# Patient Record
Sex: Male | Born: 1945
Health system: Southern US, Community
[De-identification: ages and names within clinical notes are randomized; demographics above are authoritative.]

## PROBLEM LIST (undated history)

## (undated) DIAGNOSIS — I10 Essential (primary) hypertension: Secondary | ICD-10-CM

## (undated) DIAGNOSIS — I82409 Acute embolism and thrombosis of unspecified deep veins of unspecified lower extremity: Secondary | ICD-10-CM

## (undated) DIAGNOSIS — O2230 Deep phlebothrombosis in pregnancy, unspecified trimester: Secondary | ICD-10-CM

## (undated) DIAGNOSIS — Z7901 Long term (current) use of anticoagulants: Secondary | ICD-10-CM

## (undated) DIAGNOSIS — I38 Endocarditis, valve unspecified: Secondary | ICD-10-CM

## (undated) DIAGNOSIS — I714 Abdominal aortic aneurysm, without rupture: Secondary | ICD-10-CM

## (undated) DIAGNOSIS — R0602 Shortness of breath: Secondary | ICD-10-CM

## (undated) DIAGNOSIS — D6861 Antiphospholipid syndrome: Secondary | ICD-10-CM

## (undated) DIAGNOSIS — G4733 Obstructive sleep apnea (adult) (pediatric): Secondary | ICD-10-CM

## (undated) DIAGNOSIS — I2510 Atherosclerotic heart disease of native coronary artery without angina pectoris: Secondary | ICD-10-CM

## (undated) DIAGNOSIS — I5022 Chronic systolic (congestive) heart failure: Secondary | ICD-10-CM

## (undated) DIAGNOSIS — I5020 Unspecified systolic (congestive) heart failure: Secondary | ICD-10-CM

## (undated) DIAGNOSIS — I429 Cardiomyopathy, unspecified: Secondary | ICD-10-CM

## (undated) DIAGNOSIS — K219 Gastro-esophageal reflux disease without esophagitis: Secondary | ICD-10-CM

## (undated) DIAGNOSIS — I499 Cardiac arrhythmia, unspecified: Secondary | ICD-10-CM

## (undated) DIAGNOSIS — N400 Enlarged prostate without lower urinary tract symptoms: Secondary | ICD-10-CM

## (undated) DIAGNOSIS — M1990 Unspecified osteoarthritis, unspecified site: Secondary | ICD-10-CM

## (undated) DIAGNOSIS — M069 Rheumatoid arthritis, unspecified: Secondary | ICD-10-CM

## (undated) DIAGNOSIS — C9110 Chronic lymphocytic leukemia of B-cell type not having achieved remission: Secondary | ICD-10-CM

## (undated) DIAGNOSIS — Z8719 Personal history of other diseases of the digestive system: Secondary | ICD-10-CM

## (undated) DIAGNOSIS — Z8619 Personal history of other infectious and parasitic diseases: Secondary | ICD-10-CM

## (undated) DIAGNOSIS — J189 Pneumonia, unspecified organism: Secondary | ICD-10-CM

## (undated) DIAGNOSIS — I700 Atherosclerosis of aorta: Secondary | ICD-10-CM

## (undated) DIAGNOSIS — I509 Heart failure, unspecified: Secondary | ICD-10-CM

## (undated) DIAGNOSIS — I739 Peripheral vascular disease, unspecified: Secondary | ICD-10-CM

## (undated) DIAGNOSIS — R7303 Prediabetes: Secondary | ICD-10-CM

## (undated) DIAGNOSIS — K317 Polyp of stomach and duodenum: Secondary | ICD-10-CM

## (undated) DIAGNOSIS — K2270 Barrett's esophagus without dysplasia: Secondary | ICD-10-CM

## (undated) DIAGNOSIS — E785 Hyperlipidemia, unspecified: Secondary | ICD-10-CM

## (undated) DIAGNOSIS — E538 Deficiency of other specified B group vitamins: Secondary | ICD-10-CM

## (undated) DIAGNOSIS — I214 Non-ST elevation (NSTEMI) myocardial infarction: Secondary | ICD-10-CM

## (undated) DIAGNOSIS — I219 Acute myocardial infarction, unspecified: Secondary | ICD-10-CM

## (undated) DIAGNOSIS — I4891 Unspecified atrial fibrillation: Secondary | ICD-10-CM

## (undated) HISTORY — DX: Chronic lymphocytic leukemia of B-cell type not having achieved remission: C91.10

## (undated) HISTORY — DX: Unspecified osteoarthritis, unspecified site: M19.90

## (undated) HISTORY — DX: Polyp of stomach and duodenum: K31.7

## (undated) HISTORY — DX: Deficiency of other specified B group vitamins: E53.8

## (undated) HISTORY — DX: Rheumatoid arthritis, unspecified: M06.9

## (undated) HISTORY — DX: Acute embolism and thrombosis of unspecified deep veins of unspecified lower extremity: I82.409

## (undated) HISTORY — PX: CORONARY ARTERY BYPASS GRAFT: SHX141

## (undated) HISTORY — DX: Shortness of breath: R06.02

## (undated) HISTORY — DX: Gastro-esophageal reflux disease without esophagitis: K21.9

## (undated) HISTORY — DX: Non-ST elevation (NSTEMI) myocardial infarction: I21.4

## (undated) HISTORY — PX: CHOLECYSTECTOMY: SHX55

## (undated) HISTORY — PX: INGUINAL HERNIA REPAIR: SUR1180

## (undated) HISTORY — DX: Essential (primary) hypertension: I10

## (undated) HISTORY — DX: Heart failure, unspecified: I50.9

## (undated) HISTORY — DX: Atherosclerotic heart disease of native coronary artery without angina pectoris: I25.10

## (undated) HISTORY — PX: HERNIA REPAIR: SHX51

## (undated) HISTORY — DX: Obstructive sleep apnea (adult) (pediatric): G47.33

## (undated) HISTORY — DX: Acute myocardial infarction, unspecified: I21.9

## (undated) HISTORY — DX: Peripheral vascular disease, unspecified: I73.9

## (undated) HISTORY — PX: ESOPHAGOGASTRODUODENOSCOPY: SHX1529

## (undated) HISTORY — DX: Barrett's esophagus without dysplasia: K22.70

## (undated) HISTORY — PX: ABDOMINAL AORTA STENT: SHX1108

## (undated) HISTORY — DX: Deep phlebothrombosis in pregnancy, unspecified trimester: O22.30

## (undated) HISTORY — DX: Abdominal aortic aneurysm, without rupture: I71.4

## (undated) HISTORY — DX: Chronic systolic (congestive) heart failure: I50.22

## (undated) HISTORY — DX: Hyperlipidemia, unspecified: E78.5

---

## 1995-11-14 HISTORY — PX: LEFT HEART CATH AND CORONARY ANGIOGRAPHY: CATH118249

## 2002-01-07 HISTORY — PX: CORONARY ARTERY BYPASS GRAFT: SHX141

## 2002-01-21 DIAGNOSIS — Z951 Presence of aortocoronary bypass graft: Secondary | ICD-10-CM

## 2002-01-21 HISTORY — PX: CORONARY ARTERY BYPASS GRAFT: SHX141

## 2002-01-21 HISTORY — DX: Presence of aortocoronary bypass graft: Z95.1

## 2002-11-23 ENCOUNTER — Other Ambulatory Visit: Payer: Self-pay

## 2004-01-03 ENCOUNTER — Ambulatory Visit: Payer: Self-pay | Admitting: Internal Medicine

## 2004-02-09 ENCOUNTER — Ambulatory Visit: Payer: Self-pay | Admitting: Internal Medicine

## 2004-06-28 ENCOUNTER — Ambulatory Visit: Payer: Self-pay | Admitting: Unknown Physician Specialty

## 2005-02-14 ENCOUNTER — Ambulatory Visit: Payer: Self-pay | Admitting: Internal Medicine

## 2006-02-25 ENCOUNTER — Ambulatory Visit: Payer: Self-pay | Admitting: Internal Medicine

## 2009-11-24 ENCOUNTER — Ambulatory Visit: Payer: Self-pay | Admitting: Unknown Physician Specialty

## 2009-11-24 HISTORY — PX: COLONOSCOPY: SHX174

## 2009-11-28 LAB — PATHOLOGY REPORT

## 2010-03-21 ENCOUNTER — Inpatient Hospital Stay: Payer: Self-pay | Admitting: Internal Medicine

## 2010-06-06 ENCOUNTER — Ambulatory Visit: Payer: Self-pay | Admitting: Internal Medicine

## 2010-06-27 ENCOUNTER — Ambulatory Visit: Payer: Self-pay | Admitting: Internal Medicine

## 2011-01-08 DIAGNOSIS — Z8619 Personal history of other infectious and parasitic diseases: Secondary | ICD-10-CM

## 2011-01-08 HISTORY — DX: Personal history of other infectious and parasitic diseases: Z86.19

## 2011-07-09 ENCOUNTER — Inpatient Hospital Stay: Payer: Self-pay | Admitting: Vascular Surgery

## 2011-07-09 LAB — URINALYSIS, COMPLETE
Glucose,UR: NEGATIVE mg/dL (ref 0–75)
Nitrite: NEGATIVE
Ph: 7 (ref 4.5–8.0)
Protein: NEGATIVE
RBC,UR: 2 /HPF (ref 0–5)
Specific Gravity: 1.023 (ref 1.003–1.030)
WBC UR: 1 /HPF (ref 0–5)

## 2011-07-09 LAB — CBC
HGB: 16.8 g/dL (ref 13.0–18.0)
MCH: 30.3 pg (ref 26.0–34.0)
MCV: 91 fL (ref 80–100)
Platelet: 169 10*3/uL (ref 150–440)
RBC: 5.52 10*6/uL (ref 4.40–5.90)
RDW: 14.2 % (ref 11.5–14.5)

## 2011-07-09 LAB — COMPREHENSIVE METABOLIC PANEL
Albumin: 3.8 g/dL (ref 3.4–5.0)
Alkaline Phosphatase: 69 U/L (ref 50–136)
Anion Gap: 8 (ref 7–16)
BUN: 17 mg/dL (ref 7–18)
Calcium, Total: 8.8 mg/dL (ref 8.5–10.1)
Co2: 29 mmol/L (ref 21–32)
EGFR (African American): >60
Glucose: 108 mg/dL — ABNORMAL HIGH (ref 65–99)
Osmolality: 280 (ref 275–301)
Potassium: 3.9 mmol/L (ref 3.5–5.1)
SGOT(AST): 32 U/L (ref 15–37)
SGPT (ALT): 31 U/L

## 2011-07-10 LAB — CBC WITH DIFFERENTIAL/PLATELET
Basophil #: 0.1 10*3/uL (ref 0.0–0.1)
Eosinophil #: 0.1 10*3/uL (ref 0.0–0.7)
Eosinophil %: 1.7 %
HCT: 45.2 % (ref 40.0–52.0)
Lymphocyte #: 2.5 10*3/uL (ref 1.0–3.6)
Lymphocyte %: 43.3 %
MCH: 30.3 pg (ref 26.0–34.0)
MCHC: 33.2 g/dL (ref 32.0–36.0)
Monocyte #: 0.5 x10 3/mm (ref 0.2–1.0)
Platelet: 148 10*3/uL — ABNORMAL LOW (ref 150–440)
RDW: 14.7 % — ABNORMAL HIGH (ref 11.5–14.5)

## 2011-07-10 LAB — COMPREHENSIVE METABOLIC PANEL
Albumin: 3.4 g/dL (ref 3.4–5.0)
Alkaline Phosphatase: 71 U/L (ref 50–136)
Anion Gap: 5 — ABNORMAL LOW (ref 7–16)
BUN: 11 mg/dL (ref 7–18)
Calcium, Total: 8.2 mg/dL — ABNORMAL LOW (ref 8.5–10.1)
Co2: 31 mmol/L (ref 21–32)
Creatinine: 1.12 mg/dL (ref 0.60–1.30)
Glucose: 91 mg/dL (ref 65–99)
Potassium: 3.9 mmol/L (ref 3.5–5.1)
SGOT(AST): 39 U/L — ABNORMAL HIGH (ref 15–37)
SGPT (ALT): 32 U/L
Sodium: 138 mmol/L (ref 136–145)
Total Protein: 6.8 g/dL (ref 6.4–8.2)

## 2011-07-10 LAB — MAGNESIUM: Magnesium: 1.9 mg/dL

## 2011-07-11 LAB — CBC WITH DIFFERENTIAL/PLATELET
Basophil %: 0.7 %
Eosinophil #: 0 10*3/uL (ref 0.0–0.7)
Eosinophil %: 0.1 %
HGB: 14.9 g/dL (ref 13.0–18.0)
Lymphocyte %: 19.5 %
MCHC: 33.9 g/dL (ref 32.0–36.0)
MCV: 91 fL (ref 80–100)
Monocyte %: 8.2 %
Neutrophil #: 6.4 10*3/uL (ref 1.4–6.5)
Neutrophil %: 71.5 %
RBC: 4.82 10*6/uL (ref 4.40–5.90)
RDW: 14.2 % (ref 11.5–14.5)
WBC: 9 10*3/uL (ref 3.8–10.6)

## 2011-07-11 LAB — BASIC METABOLIC PANEL
BUN: 11 mg/dL (ref 7–18)
Chloride: 103 mmol/L (ref 98–107)
Creatinine: 1.15 mg/dL (ref 0.60–1.30)
EGFR (Non-African Amer.): >60
Osmolality: 275 (ref 275–301)
Sodium: 138 mmol/L (ref 136–145)

## 2011-07-19 ENCOUNTER — Emergency Department: Payer: Self-pay | Admitting: Unknown Physician Specialty

## 2012-03-23 ENCOUNTER — Ambulatory Visit: Payer: Self-pay | Admitting: Unknown Physician Specialty

## 2012-03-26 LAB — PATHOLOGY REPORT

## 2013-01-07 HISTORY — PX: CORONARY ANGIOPLASTY WITH STENT PLACEMENT: SHX49

## 2013-05-02 ENCOUNTER — Inpatient Hospital Stay: Payer: Self-pay | Admitting: Internal Medicine

## 2013-05-02 DIAGNOSIS — I214 Non-ST elevation (NSTEMI) myocardial infarction: Secondary | ICD-10-CM

## 2013-05-02 HISTORY — DX: Non-ST elevation (NSTEMI) myocardial infarction: I21.4

## 2013-05-02 LAB — CBC
HCT: 49.1 % (ref 40.0–52.0)
HGB: 16.5 g/dL (ref 13.0–18.0)
MCH: 30 pg (ref 26.0–34.0)
MCHC: 33.5 g/dL (ref 32.0–36.0)
MCV: 90 fL (ref 80–100)
Platelet: 206 10*3/uL (ref 150–440)
RBC: 5.48 10*6/uL (ref 4.40–5.90)
RDW: 15.1 % — ABNORMAL HIGH (ref 11.5–14.5)
WBC: 6.7 10*3/uL (ref 3.8–10.6)

## 2013-05-02 LAB — BASIC METABOLIC PANEL
Anion Gap: 7 (ref 7–16)
BUN: 15 mg/dL (ref 7–18)
Calcium, Total: 8.6 mg/dL (ref 8.5–10.1)
Chloride: 104 mmol/L (ref 98–107)
Co2: 26 mmol/L (ref 21–32)
Creatinine: 0.66 mg/dL (ref 0.60–1.30)
EGFR (African American): >60
EGFR (Non-African Amer.): >60
GLUCOSE: 123 mg/dL — AB (ref 65–99)
Osmolality: 276 (ref 275–301)
Potassium: 3.5 mmol/L (ref 3.5–5.1)
SODIUM: 137 mmol/L (ref 136–145)

## 2013-05-02 LAB — CK-MB
CK-MB: 6.6 ng/mL — ABNORMAL HIGH (ref 0.5–3.6)
CK-MB: 10.1 ng/mL — AB (ref 0.5–3.6)
CK-MB: 9.9 ng/mL — ABNORMAL HIGH (ref 0.5–3.6)

## 2013-05-02 LAB — APTT
ACTIVATED PTT: 29.3 s (ref 23.6–35.9)
Activated PTT: 58.2 secs — ABNORMAL HIGH (ref 23.6–35.9)
Activated PTT: 56.4 secs — ABNORMAL HIGH (ref 23.6–35.9)

## 2013-05-02 LAB — PRO B NATRIURETIC PEPTIDE: B-TYPE NATIURETIC PEPTID: 137 pg/mL — AB (ref 0–125)

## 2013-05-02 LAB — TROPONIN I
TROPONIN-I: 2.2 ng/mL — AB
TROPONIN-I: 2.1 ng/mL — AB
Troponin-I: 0.92 ng/mL — ABNORMAL HIGH

## 2013-05-02 LAB — PROTIME-INR
INR: 1
Prothrombin Time: 13 secs (ref 11.5–14.7)

## 2013-05-03 DIAGNOSIS — F17200 Nicotine dependence, unspecified, uncomplicated: Secondary | ICD-10-CM | POA: Insufficient documentation

## 2013-05-03 DIAGNOSIS — I739 Peripheral vascular disease, unspecified: Secondary | ICD-10-CM | POA: Insufficient documentation

## 2013-05-03 DIAGNOSIS — Z720 Tobacco use: Secondary | ICD-10-CM | POA: Insufficient documentation

## 2013-05-03 DIAGNOSIS — E782 Mixed hyperlipidemia: Secondary | ICD-10-CM | POA: Insufficient documentation

## 2013-05-03 DIAGNOSIS — G4733 Obstructive sleep apnea (adult) (pediatric): Secondary | ICD-10-CM | POA: Insufficient documentation

## 2013-05-03 DIAGNOSIS — M069 Rheumatoid arthritis, unspecified: Secondary | ICD-10-CM | POA: Insufficient documentation

## 2013-05-03 DIAGNOSIS — R0602 Shortness of breath: Secondary | ICD-10-CM

## 2013-05-03 DIAGNOSIS — R079 Chest pain, unspecified: Secondary | ICD-10-CM

## 2013-05-03 DIAGNOSIS — M1990 Unspecified osteoarthritis, unspecified site: Secondary | ICD-10-CM | POA: Insufficient documentation

## 2013-05-03 HISTORY — PX: CORONARY ANGIOPLASTY WITH STENT PLACEMENT: SHX49

## 2013-05-03 LAB — BASIC METABOLIC PANEL
ANION GAP: 7 (ref 7–16)
BUN: 14 mg/dL (ref 7–18)
Calcium, Total: 8.8 mg/dL (ref 8.5–10.1)
Chloride: 101 mmol/L (ref 98–107)
Co2: 28 mmol/L (ref 21–32)
Creatinine: 0.87 mg/dL (ref 0.60–1.30)
Glucose: 128 mg/dL — ABNORMAL HIGH (ref 65–99)
Osmolality: 274 (ref 275–301)
Potassium: 3.3 mmol/L — ABNORMAL LOW (ref 3.5–5.1)
Sodium: 136 mmol/L (ref 136–145)

## 2013-05-03 LAB — CBC WITH DIFFERENTIAL/PLATELET
BASOS PCT: 0.9 %
Basophil #: 0.1 10*3/uL (ref 0.0–0.1)
Eosinophil #: 0 10*3/uL (ref 0.0–0.7)
Eosinophil %: 0.3 %
HCT: 44.4 % (ref 40.0–52.0)
HGB: 15 g/dL (ref 13.0–18.0)
Lymphocyte #: 2.2 10*3/uL (ref 1.0–3.6)
Lymphocyte %: 23.3 %
MCH: 30.3 pg (ref 26.0–34.0)
MCHC: 33.9 g/dL (ref 32.0–36.0)
MCV: 89 fL (ref 80–100)
MONO ABS: 0.6 x10 3/mm (ref 0.2–1.0)
MONOS PCT: 6.6 %
NEUTROS ABS: 6.4 10*3/uL (ref 1.4–6.5)
NEUTROS PCT: 68.9 %
Platelet: 193 10*3/uL (ref 150–440)
RBC: 4.97 10*6/uL (ref 4.40–5.90)
RDW: 15 % — AB (ref 11.5–14.5)
WBC: 9.3 10*3/uL (ref 3.8–10.6)

## 2013-05-03 LAB — APTT: ACTIVATED PTT: 65 s — AB (ref 23.6–35.9)

## 2013-05-03 LAB — LIPID PANEL
Cholesterol: 196 mg/dL (ref 0–200)
HDL: 42 mg/dL (ref 40–60)
Ldl Cholesterol, Calc: 121 mg/dL — ABNORMAL HIGH (ref 0–100)
TRIGLYCERIDES: 164 mg/dL (ref 0–200)
VLDL Cholesterol, Calc: 33 mg/dL (ref 5–40)

## 2013-05-03 LAB — MAGNESIUM: MAGNESIUM: 1.9 mg/dL

## 2013-05-04 DIAGNOSIS — K219 Gastro-esophageal reflux disease without esophagitis: Secondary | ICD-10-CM | POA: Insufficient documentation

## 2013-05-14 DIAGNOSIS — I214 Non-ST elevation (NSTEMI) myocardial infarction: Secondary | ICD-10-CM | POA: Insufficient documentation

## 2014-02-13 ENCOUNTER — Emergency Department: Payer: Self-pay | Admitting: Emergency Medicine

## 2014-02-13 LAB — PROTIME-INR
INR: 1
Prothrombin Time: 13.2 secs

## 2014-02-13 LAB — BASIC METABOLIC PANEL
Anion Gap: 6 — ABNORMAL LOW (ref 7–16)
BUN: 13 mg/dL (ref 7–18)
CALCIUM: 8.4 mg/dL — AB (ref 8.5–10.1)
CO2: 26 mmol/L (ref 21–32)
Chloride: 106 mmol/L (ref 98–107)
Creatinine: 0.83 mg/dL (ref 0.60–1.30)
EGFR (African American): >60
Glucose: 98 mg/dL (ref 65–99)
Osmolality: 276 (ref 275–301)
Potassium: 4.1 mmol/L (ref 3.5–5.1)
SODIUM: 138 mmol/L (ref 136–145)

## 2014-02-13 LAB — CBC
HCT: 42.5 % (ref 40.0–52.0)
HGB: 13.7 g/dL (ref 13.0–18.0)
MCH: 27.5 pg (ref 26.0–34.0)
MCHC: 32.3 g/dL (ref 32.0–36.0)
MCV: 85 fL (ref 80–100)
Platelet: 228 10*3/uL (ref 150–440)
RBC: 4.98 10*6/uL (ref 4.40–5.90)
RDW: 17.2 % — ABNORMAL HIGH (ref 11.5–14.5)
WBC: 9.4 10*3/uL (ref 3.8–10.6)

## 2014-02-13 LAB — APTT: Activated PTT: 28.8 secs (ref 23.6–35.9)

## 2014-02-16 ENCOUNTER — Ambulatory Visit: Payer: Self-pay | Admitting: Vascular Surgery

## 2014-04-05 DIAGNOSIS — I714 Abdominal aortic aneurysm, without rupture, unspecified: Secondary | ICD-10-CM | POA: Insufficient documentation

## 2014-04-05 DIAGNOSIS — I2510 Atherosclerotic heart disease of native coronary artery without angina pectoris: Secondary | ICD-10-CM | POA: Insufficient documentation

## 2014-04-05 DIAGNOSIS — R0681 Apnea, not elsewhere classified: Secondary | ICD-10-CM | POA: Insufficient documentation

## 2014-04-05 HISTORY — DX: Abdominal aortic aneurysm, without rupture, unspecified: I71.40

## 2014-04-05 HISTORY — DX: Atherosclerotic heart disease of native coronary artery without angina pectoris: I25.10

## 2014-04-05 HISTORY — DX: Abdominal aortic aneurysm, without rupture: I71.4

## 2014-04-19 DIAGNOSIS — I5022 Chronic systolic (congestive) heart failure: Secondary | ICD-10-CM

## 2014-04-19 HISTORY — DX: Chronic systolic (congestive) heart failure: I50.22

## 2014-04-30 NOTE — Consult Note (Signed)
   Present Illness 69 yo male with history of cad s/p cabg in 1/04 with a lima to lad, svg to d1, om1 and rca. Had a nstemi in 2012 with stent placement in rca. Had occlusion of svg to rca and om2. Has hisotyr of hypertension , nyperlipidemia and aaa. Was doing well with medical managment until the past 24 hours when he developed burning pain in his left lateral chest. EKG on presentation was unremarkable for injury current . He has ruled in for a nstemi with serum troponin of 2.2. He has been compliant with his meds including losartan-hctz, benazepril, amlodipine, clopidogrel, metoprolol and asa   Physical Exam:  GEN no acute distress   HEENT PERRL, hearing intact to voice   NECK supple   RESP normal resp effort  clear BS   CARD Regular rate and rhythm  Normal, S1, S2   ABD denies tenderness  no Adominal Mass   LYMPH negative neck, negative axillae   EXTR negative cyanosis/clubbing, negative edema   SKIN normal to palpation   NEURO cranial nerves intact, motor/sensory function intact   PSYCH A+O to time, place, person   Review of Systems:  Subjective/Chief Complaint left sided chest pain similar to his angina   General: No Complaints   Skin: No Complaints   ENT: No Complaints   Eyes: No Complaints   Neck: No Complaints   Respiratory: No Complaints   Cardiovascular: Chest pain or discomfort   Gastrointestinal: No Complaints   Genitourinary: No Complaints   Vascular: No Complaints   Musculoskeletal: No Complaints   Neurologic: No Complaints   Hematologic: No Complaints   Endocrine: No Complaints   Psychiatric: No Complaints   Review of Systems: All other systems were reviewed and found to be negative   Medications/Allergies Reviewed Medications/Allergies reviewed   Family & Social History:  Family and Social History:  Family History Non-Contributory   Social History negative tobacco, negative Illicit drugs   EKG:  EKG NSR   Abnormal NSSTTW  changes    Penicillin: Rash   Impression 69 yo male with histoyr of cad s/p cabg x 4 with lima to lad, svg to 66m, d1, rca . SVG to rca in 2012 with occluded svg to om and rca. Now with chest pain and has ruled in for nstemi. Risk and benefits of left cardiac cth explained to patient and he agrees to proceed. Conitnue with currrent meds but will add topical nitrates.l   Plan 1. Conitnue current meds including asa and plavix 2. Add topical nitrates 3. Risk and benefits of cardiac cath explained to patient and family 4. Proceed iwth left cardaic cath in am 5. Further recs after cath   Electronic Signatures: Teodoro Spray (MD)  (Signed 26-Apr-15 10:45)  Authored: General Aspect/Present Illness, History and Physical Exam, Review of System, Family & Social History, EKG , Allergies, Impression/Plan   Last Updated: 26-Apr-15 10:45 by Teodoro Spray (MD)

## 2014-04-30 NOTE — H&P (Signed)
PATIENT NAME:  Martin Adkins, [name] MR#:  [id number] DATE OF BIRTH:  28-Oct-1945  DATE OF ADMISSION:  05/02/2013  PRIMARY CARE PHYSICIAN: Dr. Lisette Grinder, M.D.   REFERRING PHYSICIAN: Dr. Dahlia Client.   CHIEF COMPLAINT: Chest pain.   HISTORY OF PRESENT ILLNESS: The patient is a 69 year old male with a past medical history of coronary artery disease status post CABG several years ago, status post stent placement two years ago and stress test done approximately 2 years ago, which was normal, as reported by the patient,  hypertension, rheumatoid arthritis on methotrexate, history of AAA status post repair,  is presenting to the ER with a chief complaint of chest pain. The patient is reporting that his pain started at 5:00 a.m. today. It was a burning type of pain with chest tightness and shortness of breath associated with nausea, but denies any sweating, vomiting. Denies any lightheadedness. It is constant but the patient did not try taking any medications, neither aspirin or nitroglycerin. The patient waited until today morning, 5:00 a.m. as the pain was not getting better, he came to the ER. In the ER, the patient's troponin is elevated at 0.92. EKG revealed T wave inversions in leads V2 and V3. The patient was given aspirin, nitroglycerin and started on heparin drip. Hospitalist team is called to admit the patient. The patient is resting comfortably during my examination and started feeling better. He said he recently had a stress test done by Dr. Nehemiah Massed approximately one and a half to two years ago, which was normal. He denies any other complaints.  No family members at bedside.   PAST MEDICAL HISTORY: Hypertension, hyperlipidemia, rheumatoid arthritis on methotrexate, history of coronary artery disease status post myocardial infarction, history of AAA status post repair.History of DVT in the past, not on any anticoagulation right now. Tobacco abuse.   PAST SURGICAL HISTORY: Status post bypass grafting, AAA  repair.  ALLERGIES: PENICILLIN.   PSYCHOSOCIAL HISTORY: Lives at home, lives alone. Smokes 3 to 4 cigarettes per day. Occasional intake of alcohol. Denies any illicit drug usage.   FAMILY HISTORY: Father deceased with heart attack and he had hypertension as well.   HOME MEDICATIONS: Tamsulosin 0.1 mg once daily, potassium chloride once daily, metoprolol tartrate 20 mg p.o. once a day, hydrochlorothiazide 0.5 mg once a day, Enbrel 50 mg subcutaneously, Plavix 75 mg once a day, aspirin 81 mg once a day.  REVIEW OF SYSTEMS:   CONSTITUTIONAL: Denies fever, fatigue, weakness.  EYES: Denies blurry vision, double vision.  ENT: Denies epistaxis, discharge.  RESPIRATION: Denies cough, COPD. CARDIOVASCULAR: Complaining of chest pain. Denies palpitations, syncope.  GASTROINTESTINAL:  Denies nausea, vomiting, diarrhea, abdominal pain.  GENITOURINARY: No dysuria or hematuria.  ENDOCRINE: Denies polyuria, nocturia, thyroid problems.  HEMATOLOGIC AND LYMPHATIC: No anemia, easy bruising, bleeding.  INTEGUMENTARY: No acne, rash, lesions.  MUSCULOSKELETAL: No joint pain in the neck, has rheumatoid arthritis, osteoarthritis.  NEUROLOGICAL: Denies vertigo, ataxia.  PSYCHIATRIC: No ADD, OCD, insomnia.   PHYSICAL EXAMINATION: VITAL SIGNS: Temperature 98.6, pulse 86 , respirations 18, blood pressure 117/81, pulse oximetry 96% on 2 liters.  GENERAL APPEARANCE: Not under acute distress. Moderately built and nourished.  HEENT: Normocephalic, atraumatic. Pupils are equally reacting to light and accommodation. No scleral icterus. No conjunctival injection. No sinus tenderness. No postnasal drip.  NECK: Supple. No JVD, no thyromegaly. Range of motion is intact.  LUNGS: Clear to auscultation bilaterally. No accessory muscle use and no anterior chest wall tenderness on palpation.  CARDIAC: S1, S2 normal. Regular  rate and rhythm. No murmurs. No gallops. No clicks.  GASTROINTESTINAL: Soft. Bowel sounds are positive  in all four quadrants. Nontender, nondistended. No hepatosplenomegaly. No masses are noted.  NEUROLOGICAL: Awake, oriented x 3. Cranial nerves II through XII are grossly intact. Motor and sensory are intact. Reflexes are 2+.  EXTREMITIES: No edema. No cyanosis. No clubbing.   SKIN: Warm to touch. Normal turgor. No rashes. No lesions.  MUSCULOSKELETAL: No joint effusion, tenderness, erythema.  PSYCHIATRIC: Normal mood and affect.   LABORATORIES AND IMAGING STUDIES: CPK-MB 6.6. Troponin 0.92, CBC normal, PT-INR normal. Chem-8: Glucose is 123 BNP 137. The rest of the Chem-8 is normal.   A 12-lead EKG has revealed sinus rhythm at 85 beats per minute, left atrial enlargement,  T-wave inversions from leads V2 and V3. Chest x-ray, portable chest x-ray has revealed no acute cardiopulmonary process.   ASSESSMENT AND PLAN: A 69 year old male presenting to the ER with a chief complaint of chest pain started yesterday morning with a past medical history of coronary artery disease status post coronary artery bypass grafting, will be admitted with the following assessment and plan.  1. Chest pain most probably non-STEMI. We will admit him to telemetry. Cycle cardiac biomarkers, we will implement ACS protocol. Cardiology consult is placed to Dr. Nehemiah Massed.  2. Heparin. The patient is currently on heparin drip.  3. History of hypertension. Resume home medications and up titrate medications as needed basis.  4. History of rheumatoid arthritis. The patient reports that he is on methotrexate. We will resume that.  5. History of abdominal aortic aneurysm status post repair. The patient denies any back pain, abdominal pain at this time, stable.  6. We will provide him gastrointestinal prophylaxis. Deep vein thrombosis prophylaxis is not needed as the patient is on heparin drip.   The diagnosis and plan of care was discussed in detail with the patient. He verbalized understanding of the plan. He is full code. Daughter  is the medical power of attorney.   TOTAL TIME SPENT ON ADMISSION: Is 50 minutes.   Transfer the patient to Dr. Gilford Rile.    ____________________________ Nicholes Mango, MD ag:sg D: 05/02/2013 07:37:49 ET T: 05/02/2013 09:55:12 ET JOB#: [id number]  cc: Nicholes Mango, MD, <Dictator> Nicholes Mango MD ELECTRONICALLY SIGNED 05/12/2013 7:51

## 2014-05-01 NOTE — Op Note (Signed)
PATIENT NAME:  Martin Adkins, Martin Adkins MR#:  [id number] DATE OF BIRTH:  [birth year]  DATE OF PROCEDURE:  07/10/2011  PREOPERATIVE DIAGNOSES:  1. Symptomatic abdominal aortic aneurysm with abdominal and flank pain. 2. Coronary artery disease.  3. Hypertension.   POSTOPERATIVE DIAGNOSES:  1. Symptomatic abdominal aortic aneurysm with abdominal and flank pain. 2. Coronary artery disease.  3. Hypertension.   PROCEDURES PERFORMED: 1. Bilateral femoral artery cutdowns for placement of endoprosthesis, right by Dr. Delana Meyer and left by Dr. Lucky Cowboy. 2. Catheter placement into aorta from bilateral femoral approaches, right by Dr. Delana Meyer and left by Dr. Lucky Cowboy. 3. Placement of a Gore Excluder endoprosthesis main body right, 28 mm diameter proximal, 14 cm in length x 14 mm in diameter ipsilateral right, with an 18 mm diameter x 12 cm in length contralateral limb, left.  SURGEON: Leotis Pain, MD  CO-SURGEON: Hortencia Pilar, MD  ANESTHESIA: General.   ESTIMATED BLOOD LOSS: Approximately 50 mL.  FLUOROSCOPY TIME: Approximately five minutes.  CONTRAST USED: 46 mL.   INDICATION FOR PROCEDURE: This is a gentleman who had has an abdominal aortic aneurysm which has grown approximately 5 mm over the last year or two and is now just under 5 cm in maximal diameter. He presented to the Emergency Room with right flank pain and had no other clear signs of his pain. His stone study was normal. His urinalysis was normal. He had no white count or fever, no signs of appendicitis, and he had already had a cholecystectomy. For these reasons, it was felt that his aneurysm could be causing the pain and repair was indicated. The risks and benefits were discussed and informed consent was obtained.   DESCRIPTION OF PROCEDURE: The patient was brought to the vascular interventional radiology suite. Anesthesia provided a general anesthetic. His abdomen and groins were sterilely prepped and draped and a sterile surgical field was created.  Dr. Delana Meyer performed a cutdown overlying the right femoral artery and I performed similarly on the left. The femoral arteries were dissected out and encircled with vessel loops proximally and distally, and the patient was given 6000 units of intravenous heparin for systemic anticoagulation. 8 French sheaths were placed bilaterally. Dr. Delana Meyer placed a pigtail catheter in the aorta from the right femoral approach for the initial aortogram. This showed single renal vessels bilaterally with the configuration we expected from the CT angiogram. He placed the main body device up the right side through an 18 French sheath, and I put a Kumpe catheter at the level of the renal arteries. A magnified image was performed and the Kumpe catheter was then pulled back and Dr. Delana Meyer deployed the device, at the base of the right renal artery, which was slightly lower. I then cannulated the contralateral gate without difficulty with the Kumpe catheter and stiff angled Glidewire and confirmed successful cannulation by twirling a pigtail catheter in the main body. Retrograde arteriogram was performed with a stiff wire and the pigtail catheter and the contralateral limb was then selected and deployed from the left side just above the hypogastric artery, after I placed an 18 French sheath. The Coda balloon was used to iron out all junction points and seal zones and completion angiogram was then performed. This displayed an excellent location of the endoprosthesis just below the renal arteries and just above the hypogastric arteries with no endoleaks present and good flow through the stent graft. At this point, we elected to terminate the procedure, both sheaths were removed, control was obtained in the groins,  the femoral arteries were closed with interrupted 6-0 Prolene sutures, and the groins were closed in layered fashion with two layers of 2-0 Vicryl, two layers of 3-0 Vicryl, and a 4-0 Monocryl. Dermabond was placed as a  dressing. The patient tolerated the procedure well and was taken to the recovery room in stable condition.  ____________________________ Algernon Huxley, MD jsd:slb D: 07/10/2011 10:37:23 ET    T: 07/10/2011 11:34:36 ET        JOB#: [id number] cc: Algernon Huxley, MD, <Dictator> Algernon Huxley MD ELECTRONICALLY SIGNED 07/11/2011 15:54

## 2014-05-01 NOTE — Discharge Summary (Signed)
PATIENT NAME:  Martin Adkins, Martin Adkins MR#:  [id number] DATE OF BIRTH:  Mar 07, 1945  DATE OF ADMISSION:  07/09/2011 DATE OF DISCHARGE:  07/11/2011  ADMISSION DIAGNOSIS: Right flank and back pain with abdominal aortic aneurysm representing a possible symptomatic aneurysm.   DISCHARGE DIAGNOSIS: Right flank and back pain with abdominal aortic aneurysm representing a possible symptomatic aneurysm.   BRIEF HISTORY: This is a 69 year old male who was admitted with right flank and back pain. He had an abdominal aortic aneurysm which had grown significantly since his last evaluation and now approached 5 cm in maximal diameter.  Due to the unclear nature of the pain, it is felt this could be a symptomatic aneurysm, although musculoskeletal causes of the pain were also considered  We recommended repair. He had a preoperative clearance performed by Dr. Saralyn Pilar in Cardiology and was admitted on the second and prepared for surgery the next day.Marland Kitchen   HOSPITAL COURSE: The patient was admitted to the hospital and prepared for surgery. His preoperative evaluation was completed. On 07/10/2011, he was taken to the Operating Room where an endovascular abdominal aortic aneurysm repair was performed. He did well from this. He did still have some pain on postoperative day one, but his vital signs were stable. His groins were clean, dry and intact, and he had adequate labs and physical exam for discharge. He was discharged home accompanied by his family.   DISCHARGE INSTRUCTIONS:  1. His diet is regular.  2. His activity is as tolerated.  3. He was given a prescription for Percocet as needed for pain.  4. He may resume all of his previous home medications.  5. He will see [place] in the office in 3 to 4 weeks.  6. He is going to have an outpatient appointment with his rheumatologist, Dr. Jefm Bryant, for his arthritis.      7. He will call us for any problems in the interim.  ____________________________ Algernon Huxley,  MD jsd:cbb D: 07/22/2011 12:01:23 ET T: 07/22/2011 12:24:44 ET JOB#: [id number]  cc: Algernon Huxley, MD, <Dictator> Algernon Huxley MD ELECTRONICALLY SIGNED 07/22/2011 16:28

## 2014-05-01 NOTE — Op Note (Signed)
PATIENT NAME:  Martin Adkins, Martin Adkins MR#:  [id number] DATE OF BIRTH:  17-Nov-1945  DATE OF PROCEDURE:  07/10/2011  PREOPERATIVE DIAGNOSIS: Symptomatic abdominal aortic aneurysm.   POSTOPERATIVE DIAGNOSIS: Symptomatic abdominal aortic aneurysm.  PROCEDURE PERFORMED:   1. Endovascular repair of the abdominal aortic aneurysm using the Gore Excluder stent graft.  2. Abdominal aortogram, right groin approach.  3. Abdominal aortogram, left groin approach.  4. Bilateral open femoral artery cutdowns.   COSURGEON:  Katha Cabal, MD   COSURGEON: Leotis Pain, MD   ANESTHESIA: General by endotracheal intubation.   FLUIDS: Per anesthesia record.   ESTIMATED BLOOD LOSS: Minimal.   SPECIMEN: None.   INDICATIONS: Martin Adkins is a 69 year old gentleman who presented to the ER with increasing and very intense back pain and abdominal pain. Extensive work-up demonstrated his abdominal aortic aneurysm had grown to 4.9 cm, last recorded measurement was 4.4. No other etiologies are apparent, and therefore he is presumed to have a symptomatic aneurysm and is undergoing urgent repair. The risks and benefits were reviewed. All questions are answered. The patient agrees to proceed.   DESCRIPTION OF PROCEDURE: The patient is taken to Special Procedures and placed in the supine position. After adequate general anesthesia is induced, appropriate invasive monitors are placed, both groins are prepped and draped in sterile fashion. Vertical incisions are then made on the right and left. With myself working on the right and Dr. Lucky Cowboy working on the left, the dissection is carried down through the soft tissues opening the femoral sheath and exposing the femoral artery. The femoral artery is looped proximally and distally bilaterally. 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   Again working simultaneously, Dr. Lucky Cowboy and myself, a Seldinger needle is inserted into the anterior wall of the femoral artery. A J-wire is  then advanced, followed by placement of an 8-French sheath.  J-wires are then advanced up both the right and left groins, and a KMP catheter is advanced by Dr. Lucky Cowboy up the left, a pigtail catheter up the right. Bolus injection of contrast is then performed through the pigtail catheter with the markers positioned, and measurements are made from the level of the renal arteries to the bifurcation and subsequently down to the right iliac bifurcation. Heparin, 6000 units, is given and allowed to circulate. An Amplatz Superstiff Wire is then advanced up the pigtail catheter from the right side. The 8-French sheath is exchanged for an 18-French DrySeal Sheath, and a 28 x 14 x 14 main body Gore Excluder is opened onto the field and prepped. It is then advanced up the right side. It is positioned at the level of the renals, and bolus injection of contrast through the KMP catheter from the left side is used to demonstrate the abdominal aorta and level of the renals. Verifying graft placement, the graft is then opened without difficulty and deployed in the infrarenal location. The gate is manipulated so that it is in the anterior right lateral projection. The KMP catheter is pulled back into the distal sac prior to opening the main body. Using a Glidewire and KMP catheter, Dr. Lucky Cowboy engage the gate. The catheter and wire are then advanced. The KMP is exchanged for a pigtail, and the is twirled in the main body confirming intraluminal placement. The Amplatz Superstiff Wire is then advanced through the pigtail catheter. An 18-French  sheath is then exchanged for the 8-French  sheath from the left, and an 18 x 11.5 contralateral limb stent is opened and prepped onto  the field. Oblique view of the pelvis had been obtained to mark the left iliac bifurcation for final length measurements. The contralateral limb is then deployed by Dr. Lucky Cowboy without difficulty. A Coda balloon is then used up both right and left sides to seal the proximal  as well as the contralateral limbs and the distal endpoints.   The pigtail catheter is then advanced up the right, positioned just above the stent graft, and bolus injection of contrast with delayed runtime is then obtained. No endoleaks are noted. There is excellent seal at both proximal and distal endpoints and no type 2 endoleak is identified either.   Both sheaths and wires were removed. The femoral arteries are clamped and then working simultaneously, Dr. Lucky Cowboy and myself, the arteriotomies are repaired with interrupted 6-0 Prolene. The wounds are then irrigated.  Surgicel is placed across the suture line, and the groins are closed in multiple layers using 2-0 and 3-0 Vicryl sutures; 4-0 Monocryl is used to close the skin and Dermabond.    The patient tolerated the procedure well. There were no immediate complications. Sponge and needle counts are correct, and he is taken to the recovery room in stable condition.  ____________________________ Katha Cabal, MD ggs:cbb D: 07/10/2011 11:12:45 ET T: 07/10/2011 11:38:34 ET JOB#: [id number]  cc: Katha Cabal, MD, <Dictator> John B. Sarina Ser, MD Corey Skains, MD Katha Cabal MD ELECTRONICALLY SIGNED 07/16/2011 12:23

## 2014-05-01 NOTE — Consult Note (Signed)
PATIENT NAME:  Martin Adkins, Martin Adkins MR#:  [id number] DATE OF BIRTH:  29-Nov-1945  DATE OF CONSULTATION:  07/09/2011  REFERRING PHYSICIAN:  Dr. Delana Meyer  CONSULTING PHYSICIAN:  Isaias Cowman, MD  CHIEF COMPLAINT: Abdominal discomfort.   HISTORY OF PRESENT ILLNESS: Patient is a 69 year old gentleman referred for preoperative cardiovascular evaluation prior to abdominal aortic aneurysm repair. Patient has prior history of coronary artery disease status post bypass graft surgery, hypertension and rheumatoid arthritis. Patient presented with abdominal discomfort with known abdominal aortic aneurysm and is scheduled for aneurysm repair on 07/10/2011. Patient currently denies chest pain or shortness of breath.   PAST MEDICAL HISTORY:  1. Status post coronary artery bypass graft surgery at [facility].  2. Hypertension.  3. Known abdominal aortic aneurysm.  4. Rheumatoid arthritis.   MEDICATIONS: 1. Aspirin 325 mg daily.  2. Plavix 75 mg daily.  3. Benazepril 10 mg daily.  4. Metoprolol tartrate 25 mg daily.  5. Amlodipine 5 mg daily.  6. Hydrochlorothiazide/losartan 25/100, 1 daily.  7. Enbrel 50 mg/mL subcutaneous solution weekly.   SOCIAL HISTORY: Patient is separated. He continues to smoke, smokes around five cigarettes a day.   FAMILY HISTORY: Positive for coronary artery disease.    REVIEW OF SYSTEMS: CONSTITUTIONAL: No fever or chills. EYES: No blurry vision. EARS: No hearing loss. RESPIRATORY: No shortness of breath. CARDIOVASCULAR: No chest pain. GASTROINTESTINAL: No nausea, vomiting or constipation. Patient had some mild diarrhea. GENITOURINARY: No dysuria, hematuria. MUSCULOSKELETAL: No arthralgias, myalgias. NEUROLOGICAL: No focal muscle weakness or numbness. PSYCHOLOGICAL: No depression or anxiety.   PHYSICAL EXAMINATION:  VITAL SIGNS: Blood pressure 159/92, pulse 56, respirations 20, temperature 99.7, pulse oximetry 96%.   HEENT: Pupils equal, reactive to  light and accommodation.   NECK: Supple without thyromegaly.   LUNGS: Clear.   CARDIOVASCULAR: Normal jugular venous pressure. Normal point of maximal impulse. Regular rate, rhythm. Normal S1, S2. No appreciable gallop, murmur, rub.   ABDOMEN: Soft and nontender. Pulses were intact bilaterally.   MUSCULOSKELETAL: Normal muscle tone.   NEUROLOGIC: Patient is alert and oriented x3. Motor and sensory both grossly intact.   IMPRESSION: 69 year old gentleman with known abdominal aortic aneurysm presents with abdominal discomfort, awaiting abdominal aortic aneurysm repair. Patient has known coronary artery disease status post prior bypass graft surgery, currently without chest pain.   RECOMMENDATIONS:  1. Agree with current therapy.  2. Would resume metoprolol tartrate pre-, peri- and postoperatively.  3. Proceed with abdominal aortic aneurysm surgery repair.  4. Would defer further cardiac diagnostics at this time.   ____________________________ Isaias Cowman, MD ap:cms D: 07/09/2011 13:36:39 ET T: 07/09/2011 14:15:56 ET  JOB#: [id number] cc: Isaias Cowman, MD, <Dictator> Isaias Cowman MD ELECTRONICALLY SIGNED 07/18/2011 8:36

## 2014-05-01 NOTE — Consult Note (Signed)
Brief Consult Note: Diagnosis: AAA, pre-op. No CP or SOB.   Patient was seen by consultant.   Consult note dictated.   Comments: REC  Proceed with surgery, no further cardiac diagnostics at this time.  Electronic Signatures: Isaias Cowman (MD)  (Signed 02-Jul-13 13:32)  Authored: Brief Consult Note   Last Updated: 02-Jul-13 13:32 by Isaias Cowman (MD)

## 2014-05-01 NOTE — H&P (Signed)
Subjective/Chief Complaint Abdominal pain/AAA    History of Present Illness 69 year old male that presented to the ER earlier this day with right flank pain and abdominal pain.  The pain had been ongoing for several days increasing in intensity.  It is radiating to both the abdomen and the back.  Work up in the ER is nonilluminating except for AAA.  It is associated with nausea, no significant vomitting.  No fever or chills.  No changes in bowel habbits.  No history of renal disease.  The patient was followed in the past by Dr Hulda Humphrey for the AAA but has not had an evaluation in about one year.    Past History 1. Coronary Artery Disease       Hx of PTA and stent       Hx of CABG 2.  Hypertension 3.  Hyperlipidemia 4.  Psoriatic arthritis   Past Med/Surgical Hx:  Cardiac stents:   Arthritis:   HTN:   Gall Bladder: removed  Hernia Repair:   DVT:   Cardiac bypass 2003:   ALLERGIES:  Penicillin: Rash  HOME MEDICATIONS: Medication Instructions Status  benazepril 20 mg oral tablet 1/2 pill  orally  once a day Active  Plavix 75 mg oral tablet 1  orally  once a day Active  aspirin    364m orally once a day Active  clopidogrel 75 mg oral tablet 1 tab(s) orally once a day Active  metoprolol tartrate 25 mg oral tablet 1 tab(s) orally once a day Active  amlodipine 5 mg oral tablet 1 tab(s) orally once a day Active  hydrochlorothiazide-losartan 25 mg-100 mg oral tablet 1 tab(s) orally once a day Active  Enbrel SureClick 50 mg/mL subcutaneous solution 50 milligram(s) subcutaneous once a week Active   Family and Social History:   Family History Non-Contributory    Social History negative tobacco, negative ETOH, negative Illicit drugs    Place of Living Home   Review of Systems:   Subjective/Chief Complaint flank pain    Fever/Chills No    Cough No    Sputum No    Abdominal Pain Yes    Diarrhea No    Constipation No    Nausea/Vomiting Yes    SOB/DOE No    Chest Pain  No    Telemetry Reviewed NSR    Dysuria No   Physical Exam:   GEN well developed, well nourished    HEENT pink conjunctivae, PERRL, hearing intact to voice    NECK supple  trachea midline    RESP normal resp effort  no use of accessory muscles    CARD regular rate  No LE edema  no JVD    ABD denies tenderness  soft  normal BS    EXTR negative cyanosis/clubbing, negative edema    SKIN normal to palpation, positive rashes, skin turgor poor    NEURO cranial nerves intact, follows commands, motor/sensory function intact    PSYCH alert, A+O to time, place, person, good insight   Lab Results: Hepatic:  02-Jul-13 01:47    Bilirubin, Total 0.7   Alkaline Phosphatase 69   SGPT (ALT) 31 (12-78 NOTE: NEW REFERENCE RANGE 11/30/2010)   SGOT (AST) 32   Total Protein, Serum 7.8   Albumin, Serum 3.8  Routine BB:  02-Jul-13 11:43    ABO Group + Rh Type A Positive   Antibody Screen NEGATIVE (Result(s) reported on 09 Jul 2011 at 02:09PM.)  Cardiology:  02-Jul-13 01:56    Ventricular  Rate 66   Atrial Rate 66   P-R Interval 150   QRS Duration 84   QT 440   QTc 461   P Axis 60   R Axis 61   T Axis 78   ECG interpretation Sinus rhythm with occasional Premature ventricular complexes and Premature atrial complexes Nonspecific ST abnormality Abnormal ECG When compared with ECG of 23-Mar-2010 10:15, Premature ventricular complexes are now Present Premature atrial complexes are now Present T wave inversion now evident in Anterior leads ----------unconfirmed---------- Confirmed by OVERREAD, NOT ([id number]), editor PEARSON, BARBARA ([id number]) on 07/09/2011 9:03:45 AM  Routine Chem:  02-Jul-13 01:47    Glucose, Serum  108   BUN 17   Creatinine (comp) 1.03   Sodium, Serum 139   Potassium, Serum 3.9   Chloride, Serum 102   CO2, Serum 29   Calcium (Total), Serum 8.8   Osmolality (calc) 280   eGFR (African American) >60   eGFR (Non-African American) >60 (eGFR values <74m/min/1.73 m2 may be  an indication of chronic kidney disease (CKD). Calculated eGFR is useful in patients with stable renal function. The eGFR calculation will not be reliable in acutely ill patients when serum creatinine is changing rapidly. It is not useful in  patients on dialysis. The eGFR calculation may not be applicable to patients at the low and high extremes of body sizes, pregnant women, and vegetarians.)   Anion Gap 8   Lipase 199 (Result(s) reported on 09 Jul 2011 at 02:17AM.)  Routine UA:  02-Jul-13 01:47    Color (UA) Yellow   Clarity (UA) Clear   Glucose (UA) Negative   Bilirubin (UA) Negative   Ketones (UA) Negative   Specific Gravity (UA) 1.023   Blood (UA) Negative   pH (UA) 7.0   Protein (UA) Negative   Nitrite (UA) Negative   Leukocyte Esterase (UA) Negative (Result(s) reported on 09 Jul 2011 at 02:30AM.)   RBC (UA) 2 /HPF   WBC (UA) 1 /HPF   Bacteria (UA) NONE SEEN   Epithelial Cells (UA) NONE SEEN  Result(s) reported on 09 Jul 2011 at 02:30AM.  Routine Hem:  02-Jul-13 01:47    WBC (CBC) 6.3   RBC (CBC) 5.52   Hemoglobin (CBC) 16.8   Hematocrit (CBC) 50.3   Platelet Count (CBC) 169 (Result(s) reported on 09 Jul 2011 at 02:13AM.)   MCV 91   MCH 30.3   MCHC 33.3   RDW 14.2     Assessment/Admission Diagnosis 1.  Abdominal pain/AAA          given the lack of other etiologies it is reasonable that his AAA is symptomatic.         I will order a contrasted CT but based on the noncontrasted CT scan he is a good candidate for endograft. 2.  Coronary artery disease         He is followed by Dr KNehemiah Massed         I will ask him to see the patient preoperatively. 3.  Hypertension         continue home meds changes per cardiology 4.  Psoriatic arthritis         hold embrel 5.  Gastritis/peptic ulcer disease         begin PPI    Plan level 3 admit   Electronic Signatures: SHortencia Pilar(MD)  (Signed 02-Jul-13 19:31)  Authored: CHIEF COMPLAINT and HISTORY, PAST  MEDICAL/SURGIAL HISTORY, ALLERGIES, HOME MEDICATIONS, FAMILY AND SOCIAL HISTORY, REVIEW OF SYSTEMS,  PHYSICAL EXAM, LABS, ASSESSMENT AND PLAN   Last Updated: 02-Jul-13 19:31 by Hortencia Pilar (MD)

## 2014-05-02 DIAGNOSIS — I10 Essential (primary) hypertension: Secondary | ICD-10-CM | POA: Insufficient documentation

## 2014-05-02 HISTORY — DX: Essential (primary) hypertension: I10

## 2014-05-08 NOTE — Op Note (Signed)
PATIENT NAME:  Martin Adkins, Martin Adkins MR#:  [id number] DATE OF BIRTH:  [birth year]  DATE OF PROCEDURE:  02/16/2014  PREOPERATIVE DIAGNOSES: 1.  Extensive symptomatic left lower extremity deep vein thrombosis with pain and swelling in the left lower extremity.  2.  History of coronary disease.  3.  Status post previous stent graft abdominal aortic aneurysm repair.  4.  Hypertension.   POSTOPERATIVE DIAGNOSES: 1.  Extensive symptomatic left lower extremity deep vein thrombosis with pain and swelling in the left lower extremity.  2.  History of coronary disease.  3.  Status post previous stent graft abdominal aortic aneurysm repair.  4.  Hypertension.   PROCEDURES: 1.  Ultrasound guidance for vascular access, right femoral vein.  2.  Placement of a Cook Celect inferior vena cava filter from right femoral venous approach.  3.  Ultrasound guidance for vascular access to the left lesser saphenous vein.  4.  Catheter placement to inferior vena cava from left lesser saphenous vein.  5.  Left lower extremity venogram and inferior venacavagram.  6.  Catheter-directed thrombolysis with 8 mg of tPA delivered with a power-pulse spray fashion from the Kershaw catheter from the left popliteal vein, deep femoral vein, common femoral vein, and left iliac vein.  7.  Mechanical rheolytic thrombectomy with AngioJet Zelante catheter to left popliteal vein, deep femoral vein, common femoral vein, and left iliac vein.  8.  Percutaneous transluminal angioplasty of left popliteal vein with 8 mm diameter angioplasty balloon.  9.  Percutaneous transluminal angioplasty of left common femoral vein and iliac vein with 8, 10, and 12 mm diameter angioplasty balloon.   SURGEON:  Algernon Huxley, MD  ANESTHESIA:  Local with moderate conscious sedation.   BLOOD LOSS:  Minimal.   FLUOROSCOPY TIME:  Approximately 6 minutes.  CONTRAST USED:  62 mL.  INDICATION FOR PROCEDURE:  This is a 69 year old gentleman who was  seen in my office yesterday with extensive left lower extremity DVT. He came to the ER Sunday night due to severe symptoms and was found to have whole left leg DVT. Given the very symptomatic nature of the DVT and the extensive nature of the DVT, intervention was offered to the patient. Anticoagulation alone was also discussed with the patient. He desired intervention for the hopes of reducing postphlebitic symptoms, which the data would support. Risks and benefits including life-threatening hemorrhage were discussed, and he was agreeable to proceed.   DESCRIPTION OF PROCEDURE:  The patient is brought to the vascular suite. Initially, the right groin was sterilely prepped and draped, and a sterile surgical field was created. The right femoral vein was visualized with ultrasound and found to be widely patent. It was then accessed under direct ultrasound guidance without difficulty with a Seldinger needle. A J-wire was then placed. After skin nick and dilatation, the delivery sheath was placed into the inferior vena cava, and an inferior venacavogram was performed. This showed a patent IVC with the level of the renal veins about the top of L2. The filter was then deployed at the bottom of L2. I then removed the delivery sheath, held pressure on the groin, and placed a sterile pressure dressing. The patient was placed in the prone position, and I turned my attention to the left lesser saphenous vein. This was accessed just below the popliteal fossa. Under direct ultrasound guidance, the micropuncture needle and micropuncture wire and sheath were then placed. We upsized to an 8-French sheath. The patient had imaging performed through the sheath.  He had a thrombus all the way in the popliteal vein. There was near-occlusive stenosis in the popliteal vein, and it was unclear to me if this was a sequela of a previous DVT or part of the acute issue. There was thrombosis throughout the left leg, up to the common femoral  vein and tracking into the iliac veins. It was unclear if this was a May-Thurner syndrome or just extensive thrombosis propagating more proximally in the left lower extremity. I was able to cross all this without difficulty and get a Magic torque wire into the inferior vena cava with catheter support. I then placed the large 8-French AngioJet Zelante catheter and started with power-pulse spray, placing 8 mg of tPA. This started in the left popliteal vein, coursed throughout the venous system in the left lower extremity up through the deep femoral vein, the common femoral vein, and into the external and common iliac veins. This was allowed to dwell for approximately 10 minutes, and mechanical rheolytic thrombectomy was then started through the same veins, going from the popliteal vein all the way up through the common femoral vein and iliac veins. The stricture in the popliteal vein that was really separate and distinct from the more proximal thrombosis was treated with an 8 mm diameter angioplasty balloon with significant improvement in this location. The thrombus resolution was about 40 or 50% on our initial pass. There was still a large blob of thrombus in the common femoral vein at the femoral confluence, and I ran the Zelante catheter again. The common femoral vein up through the iliac veins were treated. Following this, there was still some narrowing in the common femoral vein, as well as in the external iliac vein. This did not appear to be a true May-Thurner syndrome, but due to thrombus and inflammation. There were strictures in the iliac veins causing narrowing. I ballooned these areas with an 8, 10, and then a 12 mm diameter angioplasty balloon from the femoral head in the common femoral vein up to the left common iliac vein through the external iliac vein. This resulted in marked improvement in flow. A third pass with the Zelante catheter was used to help clear thrombus at the femoral confluence, and the  resultant clearance of clot was probably in the 75 to 80% range. At this point, the flow was much more brisk, and I elected to terminate the procedure. We had ballooned the more proximal veins as well as the popliteal vein and performed thrombolysis and thrombectomy throughout from the popliteal vein up to the iliac veins with good result. The sheath was removed, and 4-0 Monocryl pursestring suture was placed. Pressure was held. Sterile dressings were placed. The patient tolerated the procedure well and was taken to the recovery room in stable condition.    ____________________________ Algernon Huxley, MD jsd:nb D: 02/16/2014 15:19:23 ET T: 02/17/2014 00:54:13 ET JOB#: [id number]  cc: Algernon Huxley, MD, <Dictator> Algernon Huxley MD ELECTRONICALLY SIGNED 02/17/2014 16:42

## 2014-06-24 ENCOUNTER — Ambulatory Visit: Payer: Self-pay

## 2014-07-12 ENCOUNTER — Ambulatory Visit: Payer: Self-pay | Admitting: Urology

## 2014-07-12 ENCOUNTER — Ambulatory Visit: Payer: Self-pay

## 2014-07-28 ENCOUNTER — Encounter: Payer: Self-pay | Admitting: Urology

## 2014-07-28 ENCOUNTER — Encounter: Payer: Self-pay | Admitting: *Deleted

## 2014-07-28 ENCOUNTER — Ambulatory Visit (INDEPENDENT_AMBULATORY_CARE_PROVIDER_SITE_OTHER): Payer: Medicare PPO | Admitting: Urology

## 2014-07-28 VITALS — BP 175/105 | HR 72 | Ht 72.0 in | Wt 213.8 lb

## 2014-07-28 DIAGNOSIS — N3941 Urge incontinence: Secondary | ICD-10-CM

## 2014-07-28 DIAGNOSIS — N486 Induration penis plastica: Secondary | ICD-10-CM

## 2014-07-28 DIAGNOSIS — I10 Essential (primary) hypertension: Secondary | ICD-10-CM

## 2014-07-28 DIAGNOSIS — N528 Other male erectile dysfunction: Secondary | ICD-10-CM | POA: Diagnosis not present

## 2014-07-28 DIAGNOSIS — K2270 Barrett's esophagus without dysplasia: Secondary | ICD-10-CM | POA: Insufficient documentation

## 2014-07-28 DIAGNOSIS — E119 Type 2 diabetes mellitus without complications: Secondary | ICD-10-CM | POA: Insufficient documentation

## 2014-07-28 DIAGNOSIS — D131 Benign neoplasm of stomach: Secondary | ICD-10-CM | POA: Insufficient documentation

## 2014-07-28 DIAGNOSIS — O2230 Deep phlebothrombosis in pregnancy, unspecified trimester: Secondary | ICD-10-CM | POA: Insufficient documentation

## 2014-07-28 HISTORY — DX: Deep phlebothrombosis in pregnancy, unspecified trimester: O22.30

## 2014-07-28 LAB — MICROSCOPIC EXAMINATION: BACTERIA UA: NONE SEEN

## 2014-07-28 LAB — BLADDER SCAN AMB NON-IMAGING

## 2014-07-28 LAB — URINALYSIS, COMPLETE
BILIRUBIN UA: NEGATIVE
Glucose, UA: NEGATIVE
Ketones, UA: NEGATIVE
LEUKOCYTES UA: NEGATIVE
Nitrite, UA: NEGATIVE
PH UA: 5.5 (ref 5.0–7.5)
UUROB: 0.2 mg/dL (ref 0.2–1.0)

## 2014-07-28 MED ORDER — OXYBUTYNIN CHLORIDE ER 10 MG PO TB24: 10.0000 mg | ORAL_TABLET | Freq: Every day | ORAL | Status: DC

## 2014-07-28 NOTE — Progress Notes (Signed)
07/28/2014 12:14 PM   Martin Adkins January 11, 1945 [id number]  Referring provider: No referring provider defined for this encounter.  Chief Complaint  Patient presents with  . Urinary Incontinence    New Patient    HPI: 69 yo M who presents today for evaluation of urge and urge incontinence.  He has noticed worsening symptoms including not being to get to the bathroom on time and occasional episodes of incontinence.  Incontinence occurs ~ couple times a week.  Dr. Gilford Rile did try some medications including Flomax and finasteride but this did not help therefore this medication was stopped.    He denies a weakstream or sensation of incomplete bladder emptying.  He does have nocturia 1-3x depending on what he drinks before bed.  Denies enuresis.    He drinks one cup per day of coffee and occasional EtOH but mostly water.  No sodas.    No history of UTI, flank pain or kidney stones.    He does have a remote history of Peyronies diease ~10 years ago, 10 degree upward curvature and penile shortening.  He does have some baseline ED with weak erections.   PDE5i are currently contraindicated.    Recently dx with DVT in left leg now on anticoagulation. He reports that over the past few days, he is felt fairly unwell.  PMH: Past Medical History  Diagnosis Date  . Hyperlipemia   . RA (rheumatoid arthritis)   . OSA (obstructive sleep apnea)   . PVD (peripheral vascular disease)   . GERD (gastroesophageal reflux disease)   . Osteoarthritis   . NSTEMI (non-ST elevated myocardial infarction)   . Hyperplastic polyps of stomach   . Barrett's esophagus   . Diabetes   . DVT (deep venous thrombosis)   . Abdominal aortic aneurysm without rupture   . CAD (coronary artery disease)   . SOB (shortness of breath)   . CHF (congestive heart failure)   . Hypertension     Surgical History: Past Surgical History  Procedure Laterality Date  . Coronary angioplasty with stent placement  2015     Home Medications:    Medication List       This list is accurate as of: 07/28/14 11:59 PM.  Always use your most recent med list.               amLODipine 5 MG tablet  Commonly known as:  NORVASC     aspirin EC 81 MG tablet  Take by mouth.     fluticasone 50 MCG/ACT nasal spray  Commonly known as:  FLONASE     hydroxychloroquine 200 MG tablet  Commonly known as:  PLAQUENIL     isosorbide mononitrate 30 MG 24 hr tablet  Commonly known as:  IMDUR     metoprolol succinate 100 MG 24 hr tablet  Commonly known as:  TOPROL-XL     oxybutynin 10 MG 24 hr tablet  Commonly known as:  DITROPAN-XL  Take 1 tablet (10 mg total) by mouth daily.     pantoprazole 40 MG tablet  Commonly known as:  PROTONIX     potassium chloride SA 20 MEQ tablet  Commonly known as:  K-DUR,KLOR-CON     warfarin 2.5 MG tablet  Commonly known as:  COUMADIN        Allergies:  Allergies  Allergen Reactions  . Ace Inhibitors Other (See Comments)    Other reaction(s): Cough  . Niacin Other (See Comments)  . Pravastatin Other (See Comments)  Other reaction(s): Muscle Pain  . Rosuvastatin Other (See Comments)    Other reaction(s): Other (See Comments) GI bleed  . Simvastatin Other (See Comments)    Other reaction(s): Muscle Pain  . Amoxicillin Rash  . Penicillins Rash    Family History: Family History  Problem Relation Age of Onset  . Bladder Cancer Neg Hx   . Prostate cancer Neg Hx   . Kidney cancer Neg Hx     Social History:  reports that he has been smoking.  He does not have any smokeless tobacco history on file. He reports that he drinks alcohol. He reports that he does not use illicit drugs.  ROS: UROLOGY Frequent Urination?: No Hard to postpone urination?: Yes Burning/pain with urination?: No Get up at night to urinate?: Yes Leakage of urine?: No Urine stream starts and stops?: No Trouble starting stream?: No Do you have to strain to urinate?: No Blood in urine?:  No Urinary tract infection?: No Sexually transmitted disease?: No Injury to kidneys or bladder?: No Painful intercourse?: No Weak stream?: No Erection problems?: Yes Penile pain?: No  Gastrointestinal Nausea?: No Vomiting?: No Indigestion/heartburn?: Yes Diarrhea?: No Constipation?: No  Constitutional Fever: No Night sweats?: No Weight loss?: No Fatigue?: No  Skin Skin rash/lesions?: No Itching?: No  Eyes Blurred vision?: No Double vision?: No  Ears/Nose/Throat Sore throat?: No Sinus problems?: Yes  Hematologic/Lymphatic Swollen glands?: No Easy bruising?: Yes  Cardiovascular Leg swelling?: No Chest pain?: No  Respiratory Cough?: No Shortness of breath?: No  Endocrine Excessive thirst?: No  Musculoskeletal Back pain?: Yes Joint pain?: Yes  Neurological Headaches?: No Dizziness?: No  Psychologic Depression?: No Anxiety?: No  Physical Exam: BP 175/105 mmHg  Pulse 72  Ht 6' (1.829 m)  Wt 213 lb 12.8 oz (96.979 kg)  BMI 28.99 kg/m2  Constitutional:  Alert and oriented, No acute distress. HEENT: [place] AT, moist mucus membranes.  Trachea midline, no masses. Cardiovascular: No clubbing, cyanosis, or edema. Respiratory: Normal respiratory effort, no increased work of breathing. GI: Abdomen is soft, nontender, nondistended, no abdominal masses GU: Scrotum unremarkable, bilateral testicles descended without masses. Circumcised phallus with penile plaque noted on dorsal base of the penile shaft. Rectal exam today with normal external sphincter tone, enlarged prostate, only able to palpate apex due to habitus, nodules. Skin: No rashes, bruises or suspicious lesions. Lymph: No cervical or inguinal adenopathy. Neurologic: Grossly intact, no focal deficits, moving all 4 extremities. Psychiatric: Normal mood and affect.  Laboratory Data: Lab Results  Component Value Date   WBC 10.0 07/30/2014   HGB 15.4 07/30/2014   HCT 47.4 07/30/2014   MCV 79.6*  07/30/2014   PLT 189 07/30/2014    Lab Results  Component Value Date   CREATININE 1.06 07/30/2014  Cr 0.9 on 06/02/14  PSA 0.42 11/2013  Urinalysis Results for orders placed or performed in visit on 07/28/14  Microscopic Examination  Result Value Ref Range   WBC, UA 0-5 0 -  5 /hpf   RBC, UA 0-2 0 -  2 /hpf   Epithelial Cells (non renal) 0-10 0 - 10 /hpf   Mucus, UA Present (A) Not Estab.   Bacteria, UA None seen None seen/Few  Urinalysis, Complete  Result Value Ref Range   Specific Gravity, UA >1.030 (H) 1.005 - 1.030   pH, UA 5.5 5.0 - 7.5   Color, UA Yellow Yellow   Appearance Ur Clear Clear   Leukocytes, UA Negative Negative   Protein, UA 1+ (A) Negative/Trace  Glucose, UA Negative Negative   Ketones, UA Negative Negative   RBC, UA Trace (A) Negative   Bilirubin, UA Negative Negative   Urobilinogen, Ur 0.2 0.2 - 1.0 mg/dL   Nitrite, UA Negative Negative   Microscopic Examination See below:   BLADDER SCAN AMB NON-IMAGING  Result Value Ref Range   Scan Result 76ml    visit  Assessment & Plan:  69 year old male who presents with primary complaints of urgency/frequency, and urge incontinence. No benefit from finasteride and Flomax in the past.  No evidence of overflow incontinence.  1. Urge incontinence of urine Today, we discussed how of anticholinergic medication for his overactive symptoms. He is concerned about the cost therefore will prescribe generic oxybutynin. We discussed common side effects of anticholinergic medications including dry eyes, dry mouth, and constipation. He will call and let us know if he is experiencing any significant side effects or whether or not he is having any benefit, we will adjust prescription based on his feedback. Plan to have follow-up in 6 weeks to reassess symptoms, postvoid residual. - Urinalysis, Complete - BLADDER SCAN AMB NON-IMAGING   2. Other male erectile dysfunction History of refractory erectile dysfunction, not  currently a candidate for PDE 5 inhibitors. May discuss intracavernosal injections in the future.  3. Peyronie disease Stable curvature, minimal bother.  4. Benign essential HTN BP elevated today, has not been feeling well over the past few days.  He reports being compliant with medicatoin.  Called PCP to make aware.  He has been worked in to be seen this afternoon in their office.   Return in about 6 weeks (around 09/08/2014), or IPSS, PVR.  Hollice Espy, MD  [facility] [address] [place], [place] [address] [phone]

## 2014-07-30 ENCOUNTER — Emergency Department: Payer: Medicare PPO

## 2014-07-30 ENCOUNTER — Emergency Department
Admission: EM | Admit: 2014-07-30 | Discharge: 2014-07-30 | Payer: Medicare PPO | Attending: Emergency Medicine | Admitting: Emergency Medicine

## 2014-07-30 ENCOUNTER — Other Ambulatory Visit: Payer: Self-pay

## 2014-07-30 ENCOUNTER — Encounter: Payer: Self-pay | Admitting: Urology

## 2014-07-30 ENCOUNTER — Encounter: Payer: Self-pay | Admitting: Emergency Medicine

## 2014-07-30 DIAGNOSIS — I10 Essential (primary) hypertension: Secondary | ICD-10-CM | POA: Insufficient documentation

## 2014-07-30 DIAGNOSIS — Z720 Tobacco use: Secondary | ICD-10-CM | POA: Insufficient documentation

## 2014-07-30 DIAGNOSIS — I2119 ST elevation (STEMI) myocardial infarction involving other coronary artery of inferior wall: Secondary | ICD-10-CM

## 2014-07-30 DIAGNOSIS — I2111 ST elevation (STEMI) myocardial infarction involving right coronary artery: Secondary | ICD-10-CM

## 2014-07-30 DIAGNOSIS — Z880 Allergy status to penicillin: Secondary | ICD-10-CM | POA: Insufficient documentation

## 2014-07-30 DIAGNOSIS — R079 Chest pain, unspecified: Secondary | ICD-10-CM | POA: Diagnosis present

## 2014-07-30 DIAGNOSIS — Z7982 Long term (current) use of aspirin: Secondary | ICD-10-CM | POA: Diagnosis not present

## 2014-07-30 DIAGNOSIS — I214 Non-ST elevation (NSTEMI) myocardial infarction: Secondary | ICD-10-CM | POA: Insufficient documentation

## 2014-07-30 DIAGNOSIS — E119 Type 2 diabetes mellitus without complications: Secondary | ICD-10-CM | POA: Insufficient documentation

## 2014-07-30 DIAGNOSIS — Z79899 Other long term (current) drug therapy: Secondary | ICD-10-CM | POA: Insufficient documentation

## 2014-07-30 DIAGNOSIS — Z7901 Long term (current) use of anticoagulants: Secondary | ICD-10-CM | POA: Insufficient documentation

## 2014-07-30 DIAGNOSIS — I219 Acute myocardial infarction, unspecified: Secondary | ICD-10-CM

## 2014-07-30 DIAGNOSIS — I2510 Atherosclerotic heart disease of native coronary artery without angina pectoris: Secondary | ICD-10-CM | POA: Diagnosis not present

## 2014-07-30 HISTORY — PX: LEFT HEART CATH AND CORS/GRAFTS ANGIOGRAPHY: CATH118250

## 2014-07-30 HISTORY — DX: Acute myocardial infarction, unspecified: I21.9

## 2014-07-30 HISTORY — DX: ST elevation (STEMI) myocardial infarction involving other coronary artery of inferior wall: I21.19

## 2014-07-30 LAB — COMPREHENSIVE METABOLIC PANEL
ALBUMIN: 4.3 g/dL (ref 3.5–5.0)
ALT: 26 U/L (ref 17–63)
AST: 33 U/L (ref 15–41)
Alkaline Phosphatase: 85 U/L (ref 38–126)
Anion gap: 11 (ref 5–15)
BILIRUBIN TOTAL: 1.1 mg/dL (ref 0.3–1.2)
BUN: 16 mg/dL (ref 6–20)
CHLORIDE: 100 mmol/L — AB (ref 101–111)
CO2: 26 mmol/L (ref 22–32)
Calcium: 9 mg/dL (ref 8.9–10.3)
Creatinine, Ser: 1.06 mg/dL (ref 0.61–1.24)
GFR calc Af Amer: >60 mL/min (ref >60)
Glucose, Bld: 125 mg/dL — ABNORMAL HIGH (ref 65–99)
POTASSIUM: 3.5 mmol/L (ref 3.5–5.1)
SODIUM: 137 mmol/L (ref 135–145)
Total Protein: 8.1 g/dL (ref 6.5–8.1)

## 2014-07-30 LAB — CBC
HEMATOCRIT: 47.4 % (ref 40.0–52.0)
Hemoglobin: 15.4 g/dL (ref 13.0–18.0)
MCH: 25.8 pg — ABNORMAL LOW (ref 26.0–34.0)
MCHC: 32.4 g/dL (ref 32.0–36.0)
MCV: 79.6 fL — ABNORMAL LOW (ref 80.0–100.0)
PLATELETS: 189 10*3/uL (ref 150–440)
RBC: 5.96 MIL/uL — AB (ref 4.40–5.90)
RDW: 16.2 % — ABNORMAL HIGH (ref 11.5–14.5)
WBC: 10 10*3/uL (ref 3.8–10.6)

## 2014-07-30 LAB — TROPONIN I: Troponin I: <0.03 ng/mL (ref <0.031)

## 2014-07-30 LAB — PROTIME-INR
INR: 0.93
Prothrombin Time: 12.7 seconds (ref 11.4–15.0)

## 2014-07-30 LAB — APTT: APTT: 29 s (ref 24–36)

## 2014-07-30 LAB — BRAIN NATRIURETIC PEPTIDE: B Natriuretic Peptide: 234 pg/mL — ABNORMAL HIGH (ref 0.0–100.0)

## 2014-07-30 MED ORDER — CLOPIDOGREL BISULFATE 75 MG PO TABS
600.0000 mg | ORAL_TABLET | Freq: Once | ORAL | Status: AC
  Administered 2014-07-30: 600 mg via ORAL

## 2014-07-30 MED ORDER — ASPIRIN 81 MG PO CHEW
324.0000 mg | CHEWABLE_TABLET | Freq: Once | ORAL | Status: AC
  Administered 2014-07-30: 324 mg via ORAL

## 2014-07-30 MED ORDER — SODIUM CHLORIDE 0.9 % IV BOLUS (SEPSIS)
1000.0000 mL | Freq: Once | INTRAVENOUS | Status: AC
  Administered 2014-07-30: 1000 mL via INTRAVENOUS

## 2014-07-30 MED ORDER — HEPARIN SODIUM (PORCINE) 5000 UNIT/ML IJ SOLN
4000.0000 [IU] | Freq: Once | INTRAMUSCULAR | Status: AC
  Administered 2014-07-30: 4000 [IU] via INTRAVENOUS

## 2014-07-30 MED ORDER — NITROGLYCERIN 0.4 MG SL SUBL
0.4000 mg | SUBLINGUAL_TABLET | SUBLINGUAL | Status: DC | PRN
  Administered 2014-07-30: 0.4 mg via SUBLINGUAL

## 2014-07-30 NOTE — ED Notes (Signed)
324 ASA given by LF

## 2014-07-30 NOTE — ED Notes (Signed)
Pt presents to ER alert and in NAD. Pt states CP that started this morning in his chest. Pt states SOB, but is speaking incomplete sentences. Resp even and unlabored.

## 2014-07-30 NOTE — ED Provider Notes (Signed)
[facility] Emergency Department Provider Note  Time seen: 5:27 AM  I have reviewed the triage vital signs and the nursing notes.   HISTORY  Chief Complaint Chest Pain    HPI Martin Adkins is a 69 y.o. male with a past medical history of hyperlipidemia, hypertension, diabetes, abdominal AAA, CHF, MI 3, CABG, cardiac stent one year ago at [facility], presents the emergency department with 8/10 burning chest pain. Patient states the pain woke him up from his sleep. It feels identical to his last heart attack last year. States some nausea, denies shortness of breath or diaphoresis. Denies leg pain or swelling. Patient does have an IVC filter, currently on warfarin. Did not take aspirin yet today. Denies any modifying factors for the chest pain.     Past Medical History  Diagnosis Date  . Hyperlipemia   . RA (rheumatoid arthritis)   . OSA (obstructive sleep apnea)   . PVD (peripheral vascular disease)   . GERD (gastroesophageal reflux disease)   . Osteoarthritis   . NSTEMI (non-ST elevated myocardial infarction)   . Hyperplastic polyps of stomach   . Barrett's esophagus   . Diabetes   . DVT (deep venous thrombosis)   . Abdominal aortic aneurysm without rupture   . CAD (coronary artery disease)   . SOB (shortness of breath)   . CHF (congestive heart failure)   . Hypertension     Patient Active Problem List   Diagnosis Date Noted  . Barrett esophagus 07/28/2014  . Diabetes mellitus, type 2 07/28/2014  . 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/28/2014  . Hyperplastic adenomatous polyp of stomach 07/28/2014  . Benign essential HTN 05/02/2014  . Chronic systolic heart failure 08/20/4816  . AAA (abdominal aortic aneurysm) without rupture 04/05/2014  . Arteriosclerosis of coronary artery 04/05/2014  . Breathlessness on exertion 04/05/2014  . Acute non-ST segment elevation myocardial infarction 05/14/2013  . Acid reflux 05/04/2013  . Combined fat and  carbohydrate induced hyperlipemia 05/03/2013  . Obstructive apnea 05/03/2013  . Arthritis, degenerative 05/03/2013  . Peripheral blood vessel disorder 05/03/2013  . Arthritis or polyarthritis, rheumatoid 05/03/2013  . Compulsive tobacco user syndrome 05/03/2013    Past Surgical History  Procedure Laterality Date  . Coronary angioplasty with stent placement  2015    Current Outpatient Rx  Name  Route  Sig  Dispense  Refill  . amLODipine (NORVASC) 5 MG tablet               . aspirin EC 81 MG tablet   Oral   Take by mouth.         . fluticasone (FLONASE) 50 MCG/ACT nasal spray               . hydroxychloroquine (PLAQUENIL) 200 MG tablet               . isosorbide mononitrate (IMDUR) 30 MG 24 hr tablet               . metoprolol succinate (TOPROL-XL) 100 MG 24 hr tablet               . oxybutynin (DITROPAN-XL) 10 MG 24 hr tablet   Oral   Take 1 tablet (10 mg total) by mouth daily.   30 tablet   11   . pantoprazole (PROTONIX) 40 MG tablet               . potassium chloride SA (K-DUR,KLOR-CON) 20 MEQ tablet               .  warfarin (COUMADIN) 2.5 MG tablet                 Allergies Ace inhibitors; Niacin; Pravastatin; Rosuvastatin; Simvastatin; Amoxicillin; and Penicillins  Family History  Problem Relation Age of Onset  . Bladder Cancer Neg Hx   . Prostate cancer Neg Hx   . Kidney cancer Neg Hx     Social History History  Substance Use Topics  . Smoking status: Current Some Day Smoker  . Smokeless tobacco: Not on file  . Alcohol Use: 0.0 oz/week    0 Standard drinks or equivalent per week    Review of Systems Constitutional: Negative for fever. Cardiovascular: Positive for chest pain. Respiratory: Negative for shortness of breath. Gastrointestinal: Negative for abdominal pain. Positive for nausea. Neurological: Negative for headache 10-point ROS otherwise  negative.  ____________________________________________   PHYSICAL EXAM:  VITAL SIGNS: ED Triage Vitals  Enc Vitals Group     BP 07/30/14 0508 173/94 mmHg     Pulse Rate 07/30/14 0508 69     Resp 07/30/14 0508 20     Temp 07/30/14 0508 98.4 F (36.9 C)     Temp Source 07/30/14 0508 Oral     SpO2 07/30/14 0508 99 %     Weight 07/30/14 0508 210 lb (95.255 kg)     Height 07/30/14 0508 6' (1.829 m)     Head Cir --      Peak Flow --      Pain Score 07/30/14 0509 8     Pain Loc --      Pain Edu? --      Excl. in [place]? --     Constitutional: Alert and oriented. Mild distress due to chest pain. ENT   Mouth/Throat: Mucous membranes are moist. Cardiovascular: Normal rate, regular rhythm. No murmur Respiratory: Normal respiratory effort without tachypnea nor retractions. Breath sounds are clear Gastrointestinal: Soft and nontender. No distention.   Musculoskeletal: Nontender with normal range of motion in all extremities. No lower extremity tenderness or edema. Neurologic:  Normal speech and language. No gross focal neurologic deficits Skin:  Skin is warm, dry and intact.  Psychiatric: Mood and affect are normal. Speech and behavior are normal.  ____________________________________________    EKG  EKG reviewed and interpreted by myself shows sinus rhythm at 76 bpm, narrow QRS, normal axis, normal intervals. The patient does have ST elevations in lead 2, 3, aVF. Patient appears to have reciprocal depressions in V2, V3. Consistent with possible inferior STEMI.  ____________________________________________    RADIOLOGY  Chest x-ray pending. No acute abnormalities on my interpretation of the x-ray.  ____________________________________________ CRITICAL CARE Performed by: Harvest Dark   Total critical care time: 30 minutes  Critical care time was exclusive of separately billable procedures and treating other patients.  Critical care was necessary to treat or  prevent imminent or life-threatening deterioration.  Critical care was time spent personally by me on the following activities: development of treatment plan with patient and/or surrogate as well as nursing, discussions with consultants, evaluation of patient's response to treatment, examination of patient, obtaining history from patient or surrogate, ordering and performing treatments and interventions, ordering and review of laboratory studies, ordering and review of radiographic studies, pulse oximetry and re-evaluation of patient's condition.   INITIAL IMPRESSION / ASSESSMENT AND PLAN / ED COURSE  Pertinent labs & imaging results that were available during my care of the patient were reviewed by me and considered in my medical decision making (see chart for details).  Patient with acute onset of 8/10 burning sensation to the center of his chest that woke him from his sleep. Patient also states considerable nausea. States this feels exactly like his last heart attack one year ago. Patient vitals are stable at this time. We have dosed for aspirin, one sublingual nitroglycerin. Patient states the pain is decreased from 8/10 to a 5/10. After sublingual nitroglycerin, the patient became somewhat hypotensive of 95 systolic, and became diaphoretic. Bolusing with 1 L normal saline. Placed in Trendelenburg, and the patient appears much better and says he is feeling better. I discussed the patient immediately with the [place] [facility] fellow, who is accepting the patient directly to the Cath Lab, they have activated their STEMI pager. The [facility] fellow would like the patient bolused with heparin as well as Plavix, he is aware that the patient is currently on warfarin. We will transport ASAP to [facility] for emergent consideration of cardiac intervention.  EMS here to transport the patient. Patient appears well currently. States the pain has somewhat improved.  Labs currently  pending  ____________________________________________   FINAL CLINICAL IMPRESSION(S) / ED DIAGNOSES  Inferior STEMI   Harvest Dark, MD 07/30/14 [phone]

## 2014-07-30 NOTE — ED Notes (Signed)
Pt's daughter informed by April, RN

## 2014-07-30 NOTE — ED Notes (Signed)
Attempted to call report to [facility], asked to call back in 10 minutes because receiving nurse unavailable.

## 2014-07-30 NOTE — ED Notes (Signed)
STEMI.  Pt reports hx MI and stent last year.  Pt reports some nausea.  Rates 8/10, described as burning.

## 2014-07-30 NOTE — ED Notes (Signed)
1 nitro sublingual given by LF

## 2014-07-30 NOTE — ED Notes (Signed)
Lab called regarding blood glucose of 20.  Lab states they will recheck.  Staff at [facility] informed by this nurse.

## 2014-07-30 NOTE — ED Notes (Signed)
Heparin given by AB 4000 units

## 2014-07-30 NOTE — ED Notes (Signed)
Lab called stating last glucose was false reading.  Actual level 125.  [facility] staff informed.

## 2014-08-01 DIAGNOSIS — D6861 Antiphospholipid syndrome: Secondary | ICD-10-CM | POA: Insufficient documentation

## 2014-08-01 DIAGNOSIS — Z86718 Personal history of other venous thrombosis and embolism: Secondary | ICD-10-CM | POA: Insufficient documentation

## 2014-08-05 DIAGNOSIS — I4891 Unspecified atrial fibrillation: Secondary | ICD-10-CM | POA: Insufficient documentation

## 2014-08-15 DIAGNOSIS — I214 Non-ST elevation (NSTEMI) myocardial infarction: Secondary | ICD-10-CM | POA: Insufficient documentation

## 2014-08-15 HISTORY — DX: Non-ST elevation (NSTEMI) myocardial infarction: I21.4

## 2014-09-06 ENCOUNTER — Ambulatory Visit: Payer: Medicare PPO | Admitting: Urology

## 2014-09-06 ENCOUNTER — Ambulatory Visit: Payer: Medicare PPO

## 2014-09-06 ENCOUNTER — Encounter: Payer: Self-pay | Admitting: *Deleted

## 2014-09-06 ENCOUNTER — Ambulatory Visit
Admission: RE | Admit: 2014-09-06 | Discharge: 2014-09-06 | Disposition: A | Discharge status: Home / Self Care | Payer: Medicare PPO | Source: Ambulatory Visit | Attending: Vascular Surgery | Admitting: Vascular Surgery

## 2014-09-06 ENCOUNTER — Encounter
Admission: RE | Disposition: A | Discharge status: Home / Self Care | Payer: Self-pay | Source: Ambulatory Visit | Attending: Vascular Surgery

## 2014-09-06 DIAGNOSIS — I252 Old myocardial infarction: Secondary | ICD-10-CM | POA: Diagnosis not present

## 2014-09-06 DIAGNOSIS — I2510 Atherosclerotic heart disease of native coronary artery without angina pectoris: Secondary | ICD-10-CM | POA: Insufficient documentation

## 2014-09-06 DIAGNOSIS — F17200 Nicotine dependence, unspecified, uncomplicated: Secondary | ICD-10-CM | POA: Insufficient documentation

## 2014-09-06 DIAGNOSIS — Z86711 Personal history of pulmonary embolism: Secondary | ICD-10-CM | POA: Diagnosis present

## 2014-09-06 DIAGNOSIS — Z86718 Personal history of other venous thrombosis and embolism: Secondary | ICD-10-CM | POA: Diagnosis present

## 2014-09-06 DIAGNOSIS — Z7901 Long term (current) use of anticoagulants: Secondary | ICD-10-CM | POA: Insufficient documentation

## 2014-09-06 DIAGNOSIS — N400 Enlarged prostate without lower urinary tract symptoms: Secondary | ICD-10-CM | POA: Diagnosis not present

## 2014-09-06 DIAGNOSIS — Z79899 Other long term (current) drug therapy: Secondary | ICD-10-CM | POA: Insufficient documentation

## 2014-09-06 DIAGNOSIS — I10 Essential (primary) hypertension: Secondary | ICD-10-CM | POA: Diagnosis not present

## 2014-09-06 DIAGNOSIS — I714 Abdominal aortic aneurysm, without rupture: Secondary | ICD-10-CM | POA: Diagnosis present

## 2014-09-06 DIAGNOSIS — K219 Gastro-esophageal reflux disease without esophagitis: Secondary | ICD-10-CM | POA: Diagnosis not present

## 2014-09-06 DIAGNOSIS — I872 Venous insufficiency (chronic) (peripheral): Secondary | ICD-10-CM | POA: Insufficient documentation

## 2014-09-06 DIAGNOSIS — I8310 Varicose veins of unspecified lower extremity with inflammation: Secondary | ICD-10-CM | POA: Insufficient documentation

## 2014-09-06 HISTORY — PX: PERIPHERAL VASCULAR CATHETERIZATION: SHX172C

## 2014-09-06 HISTORY — DX: Benign prostatic hyperplasia without lower urinary tract symptoms: N40.0

## 2014-09-06 HISTORY — DX: Unspecified osteoarthritis, unspecified site: M19.90

## 2014-09-06 LAB — PROTIME-INR
INR: 1.32
Prothrombin Time: 16.6 seconds — ABNORMAL HIGH (ref 11.4–15.0)

## 2014-09-06 SURGERY — IVC FILTER REMOVAL
Anesthesia: Moderate Sedation

## 2014-09-06 MED ORDER — MIDAZOLAM HCL 2 MG/2ML IJ SOLN
INTRAMUSCULAR | Status: DC | PRN
  Administered 2014-09-06 (×2): 1 mg via INTRAVENOUS
  Administered 2014-09-06: 2 mg via INTRAVENOUS

## 2014-09-06 MED ORDER — LIDOCAINE-EPINEPHRINE (PF) 1 %-1:200000 IJ SOLN
INTRAMUSCULAR | Status: DC | PRN
  Administered 2014-09-06: 10 mL via INTRADERMAL

## 2014-09-06 MED ORDER — CEFAZOLIN SODIUM 1-5 GM-% IV SOLN
INTRAVENOUS | Status: AC
  Filled 2014-09-06: qty 50

## 2014-09-06 MED ORDER — SODIUM CHLORIDE 0.9 % IV SOLN
INTRAVENOUS | Status: DC
  Administered 2014-09-06 (×2): via INTRAVENOUS

## 2014-09-06 MED ORDER — FENTANYL CITRATE (PF) 100 MCG/2ML IJ SOLN
INTRAMUSCULAR | Status: AC
  Filled 2014-09-06: qty 2

## 2014-09-06 MED ORDER — CLINDAMYCIN PHOSPHATE 300 MG/50ML IV SOLN: 300.0000 mg | Freq: Once | INTRAVENOUS | Status: DC

## 2014-09-06 MED ORDER — MIDAZOLAM HCL 5 MG/5ML IJ SOLN
INTRAMUSCULAR | Status: AC
  Filled 2014-09-06: qty 5

## 2014-09-06 MED ORDER — FENTANYL CITRATE (PF) 100 MCG/2ML IJ SOLN
INTRAMUSCULAR | Status: DC | PRN
  Administered 2014-09-06 (×2): 50 ug via INTRAVENOUS

## 2014-09-06 MED ORDER — CLINDAMYCIN PHOSPHATE 300 MG/50ML IV SOLN
INTRAVENOUS | Status: AC
  Administered 2014-09-06: 11:00:00
  Filled 2014-09-06: qty 50

## 2014-09-06 SURGICAL SUPPLY — 4 items
PACK ANGIOGRAPHY (CUSTOM PROCEDURE TRAY) ×2 IMPLANT
SET VENACAVA FILTER RETRIEVAL (MISCELLANEOUS) ×2 IMPLANT
TOWEL OR 17X26 4PK STRL BLUE (TOWEL DISPOSABLE) ×2 IMPLANT
WIRE J 3MM .035X145CM (WIRE) ×2 IMPLANT

## 2014-09-06 NOTE — Discharge Instructions (Signed)
Inferior Vena Cava Filter Insertion, Care After °Refer to this sheet in the next few weeks. These instructions provide you with information on caring for yourself after your procedure. Your health care provider may also give you more specific instructions. Your treatment has been planned according to current medical practices, but problems sometimes occur. Call your health care provider if you have any problems or questions after your procedure. °WHAT TO EXPECT AFTER THE PROCEDURE °After your procedure, it is typical to have the following: °· Mild pain in the area where the filter was inserted. °· Mild bruising in the area where the filter was inserted. °HOME CARE INSTRUCTIONS °· You will be given medicine to control pain. Only take over-the-counter or prescription medicines for pain, fever, or discomfort as directed by your health care provider. °· A bandage (dressing) has been placed over the insertion site. Follow your health care provider's instructions on how to care for it. °· Keep the insertion site clean and dry. °· Do not soak in a bath tub or pool until the filter insertion site has healed. °· Do not drive if you are taking narcotic pain medicines. Follow your health care provider's instructions about driving.  °· Do not return to work or school until your health care provider says it is okay.   °· Keep all follow-up appointments.   °SEEK IMMEDIATE MEDICAL CARE IF: °· You develop swelling and discoloration or pain in the legs. °· Your legs become pale and cold or blue. °· You develop shortness of breath, feel faint, or pass out. °· You develop chest pain, a cough, or difficulty breathing. °· You cough up blood. °· You develop a rash or feel you are having problems that may be a side effect of medicines. °· You develop weakness, difficulty moving your arms or legs, or balance problems. °· You develop problems with speech or vision. °Document Released: 10/14/2012 Document Reviewed: 10/14/2012 °[employer]®  Patient Information ©2015 [employer]. This information is not intended to replace advice given to you by your health care provider. Make sure you discuss any questions you have with your health care provider. ° °

## 2014-09-06 NOTE — Op Note (Signed)
  OPERATIVE NOTE   PRE-OPERATIVE DIAGNOSIS: History of DVT with pulmonary embolism; abdominal aortic aneurysm; hypercoagulable state  POST-OPERATIVE DIAGNOSIS: Same  PROCEDURE: 1. Retrieval of IVC Filter 2. Inferior Vena Cavagram  SURGEON: Katha Cabal, M.D.  ANESTHESIA:  Conscious Sedation  ESTIMATED BLOOD LOSS: Minimal cc  FINDING(S):inferior vena cava is widely patent filter is in place in good position. Filter is removed without incident  SPECIMEN(S):  IVC filter  INDICATIONS:   Martin Adkins is a 69 y.o. male who presents with history of DVT and PE. He has been maintained on Coumadin and done well. He is therefore requesting his filter be removed. Risks and benefits were reviewed all questions were answered patient has agreed to proceed with IVC filter retrieval.  DESCRIPTION: After obtaining full informed written consent, the patient was brought back to the Special Procedure Suite and placed in the supine position.  The patient received IV antibiotics prior to induction.  After obtaining adequate sedation, the patient was prepped and draped in the standard fashion and appropriate time out is called.     Ultrasound was placed in a sterile sleeve.The right neck was then imaged with ultrasound.   Jugular vein was identified it is echolucent and homogeneous indicating patency. 1% lidocaine is then infiltrated under ultrasound visualization and subsequently a Seldinger needle is inserted under real-time ultrasound guidance.  J-wire is then advanced into the inferior vena cava under fluoroscopic guidance. With the tip of the sheath at the confluence of the iliac veins inferior vena caval imaging is performed.  After review of the image the sheath is repositioned to above the filter and the snares introduced. Snares opened and the hook is secured without difficulty. The filter is then collapsed within the sheath and removed without difficulty.  Sheath is removed by pressures  held the patient tolerated the procedure well and there were no immediate complications.  Interpretation: inferior vena cava is widely patent filter is in place in good position. Filter is removed without incident.     COMPLICATIONS: None  CONDITION: [name], M.D. [facility] Office: [phone]   09/06/2014, 11:44 AM

## 2014-09-06 NOTE — H&P (Signed)
[facility] History & Physical Update  The patient was interviewed and re-examined.  The patient's previous History and Physical has been reviewed and is unchanged.  There is no change in the plan of care. We plan to proceed with the scheduled procedure.  Martin Adkins, Martin Lory, MD  09/06/2014, 11:41 AM

## 2014-09-07 ENCOUNTER — Encounter: Payer: Self-pay | Admitting: Vascular Surgery

## 2014-09-26 ENCOUNTER — Encounter: Payer: Medicare PPO | Attending: Internal Medicine | Admitting: *Deleted

## 2014-09-26 VITALS — Ht 72.0 in | Wt 211.6 lb

## 2014-09-26 DIAGNOSIS — I2119 ST elevation (STEMI) myocardial infarction involving other coronary artery of inferior wall: Secondary | ICD-10-CM

## 2014-09-26 NOTE — Progress Notes (Signed)
Cardiac Individual Treatment Plan  Patient Details  Name: NAYSHAWN MESTA MRN: [id number] Date of Birth: November 05, 1945 Referring Provider:  Corey Skains, MD  Initial Encounter Date: Date: 09/26/14  Visit Diagnosis: ST elevation myocardial infarction (STEMI) involving other coronary artery of inferior wall  Patient's Home Medications on Admission:  Current outpatient prescriptions:  .  amLODipine (NORVASC) 5 MG tablet, 10 mg daily. , Disp: , Rfl:  .  clopidogrel (PLAVIX) 75 MG tablet, Take by mouth., Disp: , Rfl:  .  fluticasone (FLONASE) 50 MCG/ACT nasal spray, , Disp: , Rfl:  .  hydroxychloroquine (PLAQUENIL) 200 MG tablet, , Disp: , Rfl:  .  isosorbide mononitrate (IMDUR) 30 MG 24 hr tablet, , Disp: , Rfl:  .  metoprolol succinate (TOPROL-XL) 100 MG 24 hr tablet, , Disp: , Rfl:  .  oxybutynin (DITROPAN-XL) 10 MG 24 hr tablet, Take 1 tablet (10 mg total) by mouth daily., Disp: 30 tablet, Rfl: 11 .  pantoprazole (PROTONIX) 40 MG tablet, , Disp: , Rfl:  .  Polyvinyl Alcohol (LUBRICANT DROPS OP), Place 1 drop into both eyes daily., Disp: , Rfl:  .  potassium chloride SA (K-DUR,KLOR-CON) 20 MEQ tablet, , Disp: , Rfl:  .  valsartan (DIOVAN) 160 MG tablet, Take 160 mg by mouth daily., Disp: , Rfl:  .  warfarin (COUMADIN) 2.5 MG tablet, 2.5 mg. 5 mg one day alternate with 7 mg next day, Disp: , Rfl:  .  aspirin 81 MG tablet, Take 1 tablet by mouth daily., Disp: , Rfl:  .  aspirin EC 81 MG tablet, Take by mouth., Disp: , Rfl:  .  atorvastatin (LIPITOR) 80 MG tablet, TAKE 1 TABLET (80 MG TOTAL) BY MOUTH ONCE DAILY., Disp: , Rfl: 11 .  benazepril (LOTENSIN) 20 MG tablet, Take 1 tablet by mouth daily., Disp: , Rfl:  .  chlorthalidone (HYGROTON) 25 MG tablet, Take 1 tablet by mouth daily., Disp: , Rfl:  .  Naphazoline-Polyethyl Glycol 0.012-0.2 % SOLN, Place 1 drop into both eyes daily., Disp: , Rfl:   Past Medical History: Past Medical History  Diagnosis Date  . Hyperlipemia   . RA  (rheumatoid arthritis)   . OSA (obstructive sleep apnea)   . PVD (peripheral vascular disease)   . GERD (gastroesophageal reflux disease)   . Osteoarthritis   . NSTEMI (non-ST elevated myocardial infarction)   . Hyperplastic polyps of stomach   . Barrett's esophagus   . Diabetes   . DVT (deep venous thrombosis)   . Abdominal aortic aneurysm without rupture   . CAD (coronary artery disease)   . SOB (shortness of breath)   . CHF (congestive heart failure)   . Hypertension   . Arthritis   . BPH (benign prostatic hyperplasia)     Tobacco Use: History  Smoking status  . Current Some Day Smoker -- 3.00 packs/day for 20 years  Smokeless tobacco  . Not on file    Labs: Recent Review Flowsheet Data    Labs for ITP Cardiac and Pulmonary Rehab Latest Ref Rng 05/03/2013   Cholestrol 0-200 mg/dL 196   LDLCALC 0-100 mg/dL 121(H)   HDL 40-60 mg/dL 42   Trlycerides 0-200 mg/dL 164       Exercise Target Goals: Date: 09/26/14  Exercise Program Goal: Individual exercise prescription set with THRR, safety & activity barriers. Participant demonstrates ability to understand and report RPE using BORG scale, to self-measure pulse accurately, and to acknowledge the importance of the exercise prescription.  Exercise Prescription Goal:  Starting with aerobic activity 30 plus minutes a day, 3 days per week for initial exercise prescription. Provide home exercise prescription and guidelines that participant acknowledges understanding prior to discharge.  Activity Barriers & Risk Stratification:     Activity Barriers & Risk Stratification - 09/26/14 1158    Activity Barriers & Risk Stratification   Activity Barriers Arthritis   Risk Stratification High      6 Minute Walk:     6 Minute Walk      09/26/14 1259       6 Minute Walk   Phase Initial     Distance 1330 feet     Walk Time 6 minutes     Resting HR 74 bpm     Resting BP 112/78 mmHg     Max Ex. HR 89 bpm     Max Ex. BP  130/70 mmHg     RPE 11     Symptoms No        Initial Exercise Prescription:     Initial Exercise Prescription - 09/26/14 1300    Date of Initial Exercise Prescription   Date 09/26/14   Treadmill   MPH 2.5   Grade 0   Minutes 15   Bike   Level 1   Minutes 15   Recumbant Bike   Level 2   Watts 30   Minutes 15   NuStep   Level 3   Watts 40   Minutes 15   Arm Ergometer   Level 1   Watts 10   Minutes 15   Arm/Foot Ergometer   Level 1   Watts 15   Minutes 15   Cybex   Level 2   RPM 20   Minutes 15   Recumbant Elliptical   Level 2   RPM 40   Watts 20   Minutes 15   Elliptical   Level 1   Speed 2.5   Minutes 5   REL-XR   Level 3   Watts 40   Minutes 15   Prescription Details   Frequency (times per week) 3   Duration Progress to 30 minutes of continuous aerobic without signs/symptoms of physical distress   Intensity   THRR REST +  30   Ratings of Perceived Exertion 11-13   Progression Continue progressive overload as per policy without signs/symptoms or physical distress.   Resistance Training   Training Prescription Yes   Weight 2   Reps 10-12      Exercise Prescription Changes:   Discharge Exercise Prescription (Final Exercise Prescription Changes):   Nutrition:  Target Goals: Understanding of nutrition guidelines, daily intake of sodium 1500mg , cholesterol 200mg , calories 30% from fat and 7% or less from saturated fats, daily to have 5 or more servings of fruits and vegetables.  Biometrics:     Pre Biometrics - 09/26/14 1307    Pre Biometrics   Height 6' (1.829 m)   Weight 211 lb 9.6 oz (95.981 kg)   Waist Circumference 42.75 inches   Hip Circumference 41 inches   Waist to Hip Ratio 1.04 %   BMI (Calculated) 28.8       Nutrition Therapy Plan and Nutrition Goals:   Nutrition Discharge: Rate Your Plate Scores:   Nutrition Goals Re-Evaluation:   Psychosocial: Target Goals: Acknowledge presence or absence of depression,  maximize coping skills, provide positive support system. Participant is able to verbalize types and ability to use techniques and skills needed for reducing stress and depression.  Initial  Review & Psychosocial Screening:     Initial Psych Review & Screening - 09/26/14 1201    Family Dynamics   Good Support System? Yes   Comments Lives alone  separated for over 4 years      Quality of Life Scores:     Quality of Life - 09/26/14 1300    Quality of Life Scores   Health/Function Pre 22.4 %   Socioeconomic Pre 28.93 %   Psych/Spiritual Pre 25.71 %   Family Pre 24 %   GLOBAL Pre 24.66 %      PHQ-9:     Recent Review Flowsheet Data    Depression screen [facility] 2/9 09/26/2014   Decreased Interest 1   Down, Depressed, Hopeless 0   PHQ - 2 Score 1   Altered sleeping 1   Tired, decreased energy 1   Change in appetite 0   Feeling bad or failure about yourself  0   Trouble concentrating 0   Moving slowly or fidgety/restless 0   Suicidal thoughts 0   PHQ-9 Score 3   Difficult doing work/chores Somewhat difficult      Psychosocial Evaluation and Intervention:   Psychosocial Re-Evaluation:   Vocational Rehabilitation: Provide vocational rehab assistance to qualifying candidates.   Vocational Rehab Evaluation & Intervention:     Vocational Rehab - 09/26/14 1158    Initial Vocational Rehab Evaluation & Intervention   Assessment shows need for Vocational Rehabilitation No      Education: Education Goals: Education classes will be provided on a weekly basis, covering required topics. Participant will state understanding/return demonstration of topics presented.  Learning Barriers/Preferences:     Learning Barriers/Preferences - 09/26/14 1158    Learning Barriers/Preferences   Learning Barriers None   Learning Preferences None      Education Topics: General Nutrition Guidelines/Fats and Fiber: -Group instruction provided by verbal, written material, models and  posters to present the general guidelines for heart healthy nutrition. Gives an explanation and review of dietary fats and fiber.   Controlling Sodium/Reading Food Labels: -Group verbal and written material supporting the discussion of sodium use in heart healthy nutrition. Review and explanation with models, verbal and written materials for utilization of the food label.   Exercise Physiology & Risk Factors: - Group verbal and written instruction with models to review the exercise physiology of the cardiovascular system and associated critical values. Details cardiovascular disease risk factors and the goals associated with each risk factor.   Aerobic Exercise & Resistance Training: - Gives group verbal and written discussion on the health impact of inactivity. On the components of aerobic and resistive training programs and the benefits of this training and how to safely progress through these programs.   Flexibility, Balance, General Exercise Guidelines: - Provides group verbal and written instruction on the benefits of flexibility and balance training programs. Provides general exercise guidelines with specific guidelines to those with heart or lung disease. Demonstration and skill practice provided.   Stress Management: - Provides group verbal and written instruction about the health risks of elevated stress, cause of high stress, and healthy ways to reduce stress.   Depression: - Provides group verbal and written instruction on the correlation between heart/lung disease and depressed mood, treatment options, and the stigmas associated with seeking treatment.   Anatomy & Physiology of the Heart: - Group verbal and written instruction and models provide basic cardiac anatomy and physiology, with the coronary electrical and arterial systems. Review of: AMI, Angina, Valve disease, Heart Failure,  Cardiac Arrhythmia, Pacemakers, and the ICD.   Cardiac Procedures: - Group verbal and  written instruction and models to describe the testing methods done to diagnose heart disease. Reviews the outcomes of the test results. Describes the treatment choices: Medical Management, Angioplasty, or Coronary Bypass Surgery.   Cardiac Medications: - Group verbal and written instruction to review commonly prescribed medications for heart disease. Reviews the medication, class of the drug, and side effects. Includes the steps to properly store meds and maintain the prescription regimen.   Go Sex-Intimacy & Heart Disease, Get SMART - Goal Setting: - Group verbal and written instruction through game format to discuss heart disease and the return to sexual intimacy. Provides group verbal and written material to discuss and apply goal setting through the application of the S.M.A.R.T. Method.   Other Matters of the Heart: - Provides group verbal, written materials and models to describe Heart Failure, Angina, Valve Disease, and Diabetes in the realm of heart disease. Includes description of the disease process and treatment options available to the cardiac patient.   Exercise & Equipment Safety: - Individual verbal instruction and demonstration of equipment use and safety with use of the equipment.          Cardiac Rehab from 09/26/2014 in [facility] Cardiac Rehab   Date  09/26/14   Educator  SB   Instruction Review Code  2- meets goals/outcomes      Infection Prevention: - Provides verbal and written material to individual with discussion of infection control including proper hand washing and proper equipment cleaning during exercise session.      Cardiac Rehab from 09/26/2014 in [facility] Cardiac Rehab   Date  09/26/14   Educator  SB   Instruction Review Code  2- meets goals/outcomes      Falls Prevention: - Provides verbal and written material to individual with discussion of falls prevention and safety.      Cardiac Rehab from 09/26/2014 in [facility] Cardiac Rehab   Date  09/26/14   Educator   SB   Instruction Review Code  2- meets goals/outcomes      Diabetes: - Individual verbal and written instruction to review signs/symptoms of diabetes, desired ranges of glucose level fasting, after meals and with exercise. Advice that pre and post exercise glucose checks will be done for 3 sessions at entry of program.    Knowledge Questionnaire Score:     Knowledge Questionnaire Score - 09/26/14 1300    Knowledge Questionnaire Score   Pre Score 19/28      Personal Goals and Risk Factors at Admission:     Personal Goals and Risk Factors at Admission - 09/26/14 1202    Personal Goals and Risk Factors on Admission   Increase Aerobic Exercise and Physical Activity Yes;Sedentary   Intervention While in program, learn and follow the exercise prescription taught. Start at a low level workload and increase workload after able to maintain previous level for 30 minutes. Increase time before increasing intensity.   Intervention Provide exercise education and an individualized exercise prescription that will provide continued progressive overload as per policy without signs/symptoms of physical distress.   Quit Smoking Yes   Number of packs per day 3 to 4 cigarettes a day   Intervention Utilize your health care professional team to help with smoking cessation while in the program. Your doctor can prescribe medications to aid in cessation. The program can provide information and counseling as needed.   Take Less Medication Yes   Intervention Learn  your risk factors and begin the lifestyle modifications for risk factor control during your time in the program.   Diabetes No   Hypertension Yes   Goal Participant will see blood pressure controlled within the values of 140/8mm/Hg or within value directed by their physician.   Intervention Provide nutrition & aerobic exercise along with prescribed medications to achieve BP 140/90 or less.   Lipids Yes   Goal Cholesterol controlled with  medications as prescribed, with individualized exercise RX and with personalized nutrition plan. Value goals: LDL < 70mg , HDL > 40mg . Participant states understanding of desired cholesterol values and following prescriptions.   Intervention Provide nutrition & aerobic exercise along with prescribed medications to achieve LDL 70mg , HDL >40mg .      Personal Goals and Risk Factors Review:    Personal Goals Discharge (Final Personal Goals and Risk Factors Review):     Comments: Initial ITP To start sessions next week.

## 2014-09-26 NOTE — Patient Instructions (Signed)
Patient Instructions  Patient Details  Name: Martin Adkins MRN: [id number] Date of Birth: Mar 11, 1945 Referring Provider:  Corey Skains, MD  Below are the personal goals you chose as well as exercise and nutrition goals. Our goal is to help you keep on track towards obtaining and maintaining your goals. We will be discussing your progress on these goals with you throughout the program.  Initial Exercise Prescription:     Initial Exercise Prescription - 09/26/14 1300    Date of Initial Exercise Prescription   Date 09/26/14   Treadmill   MPH 2.5   Grade 0   Minutes 15   Bike   Level 1   Minutes 15   Recumbant Bike   Level 2   Watts 30   Minutes 15   NuStep   Level 3   Watts 40   Minutes 15   Arm Ergometer   Level 1   Watts 10   Minutes 15   Arm/Foot Ergometer   Level 1   Watts 15   Minutes 15   Cybex   Level 2   RPM 20   Minutes 15   Recumbant Elliptical   Level 2   RPM 40   Watts 20   Minutes 15   Elliptical   Level 1   Speed 2.5   Minutes 5   REL-XR   Level 3   Watts 40   Minutes 15   Prescription Details   Frequency (times per week) 3   Duration Progress to 30 minutes of continuous aerobic without signs/symptoms of physical distress   Intensity   THRR REST +  30   Ratings of Perceived Exertion 11-13   Progression Continue progressive overload as per policy without signs/symptoms or physical distress.   Resistance Training   Training Prescription Yes   Weight 2   Reps 10-12      Exercise Goals: Frequency: Be able to perform aerobic exercise three times per week working toward 3-5 days per week.  Intensity: Work with a perceived exertion of 11 (fairly light) - 15 (hard) as tolerated. Follow your new exercise prescription and watch for changes in prescription as you progress with the program. Changes will be reviewed with you when they are made.  Duration: You should be able to do 30 minutes of continuous aerobic exercise in addition to  a 5 minute warm-up and a 5 minute cool-down routine.  Nutrition Goals: Your personal nutrition goals will be established when you do your nutrition analysis with the dietician.  The following are nutrition guidelines to follow: Cholesterol < 200mg /day Sodium < 1500mg /day Fiber: Men over 50 yrs - 30 grams per day  Personal Goals:     Personal Goals and Risk Factors at Admission - 09/26/14 1202    Personal Goals and Risk Factors on Admission   Increase Aerobic Exercise and Physical Activity Yes;Sedentary   Intervention While in program, learn and follow the exercise prescription taught. Start at a low level workload and increase workload after able to maintain previous level for 30 minutes. Increase time before increasing intensity.   Intervention Provide exercise education and an individualized exercise prescription that will provide continued progressive overload as per policy without signs/symptoms of physical distress.   Quit Smoking Yes   Number of packs per day 3 to 4 cigarettes a day   Intervention Utilize your health care professional team to help with smoking cessation while in the program. Your doctor can prescribe medications to aid in  cessation. The program can provide information and counseling as needed.   Take Less Medication Yes   Intervention Learn your risk factors and begin the lifestyle modifications for risk factor control during your time in the program.   Diabetes No   Hypertension Yes   Goal Participant will see blood pressure controlled within the values of 140/57mm/Hg or within value directed by their physician.   Intervention Provide nutrition & aerobic exercise along with prescribed medications to achieve BP 140/90 or less.   Lipids Yes   Goal Cholesterol controlled with medications as prescribed, with individualized exercise RX and with personalized nutrition plan. Value goals: LDL < 70mg , HDL > 40mg . Participant states understanding of desired cholesterol  values and following prescriptions.   Intervention Provide nutrition & aerobic exercise along with prescribed medications to achieve LDL 70mg , HDL >40mg .      Tobacco Use Initial Evaluation: History  Smoking status  . Current Some Day Smoker -- 3.00 packs/day for 20 years  Smokeless tobacco  . Not on file    Copy of goals given to participant.

## 2014-10-03 ENCOUNTER — Ambulatory Visit: Payer: Medicare PPO | Admitting: Urology

## 2014-10-03 ENCOUNTER — Encounter: Payer: Medicare PPO | Admitting: *Deleted

## 2014-10-03 DIAGNOSIS — I2119 ST elevation (STEMI) myocardial infarction involving other coronary artery of inferior wall: Secondary | ICD-10-CM

## 2014-10-03 NOTE — Progress Notes (Signed)
Daily Session Note  Patient Details  Name: Martin Adkins MRN: [id number] Date of Birth: 02/28/1945 Referring Provider:  Kowalski, Bruce J, MD  Encounter Date: 10/03/2014  Check In:     Session Check In - 10/03/14 0835    Check-In   Staff Present Kelly Hayes BS, ACSM CEP Exercise Physiologist;Renee MacMillan MS, ACSM CEP Exercise Physiologist;Susanne Bice RN, BSN, CCRP   Medication changes reported     No   Fall or balance concerns reported    No   Warm-up and Cool-down Performed on first and last piece of equipment   VAD Patient? No   Pain Assessment   Currently in Pain? No/denies   Multiple Pain Sites No         Goals Met:  Independence with exercise equipment Exercise tolerated well No report of cardiac concerns or symptoms Strength training completed today  Goals Unmet:  Not Applicable  Goals Comments: Today was the patient's first day in class. The patient's initial exercise prescription was reviewed. The patient was able to tolerate his exercise workloads which are based off of his 6 min walk evaluation.    Dr. Mark Miller is Medical Director for [facility] and [facility]. 

## 2014-10-05 ENCOUNTER — Ambulatory Visit (INDEPENDENT_AMBULATORY_CARE_PROVIDER_SITE_OTHER): Payer: Medicare PPO | Admitting: Urology

## 2014-10-05 ENCOUNTER — Encounter: Payer: Self-pay | Admitting: Urology

## 2014-10-05 VITALS — BP 107/72 | HR 75 | Ht 72.0 in | Wt 213.8 lb

## 2014-10-05 DIAGNOSIS — I2119 ST elevation (STEMI) myocardial infarction involving other coronary artery of inferior wall: Secondary | ICD-10-CM

## 2014-10-05 DIAGNOSIS — R32 Unspecified urinary incontinence: Secondary | ICD-10-CM | POA: Diagnosis not present

## 2014-10-05 LAB — BLADDER SCAN AMB NON-IMAGING: Scan Result: 45

## 2014-10-05 MED ORDER — MIRABEGRON ER 25 MG PO TB24
25.0000 mg | ORAL_TABLET | Freq: Every day | ORAL | Status: DC
Start: 2014-10-05 — End: 2017-03-21

## 2014-10-05 NOTE — Progress Notes (Signed)
10/05/2014 6:56 PM   Martin Adkins Dec 20, 1945 [id number]  Referring provider: Madelyn Brunner, MD [place], [place] [address]  Chief Complaint  Patient presents with  . Urinary Incontinence    6 week follow up    HPI: Patient is a 69 year old African American male who was initiated on oxybutynin 6 weeks ago for his urge incontinence.  He presents today to discuss his response to the medication.    He states he noted no difference while taking the oxybutynin.  His IPSS score today was 9/5.  His PVR was 45 mL.  He is not experiencing any dysuria, gross hematuria or suprapubic pain.  I reviewed his liquid intake and diet and he is does not appear to be consuming foods or liquids that would irritate his bladder.    He states he feels fine for the exception of the urge incontinence.     PMH: Past Medical History  Diagnosis Date  . Hyperlipemia   . RA (rheumatoid arthritis)   . OSA (obstructive sleep apnea)   . PVD (peripheral vascular disease)   . GERD (gastroesophageal reflux disease)   . Osteoarthritis   . NSTEMI (non-ST elevated myocardial infarction)   . Hyperplastic polyps of stomach   . Barrett's esophagus   . Diabetes   . DVT (deep venous thrombosis)   . Abdominal aortic aneurysm without rupture   . CAD (coronary artery disease)   . SOB (shortness of breath)   . CHF (congestive heart failure)   . Hypertension   . Arthritis   . BPH (benign prostatic hyperplasia)     Surgical History: Past Surgical History  Procedure Laterality Date  . Coronary angioplasty with stent placement  2015  . Coronary artery bypass graft    . Abdominal aorta stent    . Peripheral vascular catheterization N/A 09/06/2014    Procedure: IVC Filter Removal;  Surgeon: Katha Cabal, MD;  Location: [facility];  Service: Cardiovascular;  Laterality: N/A;    Home Medications:    Medication List       This list is accurate as of: 10/05/14 11:59 PM.   Always use your most recent med list.               amLODipine 5 MG tablet  Commonly known as:  NORVASC  10 mg daily.     aspirin EC 81 MG tablet  Take by mouth.     aspirin 81 MG tablet  Take 1 tablet by mouth daily.     atorvastatin 80 MG tablet  Commonly known as:  LIPITOR  TAKE 1 TABLET (80 MG TOTAL) BY MOUTH ONCE DAILY.     benazepril 20 MG tablet  Commonly known as:  LOTENSIN  Take 1 tablet by mouth daily.     chlorthalidone 25 MG tablet  Commonly known as:  HYGROTON  Take 1 tablet by mouth daily.     clopidogrel 75 MG tablet  Commonly known as:  PLAVIX  Take by mouth.     enoxaparin 100 MG/ML injection  Commonly known as:  LOVENOX  INJECT 1 ML (100 MG TOTAL) SUBCUTANEOUSLY EVERY 12 (TWELVE) HOURS.     fluticasone 50 MCG/ACT nasal spray  Commonly known as:  FLONASE     Heat Therapy Patches Misc     hydroxychloroquine 200 MG tablet  Commonly known as:  PLAQUENIL     isosorbide mononitrate 30 MG 24 hr tablet  Commonly known as:  IMDUR     LUBRICANT DROPS OP  Place 1 drop into both eyes daily.     LUBRICANT EYE DROPS 0.4-0.3 % Soln  Generic drug:  Polyethyl Glycol-Propyl Glycol     metoprolol succinate 100 MG 24 hr tablet  Commonly known as:  TOPROL-XL     mirabegron ER 25 MG Tb24 tablet  Commonly known as:  MYRBETRIQ  Take 1 tablet (25 mg total) by mouth daily.     Naphazoline-Polyethyl Glycol 0.012-0.2 % Soln  Place 1 drop into both eyes daily.     oxybutynin 10 MG 24 hr tablet  Commonly known as:  DITROPAN-XL  Take 1 tablet (10 mg total) by mouth daily.     pantoprazole 40 MG tablet  Commonly known as:  PROTONIX     potassium chloride SA 20 MEQ tablet  Commonly known as:  K-DUR,KLOR-CON     valsartan 160 MG tablet  Commonly known as:  DIOVAN  Take 160 mg by mouth daily.     warfarin 2.5 MG tablet  Commonly known as:  COUMADIN  2.5 mg. 5 mg one day alternate with 7 mg next day     warfarin 5 MG tablet  Commonly known as:  COUMADIN   TAKE 1 TABLET (5 MG TOTAL) BY MOUTH ONCE DAILY.        Allergies:  Allergies  Allergen Reactions  . Ace Inhibitors Other (See Comments)    Other reaction(s): Cough  . Niacin Other (See Comments)  . Pravastatin Other (See Comments)    Other reaction(s): Muscle Pain  . Rosuvastatin Other (See Comments)    Other reaction(s): Other (See Comments) GI bleed  . Simvastatin Other (See Comments)    Other reaction(s): Muscle Pain  . Amoxicillin Rash  . Penicillins Rash    Family History: Family History  Problem Relation Age of Onset  . Bladder Cancer Neg Hx   . Prostate cancer Neg Hx   . Kidney cancer Neg Hx     Social History:  reports that he has been smoking.  He does not have any smokeless tobacco history on file. He reports that he drinks alcohol. He reports that he does not use illicit drugs.  ROS: UROLOGY Frequent Urination?: No Hard to postpone urination?: Yes Burning/pain with urination?: No Get up at night to urinate?: Yes Leakage of urine?: No Urine stream starts and stops?: No Trouble starting stream?: No Do you have to strain to urinate?: No Blood in urine?: No Urinary tract infection?: No Sexually transmitted disease?: No Injury to kidneys or bladder?: No Painful intercourse?: No Weak stream?: No Erection problems?: No Penile pain?: No  Gastrointestinal Nausea?: No Vomiting?: No Indigestion/heartburn?: No Diarrhea?: No Constipation?: No  Constitutional Fever: No Night sweats?: No Weight loss?: No Fatigue?: No  Skin Skin rash/lesions?: No Itching?: No  Eyes Blurred vision?: No Double vision?: No  Ears/Nose/Throat Sore throat?: No Sinus problems?: Yes  Hematologic/Lymphatic Swollen glands?: No Easy bruising?: Yes  Cardiovascular Leg swelling?: No Chest pain?: No  Respiratory Cough?: No Shortness of breath?: No  Endocrine Excessive thirst?: No  Musculoskeletal Back pain?: Yes Joint pain?: Yes  Neurological Headaches?:  No Dizziness?: No  Psychologic Depression?: No Anxiety?: No  Physical Exam: BP 107/72 mmHg  Pulse 75  Ht 6' (1.829 m)  Wt 213 lb 12.8 oz (96.979 kg)  BMI 28.99 kg/m2    Laboratory Data: Lab Results  Component Value Date   WBC 10.0 07/30/2014   HGB 15.4 07/30/2014   HCT 47.4 07/30/2014  MCV 79.6* 07/30/2014   PLT 189 07/30/2014    Lab Results  Component Value Date   CREATININE 1.06 07/30/2014   PSA History:  0.43 ng/mL on 12/01/2013    Pertinent Imaging: Results for Martin, Adkins (MRN [id number]) as of 10/05/2014 11:39  Ref. Range 10/05/2014 11:23  Scan Result Unknown 45    Assessment & Plan:    1. Urge incontinence of urine:  Patient did not find any relief from the urge incontinence with the oxybutynin.  We discussed trying a different medication and  non medicinal therapies.  He would like to try a different medication at this time before pursuing non medicinal therapy.  I have given him samples of Myrbetriq 50 mg (#21) and Myrbetriq 25 mg (#14) to take daily (50 mg) daily and to RTC to clinic in 3 weeks for a PVR and IPSS score.  I have advised the patient of the side effects of Myrbetriq, such as: elevation in BP, urinary retention and/or HA.  He is concerned about the expense of the medication, but his insurance may cover the medication with a prior authorization.  -BLADDER SCAN AMB NON-IMAGING  2. Other male erectile dysfunction History of refractory erectile dysfunction, not currently a candidate for PDE 5 inhibitors. May discuss intracavernosal injections in the future.  3. Peyronie disease Stable curvature, minimal bother.  Return in about 3 weeks (around 10/26/2014) for PVR and office visit.  Zara Council, [facility] [address] [place], [place] [address] [phone]

## 2014-10-05 NOTE — Progress Notes (Signed)
Daily Session Note  Patient Details  Name: SCOTTI KOSTA MRN: [id number] Date of Birth: 1945/04/05 Referring Provider:  Corey Skains, MD  Encounter Date: 10/05/2014  Check In:     Session Check In - 10/05/14 0907    Check-In   Staff Present Heath Lark RN, BSN, CCRP;Renee Dillard Essex MS, ACSM CEP Exercise Physiologist;Other   ER physicians immediately available to respond to emergencies See telemetry face sheet for immediately available ER MD   Medication changes reported     No   Fall or balance concerns reported    No   Warm-up and Cool-down Performed on first and last piece of equipment   VAD Patient? No   Pain Assessment   Currently in Pain? No/denies         Goals Met:  Independence with exercise equipment Exercise tolerated well No report of cardiac concerns or symptoms Strength training completed today  Goals Unmet:  Not Applicable  Goals Comments: Progressing well with his exercise prescription.    Dr. Emily Filbert is Medical Director for [facility] and [facility].

## 2014-10-07 DIAGNOSIS — N3941 Urge incontinence: Secondary | ICD-10-CM

## 2014-10-07 DIAGNOSIS — I2119 ST elevation (STEMI) myocardial infarction involving other coronary artery of inferior wall: Secondary | ICD-10-CM | POA: Diagnosis not present

## 2014-10-07 NOTE — Progress Notes (Signed)
Daily Session Note  Patient Details  Name: BASIL BUFFIN MRN: [id number] Date of Birth: [birth year] Referring Provider:  Corey Skains, MD  Encounter Date: 10/07/2014  Check In:     Session Check In - 10/07/14 0901    Check-In   Staff Present Candiss Norse MS, ACSM CEP Exercise Physiologist;Carroll Enterkin RN, BSN;Other   ER physicians immediately available to respond to emergencies See telemetry face sheet for immediately available ER MD   Medication changes reported     Yes   Comments see med list   Fall or balance concerns reported    No   Warm-up and Cool-down Performed on first and last piece of equipment   VAD Patient? No   Pain Assessment   Currently in Pain? No/denies         Goals Met:  Independence with exercise equipment Exercise tolerated well No report of cardiac concerns or symptoms Strength training completed today  Goals Unmet:  Not Applicable  Goals Comments:    Dr. Emily Filbert is Medical Director for [facility] and [facility].

## 2014-10-09 DIAGNOSIS — R32 Unspecified urinary incontinence: Secondary | ICD-10-CM | POA: Insufficient documentation

## 2014-10-10 ENCOUNTER — Encounter: Payer: Medicare PPO | Attending: Internal Medicine | Admitting: *Deleted

## 2014-10-10 DIAGNOSIS — I2119 ST elevation (STEMI) myocardial infarction involving other coronary artery of inferior wall: Secondary | ICD-10-CM

## 2014-10-10 NOTE — Progress Notes (Signed)
Daily Session Note  Patient Details  Name: JAXSON ANGLIN MRN: [id number] Date of Birth: [birth year] Referring Provider:  Corey Skains, MD  Encounter Date: 10/10/2014  Check In:     Session Check In - 10/10/14 0835    Check-In   Staff Present Candiss Norse MS, ACSM CEP Exercise Physiologist;Axil Copeman Alfonso Patten, ACSM CEP Exercise Physiologist;Carroll Enterkin RN, BSN   ER physicians immediately available to respond to emergencies See telemetry face sheet for immediately available ER MD   Medication changes reported     No   Fall or balance concerns reported    No   Warm-up and Cool-down Performed on first and last piece of equipment   VAD Patient? No   Pain Assessment   Currently in Pain? No/denies   Multiple Pain Sites No         Goals Met:  Independence with exercise equipment Exercise tolerated well No report of cardiac concerns or symptoms Strength training completed today  Goals Unmet:  Not Applicable  Goals Comments:    Dr. Emily Filbert is Medical Director for [facility].

## 2014-10-12 DIAGNOSIS — I2119 ST elevation (STEMI) myocardial infarction involving other coronary artery of inferior wall: Secondary | ICD-10-CM | POA: Diagnosis not present

## 2014-10-12 NOTE — Progress Notes (Signed)
Daily Session Note  Patient Details  Name: Martin Adkins MRN: [id number] Date of Birth: Feb 19, 1945 Referring Provider:  Corey Skains, MD  Encounter Date: 10/12/2014  Check In:     Session Check In - 10/12/14 0825    Check-In   Staff Present Nyoka Cowden RN;Renee Dillard Essex MS, ACSM CEP Exercise Physiologist;Laloni Rowton BS, ACSM EP-C, Exercise Physiologist   ER physicians immediately available to respond to emergencies See telemetry face sheet for immediately available ER MD   Medication changes reported     No   Fall or balance concerns reported    No   Warm-up and Cool-down Performed on first and last piece of equipment   VAD Patient? No   Pain Assessment   Currently in Pain? No/denies         Goals Met:  Proper associated with RPD/PD & O2 Sat Exercise tolerated well No report of cardiac concerns or symptoms Strength training completed today  Goals Unmet:  Not Applicable  Goals Comments:    Dr. Emily Filbert is Medical Director for [place] and [facility].

## 2014-10-14 ENCOUNTER — Encounter: Payer: Medicare PPO | Admitting: *Deleted

## 2014-10-14 DIAGNOSIS — I2119 ST elevation (STEMI) myocardial infarction involving other coronary artery of inferior wall: Secondary | ICD-10-CM

## 2014-10-14 NOTE — Progress Notes (Signed)
Daily Session Note  Patient Details  Name: Martin Adkins MRN: [id number] Date of Birth: 1945/08/19 Referring Provider:  Corey Skains, MD  Encounter Date: 10/14/2014  Check In:     Session Check In - 10/14/14 0926    Check-In   Staff Present Diane Joya Gaskins RN, BSN;Lavaya Defreitas Dillard Essex MS, ACSM CEP Exercise Physiologist;Other   ER physicians immediately available to respond to emergencies See telemetry face sheet for immediately available ER MD   Medication changes reported     No   Fall or balance concerns reported    No   VAD Patient? No   Pain Assessment   Currently in Pain? No/denies   Multiple Pain Sites No         Goals Met:  Independence with exercise equipment Exercise tolerated well No report of cardiac concerns or symptoms Strength training completed today  Goals Unmet:  Not Applicable  Goals Comments: Made copies of each rehab session's data for [name] to take to MD   Dr. Emily Filbert is Medical Director for [facility] and [facility].

## 2014-10-17 ENCOUNTER — Encounter: Payer: Medicare PPO | Admitting: *Deleted

## 2014-10-17 DIAGNOSIS — I2119 ST elevation (STEMI) myocardial infarction involving other coronary artery of inferior wall: Secondary | ICD-10-CM

## 2014-10-17 NOTE — Progress Notes (Signed)
Daily Session Note  Patient Details  Name: Martin Adkins MRN: [id number] Date of Birth: 06/30/1945 Referring Provider:  Corey Skains, MD  Encounter Date: 10/17/2014  Check In:     Session Check In - 10/17/14 0853    Check-In   Staff Present Candiss Norse MS, ACSM CEP Exercise Physiologist;Carroll Enterkin RN, BSN;Kelly Alfonso Patten, ACSM CEP Exercise Physiologist   ER physicians immediately available to respond to emergencies See telemetry face sheet for immediately available ER MD   Medication changes reported     No   Fall or balance concerns reported    No   Warm-up and Cool-down Performed on first and last piece of equipment   VAD Patient? No   Pain Assessment   Currently in Pain? No/denies   Multiple Pain Sites No           Exercise Prescription Changes - 10/17/14 0800    Exercise Review   Progression Yes   Response to Exercise   Blood Pressure (Admit) 128/70 mmHg   Blood Pressure (Exercise) 156/82 mmHg   Blood Pressure (Exit) 126/72 mmHg   Heart Rate (Admit) 74 bpm   Heart Rate (Exercise) 91 bpm   Heart Rate (Exit) 72 bpm   Rating of Perceived Exertion (Exercise) 11   Symptoms None   Comments Reviewed individualized exercise prescription and made increases per departmental policy. Exercise increases were discussed with the patient and they were able to perform the new work loads without issue (no signs or symptoms). Martin Adkins is doing well in class and is enjoying the program.    Duration Progress to 50 minutes of aerobic without signs/symptoms of physical distress   Intensity Rest + 30   Progression Continue progressive overload as per policy without signs/symptoms or physical distress.   Resistance Training   Training Prescription Yes   Weight 3   Reps 10-15   Interval Training   Interval Training No   Treadmill   MPH 2.7   Grade 0   Minutes 20   Recumbant Elliptical   Level 3  BioStep   Watts 30   Minutes 20   REL-XR   Level 6   Watts 65   Minutes 15      Goals Met:  Independence with exercise equipment Exercise tolerated well Personal goals reviewed No report of cardiac concerns or symptoms Strength training completed today  Goals Unmet:  Not Applicable  Goals Comments: Reviewed individualized exercise prescription and made increases per departmental policy. Exercise increases were discussed with the patient and they were able to perform the new work loads without issue (no signs or symptoms).     Dr. Emily Filbert is Medical Director for [facility].

## 2014-10-19 DIAGNOSIS — I2119 ST elevation (STEMI) myocardial infarction involving other coronary artery of inferior wall: Secondary | ICD-10-CM | POA: Diagnosis not present

## 2014-10-19 NOTE — Progress Notes (Signed)
Cardiac Individual Treatment Plan  Patient Details  Name: Martin Adkins MRN: [id number] Date of Birth: 11-26-1945 Referring Provider:  Corey Skains, MD  Initial Encounter Date:    Visit Diagnosis: ST elevation myocardial infarction (STEMI) involving other coronary artery of inferior wall ([place])  Patient's Home Medications on Admission:  Current outpatient prescriptions:  .  amLODipine (NORVASC) 5 MG tablet, 10 mg daily. , Disp: , Rfl:  .  aspirin 81 MG tablet, Take 1 tablet by mouth daily., Disp: , Rfl:  .  aspirin EC 81 MG tablet, Take by mouth., Disp: , Rfl:  .  atorvastatin (LIPITOR) 80 MG tablet, TAKE 1 TABLET (80 MG TOTAL) BY MOUTH ONCE DAILY., Disp: , Rfl: 11 .  benazepril (LOTENSIN) 20 MG tablet, Take 1 tablet by mouth daily., Disp: , Rfl:  .  chlorthalidone (HYGROTON) 25 MG tablet, Take 1 tablet by mouth daily., Disp: , Rfl:  .  clopidogrel (PLAVIX) 75 MG tablet, Take by mouth., Disp: , Rfl:  .  enoxaparin (LOVENOX) 100 MG/ML injection, INJECT 1 ML (100 MG TOTAL) SUBCUTANEOUSLY EVERY 12 (TWELVE) HOURS., Disp: , Rfl: 1 .  fluticasone (FLONASE) 50 MCG/ACT nasal spray, , Disp: , Rfl:  .  Heat Wraps (HEAT THERAPY PATCHES) MISC, , Disp: , Rfl:  .  hydroxychloroquine (PLAQUENIL) 200 MG tablet, , Disp: , Rfl:  .  isosorbide mononitrate (IMDUR) 30 MG 24 hr tablet, , Disp: , Rfl:  .  LUBRICANT EYE DROPS 0.4-0.3 % SOLN, , Disp: , Rfl:  .  metoprolol succinate (TOPROL-XL) 100 MG 24 hr tablet, , Disp: , Rfl:  .  mirabegron ER (MYRBETRIQ) 25 MG TB24 tablet, Take 1 tablet (25 mg total) by mouth daily., Disp: 30 tablet, Rfl: 0 .  Naphazoline-Polyethyl Glycol 0.012-0.2 % SOLN, Place 1 drop into both eyes daily., Disp: , Rfl:  .  oxybutynin (DITROPAN-XL) 10 MG 24 hr tablet, Take 1 tablet (10 mg total) by mouth daily. (Patient not taking: Reported on 10/07/2014), Disp: 30 tablet, Rfl: 11 .  pantoprazole (PROTONIX) 40 MG tablet, , Disp: , Rfl:  .  Polyvinyl Alcohol (LUBRICANT DROPS OP),  Place 1 drop into both eyes daily., Disp: , Rfl:  .  potassium chloride SA (K-DUR,KLOR-CON) 20 MEQ tablet, , Disp: , Rfl:  .  valsartan (DIOVAN) 160 MG tablet, Take 160 mg by mouth daily., Disp: , Rfl:  .  warfarin (COUMADIN) 2.5 MG tablet, 2.5 mg. 5 mg one day alternate with 7 mg next day, Disp: , Rfl:  .  warfarin (COUMADIN) 5 MG tablet, TAKE 1 TABLET (5 MG TOTAL) BY MOUTH ONCE DAILY., Disp: , Rfl: 5  Past Medical History: Past Medical History  Diagnosis Date  . Hyperlipemia   . RA (rheumatoid arthritis)   . OSA (obstructive sleep apnea)   . PVD (peripheral vascular disease)   . GERD (gastroesophageal reflux disease)   . Osteoarthritis   . NSTEMI (non-ST elevated myocardial infarction)   . Hyperplastic polyps of stomach   . Barrett's esophagus   . Diabetes   . DVT (deep venous thrombosis)   . Abdominal aortic aneurysm without rupture   . CAD (coronary artery disease)   . SOB (shortness of breath)   . CHF (congestive heart failure)   . Hypertension   . Arthritis   . BPH (benign prostatic hyperplasia)     Tobacco Use: History  Smoking status  . Current Some Day Smoker -- 3.00 packs/day for 20 years  Smokeless tobacco  . Not on file  Labs: Recent Review Flowsheet Data    Labs for ITP Cardiac and Pulmonary Rehab Latest Ref Rng 05/03/2013   Cholestrol 0-200 mg/dL 196   LDLCALC 0-100 mg/dL 121(H)   HDL 40-60 mg/dL 42   Trlycerides 0-200 mg/dL 164       Exercise Target Goals:    Exercise Program Goal: Individual exercise prescription set with THRR, safety & activity barriers. Participant demonstrates ability to understand and report RPE using BORG scale, to self-measure pulse accurately, and to acknowledge the importance of the exercise prescription.  Exercise Prescription Goal: Starting with aerobic activity 30 plus minutes a day, 3 days per week for initial exercise prescription. Provide home exercise prescription and guidelines that participant acknowledges  understanding prior to discharge.  Activity Barriers & Risk Stratification:     Activity Barriers & Risk Stratification - 09/26/14 1158    Activity Barriers & Risk Stratification   Activity Barriers Arthritis   Risk Stratification High      6 Minute Walk:     6 Minute Walk      09/26/14 1259       6 Minute Walk   Phase Initial     Distance 1330 feet     Walk Time 6 minutes     Resting HR 74 bpm     Resting BP 112/78 mmHg     Max Ex. HR 89 bpm     Max Ex. BP 130/70 mmHg     RPE 11     Symptoms No        Initial Exercise Prescription:     Initial Exercise Prescription - 09/26/14 1300    Date of Initial Exercise Prescription   Date 09/26/14   Treadmill   MPH 2.5   Grade 0   Minutes 15   Bike   Level 1   Minutes 15   Recumbant Bike   Level 2   Watts 30   Minutes 15   NuStep   Level 3   Watts 40   Minutes 15   Arm Ergometer   Level 1   Watts 10   Minutes 15   Arm/Foot Ergometer   Level 1   Watts 15   Minutes 15   Cybex   Level 2   RPM 20   Minutes 15   Recumbant Elliptical   Level 2   RPM 40   Watts 20   Minutes 15   Elliptical   Level 1   Speed 2.5   Minutes 5   REL-XR   Level 3   Watts 40   Minutes 15   Prescription Details   Frequency (times per week) 3   Duration Progress to 30 minutes of continuous aerobic without signs/symptoms of physical distress   Intensity   THRR REST +  30   Ratings of Perceived Exertion 11-13   Progression Continue progressive overload as per policy without signs/symptoms or physical distress.   Resistance Training   Training Prescription Yes   Weight 2   Reps 10-12      Exercise Prescription Changes:     Exercise Prescription Changes      10/11/14 0700 10/17/14 0800         Exercise Review   Progression Yes Yes      Response to Exercise   Blood Pressure (Admit) 128/70 mmHg 128/70 mmHg      Blood Pressure (Exercise) 156/82 mmHg 156/82 mmHg      Blood Pressure (Exit) 126/72 mmHg 126/72 mmHg  Heart Rate (Admit) 74 bpm 74 bpm      Heart Rate (Exercise) 91 bpm 91 bpm      Heart Rate (Exit) 72 bpm 72 bpm      Rating of Perceived Exertion (Exercise) 11 11      Symptoms None None      Comments Reviewed individualized exercise prescription and made increases per departmental policy. Exercise increases were discussed with the patient and they were able to perform the new work loads without issue (no signs or symptoms).  Reviewed individualized exercise prescription and made increases per departmental policy. Exercise increases were discussed with the patient and they were able to perform the new work loads without issue (no signs or symptoms). [name] is doing well in class and is enjoying the program.       Duration Progress to 50 minutes of aerobic without signs/symptoms of physical distress Progress to 50 minutes of aerobic without signs/symptoms of physical distress      Intensity Rest + 30 Rest + 30      Progression Continue progressive overload as per policy without signs/symptoms or physical distress. Continue progressive overload as per policy without signs/symptoms or physical distress.      Resistance Training   Training Prescription Yes Yes      Weight 3 3      Reps 10-15 10-15      Interval Training   Interval Training No No      Treadmill   MPH 2.7 2.7      Grade 0 0      Minutes 20 20      Recumbant Elliptical   Level 3  BioStep 3  BioStep      Watts 30 30      Minutes 20 20      REL-XR   Level 3 6      Watts 60 65      Minutes 15 15         Discharge Exercise Prescription (Final Exercise Prescription Changes):     Exercise Prescription Changes - 10/17/14 0800    Exercise Review   Progression Yes   Response to Exercise   Blood Pressure (Admit) 128/70 mmHg   Blood Pressure (Exercise) 156/82 mmHg   Blood Pressure (Exit) 126/72 mmHg   Heart Rate (Admit) 74 bpm   Heart Rate (Exercise) 91 bpm   Heart Rate (Exit) 72 bpm   Rating of Perceived Exertion  (Exercise) 11   Symptoms None   Comments Reviewed individualized exercise prescription and made increases per departmental policy. Exercise increases were discussed with the patient and they were able to perform the new work loads without issue (no signs or symptoms). [name] is doing well in class and is enjoying the program.    Duration Progress to 50 minutes of aerobic without signs/symptoms of physical distress   Intensity Rest + 30   Progression Continue progressive overload as per policy without signs/symptoms or physical distress.   Resistance Training   Training Prescription Yes   Weight 3   Reps 10-15   Interval Training   Interval Training No   Treadmill   MPH 2.7   Grade 0   Minutes 20   Recumbant Elliptical   Level 3  BioStep   Watts 30   Minutes 20   REL-XR   Level 6   Watts 65   Minutes 15      Nutrition:  Target Goals: Understanding of nutrition guidelines, daily intake of sodium <1519m,  cholesterol <246m, calories 30% from fat and 7% or less from saturated fats, daily to have 5 or more servings of fruits and vegetables.  Biometrics:     Pre Biometrics - 09/26/14 1307    Pre Biometrics   Height 6' (1.829 m)   Weight 211 lb 9.6 oz (95.981 kg)   Waist Circumference 42.75 inches   Hip Circumference 41 inches   Waist to Hip Ratio 1.04 %   BMI (Calculated) 28.8       Nutrition Therapy Plan and Nutrition Goals:   Nutrition Discharge: Rate Your Plate Scores:   Nutrition Goals Re-Evaluation:     Nutrition Goals Re-Evaluation      10/12/14 1852           Personal Goal #1 Re-Evaluation   Personal Goal #1 I asked [email or web address] and he said he prefers not to meet individuallly with the Cardiac Rehab Registered Dietician since he feels he eats healthy now.        Goal Progress Seen Yes          Psychosocial: Target Goals: Acknowledge presence or absence of depression, maximize coping skills, provide positive support system. Participant is able to  verbalize types and ability to use techniques and skills needed for reducing stress and depression.  Initial Review & Psychosocial Screening:     Initial Psych Review & Screening - 09/26/14 1201    Family Dynamics   Good Support System? Yes   Comments Lives alone  separated for over 4 years      Quality of Life Scores:     Quality of Life - 09/26/14 1300    Quality of Life Scores   Health/Function Pre 22.4 %   Socioeconomic Pre 28.93 %   Psych/Spiritual Pre 25.71 %   Family Pre 24 %   GLOBAL Pre 24.66 %      PHQ-9:     Recent Review Flowsheet Data    [facility]   Decreased Interest 1   Down, Depressed, Hopeless 0   PHQ - 2 Score 1   Altered sleeping 1   Tired, decreased energy 1   Change in appetite 0   Feeling bad or failure about yourself  0   Trouble concentrating 0   Moving slowly or fidgety/restless 0   Suicidal thoughts 0   PHQ-9 Score 3   Difficult doing work/chores Somewhat difficult      Psychosocial Evaluation and Intervention:     Psychosocial Evaluation - 10/03/14 1012    Psychosocial Evaluation & Interventions   Interventions Stress management education;Relaxation education;Encouraged to exercise with the program and follow exercise prescription   Comments Counselor met today with Mr. PGoverfor initial psychosocial evaluation.  He is a 69year old who had a heart attack in July and is in this program for recovery.  Mr. PTopperhas a strong support system with an adult daughter and a brother who live close by.  He reports he sleeps well and has a good appetite.  He denies a history of depression or anxiety or any current symptoms.  He has minimal stress in his life and states he is typically in a positive mood.  Mr. PNesheiwatgoals are to increase his stamina and strength and "just to feel better" in this program.  He plans work out through a [employer] following this program.     Continued Psychosocial Services  Needed Yes  Mr. PTuchwill benefit from the psychoeducational components of this  program and consistent exercise.        Psychosocial Re-Evaluation:     Psychosocial Re-Evaluation      10/19/14 1200           Psychosocial Re-Evaluation   Interventions Stress management education;Encouraged to attend Cardiac Rehabilitation for the exercise       Comments Mr. Sheeler has rheumatoid arthritis which is a stressor and makes things harder for him incl mowing his yard. Was able to mow 1/2 of it without stopping which is better.           Vocational Rehabilitation: Provide vocational rehab assistance to qualifying candidates.   Vocational Rehab Evaluation & Intervention:     Vocational Rehab - 09/26/14 1158    Initial Vocational Rehab Evaluation & Intervention   Assessment shows need for Vocational Rehabilitation No      Education: Education Goals: Education classes will be provided on a weekly basis, covering required topics. Participant will state understanding/return demonstration of topics presented.  Learning Barriers/Preferences:     Learning Barriers/Preferences - 09/26/14 1158    Learning Barriers/Preferences   Learning Barriers None   Learning Preferences None      Education Topics: General Nutrition Guidelines/Fats and Fiber: -Group instruction provided by verbal, written material, models and posters to present the general guidelines for heart healthy nutrition. Gives an explanation and review of dietary fats and fiber.          Cardiac Rehab from 10/17/2014 in [facility] Cardiac Rehab   Date  10/17/14   Educator  PI   Instruction Review Code  2- meets goals/outcomes      Controlling Sodium/Reading Food Labels: -Group verbal and written material supporting the discussion of sodium use in heart healthy nutrition. Review and explanation with models, verbal and written materials for utilization of the food label.   Exercise Physiology & Risk Factors: - Group  verbal and written instruction with models to review the exercise physiology of the cardiovascular system and associated critical values. Details cardiovascular disease risk factors and the goals associated with each risk factor.   Aerobic Exercise & Resistance Training: - Gives group verbal and written discussion on the health impact of inactivity. On the components of aerobic and resistive training programs and the benefits of this training and how to safely progress through these programs.   Flexibility, Balance, General Exercise Guidelines: - Provides group verbal and written instruction on the benefits of flexibility and balance training programs. Provides general exercise guidelines with specific guidelines to those with heart or lung disease. Demonstration and skill practice provided.   Stress Management: - Provides group verbal and written instruction about the health risks of elevated stress, cause of high stress, and healthy ways to reduce stress.   Depression: - Provides group verbal and written instruction on the correlation between heart/lung disease and depressed mood, treatment options, and the stigmas associated with seeking treatment.   Anatomy & Physiology of the Heart: - Group verbal and written instruction and models provide basic cardiac anatomy and physiology, with the coronary electrical and arterial systems. Review of: AMI, Angina, Valve disease, Heart Failure, Cardiac Arrhythmia, Pacemakers, and the ICD.      Cardiac Rehab from 10/17/2014 in [facility] Cardiac Rehab   Date  10/03/14   Educator  SB   Instruction Review Code  2- meets goals/outcomes      Cardiac Procedures: - Group verbal and written instruction and models to describe the testing methods done to diagnose heart disease. Reviews the outcomes of  the test results. Describes the treatment choices: Medical Management, Angioplasty, or Coronary Bypass Surgery.   Cardiac Medications: - Group verbal and  written instruction to review commonly prescribed medications for heart disease. Reviews the medication, class of the drug, and side effects. Includes the steps to properly store meds and maintain the prescription regimen.   Go Sex-Intimacy & Heart Disease, Get SMART - Goal Setting: - Group verbal and written instruction through game format to discuss heart disease and the return to sexual intimacy. Provides group verbal and written material to discuss and apply goal setting through the application of the S.M.A.R.T. Method.   Other Matters of the Heart: - Provides group verbal, written materials and models to describe Heart Failure, Angina, Valve Disease, and Diabetes in the realm of heart disease. Includes description of the disease process and treatment options available to the cardiac patient.      Cardiac Rehab from 10/17/2014 in [facility] Cardiac Rehab   Date  10/12/14   Educator  [place]   Instruction Review Code  2- meets goals/outcomes      Exercise & Equipment Safety: - Individual verbal instruction and demonstration of equipment use and safety with use of the equipment.      Cardiac Rehab from 10/17/2014 in [facility] Cardiac Rehab   Date  09/26/14   Educator  SB   Instruction Review Code  2- meets goals/outcomes      Infection Prevention: - Provides verbal and written material to individual with discussion of infection control including proper hand washing and proper equipment cleaning during exercise session.      Cardiac Rehab from 10/17/2014 in [facility] Cardiac Rehab   Date  09/26/14   Educator  SB   Instruction Review Code  2- meets goals/outcomes      Falls Prevention: - Provides verbal and written material to individual with discussion of falls prevention and safety.      Cardiac Rehab from 10/17/2014 in [facility] Cardiac Rehab   Date  09/26/14   Educator  SB   Instruction Review Code  2- meets goals/outcomes      Diabetes: - Individual verbal and written instruction to review  signs/symptoms of diabetes, desired ranges of glucose level fasting, after meals and with exercise. Advice that pre and post exercise glucose checks will be done for 3 sessions at entry of program.    Knowledge Questionnaire Score:     Knowledge Questionnaire Score - 09/26/14 1300    Knowledge Questionnaire Score   Pre Score 19/28      Personal Goals and Risk Factors at Admission:     Personal Goals and Risk Factors at Admission - 09/26/14 1202    Personal Goals and Risk Factors on Admission   Increase Aerobic Exercise and Physical Activity Yes;Sedentary   Intervention While in program, learn and follow the exercise prescription taught. Start at a low level workload and increase workload after able to maintain previous level for 30 minutes. Increase time before increasing intensity.   Intervention Provide exercise education and an individualized exercise prescription that will provide continued progressive overload as per policy without signs/symptoms of physical distress.   Quit Smoking Yes   Number of packs per day 3 to 4 cigarettes a day   Intervention Utilize your health care professional team to help with smoking cessation while in the program. Your doctor can prescribe medications to aid in cessation. The program can provide information and counseling as needed.   Take Less Medication Yes   Intervention Learn your  risk factors and begin the lifestyle modifications for risk factor control during your time in the program.   Diabetes No   Hypertension Yes   Goal Participant will see blood pressure controlled within the values of 140/36m/Hg or within value directed by their physician.   Intervention Provide nutrition & aerobic exercise along with prescribed medications to achieve BP 140/90 or less.   Lipids Yes   Goal Cholesterol controlled with medications as prescribed, with individualized exercise RX and with personalized nutrition plan. Value goals: LDL < 742m HDL > 4051m Participant states understanding of desired cholesterol values and following prescriptions.   Intervention Provide nutrition & aerobic exercise along with prescribed medications to achieve LDL <53m52mDL >40mg56m   Personal Goals and Risk Factors Review:      Goals and Risk Factor Review      10/19/14 1201           Increase Aerobic Exercise and Physical Activity   Goals Progress/Improvement seen  Yes       Comments Mr. ParkeAgcaoilirheumatoid arthritis which is a stressor and makes things harder for him incl mowing his yard. Was able to mow 1/2 of it without stopping which is better       Hypertension   Goal --  Mr. ParkeNeidigd pressure is well controlled.           Personal Goals Discharge (Final Personal Goals and Risk Factors Review):      Goals and Risk Factor Review - 10/19/14 1201    Increase Aerobic Exercise and Physical Activity   Goals Progress/Improvement seen  Yes   Comments Mr. ParkeDermodyrheumatoid arthritis which is a stressor and makes things harder for him incl mowing his yard. Was able to mow 1/2 of it without stopping which is better   Hypertension   Goal --  Mr. ParkeCrochetd pressure is well controlled.        Comments: Mr. ParkeGautierrheumatoid arthritis which is a stressor and makes things harder for him incl mowing his yard. Was able to mow 1/2 of it without stopping which is better

## 2014-10-19 NOTE — Addendum Note (Signed)
Addended by: Gerlene Burdock on: 10/19/2014 12:03 PM   Modules accepted: Orders

## 2014-10-19 NOTE — Progress Notes (Signed)
Daily Session Note  Patient Details  Name: AKONI PARTON MRN: [id number] Date of Birth: 03-Jan-1946 Referring Provider:  Corey Skains, MD  Encounter Date: 10/19/2014  Check In:     Session Check In - 10/19/14 0856    Check-In   Staff Present Nyoka Cowden RN;Renee [place] MS, ACSM CEP Exercise Physiologist;Colinda Barth BS, ACSM EP-C, Exercise Physiologist   ER physicians immediately available to respond to emergencies See telemetry face sheet for immediately available ER MD   Medication changes reported     No   Fall or balance concerns reported    No   Warm-up and Cool-down Performed on first and last piece of equipment   VAD Patient? No   Pain Assessment   Currently in Pain? No/denies         Goals Met:  Proper associated with RPD/PD & O2 Sat Exercise tolerated well No report of cardiac concerns or symptoms Strength training completed today  Goals Unmet:  Not Applicable  Goals Comments:    Dr. Emily Filbert is Medical Director for [place] [facility].

## 2014-10-21 DIAGNOSIS — N3941 Urge incontinence: Secondary | ICD-10-CM

## 2014-10-21 DIAGNOSIS — I2119 ST elevation (STEMI) myocardial infarction involving other coronary artery of inferior wall: Secondary | ICD-10-CM

## 2014-10-21 NOTE — Progress Notes (Signed)
Daily Session Note  Patient Details  Name: Martin Adkins MRN: [id number] Date of Birth: 1945/04/24 Referring Provider:  Madelyn Brunner, MD  Encounter Date: 10/21/2014  Check In:     Session Check In - 10/21/14 0931    Check-In   Staff Present Candiss Norse MS, ACSM CEP Exercise Physiologist;Other;Carroll Enterkin RN, BSN   ER physicians immediately available to respond to emergencies See telemetry face sheet for immediately available ER MD   Medication changes reported     No   Fall or balance concerns reported    No   Warm-up and Cool-down Performed on first and last piece of equipment   VAD Patient? No   Pain Assessment   Currently in Pain? No/denies         Goals Met:  Independence with exercise equipment Exercise tolerated well Personal goals reviewed No report of cardiac concerns or symptoms Strength training completed today  Goals Unmet:  Not Applicable  Goals Comments: Progressing well with his exercise prescription.    Dr. Emily Filbert is Medical Director for [place] and [facility].

## 2014-10-24 ENCOUNTER — Encounter: Payer: Medicare PPO | Admitting: *Deleted

## 2014-10-24 DIAGNOSIS — I2119 ST elevation (STEMI) myocardial infarction involving other coronary artery of inferior wall: Secondary | ICD-10-CM

## 2014-10-24 NOTE — Progress Notes (Signed)
Daily Session Note  Patient Details  Name: Martin Adkins MRN: [id number] Date of Birth: 09-06-1945 Referring Provider:  Corey Skains, MD  Encounter Date: 10/24/2014  Check In:     Session Check In - 10/24/14 0828    Check-In   Staff Present Candiss Norse MS, ACSM CEP Exercise Physiologist;Susanne Bice RN, BSN, CCRP;Emy Angevine Alfonso Patten, ACSM CEP Exercise Physiologist   ER physicians immediately available to respond to emergencies See telemetry face sheet for immediately available ER MD   Medication changes reported     No   Fall or balance concerns reported    No   Warm-up and Cool-down Performed on first and last piece of equipment   VAD Patient? No   Pain Assessment   Currently in Pain? No/denies   Multiple Pain Sites No         Goals Met:  Independence with exercise equipment Exercise tolerated well No report of cardiac concerns or symptoms Strength training completed today  Goals Unmet:  Not Applicable  Goals Comments:    Dr. Emily Filbert is Medical Director for [place] [facility].

## 2014-10-26 DIAGNOSIS — I2119 ST elevation (STEMI) myocardial infarction involving other coronary artery of inferior wall: Secondary | ICD-10-CM | POA: Diagnosis not present

## 2014-10-26 NOTE — Progress Notes (Signed)
Daily Session Note  Patient Details  Name: Martin Adkins MRN: [id number] Date of Birth: May 10, 1945 Referring Provider:  Corey Skains, MD  Encounter Date: 10/26/2014  Check In:     Session Check In - 10/26/14 0858    Check-In   Staff Present Lestine Box BS, ACSM EP-C, Exercise Physiologist;Renee [place] MS, ACSM CEP Exercise Physiologist;Mary Kellie Shropshire RN   ER physicians immediately available to respond to emergencies See telemetry face sheet for immediately available ER MD   Medication changes reported     No   Fall or balance concerns reported    No   Warm-up and Cool-down Performed on first and last piece of equipment   VAD Patient? No   Pain Assessment   Currently in Pain? No/denies         Goals Met:  Proper associated with RPD/PD & O2 Sat Exercise tolerated well No report of cardiac concerns or symptoms Strength training completed today  Goals Unmet:  Not Applicable  Goals Comments:    Dr. Emily Filbert is Medical Director for [place] [facility] and [facility].

## 2014-10-28 ENCOUNTER — Encounter: Payer: Medicare PPO | Admitting: *Deleted

## 2014-10-28 DIAGNOSIS — I2119 ST elevation (STEMI) myocardial infarction involving other coronary artery of inferior wall: Secondary | ICD-10-CM

## 2014-10-28 NOTE — Progress Notes (Signed)
Daily Session Note  Patient Details  Name: Martin Adkins MRN: [id number] Date of Birth: 1945/04/15 Referring Provider:  Corey Skains, MD  Encounter Date: 10/28/2014  Check In:      Goals Met:  Independence with exercise equipment Exercise tolerated well No report of cardiac concerns or symptoms Strength training completed today  Goals Unmet:  Not Applicable  Goals Comments: [name] is doing well with program. Exercise progression continues.   Dr. Emily Filbert is Medical Director for [place] and [facility].

## 2014-10-31 ENCOUNTER — Encounter: Payer: Medicare PPO | Admitting: *Deleted

## 2014-10-31 ENCOUNTER — Encounter: Payer: Self-pay | Admitting: Urology

## 2014-10-31 ENCOUNTER — Ambulatory Visit (INDEPENDENT_AMBULATORY_CARE_PROVIDER_SITE_OTHER): Payer: Medicare PPO | Admitting: Urology

## 2014-10-31 VITALS — BP 98/64 | HR 76 | Ht 72.0 in | Wt 212.8 lb

## 2014-10-31 DIAGNOSIS — E119 Type 2 diabetes mellitus without complications: Secondary | ICD-10-CM | POA: Insufficient documentation

## 2014-10-31 DIAGNOSIS — K317 Polyp of stomach and duodenum: Secondary | ICD-10-CM | POA: Insufficient documentation

## 2014-10-31 DIAGNOSIS — I2119 ST elevation (STEMI) myocardial infarction involving other coronary artery of inferior wall: Secondary | ICD-10-CM

## 2014-10-31 DIAGNOSIS — N3941 Urge incontinence: Secondary | ICD-10-CM

## 2014-10-31 NOTE — Progress Notes (Signed)
11:51 AM   Martin Adkins [birth year] [id number]  Referring provider: Madelyn Brunner, MD [facility] [place], [place] [address]  Chief Complaint  Patient presents with  . Urinary Incontinence    urge incontinence    HPI: Patient is a 69 year old African American male who was initiated on Myrbetriq 50 mg daily 6 weeks ago for his urge incontinence.  He presents today to discuss his response to the medication.    He states he noted no difference while taking the Myrbetriq 50 mg.  His IPSS score today was 8/5.  His PVR was 29 mL.  He is not experiencing any dysuria, gross hematuria or suprapubic pain.  I reviewed his liquid intake and diet and he is does not appear to be consuming foods or liquids that would irritate his bladder.    He has not found any relief with tamsulosin or finasteride that was prescribed by his PCP.  He did not find any relief with the oxybutynin or Myrbetriq.  He is having two to three urge incontinence episodes during the day and two to three at night.  He is not having a large incontinence volume, as it only leaves a small wet spot in his underwear.    He states he feels fine for the exception of the urge incontinence.        IPSS      10/05/14 1100 10/31/14 1100     International Prostate Symptom Score   How often have you had the sensation of not emptying your bladder? Not at All Not at All    How often have you had to urinate less than every two hours? Less than 1 in 5 times Less than 1 in 5 times    How often have you found you stopped and started again several times when you urinated? Not at All Not at All    How often have you found it difficult to postpone urination? Almost always More than half the time    How often have you had a weak urinary stream? Not at All Not at All    How often have you had to strain to start urination? Not at All Not at All    How many times did you typically get up at night to urinate?  3 Times 3 Times    Total IPSS Score 9 8    Quality of Life due to urinary symptoms   If you were to spend the rest of your life with your urinary condition just the way it is now how would you feel about that? Unhappy Unhappy       Score:  1-7 Mild 8-19 Moderate 20-35 Severe  PMH: Past Medical History  Diagnosis Date  . Hyperlipemia   . RA (rheumatoid arthritis) ([place])   . OSA (obstructive sleep apnea)   . PVD (peripheral vascular disease) ([place])   . GERD (gastroesophageal reflux disease)   . Osteoarthritis   . NSTEMI (non-ST elevated myocardial infarction) ([place])   . Hyperplastic polyps of stomach   . Barrett's esophagus   . Diabetes ([place])   . DVT (deep venous thrombosis) ([place])   . Abdominal aortic aneurysm without rupture ([place])   . CAD (coronary artery disease)   . SOB (shortness of breath)   . CHF (congestive heart failure) ([place])   . Hypertension   . Arthritis   . BPH (benign prostatic hyperplasia)   . AAA (abdominal aortic aneurysm)  without rupture ([place]) 04/05/2014  . Benign essential HTN 05/02/2014  . Arteriosclerosis of coronary artery 04/05/2014    Overview:  Sp cabg with lima to lad svg to d1 om1 rca 2004 with occluded svg to rca and d1 and pci stetn of om1 graft 2015   . Chronic systolic heart failure ([place]) 04/19/2014    Overview:  With segmental LV systolic dysfunction ejection fraction of 35%   . Antepartum deep phlebothrombosis ([place]) 07/28/2014  . Acute non-ST elevation myocardial infarction (NSTEMI) ([place]) 08/15/2014  . Myocardial infarction [facility]) 07/30/2014    Surgical History: Past Surgical History  Procedure Laterality Date  . Coronary angioplasty with stent placement  2015  . Coronary artery bypass graft    . Abdominal aorta stent    . Peripheral vascular catheterization N/A 09/06/2014    Procedure: IVC Filter Removal;  Surgeon: Katha Cabal, MD;  Location: [facility];  Service: Cardiovascular;  Laterality: N/A;    Home Medications:      Medication List       This list is accurate as of: 10/31/14 11:51 AM.  Always use your most recent med list.               amLODipine 5 MG tablet  Commonly known as:  NORVASC  10 mg daily.     aspirin EC 81 MG tablet  Take by mouth.     aspirin 81 MG tablet  Take 1 tablet by mouth daily.     atorvastatin 80 MG tablet  Commonly known as:  LIPITOR  TAKE 1 TABLET (80 MG TOTAL) BY MOUTH ONCE DAILY.     benazepril 20 MG tablet  Commonly known as:  LOTENSIN  Take 1 tablet by mouth daily.     chlorthalidone 25 MG tablet  Commonly known as:  HYGROTON  Take 1 tablet by mouth daily.     fluticasone 50 MCG/ACT nasal spray  Commonly known as:  FLONASE     HCA TRIPLE ANTIBIOTIC OINTMENT EX     Heat Therapy Patches Misc     hydroxychloroquine 200 MG tablet  Commonly known as:  PLAQUENIL     isosorbide mononitrate 30 MG 24 hr tablet  Commonly known as:  IMDUR     LUBRICANT DROPS OP  Place 1 drop into both eyes daily.     LUBRICANT EYE DROPS 0.4-0.3 % Soln  Generic drug:  Polyethyl Glycol-Propyl Glycol     metoprolol succinate 100 MG 24 hr tablet  Commonly known as:  TOPROL-XL     mirabegron ER 25 MG Tb24 tablet  Commonly known as:  MYRBETRIQ  Take 1 tablet (25 mg total) by mouth daily.     Naphazoline-Polyethyl Glycol 0.012-0.2 % Soln  Place 1 drop into both eyes daily.     pantoprazole 40 MG tablet  Commonly known as:  PROTONIX     potassium chloride SA 20 MEQ tablet  Commonly known as:  K-DUR,KLOR-CON     valsartan 160 MG tablet  Commonly known as:  DIOVAN  Take 160 mg by mouth daily.     warfarin 2.5 MG tablet  Commonly known as:  COUMADIN  2.5 mg. 5 mg one day alternate with 7 mg next day     warfarin 5 MG tablet  Commonly known as:  COUMADIN  TAKE 1 TABLET (5 MG TOTAL) BY MOUTH ONCE DAILY.        Allergies:  Allergies  Allergen Reactions  . Ace Inhibitors Other (See Comments)  Other reaction(s): Cough  . Niacin Other (See Comments)  .  Pravastatin Other (See Comments)    Other reaction(s): Muscle Pain  . Rosuvastatin Other (See Comments)    Other reaction(s): Other (See Comments) GI bleed  . Simvastatin Other (See Comments)    Other reaction(s): Muscle Pain  . Amoxicillin Rash  . Penicillins Rash    Family History: Family History  Problem Relation Age of Onset  . Bladder Cancer Neg Hx   . Prostate cancer Neg Hx   . Kidney cancer Neg Hx     Social History:  reports that he has been smoking Cigarettes.  He has a 5 pack-year smoking history. He does not have any smokeless tobacco history on file. He reports that he drinks alcohol. He reports that he does not use illicit drugs.  ROS: UROLOGY Frequent Urination?: No Hard to postpone urination?: Yes Burning/pain with urination?: No Get up at night to urinate?: No Leakage of urine?: No Urine stream starts and stops?: No Trouble starting stream?: No Do you have to strain to urinate?: No Blood in urine?: No Urinary tract infection?: No Sexually transmitted disease?: No Injury to kidneys or bladder?: No Painful intercourse?: No Weak stream?: No Erection problems?: No Penile pain?: No  Gastrointestinal Nausea?: No Vomiting?: No Indigestion/heartburn?: No Diarrhea?: No Constipation?: No  Constitutional Fever: No Night sweats?: No Weight loss?: No Fatigue?: No  Skin Skin rash/lesions?: No Itching?: No  Eyes Blurred vision?: No Double vision?: No  Ears/Nose/Throat Sore throat?: No Sinus problems?: No  Hematologic/Lymphatic Swollen glands?: No Easy bruising?: No  Cardiovascular Leg swelling?: No Chest pain?: No  Respiratory Cough?: No  Endocrine Excessive thirst?: No  Musculoskeletal Back pain?: No Joint pain?: No  Neurological Headaches?: No Dizziness?: No  Psychologic Depression?: No Anxiety?: No  Physical Exam: Blood pressure 98/64, pulse 76, height 6' (1.829 m), weight 212 lb 12.8 oz (96.525 kg).   Laboratory  Data: Lab Results  Component Value Date   WBC 10.0 07/30/2014   HGB 15.4 07/30/2014   HCT 47.4 07/30/2014   MCV 79.6* 07/30/2014   PLT 189 07/30/2014    Lab Results  Component Value Date   CREATININE 1.06 07/30/2014   PSA History:  0.43 ng/mL on 12/01/2013    Pertinent Imaging: Results for JACEON, HEIBERGER (MRN [id number]) as of 10/05/2014 11:39  Ref. Range 10/05/2014 11:23  Scan Result Unknown 45    Assessment & Plan:    1. Urge incontinence of urine:  Patient did not find any relief from the urge incontinence with tamsulosin/finasteride, oxybutynin or Myrbetriq.   I will schedule him for a cystoscopic examination to rule out any outlet obstruction.  If there are no significant findings on cystoscopy, he may benefit from UDS.  -BLADDER SCAN AMB NON-IMAGING  2. Other male erectile dysfunction History of refractory erectile dysfunction, not currently a candidate for PDE 5 inhibitors. May discuss intracavernosal injections in the future.  3. Peyronie disease Stable curvature, minimal bother.  Return for cystoscopy.  Zara Council, [facility] [address], [place], [place] [address] [phone]

## 2014-10-31 NOTE — Progress Notes (Signed)
Bladder Scan Patient void: 29 ml Performed By: Larna Daughters

## 2014-10-31 NOTE — Progress Notes (Signed)
Daily Session Note  Patient Details  Name: Martin Adkins MRN: [id number] Date of Birth: 1945/10/19 Referring Provider:  Corey Skains, MD  Encounter Date: 10/31/2014  Check In:     Session Check In - 10/31/14 0827    Check-In   Staff Present Candiss Norse MS, ACSM CEP Exercise Physiologist;Susanne Bice RN, BSN, CCRP;Pleasant Bensinger Alfonso Patten, ACSM CEP Exercise Physiologist   ER physicians immediately available to respond to emergencies See telemetry face sheet for immediately available ER MD   Medication changes reported     No   Fall or balance concerns reported    No   Warm-up and Cool-down Performed on first and last piece of equipment   VAD Patient? No   Pain Assessment   Currently in Pain? No/denies   Multiple Pain Sites No         Goals Met:  Independence with exercise equipment Exercise tolerated well No report of cardiac concerns or symptoms Strength training completed today  Goals Unmet:  Not Applicable  Goals Comments: Patient completed exercise prescription and all exercise goals during rehab session. The exercise was tolerated well and the patient is progressing in the program.     Dr. Emily Filbert is Medical Director for [facility] and [facility].

## 2014-11-02 ENCOUNTER — Encounter: Payer: Self-pay | Admitting: *Deleted

## 2014-11-02 DIAGNOSIS — I2119 ST elevation (STEMI) myocardial infarction involving other coronary artery of inferior wall: Secondary | ICD-10-CM | POA: Diagnosis not present

## 2014-11-02 NOTE — Progress Notes (Signed)
Daily Session Note  Patient Details  Name: Martin Adkins MRN: [id number] Date of Birth: 25-Mar-1945 Referring Provider:  Corey Skains, MD  Encounter Date: 11/02/2014  Check In:     Session Check In - 11/02/14 0830    Check-In   Staff Present Candiss Norse MS, ACSM CEP Exercise Physiologist;Susanne Bice RN, BSN, CCRP;Steven Way BS, ACSM EP-C, Exercise Physiologist   ER physicians immediately available to respond to emergencies See telemetry face sheet for immediately available ER MD   Medication changes reported     No   Fall or balance concerns reported    No   Warm-up and Cool-down Performed on first and last piece of equipment   VAD Patient? No   Pain Assessment   Currently in Pain? No/denies   Multiple Pain Sites No           Exercise Prescription Changes - 11/02/14 0800    Exercise Review   Progression Yes   Response to Exercise   Symptoms None   Comments Talked with [name] about his current workloads and made increases in level on the bike and added an incline on the treadmill. [name] is encouaged by the progress he is making and appreciates the exercise challenges.    Duration Progress to 50 minutes of aerobic without signs/symptoms of physical distress   Intensity Rest + 30   Progression Continue progressive overload as per policy without signs/symptoms or physical distress.   Resistance Training   Training Prescription Yes   Weight 3   Reps 10-15   Interval Training   Interval Training Yes   Equipment Recumbant Bike   Comments HIIT was explained to the patient who wanted to begin with it on the [place].  Stated full understanding and demonstrated well.   Treadmill   MPH 3   Grade 1   Minutes 20   Recumbant Bike   Level 12   RPM 45   Watts 80   Minutes 20   Recumbant Elliptical   Level 3  BioStep   Watts 30   Minutes 20   REL-XR   Level 6   Watts 65   Minutes 15      Goals Met:  Independence with exercise equipment Exercise tolerated  well Personal goals reviewed No report of cardiac concerns or symptoms Strength training completed today  Goals Unmet:  Not Applicable  Goals Comments: Patient completed exercise prescription and all exercise goals during rehab session. The exercise was tolerated well and the patient is progressing in the program.    Dr. Emily Filbert is Medical Director for [place] and [facility].

## 2014-11-03 NOTE — Progress Notes (Signed)
Cardiac Individual Treatment Plan  Patient Details  Name: Martin Adkins MRN: [id number] Date of Birth: 07/16/1945 Referring Provider:  Corey Skains, MD  Initial Encounter Date:    Visit Diagnosis: ST elevation myocardial infarction (STEMI) involving other coronary artery of inferior wall ([place])  Patient's Home Medications on Admission:  Current outpatient prescriptions:  .  amLODipine (NORVASC) 5 MG tablet, 10 mg daily. , Disp: , Rfl:  .  aspirin 81 MG tablet, Take 1 tablet by mouth daily., Disp: , Rfl:  .  aspirin EC 81 MG tablet, Take by mouth., Disp: , Rfl:  .  atorvastatin (LIPITOR) 80 MG tablet, TAKE 1 TABLET (80 MG TOTAL) BY MOUTH ONCE DAILY., Disp: , Rfl: 11 .  benazepril (LOTENSIN) 20 MG tablet, Take 1 tablet by mouth daily., Disp: , Rfl:  .  chlorthalidone (HYGROTON) 25 MG tablet, Take 1 tablet by mouth daily., Disp: , Rfl:  .  fluticasone (FLONASE) 50 MCG/ACT nasal spray, , Disp: , Rfl:  .  Heat Wraps (HEAT THERAPY PATCHES) MISC, , Disp: , Rfl:  .  hydroxychloroquine (PLAQUENIL) 200 MG tablet, , Disp: , Rfl:  .  isosorbide mononitrate (IMDUR) 30 MG 24 hr tablet, , Disp: , Rfl:  .  LUBRICANT EYE DROPS 0.4-0.3 % SOLN, , Disp: , Rfl:  .  metoprolol succinate (TOPROL-XL) 100 MG 24 hr tablet, , Disp: , Rfl:  .  mirabegron ER (MYRBETRIQ) 25 MG TB24 tablet, Take 1 tablet (25 mg total) by mouth daily., Disp: 30 tablet, Rfl: 0 .  Naphazoline-Polyethyl Glycol 0.012-0.2 % SOLN, Place 1 drop into both eyes daily., Disp: , Rfl:  .  Neomycin-Bacitracin-Polymyxin (HCA TRIPLE ANTIBIOTIC OINTMENT EX), , Disp: , Rfl:  .  pantoprazole (PROTONIX) 40 MG tablet, , Disp: , Rfl:  .  Polyvinyl Alcohol (LUBRICANT DROPS OP), Place 1 drop into both eyes daily., Disp: , Rfl:  .  potassium chloride SA (K-DUR,KLOR-CON) 20 MEQ tablet, , Disp: , Rfl:  .  valsartan (DIOVAN) 160 MG tablet, Take 160 mg by mouth daily., Disp: , Rfl:  .  warfarin (COUMADIN) 2.5 MG tablet, 2.5 mg. 5 mg one day alternate  with 7 mg next day, Disp: , Rfl:  .  warfarin (COUMADIN) 5 MG tablet, TAKE 1 TABLET (5 MG TOTAL) BY MOUTH ONCE DAILY., Disp: , Rfl: 5  Past Medical History: Past Medical History  Diagnosis Date  . Hyperlipemia   . RA (rheumatoid arthritis) ([place])   . OSA (obstructive sleep apnea)   . PVD (peripheral vascular disease) ([place])   . GERD (gastroesophageal reflux disease)   . Osteoarthritis   . NSTEMI (non-ST elevated myocardial infarction) ([place])   . Hyperplastic polyps of stomach   . Barrett's esophagus   . Diabetes ([place])   . DVT (deep venous thrombosis) ([place])   . Abdominal aortic aneurysm without rupture ([place])   . CAD (coronary artery disease)   . SOB (shortness of breath)   . CHF (congestive heart failure) ([place])   . Hypertension   . Arthritis   . BPH (benign prostatic hyperplasia)   . AAA (abdominal aortic aneurysm) without rupture ([place]) 04/05/2014  . Benign essential HTN 05/02/2014  . Arteriosclerosis of coronary artery 04/05/2014    Overview:  Sp cabg with lima to lad svg to d1 om1 rca 2004 with occluded svg to rca and d1 and pci stetn of om1 graft 2015   . Chronic systolic heart failure ([place]) 04/19/2014    Overview:  With segmental LV systolic dysfunction ejection  fraction of 35%   . Antepartum deep phlebothrombosis ([place]) 07/28/2014  . Acute non-ST elevation myocardial infarction (NSTEMI) ([place]) 08/15/2014  . Myocardial infarction ([place]) 07/30/2014    Tobacco Use: History  Smoking status  . Current Some Day Smoker -- 0.25 packs/day for 20 years  . Types: Cigarettes  Smokeless tobacco  . Not on file    Labs: Recent Review Flowsheet Data    Labs for ITP Cardiac and Pulmonary Rehab Latest Ref Rng 05/03/2013   Cholestrol 0-200 mg/dL 196   LDLCALC 0-100 mg/dL 121(H)   HDL 40-60 mg/dL 42   Trlycerides 0-200 mg/dL 164       Exercise Target Goals:    Exercise Program Goal: Individual exercise prescription set with THRR, safety & activity barriers. Participant demonstrates  ability to understand and report RPE using BORG scale, to self-measure pulse accurately, and to acknowledge the importance of the exercise prescription.  Exercise Prescription Goal: Starting with aerobic activity 30 plus minutes a day, 3 days per week for initial exercise prescription. Provide home exercise prescription and guidelines that participant acknowledges understanding prior to discharge.  Activity Barriers & Risk Stratification:     Activity Barriers & Risk Stratification - 09/26/14 1158    Activity Barriers & Risk Stratification   Activity Barriers Arthritis   Risk Stratification High      6 Minute Walk:     6 Minute Walk      09/26/14 1259       6 Minute Walk   Phase Initial     Distance 1330 feet     Walk Time 6 minutes     Resting HR 74 bpm     Resting BP 112/78 mmHg     Max Ex. HR 89 bpm     Max Ex. BP 130/70 mmHg     RPE 11     Symptoms No        Initial Exercise Prescription:     Initial Exercise Prescription - 09/26/14 1300    Date of Initial Exercise Prescription   Date 09/26/14   Treadmill   MPH 2.5   Grade 0   Minutes 15   Bike   Level 1   Minutes 15   Recumbant Bike   Level 2   Watts 30   Minutes 15   NuStep   Level 3   Watts 40   Minutes 15   Arm Ergometer   Level 1   Watts 10   Minutes 15   Arm/Foot Ergometer   Level 1   Watts 15   Minutes 15   Cybex   Level 2   RPM 20   Minutes 15   Recumbant Elliptical   Level 2   RPM 40   Watts 20   Minutes 15   Elliptical   Level 1   Speed 2.5   Minutes 5   REL-XR   Level 3   Watts 40   Minutes 15   Prescription Details   Frequency (times per week) 3   Duration Progress to 30 minutes of continuous aerobic without signs/symptoms of physical distress   Intensity   THRR REST +  30   Ratings of Perceived Exertion 11-13   Progression Continue progressive overload as per policy without signs/symptoms or physical distress.   Resistance Training   Training Prescription Yes    Weight 2   Reps 10-12      Exercise Prescription Changes:     Exercise Prescription Changes  10/11/14 0700 10/17/14 0800 10/26/14 0800 11/02/14 0800     Exercise Review   Progression Yes Yes Yes Yes    Response to Exercise   Blood Pressure (Admit) 128/70 mmHg 128/70 mmHg      Blood Pressure (Exercise) 156/82 mmHg 156/82 mmHg      Blood Pressure (Exit) 126/72 mmHg 126/72 mmHg      Heart Rate (Admit) 74 bpm 74 bpm      Heart Rate (Exercise) 91 bpm 91 bpm      Heart Rate (Exit) 72 bpm 72 bpm      Rating of Perceived Exertion (Exercise) 11 11      Symptoms None None  None    Comments Reviewed individualized exercise prescription and made increases per departmental policy. Exercise increases were discussed with the patient and they were able to perform the new work loads without issue (no signs or symptoms).  Reviewed individualized exercise prescription and made increases per departmental policy. Exercise increases were discussed with the patient and they were able to perform the new work loads without issue (no signs or symptoms). [name] is doing well in class and is enjoying the program.   Talked with [name] about his current workloads and made increases in level on the bike and added an incline on the treadmill. [name] is encouaged by the progress he is making and appreciates the exercise challenges.     Duration Progress to 50 minutes of aerobic without signs/symptoms of physical distress Progress to 50 minutes of aerobic without signs/symptoms of physical distress  Progress to 50 minutes of aerobic without signs/symptoms of physical distress    Intensity Rest + 30 Rest + 30  Rest + 30    Progression Continue progressive overload as per policy without signs/symptoms or physical distress. Continue progressive overload as per policy without signs/symptoms or physical distress.  Continue progressive overload as per policy without signs/symptoms or physical distress.    Resistance Training    Training Prescription Yes Yes  Yes    Weight '3 3  3    ' Reps 10-15 10-15  10-15    Interval Training   Interval Training No No Yes Yes    Equipment   Recumbant Bike Recumbant Bike    Comments   HIIT was explained to the patient who wanted to begin with it on the [place].  Stated full understanding and demonstrated well. HIIT was explained to the patient who wanted to begin with it on the [place].  Stated full understanding and demonstrated well.    Treadmill   MPH 2.7 2.7  3    Grade 0 0  1    Minutes '20 20  20    ' Recumbant Bike   Level    12    RPM    45    Watts    80    Minutes    20    Recumbant Elliptical   Level 3  BioStep 3  BioStep  3  BioStep    Watts '30 30  30    ' Minutes '20 20  20    ' REL-XR   Level '3 6  6    ' Watts 60 65  65    Minutes '15 15  15       ' Discharge Exercise Prescription (Final Exercise Prescription Changes):     Exercise Prescription Changes - 11/02/14 0800    Exercise Review   Progression Yes   Response to Exercise   Symptoms None   Comments  Talked with [name] about his current workloads and made increases in level on the bike and added an incline on the treadmill. [name] is encouaged by the progress he is making and appreciates the exercise challenges.    Duration Progress to 50 minutes of aerobic without signs/symptoms of physical distress   Intensity Rest + 30   Progression Continue progressive overload as per policy without signs/symptoms or physical distress.   Resistance Training   Training Prescription Yes   Weight 3   Reps 10-15   Interval Training   Interval Training Yes   Equipment Recumbant Bike   Comments HIIT was explained to the patient who wanted to begin with it on the [place].  Stated full understanding and demonstrated well.   Treadmill   MPH 3   Grade 1   Minutes 20   Recumbant Bike   Level 12   RPM 45   Watts 80   Minutes 20   Recumbant Elliptical   Level 3  BioStep   Watts 30   Minutes 20   REL-XR   Level 6   Watts 65   Minutes  15      Nutrition:  Target Goals: Understanding of nutrition guidelines, daily intake of sodium <1553m, cholesterol <2021m calories 30% from fat and 7% or less from saturated fats, daily to have 5 or more servings of fruits and vegetables.  Biometrics:     Pre Biometrics - 09/26/14 1307    Pre Biometrics   Height 6' (1.829 m)   Weight 211 lb 9.6 oz (95.981 kg)   Waist Circumference 42.75 inches   Hip Circumference 41 inches   Waist to Hip Ratio 1.04 %   BMI (Calculated) 28.8       Nutrition Therapy Plan and Nutrition Goals:   Nutrition Discharge: Rate Your Plate Scores:   Nutrition Goals Re-Evaluation:     Nutrition Goals Re-Evaluation      10/12/14 1852           Personal Goal #1 Re-Evaluation   Personal Goal #1 I asked [email or web address] and he said he prefers not to meet individuallly with the Cardiac Rehab Registered Dietician since he feels he eats healthy now.        Goal Progress Seen Yes          Psychosocial: Target Goals: Acknowledge presence or absence of depression, maximize coping skills, provide positive support system. Participant is able to verbalize types and ability to use techniques and skills needed for reducing stress and depression.  Initial Review & Psychosocial Screening:     Initial Psych Review & Screening - 09/26/14 1201    Family Dynamics   Good Support System? Yes   Comments Lives alone  separated for over 4 years      Quality of Life Scores:     Quality of Life - 09/26/14 1300    Quality of Life Scores   Health/Function Pre 22.4 %   Socioeconomic Pre 28.93 %   Psych/Spiritual Pre 25.71 %   Family Pre 24 %   GLOBAL Pre 24.66 %      PHQ-9:     Recent Review Flowsheet Data    [facility]   Decreased Interest 1   Down, Depressed, Hopeless 0   PHQ - 2 Score 1   Altered sleeping 1   Tired, decreased energy 1   Change in appetite 0   Feeling bad or failure about yourself  0   Trouble  concentrating 0   Moving slowly or fidgety/restless 0   Suicidal thoughts 0   PHQ-9 Score 3   Difficult doing work/chores Somewhat difficult      Psychosocial Evaluation and Intervention:     Psychosocial Evaluation - 10/03/14 1012    Psychosocial Evaluation & Interventions   Interventions Stress management education;Relaxation education;Encouraged to exercise with the program and follow exercise prescription   Comments Counselor met today with Mr. Carlile for initial psychosocial evaluation.  He is a 69 year old who had a heart attack in July and is in this program for recovery.  Mr. Eden has a strong support system with an adult daughter and a brother who live close by.  He reports he sleeps well and has a good appetite.  He denies a history of depression or anxiety or any current symptoms.  He has minimal stress in his life and states he is typically in a positive mood.  Mr. Fanfan goals are to increase his stamina and strength and "just to feel better" in this program.  He plans work out through a [employer] program following this program.     Continued Psychosocial Services Needed Yes  Mr. Mabee will benefit from the psychoeducational components of this program and consistent exercise.        Psychosocial Re-Evaluation:     Psychosocial Re-Evaluation      10/19/14 1200           Psychosocial Re-Evaluation   Interventions Stress management education;Encouraged to attend Cardiac Rehabilitation for the exercise       Comments Mr. Galicia has rheumatoid arthritis which is a stressor and makes things harder for him incl mowing his yard. Was able to mow 1/2 of it without stopping which is better.           Vocational Rehabilitation: Provide vocational rehab assistance to qualifying candidates.   Vocational Rehab Evaluation & Intervention:     Vocational Rehab - 09/26/14 1158    Initial Vocational Rehab Evaluation & Intervention   Assessment shows need for Vocational  Rehabilitation No      Education: Education Goals: Education classes will be provided on a weekly basis, covering required topics. Participant will state understanding/return demonstration of topics presented.  Learning Barriers/Preferences:     Learning Barriers/Preferences - 09/26/14 1158    Learning Barriers/Preferences   Learning Barriers None   Learning Preferences None      Education Topics: General Nutrition Guidelines/Fats and Fiber: -Group instruction provided by verbal, written material, models and posters to present the general guidelines for heart healthy nutrition. Gives an explanation and review of dietary fats and fiber.          Cardiac Rehab from 11/02/2014 in [facility] Rehab   Date  10/17/14   Educator  PI   Instruction Review Code  2- meets goals/outcomes      Controlling Sodium/Reading Food Labels: -Group verbal and written material supporting the discussion of sodium use in heart healthy nutrition. Review and explanation with models, verbal and written materials for utilization of the food label.      Cardiac Rehab from 11/02/2014 in [facility] Cardiac Rehab   Date  10/31/14   Educator  CR   Instruction Review Code  2- meets goals/outcomes      Exercise Physiology & Risk Factors: - Group verbal and written instruction with models to review the exercise physiology of the cardiovascular system and associated critical values. Details cardiovascular disease risk factors and the goals associated with  each risk factor.   Aerobic Exercise & Resistance Training: - Gives group verbal and written discussion on the health impact of inactivity. On the components of aerobic and resistive training programs and the benefits of this training and how to safely progress through these programs.   Flexibility, Balance, General Exercise Guidelines: - Provides group verbal and written instruction on the benefits of flexibility and balance training programs. Provides general  exercise guidelines with specific guidelines to those with heart or lung disease. Demonstration and skill practice provided.   Stress Management: - Provides group verbal and written instruction about the health risks of elevated stress, cause of high stress, and healthy ways to reduce stress.   Depression: - Provides group verbal and written instruction on the correlation between heart/lung disease and depressed mood, treatment options, and the stigmas associated with seeking treatment.      Cardiac Rehab from 11/02/2014 in [facility] Cardiac Rehab   Date  11/02/14   Educator  [facility]   Instruction Review Code  2- meets goals/outcomes      Anatomy & Physiology of the Heart: - Group verbal and written instruction and models provide basic cardiac anatomy and physiology, with the coronary electrical and arterial systems. Review of: AMI, Angina, Valve disease, Heart Failure, Cardiac Arrhythmia, Pacemakers, and the ICD.      Cardiac Rehab from 11/02/2014 in [facility] Cardiac Rehab   Date  10/03/14   Educator  SB   Instruction Review Code  2- meets goals/outcomes      Cardiac Procedures: - Group verbal and written instruction and models to describe the testing methods done to diagnose heart disease. Reviews the outcomes of the test results. Describes the treatment choices: Medical Management, Angioplasty, or Coronary Bypass Surgery.      Cardiac Rehab from 11/02/2014 in [facility] Cardiac Rehab   Date  10/24/14   Educator  SB   Instruction Review Code  2- meets goals/outcomes      Cardiac Medications: - Group verbal and written instruction to review commonly prescribed medications for heart disease. Reviews the medication, class of the drug, and side effects. Includes the steps to properly store meds and maintain the prescription regimen.   Go Sex-Intimacy & Heart Disease, Get SMART - Goal Setting: - Group verbal and written instruction through game format to discuss heart disease and the return to sexual  intimacy. Provides group verbal and written material to discuss and apply goal setting through the application of the S.M.A.R.T. Method.      Cardiac Rehab from 11/02/2014 in [facility] Cardiac Rehab   Date  10/24/14   Educator  SB   Instruction Review Code  2- meets goals/outcomes      Other Matters of the Heart: - Provides group verbal, written materials and models to describe Heart Failure, Angina, Valve Disease, and Diabetes in the realm of heart disease. Includes description of the disease process and treatment options available to the cardiac patient.      Cardiac Rehab from 11/02/2014 in [facility] Cardiac Rehab   Date  10/12/14   Educator  [place]   Instruction Review Code  2- meets goals/outcomes      Exercise & Equipment Safety: - Individual verbal instruction and demonstration of equipment use and safety with use of the equipment.      Cardiac Rehab from 11/02/2014 in [facility] Cardiac Rehab   Date  09/26/14   Educator  SB   Instruction Review Code  2- meets goals/outcomes      Infection Prevention: -  Provides verbal and written material to individual with discussion of infection control including proper hand washing and proper equipment cleaning during exercise session.      Cardiac Rehab from 11/02/2014 in [facility] Cardiac Rehab   Date  09/26/14   Educator  SB   Instruction Review Code  2- meets goals/outcomes      Falls Prevention: - Provides verbal and written material to individual with discussion of falls prevention and safety.      Cardiac Rehab from 11/02/2014 in [facility] Cardiac Rehab   Date  09/26/14   Educator  SB   Instruction Review Code  2- meets goals/outcomes      Diabetes: - Individual verbal and written instruction to review signs/symptoms of diabetes, desired ranges of glucose level fasting, after meals and with exercise. Advice that pre and post exercise glucose checks will be done for 3 sessions at entry of program.    Knowledge Questionnaire Score:      Knowledge Questionnaire Score - 09/26/14 1300    Knowledge Questionnaire Score   Pre Score 19/28      Personal Goals and Risk Factors at Admission:     Personal Goals and Risk Factors at Admission - 09/26/14 1202    Personal Goals and Risk Factors on Admission   Increase Aerobic Exercise and Physical Activity Yes;Sedentary   Intervention While in program, learn and follow the exercise prescription taught. Start at a low level workload and increase workload after able to maintain previous level for 30 minutes. Increase time before increasing intensity.   Intervention Provide exercise education and an individualized exercise prescription that will provide continued progressive overload as per policy without signs/symptoms of physical distress.   Quit Smoking Yes   Number of packs per day 3 to 4 cigarettes a day   Intervention Utilize your health care professional team to help with smoking cessation while in the program. Your doctor can prescribe medications to aid in cessation. The program can provide information and counseling as needed.   Take Less Medication Yes   Intervention Learn your risk factors and begin the lifestyle modifications for risk factor control during your time in the program.   Diabetes No   Hypertension Yes   Goal Participant will see blood pressure controlled within the values of 140/48m/Hg or within value directed by their physician.   Intervention Provide nutrition & aerobic exercise along with prescribed medications to achieve BP 140/90 or less.   Lipids Yes   Goal Cholesterol controlled with medications as prescribed, with individualized exercise RX and with personalized nutrition plan. Value goals: LDL < 782m HDL > 4024mParticipant states understanding of desired cholesterol values and following prescriptions.   Intervention Provide nutrition & aerobic exercise along with prescribed medications to achieve LDL <77m72mDL >40mg79m   Personal Goals and Risk  Factors Review:      Goals and Risk Factor Review      10/19/14 1201 10/21/14 0937 11/02/14 0929 11/02/14 1200     Increase Aerobic Exercise and Physical Activity   Goals Progress/Improvement seen  Yes Yes Yes     Comments Mr. ParkeMatsumotorheumatoid arthritis which is a stressor and makes things harder for him incl mowing his yard. Was able to mow 1/2 of it without stopping which is better [name] iWille Glasermproving on all of the exercise equipment and is up to 2.7mph 4mthe treadmill. He is very compliant with exercise increases and he is encouraged by all the progress he has  made so far on the equipment.  Making significant gains on equipment, however, still experiencing some leg pain due to medications.     Quit Smoking   Goals Progress/Improvement seen    Yes;No    Comments    [name] continues to smoke 1-3 cigarettes a day. He is not ready to quit yet. We did review options in the community when he is ready to quit. Introduced Lobbyist to him.    Hypertension   Goal --  Mr. Mcnellis blood pressure is well controlled.   Participant will see blood pressure controlled within the values of 140/53m/Hg or within value directed by their physician. Participant will see blood pressure controlled within the values of 140/989mHg or within value directed by their physician.    Progress seen toward goals    Yes    Comments    [name] is aware of his BP readings and is maintianing good numbers. He is watching sodium intake and eating as healthy as he can.    Abnormal Lipids   Goal   Cholesterol controlled with medications as prescribed, with individualized exercise RX and with personalized nutrition plan. Value goals: LDL < 7052mHDL > 37m75marticipant states understanding of desired cholesterol values and following prescriptions.  medication side effects, still monitoring blood panel for changies Cholesterol controlled with medications as prescribed, with individualized exercise RX and with personalized  nutrition plan. Value goals: LDL < 70mg60mL > 37mg.43mticipant states understanding of desired cholesterol values and following prescriptions.    Progress seen towards goals    Unknown    Comments    [name] haWille Glaserot had a labs recently. He does not like taking the medication for cholesterol control as it causes muscle aches off and on. His doctor is aware of the symptoms and has made several changes in the medication . [name] continues to have the muscle soreness and is living with it. The soreness is not consistent.       Personal Goals Discharge (Final Personal Goals and Risk Factors Review):      Goals and Risk Factor Review - 11/02/14 1200    Quit Smoking   Goals Progress/Improvement seen Yes;No   Comments [name] continues to smoke 1-3 cigarettes a day. He is not ready to quit yet. We did review options in the community when he is ready to quit. Introduced [employer]uit[employer].   Hypertension   Goal Participant will see blood pressure controlled within the values of 140/90mm/H37m within value directed by their physician.   Progress seen toward goals Yes   Comments [name] is aware of his BP readings and is maintianing good numbers. He is watching sodium intake and eating as healthy as he can.   Abnormal Lipids   Goal Cholesterol controlled with medications as prescribed, with individualized exercise RX and with personalized nutrition plan. Value goals: LDL < 70mg, H16m 37mg. Pa86mipant states understanding of desired cholesterol values and following prescriptions.   Progress seen towards goals Unknown   Comments [name] has nWille Glaserhad a labs recently. He does not like taking the medication for cholesterol control as it causes muscle aches off and on. His doctor is aware of the symptoms and has made several changes in the medication . [name] continues to have the muscle soreness and is living with it. The soreness is not consistent.       Comments: "[name]" Bryann Seaton Hofmannkly attendence reports to  send to his insurance company.

## 2014-11-03 NOTE — Progress Notes (Signed)
Cardiac Individual Treatment Plan  Patient Details  Name: Martin Adkins MRN: [id number] Date of Birth: 12-09-1945 Referring Provider:  Corey Skains, MD  Initial Encounter Date:   09/26/2014 Visit Diagnosis: ST elevation myocardial infarction (STEMI) involving other coronary artery of inferior wall ([place])  Patient's Home Medications on Admission:  Current outpatient prescriptions:  .  amLODipine (NORVASC) 5 MG tablet, 10 mg daily. , Disp: , Rfl:  .  aspirin 81 MG tablet, Take 1 tablet by mouth daily., Disp: , Rfl:  .  aspirin EC 81 MG tablet, Take by mouth., Disp: , Rfl:  .  atorvastatin (LIPITOR) 80 MG tablet, TAKE 1 TABLET (80 MG TOTAL) BY MOUTH ONCE DAILY., Disp: , Rfl: 11 .  benazepril (LOTENSIN) 20 MG tablet, Take 1 tablet by mouth daily., Disp: , Rfl:  .  chlorthalidone (HYGROTON) 25 MG tablet, Take 1 tablet by mouth daily., Disp: , Rfl:  .  fluticasone (FLONASE) 50 MCG/ACT nasal spray, , Disp: , Rfl:  .  Heat Wraps (HEAT THERAPY PATCHES) MISC, , Disp: , Rfl:  .  hydroxychloroquine (PLAQUENIL) 200 MG tablet, , Disp: , Rfl:  .  isosorbide mononitrate (IMDUR) 30 MG 24 hr tablet, , Disp: , Rfl:  .  LUBRICANT EYE DROPS 0.4-0.3 % SOLN, , Disp: , Rfl:  .  metoprolol succinate (TOPROL-XL) 100 MG 24 hr tablet, , Disp: , Rfl:  .  mirabegron ER (MYRBETRIQ) 25 MG TB24 tablet, Take 1 tablet (25 mg total) by mouth daily., Disp: 30 tablet, Rfl: 0 .  Naphazoline-Polyethyl Glycol 0.012-0.2 % SOLN, Place 1 drop into both eyes daily., Disp: , Rfl:  .  Neomycin-Bacitracin-Polymyxin (HCA TRIPLE ANTIBIOTIC OINTMENT EX), , Disp: , Rfl:  .  pantoprazole (PROTONIX) 40 MG tablet, , Disp: , Rfl:  .  Polyvinyl Alcohol (LUBRICANT DROPS OP), Place 1 drop into both eyes daily., Disp: , Rfl:  .  potassium chloride SA (K-DUR,KLOR-CON) 20 MEQ tablet, , Disp: , Rfl:  .  valsartan (DIOVAN) 160 MG tablet, Take 160 mg by mouth daily., Disp: , Rfl:  .  warfarin (COUMADIN) 2.5 MG tablet, 2.5 mg. 5 mg one day  alternate with 7 mg next day, Disp: , Rfl:  .  warfarin (COUMADIN) 5 MG tablet, TAKE 1 TABLET (5 MG TOTAL) BY MOUTH ONCE DAILY., Disp: , Rfl: 5  Past Medical History: Past Medical History  Diagnosis Date  . Hyperlipemia   . RA (rheumatoid arthritis) ([place])   . OSA (obstructive sleep apnea)   . PVD (peripheral vascular disease) ([place])   . GERD (gastroesophageal reflux disease)   . Osteoarthritis   . NSTEMI (non-ST elevated myocardial infarction) ([place])   . Hyperplastic polyps of stomach   . Barrett's esophagus   . Diabetes ([place])   . DVT (deep venous thrombosis) ([place])   . Abdominal aortic aneurysm without rupture ([place])   . CAD (coronary artery disease)   . SOB (shortness of breath)   . CHF (congestive heart failure) ([place])   . Hypertension   . Arthritis   . BPH (benign prostatic hyperplasia)   . AAA (abdominal aortic aneurysm) without rupture ([employer]) 04/05/2014  . Benign essential HTN 05/02/2014  . Arteriosclerosis of coronary artery 04/05/2014    Overview:  Sp cabg with lima to lad svg to d1 om1 rca 2004 with occluded svg to rca and d1 and pci stetn of om1 graft 2015   . Chronic systolic heart failure ([place]) 04/19/2014    Overview:  With segmental LV systolic dysfunction ejection  fraction of 35%   . Antepartum deep phlebothrombosis ([place]) 07/28/2014  . Acute non-ST elevation myocardial infarction (NSTEMI) ([place]) 08/15/2014  . Myocardial infarction ([place]) 07/30/2014    Tobacco Use: History  Smoking status  . Current Some Day Smoker -- 0.25 packs/day for 20 years  . Types: Cigarettes  Smokeless tobacco  . Not on file    Labs: Recent Review Flowsheet Data    Labs for ITP Cardiac and Pulmonary Rehab Latest Ref Rng 05/03/2013   Cholestrol 0-200 mg/dL 196   LDLCALC 0-100 mg/dL 121(H)   HDL 40-60 mg/dL 42   Trlycerides 0-200 mg/dL 164       Exercise Target Goals:    Exercise Program Goal: Individual exercise prescription set with THRR, safety & activity barriers. Participant  demonstrates ability to understand and report RPE using BORG scale, to self-measure pulse accurately, and to acknowledge the importance of the exercise prescription.  Exercise Prescription Goal: Starting with aerobic activity 30 plus minutes a day, 3 days per week for initial exercise prescription. Provide home exercise prescription and guidelines that participant acknowledges understanding prior to discharge.  Activity Barriers & Risk Stratification:     Activity Barriers & Risk Stratification - 09/26/14 1158    Activity Barriers & Risk Stratification   Activity Barriers Arthritis   Risk Stratification High      6 Minute Walk:     6 Minute Walk      09/26/14 1259       6 Minute Walk   Phase Initial     Distance 1330 feet     Walk Time 6 minutes     Resting HR 74 bpm     Resting BP 112/78 mmHg     Max Ex. HR 89 bpm     Max Ex. BP 130/70 mmHg     RPE 11     Symptoms No        Initial Exercise Prescription:     Initial Exercise Prescription - 09/26/14 1300    Date of Initial Exercise Prescription   Date 09/26/14   Treadmill   MPH 2.5   Grade 0   Minutes 15   Bike   Level 1   Minutes 15   Recumbant Bike   Level 2   Watts 30   Minutes 15   NuStep   Level 3   Watts 40   Minutes 15   Arm Ergometer   Level 1   Watts 10   Minutes 15   Arm/Foot Ergometer   Level 1   Watts 15   Minutes 15   Cybex   Level 2   RPM 20   Minutes 15   Recumbant Elliptical   Level 2   RPM 40   Watts 20   Minutes 15   Elliptical   Level 1   Speed 2.5   Minutes 5   REL-XR   Level 3   Watts 40   Minutes 15   Prescription Details   Frequency (times per week) 3   Duration Progress to 30 minutes of continuous aerobic without signs/symptoms of physical distress   Intensity   THRR REST +  30   Ratings of Perceived Exertion 11-13   Progression Continue progressive overload as per policy without signs/symptoms or physical distress.   Resistance Training   Training  Prescription Yes   Weight 2   Reps 10-12      Exercise Prescription Changes:     Exercise Prescription Changes  10/11/14 0700 10/17/14 0800 10/26/14 0800 11/02/14 0800     Exercise Review   Progression Yes Yes Yes Yes    Response to Exercise   Blood Pressure (Admit) 128/70 mmHg 128/70 mmHg      Blood Pressure (Exercise) 156/82 mmHg 156/82 mmHg      Blood Pressure (Exit) 126/72 mmHg 126/72 mmHg      Heart Rate (Admit) 74 bpm 74 bpm      Heart Rate (Exercise) 91 bpm 91 bpm      Heart Rate (Exit) 72 bpm 72 bpm      Rating of Perceived Exertion (Exercise) 11 11      Symptoms None None  None    Comments Reviewed individualized exercise prescription and made increases per departmental policy. Exercise increases were discussed with the patient and they were able to perform the new work loads without issue (no signs or symptoms).  Reviewed individualized exercise prescription and made increases per departmental policy. Exercise increases were discussed with the patient and they were able to perform the new work loads without issue (no signs or symptoms). [name] is doing well in class and is enjoying the program.   Talked with [name] about his current workloads and made increases in level on the bike and added an incline on the treadmill. [name] is encouaged by the progress he is making and appreciates the exercise challenges.     Duration Progress to 50 minutes of aerobic without signs/symptoms of physical distress Progress to 50 minutes of aerobic without signs/symptoms of physical distress  Progress to 50 minutes of aerobic without signs/symptoms of physical distress    Intensity Rest + 30 Rest + 30  Rest + 30    Progression Continue progressive overload as per policy without signs/symptoms or physical distress. Continue progressive overload as per policy without signs/symptoms or physical distress.  Continue progressive overload as per policy without signs/symptoms or physical distress.     Resistance Training   Training Prescription Yes Yes  Yes    Weight _0 Reps 10-15 10-15  10-15    Interval Training   Interval Training No No Yes Yes    Equipment   Recumbant Bike Recumbant Bike    Comments   HIIT was explained to the patient who wanted to begin with it on the [place].  Stated full understanding and demonstrated well. HIIT was explained to the patient who wanted to begin with it on the [place].  Stated full understanding and demonstrated well.    Treadmill   MPH 2.7 2.7  3    Grade 0 0  1    Minutes _1 Recumbant Bike   Level    12    RPM    45    Watts    80    Minutes    20    Recumbant Elliptical   Level 3  BioStep 3  BioStep  3  BioStep    Watts _2 Minutes _3 REL-XR   Level _4 Watts 60 65  65    Minutes _5 Discharge Exercise Prescription (Final Exercise Prescription Changes):     Exercise Prescription Changes - 11/02/14 0800    Exercise Review   Progression Yes   Response to Exercise   Symptoms None   Comments  Talked with [name] about his current workloads and made increases in level on the bike and added an incline on the treadmill. [name] is encouaged by the progress he is making and appreciates the exercise challenges.    Duration Progress to 50 minutes of aerobic without signs/symptoms of physical distress   Intensity Rest + 30   Progression Continue progressive overload as per policy without signs/symptoms or physical distress.   Resistance Training   Training Prescription Yes   Weight 3   Reps 10-15   Interval Training   Interval Training Yes   Equipment Recumbant Bike   Comments HIIT was explained to the patient who wanted to begin with it on the [place].  Stated full understanding and demonstrated well.   Treadmill   MPH 3   Grade 1   Minutes 20   Recumbant Bike   Level 12   RPM 45   Watts 80   Minutes 20   Recumbant Elliptical   Level 3  BioStep   Watts 30   Minutes 20   REL-XR   Level 6    Watts 65   Minutes 15      Nutrition:  Target Goals: Understanding of nutrition guidelines, daily intake of sodium <1561m, cholesterol <2043m calories 30% from fat and 7% or less from saturated fats, daily to have 5 or more servings of fruits and vegetables.  Biometrics:     Pre Biometrics - 09/26/14 1307    Pre Biometrics   Height 6' (1.829 m)   Weight 211 lb 9.6 oz (95.981 kg)   Waist Circumference 42.75 inches   Hip Circumference 41 inches   Waist to Hip Ratio 1.04 %   BMI (Calculated) 28.8       Nutrition Therapy Plan and Nutrition Goals:   Nutrition Discharge: Rate Your Plate Scores:   Nutrition Goals Re-Evaluation:     Nutrition Goals Re-Evaluation      10/12/14 1852           Personal Goal #1 Re-Evaluation   Personal Goal #1 I asked [email or web address] and he said he prefers not to meet individuallly with the Cardiac Rehab Registered Dietician since he feels he eats healthy now.        Goal Progress Seen Yes          Psychosocial: Target Goals: Acknowledge presence or absence of depression, maximize coping skills, provide positive support system. Participant is able to verbalize types and ability to use techniques and skills needed for reducing stress and depression.  Initial Review & Psychosocial Screening:     Initial Psych Review & Screening - 09/26/14 1201    Family Dynamics   Good Support System? Yes   Comments Lives alone  separated for over 4 years      Quality of Life Scores:     Quality of Life - 09/26/14 1300    Quality of Life Scores   Health/Function Pre 22.4 %   Socioeconomic Pre 28.93 %   Psych/Spiritual Pre 25.71 %   Family Pre 24 %   GLOBAL Pre 24.66 %      PHQ-9:     Recent Review Flowsheet Data    [facility] 09/26/2014   Decreased Interest 1   Down, Depressed, Hopeless 0   PHQ - 2 Score 1   Altered sleeping 1   Tired, decreased energy 1   Change in appetite 0   Feeling bad or failure about  yourself  0   Trouble  concentrating 0   Moving slowly or fidgety/restless 0   Suicidal thoughts 0   PHQ-9 Score 3   Difficult doing work/chores Somewhat difficult      Psychosocial Evaluation and Intervention:     Psychosocial Evaluation - 10/03/14 1012    Psychosocial Evaluation & Interventions   Interventions Stress management education;Relaxation education;Encouraged to exercise with the program and follow exercise prescription   Comments Counselor met today with Mr. Youtz for initial psychosocial evaluation.  He is a 69 year old who had a heart attack in July and is in this program for recovery.  Mr. Dolley has a strong support system with an adult daughter and a brother who live close by.  He reports he sleeps well and has a good appetite.  He denies a history of depression or anxiety or any current symptoms.  He has minimal stress in his life and states he is typically in a positive mood.  Mr. Macek goals are to increase his stamina and strength and "just to feel better" in this program.  He plans work out through a [employer] program following this program.     Continued Psychosocial Services Needed Yes  Mr. Donaway will benefit from the psychoeducational components of this program and consistent exercise.        Psychosocial Re-Evaluation:     Psychosocial Re-Evaluation      10/19/14 1200           Psychosocial Re-Evaluation   Interventions Stress management education;Encouraged to attend Cardiac Rehabilitation for the exercise       Comments Mr. Winch has rheumatoid arthritis which is a stressor and makes things harder for him incl mowing his yard. Was able to mow 1/2 of it without stopping which is better.           Vocational Rehabilitation: Provide vocational rehab assistance to qualifying candidates.   Vocational Rehab Evaluation & Intervention:     Vocational Rehab - 09/26/14 1158    Initial Vocational Rehab Evaluation & Intervention   Assessment  shows need for Vocational Rehabilitation No      Education: Education Goals: Education classes will be provided on a weekly basis, covering required topics. Participant will state understanding/return demonstration of topics presented.  Learning Barriers/Preferences:     Learning Barriers/Preferences - 09/26/14 1158    Learning Barriers/Preferences   Learning Barriers None   Learning Preferences None      Education Topics: General Nutrition Guidelines/Fats and Fiber: -Group instruction provided by verbal, written material, models and posters to present the general guidelines for heart healthy nutrition. Gives an explanation and review of dietary fats and fiber.          Cardiac Rehab from 11/02/2014 in [facility] Cardiac Rehab   Date  10/17/14   Educator  PI   Instruction Review Code  2- meets goals/outcomes      Controlling Sodium/Reading Food Labels: -Group verbal and written material supporting the discussion of sodium use in heart healthy nutrition. Review and explanation with models, verbal and written materials for utilization of the food label.      Cardiac Rehab from 11/02/2014 in [facility] Cardiac Rehab   Date  10/31/14   Educator  CR   Instruction Review Code  2- meets goals/outcomes      Exercise Physiology & Risk Factors: - Group verbal and written instruction with models to review the exercise physiology of the cardiovascular system and associated critical values. Details cardiovascular disease risk factors and the goals associated with  each risk factor.   Aerobic Exercise & Resistance Training: - Gives group verbal and written discussion on the health impact of inactivity. On the components of aerobic and resistive training programs and the benefits of this training and how to safely progress through these programs.   Flexibility, Balance, General Exercise Guidelines: - Provides group verbal and written instruction on the benefits of flexibility and balance training  programs. Provides general exercise guidelines with specific guidelines to those with heart or lung disease. Demonstration and skill practice provided.   Stress Management: - Provides group verbal and written instruction about the health risks of elevated stress, cause of high stress, and healthy ways to reduce stress.   Depression: - Provides group verbal and written instruction on the correlation between heart/lung disease and depressed mood, treatment options, and the stigmas associated with seeking treatment.      Cardiac Rehab from 11/02/2014 in [facility] Cardiac Rehab   Date  11/02/14   Educator  [facility]   Instruction Review Code  2- meets goals/outcomes      Anatomy & Physiology of the Heart: - Group verbal and written instruction and models provide basic cardiac anatomy and physiology, with the coronary electrical and arterial systems. Review of: AMI, Angina, Valve disease, Heart Failure, Cardiac Arrhythmia, Pacemakers, and the ICD.      Cardiac Rehab from 11/02/2014 in [facility] Cardiac Rehab   Date  10/03/14   Educator  SB   Instruction Review Code  2- meets goals/outcomes      Cardiac Procedures: - Group verbal and written instruction and models to describe the testing methods done to diagnose heart disease. Reviews the outcomes of the test results. Describes the treatment choices: Medical Management, Angioplasty, or Coronary Bypass Surgery.      Cardiac Rehab from 11/02/2014 in [facility] Cardiac Rehab   Date  10/24/14   Educator  SB   Instruction Review Code  2- meets goals/outcomes      Cardiac Medications: - Group verbal and written instruction to review commonly prescribed medications for heart disease. Reviews the medication, class of the drug, and side effects. Includes the steps to properly store meds and maintain the prescription regimen.   Go Sex-Intimacy & Heart Disease, Get SMART - Goal Setting: - Group verbal and written instruction through game format to discuss heart  disease and the return to sexual intimacy. Provides group verbal and written material to discuss and apply goal setting through the application of the S.M.A.R.T. Method.      Cardiac Rehab from 11/02/2014 in [facility] Cardiac Rehab   Date  10/24/14   Educator  SB   Instruction Review Code  2- meets goals/outcomes      Other Matters of the Heart: - Provides group verbal, written materials and models to describe Heart Failure, Angina, Valve Disease, and Diabetes in the realm of heart disease. Includes description of the disease process and treatment options available to the cardiac patient.      Cardiac Rehab from 11/02/2014 in [facility] Cardiac Rehab   Date  10/12/14   Educator  [place]   Instruction Review Code  2- meets goals/outcomes      Exercise & Equipment Safety: - Individual verbal instruction and demonstration of equipment use and safety with use of the equipment.      Cardiac Rehab from 11/02/2014 in [facility] Cardiac Rehab   Date  09/26/14   Educator  SB   Instruction Review Code  2- meets goals/outcomes      Infection Prevention: -  Provides verbal and written material to individual with discussion of infection control including proper hand washing and proper equipment cleaning during exercise session.      Cardiac Rehab from 11/02/2014 in [facility] Cardiac Rehab   Date  09/26/14   Educator  SB   Instruction Review Code  2- meets goals/outcomes      Falls Prevention: - Provides verbal and written material to individual with discussion of falls prevention and safety.      Cardiac Rehab from 11/02/2014 in [facility] Cardiac Rehab   Date  09/26/14   Educator  SB   Instruction Review Code  2- meets goals/outcomes      Diabetes: - Individual verbal and written instruction to review signs/symptoms of diabetes, desired ranges of glucose level fasting, after meals and with exercise. Advice that pre and post exercise glucose checks will be done for 3 sessions at entry of program.    Knowledge  Questionnaire Score:     Knowledge Questionnaire Score - 09/26/14 1300    Knowledge Questionnaire Score   Pre Score 19/28      Personal Goals and Risk Factors at Admission:     Personal Goals and Risk Factors at Admission - 09/26/14 1202    Personal Goals and Risk Factors on Admission   Increase Aerobic Exercise and Physical Activity Yes;Sedentary   Intervention While in program, learn and follow the exercise prescription taught. Start at a low level workload and increase workload after able to maintain previous level for 30 minutes. Increase time before increasing intensity.   Intervention Provide exercise education and an individualized exercise prescription that will provide continued progressive overload as per policy without signs/symptoms of physical distress.   Quit Smoking Yes   Number of packs per day 3 to 4 cigarettes a day   Intervention Utilize your health care professional team to help with smoking cessation while in the program. Your doctor can prescribe medications to aid in cessation. The program can provide information and counseling as needed.   Take Less Medication Yes   Intervention Learn your risk factors and begin the lifestyle modifications for risk factor control during your time in the program.   Diabetes No   Hypertension Yes   Goal Participant will see blood pressure controlled within the values of 140/63m/Hg or within value directed by their physician.   Intervention Provide nutrition & aerobic exercise along with prescribed medications to achieve BP 140/90 or less.   Lipids Yes   Goal Cholesterol controlled with medications as prescribed, with individualized exercise RX and with personalized nutrition plan. Value goals: LDL < 736m HDL > 4083mParticipant states understanding of desired cholesterol values and following prescriptions.   Intervention Provide nutrition & aerobic exercise along with prescribed medications to achieve LDL <51m24mDL >40mg54m    Personal Goals and Risk Factors Review:      Goals and Risk Factor Review      10/19/14 1201 10/21/14 0937 11/02/14 0929 11/02/14 1200     Increase Aerobic Exercise and Physical Activity   Goals Progress/Improvement seen  Yes Yes Yes     Comments Mr. ParkeSessumsrheumatoid arthritis which is a stressor and makes things harder for him incl mowing his yard. Was able to mow 1/2 of it without stopping which is better [name] iWille Glasermproving on all of the exercise equipment and is up to 2.7mph 65mthe treadmill. He is very compliant with exercise increases and he is encouraged by all the progress he has  made so far on the equipment.  Making significant gains on equipment, however, still experiencing some leg pain due to medications.     Quit Smoking   Goals Progress/Improvement seen    Yes;No    Comments    [name] continues to smoke 1-3 cigarettes a day. He is not ready to quit yet. We did review options in the community when he is ready to quit. Introduced Lobbyist to him.    Hypertension   Goal --  Mr. Tabbert blood pressure is well controlled.   Participant will see blood pressure controlled within the values of 140/19m/Hg or within value directed by their physician. Participant will see blood pressure controlled within the values of 140/972mHg or within value directed by their physician.    Progress seen toward goals    Yes    Comments    [name] is aware of his BP readings and is maintianing good numbers. He is watching sodium intake and eating as healthy as he can.    Abnormal Lipids   Goal   Cholesterol controlled with medications as prescribed, with individualized exercise RX and with personalized nutrition plan. Value goals: LDL < 7050mHDL > 55m57marticipant states understanding of desired cholesterol values and following prescriptions.  medication side effects, still monitoring blood panel for changies Cholesterol controlled with medications as prescribed, with individualized exercise RX  and with personalized nutrition plan. Value goals: LDL < 70mg80mL > 55mg.29mticipant states understanding of desired cholesterol values and following prescriptions.    Progress seen towards goals    Unknown    Comments    [name] haWille Glaserot had a labs recently. He does not like taking the medication for cholesterol control as it causes muscle aches off and on. His doctor is aware of the symptoms and has made several changes in the medication . [name] continues to have the muscle soreness and is living with it. The soreness is not consistent.       Personal Goals Discharge (Final Personal Goals and Risk Factors Review):      Goals and Risk Factor Review - 11/02/14 1200    Quit Smoking   Goals Progress/Improvement seen Yes;No   Comments [name] continues to smoke 1-3 cigarettes a day. He is not ready to quit yet. We did review options in the community when he is ready to quit. Introduced Quit[employer]obbyistm.   Hypertension   Goal Participant will see blood pressure controlled within the values of 140/90mm/H58m within value directed by their physician.   Progress seen toward goals Yes   Comments [name] is aware of his BP readings and is maintianing good numbers. He is watching sodium intake and eating as healthy as he can.   Abnormal Lipids   Goal Cholesterol controlled with medications as prescribed, with individualized exercise RX and with personalized nutrition plan. Value goals: LDL < 70mg, H18m 55mg. Pa102mipant states understanding of desired cholesterol values and following prescriptions.   Progress seen towards goals Unknown   Comments [name] has nWille Glaserhad a labs recently. He does not like taking the medication for cholesterol control as it causes muscle aches off and on. His doctor is aware of the symptoms and has made several changes in the medication . [name] continues to have the muscle soreness and is living with it. The soreness is not consistent.       Comments: No problems with Cardiac Rehab.   9

## 2014-11-04 ENCOUNTER — Encounter: Payer: Medicare PPO | Admitting: *Deleted

## 2014-11-04 DIAGNOSIS — I2119 ST elevation (STEMI) myocardial infarction involving other coronary artery of inferior wall: Secondary | ICD-10-CM | POA: Diagnosis not present

## 2014-11-04 NOTE — Progress Notes (Signed)
Daily Session Note  Patient Details  Name: Martin Adkins MRN: [id number] Date of Birth: Dec 03, 1945 Referring Provider:  Corey Skains, MD  Encounter Date: 11/04/2014  Check In:     Session Check In - 11/04/14 0912    Check-In   Staff Present Heath Lark RN, BSN, CCRP;Renee Dillard Essex MS, ACSM CEP Exercise Physiologist;Bridie Colquhoun RN, BSN   ER physicians immediately available to respond to emergencies See telemetry face sheet for immediately available ER MD   Medication changes reported     No   Fall or balance concerns reported    No   Warm-up and Cool-down Performed on first and last piece of equipment   VAD Patient? No   Pain Assessment   Currently in Pain? No/denies         Goals Met:  Proper associated with RPD/PD & O2 Sat Exercise tolerated well No report of cardiac concerns or symptoms  Goals Unmet:  Not Applicable  Goals Comments:    Dr. Emily Filbert is Medical Director for [facility].

## 2014-11-07 ENCOUNTER — Encounter: Payer: Medicare PPO | Admitting: *Deleted

## 2014-11-07 DIAGNOSIS — I2119 ST elevation (STEMI) myocardial infarction involving other coronary artery of inferior wall: Secondary | ICD-10-CM | POA: Diagnosis not present

## 2014-11-07 NOTE — Progress Notes (Signed)
Daily Session Note  Patient Details  Name: Martin Adkins MRN: [id number] Date of Birth: [birth year] Referring Provider:  Corey Skains, MD  Encounter Date: 11/07/2014  Check In:     Session Check In - 11/07/14 0827    Check-In   Staff Present Candiss Norse MS, ACSM CEP Exercise Physiologist;Susanne Bice RN, BSN, CCRP;Resean Brander Alfonso Patten, ACSM CEP Exercise Physiologist   ER physicians immediately available to respond to emergencies See telemetry face sheet for immediately available ER MD   Medication changes reported     No   Fall or balance concerns reported    No   Warm-up and Cool-down Performed on first and last piece of equipment   VAD Patient? No   Pain Assessment   Currently in Pain? No/denies   Multiple Pain Sites No         Goals Met:  Independence with exercise equipment Exercise tolerated well No report of cardiac concerns or symptoms Strength training completed today  Goals Unmet:  Not Applicable  Goals Comments: Patient completed exercise prescription and all exercise goals during rehab session. The exercise was tolerated well and the patient is progressing in the program.     Dr. Emily Filbert is Medical Director for [facility].

## 2014-11-08 ENCOUNTER — Encounter: Payer: Self-pay | Admitting: *Deleted

## 2014-11-09 ENCOUNTER — Encounter: Payer: Medicare PPO | Attending: Internal Medicine

## 2014-11-09 DIAGNOSIS — I2119 ST elevation (STEMI) myocardial infarction involving other coronary artery of inferior wall: Secondary | ICD-10-CM | POA: Diagnosis present

## 2014-11-09 NOTE — Progress Notes (Signed)
Daily Session Note  Patient Details  Name: NIKOLUS MARCZAK MRN: [id number] Date of Birth: 1945-06-16 Referring Provider:  Corey Skains, MD  Encounter Date: 11/09/2014  Check In:     Session Check In - 11/09/14 0821    Check-In   Staff Present Lestine Box BS, ACSM EP-C, Exercise Physiologist;Renee Dillard Essex MS, ACSM CEP Exercise Physiologist;Diane Mariana Arn, BSN   ER physicians immediately available to respond to emergencies See telemetry face sheet for immediately available ER MD   Medication changes reported     No   Fall or balance concerns reported    No   Warm-up and Cool-down Performed on first and last piece of equipment   VAD Patient? No   Pain Assessment   Currently in Pain? No/denies         Goals Met:  Proper associated with RPD/PD & O2 Sat Exercise tolerated well No report of cardiac concerns or symptoms Strength training completed today  Goals Unmet:  Not Applicable  Goals Comments:    Dr. Emily Filbert is Medical Director for [facility].

## 2014-11-11 ENCOUNTER — Encounter: Payer: Medicare PPO | Admitting: *Deleted

## 2014-11-11 DIAGNOSIS — I2119 ST elevation (STEMI) myocardial infarction involving other coronary artery of inferior wall: Secondary | ICD-10-CM

## 2014-11-11 NOTE — Progress Notes (Signed)
Daily Session Note  Patient Details  Name: DERYCK HIPPLER MRN: [id number] Date of Birth: 06-04-1945 Referring Provider:  Madelyn Brunner, MD  Encounter Date: 11/11/2014  Check In:     Session Check In - 11/11/14 0921    Check-In   Staff Present Candiss Norse MS, ACSM CEP Exercise Physiologist;Quantavius Humm RN, BSN;Susanne Bice RN, BSN, [place]   ER physicians immediately available to respond to emergencies See telemetry face sheet for immediately available ER MD   Medication changes reported     No   Fall or balance concerns reported    No   Warm-up and Cool-down Performed on first and last piece of equipment   VAD Patient? No   Pain Assessment   Currently in Pain? No/denies         Goals Met:  Proper associated with RPD/PD & O2 Sat Exercise tolerated well No report of cardiac concerns or symptoms  Goals Unmet:  Not Applicable  Goals Comments:    Dr. Emily Filbert is Medical Director for [facility].

## 2014-11-14 ENCOUNTER — Encounter: Payer: Self-pay | Admitting: *Deleted

## 2014-11-14 ENCOUNTER — Encounter: Payer: Medicare PPO | Admitting: *Deleted

## 2014-11-14 DIAGNOSIS — I2119 ST elevation (STEMI) myocardial infarction involving other coronary artery of inferior wall: Secondary | ICD-10-CM

## 2014-11-14 NOTE — Progress Notes (Signed)
Cardiac Individual Treatment Plan  Patient Details  Name: Martin Adkins MRN: [id number] Date of Birth: [birth year] Referring Provider:  Corey Skains, MD  Initial Encounter Date:    Visit Diagnosis: ST elevation myocardial infarction (STEMI) involving other coronary artery of inferior wall ([place])  Patient's Home Medications on Admission:  Current outpatient prescriptions:  .  amLODipine (NORVASC) 5 MG tablet, 10 mg daily. , Disp: , Rfl:  .  aspirin 81 MG tablet, Take 1 tablet by mouth daily., Disp: , Rfl:  .  aspirin EC 81 MG tablet, Take by mouth., Disp: , Rfl:  .  atorvastatin (LIPITOR) 80 MG tablet, TAKE 1 TABLET (80 MG TOTAL) BY MOUTH ONCE DAILY., Disp: , Rfl: 11 .  benazepril (LOTENSIN) 20 MG tablet, Take 1 tablet by mouth daily., Disp: , Rfl:  .  chlorthalidone (HYGROTON) 25 MG tablet, Take 1 tablet by mouth daily., Disp: , Rfl:  .  fluticasone (FLONASE) 50 MCG/ACT nasal spray, , Disp: , Rfl:  .  Heat Wraps (HEAT THERAPY PATCHES) MISC, , Disp: , Rfl:  .  hydroxychloroquine (PLAQUENIL) 200 MG tablet, , Disp: , Rfl:  .  isosorbide mononitrate (IMDUR) 30 MG 24 hr tablet, , Disp: , Rfl:  .  LUBRICANT EYE DROPS 0.4-0.3 % SOLN, , Disp: , Rfl:  .  metoprolol succinate (TOPROL-XL) 100 MG 24 hr tablet, , Disp: , Rfl:  .  mirabegron ER (MYRBETRIQ) 25 MG TB24 tablet, Take 1 tablet (25 mg total) by mouth daily., Disp: 30 tablet, Rfl: 0 .  Naphazoline-Polyethyl Glycol 0.012-0.2 % SOLN, Place 1 drop into both eyes daily., Disp: , Rfl:  .  Neomycin-Bacitracin-Polymyxin (HCA TRIPLE ANTIBIOTIC OINTMENT EX), , Disp: , Rfl:  .  pantoprazole (PROTONIX) 40 MG tablet, , Disp: , Rfl:  .  Polyvinyl Alcohol (LUBRICANT DROPS OP), Place 1 drop into both eyes daily., Disp: , Rfl:  .  potassium chloride SA (K-DUR,KLOR-CON) 20 MEQ tablet, , Disp: , Rfl:  .  valsartan (DIOVAN) 160 MG tablet, Take 160 mg by mouth daily., Disp: , Rfl:  .  warfarin (COUMADIN) 2.5 MG tablet, 2.5 mg. 5 mg one day alternate  with 7 mg next day, Disp: , Rfl:  .  warfarin (COUMADIN) 5 MG tablet, TAKE 1 TABLET (5 MG TOTAL) BY MOUTH ONCE DAILY., Disp: , Rfl: 5  Past Medical History: Past Medical History  Diagnosis Date  . Hyperlipemia   . RA (rheumatoid arthritis) ([place])   . OSA (obstructive sleep apnea)   . PVD (peripheral vascular disease) ([place])   . GERD (gastroesophageal reflux disease)   . Osteoarthritis   . NSTEMI (non-ST elevated myocardial infarction) ([place])   . Hyperplastic polyps of stomach   . Barrett's esophagus   . Diabetes ([place])   . DVT (deep venous thrombosis) ([place])   . Abdominal aortic aneurysm without rupture ([place])   . CAD (coronary artery disease)   . SOB (shortness of breath)   . CHF (congestive heart failure) ([place])   . Hypertension   . Arthritis   . BPH (benign prostatic hyperplasia)   . AAA (abdominal aortic aneurysm) without rupture ([place]) 04/05/2014  . Benign essential HTN 05/02/2014  . Arteriosclerosis of coronary artery 04/05/2014    Overview:  Sp cabg with lima to lad svg to d1 om1 rca 2004 with occluded svg to rca and d1 and pci stetn of om1 graft 2015   . Chronic systolic heart failure ([place]) 04/19/2014    Overview:  With segmental LV systolic dysfunction ejection  fraction of 35%   . Antepartum deep phlebothrombosis ([place]) 07/28/2014  . Acute non-ST elevation myocardial infarction (NSTEMI) ([place]) 08/15/2014  . Myocardial infarction ([place]) 07/30/2014    Tobacco Use: History  Smoking status  . Current Some Day Smoker -- 0.25 packs/day for 20 years  . Types: Cigarettes  Smokeless tobacco  . Not on file    Labs: Recent Review Flowsheet Data    Labs for ITP Cardiac and Pulmonary Rehab Latest Ref Rng 05/03/2013   Cholestrol 0-200 mg/dL 196   LDLCALC 0-100 mg/dL 121(H)   HDL 40-60 mg/dL 42   Trlycerides 0-200 mg/dL 164       Exercise Target Goals:    Exercise Program Goal: Individual exercise prescription set with THRR, safety & activity barriers. Participant demonstrates  ability to understand and report RPE using BORG scale, to self-measure pulse accurately, and to acknowledge the importance of the exercise prescription.  Exercise Prescription Goal: Starting with aerobic activity 30 plus minutes a day, 3 days per week for initial exercise prescription. Provide home exercise prescription and guidelines that participant acknowledges understanding prior to discharge.  Activity Barriers & Risk Stratification:     Activity Barriers & Risk Stratification - 09/26/14 1158    Activity Barriers & Risk Stratification   Activity Barriers Arthritis   Risk Stratification High      6 Minute Walk:     6 Minute Walk      09/26/14 1259       6 Minute Walk   Phase Initial     Distance 1330 feet     Walk Time 6 minutes     Resting HR 74 bpm     Resting BP 112/78 mmHg     Max Ex. HR 89 bpm     Max Ex. BP 130/70 mmHg     RPE 11     Symptoms No        Initial Exercise Prescription:     Initial Exercise Prescription - 09/26/14 1300    Date of Initial Exercise Prescription   Date 09/26/14   Treadmill   MPH 2.5   Grade 0   Minutes 15   Bike   Level 1   Minutes 15   Recumbant Bike   Level 2   Watts 30   Minutes 15   NuStep   Level 3   Watts 40   Minutes 15   Arm Ergometer   Level 1   Watts 10   Minutes 15   Arm/Foot Ergometer   Level 1   Watts 15   Minutes 15   Cybex   Level 2   RPM 20   Minutes 15   Recumbant Elliptical   Level 2   RPM 40   Watts 20   Minutes 15   Elliptical   Level 1   Speed 2.5   Minutes 5   REL-XR   Level 3   Watts 40   Minutes 15   Prescription Details   Frequency (times per week) 3   Duration Progress to 30 minutes of continuous aerobic without signs/symptoms of physical distress   Intensity   THRR REST +  30   Ratings of Perceived Exertion 11-13   Progression Continue progressive overload as per policy without signs/symptoms or physical distress.   Resistance Training   Training Prescription Yes    Weight 2   Reps 10-12      Exercise Prescription Changes:     Exercise Prescription Changes  10/11/14 0700 10/17/14 0800 10/26/14 0800 11/02/14 0800 11/08/14 0600   Exercise Review   Progression Yes Yes Yes Yes Yes   Response to Exercise   Blood Pressure (Admit) 128/70 mmHg 128/70 mmHg   130/78 mmHg   Blood Pressure (Exercise) 156/82 mmHg 156/82 mmHg   132/80 mmHg   Blood Pressure (Exit) 126/72 mmHg 126/72 mmHg   120/74 mmHg   Heart Rate (Admit) 74 bpm 74 bpm   81 bpm   Heart Rate (Exercise) 91 bpm 91 bpm   102 bpm   Heart Rate (Exit) 72 bpm 72 bpm   68 bpm   Rating of Perceived Exertion (Exercise) '11 11   13   ' Symptoms None None  None None   Comments Reviewed individualized exercise prescription and made increases per departmental policy. Exercise increases were discussed with the patient and they were able to perform the new work loads without issue (no signs or symptoms).  Reviewed individualized exercise prescription and made increases per departmental policy. Exercise increases were discussed with the patient and they were able to perform the new work loads without issue (no signs or symptoms). [name] is doing well in class and is enjoying the program.   Talked with [name] about his current workloads and made increases in level on the bike and added an incline on the treadmill. [name] is encouaged by the progress he is making and appreciates the exercise challenges.  Discussed adding one day a week of exercise at home in addition to the three days a week at [facility]. Explained that 150 minutes a week of exercise is the goal for combating and managing chronic health conditions. This volume of exercise is also proven to give other health benefits.    Frequency     Add 1 additional day to program exercise sessions.   Duration Progress to 50 minutes of aerobic without signs/symptoms of physical distress Progress to 50 minutes of aerobic without signs/symptoms of physical distress  Progress to 50  minutes of aerobic without signs/symptoms of physical distress Progress to 50 minutes of aerobic without signs/symptoms of physical distress   Intensity Rest + 30 Rest + 30  Rest + 30 Rest + 30   Progression Continue progressive overload as per policy without signs/symptoms or physical distress. Continue progressive overload as per policy without signs/symptoms or physical distress.  Continue progressive overload as per policy without signs/symptoms or physical distress. Continue progressive overload as per policy without signs/symptoms or physical distress.   Resistance Training   Training Prescription Yes Yes  Yes Yes   Weight '3 3  3 3   ' Reps 10-15 10-15  10-15 10-15   Interval Training   Interval Training No No Yes Yes Yes   Equipment   Recumbant Bike Recumbant Bike Recumbant Bike   Comments   HIIT was explained to the patient who wanted to begin with it on the [place].  Stated full understanding and demonstrated well. HIIT was explained to the patient who wanted to begin with it on the [place].  Stated full understanding and demonstrated well. HIIT was explained to the patient who wanted to begin with it on the [place].  Stated full understanding and demonstrated well.   Treadmill   MPH 2.7 2.'7  3 3   ' Grade 0 0  1 1   Minutes '20 20  20 20   ' Recumbant Bike   Level    12 12   RPM    45 45   Watts  80 80   Minutes    20 20   NuStep   Level     6  T5   Watts     40   Minutes     20   Recumbant Elliptical   Level 3  BioStep 3  BioStep  3  BioStep 3  BioStep   Watts '30 30  30 30   ' Minutes '20 20  20 20   ' REL-XR   Level '3 6  6 6   ' Watts 60 65  65 65   Minutes '15 15  15 15      ' Discharge Exercise Prescription (Final Exercise Prescription Changes):     Exercise Prescription Changes - 11/08/14 0600    Exercise Review   Progression Yes   Response to Exercise   Blood Pressure (Admit) 130/78 mmHg   Blood Pressure (Exercise) 132/80 mmHg   Blood Pressure (Exit) 120/74 mmHg   Heart Rate  (Admit) 81 bpm   Heart Rate (Exercise) 102 bpm   Heart Rate (Exit) 68 bpm   Rating of Perceived Exertion (Exercise) 13   Symptoms None   Comments Discussed adding one day a week of exercise at home in addition to the three days a week at [facility]. Explained that 150 minutes a week of exercise is the goal for combating and managing chronic health conditions. This volume of exercise is also proven to give other health benefits.    Frequency Add 1 additional day to program exercise sessions.   Duration Progress to 50 minutes of aerobic without signs/symptoms of physical distress   Intensity Rest + 30   Progression Continue progressive overload as per policy without signs/symptoms or physical distress.   Resistance Training   Training Prescription Yes   Weight 3   Reps 10-15   Interval Training   Interval Training Yes   Equipment Recumbant Bike   Comments HIIT was explained to the patient who wanted to begin with it on the [place].  Stated full understanding and demonstrated well.   Treadmill   MPH 3   Grade 1   Minutes 20   Recumbant Bike   Level 12   RPM 45   Watts 80   Minutes 20   NuStep   Level 6  T5   Watts 40   Minutes 20   Recumbant Elliptical   Level 3  BioStep   Watts 30   Minutes 20   REL-XR   Level 6   Watts 65   Minutes 15      Nutrition:  Target Goals: Understanding of nutrition guidelines, daily intake of sodium <1568m, cholesterol <2083m calories 30% from fat and 7% or less from saturated fats, daily to have 5 or more servings of fruits and vegetables.  Biometrics:     Pre Biometrics - 09/26/14 1307    Pre Biometrics   Height 6' (1.829 m)   Weight 211 lb 9.6 oz (95.981 kg)   Waist Circumference 42.75 inches   Hip Circumference 41 inches   Waist to Hip Ratio 1.04 %   BMI (Calculated) 28.8       Nutrition Therapy Plan and Nutrition Goals:   Nutrition Discharge: Rate Your Plate Scores:   Nutrition Goals Re-Evaluation:     Nutrition Goals  Re-Evaluation      10/12/14 1852 11/11/14 1123         Personal Goal #1 Re-Evaluation   Personal Goal #1 I asked [email or web address] and he said he prefers not  to meet individuallly with the Cardiac Rehab Registered Dietician since he feels he eats healthy now.        Goal Progress Seen Yes Yes      Comments  He feels the Cardiac Rehab registered dietician answered all his questions in her lecture/education session so he wanted [place] to cancel his individual appt with her. I sent Karolee Stamps, RD an email to cancel appt.         Psychosocial: Target Goals: Acknowledge presence or absence of depression, maximize coping skills, provide positive support system. Participant is able to verbalize types and ability to use techniques and skills needed for reducing stress and depression.  Initial Review & Psychosocial Screening:     Initial Psych Review & Screening - 09/26/14 1201    Family Dynamics   Good Support System? Yes   Comments Lives alone  separated for over 4 years      Quality of Life Scores:     Quality of Life - 09/26/14 1300    Quality of Life Scores   Health/Function Pre 22.4 %   Socioeconomic Pre 28.93 %   Psych/Spiritual Pre 25.71 %   Family Pre 24 %   GLOBAL Pre 24.66 %      PHQ-9:     Recent Review Flowsheet Data    Depression screen [facility] 2/9 09/26/2014   Decreased Interest 1   Down, Depressed, Hopeless 0   PHQ - 2 Score 1   Altered sleeping 1   Tired, decreased energy 1   Change in appetite 0   Feeling bad or failure about yourself  0   Trouble concentrating 0   Moving slowly or fidgety/restless 0   Suicidal thoughts 0   PHQ-9 Score 3   Difficult doing work/chores Somewhat difficult      Psychosocial Evaluation and Intervention:     Psychosocial Evaluation - 10/03/14 1012    Psychosocial Evaluation & Interventions   Interventions Stress management education;Relaxation education;Encouraged to exercise with the program and follow exercise  prescription   Comments Counselor met today with Mr. Mesta for initial psychosocial evaluation.  He is a 69 year old who had a heart attack in July and is in this program for recovery.  Mr. Schoeneck has a strong support system with an adult daughter and a brother who live close by.  He reports he sleeps well and has a good appetite.  He denies a history of depression or anxiety or any current symptoms.  He has minimal stress in his life and states he is typically in a positive mood.  Mr. Blanchard goals are to increase his stamina and strength and "just to feel better" in this program.  He plans work out through a [employer] program following this program.     Continued Psychosocial Services Needed Yes  Mr. Gullett will benefit from the psychoeducational components of this program and consistent exercise.        Psychosocial Re-Evaluation:     Psychosocial Re-Evaluation      10/19/14 1200           Psychosocial Re-Evaluation   Interventions Stress management education;Encouraged to attend Cardiac Rehabilitation for the exercise       Comments Mr. Minus has rheumatoid arthritis which is a stressor and makes things harder for him incl mowing his yard. Was able to mow 1/2 of it without stopping which is better.           Vocational Rehabilitation: Provide vocational rehab assistance to  qualifying candidates.   Vocational Rehab Evaluation & Intervention:     Vocational Rehab - 09/26/14 1158    Initial Vocational Rehab Evaluation & Intervention   Assessment shows need for Vocational Rehabilitation No      Education: Education Goals: Education classes will be provided on a weekly basis, covering required topics. Participant will state understanding/return demonstration of topics presented.  Learning Barriers/Preferences:     Learning Barriers/Preferences - 09/26/14 1158    Learning Barriers/Preferences   Learning Barriers None   Learning Preferences None      Education  Topics: General Nutrition Guidelines/Fats and Fiber: -Group instruction provided by verbal, written material, models and posters to present the general guidelines for heart healthy nutrition. Gives an explanation and review of dietary fats and fiber.          Cardiac Rehab from 11/14/2014 in [facility] Cardiac Rehab   Date  10/17/14   Educator  PI   Instruction Review Code  2- meets goals/outcomes      Controlling Sodium/Reading Food Labels: -Group verbal and written material supporting the discussion of sodium use in heart healthy nutrition. Review and explanation with models, verbal and written materials for utilization of the food label.      Cardiac Rehab from 11/14/2014 in [facility] Cardiac Rehab   Date  10/31/14   Educator  CR   Instruction Review Code  2- meets goals/outcomes      Exercise Physiology & Risk Factors: - Group verbal and written instruction with models to review the exercise physiology of the cardiovascular system and associated critical values. Details cardiovascular disease risk factors and the goals associated with each risk factor.      Cardiac Rehab from 11/14/2014 in [facility] Cardiac Rehab   Date  11/09/14   Educator  RM   Instruction Review Code  2- meets goals/outcomes      Aerobic Exercise & Resistance Training: - Gives group verbal and written discussion on the health impact of inactivity. On the components of aerobic and resistive training programs and the benefits of this training and how to safely progress through these programs.      Cardiac Rehab from 11/14/2014 in [facility] Cardiac Rehab   Date  11/14/14   Educator  RM   Instruction Review Code  2- meets goals/outcomes      Flexibility, Balance, General Exercise Guidelines: - Provides group verbal and written instruction on the benefits of flexibility and balance training programs. Provides general exercise guidelines with specific guidelines to those with heart or lung disease. Demonstration and skill practice  provided.   Stress Management: - Provides group verbal and written instruction about the health risks of elevated stress, cause of high stress, and healthy ways to reduce stress.   Depression: - Provides group verbal and written instruction on the correlation between heart/lung disease and depressed mood, treatment options, and the stigmas associated with seeking treatment.      Cardiac Rehab from 11/14/2014 in [facility] Card[facility] Re[facility]   Date  11/02/14   Educator  [facility]   Instruction Review Code  2- meets goals/outcomes      Anatomy & Physiology of the Heart: - Group verbal and written instruction and models provide basic cardiac anatomy and physiology, with the coronary electrical and arterial systems. Review of: AMI, Angina, Valve disease, Heart Failure, Cardiac Arrhythmia, Pacemakers, and the ICD.      Cardiac Rehab from 11/14/2014 in [facility] Cardiac Rehab   Date  10/03/14   Educator  SB   Instruction  Review Code  2- meets goals/outcomes      Cardiac Procedures: - Group verbal and written instruction and models to describe the testing methods done to diagnose heart disease. Reviews the outcomes of the test results. Describes the treatment choices: Medical Management, Angioplasty, or Coronary Bypass Surgery.      Cardiac Rehab from 11/14/2014 in [facility] Cardiac Rehab   Date  10/24/14   Educator  SB   Instruction Review Code  2- meets goals/outcomes      Cardiac Medications: - Group verbal and written instruction to review commonly prescribed medications for heart disease. Reviews the medication, class of the drug, and side effects. Includes the steps to properly store meds and maintain the prescription regimen.      Cardiac Rehab from 11/14/2014 in [facility] Cardiac Rehab   Date  11/07/14   Educator  SB   Instruction Review Code  2- meets goals/outcomes      Go Sex-Intimacy & Heart Disease, Get SMART - Goal Setting: - Group verbal and written instruction through game format to discuss heart  disease and the return to sexual intimacy. Provides group verbal and written material to discuss and apply goal setting through the application of the S.M.A.R.T. Method.      Cardiac Rehab from 11/14/2014 in [facility] Cardiac Rehab   Date  10/24/14   Educator  SB   Instruction Review Code  2- meets goals/outcomes      Other Matters of the Heart: - Provides group verbal, written materials and models to describe Heart Failure, Angina, Valve Disease, and Diabetes in the realm of heart disease. Includes description of the disease process and treatment options available to the cardiac patient.      Cardiac Rehab from 11/14/2014 in [facility] Cardiac Rehab   Date  10/12/14   Educator  [place]   Instruction Review Code  2- meets goals/outcomes      Exercise & Equipment Safety: - Individual verbal instruction and demonstration of equipment use and safety with use of the equipment.      Cardiac Rehab from 11/14/2014 in [facility] Cardiac Rehab   Date  09/26/14   Educator  SB   Instruction Review Code  2- meets goals/outcomes      Infection Prevention: - Provides verbal and written material to individual with discussion of infection control including proper hand washing and proper equipment cleaning during exercise session.      Cardiac Rehab from 11/14/2014 in [facility] Cardiac Rehab   Date  09/26/14   Educator  SB   Instruction Review Code  2- meets goals/outcomes      Falls Prevention: - Provides verbal and written material to individual with discussion of falls prevention and safety.      Cardiac Rehab from 11/14/2014 in [facility] Cardiac Rehab   Date  09/26/14   Educator  SB   Instruction Review Code  2- meets goals/outcomes      Diabetes: - Individual verbal and written instruction to review signs/symptoms of diabetes, desired ranges of glucose level fasting, after meals and with exercise. Advice that pre and post exercise glucose checks will be done for 3 sessions at entry of program.    Knowledge  Questionnaire Score:     Knowledge Questionnaire Score - 09/26/14 1300    Knowledge Questionnaire Score   Pre Score 19/28      Personal Goals and Risk Factors at Admission:     Personal Goals and Risk Factors at Admission - 09/26/14 1202    Personal Goals  and Risk Factors on Admission   Increase Aerobic Exercise and Physical Activity Yes;Sedentary   Intervention While in program, learn and follow the exercise prescription taught. Start at a low level workload and increase workload after able to maintain previous level for 30 minutes. Increase time before increasing intensity.   Intervention Provide exercise education and an individualized exercise prescription that will provide continued progressive overload as per policy without signs/symptoms of physical distress.   Quit Smoking Yes   Number of packs per day 3 to 4 cigarettes a day   Intervention Utilize your health care professional team to help with smoking cessation while in the program. Your doctor can prescribe medications to aid in cessation. The program can provide information and counseling as needed.   Take Less Medication Yes   Intervention Learn your risk factors and begin the lifestyle modifications for risk factor control during your time in the program.   Diabetes No   Hypertension Yes   Goal Participant will see blood pressure controlled within the values of 140/55m/Hg or within value directed by their physician.   Intervention Provide nutrition & aerobic exercise along with prescribed medications to achieve BP 140/90 or less.   Lipids Yes   Goal Cholesterol controlled with medications as prescribed, with individualized exercise RX and with personalized nutrition plan. Value goals: LDL < 777m HDL > 4068mParticipant states understanding of desired cholesterol values and following prescriptions.   Intervention Provide nutrition & aerobic exercise along with prescribed medications to achieve LDL <95m75mDL >40mg48m    Personal Goals and Risk Factors Review:      Goals and Risk Factor Review      10/19/14 1201 10/21/14 0937 11/02/14 0929 11/02/14 1200     Increase Aerobic Exercise and Physical Activity   Goals Progress/Improvement seen  Yes Yes Yes     Comments Mr. ParkeAldousrheumatoid arthritis which is a stressor and makes things harder for him incl mowing his yard. Was able to mow 1/2 of it without stopping which is better [name] iWille Glasermproving on all of the exercise equipment and is up to 2.7mph 69mthe treadmill. He is very compliant with exercise increases and he is encouraged by all the progress he has made so far on the equipment.  Making significant gains on equipment, however, still experiencing some leg pain due to medications.     Quit Smoking   Goals Progress/Improvement seen    Yes;No    Comments    [name] continues to smoke 1-3 cigarettes a day. He is not ready to quit yet. We did review options in the community when he is ready to quit. Introduced QuitSmLobbyistm.    Hypertension   Goal --  Mr. [place] pressure is well controlled.   Participant will see blood pressure controlled within the values of 140/90mm/H63m within value directed by their physician. Participant will see blood pressure controlled within the values of 140/90mm/Hg72mwithin value directed by their physician.    Progress seen toward goals    Yes    Comments    [name] is aware of his BP readings and is maintianing good numbers. He is watching sodium intake and eating as healthy as he can.    Abnormal Lipids   Goal   Cholesterol controlled with medications as prescribed, with individualized exercise RX and with personalized nutrition plan. Value goals: LDL < 95mg, HD50m40mg. Par23mpant states understanding of desired cholesterol values and following  prescriptions.  medication side effects, still monitoring blood panel for changies Cholesterol controlled with medications as prescribed, with individualized exercise RX  and with personalized nutrition plan. Value goals: LDL < 6m, HDL > 469m Participant states understanding of desired cholesterol values and following prescriptions.    Progress seen towards goals    Unknown    Comments    JoWille Glaseras not had a labs recently. He does not like taking the medication for cholesterol control as it causes muscle aches off and on. His doctor is aware of the symptoms and has made several changes in the medication . [name] continues to have the muscle soreness and is living with it. The soreness is not consistent.       Personal Goals Discharge (Final Personal Goals and Risk Factors Review):      Goals and Risk Factor Review - 11/02/14 1200    Quit Smoking   Goals Progress/Improvement seen Yes;No   Comments [name] continues to smoke 1-3 cigarettes a day. He is not ready to quit yet. We did review options in the community when he is ready to quit. Introduced QuLobbyisto him.   Hypertension   Goal Participant will see blood pressure controlled within the values of 140/9046mg or within value directed by their physician.   Progress seen toward goals Yes   Comments [name] is aware of his BP readings and is maintianing good numbers. He is watching sodium intake and eating as healthy as he can.   Abnormal Lipids   Goal Cholesterol controlled with medications as prescribed, with individualized exercise RX and with personalized nutrition plan. Value goals: LDL < 65m64mDL > 40mg63mrticipant states understanding of desired cholesterol values and following prescriptions.   Progress seen towards goals Unknown   Comments [name] hWille Glasernot had a labs recently. He does not like taking the medication for cholesterol control as it causes muscle aches off and on. His doctor is aware of the symptoms and has made several changes in the medication . [name] continues to have the muscle soreness and is living with it. The soreness is not consistent.       Comments: 30 day review. Continue with  ITP.

## 2014-11-14 NOTE — Progress Notes (Signed)
Daily Session Note  Patient Details  Name: Martin Adkins MRN: [id number] Date of Birth: 14-Feb-1945 Referring Provider:  Corey Skains, MD  Encounter Date: 11/14/2014  Check In:     Session Check In - 11/14/14 0843    Check-In   Staff Present Candiss Norse MS, ACSM CEP Exercise Physiologist;Susanne Bice RN, BSN, CCRP;Tanush Drees Alfonso Patten, ACSM CEP Exercise Physiologist   ER physicians immediately available to respond to emergencies See telemetry face sheet for immediately available ER MD   Medication changes reported     No   Fall or balance concerns reported    No   Warm-up and Cool-down Performed on first and last piece of equipment   VAD Patient? No   Pain Assessment   Currently in Pain? No/denies   Multiple Pain Sites No         Goals Met:  Independence with exercise equipment Exercise tolerated well No report of cardiac concerns or symptoms Strength training completed today  Goals Unmet:  Not Applicable  Goals Comments: Patient completed exercise prescription and all exercise goals during rehab session. The exercise was tolerated well and the patient is progressing in the program.     Dr. Emily Filbert is Medical Director for [facility] and [facility].

## 2014-11-16 ENCOUNTER — Other Ambulatory Visit: Payer: Self-pay | Admitting: *Deleted

## 2014-11-16 DIAGNOSIS — I2119 ST elevation (STEMI) myocardial infarction involving other coronary artery of inferior wall: Secondary | ICD-10-CM | POA: Diagnosis not present

## 2014-11-16 NOTE — Progress Notes (Signed)
Daily Session Note  Patient Details  Name: Martin Adkins MRN: [id number] Date of Birth: 09/10/1945 Referring Provider:  Kowalski, Bruce J, MD  Encounter Date: 11/16/2014  Check In:     Session Check In - 11/16/14 0839    Check-In   Staff Present Susanne Bice RN, BSN, CCRP;Steven Way BS, ACSM EP-C, Exercise Physiologist;Renee MacMillan MS, ACSM CEP Exercise Physiologist   ER physicians immediately available to respond to emergencies See telemetry face sheet for immediately available ER MD   Medication changes reported     No   Fall or balance concerns reported    No   Warm-up and Cool-down Performed on first and last piece of equipment   VAD Patient? No   Pain Assessment   Currently in Pain? No/denies           Exercise Prescription Changes - 11/16/14 0800    Exercise Review   Progression Yes   Response to Exercise   Symptoms None   Comments Discussed adding one day a week of exercise at home in addition to the three days a week at [facility]. Explained that 150 minutes a week of exercise is the goal for combating and managing chronic health conditions. This volume of exercise is also proven to give other health benefits.    Frequency Add 1 additional day to program exercise sessions.   Duration Progress to 50 minutes of aerobic without signs/symptoms of physical distress   Intensity Rest + 30   Progression Continue progressive overload as per policy without signs/symptoms or physical distress.   Resistance Training   Training Prescription Yes   Weight 3   Reps 10-15   Interval Training   Interval Training Yes   Equipment Recumbant Bike;REL-XR   Comments HIIT was explained to the patient who wanted to begin with it on the RB.  Stated full understanding and demonstrated well.   Treadmill   MPH 3   Grade 1   Minutes 20   Recumbant Bike   Level 12   RPM 45   Watts 80   Minutes 20   NuStep   Level 6  T5   Watts 40   Minutes 20   Recumbant Elliptical   Level  3  BioStep   Watts 30   Minutes 20   REL-XR   Level 6   Watts 65   Minutes 15      Goals Met:  Proper associated with RPD/PD & O2 Sat Exercise tolerated well No report of cardiac concerns or symptoms Strength training completed today  Goals Unmet:  Not Applicable  Goals Comments:   Dr. Mark Miller is Medical Director for [facility] and [facility]. 

## 2014-11-16 NOTE — Progress Notes (Signed)
Cardiac Individual Treatment Plan  Patient Details  Name: Martin Adkins MRN: [id number] Date of Birth: 09-15-1945 Referring Provider:  Corey Skains, MD  Initial Encounter Date: 09/26/2014    Visit Diagnosis: ST elevation myocardial infarction (STEMI) involving other coronary artery of inferior wall ([place])  Patient's Home Medications on Admission:  Current outpatient prescriptions:  .  amLODipine (NORVASC) 5 MG tablet, 10 mg daily. , Disp: , Rfl:  .  aspirin 81 MG tablet, Take 1 tablet by mouth daily., Disp: , Rfl:  .  aspirin EC 81 MG tablet, Take by mouth., Disp: , Rfl:  .  atorvastatin (LIPITOR) 80 MG tablet, TAKE 1 TABLET (80 MG TOTAL) BY MOUTH ONCE DAILY., Disp: , Rfl: 11 .  benazepril (LOTENSIN) 20 MG tablet, Take 1 tablet by mouth daily., Disp: , Rfl:  .  chlorthalidone (HYGROTON) 25 MG tablet, Take 1 tablet by mouth daily., Disp: , Rfl:  .  fluticasone (FLONASE) 50 MCG/ACT nasal spray, , Disp: , Rfl:  .  Heat Wraps (HEAT THERAPY PATCHES) MISC, , Disp: , Rfl:  .  hydroxychloroquine (PLAQUENIL) 200 MG tablet, , Disp: , Rfl:  .  isosorbide mononitrate (IMDUR) 30 MG 24 hr tablet, , Disp: , Rfl:  .  LUBRICANT EYE DROPS 0.4-0.3 % SOLN, , Disp: , Rfl:  .  metoprolol succinate (TOPROL-XL) 100 MG 24 hr tablet, , Disp: , Rfl:  .  mirabegron ER (MYRBETRIQ) 25 MG TB24 tablet, Take 1 tablet (25 mg total) by mouth daily., Disp: 30 tablet, Rfl: 0 .  Naphazoline-Polyethyl Glycol 0.012-0.2 % SOLN, Place 1 drop into both eyes daily., Disp: , Rfl:  .  Neomycin-Bacitracin-Polymyxin (HCA TRIPLE ANTIBIOTIC OINTMENT EX), , Disp: , Rfl:  .  pantoprazole (PROTONIX) 40 MG tablet, , Disp: , Rfl:  .  Polyvinyl Alcohol (LUBRICANT DROPS OP), Place 1 drop into both eyes daily., Disp: , Rfl:  .  potassium chloride SA (K-DUR,KLOR-CON) 20 MEQ tablet, , Disp: , Rfl:  .  valsartan (DIOVAN) 160 MG tablet, Take 160 mg by mouth daily., Disp: , Rfl:  .  warfarin (COUMADIN) 2.5 MG tablet, 2.5 mg. 5 mg one day  alternate with 7 mg next day, Disp: , Rfl:  .  warfarin (COUMADIN) 5 MG tablet, TAKE 1 TABLET (5 MG TOTAL) BY MOUTH ONCE DAILY., Disp: , Rfl: 5  Past Medical History: Past Medical History  Diagnosis Date  . Hyperlipemia   . RA (rheumatoid arthritis) ([place])   . OSA (obstructive sleep apnea)   . PVD (peripheral vascular disease) ([place])   . GERD (gastroesophageal reflux disease)   . Osteoarthritis   . NSTEMI (non-ST elevated myocardial infarction) ([place])   . Hyperplastic polyps of stomach   . Barrett's esophagus   . Diabetes ([place])   . DVT (deep venous thrombosis) ([place])   . Abdominal aortic aneurysm without rupture ([place])   . CAD (coronary artery disease)   . SOB (shortness of breath)   . CHF (congestive heart failure) ([place])   . Hypertension   . Arthritis   . BPH (benign prostatic hyperplasia)   . AAA (abdominal aortic aneurysm) without rupture ([place]) 04/05/2014  . Benign essential HTN 05/02/2014  . Arteriosclerosis of coronary artery 04/05/2014    Overview:  Sp cabg with lima to lad svg to d1 om1 rca 2004 with occluded svg to rca and d1 and pci stetn of om1 graft 2015   . Chronic systolic heart failure ([place]) 04/19/2014    Overview:  With segmental LV systolic dysfunction  ejection fraction of 35%   . Antepartum deep phlebothrombosis ([place]) 07/28/2014  . Acute non-ST elevation myocardial infarction (NSTEMI) ([place]) 08/15/2014  . Myocardial infarction ([place]) 07/30/2014    Tobacco Use: History  Smoking status  . Current Some Day Smoker -- 0.25 packs/day for 20 years  . Types: Cigarettes  Smokeless tobacco  . Not on file    Labs: Recent Review Flowsheet Data    Labs for ITP Cardiac and Pulmonary Rehab Latest Ref Rng 05/03/2013   Cholestrol 0-200 mg/dL 196   LDLCALC 0-100 mg/dL 121(H)   HDL 40-60 mg/dL 42   Trlycerides 0-200 mg/dL 164       Exercise Target Goals:    Exercise Program Goal: Individual exercise prescription set with THRR, safety & activity barriers. Participant  demonstrates ability to understand and report RPE using BORG scale, to self-measure pulse accurately, and to acknowledge the importance of the exercise prescription.  Exercise Prescription Goal: Starting with aerobic activity 30 plus minutes a day, 3 days per week for initial exercise prescription. Provide home exercise prescription and guidelines that participant acknowledges understanding prior to discharge.  Activity Barriers & Risk Stratification:     Activity Barriers & Risk Stratification - 09/26/14 1158    Activity Barriers & Risk Stratification   Activity Barriers Arthritis   Risk Stratification High      6 Minute Walk:     6 Minute Walk      09/26/14 1259       6 Minute Walk   Phase Initial     Distance 1330 feet     Walk Time 6 minutes     Resting HR 74 bpm     Resting BP 112/78 mmHg     Max Ex. HR 89 bpm     Max Ex. BP 130/70 mmHg     RPE 11     Symptoms No        Initial Exercise Prescription:     Initial Exercise Prescription - 09/26/14 1300    Date of Initial Exercise Prescription   Date 09/26/14   Treadmill   MPH 2.5   Grade 0   Minutes 15   Bike   Level 1   Minutes 15   Recumbant Bike   Level 2   Watts 30   Minutes 15   NuStep   Level 3   Watts 40   Minutes 15   Arm Ergometer   Level 1   Watts 10   Minutes 15   Arm/Foot Ergometer   Level 1   Watts 15   Minutes 15   Cybex   Level 2   RPM 20   Minutes 15   Recumbant Elliptical   Level 2   RPM 40   Watts 20   Minutes 15   Elliptical   Level 1   Speed 2.5   Minutes 5   REL-XR   Level 3   Watts 40   Minutes 15   Prescription Details   Frequency (times per week) 3   Duration Progress to 30 minutes of continuous aerobic without signs/symptoms of physical distress   Intensity   THRR REST +  30   Ratings of Perceived Exertion 11-13   Progression Continue progressive overload as per policy without signs/symptoms or physical distress.   Resistance Training   Training  Prescription Yes   Weight 2   Reps 10-12      Exercise Prescription Changes:     Exercise Prescription Changes  10/11/14 0700 10/17/14 0800 10/26/14 0800 11/02/14 0800 11/08/14 0600   Exercise Review   Progression Yes Yes Yes Yes Yes   Response to Exercise   Blood Pressure (Admit) 128/70 mmHg 128/70 mmHg   130/78 mmHg   Blood Pressure (Exercise) 156/82 mmHg 156/82 mmHg   132/80 mmHg   Blood Pressure (Exit) 126/72 mmHg 126/72 mmHg   120/74 mmHg   Heart Rate (Admit) 74 bpm 74 bpm   81 bpm   Heart Rate (Exercise) 91 bpm 91 bpm   102 bpm   Heart Rate (Exit) 72 bpm 72 bpm   68 bpm   Rating of Perceived Exertion (Exercise) '11 11   13   ' Symptoms None None  None None   Comments Reviewed individualized exercise prescription and made increases per departmental policy. Exercise increases were discussed with the patient and they were able to perform the new work loads without issue (no signs or symptoms).  Reviewed individualized exercise prescription and made increases per departmental policy. Exercise increases were discussed with the patient and they were able to perform the new work loads without issue (no signs or symptoms). [name] is doing well in class and is enjoying the program.   Talked with [name] about his current workloads and made increases in level on the bike and added an incline on the treadmill. [name] is encouaged by the progress he is making and appreciates the exercise challenges.  Discussed adding one day a week of exercise at home in addition to the three days a week at [facility]. Explained that 150 minutes a week of exercise is the goal for combating and managing chronic health conditions. This volume of exercise is also proven to give other health benefits.    Frequency     Add 1 additional day to program exercise sessions.   Duration Progress to 50 minutes of aerobic without signs/symptoms of physical distress Progress to 50 minutes of aerobic without signs/symptoms of physical  distress  Progress to 50 minutes of aerobic without signs/symptoms of physical distress Progress to 50 minutes of aerobic without signs/symptoms of physical distress   Intensity Rest + 30 Rest + 30  Rest + 30 Rest + 30   Progression Continue progressive overload as per policy without signs/symptoms or physical distress. Continue progressive overload as per policy without signs/symptoms or physical distress.  Continue progressive overload as per policy without signs/symptoms or physical distress. Continue progressive overload as per policy without signs/symptoms or physical distress.   Resistance Training   Training Prescription Yes Yes  Yes Yes   Weight '3 3  3 3   ' Reps 10-15 10-15  10-15 10-15   Interval Training   Interval Training No No Yes Yes Yes   Equipment   Recumbant Bike Recumbant Bike Recumbant Bike   Comments   HIIT was explained to the patient who wanted to begin with it on the [place].  Stated full understanding and demonstrated well. HIIT was explained to the patient who wanted to begin with it on the [place].  Stated full understanding and demonstrated well. HIIT was explained to the patient who wanted to begin with it on the [place].  Stated full understanding and demonstrated well.   Treadmill   MPH 2.7 2.'7  3 3   ' Grade 0 0  1 1   Minutes '20 20  20 20   ' Recumbant Bike   Level    12 12   RPM    45 45   Watts  80 80   Minutes    20 20   NuStep   Level     6  T5   Watts     40   Minutes     20   Recumbant Elliptical   Level 3  BioStep 3  BioStep  3  BioStep 3  BioStep   Watts '30 30  30 30   ' Minutes '20 20  20 20   ' REL-XR   Level '3 6  6 6   ' Watts 60 65  65 65   Minutes '15 15  15 15     ' 11/16/14 0800           Exercise Review   Progression Yes       Response to Exercise   Symptoms None       Comments Discussed adding one day a week of exercise at home in addition to the three days a week at [facility]. Explained that 150 minutes a week of exercise is the goal for combating  and managing chronic health conditions. This volume of exercise is also proven to give other health benefits.        Frequency Add 1 additional day to program exercise sessions.       Duration Progress to 50 minutes of aerobic without signs/symptoms of physical distress       Intensity Rest + 30       Progression Continue progressive overload as per policy without signs/symptoms or physical distress.       Resistance Training   Training Prescription Yes       Weight 3       Reps 10-15       Interval Training   Interval Training Yes       Equipment Recumbant [id number]       Comments HIIT was explained to the patient who wanted to begin with it on the [place].  Stated full understanding and demonstrated well.       Treadmill   MPH 3       Grade 1       Minutes 20       Recumbant Bike   Level 12       RPM 45       Watts 80       Minutes 20       NuStep   Level 6  T5       Watts 40       Minutes 20       Recumbant Elliptical   Level 3  BioStep       Watts 30       Minutes 20       REL-XR   Level 6       Watts 18       Minutes 15          Discharge Exercise Prescription (Final Exercise Prescription Changes):     Exercise Prescription Changes - 11/16/14 0800    Exercise Review   Progression Yes   Response to Exercise   Symptoms None   Comments Discussed adding one day a week of exercise at home in addition to the three days a week at [facility] [place]. Explained that 150 minutes a week of exercise is the goal for combating and managing chronic health conditions. This volume of exercise is also proven to give other health benefits.    Frequency Add 1 additional day to program exercise sessions.   Duration Progress to  50 minutes of aerobic without signs/symptoms of physical distress   Intensity Rest + 30   Progression Continue progressive overload as per policy without signs/symptoms or physical distress.   Resistance Training   Training Prescription Yes   Weight 3   Reps 10-15    Interval Training   Interval Training Yes   Equipment Recumbant [id number]   Comments HIIT was explained to the patient who wanted to begin with it on the [place].  Stated full understanding and demonstrated well.   Treadmill   MPH 3   Grade 1   Minutes 20   Recumbant Bike   Level 12   RPM 45   Watts 80   Minutes 20   NuStep   Level 6  T5   Watts 40   Minutes 20   Recumbant Elliptical   Level 3  BioStep   Watts 30   Minutes 20   REL-XR   Level 6   Watts 65   Minutes 15      Nutrition:  Target Goals: Understanding of nutrition guidelines, daily intake of sodium <1561m, cholesterol <2070m calories 30% from fat and 7% or less from saturated fats, daily to have 5 or more servings of fruits and vegetables.  Biometrics:     Pre Biometrics - 09/26/14 1307    Pre Biometrics   Height 6' (1.829 m)   Weight 211 lb 9.6 oz (95.981 kg)   Waist Circumference 42.75 inches   Hip Circumference 41 inches   Waist to Hip Ratio 1.04 %   BMI (Calculated) 28.8       Nutrition Therapy Plan and Nutrition Goals:   Nutrition Discharge: Rate Your Plate Scores:     Rate Your Plate - 1199/37/1679678  Rate Your Plate Scores   Post Score 73   Post Score % 81 %      Nutrition Goals Re-Evaluation:     Nutrition Goals Re-Evaluation      10/12/14 1852 11/11/14 1123         Personal Goal #1 Re-Evaluation   Personal Goal #1 I asked [email or web address] and he said he prefers not to meet individuallly with the Cardiac Rehab Registered Dietician since he feels he eats healthy now.        Goal Progress Seen Yes Yes      Comments  He feels the Cardiac Rehab registered dietician answered all his questions in her lecture/education session so he wanted usKoreao cancel his individual appt with her. I sent CoKarolee StampsRD an email to cancel appt.         Psychosocial: Target Goals: Acknowledge presence or absence of depression, maximize coping skills, provide positive support system.  Participant is able to verbalize types and ability to use techniques and skills needed for reducing stress and depression.  Initial Review & Psychosocial Screening:     Initial Psych Review & Screening - 09/26/14 1201    Family Dynamics   Good Support System? Yes   Comments Lives alone  separated for over 4 years      Quality of Life Scores:     Quality of Life - 11/16/14 1757    Quality of Life Scores   Health/Function Post 24 %   Health/Function % Change 1 %   Socioeconomic Post 26.57 %   Socioeconomic % Change -8 %   Psych/Spiritual Post 24.86 %   Psych/Spiritual % Change -3 %   Family Post 27.3 %   Family % Change  14 %   GLOBAL Post 25.27 %   GLOBAL % Change 2 %      PHQ-9:     Recent Review Flowsheet Data    Depression screen [facility] 2/9 11/16/2014 09/26/2014   Decreased Interest 2 1   Down, Depressed, Hopeless 0 0   PHQ - 2 Score 2 1   Altered sleeping 0 1   Tired, decreased energy 2 1   Change in appetite 0 0   Feeling bad or failure about yourself  0 0   Trouble concentrating - 0   Moving slowly or fidgety/restless 0 0   Suicidal thoughts 0 0   PHQ-9 Score 4 3   Difficult doing work/chores Somewhat difficult Somewhat difficult      Psychosocial Evaluation and Intervention:     Psychosocial Evaluation - 10/03/14 1012    Psychosocial Evaluation & Interventions   Interventions Stress management education;Relaxation education;Encouraged to exercise with the program and follow exercise prescription   Comments Counselor met today with Mr. Kunath for initial psychosocial evaluation.  He is a 69 year old who had a heart attack in July and is in this program for recovery.  Mr. Minehart has a strong support system with an adult daughter and a brother who live close by.  He reports he sleeps well and has a good appetite.  He denies a history of depression or anxiety or any current symptoms.  He has minimal stress in his life and states he is typically in a positive mood.   Mr. Ditullio goals are to increase his stamina and strength and "just to feel better" in this program.  He plans work out through a [employer] program following this program.     Continued Psychosocial Services Needed Yes  Mr. Faivre will benefit from the psychoeducational components of this program and consistent exercise.        Psychosocial Re-Evaluation:     Psychosocial Re-Evaluation      10/19/14 1200           Psychosocial Re-Evaluation   Interventions Stress management education;Encouraged to attend Cardiac Rehabilitation for the exercise       Comments Mr. Schiller has rheumatoid arthritis which is a stressor and makes things harder for him incl mowing his yard. Was able to mow 1/2 of it without stopping which is better.           Vocational Rehabilitation: Provide vocational rehab assistance to qualifying candidates.   Vocational Rehab Evaluation & Intervention:     Vocational Rehab - 09/26/14 1158    Initial Vocational Rehab Evaluation & Intervention   Assessment shows need for Vocational Rehabilitation No      Education: Education Goals: Education classes will be provided on a weekly basis, covering required topics. Participant will state understanding/return demonstration of topics presented.  Learning Barriers/Preferences:     Learning Barriers/Preferences - 09/26/14 1158    Learning Barriers/Preferences   Learning Barriers None   Learning Preferences None      Education Topics: General Nutrition Guidelines/Fats and Fiber: -Group instruction provided by verbal, written material, models and posters to present the general guidelines for heart healthy nutrition. Gives an explanation and review of dietary fats and fiber.          Cardiac Rehab from 11/14/2014 in [facility]   Date  10/17/14   Educator  PI   Instruction Review Code  2- meets goals/outcomes      Controlling Sodium/Reading Food Labels: -Group verbal and written  material  supporting the discussion of sodium use in heart healthy nutrition. Review and explanation with models, verbal and written materials for utilization of the food label.      Cardiac Rehab from 11/14/2014 in [facility] Cardiac Rehab   Date  10/31/14   Educator  CR   Instruction Review Code  2- meets goals/outcomes      Exercise Physiology & Risk Factors: - Group verbal and written instruction with models to review the exercise physiology of the cardiovascular system and associated critical values. Details cardiovascular disease risk factors and the goals associated with each risk factor.      Cardiac Rehab from 11/14/2014 in [facility] Cardiac Rehab   Date  11/09/14   Educator  RM   Instruction Review Code  2- meets goals/outcomes      Aerobic Exercise & Resistance Training: - Gives group verbal and written discussion on the health impact of inactivity. On the components of aerobic and resistive training programs and the benefits of this training and how to safely progress through these programs.      Cardiac Rehab from 11/14/2014 in [facility] Cardiac Rehab   Date  11/14/14   Educator  RM   Instruction Review Code  2- meets goals/outcomes      Flexibility, Balance, General Exercise Guidelines: - Provides group verbal and written instruction on the benefits of flexibility and balance training programs. Provides general exercise guidelines with specific guidelines to those with heart or lung disease. Demonstration and skill practice provided.   Stress Management: - Provides group verbal and written instruction about the health risks of elevated stress, cause of high stress, and healthy ways to reduce stress.   Depression: - Provides group verbal and written instruction on the correlation between heart/lung disease and depressed mood, treatment options, and the stigmas associated with seeking treatment.      Cardiac Rehab from 11/14/2014 in [facility] Cardiac Rehab   Date  11/02/14   Educator  [facility]    Instruction Review Code  2- meets goals/outcomes      Anatomy & Physiology of the Heart: - Group verbal and written instruction and models provide basic cardiac anatomy and physiology, with the coronary electrical and arterial systems. Review of: AMI, Angina, Valve disease, Heart Failure, Cardiac Arrhythmia, Pacemakers, and the ICD.      Cardiac Rehab from 11/14/2014 in [facility] Cardiac Rehab   Date  10/03/14   Educator  SB   Instruction Review Code  2- meets goals/outcomes      Cardiac Procedures: - Group verbal and written instruction and models to describe the testing methods done to diagnose heart disease. Reviews the outcomes of the test results. Describes the treatment choices: Medical Management, Angioplasty, or Coronary Bypass Surgery.      Cardiac Rehab from 11/14/2014 in [facility] Cardiac Rehab   Date  10/24/14   Educator  SB   Instruction Review Code  2- meets goals/outcomes      Cardiac Medications: - Group verbal and written instruction to review commonly prescribed medications for heart disease. Reviews the medication, class of the drug, and side effects. Includes the steps to properly store meds and maintain the prescription regimen.      Cardiac Rehab from 11/14/2014 in [facility] Cardiac Rehab   Date  11/07/14   Educator  SB   Instruction Review Code  2- meets goals/outcomes      Go Sex-Intimacy & Heart Disease, Get SMART - Goal Setting: - Group verbal and written instruction through game format to  discuss heart disease and the return to sexual intimacy. Provides group verbal and written material to discuss and apply goal setting through the application of the S.M.A.R.T. Method.      Cardiac Rehab from 11/14/2014 in [facility] Cardiac Rehab   Date  10/24/14   Educator  SB   Instruction Review Code  2- meets goals/outcomes      Other Matters of the Heart: - Provides group verbal, written materials and models to describe Heart Failure, Angina, Valve Disease, and Diabetes in the realm  of heart disease. Includes description of the disease process and treatment options available to the cardiac patient.      Cardiac Rehab from 11/14/2014 in [facility] Cardiac Rehab   Date  10/12/14   Educator  Scissors   Instruction Review Code  2- meets goals/outcomes      Exercise & Equipment Safety: - Individual verbal instruction and demonstration of equipment use and safety with use of the equipment.      Cardiac Rehab from 11/14/2014 in [facility] Cardiac Rehab   Date  09/26/14   Educator  SB   Instruction Review Code  2- meets goals/outcomes      Infection Prevention: - Provides verbal and written material to individual with discussion of infection control including proper hand washing and proper equipment cleaning during exercise session.      Cardiac Rehab from 11/14/2014 in [facility] Cardiac Rehab   Date  09/26/14   Educator  SB   Instruction Review Code  2- meets goals/outcomes      Falls Prevention: - Provides verbal and written material to individual with discussion of falls prevention and safety.      Cardiac Rehab from 11/14/2014 in [facility] Cardiac Rehab   Date  09/26/14   Educator  SB   Instruction Review Code  2- meets goals/outcomes      Diabetes: - Individual verbal and written instruction to review signs/symptoms of diabetes, desired ranges of glucose level fasting, after meals and with exercise. Advice that pre and post exercise glucose checks will be done for 3 sessions at entry of program.    Knowledge Questionnaire Score:     Knowledge Questionnaire Score - 11/16/14 1758    Knowledge Questionnaire Score   Post Score 25      Personal Goals and Risk Factors at Admission:     Personal Goals and Risk Factors at Admission - 09/26/14 1202    Personal Goals and Risk Factors on Admission   Increase Aerobic Exercise and Physical Activity Yes;Sedentary   Intervention While in program, learn and follow the exercise prescription taught. Start at a low level workload and increase  workload after able to maintain previous level for 30 minutes. Increase time before increasing intensity.   Intervention Provide exercise education and an individualized exercise prescription that will provide continued progressive overload as per policy without signs/symptoms of physical distress.   Quit Smoking Yes   Number of packs per day 3 to 4 cigarettes a day   Intervention Utilize your health care professional team to help with smoking cessation while in the program. Your doctor can prescribe medications to aid in cessation. The program can provide information and counseling as needed.   Take Less Medication Yes   Intervention Learn your risk factors and begin the lifestyle modifications for risk factor control during your time in the program.   Diabetes No   Hypertension Yes   Goal Participant will see blood pressure controlled within the values of 140/70m/Hg or within  value directed by their physician.   Intervention Provide nutrition & aerobic exercise along with prescribed medications to achieve BP 140/90 or less.   Lipids Yes   Goal Cholesterol controlled with medications as prescribed, with individualized exercise RX and with personalized nutrition plan. Value goals: LDL < 19m, HDL > 439m Participant states understanding of desired cholesterol values and following prescriptions.   Intervention Provide nutrition & aerobic exercise along with prescribed medications to achieve LDL <7017mHDL >37m64m    Personal Goals and Risk Factors Review:      Goals and Risk Factor Review      10/19/14 1201 10/21/14 0937 11/02/14 0929 11/02/14 1200     Increase Aerobic Exercise and Physical Activity   Goals Progress/Improvement seen  Yes Yes Yes     Comments Mr. ParkTooker rheumatoid arthritis which is a stressor and makes things harder for him incl mowing his yard. Was able to mow 1/2 of it without stopping which is better [name] Wille Glaserimproving on all of the exercise equipment and is up to  2.7mph74m the treadmill. He is very compliant with exercise increases and he is encouraged by all the progress he has made so far on the equipment.  Making significant gains on equipment, however, still experiencing some leg pain due to medications.     Quit Smoking   Goals Progress/Improvement seen    Yes;No    Comments    [name] continues to smoke 1-3 cigarettes a day. He is not ready to quit yet. We did review options in the community when he is ready to quit. Introduced QuitSLobbyistim.    Hypertension   Goal --  Mr. ParkeWinchelld pressure is well controlled.   Participant will see blood pressure controlled within the values of 140/90mm/61mr within value directed by their physician. Participant will see blood pressure controlled within the values of 140/90mm/H45m within value directed by their physician.    Progress seen toward goals    Yes    Comments    [name] is aware of his BP readings and is maintianing good numbers. He is watching sodium intake and eating as healthy as he can.    Abnormal Lipids   Goal   Cholesterol controlled with medications as prescribed, with individualized exercise RX and with personalized nutrition plan. Value goals: LDL < 70mg, H27m 37mg. Pa91mipant states understanding of desired cholesterol values and following prescriptions.  medication side effects, still monitoring blood panel for changies Cholesterol controlled with medications as prescribed, with individualized exercise RX and with personalized nutrition plan. Value goals: LDL < 70mg, HDL23m0mg. Part77mant states understanding of desired cholesterol values and following prescriptions.    Progress seen towards goals    Unknown    Comments    [name] has notWille Glaserd a labs recently. He does not like taking the medication for cholesterol control as it causes muscle aches off and on. His doctor is aware of the symptoms and has made several changes in the medication . [name] continues to have the muscle soreness and is  living with it. The soreness is not consistent.       Personal Goals Discharge (Final Personal Goals and Risk Factors Review):      Goals and Risk Factor Review - 11/02/14 1200    Quit Smoking   Goals Progress/Improvement seen Yes;No   Comments [name] continues to smoke 1-3 cigarettes a day. He is not ready to quit yet. We did  review options in the community when he is ready to quit. Introduced Lobbyist to him.   Hypertension   Goal Participant will see blood pressure controlled within the values of 140/109m/Hg or within value directed by their physician.   Progress seen toward goals Yes   Comments [name] is aware of his BP readings and is maintianing good numbers. He is watching sodium intake and eating as healthy as he can.   Abnormal Lipids   Goal Cholesterol controlled with medications as prescribed, with individualized exercise RX and with personalized nutrition plan. Value goals: LDL < 75m HDL > 4030mParticipant states understanding of desired cholesterol values and following prescriptions.   Progress seen towards goals Unknown   Comments JoeWille Glasers not had a labs recently. He does not like taking the medication for cholesterol control as it causes muscle aches off and on. His doctor is aware of the symptoms and has made several changes in the medication . [name] continues to have the muscle soreness and is living with it. The soreness is not consistent.       Comments:

## 2014-11-18 ENCOUNTER — Encounter: Payer: Medicare PPO | Admitting: *Deleted

## 2014-11-18 VITALS — Ht 72.0 in | Wt 211.0 lb

## 2014-11-18 DIAGNOSIS — I2119 ST elevation (STEMI) myocardial infarction involving other coronary artery of inferior wall: Secondary | ICD-10-CM | POA: Diagnosis not present

## 2014-11-18 NOTE — Progress Notes (Signed)
Daily Session Note  Patient Details  Name: Martin Adkins MRN: [id number] Date of Birth: 25-Sep-1945 Referring Provider:  Madelyn Brunner, MD  Encounter Date: 11/18/2014  Check In:     Session Check In - 11/18/14 0819    Check-In   Staff Present Candiss Norse MS, ACSM CEP Exercise Physiologist;Carroll Enterkin RN, BSN;Susanne Bice RN, BSN, [place]   ER physicians immediately available to respond to emergencies See telemetry face sheet for immediately available ER MD   Medication changes reported     No   Fall or balance concerns reported    No   Warm-up and Cool-down Performed on first and last piece of equipment   VAD Patient? No   Pain Assessment   Currently in Pain? No/denies   Multiple Pain Sites No           Exercise Prescription Changes - 11/18/14 0800    Exercise Review   Progression Yes   Response to Exercise   Symptoms None   Comments Patient is approaching graduation of the program and home exercise plans were discussed. Details of the patient's exercise prescription and what they need to do in order to continue the prescription and progress with exercise were outlined and the patient verbalized understanding. The patient plans to complete all exercise at the [facility] fitness center. An orientation appointment has been made for Nov. 21st.    Duration Progress to 50 minutes of aerobic without signs/symptoms of physical distress   Intensity Rest + 30   Progression Continue progressive overload as per policy without signs/symptoms or physical distress.   Resistance Training   Training Prescription Yes   Weight 3   Reps 10-15   Interval Training   Interval Training Yes   Equipment Recumbant YHCW;CBJ-SE   Comments HIIT was explained to the patient who wanted to begin with it on the [place] [employer].  Stated full understanding and demonstrated well.   Treadmill   MPH 3   Grade 1   Minutes 20   Recumbant Bike   Level 12   RPM 45   Watts 80   Minutes 20   NuStep   Level 6   T5   Watts 40   Minutes 20   Recumbant Elliptical   Level 3  BioStep   Watts 30   Minutes 20   REL-XR   Level 6   Watts 65   Minutes 15   Home Exercise Plan   Plans to continue exercise at [place] with exercise equipment Exercise tolerated well Personal goals reviewed No report of cardiac concerns or symptoms Strength training completed today  Goals Unmet:  Not Applicable  Goals Comments: Post 6MW completed   Dr. Emily Filbert is Medical Director for [place] and [facility].

## 2014-11-21 ENCOUNTER — Encounter: Payer: Medicare PPO | Admitting: *Deleted

## 2014-11-21 DIAGNOSIS — I2119 ST elevation (STEMI) myocardial infarction involving other coronary artery of inferior wall: Secondary | ICD-10-CM | POA: Diagnosis not present

## 2014-11-21 NOTE — Progress Notes (Signed)
Daily Session Note  Patient Details  Name: Martin Adkins MRN: [id number] Date of Birth: September 11, 1945 Referring Provider:  Corey Skains, MD  Encounter Date: 11/21/2014  Check In:     Session Check In - 11/21/14 0843    Check-In   Staff Present Candiss Norse MS, ACSM CEP Exercise Physiologist;Susanne Bice RN, BSN, CCRP;Laron Angelini Alfonso Patten, ACSM CEP Exercise Physiologist   ER physicians immediately available to respond to emergencies See telemetry face sheet for immediately available ER MD   Medication changes reported     No   Fall or balance concerns reported    No   Warm-up and Cool-down Performed on first and last piece of equipment   VAD Patient? No   Pain Assessment   Currently in Pain? No/denies   Multiple Pain Sites No         Goals Met:  Independence with exercise equipment Exercise tolerated well No report of cardiac concerns or symptoms Strength training completed today  Goals Unmet:  Not Applicable  Goals Comments: Patient completed exercise prescription and all exercise goals during rehab session. The exercise was tolerated well and the patient is progressing in the program.     Dr. Emily Filbert is Medical Director for [place] and [facility].

## 2014-11-23 ENCOUNTER — Encounter: Payer: Medicare PPO | Admitting: *Deleted

## 2014-11-23 DIAGNOSIS — I2119 ST elevation (STEMI) myocardial infarction involving other coronary artery of inferior wall: Secondary | ICD-10-CM | POA: Diagnosis not present

## 2014-11-23 NOTE — Patient Instructions (Signed)
Discharge Instructions  Patient Details  Name: Martin Adkins MRN: [id number] Date of Birth: 1945-01-27 Referring Provider:  Madelyn Brunner, MD   Number of Visits: Almost done with 36 visits (35 completed 11/23/2014)  Reason for Discharge:  Patient reached a stable level of exercise. Patient independent in their exercise.  Smoking History:  History  Smoking status  . Current Some Day Smoker -- 0.25 packs/day for 20 years  . Types: Cigarettes  Smokeless tobacco  . 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 coronary artery of inferior wall [facility])  Initial Exercise Prescription:     Initial Exercise Prescription - 09/26/14 1300    Date of Initial Exercise Prescription   Date 09/26/14   Treadmill   MPH 2.5   Grade 0   Minutes 15   Bike   Level 1   Minutes 15   Recumbant Bike   Level 2   Watts 30   Minutes 15   NuStep   Level 3   Watts 40   Minutes 15   Arm Ergometer   Level 1   Watts 10   Minutes 15   Arm/Foot Ergometer   Level 1   Watts 15   Minutes 15   Cybex   Level 2   RPM 20   Minutes 15   Recumbant Elliptical   Level 2   RPM 40   Watts 20   Minutes 15   Elliptical   Level 1   Speed 2.5   Minutes 5   REL-XR   Level 3   Watts 40   Minutes 15   Prescription Details   Frequency (times per week) 3   Duration Progress to 30 minutes of continuous aerobic without signs/symptoms of physical distress   Intensity   THRR REST +  30   Ratings of Perceived Exertion 11-13   Progression Continue progressive overload as per policy without signs/symptoms or physical distress.   Resistance Training   Training Prescription Yes   Weight 2   Reps 10-12      Discharge Exercise Prescription (Final Exercise Prescription Changes):     Exercise Prescription Changes - 11/18/14 0800    Exercise Review   Progression Yes   Response to Exercise   Symptoms None   Comments Patient is approaching graduation  of the program and home exercise plans were discussed. Details of the patient's exercise prescription and what they need to do in order to continue the prescription and progress with exercise were outlined and the patient verbalized understanding. The patient plans to complete all exercise at the [facility] fitness center. An orientation appointment has been made for Nov. 21st.    Duration Progress to 50 minutes of aerobic without signs/symptoms of physical distress   Intensity Rest + 30   Progression Continue progressive overload as per policy without signs/symptoms or physical distress.   Resistance Training   Training Prescription Yes   Weight 3   Reps 10-15   Interval Training   Interval Training Yes   Equipment Recumbant [id number]   Comments HIIT was explained to the patient who wanted to begin with it on the [place].  Stated full understanding and demonstrated well.   Treadmill   MPH 3   Grade 1   Minutes 20   Recumbant Bike   Level 12   RPM 45   Watts 80   Minutes 20   NuStep   Level 6  T5  Watts 40   Minutes 20   Recumbant Elliptical   Level 3  BioStep   Watts 30   Minutes 20   REL-XR   Level 6   Watts 65   Minutes 15   Home Exercise Plan   Plans to continue exercise at [place]:     6 Minute Walk      09/26/14 1259 11/18/14 0820     6 Minute Walk   Phase Initial Discharge    Distance 1330 feet 1790 feet    Distance % Change  35 %    Walk Time 6 minutes 6 minutes    Resting HR 74 bpm 84 bpm    Resting BP 112/78 mmHg 118/80 mmHg    Max Ex. HR 89 bpm 102 bpm    Max Ex. BP 130/70 mmHg 124/80 mmHg    RPE 11 13    Symptoms No No       Quality of Life:     Quality of Life - 11/16/14 1757    Quality of Life Scores   Health/Function Post 24 %   Health/Function % Change 1 %   Socioeconomic Post 26.57 %   Socioeconomic % Change -8 %   Psych/Spiritual Post 24.86 %   Psych/Spiritual % Change -3 %   Family Post 27.3 %    Family % Change 14 %   GLOBAL Post 25.27 %   GLOBAL % Change 2 %      Personal Goals: Goals established at orientation with interventions provided to work toward goal.     Personal Goals and Risk Factors at Admission - 09/26/14 1202    Personal Goals and Risk Factors on Admission   Increase Aerobic Exercise and Physical Activity Yes;Sedentary   Intervention While in program, learn and follow the exercise prescription taught. Start at a low level workload and increase workload after able to maintain previous level for 30 minutes. Increase time before increasing intensity.   Intervention Provide exercise education and an individualized exercise prescription that will provide continued progressive overload as per policy without signs/symptoms of physical distress.   Quit Smoking Yes   Number of packs per day 3 to 4 cigarettes a day   Intervention Utilize your health care professional team to help with smoking cessation while in the program. Your doctor can prescribe medications to aid in cessation. The program can provide information and counseling as needed.   Take Less Medication Yes   Intervention Learn your risk factors and begin the lifestyle modifications for risk factor control during your time in the program.   Diabetes No   Hypertension Yes   Goal Participant will see blood pressure controlled within the values of 140/60mm/Hg or within value directed by their physician.   Intervention Provide nutrition & aerobic exercise along with prescribed medications to achieve BP 140/90 or less.   Lipids Yes   Goal Cholesterol controlled with medications as prescribed, with individualized exercise RX and with personalized nutrition plan. Value goals: LDL < 70mg , HDL > 40mg . Participant states understanding of desired cholesterol values and following prescriptions.   Intervention Provide nutrition & aerobic exercise along with prescribed medications to achieve LDL 70mg , HDL >40mg .        Personal Goals Discharge:     Goals and Risk Factor Review - 11/23/14 1034    Increase Aerobic Exercise and Physical Activity   Goals Progress/Improvement seen  Yes   Comments "Martin" Adkins will  exercise in [place] has already made him a follow up orientation appt for there.    Quit Smoking   Goals Progress/Improvement seen Yes   Comments HOpes to cut back on those 2 cigarettes a day. Martin reports he smokes one in the am after breakfast then one in the afternoon if he is stressed. I encouraged him to cut back on the afternoon one by thinking he wanted to see his granddaughter that he helps with alot graduate from college.    Hypertension   Progress seen toward goals Yes      Nutrition & Weight - Outcomes:     Pre Biometrics - 09/26/14 1307    Pre Biometrics   Height 6' (1.829 m)   Weight 211 lb 9.6 oz (95.981 kg)   Waist Circumference 42.75 inches   Hip Circumference 41 inches   Waist to Hip Ratio 1.04 %   BMI (Calculated) 28.8         Post Biometrics - 11/18/14 0820     Post  Biometrics   Height 6' (1.829 m)   Weight 211 lb (95.709 kg)   Waist Circumference 41.5 inches   Hip Circumference 40 inches   Waist to Hip Ratio 1.04 %   BMI (Calculated) 28.7      Nutrition:   Nutrition Discharge:     Rate Your Plate - [id number]    Rate Your Plate Scores   Post Score 73   Post Score % 81 %      Education Questionnaire Score:     Knowledge Questionnaire Score - 11/16/14 1758    Knowledge Questionnaire Score   Post Score 25      Goals reviewed with patient; copy given to patient.

## 2014-11-23 NOTE — Progress Notes (Signed)
Cardiac Individual Treatment Plan  Patient Details  Name: Martin Adkins MRN: [id number] Date of Birth: Mar 28, 1945 Referring Provider:  Madelyn Brunner, MD  Initial Encounter Date:  09/26/2014  Visit Diagnosis: ST elevation myocardial infarction (STEMI) involving other coronary artery of inferior wall ([place])  Patient's Home Medications on Admission:  Current outpatient prescriptions:  .  amLODipine (NORVASC) 5 MG tablet, 10 mg daily. , Disp: , Rfl:  .  aspirin 81 MG tablet, Take 1 tablet by mouth daily., Disp: , Rfl:  .  aspirin EC 81 MG tablet, Take by mouth., Disp: , Rfl:  .  atorvastatin (LIPITOR) 80 MG tablet, TAKE 1 TABLET (80 MG TOTAL) BY MOUTH ONCE DAILY., Disp: , Rfl: 11 .  benazepril (LOTENSIN) 20 MG tablet, Take 1 tablet by mouth daily., Disp: , Rfl:  .  chlorthalidone (HYGROTON) 25 MG tablet, Take 1 tablet by mouth daily., Disp: , Rfl:  .  fluticasone (FLONASE) 50 MCG/ACT nasal spray, , Disp: , Rfl:  .  Heat Wraps (HEAT THERAPY PATCHES) MISC, , Disp: , Rfl:  .  hydroxychloroquine (PLAQUENIL) 200 MG tablet, , Disp: , Rfl:  .  isosorbide mononitrate (IMDUR) 30 MG 24 hr tablet, , Disp: , Rfl:  .  LUBRICANT EYE DROPS 0.4-0.3 % SOLN, , Disp: , Rfl:  .  metoprolol succinate (TOPROL-XL) 100 MG 24 hr tablet, , Disp: , Rfl:  .  mirabegron ER (MYRBETRIQ) 25 MG TB24 tablet, Take 1 tablet (25 mg total) by mouth daily., Disp: 30 tablet, Rfl: 0 .  Naphazoline-Polyethyl Glycol 0.012-0.2 % SOLN, Place 1 drop into both eyes daily., Disp: , Rfl:  .  Neomycin-Bacitracin-Polymyxin (HCA TRIPLE ANTIBIOTIC OINTMENT EX), , Disp: , Rfl:  .  pantoprazole (PROTONIX) 40 MG tablet, , Disp: , Rfl:  .  Polyvinyl Alcohol (LUBRICANT DROPS OP), Place 1 drop into both eyes daily., Disp: , Rfl:  .  potassium chloride SA (K-DUR,KLOR-CON) 20 MEQ tablet, , Disp: , Rfl:  .  valsartan (DIOVAN) 160 MG tablet, Take 160 mg by mouth daily., Disp: , Rfl:  .  warfarin (COUMADIN) 2.5 MG tablet, 2.5 mg. 5 mg one day  alternate with 7 mg next day, Disp: , Rfl:  .  warfarin (COUMADIN) 5 MG tablet, TAKE 1 TABLET (5 MG TOTAL) BY MOUTH ONCE DAILY., Disp: , Rfl: 5  Past Medical History: Past Medical History  Diagnosis Date  . Hyperlipemia   . RA (rheumatoid arthritis) ([place])   . OSA (obstructive sleep apnea)   . PVD (peripheral vascular disease) ([place])   . GERD (gastroesophageal reflux disease)   . Osteoarthritis   . NSTEMI (non-ST elevated myocardial infarction) ([place])   . Hyperplastic polyps of stomach   . Barrett's esophagus   . Diabetes ([place])   . DVT (deep venous thrombosis) ([place])   . Abdominal aortic aneurysm without rupture ([place])   . CAD (coronary artery disease)   . SOB (shortness of breath)   . CHF (congestive heart failure) ([place])   . Hypertension   . Arthritis   . BPH (benign prostatic hyperplasia)   . AAA (abdominal aortic aneurysm) without rupture ([place]) 04/05/2014  . Benign essential HTN 05/02/2014  . Arteriosclerosis of coronary artery 04/05/2014    Overview:  Sp cabg with lima to lad svg to d1 om1 rca 2004 with occluded svg to rca and d1 and pci stetn of om1 graft 2015   . Chronic systolic heart failure ([place]) 04/19/2014    Overview:  With segmental LV systolic dysfunction  ejection fraction of 35%   . Antepartum deep phlebothrombosis ([place]) 07/28/2014  . Acute non-ST elevation myocardial infarction (NSTEMI) ([place]) 08/15/2014  . Myocardial infarction ([place]) 07/30/2014    Tobacco Use: History  Smoking status  . Current Some Day Smoker -- 0.25 packs/day for 20 years  . Types: Cigarettes  Smokeless tobacco  . Not on file    Labs: Recent Review Flowsheet Data    Labs for ITP Cardiac and Pulmonary Rehab Latest Ref Rng 05/03/2013   Cholestrol 0-200 mg/dL 196   LDLCALC 0-100 mg/dL 121(H)   HDL 40-60 mg/dL 42   Trlycerides 0-200 mg/dL 164       Exercise Target Goals:    Exercise Program Goal: Individual exercise prescription set with THRR, safety & activity barriers. Participant  demonstrates ability to understand and report RPE using BORG scale, to self-measure pulse accurately, and to acknowledge the importance of the exercise prescription.  Exercise Prescription Goal: Starting with aerobic activity 30 plus minutes a day, 3 days per week for initial exercise prescription. Provide home exercise prescription and guidelines that participant acknowledges understanding prior to discharge.  Activity Barriers & Risk Stratification:     Activity Barriers & Risk Stratification - 09/26/14 1158    Activity Barriers & Risk Stratification   Activity Barriers Arthritis   Risk Stratification High      6 Minute Walk:     6 Minute Walk      09/26/14 1259 11/18/14 0820     6 Minute Walk   Phase Initial Discharge    Distance 1330 feet 1790 feet    Distance % Change  35 %    Walk Time 6 minutes 6 minutes    Resting HR 74 bpm 84 bpm    Resting BP 112/78 mmHg 118/80 mmHg    Max Ex. HR 89 bpm 102 bpm    Max Ex. BP 130/70 mmHg 124/80 mmHg    RPE 11 13    Symptoms No No       Initial Exercise Prescription:     Initial Exercise Prescription - 09/26/14 1300    Date of Initial Exercise Prescription   Date 09/26/14   Treadmill   MPH 2.5   Grade 0   Minutes 15   Bike   Level 1   Minutes 15   Recumbant Bike   Level 2   Watts 30   Minutes 15   NuStep   Level 3   Watts 40   Minutes 15   Arm Ergometer   Level 1   Watts 10   Minutes 15   Arm/Foot Ergometer   Level 1   Watts 15   Minutes 15   Cybex   Level 2   RPM 20   Minutes 15   Recumbant Elliptical   Level 2   RPM 40   Watts 20   Minutes 15   Elliptical   Level 1   Speed 2.5   Minutes 5   REL-XR   Level 3   Watts 40   Minutes 15   Prescription Details   Frequency (times per week) 3   Duration Progress to 30 minutes of continuous aerobic without signs/symptoms of physical distress   Intensity   THRR REST +  30   Ratings of Perceived Exertion 11-13   Progression Continue progressive  overload as per policy without signs/symptoms or physical distress.   Resistance Training   Training Prescription Yes   Weight 2   Reps 10-12  Exercise Prescription Changes:     Exercise Prescription Changes      10/11/14 0700 10/17/14 0800 10/26/14 0800 11/02/14 0800 11/08/14 0600   Exercise Review   Progression _0    Response to Exercise   Blood Pressure (Admit) 128/70 mmHg 128/70 mmHg   130/78 mmHg   Blood Pressure (Exercise) 156/82 mmHg 156/82 mmHg   132/80 mmHg   Blood Pressure (Exit) 126/72 mmHg 126/72 mmHg   120/74 mmHg   Heart Rate (Admit) 74 bpm 74 bpm   81 bpm   Heart Rate (Exercise) 91 bpm 91 bpm   102 bpm   Heart Rate (Exit) 72 bpm 72 bpm   68 bpm   Rating of Perceived Exertion (Exercise) _1 Symptoms None None  None None   Comments Reviewed individualized exercise prescription and made increases per departmental policy. Exercise increases were discussed with the patient and they were able to perform the new work loads without issue (no signs or symptoms).  Reviewed individualized exercise prescription and made increases per departmental policy. Exercise increases were discussed with the patient and they were able to perform the new work loads without issue (no signs or symptoms). [name] is doing well in class and is enjoying the program.   Talked with [name] about his current workloads and made increases in level on the bike and added an incline on the treadmill. [name] is encouaged by the progress he is making and appreciates the exercise challenges.  Discussed adding one day a week of exercise at home in addition to the three days a week at [facility]. Explained that 150 minutes a week of exercise is the goal for combating and managing chronic health conditions. This volume of exercise is also proven to give other health benefits.    Frequency     Add 1 additional day to program exercise sessions.   Duration Progress to 50 minutes of aerobic without  signs/symptoms of physical distress Progress to 50 minutes of aerobic without signs/symptoms of physical distress  Progress to 50 minutes of aerobic without signs/symptoms of physical distress Progress to 50 minutes of aerobic without signs/symptoms of physical distress   Intensity Rest + 30 Rest + 30  Rest + 30 Rest + 30   Progression Continue progressive overload as per policy without signs/symptoms or physical distress. Continue progressive overload as per policy without signs/symptoms or physical distress.  Continue progressive overload as per policy without signs/symptoms or physical distress. Continue progressive overload as per policy without signs/symptoms or physical distress.   Resistance Training   Training Prescription Yes Yes  Yes Yes   Weight _2 Reps 10-15 10-15  10-15 10-15   Interval Training   Interval Training No No Yes Yes Yes   Equipment   Recumbant Bike Recumbant Bike Recumbant Bike   Comments   HIIT was explained to the patient who wanted to begin with it on the [place].  Stated full understanding and demonstrated well. HIIT was explained to the patient who wanted to begin with it on the [place].  Stated full understanding and demonstrated well. HIIT was explained to the patient who wanted to begin with it on the [place].  Stated full understanding and demonstrated well.   Treadmill   MPH 2.7 2._3 Grade 0 0  1 1   Minutes _4 Recumbant Bike   Level  12 12   RPM    45 45   Watts    80 80   Minutes    20 20   NuStep   Level     6  T5   Watts     40   Minutes     20   Recumbant Elliptical   Level 3  BioStep 3  BioStep  3  BioStep 3  BioStep   Watts _0 Minutes _1 REL-XR   Level _2 Watts 60 65  65 65   Minutes _3 11/16/14 0800 11/18/14 0800         Exercise Review   Progression Yes Yes      Response to Exercise   Symptoms None None      Comments Discussed adding one day a week of exercise at home  in addition to the three days a week at [facility]. Explained that 150 minutes a week of exercise is the goal for combating and managing chronic health conditions. This volume of exercise is also proven to give other health benefits.  Patient is approaching graduation of the program and home exercise plans were discussed. Details of the patient's exercise prescription and what they need to do in order to continue the prescription and progress with exercise were outlined and the patient verbalized understanding. The patient plans to complete all exercise at the [facility] fitness center. An orientation appointment has been made for Nov. 21st.       Frequency Add 1 additional day to program exercise sessions.       Duration Progress to 50 minutes of aerobic without signs/symptoms of physical distress Progress to 50 minutes of aerobic without signs/symptoms of physical distress      Intensity Rest + 30 Rest + 30      Progression Continue progressive overload as per policy without signs/symptoms or physical distress. Continue progressive overload as per policy without signs/symptoms or physical distress.      Resistance Training   Training Prescription Yes Yes      Weight 3 3      Reps 10-15 10-15      Interval Training   Interval Training Yes Yes      Equipment Recumbant Bike;REL-XR Recumbant Bike;REL-XR      Comments HIIT was explained to the patient who wanted to begin with it on the [place].  Stated full understanding and demonstrated well. HIIT was explained to the patient who wanted to begin with it on the [place].  Stated full understanding and demonstrated well.      Treadmill   MPH 3 3      Grade 1 1      Minutes 20 20      Recumbant Bike   Level 12 12      RPM 45 45      Watts 80 80      Minutes 20 20      NuStep   Level 6  T5 6  T5      Watts 40 40      Minutes 20 20      Recumbant Elliptical   Level 3  BioStep 3  BioStep      Watts 30 30      Minutes 20 20      REL-XR   Level 6 6  JGGEZ 66 29      Minutes 15 15      Home Exercise Plan   Plans to continue exercise at  [facility]         Discharge Exercise Prescription (Final Exercise Prescription Changes):     Exercise Prescription Changes - 11/18/14 0800    Exercise Review   Progression Yes   Response to Exercise   Symptoms None   Comments Patient is approaching graduation of the program and home exercise plans were discussed. Details of the patient's exercise prescription and what they need to do in order to continue the prescription and progress with exercise were outlined and the patient verbalized understanding. The patient plans to complete all exercise at the [facility] fitness center. An orientation appointment has been made for Nov. 21st.    Duration Progress to 50 minutes of aerobic without signs/symptoms of physical distress   Intensity Rest + 30   Progression Continue progressive overload as per policy without signs/symptoms or physical distress.   Resistance Training   Training Prescription Yes   Weight 3   Reps 10-15   Interval Training   Interval Training Yes   Equipment Recumbant UTML;YYT-KP   Comments HIIT was explained to the patient who wanted to begin with it on the [place].  Stated full understanding and demonstrated well.   Treadmill   MPH 3   Grade 1   Minutes 20   Recumbant Bike   Level 12   RPM 45   Watts 80   Minutes 20   NuStep   Level 6  T5   Watts 40   Minutes 20   Recumbant Elliptical   Level 3  BioStep   Watts 30   Minutes 20   REL-XR   Level 6   Watts 65   Minutes 15   Home Exercise Plan   Plans to continue exercise at [facility]      Nutrition:  Target Goals: Understanding of nutrition guidelines, daily intake of sodium <1532m, cholesterol <2062m calories 30% from fat and 7% or less from saturated fats, daily to have 5 or more servings of fruits and vegetables.  Biometrics:     Pre Biometrics - 09/26/14 1307    Pre Biometrics   Height 6'  (1.829 m)   Weight 211 lb 9.6 oz (95.981 kg)   Waist Circumference 42.75 inches   Hip Circumference 41 inches   Waist to Hip Ratio 1.04 %   BMI (Calculated) 28.8         Post Biometrics - 11/18/14 0820     Post  Biometrics   Height 6' (1.829 m)   Weight 211 lb (95.709 kg)   Waist Circumference 41.5 inches   Hip Circumference 40 inches   Waist to Hip Ratio 1.04 %   BMI (Calculated) 28.7      Nutrition Therapy Plan and Nutrition Goals:   Nutrition Discharge: Rate Your Plate Scores:     Rate Your Plate - 1154/65/6871275  Rate Your Plate Scores   Post Score 73   Post Score % 81 %      Nutrition Goals Re-Evaluation:     Nutrition Goals Re-Evaluation      10/12/14 1852 11/11/14 1123 11/23/14 1028       Personal Goal #1 Re-Evaluation   Personal Goal #1 I asked [email or web address] and he said he prefers not to meet individuallly with the Cardiac Rehab Registered Dietician since he feels he eats  healthy now.   [name] eats healthy he says     Goal Progress Seen Yes Yes Yes     Comments  He feels the Cardiac Rehab registered dietician answered all his questions in her lecture/education session so he wanted [place] to cancel his individual appt with her. I sent Karolee Stamps, RD an email to cancel appt.         Psychosocial: Target Goals: Acknowledge presence or absence of depression, maximize coping skills, provide positive support system. Participant is able to verbalize types and ability to use techniques and skills needed for reducing stress and depression.  Initial Review & Psychosocial Screening:     Initial Psych Review & Screening - 09/26/14 1201    Family Dynamics   Good Support System? Yes   Comments Lives alone  separated for over 4 years      Quality of Life Scores:     Quality of Life - 11/16/14 1757    Quality of Life Scores   Health/Function Post 24 %   Health/Function % Change 1 %   Socioeconomic Post 26.57 %   Socioeconomic % Change -8 %    Psych/Spiritual Post 24.86 %   Psych/Spiritual % Change -3 %   Family Post 27.3 %   Family % Change 14 %   GLOBAL Post 25.27 %   GLOBAL % Change 2 %      PHQ-9:     Recent Review Flowsheet Data    Depression screen [facility] 2/9 11/16/2014 09/26/2014   Decreased Interest 2 1   Down, Depressed, Hopeless 0 0   PHQ - 2 Score 2 1   Altered sleeping 0 1   Tired, decreased energy 2 1   Change in appetite 0 0   Feeling bad or failure about yourself  0 0   Trouble concentrating - 0   Moving slowly or fidgety/restless 0 0   Suicidal thoughts 0 0   PHQ-9 Score 4 3   Difficult doing work/chores Somewhat difficult Somewhat difficult      Psychosocial Evaluation and Intervention:     Psychosocial Evaluation - 10/03/14 1012    Psychosocial Evaluation & Interventions   Interventions Stress management education;Relaxation education;Encouraged to exercise with the program and follow exercise prescription   Comments Counselor met today with Mr. Santillanes for initial psychosocial evaluation.  He is a 69 year old who had a heart attack in July and is in this program for recovery.  Mr. Risdon has a strong support system with an adult daughter and a brother who live close by.  He reports he sleeps well and has a good appetite.  He denies a history of depression or anxiety or any current symptoms.  He has minimal stress in his life and states he is typically in a positive mood.  Mr. Jeanbaptiste goals are to increase his stamina and strength and "just to feel better" in this program.  He plans work out through a [employer] program following this program.     Continued Psychosocial Services Needed Yes  Mr. Matty will benefit from the psychoeducational components of this program and consistent exercise.        Psychosocial Re-Evaluation:     Psychosocial Re-Evaluation      10/19/14 1200 11/23/14 1037         Psychosocial Re-Evaluation   Interventions Stress management education;Encouraged to attend  Cardiac Rehabilitation for the exercise Encouraged to attend Cardiac Rehabilitation for the exercise      Comments Mr.  Fant has rheumatoid arthritis which is a stressor and makes things harder for him incl mowing his yard. Was able to mow 1/2 of it without stopping which is better.  I encouraged "[name]" to think of his granddaughter when he is thinking about smoking his afternoon cigarette when he is stressed or exercise.          Vocational Rehabilitation: Provide vocational rehab assistance to qualifying candidates.   Vocational Rehab Evaluation & Intervention:     Vocational Rehab - 09/26/14 1158    Initial Vocational Rehab Evaluation & Intervention   Assessment shows need for Vocational Rehabilitation No      Education: Education Goals: Education classes will be provided on a weekly basis, covering required topics. Participant will state understanding/return demonstration of topics presented.  Learning Barriers/Preferences:     Learning Barriers/Preferences - 09/26/14 1158    Learning Barriers/Preferences   Learning Barriers None   Learning Preferences None      Education Topics: General Nutrition Guidelines/Fats and Fiber: -Group instruction provided by verbal, written material, models and posters to present the general guidelines for heart healthy nutrition. Gives an explanation and review of dietary fats and fiber.          Cardiac Rehab from 11/21/2014 in [facility] Cardiac Rehab   Date  10/17/14   Educator  PI   Instruction Review Code  2- meets goals/outcomes      Controlling Sodium/Reading Food Labels: -Group verbal and written material supporting the discussion of sodium use in heart healthy nutrition. Review and explanation with models, verbal and written materials for utilization of the food label.      Cardiac Rehab from 11/21/2014 in [facility] Cardiac Rehab   Date  10/31/14   Educator  CR   Instruction Review Code  2- meets goals/outcomes      Exercise  Physiology & Risk Factors: - Group verbal and written instruction with models to review the exercise physiology of the cardiovascular system and associated critical values. Details cardiovascular disease risk factors and the goals associated with each risk factor.      Cardiac Rehab from 11/21/2014 in [facility] Cardiac Rehab   Date  11/09/14   Educator  RM   Instruction Review Code  2- meets goals/outcomes      Aerobic Exercise & Resistance Training: - Gives group verbal and written discussion on the health impact of inactivity. On the components of aerobic and resistive training programs and the benefits of this training and how to safely progress through these programs.      Cardiac Rehab from 11/21/2014 in [facility] Cardiac Rehab   Date  11/14/14   Educator  RM   Instruction Review Code  2- meets goals/outcomes      Flexibility, Balance, General Exercise Guidelines: - Provides group verbal and written instruction on the benefits of flexibility and balance training programs. Provides general exercise guidelines with specific guidelines to those with heart or lung disease. Demonstration and skill practice provided.      Cardiac Rehab from 11/21/2014 in [facility] Cardiac Rehab   Date  11/21/14   Educator  RM   Instruction Review Code  2- meets goals/outcomes      Stress Management: - Provides group verbal and written instruction about the health risks of elevated stress, cause of high stress, and healthy ways to reduce stress.   Depression: - Provides group verbal and written instruction on the correlation between heart/lung disease and depressed mood, treatment options, and the stigmas associated with seeking treatment.  Cardiac Rehab from 11/21/2014 in [facility] Cardiac Rehab   Date  11/02/14   Educator  [facility]   Instruction Review Code  2- meets goals/outcomes      Anatomy & Physiology of the Heart: - Group verbal and written instruction and models provide basic cardiac anatomy and  physiology, with the coronary electrical and arterial systems. Review of: AMI, Angina, Valve disease, Heart Failure, Cardiac Arrhythmia, Pacemakers, and the ICD.      Cardiac Rehab from 11/21/2014 in [facility] Cardiac Rehab   Date  10/03/14   Educator  SB   Instruction Review Code  2- meets goals/outcomes      Cardiac Procedures: - Group verbal and written instruction and models to describe the testing methods done to diagnose heart disease. Reviews the outcomes of the test results. Describes the treatment choices: Medical Management, Angioplasty, or Coronary Bypass Surgery.      Cardiac Rehab from 11/21/2014 in [facility] Cardiac Rehab   Date  10/24/14   Educator  SB   Instruction Review Code  2- meets goals/outcomes      Cardiac Medications: - Group verbal and written instruction to review commonly prescribed medications for heart disease. Reviews the medication, class of the drug, and side effects. Includes the steps to properly store meds and maintain the prescription regimen.      Cardiac Rehab from 11/21/2014 in [facility] Cardiac Rehab   Date  11/07/14   Educator  SB   Instruction Review Code  2- meets goals/outcomes      Go Sex-Intimacy & Heart Disease, Get SMART - Goal Setting: - Group verbal and written instruction through game format to discuss heart disease and the return to sexual intimacy. Provides group verbal and written material to discuss and apply goal setting through the application of the S.M.A.R.T. Method.      Cardiac Rehab from 11/21/2014 in [facility] Cardiac Rehab   Date  10/24/14   Educator  SB   Instruction Review Code  2- meets goals/outcomes      Other Matters of the Heart: - Provides group verbal, written materials and models to describe Heart Failure, Angina, Valve Disease, and Diabetes in the realm of heart disease. Includes description of the disease process and treatment options available to the cardiac patient.      Cardiac Rehab from 11/21/2014 in [facility] Cardiac Rehab    Date  10/12/14   Educator  [facility]   Instruction Review Code  2- meets goals/outcomes      Exercise & Equipment Safety: - Individual verbal instruction and demonstration of equipment use and safety with use of the equipment.      Cardiac Rehab from 11/21/2014 in [facility] Cardiac Rehab   Date  09/26/14   Educator  SB   Instruction Review Code  2- meets goals/outcomes      Infection Prevention: - Provides verbal and written material to individual with discussion of infection control including proper hand washing and proper equipment cleaning during exercise session.      Cardiac Rehab from 11/21/2014 in [facility] Cardiac Rehab   Date  09/26/14   Educator  SB   Instruction Review Code  2- meets goals/outcomes      Falls Prevention: - Provides verbal and written material to individual with discussion of falls prevention and safety.      Cardiac Rehab from 11/21/2014 in [facility] Cardiac Rehab   Date  09/26/14   Educator  SB   Instruction Review Code  2- meets goals/outcomes  Diabetes: - Individual verbal and written instruction to review signs/symptoms of diabetes, desired ranges of glucose level fasting, after meals and with exercise. Advice that pre and post exercise glucose checks will be done for 3 sessions at entry of program.    Knowledge Questionnaire Score:     Knowledge Questionnaire Score - 11/16/14 1758    Knowledge Questionnaire Score   Post Score 25      Personal Goals and Risk Factors at Admission:     Personal Goals and Risk Factors at Admission - 09/26/14 1202    Personal Goals and Risk Factors on Admission   Increase Aerobic Exercise and Physical Activity Yes;Sedentary   Intervention While in program, learn and follow the exercise prescription taught. Start at a low level workload and increase workload after able to maintain previous level for 30 minutes. Increase time before increasing intensity.   Intervention Provide exercise education and an individualized  exercise prescription that will provide continued progressive overload as per policy without signs/symptoms of physical distress.   Quit Smoking Yes   Number of packs per day 3 to 4 cigarettes a day   Intervention Utilize your health care professional team to help with smoking cessation while in the program. Your doctor can prescribe medications to aid in cessation. The program can provide information and counseling as needed.   Take Less Medication Yes   Intervention Learn your risk factors and begin the lifestyle modifications for risk factor control during your time in the program.   Diabetes No   Hypertension Yes   Goal Participant will see blood pressure controlled within the values of 140/61m/Hg or within value directed by their physician.   Intervention Provide nutrition & aerobic exercise along with prescribed medications to achieve BP 140/90 or less.   Lipids Yes   Goal Cholesterol controlled with medications as prescribed, with individualized exercise RX and with personalized nutrition plan. Value goals: LDL < 751m HDL > 4012mParticipant states understanding of desired cholesterol values and following prescriptions.   Intervention Provide nutrition & aerobic exercise along with prescribed medications to achieve LDL <51m51mDL >40mg52m   Personal Goals and Risk Factors Review:      Goals and Risk Factor Review      10/19/14 1201 10/21/14 0937 16106/16 0929 11/02/14 1200 11/23/14 1029   Increase Aerobic Exercise and Physical Activity   Goals Progress/Improvement seen  Yes Yes Yes  Yes   Comments Mr. ParkeBlaylockrheumatoid arthritis which is a stressor and makes things harder for him incl mowing his yard. Was able to mow 1/2 of it without stopping which is better [name] iWille Glasermproving on all of the exercise equipment and is up to 2.7mph 23mthe treadmill. He is very compliant with exercise increases and he is encouraged by all the progress he has made so far on the equipment.  Making  significant gains on equipment, however, still experiencing some leg pain due to medications.     Quit Smoking   Goals Progress/Improvement seen    Yes;No    Comments    [name] continues to smoke 1-3 cigarettes a day. He is not ready to quit yet. We did review options in the community when he is ready to quit. Introduced QuitSmLobbyistm.    Hypertension   Goal --  Mr. ParkerWilhoite pressure is well controlled.   Participant will see blood pressure controlled within the values of 140/90mm/H95m within value directed by their physician. Participant will  see blood pressure controlled within the values of 140/49m/Hg or within value directed by their physician.    Progress seen toward goals    Yes    Comments    [name] is aware of his BP readings and is maintianing good numbers. He is watching sodium intake and eating as healthy as he can.    Abnormal Lipids   Goal   Cholesterol controlled with medications as prescribed, with individualized exercise RX and with personalized nutrition plan. Value goals: LDL < 722m HDL > 4034mParticipant states understanding of desired cholesterol values and following prescriptions.  medication side effects, still monitoring blood panel for changies Cholesterol controlled with medications as prescribed, with individualized exercise RX and with personalized nutrition plan. Value goals: LDL < 45m44mDL > 40mg73mrticipant states understanding of desired cholesterol values and following prescriptions.    Progress seen towards goals    Unknown    Comments    [name] hWille Glasernot had a labs recently. He does not like taking the medication for cholesterol control as it causes muscle aches off and on. His doctor is aware of the symptoms and has made several changes in the medication . [name] continues to have the muscle soreness and is living with it. The soreness is not consistent.      11/23/14 1034           Increase Aerobic Exercise and Physical Activity   Goals  Progress/Improvement seen  Yes       Comments "[name]" Eryc will exercise in [employer] and ReneeJoseph Artalready made him a follow up orientation appt for there.        Quit Smoking   Goals Progress/Improvement seen Yes       Comments HOpes to cut back on those 2 cigarettes a day. [name] reports he smokes one in the am after breakfast then one in the afternoon if he is stressed. I encouraged him to cut back on the afternoon one by thinking he wanted to see his granddaughter that he helps with alot graduate from college.        Hypertension   Progress seen toward goals Yes          Personal Goals Discharge (Final Personal Goals and Risk Factors Review):      Goals and Risk Factor Review - 11/23/14 1034    Increase Aerobic Exercise and Physical Activity   Goals Progress/Improvement seen  Yes   Comments "[name]" [place] will exercise in [employer] and ReneeJoseph Artalready made him a follow up orientation appt for there.    Quit Smoking   Goals Progress/Improvement seen Yes   Comments HOpes to cut back on those 2 cigarettes a day. [name] reports he smokes one in the am after breakfast then one in the afternoon if he is stressed. I encouraged him to cut back on the afternoon one by thinking he wanted to see his granddaughter that he helps with alot graduate from college.    Hypertension   Progress seen toward goals Yes       Comments: Almost ready to finish Cardiac Rehab 36 sessions.

## 2014-11-23 NOTE — Progress Notes (Signed)
Daily Session Note  Patient Details  Name: DAVAUGHN HILLYARD MRN: [id number] Date of Birth: 1945/09/07 Referring Provider:  Madelyn Brunner, MD  Encounter Date: 11/23/2014  Check In:     Session Check In - 11/23/14 1027    Check-In   Staff Present Gerlene Burdock RN, BSN;Steven Way BS, ACSM EP-C, Exercise Physiologist;Renee Dillard [place] MS, ACSM CEP Exercise Physiologist   ER physicians immediately available to respond to emergencies See telemetry face sheet for immediately available ER MD   Medication changes reported     No   Fall or balance concerns reported    No   Warm-up and Cool-down Performed on first and last piece of equipment   VAD Patient? No   Pain Assessment   Currently in Pain? No/denies         Goals Met:  Proper associated with RPD/PD & O2 Sat Exercise tolerated well  Goals Unmet:  Not Applicable  Goals Comments: Almost ready to graduate from Cardiac Rehab.    Dr. Emily Filbert is Medical Director for [facility].

## 2014-11-25 ENCOUNTER — Other Ambulatory Visit: Payer: Self-pay

## 2014-11-25 ENCOUNTER — Ambulatory Visit (INDEPENDENT_AMBULATORY_CARE_PROVIDER_SITE_OTHER): Payer: Medicare PPO | Admitting: Urology

## 2014-11-25 ENCOUNTER — Encounter: Payer: Medicare PPO | Admitting: *Deleted

## 2014-11-25 ENCOUNTER — Encounter: Payer: Self-pay | Admitting: Urology

## 2014-11-25 VITALS — BP 120/75 | HR 75 | Ht 72.0 in | Wt 212.4 lb

## 2014-11-25 DIAGNOSIS — N529 Male erectile dysfunction, unspecified: Secondary | ICD-10-CM

## 2014-11-25 DIAGNOSIS — N486 Induration penis plastica: Secondary | ICD-10-CM

## 2014-11-25 DIAGNOSIS — I2119 ST elevation (STEMI) myocardial infarction involving other coronary artery of inferior wall: Secondary | ICD-10-CM | POA: Diagnosis not present

## 2014-11-25 DIAGNOSIS — N3941 Urge incontinence: Secondary | ICD-10-CM | POA: Diagnosis not present

## 2014-11-25 DIAGNOSIS — N400 Enlarged prostate without lower urinary tract symptoms: Secondary | ICD-10-CM

## 2014-11-25 LAB — URINALYSIS, COMPLETE
Bilirubin, UA: NEGATIVE
Glucose, UA: NEGATIVE
Ketones, UA: NEGATIVE
Leukocytes, UA: NEGATIVE
Nitrite, UA: NEGATIVE
Specific Gravity, UA: >1.03 — ABNORMAL HIGH (ref 1.005–1.030)
Urobilinogen, Ur: 0.2 mg/dL (ref 0.2–1.0)
pH, UA: 5 (ref 5.0–7.5)

## 2014-11-25 LAB — BLADDER SCAN AMB NON-IMAGING: SCAN RESULT: 48

## 2014-11-25 LAB — MICROSCOPIC EXAMINATION
Bacteria, UA: NONE SEEN
Epithelial Cells (non renal): NONE SEEN /hpf (ref 0–10)

## 2014-11-25 MED ORDER — CIPROFLOXACIN HCL 500 MG PO TABS
500.0000 mg | ORAL_TABLET | Freq: Once | ORAL | Status: AC
  Administered 2014-11-25: 500 mg via ORAL

## 2014-11-25 MED ORDER — LIDOCAINE HCL 2 % EX GEL
1.0000 "application " | Freq: Once | CUTANEOUS | Status: AC
  Administered 2014-11-25: 1 via URETHRAL

## 2014-11-25 NOTE — Addendum Note (Signed)
Addended by: Wilson Singer on: 11/25/2014 10:36 AM   Modules accepted: Orders

## 2014-11-25 NOTE — Progress Notes (Signed)
Cardiac Individual Treatment Plan  Patient Details  Name: Martin Adkins MRN: [id number] Date of Birth: 10-13-1945 Referring Provider:  Corey Skains, MD  Initial Encounter Date:  09/26/2014  Visit Diagnosis: ST elevation myocardial infarction (STEMI) involving other coronary artery of inferior wall (Accident)  Patient's Home Medications on Admission:  Current outpatient prescriptions:  .  amLODipine (NORVASC) 10 MG tablet, , Disp: , Rfl:  .  aspirin 81 MG tablet, Take 1 tablet by mouth daily., Disp: , Rfl:  .  aspirin EC 81 MG tablet, Take by mouth., Disp: , Rfl:  .  atorvastatin (LIPITOR) 80 MG tablet, TAKE 1 TABLET (80 MG TOTAL) BY MOUTH ONCE DAILY., Disp: , Rfl: 11 .  benazepril (LOTENSIN) 20 MG tablet, Take 1 tablet by mouth daily., Disp: , Rfl:  .  chlorthalidone (HYGROTON) 25 MG tablet, Take 1 tablet by mouth daily., Disp: , Rfl:  .  clopidogrel (PLAVIX) 75 MG tablet, TAKE 1 TABLET (75 MG TOTAL) BY MOUTH ONCE DAILY., Disp: , Rfl: 11 .  fluticasone (FLONASE) 50 MCG/ACT nasal spray, , Disp: , Rfl:  .  Heat Wraps (HEAT THERAPY PATCHES) MISC, , Disp: , Rfl:  .  hydroxychloroquine (PLAQUENIL) 200 MG tablet, , Disp: , Rfl:  .  isosorbide mononitrate (IMDUR) 30 MG 24 hr tablet, , Disp: , Rfl:  .  LUBRICANT EYE DROPS 0.4-0.3 % SOLN, , Disp: , Rfl:  .  metoprolol succinate (TOPROL-XL) 100 MG 24 hr tablet, , Disp: , Rfl:  .  mirabegron ER (MYRBETRIQ) 25 MG TB24 tablet, Take 1 tablet (25 mg total) by mouth daily., Disp: 30 tablet, Rfl: 0 .  Naphazoline-Polyethyl Glycol 0.012-0.2 % SOLN, Place 1 drop into both eyes daily., Disp: , Rfl:  .  NASAL DECONGESTANT SPRAY 0.05 % nasal spray, , Disp: , Rfl:  .  Neomycin-Bacitracin-Polymyxin (HCA TRIPLE ANTIBIOTIC OINTMENT EX), , Disp: , Rfl:  .  pantoprazole (PROTONIX) 40 MG tablet, , Disp: , Rfl:  .  Polyvinyl Alcohol (LUBRICANT DROPS OP), Place 1 drop into both eyes daily., Disp: , Rfl:  .  potassium chloride SA (K-DUR,KLOR-CON) 20 MEQ tablet,  , Disp: , Rfl:  .  valsartan (DIOVAN) 160 MG tablet, Take 160 mg by mouth daily., Disp: , Rfl:  .  warfarin (COUMADIN) 2.5 MG tablet, 2.5 mg. 5 mg one day alternate with 7 mg next day, Disp: , Rfl:  .  warfarin (COUMADIN) 5 MG tablet, TAKE 1 TABLET (5 MG TOTAL) BY MOUTH ONCE DAILY., Disp: , Rfl: 5  Current facility-administered medications:  .  ciprofloxacin (CIPRO) tablet 500 mg, 500 mg, Oral, Once, Nickie Retort, MD .  lidocaine (XYLOCAINE) 2 % jelly 1 application, 1 application, Urethral, Once, Nickie Retort, MD  Past Medical History: Past Medical History  Diagnosis Date  . Hyperlipemia   . RA (rheumatoid arthritis) ([place])   . OSA (obstructive sleep apnea)   . PVD (peripheral vascular disease) ([place])   . GERD (gastroesophageal reflux disease)   . Osteoarthritis   . NSTEMI (non-ST elevated myocardial infarction) ([place])   . Hyperplastic polyps of stomach   . Barrett's esophagus   . Diabetes ([place])   . DVT (deep venous thrombosis) ([place])   . Abdominal aortic aneurysm without rupture ([place])   . CAD (coronary artery disease)   . SOB (shortness of breath)   . CHF (congestive heart failure) ([place])   . Hypertension   . Arthritis   . BPH (benign prostatic hyperplasia)   . AAA (abdominal aortic  aneurysm) without rupture ([place]) 04/05/2014  . Benign essential HTN 05/02/2014  . Arteriosclerosis of coronary artery 04/05/2014    Overview:  Sp cabg with lima to lad svg to d1 om1 rca 2004 with occluded svg to rca and d1 and pci stetn of om1 graft 2015   . Chronic systolic heart failure ([place]) 04/19/2014    Overview:  With segmental LV systolic dysfunction ejection fraction of 35%   . Antepartum deep phlebothrombosis ([place]) 07/28/2014  . Acute non-ST elevation myocardial infarction (NSTEMI) ([place]) 08/15/2014  . Myocardial infarction ([place]) 07/30/2014    Tobacco Use: History  Smoking status  . Current Some Day Smoker -- 0.25 packs/day for 20 years  . Types: Cigarettes  Smokeless tobacco  . Not  on file    Labs: Recent Review Flowsheet Data    Labs for ITP Cardiac and Pulmonary Rehab Latest Ref Rng 05/03/2013   Cholestrol 0-200 mg/dL 196   LDLCALC 0-100 mg/dL 121(H)   HDL 40-60 mg/dL 42   Trlycerides 0-200 mg/dL 164       Exercise Target Goals:    Exercise Program Goal: Individual exercise prescription set with THRR, safety & activity barriers. Participant demonstrates ability to understand and report RPE using BORG scale, to self-measure pulse accurately, and to acknowledge the importance of the exercise prescription.  Exercise Prescription Goal: Starting with aerobic activity 30 plus minutes a day, 3 days per week for initial exercise prescription. Provide home exercise prescription and guidelines that participant acknowledges understanding prior to discharge.  Activity Barriers & Risk Stratification:     Activity Barriers & Risk Stratification - 09/26/14 1158    Activity Barriers & Risk Stratification   Activity Barriers Arthritis   Risk Stratification High      6 Minute Walk:     6 Minute Walk      09/26/14 1259 11/18/14 0820     6 Minute Walk   Phase Initial Discharge    Distance 1330 feet 1790 feet    Distance % Change  35 %    Walk Time 6 minutes 6 minutes    Resting HR 74 bpm 84 bpm    Resting BP 112/78 mmHg 118/80 mmHg    Max Ex. HR 89 bpm 102 bpm    Max Ex. BP 130/70 mmHg 124/80 mmHg    RPE 11 13    Symptoms No No       Initial Exercise Prescription:     Initial Exercise Prescription - 09/26/14 1300    Date of Initial Exercise Prescription   Date 09/26/14   Treadmill   MPH 2.5   Grade 0   Minutes 15   Bike   Level 1   Minutes 15   Recumbant Bike   Level 2   Watts 30   Minutes 15   NuStep   Level 3   Watts 40   Minutes 15   Arm Ergometer   Level 1   Watts 10   Minutes 15   Arm/Foot Ergometer   Level 1   Watts 15   Minutes 15   Cybex   Level 2   RPM 20   Minutes 15   Recumbant Elliptical   Level 2   RPM 40    Watts 20   Minutes 15   Elliptical   Level 1   Speed 2.5   Minutes 5   REL-XR   Level 3   Watts 40   Minutes 15   Prescription Details   Frequency (  times per week) 3   Duration Progress to 30 minutes of continuous aerobic without signs/symptoms of physical distress   Intensity   THRR REST +  30   Ratings of Perceived Exertion 11-13   Progression Continue progressive overload as per policy without signs/symptoms or physical distress.   Resistance Training   Training Prescription Yes   Weight 2   Reps 10-12      Exercise Prescription Changes:     Exercise Prescription Changes      10/11/14 0700 10/17/14 0800 10/26/14 0800 11/02/14 0800 11/08/14 0600   Exercise Review   Progression Yes Yes Yes Yes Yes   Response to Exercise   Blood Pressure (Admit) 128/70 mmHg 128/70 mmHg   130/78 mmHg   Blood Pressure (Exercise) 156/82 mmHg 156/82 mmHg   132/80 mmHg   Blood Pressure (Exit) 126/72 mmHg 126/72 mmHg   120/74 mmHg   Heart Rate (Admit) 74 bpm 74 bpm   81 bpm   Heart Rate (Exercise) 91 bpm 91 bpm   102 bpm   Heart Rate (Exit) 72 bpm 72 bpm   68 bpm   Rating of Perceived Exertion (Exercise) '11 11   13   ' Symptoms None None  None None   Comments Reviewed individualized exercise prescription and made increases per departmental policy. Exercise increases were discussed with the patient and they were able to perform the new work loads without issue (no signs or symptoms).  Reviewed individualized exercise prescription and made increases per departmental policy. Exercise increases were discussed with the patient and they were able to perform the new work loads without issue (no signs or symptoms). [name] is doing well in class and is enjoying the program.   Talked with [name] about his current workloads and made increases in level on the bike and added an incline on the treadmill. [name] is encouaged by the progress he is making and appreciates the exercise challenges.  Discussed adding one day a  week of exercise at home in addition to the three days a week at [facility]. Explained that 150 minutes a week of exercise is the goal for combating and managing chronic health conditions. This volume of exercise is also proven to give other health benefits.    Frequency     Add 1 additional day to program exercise sessions.   Duration Progress to 50 minutes of aerobic without signs/symptoms of physical distress Progress to 50 minutes of aerobic without signs/symptoms of physical distress  Progress to 50 minutes of aerobic without signs/symptoms of physical distress Progress to 50 minutes of aerobic without signs/symptoms of physical distress   Intensity Rest + 30 Rest + 30  Rest + 30 Rest + 30   Progression Continue progressive overload as per policy without signs/symptoms or physical distress. Continue progressive overload as per policy without signs/symptoms or physical distress.  Continue progressive overload as per policy without signs/symptoms or physical distress. Continue progressive overload as per policy without signs/symptoms or physical distress.   Resistance Training   Training Prescription Yes Yes  Yes Yes   Weight '3 3  3 3   ' Reps 10-15 10-15  10-15 10-15   Interval Training   Interval Training No No Yes Yes Yes   Equipment   Recumbant Bike Recumbant Bike Recumbant Bike   Comments   HIIT was explained to the patient who wanted to begin with it on the [place].  Stated full understanding and demonstrated well. HIIT was explained to the patient who wanted to  begin with it on the [place].  Stated full understanding and demonstrated well. HIIT was explained to the patient who wanted to begin with it on the [place].  Stated full understanding and demonstrated well.   Treadmill   MPH 2.7 2.'7  3 3   ' Grade 0 0  1 1   Minutes '20 20  20 20   ' Recumbant Bike   Level    12 12   RPM    45 45   Watts    80 80   Minutes    20 20   NuStep   Level     6  T5   Watts     40   Minutes     20   Recumbant  Elliptical   Level 3  BioStep 3  BioStep  3  BioStep 3  BioStep   Watts '30 30  30 30   ' Minutes '20 20  20 20   ' REL-XR   Level '3 6  6 6   ' Watts 60 65  65 65   Minutes '15 15  15 15     ' 11/16/14 0800 11/18/14 0800 11/25/14 0900       Exercise Review   Progression Yes Yes Yes     Response to Exercise   Symptoms None None None     Comments Discussed adding one day a week of exercise at home in addition to the three days a week at [facility]. Explained that 150 minutes a week of exercise is the goal for combating and managing chronic health conditions. This volume of exercise is also proven to give other health benefits.  Patient is approaching graduation of the program and home exercise plans were discussed. Details of the patient's exercise prescription and what they need to do in order to continue the prescription and progress with exercise were outlined and the patient verbalized understanding. The patient plans to complete all exercise at the [facility] fitness center. An orientation appointment has been made for Nov. 21st.  Patient is all set for intake into the fitness center. Last day of exercise was today. Reviewed all exercise goals and disucussed continuation of exercise as part of new lifestyle from now on.     Frequency Add 1 additional day to program exercise sessions.       Duration Progress to 50 minutes of aerobic without signs/symptoms of physical distress Progress to 50 minutes of aerobic without signs/symptoms of physical distress Progress to 50 minutes of aerobic without signs/symptoms of physical distress     Intensity Rest + 30 Rest + 30 Rest + 30     Progression Continue progressive overload as per policy without signs/symptoms or physical distress. Continue progressive overload as per policy without signs/symptoms or physical distress. Continue progressive overload as per policy without signs/symptoms or physical distress.     Resistance Training   Training Prescription Yes Yes  Yes     Weight '3 3 3     ' Reps 10-15 10-15 10-15     Interval Training   Interval Training Yes Yes Yes     Equipment Recumbant Bike;REL-XR Recumbant Bike;REL-XR Recumbant Bike;REL-XR     Comments HIIT was explained to the patient who wanted to begin with it on the [place].  Stated full understanding and demonstrated well. HIIT was explained to the patient who wanted to begin with it on the [place].  Stated full understanding and demonstrated well. HIIT was explained to the patient who wanted to begin with  it on the [place].  Stated full understanding and demonstrated well.     Treadmill   MPH '3 3 3     ' Grade '1 1 1     ' Minutes '20 20 20     ' Recumbant Bike   Level '12 12 12     ' RPM 45 45 45     Watts 80 80 80     Minutes '20 20 20     ' NuStep   Level 6  T5 6  T5 6  T5     Watts 40 40 40     Minutes '20 20 20     ' Recumbant Elliptical   Level 3  BioStep 3  BioStep 3  BioStep     Watts '30 30 30     ' Minutes '20 20 20     ' REL-XR   Level '6 6 12     ' Watts 65 65 100     Minutes '15 15 25     ' Home Exercise Plan   Plans to continue exercise at  [place]        Discharge Exercise Prescription (Final Exercise Prescription Changes):     Exercise Prescription Changes - 11/25/14 0900    Exercise Review   Progression Yes   Response to Exercise   Symptoms None   Comments Patient is all set for intake into the fitness center. Last day of exercise was today. Reviewed all exercise goals and disucussed continuation of exercise as part of new lifestyle from now on.   Duration Progress to 50 minutes of aerobic without signs/symptoms of physical distress   Intensity Rest + 30   Progression Continue progressive overload as per policy without signs/symptoms or physical distress.   Resistance Training   Training Prescription Yes   Weight 3   Reps 10-15   Interval Training   Interval Training Yes   Equipment Recumbant [id number]-AV   Comments HIIT was explained to the patient who  wanted to begin with it on the [place].  Stated full understanding and demonstrated well.   Treadmill   MPH 3   Grade 1   Minutes 20   Recumbant Bike   Level 12   RPM 45   Watts 80   Minutes 20   NuStep   Level 6  T5   Watts 40   Minutes 20   Recumbant Elliptical   Level 3  BioStep   Watts 30   Minutes 20   REL-XR   Level 12   Watts 100   Minutes 25   Home Exercise Plan   Plans to continue exercise at [facility]      Nutrition:  Target Goals: Understanding of nutrition guidelines, daily intake of sodium <158m, cholesterol <2070m calories 30% from fat and 7% or less from saturated fats, daily to have 5 or more servings of fruits and vegetables.  Biometrics:     Pre Biometrics - 09/26/14 1307    Pre Biometrics   Height 6' (1.829 m)   Weight 211 lb 9.6 oz (95.981 kg)   Waist Circumference 42.75 inches   Hip Circumference 41 inches   Waist to Hip Ratio 1.04 %   BMI (Calculated) 28.8         Post Biometrics - 11/18/14 0820     Post  Biometrics   Height 6' (1.829 m)   Weight 211 lb (95.709 kg)   Waist Circumference 41.5 inches   Hip Circumference 40  inches   Waist to Hip Ratio 1.04 %   BMI (Calculated) 28.7      Nutrition Therapy Plan and Nutrition Goals:   Nutrition Discharge: Rate Your Plate Scores:     Rate Your Plate - 04/54/09 8119    Rate Your Plate Scores   Post Score 73   Post Score % 81 %      Nutrition Goals Re-Evaluation:     Nutrition Goals Re-Evaluation      10/12/14 1852 11/11/14 1123 11/23/14 1028 11/25/14 1004     Personal Goal #1 Re-Evaluation   Personal Goal #1 I asked Mr.Lybrand again and he said he prefers not to meet individuallly with the Cardiac Rehab Registered Dietician since he feels he eats healthy now.   [name] eats healthy he says     Goal Progress Seen Yes Yes Yes Yes    Comments  He feels the Cardiac Rehab registered dietician answered all his questions in her lecture/education session so he wanted [place] to  cancel his individual appt with her. I sent Karolee Stamps, RD an email to cancel appt.         Psychosocial: Target Goals: Acknowledge presence or absence of depression, maximize coping skills, provide positive support system. Participant is able to verbalize types and ability to use techniques and skills needed for reducing stress and depression.  Initial Review & Psychosocial Screening:     Initial Psych Review & Screening - 09/26/14 1201    Family Dynamics   Good Support System? Yes   Comments Lives alone  separated for over 4 years      Quality of Life Scores:     Quality of Life - 11/16/14 1757    Quality of Life Scores   Health/Function Post 24 %   Health/Function % Change 1 %   Socioeconomic Post 26.57 %   Socioeconomic % Change -8 %   Psych/Spiritual Post 24.86 %   Psych/Spiritual % Change -3 %   Family Post 27.3 %   Family % Change 14 %   GLOBAL Post 25.27 %   GLOBAL % Change 2 %      PHQ-9:     Recent Review Flowsheet Data    Depression screen [facility] 2/9 11/16/2014 09/26/2014   Decreased Interest 2 1   Down, Depressed, Hopeless 0 0   PHQ - 2 Score 2 1   Altered sleeping 0 1   Tired, decreased energy 2 1   Change in appetite 0 0   Feeling bad or failure about yourself  0 0   Trouble concentrating - 0   Moving slowly or fidgety/restless 0 0   Suicidal thoughts 0 0   PHQ-9 Score 4 3   Difficult doing work/chores Somewhat difficult Somewhat difficult      Psychosocial Evaluation and Intervention:     Psychosocial Evaluation - 10/03/14 1012    Psychosocial Evaluation & Interventions   Interventions Stress management education;Relaxation education;Encouraged to exercise with the program and follow exercise prescription   Comments Counselor met today with Mr. Storey for initial psychosocial evaluation.  He is a 69 year old who had a heart attack in July and is in this program for recovery.  Mr. Phifer has a strong support system with an adult daughter and  a brother who live close by.  He reports he sleeps well and has a good appetite.  He denies a history of depression or anxiety or any current symptoms.  He has minimal stress in his life  and states he is typically in a positive mood.  Mr. Subramaniam goals are to increase his stamina and strength and "just to feel better" in this program.  He plans work out through a [employer] program following this program.     Continued Psychosocial Services Needed Yes  Mr. Tegtmeyer will benefit from the psychoeducational components of this program and consistent exercise.        Psychosocial Re-Evaluation:     Psychosocial Re-Evaluation      10/19/14 1200 11/23/14 1037         Psychosocial Re-Evaluation   Interventions Stress management education;Encouraged to attend Cardiac Rehabilitation for the exercise Encouraged to attend Cardiac Rehabilitation for the exercise      Comments Mr. Cambre has rheumatoid arthritis which is a stressor and makes things harder for him incl mowing his yard. Was able to mow 1/2 of it without stopping which is better.  I encouraged "[name]" to think of his granddaughter when he is thinking about smoking his afternoon cigarette when he is stressed or exercise.          Vocational Rehabilitation: Provide vocational rehab assistance to qualifying candidates.   Vocational Rehab Evaluation & Intervention:     Vocational Rehab - 09/26/14 1158    Initial Vocational Rehab Evaluation & Intervention   Assessment shows need for Vocational Rehabilitation No      Education: Education Goals: Education classes will be provided on a weekly basis, covering required topics. Participant will state understanding/return demonstration of topics presented.  Learning Barriers/Preferences:     Learning Barriers/Preferences - 09/26/14 1158    Learning Barriers/Preferences   Learning Barriers None   Learning Preferences None      Education Topics: General Nutrition Guidelines/Fats  and Fiber: -Group instruction provided by verbal, written material, models and posters to present the general guidelines for heart healthy nutrition. Gives an explanation and review of dietary fats and fiber.          Cardiac Rehab from 11/21/2014 in [facility] Cardiac Rehab   Date  10/17/14   Educator  PI   Instruction Review Code  2- meets goals/outcomes      Controlling Sodium/Reading Food Labels: -Group verbal and written material supporting the discussion of sodium use in heart healthy nutrition. Review and explanation with models, verbal and written materials for utilization of the food label.      Cardiac Rehab from 11/21/2014 in [facility] Cardiac Rehab   Date  10/31/14   Educator  CR   Instruction Review Code  2- meets goals/outcomes      Exercise Physiology & Risk Factors: - Group verbal and written instruction with models to review the exercise physiology of the cardiovascular system and associated critical values. Details cardiovascular disease risk factors and the goals associated with each risk factor.      Cardiac Rehab from 11/21/2014 in [facility] Cardiac Rehab   Date  11/09/14   Educator  RM   Instruction Review Code  2- meets goals/outcomes      Aerobic Exercise & Resistance Training: - Gives group verbal and written discussion on the health impact of inactivity. On the components of aerobic and resistive training programs and the benefits of this training and how to safely progress through these programs.      Cardiac Rehab from 11/21/2014 in [facility] Cardiac Rehab   Date  11/14/14   Educator  RM   Instruction Review Code  2- meets goals/outcomes      Flexibility, Balance, General Exercise  Guidelines: - Provides group verbal and written instruction on the benefits of flexibility and balance training programs. Provides general exercise guidelines with specific guidelines to those with heart or lung disease. Demonstration and skill practice provided.      Cardiac Rehab from  11/21/2014 in [facility] Cardiac Rehab   Date  11/21/14   Educator  RM   Instruction Review Code  2- meets goals/outcomes      Stress Management: - Provides group verbal and written instruction about the health risks of elevated stress, cause of high stress, and healthy ways to reduce stress.   Depression: - Provides group verbal and written instruction on the correlation between heart/lung disease and depressed mood, treatment options, and the stigmas associated with seeking treatment.      Cardiac Rehab from 11/21/2014 in [facility] Cardiac Rehab   Date  11/02/14   Educator  [facility]   Instruction Review Code  2- meets goals/outcomes      Anatomy & Physiology of the Heart: - Group verbal and written instruction and models provide basic cardiac anatomy and physiology, with the coronary electrical and arterial systems. Review of: AMI, Angina, Valve disease, Heart Failure, Cardiac Arrhythmia, Pacemakers, and the ICD.      Cardiac Rehab from 11/21/2014 in [facility] Cardiac Rehab   Date  10/03/14   Educator  SB   Instruction Review Code  2- meets goals/outcomes      Cardiac Procedures: - Group verbal and written instruction and models to describe the testing methods done to diagnose heart disease. Reviews the outcomes of the test results. Describes the treatment choices: Medical Management, Angioplasty, or Coronary Bypass Surgery.      Cardiac Rehab from 11/21/2014 in [facility] Cardiac Rehab   Date  10/24/14   Educator  SB   Instruction Review Code  2- meets goals/outcomes      Cardiac Medications: - Group verbal and written instruction to review commonly prescribed medications for heart disease. Reviews the medication, class of the drug, and side effects. Includes the steps to properly store meds and maintain the prescription regimen.      Cardiac Rehab from 11/21/2014 in [facility] Cardiac Rehab   Date  11/07/14   Educator  SB   Instruction Review Code  2- meets goals/outcomes      Go Sex-Intimacy &  Heart Disease, Get SMART - Goal Setting: - Group verbal and written instruction through game format to discuss heart disease and the return to sexual intimacy. Provides group verbal and written material to discuss and apply goal setting through the application of the S.M.A.R.T. Method.      Cardiac Rehab from 11/21/2014 in [facility] Cardiac Rehab   Date  10/24/14   Educator  SB   Instruction Review Code  2- meets goals/outcomes      Other Matters of the Heart: - Provides group verbal, written materials and models to describe Heart Failure, Angina, Valve Disease, and Diabetes in the realm of heart disease. Includes description of the disease process and treatment options available to the cardiac patient.      Cardiac Rehab from 11/21/2014 in [facility] Cardiac Rehab   Date  10/12/14   Educator  [place]   Instruction Review Code  2- meets goals/outcomes      Exercise & Equipment Safety: - Individual verbal instruction and demonstration of equipment use and safety with use of the equipment.      Cardiac Rehab from 11/21/2014 in [facility] Cardiac Rehab   Date  09/26/14   Educator  [place]   Instruction Review Code  2- meets goals/outcomes      Infection Prevention: - Provides verbal and written material to individual with discussion of infection control including proper hand washing and proper equipment cleaning during exercise session.      Cardiac Rehab from 11/21/2014 in [facility] Cardiac Rehab   Date  09/26/14   Educator  [place]   Instruction Review Code  2- meets goals/outcomes      Falls Prevention: - Provides verbal and written material to individual with discussion of falls prevention and safety.      Cardiac Rehab from 11/21/2014 in [facility] Cardiac Rehab   Date  09/26/14   Educator  [place]   Instruction Review Code  2- meets goals/outcomes      Diabetes: - Individual verbal and written instruction to review signs/symptoms of diabetes, desired ranges of glucose level fasting, after meals and with exercise.  Advice that pre and post exercise glucose checks will be done for 3 sessions at entry of program.    Knowledge Questionnaire Score:     Knowledge Questionnaire Score - 11/16/14 1758    Knowledge Questionnaire Score   Post Score 25      Personal Goals and Risk Factors at Admission:     Personal Goals and Risk Factors at Admission - 09/26/14 1202    Personal Goals and Risk Factors on Admission   Increase Aerobic Exercise and Physical Activity Yes;Sedentary   Intervention While in program, learn and follow the exercise prescription taught. Start at a low level workload and increase workload after able to maintain previous level for 30 minutes. Increase time before increasing intensity.   Intervention Provide exercise education and an individualized exercise prescription that will provide continued progressive overload as per policy without signs/symptoms of physical distress.   Quit Smoking Yes   Number of packs per day 3 to 4 cigarettes a day   Intervention Utilize your health care professional team to help with smoking cessation while in the program. Your doctor can prescribe medications to aid in cessation. The program can provide information and counseling as needed.   Take Less Medication Yes   Intervention Learn your risk factors and begin the lifestyle modifications for risk factor control during your time in the program.   Diabetes No   Hypertension Yes   Goal Participant will see blood pressure controlled within the values of 140/30m/Hg or within value directed by their physician.   Intervention Provide nutrition & aerobic exercise along with prescribed medications to achieve BP 140/90 or less.   Lipids Yes   Goal Cholesterol controlled with medications as prescribed, with individualized exercise RX and with personalized nutrition plan. Value goals: LDL < 77m HDL > 4063mParticipant states understanding of desired cholesterol values and following prescriptions.   Intervention  Provide nutrition & aerobic exercise along with prescribed medications to achieve LDL <12m87mDL >40mg18m   Personal Goals and Risk Factors Review:      Goals and Risk Factor Review      10/19/14 1201 10/21/14 0937 [id number] 11/02/14 1200 11/23/14 1029   Increase Aerobic Exercise and Physical Activity   Goals Progress/Improvement seen  Yes Yes Yes  Yes   Comments Mr. ParkeSchnabelrheumatoid arthritis which is a stressor and makes things harder for him incl mowing his yard. Was able to mow 1/2 of it without stopping which is better [name] iWille Glasermproving on all of the exercise equipment and is up to 2.7mph 12mthe  treadmill. He is very compliant with exercise increases and he is encouraged by all the progress he has made so far on the equipment.  Making significant gains on equipment, however, still experiencing some leg pain due to medications.     Quit Smoking   Goals Progress/Improvement seen    Yes;No    Comments    [name] continues to smoke 1-3 cigarettes a day. He is not ready to quit yet. We did review options in the community when he is ready to quit. Introduced Lobbyist to him.    Hypertension   Goal --  Mr. Deiss blood pressure is well controlled.   Participant will see blood pressure controlled within the values of 140/90m/Hg or within value directed by their physician. Participant will see blood pressure controlled within the values of 140/967mHg or within value directed by their physician.    Progress seen toward goals    Yes    Comments    [name] is aware of his BP readings and is maintianing good numbers. He is watching sodium intake and eating as healthy as he can.    Abnormal Lipids   Goal   Cholesterol controlled with medications as prescribed, with individualized exercise RX and with personalized nutrition plan. Value goals: LDL < 7036mHDL > 40m21marticipant states understanding of desired cholesterol values and following prescriptions.  medication side effects, still  monitoring blood panel for changies Cholesterol controlled with medications as prescribed, with individualized exercise RX and with personalized nutrition plan. Value goals: LDL < 70mg17mL > 40mg.23mticipant states understanding of desired cholesterol values and following prescriptions.    Progress seen towards goals    Unknown    Comments    [name] haWille Glaserot had a labs recently. He does not like taking the medication for cholesterol control as it causes muscle aches off and on. His doctor is aware of the symptoms and has made several changes in the medication . [name] continues to have the muscle soreness and is living with it. The soreness is not consistent.      11/23/14 1034 11/25/14 1004         Increase Aerobic Exercise and Physical Activity   Goals Progress/Improvement seen  Yes Yes      Comments [name]        Quit Smoking   Goals Progress/Improvement seen Yes No      Comments HOpes to cut back on those 2 cigarettes a day. [name] reports he smokes one in the am after breakfast then one in the afternoon if he is stressed. I encouraged him to cut back on the afternoon one by thinking he wanted to see his granddaughter that he helps with alot graduate from college.        Hypertension   Progress seen toward goals Yes Yes         Personal Goals Discharge (Final Personal Goals and Risk Factors Review):      Goals and Risk Factor Review - 11/25/14 1004    Increase Aerobic Exercise and Physical Activity   Goals Progress/Improvement seen  Yes   Quit Smoking   Goals Progress/Improvement seen No   Hypertension   Progress seen toward goals Yes       Comments: Has completetd 36/36 sessions of Cardiac Rehab!

## 2014-11-25 NOTE — Progress Notes (Signed)
Discharge Summary  Patient Details  Name: Martin Adkins MRN: [id number] Date of Birth: 21-Aug-1945 Referring Provider:  Corey Skains, MD   Number of Visits: 36/36  Reason for Discharge:  Patient reached a stable level of exercise. Patient independent in their exercise.  Smoking History:  History  Smoking status  . Current Some Day Smoker -- 0.25 packs/day for 20 years  . Types: Cigarettes  Smokeless tobacco  . 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 coronary artery of inferior wall (HCC)  ADL UCSD:   Initial Exercise Prescription:     Initial Exercise Prescription - 09/26/14 1300    Date of Initial Exercise Prescription   Date 09/26/14   Treadmill   MPH 2.5   Grade 0   Minutes 15   Bike   Level 1   Minutes 15   Recumbant Bike   Level 2   Watts 30   Minutes 15   NuStep   Level 3   Watts 40   Minutes 15   Arm Ergometer   Level 1   Watts 10   Minutes 15   Arm/Foot Ergometer   Level 1   Watts 15   Minutes 15   Cybex   Level 2   RPM 20   Minutes 15   Recumbant Elliptical   Level 2   RPM 40   Watts 20   Minutes 15   Elliptical   Level 1   Speed 2.5   Minutes 5   REL-XR   Level 3   Watts 40   Minutes 15   Prescription Details   Frequency (times per week) 3   Duration Progress to 30 minutes of continuous aerobic without signs/symptoms of physical distress   Intensity   THRR REST +  30   Ratings of Perceived Exertion 11-13   Progression Continue progressive overload as per policy without signs/symptoms or physical distress.   Resistance Training   Training Prescription Yes   Weight 2   Reps 10-12      Discharge Exercise Prescription (Final Exercise Prescription Changes):     Exercise Prescription Changes - 11/25/14 0900    Exercise Review   Progression Yes   Response to Exercise   Symptoms None   Comments Patient is all set for intake into the fitness center. Last day of  exercise was today. Reviewed all exercise goals and disucussed continuation of exercise as part of new lifestyle from now on.   Duration Progress to 50 minutes of aerobic without signs/symptoms of physical distress   Intensity Rest + 30   Progression Continue progressive overload as per policy without signs/symptoms or physical distress.   Resistance Training   Training Prescription Yes   Weight 3   Reps 10-15   Interval Training   Interval Training Yes   Equipment Recumbant [id number]   Comments HIIT was explained to the patient who wanted to begin with it on the [place].  Stated full understanding and demonstrated well.   Treadmill   MPH 3   Grade 1   Minutes 20   Recumbant Bike   Level 12   RPM 45   Watts 80   Minutes 20   NuStep   Level 6  T5   Watts 40   Minutes 20   Recumbant Elliptical   Level 3  BioStep   Watts 30   Minutes 20   REL-XR   Level 12  Watts 100   Minutes 25   Home Exercise Plan   Plans to continue exercise at [place]:     6 Minute Walk      09/26/14 1259 11/18/14 0820     6 Minute Walk   Phase Initial Discharge    Distance 1330 feet 1790 feet    Distance % Change  35 %    Walk Time 6 minutes 6 minutes    Resting HR 74 bpm 84 bpm    Resting BP 112/78 mmHg 118/80 mmHg    Max Ex. HR 89 bpm 102 bpm    Max Ex. BP 130/70 mmHg 124/80 mmHg    RPE 11 13    Symptoms No No       Psychological, QOL, Others - Outcomes: PHQ 2/9: Depression screen [facility] 2/9 11/16/2014 09/26/2014  Decreased Interest 2 1  Down, Depressed, Hopeless 0 0  PHQ - 2 Score 2 1  Altered sleeping 0 1  Tired, decreased energy 2 1  Change in appetite 0 0  Feeling bad or failure about yourself  0 0  Trouble concentrating - 0  Moving slowly or fidgety/restless 0 0  Suicidal thoughts 0 0  PHQ-9 Score 4 3  Difficult doing work/chores Somewhat difficult Somewhat difficult    Quality of Life:     Quality of Life - 11/16/14 1757     Quality of Life Scores   Health/Function Post 24 %   Health/Function % Change 1 %   Socioeconomic Post 26.57 %   Socioeconomic % Change -8 %   Psych/Spiritual Post 24.86 %   Psych/Spiritual % Change -3 %   Family Post 27.3 %   Family % Change 14 %   GLOBAL Post 25.27 %   GLOBAL % Change 2 %      Personal Goals: Goals established at orientation with interventions provided to work toward goal.     Personal Goals and Risk Factors at Admission - 09/26/14 1202    Personal Goals and Risk Factors on Admission   Increase Aerobic Exercise and Physical Activity Yes;Sedentary   Intervention While in program, learn and follow the exercise prescription taught. Start at a low level workload and increase workload after able to maintain previous level for 30 minutes. Increase time before increasing intensity.   Intervention Provide exercise education and an individualized exercise prescription that will provide continued progressive overload as per policy without signs/symptoms of physical distress.   Quit Smoking Yes   Number of packs per day 3 to 4 cigarettes a day   Intervention Utilize your health care professional team to help with smoking cessation while in the program. Your doctor can prescribe medications to aid in cessation. The program can provide information and counseling as needed.   Take Less Medication Yes   Intervention Learn your risk factors and begin the lifestyle modifications for risk factor control during your time in the program.   Diabetes No   Hypertension Yes   Goal Participant will see blood pressure controlled within the values of 140/58mm/Hg or within value directed by their physician.   Intervention Provide nutrition & aerobic exercise along with prescribed medications to achieve BP 140/90 or less.   Lipids Yes   Goal Cholesterol controlled with medications as prescribed, with individualized exercise RX and with personalized nutrition plan. Value goals: LDL < 70mg , HDL  > 40mg . Participant states understanding of desired cholesterol values and following prescriptions.   Intervention  Provide nutrition & aerobic exercise along with prescribed medications to achieve LDL 70mg , HDL >40mg .       Personal Goals Discharge:     Goals and Risk Factor Review      10/19/14 1201 10/21/14 [id number] 11/02/14 0929 11/02/14 1200 11/23/14 1029   Increase Aerobic Exercise and Physical Activity   Goals Progress/Improvement seen  Yes Yes Yes  Yes   Comments Mr. Ugalde has rheumatoid arthritis which is a stressor and makes things harder for him incl mowing his yard. Was able to mow 1/2 of it without stopping which is better [name] is improving on all of the exercise equipment and is up to 2.14mph on the treadmill. He is very compliant with exercise increases and he is encouraged by all the progress he has made so far on the equipment.  Making significant gains on equipment, however, still experiencing some leg pain due to medications.     Quit Smoking   Goals Progress/Improvement seen    Yes;No    Comments    [name] continues to smoke 1-3 cigarettes a day. He is not ready to quit yet. We did review options in the community when he is ready to quit. Introduced Lobbyist to him.    Hypertension   Goal --  Mr. Guevarra blood pressure is well controlled.   Participant will see blood pressure controlled within the values of 140/77mm/Hg or within value directed by their physician. Participant will see blood pressure controlled within the values of 140/16mm/Hg or within value directed by their physician.    Progress seen toward goals    Yes    Comments    [name] is aware of his BP readings and is maintianing good numbers. He is watching sodium intake and eating as healthy as he can.    Abnormal Lipids   Goal   Cholesterol controlled with medications as prescribed, with individualized exercise RX and with personalized nutrition plan. Value goals: LDL < 70mg , HDL > 40mg . Participant states  understanding of desired cholesterol values and following prescriptions.  medication side effects, still monitoring blood panel for changies Cholesterol controlled with medications as prescribed, with individualized exercise RX and with personalized nutrition plan. Value goals: LDL < 70mg , HDL > 40mg . Participant states understanding of desired cholesterol values and following prescriptions.    Progress seen towards goals    Unknown    Comments    [name] has not had a labs recently. He does not like taking the medication for cholesterol control as it causes muscle aches off and on. His doctor is aware of the symptoms and has made several changes in the medication . [name] continues to have the muscle soreness and is living with it. The soreness is not consistent.      11/23/14 1034 11/25/14 1004         Increase Aerobic Exercise and Physical Activity   Goals Progress/Improvement seen  Yes Yes      Comments "[name]" Alfio will exercise in [place] has already made him a follow up orientation appt for there.        Quit Smoking   Goals Progress/Improvement seen Yes No      Comments HOpes to cut back on those 2 cigarettes a day. [name] reports he smokes one in the am after breakfast then one in the afternoon if he is stressed. I encouraged him to cut back on the afternoon one by thinking he wanted to see his granddaughter that he helps with  alot graduate from college.        Hypertension   Progress seen toward goals Yes Yes         Nutrition & Weight - Outcomes:     Pre Biometrics - 09/26/14 1307    Pre Biometrics   Height 6' (1.829 m)   Weight 211 lb 9.6 oz (95.981 kg)   Waist Circumference 42.75 inches   Hip Circumference 41 inches   Waist to Hip Ratio 1.04 %   BMI (Calculated) 28.8         Post Biometrics - 11/18/14 0820     Post  Biometrics   Height 6' (1.829 m)   Weight 211 lb (95.709 kg)   Waist Circumference 41.5 inches   Hip Circumference 40 inches   Waist to Hip  Ratio 1.04 %   BMI (Calculated) 28.7      Nutrition:   Nutrition Discharge:     Rate Your Plate - [id number]    Rate Your Plate Scores   Post Score 73   Post Score % 81 %      Education Questionnaire Score:     Knowledge Questionnaire Score - 11/16/14 1758    Knowledge Questionnaire Score   Post Score 25      Goals reviewed with patient; copy given to patient.

## 2014-11-25 NOTE — Progress Notes (Signed)
11/25/2014 10:32 AM   Martin Adkins 07-19-1945 [id number]  Referring provider: Madelyn Brunner, MD [place] [facility] [place], [place] [address]  Chief Complaint  Patient presents with  . Urinary Incontinence    rule out BOO    HPI: Patient is a 69 year old African American male who was initiated on Myrbetriq 50 mg daily 6 weeks ago for his urge incontinence. He presents today to discuss his response to the medication.   He states he noted no difference while taking the Myrbetriq 50 mg. His IPSS score today was 8/5. His PVR was 29 mL. He is not experiencing any dysuria, gross hematuria or suprapubic pain. I reviewed his liquid intake and diet and he is does not appear to be consuming foods or liquids that would irritate his bladder.   He has not found any relief with tamsulosin or finasteride that was prescribed by his PCP. He did not find any relief with the oxybutynin or Myrbetriq. He is having two to three urge incontinence episodes during the day and two to three at night. He is not having a large incontinence volume, as it only leaves a small wet spot in his underwear.   He states he feels fine for the exception of the urge incontinence.   Interval History: The patient presents for cystoscopy.   PMH: Past Medical History  Diagnosis Date  . Hyperlipemia   . RA (rheumatoid arthritis) ([place])   . OSA (obstructive sleep apnea)   . PVD (peripheral vascular disease) ([place])   . GERD (gastroesophageal reflux disease)   . Osteoarthritis   . NSTEMI (non-ST elevated myocardial infarction) ([place])   . Hyperplastic polyps of stomach   . Barrett's esophagus   . Diabetes ([place])   . DVT (deep venous thrombosis) ([place])   . Abdominal aortic aneurysm without rupture ([place])   . CAD (coronary artery disease)   . SOB (shortness of breath)   . CHF (congestive heart failure) ([place])   . Hypertension   . Arthritis   . BPH (benign prostatic hyperplasia)     . AAA (abdominal aortic aneurysm) without rupture ([place]) 04/05/2014  . Benign essential HTN 05/02/2014  . Arteriosclerosis of coronary artery 04/05/2014    Overview:  Sp cabg with lima to lad svg to d1 om1 rca 2004 with occluded svg to rca and d1 and pci stetn of om1 graft 2015   . Chronic systolic heart failure ([place]) 04/19/2014    Overview:  With segmental LV systolic dysfunction ejection fraction of 35%   . Antepartum deep phlebothrombosis ([place]) 07/28/2014  . Acute non-ST elevation myocardial infarction (NSTEMI) ([place]) 08/15/2014  . Myocardial infarction [facility]) 07/30/2014    Surgical History: Past Surgical History  Procedure Laterality Date  . Coronary angioplasty with stent placement  2015  . Coronary artery bypass graft    . Abdominal aorta stent    . Peripheral vascular catheterization N/A 09/06/2014    Procedure: IVC Filter Removal;  Surgeon: Katha Cabal, MD;  Location: [facility];  Service: Cardiovascular;  Laterality: N/A;    Home Medications:    Medication List       This list is accurate as of: 11/25/14 10:32 AM.  Always use your most recent med list.               amLODipine 10 MG tablet  Commonly known as:  NORVASC     aspirin EC 81 MG tablet  Take by mouth.  aspirin 81 MG tablet  Take 1 tablet by mouth daily.     atorvastatin 80 MG tablet  Commonly known as:  LIPITOR  TAKE 1 TABLET (80 MG TOTAL) BY MOUTH ONCE DAILY.     benazepril 20 MG tablet  Commonly known as:  LOTENSIN  Take 1 tablet by mouth daily.     chlorthalidone 25 MG tablet  Commonly known as:  HYGROTON  Take 1 tablet by mouth daily.     clopidogrel 75 MG tablet  Commonly known as:  PLAVIX  TAKE 1 TABLET (75 MG TOTAL) BY MOUTH ONCE DAILY.     fluticasone 50 MCG/ACT nasal spray  Commonly known as:  FLONASE     HCA TRIPLE ANTIBIOTIC OINTMENT EX     Heat Therapy Patches Misc     hydroxychloroquine 200 MG tablet  Commonly known as:  PLAQUENIL     isosorbide mononitrate 30  MG 24 hr tablet  Commonly known as:  IMDUR     LUBRICANT DROPS OP  Place 1 drop into both eyes daily.     LUBRICANT EYE DROPS 0.4-0.3 % Soln  Generic drug:  Polyethyl Glycol-Propyl Glycol     metoprolol succinate 100 MG 24 hr tablet  Commonly known as:  TOPROL-XL     mirabegron ER 25 MG Tb24 tablet  Commonly known as:  MYRBETRIQ  Take 1 tablet (25 mg total) by mouth daily.     Naphazoline-Polyethyl Glycol 0.012-0.2 % Soln  Place 1 drop into both eyes daily.     NASAL DECONGESTANT SPRAY 0.05 % nasal spray  Generic drug:  oxymetazoline     pantoprazole 40 MG tablet  Commonly known as:  PROTONIX     potassium chloride SA 20 MEQ tablet  Commonly known as:  K-DUR,KLOR-CON     valsartan 160 MG tablet  Commonly known as:  DIOVAN  Take 160 mg by mouth daily.     warfarin 2.5 MG tablet  Commonly known as:  COUMADIN  2.5 mg. 5 mg one day alternate with 7 mg next day     warfarin 5 MG tablet  Commonly known as:  COUMADIN  TAKE 1 TABLET (5 MG TOTAL) BY MOUTH ONCE DAILY.        Allergies:  Allergies  Allergen Reactions  . Ace Inhibitors Other (See Comments)    Other reaction(s): Cough  . Niacin Other (See Comments)  . Pravastatin Other (See Comments)    Other reaction(s): Muscle Pain  . Rosuvastatin Other (See Comments)    Other reaction(s): Other (See Comments) GI bleed  . Simvastatin Other (See Comments)    Other reaction(s): Muscle Pain  . Amoxicillin Rash  . Penicillins Rash    Family History: Family History  Problem Relation Age of Onset  . Bladder Cancer Neg Hx   . Prostate cancer Neg Hx   . Kidney cancer Neg Hx     Social History:  reports that he has been smoking Cigarettes.  He has a 5 pack-year smoking history. He does not have any smokeless tobacco history on file. He reports that he drinks alcohol. He reports that he does not use illicit drugs.  ROS: UROLOGY Frequent Urination?: No Hard to postpone urination?: Yes Burning/pain with urination?:  No Get up at night to urinate?: No Leakage of urine?: Yes Urine stream starts and stops?: No Trouble starting stream?: No Do you have to strain to urinate?: No Blood in urine?: No Urinary tract infection?: No Sexually transmitted disease?: No Injury to kidneys  or bladder?: No Painful intercourse?: No Weak stream?: No Erection problems?: No Penile pain?: No  Gastrointestinal Nausea?: No Vomiting?: No Indigestion/heartburn?: No Diarrhea?: No Constipation?: No  Constitutional Fever: No Night sweats?: No Weight loss?: No Fatigue?: No  Skin Skin rash/lesions?: No Itching?: No  Eyes Blurred vision?: No Double vision?: No  Ears/Nose/Throat Sore throat?: No Sinus problems?: No  Hematologic/Lymphatic Swollen glands?: No Easy bruising?: No  Cardiovascular Leg swelling?: No Chest pain?: No  Respiratory Cough?: No Shortness of breath?: No  Endocrine Excessive thirst?: No  Musculoskeletal Back pain?: No Joint pain?: No  Neurological Headaches?: No Dizziness?: No  Psychologic Depression?: No Anxiety?: No  Physical Exam: BP 120/75 mmHg  Pulse 75  Ht 6' (1.829 m)  Wt 212 lb 6.4 oz (96.344 kg)  BMI 28.80 kg/m2  Constitutional:  Alert and oriented, No acute distress. HEENT: [place] AT, moist mucus membranes.  Trachea midline, no masses. Cardiovascular: No clubbing, cyanosis, or edema. Respiratory: Normal respiratory effort, no increased work of breathing. GI: Abdomen is soft, nontender, nondistended, no abdominal masses GU: No CVA tenderness.  Skin: No rashes, bruises or suspicious lesions. Lymph: No cervical or inguinal adenopathy. Neurologic: Grossly intact, no focal deficits, moving all 4 extremities. Psychiatric: Normal mood and affect.  Laboratory Data: Lab Results  Component Value Date   WBC 10.0 07/30/2014   HGB 15.4 07/30/2014   HCT 47.4 07/30/2014   MCV 79.6* 07/30/2014   PLT 189 07/30/2014    Lab Results  Component Value Date    CREATININE 1.06 07/30/2014    No results found for: PSA  No results found for: TESTOSTERONE  No results found for: HGBA1C  Urinalysis    Component Value Date/Time   COLORURINE Yellow 07/09/2011 0147   APPEARANCEUR Clear 07/09/2011 0147   LABSPEC 1.023 07/09/2011 0147   PHURINE 7.0 07/09/2011 0147   GLUCOSEU Negative 07/28/2014 0917   GLUCOSEU Negative 07/09/2011 0147   HGBUR Negative 07/09/2011 0147   BILIRUBINUR Negative 07/28/2014 0917   BILIRUBINUR Negative 07/09/2011 0147   KETONESUR Negative 07/09/2011 0147   PROTEINUR Negative 07/09/2011 0147   NITRITE Negative 07/28/2014 0917   NITRITE Negative 07/09/2011 0147   LEUKOCYTESUR Negative 07/28/2014 0917   LEUKOCYTESUR Negative 07/09/2011 0147     Cystoscopy Procedure Note  Patient identification was confirmed, informed consent was obtained, and patient was prepped using Betadine solution.  Lidocaine jelly was administered per urethral meatus.    Preoperative abx where received prior to procedure.     Pre-Procedure: - Inspection reveals a normal caliber ureteral meatus.  Procedure: The flexible cystoscope was introduced without difficulty - No urethral strictures/lesions are present. - Enlarged prostate with trilobar hypertrophy and visually obstructed - Normal bladder neck - Bilateral ureteral orifices identified - Bladder mucosa  reveals no ulcers, tumors, or lesions - No bladder stones - No trabeculation  Retroflexion shows intravesical lobe   Post-Procedure: - Patient tolerated the procedure well    Assessment & Plan:    1. Urge incontinence of urine: Patient did not find any relief from the urge incontinence with tamsulosin/finasteride, oxybutynin or Myrbetriq. He does have a visually obstructive prostate on cystoscopy, but he does not have any obstructive voiding symptoms and his PVR is 48 cc. We will set him up for UDS to further evaluate his bladder. He will follow up after his UDS.  2. Other  male erectile dysfunction History of refractory erectile dysfunction, not currently a candidate for PDE 5 inhibitors. May discuss intracavernosal injections in the future.  3.  Peyronie disease Stable curvature, minimal bother.  Return in about 4 weeks (around 12/23/2014) for after urodynamic studies in [place].  Nickie Retort, MD  [facility] [address], [place], [place] [address] [phone]

## 2014-11-25 NOTE — Progress Notes (Signed)
Bladder Scan Patient  void: 48 ml Performed By: Larna Daughters

## 2014-11-25 NOTE — Addendum Note (Signed)
Addended by: Gerlene Burdock on: 11/25/2014 10:08 AM   Modules accepted: Orders

## 2014-11-25 NOTE — Progress Notes (Signed)
Daily Session Note  Patient Details  Name: Martin Adkins MRN: [id number] Date of Birth: [birth year] Referring Provider:  Corey Skains, MD  Encounter Date: 11/25/2014  Check In:     Session Check In - 11/25/14 0943    Check-In   Staff Present Candiss Norse MS, ACSM CEP Exercise Physiologist;Carroll Enterkin RN, BSN;Susanne Bice RN, BSN, [place]   ER physicians immediately available to respond to emergencies See telemetry face sheet for immediately available ER MD   Medication changes reported     No   Fall or balance concerns reported    No   Warm-up and Cool-down Performed on first and last piece of equipment   VAD Patient? No   Pain Assessment   Currently in Pain? No/denies   Multiple Pain Sites No           Exercise Prescription Changes - 11/25/14 0900    Exercise Review   Progression Yes   Response to Exercise   Symptoms None   Comments Patient is all set for intake into the fitness center. Last day of exercise was today. Reviewed all exercise goals and disucussed continuation of exercise as part of new lifestyle from now on.   Duration Progress to 50 minutes of aerobic without signs/symptoms of physical distress   Intensity Rest + 30   Progression Continue progressive overload as per policy without signs/symptoms or physical distress.   Resistance Training   Training Prescription Yes   Weight 3   Reps 10-15   Interval Training   Interval Training Yes   Equipment Recumbant [id number]   Comments HIIT was explained to the patient who wanted to begin with it on the [place].  Stated full understanding and demonstrated well.   Treadmill   MPH 3   Grade 1   Minutes 20   Recumbant Bike   Level 12   RPM 45   Watts 80   Minutes 20   NuStep   Level 6  T5   Watts 40   Minutes 20   Recumbant Elliptical   Level 3  BioStep   Watts 30   Minutes 20   REL-XR   Level 12   Watts 100   Minutes 25   Home Exercise Plan   Plans to continue exercise at [place] with exercise equipment Exercise tolerated well Personal goals reviewed No report of cardiac concerns or symptoms Strength training completed today  Goals Unmet:  Not Applicable  Goals Comments: Last day of exercise   Dr. Emily Filbert is Medical Director for [facility].

## 2014-12-24 DIAGNOSIS — I10 Essential (primary) hypertension: Secondary | ICD-10-CM | POA: Insufficient documentation

## 2014-12-24 DIAGNOSIS — F17210 Nicotine dependence, cigarettes, uncomplicated: Secondary | ICD-10-CM | POA: Insufficient documentation

## 2014-12-24 DIAGNOSIS — Z7901 Long term (current) use of anticoagulants: Secondary | ICD-10-CM | POA: Diagnosis not present

## 2014-12-24 DIAGNOSIS — Z23 Encounter for immunization: Secondary | ICD-10-CM | POA: Diagnosis not present

## 2014-12-24 DIAGNOSIS — S20212A Contusion of left front wall of thorax, initial encounter: Secondary | ICD-10-CM | POA: Insufficient documentation

## 2014-12-24 DIAGNOSIS — W01198A Fall on same level from slipping, tripping and stumbling with subsequent striking against other object, initial encounter: Secondary | ICD-10-CM | POA: Diagnosis not present

## 2014-12-24 DIAGNOSIS — Y998 Other external cause status: Secondary | ICD-10-CM | POA: Diagnosis not present

## 2014-12-24 DIAGNOSIS — S01412A Laceration without foreign body of left cheek and temporomandibular area, initial encounter: Secondary | ICD-10-CM | POA: Insufficient documentation

## 2014-12-24 DIAGNOSIS — Y9389 Activity, other specified: Secondary | ICD-10-CM | POA: Insufficient documentation

## 2014-12-24 DIAGNOSIS — E119 Type 2 diabetes mellitus without complications: Secondary | ICD-10-CM | POA: Diagnosis not present

## 2014-12-24 DIAGNOSIS — Y92000 Kitchen of unspecified non-institutional (private) residence as  the place of occurrence of the external cause: Secondary | ICD-10-CM | POA: Insufficient documentation

## 2014-12-24 DIAGNOSIS — Z7982 Long term (current) use of aspirin: Secondary | ICD-10-CM | POA: Diagnosis not present

## 2014-12-24 DIAGNOSIS — Z79899 Other long term (current) drug therapy: Secondary | ICD-10-CM | POA: Insufficient documentation

## 2014-12-24 DIAGNOSIS — Z880 Allergy status to penicillin: Secondary | ICD-10-CM | POA: Insufficient documentation

## 2014-12-24 NOTE — ED Notes (Signed)
Pt states slipped on rug this pm striking face to kitchen counter top. Pt with 2 inch linear laceration to base of left eye, bleeding controlled with dressing. Pt is on plavix. Pt denies visual change to eye, perrl 78mm and brisk. Pt denies loc, denies neck pain, cms intact in all extremities. Pt also complains of left low back pain after falling.

## 2014-12-25 ENCOUNTER — Emergency Department: Payer: Medicare PPO

## 2014-12-25 ENCOUNTER — Emergency Department
Admission: EM | Admit: 2014-12-25 | Discharge: 2014-12-25 | Disposition: A | Discharge status: Home / Self Care | Payer: Medicare PPO | Attending: Emergency Medicine | Admitting: Emergency Medicine

## 2014-12-25 ENCOUNTER — Encounter: Payer: Self-pay | Admitting: Emergency Medicine

## 2014-12-25 DIAGNOSIS — S0181XA Laceration without foreign body of other part of head, initial encounter: Secondary | ICD-10-CM

## 2014-12-25 DIAGNOSIS — S0083XA Contusion of other part of head, initial encounter: Secondary | ICD-10-CM

## 2014-12-25 DIAGNOSIS — S20212A Contusion of left front wall of thorax, initial encounter: Secondary | ICD-10-CM

## 2014-12-25 MED ORDER — ACETAMINOPHEN 500 MG PO TABS
1000.0000 mg | ORAL_TABLET | Freq: Once | ORAL | Status: AC
  Administered 2014-12-25: 1000 mg via ORAL
  Filled 2014-12-25: qty 2

## 2014-12-25 MED ORDER — TETANUS-DIPHTH-ACELL PERTUSSIS 5-2.5-18.5 LF-MCG/0.5 IM SUSP
0.5000 mL | Freq: Once | INTRAMUSCULAR | Status: AC
  Administered 2014-12-25: 0.5 mL via INTRAMUSCULAR
  Filled 2014-12-25: qty 0.5

## 2014-12-25 MED ORDER — TRAMADOL HCL 50 MG PO TABS: 100.0000 mg | ORAL_TABLET | ORAL | Status: DC

## 2014-12-25 NOTE — ED Notes (Signed)
Laceration cleaned with sterile saline.  

## 2014-12-25 NOTE — ED Provider Notes (Signed)
[facility] Emergency Department Provider Note  ____________________________________________  Time seen: Approximately 4:34 AM  I have reviewed the triage vital signs and the nursing notes.   HISTORY  Chief Complaint Facial Laceration    HPI Martin Adkins is a 69 y.o. male who takes warfarin and Plavix among other medications and who presents with a laceration to the left cheek after mechanical fall which caused him to strike his face on the kitchen countertop.  He denies passing out and has no headache and no neck pain.  He is not sure exactly what happened but thinks he tripped over his slippers.  He is also complaining of point tenderness in his left lateral ribs that was not present before he fell.  He denies chest pain, shortness of breath, abdominal pain, nausea/vomiting, dysuria.The laceration is not actively bleeding at this time and his pain is well controlled as long as he is not moving or pressing on the left side of his ribs.  Severity of the wound is mild.   Past Medical History  Diagnosis Date  . Hyperlipemia   . RA (rheumatoid arthritis) ([place])   . OSA (obstructive sleep apnea)   . PVD (peripheral vascular disease) ([place])   . GERD (gastroesophageal reflux disease)   . Osteoarthritis   . NSTEMI (non-ST elevated myocardial infarction) ([place])   . Hyperplastic polyps of stomach   . Barrett's esophagus   . Diabetes ([place])   . DVT (deep venous thrombosis) ([place])   . Abdominal aortic aneurysm without rupture ([place])   . CAD (coronary artery disease)   . SOB (shortness of breath)   . CHF (congestive heart failure) ([place])   . Hypertension   . Arthritis   . BPH (benign prostatic hyperplasia)   . AAA (abdominal aortic aneurysm) without rupture ([place]) 04/05/2014  . Benign essential HTN 05/02/2014  . Arteriosclerosis of coronary artery 04/05/2014    Overview:  Sp cabg with lima to lad svg to d1 om1 rca 2004 with occluded svg to rca and d1 and pci stetn of  om1 graft 2015   . Chronic systolic heart failure ([place]) 04/19/2014    Overview:  With segmental LV systolic dysfunction ejection fraction of 35%   . Antepartum deep phlebothrombosis ([place]) 07/28/2014  . Acute non-ST elevation myocardial infarction (NSTEMI) ([place]) 08/15/2014  . Myocardial infarction [facility]) 07/30/2014    Patient Active Problem List   Diagnosis Date Noted  . Type 2 diabetes mellitus ([place]) 10/31/2014  . Benign gastric polyp 10/31/2014  . Absence of bladder continence 10/09/2014  . Acute non-ST elevation myocardial infarction (NSTEMI) ([place]) 08/15/2014  . Atrial fibrillation ([place]) 08/05/2014  . Antiphospholipid syndrome ([place]) 08/01/2014  . H/O deep venous thrombosis 08/01/2014  . Myocardial infarction ([place]) 07/30/2014  . Barrett esophagus 07/28/2014  . Diabetes mellitus, type 2 ([place]) 07/28/2014  . Antepartum deep phlebothrombosis ([place]) 07/28/2014  . Hyperplastic adenomatous polyp of stomach 07/28/2014  . Benign essential HTN 05/02/2014  . Chronic systolic heart failure ([place]) 04/19/2014  . AAA (abdominal aortic aneurysm) without rupture ([place]) 04/05/2014  . Arteriosclerosis of coronary artery 04/05/2014  . Breathlessness on exertion 04/05/2014  . Abdominal aortic aneurysm (AAA) without rupture ([place]) 04/05/2014  . Acute non-ST segment elevation myocardial infarction ([place]) 05/14/2013  . Acid reflux 05/04/2013  . Combined fat and carbohydrate induced hyperlipemia 05/03/2013  . Obstructive apnea 05/03/2013  . Arthritis, degenerative 05/03/2013  . Peripheral blood vessel disorder ([place]) 05/03/2013  . Arthritis or polyarthritis, rheumatoid ([place]) 05/03/2013  . Compulsive  tobacco user syndrome 05/03/2013  . Rheumatoid arthritis ([place]) 05/03/2013  . Peripheral vascular disease ([place]) 05/03/2013  . Current tobacco use 05/03/2013    Past Surgical History  Procedure Laterality Date  . Coronary angioplasty with stent placement  2015  . Coronary artery bypass graft    . Abdominal aorta  stent    . Peripheral vascular catheterization N/A 09/06/2014    Procedure: IVC Filter Removal;  Surgeon: Katha Cabal, MD;  Location: [facility];  Service: Cardiovascular;  Laterality: N/A;    Current Outpatient Rx  Name  Route  Sig  Dispense  Refill  . amLODipine (NORVASC) 10 MG tablet               . atorvastatin (LIPITOR) 80 MG tablet      TAKE 1 TABLET (80 MG TOTAL) BY MOUTH ONCE DAILY.      11   . clopidogrel (PLAVIX) 75 MG tablet      TAKE 1 TABLET (75 MG TOTAL) BY MOUTH ONCE DAILY.      11   . fluticasone (FLONASE) 50 MCG/ACT nasal spray               . hydroxychloroquine (PLAQUENIL) 200 MG tablet               . isosorbide mononitrate (IMDUR) 30 MG 24 hr tablet               . metoprolol succinate (TOPROL-XL) 100 MG 24 hr tablet               . pantoprazole (PROTONIX) 40 MG tablet               . potassium chloride SA (K-DUR,KLOR-CON) 20 MEQ tablet               . valsartan (DIOVAN) 160 MG tablet   Oral   Take 160 mg by mouth daily.         Marland Kitchen warfarin (COUMADIN) 2.5 MG tablet      2.5 mg at bedtime.          Marland Kitchen warfarin (COUMADIN) 5 MG tablet      TAKE 1 TABLET (5 MG TOTAL) BY MOUTH ONCE DAILY.      5   . aspirin 81 MG tablet   Oral   Take 1 tablet by mouth daily.         Marland Kitchen aspirin EC 81 MG tablet   Oral   Take by mouth.         . benazepril (LOTENSIN) 20 MG tablet   Oral   Take 1 tablet by mouth daily.         . chlorthalidone (HYGROTON) 25 MG tablet   Oral   Take 1 tablet by mouth daily.         Marland Kitchen Heat Wraps (HEAT THERAPY PATCHES) MISC               . LUBRICANT EYE DROPS 0.4-0.3 % SOLN                 Dispense as written.   . mirabegron ER (MYRBETRIQ) 25 MG TB24 tablet   Oral   Take 1 tablet (25 mg total) by mouth daily.   30 tablet   0   . Naphazoline-Polyethyl Glycol 0.012-0.2 % SOLN   Both Eyes   Place 1 drop into both eyes daily.         Marland Kitchen NASAL DECONGESTANT  SPRAY  0.05 % nasal spray                 Dispense as written.   Marland Kitchen Neomycin-Bacitracin-Polymyxin (HCA TRIPLE ANTIBIOTIC OINTMENT EX)               . Polyvinyl Alcohol (LUBRICANT DROPS OP)   Both Eyes   Place 1 drop into both eyes daily.           Allergies Ace inhibitors; Niacin; Pravastatin; Rosuvastatin; Simvastatin; Amoxicillin; and Penicillins  Family History  Problem Relation Age of Onset  . Bladder Cancer Neg Hx   . Prostate cancer Neg Hx   . Kidney cancer Neg Hx     Social History Social History  Substance Use Topics  . Smoking status: Current Some Day Smoker -- 0.25 packs/day for 20 years    Types: Cigarettes  . Smokeless tobacco: Never Used  . Alcohol Use: 0.0 oz/week    0 Standard drinks or equivalent per week     Comment: social on weekends    Review of Systems Constitutional: No fever/chills Eyes: No visual changes. ENT: No sore throat. Cardiovascular: Denies chest pain but has left lateral point tenderness in the ribs Respiratory: Denies shortness of breath. Gastrointestinal: No abdominal pain.  No nausea, no vomiting.  No diarrhea.  No constipation. Genitourinary: Negative for dysuria. Musculoskeletal: Negative for back pain. Skin: Negative for rash.  Laceration to the left cheek Neurological: Negative for headaches, focal weakness or numbness.  10-point ROS otherwise negative.  ____________________________________________   PHYSICAL EXAM:  VITAL SIGNS: ED Triage Vitals  Enc Vitals Group     BP 12/24/14 2359 116/81 mmHg     Pulse Rate 12/24/14 2359 78     Resp 12/24/14 2359 16     Temp 12/24/14 2359 98.2 F (36.8 C)     Temp Source 12/24/14 2359 Oral     SpO2 12/24/14 2359 96 %     Weight 12/24/14 2359 211 lb (95.709 kg)     Height 12/24/14 2359 6' (1.829 m)     Head Cir --      Peak Flow --      Pain Score 12/25/14 0000 8     Pain Loc --      Pain Edu? --      Excl. in [place]? --     Constitutional: Alert and oriented. Well  appearing and in no acute distress. Eyes: Conjunctivae are normal. PERRL. EOMI. periorbital bruising of the left eye near the cheek laceration. Head: Bruising below the left eye but does not have bilateral raccoon eyes.  Negative Battle sign.  Contusions seen on the head.  2 cm horizontal clean laceration below left eye Nose: No congestion/rhinnorhea. Mouth/Throat: Mucous membranes are moist.  Oropharynx non-erythematous. Neck: No stridor.  No cervical spine tenderness to palpation. Cardiovascular: Normal rate, regular rhythm. Grossly normal heart sounds.  Good peripheral circulation.  Highly reproducible point tenderness to the left lateral ribs near the bottom of the rib cage.  No obvious bruising or contusion. Respiratory: Normal respiratory effort.  No retractions. Lungs CTAB. Gastrointestinal: Soft and nontender. No distention. No abdominal bruits. No CVA tenderness. Musculoskeletal: No lower extremity tenderness nor edema.  No joint effusions. Neurologic:  Normal speech and language. No gross focal neurologic deficits are appreciated.  Skin:  Skin is warm, dry and intact. No rash noted. Psychiatric: Mood and affect are normal. Speech and behavior are normal.  ____________________________________________   LABS (all labs ordered are listed, but only abnormal  results are displayed)  Labs Reviewed - No data to display ____________________________________________  EKG  ED ECG REPORT I, Alexea Blase, the attending physician, personally viewed and interpreted this ECG.   Date: 12/25/2014  EKG Time: 00:15  Rate: 77  Rhythm: normal sinus rhythm, frequent PACs  Axis: Normal  Intervals:Normal  ST&T Change: Non-specific ST segment / T-wave changes, but no evidence of acute ischemia.   ____________________________________________  RADIOLOGY   Dg Ribs Unilateral W/chest Left  12/25/2014  CLINICAL DATA:  Pain in the left lower posterior lateral ribs after a fall. EXAM: LEFT RIBS  AND CHEST - 3+ VIEW COMPARISON:  Chest 07/30/2014 FINDINGS: Postoperative changes in the mediastinum. Slightly shallow inspiration. Heart size and pulmonary vascularity are normal. Mild central interstitial pattern may represent chronic bronchitis. No focal airspace disease or consolidation in the lungs. No blunting of costophrenic angles. No pneumothorax. Tortuous aorta. The left ribs appear intact. No acute displaced fractures or focal bone lesions identified. IMPRESSION: Bronchitic change lungs. No evidence of active pulmonary disease. Negative left ribs. Electronically Signed   By: Lucienne Capers M.D.   On: 12/25/2014 05:38   Ct Head Wo Contrast  12/25/2014  CLINICAL DATA:  Slip and fall injury with facial contusion and laceration. Takes warfarin. EXAM: CT HEAD WITHOUT CONTRAST TECHNIQUE: Contiguous axial images were obtained from the base of the skull through the vertex without intravenous contrast. COMPARISON:  None. FINDINGS: Diffuse cerebral atrophy. R no ventricular dilatation. Patchy low-attenuation changes in the deep white matter consistent with small vessel ischemia. No mass effect or midline shift. No abnormal extra-axial fluid collections. Gray-white matter junctions are distinct. Basal cisterns are not effaced. No evidence of acute intracranial hemorrhage. No depressed skull fractures. Opacification of multiple bilateral ethmoid air cells. Mild mucosal thickening in the remaining paranasal sinuses. Mastoid air cells are not opacified. IMPRESSION: No acute intracranial abnormalities. Chronic atrophy and small vessel ischemic changes. Electronically Signed   By: Lucienne Capers M.D.   On: 12/25/2014 04:58    ____________________________________________   PROCEDURES  Procedure(s) performed: laceration repair, see procedure note(s).   LACERATION REPAIR Performed by: Hinda Kehr Authorized by: Hinda Kehr Consent: Verbal consent obtained. Risks and benefits: risks, benefits and  alternatives were discussed Consent given by: patient Patient identity confirmed: provided demographic data Prepped and Draped in normal sterile fashion Wound explored  Laceration Location: left cheek  Laceration Length: 2cm  No Foreign Bodies seen or palpated  Irrigation method: syringe Amount of cleaning: standard  Skin closure: skin adhesive and steri-strips  Patient tolerance: Patient tolerated the procedure well with no immediate complications.   Critical Care performed: No ____________________________________________   INITIAL IMPRESSION / ASSESSMENT AND PLAN / ED COURSE  Pertinent labs & imaging results that were available during my care of the patient were reviewed by me and considered in my medical decision making (see chart for details).  Given how linear and clean the laceration on his cheek appears, I discussed the various options with him in terms of Dermabond plus or minus Steri-Strips versus sutures.  The patient prefers to try the Dermabond and Steri-Strips.  After Dermabond in the wound I applied Steri-Strips across the length of the wound and told him that he should keep it clean and dry and that they will fall off in a week or more.  I think this is a good solution for this type of wound .  CT scan and x-rays are unremarkable.  When I went back to check on the patient is  dressed and stated he was ready to go.  I gave my usual and customary return precautions.     ____________________________________________  FINAL CLINICAL IMPRESSION(S) / ED DIAGNOSES  Final diagnoses:  Facial contusion, initial encounter  Facial laceration, initial encounter  Rib contusion, left, initial encounter      NEW MEDICATIONS STARTED DURING THIS VISIT:  Discharge Medication List as of 12/25/2014  6:42 AM       Hinda Kehr, MD 12/25/14 [id number]

## 2014-12-25 NOTE — ED Notes (Signed)
Pt updated on wait time, pt ambulatory around waiting room without difficulty.

## 2014-12-25 NOTE — ED Notes (Signed)
Pt reports falling and hitting face on countertop.  2" lac noted to pt's left cheek.  PT reports mid left back pain after fall.  Pt NAD at this time, respirations equal and unlabored, skin warm and dry.

## 2014-12-25 NOTE — Discharge Instructions (Signed)
You have been seen in the Emergency Department (ED) today for a fall.  Your work up does not show any concerning injuries.  Please take over-the-counter ibuprofen and/or Tylenol as needed for your pain (unless you have an allergy or your doctor as told you not to take them), or take any prescribed medication as instructed.  Keep your cheek wound clean and dry and the Steri-Strips will fall off on their own in a week or so.  Please follow up with your doctor regarding today's Emergency Department (ED) visit and your recent fall.    Return to the ED if you have any headache, confusion, slurred speech, weakness/numbness of any arm or leg, or any increased pain.   Facial Laceration A facial laceration is a cut on the face. These injuries can be painful and cause bleeding. Some cuts may need to be closed with stitches (sutures), skin adhesive strips, or wound glue. Cuts usually heal quickly but can leave a scar. It can take 1-2 years for the scar to go away completely. HOME CARE   Only take medicines as told by your doctor.  Follow your doctor's instructions for wound care. For Stitches:  Keep the cut clean and dry.  If you have a bandage (dressing), change it at least once a day. Change the bandage if it gets wet or dirty, or as told by your doctor.  Wash the cut with soap and water 2 times a day. Rinse the cut with water. Pat it dry with a clean towel.  Put a thin layer of medicated cream on the cut as told by your doctor.  You may shower after the first 24 hours. Do not soak the cut in water until the stitches are removed.  Have your stitches removed as told by your doctor.  Do not wear any makeup until a few days after your stitches are removed. For Skin Adhesive Strips:  Keep the cut clean and dry.  Do not get the strips wet. You may take a bath, but be careful to keep the cut dry.  If the cut gets wet, pat it dry with a clean towel.  The strips will fall off on their own. Do not  remove the strips that are still stuck to the cut. For Wound Glue:  You may shower or take baths. Do not soak or scrub the cut. Do not swim. Avoid heavy sweating until the glue falls off on its own. After a shower or bath, pat the cut dry with a clean towel.  Do not put medicine or makeup on your cut until the glue falls off.  If you have a bandage, do not put tape over the glue.  Avoid lots of sunlight or tanning lamps until the glue falls off.  The glue will fall off on its own in 5-10 days. Do not pick at the glue. After Healing:  Put sunscreen on the cut for the first year to reduce your scar. GET HELP IF:  You have a fever. GET HELP RIGHT AWAY IF:   Your cut area gets red, painful, or puffy (swollen).  You see a yellowish-white fluid (pus) coming from the cut.   This information is not intended to replace advice given to you by your health care provider. Make sure you discuss any questions you have with your health care provider.   Document Released: 06/12/2007 Document Revised: 01/14/2014 Document Reviewed: 08/06/2012 Elsevier Interactive Patient Education 2016 [place].  Facial or Scalp Contusion  A facial  or scalp contusion is a deep bruise on the face or head. Contusions happen when an injury causes bleeding under the skin. Signs of bruising include pain, puffiness (swelling), and discolored skin. The contusion may turn blue, purple, or yellow. HOME CARE  Only take medicines as told by your doctor.  Put ice on the injured area.  Put ice in a plastic bag.  Place a towel between your skin and the bag.  Leave the ice on for 20 minutes, 2-3 times a day. GET HELP IF:  You have bite problems.  You have pain when chewing.  You are worried about your face not healing normally. GET HELP RIGHT AWAY IF:   You have severe pain or a headache and medicine does not help.  You are very tired or confused, or your personality changes.  You throw up (vomit).  You  have a nosebleed that will not stop.  You see two of everything (double vision) or have blurry vision.  You have fluid coming from your nose or ear.  You have problems walking or using your arms or legs. MAKE SURE YOU:   Understand these instructions.  Will watch your condition.  Will get help right away if you are not doing well or get worse.   This information is not intended to replace advice given to you by your health care provider. Make sure you discuss any questions you have with your health care provider.   Document Released: 12/13/2010 Document Revised: 01/14/2014 Document Reviewed: 08/06/2012 Elsevier Interactive Patient Education 2016 [place].   Chest Contusion    A contusion is a deep bruise. Bruises happen when an injury causes bleeding under the skin. Signs of bruising include pain, puffiness (swelling), and discolored skin. The bruise may turn blue, purple, or yellow.  HOME CARE  Put ice on the injured area.  Put ice in a plastic bag.  Place a towel between the skin and the bag.  Leave the ice on for 15-20 minutes at a time, 03-04 times a day for the first 48 hours. Only take medicine as told by your doctor.  Rest.  Take deep breaths (deep-breathing exercises) as told by your doctor.  Stop smoking if you smoke.  Do not lift objects over 5 pounds (2.3 kilograms) for 3 days or longer if told by your doctor. GET HELP RIGHT AWAY IF:  You have more bruising or puffiness.  You have pain that gets worse.  You have trouble breathing.  You are dizzy, weak, or pass out (faint).  You have blood in your pee (urine) or poop (stool).  You cough up or throw up (vomit) blood.  Your puffiness or pain is not helped with medicines. MAKE SURE YOU:  Understand these instructions.  Will watch your condition.  Will get help right away if you are not doing well or get worse. This information is not intended to replace advice given to you by your health care provider. Make  sure you discuss any questions you have with your health care provider.  Document Released: 06/12/2007 Document Revised: 09/18/2011 Document Reviewed: 06/17/2011  Elsevier Interactive Patient Education [employer].

## 2014-12-25 NOTE — ED Notes (Signed)
Pt updated on wait, pt verbalizes understanding.  °

## 2015-01-11 ENCOUNTER — Ambulatory Visit: Payer: Medicare PPO

## 2015-01-16 ENCOUNTER — Ambulatory Visit: Payer: Medicare PPO

## 2015-01-23 ENCOUNTER — Encounter: Payer: Self-pay | Admitting: Urology

## 2015-01-23 ENCOUNTER — Ambulatory Visit (INDEPENDENT_AMBULATORY_CARE_PROVIDER_SITE_OTHER): Payer: Medicare PPO | Admitting: Urology

## 2015-01-23 VITALS — BP 110/71 | HR 74 | Ht 72.0 in | Wt 210.7 lb

## 2015-01-23 DIAGNOSIS — N3941 Urge incontinence: Secondary | ICD-10-CM | POA: Insufficient documentation

## 2015-01-23 DIAGNOSIS — N400 Enlarged prostate without lower urinary tract symptoms: Secondary | ICD-10-CM

## 2015-01-23 DIAGNOSIS — I82419 Acute embolism and thrombosis of unspecified femoral vein: Secondary | ICD-10-CM | POA: Insufficient documentation

## 2015-01-23 NOTE — Progress Notes (Signed)
2:02 PM   Martin Adkins Dec 29, 1945 [id number]  Referring provider: Madelyn Brunner, MD [place] [facility] [place], [place] [address]  Chief Complaint  Patient presents with  . Follow-up    Ultrasound results    HPI:  1 - Lower Urinary Tract Symptoms / Prostatic Hypertrophy / Urge Incontinence - pt with progressive bother from mix of irriative and obstructive sympotms including hesitatncy, nocturia, and small volume urge incontinence. Failed outlet and OAB directed meds x several. UDS 01/2015 with mixed pattern with significant obstruction with Pdet 70 at Qm10, and some instability, but only at volumes >271mL. PVR <55mL x several. Office cysto 2016 with trilobar intraluminar hypertrophy (Budzyn) and vol 44mL (by prior CT calculation).  PMH sig for Iiac stent, DVT (on coumadin per Dr. Burnice Adkins, has come off for procedures).   Today "Martin Adkins" is seen in f/u above after urodynamics.    PMH: Past Medical History  Diagnosis Date  . Hyperlipemia   . RA (rheumatoid arthritis) ([place])   . OSA (obstructive sleep apnea)   . PVD (peripheral vascular disease) ([place])   . GERD (gastroesophageal reflux disease)   . Osteoarthritis   . NSTEMI (non-ST elevated myocardial infarction) ([place])   . Hyperplastic polyps of stomach   . Barrett's esophagus   . Diabetes ([place])   . DVT (deep venous thrombosis) ([place])   . Abdominal aortic aneurysm without rupture ([place])   . CAD (coronary artery disease)   . SOB (shortness of breath)   . CHF (congestive heart failure) ([place])   . Hypertension   . Arthritis   . BPH (benign prostatic hyperplasia)   . AAA (abdominal aortic aneurysm) without rupture ([place]) 04/05/2014  . Benign essential HTN 05/02/2014  . Arteriosclerosis of coronary artery 04/05/2014    Overview:  Sp cabg with lima to lad svg to d1 om1 rca 2004 with occluded svg to rca and d1 and pci stetn of om1 graft 2015   . Chronic systolic heart failure ([place]) 04/19/2014   Overview:  With segmental LV systolic dysfunction ejection fraction of 35%   . Antepartum deep phlebothrombosis ([place]) 07/28/2014  . Acute non-ST elevation myocardial infarction (NSTEMI) ([place]) 08/15/2014  . Myocardial infarction ([place]) 07/30/2014  . AAA (abdominal aortic aneurysm) without rupture ([place]) 04/05/2014    Surgical History: Past Surgical History  Procedure Laterality Date  . Coronary angioplasty with stent placement  2015  . Coronary artery bypass graft    . Abdominal aorta stent    . Peripheral vascular catheterization N/A 09/06/2014    Procedure: IVC Filter Removal;  Surgeon: Martin Cabal, MD;  Location: [facility];  Service: Cardiovascular;  Laterality: N/A;    Home Medications:    Medication List       This list is accurate as of: 01/23/15  2:02 PM.  Always use your most recent med list.               amLODipine 10 MG tablet  Commonly known as:  NORVASC     atorvastatin 80 MG tablet  Commonly known as:  LIPITOR  TAKE 1 TABLET (80 MG TOTAL) BY MOUTH ONCE DAILY.     chlorthalidone 25 MG tablet  Commonly known as:  HYGROTON  Take 1 tablet by mouth daily.     clopidogrel 75 MG tablet  Commonly known as:  PLAVIX  Reported on 01/23/2015     fluticasone 50 MCG/ACT nasal spray  Commonly known as:  FLONASE  HCA TRIPLE ANTIBIOTIC OINTMENT EX     Heat Therapy Patches Misc  Reported on 01/23/2015     isosorbide mononitrate 30 MG 24 hr tablet  Commonly known as:  IMDUR     LUBRICANT DROPS OP  Place 1 drop into both eyes daily.     LUBRICANT EYE DROPS 0.4-0.3 % Soln  Generic drug:  Polyethyl Glycol-Propyl Glycol     metoprolol succinate 100 MG 24 hr tablet  Commonly known as:  TOPROL-XL     mirabegron ER 25 MG Tb24 tablet  Commonly known as:  MYRBETRIQ  Take 1 tablet (25 mg total) by mouth daily.     Naphazoline-Polyethyl Glycol 0.012-0.2 % Soln  Place 1 drop into both eyes daily.     NASAL DECONGESTANT SPRAY 0.05 % nasal spray  Generic  drug:  oxymetazoline     pantoprazole 40 MG tablet  Commonly known as:  PROTONIX     potassium chloride SA 20 MEQ tablet  Commonly known as:  K-DUR,KLOR-CON     valsartan 160 MG tablet  Commonly known as:  DIOVAN  Take 160 mg by mouth daily.     warfarin 2.5 MG tablet  Commonly known as:  COUMADIN  2.5 mg at bedtime.     warfarin 5 MG tablet  Commonly known as:  COUMADIN  TAKE 1 TABLET (5 MG TOTAL) BY MOUTH ONCE DAILY.        Allergies:  Allergies  Allergen Reactions  . Ace Inhibitors Other (See Comments)    Other reaction(s): Cough  . Niacin Other (See Comments)  . Pravastatin Other (See Comments)    Other reaction(s): Muscle Pain  . Rosuvastatin Other (See Comments)    Other reaction(s): Other (See Comments) GI bleed  . Simvastatin Other (See Comments)    Other reaction(s): Muscle Pain  . Amoxicillin Rash  . Penicillins Rash    Family History: Family History  Problem Relation Age of Onset  . Bladder Cancer Neg Hx   . Prostate cancer Neg Hx   . Kidney cancer Neg Hx     Social History:  reports that he has been smoking Cigarettes.  He has a 5 pack-year smoking history. He has never used smokeless tobacco. He reports that he drinks alcohol. He reports that he does not use illicit drugs.  ROS: UROLOGY Frequent Urination?: No Hard to postpone urination?: Yes Burning/pain with urination?: No Get up at night to urinate?: No Leakage of urine?: Yes Urine stream starts and stops?: No Trouble starting stream?: No Do you have to strain to urinate?: No Blood in urine?: No Urinary tract infection?: No Sexually transmitted disease?: No Injury to kidneys or bladder?: No Painful intercourse?: No Weak stream?: No Erection problems?: No Penile pain?: No  Gastrointestinal Nausea?: No Vomiting?: No Indigestion/heartburn?: No Diarrhea?: No Constipation?: No  Constitutional Fever: No Night sweats?: No Weight loss?: No Fatigue?: No  Skin Skin  rash/lesions?: No Itching?: No  Eyes Blurred vision?: No Double vision?: No  Ears/Nose/Throat Sore throat?: No Sinus problems?: No  Hematologic/Lymphatic Swollen glands?: No Easy bruising?: No  Cardiovascular Leg swelling?: No Chest pain?: No  Respiratory Cough?: No Shortness of breath?: No  Endocrine Excessive thirst?: No  Musculoskeletal Back pain?: No Joint pain?: No  Neurological Headaches?: No Dizziness?: No  Psychologic Depression?: No Anxiety?: No  Physical Exam: BP 110/71 mmHg  Pulse 74  Ht 6' (1.829 m)  Wt 210 lb 11.2 oz (95.573 kg)  BMI 28.57 kg/m2  Constitutional:  Alert and oriented, No  acute distress. HEENT: [place] AT, moist mucus membranes.  Trachea midline, no masses. Cardiovascular: No clubbing, cyanosis, or edema. Respiratory: Normal respiratory effort, no increased work of breathing. GI: Abdomen is soft, nontender, nondistended, no abdominal masses GU: No CVA tenderness.  Skin: No rashes, bruises or suspicious lesions. Lymph: No cervical or inguinal adenopathy. Neurologic: Grossly intact, no focal deficits, moving all 4 extremities. Psychiatric: Normal mood and affect.  Laboratory Data: Lab Results  Component Value Date   WBC 10.0 07/30/2014   HGB 15.4 07/30/2014   HCT 47.4 07/30/2014   MCV 79.6* 07/30/2014   PLT 189 07/30/2014    Lab Results  Component Value Date   CREATININE 1.06 07/30/2014    No results found for: PSA  No results found for: TESTOSTERONE  No results found for: HGBA1C  Urinalysis    Component Value Date/Time   COLORURINE Yellow 07/09/2011 0147   APPEARANCEUR Clear 07/09/2011 0147   LABSPEC 1.023 07/09/2011 0147   PHURINE 7.0 07/09/2011 0147   GLUCOSEU Negative 11/25/2014 0959   GLUCOSEU Negative 07/09/2011 0147   HGBUR Negative 07/09/2011 0147   BILIRUBINUR Negative 11/25/2014 0959   BILIRUBINUR Negative 07/09/2011 0147   KETONESUR Negative 07/09/2011 0147   PROTEINUR Negative 07/09/2011 0147    NITRITE Negative 11/25/2014 0959   NITRITE Negative 07/09/2011 0147   LEUKOCYTESUR Negative 11/25/2014 0959   LEUKOCYTESUR Negative 07/09/2011 0147      Assessment & Plan:    1 - Lower Urinary Tract Symptoms / Prostatic Hypertrophy / Urge Incontinence - discussed contiued medical therapy (myrbetriq + outlet meds) v. Proceed with outlet procedure (TURP v. TUIP) with goal of maximizing obsructive component, which will hopefully also benefit urgency as well. He very much wants to proceed.  Will need about 2 week window off coumadin, which he has done before. (stop 5 days prior, then off abotu 10 days after)  We discussed options for medical refractory prostatic outlet obstruction including TURP, TUNA, TUMT, Green-Light, Ho-LEP, and simple prostatectomy with their respective risks, benefits, and long-term outcomes data. Pt has opted for TURP. We discussed the typical peri-operative course with overnight admission and discharge with foley in place with subsequent office voiding trial few days later. We discussed risks including bleeding, infection, incontinence, need for repeat procedures / tissue regrowth over time as well as rare risks including DVT, PE, MI, CVA and mortality.   Will plan as overnight observation, DC with foley and office trial of void 3-5 days later, or per surgeon MD.   No Follow-up on file.  Alexis Frock, [facility] [address] [place], [place] [address] [phone]

## 2015-01-25 ENCOUNTER — Telehealth: Payer: Self-pay | Admitting: Radiology

## 2015-01-25 NOTE — Telephone Encounter (Signed)
LMOM at both home & cell #.  Need to notify pt of surgery information.

## 2015-01-25 NOTE — Telephone Encounter (Signed)
Pt notified of surgery scheduled for 02/20/15, pre-admit testing appt on 02/09/15 @9 :00, call Friday prior to surgery for arrival time to SDS, npo after mn day of surgery, stop Plavix and coumadin prior to surgery with last dose of Plavix to be taken on 2/5 & last dose of Coumadin to be taken on 02/14/15. Pt voices understanding.

## 2015-02-09 ENCOUNTER — Encounter
Admission: RE | Admit: 2015-02-09 | Discharge: 2015-02-09 | Disposition: A | Discharge status: Home / Self Care | Payer: Medicare PPO | Source: Ambulatory Visit | Attending: Urology | Admitting: Urology

## 2015-02-09 DIAGNOSIS — Z01812 Encounter for preprocedural laboratory examination: Secondary | ICD-10-CM | POA: Diagnosis not present

## 2015-02-09 HISTORY — DX: Personal history of other infectious and parasitic diseases: Z86.19

## 2015-02-09 HISTORY — DX: Antiphospholipid syndrome: D68.61

## 2015-02-09 LAB — PROTIME-INR
INR: 1.91
PROTHROMBIN TIME: 21.8 s — AB (ref 11.4–15.0)

## 2015-02-09 LAB — BASIC METABOLIC PANEL
Anion gap: 7 (ref 5–15)
BUN: 18 mg/dL (ref 6–20)
CHLORIDE: 107 mmol/L (ref 101–111)
CO2: 25 mmol/L (ref 22–32)
CREATININE: 0.76 mg/dL (ref 0.61–1.24)
Calcium: 8.9 mg/dL (ref 8.9–10.3)
GFR calc non Af Amer: >60 mL/min (ref >60)
Glucose, Bld: 99 mg/dL (ref 65–99)
POTASSIUM: 4.1 mmol/L (ref 3.5–5.1)
SODIUM: 139 mmol/L (ref 135–145)

## 2015-02-09 LAB — DIFFERENTIAL
BASOS ABS: 0.1 10*3/uL (ref 0–0.1)
Basophils Relative: 1 %
EOS PCT: 1 %
Eosinophils Absolute: 0.1 10*3/uL (ref 0–0.7)
LYMPHS PCT: 47 %
Lymphs Abs: 3 10*3/uL (ref 1.0–3.6)
Monocytes Absolute: 0.4 10*3/uL (ref 0.2–1.0)
Monocytes Relative: 6 %
NEUTROS ABS: 2.9 10*3/uL (ref 1.4–6.5)
NEUTROS PCT: 45 %

## 2015-02-09 LAB — CBC
HCT: 43.8 % (ref 40.0–52.0)
Hemoglobin: 14.3 g/dL (ref 13.0–18.0)
MCH: 26.7 pg (ref 26.0–34.0)
MCHC: 32.6 g/dL (ref 32.0–36.0)
MCV: 81.9 fL (ref 80.0–100.0)
PLATELETS: 145 10*3/uL — AB (ref 150–440)
RBC: 5.35 MIL/uL (ref 4.40–5.90)
RDW: 16.5 % — ABNORMAL HIGH (ref 11.5–14.5)
WBC: 6.3 10*3/uL (ref 3.8–10.6)

## 2015-02-09 NOTE — Pre-Procedure Instructions (Signed)
Cardiac clearance received from Dr. Nehemiah Massed (low risk per clearance).

## 2015-02-09 NOTE — Patient Instructions (Signed)
  Your procedure is scheduled on: February 20, 2015 (Monday) Report to Day Surgery.[facility]) Second Floor To find out your arrival time please call [phone] between 1PM - 3PM on February 17, 2015 (Friday).  Remember: Instructions that are not followed completely may result in serious medical risk, up to and including death, or upon the discretion of your surgeon and anesthesiologist your surgery may need to be rescheduled.    __x__ 1. Do not eat food or drink liquids after midnight. No gum chewing or hard candies.     __x 2. No Alcohol for 24 hours before or after surgery.   ____ 3. Bring all medications with you on the day of surgery if instructed.    __x__ 4. Notify your doctor if there is any change in your medical condition     (cold, fever, infections).     Do not wear jewelry, make-up, hairpins, clips or nail polish.  Do not wear lotions, powders, or perfumes. You may wear deodorant.  Do not shave 48 hours prior to surgery. Men may shave face and neck.  Do not bring valuables to the hospital.    [facility] is not responsible for any belongings or valuables.               Contacts, dentures or bridgework may not be worn into surgery.  Leave your suitcase in the car. After surgery it may be brought to your room.  For patients admitted to the hospital, discharge time is determined by your                treatment team.   Patients discharged the day of surgery will not be allowed to drive home.   Please read over the following fact sheets that you were given:   MRSA Information and Surgical Site Infection Prevention   ____ Take these medicines the morning of surgery with A SIP OF WATER:    1. Amlodipine  2. Atorvastatin  3. Isosorbide  4.Metoprolol  5. Valsartan  6.Pantoprazole (Pantoprazole at bedtime on February 12)  ____ Fleet Enema (as directed)   __x__ Use CHG Soap as directed  ____ Use inhalers on the day of surgery  ____ Stop metformin 2 days prior to  surgery    ____ Take 1/2 of usual insulin dose the night before surgery and none on the morning of surgery.   __x__ Stop Coumadin/Plavix/aspirin on (Stop Plavix on February 6, and Stop Coumadin on February 8)per written instructions from Dr .Erlene Quan office.  __x__ Stop Anti-inflammatories on (NO NSAIDS)   ____ Stop supplements until after surgery.    ____ Bring C-Pap to the hospital.

## 2015-02-20 ENCOUNTER — Inpatient Hospital Stay
Admission: AD | Admit: 2015-02-20 | Discharge: 2015-02-21 | DRG: 713 | Disposition: A | Discharge status: Home / Self Care | Payer: Medicare PPO | Source: Ambulatory Visit | Attending: Urology | Admitting: Urology

## 2015-02-20 ENCOUNTER — Ambulatory Visit: Payer: Medicare PPO | Admitting: Registered Nurse

## 2015-02-20 ENCOUNTER — Encounter: Payer: Self-pay | Admitting: *Deleted

## 2015-02-20 ENCOUNTER — Encounter
Admission: AD | Disposition: A | Discharge status: Home / Self Care | Payer: Self-pay | Source: Ambulatory Visit | Attending: Urology

## 2015-02-20 DIAGNOSIS — K219 Gastro-esophageal reflux disease without esophagitis: Secondary | ICD-10-CM | POA: Diagnosis present

## 2015-02-20 DIAGNOSIS — N400 Enlarged prostate without lower urinary tract symptoms: Secondary | ICD-10-CM | POA: Diagnosis present

## 2015-02-20 DIAGNOSIS — Z79899 Other long term (current) drug therapy: Secondary | ICD-10-CM | POA: Diagnosis not present

## 2015-02-20 DIAGNOSIS — Z888 Allergy status to other drugs, medicaments and biological substances status: Secondary | ICD-10-CM | POA: Diagnosis not present

## 2015-02-20 DIAGNOSIS — Z7901 Long term (current) use of anticoagulants: Secondary | ICD-10-CM

## 2015-02-20 DIAGNOSIS — I5022 Chronic systolic (congestive) heart failure: Secondary | ICD-10-CM | POA: Diagnosis present

## 2015-02-20 DIAGNOSIS — M069 Rheumatoid arthritis, unspecified: Secondary | ICD-10-CM | POA: Diagnosis present

## 2015-02-20 DIAGNOSIS — I739 Peripheral vascular disease, unspecified: Secondary | ICD-10-CM | POA: Diagnosis present

## 2015-02-20 DIAGNOSIS — F17210 Nicotine dependence, cigarettes, uncomplicated: Secondary | ICD-10-CM | POA: Diagnosis present

## 2015-02-20 DIAGNOSIS — K2270 Barrett's esophagus without dysplasia: Secondary | ICD-10-CM | POA: Diagnosis present

## 2015-02-20 DIAGNOSIS — I252 Old myocardial infarction: Secondary | ICD-10-CM | POA: Diagnosis not present

## 2015-02-20 DIAGNOSIS — N3941 Urge incontinence: Secondary | ICD-10-CM | POA: Diagnosis present

## 2015-02-20 DIAGNOSIS — I2510 Atherosclerotic heart disease of native coronary artery without angina pectoris: Secondary | ICD-10-CM | POA: Diagnosis present

## 2015-02-20 DIAGNOSIS — N138 Other obstructive and reflux uropathy: Secondary | ICD-10-CM | POA: Diagnosis present

## 2015-02-20 DIAGNOSIS — Z951 Presence of aortocoronary bypass graft: Secondary | ICD-10-CM

## 2015-02-20 DIAGNOSIS — Z880 Allergy status to penicillin: Secondary | ICD-10-CM | POA: Diagnosis not present

## 2015-02-20 DIAGNOSIS — E785 Hyperlipidemia, unspecified: Secondary | ICD-10-CM | POA: Diagnosis present

## 2015-02-20 DIAGNOSIS — N401 Enlarged prostate with lower urinary tract symptoms: Principal | ICD-10-CM | POA: Diagnosis present

## 2015-02-20 DIAGNOSIS — G4733 Obstructive sleep apnea (adult) (pediatric): Secondary | ICD-10-CM | POA: Diagnosis present

## 2015-02-20 DIAGNOSIS — Z955 Presence of coronary angioplasty implant and graft: Secondary | ICD-10-CM | POA: Diagnosis not present

## 2015-02-20 DIAGNOSIS — I110 Hypertensive heart disease with heart failure: Secondary | ICD-10-CM | POA: Diagnosis present

## 2015-02-20 DIAGNOSIS — E119 Type 2 diabetes mellitus without complications: Secondary | ICD-10-CM | POA: Diagnosis present

## 2015-02-20 HISTORY — PX: TRANSURETHRAL RESECTION OF PROSTATE: SHX73

## 2015-02-20 LAB — PROTIME-INR
INR: 1.1
Prothrombin Time: 14.4 seconds (ref 11.4–15.0)

## 2015-02-20 LAB — GLUCOSE, CAPILLARY
GLUCOSE-CAPILLARY: 93 mg/dL (ref 65–99)
Glucose-Capillary: 101 mg/dL — ABNORMAL HIGH (ref 65–99)

## 2015-02-20 SURGERY — TURP (TRANSURETHRAL RESECTION OF PROSTATE)
Anesthesia: General

## 2015-02-20 MED ORDER — IRBESARTAN 75 MG PO TABS: 150.0000 mg | ORAL_TABLET | Freq: Every day | ORAL | Status: DC

## 2015-02-20 MED ORDER — AMLODIPINE BESYLATE 10 MG PO TABS: 10.0000 mg | ORAL_TABLET | Freq: Every day | ORAL | Status: DC

## 2015-02-20 MED ORDER — CHLORTHALIDONE 25 MG PO TABS
25.0000 mg | ORAL_TABLET | Freq: Every day | ORAL | Status: DC
  Filled 2015-02-20: qty 1

## 2015-02-20 MED ORDER — DIPHENHYDRAMINE HCL 12.5 MG/5ML PO ELIX: 12.5000 mg | ORAL_SOLUTION | Freq: Four times a day (QID) | ORAL | Status: DC | PRN

## 2015-02-20 MED ORDER — WARFARIN SODIUM 1 MG PO TABS: 5.0000 mg | ORAL_TABLET | Freq: Every day | ORAL | Status: DC

## 2015-02-20 MED ORDER — ISOSORBIDE MONONITRATE ER 30 MG PO TB24: 30.0000 mg | ORAL_TABLET | Freq: Every day | ORAL | Status: DC

## 2015-02-20 MED ORDER — HYDROMORPHONE HCL 1 MG/ML IJ SOLN
INTRAMUSCULAR | Status: AC
  Filled 2015-02-20: qty 1

## 2015-02-20 MED ORDER — POLYVINYL ALCOHOL 1.4 % OP SOLN
1.0000 [drp] | Freq: Every day | OPHTHALMIC | Status: DC
  Filled 2015-02-20: qty 15

## 2015-02-20 MED ORDER — MIDAZOLAM HCL 2 MG/2ML IJ SOLN
INTRAMUSCULAR | Status: DC | PRN
  Administered 2015-02-20: 2 mg via INTRAVENOUS

## 2015-02-20 MED ORDER — DEXAMETHASONE SODIUM PHOSPHATE 10 MG/ML IJ SOLN
INTRAMUSCULAR | Status: DC | PRN
  Administered 2015-02-20: 10 mg via INTRAVENOUS

## 2015-02-20 MED ORDER — PHENYLEPHRINE HCL 10 MG/ML IJ SOLN
INTRAMUSCULAR | Status: DC | PRN
  Administered 2015-02-20 (×2): 200 ug via INTRAVENOUS

## 2015-02-20 MED ORDER — ACETAMINOPHEN 10 MG/ML IV SOLN
INTRAVENOUS | Status: DC | PRN
  Administered 2015-02-20: 1000 mg via INTRAVENOUS

## 2015-02-20 MED ORDER — BELLADONNA ALKALOIDS-OPIUM 16.2-60 MG RE SUPP: 1.0000 | Freq: Four times a day (QID) | RECTAL | Status: DC | PRN

## 2015-02-20 MED ORDER — POTASSIUM CHLORIDE CRYS ER 20 MEQ PO TBCR
20.0000 meq | EXTENDED_RELEASE_TABLET | Freq: Every day | ORAL | Status: DC
  Administered 2015-02-20 – 2015-02-21 (×2): 20 meq via ORAL
  Filled 2015-02-20 (×2): qty 1

## 2015-02-20 MED ORDER — WARFARIN - PHYSICIAN DOSING INPATIENT: Freq: Every day | Status: DC

## 2015-02-20 MED ORDER — OXYBUTYNIN CHLORIDE 5 MG PO TABS
5.0000 mg | ORAL_TABLET | Freq: Three times a day (TID) | ORAL | Status: DC | PRN
  Administered 2015-02-20: 5 mg via ORAL
  Filled 2015-02-20: qty 1

## 2015-02-20 MED ORDER — FENTANYL CITRATE (PF) 100 MCG/2ML IJ SOLN
INTRAMUSCULAR | Status: DC | PRN
  Administered 2015-02-20: 50 ug via INTRAVENOUS
  Administered 2015-02-20: 100 ug via INTRAVENOUS

## 2015-02-20 MED ORDER — ACETAMINOPHEN 325 MG PO TABS: 650.0000 mg | ORAL_TABLET | ORAL | Status: DC | PRN

## 2015-02-20 MED ORDER — METOPROLOL SUCCINATE ER 50 MG PO TB24: 100.0000 mg | ORAL_TABLET | Freq: Every day | ORAL | Status: DC

## 2015-02-20 MED ORDER — ACETAMINOPHEN 10 MG/ML IV SOLN
INTRAVENOUS | Status: AC
  Filled 2015-02-20: qty 100

## 2015-02-20 MED ORDER — GENTAMICIN IN SALINE 1.6-0.9 MG/ML-% IV SOLN
80.0000 mg | Freq: Once | INTRAVENOUS | Status: AC
  Administered 2015-02-20: 80 mg via INTRAVENOUS
  Filled 2015-02-20: qty 50

## 2015-02-20 MED ORDER — ONDANSETRON HCL 4 MG/2ML IJ SOLN
INTRAMUSCULAR | Status: DC | PRN
  Administered 2015-02-20: 4 mg via INTRAVENOUS

## 2015-02-20 MED ORDER — PANTOPRAZOLE SODIUM 40 MG PO TBEC
40.0000 mg | DELAYED_RELEASE_TABLET | Freq: Every day | ORAL | Status: DC
  Filled 2015-02-20: qty 1

## 2015-02-20 MED ORDER — DOCUSATE SODIUM 100 MG PO CAPS
100.0000 mg | ORAL_CAPSULE | Freq: Two times a day (BID) | ORAL | Status: DC
  Administered 2015-02-20 – 2015-02-21 (×3): 100 mg via ORAL
  Filled 2015-02-20 (×3): qty 1

## 2015-02-20 MED ORDER — NAPHAZOLINE-POLYETHYL GLYCOL 0.012-0.2 % OP SOLN: 1.0000 [drp] | Freq: Every day | OPHTHALMIC | Status: DC

## 2015-02-20 MED ORDER — DIPHENHYDRAMINE HCL 50 MG/ML IJ SOLN: 12.5000 mg | Freq: Four times a day (QID) | INTRAMUSCULAR | Status: DC | PRN

## 2015-02-20 MED ORDER — HYDROMORPHONE HCL 1 MG/ML IJ SOLN
INTRAMUSCULAR | Status: AC
  Administered 2015-02-20: 0.25 mg via INTRAVENOUS
  Filled 2015-02-20: qty 1

## 2015-02-20 MED ORDER — HYDROMORPHONE HCL 1 MG/ML IJ SOLN
0.2500 mg | INTRAMUSCULAR | Status: DC | PRN
  Administered 2015-02-20 (×6): 0.25 mg via INTRAVENOUS

## 2015-02-20 MED ORDER — WARFARIN SODIUM 1 MG PO TABS
7.5000 mg | ORAL_TABLET | Freq: Every day | ORAL | Status: AC
  Administered 2015-02-20: 7.5 mg via ORAL
  Filled 2015-02-20: qty 8

## 2015-02-20 MED ORDER — OXYCODONE-ACETAMINOPHEN 5-325 MG PO TABS
1.0000 | ORAL_TABLET | ORAL | Status: DC | PRN
  Administered 2015-02-20: 1 via ORAL
  Filled 2015-02-20: qty 1

## 2015-02-20 MED ORDER — SODIUM CHLORIDE 0.9 % IV SOLN
INTRAVENOUS | Status: DC
  Administered 2015-02-20 (×2): via INTRAVENOUS

## 2015-02-20 MED ORDER — CLOPIDOGREL BISULFATE 75 MG PO TABS
75.0000 mg | ORAL_TABLET | Freq: Every day | ORAL | Status: DC
  Administered 2015-02-21: 75 mg via ORAL
  Filled 2015-02-20: qty 1

## 2015-02-20 MED ORDER — ATORVASTATIN CALCIUM 20 MG PO TABS
80.0000 mg | ORAL_TABLET | Freq: Every day | ORAL | Status: DC
  Administered 2015-02-20: 80 mg via ORAL
  Filled 2015-02-20: qty 4

## 2015-02-20 MED ORDER — LIDOCAINE HCL 2 % EX GEL
CUTANEOUS | Status: DC | PRN
Start: 2015-02-20 — End: 2015-02-20
  Administered 2015-02-20: 1 via TOPICAL

## 2015-02-20 MED ORDER — SODIUM CHLORIDE 0.9 % IV SOLN
INTRAVENOUS | Status: DC
  Administered 2015-02-20: 50 mL/h via INTRAVENOUS

## 2015-02-20 MED ORDER — MORPHINE SULFATE (PF) 2 MG/ML IV SOLN: 2.0000 mg | INTRAVENOUS | Status: DC | PRN

## 2015-02-20 MED ORDER — SUGAMMADEX SODIUM 200 MG/2ML IV SOLN
INTRAVENOUS | Status: DC | PRN
  Administered 2015-02-20: 200 mg via INTRAVENOUS

## 2015-02-20 MED ORDER — PROPOFOL 10 MG/ML IV BOLUS
INTRAVENOUS | Status: DC | PRN
  Administered 2015-02-20: 200 mg via INTRAVENOUS

## 2015-02-20 MED ORDER — ONDANSETRON HCL 4 MG/2ML IJ SOLN: 4.0000 mg | INTRAMUSCULAR | Status: DC | PRN

## 2015-02-20 MED ORDER — METOCLOPRAMIDE HCL 5 MG/ML IJ SOLN: 10.0000 mg | Freq: Once | INTRAMUSCULAR | Status: DC | PRN

## 2015-02-20 MED ORDER — HEPARIN SODIUM (PORCINE) 5000 UNIT/ML IJ SOLN
5000.0000 [IU] | Freq: Three times a day (TID) | INTRAMUSCULAR | Status: DC
  Administered 2015-02-20 – 2015-02-21 (×3): 5000 [IU] via SUBCUTANEOUS
  Filled 2015-02-20 (×3): qty 1

## 2015-02-20 MED ORDER — WARFARIN SODIUM 1 MG PO TABS: 2.5000 mg | ORAL_TABLET | Freq: Every day | ORAL | Status: DC

## 2015-02-20 MED ORDER — ROCURONIUM BROMIDE 100 MG/10ML IV SOLN
INTRAVENOUS | Status: DC | PRN
  Administered 2015-02-20: 50 mg via INTRAVENOUS
  Administered 2015-02-20: 30 mg via INTRAVENOUS

## 2015-02-20 MED ORDER — EPHEDRINE SULFATE 50 MG/ML IJ SOLN
INTRAMUSCULAR | Status: DC | PRN
  Administered 2015-02-20: 10 mg via INTRAVENOUS

## 2015-02-20 MED ORDER — LIDOCAINE HCL (CARDIAC) 20 MG/ML IV SOLN
INTRAVENOUS | Status: DC | PRN
  Administered 2015-02-20: 100 mg via INTRAVENOUS

## 2015-02-20 MED ORDER — GLYCOPYRROLATE 0.2 MG/ML IJ SOLN
INTRAMUSCULAR | Status: DC | PRN
  Administered 2015-02-20: 0.2 mg via INTRAVENOUS

## 2015-02-20 MED ORDER — FLUTICASONE PROPIONATE 50 MCG/ACT NA SUSP
2.0000 | Freq: Every day | NASAL | Status: DC
  Filled 2015-02-20: qty 16

## 2015-02-20 SURGICAL SUPPLY — 26 items
ADAPTER IRRIG TUBE 2 SPIKE SOL (ADAPTER) ×2 IMPLANT
BAG DRAIN CYSTO-URO LG1000N (MISCELLANEOUS) ×2 IMPLANT
BAG URO DRAIN 2000ML W/SPOUT (MISCELLANEOUS) ×2 IMPLANT
BAG URO DRAIN 4000ML (MISCELLANEOUS) ×4 IMPLANT
CATH FOL 2WAY LX 24X30 (CATHETERS) ×2 IMPLANT
CATH FOL LEG HOLDER (MISCELLANEOUS) ×2 IMPLANT
CATH FOLEY 3WAY 30CC 22FR (CATHETERS) ×2 IMPLANT
DRAPE UTILITY 15X26 TOWEL STRL (DRAPES) ×2 IMPLANT
ELECT LOOP 22F BIPOLAR SML (ELECTROSURGICAL) ×2
ELECT REM PT RETURN 9FT ADLT (ELECTROSURGICAL) ×2
ELECTRODE LOOP 22F BIPOLAR SML (ELECTROSURGICAL) ×1 IMPLANT
ELECTRODE REM PT RTRN 9FT ADLT (ELECTROSURGICAL) ×1 IMPLANT
GLOVE BIO SURGEON STRL SZ 6.5 (GLOVE) ×2 IMPLANT
GLOVE BIO SURGEON STRL SZ7 (GLOVE) ×4 IMPLANT
GOWN STRL REUS W/ TWL LRG LVL3 (GOWN DISPOSABLE) ×2 IMPLANT
GOWN STRL REUS W/TWL LRG LVL3 (GOWN DISPOSABLE) ×2
IV SOD CHL 0.9% 1000ML (IV SOLUTION) ×12 IMPLANT
KIT RM TURNOVER CYSTO AR (KITS) ×2 IMPLANT
LOOP CUT BIPOLAR 24F LRG (ELECTROSURGICAL) ×2 IMPLANT
PACK CYSTO AR (MISCELLANEOUS) ×2 IMPLANT
PREP PVP WINGED SPONGE (MISCELLANEOUS) ×2 IMPLANT
SET CYSTO W/LG BORE CLAMP LF (SET/KITS/TRAYS/PACK) ×2 IMPLANT
SET IRRIGATING DISP (SET/KITS/TRAYS/PACK) ×2 IMPLANT
SYR TOOMEY 50ML (SYRINGE) ×2 IMPLANT
SYRINGE IRR TOOMEY STRL 70CC (SYRINGE) ×2 IMPLANT
WATER STERILE IRR 1000ML POUR (IV SOLUTION) ×2 IMPLANT

## 2015-02-20 NOTE — Op Note (Signed)
Date of procedure: 02/20/2015  Preoperative diagnosis:  1. BPH   Postoperative diagnosis:  1. BPH   Procedure: 1. TURP  Surgeon: Hollice Espy, MD  Anesthesia: General  Complications: None  Intraoperative findings: Small 30 cc prostate with slightly elevated bladder neck.  EBL: Minimal  Specimens: Prostate chips  Drains: 25 French three-way Foley catheter  Indication: Martin Adkins is a 70 y.o. patient with mixed irritative and obstructive voiding symptoms with a 30 cc prostate..  After reviewing the management options for treatment, he elected to proceed with the above surgical procedure(s). We have discussed the potential benefits and risks of the procedure, side effects of the proposed treatment, the likelihood of the patient achieving the goals of the procedure, and any potential problems that might occur during the procedure or recuperation. Informed consent has been obtained.  Description of procedure:  The patient was taken to the operating room and general anesthesia was induced.  The patient was placed in the dorsal lithotomy position, prepped and draped in the usual sterile fashion, and preoperative antibiotics were administered. A preoperative time-out was performed.   A 26 French resectoscope was advanced per urethra into the bladder using an obturator which advanced easily. The resecting loop was then brought in and prior to proceeding with a resection, the bladder was carefully inspected. There were no mucosal lesions, bladder tumors, or lesions noted. The bladder itself was smooth-walled without evidence of trabeculation. The trigone was relatively far from the bladder neck with each UO clearly visible effluxing clear yellow urine. The bladder neck itself was somewhat elevated with a small intravesical lobe notable. The prostatic length was relatively short, approximately 3-4 cm. The lateral lobes were not particularly obstructing in appearance. At this point in  time, the resection loop was used to take down the elevated bladder neck with care taken to avoid any injury to the trigone. The resection was carried down until striated fibers of the bladder neck were visible. The remainder of the transitional zone and lateral lobes were then taken down using the loop methodically. Care was taken to resect beyond the veru.  There was minimal bleeding noted. The prostate chips were then irrigated from the bladder. Once her no residual chips, careful hemostasis was achieved. The prostatic fossa was noted to be widely patent at the end of the case. The scope was then removed and a 22 [place] 3 way Foley catheter was placed. The balloon was filled with 30 cc of sterile water.  A very slow drip CBI was started and the urine remained crystal clear. The patient's catheter was secured to the left thigh using a catheter securement device. He was then cleaned and dried, he was repositioned the supine position, reversed from anesthesia, taken the PACU in stable condition.   Plan: Patient will be admitted overnight for observation/CBI. He'll be discharged in the morning after voiding trial.  Hollice Espy, M.D.

## 2015-02-20 NOTE — Anesthesia Preprocedure Evaluation (Addendum)
Anesthesia Evaluation  Patient identified by MRN, date of birth, ID band Patient awake    Reviewed: Allergy & Precautions, NPO status , Patient's Chart, lab work & pertinent test results  Airway Mallampati: II  TM Distance: >3 FB Neck ROM: Limited    Dental  (+) Poor Dentition, Missing Missing most in front, none loose.:   Pulmonary shortness of breath and with exertion, sleep apnea and Continuous Positive Airway Pressure Ventilation , Current Smoker,    breath sounds clear to auscultation       Cardiovascular Exercise Tolerance: Good hypertension, Pt. on medications + CAD, + Past MI, + CABG, + Peripheral Vascular Disease and +CHF  + dysrhythmias Atrial Fibrillation  Rhythm:Regular Rate:Normal  CAD, CABG, STENT, EF 35%, sometimes in afib., sometimes NSR,  Plavix and coumadin held for 7 and 8 days. One episode of CHF, can climb stairs and work in the garden.   Neuro/Psych    GI/Hepatic GERD  Medicated and Controlled,  Endo/Other  diabetes, Type 2BG 101.  Renal/GU      Musculoskeletal  (+) Arthritis , Rheumatoid disorders,  On plaquenil.   Abdominal (+)  Abdomen: soft.    Peds  Hematology   Anesthesia Other Findings   Reproductive/Obstetrics                            Anesthesia Physical Anesthesia Plan  ASA: III  Anesthesia Plan: General   Post-op Pain Management:    Induction: Intravenous  Airway Management Planned: Oral ETT  Additional Equipment:   Intra-op Plan:   Post-operative Plan: Extubation in OR  Informed Consent: I have reviewed the patients History and Physical, chart, labs and discussed the procedure including the risks, benefits and alternatives for the proposed anesthesia with the patient or authorized representative who has indicated his/her understanding and acceptance.     Plan Discussed with: CRNA  Anesthesia Plan Comments:         Anesthesia Quick  Evaluation

## 2015-02-20 NOTE — Interval H&P Note (Signed)
History and Physical Interval Note:  02/20/2015 10:50 AM  Martin Adkins  has presented today for surgery, with the diagnosis of BPH  The various methods of treatment have been discussed with the patient and family. After consideration of risks, benefits and other options for treatment, the patient has consented to  Procedure(s): TRANSURETHRAL RESECTION OF THE PROSTATE (TURP) (N/A) as a surgical intervention .  The patient's history has been reviewed, patient examined, no change in status, stable for surgery.  I have reviewed the patient's chart and labs.  Questions were answered to the patient's satisfaction.    RRR CTAB  May consider laser incision of the prostate if deemed appropriate intraoperatively.  Discussed with patient and added to consent form.     Hollice Espy

## 2015-02-20 NOTE — H&P (View-Only) (Signed)
2:02 PM   Martin Adkins 15-Aug-1945 [id number]  Referring provider: Madelyn Brunner, MD [facility] [place], [place] [address]  Chief Complaint  Patient presents with  . Follow-up    Ultrasound results    HPI:  1 - Lower Urinary Tract Symptoms / Prostatic Hypertrophy / Urge Incontinence - pt with progressive bother from mix of irriative and obstructive sympotms including hesitatncy, nocturia, and small volume urge incontinence. Failed outlet and OAB directed meds x several. UDS 01/2015 with mixed pattern with significant obstruction with Pdet 70 at Qm10, and some instability, but only at volumes >255mL. PVR <44mL x several. Office cysto 2016 with trilobar intraluminar hypertrophy ([place]) and vol 46mL (by prior CT calculation).  PMH sig for Iiac stent, DVT (on coumadin per Dr. Burnice Logan, has come off for procedures).   Today "Martin Adkins" is seen in f/u above after urodynamics.    PMH: Past Medical History  Diagnosis Date  . Hyperlipemia   . RA (rheumatoid arthritis) ([place])   . OSA (obstructive sleep apnea)   . PVD (peripheral vascular disease) ([place])   . GERD (gastroesophageal reflux disease)   . Osteoarthritis   . NSTEMI (non-ST elevated myocardial infarction) ([place])   . Hyperplastic polyps of stomach   . Barrett's esophagus   . Diabetes ([place])   . DVT (deep venous thrombosis) ([place])   . Abdominal aortic aneurysm without rupture ([place])   . CAD (coronary artery disease)   . SOB (shortness of breath)   . CHF (congestive heart failure) ([place])   . Hypertension   . Arthritis   . BPH (benign prostatic hyperplasia)   . AAA (abdominal aortic aneurysm) without rupture ([place]) 04/05/2014  . Benign essential HTN 05/02/2014  . Arteriosclerosis of coronary artery 04/05/2014    Overview:  Sp cabg with lima to lad svg to d1 om1 rca 2004 with occluded svg to rca and d1 and pci stetn of om1 graft 2015   . Chronic systolic heart failure ([place]) 04/19/2014   Overview:  With segmental LV systolic dysfunction ejection fraction of 35%   . Antepartum deep phlebothrombosis ([place]) 07/28/2014  . Acute non-ST elevation myocardial infarction (NSTEMI) ([place]) 08/15/2014  . Myocardial infarction ([place]) 07/30/2014  . AAA (abdominal aortic aneurysm) without rupture ([place]) 04/05/2014    Surgical History: Past Surgical History  Procedure Laterality Date  . Coronary angioplasty with stent placement  2015  . Coronary artery bypass graft    . Abdominal aorta stent    . Peripheral vascular catheterization N/A 09/06/2014    Procedure: IVC Filter Removal;  Surgeon: Katha Cabal, MD;  Location: [facility];  Service: Cardiovascular;  Laterality: N/A;    Home Medications:    Medication List       This list is accurate as of: 01/23/15  2:02 PM.  Always use your most recent med list.               amLODipine 10 MG tablet  Commonly known as:  NORVASC     atorvastatin 80 MG tablet  Commonly known as:  LIPITOR  TAKE 1 TABLET (80 MG TOTAL) BY MOUTH ONCE DAILY.     chlorthalidone 25 MG tablet  Commonly known as:  HYGROTON  Take 1 tablet by mouth daily.     clopidogrel 75 MG tablet  Commonly known as:  PLAVIX  Reported on 01/23/2015     fluticasone 50 MCG/ACT nasal spray  Commonly known as:  FLONASE  HCA TRIPLE ANTIBIOTIC OINTMENT EX     Heat Therapy Patches Misc  Reported on 01/23/2015     isosorbide mononitrate 30 MG 24 hr tablet  Commonly known as:  IMDUR     LUBRICANT DROPS OP  Place 1 drop into both eyes daily.     LUBRICANT EYE DROPS 0.4-0.3 % Soln  Generic drug:  Polyethyl Glycol-Propyl Glycol     metoprolol succinate 100 MG 24 hr tablet  Commonly known as:  TOPROL-XL     mirabegron ER 25 MG Tb24 tablet  Commonly known as:  MYRBETRIQ  Take 1 tablet (25 mg total) by mouth daily.     Naphazoline-Polyethyl Glycol 0.012-0.2 % Soln  Place 1 drop into both eyes daily.     NASAL DECONGESTANT SPRAY 0.05 % nasal spray  Generic  drug:  oxymetazoline     pantoprazole 40 MG tablet  Commonly known as:  PROTONIX     potassium chloride SA 20 MEQ tablet  Commonly known as:  K-DUR,KLOR-CON     valsartan 160 MG tablet  Commonly known as:  DIOVAN  Take 160 mg by mouth daily.     warfarin 2.5 MG tablet  Commonly known as:  COUMADIN  2.5 mg at bedtime.     warfarin 5 MG tablet  Commonly known as:  COUMADIN  TAKE 1 TABLET (5 MG TOTAL) BY MOUTH ONCE DAILY.        Allergies:  Allergies  Allergen Reactions  . Ace Inhibitors Other (See Comments)    Other reaction(s): Cough  . Niacin Other (See Comments)  . Pravastatin Other (See Comments)    Other reaction(s): Muscle Pain  . Rosuvastatin Other (See Comments)    Other reaction(s): Other (See Comments) GI bleed  . Simvastatin Other (See Comments)    Other reaction(s): Muscle Pain  . Amoxicillin Rash  . Penicillins Rash    Family History: Family History  Problem Relation Age of Onset  . Bladder Cancer Neg Hx   . Prostate cancer Neg Hx   . Kidney cancer Neg Hx     Social History:  reports that he has been smoking Cigarettes.  He has a 5 pack-year smoking history. He has never used smokeless tobacco. He reports that he drinks alcohol. He reports that he does not use illicit drugs.  ROS: UROLOGY Frequent Urination?: No Hard to postpone urination?: Yes Burning/pain with urination?: No Get up at night to urinate?: No Leakage of urine?: Yes Urine stream starts and stops?: No Trouble starting stream?: No Do you have to strain to urinate?: No Blood in urine?: No Urinary tract infection?: No Sexually transmitted disease?: No Injury to kidneys or bladder?: No Painful intercourse?: No Weak stream?: No Erection problems?: No Penile pain?: No  Gastrointestinal Nausea?: No Vomiting?: No Indigestion/heartburn?: No Diarrhea?: No Constipation?: No  Constitutional Fever: No Night sweats?: No Weight loss?: No Fatigue?: No  Skin Skin  rash/lesions?: No Itching?: No  Eyes Blurred vision?: No Double vision?: No  Ears/Nose/Throat Sore throat?: No Sinus problems?: No  Hematologic/Lymphatic Swollen glands?: No Easy bruising?: No  Cardiovascular Leg swelling?: No Chest pain?: No  Respiratory Cough?: No Shortness of breath?: No  Endocrine Excessive thirst?: No  Musculoskeletal Back pain?: No Joint pain?: No  Neurological Headaches?: No Dizziness?: No  Psychologic Depression?: No Anxiety?: No  Physical Exam: BP 110/71 mmHg  Pulse 74  Ht 6' (1.829 m)  Wt 210 lb 11.2 oz (95.573 kg)  BMI 28.57 kg/m2  Constitutional:  Alert and oriented, No  acute distress. HEENT:  AFB AT, moist mucus membranes.  Trachea midline, no masses. Cardiovascular: No clubbing, cyanosis, or edema. Respiratory: Normal respiratory effort, no increased work of breathing. GI: Abdomen is soft, nontender, nondistended, no abdominal masses GU: No CVA tenderness.  Skin: No rashes, bruises or suspicious lesions. Lymph: No cervical or inguinal adenopathy. Neurologic: Grossly intact, no focal deficits, moving all 4 extremities. Psychiatric: Normal mood and affect.  Laboratory Data: Lab Results  Component Value Date   WBC 10.0 07/30/2014   HGB 15.4 07/30/2014   HCT 47.4 07/30/2014   MCV 79.6* 07/30/2014   PLT 189 07/30/2014    Lab Results  Component Value Date   CREATININE 1.06 07/30/2014    No results found for: PSA  No results found for: TESTOSTERONE  No results found for: HGBA1C  Urinalysis    Component Value Date/Time   COLORURINE Yellow 07/09/2011 0147   APPEARANCEUR Clear 07/09/2011 0147   LABSPEC 1.023 07/09/2011 0147   PHURINE 7.0 07/09/2011 0147   GLUCOSEU Negative 11/25/2014 0959   GLUCOSEU Negative 07/09/2011 0147   HGBUR Negative 07/09/2011 0147   BILIRUBINUR Negative 11/25/2014 0959   BILIRUBINUR Negative 07/09/2011 0147   KETONESUR Negative 07/09/2011 0147   PROTEINUR Negative 07/09/2011 0147    NITRITE Negative 11/25/2014 0959   NITRITE Negative 07/09/2011 0147   LEUKOCYTESUR Negative 11/25/2014 0959   LEUKOCYTESUR Negative 07/09/2011 0147      Assessment & Plan:    1 - Lower Urinary Tract Symptoms / Prostatic Hypertrophy / Urge Incontinence - discussed contiued medical therapy (myrbetriq + outlet meds) v. Proceed with outlet procedure (TURP v. TUIP) with goal of maximizing obsructive component, which will hopefully also benefit urgency as well. He very much wants to proceed.  Will need about 2 week window off coumadin, which he has done before. (stop 5 days prior, then off abotu 10 days after)  We discussed options for medical refractory prostatic outlet obstruction including TURP, TUNA, TUMT, Green-Light, Ho-LEP, and simple prostatectomy with their respective risks, benefits, and long-term outcomes data. Pt has opted for TURP. We discussed the typical peri-operative course with overnight admission and discharge with foley in place with subsequent office voiding trial few days later. We discussed risks including bleeding, infection, incontinence, need for repeat procedures / tissue regrowth over time as well as rare risks including DVT, PE, MI, CVA and mortality.   Will plan as overnight observation, DC with foley and office trial of void 3-5 days later, or per surgeon MD.   No Follow-up on file.  Alexis Frock, [facility] [address], [place],  [address] [phone]

## 2015-02-20 NOTE — Anesthesia Postprocedure Evaluation (Signed)
Anesthesia Post Note  Patient: Martin Adkins  Procedure(s) Performed: Procedure(s) (LRB): TRANSURETHRAL RESECTION OF THE PROSTATE (TURP) (N/A)  Patient location during evaluation: PACU Anesthesia Type: General Level of consciousness: awake Pain management: pain level controlled Vital Signs Assessment: post-procedure vital signs reviewed and stable Respiratory status: spontaneous breathing Cardiovascular status: blood pressure returned to baseline Postop Assessment: no headache Anesthetic complications: no    Last Vitals:  Filed Vitals:   02/20/15 1251 02/20/15 1306  BP: 110/86 104/83  Pulse: 78 73  Temp:    Resp: 15 18    Last Pain:  Filed Vitals:   02/20/15 1309  PainSc: 5                  Svara Twyman M

## 2015-02-20 NOTE — Anesthesia Procedure Notes (Signed)
Procedure Name: Intubation Date/Time: 02/20/2015 11:49 AM Performed by: Doreen Salvage Pre-anesthesia Checklist: Patient identified, Patient being monitored, Timeout performed, Emergency Drugs available and Suction available Patient Re-evaluated:Patient Re-evaluated prior to inductionOxygen Delivery Method: Circle system utilized Preoxygenation: Pre-oxygenation with 100% oxygen Intubation Type: IV induction Ventilation: Mask ventilation without difficulty Laryngoscope Size: Mac and 3 Grade View: Grade I Tube type: Oral Tube size: 7.5 mm Number of attempts: 1 Airway Equipment and Method: Stylet Placement Confirmation: ETT inserted through vocal cords under direct vision,  positive ETCO2 and breath sounds checked- equal and bilateral Secured at: 21 cm Tube secured with: Tape Dental Injury: Teeth and Oropharynx as per pre-operative assessment

## 2015-02-20 NOTE — Transfer of Care (Signed)
Immediate Anesthesia Transfer of Care Note  Patient: Martin Adkins  Procedure(s) Performed: Procedure(s): TRANSURETHRAL RESECTION OF THE PROSTATE (TURP) (N/A)  Patient Location: PACU  Anesthesia Type:General  Level of Consciousness: sedated  Airway & Oxygen Therapy: Patient Spontanous Breathing and Patient connected to face mask oxygen  Post-op Assessment: Report given to RN and Post -op Vital signs reviewed and stable  Post vital signs: Reviewed and stable  Last Vitals:  Filed Vitals:   02/20/15 1025 02/20/15 1236  BP: 133/84 100/75  Pulse:  65  Temp: 36.7 C 36.7 C  Resp: 16 12    Complications: No apparent anesthesia complications

## 2015-02-21 LAB — PROTIME-INR
INR: 1.22
Prothrombin Time: 15.6 seconds — ABNORMAL HIGH (ref 11.4–15.0)

## 2015-02-21 LAB — BASIC METABOLIC PANEL
ANION GAP: 4 — AB (ref 5–15)
BUN: 11 mg/dL (ref 6–20)
CALCIUM: 6.9 mg/dL — AB (ref 8.9–10.3)
CHLORIDE: 106 mmol/L (ref 101–111)
CO2: 22 mmol/L (ref 22–32)
CREATININE: 0.64 mg/dL (ref 0.61–1.24)
Glucose, Bld: 150 mg/dL — ABNORMAL HIGH (ref 65–99)
Potassium: 3.8 mmol/L (ref 3.5–5.1)
Sodium: 132 mmol/L — ABNORMAL LOW (ref 135–145)

## 2015-02-21 LAB — HEMOGLOBIN AND HEMATOCRIT, BLOOD
HEMATOCRIT: 37.4 % — AB (ref 40.0–52.0)
HEMOGLOBIN: 12.3 g/dL — AB (ref 13.0–18.0)

## 2015-02-21 LAB — SURGICAL PATHOLOGY

## 2015-02-21 MED ORDER — DOCUSATE SODIUM 100 MG PO CAPS: 100.0000 mg | ORAL_CAPSULE | Freq: Two times a day (BID) | ORAL | Status: DC

## 2015-02-21 MED ORDER — OXYCODONE-ACETAMINOPHEN 5-325 MG PO TABS: 1.0000 | ORAL_TABLET | ORAL | Status: DC | PRN

## 2015-02-21 NOTE — Progress Notes (Signed)
Pt to be discharged to home. Teaching performed on increasing activity slowly and S/S of complications that should be f/u with primary doctor. Villa Herb RN-BC

## 2015-02-21 NOTE — Discharge Summary (Signed)
Date of admission: 02/20/2015  Date of discharge: 02/21/2015  Admission diagnosis: BPH  Discharge diagnosis: BPH  Secondary diagnoses:  Patient Active Problem List   Diagnosis Date Noted  . Deep venous thrombosis of profunda femoris vein (HCC) 01/23/2015  . BPH (benign prostatic hyperplasia) 01/23/2015  . Urge urinary incontinence 01/23/2015  . Type 2 diabetes mellitus (HCC) 10/31/2014  . Benign gastric polyp 10/31/2014  . Absence of bladder continence 10/09/2014  . Acute non-ST elevation myocardial infarction (NSTEMI) (HCC) 08/15/2014  . Atrial fibrillation (HCC) 08/05/2014  . Antiphospholipid syndrome (HCC) 08/01/2014  . H/O deep venous thrombosis 08/01/2014  . Myocardial infarction (HCC) 07/30/2014  . Barrett esophagus 07/28/2014  . Diabetes mellitus, type 2 (HCC) 07/28/2014  . Antepartum deep phlebothrombosis (HCC) 07/28/2014  . Hyperplastic adenomatous polyp of stomach 07/28/2014  . Benign essential HTN 05/02/2014  . Chronic systolic heart failure (HCC) 04/19/2014  . AAA (abdominal aortic aneurysm) without rupture (HCC) 04/05/2014  . Arteriosclerosis of coronary artery 04/05/2014  . Breathlessness on exertion 04/05/2014  . Abdominal aortic aneurysm (AAA) without rupture (HCC) 04/05/2014  . Acute non-ST segment elevation myocardial infarction (HCC) 05/14/2013  . Acid reflux 05/04/2013  . Combined fat and carbohydrate induced hyperlipemia 05/03/2013  . Obstructive apnea 05/03/2013  . Arthritis, degenerative 05/03/2013  . Peripheral blood vessel disorder (HCC) 05/03/2013  . Arthritis or polyarthritis, rheumatoid (HCC) 05/03/2013  . Compulsive tobacco user syndrome 05/03/2013  . Rheumatoid arthritis (HCC) 05/03/2013  . Peripheral vascular disease (HCC) 05/03/2013  . Current tobacco use 05/03/2013    History and Physical: For full details, please see admission history and physical. Briefly, Martin Adkins is a 70 y.o. year old patient with BPH with both obstructive and  irritative voiding symptoms who presents for TURP.  Hospital Course: Patient tolerated the procedure well.  He was then transferred to the floor after an uneventful PACU stay.  His hospital course was uncomplicated.  He remained on CBI overnight. This was stopped early in the morning on postop day 1, his urine remained relatively clear. His Foley catheter was removed and he was able to void several times without difficulty.  On POD#1 he had met discharge criteria: was eating a regular diet, was up and ambulating independently,  pain was well controlled, was voiding without a catheter, and was ready to for discharge.  Discharge Exam: Physical Exam  Constitutional: He is oriented to person, place, and time. He appears well-developed and well-nourished.  HENT:  Head: Normocephalic and atraumatic.  Neck: Normal range of motion.  Cardiovascular: Normal rate.   Pulmonary/Chest: Effort normal.  Abdominal: Soft. Bowel sounds are normal.  Genitourinary: Penis normal.  Musculoskeletal: Normal range of motion.  Neurological: He is alert and oriented to person, place, and time.  Skin: Skin is warm and dry.    Laboratory values:   Recent Labs  02/21/15 0628  HGB 12.3*  HCT 37.4*    Recent Labs  02/21/15 0628  NA 132*  K 3.8  CL 106  CO2 22  GLUCOSE 150*  BUN 11  CREATININE 0.64  CALCIUM 6.9*    Recent Labs  02/20/15 1032 02/21/15 0628  INR 1.10 1.22   No results for input(s): LABURIN in the last 72 hours. Results for orders placed or performed in visit on 11/25/14  Microscopic Examination     Status: Abnormal   Collection Time: 11/25/14  9:59 AM  Result Value Ref Range Status   WBC, UA 0-5 0 -  5 /hpf Final   RBC,  UA 0-2 0 -  2 /hpf Final   Epithelial Cells (non renal) None seen 0 - 10 /hpf Final   Mucus, UA Present (A) Not Estab. Final   Bacteria, UA None seen None seen/Few Final    Disposition: Home  Discharge instruction: The patient was instructed to be ambulatory  but told to refrain from heavy lifting, strenuous activity, or driving while taking narcotics.  Drink plenty of water to keep her urine clear.  You may restart your Coumadin starting the day of discharge on your normal schedule. I have recommended checking an INR either Friday or Monday to ensure that he's reached therapeutic levels.  He may resume your Plavix as soon as your urine is clear or in 7 days, which ever is first.    Discharge medications:    Medication List    TAKE these medications        amLODipine 10 MG tablet  Commonly known as:  NORVASC  Take 10 mg by mouth daily.     atorvastatin 80 MG tablet  Commonly known as:  LIPITOR  TAKE 1 TABLET (80 MG TOTAL) BY MOUTH ONCE DAILY.     chlorthalidone 25 MG tablet  Commonly known as:  HYGROTON  Take 25 mg by mouth daily.     clopidogrel 75 MG tablet  Commonly known as:  PLAVIX  Reported on 01/23/2015     docusate sodium 100 MG capsule  Commonly known as:  COLACE  Take 1 capsule (100 mg total) by mouth 2 (two) times daily.     fluticasone 50 MCG/ACT nasal spray  Commonly known as:  FLONASE  Place 2 sprays into both nostrils daily.     HCA TRIPLE ANTIBIOTIC OINTMENT EX  Reported on 02/20/2015     isosorbide mononitrate 30 MG 24 hr tablet  Commonly known as:  IMDUR  Take 30 mg by mouth daily.     LUBRICANT DROPS OP  Place 1 drop into both eyes daily.     LUBRICANT EYE DROPS 0.4-0.3 % Soln  Generic drug:  Polyethyl Glycol-Propyl Glycol  Apply 1 drop to eye daily.     metoprolol succinate 100 MG 24 hr tablet  Commonly known as:  TOPROL-XL  Take 100 mg by mouth daily.     mirabegron ER 25 MG Tb24 tablet  Commonly known as:  MYRBETRIQ  Take 1 tablet (25 mg total) by mouth daily.     Naphazoline-Polyethyl Glycol 0.012-0.2 % Soln  Place 1 drop into both eyes daily. Reported on 02/20/2015     NASAL DECONGESTANT SPRAY 0.05 % nasal spray  Generic drug:  oxymetazoline     oxyCODONE-acetaminophen 5-325 MG tablet   Commonly known as:  PERCOCET  Take 1-2 tablets by mouth every 4 (four) hours as needed for moderate pain or severe pain.     pantoprazole 40 MG tablet  Commonly known as:  PROTONIX  Take 40 mg by mouth daily.     potassium chloride SA 20 MEQ tablet  Commonly known as:  K-DUR,KLOR-CON  Take 20 mEq by mouth daily.     valsartan 160 MG tablet  Commonly known as:  DIOVAN  Take 160 mg by mouth daily.     warfarin 2.5 MG tablet  Commonly known as:  COUMADIN  Take 2.5 mg by mouth once a week. Patient takes once a week with '5mg'$  tablet for a total of 7.'5mg'$ .     warfarin 5 MG tablet  Commonly known as:  COUMADIN  TAKE 1  TABLET (5 MG TOTAL) BY MOUTH ONCE DAILY.        Followup:  Follow-up Information    Follow up with Hollice Espy, MD In 6 weeks.   Specialty:  Urology   Why:  PVR   Contact information:   [address] [place] [address] [phone]

## 2015-02-21 NOTE — Discharge Instructions (Signed)
Transurethral Resection of the Prostate, Care After Refer to this sheet in the next few weeks. These instructions provide you with information on caring for yourself after your procedure. Your caregiver also may give you specific instructions. Your treatment has been planned according to current medical practices, but complications sometimes occur. Call your caregiver if you have any problems or questions after your procedure. HOME CARE INSTRUCTIONS  Recovery can take 4-6 weeks. Avoid alcohol, caffeinated drinks, and spicy foods for 2 weeks after your procedure. Drink enough fluids to keep your urine clear or pale yellow. Urinate as soon as you feel the urge to do so. Do not try to hold your urine for long periods of time. During recovery you may experience pain caused by bladder spasms, which result in a very intense urge to urinate. Take all medicines as directed by your caregiver, including medicines for pain. Try to limit the amount of pain medicines you take because it can cause constipation. If you do become constipated, do not strain to move your bowels. Straining can increase bleeding. Constipation can be minimized by increasing the amount fluids and fiber in your diet. Your caregiver also may prescribe a stool softener. Do not lift heavy objects (more than 5 lb [2.25 kg]) or perform exercises that cause you to strain for at least 1 month after your procedure. When sitting, you may want to sit in a soft chair or use a cushion. For the first 10 days after your procedure, avoid the following activities:  Running.  Strenuous work.  Long walks.  Riding in a car for extended periods.  Sex. SEEK MEDICAL CARE IF:  You have difficulty urinating.  You have blood in your urine that does not go away after you rest or increase your fluid intake.  You have swelling in your penis or scrotum. SEEK IMMEDIATE MEDICAL CARE IF:   You are suddenly unable to urinate.  You notice blood clots in your  urine.  You have chills.  You have a fever.  You have pain in your back or lower abdomen.  You have pain or swelling in your legs. MAKE SURE YOU:   Understand these instructions.  Will watch your condition.  Will get help right away if you are not doing well or get worse.   You may restart your Coumadin starting the day of discharge on your normal schedule. I have recommended checking an INR either Friday or Monday to ensure that he's reached therapeutic levels.  He may resume your Plavix as soon as your urine is clear or in 7 days, which ever is first.

## 2015-02-26 ENCOUNTER — Emergency Department
Admission: EM | Admit: 2015-02-26 | Discharge: 2015-02-26 | Disposition: A | Discharge status: Home / Self Care | Payer: Medicare PPO | Attending: Emergency Medicine | Admitting: Emergency Medicine

## 2015-02-26 ENCOUNTER — Emergency Department: Payer: Medicare PPO

## 2015-02-26 DIAGNOSIS — R778 Other specified abnormalities of plasma proteins: Secondary | ICD-10-CM

## 2015-02-26 DIAGNOSIS — R509 Fever, unspecified: Secondary | ICD-10-CM

## 2015-02-26 DIAGNOSIS — Z7902 Long term (current) use of antithrombotics/antiplatelets: Secondary | ICD-10-CM | POA: Insufficient documentation

## 2015-02-26 DIAGNOSIS — E119 Type 2 diabetes mellitus without complications: Secondary | ICD-10-CM | POA: Insufficient documentation

## 2015-02-26 DIAGNOSIS — Z79899 Other long term (current) drug therapy: Secondary | ICD-10-CM | POA: Diagnosis not present

## 2015-02-26 DIAGNOSIS — F17210 Nicotine dependence, cigarettes, uncomplicated: Secondary | ICD-10-CM | POA: Insufficient documentation

## 2015-02-26 DIAGNOSIS — R079 Chest pain, unspecified: Secondary | ICD-10-CM | POA: Insufficient documentation

## 2015-02-26 DIAGNOSIS — R05 Cough: Secondary | ICD-10-CM

## 2015-02-26 DIAGNOSIS — R7989 Other specified abnormal findings of blood chemistry: Secondary | ICD-10-CM | POA: Insufficient documentation

## 2015-02-26 DIAGNOSIS — Z880 Allergy status to penicillin: Secondary | ICD-10-CM | POA: Insufficient documentation

## 2015-02-26 DIAGNOSIS — R059 Cough, unspecified: Secondary | ICD-10-CM

## 2015-02-26 DIAGNOSIS — J069 Acute upper respiratory infection, unspecified: Secondary | ICD-10-CM | POA: Insufficient documentation

## 2015-02-26 DIAGNOSIS — R0602 Shortness of breath: Secondary | ICD-10-CM | POA: Diagnosis present

## 2015-02-26 DIAGNOSIS — Z7901 Long term (current) use of anticoagulants: Secondary | ICD-10-CM | POA: Insufficient documentation

## 2015-02-26 DIAGNOSIS — I10 Essential (primary) hypertension: Secondary | ICD-10-CM | POA: Insufficient documentation

## 2015-02-26 DIAGNOSIS — Z7951 Long term (current) use of inhaled steroids: Secondary | ICD-10-CM | POA: Diagnosis not present

## 2015-02-26 LAB — RAPID INFLUENZA A&B ANTIGENS (ARMC ONLY): INFLUENZA A (ARMC): NOT DETECTED

## 2015-02-26 LAB — TROPONIN I
TROPONIN I: 0.06 ng/mL — AB (ref <0.031)
Troponin I: 0.04 ng/mL — ABNORMAL HIGH (ref <0.031)

## 2015-02-26 LAB — BASIC METABOLIC PANEL
Anion gap: 10 (ref 5–15)
BUN: 17 mg/dL (ref 6–20)
CHLORIDE: 103 mmol/L (ref 101–111)
CO2: 23 mmol/L (ref 22–32)
CREATININE: 0.97 mg/dL (ref 0.61–1.24)
Calcium: 8.4 mg/dL — ABNORMAL LOW (ref 8.9–10.3)
GFR calc Af Amer: >60 mL/min (ref >60)
GFR calc non Af Amer: >60 mL/min (ref >60)
Glucose, Bld: 106 mg/dL — ABNORMAL HIGH (ref 65–99)
POTASSIUM: 3.7 mmol/L (ref 3.5–5.1)
SODIUM: 136 mmol/L (ref 135–145)

## 2015-02-26 LAB — CBC
HEMATOCRIT: 40.2 % (ref 40.0–52.0)
Hemoglobin: 13.6 g/dL (ref 13.0–18.0)
MCH: 27.2 pg (ref 26.0–34.0)
MCHC: 33.7 g/dL (ref 32.0–36.0)
MCV: 80.9 fL (ref 80.0–100.0)
PLATELETS: 162 10*3/uL (ref 150–440)
RBC: 4.97 MIL/uL (ref 4.40–5.90)
RDW: 17.3 % — ABNORMAL HIGH (ref 11.5–14.5)
WBC: 8.4 10*3/uL (ref 3.8–10.6)

## 2015-02-26 LAB — PROTIME-INR
INR: 1.22
Prothrombin Time: 15.6 seconds — ABNORMAL HIGH (ref 11.4–15.0)

## 2015-02-26 LAB — RAPID INFLUENZA A&B ANTIGENS: Influenza B (ARMC): NOT DETECTED

## 2015-02-26 MED ORDER — ACETAMINOPHEN 325 MG PO TABS
ORAL_TABLET | ORAL | Status: AC
  Filled 2015-02-26: qty 1

## 2015-02-26 MED ORDER — ACETAMINOPHEN 325 MG PO TABS
650.0000 mg | ORAL_TABLET | Freq: Once | ORAL | Status: AC
  Administered 2015-02-26: 650 mg via ORAL
  Filled 2015-02-26: qty 2

## 2015-02-26 MED ORDER — IPRATROPIUM-ALBUTEROL 0.5-2.5 (3) MG/3ML IN SOLN
3.0000 mL | Freq: Once | RESPIRATORY_TRACT | Status: AC
  Administered 2015-02-26: 3 mL via RESPIRATORY_TRACT
  Filled 2015-02-26: qty 3

## 2015-02-26 MED ORDER — BENZONATATE 100 MG PO CAPS: 100.0000 mg | ORAL_CAPSULE | Freq: Three times a day (TID) | ORAL | Status: AC | PRN

## 2015-02-26 MED ORDER — ALBUTEROL SULFATE HFA 108 (90 BASE) MCG/ACT IN AERS: 2.0000 | INHALATION_SPRAY | Freq: Four times a day (QID) | RESPIRATORY_TRACT | Status: DC | PRN

## 2015-02-26 NOTE — Discharge Instructions (Signed)
As we discussed your marker for heart damage was elevated today, although there can be a number of reasons for this, we cannot tell you for sure it was not a heart attack. Please follow up with your cardiologist tomorrow. Please seek medical attention for any high fevers, chest pain, shortness of breath, change in behavior, persistent vomiting, bloody stool or any other new or concerning symptoms.   Upper Respiratory Infection, Adult Most upper respiratory infections (URIs) are caused by a virus. A URI affects the nose, throat, and upper air passages. The most common type of URI is often called "the common cold." HOME CARE   Take medicines only as told by your doctor.  Gargle warm saltwater or take cough drops to comfort your throat as told by your doctor.  Use a warm mist humidifier or inhale steam from a shower to increase air moisture. This may make it easier to breathe.  Drink enough fluid to keep your pee (urine) clear or pale yellow.  Eat soups and other clear broths.  Have a healthy diet.  Rest as needed.  Go back to work when your fever is gone or your doctor says it is okay.  You may need to stay home longer to avoid giving your URI to others.  You can also wear a face mask and wash your hands often to prevent spread of the virus.  Use your inhaler more if you have asthma.  Do not use any tobacco products, including cigarettes, chewing tobacco, or electronic cigarettes. If you need help quitting, ask your doctor. GET HELP IF:  You are getting worse, not better.  Your symptoms are not helped by medicine.  You have chills.  You are getting more short of breath.  You have brown or red mucus.  You have yellow or brown discharge from your nose.  You have pain in your face, especially when you bend forward.  You have a fever.  You have puffy (swollen) neck glands.  You have pain while swallowing.  You have white areas in the back of your throat. GET HELP RIGHT  AWAY IF:   You have very bad or constant:  Headache.  Ear pain.  Pain in your forehead, behind your eyes, and over your cheekbones (sinus pain).  Chest pain.  You have long-lasting (chronic) lung disease and any of the following:  Wheezing.  Long-lasting cough.  Coughing up blood.  A change in your usual mucus.  You have a stiff neck.  You have changes in your:  Vision.  Hearing.  Thinking.  Mood. MAKE SURE YOU:   Understand these instructions.  Will watch your condition.  Will get help right away if you are not doing well or get worse.   This information is not intended to replace advice given to you by your health care provider. Make sure you discuss any questions you have with your health care provider.   Document Released: 06/12/2007 Document Revised: 05/10/2014 Document Reviewed: 03/31/2013 Elsevier Interactive Patient Education [employer].

## 2015-02-26 NOTE — ED Provider Notes (Signed)
[facility] Emergency Department Provider Note    ____________________________________________  Time seen: ~1540  I have reviewed the triage vital signs and the nursing notes.   HISTORY  Chief Complaint Shortness of Breath   History limited by: Not Limited   HPI Martin Adkins is a 70 y.o. male who presents to the emergency department today because of concerns for cough and shortness of breath. The patient states that the symptoms started4 days ago. They have been constant. They've progressively gotten worse. They are accompanied with some associated pain in the central chest and abdomen which the patient thinks is secondary to persistent cough. The patient went to primary care 2 days ago and was given a prescription for Levaquin which the patient states has not helped. He has also had fevers for which Tylenol will give temporary relief. He does not feel like this feels like his previous heart attacks.     Past Medical History  Diagnosis Date  . Hyperlipemia   . RA (rheumatoid arthritis) ([place])   . OSA (obstructive sleep apnea)   . PVD (peripheral vascular disease) ([place])   . GERD (gastroesophageal reflux disease)   . Osteoarthritis   . NSTEMI (non-ST elevated myocardial infarction) ([place])   . Hyperplastic polyps of stomach   . Barrett's esophagus   . Diabetes ([place])   . DVT (deep venous thrombosis) ([place])   . Abdominal aortic aneurysm without rupture ([place])   . CAD (coronary artery disease)   . SOB (shortness of breath)   . CHF (congestive heart failure) ([place])   . Hypertension   . Arthritis   . BPH (benign prostatic hyperplasia)   . AAA (abdominal aortic aneurysm) without rupture ([place]) 04/05/2014  . Benign essential HTN 05/02/2014  . Arteriosclerosis of coronary artery 04/05/2014    Overview:  Sp cabg with lima to lad svg to d1 om1 rca 2004 with occluded svg to rca and d1 and pci stetn of om1 graft 2015   . Chronic systolic heart failure ([place])  04/19/2014    Overview:  With segmental LV systolic dysfunction ejection fraction of 35%   . Antepartum deep phlebothrombosis ([place]) 07/28/2014  . Acute non-ST elevation myocardial infarction (NSTEMI) ([place]) 08/15/2014  . Myocardial infarction ([place]) 07/30/2014  . AAA (abdominal aortic aneurysm) without rupture ([place]) 04/05/2014  . APS (antiphospholipid syndrome) ([place])   . History of shingles 2013    Patient Active Problem List   Diagnosis Date Noted  . Deep venous thrombosis of profunda femoris vein ([place]) 01/23/2015  . BPH (benign prostatic hyperplasia) 01/23/2015  . Urge urinary incontinence 01/23/2015  . Type 2 diabetes mellitus ([place]) 10/31/2014  . Benign gastric polyp 10/31/2014  . Absence of bladder continence 10/09/2014  . Acute non-ST elevation myocardial infarction (NSTEMI) ([place]) 08/15/2014  . Atrial fibrillation ([place]) 08/05/2014  . Antiphospholipid syndrome ([place]) 08/01/2014  . H/O deep venous thrombosis 08/01/2014  . Myocardial infarction ([place]) 07/30/2014  . Barrett esophagus 07/28/2014  . Diabetes mellitus, type 2 ([place]) 07/28/2014  . Antepartum deep phlebothrombosis ([place]) 07/28/2014  . Hyperplastic adenomatous polyp of stomach 07/28/2014  . Benign essential HTN 05/02/2014  . Chronic systolic heart failure ([place]) 04/19/2014  . AAA (abdominal aortic aneurysm) without rupture ([place]) 04/05/2014  . Arteriosclerosis of coronary artery 04/05/2014  . Breathlessness on exertion 04/05/2014  . Abdominal aortic aneurysm (AAA) without rupture ([place]) 04/05/2014  . Acute non-ST segment elevation myocardial infarction ([place]) 05/14/2013  . Acid reflux 05/04/2013  . Combined fat and carbohydrate induced hyperlipemia 05/03/2013  .  Obstructive apnea 05/03/2013  . Arthritis, degenerative 05/03/2013  . Peripheral blood vessel disorder ([place]) 05/03/2013  . Arthritis or polyarthritis, rheumatoid ([place]) 05/03/2013  . Compulsive tobacco user syndrome 05/03/2013  . Rheumatoid arthritis ([place]) 05/03/2013  .  Peripheral vascular disease ([place]) 05/03/2013  . Current tobacco use 05/03/2013    Past Surgical History  Procedure Laterality Date  . Coronary angioplasty with stent placement  2015  . Coronary artery bypass graft    . Abdominal aorta stent    . Peripheral vascular catheterization N/A 09/06/2014    Procedure: IVC Filter Removal;  Surgeon: Katha Cabal, MD;  Location: [facility];  Service: Cardiovascular;  Laterality: N/A;  . Cholecystectomy    . Transurethral resection of prostate N/A 02/20/2015    Procedure: TRANSURETHRAL RESECTION OF THE PROSTATE (TURP);  Surgeon: Hollice Espy, MD;  Location: [facility] ORS;  Service: Urology;  Laterality: N/A;    Current Outpatient Rx  Name  Route  Sig  Dispense  Refill  . amLODipine (NORVASC) 10 MG tablet   Oral   Take 10 mg by mouth daily.          Marland Kitchen atorvastatin (LIPITOR) 80 MG tablet      TAKE 1 TABLET (80 MG TOTAL) BY MOUTH ONCE DAILY.      11   . chlorthalidone (HYGROTON) 25 MG tablet   Oral   Take 25 mg by mouth daily.          . clopidogrel (PLAVIX) 75 MG tablet      Reported on 01/23/2015      11   . docusate sodium (COLACE) 100 MG capsule   Oral   Take 1 capsule (100 mg total) by mouth 2 (two) times daily.   60 capsule   0   . fluticasone (FLONASE) 50 MCG/ACT nasal spray   Each Nare   Place 2 sprays into both nostrils daily.          . isosorbide mononitrate (IMDUR) 30 MG 24 hr tablet   Oral   Take 30 mg by mouth daily.          . LUBRICANT EYE DROPS 0.4-0.3 % SOLN   Ophthalmic   Apply 1 drop to eye daily.            Dispense as written.   . metoprolol succinate (TOPROL-XL) 100 MG 24 hr tablet   Oral   Take 100 mg by mouth daily.          . mirabegron ER (MYRBETRIQ) 25 MG TB24 tablet   Oral   Take 1 tablet (25 mg total) by mouth daily. Patient not taking: Reported on 01/23/2015   30 tablet   0   . Naphazoline-Polyethyl Glycol 0.012-0.2 % SOLN   Both Eyes   Place 1 drop into both  eyes daily. Reported on 02/20/2015         . NASAL DECONGESTANT SPRAY 0.05 % nasal spray                 Dispense as written.   Marland Kitchen Neomycin-Bacitracin-Polymyxin (HCA TRIPLE ANTIBIOTIC OINTMENT EX)      Reported on 02/20/2015         . oxyCODONE-acetaminophen (PERCOCET) 5-325 MG tablet   Oral   Take 1-2 tablets by mouth every 4 (four) hours as needed for moderate pain or severe pain.   20 tablet   0   . pantoprazole (PROTONIX) 40 MG tablet   Oral   Take 40 mg  by mouth daily.          . Polyvinyl Alcohol (LUBRICANT DROPS OP)   Both Eyes   Place 1 drop into both eyes daily.         . potassium chloride SA (K-DUR,KLOR-CON) 20 MEQ tablet   Oral   Take 20 mEq by mouth daily.          . valsartan (DIOVAN) 160 MG tablet   Oral   Take 160 mg by mouth daily.         Marland Kitchen warfarin (COUMADIN) 2.5 MG tablet   Oral   Take 2.5 mg by mouth once a week. Patient takes once a week with 5mg  tablet for a total of 7.5mg .         . warfarin (COUMADIN) 5 MG tablet      TAKE 1 TABLET (5 MG TOTAL) BY MOUTH ONCE DAILY.      5     Allergies Ace inhibitors; Pravastatin; Rosuvastatin; Simvastatin; Amoxicillin; Niacin; and Penicillins  Family History  Problem Relation Age of Onset  . Bladder Cancer Neg Hx   . Prostate cancer Neg Hx   . Kidney cancer Neg Hx     Social History Social History  Substance Use Topics  . Smoking status: Current Some Day Smoker -- 0.25 packs/day for 20 years    Types: Cigarettes  . Smokeless tobacco: Never Used  . Alcohol Use: 0.0 oz/week    0 Standard drinks or equivalent per week     Comment: social on weekends    Review of Systems  Constitutional: Positive for fever. Cardiovascular: Positive for central chest pain Respiratory: Positive for cough. Positive for shortness of breath. Gastrointestinal: Negative for abdominal pain, vomiting and diarrhea. Neurological: Negative for headaches, focal weakness or numbness.   10-point ROS  otherwise negative.  ____________________________________________   PHYSICAL EXAM:  VITAL SIGNS: ED Triage Vitals  Enc Vitals Group     BP 02/26/15 1353 115/73 mmHg     Pulse Rate 02/26/15 1353 50     Resp 02/26/15 1353 20     Temp 02/26/15 1353 102 F (38.9 C)     Temp Source 02/26/15 1353 Oral     SpO2 02/26/15 1353 93 %     Weight 02/26/15 1353 210 lb (95.255 kg)     Height 02/26/15 1353 6' (1.829 m)     Head Cir --      Peak Flow --      Pain Score 02/26/15 1355 8   Constitutional: Alert and oriented. Well appearing and in no distress. Eyes: Conjunctivae are normal. PERRL. Normal extraocular movements. ENT   Head: Normocephalic and atraumatic.   Nose: No congestion/rhinnorhea.   Mouth/Throat: Mucous membranes are moist.   Neck: No stridor. Hematological/Lymphatic/Immunilogical: No cervical lymphadenopathy. Cardiovascular: Normal rate, regular rhythm.  No murmurs, rubs, or gallops. Respiratory: Normal respiratory effort without tachypnea nor retractions. Breath sounds are clear and equal bilaterally. No wheezes/rales/rhonchi. Occasional cough. Gastrointestinal: Soft and nontender. No distention. There is no CVA tenderness. Genitourinary: Deferred Musculoskeletal: Normal range of motion in all extremities. No joint effusions.  No lower extremity tenderness nor edema. Neurologic:  Normal speech and language. No gross focal neurologic deficits are appreciated.  Skin:  Skin is warm, dry and intact. No rash noted. Psychiatric: Mood and affect are normal. Speech and behavior are normal. Patient exhibits appropriate insight and judgment.  ____________________________________________    LABS (pertinent positives/negatives)  Labs Reviewed  BASIC METABOLIC PANEL - Abnormal; Notable for the  following:    Glucose, Bld 106 (*)    Calcium 8.4 (*)    All other components within normal limits  CBC - Abnormal; Notable for the following:    RDW 17.3 (*)    All other  components within normal limits  TROPONIN I - Abnormal; Notable for the following:    Troponin I 0.04 (*)    All other components within normal limits  PROTIME-INR - Abnormal; Notable for the following:    Prothrombin Time 15.6 (*)    All other components within normal limits  TROPONIN I - Abnormal; Notable for the following:    Troponin I 0.06 (*)    All other components within normal limits  RAPID INFLUENZA A&B ANTIGENS ([facility] ONLY)     ____________________________________________   EKG  I, Nance Pear, attending physician, personally viewed and interpreted this EKG  EKG Time: 1405 Rate: 105 Rhythm: sinus tachycardia with PAC Axis: normal Intervals: qtc 520 QRS: narrow ST changes: no st elevation Impression: abnormal ekg ____________________________________________    RADIOLOGY  CXR  IMPRESSION: 1. Cardiomegaly. No edema. 2. Mild bibasilar atelectasis.  ____________________________________________   PROCEDURES  Procedure(s) performed: None  Critical Care performed: No  ____________________________________________   INITIAL IMPRESSION / ASSESSMENT AND PLAN / ED COURSE  Pertinent labs & imaging results that were available during my care of the patient were reviewed by me and considered in my medical decision making (see chart for details).  Patient presented to the emergency department today with continued shortness of breath and cough. Patient was placed on antibiotics a couple of days ago for URI. Patient's symptoms are consistent with upper respiratory infection, likely viral nature. Influenza was negative. Of note however the patient's initial troponin was mildly elevated at 0.04 because of this repeat troponin was sent which was further elevated at 0.06. I had a discussion with patient and family seem that I cannot completely told that she was not having a heart attack. Her the patient did not feel like this pain was similar to his previous heart  attacks. I think there are other explanations including persistent cough, trauma to the cardiac muscle and strain on the heart. I did stress with the patient and family importance of following up with cardiology tomorrow. Will plan on giving patient cough medicine and an inhaler for suspected URI.  ____________________________________________   FINAL CLINICAL IMPRESSION(S) / ED DIAGNOSES  Final diagnoses:  Elevated troponin  Cough  Fever, unspecified fever cause  URI (upper respiratory infection)     Nance Pear, MD 02/26/15 [id number]

## 2015-02-26 NOTE — ED Notes (Signed)
Sent here from [facility] clinic Pt went to see PCP Friday diagnosed with URI on Levaquin. Pt reports sob and chest tightness. Dry cough and fever.

## 2015-03-03 DIAGNOSIS — R778 Other specified abnormalities of plasma proteins: Secondary | ICD-10-CM | POA: Insufficient documentation

## 2015-07-31 ENCOUNTER — Other Ambulatory Visit: Payer: Self-pay | Admitting: Rheumatology

## 2015-07-31 DIAGNOSIS — M542 Cervicalgia: Secondary | ICD-10-CM

## 2015-08-10 ENCOUNTER — Ambulatory Visit
Admission: RE | Admit: 2015-08-10 | Discharge: 2015-08-10 | Disposition: A | Discharge status: Home / Self Care | Payer: Medicare PPO | Source: Ambulatory Visit | Attending: Rheumatology | Admitting: Rheumatology

## 2015-08-10 DIAGNOSIS — M47812 Spondylosis without myelopathy or radiculopathy, cervical region: Secondary | ICD-10-CM | POA: Insufficient documentation

## 2015-08-10 DIAGNOSIS — M542 Cervicalgia: Secondary | ICD-10-CM | POA: Diagnosis present

## 2015-08-10 DIAGNOSIS — M2578 Osteophyte, vertebrae: Secondary | ICD-10-CM | POA: Insufficient documentation

## 2015-08-10 DIAGNOSIS — G629 Polyneuropathy, unspecified: Secondary | ICD-10-CM | POA: Insufficient documentation

## 2015-08-10 DIAGNOSIS — M50222 Other cervical disc displacement at C5-C6 level: Secondary | ICD-10-CM | POA: Diagnosis not present

## 2015-08-10 DIAGNOSIS — M50223 Other cervical disc displacement at C6-C7 level: Secondary | ICD-10-CM | POA: Insufficient documentation

## 2015-08-10 DIAGNOSIS — M50221 Other cervical disc displacement at C4-C5 level: Secondary | ICD-10-CM | POA: Insufficient documentation

## 2016-08-13 ENCOUNTER — Emergency Department: Payer: Medicare PPO

## 2016-08-13 ENCOUNTER — Emergency Department
Admission: EM | Admit: 2016-08-13 | Discharge: 2016-08-13 | Disposition: A | Discharge status: Home / Self Care | Payer: Medicare PPO | Attending: Emergency Medicine | Admitting: Emergency Medicine

## 2016-08-13 ENCOUNTER — Encounter: Payer: Self-pay | Admitting: Emergency Medicine

## 2016-08-13 DIAGNOSIS — R079 Chest pain, unspecified: Secondary | ICD-10-CM | POA: Insufficient documentation

## 2016-08-13 DIAGNOSIS — I5022 Chronic systolic (congestive) heart failure: Secondary | ICD-10-CM | POA: Diagnosis not present

## 2016-08-13 DIAGNOSIS — E119 Type 2 diabetes mellitus without complications: Secondary | ICD-10-CM | POA: Diagnosis not present

## 2016-08-13 DIAGNOSIS — Z7901 Long term (current) use of anticoagulants: Secondary | ICD-10-CM | POA: Diagnosis not present

## 2016-08-13 DIAGNOSIS — I2510 Atherosclerotic heart disease of native coronary artery without angina pectoris: Secondary | ICD-10-CM | POA: Diagnosis not present

## 2016-08-13 DIAGNOSIS — F17210 Nicotine dependence, cigarettes, uncomplicated: Secondary | ICD-10-CM | POA: Diagnosis not present

## 2016-08-13 DIAGNOSIS — R0602 Shortness of breath: Secondary | ICD-10-CM | POA: Diagnosis not present

## 2016-08-13 DIAGNOSIS — I10 Essential (primary) hypertension: Secondary | ICD-10-CM | POA: Insufficient documentation

## 2016-08-13 DIAGNOSIS — Z7982 Long term (current) use of aspirin: Secondary | ICD-10-CM | POA: Diagnosis not present

## 2016-08-13 DIAGNOSIS — Z7902 Long term (current) use of antithrombotics/antiplatelets: Secondary | ICD-10-CM | POA: Insufficient documentation

## 2016-08-13 DIAGNOSIS — I110 Hypertensive heart disease with heart failure: Secondary | ICD-10-CM | POA: Diagnosis not present

## 2016-08-13 DIAGNOSIS — Z79899 Other long term (current) drug therapy: Secondary | ICD-10-CM | POA: Insufficient documentation

## 2016-08-13 DIAGNOSIS — Z8673 Personal history of transient ischemic attack (TIA), and cerebral infarction without residual deficits: Secondary | ICD-10-CM | POA: Insufficient documentation

## 2016-08-13 LAB — BASIC METABOLIC PANEL
Anion gap: 12 (ref 5–15)
BUN: 34 mg/dL — ABNORMAL HIGH (ref 6–20)
CALCIUM: 9 mg/dL (ref 8.9–10.3)
CO2: 22 mmol/L (ref 22–32)
CREATININE: 1.99 mg/dL — AB (ref 0.61–1.24)
Chloride: 101 mmol/L (ref 101–111)
GFR calc Af Amer: 38 mL/min — ABNORMAL LOW (ref >60)
GFR, EST NON AFRICAN AMERICAN: 33 mL/min — AB (ref >60)
GLUCOSE: 127 mg/dL — AB (ref 65–99)
POTASSIUM: 3.8 mmol/L (ref 3.5–5.1)
Sodium: 135 mmol/L (ref 135–145)

## 2016-08-13 LAB — CBC
HEMATOCRIT: 46.3 % (ref 40.0–52.0)
Hemoglobin: 15.2 g/dL (ref 13.0–18.0)
MCH: 26 pg (ref 26.0–34.0)
MCHC: 32.8 g/dL (ref 32.0–36.0)
MCV: 79.3 fL — ABNORMAL LOW (ref 80.0–100.0)
PLATELETS: 145 10*3/uL — AB (ref 150–440)
RBC: 5.84 MIL/uL (ref 4.40–5.90)
RDW: 17.6 % — AB (ref 11.5–14.5)
WBC: 13.4 10*3/uL — AB (ref 3.8–10.6)

## 2016-08-13 LAB — TROPONIN I

## 2016-08-13 MED ORDER — DEXAMETHASONE 1 MG/ML PO CONC
10.0000 mg | Freq: Once | ORAL | Status: AC
  Administered 2016-08-13: 10 mg via ORAL
  Filled 2016-08-13: qty 10

## 2016-08-13 MED ORDER — IPRATROPIUM-ALBUTEROL 0.5-2.5 (3) MG/3ML IN SOLN
3.0000 mL | Freq: Once | RESPIRATORY_TRACT | Status: AC
  Administered 2016-08-13: 3 mL via RESPIRATORY_TRACT
  Filled 2016-08-13: qty 3

## 2016-08-13 MED ORDER — SODIUM CHLORIDE 0.9 % IV BOLUS (SEPSIS)
1000.0000 mL | Freq: Once | INTRAVENOUS | Status: AC
  Administered 2016-08-13: 1000 mL via INTRAVENOUS

## 2016-08-13 NOTE — ED Notes (Signed)
NAD noted at time of D/C. Pt denies questions or concerns. Pt ambulatory to the lobby at this time. Pt refused wheelchair to the lobby.  

## 2016-08-13 NOTE — ED Notes (Signed)
This RN to bedside to introduce self to patient. Pt assisted to the bathroom at this time by this RN, pt back to bed without incident. Pt is alert and oriented, respirations even and unlabored, skin warm, dry, and intact. 2nd troponin recollected by this RN. Pt denies further needs. Will continue to monitor for further patient needs.

## 2016-08-13 NOTE — ED Provider Notes (Addendum)
[facility] Emergency Department Provider Note   ____________________________________________   First MD Initiated Contact with Patient 08/13/16 [phone]     (approximate)  I have reviewed the triage vital signs and the nursing notes.   HISTORY  Chief Complaint Chest Pain    HPI Martin Adkins is a 71 y.o. male who comes into the hospital today with some shortness of breath and chest tightness. He is also had some tingling in his back under her shoulder blade and some burning in his legs. The patient states that the symptoms are not constant daily pain from time to time. He states they've been going on for a couple of weeks. The patient reports that he saw Dr. Pryor Ochoa when he was wheezing and they did a scope and didn't see a cause of the wheezing. He was referred to get an endoscopy. He reports that he couldn't rest tonight due to the shortness of breath with laying down so he decided to come in and get checked out. He reports that he was sweaty as well. The patient denies any pain right now but states that if he had to rate it would be a 2 out of 10 in intensity. He reports that he does have an inhaler and he did use it and it only helped his wheezing a little bit. The patient states that he is also breathing well right now. The patient came into the hospital today for evaluation.   Past Medical History:  Diagnosis Date  . AAA (abdominal aortic aneurysm) without rupture ([place]) 04/05/2014  . AAA (abdominal aortic aneurysm) without rupture ([place]) 04/05/2014  . Abdominal aortic aneurysm without rupture ([place])   . Acute non-ST elevation myocardial infarction (NSTEMI) ([place]) 08/15/2014  . Antepartum deep phlebothrombosis ([place]) 07/28/2014  . APS (antiphospholipid syndrome) ([place])   . Arteriosclerosis of coronary artery 04/05/2014   Overview:  Sp cabg with lima to lad svg to d1 om1 rca 2004 with occluded svg to rca and d1 and pci stetn of om1 graft 2015   . Arthritis   .  Barrett's esophagus   . Benign essential HTN 05/02/2014  . BPH (benign prostatic hyperplasia)   . CAD (coronary artery disease)   . CHF (congestive heart failure) ([place])   . Chronic systolic heart failure ([place]) 04/19/2014   Overview:  With segmental LV systolic dysfunction ejection fraction of 35%   . Diabetes ([place])   . DVT (deep venous thrombosis) ([place])   . GERD (gastroesophageal reflux disease)   . History of shingles 2013  . Hyperlipemia   . Hyperplastic polyps of stomach   . Hypertension   . Myocardial infarction ([place]) 07/30/2014  . NSTEMI (non-ST elevated myocardial infarction) ([place])   . OSA (obstructive sleep apnea)   . Osteoarthritis   . PVD (peripheral vascular disease) ([place])   . RA (rheumatoid arthritis) ([place])   . SOB (shortness of breath)     Patient Active Problem List   Diagnosis Date Noted  . Deep venous thrombosis of profunda femoris vein ([place]) 01/23/2015  . BPH (benign prostatic hyperplasia) 01/23/2015  . Urge urinary incontinence 01/23/2015  . Type 2 diabetes mellitus ([place]) 10/31/2014  . Benign gastric polyp 10/31/2014  . Absence of bladder continence 10/09/2014  . Acute non-ST elevation myocardial infarction (NSTEMI) ([place]) 08/15/2014  . Atrial fibrillation ([place]) 08/05/2014  . Antiphospholipid syndrome ([place]) 08/01/2014  . H/O deep venous thrombosis 08/01/2014  . Myocardial infarction ([place]) 07/30/2014  . Barrett esophagus 07/28/2014  . Diabetes mellitus,  type 2 ([place]) 07/28/2014  . Antepartum deep phlebothrombosis ([place]) 07/28/2014  . Hyperplastic adenomatous polyp of stomach 07/28/2014  . Benign essential HTN 05/02/2014  . Chronic systolic heart failure ([place]) 04/19/2014  . AAA (abdominal aortic aneurysm) without rupture ([place]) 04/05/2014  . Arteriosclerosis of coronary artery 04/05/2014  . Breathlessness on exertion 04/05/2014  . Abdominal aortic aneurysm (AAA) without rupture ([place]) 04/05/2014  . Acute non-ST segment elevation myocardial infarction ([place])  05/14/2013  . Acid reflux 05/04/2013  . Combined fat and carbohydrate induced hyperlipemia 05/03/2013  . Obstructive apnea 05/03/2013  . Arthritis, degenerative 05/03/2013  . Peripheral blood vessel disorder ([place]) 05/03/2013  . Arthritis or polyarthritis, rheumatoid ([place]) 05/03/2013  . Compulsive tobacco user syndrome 05/03/2013  . Rheumatoid arthritis ([place]) 05/03/2013  . Peripheral vascular disease ([place]) 05/03/2013  . Current tobacco use 05/03/2013    Past Surgical History:  Procedure Laterality Date  . ABDOMINAL AORTA STENT    . CHOLECYSTECTOMY    . CORONARY ANGIOPLASTY WITH STENT PLACEMENT  2015  . CORONARY ARTERY BYPASS GRAFT    . PERIPHERAL VASCULAR CATHETERIZATION N/A 09/06/2014   Procedure: IVC Filter Removal;  Surgeon: Katha Cabal, MD;  Location: [facility];  Service: Cardiovascular;  Laterality: N/A;  . TRANSURETHRAL RESECTION OF PROSTATE N/A 02/20/2015   Procedure: TRANSURETHRAL RESECTION OF THE PROSTATE (TURP);  Surgeon: Hollice Espy, MD;  Location: [facility] ORS;  Service: Urology;  Laterality: N/A;    Prior to Admission medications   Medication Sig Start Date End Date Taking? Authorizing Provider  albuterol (PROVENTIL HFA;VENTOLIN HFA) 108 (90 Base) MCG/ACT inhaler Inhale 2 puffs into the lungs every 6 (six) hours as needed for wheezing or shortness of breath. 02/26/15  Yes Nance Pear, MD  amLODipine (NORVASC) 10 MG tablet Take 10 mg by mouth daily.  11/19/14  Yes [provider]  aspirin EC 81 MG tablet Take 81 mg by mouth daily.   Yes [provider]  atorvastatin (LIPITOR) 80 MG tablet TAKE 1 TABLET (80 MG TOTAL) BY MOUTH ONCE DAILY. 08/26/14  Yes [provider]  chlorthalidone (HYGROTON) 25 MG tablet Take 25 mg by mouth daily.  07/28/14  Yes [provider]  fluticasone (FLONASE) 50 MCG/ACT nasal spray Place 2 sprays into both nostrils daily.  05/24/14  Yes [provider]  gabapentin (NEURONTIN) 300 MG  capsule Take 300 mg by mouth 2 (two) times daily.   Yes [provider]  hydroxychloroquine (PLAQUENIL) 200 MG tablet Take 1 tablet by mouth daily. 06/27/16  Yes [provider]  isosorbide mononitrate (IMDUR) 30 MG 24 hr tablet Take 30 mg by mouth daily.  07/25/14  Yes [provider]  LUBRICANT EYE DROPS 0.4-0.3 % SOLN Apply 1 drop to eye daily.  08/24/14  Yes [provider]  metoprolol succinate (TOPROL-XL) 100 MG 24 hr tablet Take 100 mg by mouth daily.  05/17/14  Yes [provider]  nitroGLYCERIN (NITROSTAT) 0.4 MG SL tablet Place 1 tablet (0.4 mg total) under the tongue every 5 (five) minutes as needed for Chest pain. May take up to 3 doses. 04/10/16 04/10/17 Yes [provider]  pantoprazole (PROTONIX) 40 MG tablet Take 40 mg by mouth daily.  07/07/14  Yes [provider]  potassium chloride SA (K-DUR,KLOR-CON) 20 MEQ tablet Take 20 mEq by mouth daily.  07/25/14  Yes [provider]  valsartan (DIOVAN) 160 MG tablet Take 160 mg by mouth 2 (two) times daily.    Yes [provider]  warfarin (COUMADIN) 1 MG tablet Take by mouth daily. Patient takes once a week with 5mg  tablet for a total of 7.5mg . 07/27/14  Yes [provider]  warfarin (COUMADIN) 5 MG tablet TAKE 1 TABLET (5 MG TOTAL) BY MOUTH ONCE DAILY. 09/14/14  Yes [provider]  clopidogrel (PLAVIX) 75 MG tablet Reported on 01/23/2015 11/09/14   [provider]  docusate sodium (COLACE) 100 MG capsule Take 1 capsule (100 mg total) by mouth 2 (two) times daily. Patient not taking: Reported on 08/13/2016 02/21/15   Hollice Espy, MD  mirabegron ER (MYRBETRIQ) 25 MG TB24 tablet Take 1 tablet (25 mg total) by mouth daily. Patient not taking: Reported on 01/23/2015 10/05/14   Zara Council A, PA-C  Neomycin-Bacitracin-Polymyxin (HCA TRIPLE ANTIBIOTIC OINTMENT EX) Reported on 02/20/2015 10/08/14   [provider]  oxyCODONE-acetaminophen  (PERCOCET) 5-325 MG tablet Take 1-2 tablets by mouth every 4 (four) hours as needed for moderate pain or severe pain. 02/21/15   Hollice Espy, MD    Allergies Ace inhibitors; Pravastatin; Rosuvastatin; Simvastatin; Amoxicillin; Niacin; and Penicillins  Family History  Problem Relation Age of Onset  . Bladder Cancer Neg Hx   . Prostate cancer Neg Hx   . Kidney cancer Neg Hx     Social History Social History  Substance Use Topics  . Smoking status: Current Some Day Smoker    Packs/day: 0.25    Years: 20.00    Types: Cigarettes  . Smokeless tobacco: Never Used  . Alcohol use 0.0 oz/week     Comment: social on weekends    Review of Systems  Constitutional: No fever/chills Eyes: No visual changes. ENT: No sore throat. Cardiovascular:  chest pain. Respiratory:  shortness of breath. Gastrointestinal: No abdominal pain.  No nausea, no vomiting.  No diarrhea.  No constipation. Genitourinary: Negative for dysuria. Musculoskeletal: Leg burning. Skin: Negative for rash. Neurological: Negative for headaches, focal weakness or numbness.   ____________________________________________   PHYSICAL EXAM:  VITAL SIGNS: ED Triage Vitals [08/13/16 0330]  Enc Vitals Group     BP 117/85     Pulse Rate 71     Resp (!) 22     Temp      Temp src      SpO2 96 %     Weight 210 lb (95.3 kg)     Height      Head Circumference      Peak Flow      Pain Score      Pain Loc      Pain Edu?      Excl. in [place]?     Constitutional: Alert and oriented. Well appearing and in Mild distress. Eyes: Conjunctivae are normal. PERRL. EOMI. Head: Atraumatic. Nose: No congestion/rhinnorhea. Mouth/Throat: Mucous membranes are moist.  Oropharynx non-erythematous. Cardiovascular: Normal rate, regular rhythm. Grossly normal heart sounds.  Good peripheral circulation. Respiratory: Normal respiratory effort.  No retractions. Lungs CTAB. Gastrointestinal: Soft and nontender. No distention. Positive  bowel sounds Musculoskeletal: No lower extremity tenderness nor edema.  Neurologic:  Normal speech and language.  Skin:  Skin is warm, dry and intact.  Psychiatric: Mood and affect are normal.   ____________________________________________   LABS (all labs ordered are listed, but only abnormal results are displayed)  Labs Reviewed  BASIC METABOLIC PANEL - Abnormal; Notable for the following:       Result Value   Glucose, Bld 127 (*)    BUN 34 (*)    Creatinine, Ser 1.99 (*)  GFR calc non Af Amer 33 (*)    GFR calc Af Amer 38 (*)    All other components within normal limits  CBC - Abnormal; Notable for the following:    WBC 13.4 (*)    MCV 79.3 (*)    RDW 17.6 (*)    Platelets 145 (*)    All other components within normal limits  TROPONIN I  TROPONIN I   ____________________________________________  EKG  ED ECG REPORT I, Loney Hering, the attending physician, personally viewed and interpreted this ECG.   Date: 08/13/2016  EKG Time: 0317  Rate: 83  Rhythm: normal sinus rhythm  Axis: normal  Intervals:none  ST&T Change: none  ED ECG REPORT#2 I, Loney Hering, the attending physician, personally viewed and interpreted this ECG.   Date: 08/13/2016  EKG Time: 317  Rate: 83  Rhythm: normal sinus rhythm, occasional PVC noted, unifocal  Axis: normal  Intervals:none  ST&T Change: no STEMI   ____________________________________________  RADIOLOGY  Dg Chest Port 1 View  Result Date: 08/13/2016 CLINICAL DATA:  Acute onset of generalized chest pain. Initial encounter. EXAM: PORTABLE CHEST 1 VIEW COMPARISON:  Chest radiograph performed 02/26/2015 FINDINGS: The lungs are well-aerated. The patient is status post median sternotomy, with evidence of prior CABG. There is no evidence of focal opacification, pleural effusion or pneumothorax. The cardiomediastinal silhouette is within normal limits. No acute osseous abnormalities are seen. IMPRESSION: No acute  cardiopulmonary process seen. Electronically Signed   By: Garald Balding M.D.   On: 08/13/2016 03:55    ____________________________________________   PROCEDURES  Procedure(s) performed: None  Procedures  Critical Care performed: No  ____________________________________________   INITIAL IMPRESSION / ASSESSMENT AND PLAN / ED COURSE  Pertinent labs & imaging results that were available during my care of the patient were reviewed by me and considered in my medical decision making (see chart for details).  This is a 71 year old male who comes into the hospital today with some chest tightness leg burning back tingling and some shortness of breath. The patient reports that his symptoms have improved. The patient has no wheezing on exam has no significant pain at this time and his legs are not burning. He has been having these symptoms for multiple weeks. I did do some blood work as well as a chest x-ray. I will reassess the patient once I received the results of his blood work and a repeat troponin. I did give the patient some normal saline as his creatinine is 1.99 which is up from approximately one year ago when it was 0.7.     The patient was resting comfortably when I checked on him. He reports that he still occasionally has some feelings like he can't catch his breath but no distress and no hypoxia. I will give the patient a DuoNeb treatment. He states that he recently completed a medrol dose pack taper for arthritis. I will give him some dexamethasone in the event that his shortness of breath is due to COPD. I will repeat the patient's troponin and his care was signed out to Dr. Corky Downs who will reassess the patient.  The patient's repeat blood work returned and is unremarkable. He will be discharged to home.  ____________________________________________   FINAL CLINICAL IMPRESSION(S) / ED DIAGNOSES  Final diagnoses:  Chest pain, unspecified type  Shortness of breath      NEW  MEDICATIONS STARTED DURING THIS VISIT:  New Prescriptions   No medications on file  Note:  This document was prepared using Dragon voice recognition software and may include unintentional dictation errors.    Loney Hering, MD 08/13/16 0758    Loney Hering, MD 08/13/16 [phone]

## 2016-08-13 NOTE — ED Triage Notes (Signed)
Pt to triage in wheelchair. Pt c/o left sided chest pain, accompanied with SOB and intermittent "stabbing" to the left side. Pt has HX of MI and stent placement. Pt regularly uses tobacco products and drinks alcohol daily. Pt denies N/V.

## 2016-09-11 ENCOUNTER — Other Ambulatory Visit (INDEPENDENT_AMBULATORY_CARE_PROVIDER_SITE_OTHER): Payer: Self-pay | Admitting: Vascular Surgery

## 2016-09-11 DIAGNOSIS — Z95828 Presence of other vascular implants and grafts: Secondary | ICD-10-CM

## 2016-09-12 ENCOUNTER — Ambulatory Visit (INDEPENDENT_AMBULATORY_CARE_PROVIDER_SITE_OTHER): Payer: Medicare PPO

## 2016-09-12 ENCOUNTER — Ambulatory Visit (INDEPENDENT_AMBULATORY_CARE_PROVIDER_SITE_OTHER): Payer: Self-pay | Admitting: Vascular Surgery

## 2016-09-12 ENCOUNTER — Encounter (INDEPENDENT_AMBULATORY_CARE_PROVIDER_SITE_OTHER): Payer: Medicare PPO

## 2016-09-12 DIAGNOSIS — Z95828 Presence of other vascular implants and grafts: Secondary | ICD-10-CM

## 2016-09-20 ENCOUNTER — Encounter (INDEPENDENT_AMBULATORY_CARE_PROVIDER_SITE_OTHER): Payer: Self-pay | Admitting: Vascular Surgery

## 2016-10-16 ENCOUNTER — Encounter (INDEPENDENT_AMBULATORY_CARE_PROVIDER_SITE_OTHER): Payer: Self-pay | Admitting: Vascular Surgery

## 2016-10-25 ENCOUNTER — Encounter: Payer: Self-pay | Admitting: *Deleted

## 2016-10-28 ENCOUNTER — Ambulatory Visit: Payer: Medicare PPO | Admitting: Anesthesiology

## 2016-10-28 ENCOUNTER — Ambulatory Visit
Admission: RE | Admit: 2016-10-28 | Discharge: 2016-10-28 | Disposition: A | Discharge status: Home / Self Care | Payer: Medicare PPO | Source: Ambulatory Visit | Attending: Unknown Physician Specialty | Admitting: Unknown Physician Specialty

## 2016-10-28 ENCOUNTER — Encounter
Admission: RE | Disposition: A | Discharge status: Home / Self Care | Payer: Self-pay | Source: Ambulatory Visit | Attending: Unknown Physician Specialty

## 2016-10-28 DIAGNOSIS — Z7901 Long term (current) use of anticoagulants: Secondary | ICD-10-CM | POA: Insufficient documentation

## 2016-10-28 DIAGNOSIS — Z880 Allergy status to penicillin: Secondary | ICD-10-CM | POA: Diagnosis not present

## 2016-10-28 DIAGNOSIS — I110 Hypertensive heart disease with heart failure: Secondary | ICD-10-CM | POA: Insufficient documentation

## 2016-10-28 DIAGNOSIS — M069 Rheumatoid arthritis, unspecified: Secondary | ICD-10-CM | POA: Insufficient documentation

## 2016-10-28 DIAGNOSIS — Z7902 Long term (current) use of antithrombotics/antiplatelets: Secondary | ICD-10-CM | POA: Insufficient documentation

## 2016-10-28 DIAGNOSIS — E785 Hyperlipidemia, unspecified: Secondary | ICD-10-CM | POA: Insufficient documentation

## 2016-10-28 DIAGNOSIS — I252 Old myocardial infarction: Secondary | ICD-10-CM | POA: Insufficient documentation

## 2016-10-28 DIAGNOSIS — G4733 Obstructive sleep apnea (adult) (pediatric): Secondary | ICD-10-CM | POA: Diagnosis not present

## 2016-10-28 DIAGNOSIS — Z79899 Other long term (current) drug therapy: Secondary | ICD-10-CM | POA: Diagnosis not present

## 2016-10-28 DIAGNOSIS — N400 Enlarged prostate without lower urinary tract symptoms: Secondary | ICD-10-CM | POA: Insufficient documentation

## 2016-10-28 DIAGNOSIS — F17210 Nicotine dependence, cigarettes, uncomplicated: Secondary | ICD-10-CM | POA: Insufficient documentation

## 2016-10-28 DIAGNOSIS — M1990 Unspecified osteoarthritis, unspecified site: Secondary | ICD-10-CM | POA: Insufficient documentation

## 2016-10-28 DIAGNOSIS — E1151 Type 2 diabetes mellitus with diabetic peripheral angiopathy without gangrene: Secondary | ICD-10-CM | POA: Diagnosis not present

## 2016-10-28 DIAGNOSIS — K219 Gastro-esophageal reflux disease without esophagitis: Secondary | ICD-10-CM | POA: Insufficient documentation

## 2016-10-28 DIAGNOSIS — I2510 Atherosclerotic heart disease of native coronary artery without angina pectoris: Secondary | ICD-10-CM | POA: Diagnosis not present

## 2016-10-28 DIAGNOSIS — I4891 Unspecified atrial fibrillation: Secondary | ICD-10-CM | POA: Insufficient documentation

## 2016-10-28 DIAGNOSIS — Z7982 Long term (current) use of aspirin: Secondary | ICD-10-CM | POA: Insufficient documentation

## 2016-10-28 DIAGNOSIS — K2270 Barrett's esophagus without dysplasia: Secondary | ICD-10-CM | POA: Diagnosis not present

## 2016-10-28 DIAGNOSIS — I5022 Chronic systolic (congestive) heart failure: Secondary | ICD-10-CM | POA: Insufficient documentation

## 2016-10-28 DIAGNOSIS — K2970 Gastritis, unspecified, without bleeding: Secondary | ICD-10-CM | POA: Insufficient documentation

## 2016-10-28 DIAGNOSIS — Z951 Presence of aortocoronary bypass graft: Secondary | ICD-10-CM | POA: Diagnosis not present

## 2016-10-28 HISTORY — DX: Cardiac arrhythmia, unspecified: I49.9

## 2016-10-28 HISTORY — PX: ESOPHAGOGASTRODUODENOSCOPY (EGD) WITH PROPOFOL: SHX5813

## 2016-10-28 HISTORY — DX: Personal history of other diseases of the digestive system: Z87.19

## 2016-10-28 SURGERY — ESOPHAGOGASTRODUODENOSCOPY (EGD) WITH PROPOFOL
Anesthesia: General

## 2016-10-28 MED ORDER — EPHEDRINE SULFATE 50 MG/ML IJ SOLN
INTRAMUSCULAR | Status: DC | PRN
  Administered 2016-10-28 (×2): 10 mg via INTRAVENOUS

## 2016-10-28 MED ORDER — LIDOCAINE HCL (PF) 2 % IJ SOLN
INTRAMUSCULAR | Status: AC
  Filled 2016-10-28: qty 10

## 2016-10-28 MED ORDER — SODIUM CHLORIDE 0.9 % IV SOLN
INTRAVENOUS | Status: DC
  Administered 2016-10-28 (×2): via INTRAVENOUS

## 2016-10-28 MED ORDER — FENTANYL CITRATE (PF) 100 MCG/2ML IJ SOLN
INTRAMUSCULAR | Status: AC
  Filled 2016-10-28: qty 2

## 2016-10-28 MED ORDER — SODIUM CHLORIDE 0.9 % IV SOLN: INTRAVENOUS | Status: DC

## 2016-10-28 MED ORDER — PROPOFOL 10 MG/ML IV BOLUS
INTRAVENOUS | Status: DC | PRN
  Administered 2016-10-28: 80 mg via INTRAVENOUS

## 2016-10-28 MED ORDER — PROPOFOL 500 MG/50ML IV EMUL
INTRAVENOUS | Status: DC | PRN
Start: 2016-10-28 — End: 2016-10-28
  Administered 2016-10-28: 100 ug/kg/min via INTRAVENOUS

## 2016-10-28 MED ORDER — PROPOFOL 10 MG/ML IV BOLUS
INTRAVENOUS | Status: AC
  Filled 2016-10-28: qty 20

## 2016-10-28 NOTE — H&P (Signed)
Primary Care Physician:  [name], MD Primary Gastroenterologist:  Dr. Vira Agar  Pre-Procedure History & Physical: HPI:  Martin Adkins is a 71 y.o. male is here for an endoscopy.   Past Medical History:  Diagnosis Date  . AAA (abdominal aortic aneurysm) without rupture ([place]) 04/05/2014  . AAA (abdominal aortic aneurysm) without rupture ([place]) 04/05/2014  . Abdominal aortic aneurysm without rupture ([place])   . Acute non-ST elevation myocardial infarction (NSTEMI) ([place]) 08/15/2014  . 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/28/2014  . APS (antiphospholipid syndrome) ([place])   . Arteriosclerosis of coronary artery 04/05/2014   Overview:  Sp cabg with lima to lad svg to d1 om1 rca 2004 with occluded svg to rca and d1 and pci stetn of om1 graft 2015   . Arthritis   . Barrett's esophagus   . Benign essential HTN 05/02/2014  . BPH (benign prostatic hyperplasia)   . CAD (coronary artery disease)   . CHF (congestive heart failure) ([place])   . Chronic systolic heart failure ([place]) 04/19/2014   Overview:  With segmental LV systolic dysfunction ejection fraction of 35%   . Diabetes ([place])   . DVT (deep venous thrombosis) ([place])   . Dysrhythmia    ATRIAL FIB.  Marland Kitchen GERD (gastroesophageal reflux disease)   . History of hiatal hernia   . History of shingles 2013  . Hyperlipemia   . Hyperplastic polyps of stomach   . Hypertension   . Myocardial infarction ([place]) 07/30/2014  . NSTEMI (non-ST elevated myocardial infarction) ([place])   . OSA (obstructive sleep apnea)   . Osteoarthritis   . PVD (peripheral vascular disease) ([place])   . RA (rheumatoid arthritis) ([place])   . SOB (shortness of breath)     Past Surgical History:  Procedure Laterality Date  . ABDOMINAL AORTA STENT    . CHOLECYSTECTOMY    . COLONOSCOPY  11/24/2009  . CORONARY ANGIOPLASTY WITH STENT PLACEMENT  2015  . CORONARY ARTERY BYPASS GRAFT    . ESOPHAGOGASTRODUODENOSCOPY  05/26/02  03/23/12  . PERIPHERAL VASCULAR CATHETERIZATION N/A  09/06/2014   Procedure: IVC Filter Removal;  Surgeon: Katha Cabal, MD;  Location: [facility];  Service: Cardiovascular;  Laterality: N/A;  . TRANSURETHRAL RESECTION OF PROSTATE N/A 02/20/2015   Procedure: TRANSURETHRAL RESECTION OF THE PROSTATE (TURP);  Surgeon: Hollice Espy, MD;  Location: [facility] ORS;  Service: Urology;  Laterality: N/A;    Prior to Admission medications   Medication Sig Start Date End Date Taking? Authorizing Provider  amLODipine (NORVASC) 10 MG tablet Take 10 mg by mouth daily.  11/19/14  Yes [provider]  aspirin EC 81 MG tablet Take 81 mg by mouth daily.   Yes [provider]  atorvastatin (LIPITOR) 80 MG tablet TAKE 1 TABLET (80 MG TOTAL) BY MOUTH ONCE DAILY. 08/26/14  Yes [provider]  chlorthalidone (HYGROTON) 25 MG tablet Take 25 mg by mouth daily.  07/28/14  Yes [provider]  fluticasone (FLONASE) 50 MCG/ACT nasal spray Place 2 sprays into both nostrils daily.  05/24/14  Yes [provider]  hydroxychloroquine (PLAQUENIL) 200 MG tablet Take 1 tablet by mouth daily. 06/27/16  Yes [provider]  isosorbide mononitrate (IMDUR) 30 MG 24 hr tablet Take 30 mg by mouth daily.  07/25/14  Yes [provider]  LUBRICANT EYE DROPS 0.4-0.3 % SOLN Apply 1 drop to eye daily.  08/24/14  Yes [provider]  pantoprazole (PROTONIX) 40 MG tablet Take 40 mg by mouth daily.  07/07/14  Yes [provider]  potassium chloride SA (K-DUR,KLOR-CON) 20 MEQ tablet Take 20 mEq by mouth daily.  07/25/14  Yes [provider]  valsartan (DIOVAN) 160 MG tablet Take 160 mg by mouth 2 (two) times daily.    Yes [provider]  albuterol (PROVENTIL HFA;VENTOLIN HFA) 108 (90 Base) MCG/ACT inhaler Inhale 2 puffs into the lungs every 6 (six) hours as needed for wheezing or shortness of breath. 02/26/15   Nance Pear, MD  clopidogrel (PLAVIX) 75 MG tablet Reported on 01/23/2015 11/09/14    [provider]  docusate sodium (COLACE) 100 MG capsule Take 1 capsule (100 mg total) by mouth 2 (two) times daily. Patient not taking: Reported on 08/13/2016 02/21/15   Hollice Espy, MD  gabapentin (NEURONTIN) 300 MG capsule Take 300 mg by mouth 2 (two) times daily.    [provider]  metoprolol succinate (TOPROL-XL) 100 MG 24 hr tablet Take 100 mg by mouth daily.  05/17/14   [provider]  mirabegron ER (MYRBETRIQ) 25 MG TB24 tablet Take 1 tablet (25 mg total) by mouth daily. Patient not taking: Reported on 01/23/2015 10/05/14   Zara Council A, PA-C  Neomycin-Bacitracin-Polymyxin (HCA TRIPLE ANTIBIOTIC OINTMENT EX) Reported on 02/20/2015 10/08/14   [provider]  nitroGLYCERIN (NITROSTAT) 0.4 MG SL tablet Place 1 tablet (0.4 mg total) under the tongue every 5 (five) minutes as needed for Chest pain. May take up to 3 doses. 04/10/16 04/10/17  [provider]  oxyCODONE-acetaminophen (PERCOCET) 5-325 MG tablet Take 1-2 tablets by mouth every 4 (four) hours as needed for moderate pain or severe pain. 02/21/15   Hollice Espy, MD  warfarin (COUMADIN) 1 MG tablet Take by mouth daily. Patient takes once a week with 5mg  tablet for a total of 7.5mg . 07/27/14   [provider]  warfarin (COUMADIN) 5 MG tablet TAKE 1 TABLET (5 MG TOTAL) BY MOUTH ONCE DAILY. 09/14/14   [provider]    Allergies as of 08/09/2016 - Review Complete 02/26/2015  Allergen Reaction Noted  . Ace inhibitors Other (See Comments) 07/28/2014  . Pravastatin Other (See Comments) 07/28/2014  . Rosuvastatin Other (See Comments) 07/28/2014  . Simvastatin Other (See Comments) 07/28/2014  . Amoxicillin Rash 07/28/2014  . Niacin Rash 07/28/2014  . Penicillins Rash 07/28/2014    Family History  Problem Relation Age of Onset  . Bladder Cancer Neg Hx   . Prostate cancer Neg Hx   . Kidney cancer Neg Hx     Social History   Social History  . Marital status: Legally  Separated    Spouse name: N/A  . Number of children: N/A  . Years of education: N/A   Occupational History  . Not on file.   Social History Main Topics  . Smoking status: Current Some Day Smoker    Packs/day: 0.25    Years: 20.00    Types: Cigarettes  . Smokeless tobacco: Never Used  . Alcohol use 0.0 oz/week     Comment: social on weekends  . Drug use: No  . Sexual activity: Not on file   Other Topics Concern  . Not on file   Social History Narrative  . No narrative on file    Review of Systems: See HPI, otherwise negative ROS  Physical Exam: BP 125/86   Pulse 79   Temp (!) 96.8 F (36 C) (Tympanic)   Ht 6' (1.829 m)   Wt 96.2 kg (212 lb)   SpO2 100%  BMI 28.75 kg/m  General:   Alert,  pleasant and cooperative in NAD Head:  Normocephalic and atraumatic. Neck:  Supple; no masses or thyromegaly. Lungs:  Clear throughout to auscultation.    Heart:  Regular rate and rhythm. Abdomen:  Soft, nontender and nondistended. Normal bowel sounds, without guarding, and without rebound.   Neurologic:  Alert and  oriented x4;  grossly normal neurologically.  Impression/Plan: [name] is here for an endoscopy to be performed for dysphagia and Barretts esophagus.  Risks, benefits, limitations, and alternatives regarding  endoscopy have been reviewed with the patient.  Questions have been answered.  All parties agreeable.   Gaylyn Cheers, MD  10/28/2016, 10:48 AM

## 2016-10-28 NOTE — Anesthesia Preprocedure Evaluation (Addendum)
Anesthesia Evaluation  Patient identified by MRN, date of birth, ID band Patient awake    Reviewed: Allergy & Precautions, NPO status , Patient's Chart, lab work & pertinent test results  History of Anesthesia Complications Negative for: history of anesthetic complications  Airway Mallampati: II  TM Distance: >3 FB Neck ROM: Full    Dental  (+) Poor Dentition, Missing   Pulmonary sleep apnea , neg COPD, Current Smoker,    breath sounds clear to auscultation- rhonchi (-) wheezing      Cardiovascular hypertension, + CAD, + Past MI, + Cardiac Stents, + CABG and +CHF   Rhythm:Regular Rate:Normal - Systolic murmurs and - Diastolic murmurs NM stress test 10/03/16: Treadmill EKG with atrial fibrillation with good heart rate control and no  chest pain Fixed large inferior and lateral myocardial perfusion defect consistent  with previous infarct and/or scar without evidence of myocardial ischemia  Echo 10/02/16: MODERATE LV SYSTOLIC DYSFUNCTION  WITH MILD LVH NORMAL RIGHT VENTRICULAR SYSTOLIC FUNCTION MILD VALVULAR REGURGITATION  NO VALVULAR STENOSIS MILD to MODERATE MR MILD TR, AR, PR EF 35%   Neuro/Psych negative neurological ROS  negative psych ROS   GI/Hepatic Neg liver ROS, hiatal hernia, GERD  ,  Endo/Other  diabetes  Renal/GU negative Renal ROS     Musculoskeletal  (+) Arthritis ,   Abdominal (+) - obese,   Peds  Hematology negative hematology ROS (+)   Anesthesia Other Findings Past Medical History: 04/05/2014: AAA (abdominal aortic aneurysm) without rupture ([place]) 04/05/2014: AAA (abdominal aortic aneurysm) without rupture ([facility]) No date: Abdominal aortic aneurysm without rupture ([place]) 08/15/2014: Acute non-ST elevation myocardial infarction (NSTEMI) ([place]) 07/28/2014: Antepartum deep phlebothrombosis No date: APS (antiphospholipid syndrome) ([place]) 04/05/2014: Arteriosclerosis of coronary artery     Comment:   Overview:  Sp cabg with lima to lad svg to d1 om1 rca               2004 with occluded svg to rca and d1 and pci stetn of om1              graft 2015  No date: Arthritis No date: Barrett's esophagus 05/02/2014: Benign essential HTN No date: BPH (benign prostatic hyperplasia) No date: CAD (coronary artery disease) No date: CHF (congestive heart failure) ([place]) 08/16/1749: Chronic systolic heart failure ([place])     Comment:  Overview:  With segmental LV systolic dysfunction               ejection fraction of 35%  No date: Diabetes ([place]) No date: DVT (deep venous thrombosis) (HCC) No date: Dysrhythmia     Comment:  ATRIAL FIB. No date: GERD (gastroesophageal reflux disease) No date: History of hiatal hernia 2013: History of shingles No date: Hyperlipemia No date: Hyperplastic polyps of stomach No date: Hypertension 07/30/2014: Myocardial infarction ([place]) No date: NSTEMI (non-ST elevated myocardial infarction) ([place]) No date: OSA (obstructive sleep apnea) No date: Osteoarthritis No date: PVD (peripheral vascular disease) (HCC) No date: RA (rheumatoid arthritis) (HCC) No date: SOB (shortness of breath)   Reproductive/Obstetrics                            Anesthesia Physical Anesthesia Plan  ASA: III  Anesthesia Plan: General   Post-op Pain Management:    Induction: Intravenous  PONV Risk Score and Plan: 0 and Propofol infusion  Airway Management Planned: Natural Airway  Additional Equipment:   Intra-op Plan:   Post-operative Plan:   Informed Consent: I  have reviewed the patients History and Physical, chart, labs and discussed the procedure including the risks, benefits and alternatives for the proposed anesthesia with the patient or authorized representative who has indicated his/her understanding and acceptance.   Dental advisory given  Plan Discussed with: CRNA and Anesthesiologist  Anesthesia Plan Comments:         Anesthesia Quick  Evaluation

## 2016-10-28 NOTE — Transfer of Care (Signed)
Immediate Anesthesia Transfer of Care Note  Patient: Martin Adkins  Procedure(s) Performed: ESOPHAGOGASTRODUODENOSCOPY (EGD) WITH PROPOFOL (N/A )  Patient Location: PACU  Anesthesia Type:General  Level of Consciousness: awake  Airway & Oxygen Therapy: Patient Spontanous Breathing  Post-op Assessment: Report given to RN and Post -op Vital signs reviewed and stable  Post vital signs: Reviewed and stable  Last Vitals:  Vitals:   10/28/16 0955 10/28/16 1126  BP: 125/86 93/75  Pulse: 79 82  Resp:  17  Temp: (!) 36 C (!) 36.2 C  SpO2: 100% 100%    Last Pain:  Vitals:   10/28/16 1126  TempSrc: Tympanic         Complications: No apparent anesthesia complications

## 2016-10-28 NOTE — Anesthesia Postprocedure Evaluation (Signed)
Anesthesia Post Note  Patient: Martin Adkins  Procedure(s) Performed: ESOPHAGOGASTRODUODENOSCOPY (EGD) WITH PROPOFOL (N/A )  Patient location during evaluation: Endoscopy Anesthesia Type: General Level of consciousness: awake and alert and oriented Pain management: pain level controlled Vital Signs Assessment: post-procedure vital signs reviewed and stable Respiratory status: spontaneous breathing, nonlabored ventilation and respiratory function stable Cardiovascular status: blood pressure returned to baseline and stable Postop Assessment: no signs of nausea or vomiting Anesthetic complications: no     Last Vitals:  Vitals:   10/28/16 1126 10/28/16 1136  BP: 93/75 110/78  Pulse: 82 64  Resp: 17 19  Temp: (!) 36.2 C   SpO2: 100% 100%    Last Pain:  Vitals:   10/28/16 1126  TempSrc: Tympanic                 Saraiya Kozma

## 2016-10-28 NOTE — Op Note (Signed)
[facility] Gastroenterology Patient Name: Martin Adkins Procedure Date: 10/28/2016 10:56 AM MRN: [id number] Account #: [id number] Date of Birth: Jan 03, 1946 Admit Type: Outpatient Age: 71 Room: [facility] ENDO ROOM 3 Gender: Male Note Status: Finalized Procedure:            Upper GI endoscopy Indications:          Follow-up of Barrett's esophagus Providers:            Manya Silvas, MD Referring MD:         [name], MD (Referring MD) Medicines:            Propofol per Anesthesia Complications:        No immediate complications. Procedure:            Pre-Anesthesia Assessment:                       - After reviewing the risks and benefits, the patient                        was deemed in satisfactory condition to undergo the                        procedure.                       After obtaining informed consent, the endoscope was                        passed under direct vision. Throughout the procedure,                        the patient's blood pressure, pulse, and oxygen                        saturations were monitored continuously. The Endoscope                        was introduced through the mouth, and advanced to the                        second part of duodenum. The patient tolerated the                        procedure well. Findings:      Diffuse and scattered mild mucosal changes characterized by punctated       white spots were found in the middle third of the esophagus and in the       lower third of the esophagus. Biopsies were taken with a cold forceps       for histology.      There were esophageal mucosal changes secondary to established       short-segment Barrett's disease present at the gastroesophageal       junction. The maximum longitudinal extent of these mucosal changes was 2       cm in length. Mucosa was biopsied with a cold forceps for histology. One       specimen bottle was sent to pathology.      Localized mild  inflammation characterized by erythema and granularity       was found in the gastric antrum. Biopsies were taken with a cold forceps  for histology.      The examined duodenum was normal. Impression:           - Punctate white spotted mucosa in the esophagus.                        Biopsied.                       - Esophageal mucosal changes secondary to established                        short-segment Barrett's disease. Biopsied.                       - Gastritis. Biopsied.                       - Normal examined duodenum. Recommendation:       - Await pathology results. Manya Silvas, MD 10/28/2016 11:16:27 AM This report has been signed electronically. Number of Addenda: 0 Note Initiated On: 10/28/2016 10:56 AM      [facility]

## 2016-10-28 NOTE — Anesthesia Procedure Notes (Signed)
Date/Time: 10/28/2016 11:00 AM Performed by: Allean Found Pre-anesthesia Checklist: Patient identified, Emergency Drugs available, Suction available, Patient being monitored and Timeout performed Patient Re-evaluated:Patient Re-evaluated prior to induction Oxygen Delivery Method: Nasal cannula Induction Type: IV induction Placement Confirmation: positive ETCO2 Dental Injury: Teeth and Oropharynx as per pre-operative assessment

## 2016-10-28 NOTE — Anesthesia Post-op Follow-up Note (Signed)
Anesthesia QCDR form completed.        

## 2016-10-29 ENCOUNTER — Encounter: Payer: Self-pay | Admitting: Unknown Physician Specialty

## 2016-10-29 LAB — SURGICAL PATHOLOGY

## 2016-11-11 ENCOUNTER — Other Ambulatory Visit: Payer: Self-pay | Admitting: Internal Medicine

## 2016-11-11 DIAGNOSIS — R0602 Shortness of breath: Secondary | ICD-10-CM

## 2016-11-19 ENCOUNTER — Ambulatory Visit
Admission: RE | Admit: 2016-11-19 | Discharge: 2016-11-19 | Disposition: A | Discharge status: Home / Self Care | Payer: Medicare PPO | Source: Ambulatory Visit | Attending: Internal Medicine | Admitting: Internal Medicine

## 2016-11-19 DIAGNOSIS — I712 Thoracic aortic aneurysm, without rupture: Secondary | ICD-10-CM | POA: Insufficient documentation

## 2016-11-19 DIAGNOSIS — I700 Atherosclerosis of aorta: Secondary | ICD-10-CM | POA: Insufficient documentation

## 2016-11-19 DIAGNOSIS — K760 Fatty (change of) liver, not elsewhere classified: Secondary | ICD-10-CM | POA: Diagnosis not present

## 2016-11-19 DIAGNOSIS — J984 Other disorders of lung: Secondary | ICD-10-CM | POA: Insufficient documentation

## 2016-11-19 DIAGNOSIS — R0602 Shortness of breath: Secondary | ICD-10-CM | POA: Diagnosis present

## 2016-11-19 DIAGNOSIS — D3501 Benign neoplasm of right adrenal gland: Secondary | ICD-10-CM | POA: Insufficient documentation

## 2016-11-19 DIAGNOSIS — Z951 Presence of aortocoronary bypass graft: Secondary | ICD-10-CM | POA: Insufficient documentation

## 2016-11-19 LAB — POCT I-STAT CREATININE: Creatinine, Ser: 1 mg/dL (ref 0.61–1.24)

## 2016-11-19 MED ORDER — IOPAMIDOL (ISOVUE-300) INJECTION 61%
75.0000 mL | Freq: Once | INTRAVENOUS | Status: AC | PRN
  Administered 2016-11-19: 75 mL via INTRAVENOUS

## 2017-02-12 ENCOUNTER — Other Ambulatory Visit (INDEPENDENT_AMBULATORY_CARE_PROVIDER_SITE_OTHER): Payer: Self-pay | Admitting: Vascular Surgery

## 2017-02-12 DIAGNOSIS — Z95828 Presence of other vascular implants and grafts: Secondary | ICD-10-CM

## 2017-02-14 ENCOUNTER — Ambulatory Visit (INDEPENDENT_AMBULATORY_CARE_PROVIDER_SITE_OTHER): Payer: Medicare PPO | Admitting: Vascular Surgery

## 2017-02-14 ENCOUNTER — Other Ambulatory Visit (INDEPENDENT_AMBULATORY_CARE_PROVIDER_SITE_OTHER): Payer: Medicare PPO

## 2017-02-18 ENCOUNTER — Ambulatory Visit (INDEPENDENT_AMBULATORY_CARE_PROVIDER_SITE_OTHER): Payer: Medicare PPO

## 2017-02-18 ENCOUNTER — Ambulatory Visit (INDEPENDENT_AMBULATORY_CARE_PROVIDER_SITE_OTHER): Payer: Medicare PPO | Admitting: Vascular Surgery

## 2017-02-18 ENCOUNTER — Encounter (INDEPENDENT_AMBULATORY_CARE_PROVIDER_SITE_OTHER): Payer: Self-pay | Admitting: Vascular Surgery

## 2017-02-18 VITALS — BP 137/88 | HR 70 | Resp 16 | Wt 212.0 lb

## 2017-02-18 DIAGNOSIS — I714 Abdominal aortic aneurysm, without rupture, unspecified: Secondary | ICD-10-CM

## 2017-02-18 DIAGNOSIS — I10 Essential (primary) hypertension: Secondary | ICD-10-CM

## 2017-02-18 DIAGNOSIS — Z95828 Presence of other vascular implants and grafts: Secondary | ICD-10-CM | POA: Diagnosis not present

## 2017-02-18 DIAGNOSIS — I739 Peripheral vascular disease, unspecified: Secondary | ICD-10-CM | POA: Diagnosis not present

## 2017-02-18 NOTE — Assessment & Plan Note (Signed)
Aortic duplex today does not show an obvious endoleak, but there has been continued mild growth of his abdominal aortic aneurysm.  It is now measuring about 4.6-4.7 cm in maximal diameter.  Although this remains below a 5 cm threshold, this continued growth is something that will have to be monitored.  I will keep a short interval follow-up of 6 months.  Should he have continued growth, we may have to get a CT scan with the next visit.

## 2017-02-18 NOTE — Assessment & Plan Note (Signed)
blood pressure control important in reducing the progression of atherosclerotic disease. On appropriate oral medications.  

## 2017-02-18 NOTE — Assessment & Plan Note (Signed)
Perfusion has not been checked in some time and he complains of leg pain.  Check ABIs at next visit.

## 2017-02-18 NOTE — Progress Notes (Signed)
MRN : [id number]  Martin Adkins is a 72 y.o. (06/14/1945) male who presents with chief complaint of  Chief Complaint  Patient presents with  . Follow-up    66month EVAR  .  History of Present Illness: Patient returns today in follow up of his abdominal aortic aneurysm.  He is 5-6 years status post endovascular repair.  He does not have any aneurysm related symptoms.  He does complain of leg pain particularly with activity.  No fevers or chills. Aortic duplex today does not show an obvious endoleak, but there has been continued mild growth of his abdominal aortic aneurysm.  It is now measuring about 4.6-4.7 cm in maximal diameter.  Current Outpatient Medications  Medication Sig Dispense Refill  . albuterol (PROVENTIL HFA;VENTOLIN HFA) 108 (90 Base) MCG/ACT inhaler Inhale 2 puffs into the lungs every 6 (six) hours as needed for wheezing or shortness of breath. 1 Inhaler 0  . amLODipine (NORVASC) 10 MG tablet Take 10 mg by mouth daily.     Marland Kitchen atorvastatin (LIPITOR) 80 MG tablet TAKE 1 TABLET (80 MG TOTAL) BY MOUTH ONCE DAILY.  11  . fluticasone (FLONASE) 50 MCG/ACT nasal spray Place 2 sprays into both nostrils daily.     . furosemide (LASIX) 20 MG tablet Take 20 mg by mouth daily.  11  . gabapentin (NEURONTIN) 300 MG capsule Take 300 mg by mouth 2 (two) times daily.    . hydroxychloroquine (PLAQUENIL) 200 MG tablet Take 1 tablet by mouth daily.    . isosorbide mononitrate (IMDUR) 30 MG 24 hr tablet Take 30 mg by mouth daily.     Marland Kitchen losartan (COZAAR) 100 MG tablet     . metoprolol succinate (TOPROL-XL) 100 MG 24 hr tablet Take 100 mg by mouth daily.     . nitroGLYCERIN (NITROSTAT) 0.4 MG SL tablet Place 1 tablet (0.4 mg total) under the tongue every 5 (five) minutes as needed for Chest pain. May take up to 3 doses.    . potassium chloride SA (K-DUR,KLOR-CON) 20 MEQ tablet Take 20 mEq by mouth daily.     Marland Kitchen warfarin (COUMADIN) 1 MG tablet Take by mouth daily. Patient takes once a week with  5mg  tablet for a total of 7.5mg .    . warfarin (COUMADIN) 5 MG tablet TAKE 1 TABLET (5 MG TOTAL) BY MOUTH ONCE DAILY.  5  . aspirin EC 81 MG tablet Take 81 mg by mouth daily.    . chlorthalidone (HYGROTON) 25 MG tablet Take 25 mg by mouth daily.     . clopidogrel (PLAVIX) 75 MG tablet Reported on 01/23/2015  11  . docusate sodium (COLACE) 100 MG capsule Take 1 capsule (100 mg total) by mouth 2 (two) times daily. (Patient not taking: Reported on 08/13/2016) 60 capsule 0  . LUBRICANT EYE DROPS 0.4-0.3 % SOLN Apply 1 drop to eye daily.     . mirabegron ER (MYRBETRIQ) 25 MG TB24 tablet Take 1 tablet (25 mg total) by mouth daily. (Patient not taking: Reported on 01/23/2015) 30 tablet 0  . Neomycin-Bacitracin-Polymyxin (HCA TRIPLE ANTIBIOTIC OINTMENT EX) Reported on 02/20/2015    . oxyCODONE-acetaminophen (PERCOCET) 5-325 MG tablet Take 1-2 tablets by mouth every 4 (four) hours as needed for moderate pain or severe pain. (Patient not taking: Reported on 02/18/2017) 20 tablet 0  . pantoprazole (PROTONIX) 40 MG tablet Take 40 mg by mouth daily.     . valsartan (DIOVAN) 160 MG tablet Take 160 mg by mouth 2 (two) times  daily.      No current facility-administered medications for this visit.     Past Medical History:  Diagnosis Date  . AAA (abdominal aortic aneurysm) without rupture ([place]) 04/05/2014  . AAA (abdominal aortic aneurysm) without rupture ([place]) 04/05/2014  . Abdominal aortic aneurysm without rupture ([place])   . Acute non-ST elevation myocardial infarction (NSTEMI) ([place]) 08/15/2014  . 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/28/2014  . APS (antiphospholipid syndrome) ([place])   . Arteriosclerosis of coronary artery 04/05/2014   Overview:  Sp cabg with lima to lad svg to d1 om1 rca 2004 with occluded svg to rca and d1 and pci stetn of om1 graft 2015   . Arthritis   . Barrett's esophagus   . Benign essential HTN 05/02/2014  . BPH (benign prostatic hyperplasia)   . CAD (coronary artery disease)   . CHF  (congestive heart failure) ([place])   . Chronic systolic heart failure ([place]) 04/19/2014   Overview:  With segmental LV systolic dysfunction ejection fraction of 35%   . Diabetes ([place])   . DVT (deep venous thrombosis) ([place])   . Dysrhythmia    ATRIAL FIB.  Marland Kitchen GERD (gastroesophageal reflux disease)   . History of hiatal hernia   . History of shingles 2013  . Hyperlipemia   . Hyperplastic polyps of stomach   . Hypertension   . Myocardial infarction ([place]) 07/30/2014  . NSTEMI (non-ST elevated myocardial infarction) ([place])   . OSA (obstructive sleep apnea)   . Osteoarthritis   . PVD (peripheral vascular disease) ([place])   . RA (rheumatoid arthritis) ([place])   . SOB (shortness of breath)     Past Surgical History:  Procedure Laterality Date  . ABDOMINAL AORTA STENT    . CHOLECYSTECTOMY    . COLONOSCOPY  11/24/2009  . CORONARY ANGIOPLASTY WITH STENT PLACEMENT  2015  . CORONARY ARTERY BYPASS GRAFT    . ESOPHAGOGASTRODUODENOSCOPY  05/26/02  03/23/12  . ESOPHAGOGASTRODUODENOSCOPY (EGD) WITH PROPOFOL N/A 10/28/2016   Procedure: ESOPHAGOGASTRODUODENOSCOPY (EGD) WITH PROPOFOL;  Surgeon: Manya Silvas, MD;  Location[facility] ENDOSCOPY;  Service: Endoscopy;  Laterality: N/A;  . PERIPHERAL VASCULAR CATHETERIZATION N/A 09/06/2014   Procedure: IVC Filter Removal;  Surgeon: Katha Cabal, MD;  Location: [facility] CV LAB;  Service: Cardiovascular;  Laterality: N/A;  . TRANSURETHRAL RESECTION OF PROSTATE N/A 02/20/2015   Procedure: TRANSURETHRAL RESECTION OF THE PROSTATE (TURP);  Surgeon: Hollice Espy, MD;  Location: [facility] ORS;  Service: Urology;  Laterality: N/A;    Social History Social History   Tobacco Use  . Smoking status: Current Some Day Smoker    Packs/day: 0.25    Years: 20.00    Pack years: 5.00    Types: Cigarettes  . Smokeless tobacco: Never Used  Substance Use Topics  . Alcohol use: Yes    Alcohol/week: 0.0 oz    Comment: social on weekends  . Drug use: No      Family  History Family History  Problem Relation Age of Onset  . Bladder Cancer Neg Hx   . Prostate cancer Neg Hx   . Kidney cancer Neg Hx      Allergies  Allergen Reactions  . Ace Inhibitors Other (See Comments)    Other reaction(s): Cough  . Pravastatin Other (See Comments)    Other reaction(s): Muscle Pain  . Rosuvastatin Other (See Comments)    Other reaction(s): Other (See Comments) GI bleed  . Simvastatin Other (See Comments)    Other reaction(s): Muscle Pain  . Amoxicillin Rash  .  Niacin Rash  . Penicillins Rash    Has patient had a PCN reaction causing immediate rash, facial/tongue/throat swelling, SOB or lightheadedness with hypotension: Yes Has patient had a PCN reaction causing severe rash involving mucus membranes or skin necrosis: Unknown Has patient had a PCN reaction that required hospitalization: Unknown Has patient had a PCN reaction occurring within the last 10 years: No If all of the above answers are "NO", then may proceed with Cephalosporin use.     REVIEW OF SYSTEMS (Negative unless checked)  Constitutional: [] Weight loss  [] Fever  [] Chills Cardiac: [] Chest pain   [] Chest pressure   [x] Palpitations   [] Shortness of breath when laying flat   [] Shortness of breath at rest   [x] Shortness of breath with exertion. Vascular:  [x] Pain in legs with walking   [] Pain in legs at rest   [] Pain in legs when laying flat   [] Claudication   [] Pain in feet when walking  [] Pain in feet at rest  [] Pain in feet when laying flat   [] History of DVT   [] Phlebitis   [] Swelling in legs   [] Varicose veins   [] Non-healing ulcers Pulmonary:   [] Uses home oxygen   [] Productive cough   [] Hemoptysis   [] Wheeze  [] COPD   [] Asthma Neurologic:  [] Dizziness  [] Blackouts   [] Seizures   [] History of stroke   [] History of TIA  [] Aphasia   [] Temporary blindness   [] Dysphagia   [] Weakness or numbness in arms   [] Weakness or numbness in legs Musculoskeletal:  [x] Arthritis   [] Joint swelling   [x] Joint  pain   [] Low back pain Hematologic:  [] Easy bruising  [] Easy bleeding   [] Hypercoagulable state   [] Anemic   Gastrointestinal:  [] Blood in stool   [] Vomiting blood  [x] Gastroesophageal reflux/heartburn   [] Abdominal pain Genitourinary:  [x] Chronic kidney disease   [] Difficult urination  [] Frequent urination  [] Burning with urination   [] Hematuria Skin:  [] Rashes   [] Ulcers   [] Wounds Psychological:  [] History of anxiety   []  History of major depression.  Physical Examination  BP 137/88 (BP Location: Right Arm)   Pulse 70   Resp 16   Wt 96.2 kg (212 lb)   BMI 28.75 kg/m  Gen:  WD/WN, NAD Head: [place]/AT, No temporalis wasting. Ear/Nose/Throat: Hearing grossly intact, nares w/o erythema or drainage, trachea midline Eyes: Conjunctiva clear. Sclera non-icteric Neck: Supple.  No JVD.  Pulmonary:  Good air movement, no use of accessory muscles.  Cardiac: irregular Vascular:  Vessel Right Left                              PT 1+ Palpable Not Palpable  DP 1+ Palpable 1+ Palpable   Gastrointestinal: soft, non-tender/non-distended.  Musculoskeletal: M/S 5/5 throughout.  No deformity or atrophy. Mild LE edema. Neurologic: Sensation grossly intact in extremities.  Symmetrical.  Speech is fluent.  Psychiatric: Judgment intact, Mood & affect appropriate for pt's clinical situation. Dermatologic: No rashes or ulcers noted.  No cellulitis or open wounds.       Labs No results found for this or any previous visit (from the past 2160 hour(s)).  Radiology No results found.    Assessment/Plan  Benign essential HTN blood pressure control important in reducing the progression of atherosclerotic disease. On appropriate oral medications.   Peripheral vascular disease ([place]) Perfusion has not been checked in some time and he complains of leg pain.  Check ABIs at next visit.  AAA (abdominal aortic aneurysm)  without rupture Aortic duplex today does not show an obvious endoleak, but  there has been continued mild growth of his abdominal aortic aneurysm.  It is now measuring about 4.6-4.7 cm in maximal diameter.  Although this remains below a 5 cm threshold, this continued growth is something that will have to be monitored.  I will keep a short interval follow-up of 6 months.  Should he have continued growth, we may have to get a CT scan with the next visit.    Leotis Pain, MD  02/18/2017 12:57 PM    This note was created with Dragon medical transcription system.  Any errors from dictation are purely unintentional

## 2017-02-18 NOTE — Patient Instructions (Signed)
Abdominal Aortic Aneurysm Blood pumps away from the heart through tubes (blood vessels) called arteries. Aneurysms are weak or damaged places in the wall of an artery. It bulges out like a balloon. An abdominal aortic aneurysm happens in the main artery of the body (aorta). It can burst or tear, causing bleeding inside the body. This is an emergency. It needs treatment right away. What are the causes? The exact cause is unknown. Things that could cause this problem include:  Fat and other substances building up in the lining of a tube.  Swelling of the walls of a blood vessel.  Certain tissue diseases.  Belly (abdominal) trauma.  An infection in the main artery of the body.  What increases the risk? There are things that make it more likely for you to have an aneurysm. These include:  Being over the age of 70 years old.  Having high blood pressure (hypertension).  Being a male.  Being white.  Being very overweight (obese).  Having a family history of aneurysm.  Using tobacco products.  What are the signs or symptoms? Symptoms depend on the size of the aneurysm and how fast it grows. There may not be symptoms. If symptoms occur, they can include:  Pain (belly, side, lower back, or groin).  Feeling full after eating a small amount of food.  Feeling sick to your stomach (nauseous), throwing up (vomiting), or both.  Feeling a lump in your belly that feels like it is beating (pulsating).  Feeling like you will pass out (faint).  How is this treated?  Medicine to control blood pressure and pain.  Imaging tests to see if the aneurysm gets bigger.  Surgery. How is this prevented? To lessen your chance of getting this condition:  Stop smoking. Stop chewing tobacco.  Limit or avoid alcohol.  Keep your blood pressure, blood sugar, and cholesterol within normal limits.  Eat less salt.  Eat foods low in saturated fats and cholesterol. These are found in animal and  whole dairy products.  Eat more fiber. Fiber is found in whole grains, vegetables, and fruits.  Keep a healthy weight.  Stay active and exercise often.  This information is not intended to replace advice given to you by your health care provider. Make sure you discuss any questions you have with your health care provider. Document Released: 04/20/2012 Document Revised: 06/01/2015 Document Reviewed: 01/24/2012 Elsevier Interactive Patient Education  2017 [employer]  

## 2017-02-27 ENCOUNTER — Other Ambulatory Visit (INDEPENDENT_AMBULATORY_CARE_PROVIDER_SITE_OTHER): Payer: Medicare PPO

## 2017-03-21 ENCOUNTER — Inpatient Hospital Stay: Payer: Medicare PPO | Admitting: *Deleted

## 2017-03-21 ENCOUNTER — Encounter: Payer: Self-pay | Admitting: Oncology

## 2017-03-21 ENCOUNTER — Inpatient Hospital Stay: Payer: Medicare PPO | Attending: Hematology and Oncology | Admitting: Oncology

## 2017-03-21 ENCOUNTER — Other Ambulatory Visit: Payer: Self-pay

## 2017-03-21 VITALS — BP 136/91 | HR 86 | Temp 97.1°F | Resp 18 | Ht 73.23 in | Wt 212.8 lb

## 2017-03-21 DIAGNOSIS — D6861 Antiphospholipid syndrome: Secondary | ICD-10-CM | POA: Diagnosis not present

## 2017-03-21 DIAGNOSIS — I10 Essential (primary) hypertension: Secondary | ICD-10-CM | POA: Diagnosis not present

## 2017-03-21 DIAGNOSIS — Z79899 Other long term (current) drug therapy: Secondary | ICD-10-CM

## 2017-03-21 DIAGNOSIS — M129 Arthropathy, unspecified: Secondary | ICD-10-CM

## 2017-03-21 DIAGNOSIS — Z86718 Personal history of other venous thrombosis and embolism: Secondary | ICD-10-CM | POA: Diagnosis not present

## 2017-03-21 DIAGNOSIS — F17210 Nicotine dependence, cigarettes, uncomplicated: Secondary | ICD-10-CM

## 2017-03-21 DIAGNOSIS — K760 Fatty (change of) liver, not elsewhere classified: Secondary | ICD-10-CM

## 2017-03-21 DIAGNOSIS — I5022 Chronic systolic (congestive) heart failure: Secondary | ICD-10-CM | POA: Diagnosis not present

## 2017-03-21 DIAGNOSIS — I2510 Atherosclerotic heart disease of native coronary artery without angina pectoris: Secondary | ICD-10-CM

## 2017-03-21 DIAGNOSIS — R531 Weakness: Secondary | ICD-10-CM | POA: Diagnosis not present

## 2017-03-21 DIAGNOSIS — E538 Deficiency of other specified B group vitamins: Secondary | ICD-10-CM | POA: Diagnosis not present

## 2017-03-21 DIAGNOSIS — K219 Gastro-esophageal reflux disease without esophagitis: Secondary | ICD-10-CM

## 2017-03-21 DIAGNOSIS — D696 Thrombocytopenia, unspecified: Secondary | ICD-10-CM | POA: Diagnosis not present

## 2017-03-21 DIAGNOSIS — I252 Old myocardial infarction: Secondary | ICD-10-CM

## 2017-03-21 DIAGNOSIS — M069 Rheumatoid arthritis, unspecified: Secondary | ICD-10-CM

## 2017-03-21 DIAGNOSIS — D72820 Lymphocytosis (symptomatic): Secondary | ICD-10-CM

## 2017-03-21 DIAGNOSIS — I4891 Unspecified atrial fibrillation: Secondary | ICD-10-CM | POA: Diagnosis not present

## 2017-03-21 DIAGNOSIS — Z8042 Family history of malignant neoplasm of prostate: Secondary | ICD-10-CM

## 2017-03-21 DIAGNOSIS — I714 Abdominal aortic aneurysm, without rupture: Secondary | ICD-10-CM

## 2017-03-21 DIAGNOSIS — Z803 Family history of malignant neoplasm of breast: Secondary | ICD-10-CM

## 2017-03-21 DIAGNOSIS — N400 Enlarged prostate without lower urinary tract symptoms: Secondary | ICD-10-CM

## 2017-03-21 DIAGNOSIS — Z7982 Long term (current) use of aspirin: Secondary | ICD-10-CM

## 2017-03-21 DIAGNOSIS — G4730 Sleep apnea, unspecified: Secondary | ICD-10-CM | POA: Diagnosis not present

## 2017-03-21 DIAGNOSIS — R5383 Other fatigue: Secondary | ICD-10-CM

## 2017-03-21 DIAGNOSIS — E119 Type 2 diabetes mellitus without complications: Secondary | ICD-10-CM | POA: Diagnosis not present

## 2017-03-21 DIAGNOSIS — D3501 Benign neoplasm of right adrenal gland: Secondary | ICD-10-CM

## 2017-03-21 DIAGNOSIS — Z86010 Personal history of colonic polyps: Secondary | ICD-10-CM

## 2017-03-21 HISTORY — DX: Deficiency of other specified B group vitamins: E53.8

## 2017-03-21 LAB — COMPREHENSIVE METABOLIC PANEL
ALK PHOS: 88 U/L (ref 38–126)
ALT: 33 U/L (ref 17–63)
ANION GAP: 8 (ref 5–15)
AST: 44 U/L — ABNORMAL HIGH (ref 15–41)
Albumin: 4.3 g/dL (ref 3.5–5.0)
BUN: 18 mg/dL (ref 6–20)
CALCIUM: 9 mg/dL (ref 8.9–10.3)
CHLORIDE: 104 mmol/L (ref 101–111)
CO2: 24 mmol/L (ref 22–32)
CREATININE: 1 mg/dL (ref 0.61–1.24)
Glucose, Bld: 112 mg/dL — ABNORMAL HIGH (ref 65–99)
Potassium: 4.5 mmol/L (ref 3.5–5.1)
SODIUM: 136 mmol/L (ref 135–145)
Total Bilirubin: 1.5 mg/dL — ABNORMAL HIGH (ref 0.3–1.2)
Total Protein: 8 g/dL (ref 6.5–8.1)

## 2017-03-21 LAB — CBC WITH DIFFERENTIAL/PLATELET
BASOS ABS: 0.4 10*3/uL — AB (ref 0–0.1)
Basophils Relative: 5 %
EOS PCT: 1 %
Eosinophils Absolute: 0.1 10*3/uL (ref 0–0.7)
HCT: 43.8 % (ref 40.0–52.0)
Hemoglobin: 14.9 g/dL (ref 13.0–18.0)
LYMPHS ABS: 6 10*3/uL — AB (ref 1.0–3.6)
LYMPHS PCT: 60 %
MCH: 27.7 pg (ref 26.0–34.0)
MCHC: 33.9 g/dL (ref 32.0–36.0)
MCV: 81.8 fL (ref 80.0–100.0)
MONO ABS: 0.2 10*3/uL (ref 0.2–1.0)
MONOS PCT: 2 %
Neutro Abs: 3.4 10*3/uL (ref 1.4–6.5)
Neutrophils Relative %: 34 %
PLATELETS: 121 10*3/uL — AB (ref 150–440)
RBC: 5.36 MIL/uL (ref 4.40–5.90)
RDW: 17.7 % — AB (ref 11.5–14.5)
WBC: 10 10*3/uL (ref 3.8–10.6)

## 2017-03-21 LAB — FOLATE: Folate: 8.3 ng/mL (ref >5.9)

## 2017-03-21 LAB — LACTATE DEHYDROGENASE: LDH: 228 U/L — AB (ref 98–192)

## 2017-03-21 LAB — TSH: TSH: 1.545 u[IU]/mL (ref 0.350–4.500)

## 2017-03-21 LAB — VITAMIN B12: Vitamin B-12: 174 pg/mL — ABNORMAL LOW (ref 180–914)

## 2017-03-21 NOTE — Progress Notes (Signed)
Hematology/Oncology Consult note [facility] Telephone:([phone] [phone]   Patient Care Team: [name], MD as PCP - General (Internal Medicine)  REFERRING PROVIDER: [name], MD CHIEF COMPLAINTS/PURPOSE OF CONSULTATION:  Evaluation of lymphocytosis  HISTORY OF PRESENTING ILLNESS:  Martin Adkins is a  72 y.o.  male with PMH listed below who was referred to me for evaluation of lymphocytosis.  Recent lab work on 03/05/2017 showed wbc 11.4, hb 14.6, platelet count 117,000, lymphocytosis 63.5%, neutrophil 22%, Report chronic fatigue, no weight loss, fever or chills.  He reports feeling chest "rattleness". Has for CT chest which showed acute finding, chronic finding includes  postsurgical changes consistent with coronary bypass grafting. One of the bypass grafts arising from the aorta shows apparent thrombosis and an area of distal aneurysmal dilatation which is also Thrombosed. Right adrenal adenoma stable in appearance.Mild scarring in the lung bases.Fatty liver.   Takes Aspirin 59m, coumadine, plavix. Denies any bleeding events. Use to drink hard liquir daily, quitted for 4-5 years. Current drinks wine on weekend.    Review of Systems  Constitutional: Positive for malaise/fatigue. Negative for chills, fever and weight loss.  HENT: Negative for nosebleeds.   Eyes: Negative for pain.  Respiratory: Positive for shortness of breath. Negative for cough.   Cardiovascular: Negative for chest pain.  Gastrointestinal: Negative for heartburn and vomiting.  Genitourinary: Negative for dysuria.  Musculoskeletal: Negative for myalgias.  Skin: Negative for rash.  Neurological: Negative for tingling and tremors.  Endo/Heme/Allergies: Does not bruise/bleed easily.    MEDICAL HISTORY:  Past Medical History:  Diagnosis Date  . AAA (abdominal aortic aneurysm) without rupture ([place] 04/05/2014  . AAA (abdominal aortic aneurysm) without  rupture ([place] 04/05/2014  . Abdominal aortic aneurysm without rupture ([place]   . Acute non-ST elevation myocardial infarction (NSTEMI) ([place] 08/15/2014  . 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/28/2014  . APS (antiphospholipid syndrome) ([place]   . Arteriosclerosis of coronary artery 04/05/2014   Overview:  Sp cabg with lima to lad svg to d1 om1 rca 2004 with occluded svg to rca and d1 and pci stetn of om1 graft 2015   . Arthritis   . Barrett's esophagus   . Benign essential HTN 05/02/2014  . BPH (benign prostatic hyperplasia)   . CAD (coronary artery disease)   . CHF (congestive heart failure) ([place]   . Chronic systolic heart failure ([place] 04/19/2014   Overview:  With segmental LV systolic dysfunction ejection fraction of 35%   . Diabetes ([place]   . DVT (deep venous thrombosis) (HW[place]   . Dysrhythmia    ATRIAL FIB.  .Marland KitchenGERD (gastroesophageal reflux disease)   . History of hiatal hernia   . History of shingles 2013  . Hyperlipemia   . Hyperplastic polyps of stomach   . Hypertension   . Myocardial infarction (HD[place] 07/30/2014  . NSTEMI (non-ST elevated myocardial infarction) (HB[place]   . OSA (obstructive sleep apnea)   . Osteoarthritis   . PVD (peripheral vascular disease) ([place]   . RA (rheumatoid arthritis) (HT[place]   . SOB (shortness of breath)     SURGICAL HISTORY: Past Surgical History:  Procedure Laterality Date  . ABDOMINAL AORTA STENT    . CHOLECYSTECTOMY    . COLONOSCOPY  11/24/2009  . CORONARY ANGIOPLASTY WITH STENT PLACEMENT  2015  . CORONARY ARTERY BYPASS GRAFT    . ESOPHAGOGASTRODUODENOSCOPY  05/26/02  03/23/12  . ESOPHAGOGASTRODUODENOSCOPY (EGD) WITH PROPOFOL N/A 10/28/2016   Procedure: ESOPHAGOGASTRODUODENOSCOPY (EGD) WITH PROPOFOL;  Surgeon:  Manya Silvas, MD;  Location[facility] ENDOSCOPY;  Service: Endoscopy;  Laterality: N/A;  . PERIPHERAL VASCULAR CATHETERIZATION N/A 09/06/2014   Procedure: IVC Filter Removal;  Surgeon: Katha Cabal, MD;  Location: [place] CV LAB;   Service: Cardiovascular;  Laterality: N/A;  . TRANSURETHRAL RESECTION OF PROSTATE N/A 02/20/2015   Procedure: TRANSURETHRAL RESECTION OF THE PROSTATE (TURP);  Surgeon: Hollice Espy, MD;  Location: [facility] ORS;  Service: Urology;  Laterality: N/A;    SOCIAL HISTORY: Social History   Socioeconomic History  . Marital status: Legally Separated    Spouse name: Not on file  . Number of children: Not on file  . Years of education: Not on file  . Highest education level: Not on file  Social Needs  . Financial resource strain: Not on file  . Food insecurity - worry: Not on file  . Food insecurity - inability: Not on file  . Transportation needs - medical: Not on file  . Transportation needs - non-medical: Not on file  Occupational History  . Not on file  Tobacco Use  . Smoking status: Current Some Day Smoker    Packs/day: 0.25    Years: 20.00    Pack years: 5.00    Types: Cigarettes  . Smokeless tobacco: Never Used  . Tobacco comment: per pt nicotine patch number47m -smokes 1-3 cig/per d now  Substance and Sexual Activity  . Alcohol use: Yes    Alcohol/week: 0.0 oz    Comment: social on weekends  . Drug use: No  . Sexual activity: No  Other Topics Concern  . Not on file  Social History Narrative  . Not on file    FAMILY HISTORY: Family History  Problem Relation Age of Onset  . Cancer Mother 732      breast ca  . Diabetes Mother   . Coronary artery disease Father   . Heart attack Father   . Prostate cancer Brother   . Bladder Cancer Neg Hx   . Kidney cancer Neg Hx     ALLERGIES:  is allergic to ace inhibitors; pravastatin; rosuvastatin; simvastatin; amoxicillin; niacin; and penicillins.  MEDICATIONS:  Current Outpatient Medications  Medication Sig Dispense Refill  . amLODipine (NORVASC) 10 MG tablet Take 10 mg by mouth daily.     .Marland Kitchenaspirin EC 81 MG tablet Take 81 mg by mouth daily.    .Marland Kitchenatorvastatin (LIPITOR) 80 MG tablet TAKE 1 TABLET (80 MG TOTAL) BY MOUTH ONCE  DAILY.  11  . chlorthalidone (HYGROTON) 25 MG tablet Take 25 mg by mouth daily.     . clopidogrel (PLAVIX) 75 MG tablet Reported on 01/23/2015  11  . fluticasone (FLONASE) 50 MCG/ACT nasal spray Place 2 sprays into both nostrils daily.     . furosemide (LASIX) 20 MG tablet Take 20 mg by mouth daily.  11  . gabapentin (NEURONTIN) 300 MG capsule Take 300 mg by mouth 2 (two) times daily.    . hydroxychloroquine (PLAQUENIL) 200 MG tablet Take 1 tablet by mouth daily.    . isosorbide mononitrate (IMDUR) 30 MG 24 hr tablet Take 30 mg by mouth daily.     .Marland Kitchenlosartan (COZAAR) 100 MG tablet 100 mg daily.     .Phillis KnackEYE DROPS 0.4-0.3 % SOLN Apply 1 drop to eye daily.     . metoprolol succinate (TOPROL-XL) 100 MG 24 hr tablet Take 100 mg by mouth daily.     . pantoprazole (PROTONIX) 40 MG tablet Take 40 mg  by mouth daily.     . potassium chloride SA (K-DUR,KLOR-CON) 20 MEQ tablet Take 20 mEq by mouth daily.     . valsartan (DIOVAN) 160 MG tablet Take 160 mg by mouth 2 (two) times daily.     Marland Kitchen warfarin (COUMADIN) 1 MG tablet Take by mouth daily. Patient takes once a week with 65m tablet for a total of 6 mg per day    . warfarin (COUMADIN) 5 MG tablet TAKE 1 TABLET (5 MG TOTAL) BY MOUTH ONCE DAILY.  5  . albuterol (PROVENTIL HFA;VENTOLIN HFA) 108 (90 Base) MCG/ACT inhaler Inhale 2 puffs into the lungs every 6 (six) hours as needed for wheezing or shortness of breath. (Patient not taking: Reported on 03/21/2017) 1 Inhaler 0  . docusate sodium (COLACE) 100 MG capsule Take 1 capsule (100 mg total) by mouth 2 (two) times daily. (Patient not taking: Reported on 08/13/2016) 60 capsule 0  . Neomycin-Bacitracin-Polymyxin (HCA TRIPLE ANTIBIOTIC OINTMENT EX) Reported on 02/20/2015    . nitroGLYCERIN (NITROSTAT) 0.4 MG SL tablet Place 1 tablet (0.4 mg total) under the tongue every 5 (five) minutes as needed for Chest pain. May take up to 3 doses.     No current facility-administered medications for this visit.       PHYSICAL EXAMINATION: ECOG PERFORMANCE STATUS: 1 - Symptomatic but completely ambulatory Vitals:   03/21/17 1148  BP: (!) 136/91  Pulse: 86  Resp: 18  Temp: (!) 97.1 F (36.2 C)   Filed Weights   03/21/17 1148  Weight: 212 lb 12.8 oz (96.5 kg)    Physical Exam  Constitutional: He is oriented to person, place, and time and well-developed, well-nourished, and in no distress. No distress.  HENT:  Head: Normocephalic.  Mouth/Throat: No oropharyngeal exudate.  Eyes: EOM are normal. Pupils are equal, round, and reactive to light. No scleral icterus.  Neck: Normal range of motion. Neck supple.  Cardiovascular: Normal rate.  Pulmonary/Chest: Effort normal and breath sounds normal.  Abdominal: Bowel sounds are normal.  Musculoskeletal: He exhibits no edema.  Neurological: He is alert and oriented to person, place, and time. Coordination normal.  Skin: Skin is warm and dry.  Psychiatric: Affect and judgment normal.     LABORATORY DATA:  I have reviewed the data as listed Lab Results  Component Value Date   WBC 10.0 03/21/2017   HGB 14.9 03/21/2017   HCT 43.8 03/21/2017   MCV 81.8 03/21/2017   PLT 121 (L) 03/21/2017   Recent Labs    08/13/16 0325 11/19/16 1126 03/21/17 1243  NA 135  --  136  K 3.8  --  4.5  CL 101  --  104  CO2 22  --  24  GLUCOSE 127*  --  112*  BUN 34*  --  18  CREATININE 1.99* 1.00 1.00  CALCIUM 9.0  --  9.0  GFRNONAA 33*  --  >60  GFRAA 38*  --  >60  PROT  --   --  8.0  ALBUMIN  --   --  4.3  AST  --   --  44*  ALT  --   --  33  ALKPHOS  --   --  88  BILITOT  --   --  1.5*       ASSESSMENT & PLAN:  1. Lymphocytosis   2. Thrombocytopenia ([place]   3. B12 deficiency    For the work up of patient's lymphocytosis, and thrombocytopenia, I recommend checking CBC;CMP, LDH; pathology smear  review, folate, Vitamin B12, hepatitis, HIV, ANA,  and monoclonal gammopathy workup. Will also check ultrasound of the abdomen. Also, discussed with  the patient that if no clear etiology found- bone marrow biopsy would be suggested. Currently await for the above workup.   # Patient follow-up with me in approximately 2 weeks to review the above results. # Advise achol cessation.  # Lab showed B12 deficiency. Will schedule him to start with Daily Vitamin b12 IMx 5 followed by weekly vitamin B12 for 4 weeks.   All questions were answered. The patient knows to call the clinic with any problems questions or concerns.  Return of visit: 2 weeks.  Thank you for this kind referral and the opportunity to participate in the care of this patient. A copy of today's note is routed to referring provider    Earlie Server, MD, PhD Hematology Oncology [facility] at [facility] Pager- [phone] 03/21/2017

## 2017-03-21 NOTE — Progress Notes (Signed)
Here for new pt evaluation.  

## 2017-03-22 LAB — HEPATITIS PANEL, ACUTE
HEP B S AG: NEGATIVE
Hep A IgM: NEGATIVE
Hep B C IgM: NEGATIVE

## 2017-03-24 ENCOUNTER — Ambulatory Visit: Payer: Medicare PPO | Admitting: Hematology and Oncology

## 2017-03-24 LAB — PROTEIN ELECTROPHORESIS, SERUM
A/G Ratio: 1.2 (ref 0.7–1.7)
ALBUMIN ELP: 3.8 g/dL (ref 2.9–4.4)
ALPHA-1-GLOBULIN: 0.3 g/dL (ref 0.0–0.4)
Alpha-2-Globulin: 0.8 g/dL (ref 0.4–1.0)
Beta Globulin: 1 g/dL (ref 0.7–1.3)
GAMMA GLOBULIN: 1.2 g/dL (ref 0.4–1.8)
GLOBULIN, TOTAL: 3.2 g/dL (ref 2.2–3.9)
Total Protein ELP: 7 g/dL (ref 6.0–8.5)

## 2017-03-24 LAB — COMP PANEL: LEUKEMIA/LYMPHOMA: Immunophenotypic Profile: 59

## 2017-03-25 ENCOUNTER — Inpatient Hospital Stay: Payer: Medicare PPO

## 2017-03-25 DIAGNOSIS — E538 Deficiency of other specified B group vitamins: Secondary | ICD-10-CM

## 2017-03-25 DIAGNOSIS — D72820 Lymphocytosis (symptomatic): Secondary | ICD-10-CM | POA: Diagnosis not present

## 2017-03-25 MED ORDER — CYANOCOBALAMIN 1000 MCG/ML IJ SOLN
1000.0000 ug | Freq: Once | INTRAMUSCULAR | Status: AC
  Administered 2017-03-25: 1000 ug via INTRAMUSCULAR
  Filled 2017-03-25: qty 1

## 2017-03-26 ENCOUNTER — Inpatient Hospital Stay: Payer: Medicare PPO

## 2017-03-26 ENCOUNTER — Ambulatory Visit: Payer: Medicare PPO

## 2017-03-26 DIAGNOSIS — D72820 Lymphocytosis (symptomatic): Secondary | ICD-10-CM | POA: Diagnosis not present

## 2017-03-26 DIAGNOSIS — E538 Deficiency of other specified B group vitamins: Secondary | ICD-10-CM

## 2017-03-26 MED ORDER — CYANOCOBALAMIN 1000 MCG/ML IJ SOLN
1000.0000 ug | Freq: Once | INTRAMUSCULAR | Status: AC
  Administered 2017-03-26: 1000 ug via INTRAMUSCULAR
  Filled 2017-03-26: qty 1

## 2017-03-27 ENCOUNTER — Inpatient Hospital Stay: Payer: Medicare PPO

## 2017-03-27 DIAGNOSIS — D72820 Lymphocytosis (symptomatic): Secondary | ICD-10-CM | POA: Diagnosis not present

## 2017-03-27 DIAGNOSIS — E538 Deficiency of other specified B group vitamins: Secondary | ICD-10-CM

## 2017-03-27 MED ORDER — CYANOCOBALAMIN 1000 MCG/ML IJ SOLN
1000.0000 ug | Freq: Once | INTRAMUSCULAR | Status: AC
  Administered 2017-03-27: 1000 ug via INTRAMUSCULAR

## 2017-03-28 ENCOUNTER — Other Ambulatory Visit: Payer: Self-pay | Admitting: Oncology

## 2017-03-28 ENCOUNTER — Inpatient Hospital Stay: Payer: Medicare PPO

## 2017-03-28 ENCOUNTER — Ambulatory Visit
Admission: RE | Admit: 2017-03-28 | Discharge: 2017-03-28 | Disposition: A | Discharge status: Home / Self Care | Payer: Medicare PPO | Source: Ambulatory Visit | Attending: Oncology | Admitting: Oncology

## 2017-03-28 DIAGNOSIS — D72820 Lymphocytosis (symptomatic): Secondary | ICD-10-CM

## 2017-03-28 DIAGNOSIS — D696 Thrombocytopenia, unspecified: Secondary | ICD-10-CM | POA: Diagnosis present

## 2017-03-28 DIAGNOSIS — E538 Deficiency of other specified B group vitamins: Secondary | ICD-10-CM

## 2017-03-28 DIAGNOSIS — R161 Splenomegaly, not elsewhere classified: Secondary | ICD-10-CM | POA: Insufficient documentation

## 2017-03-28 MED ORDER — CYANOCOBALAMIN 1000 MCG/ML IJ SOLN
1000.0000 ug | Freq: Once | INTRAMUSCULAR | Status: AC
  Administered 2017-03-28: 1000 ug via INTRAMUSCULAR

## 2017-03-31 ENCOUNTER — Inpatient Hospital Stay: Payer: Medicare PPO

## 2017-03-31 DIAGNOSIS — D72820 Lymphocytosis (symptomatic): Secondary | ICD-10-CM | POA: Diagnosis not present

## 2017-03-31 DIAGNOSIS — E538 Deficiency of other specified B group vitamins: Secondary | ICD-10-CM

## 2017-03-31 MED ORDER — CYANOCOBALAMIN 1000 MCG/ML IJ SOLN
1000.0000 ug | Freq: Once | INTRAMUSCULAR | Status: AC
  Administered 2017-03-31: 1000 ug via INTRAMUSCULAR
  Filled 2017-03-31: qty 1

## 2017-04-07 ENCOUNTER — Inpatient Hospital Stay: Payer: Medicare PPO | Attending: Oncology

## 2017-04-07 DIAGNOSIS — I5022 Chronic systolic (congestive) heart failure: Secondary | ICD-10-CM | POA: Diagnosis not present

## 2017-04-07 DIAGNOSIS — E538 Deficiency of other specified B group vitamins: Secondary | ICD-10-CM

## 2017-04-07 DIAGNOSIS — K760 Fatty (change of) liver, not elsewhere classified: Secondary | ICD-10-CM | POA: Insufficient documentation

## 2017-04-07 DIAGNOSIS — R5383 Other fatigue: Secondary | ICD-10-CM | POA: Diagnosis not present

## 2017-04-07 DIAGNOSIS — I7419 Embolism and thrombosis of other parts of aorta: Secondary | ICD-10-CM | POA: Diagnosis not present

## 2017-04-07 DIAGNOSIS — R161 Splenomegaly, not elsewhere classified: Secondary | ICD-10-CM | POA: Diagnosis not present

## 2017-04-07 DIAGNOSIS — F17210 Nicotine dependence, cigarettes, uncomplicated: Secondary | ICD-10-CM | POA: Insufficient documentation

## 2017-04-07 DIAGNOSIS — Z7982 Long term (current) use of aspirin: Secondary | ICD-10-CM | POA: Diagnosis not present

## 2017-04-07 DIAGNOSIS — D3501 Benign neoplasm of right adrenal gland: Secondary | ICD-10-CM | POA: Diagnosis not present

## 2017-04-07 DIAGNOSIS — I509 Heart failure, unspecified: Secondary | ICD-10-CM | POA: Insufficient documentation

## 2017-04-07 DIAGNOSIS — M069 Rheumatoid arthritis, unspecified: Secondary | ICD-10-CM | POA: Diagnosis not present

## 2017-04-07 DIAGNOSIS — Z79899 Other long term (current) drug therapy: Secondary | ICD-10-CM | POA: Insufficient documentation

## 2017-04-07 DIAGNOSIS — K2270 Barrett's esophagus without dysplasia: Secondary | ICD-10-CM | POA: Insufficient documentation

## 2017-04-07 DIAGNOSIS — I252 Old myocardial infarction: Secondary | ICD-10-CM | POA: Diagnosis not present

## 2017-04-07 DIAGNOSIS — Z7901 Long term (current) use of anticoagulants: Secondary | ICD-10-CM | POA: Diagnosis not present

## 2017-04-07 DIAGNOSIS — R0602 Shortness of breath: Secondary | ICD-10-CM | POA: Insufficient documentation

## 2017-04-07 DIAGNOSIS — D696 Thrombocytopenia, unspecified: Secondary | ICD-10-CM | POA: Insufficient documentation

## 2017-04-07 DIAGNOSIS — M1990 Unspecified osteoarthritis, unspecified site: Secondary | ICD-10-CM | POA: Diagnosis not present

## 2017-04-07 DIAGNOSIS — Z803 Family history of malignant neoplasm of breast: Secondary | ICD-10-CM | POA: Insufficient documentation

## 2017-04-07 DIAGNOSIS — I739 Peripheral vascular disease, unspecified: Secondary | ICD-10-CM | POA: Diagnosis not present

## 2017-04-07 DIAGNOSIS — Z8042 Family history of malignant neoplasm of prostate: Secondary | ICD-10-CM | POA: Insufficient documentation

## 2017-04-07 DIAGNOSIS — N400 Enlarged prostate without lower urinary tract symptoms: Secondary | ICD-10-CM | POA: Diagnosis not present

## 2017-04-07 DIAGNOSIS — I2510 Atherosclerotic heart disease of native coronary artery without angina pectoris: Secondary | ICD-10-CM | POA: Diagnosis not present

## 2017-04-07 DIAGNOSIS — I110 Hypertensive heart disease with heart failure: Secondary | ICD-10-CM | POA: Insufficient documentation

## 2017-04-07 DIAGNOSIS — D72829 Elevated white blood cell count, unspecified: Secondary | ICD-10-CM | POA: Insufficient documentation

## 2017-04-07 MED ORDER — CYANOCOBALAMIN 1000 MCG/ML IJ SOLN
1000.0000 ug | Freq: Once | INTRAMUSCULAR | Status: AC
  Administered 2017-04-07: 1000 ug via INTRAMUSCULAR
  Filled 2017-04-07: qty 1

## 2017-04-09 ENCOUNTER — Encounter: Payer: Self-pay | Admitting: Oncology

## 2017-04-09 ENCOUNTER — Inpatient Hospital Stay (HOSPITAL_BASED_OUTPATIENT_CLINIC_OR_DEPARTMENT_OTHER): Payer: Medicare PPO | Admitting: Oncology

## 2017-04-09 ENCOUNTER — Inpatient Hospital Stay: Payer: Medicare PPO

## 2017-04-09 VITALS — BP 112/84 | HR 84 | Temp 97.5°F | Resp 18 | Wt 213.2 lb

## 2017-04-09 DIAGNOSIS — R5383 Other fatigue: Secondary | ICD-10-CM

## 2017-04-09 DIAGNOSIS — I252 Old myocardial infarction: Secondary | ICD-10-CM | POA: Diagnosis not present

## 2017-04-09 DIAGNOSIS — K2270 Barrett's esophagus without dysplasia: Secondary | ICD-10-CM

## 2017-04-09 DIAGNOSIS — E538 Deficiency of other specified B group vitamins: Secondary | ICD-10-CM | POA: Diagnosis not present

## 2017-04-09 DIAGNOSIS — Z7982 Long term (current) use of aspirin: Secondary | ICD-10-CM

## 2017-04-09 DIAGNOSIS — R0602 Shortness of breath: Secondary | ICD-10-CM

## 2017-04-09 DIAGNOSIS — Z803 Family history of malignant neoplasm of breast: Secondary | ICD-10-CM

## 2017-04-09 DIAGNOSIS — I739 Peripheral vascular disease, unspecified: Secondary | ICD-10-CM

## 2017-04-09 DIAGNOSIS — I509 Heart failure, unspecified: Secondary | ICD-10-CM

## 2017-04-09 DIAGNOSIS — I5022 Chronic systolic (congestive) heart failure: Secondary | ICD-10-CM

## 2017-04-09 DIAGNOSIS — Z8042 Family history of malignant neoplasm of prostate: Secondary | ICD-10-CM

## 2017-04-09 DIAGNOSIS — F17210 Nicotine dependence, cigarettes, uncomplicated: Secondary | ICD-10-CM

## 2017-04-09 DIAGNOSIS — R161 Splenomegaly, not elsewhere classified: Secondary | ICD-10-CM | POA: Diagnosis not present

## 2017-04-09 DIAGNOSIS — D696 Thrombocytopenia, unspecified: Secondary | ICD-10-CM | POA: Diagnosis not present

## 2017-04-09 DIAGNOSIS — D72829 Elevated white blood cell count, unspecified: Secondary | ICD-10-CM | POA: Diagnosis not present

## 2017-04-09 DIAGNOSIS — K760 Fatty (change of) liver, not elsewhere classified: Secondary | ICD-10-CM

## 2017-04-09 DIAGNOSIS — Z7901 Long term (current) use of anticoagulants: Secondary | ICD-10-CM

## 2017-04-09 DIAGNOSIS — M069 Rheumatoid arthritis, unspecified: Secondary | ICD-10-CM

## 2017-04-09 DIAGNOSIS — D72820 Lymphocytosis (symptomatic): Secondary | ICD-10-CM

## 2017-04-09 DIAGNOSIS — D3501 Benign neoplasm of right adrenal gland: Secondary | ICD-10-CM

## 2017-04-09 DIAGNOSIS — I2510 Atherosclerotic heart disease of native coronary artery without angina pectoris: Secondary | ICD-10-CM

## 2017-04-09 DIAGNOSIS — Z79899 Other long term (current) drug therapy: Secondary | ICD-10-CM | POA: Diagnosis not present

## 2017-04-09 DIAGNOSIS — N400 Enlarged prostate without lower urinary tract symptoms: Secondary | ICD-10-CM

## 2017-04-09 DIAGNOSIS — I7419 Embolism and thrombosis of other parts of aorta: Secondary | ICD-10-CM

## 2017-04-09 DIAGNOSIS — I110 Hypertensive heart disease with heart failure: Secondary | ICD-10-CM

## 2017-04-09 DIAGNOSIS — M1990 Unspecified osteoarthritis, unspecified site: Secondary | ICD-10-CM

## 2017-04-09 DIAGNOSIS — C8510 Unspecified B-cell lymphoma, unspecified site: Secondary | ICD-10-CM

## 2017-04-09 LAB — CBC WITH DIFFERENTIAL/PLATELET
Basophils Absolute: 0.1 10*3/uL (ref 0–0.1)
Basophils Relative: 1 %
Eosinophils Absolute: 0 10*3/uL (ref 0–0.7)
Eosinophils Relative: 1 %
HCT: 43.1 % (ref 40.0–52.0)
Hemoglobin: 14.4 g/dL (ref 13.0–18.0)
Lymphocytes Relative: 65 %
Lymphs Abs: 5.7 10*3/uL — ABNORMAL HIGH (ref 1.0–3.6)
MCH: 27.4 pg (ref 26.0–34.0)
MCHC: 33.3 g/dL (ref 32.0–36.0)
MCV: 82.2 fL (ref 80.0–100.0)
Monocytes Absolute: 0.3 10*3/uL (ref 0.2–1.0)
Monocytes Relative: 4 %
Neutro Abs: 2.7 10*3/uL (ref 1.4–6.5)
Neutrophils Relative %: 31 %
Platelets: 119 10*3/uL — ABNORMAL LOW (ref 150–440)
RBC: 5.25 MIL/uL (ref 4.40–5.90)
RDW: 17.5 % — ABNORMAL HIGH (ref 11.5–14.5)
WBC: 8.8 10*3/uL (ref 3.8–10.6)

## 2017-04-09 LAB — BILIRUBIN, TOTAL: Total Bilirubin: 1.7 mg/dL — ABNORMAL HIGH (ref 0.3–1.2)

## 2017-04-09 LAB — VITAMIN B12: Vitamin B-12: 1825 pg/mL — ABNORMAL HIGH (ref 180–914)

## 2017-04-09 LAB — BILIRUBIN, DIRECT: BILIRUBIN DIRECT: 0.3 mg/dL (ref 0.1–0.5)

## 2017-04-09 NOTE — Progress Notes (Signed)
Pt in for follow up and results.   °

## 2017-04-10 LAB — ANTI-PARIETAL ANTIBODY: Parietal Cell Antibody-IgG: 3.4 Units (ref 0.0–20.0)

## 2017-04-10 LAB — INTRINSIC FACTOR ANTIBODIES: Intrinsic Factor: 1 AU/mL (ref 0.0–1.1)

## 2017-04-10 NOTE — Progress Notes (Signed)
Hematology/Oncology Consult note [facility] [phone] [phone]   Patient Care Team: [name], MD as PCP - General (Internal Medicine)  REFERRING PROVIDER: [name], MD CHIEF COMPLAINTS/PURPOSE OF CONSULTATION:  Evaluation of lymphocytosis  HISTORY OF PRESENTING ILLNESS:  Martin Adkins is a  72 y.o.  male with PMH listed below who was referred to me for evaluation of lymphocytosis.  Recent lab work on 03/05/2017 showed wbc 11.4, hb 14.6, platelet count 117,000, lymphocytosis 63.5%, neutrophil 22%, Report chronic fatigue, no weight loss, fever or chills.  He reports feeling chest "rattleness". Has for CT chest which showed acute finding, chronic finding includes  postsurgical changes consistent with coronary bypass grafting. One of the bypass grafts arising from the aorta shows apparent thrombosis and an area of distal aneurysmal dilatation which is also Thrombosed. Right adrenal adenoma stable in appearance.Mild scarring in the lung bases.Fatty liver.   Takes Aspirin 81mg , coumadine, plavix. Denies any bleeding events. Use to drink hard liquir daily, quitted for 4-5 years. Current drinks wine on weekend.   INTERVAL HISTORY Martin Adkins is a 72 y.o. male who has above history reviewed by me today presents for follow up visit for management of thrombocytopenia.  He has had lab workup at the present to discuss above results.  Denies any acute bleeding.  He feels.   Review of Systems  Constitutional: Positive for malaise/fatigue. Negative for chills, fever and weight loss.  HENT: Negative for nosebleeds.   Eyes: Negative for double vision, photophobia and pain.  Respiratory: Positive for shortness of breath. Negative for cough.   Cardiovascular: Negative for chest pain, orthopnea and claudication.  Gastrointestinal: Negative for abdominal pain, blood in stool, constipation, diarrhea, heartburn and vomiting.    Genitourinary: Negative for dysuria, frequency and urgency.  Musculoskeletal: Negative for myalgias.  Skin: Negative for rash.  Neurological: Negative for tingling, tremors and speech change.  Endo/Heme/Allergies: Does not bruise/bleed easily.  Psychiatric/Behavioral: Negative for depression and suicidal ideas.    MEDICAL HISTORY:  Past Medical History:  Diagnosis Date  . AAA (abdominal aortic aneurysm) without rupture ([place]) 04/05/2014  . AAA (abdominal aortic aneurysm) without rupture ([place]) 04/05/2014  . Abdominal aortic aneurysm without rupture ([place])   . Acute non-ST elevation myocardial infarction (NSTEMI) ([place]) 08/15/2014  . 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/28/2014  . APS (antiphospholipid syndrome) ([place])   . Arteriosclerosis of coronary artery 04/05/2014   Overview:  Sp cabg with lima to lad svg to d1 om1 rca 2004 with occluded svg to rca and d1 and pci stetn of om1 graft 2015   . Arthritis   . B12 deficiency 03/21/2017  . Barrett's esophagus   . Benign essential HTN 05/02/2014  . BPH (benign prostatic hyperplasia)   . CAD (coronary artery disease)   . CHF (congestive heart failure) ([place])   . Chronic systolic heart failure ([place]) 04/19/2014   Overview:  With segmental LV systolic dysfunction ejection fraction of 35%   . Diabetes ([place])   . DVT (deep venous thrombosis) ([place])   . Dysrhythmia    ATRIAL FIB.  Marland Kitchen GERD (gastroesophageal reflux disease)   . History of hiatal hernia   . History of shingles 2013  . Hyperlipemia   . Hyperplastic polyps of stomach   . Hypertension   . Myocardial infarction ([place]) 07/30/2014  . NSTEMI (non-ST elevated myocardial infarction) ([place])   . OSA (obstructive sleep apnea)   . Osteoarthritis   . PVD (peripheral vascular disease) ([place])   .  RA (rheumatoid arthritis) ([place])   . SOB (shortness of breath)     SURGICAL HISTORY: Past Surgical History:  Procedure Laterality Date  . ABDOMINAL AORTA STENT    . CHOLECYSTECTOMY    . COLONOSCOPY   11/24/2009  . CORONARY ANGIOPLASTY WITH STENT PLACEMENT  2015  . CORONARY ARTERY BYPASS GRAFT    . ESOPHAGOGASTRODUODENOSCOPY  05/26/02  03/23/12  . ESOPHAGOGASTRODUODENOSCOPY (EGD) WITH PROPOFOL N/A 10/28/2016   Procedure: ESOPHAGOGASTRODUODENOSCOPY (EGD) WITH PROPOFOL;  Surgeon: Manya Silvas, MD;  Location: [facility] ENDOSCOPY;  Service: Endoscopy;  Laterality: N/A;  . PERIPHERAL VASCULAR CATHETERIZATION N/A 09/06/2014   Procedure: IVC Filter Removal;  Surgeon: Katha Cabal, MD;  Location: [place] CV LAB;  Service: Cardiovascular;  Laterality: N/A;  . TRANSURETHRAL RESECTION OF PROSTATE N/A 02/20/2015   Procedure: TRANSURETHRAL RESECTION OF THE PROSTATE (TURP);  Surgeon: Hollice Espy, MD;  Location: [facility] ORS;  Service: Urology;  Laterality: N/A;    SOCIAL HISTORY: Social History   Socioeconomic History  . Marital status: Legally Separated    Spouse name: Not on file  . Number of children: Not on file  . Years of education: Not on file  . Highest education level: Not on file  Occupational History  . Not on file  Social Needs  . Financial resource strain: Not on file  . Food insecurity:    Worry: Not on file    Inability: Not on file  . Transportation needs:    Medical: Not on file    Non-medical: Not on file  Tobacco Use  . Smoking status: Current Some Day Smoker    Packs/day: 0.25    Years: 20.00    Pack years: 5.00    Types: Cigarettes  . Smokeless tobacco: Never Used  . Tobacco comment: per pt nicotine patch number7mg  -smokes 1-3 cig/per d now  Substance and Sexual Activity  . Alcohol use: Yes    Alcohol/week: 0.0 oz    Comment: social on weekends  . Drug use: No  . Sexual activity: Never  Lifestyle  . Physical activity:    Days per week: Not on file    Minutes per session: Not on file  . Stress: Not on file  Relationships  . Social connections:    Talks on phone: Not on file    Gets together: Not on file    Attends religious service: Not on file     Active member of club or organization: Not on file    Attends meetings of clubs or organizations: Not on file    Relationship status: Not on file  . Intimate partner violence:    Fear of current or ex partner: Not on file    Emotionally abused: Not on file    Physically abused: Not on file    Forced sexual activity: Not on file  Other Topics Concern  . Not on file  Social History Narrative  . Not on file    FAMILY HISTORY: Family History  Problem Relation Age of Onset  . Cancer Mother 12       breast ca  . Diabetes Mother   . Coronary artery disease Father   . Heart attack Father   . Prostate cancer Brother   . Bladder Cancer Neg Hx   . Kidney cancer Neg Hx     ALLERGIES:  is allergic to ace inhibitors; pravastatin; rosuvastatin; simvastatin; amoxicillin; niacin; and penicillins.  MEDICATIONS:  Current Outpatient Medications  Medication Sig Dispense Refill  . albuterol (PROVENTIL  HFA;VENTOLIN HFA) 108 (90 Base) MCG/ACT inhaler Inhale 2 puffs into the lungs every 6 (six) hours as needed for wheezing or shortness of breath. 1 Inhaler 0  . amLODipine (NORVASC) 10 MG tablet Take 10 mg by mouth daily.     Marland Kitchen aspirin EC 81 MG tablet Take 81 mg by mouth daily.    Marland Kitchen atorvastatin (LIPITOR) 80 MG tablet TAKE 1 TABLET (80 MG TOTAL) BY MOUTH ONCE DAILY.  11  . chlorthalidone (HYGROTON) 25 MG tablet Take 25 mg by mouth daily.     . clopidogrel (PLAVIX) 75 MG tablet Reported on 01/23/2015  11  . fluticasone (FLONASE) 50 MCG/ACT nasal spray Place 2 sprays into both nostrils daily.     . furosemide (LASIX) 20 MG tablet Take 20 mg by mouth daily.  11  . gabapentin (NEURONTIN) 300 MG capsule Take 300 mg by mouth 2 (two) times daily.    . hydroxychloroquine (PLAQUENIL) 200 MG tablet Take 1 tablet by mouth daily.    . isosorbide mononitrate (IMDUR) 30 MG 24 hr tablet Take 30 mg by mouth daily.     Marland Kitchen losartan (COZAAR) 100 MG tablet 100 mg daily.     Phillis Knack EYE DROPS 0.4-0.3 % SOLN Apply 1  drop to eye daily.     . metoprolol succinate (TOPROL-XL) 100 MG 24 hr tablet Take 100 mg by mouth daily.     Marland Kitchen Neomycin-Bacitracin-Polymyxin (HCA TRIPLE ANTIBIOTIC OINTMENT EX) Reported on 02/20/2015    . nitroGLYCERIN (NITROSTAT) 0.4 MG SL tablet Place 1 tablet (0.4 mg total) under the tongue every 5 (five) minutes as needed for Chest pain. May take up to 3 doses.    . pantoprazole (PROTONIX) 40 MG tablet Take 40 mg by mouth daily.     . potassium chloride SA (K-DUR,KLOR-CON) 20 MEQ tablet Take 20 mEq by mouth daily.     . valsartan (DIOVAN) 160 MG tablet Take 160 mg by mouth 2 (two) times daily.     Marland Kitchen warfarin (COUMADIN) 1 MG tablet Take by mouth daily. Patient takes once a week with 5mg  tablet for a total of 6 mg per day    . warfarin (COUMADIN) 5 MG tablet TAKE 1 TABLET (5 MG TOTAL) BY MOUTH ONCE DAILY.  5  . docusate sodium (COLACE) 100 MG capsule Take 1 capsule (100 mg total) by mouth 2 (two) times daily. (Patient not taking: Reported on 04/09/2017) 60 capsule 0   No current facility-administered medications for this visit.      PHYSICAL EXAMINATION: ECOG PERFORMANCE STATUS: 1 - Symptomatic but completely ambulatory Vitals:   04/09/17 1102  BP: 112/84  Pulse: 84  Resp: 18  Temp: (!) 97.5 F (36.4 C)   Filed Weights   04/09/17 1102  Weight: 213 lb 4 oz (96.7 kg)    Physical Exam  Constitutional: He is oriented to person, place, and time and well-developed, well-nourished, and in no distress. No distress.  HENT:  Head: Normocephalic.  Mouth/Throat: No oropharyngeal exudate.  Eyes: Pupils are equal, round, and reactive to light. EOM are normal. No scleral icterus.  Neck: Normal range of motion. Neck supple.  Cardiovascular: Normal rate.  Pulmonary/Chest: Effort normal and breath sounds normal. No respiratory distress.  Abdominal: Bowel sounds are normal. He exhibits no distension.  Musculoskeletal: He exhibits no edema.  Lymphadenopathy:    He has no cervical adenopathy.    Neurological: He is alert and oriented to person, place, and time. Coordination normal.  Skin: Skin is warm  and dry.  Psychiatric: Affect and judgment normal.     LABORATORY DATA:  I have reviewed the data as listed Lab Results  Component Value Date   WBC 8.8 04/09/2017   HGB 14.4 04/09/2017   HCT 43.1 04/09/2017   MCV 82.2 04/09/2017   PLT 119 (L) 04/09/2017   Recent Labs    08/13/16 0325 11/19/16 1126 03/21/17 1243 04/09/17 1155  NA 135  --  136  --   K 3.8  --  4.5  --   CL 101  --  104  --   CO2 22  --  24  --   GLUCOSE 127*  --  112*  --   BUN 34*  --  18  --   CREATININE 1.99* 1.00 1.00  --   CALCIUM 9.0  --  9.0  --   GFRNONAA 33*  --  >60  --   GFRAA 38*  --  >60  --   PROT  --   --  8.0  --   ALBUMIN  --   --  4.3  --   AST  --   --  44*  --   ALT  --   --  33  --   ALKPHOS  --   --  88  --   BILITOT  --   --  1.5* 1.7*  BILIDIR  --   --   --  0.3    RADIOGRAPHIC STUDIES: I have personally reviewed the radiological images as listed and agreed with the findings in the report. IMPRESSION: 1.  Splenomegaly.  No focal splenic lesions evident. 2. Increased liver echogenicity, a finding most likely indicative of hepatic steatosis. While no focal liver lesions are evident on this study, it must be cautioned that the sensitivity of ultrasound for detection of focal liver lesions is diminished in this circumstance.  3. Pancreas obscured by gas. Portions of inferior vena cava obscured by gas.  4.  Gallbladder absent.  5. Status post abdominal aortic aneurysm repair. No periaortic fluid.  ASSESSMENT & PLAN:  1. Thrombocytopenia ([place])   2. B12 deficiency   3. Lymphocytosis   4. B-cell lymphoma, unspecified B-cell lymphoma type, unspecified body region ([place])   5. Splenomegaly   6. Hyperbilirubinemia   Lab results were discussed with patient. # He has vitamin B12 deficiency and has been started on vitamin B12 injections. #Peripheral blood flow  cytometry is positive for CD5 positive, CD 23- monoclonal B-cell cannot distinguish if CLL or mantle cell lymphoma.  Splenomegaly which can contribute to his thrombocytopenia.  #Check intrinsic factor antibodies, antiparietal antibody.  Repeat B12 level. Hyperbilirubinemia, mostly indirect hyperbilirubinemia, indicating peripheral destruction of RBCs.  No signs of anemia.  # Patient follow-up with me in approximately 1 week  to review the above results. # Advise achol cessation. .   All questions were answered. The patient knows to call the clinic with any problems questions or concerns.  Return of visit: 1 week.   Thank you for this kind referral and the opportunity to participate in the care of this patient. A copy of today's note is routed to referring provider    Earlie Server, MD, PhD Hematology Oncology [facility] at [facility] Pager- [phone] 04/10/2017

## 2017-04-11 ENCOUNTER — Ambulatory Visit: Payer: Medicare PPO | Admitting: Oncology

## 2017-04-14 ENCOUNTER — Inpatient Hospital Stay: Payer: Medicare PPO

## 2017-04-14 DIAGNOSIS — E538 Deficiency of other specified B group vitamins: Secondary | ICD-10-CM

## 2017-04-14 MED ORDER — CYANOCOBALAMIN 1000 MCG/ML IJ SOLN
1000.0000 ug | Freq: Once | INTRAMUSCULAR | Status: AC
  Administered 2017-04-14: 1000 ug via INTRAMUSCULAR

## 2017-04-16 ENCOUNTER — Encounter: Payer: Self-pay | Admitting: Oncology

## 2017-04-16 ENCOUNTER — Inpatient Hospital Stay (HOSPITAL_BASED_OUTPATIENT_CLINIC_OR_DEPARTMENT_OTHER): Payer: Medicare PPO | Admitting: Oncology

## 2017-04-16 VITALS — BP 110/77 | HR 77 | Temp 98.0°F | Resp 18 | Wt 211.0 lb

## 2017-04-16 DIAGNOSIS — N400 Enlarged prostate without lower urinary tract symptoms: Secondary | ICD-10-CM

## 2017-04-16 DIAGNOSIS — D696 Thrombocytopenia, unspecified: Secondary | ICD-10-CM | POA: Diagnosis not present

## 2017-04-16 DIAGNOSIS — R0602 Shortness of breath: Secondary | ICD-10-CM

## 2017-04-16 DIAGNOSIS — I714 Abdominal aortic aneurysm, without rupture: Secondary | ICD-10-CM

## 2017-04-16 DIAGNOSIS — F17210 Nicotine dependence, cigarettes, uncomplicated: Secondary | ICD-10-CM

## 2017-04-16 DIAGNOSIS — I252 Old myocardial infarction: Secondary | ICD-10-CM

## 2017-04-16 DIAGNOSIS — R5383 Other fatigue: Secondary | ICD-10-CM | POA: Diagnosis not present

## 2017-04-16 DIAGNOSIS — D72829 Elevated white blood cell count, unspecified: Secondary | ICD-10-CM | POA: Diagnosis not present

## 2017-04-16 DIAGNOSIS — D3501 Benign neoplasm of right adrenal gland: Secondary | ICD-10-CM | POA: Diagnosis not present

## 2017-04-16 DIAGNOSIS — Z79899 Other long term (current) drug therapy: Secondary | ICD-10-CM | POA: Diagnosis not present

## 2017-04-16 DIAGNOSIS — C8510 Unspecified B-cell lymphoma, unspecified site: Secondary | ICD-10-CM

## 2017-04-16 DIAGNOSIS — Z8042 Family history of malignant neoplasm of prostate: Secondary | ICD-10-CM

## 2017-04-16 DIAGNOSIS — Z7982 Long term (current) use of aspirin: Secondary | ICD-10-CM | POA: Diagnosis not present

## 2017-04-16 DIAGNOSIS — K2270 Barrett's esophagus without dysplasia: Secondary | ICD-10-CM

## 2017-04-16 DIAGNOSIS — Z803 Family history of malignant neoplasm of breast: Secondary | ICD-10-CM

## 2017-04-16 DIAGNOSIS — R161 Splenomegaly, not elsewhere classified: Secondary | ICD-10-CM | POA: Diagnosis not present

## 2017-04-16 DIAGNOSIS — D72820 Lymphocytosis (symptomatic): Secondary | ICD-10-CM

## 2017-04-16 DIAGNOSIS — Z7901 Long term (current) use of anticoagulants: Secondary | ICD-10-CM

## 2017-04-16 DIAGNOSIS — I739 Peripheral vascular disease, unspecified: Secondary | ICD-10-CM

## 2017-04-16 DIAGNOSIS — E538 Deficiency of other specified B group vitamins: Secondary | ICD-10-CM

## 2017-04-16 DIAGNOSIS — I509 Heart failure, unspecified: Secondary | ICD-10-CM

## 2017-04-16 DIAGNOSIS — M069 Rheumatoid arthritis, unspecified: Secondary | ICD-10-CM

## 2017-04-16 DIAGNOSIS — K760 Fatty (change of) liver, not elsewhere classified: Secondary | ICD-10-CM

## 2017-04-16 DIAGNOSIS — M1990 Unspecified osteoarthritis, unspecified site: Secondary | ICD-10-CM

## 2017-04-16 DIAGNOSIS — I110 Hypertensive heart disease with heart failure: Secondary | ICD-10-CM

## 2017-04-16 LAB — FISH HES LEUKEMIA, 4Q12 REA

## 2017-04-16 MED ORDER — VITAMIN B-12 100 MCG PO TABS: 100.0000 ug | ORAL_TABLET | Freq: Every day | ORAL | 1 refills | Status: DC

## 2017-04-16 NOTE — Progress Notes (Signed)
Pt in for follow up.  Reports still very fatigued and has "no energy".  Pt also states gets short of breath very easily.

## 2017-04-16 NOTE — Progress Notes (Signed)
Hematology/Oncology Consult note [facility] [phone] [phone]   Patient Care Team: [name], MD as PCP - General (Internal Medicine)  REFERRING PROVIDER: [name], MD CHIEF COMPLAINTS/PURPOSE OF CONSULTATION:  Evaluation of lymphocytosis  HISTORY OF PRESENTING ILLNESS:  Martin Adkins is a  72 y.o.  male with PMH listed below who was referred to me for evaluation of lymphocytosis.  Recent lab work on 03/05/2017 showed wbc 11.4, hb 14.6, platelet count 117,000, lymphocytosis 63.5%, neutrophil 22%, Report chronic fatigue, no weight loss, fever or chills.  He reports feeling chest "rattleness". Has for CT chest which showed acute finding, chronic finding includes  postsurgical changes consistent with coronary bypass grafting. One of the bypass grafts arising from the aorta shows apparent thrombosis and an area of distal aneurysmal dilatation which is also Thrombosed. Right adrenal adenoma stable in appearance.Mild scarring in the lung bases.Fatty liver.   Takes Aspirin 81mg , coumadine, plavix. Denies any bleeding events. Use to drink hard liquir daily, quitted for 4-5 years. Current drinks wine on weekend.   INTERVAL HISTORY Martin Adkins is a 72 y.o. male who has above history reviewed by me today presents for follow up visit for management of thrombocytopenia, splenomegaly. .he feel tired and reports "feeling rattled" in his chest. Weight is stable.  He has received parental B12 injections and antiparietal antibody and intrinsic antibody negative.     Review of Systems  Constitutional: Positive for malaise/fatigue. Negative for chills, fever and weight loss.  HENT: Negative for nosebleeds.   Eyes: Negative for double vision, photophobia and pain.  Respiratory: Positive for shortness of breath. Negative for cough.   Cardiovascular: Negative for chest pain, orthopnea and claudication.  Gastrointestinal: Negative  for abdominal pain, blood in stool, constipation, diarrhea, heartburn and vomiting.  Genitourinary: Negative for dysuria, frequency and urgency.  Musculoskeletal: Negative for myalgias.  Skin: Negative for rash.  Neurological: Negative for tingling, tremors and speech change.  Endo/Heme/Allergies: Does not bruise/bleed easily.  Psychiatric/Behavioral: Negative for depression and suicidal ideas.    MEDICAL HISTORY:  Past Medical History:  Diagnosis Date  . AAA (abdominal aortic aneurysm) without rupture ([place]) 04/05/2014  . AAA (abdominal aortic aneurysm) without rupture ([place]) 04/05/2014  . Abdominal aortic aneurysm without rupture ([place])   . Acute non-ST elevation myocardial infarction (NSTEMI) ([place]) 08/15/2014  . 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/28/2014  . APS (antiphospholipid syndrome) ([place])   . Arteriosclerosis of coronary artery 04/05/2014   Overview:  Sp cabg with lima to lad svg to d1 om1 rca 2004 with occluded svg to rca and d1 and pci stetn of om1 graft 2015   . Arthritis   . B12 deficiency 03/21/2017  . Barrett's esophagus   . Benign essential HTN 05/02/2014  . BPH (benign prostatic hyperplasia)   . CAD (coronary artery disease)   . CHF (congestive heart failure) ([place])   . Chronic systolic heart failure ([place]) 04/19/2014   Overview:  With segmental LV systolic dysfunction ejection fraction of 35%   . Diabetes ([place])   . DVT (deep venous thrombosis) ([place])   . Dysrhythmia    ATRIAL FIB.  Marland Kitchen GERD (gastroesophageal reflux disease)   . History of hiatal hernia   . History of shingles 2013  . Hyperlipemia   . Hyperplastic polyps of stomach   . Hypertension   . Myocardial infarction ([place]) 07/30/2014  . NSTEMI (non-ST elevated myocardial infarction) ([place])   . OSA (obstructive sleep apnea)   . Osteoarthritis   .  PVD (peripheral vascular disease) ([place])   . RA (rheumatoid arthritis) ([place])   . SOB (shortness of breath)     SURGICAL HISTORY: Past Surgical History:  Procedure  Laterality Date  . ABDOMINAL AORTA STENT    . CHOLECYSTECTOMY    . COLONOSCOPY  11/24/2009  . CORONARY ANGIOPLASTY WITH STENT PLACEMENT  2015  . CORONARY ARTERY BYPASS GRAFT    . ESOPHAGOGASTRODUODENOSCOPY  05/26/02  03/23/12  . ESOPHAGOGASTRODUODENOSCOPY (EGD) WITH PROPOFOL N/A 10/28/2016   Procedure: ESOPHAGOGASTRODUODENOSCOPY (EGD) WITH PROPOFOL;  Surgeon: Manya Silvas, MD;  Location: [facility] ENDOSCOPY;  Service: Endoscopy;  Laterality: N/A;  . PERIPHERAL VASCULAR CATHETERIZATION N/A 09/06/2014   Procedure: IVC Filter Removal;  Surgeon: Katha Cabal, MD;  Location: [facility] CV LAB;  Service: Cardiovascular;  Laterality: N/A;  . TRANSURETHRAL RESECTION OF PROSTATE N/A 02/20/2015   Procedure: TRANSURETHRAL RESECTION OF THE PROSTATE (TURP);  Surgeon: Hollice Espy, MD;  Location: [facility] ORS;  Service: Urology;  Laterality: N/A;    SOCIAL HISTORY: Social History   Socioeconomic History  . Marital status: Legally Separated    Spouse name: Not on file  . Number of children: Not on file  . Years of education: Not on file  . Highest education level: Not on file  Occupational History  . Not on file  Social Needs  . Financial resource strain: Not on file  . Food insecurity:    Worry: Not on file    Inability: Not on file  . Transportation needs:    Medical: Not on file    Non-medical: Not on file  Tobacco Use  . Smoking status: Current Some Day Smoker    Packs/day: 0.25    Years: 20.00    Pack years: 5.00    Types: Cigarettes  . Smokeless tobacco: Never Used  . Tobacco comment: per pt nicotine patch number7mg  -smokes 1-3 cig/per d now  Substance and Sexual Activity  . Alcohol use: Yes    Alcohol/week: 0.0 oz    Comment: social on weekends  . Drug use: No  . Sexual activity: Never  Lifestyle  . Physical activity:    Days per week: Not on file    Minutes per session: Not on file  . Stress: Not on file  Relationships  . Social connections:    Talks on phone: Not on  file    Gets together: Not on file    Attends religious service: Not on file    Active member of club or organization: Not on file    Attends meetings of clubs or organizations: Not on file    Relationship status: Not on file  . Intimate partner violence:    Fear of current or ex partner: Not on file    Emotionally abused: Not on file    Physically abused: Not on file    Forced sexual activity: Not on file  Other Topics Concern  . Not on file  Social History Narrative  . Not on file    FAMILY HISTORY: Family History  Problem Relation Age of Onset  . Cancer Mother 62       breast ca  . Diabetes Mother   . Coronary artery disease Father   . Heart attack Father   . Prostate cancer Brother   . Bladder Cancer Neg Hx   . Kidney cancer Neg Hx     ALLERGIES:  is allergic to ace inhibitors; pravastatin; rosuvastatin; simvastatin; amoxicillin; niacin; and penicillins.  MEDICATIONS:  Current Outpatient Medications  Medication Sig Dispense Refill  . albuterol (PROVENTIL HFA;VENTOLIN HFA) 108 (90 Base) MCG/ACT inhaler Inhale 2 puffs into the lungs every 6 (six) hours as needed for wheezing or shortness of breath. 1 Inhaler 0  . amLODipine (NORVASC) 10 MG tablet Take 10 mg by mouth daily.     Marland Kitchen aspirin EC 81 MG tablet Take 81 mg by mouth daily.    Marland Kitchen atorvastatin (LIPITOR) 80 MG tablet TAKE 1 TABLET (80 MG TOTAL) BY MOUTH ONCE DAILY.  11  . chlorthalidone (HYGROTON) 25 MG tablet Take 25 mg by mouth daily.     . fluticasone (FLONASE) 50 MCG/ACT nasal spray Place 2 sprays into both nostrils daily.     . furosemide (LASIX) 20 MG tablet Take 20 mg by mouth daily.  11  . gabapentin (NEURONTIN) 300 MG capsule Take 300 mg by mouth 2 (two) times daily.    . hydroxychloroquine (PLAQUENIL) 200 MG tablet Take 1 tablet by mouth daily.    . isosorbide mononitrate (IMDUR) 30 MG 24 hr tablet Take 30 mg by mouth daily.     Marland Kitchen losartan (COZAAR) 100 MG tablet 100 mg daily.     Phillis Knack EYE DROPS  0.4-0.3 % SOLN Apply 1 drop to eye daily.     . metoprolol succinate (TOPROL-XL) 100 MG 24 hr tablet Take 100 mg by mouth daily.     . pantoprazole (PROTONIX) 40 MG tablet Take 40 mg by mouth daily.     . potassium chloride SA (K-DUR,KLOR-CON) 20 MEQ tablet Take 20 mEq by mouth daily.     . valsartan (DIOVAN) 160 MG tablet Take 160 mg by mouth 2 (two) times daily.     Marland Kitchen warfarin (COUMADIN) 1 MG tablet Take by mouth daily. Patient takes once a week with 5mg  tablet for a total of 6 mg per day    . warfarin (COUMADIN) 5 MG tablet TAKE 1 TABLET (5 MG TOTAL) BY MOUTH ONCE DAILY.  5  . docusate sodium (COLACE) 100 MG capsule Take 1 capsule (100 mg total) by mouth 2 (two) times daily. (Patient not taking: Reported on 04/09/2017) 60 capsule 0   No current facility-administered medications for this visit.      PHYSICAL EXAMINATION: ECOG PERFORMANCE STATUS: 1 - Symptomatic but completely ambulatory Vitals:   04/16/17 1104  BP: 110/77  Pulse: 77  Resp: 18  Temp: 98 F (36.7 C)  SpO2: 99%   Filed Weights   04/16/17 1104  Weight: 211 lb (95.7 kg)    Physical Exam  Constitutional: He is oriented to person, place, and time and well-developed, well-nourished, and in no distress. No distress.  HENT:  Head: Normocephalic.  Mouth/Throat: No oropharyngeal exudate.  Eyes: Pupils are equal, round, and reactive to light. EOM are normal. No scleral icterus.  Neck: Normal range of motion. Neck supple.  Cardiovascular: Normal rate and normal heart sounds.  Pulmonary/Chest: Effort normal and breath sounds normal. No respiratory distress.  Abdominal: Bowel sounds are normal. He exhibits no distension and no mass. There is no guarding.  Musculoskeletal: He exhibits no edema.  Lymphadenopathy:    He has no cervical adenopathy.  Neurological: He is alert and oriented to person, place, and time. No cranial nerve deficit. Coordination normal.  Skin: Skin is warm. No erythema.  Psychiatric: Affect and  judgment normal.     LABORATORY DATA:  I have reviewed the data as listed Lab Results  Component Value Date   WBC 8.8 04/09/2017   HGB 14.4  04/09/2017   HCT 43.1 04/09/2017   MCV 82.2 04/09/2017   PLT 119 (L) 04/09/2017   Recent Labs    08/13/16 0325 11/19/16 1126 03/21/17 1243 04/09/17 1155  NA 135  --  136  --   K 3.8  --  4.5  --   CL 101  --  104  --   CO2 22  --  24  --   GLUCOSE 127*  --  112*  --   BUN 34*  --  18  --   CREATININE 1.99* 1.00 1.00  --   CALCIUM 9.0  --  9.0  --   GFRNONAA 33*  --  >60  --   GFRAA 38*  --  >60  --   PROT  --   --  8.0  --   ALBUMIN  --   --  4.3  --   AST  --   --  44*  --   ALT  --   --  33  --   ALKPHOS  --   --  88  --   BILITOT  --   --  1.5* 1.7*  BILIDIR  --   --   --  0.3    RADIOGRAPHIC STUDIES: I have personally reviewed the radiological images as listed and agreed with the findings in the report. IMPRESSION: 1.  Splenomegaly.  No focal splenic lesions evident. 2. Increased liver echogenicity, a finding most likely indicative of hepatic steatosis. While no focal liver lesions are evident on this study, it must be cautioned that the sensitivity of ultrasound for detection of focal liver lesions is diminished in this circumstance.  3. Pancreas obscured by gas. Portions of inferior vena cava obscured by gas.  4.  Gallbladder absent.  5. Status post abdominal aortic aneurysm repair. No periaortic fluid.  ASSESSMENT & PLAN:  1. B12 deficiency   2. Thrombocytopenia (HCC)   3. Lymphocytosis   4. B-cell lymphoma, unspecified B-cell lymphoma type, unspecified body region ([place])   5. Splenomegaly    # B12 deficiency: improved after parental B12 injections. antiparietal antibody and intrinsic antibody negative.  Will start patient on daily vitamin B12 152mcg oral supplements.   #Peripheral blood flow cytometry is positive for CD5 positive, CD 23- monoclonal B-cell cannot distinguish if CLL or mantle cell  lymphoma.  Splenomegaly which can contribute to his thrombocytopenia. Discuss with patient that I recommend obtaining PET scan for evaluation, if any hypermetabolic lymphadenopathy, will proceed with biopsy.   # Hyperbilirubinemia, mostly indirect hyperbilirubinemia, indicating peripheral destruction of RBCs.  No signs of anemia.   # Advise achol cessation. .   All questions were answered. The patient knows to call the clinic with any problems questions or concerns.  Return of visit: 4 weeks.  Thank you for this kind referral and the opportunity to participate in the care of this patient. A copy of today's note is routed to referring provider    Earlie Server, MD, PhD Hematology Oncology [facility] Pager- [phone] 04/16/2017

## 2017-04-21 ENCOUNTER — Inpatient Hospital Stay: Payer: Medicare PPO

## 2017-04-21 ENCOUNTER — Encounter
Admission: RE | Admit: 2017-04-21 | Discharge: 2017-04-21 | Disposition: A | Discharge status: Home / Self Care | Payer: Medicare PPO | Source: Ambulatory Visit | Attending: Oncology | Admitting: Oncology

## 2017-04-21 DIAGNOSIS — C8510 Unspecified B-cell lymphoma, unspecified site: Secondary | ICD-10-CM | POA: Diagnosis present

## 2017-04-21 DIAGNOSIS — E538 Deficiency of other specified B group vitamins: Secondary | ICD-10-CM | POA: Diagnosis not present

## 2017-04-21 DIAGNOSIS — R161 Splenomegaly, not elsewhere classified: Secondary | ICD-10-CM | POA: Diagnosis present

## 2017-04-21 DIAGNOSIS — K760 Fatty (change of) liver, not elsewhere classified: Secondary | ICD-10-CM | POA: Insufficient documentation

## 2017-04-21 DIAGNOSIS — D72820 Lymphocytosis (symptomatic): Secondary | ICD-10-CM

## 2017-04-21 DIAGNOSIS — D696 Thrombocytopenia, unspecified: Secondary | ICD-10-CM

## 2017-04-21 LAB — GLUCOSE, CAPILLARY: Glucose-Capillary: 84 mg/dL (ref 65–99)

## 2017-04-21 MED ORDER — CYANOCOBALAMIN 1000 MCG/ML IJ SOLN
1000.0000 ug | Freq: Once | INTRAMUSCULAR | Status: AC
  Administered 2017-04-21: 1000 ug via INTRAMUSCULAR

## 2017-04-21 MED ORDER — FLUDEOXYGLUCOSE F - 18 (FDG) INJECTION
10.9000 | Freq: Once | INTRAVENOUS | Status: AC
  Administered 2017-04-21: 11.2 via INTRAVENOUS

## 2017-04-28 ENCOUNTER — Other Ambulatory Visit: Payer: Self-pay | Admitting: Oncology

## 2017-04-28 DIAGNOSIS — D72820 Lymphocytosis (symptomatic): Secondary | ICD-10-CM

## 2017-04-28 DIAGNOSIS — D696 Thrombocytopenia, unspecified: Secondary | ICD-10-CM

## 2017-04-28 DIAGNOSIS — C8510 Unspecified B-cell lymphoma, unspecified site: Secondary | ICD-10-CM

## 2017-05-12 ENCOUNTER — Other Ambulatory Visit: Payer: Self-pay | Admitting: Radiology

## 2017-05-13 ENCOUNTER — Inpatient Hospital Stay (HOSPITAL_COMMUNITY): Admission: RE | Admit: 2017-05-13 | Payer: Self-pay | Source: Ambulatory Visit

## 2017-05-13 ENCOUNTER — Ambulatory Visit
Admission: RE | Admit: 2017-05-13 | Discharge: 2017-05-13 | Disposition: A | Discharge status: Home / Self Care | Payer: Medicare PPO | Source: Ambulatory Visit | Attending: Oncology | Admitting: Oncology

## 2017-05-13 ENCOUNTER — Other Ambulatory Visit (HOSPITAL_COMMUNITY)
Admission: RE | Admit: 2017-05-13 | Disposition: A | Discharge status: Home / Self Care | Payer: Medicare PPO | Source: Ambulatory Visit | Attending: Oncology | Admitting: Oncology

## 2017-05-13 DIAGNOSIS — Z888 Allergy status to other drugs, medicaments and biological substances status: Secondary | ICD-10-CM | POA: Insufficient documentation

## 2017-05-13 DIAGNOSIS — K2270 Barrett's esophagus without dysplasia: Secondary | ICD-10-CM | POA: Diagnosis not present

## 2017-05-13 DIAGNOSIS — I712 Thoracic aortic aneurysm, without rupture: Secondary | ICD-10-CM | POA: Insufficient documentation

## 2017-05-13 DIAGNOSIS — D72820 Lymphocytosis (symptomatic): Secondary | ICD-10-CM | POA: Insufficient documentation

## 2017-05-13 DIAGNOSIS — D696 Thrombocytopenia, unspecified: Secondary | ICD-10-CM | POA: Insufficient documentation

## 2017-05-13 DIAGNOSIS — Z880 Allergy status to penicillin: Secondary | ICD-10-CM | POA: Insufficient documentation

## 2017-05-13 DIAGNOSIS — K4090 Unilateral inguinal hernia, without obstruction or gangrene, not specified as recurrent: Secondary | ICD-10-CM | POA: Insufficient documentation

## 2017-05-13 DIAGNOSIS — G4733 Obstructive sleep apnea (adult) (pediatric): Secondary | ICD-10-CM | POA: Diagnosis not present

## 2017-05-13 DIAGNOSIS — C8590 Non-Hodgkin lymphoma, unspecified, unspecified site: Secondary | ICD-10-CM | POA: Diagnosis not present

## 2017-05-13 DIAGNOSIS — E1151 Type 2 diabetes mellitus with diabetic peripheral angiopathy without gangrene: Secondary | ICD-10-CM | POA: Diagnosis not present

## 2017-05-13 DIAGNOSIS — Z9049 Acquired absence of other specified parts of digestive tract: Secondary | ICD-10-CM | POA: Insufficient documentation

## 2017-05-13 DIAGNOSIS — F17210 Nicotine dependence, cigarettes, uncomplicated: Secondary | ICD-10-CM | POA: Insufficient documentation

## 2017-05-13 DIAGNOSIS — I4891 Unspecified atrial fibrillation: Secondary | ICD-10-CM | POA: Insufficient documentation

## 2017-05-13 DIAGNOSIS — M069 Rheumatoid arthritis, unspecified: Secondary | ICD-10-CM | POA: Diagnosis not present

## 2017-05-13 DIAGNOSIS — I110 Hypertensive heart disease with heart failure: Secondary | ICD-10-CM | POA: Diagnosis not present

## 2017-05-13 DIAGNOSIS — D6861 Antiphospholipid syndrome: Secondary | ICD-10-CM | POA: Insufficient documentation

## 2017-05-13 DIAGNOSIS — I5022 Chronic systolic (congestive) heart failure: Secondary | ICD-10-CM | POA: Insufficient documentation

## 2017-05-13 DIAGNOSIS — I700 Atherosclerosis of aorta: Secondary | ICD-10-CM | POA: Diagnosis not present

## 2017-05-13 DIAGNOSIS — N281 Cyst of kidney, acquired: Secondary | ICD-10-CM | POA: Insufficient documentation

## 2017-05-13 DIAGNOSIS — I714 Abdominal aortic aneurysm, without rupture: Secondary | ICD-10-CM | POA: Insufficient documentation

## 2017-05-13 DIAGNOSIS — E785 Hyperlipidemia, unspecified: Secondary | ICD-10-CM | POA: Insufficient documentation

## 2017-05-13 DIAGNOSIS — Z7982 Long term (current) use of aspirin: Secondary | ICD-10-CM | POA: Insufficient documentation

## 2017-05-13 DIAGNOSIS — E538 Deficiency of other specified B group vitamins: Secondary | ICD-10-CM | POA: Insufficient documentation

## 2017-05-13 DIAGNOSIS — K219 Gastro-esophageal reflux disease without esophagitis: Secondary | ICD-10-CM | POA: Insufficient documentation

## 2017-05-13 DIAGNOSIS — Z7901 Long term (current) use of anticoagulants: Secondary | ICD-10-CM | POA: Diagnosis not present

## 2017-05-13 DIAGNOSIS — Z7951 Long term (current) use of inhaled steroids: Secondary | ICD-10-CM | POA: Insufficient documentation

## 2017-05-13 DIAGNOSIS — I25810 Atherosclerosis of coronary artery bypass graft(s) without angina pectoris: Secondary | ICD-10-CM | POA: Diagnosis not present

## 2017-05-13 DIAGNOSIS — I252 Old myocardial infarction: Secondary | ICD-10-CM | POA: Diagnosis not present

## 2017-05-13 DIAGNOSIS — C8510 Unspecified B-cell lymphoma, unspecified site: Secondary | ICD-10-CM

## 2017-05-13 DIAGNOSIS — K5730 Diverticulosis of large intestine without perforation or abscess without bleeding: Secondary | ICD-10-CM | POA: Insufficient documentation

## 2017-05-13 DIAGNOSIS — K449 Diaphragmatic hernia without obstruction or gangrene: Secondary | ICD-10-CM | POA: Insufficient documentation

## 2017-05-13 DIAGNOSIS — Z8619 Personal history of other infectious and parasitic diseases: Secondary | ICD-10-CM | POA: Insufficient documentation

## 2017-05-13 DIAGNOSIS — Z79899 Other long term (current) drug therapy: Secondary | ICD-10-CM | POA: Insufficient documentation

## 2017-05-13 DIAGNOSIS — Z955 Presence of coronary angioplasty implant and graft: Secondary | ICD-10-CM | POA: Insufficient documentation

## 2017-05-13 DIAGNOSIS — K760 Fatty (change of) liver, not elsewhere classified: Secondary | ICD-10-CM | POA: Insufficient documentation

## 2017-05-13 DIAGNOSIS — D72810 Lymphocytopenia: Secondary | ICD-10-CM | POA: Insufficient documentation

## 2017-05-13 LAB — CBC WITH DIFFERENTIAL/PLATELET
Basophils Absolute: 0.1 10*3/uL (ref 0–0.1)
Basophils Relative: 1 %
Eosinophils Absolute: 0.1 10*3/uL (ref 0–0.7)
Eosinophils Relative: 1 %
HEMATOCRIT: 43.2 % (ref 40.0–52.0)
HEMOGLOBIN: 14.3 g/dL (ref 13.0–18.0)
LYMPHS PCT: 72 %
Lymphs Abs: 7.4 10*3/uL — ABNORMAL HIGH (ref 1.0–3.6)
MCH: 27.2 pg (ref 26.0–34.0)
MCHC: 33.1 g/dL (ref 32.0–36.0)
MCV: 82.2 fL (ref 80.0–100.0)
MONO ABS: 0.3 10*3/uL (ref 0.2–1.0)
Monocytes Relative: 3 %
NEUTROS PCT: 23 %
Neutro Abs: 2.3 10*3/uL (ref 1.4–6.5)
Platelets: 98 10*3/uL — ABNORMAL LOW (ref 150–440)
RBC: 5.25 MIL/uL (ref 4.40–5.90)
RDW: 17 % — ABNORMAL HIGH (ref 11.5–14.5)
WBC: 10.2 10*3/uL (ref 3.8–10.6)

## 2017-05-13 LAB — PROTIME-INR
INR: 2.45
Prothrombin Time: 26.4 seconds — ABNORMAL HIGH (ref 11.4–15.2)

## 2017-05-13 MED ORDER — FENTANYL CITRATE (PF) 100 MCG/2ML IJ SOLN
INTRAMUSCULAR | Status: AC | PRN
  Administered 2017-05-13 (×3): 50 ug via INTRAVENOUS

## 2017-05-13 MED ORDER — HEPARIN SOD (PORK) LOCK FLUSH 100 UNIT/ML IV SOLN
INTRAVENOUS | Status: AC
  Filled 2017-05-13: qty 5

## 2017-05-13 MED ORDER — FENTANYL CITRATE (PF) 100 MCG/2ML IJ SOLN
INTRAMUSCULAR | Status: AC
  Filled 2017-05-13: qty 2

## 2017-05-13 MED ORDER — SODIUM CHLORIDE 0.9 % IV SOLN
INTRAVENOUS | Status: DC
  Administered 2017-05-13: 08:00:00 via INTRAVENOUS

## 2017-05-13 MED ORDER — MIDAZOLAM HCL 5 MG/5ML IJ SOLN
INTRAMUSCULAR | Status: AC | PRN
  Administered 2017-05-13 (×2): 1 mg via INTRAVENOUS

## 2017-05-13 MED ORDER — MIDAZOLAM HCL 5 MG/5ML IJ SOLN
INTRAMUSCULAR | Status: AC
  Filled 2017-05-13: qty 5

## 2017-05-13 NOTE — Sedation Documentation (Signed)
Total sedation: Versed 2 mg IV, Fentanyl 150 mcg IV. Pt. Tolerated procedure well. Stable for tx.

## 2017-05-13 NOTE — Discharge Instructions (Signed)
Bone Marrow Aspiration and Bone Marrow Biopsy, Adult, Care After °This sheet gives you information about how to care for yourself after your procedure. Your health care provider may also give you more specific instructions. If you have problems or questions, contact your health care provider. °What can I expect after the procedure? °After the procedure, it is common to have: °· Mild pain and tenderness. °· Swelling. °· Bruising. ° °Follow these instructions at home: °· Take over-the-counter or prescription medicines only as told by your health care provider. °· Do not take baths, swim, or use a hot tub until your health care provider approves. Ask if you can take a shower or have a sponge bath. °· Follow instructions from your health care provider about how to take care of the puncture site. Make sure you: °? Wash your hands with soap and water before you change your bandage (dressing). If soap and water are not available, use hand sanitizer. °? Change your dressing as told by your health care provider. °· Check your puncture site every day for signs of infection. Check for: °? More redness, swelling, or pain. °? More fluid or blood. °? Warmth. °? Pus or a bad smell. °· Return to your normal activities as told by your health care provider. Ask your health care provider what activities are safe for you. °· Do not drive for 24 hours if you were given a medicine to help you relax (sedative). °· Keep all follow-up visits as told by your health care provider. This is important. °Contact a health care provider if: °· You have more redness, swelling, or pain around the puncture site. °· You have more fluid or blood coming from the puncture site. °· Your puncture site feels warm to the touch. °· You have pus or a bad smell coming from the puncture site. °· You have a fever. °· Your pain is not controlled with medicine. °This information is not intended to replace advice given to you by your health care provider. Make sure  you discuss any questions you have with your health care provider. °Document Released: 07/13/2004 Document Revised: 07/14/2015 Document Reviewed: 06/07/2015 °Elsevier Interactive Patient Education © 2018 [employer] ° °

## 2017-05-13 NOTE — Procedures (Signed)
Interventional Radiology Procedure Note  Procedure: CT guided bone marrow aspiration and biopsy  Complications: None  EBL: < 10 mL  Findings: Aspirate and core biopsy performed of bone marrow in right iliac bone.  Plan: Bedrest supine x 1 hrs  Carmeline Kowal T. Katanya Schlie, M.D Pager:  [phone]   

## 2017-05-13 NOTE — H&P (Signed)
Chief Complaint: Patient was seen in consultation today for bone marrow biopsy at the request of Yu,Zhou  Referring Physician(s): Yu,Zhou  Patient Status: [facility] - Out-pt  History of Present Illness: Martin Adkins is a 72 y.o. male with lymphocytosis, thrombocytopenia and splenomegaly with clinical suspicion of lymphoma/lymphoproliferative disorder here for bone marrow biopsy. 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  Past Medical History:  Diagnosis Date  . AAA (abdominal aortic aneurysm) without rupture ([place]) 04/05/2014  . AAA (abdominal aortic aneurysm) without rupture ([place]) 04/05/2014  . Abdominal aortic aneurysm without rupture ([place])   . Acute non-ST elevation myocardial infarction (NSTEMI) ([place]) 08/15/2014  . 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/28/2014  . APS (antiphospholipid syndrome) ([place])   . Arteriosclerosis of coronary artery 04/05/2014   Overview:  Sp cabg with lima to lad svg to d1 om1 rca 2004 with occluded svg to rca and d1 and pci stetn of om1 graft 2015   . Arthritis   . B12 deficiency 03/21/2017  . Barrett's esophagus   . Benign essential HTN 05/02/2014  . BPH (benign prostatic hyperplasia)   . CAD (coronary artery disease)   . CHF (congestive heart failure) ([place])   . Chronic systolic heart failure ([place]) 04/19/2014   Overview:  With segmental LV systolic dysfunction ejection fraction of 35%   . Diabetes ([place])   . DVT (deep venous thrombosis) ([place])   . Dysrhythmia    ATRIAL FIB.  Marland Kitchen GERD (gastroesophageal reflux disease)   . History of hiatal hernia   . History of shingles 2013  . Hyperlipemia   . Hyperplastic polyps of stomach   . Hypertension   . Myocardial infarction ([place]) 07/30/2014  . NSTEMI (non-ST elevated myocardial infarction) ([place])   . OSA (obstructive sleep apnea)   . Osteoarthritis   . PVD (peripheral vascular disease) ([place])   . RA (rheumatoid arthritis) ([place])   . SOB (shortness of breath)     Past  Surgical History:  Procedure Laterality Date  . ABDOMINAL AORTA STENT    . CHOLECYSTECTOMY    . COLONOSCOPY  11/24/2009  . CORONARY ANGIOPLASTY WITH STENT PLACEMENT  2015  . CORONARY ARTERY BYPASS GRAFT    . ESOPHAGOGASTRODUODENOSCOPY  05/26/02  03/23/12  . ESOPHAGOGASTRODUODENOSCOPY (EGD) WITH PROPOFOL N/A 10/28/2016   Procedure: ESOPHAGOGASTRODUODENOSCOPY (EGD) WITH PROPOFOL;  Surgeon: Manya Silvas, MD;  Location: [facility] ENDOSCOPY;  Service: Endoscopy;  Laterality: N/A;  . PERIPHERAL VASCULAR CATHETERIZATION N/A 09/06/2014   Procedure: IVC Filter Removal;  Surgeon: Katha Cabal, MD;  Location: [facility] CV LAB;  Service: Cardiovascular;  Laterality: N/A;  . TRANSURETHRAL RESECTION OF PROSTATE N/A 02/20/2015   Procedure: TRANSURETHRAL RESECTION OF THE PROSTATE (TURP);  Surgeon: Hollice Espy, MD;  Location: [facility] ORS;  Service: Urology;  Laterality: N/A;    Allergies: Ace inhibitors; Pravastatin; Rosuvastatin; Simvastatin; Amoxicillin; Niacin; and Penicillins  Medications: Prior to Admission medications   Medication Sig Start Date End Date Taking? Authorizing Provider  amLODipine (NORVASC) 10 MG tablet Take 10 mg by mouth daily.  11/19/14  Yes [provider]  aspirin EC 81 MG tablet Take 81 mg by mouth daily.   Yes [provider]  atorvastatin (LIPITOR) 80 MG tablet TAKE 1 TABLET (80 MG TOTAL) BY MOUTH ONCE DAILY. 08/26/14  Yes [provider]  fluticasone (FLONASE) 50 MCG/ACT nasal spray Place 2 sprays into both nostrils daily.  05/24/14  Yes [provider]  furosemide (LASIX) 20 MG tablet Take  20 mg by mouth daily. 02/14/17  Yes [provider]  gabapentin (NEURONTIN) 300 MG capsule Take 300 mg by mouth 2 (two) times daily.   Yes [provider]  hydroxychloroquine (PLAQUENIL) 200 MG tablet Take 1 tablet by mouth daily. 06/27/16  Yes [provider]  isosorbide mononitrate (IMDUR) 30 MG 24 hr tablet Take 30 mg by  mouth daily.  07/25/14  Yes [provider]  losartan (COZAAR) 100 MG tablet 100 mg daily.  02/12/17  Yes [provider]  LUBRICANT EYE DROPS 0.4-0.3 % SOLN Apply 1 drop to eye daily.  08/24/14  Yes [provider]  metoprolol succinate (TOPROL-XL) 100 MG 24 hr tablet Take 100 mg by mouth daily.  05/17/14  Yes [provider]  pantoprazole (PROTONIX) 40 MG tablet Take 40 mg by mouth daily.  07/07/14  Yes [provider]  potassium chloride SA (K-DUR,KLOR-CON) 20 MEQ tablet Take 20 mEq by mouth daily.  07/25/14  Yes [provider]  vitamin B-12 (CYANOCOBALAMIN) 100 MCG tablet Take 1 tablet (100 mcg total) by mouth daily. 04/16/17  Yes Earlie Server, MD  warfarin (COUMADIN) 1 MG tablet Take by mouth daily. Patient takes once a week with 25m tablet for a total of 6 mg per day 07/27/14  Yes [provider]  warfarin (COUMADIN) 5 MG tablet TAKE 1 TABLET (5 MG TOTAL) BY MOUTH ONCE DAILY. 09/14/14  Yes [provider]  albuterol (PROVENTIL HFA;VENTOLIN HFA) 108 (90 Base) MCG/ACT inhaler Inhale 2 puffs into the lungs every 6 (six) hours as needed for wheezing or shortness of breath. 02/26/15   GNance Pear MD  chlorthalidone (HYGROTON) 25 MG tablet Take 25 mg by mouth daily.  07/28/14   [provider]  docusate sodium (COLACE) 100 MG capsule Take 1 capsule (100 mg total) by mouth 2 (two) times daily. Patient not taking: Reported on 04/09/2017 02/21/15   BHollice Espy MD  valsartan (DIOVAN) 160 MG tablet Take 160 mg by mouth 2 (two) times daily.     [provider]     Family History  Problem Relation Age of Onset  . Cancer Mother 745      breast ca  . Diabetes Mother   . Coronary artery disease Father   . Heart attack Father   . Prostate cancer Brother   . Bladder Cancer Neg Hx   . Kidney cancer Neg Hx     Social History   Socioeconomic History  . Marital status: Legally Separated    Spouse name: Not on file  .  Number of children: Not on file  . Years of education: Not on file  . Highest education level: Not on file  Occupational History  . Not on file  Social Needs  . Financial resource strain: Not on file  . Food insecurity:    Worry: Not on file    Inability: Not on file  . Transportation needs:    Medical: Not on file    Non-medical: Not on file  Tobacco Use  . Smoking status: Current Some Day Smoker    Packs/day: 0.25    Years: 20.00    Pack years: 5.00    Types: Cigarettes  . Smokeless tobacco: Never Used  . Tobacco comment: per pt nicotine patch number72m-smokes 1-3 cig/per d now  Substance and Sexual Activity  . Alcohol use: Yes    Alcohol/week: 0.0 oz    Comment: social on weekends  . Drug use: No  .  Sexual activity: Never  Lifestyle  . Physical activity:    Days per week: Not on file    Minutes per session: Not on file  . Stress: Not on file  Relationships  . Social connections:    Talks on phone: Not on file    Gets together: Not on file    Attends religious service: Not on file    Active member of club or organization: Not on file    Attends meetings of clubs or organizations: Not on file    Relationship status: Not on file  Other Topics Concern  . Not on file  Social History Narrative  . Not on file    ECOG Status: 1 - Symptomatic but completely ambulatory  Review of Systems: A 12 point ROS discussed and pertinent positives are indicated in the HPI above.  All other systems are negative.  Review of Systems  Constitutional: Positive for fatigue.  HENT: Negative.   Respiratory: Negative.   Cardiovascular: Negative.   Gastrointestinal: Negative.   Genitourinary: Negative.   Musculoskeletal: Negative.   Neurological: Negative.   Hematological: Negative.     Vital Signs: BP (!) 146/84   Pulse 64   Temp 98.2 F (36.8 C) (Oral)   Resp 15   Ht 6' (1.829 m)   Wt 212 lb (96.2 kg)   SpO2 100%   BMI 28.75 kg/m   Physical Exam  Constitutional: He  is oriented to person, place, and time. He appears well-developed and well-nourished. No distress.  HENT:  Head: Normocephalic and atraumatic.  Neck: Neck supple. No JVD present. No tracheal deviation present. No thyromegaly present.  Cardiovascular: Normal rate, regular rhythm and normal heart sounds. Exam reveals no gallop and no friction rub.  No murmur heard. Pulmonary/Chest: Effort normal and breath sounds normal. No stridor. No respiratory distress. He has no wheezes. He has no rales.  Abdominal: Soft. Bowel sounds are normal. He exhibits no distension and no mass. There is no tenderness. There is no rebound and no guarding.  Musculoskeletal: He exhibits no edema.  Neurological: He is alert and oriented to person, place, and time.  Skin: Skin is warm and dry. He is not diaphoretic.  Vitals reviewed.   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 Splenomegaly. EXAM: NUCLEAR MEDICINE PET SKULL BASE TO THIGH TECHNIQUE: 11.2 mCi F-18 FDG was injected intravenously. Full-ring PET imaging was performed from the skull base to thigh after the radiotracer. CT data was obtained and used for attenuation correction and anatomic localization. Fasting blood glucose: 84 mg/dl COMPARISON:  11/19/2016 chest CT. 03/28/2017 abdominal sonogram. 07/09/2011 CT angiogram of the abdomen and pelvis. FINDINGS: Mediastinal blood pool activity: SUV max 2.4 NECK: No hypermetabolic lymph nodes in the neck. Incidental CT findings: none CHEST: No enlarged or hypermetabolic axillary, mediastinal or hilar lymph nodes. No foci of pulmonary hypermetabolism. Incidental CT findings: No acute consolidative airspace disease, lung masses or significant pulmonary nodules. Three-vessel coronary atherosclerosis status post CABG. Atherosclerotic thoracic aorta with stable 4.5 cm  ascending thoracic aortic aneurysm. ABDOMEN/PELVIS: Moderate splenomegaly with craniocaudal splenic length 18.7 cm. Uniform hypermetabolism in the spleen with max SUV 5.0 in the spleen compared to max SUV 3.7 in the liver. No splenic mass. No abnormal hypermetabolic activity within the liver, pancreas or adrenal glands. No hypermetabolic lymph nodes in the abdomen or pelvis. Incidental CT findings: Mild diffuse  hepatic steatosis. Cholecystectomy. Mild sigmoid diverticulosis. Infrarenal 5.0 cm abdominal aortic aneurysm status post stent graft repair. Please note that right common iliac stent limb appears to terminate within the lower abdominal aortic aneurysm sac and not within the ectatic right common iliac artery. Small to moderate fat containing right inguinal hernia. Simple 2.8 cm anterior lower left renal cyst. SKELETON: No focal hypermetabolic activity to suggest skeletal metastasis. Incidental CT findings: Intact sternotomy wires. IMPRESSION: 1. Moderate splenomegaly with uniform splenic hypermetabolism, compatible with the provided history of lymphoproliferative disease. No additional hypermetabolic sites of lymphoproliferative disease. Specifically, no hypermetabolic lymphadenopathy. 2. Aortic Atherosclerosis (ICD10-I70.0). Infrarenal 5.0 cm abdominal aortic aneurysm status post stent graft repair, noting that the right common iliac stent limb appears to terminate within the lower abdominal aortic aneurysm sac. Consultation with the performing interventional specialist advised. 3. Chronic findings include: Diffuse hepatic steatosis. Mild sigmoid diverticulosis. Electronically Signed   By: Ilona Sorrel M.D.   On: 04/21/2017 16:09    Labs:  CBC: Recent Labs    08/13/16 0325 03/21/17 1255 04/09/17 1155 05/13/17 0756  WBC 13.4* 10.0 8.8 10.2  HGB 15.2 14.9 14.4 14.3  HCT 46.3 43.8 43.1 43.2  PLT 145* 121* 119* 98*    COAGS: Recent Labs    05/13/17 0756  INR 2.45    BMP: Recent Labs     08/13/16 0325 11/19/16 1126 03/21/17 1243  NA 135  --  136  K 3.8  --  4.5  CL 101  --  104  CO2 22  --  24  GLUCOSE 127*  --  112*  BUN 34*  --  18  CALCIUM 9.0  --  9.0  CREATININE 1.99* 1.00 1.00  GFRNONAA 33*  --  >60  GFRAA 38*  --  >60    LIVER FUNCTION TESTS: Recent Labs    03/21/17 1243 04/09/17 1155  BILITOT 1.5* 1.7*  AST 44*  --   ALT 33  --   ALKPHOS 88  --   PROT 8.0  --   ALBUMIN 4.3  --     TUMOR MARKERS: No results for input(s): AFPTM, CEA, CA199, CHROMGRNA in the last 8760 hours.  Assessment and Plan:  For CT guided bone marrow biopsy. Risks and benefits discussed with the patient including, but not limited to bleeding, infection, damage to adjacent structures or low yield requiring additional tests. All of the patient's questions were answered, patient is agreeable to proceed. Consent signed and in chart.  Thank you for this interesting consult.  I greatly enjoyed meeting [facility] and look forward to participating in their care.  A copy of this report was sent to the requesting provider on this date.  Electronically Signed: Azzie Roup, MD 05/13/2017, 9:31 AM   I spent a total of 30 Minutes  in face to face in clinical consultation, greater than 50% of which was counseling/coordinating care for bone marrow biopsy.

## 2017-05-13 NOTE — Progress Notes (Signed)
Patient clinically stable post bone marrow biopsy, dressing posterior lumbar c/d/i. Vitals stable. Discharge instructions given pre op with son present with questions answered. Denies complaints at this time. Sinus rhythm per monitor.

## 2017-05-14 ENCOUNTER — Inpatient Hospital Stay: Payer: Medicare PPO

## 2017-05-14 ENCOUNTER — Inpatient Hospital Stay: Payer: Medicare PPO | Admitting: Oncology

## 2017-05-22 ENCOUNTER — Encounter (HOSPITAL_COMMUNITY): Payer: Self-pay | Admitting: Oncology

## 2017-05-23 ENCOUNTER — Inpatient Hospital Stay: Payer: Medicare PPO

## 2017-05-23 ENCOUNTER — Inpatient Hospital Stay: Payer: Medicare PPO | Attending: Oncology | Admitting: Oncology

## 2017-05-23 ENCOUNTER — Encounter: Payer: Self-pay | Admitting: Oncology

## 2017-05-23 VITALS — BP 103/72 | HR 80 | Temp 98.1°F | Resp 18 | Ht 72.0 in | Wt 207.2 lb

## 2017-05-23 DIAGNOSIS — R634 Abnormal weight loss: Secondary | ICD-10-CM

## 2017-05-23 DIAGNOSIS — I2510 Atherosclerotic heart disease of native coronary artery without angina pectoris: Secondary | ICD-10-CM

## 2017-05-23 DIAGNOSIS — I7410 Embolism and thrombosis of unspecified parts of aorta: Secondary | ICD-10-CM | POA: Diagnosis not present

## 2017-05-23 DIAGNOSIS — Z5111 Encounter for antineoplastic chemotherapy: Secondary | ICD-10-CM | POA: Diagnosis not present

## 2017-05-23 DIAGNOSIS — B0229 Other postherpetic nervous system involvement: Secondary | ICD-10-CM | POA: Diagnosis not present

## 2017-05-23 DIAGNOSIS — E538 Deficiency of other specified B group vitamins: Secondary | ICD-10-CM

## 2017-05-23 DIAGNOSIS — M069 Rheumatoid arthritis, unspecified: Secondary | ICD-10-CM

## 2017-05-23 DIAGNOSIS — C8510 Unspecified B-cell lymphoma, unspecified site: Secondary | ICD-10-CM

## 2017-05-23 DIAGNOSIS — Z7189 Other specified counseling: Secondary | ICD-10-CM

## 2017-05-23 DIAGNOSIS — R161 Splenomegaly, not elsewhere classified: Secondary | ICD-10-CM | POA: Diagnosis not present

## 2017-05-23 DIAGNOSIS — C9190 Lymphoid leukemia, unspecified not having achieved remission: Secondary | ICD-10-CM | POA: Diagnosis not present

## 2017-05-23 DIAGNOSIS — I5022 Chronic systolic (congestive) heart failure: Secondary | ICD-10-CM

## 2017-05-23 DIAGNOSIS — K2270 Barrett's esophagus without dysplasia: Secondary | ICD-10-CM | POA: Diagnosis not present

## 2017-05-23 DIAGNOSIS — I4891 Unspecified atrial fibrillation: Secondary | ICD-10-CM

## 2017-05-23 DIAGNOSIS — F17210 Nicotine dependence, cigarettes, uncomplicated: Secondary | ICD-10-CM

## 2017-05-23 DIAGNOSIS — I700 Atherosclerosis of aorta: Secondary | ICD-10-CM | POA: Diagnosis not present

## 2017-05-23 DIAGNOSIS — E1151 Type 2 diabetes mellitus with diabetic peripheral angiopathy without gangrene: Secondary | ICD-10-CM

## 2017-05-23 DIAGNOSIS — D6861 Antiphospholipid syndrome: Secondary | ICD-10-CM

## 2017-05-23 DIAGNOSIS — I252 Old myocardial infarction: Secondary | ICD-10-CM

## 2017-05-23 DIAGNOSIS — J449 Chronic obstructive pulmonary disease, unspecified: Secondary | ICD-10-CM | POA: Diagnosis not present

## 2017-05-23 DIAGNOSIS — R630 Anorexia: Secondary | ICD-10-CM | POA: Diagnosis not present

## 2017-05-23 DIAGNOSIS — N400 Enlarged prostate without lower urinary tract symptoms: Secondary | ICD-10-CM

## 2017-05-23 DIAGNOSIS — T8090XA Unspecified complication following infusion and therapeutic injection, initial encounter: Secondary | ICD-10-CM | POA: Diagnosis not present

## 2017-05-23 DIAGNOSIS — K760 Fatty (change of) liver, not elsewhere classified: Secondary | ICD-10-CM

## 2017-05-23 DIAGNOSIS — Z79899 Other long term (current) drug therapy: Secondary | ICD-10-CM

## 2017-05-23 DIAGNOSIS — E785 Hyperlipidemia, unspecified: Secondary | ICD-10-CM

## 2017-05-23 DIAGNOSIS — R5383 Other fatigue: Secondary | ICD-10-CM

## 2017-05-23 DIAGNOSIS — C9110 Chronic lymphocytic leukemia of B-cell type not having achieved remission: Secondary | ICD-10-CM

## 2017-05-23 DIAGNOSIS — Z7982 Long term (current) use of aspirin: Secondary | ICD-10-CM

## 2017-05-23 DIAGNOSIS — D696 Thrombocytopenia, unspecified: Secondary | ICD-10-CM | POA: Diagnosis not present

## 2017-05-23 DIAGNOSIS — I714 Abdominal aortic aneurysm, without rupture: Secondary | ICD-10-CM

## 2017-05-23 DIAGNOSIS — Z7901 Long term (current) use of anticoagulants: Secondary | ICD-10-CM

## 2017-05-23 DIAGNOSIS — D3500 Benign neoplasm of unspecified adrenal gland: Secondary | ICD-10-CM | POA: Diagnosis not present

## 2017-05-23 DIAGNOSIS — K449 Diaphragmatic hernia without obstruction or gangrene: Secondary | ICD-10-CM

## 2017-05-23 DIAGNOSIS — G4733 Obstructive sleep apnea (adult) (pediatric): Secondary | ICD-10-CM

## 2017-05-23 LAB — CBC WITH DIFFERENTIAL/PLATELET
Basophils Absolute: 0 10*3/uL (ref 0–0.1)
Basophils Relative: 0 %
EOS PCT: 1 %
Eosinophils Absolute: 0 10*3/uL (ref 0–0.7)
HEMATOCRIT: 40.3 % (ref 40.0–52.0)
Hemoglobin: 13.6 g/dL (ref 13.0–18.0)
LYMPHS ABS: 5.4 10*3/uL — AB (ref 1.0–3.6)
LYMPHS PCT: 65 %
MCH: 27.7 pg (ref 26.0–34.0)
MCHC: 33.7 g/dL (ref 32.0–36.0)
MCV: 82.1 fL (ref 80.0–100.0)
MONO ABS: 0.3 10*3/uL (ref 0.2–1.0)
MONOS PCT: 3 %
NEUTROS ABS: 2.6 10*3/uL (ref 1.4–6.5)
Neutrophils Relative %: 31 %
PLATELETS: 130 10*3/uL — AB (ref 150–440)
RBC: 4.91 MIL/uL (ref 4.40–5.90)
RDW: 16.6 % — AB (ref 11.5–14.5)
WBC: 8.4 10*3/uL (ref 3.8–10.6)

## 2017-05-23 LAB — COMPREHENSIVE METABOLIC PANEL
ALBUMIN: 3.9 g/dL (ref 3.5–5.0)
ALT: 27 U/L (ref 17–63)
ANION GAP: 11 (ref 5–15)
AST: 42 U/L — AB (ref 15–41)
Alkaline Phosphatase: 100 U/L (ref 38–126)
BILIRUBIN TOTAL: 1.9 mg/dL — AB (ref 0.3–1.2)
BUN: 19 mg/dL (ref 6–20)
CO2: 24 mmol/L (ref 22–32)
Calcium: 8.9 mg/dL (ref 8.9–10.3)
Chloride: 104 mmol/L (ref 101–111)
Creatinine, Ser: 1.05 mg/dL (ref 0.61–1.24)
GFR calc Af Amer: >60 mL/min (ref >60)
GFR calc non Af Amer: >60 mL/min (ref >60)
Glucose, Bld: 131 mg/dL — ABNORMAL HIGH (ref 65–99)
POTASSIUM: 3.8 mmol/L (ref 3.5–5.1)
SODIUM: 139 mmol/L (ref 135–145)
Total Protein: 7.5 g/dL (ref 6.5–8.1)

## 2017-05-23 LAB — CHROMOSOME ANALYSIS, BONE MARROW

## 2017-05-23 LAB — TISSUE HYBRIDIZATION (BONE MARROW)-NCBH

## 2017-05-23 NOTE — Patient Instructions (Signed)
Obinutuzumab injection What is this medicine? OBINUTUZUMAB (OH bi nue TOOZ ue mab) is a monoclonal antibody. It is used to treat chronic lymphocytic leukemia (CLL) and a type of non-Hodgkin lymphoma (NHL), follicular lymphoma. This medicine may be used for other purposes; ask your health care provider or pharmacist if you have questions. COMMON BRAND NAME(S): GAZYVA What should I tell my health care provider before I take this medicine? They need to know if you have any of these conditions: -infection (especially a virus infection such as hepatitis B virus) -lung or breathing disease -heart disease -take medicines that treat or prevent blood clots -an unusual or allergic reaction to obinutuzumab, other medicines, foods, dyes, or preservatives -pregnant or trying to get pregnant -breast-feeding How should I use this medicine? This medicine is for infusion into a vein. It is given by a health care professional in a hospital or clinic setting. Talk to your pediatrician regarding the use of this medicine in children. Special care may be needed. Overdosage: If you think you have taken too much of this medicine contact a poison control center or emergency room at once. NOTE: This medicine is only for you. Do not share this medicine with others. What if I miss a dose? Keep appointments for follow-up doses as directed. It is important not to miss your dose. Call your doctor or health care professional if you are unable to keep an appointment. What may interact with this medicine? -live virus vaccines This list may not describe all possible interactions. Give your health care provider a list of all the medicines, herbs, non-prescription drugs, or dietary supplements you use. Also tell them if you smoke, drink alcohol, or use illegal drugs. Some items may interact with your medicine. What should I watch for while using this medicine? Report any side effects that you notice during your treatment right  away, such as changes in your breathing, fever, chills, dizziness or lightheadedness. These effects are more common with the first dose. Visit your prescriber or health care professional for checks on your progress. You will need to have regular blood work. Report any other side effects. The side effects of this medicine can continue after you finish your treatment. Continue your course of treatment even though you feel ill unless your doctor tells you to stop. Call your doctor or health care professional for advice if you get a fever, chills or sore throat, or other symptoms of a cold or flu. Do not treat yourself. This drug decreases your body's ability to fight infections. Try to avoid being around people who are sick. This medicine may increase your risk to bruise or bleed. Call your doctor or health care professional if you notice any unusual bleeding. What side effects may I notice from receiving this medicine? Side effects that you should report to your doctor or health care professional as soon as possible: -allergic reactions like skin rash, itching or hives, swelling of the face, lips, or tongue -breathing problems -changes in vision -chest pain or chest tightness -confusion -dizziness -loss of balance or coordination -low blood counts - this medicine may decrease the number of white blood cells, red blood cells and platelets. You may be at increased risk for infections and bleeding. -signs of decreased platelets or bleeding - bruising, pinpoint red spots on the skin, black, tarry stools, blood in the urine -signs of infection - fever or chills, cough, sore throat, pain or trouble passing urine -signs and symptoms of liver injury like dark yellow or   brown urine; general ill feeling or flu-like symptoms; light-colored stools; loss of appetite; nausea; right upper belly pain; unusually weak or tired; yellowing of the eyes or skin -trouble speaking or understanding -trouble  walking -vomiting Side effects that usually do not require medical attention (report to your doctor or health care professional if they continue or are bothersome): -constipation -joint pain -muscle pain This list may not describe all possible side effects. Call your doctor for medical advice about side effects. You may report side effects to FDA at 1-800-FDA-1088. Where should I keep my medicine? This drug is only given in a hospital or clinic and will not be stored at home. NOTE: This sheet is a summary. It may not cover all possible information. If you have questions about this medicine, talk to your doctor, pharmacist, or health care provider.  2018 Elsevier/Gold Standard (2015-01-26 08:54:03)  

## 2017-05-23 NOTE — Progress Notes (Signed)
Pt had cold sx and got Z-pack and felt some better but still with same sx of sob on exertion and no energy but that was some of reasons why he came here. And then had bm bx.

## 2017-05-24 ENCOUNTER — Encounter: Payer: Self-pay | Admitting: Oncology

## 2017-05-24 DIAGNOSIS — C9110 Chronic lymphocytic leukemia of B-cell type not having achieved remission: Secondary | ICD-10-CM | POA: Insufficient documentation

## 2017-05-24 DIAGNOSIS — Z7189 Other specified counseling: Secondary | ICD-10-CM | POA: Insufficient documentation

## 2017-05-24 HISTORY — DX: Chronic lymphocytic leukemia of B-cell type not having achieved remission: C91.10

## 2017-05-24 LAB — BETA 2 MICROGLOBULIN, SERUM: Beta-2 Microglobulin: 2.3 mg/L (ref 0.6–2.4)

## 2017-05-24 MED ORDER — ALLOPURINOL 300 MG PO TABS: 300.0000 mg | ORAL_TABLET | Freq: Every day | ORAL | 0 refills | Status: DC

## 2017-05-24 NOTE — Progress Notes (Signed)
Hematology/Oncology Follow up note [facility] Telephone:(336) [phone] Fax:([phone]) [phone]   Patient Care Team: [name], MD as PCP - General (Internal Medicine)  REFERRING PROVIDER: [name], MD  REASON FOR VISIT Follow up for chemotherapy management of CLL.  HISTORY OF PRESENTING ILLNESS:  Martin Adkins is a  72 y.o.  male with PMH listed below who was referred to me for evaluation of lymphocytosis.  Recent lab work on 03/05/2017 showed wbc 11.4, hb 14.6, platelet count 117,000, lymphocytosis 63.5%, neutrophil 22%, Report chronic fatigue, no weight loss, fever or chills.  He reports feeling chest "rattleness". Has for CT chest which showed acute finding, chronic finding includes  postsurgical changes consistent with coronary bypass grafting. One of the bypass grafts arising from the aorta shows apparent thrombosis and an area of distal aneurysmal dilatation which is also Thrombosed. Right adrenal adenoma stable in appearance.Mild scarring in the lung bases.Fatty liver.   Takes Aspirin 38m, coumadine, plavix. He is on anticoagulation for previous history of clots.  Denies any bleeding events. Use to drink hard liquir daily, quitted for 4-5 years. Current drinks wine on weekend.    Image Studies # 04/04/2017  [place] showed 1.  Splenomegaly.  No focal splenic lesions evident.2. Increased liver echogenicity, a finding most likely indicative of hepatic steatosis. While no focal liver lesions are evident on this study, it must be cautioned that the sensitivity of ultrasound for detection of focal liver lesions is diminished in this circumstance.3. Pancreas obscured by gas. Portions of inferior vena cava obscured by gas.4.  Gallbladder absent.5. Status post abdominal aortic aneurysm repair. No periaortic fluid.  # 04/21/2017 PET scan 1. Moderate splenomegaly with uniform splenic hypermetabolism,compatible with the provided history of  lymphoproliferative disease.No additional hypermetabolic sites of lymphoproliferative disease. Specifically, no hypermetabolic lymphadenopathy Hepatic steatosis, aortic atherosclerosis. Aneurysm.   INTERVAL HISTORY Martin JTRAMAYNE SEBESTAis a 72y.o. male who has above history reviewed by me today presents for follow up visit for management newly diagnosed CLL. During the interval he had bone marrow biopsy done and presents to discuss lab results.  He reports feeling very tried, do not have much appetite, and has weight loss of 4 pounds since 1 months ago.     Review of Systems  Constitutional: Positive for malaise/fatigue and weight loss. Negative for chills and fever.  HENT: Negative for congestion, ear discharge, ear pain, nosebleeds, sinus pain and sore throat.   Eyes: Negative for double vision, photophobia, pain, discharge and redness.  Respiratory: Positive for shortness of breath. Negative for cough, hemoptysis, sputum production and wheezing.   Cardiovascular: Negative for chest pain, palpitations, orthopnea, claudication and leg swelling.  Gastrointestinal: Negative for abdominal pain, blood in stool, constipation, diarrhea, heartburn, melena, nausea and vomiting.  Genitourinary: Negative for dysuria, flank pain, frequency, hematuria and urgency.  Musculoskeletal: Negative for back pain, myalgias and neck pain.  Skin: Negative for itching and rash.  Neurological: Negative for dizziness, tingling, tremors, speech change, focal weakness, weakness and headaches.  Endo/Heme/Allergies: Negative for environmental allergies. Does not bruise/bleed easily.  Psychiatric/Behavioral: Negative for depression, hallucinations and suicidal ideas. The patient is not nervous/anxious.     MEDICAL HISTORY:  Past Medical History:  Diagnosis Date  . AAA (abdominal aortic aneurysm) without rupture ([place] 04/05/2014  . AAA (abdominal aortic aneurysm) without rupture ([place] 04/05/2014  . Abdominal aortic  aneurysm without rupture ([place]   . Acute non-ST elevation myocardial infarction (NSTEMI) ([place] 08/15/2014  . 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/28/2014  .  APS (antiphospholipid syndrome) ([place])   . Arteriosclerosis of coronary artery 04/05/2014   Overview:  Sp cabg with lima to lad svg to d1 om1 rca 2004 with occluded svg to rca and d1 and pci stetn of om1 graft 2015   . Arthritis   . B12 deficiency 03/21/2017  . Barrett's esophagus   . Benign essential HTN 05/02/2014  . BPH (benign prostatic hyperplasia)   . CAD (coronary artery disease)   . CHF (congestive heart failure) ([place])   . Chronic systolic heart failure ([place]) 04/19/2014   Overview:  With segmental LV systolic dysfunction ejection fraction of 35%   . CLL (chronic lymphocytic leukemia) ([place])   . Diabetes ([place])   . DVT (deep venous thrombosis) ([place])   . Dysrhythmia    ATRIAL FIB.  Marland Kitchen GERD (gastroesophageal reflux disease)   . History of hiatal hernia   . History of shingles 2013  . Hyperlipemia   . Hyperplastic polyps of stomach   . Hypertension   . Myocardial infarction ([place]) 07/30/2014  . NSTEMI (non-ST elevated myocardial infarction) ([place])   . OSA (obstructive sleep apnea)   . Osteoarthritis   . PVD (peripheral vascular disease) ([place])   . RA (rheumatoid arthritis) ([place])   . SOB (shortness of breath)     SURGICAL HISTORY: Past Surgical History:  Procedure Laterality Date  . ABDOMINAL AORTA STENT    . CHOLECYSTECTOMY    . COLONOSCOPY  11/24/2009  . CORONARY ANGIOPLASTY WITH STENT PLACEMENT  2015  . CORONARY ARTERY BYPASS GRAFT    . ESOPHAGOGASTRODUODENOSCOPY  05/26/02  03/23/12  . ESOPHAGOGASTRODUODENOSCOPY (EGD) WITH PROPOFOL N/A 10/28/2016   Procedure: ESOPHAGOGASTRODUODENOSCOPY (EGD) WITH PROPOFOL;  Surgeon: Manya Silvas, MD;  Location: [facility] ENDOSCOPY;  Service: Endoscopy;  Laterality: N/A;  . PERIPHERAL VASCULAR CATHETERIZATION N/A 09/06/2014   Procedure: IVC Filter Removal;  Surgeon: Katha Cabal, MD;   Location: [facility] CV LAB;  Service: Cardiovascular;  Laterality: N/A;  . TRANSURETHRAL RESECTION OF PROSTATE N/A 02/20/2015   Procedure: TRANSURETHRAL RESECTION OF THE PROSTATE (TURP);  Surgeon: Hollice Espy, MD;  Location: [facility] ORS;  Service: Urology;  Laterality: N/A;    SOCIAL HISTORY: Social History   Socioeconomic History  . Marital status: Legally Separated    Spouse name: Not on file  . Number of children: Not on file  . Years of education: Not on file  . Highest education level: Not on file  Occupational History  . Not on file  Social Needs  . Financial resource strain: Not on file  . Food insecurity:    Worry: Not on file    Inability: Not on file  . Transportation needs:    Medical: Not on file    Non-medical: Not on file  Tobacco Use  . Smoking status: Current Some Day Smoker    Packs/day: 0.25    Years: 20.00    Pack years: 5.00    Types: Cigarettes  . Smokeless tobacco: Never Used  . Tobacco comment: per pt nicotine patch number53m -smokes 1-3 cig/per d now  Substance and Sexual Activity  . Alcohol use: Yes    Alcohol/week: 0.0 oz    Comment: social on weekends  . Drug use: No  . Sexual activity: Never  Lifestyle  . Physical activity:    Days per week: Not on file    Minutes per session: Not on file  . Stress: Not on file  Relationships  . Social connections:    Talks on phone: Not on  file    Gets together: Not on file    Attends religious service: Not on file    Active member of club or organization: Not on file    Attends meetings of clubs or organizations: Not on file    Relationship status: Not on file  . Intimate partner violence:    Fear of current or ex partner: Not on file    Emotionally abused: Not on file    Physically abused: Not on file    Forced sexual activity: Not on file  Other Topics Concern  . Not on file  Social History Narrative  . Not on file    FAMILY HISTORY: Family History  Problem Relation Age of Onset  .  Cancer Mother 64       breast ca  . Diabetes Mother   . Coronary artery disease Father   . Heart attack Father   . Prostate cancer Brother   . Bladder Cancer Neg Hx   . Kidney cancer Neg Hx     ALLERGIES:  is allergic to ace inhibitors; pravastatin; rosuvastatin; simvastatin; amoxicillin; niacin; and penicillins.  MEDICATIONS:  Current Outpatient Medications  Medication Sig Dispense Refill  . albuterol (PROVENTIL HFA;VENTOLIN HFA) 108 (90 Base) MCG/ACT inhaler Inhale 2 puffs into the lungs every 6 (six) hours as needed for wheezing or shortness of breath. 1 Inhaler 0  . amLODipine (NORVASC) 10 MG tablet Take 10 mg by mouth daily.     Marland Kitchen aspirin EC 81 MG tablet Take 81 mg by mouth daily.    Marland Kitchen atorvastatin (LIPITOR) 80 MG tablet TAKE 1 TABLET (80 MG TOTAL) BY MOUTH ONCE DAILY.  11  . fluticasone (FLONASE) 50 MCG/ACT nasal spray Place 2 sprays into both nostrils daily.     . furosemide (LASIX) 20 MG tablet Take 20 mg by mouth daily.  11  . gabapentin (NEURONTIN) 300 MG capsule Take 300 mg by mouth 2 (two) times daily.    . hydroxychloroquine (PLAQUENIL) 200 MG tablet Take 1 tablet by mouth daily.    . isosorbide mononitrate (IMDUR) 30 MG 24 hr tablet Take 30 mg by mouth daily.     Marland Kitchen losartan (COZAAR) 100 MG tablet 100 mg daily.     . metoprolol succinate (TOPROL-XL) 100 MG 24 hr tablet Take 100 mg by mouth daily.     . pantoprazole (PROTONIX) 40 MG tablet Take 40 mg by mouth daily.     . potassium chloride SA (K-DUR,KLOR-CON) 20 MEQ tablet Take 20 mEq by mouth daily.     . valsartan (DIOVAN) 160 MG tablet Take 160 mg by mouth 2 (two) times daily.     . vitamin B-12 (CYANOCOBALAMIN) 100 MCG tablet Take 1 tablet (100 mcg total) by mouth daily. 90 tablet 1  . warfarin (COUMADIN) 1 MG tablet Take by mouth daily. Patient takes once a week with 5m tablet for a total of 6 mg per day    . warfarin (COUMADIN) 5 MG tablet TAKE 1 TABLET (5 MG TOTAL) BY MOUTH ONCE DAILY.  5  . chlorthalidone  (HYGROTON) 25 MG tablet Take 25 mg by mouth daily.     .Marland Kitchendocusate sodium (COLACE) 100 MG capsule Take 1 capsule (100 mg total) by mouth 2 (two) times daily. (Patient not taking: Reported on 04/09/2017) 60 capsule 0  . LUBRICANT EYE DROPS 0.4-0.3 % SOLN Apply 1 drop to eye daily as needed.      No current facility-administered medications for this visit.  PHYSICAL EXAMINATION: ECOG PERFORMANCE STATUS: 1 - Symptomatic but completely ambulatory Vitals:   05/23/17 1007  BP: 103/72  Pulse: 80  Resp: 18  Temp: 98.1 F (36.7 C)   Filed Weights   05/23/17 1007  Weight: 207 lb 3.2 oz (94 kg)    Physical Exam  Constitutional: He is oriented to person, place, and time and well-developed, well-nourished, and in no distress. No distress.  HENT:  Head: Normocephalic and atraumatic.  Nose: Nose normal.  Mouth/Throat: Oropharynx is clear and moist. No oropharyngeal exudate.  Eyes: Pupils are equal, round, and reactive to light. EOM are normal. Left eye exhibits no discharge. No scleral icterus.  Neck: Normal range of motion. Neck supple. No JVD present.  Cardiovascular: Normal rate, regular rhythm and normal heart sounds.  No murmur heard. Pulmonary/Chest: Effort normal and breath sounds normal. No respiratory distress. He has no wheezes. He has no rales. He exhibits no tenderness.  Abdominal: Soft. Bowel sounds are normal. He exhibits no distension and no mass. There is no tenderness. There is no rebound and no guarding.  Musculoskeletal: Normal range of motion. He exhibits no edema or tenderness.  Lymphadenopathy:    He has no cervical adenopathy.  Neurological: He is alert and oriented to person, place, and time. No cranial nerve deficit. He exhibits normal muscle tone. Coordination normal.  Skin: Skin is warm and dry. No rash noted. He is not diaphoretic. No erythema.  Psychiatric: Affect and judgment normal.     LABORATORY DATA:  I have reviewed the data as listed Lab Results    Component Value Date   WBC 8.4 05/23/2017   HGB 13.6 05/23/2017   HCT 40.3 05/23/2017   MCV 82.1 05/23/2017   PLT 130 (L) 05/23/2017   Recent Labs    08/13/16 0325 11/19/16 1126 03/21/17 1243 04/09/17 1155 05/23/17 0945  NA 135  --  136  --  139  K 3.8  --  4.5  --  3.8  CL 101  --  104  --  104  CO2 22  --  24  --  24  GLUCOSE 127*  --  112*  --  131*  BUN 34*  --  18  --  19  CREATININE 1.99* 1.00 1.00  --  1.05  CALCIUM 9.0  --  9.0  --  8.9  GFRNONAA 33*  --  >60  --  >60  GFRAA 38*  --  >60  --  >60  PROT  --   --  8.0  --  7.5  ALBUMIN  --   --  4.3  --  3.9  AST  --   --  44*  --  42*  ALT  --   --  33  --  27  ALKPHOS  --   --  88  --  100  BILITOT  --   --  1.5* 1.7* 1.9*  BILIDIR  --   --   --  0.3  --    Hepatitis panel negative.  LDH 228, mildly elevated Beta 2 microglobulin normal.   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 The morphologic and immunophenotypic features are most consistent with chronic lymphocytic leukemia/small lymphocytic lymphoma. Bone marrow Cytogenetics : normal.   Peripheral blood FISH panel negative for CCND1- IGH mutation, ATM, 12, 13q, Tp53 mutation.   ASSESSMENT & PLAN:  1. CLL (chronic lymphocytic leukemia) ([place])   2. Goals of care, counseling/discussion  3. Thrombocytopenia ([place])   4. B12 deficiency   5. Splenomegaly   6. Indirect hyperbilirubinemia    # CLL, stage IV disease given spelencemagly, thrombocytopenia, symptomatic with fatigue and weight loss.  CLL IPL score 4, high risk.  Plan starting Obinutuzumab single agent first and add Venetoclax combination.  Ibrutinib is not ideal for him given that he is on anticoagulation with Warfarin and Plavix.   Side effects of obinutuzumab including but not limited to infusion reaction, reactivation of hepatitis, fatal Progressive Multifocal Leukoencephalopathy, tumor lysis syndrome , dsicussed in details with patient and  he agrees to proceed. Will arrange chemotherapy class for him.  Plan start next week.  Allopurinol Rx sent to pharmacy.   # B12 deficiency: improved after parental B12 injections. antiparietal antibody and intrinsic antibody negative.  Continue daily vitamin B12 151mg oral supplements.   # Advise achol cessation. .  # Indirect hyperbilirubinemia: stable.   All questions were answered. The patient knows to call the clinic with any problems questions or concerns.  Return of visit: Day 1 of Obinutuzumab treatment.     ZEarlie Server MD, PhD Hematology Oncology CMonroe Community Hospitalat AChristus St. Michael Health SystemPager- [phone]/18/2019

## 2017-05-24 NOTE — Progress Notes (Signed)
START ON PATHWAY REGIMEN - Lymphoma and CLL     A cycle is every 28 days:     Obinutuzumab      Obinutuzumab      Obinutuzumab      Obinutuzumab      Chlorambucil   **Always confirm dose/schedule in your pharmacy ordering system**    Patient Characteristics: Chronic Lymphocytic Leukemia (CLL), First Line, Treatment Indicated, 17p del (-) or Unknown, Frail Patient Disease Type: Chronic Lymphocytic Leukemia (CLL) Disease Type: Not Applicable Disease Type: Not Applicable Line of therapy: First Line RAI Stage: IV Treatment Indicated<= Treatment Indicated 17p Deletion Status: Negative Fit or Frail Patient<= Frail Patient Intent of Therapy: Non-Curative / Palliative Intent, Discussed with Patient

## 2017-05-26 ENCOUNTER — Inpatient Hospital Stay: Payer: Medicare PPO

## 2017-05-26 ENCOUNTER — Inpatient Hospital Stay (HOSPITAL_BASED_OUTPATIENT_CLINIC_OR_DEPARTMENT_OTHER): Payer: Medicare PPO | Admitting: Nurse Practitioner

## 2017-05-26 DIAGNOSIS — K2270 Barrett's esophagus without dysplasia: Secondary | ICD-10-CM

## 2017-05-26 DIAGNOSIS — C9190 Lymphoid leukemia, unspecified not having achieved remission: Secondary | ICD-10-CM

## 2017-05-26 DIAGNOSIS — E785 Hyperlipidemia, unspecified: Secondary | ICD-10-CM

## 2017-05-26 DIAGNOSIS — D6861 Antiphospholipid syndrome: Secondary | ICD-10-CM

## 2017-05-26 DIAGNOSIS — K760 Fatty (change of) liver, not elsewhere classified: Secondary | ICD-10-CM

## 2017-05-26 DIAGNOSIS — I2510 Atherosclerotic heart disease of native coronary artery without angina pectoris: Secondary | ICD-10-CM

## 2017-05-26 DIAGNOSIS — J449 Chronic obstructive pulmonary disease, unspecified: Secondary | ICD-10-CM

## 2017-05-26 DIAGNOSIS — I714 Abdominal aortic aneurysm, without rupture: Secondary | ICD-10-CM

## 2017-05-26 DIAGNOSIS — Z79899 Other long term (current) drug therapy: Secondary | ICD-10-CM

## 2017-05-26 DIAGNOSIS — Z5111 Encounter for antineoplastic chemotherapy: Secondary | ICD-10-CM | POA: Diagnosis not present

## 2017-05-26 DIAGNOSIS — M069 Rheumatoid arthritis, unspecified: Secondary | ICD-10-CM

## 2017-05-26 DIAGNOSIS — I7410 Embolism and thrombosis of unspecified parts of aorta: Secondary | ICD-10-CM

## 2017-05-26 DIAGNOSIS — R634 Abnormal weight loss: Secondary | ICD-10-CM

## 2017-05-26 DIAGNOSIS — I4891 Unspecified atrial fibrillation: Secondary | ICD-10-CM | POA: Diagnosis not present

## 2017-05-26 DIAGNOSIS — I5022 Chronic systolic (congestive) heart failure: Secondary | ICD-10-CM

## 2017-05-26 DIAGNOSIS — E1151 Type 2 diabetes mellitus with diabetic peripheral angiopathy without gangrene: Secondary | ICD-10-CM

## 2017-05-26 DIAGNOSIS — D3500 Benign neoplasm of unspecified adrenal gland: Secondary | ICD-10-CM

## 2017-05-26 DIAGNOSIS — E538 Deficiency of other specified B group vitamins: Secondary | ICD-10-CM | POA: Diagnosis not present

## 2017-05-26 DIAGNOSIS — F17210 Nicotine dependence, cigarettes, uncomplicated: Secondary | ICD-10-CM

## 2017-05-26 DIAGNOSIS — C9110 Chronic lymphocytic leukemia of B-cell type not having achieved remission: Secondary | ICD-10-CM

## 2017-05-26 DIAGNOSIS — G4733 Obstructive sleep apnea (adult) (pediatric): Secondary | ICD-10-CM

## 2017-05-26 DIAGNOSIS — R5383 Other fatigue: Secondary | ICD-10-CM

## 2017-05-26 DIAGNOSIS — R630 Anorexia: Secondary | ICD-10-CM

## 2017-05-26 DIAGNOSIS — Z7901 Long term (current) use of anticoagulants: Secondary | ICD-10-CM

## 2017-05-26 DIAGNOSIS — R161 Splenomegaly, not elsewhere classified: Secondary | ICD-10-CM | POA: Diagnosis not present

## 2017-05-26 DIAGNOSIS — I700 Atherosclerosis of aorta: Secondary | ICD-10-CM

## 2017-05-26 DIAGNOSIS — N400 Enlarged prostate without lower urinary tract symptoms: Secondary | ICD-10-CM

## 2017-05-26 DIAGNOSIS — I252 Old myocardial infarction: Secondary | ICD-10-CM

## 2017-05-26 DIAGNOSIS — D696 Thrombocytopenia, unspecified: Secondary | ICD-10-CM | POA: Diagnosis not present

## 2017-05-26 DIAGNOSIS — K449 Diaphragmatic hernia without obstruction or gangrene: Secondary | ICD-10-CM

## 2017-05-26 DIAGNOSIS — Z7982 Long term (current) use of aspirin: Secondary | ICD-10-CM

## 2017-05-26 MED ORDER — ALLOPURINOL 300 MG PO TABS: 300.0000 mg | ORAL_TABLET | Freq: Every day | ORAL | 0 refills | Status: DC

## 2017-05-26 MED ORDER — IPRATROPIUM-ALBUTEROL 20-100 MCG/ACT IN AERS: 1.0000 | INHALATION_SPRAY | Freq: Four times a day (QID) | RESPIRATORY_TRACT | 11 refills | Status: DC

## 2017-05-26 NOTE — Progress Notes (Signed)
[place]  [phone] [phone]  Patient Care Team: [name], MD as PCP - General (Internal Medicine) Earlie Server, MD as Medical Oncologist (Medical Oncology)   Name of the patient: Martin Adkins  [id number]  Jan 03, 1946   Date of visit: 05/26/17  Diagnosis- CLL  Chief complaint/Reason for visit- Initial Meeting for [facility], preparing for starting chemotherapy  Primary Oncologist: Dr. Tasia Catchings  Heme/Onc history:  Oncology History   Patient initially presented as 72 year old male who is referred for evaluation of lymphocytosis.  Lab work on 03/05/2017 showed wbc 11.4, hb 14.6, plt 117,000, lymphocytosis 63.5%, neutrophils 22%.  At that time he was reporting chronic fatigue, no weight loss, fever, or chills.  He was feeling chest "rattling".  Head CT for chest which showed acute finding, chronic finding includes postsurgical changes consistent with coronary bypass grafting.  1 of bypass grafts arising from the aorta shows apparent thrombosis with an area of distal aneurysmal dilatation which is also thrombosed.  Right adrenal adenoma stable in appearance. Scarring in the lung bases.  Fatty liver. He was taking aspirin 81 mg, Coumadin, and Plavix.  He is on anticoagulation for previous history of clots.  He denied any bleeding events.  He has a history of alcoholism (drinking hard liquor daily-quit 2014-2015).  Now reports only drinking wine on the weekends.  Image Studies # 04/04/2017  US abdomen showed 1. Splenomegaly. No focal splenic lesions evident.2. Increased liver echogenicity, a finding most likely indicative of hepatic steatosis. While no focal liver lesions are evident on this study, it must be cautioned that the sensitivity of ultrasound for detection of focal liver lesions is diminished in this circumstance.3. Pancreas obscured by gas. Portions of inferior vena cava obscured by gas.4.  Gallbladder absent.5. Status post abdominal aortic aneurysm repair. No periaortic fluid.  # 04/21/2017 PET scan 1. Moderate splenomegaly with uniform splenic hypermetabolism,compatible with the provided history of lymphoproliferative disease.No additional hypermetabolic sites of lymphoproliferative disease. Specifically, no hypermetabolic lymphadenopathy Hepatic steatosis, aortic atherosclerosis. Aneurysm.   # # CLL, stage IV disease given splenomegaly, thrombocytopenia, symptomatic with fatigue and weight loss.  CLL IPL score 4, high risk.      CLL (chronic lymphocytic leukemia) ([place])   05/24/2017 Initial Diagnosis    CLL (chronic lymphocytic leukemia) ([place])      05/27/2017 -  Chemotherapy    The patient had obinutuzumab (GAZYVA) 100 mg in sodium chloride 0.9 % 100 mL (0.9615 mg/mL) chemo infusion, 100 mg, Intravenous, Once, 1 of 6 cycles Administration: 100 mg (05/27/2017), 900 mg (05/28/2017), 1,000 mg (06/03/2017)  for chemotherapy treatment.       Interval history- patient presents to [facility] for initial meeting and discussion of upcoming initiation of Gazyva. We discussed the role of chemo care clinic. He advises that he does have a PCP and insurance. He has had multiple ED visits lately due to recent diagnosis of CLL. He has a significant cardiac history. He has chronic SOB and continues to smoke. He is trying to cut back. He reports a past history of high alcohol use but has cut back to socially drinking. He denies having problems with transportation to appointments and states that he has a good support system in place. He denies problems paying for or getting his medications.  He has not been taking one of his maintenance inhalers as he was unsure difference in long acting and short acting breathing medications. He also has not  picked up the allopurinol as he thought the medication was for gout which he says he does not have.   ECOG FS:1 - Symptomatic but completely  ambulatory  Review of systems- Review of Systems  Constitutional: Negative for chills, fever, malaise/fatigue and weight loss.  HENT: Negative for congestion, ear discharge, ear pain, sinus pain, sore throat and tinnitus.   Eyes: Negative.   Respiratory: Positive for cough, shortness of breath and wheezing. Negative for sputum production.   Cardiovascular: Negative for chest pain, palpitations, orthopnea, claudication and leg swelling.  Gastrointestinal: Negative for abdominal pain, blood in stool, constipation, diarrhea, heartburn, nausea and vomiting.  Genitourinary: Negative.   Musculoskeletal: Negative.   Skin: Negative.   Neurological: Negative for dizziness, tingling, weakness and headaches.  Endo/Heme/Allergies: Negative.   Psychiatric/Behavioral: Negative.     Current treatment- Consideration of cycle 1, day 1 of Obinutuzumab on 05/27/17.   Allergies  Allergen Reactions  . Ace Inhibitors Other (See Comments)    Other reaction(s): Cough  . Pravastatin Other (See Comments)    Other reaction(s): Muscle Pain  . Rosuvastatin Other (See Comments)    Other reaction(s): Other (See Comments) GI bleed  . Simvastatin Other (See Comments)    Other reaction(s): Muscle Pain  . Amoxicillin Rash  . Niacin Rash  . Penicillins Rash    Has patient had a PCN reaction causing immediate rash, facial/tongue/throat swelling, SOB or lightheadedness with hypotension: Yes Has patient had a PCN reaction causing severe rash involving mucus membranes or skin necrosis: Unknown Has patient had a PCN reaction that required hospitalization: Unknown Has patient had a PCN reaction occurring within the last 10 years: No If all of the above answers are "NO", then may proceed with Cephalosporin use.     Past Medical History:  Diagnosis Date  . AAA (abdominal aortic aneurysm) without rupture ([place]) 04/05/2014  . AAA (abdominal aortic aneurysm) without rupture ([place]) 04/05/2014  . Abdominal aortic aneurysm  without rupture ([place])   . Acute non-ST elevation myocardial infarction (NSTEMI) ([place]) 08/15/2014  . 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/28/2014  . APS (antiphospholipid syndrome) ([place])   . Arteriosclerosis of coronary artery 04/05/2014   Overview:  Sp cabg with lima to lad svg to d1 om1 rca 2004 with occluded svg to rca and d1 and pci stetn of om1 graft 2015   . Arthritis   . B12 deficiency 03/21/2017  . Barrett's esophagus   . Benign essential HTN 05/02/2014  . BPH (benign prostatic hyperplasia)   . CAD (coronary artery disease)   . CHF (congestive heart failure) ([place])   . Chronic systolic heart failure ([place]) 04/19/2014   Overview:  With segmental LV systolic dysfunction ejection fraction of 35%   . CLL (chronic lymphocytic leukemia) ([place])   . CLL (chronic lymphocytic leukemia) ([place]) 05/24/2017  . Diabetes ([place])   . DVT (deep venous thrombosis) ([place])   . Dysrhythmia    ATRIAL FIB.  Marland Kitchen GERD (gastroesophageal reflux disease)   . History of hiatal hernia   . History of shingles 2013  . Hyperlipemia   . Hyperplastic polyps of stomach   . Hypertension   . Myocardial infarction ([place]) 07/30/2014  . NSTEMI (non-ST elevated myocardial infarction) ([place])   . OSA (obstructive sleep apnea)   . Osteoarthritis   . PVD (peripheral vascular disease) ([place])   . RA (rheumatoid arthritis) ([place])   . SOB (shortness of breath)      Past Surgical History:  Procedure Laterality Date  . ABDOMINAL AORTA STENT    .  CHOLECYSTECTOMY    . COLONOSCOPY  11/24/2009  . CORONARY ANGIOPLASTY WITH STENT PLACEMENT  2015  . CORONARY ARTERY BYPASS GRAFT    . ESOPHAGOGASTRODUODENOSCOPY  05/26/02  03/23/12  . ESOPHAGOGASTRODUODENOSCOPY (EGD) WITH PROPOFOL N/A 10/28/2016   Procedure: ESOPHAGOGASTRODUODENOSCOPY (EGD) WITH PROPOFOL;  Surgeon: Manya Silvas, MD;  Location: [facility] ENDOSCOPY;  Service: Endoscopy;  Laterality: N/A;  . PERIPHERAL VASCULAR CATHETERIZATION N/A 09/06/2014   Procedure: IVC Filter Removal;  Surgeon:  Katha Cabal, MD;  Location: [place] CV LAB;  Service: Cardiovascular;  Laterality: N/A;  . TRANSURETHRAL RESECTION OF PROSTATE N/A 02/20/2015   Procedure: TRANSURETHRAL RESECTION OF THE PROSTATE (TURP);  Surgeon: Hollice Espy, MD;  Location: [facility] ORS;  Service: Urology;  Laterality: N/A;    Social History   Socioeconomic History  . Marital status: Legally Separated    Spouse name: Not on file  . Number of children: Not on file  . Years of education: Not on file  . Highest education level: Not on file  Occupational History  . Not on file  Social Needs  . Financial resource strain: Not on file  . Food insecurity:    Worry: Not on file    Inability: Not on file  . Transportation needs:    Medical: Not on file    Non-medical: Not on file  Tobacco Use  . Smoking status: Current Some Day Smoker    Packs/day: 0.25    Years: 20.00    Pack years: 5.00    Types: Cigarettes  . Smokeless tobacco: Never Used  . Tobacco comment: per pt nicotine patch number46m -smokes 1-3 cig/per d now  Substance and Sexual Activity  . Alcohol use: Yes    Alcohol/week: 0.0 oz    Comment: social on weekends  . Drug use: No  . Sexual activity: Never  Lifestyle  . Physical activity:    Days per week: Not on file    Minutes per session: Not on file  . Stress: Not on file  Relationships  . Social connections:    Talks on phone: Not on file    Gets together: Not on file    Attends religious service: Not on file    Active member of club or organization: Not on file    Attends meetings of clubs or organizations: Not on file    Relationship status: Not on file  . Intimate partner violence:    Fear of current or ex partner: Not on file    Emotionally abused: Not on file    Physically abused: Not on file    Forced sexual activity: Not on file  Other Topics Concern  . Not on file  Social History Narrative  . Not on file    Family History  Problem Relation Age of Onset  . Cancer  Mother 786      breast ca  . Diabetes Mother   . Coronary artery disease Father   . Heart attack Father   . Prostate cancer Brother   . Bladder Cancer Neg Hx   . Kidney cancer Neg Hx     Current Outpatient Medications:  .  albuterol (PROVENTIL HFA;VENTOLIN HFA) 108 (90 Base) MCG/ACT inhaler, Inhale 2 puffs into the lungs every 6 (six) hours as needed for wheezing or shortness of breath., Disp: 1 Inhaler, Rfl: 0 .  amLODipine (NORVASC) 10 MG tablet, Take 10 mg by mouth daily. , Disp: , Rfl:  .  aspirin EC 81 MG tablet, Take 81 mg  by mouth daily., Disp: , Rfl:  .  atorvastatin (LIPITOR) 80 MG tablet, TAKE 1 TABLET (80 MG TOTAL) BY MOUTH ONCE DAILY., Disp: , Rfl: 11 .  fluticasone (FLONASE) 50 MCG/ACT nasal spray, Place 2 sprays into both nostrils daily. , Disp: , Rfl:  .  furosemide (LASIX) 20 MG tablet, Take 20 mg by mouth daily., Disp: , Rfl: 11 .  gabapentin (NEURONTIN) 300 MG capsule, Take 300 mg by mouth 2 (two) times daily., Disp: , Rfl:  .  hydroxychloroquine (PLAQUENIL) 200 MG tablet, Take 1 tablet by mouth daily., Disp: , Rfl:  .  isosorbide mononitrate (IMDUR) 30 MG 24 hr tablet, Take 30 mg by mouth daily. , Disp: , Rfl:  .  losartan (COZAAR) 100 MG tablet, 100 mg daily. , Disp: , Rfl:  .  LUBRICANT EYE DROPS 0.4-0.3 % SOLN, Apply 1 drop to eye daily as needed. , Disp: , Rfl:  .  metoprolol succinate (TOPROL-XL) 100 MG 24 hr tablet, Take 100 mg by mouth daily. , Disp: , Rfl:  .  pantoprazole (PROTONIX) 40 MG tablet, Take 40 mg by mouth daily. , Disp: , Rfl:  .  potassium chloride SA (K-DUR,KLOR-CON) 20 MEQ tablet, Take 20 mEq by mouth daily. , Disp: , Rfl:  .  vitamin B-12 (CYANOCOBALAMIN) 100 MCG tablet, Take 1 tablet (100 mcg total) by mouth daily., Disp: 90 tablet, Rfl: 1 .  warfarin (COUMADIN) 1 MG tablet, Take by mouth daily. Patient takes once a week with 33m tablet for a total of 6 mg per day, Disp: , Rfl:  .  warfarin (COUMADIN) 5 MG tablet, TAKE 1 TABLET (5 MG TOTAL) BY  MOUTH ONCE DAILY., Disp: , Rfl: 5 .  allopurinol (ZYLOPRIM) 300 MG tablet, Take 1 tablet (300 mg total) by mouth daily. (Patient not taking: Reported on 05/26/2017), Disp: 30 tablet, Rfl: 0 .  docusate sodium (COLACE) 100 MG capsule, Take 1 capsule (100 mg total) by mouth 2 (two) times daily. (Patient not taking: Reported on 05/26/2017), Disp: 60 capsule, Rfl: 0  Physical exam:  Vitals:   05/26/17 1055  BP: 127/86  Pulse: 74  Resp: 16  Temp: 98.4 F (36.9 C)  TempSrc: Tympanic  SpO2: 98%  Weight: 207 lb (93.9 kg)   Physical Exam  Constitutional: He is oriented to person, place, and time. He appears well-developed and well-nourished. No distress.  HENT:  Head: Normocephalic and atraumatic.  Nose: Nose normal.  Mouth/Throat: Oropharynx is clear and moist. No oropharyngeal exudate.  Eyes: Pupils are equal, round, and reactive to light. Conjunctivae and EOM are normal. No scleral icterus.  Neck: Normal range of motion. Neck supple.  Cardiovascular: Normal rate, regular rhythm and normal heart sounds.  Pulmonary/Chest: Effort normal. He has wheezes.  Abdominal: Soft. Bowel sounds are normal. He exhibits no distension. There is no tenderness.  Musculoskeletal: Normal range of motion. He exhibits no edema.  Neurological: He is alert and oriented to person, place, and time.  Skin: Skin is warm and dry.  Psychiatric: He has a normal mood and affect. His behavior is normal.  Vitals reviewed.    CMP Latest Ref Rng & Units 05/23/2017  Glucose 65 - 99 mg/dL 131(H)  BUN 6 - 20 mg/dL 19  Creatinine 0.61 - 1.24 mg/dL 1.05  Sodium 135 - 145 mmol/L 139  Potassium 3.5 - 5.1 mmol/L 3.8  Chloride 101 - 111 mmol/L 104  CO2 22 - 32 mmol/L 24  Calcium 8.9 - 10.3 mg/dL 8.9  Total  Protein 6.5 - 8.1 g/dL 7.5  Total Bilirubin 0.3 - 1.2 mg/dL 1.9(H)  Alkaline Phos 38 - 126 U/L 100  AST 15 - 41 U/L 42(H)  ALT 17 - 63 U/L 27   CBC Latest Ref Rng & Units 05/23/2017  WBC 3.8 - 10.6 K/uL 8.4  Hemoglobin  13.0 - 18.0 g/dL 13.6  Hematocrit 40.0 - 52.0 % 40.3  Platelets 150 - 440 K/uL 130(L)    No images are attached to the encounter.  Ct Biopsy  Result Date: 05/13/2017 CLINICAL DATA:  Lymphocytosis, thrombocytopenia and abnormal peripheral blood flow cytometry demonstrating suspicion of CLL versus mantle cell lymphoma. The patient requires bone marrow biopsy for further workup. EXAM: CT GUIDED BONE MARROW ASPIRATION AND BIOPSY ANESTHESIA/SEDATION: Versed 2.0 mg IV, Fentanyl 150 mcg IV Total Moderate Sedation Time:   16 minutes. The patient's level of consciousness and physiologic status were continuously monitored during the procedure by Radiology nursing. PROCEDURE: The procedure risks, benefits, and alternatives were explained to the patient. Questions regarding the procedure were encouraged and answered. The patient understands and consents to the procedure. A time out was performed prior to initiating the procedure. The right gluteal region was prepped with chlorhexidine. Sterile gown and sterile gloves were used for the procedure. Local anesthesia was provided with 1% Lidocaine. Under CT guidance, an 11 gauge On Control bone cutting needle was advanced from a posterior approach into the right iliac bone. Needle positioning was confirmed with CT. Initial non heparinized and heparinized aspirate samples were obtained of bone marrow. Core biopsy was performed via the On Control drill needle. COMPLICATIONS: None FINDINGS: Inspection of initial aspirate did reveal visible particles. Intact core biopsy sample was obtained. IMPRESSION: CT guided bone marrow biopsy of right posterior iliac bone with both aspirate and core samples obtained. Electronically Signed   By: Aletta Edouard M.D.   On: 05/13/2017 10:39     Assessment and plan- Patient is a 72 y.o. male with new diagnosis of CLL who is seen in [facility] for initial meeting in preparation of initiating chemotherapy on 05/27/17.    1. CLL- Plan  for Obinutuzumab chemotherapy. On tumor lysis prophylaxis. Discussed at length the mechanism for tumor lysis and rationale for use of medications, specifically allopurinol. He verbalizes understanding and   2. COPD- refill of combivent inhaler provided.   3. CAD, CHF, A fib- under care of Dr. Nehemiah Massed.   4. Tobacco Abuse- We discussed the pathophysiology of nicotine dependence and different strategies to stop smoking.  I shared with him that there is limited data to suggest that e-cigarettes help in smoking cessation efforts; we do know that many of the flavorings in e-cigarettes have known carcinogens.  The gold standard of tobacco cessation is nicotine replacement (with nicotine patches & gum/lozenges) or Varenicline (Chantix).  We discussed that the typical craving/urge to smoke lasts about 3-5 minutes; his biggest trigger(s) to use cigarettes is alcohol and stress.  Encouraged him to set some new "rules" that do not allow him to smoke in the car; instead he could use a piece of nicotine gum or a lozenge when she has a craving. I gave him instructions on how to use these nicotine replacement products.  Given his likely high dependence, he will need continued support with regards to smoking cessation.  Mr.Baratta  understands that data suggests that "cold [place]" is the least effective way to stop using tobacco products.  Having a clear "quit plan" is much more effective and requires a  step-wise approach with continued support from a tobacco treatment specialist.  I encouraged him to reach out to me if he has further questions or interest in his continued cessation efforts.  Greater than 5 minutes was spent in smoking cessation counseling with this patient. He states he has used nicotine patches in the past with his PCP and was successful in quitting. He was advised to f/u with his pcp for additional counseling and refill.   5. Alcohol Use- Discussed the risks for alcohol use and abuse particularly in  setting of obinutuzumab and CLL. We discussed specific strategies for cutting down including avoiding triggers and setting goals. Encouraged to have a plan to handle urges and to have a plan for saying no. He was advised to avoid alcohol all together and reports that he feels this is attainable.   6. High Risk of ED Utilization- We discussed that based on his comorbidities and upcoming chemotherapy he is at high risk for ER utilization. I advised that our goal is to keep patients out of the ER as much as possible while they are on treatment. Resources including our triage line, MD on call, and Symptom Management Clinic were discussed and offered as ways of getting care when needed. We discussed situations that may require PCP vs Oncology vs Urgent Care vs ED visits. He has a PCP, Dr. Edwina Barth, at [facility] clinic.   Visit Diagnosis 1. CLL (chronic lymphocytic leukemia) ([place])     Patient expressed understanding and was in agreement with this plan. He also understands that He can call clinic at any time with any questions, concerns, or complaints.   Beckey Rutter, DNP, AGNP-C [place] at [facility] [phone] (work cell) [phone] (office)

## 2017-05-27 ENCOUNTER — Inpatient Hospital Stay: Payer: Medicare PPO

## 2017-05-27 ENCOUNTER — Inpatient Hospital Stay (HOSPITAL_BASED_OUTPATIENT_CLINIC_OR_DEPARTMENT_OTHER): Payer: Medicare PPO | Admitting: Oncology

## 2017-05-27 VITALS — BP 111/70 | HR 61 | Resp 18

## 2017-05-27 VITALS — BP 116/77 | HR 62 | Temp 98.4°F | Resp 18 | Wt 207.0 lb

## 2017-05-27 DIAGNOSIS — D696 Thrombocytopenia, unspecified: Secondary | ICD-10-CM

## 2017-05-27 DIAGNOSIS — Z7982 Long term (current) use of aspirin: Secondary | ICD-10-CM

## 2017-05-27 DIAGNOSIS — R161 Splenomegaly, not elsewhere classified: Secondary | ICD-10-CM

## 2017-05-27 DIAGNOSIS — K760 Fatty (change of) liver, not elsewhere classified: Secondary | ICD-10-CM

## 2017-05-27 DIAGNOSIS — I252 Old myocardial infarction: Secondary | ICD-10-CM

## 2017-05-27 DIAGNOSIS — R634 Abnormal weight loss: Secondary | ICD-10-CM

## 2017-05-27 DIAGNOSIS — Z79899 Other long term (current) drug therapy: Secondary | ICD-10-CM

## 2017-05-27 DIAGNOSIS — F17210 Nicotine dependence, cigarettes, uncomplicated: Secondary | ICD-10-CM | POA: Diagnosis not present

## 2017-05-27 DIAGNOSIS — G4733 Obstructive sleep apnea (adult) (pediatric): Secondary | ICD-10-CM

## 2017-05-27 DIAGNOSIS — E785 Hyperlipidemia, unspecified: Secondary | ICD-10-CM

## 2017-05-27 DIAGNOSIS — K449 Diaphragmatic hernia without obstruction or gangrene: Secondary | ICD-10-CM

## 2017-05-27 DIAGNOSIS — T8090XA Unspecified complication following infusion and therapeutic injection, initial encounter: Secondary | ICD-10-CM

## 2017-05-27 DIAGNOSIS — C9110 Chronic lymphocytic leukemia of B-cell type not having achieved remission: Secondary | ICD-10-CM

## 2017-05-27 DIAGNOSIS — J449 Chronic obstructive pulmonary disease, unspecified: Secondary | ICD-10-CM

## 2017-05-27 DIAGNOSIS — E538 Deficiency of other specified B group vitamins: Secondary | ICD-10-CM | POA: Diagnosis not present

## 2017-05-27 DIAGNOSIS — D6861 Antiphospholipid syndrome: Secondary | ICD-10-CM

## 2017-05-27 DIAGNOSIS — R630 Anorexia: Secondary | ICD-10-CM

## 2017-05-27 DIAGNOSIS — Z5112 Encounter for antineoplastic immunotherapy: Secondary | ICD-10-CM

## 2017-05-27 DIAGNOSIS — K2270 Barrett's esophagus without dysplasia: Secondary | ICD-10-CM

## 2017-05-27 DIAGNOSIS — I5022 Chronic systolic (congestive) heart failure: Secondary | ICD-10-CM

## 2017-05-27 DIAGNOSIS — M069 Rheumatoid arthritis, unspecified: Secondary | ICD-10-CM

## 2017-05-27 DIAGNOSIS — I700 Atherosclerosis of aorta: Secondary | ICD-10-CM

## 2017-05-27 DIAGNOSIS — N400 Enlarged prostate without lower urinary tract symptoms: Secondary | ICD-10-CM

## 2017-05-27 DIAGNOSIS — C9190 Lymphoid leukemia, unspecified not having achieved remission: Secondary | ICD-10-CM

## 2017-05-27 DIAGNOSIS — D3500 Benign neoplasm of unspecified adrenal gland: Secondary | ICD-10-CM

## 2017-05-27 DIAGNOSIS — Z5111 Encounter for antineoplastic chemotherapy: Secondary | ICD-10-CM | POA: Diagnosis not present

## 2017-05-27 DIAGNOSIS — R5383 Other fatigue: Secondary | ICD-10-CM

## 2017-05-27 DIAGNOSIS — I714 Abdominal aortic aneurysm, without rupture: Secondary | ICD-10-CM

## 2017-05-27 DIAGNOSIS — I7410 Embolism and thrombosis of unspecified parts of aorta: Secondary | ICD-10-CM

## 2017-05-27 DIAGNOSIS — I4891 Unspecified atrial fibrillation: Secondary | ICD-10-CM

## 2017-05-27 DIAGNOSIS — E1151 Type 2 diabetes mellitus with diabetic peripheral angiopathy without gangrene: Secondary | ICD-10-CM

## 2017-05-27 DIAGNOSIS — I2510 Atherosclerotic heart disease of native coronary artery without angina pectoris: Secondary | ICD-10-CM

## 2017-05-27 DIAGNOSIS — Z7901 Long term (current) use of anticoagulants: Secondary | ICD-10-CM

## 2017-05-27 LAB — CBC WITH DIFFERENTIAL/PLATELET
BASOS ABS: 0 10*3/uL (ref 0–0.1)
BASOS PCT: 1 %
EOS PCT: 1 %
Eosinophils Absolute: 0 10*3/uL (ref 0–0.7)
HEMATOCRIT: 40.1 % (ref 40.0–52.0)
Hemoglobin: 13.6 g/dL (ref 13.0–18.0)
Lymphocytes Relative: 62 %
Lymphs Abs: 4.7 10*3/uL — ABNORMAL HIGH (ref 1.0–3.6)
MCH: 27.6 pg (ref 26.0–34.0)
MCHC: 34.1 g/dL (ref 32.0–36.0)
MCV: 81.1 fL (ref 80.0–100.0)
MONO ABS: 0.2 10*3/uL (ref 0.2–1.0)
Monocytes Relative: 3 %
NEUTROS ABS: 2.5 10*3/uL (ref 1.4–6.5)
Neutrophils Relative %: 33 %
PLATELETS: 136 10*3/uL — AB (ref 150–440)
RBC: 4.94 MIL/uL (ref 4.40–5.90)
RDW: 16.2 % — AB (ref 11.5–14.5)
WBC: 7.5 10*3/uL (ref 3.8–10.6)

## 2017-05-27 LAB — COMPREHENSIVE METABOLIC PANEL
ALBUMIN: 3.7 g/dL (ref 3.5–5.0)
ALK PHOS: 95 U/L (ref 38–126)
ALT: 35 U/L (ref 17–63)
ANION GAP: 9 (ref 5–15)
AST: 51 U/L — ABNORMAL HIGH (ref 15–41)
BUN: 17 mg/dL (ref 6–20)
CALCIUM: 8.9 mg/dL (ref 8.9–10.3)
CO2: 23 mmol/L (ref 22–32)
Chloride: 105 mmol/L (ref 101–111)
Creatinine, Ser: 0.98 mg/dL (ref 0.61–1.24)
GFR calc Af Amer: >60 mL/min (ref >60)
GFR calc non Af Amer: >60 mL/min (ref >60)
GLUCOSE: 128 mg/dL — AB (ref 65–99)
Potassium: 3.9 mmol/L (ref 3.5–5.1)
Sodium: 137 mmol/L (ref 135–145)
TOTAL PROTEIN: 7.2 g/dL (ref 6.5–8.1)
Total Bilirubin: 1.6 mg/dL — ABNORMAL HIGH (ref 0.3–1.2)

## 2017-05-27 LAB — URIC ACID: Uric Acid, Serum: 4.3 mg/dL — ABNORMAL LOW (ref 4.4–7.6)

## 2017-05-27 MED ORDER — SODIUM CHLORIDE 0.9 % IV SOLN
Freq: Once | INTRAVENOUS | Status: AC
  Administered 2017-05-27: 09:00:00 via INTRAVENOUS
  Filled 2017-05-27: qty 1000

## 2017-05-27 MED ORDER — FAMOTIDINE IN NACL 20-0.9 MG/50ML-% IV SOLN
20.0000 mg | Freq: Two times a day (BID) | INTRAVENOUS | Status: DC
  Administered 2017-05-27: 20 mg via INTRAVENOUS
  Filled 2017-05-27: qty 50

## 2017-05-27 MED ORDER — DIPHENHYDRAMINE HCL 50 MG/ML IJ SOLN
25.0000 mg | Freq: Once | INTRAMUSCULAR | Status: AC | PRN
  Administered 2017-05-27: 25 mg via INTRAVENOUS

## 2017-05-27 MED ORDER — SODIUM CHLORIDE 0.9 % IV SOLN
100.0000 mg | Freq: Once | INTRAVENOUS | Status: AC
  Administered 2017-05-27: 100 mg via INTRAVENOUS
  Filled 2017-05-27: qty 4

## 2017-05-27 MED ORDER — SODIUM CHLORIDE 0.9 % IV SOLN
20.0000 mg | Freq: Once | INTRAVENOUS | Status: AC
  Administered 2017-05-27: 20 mg via INTRAVENOUS
  Filled 2017-05-27: qty 2

## 2017-05-27 MED ORDER — MONTELUKAST SODIUM 10 MG PO TABS: 10.0000 mg | ORAL_TABLET | ORAL | 0 refills | Status: DC | PRN

## 2017-05-27 MED ORDER — ACETAMINOPHEN 325 MG PO TABS
650.0000 mg | ORAL_TABLET | Freq: Once | ORAL | Status: AC
  Administered 2017-05-27: 650 mg via ORAL
  Filled 2017-05-27: qty 2

## 2017-05-27 MED ORDER — METHYLPREDNISOLONE SODIUM SUCC 125 MG IJ SOLR
125.0000 mg | Freq: Once | INTRAMUSCULAR | Status: AC | PRN
  Administered 2017-05-27: 125 mg via INTRAVENOUS

## 2017-05-27 MED ORDER — SODIUM CHLORIDE 0.9 % IV SOLN
Freq: Once | INTRAVENOUS | Status: AC | PRN
  Administered 2017-05-27: 12:00:00 via INTRAVENOUS
  Filled 2017-05-27: qty 1000

## 2017-05-27 MED ORDER — DIPHENHYDRAMINE HCL 50 MG/ML IJ SOLN
50.0000 mg | Freq: Once | INTRAMUSCULAR | Status: AC
  Administered 2017-05-27: 50 mg via INTRAVENOUS
  Filled 2017-05-27: qty 1

## 2017-05-27 NOTE — Progress Notes (Signed)
Patient had mild reaction to 1st time treatment of Gazyva.  Patient stated that he was beginning to feel warm and looked slightly flushed.  Dr. Tasia Catchings came to infusion.  Benadryl 25 mg given and Solu-medrol 125 mg given per reaction protocol.  Will continue to monitor patient and restart in about 30 minutes.

## 2017-05-27 NOTE — Progress Notes (Signed)
Hematology/Oncology Follow up note [facility] [phone] Fax:([phone]) [phone]   Patient Care Team: [name], MD as PCP - General (Internal Medicine)  REFERRING PROVIDER: [name], MD  REASON FOR VISIT Follow up for chemotherapy management of CLL.  HISTORY OF PRESENTING ILLNESS:  Martin Adkins is a  72 y.o.  male with PMH listed below who was referred to me for evaluation of lymphocytosis.  Recent lab work on 03/05/2017 showed wbc 11.4, hb 14.6, platelet count 117,000, lymphocytosis 63.5%, neutrophil 22%, Report chronic fatigue, no weight loss, fever or chills.  He reports feeling chest "rattleness". Has for CT chest which showed acute finding, chronic finding includes  postsurgical changes consistent with coronary bypass grafting. One of the bypass grafts arising from the aorta shows apparent thrombosis and an area of distal aneurysmal dilatation which is also Thrombosed. Right adrenal adenoma stable in appearance.Mild scarring in the lung bases.Fatty liver.   Takes Aspirin 5m, coumadine, plavix. He is on anticoagulation for previous history of clots.  Denies any bleeding events. Use to drink hard liquir daily, quitted for 4-5 years. Current drinks wine on weekend.    Image Studies # 04/04/2017  [place]domen showed 1.  Splenomegaly.  No focal splenic lesions evident.2. Increased liver echogenicity, a finding most likely indicative of hepatic steatosis. While no focal liver lesions are evident on this study, it must be cautioned that the sensitivity of ultrasound for detection of focal liver lesions is diminished in this circumstance.3. Pancreas obscured by gas. Portions of inferior vena cava obscured by gas.4.  Gallbladder absent.5. Status post abdominal aortic aneurysm repair. No periaortic fluid.  # 04/21/2017 PET scan 1. Moderate splenomegaly with uniform splenic hypermetabolism,compatible with the provided history of  lymphoproliferative disease.No additional hypermetabolic sites of lymphoproliferative disease. Specifically, no hypermetabolic lymphadenopathy Hepatic steatosis, aortic atherosclerosis. Aneurysm.   # # CLL, stage IV disease given spelencemagly, thrombocytopenia, symptomatic with fatigue and weight loss.  CLL IPL score 4, high risk.   INTERVAL HISTORY Martin JBREYLON SHERROWis a 72y.o. male who has above history reviewed by me today presents for follow up visit for management newly diagnosed CLL, assessment prior to cycle 1 Gazyva. Continue to feel , do not have much appetite, weight loss of 4 pounds since 1 months ago.  Has chronic SOB, and intermittent wheezing.     Review of Systems  Constitutional: Positive for malaise/fatigue and weight loss. Negative for chills and fever.  HENT: Negative for congestion, ear discharge, ear pain, nosebleeds, sinus pain and sore throat.   Eyes: Negative for double vision, photophobia, pain, discharge and redness.  Respiratory: Positive for shortness of breath. Negative for cough, hemoptysis, sputum production and wheezing.   Cardiovascular: Negative for chest pain, palpitations, orthopnea, claudication and leg swelling.  Gastrointestinal: Negative for abdominal pain, blood in stool, constipation, diarrhea, heartburn, melena, nausea and vomiting.  Genitourinary: Negative for dysuria, flank pain, frequency, hematuria and urgency.  Musculoskeletal: Negative for back pain, myalgias and neck pain.  Skin: Negative for itching and rash.  Neurological: Negative for dizziness, tingling, tremors, speech change, focal weakness, weakness and headaches.  Endo/Heme/Allergies: Negative for environmental allergies. Does not bruise/bleed easily.  Psychiatric/Behavioral: Negative for depression, hallucinations and suicidal ideas. The patient is not nervous/anxious.     MEDICAL HISTORY:  Past Medical History:  Diagnosis Date  . AAA (abdominal [place]) without  rupture [place] 04/05/2014  . AAA (abdominal aortic aneurysm) without rupture [place] 04/05/2014  . Abdominal aortic aneurysm without rupture (  [place])   . Acute non-ST elevation myocardial infarction (NSTEMI) ([place]) 08/15/2014  . 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/28/2014  . APS (antiphospholipid syndrome) ([place])   . Arteriosclerosis of coronary artery 04/05/2014   Overview:  Sp cabg with lima to lad svg to d1 om1 rca 2004 with occluded svg to rca and d1 and pci stetn of om1 graft 2015   . Arthritis   . B12 deficiency 03/21/2017  . Barrett's esophagus   . Benign essential HTN 05/02/2014  . BPH (benign prostatic hyperplasia)   . CAD (coronary artery disease)   . CHF (congestive heart failure) ([place])   . Chronic systolic heart failure ([place]) 04/19/2014   Overview:  With segmental LV systolic dysfunction ejection fraction of 35%   . CLL (chronic lymphocytic leukemia) ([place])   . CLL (chronic lymphocytic leukemia) ([place]) 05/24/2017  . Diabetes ([place])   . DVT (deep venous thrombosis) ([place])   . Dysrhythmia    ATRIAL FIB.  Marland Kitchen GERD (gastroesophageal reflux disease)   . History of hiatal hernia   . History of shingles 2013  . Hyperlipemia   . Hyperplastic polyps of stomach   . Hypertension   . Myocardial infarction ([place]) 07/30/2014  . NSTEMI (non-ST elevated myocardial infarction) ([employer])   . OSA (obstructive sleep apnea)   . Osteoarthritis   . PVD (peripheral vascular disease) ([place])   . RA (rheumatoid arthritis) ([place])   . SOB (shortness of breath)     SURGICAL HISTORY: Past Surgical History:  Procedure Laterality Date  . ABDOMINAL AORTA STENT    . CHOLECYSTECTOMY    . COLONOSCOPY  11/24/2009  . CORONARY ANGIOPLASTY WITH STENT PLACEMENT  2015  . CORONARY ARTERY BYPASS GRAFT    . ESOPHAGOGASTRODUODENOSCOPY  05/26/02  03/23/12  . ESOPHAGOGASTRODUODENOSCOPY (EGD) WITH PROPOFOL N/A 10/28/2016   Procedure: ESOPHAGOGASTRODUODENOSCOPY (EGD) WITH PROPOFOL;  Surgeon: Manya Silvas, MD;  Location: [facility] ENDOSCOPY;   Service: Endoscopy;  Laterality: N/A;  . PERIPHERAL VASCULAR CATHETERIZATION N/A 09/06/2014   Procedure: IVC Filter Removal;  Surgeon: Katha Cabal, MD;  Location: [place] CV LAB;  Service: Cardiovascular;  Laterality: N/A;  . TRANSURETHRAL RESECTION OF PROSTATE N/A 02/20/2015   Procedure: TRANSURETHRAL RESECTION OF THE PROSTATE (TURP);  Surgeon: Hollice Espy, MD;  Location: [facility] ORS;  Service: Urology;  Laterality: N/A;    SOCIAL HISTORY: Social History   Socioeconomic History  . Marital status: Legally Separated    Spouse name: Not on file  . Number of children: Not on file  . Years of education: Not on file  . Highest education level: Not on file  Occupational History  . Not on file  Social Needs  . Financial resource strain: Not on file  . Food insecurity:    Worry: Not on file    Inability: Not on file  . Transportation needs:    Medical: Not on file    Non-medical: Not on file  Tobacco Use  . Smoking status: Current Some Day Smoker    Packs/day: 0.25    Years: 20.00    Pack years: 5.00    Types: Cigarettes  . Smokeless tobacco: Never Used  . Tobacco comment: per pt nicotine patch number27m -smokes 1-3 cig/per d now  Substance and Sexual Activity  . Alcohol use: Yes    Alcohol/week: 0.0 oz    Comment: social on weekends  . Drug use: No  . Sexual activity: Never  Lifestyle  . Physical activity:    Days per week: Not on file  Minutes per session: Not on file  . Stress: Not on file  Relationships  . Social connections:    Talks on phone: Not on file    Gets together: Not on file    Attends religious service: Not on file    Active member of club or organization: Not on file    Attends meetings of clubs or organizations: Not on file    Relationship status: Not on file  . Intimate partner violence:    Fear of current or ex partner: Not on file    Emotionally abused: Not on file    Physically abused: Not on file    Forced sexual activity: Not on  file  Other Topics Concern  . Not on file  Social History Narrative  . Not on file    FAMILY HISTORY: Family History  Problem Relation Age of Onset  . Cancer Mother 42       breast ca  . Diabetes Mother   . Coronary artery disease Father   . Heart attack Father   . Prostate cancer Brother   . Bladder Cancer Neg Hx   . Kidney cancer Neg Hx     ALLERGIES:  is allergic to ace inhibitors; pravastatin; rosuvastatin; simvastatin; amoxicillin; niacin; and penicillins.  MEDICATIONS:  Current Outpatient Medications  Medication Sig Dispense Refill  . albuterol (PROVENTIL HFA;VENTOLIN HFA) 108 (90 Base) MCG/ACT inhaler Inhale 2 puffs into the lungs every 6 (six) hours as needed for wheezing or shortness of breath. 1 Inhaler 0  . allopurinol (ZYLOPRIM) 300 MG tablet Take 1 tablet (300 mg total) by mouth daily. 30 tablet 0  . amLODipine (NORVASC) 10 MG tablet Take 10 mg by mouth daily.     Marland Kitchen aspirin EC 81 MG tablet Take 81 mg by mouth daily.    Marland Kitchen atorvastatin (LIPITOR) 80 MG tablet TAKE 1 TABLET (80 MG TOTAL) BY MOUTH ONCE DAILY.  11  . docusate sodium (COLACE) 100 MG capsule Take 1 capsule (100 mg total) by mouth 2 (two) times daily. (Patient not taking: Reported on 05/26/2017) 60 capsule 0  . fluticasone (FLONASE) 50 MCG/ACT nasal spray Place 2 sprays into both nostrils daily.     . furosemide (LASIX) 20 MG tablet Take 20 mg by mouth daily.  11  . gabapentin (NEURONTIN) 300 MG capsule Take 300 mg by mouth 2 (two) times daily.    . hydroxychloroquine (PLAQUENIL) 200 MG tablet Take 1 tablet by mouth daily.    . Ipratropium-Albuterol (COMBIVENT) 20-100 MCG/ACT AERS respimat Inhale 1 puff into the lungs every 6 (six) hours. 1 Inhaler 11  . isosorbide mononitrate (IMDUR) 30 MG 24 hr tablet Take 30 mg by mouth daily.     Marland Kitchen losartan (COZAAR) 100 MG tablet 100 mg daily.     Phillis Knack EYE DROPS 0.4-0.3 % SOLN Apply 1 drop to eye daily as needed.     . metoprolol succinate (TOPROL-XL) 100 MG 24 hr  tablet Take 100 mg by mouth daily.     . montelukast (SINGULAIR) 10 MG tablet Take 1 tablet (10 mg total) by mouth as needed. Take before Gazyva infusion. 20 tablet 0  . pantoprazole (PROTONIX) 40 MG tablet Take 40 mg by mouth daily.     . potassium chloride SA (K-DUR,KLOR-CON) 20 MEQ tablet Take 20 mEq by mouth daily.     . vitamin B-12 (CYANOCOBALAMIN) 100 MCG tablet Take 1 tablet (100 mcg total) by mouth daily. 90 tablet 1  . warfarin (COUMADIN) 1  MG tablet Take by mouth daily. Patient takes once a week with 78m tablet for a total of 6 mg per day    . warfarin (COUMADIN) 5 MG tablet TAKE 1 TABLET (5 MG TOTAL) BY MOUTH ONCE DAILY.  5   No current facility-administered medications for this visit.    Facility-Administered Medications Ordered in Other Visits  Medication Dose Route Frequency Provider Last Rate Last Dose  . famotidine (PEPCID) IVPB 20 mg premix  20 mg Intravenous Q12H YEarlie Server MD   Stopped at 05/27/17 [phone] . obinutuzumab (GAZYVA) 100 mg in sodium chloride 0.9 % 100 mL (0.9615 mg/mL) chemo infusion  100 mg Intravenous Once YEarlie Server MD   100 mg at 05/27/17 1114     PHYSICAL EXAMINATION: ECOG PERFORMANCE STATUS: 1 - Symptomatic but completely ambulatory Vitals:   05/28/17 1526  BP: 111/70  Pulse: 61  Resp: 18   There were no vitals filed for this visit.  Physical Exam  Constitutional: He is oriented to person, place, and time and well-developed, well-nourished, and in no distress. No distress.  HENT:  Head: Normocephalic and atraumatic.  Nose: Nose normal.  Mouth/Throat: Oropharynx is clear and moist. No oropharyngeal exudate.  Eyes: Pupils are equal, round, and reactive to light. EOM are normal. Left eye exhibits no discharge. No scleral icterus.  Neck: Normal range of motion. Neck supple. No JVD present.  Cardiovascular: Normal rate, regular rhythm and normal heart sounds.  No murmur heard. Pulmonary/Chest: Effort normal. No respiratory distress. He has wheezes.  He has no rales. He exhibits no tenderness.  Abdominal: Soft. Bowel sounds are normal. He exhibits no distension and no mass. There is no tenderness. There is no rebound and no guarding.  splenomegaly  Musculoskeletal: Normal range of motion. He exhibits no edema, tenderness or deformity.  Lymphadenopathy:    He has no cervical adenopathy.  Neurological: He is alert and oriented to person, place, and time. No cranial nerve deficit. He exhibits normal muscle tone. Coordination normal.  Skin: Skin is warm and dry. No rash noted. He is not diaphoretic. No erythema.  Psychiatric: Affect and judgment normal.  Nursing note and vitals reviewed.    LABORATORY DATA:  I have reviewed the data as listed Lab Results  Component Value Date   WBC 7.5 05/27/2017   HGB 13.6 05/27/2017   HCT 40.1 05/27/2017   MCV 81.1 05/27/2017   PLT 136 (L) 05/27/2017   Recent Labs    03/21/17 1243 04/09/17 1155 05/23/17 0945 05/27/17 0843  NA 136  --  139 137  K 4.5  --  3.8 3.9  CL 104  --  104 105  CO2 24  --  24 23  GLUCOSE 112*  --  131* 128*  BUN 18  --  19 17  CREATININE 1.00  --  1.05 0.98  CALCIUM 9.0  --  8.9 8.9  GFRNONAA >60  --  >60 >60  GFRAA >60  --  >60 >60  PROT 8.0  --  7.5 7.2  ALBUMIN 4.3  --  3.9 3.7  AST 44*  --  42* 51*  ALT 33  --  27 35  ALKPHOS 88  --  100 95  BILITOT 1.5* 1.7* 1.9* 1.6*  BILIDIR  --  0.3  --   --    Hepatitis panel negative.  LDH 228, mildly elevated Beta 2 microglobulin normal.   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  The morphologic and immunophenotypic features are most consistent with chronic lymphocytic leukemia/small lymphocytic lymphoma. Bone marrow Cytogenetics : normal.   Peripheral blood FISH panel negative for CCND1- IGH mutation, ATM, 12, 13q, Tp53 mutation.   ASSESSMENT & PLAN:  1. CLL (chronic lymphocytic leukemia) ([place])   2. Infusion reaction, initial encounter   3. Encounter  for antineoplastic immunotherapy    # Proceed with Cycle 1 Day1 Obinutuzumab Premedication Tylenol, Benadryl 28m x 1 Pepcid x 1 Dexamethasone 267mx 1.  Baseline uric acid checked. He takes Allupurinol.   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 Advise him to use inhalers every 6 hours as needed.   # Infusion reaction: I was called by RN ViOlegario Shearerhat patient developed facial flush and warmth. Vitals were normal. No wheezing. Patient otherwise asymptomatic. Ordered another benadryl 2548mV x 1 and solemedrol 125m48m1.  Symptoms improved and infusion was restarted.   # B12 deficiency: improved after parental B12 injections. antiparietal antibody and intrinsic antibody negative.  Continue daily vitamin B12 100mc57mal supplements.   # Advise achol cessation. .  # Indirect hyperbilirubinemia: stable.   He will come for Day 2 infusion. Singulair was sent to Pharmacy and patient was instructed to pick up medication and take one prior to infusion tomorrow.   All questions were answered. The patient knows to call the clinic with any problems questions or concerns. Total face to face encounter time for this patient visit was 40 min. >50% of the time was  spent in counseling and coordination of care.   Return of visit: Day 8 of Obinutuzumab treatment.   Martin Adkins Earlie Server PhD Hematology Oncology [facility] [id number]

## 2017-05-28 ENCOUNTER — Other Ambulatory Visit: Payer: Self-pay | Admitting: Oncology

## 2017-05-28 ENCOUNTER — Inpatient Hospital Stay: Payer: Medicare PPO

## 2017-05-28 ENCOUNTER — Encounter: Payer: Self-pay | Admitting: Oncology

## 2017-05-28 ENCOUNTER — Ambulatory Visit: Payer: Medicare PPO | Admitting: Oncology

## 2017-05-28 VITALS — BP 111/70 | HR 61 | Temp 96.1°F | Resp 18

## 2017-05-28 DIAGNOSIS — Z5111 Encounter for antineoplastic chemotherapy: Secondary | ICD-10-CM | POA: Diagnosis not present

## 2017-05-28 DIAGNOSIS — C9110 Chronic lymphocytic leukemia of B-cell type not having achieved remission: Secondary | ICD-10-CM

## 2017-05-28 MED ORDER — FAMOTIDINE IN NACL 20-0.9 MG/50ML-% IV SOLN
20.0000 mg | Freq: Two times a day (BID) | INTRAVENOUS | Status: DC
  Administered 2017-05-28: 20 mg via INTRAVENOUS
  Filled 2017-05-28: qty 50

## 2017-05-28 MED ORDER — SODIUM CHLORIDE 0.9 % IV SOLN
Freq: Once | INTRAVENOUS | Status: AC
  Administered 2017-05-28: 09:00:00 via INTRAVENOUS
  Filled 2017-05-28: qty 1000

## 2017-05-28 MED ORDER — DIPHENHYDRAMINE HCL 50 MG/ML IJ SOLN
50.0000 mg | Freq: Once | INTRAMUSCULAR | Status: AC
  Administered 2017-05-28: 50 mg via INTRAVENOUS
  Filled 2017-05-28: qty 1

## 2017-05-28 MED ORDER — SODIUM CHLORIDE 0.9 % IV SOLN
900.0000 mg | Freq: Once | INTRAVENOUS | Status: AC
  Administered 2017-05-28: 900 mg via INTRAVENOUS
  Filled 2017-05-28: qty 36

## 2017-05-28 MED ORDER — ACETAMINOPHEN 325 MG PO TABS
650.0000 mg | ORAL_TABLET | Freq: Once | ORAL | Status: AC
  Administered 2017-05-28: 650 mg via ORAL
  Filled 2017-05-28: qty 2

## 2017-05-28 MED ORDER — SODIUM CHLORIDE 0.9 % IV SOLN
20.0000 mg | Freq: Once | INTRAVENOUS | Status: AC
  Administered 2017-05-28: 20 mg via INTRAVENOUS
  Filled 2017-05-28: qty 2

## 2017-06-03 ENCOUNTER — Inpatient Hospital Stay: Payer: Medicare PPO

## 2017-06-03 ENCOUNTER — Telehealth: Payer: Self-pay

## 2017-06-03 ENCOUNTER — Inpatient Hospital Stay (HOSPITAL_BASED_OUTPATIENT_CLINIC_OR_DEPARTMENT_OTHER): Payer: Medicare PPO | Admitting: Oncology

## 2017-06-03 ENCOUNTER — Other Ambulatory Visit: Payer: Self-pay

## 2017-06-03 VITALS — BP 113/75 | HR 64 | Resp 18

## 2017-06-03 DIAGNOSIS — C9190 Lymphoid leukemia, unspecified not having achieved remission: Secondary | ICD-10-CM | POA: Diagnosis not present

## 2017-06-03 DIAGNOSIS — D696 Thrombocytopenia, unspecified: Secondary | ICD-10-CM

## 2017-06-03 DIAGNOSIS — R634 Abnormal weight loss: Secondary | ICD-10-CM

## 2017-06-03 DIAGNOSIS — T8090XA Unspecified complication following infusion and therapeutic injection, initial encounter: Secondary | ICD-10-CM

## 2017-06-03 DIAGNOSIS — G4733 Obstructive sleep apnea (adult) (pediatric): Secondary | ICD-10-CM

## 2017-06-03 DIAGNOSIS — I5022 Chronic systolic (congestive) heart failure: Secondary | ICD-10-CM | POA: Diagnosis not present

## 2017-06-03 DIAGNOSIS — E785 Hyperlipidemia, unspecified: Secondary | ICD-10-CM

## 2017-06-03 DIAGNOSIS — K449 Diaphragmatic hernia without obstruction or gangrene: Secondary | ICD-10-CM

## 2017-06-03 DIAGNOSIS — R5383 Other fatigue: Secondary | ICD-10-CM

## 2017-06-03 DIAGNOSIS — N400 Enlarged prostate without lower urinary tract symptoms: Secondary | ICD-10-CM

## 2017-06-03 DIAGNOSIS — M069 Rheumatoid arthritis, unspecified: Secondary | ICD-10-CM

## 2017-06-03 DIAGNOSIS — E538 Deficiency of other specified B group vitamins: Secondary | ICD-10-CM

## 2017-06-03 DIAGNOSIS — B0229 Other postherpetic nervous system involvement: Secondary | ICD-10-CM | POA: Diagnosis not present

## 2017-06-03 DIAGNOSIS — K2270 Barrett's esophagus without dysplasia: Secondary | ICD-10-CM

## 2017-06-03 DIAGNOSIS — Z5111 Encounter for antineoplastic chemotherapy: Secondary | ICD-10-CM | POA: Diagnosis not present

## 2017-06-03 DIAGNOSIS — C9110 Chronic lymphocytic leukemia of B-cell type not having achieved remission: Secondary | ICD-10-CM

## 2017-06-03 DIAGNOSIS — D6861 Antiphospholipid syndrome: Secondary | ICD-10-CM

## 2017-06-03 DIAGNOSIS — F17210 Nicotine dependence, cigarettes, uncomplicated: Secondary | ICD-10-CM

## 2017-06-03 DIAGNOSIS — E1151 Type 2 diabetes mellitus with diabetic peripheral angiopathy without gangrene: Secondary | ICD-10-CM

## 2017-06-03 DIAGNOSIS — R161 Splenomegaly, not elsewhere classified: Secondary | ICD-10-CM

## 2017-06-03 DIAGNOSIS — J449 Chronic obstructive pulmonary disease, unspecified: Secondary | ICD-10-CM | POA: Diagnosis not present

## 2017-06-03 DIAGNOSIS — Z5112 Encounter for antineoplastic immunotherapy: Secondary | ICD-10-CM

## 2017-06-03 DIAGNOSIS — Z79899 Other long term (current) drug therapy: Secondary | ICD-10-CM

## 2017-06-03 DIAGNOSIS — I7410 Embolism and thrombosis of unspecified parts of aorta: Secondary | ICD-10-CM

## 2017-06-03 DIAGNOSIS — I2510 Atherosclerotic heart disease of native coronary artery without angina pectoris: Secondary | ICD-10-CM

## 2017-06-03 DIAGNOSIS — I714 Abdominal aortic aneurysm, without rupture: Secondary | ICD-10-CM

## 2017-06-03 DIAGNOSIS — I4891 Unspecified atrial fibrillation: Secondary | ICD-10-CM | POA: Diagnosis not present

## 2017-06-03 DIAGNOSIS — K760 Fatty (change of) liver, not elsewhere classified: Secondary | ICD-10-CM

## 2017-06-03 DIAGNOSIS — R630 Anorexia: Secondary | ICD-10-CM

## 2017-06-03 DIAGNOSIS — Z7982 Long term (current) use of aspirin: Secondary | ICD-10-CM

## 2017-06-03 DIAGNOSIS — I252 Old myocardial infarction: Secondary | ICD-10-CM

## 2017-06-03 DIAGNOSIS — I700 Atherosclerosis of aorta: Secondary | ICD-10-CM

## 2017-06-03 DIAGNOSIS — D3500 Benign neoplasm of unspecified adrenal gland: Secondary | ICD-10-CM

## 2017-06-03 DIAGNOSIS — Z7901 Long term (current) use of anticoagulants: Secondary | ICD-10-CM

## 2017-06-03 LAB — COMPREHENSIVE METABOLIC PANEL
ALBUMIN: 3.9 g/dL (ref 3.5–5.0)
ALK PHOS: 86 U/L (ref 38–126)
ALT: 52 U/L (ref 17–63)
AST: 46 U/L — ABNORMAL HIGH (ref 15–41)
Anion gap: 9 (ref 5–15)
BILIRUBIN TOTAL: 1.5 mg/dL — AB (ref 0.3–1.2)
BUN: 21 mg/dL — ABNORMAL HIGH (ref 6–20)
CALCIUM: 8.8 mg/dL — AB (ref 8.9–10.3)
CO2: 22 mmol/L (ref 22–32)
CREATININE: 0.91 mg/dL (ref 0.61–1.24)
Chloride: 105 mmol/L (ref 101–111)
GFR calc Af Amer: >60 mL/min (ref >60)
GFR calc non Af Amer: >60 mL/min (ref >60)
GLUCOSE: 100 mg/dL — AB (ref 65–99)
Potassium: 4.2 mmol/L (ref 3.5–5.1)
SODIUM: 136 mmol/L (ref 135–145)
TOTAL PROTEIN: 7.3 g/dL (ref 6.5–8.1)

## 2017-06-03 LAB — CBC WITH DIFFERENTIAL/PLATELET
BASOS ABS: 0 10*3/uL (ref 0–0.1)
BASOS PCT: 1 %
EOS ABS: 0 10*3/uL (ref 0–0.7)
Eosinophils Relative: 1 %
HCT: 42.4 % (ref 40.0–52.0)
Hemoglobin: 14.7 g/dL (ref 13.0–18.0)
Lymphocytes Relative: 21 %
Lymphs Abs: 0.7 10*3/uL — ABNORMAL LOW (ref 1.0–3.6)
MCH: 27.6 pg (ref 26.0–34.0)
MCHC: 34.6 g/dL (ref 32.0–36.0)
MCV: 79.8 fL — ABNORMAL LOW (ref 80.0–100.0)
MONO ABS: 0.3 10*3/uL (ref 0.2–1.0)
MONOS PCT: 8 %
Neutro Abs: 2.3 10*3/uL (ref 1.4–6.5)
Neutrophils Relative %: 69 %
PLATELETS: 157 10*3/uL (ref 150–440)
RBC: 5.31 MIL/uL (ref 4.40–5.90)
RDW: 16.8 % — AB (ref 11.5–14.5)
WBC: 3.3 10*3/uL — ABNORMAL LOW (ref 3.8–10.6)

## 2017-06-03 LAB — URIC ACID: Uric Acid, Serum: 3.1 mg/dL — ABNORMAL LOW (ref 4.4–7.6)

## 2017-06-03 LAB — LACTATE DEHYDROGENASE: LDH: 185 U/L (ref 98–192)

## 2017-06-03 MED ORDER — SODIUM CHLORIDE 0.9 % IV SOLN
20.0000 mg | Freq: Once | INTRAVENOUS | Status: AC
  Administered 2017-06-03: 20 mg via INTRAVENOUS
  Filled 2017-06-03: qty 2

## 2017-06-03 MED ORDER — HEPARIN SOD (PORK) LOCK FLUSH 100 UNIT/ML IV SOLN: 500.0000 [IU] | Freq: Once | INTRAVENOUS | Status: DC | PRN

## 2017-06-03 MED ORDER — DIPHENHYDRAMINE HCL 50 MG/ML IJ SOLN
50.0000 mg | Freq: Once | INTRAMUSCULAR | Status: AC
  Administered 2017-06-03: 50 mg via INTRAVENOUS
  Filled 2017-06-03: qty 1

## 2017-06-03 MED ORDER — SODIUM CHLORIDE 0.9 % IV SOLN
Freq: Once | INTRAVENOUS | Status: AC
  Filled 2017-06-03: qty 1000

## 2017-06-03 MED ORDER — SODIUM CHLORIDE 0.9 % IV SOLN
1000.0000 mg | Freq: Once | INTRAVENOUS | Status: AC
  Administered 2017-06-03: 1000 mg via INTRAVENOUS
  Filled 2017-06-03: qty 40

## 2017-06-03 MED ORDER — ACETAMINOPHEN 325 MG PO TABS
650.0000 mg | ORAL_TABLET | Freq: Once | ORAL | Status: AC
  Administered 2017-06-03: 650 mg via ORAL
  Filled 2017-06-03: qty 2

## 2017-06-03 MED ORDER — GABAPENTIN 300 MG PO CAPS: 300.0000 mg | ORAL_CAPSULE | Freq: Three times a day (TID) | ORAL | 0 refills | Status: AC

## 2017-06-03 MED ORDER — SODIUM CHLORIDE 0.9 % IV SOLN
INTRAVENOUS | Status: DC
  Administered 2017-06-03: 10:00:00 via INTRAVENOUS
  Filled 2017-06-03: qty 1000

## 2017-06-03 MED ORDER — SODIUM CHLORIDE 0.9 % IV SOLN
Freq: Once | INTRAVENOUS | Status: AC
  Administered 2017-06-03: 09:00:00 via INTRAVENOUS
  Filled 2017-06-03: qty 1000

## 2017-06-03 MED ORDER — FAMOTIDINE IN NACL 20-0.9 MG/50ML-% IV SOLN
20.0000 mg | Freq: Two times a day (BID) | INTRAVENOUS | Status: DC
  Administered 2017-06-03: 20 mg via INTRAVENOUS
  Filled 2017-06-03: qty 50

## 2017-06-04 NOTE — Progress Notes (Signed)
Hematology/Oncology Follow up note [facility] Telephone:([phone] [phone]   Patient Care Team: [name], MD as PCP - General (Internal Medicine)  REFERRING PROVIDER: [name], MD  REASON FOR VISIT Follow up for immunotherapy management of CLL.  HISTORY OF PRESENTING ILLNESS:  Martin Adkins is a  72 y.o.  male with PMH listed below who was referred to me for evaluation of lymphocytosis.  Recent lab work on 03/05/2017 showed wbc 11.4, hb 14.6, platelet count 117,000, lymphocytosis 63.5%, neutrophil 22%, Report chronic fatigue, no weight loss, fever or chills.  He reports feeling chest "rattleness". Has for CT chest which showed acute finding, chronic finding includes  postsurgical changes consistent with coronary bypass grafting. One of the bypass grafts arising from the aorta shows apparent thrombosis and an area of distal aneurysmal dilatation which is also Thrombosed. Right adrenal adenoma stable in appearance.Mild scarring in the lung bases.Fatty liver.   Takes Aspirin 70m, coumadine, plavix. He is on anticoagulation for previous history of clots.  Denies any bleeding events. Use to drink hard liquir daily, quitted for 4-5 years. Current drinks wine on weekend.    Image Studies # 04/04/2017  [place]dom[place] showed 1.  Splenomegaly.  No focal splenic lesions evident.2. Increased liver echogenicity, a finding most likely indicative of hepatic steatosis. While no focal liver lesions are evident on this study, it must be cautioned that the sensitivity of ultrasound for detection of focal liver lesions is diminished in this circumstance.3. Pancreas obscured by gas. Portions of inferior vena cava obscured by gas.4.  Gallbladder absent.5. Status post abdominal aortic aneurysm repair. No periaortic fluid.  # 04/21/2017 PET scan 1. Moderate splenomegaly with uniform splenic hypermetabolism,compatible with the provided history of  lymphoproliferative disease.No additional hypermetabolic sites of lymphoproliferative disease. Specifically, no hypermetabolic lymphadenopathy Hepatic steatosis, aortic atherosclerosis. Aneurysm.   # # CLL, stage IV disease given spelencemagly, thrombocytopenia, symptomatic with fatigue and weight loss.  CLL IPL score 4, high risk.   INTERVAL HISTORY Martin JRIZWAN KUYPERis a 72y.o. male who has above history reviewed by me today presents for follow up visit forimmunotherapy management of Stage IV CLL, assessment for cycle 1 Day 8 treatment of Gazyva. Patient tolerates Day 1 and Day 2 treatment except mild infusion reaction which gets better after steroids, pepcid and benadryl. He reports feeling better. Report pain at his back where he used to have shingles. Takes Neurontin 3070mBID.    Review of Systems  Constitutional: Positive for malaise/fatigue and weight loss. Negative for chills and fever.  HENT: Negative for congestion, ear discharge, ear pain, nosebleeds, sinus pain and sore throat.   Eyes: Negative for double vision, photophobia, pain, discharge and redness.  Respiratory: Negative for cough, hemoptysis, sputum production, shortness of breath and wheezing.   Cardiovascular: Negative for chest pain, palpitations, orthopnea, claudication and leg swelling.  Gastrointestinal: Negative for abdominal pain, blood in stool, constipation, diarrhea, heartburn, melena, nausea and vomiting.  Genitourinary: Negative for dysuria, flank pain, frequency, hematuria and urgency.  Musculoskeletal: Negative for back pain, myalgias and neck pain.  Skin: Negative for itching and rash.  Neurological: Negative for dizziness, tingling, tremors, speech change, focal weakness, weakness and headaches.  Endo/Heme/Allergies: Negative for environmental allergies. Does not bruise/bleed easily.  Psychiatric/Behavioral: Negative for depression, hallucinations and suicidal ideas. The patient is not nervous/anxious.      MEDICAL HISTORY:  Past Medical History:  Diagnosis Date  . AAA (abdominal aortic aneurysm) without rupture ([place]/2016  .  AAA (abdominal aortic aneurysm) without rupture ([place]) 04/05/2014  . Abdominal aortic aneurysm without rupture ([place])   . Acute non-ST elevation myocardial infarction (NSTEMI) ([place]) 08/15/2014  . 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/28/2014  . APS (antiphospholipid syndrome) ([place])   . Arteriosclerosis of coronary artery 04/05/2014   Overview:  Sp cabg with lima to lad svg to d1 om1 rca 2004 with occluded svg to rca and d1 and pci stetn of om1 graft 2015   . Arthritis   . B12 deficiency 03/21/2017  . Barrett's esophagus   . Benign essential HTN 05/02/2014  . BPH (benign prostatic hyperplasia)   . CAD (coronary artery disease)   . CHF (congestive heart failure) ([place])   . Chronic systolic heart failure ([place]) 04/19/2014   Overview:  With segmental LV systolic dysfunction ejection fraction of 35%   . CLL (chronic lymphocytic leukemia) ([place])   . CLL (chronic lymphocytic leukemia) ([place]) 05/24/2017  . Diabetes ([place])   . DVT (deep venous thrombosis) ([place])   . Dysrhythmia    ATRIAL FIB.  Marland Kitchen GERD (gastroesophageal reflux disease)   . History of hiatal hernia   . History of shingles 2013  . Hyperlipemia   . Hyperplastic polyps of stomach   . Hypertension   . Myocardial infarction ([place]) 07/30/2014  . NSTEMI (non-ST elevated myocardial infarction) ([place])   . OSA (obstructive sleep apnea)   . Osteoarthritis   . PVD (peripheral vascular disease) ([place])   . RA (rheumatoid arthritis) ([place])   . SOB (shortness of breath)     SURGICAL HISTORY: Past Surgical History:  Procedure Laterality Date  . ABDOMINAL AORTA STENT    . CHOLECYSTECTOMY    . COLONOSCOPY  11/24/2009  . CORONARY ANGIOPLASTY WITH STENT PLACEMENT  2015  . CORONARY ARTERY BYPASS GRAFT    . ESOPHAGOGASTRODUODENOSCOPY  05/26/02  03/23/12  . ESOPHAGOGASTRODUODENOSCOPY (EGD) WITH PROPOFOL N/A 10/28/2016   Procedure:  ESOPHAGOGASTRODUODENOSCOPY (EGD) WITH PROPOFOL;  Surgeon: Manya Silvas, MD;  Location: [facility] ENDOSCOPY;  Service: Endoscopy;  Laterality: N/A;  . PERIPHERAL VASCULAR CATHETERIZATION N/A 09/06/2014   Procedure: IVC Filter Removal;  Surgeon: Katha Cabal, MD;  Location: [facility] LAB;  Service: Cardiovascular;  Laterality: N/A;  . TRANSURETHRAL RESECTION OF PROSTATE N/A 02/20/2015   Procedure: TRANSURETHRAL RESECTION OF THE PROSTATE (TURP);  Surgeon: Hollice Espy, MD;  Location: [facility] ORS;  Service: Urology;  Laterality: N/A;    SOCIAL HISTORY: Social History   Socioeconomic History  . Marital status: Legally Separated    Spouse name: Not on file  . Number of children: Not on file  . Years of education: Not on file  . Highest education level: Not on file  Occupational History  . Not on file  Social Needs  . Financial resource strain: Not on file  . Food insecurity:    Worry: Not on file    Inability: Not on file  . Transportation needs:    Medical: Not on file    Non-medical: Not on file  Tobacco Use  . Smoking status: Current Some Day Smoker    Packs/day: 0.25    Years: 20.00    Pack years: 5.00    Types: Cigarettes  . Smokeless tobacco: Never Used  . Tobacco comment: per pt nicotine patch number55m -smokes 1-3 cig/per d now  Substance and Sexual Activity  . Alcohol use: Yes    Alcohol/week: 0.0 oz    Comment: social on weekends  . Drug use: No  . Sexual activity: Never  Lifestyle  . Physical activity:    Days per week: Not on file    Minutes per session: Not on file  . Stress: Not on file  Relationships  . Social connections:    Talks on phone: Not on file    Gets together: Not on file    Attends religious service: Not on file    Active member of club or organization: Not on file    Attends meetings of clubs or organizations: Not on file    Relationship status: Not on file  . Intimate partner violence:    Fear of current or ex partner: Not on file      Emotionally abused: Not on file    Physically abused: Not on file    Forced sexual activity: Not on file  Other Topics Concern  . Not on file  Social History Narrative  . Not on file    FAMILY HISTORY: Family History  Problem Relation Age of Onset  . Cancer Mother 17       breast ca  . Diabetes Mother   . Coronary artery disease Father   . Heart attack Father   . Prostate cancer Brother   . Bladder Cancer Neg Hx   . Kidney cancer Neg Hx     ALLERGIES:  is allergic to ace inhibitors; pravastatin; rosuvastatin; simvastatin; amoxicillin; niacin; and penicillins.  MEDICATIONS:  Current Outpatient Medications  Medication Sig Dispense Refill  . albuterol (PROVENTIL HFA;VENTOLIN HFA) 108 (90 Base) MCG/ACT inhaler Inhale 2 puffs into the lungs every 6 (six) hours as needed for wheezing or shortness of breath. 1 Inhaler 0  . allopurinol (ZYLOPRIM) 300 MG tablet Take 1 tablet (300 mg total) by mouth daily. 30 tablet 0  . amLODipine (NORVASC) 10 MG tablet Take 10 mg by mouth daily.     Marland Kitchen aspirin EC 81 MG tablet Take 81 mg by mouth daily.    Marland Kitchen atorvastatin (LIPITOR) 80 MG tablet TAKE 1 TABLET (80 MG TOTAL) BY MOUTH ONCE DAILY.  11  . docusate sodium (COLACE) 100 MG capsule Take 1 capsule (100 mg total) by mouth 2 (two) times daily. (Patient not taking: Reported on 05/26/2017) 60 capsule 0  . fluticasone (FLONASE) 50 MCG/ACT nasal spray Place 2 sprays into both nostrils daily.     . furosemide (LASIX) 20 MG tablet Take 20 mg by mouth daily.  11  . gabapentin (NEURONTIN) 300 MG capsule Take 1 capsule (300 mg total) by mouth 3 (three) times daily. 90 capsule 0  . hydroxychloroquine (PLAQUENIL) 200 MG tablet Take 1 tablet by mouth daily.    . Ipratropium-Albuterol (COMBIVENT) 20-100 MCG/ACT AERS respimat Inhale 1 puff into the lungs every 6 (six) hours. 1 Inhaler 11  . isosorbide mononitrate (IMDUR) 30 MG 24 hr tablet Take 30 mg by mouth daily.     Marland Kitchen losartan (COZAAR) 100 MG tablet 100 mg  daily.     Phillis Knack EYE DROPS 0.4-0.3 % SOLN Apply 1 drop to eye daily as needed.     . metoprolol succinate (TOPROL-XL) 100 MG 24 hr tablet Take 100 mg by mouth daily.     . montelukast (SINGULAIR) 10 MG tablet Take 1 tablet (10 mg total) by mouth as needed. Take before Gazyva infusion. 20 tablet 0  . pantoprazole (PROTONIX) 40 MG tablet Take 40 mg by mouth daily.     . potassium chloride SA (K-DUR,KLOR-CON) 20 MEQ tablet Take 20 mEq by mouth daily.     . vitamin  B-12 (CYANOCOBALAMIN) 100 MCG tablet Take 1 tablet (100 mcg total) by mouth daily. 90 tablet 1  . warfarin (COUMADIN) 1 MG tablet Take by mouth daily. Patient takes once a week with 74m tablet for a total of 6 mg per day    . warfarin (COUMADIN) 5 MG tablet TAKE 1 TABLET (5 MG TOTAL) BY MOUTH ONCE DAILY.  5   No current facility-administered medications for this visit.      PHYSICAL EXAMINATION: ECOG 1 Vitals:   06/04/17 1641  BP: 113/75  Pulse: 64  Resp: 18  Physical Exam  Constitutional: He is oriented to person, place, and time and well-developed, well-nourished, and in no distress. No distress.  HENT:  Head: Normocephalic and atraumatic.  Nose: Nose normal.  Mouth/Throat: Oropharynx is clear and moist. No oropharyngeal exudate.  Eyes: Pupils are equal, round, and reactive to light. EOM are normal. Left eye exhibits no discharge. No scleral icterus.  Neck: Normal range of motion. Neck supple. No JVD present.  Cardiovascular: Normal rate, regular rhythm and normal heart sounds.  No murmur heard. Pulmonary/Chest: Effort normal. No respiratory distress. He has no wheezes. He has no rales. He exhibits no tenderness.  Abdominal: Soft. Bowel sounds are normal. He exhibits no distension and no mass. There is no tenderness. There is no rebound and no guarding.  splenomegaly  Musculoskeletal: Normal range of motion. He exhibits no edema, tenderness or deformity.  Lymphadenopathy:    He has no cervical adenopathy.    Neurological: He is alert and oriented to person, place, and time. No cranial nerve deficit. He exhibits normal muscle tone. Coordination normal.  Skin: Skin is warm and dry. No rash noted. He is not diaphoretic. No erythema.  Psychiatric: Affect and judgment normal.  Nursing note and vitals reviewed.    LABORATORY DATA:  I have reviewed the data as listed Lab Results  Component Value Date   WBC 3.3 (L) 06/03/2017   HGB 14.7 06/03/2017   HCT 42.4 06/03/2017   MCV 79.8 (L) 06/03/2017   PLT 157 06/03/2017   Recent Labs    04/09/17 1155 05/23/17 0945 05/27/17 0843 06/03/17 0820  NA  --  139 137 136  K  --  3.8 3.9 4.2  CL  --  104 105 105  CO2  --  '24 23 22  ' GLUCOSE  --  131* 128* 100*  BUN  --  19 17 21*  CREATININE  --  1.05 0.98 0.91  CALCIUM  --  8.9 8.9 8.8*  GFRNONAA  --  >60 >60 >60  GFRAA  --  >60 >60 >60  PROT  --  7.5 7.2 7.3  ALBUMIN  --  3.9 3.7 3.9  AST  --  42* 51* 46*  ALT  --  27 35 52  ALKPHOS  --  100 95 86  BILITOT 1.7* 1.9* 1.6* 1.5*  BILIDIR 0.3  --   --   --    Hepatitis panel negative.  LDH 228, mildly elevated Beta 2 microglobulin normal.   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 The morphologic and immunophenotypic features are most consistent with chronic lymphocytic leukemia/small lymphocytic lymphoma. Bone marrow Cytogenetics : normal.   Peripheral blood FISH panel negative for CCND1- IGH mutation, ATM, 12, 13q, Tp53 mutation.   ASSESSMENT & PLAN:  72yo male presents for immunotherapy treatment of Stage IV CLL. 1. CLL (chronic lymphocytic leukemia) ([place]   2. Encounter for antineoplastic  immunotherapy   3. Postherpetic neuralgia    # CLL: tolerates Day 1 and Day 2 treatment, except mild infusion reaction. No signs of tumor lysis syndrome. Uric acid is low. He takes allopurinol.  Proceed with Cycle 15 Day1 Obinutuzumab.  Singulair was previously sent to Pharmacy to be used prior  to infusion.    # Postherpetic neuralgia: increase neurontin 351m to TID. Offered lidocaine cream or patch, patient declined.   All questions were answered. The patient knows to call the clinic with any problems questions or concerns. Total face to face encounter time for this patient visit was 25 min. >50% of the time was  spent in counseling and coordination of care.   Return of visit: Lab and Day 15 of Obinutuzumab treatment in one week.  I will see him in  2 weeks for Lab MD and obinutuzumab.   ZEarlie Server MD, PhD Hematology Oncology [facility] Centerat AWestern Maryland Regional Medical CenterPager- [phone]/29/2019

## 2017-06-06 ENCOUNTER — Encounter: Payer: Self-pay | Admitting: Oncology

## 2017-06-09 ENCOUNTER — Other Ambulatory Visit: Payer: Self-pay

## 2017-06-09 ENCOUNTER — Inpatient Hospital Stay: Payer: Medicare PPO | Attending: Oncology

## 2017-06-09 ENCOUNTER — Inpatient Hospital Stay: Payer: Medicare PPO

## 2017-06-09 VITALS — BP 108/75 | HR 66 | Temp 97.1°F | Resp 18 | Wt 205.4 lb

## 2017-06-09 DIAGNOSIS — Z79899 Other long term (current) drug therapy: Secondary | ICD-10-CM | POA: Insufficient documentation

## 2017-06-09 DIAGNOSIS — D3501 Benign neoplasm of right adrenal gland: Secondary | ICD-10-CM | POA: Diagnosis not present

## 2017-06-09 DIAGNOSIS — I714 Abdominal aortic aneurysm, without rupture: Secondary | ICD-10-CM | POA: Insufficient documentation

## 2017-06-09 DIAGNOSIS — C9110 Chronic lymphocytic leukemia of B-cell type not having achieved remission: Secondary | ICD-10-CM | POA: Diagnosis present

## 2017-06-09 DIAGNOSIS — I5022 Chronic systolic (congestive) heart failure: Secondary | ICD-10-CM | POA: Diagnosis not present

## 2017-06-09 DIAGNOSIS — Z8042 Family history of malignant neoplasm of prostate: Secondary | ICD-10-CM | POA: Diagnosis not present

## 2017-06-09 DIAGNOSIS — I2510 Atherosclerotic heart disease of native coronary artery without angina pectoris: Secondary | ICD-10-CM | POA: Diagnosis not present

## 2017-06-09 DIAGNOSIS — E1151 Type 2 diabetes mellitus with diabetic peripheral angiopathy without gangrene: Secondary | ICD-10-CM | POA: Insufficient documentation

## 2017-06-09 DIAGNOSIS — K219 Gastro-esophageal reflux disease without esophagitis: Secondary | ICD-10-CM | POA: Diagnosis not present

## 2017-06-09 DIAGNOSIS — Z7982 Long term (current) use of aspirin: Secondary | ICD-10-CM | POA: Insufficient documentation

## 2017-06-09 DIAGNOSIS — B0229 Other postherpetic nervous system involvement: Secondary | ICD-10-CM | POA: Insufficient documentation

## 2017-06-09 DIAGNOSIS — D6861 Antiphospholipid syndrome: Secondary | ICD-10-CM | POA: Insufficient documentation

## 2017-06-09 DIAGNOSIS — Z7901 Long term (current) use of anticoagulants: Secondary | ICD-10-CM | POA: Diagnosis not present

## 2017-06-09 DIAGNOSIS — Z5112 Encounter for antineoplastic immunotherapy: Secondary | ICD-10-CM | POA: Diagnosis not present

## 2017-06-09 DIAGNOSIS — D696 Thrombocytopenia, unspecified: Secondary | ICD-10-CM | POA: Insufficient documentation

## 2017-06-09 DIAGNOSIS — I252 Old myocardial infarction: Secondary | ICD-10-CM | POA: Diagnosis not present

## 2017-06-09 DIAGNOSIS — E538 Deficiency of other specified B group vitamins: Secondary | ICD-10-CM | POA: Diagnosis not present

## 2017-06-09 DIAGNOSIS — R161 Splenomegaly, not elsewhere classified: Secondary | ICD-10-CM | POA: Insufficient documentation

## 2017-06-09 DIAGNOSIS — I700 Atherosclerosis of aorta: Secondary | ICD-10-CM | POA: Insufficient documentation

## 2017-06-09 DIAGNOSIS — N400 Enlarged prostate without lower urinary tract symptoms: Secondary | ICD-10-CM | POA: Diagnosis not present

## 2017-06-09 DIAGNOSIS — Z86718 Personal history of other venous thrombosis and embolism: Secondary | ICD-10-CM | POA: Insufficient documentation

## 2017-06-09 DIAGNOSIS — K760 Fatty (change of) liver, not elsewhere classified: Secondary | ICD-10-CM | POA: Insufficient documentation

## 2017-06-09 DIAGNOSIS — I4891 Unspecified atrial fibrillation: Secondary | ICD-10-CM | POA: Insufficient documentation

## 2017-06-09 LAB — COMPREHENSIVE METABOLIC PANEL
ALT: 47 U/L (ref 17–63)
AST: 38 U/L (ref 15–41)
Albumin: 4.1 g/dL (ref 3.5–5.0)
Alkaline Phosphatase: 80 U/L (ref 38–126)
Anion gap: 6 (ref 5–15)
BILIRUBIN TOTAL: 1.7 mg/dL — AB (ref 0.3–1.2)
BUN: 14 mg/dL (ref 6–20)
CO2: 25 mmol/L (ref 22–32)
CREATININE: 0.89 mg/dL (ref 0.61–1.24)
Calcium: 8.7 mg/dL — ABNORMAL LOW (ref 8.9–10.3)
Chloride: 106 mmol/L (ref 101–111)
Glucose, Bld: 106 mg/dL — ABNORMAL HIGH (ref 65–99)
POTASSIUM: 4.2 mmol/L (ref 3.5–5.1)
Sodium: 137 mmol/L (ref 135–145)
TOTAL PROTEIN: 7.2 g/dL (ref 6.5–8.1)

## 2017-06-09 LAB — CBC WITH DIFFERENTIAL/PLATELET
BASOS ABS: 0 10*3/uL (ref 0–0.1)
Basophils Relative: 1 %
EOS ABS: 0 10*3/uL (ref 0–0.7)
Eosinophils Relative: 1 %
HCT: 43.2 % (ref 40.0–52.0)
Hemoglobin: 14.7 g/dL (ref 13.0–18.0)
LYMPHS PCT: 20 %
Lymphs Abs: 0.9 10*3/uL — ABNORMAL LOW (ref 1.0–3.6)
MCH: 27.5 pg (ref 26.0–34.0)
MCHC: 34.1 g/dL (ref 32.0–36.0)
MCV: 80.8 fL (ref 80.0–100.0)
Monocytes Absolute: 0.2 10*3/uL (ref 0.2–1.0)
Monocytes Relative: 5 %
NEUTROS PCT: 73 %
Neutro Abs: 3.3 10*3/uL (ref 1.4–6.5)
Platelets: 139 10*3/uL — ABNORMAL LOW (ref 150–440)
RBC: 5.35 MIL/uL (ref 4.40–5.90)
RDW: 16.8 % — ABNORMAL HIGH (ref 11.5–14.5)
WBC: 4.5 10*3/uL (ref 3.8–10.6)

## 2017-06-09 LAB — URIC ACID: Uric Acid, Serum: 5.3 mg/dL (ref 4.4–7.6)

## 2017-06-09 MED ORDER — FAMOTIDINE IN NACL 20-0.9 MG/50ML-% IV SOLN
20.0000 mg | Freq: Two times a day (BID) | INTRAVENOUS | Status: DC
  Administered 2017-06-09: 20 mg via INTRAVENOUS
  Filled 2017-06-09: qty 50

## 2017-06-09 MED ORDER — DIPHENHYDRAMINE HCL 50 MG/ML IJ SOLN
50.0000 mg | Freq: Once | INTRAMUSCULAR | Status: AC
  Administered 2017-06-09: 50 mg via INTRAVENOUS
  Filled 2017-06-09: qty 1

## 2017-06-09 MED ORDER — ACETAMINOPHEN 325 MG PO TABS
650.0000 mg | ORAL_TABLET | Freq: Once | ORAL | Status: AC
  Administered 2017-06-09: 650 mg via ORAL
  Filled 2017-06-09: qty 2

## 2017-06-09 MED ORDER — SODIUM CHLORIDE 0.9 % IV SOLN
20.0000 mg | Freq: Once | INTRAVENOUS | Status: AC
  Administered 2017-06-09: 20 mg via INTRAVENOUS
  Filled 2017-06-09: qty 2

## 2017-06-09 MED ORDER — SODIUM CHLORIDE 0.9 % IV SOLN
1000.0000 mg | Freq: Once | INTRAVENOUS | Status: AC
  Administered 2017-06-09: 1000 mg via INTRAVENOUS
  Filled 2017-06-09: qty 40

## 2017-06-23 ENCOUNTER — Inpatient Hospital Stay (HOSPITAL_BASED_OUTPATIENT_CLINIC_OR_DEPARTMENT_OTHER): Payer: Medicare PPO | Admitting: Oncology

## 2017-06-23 ENCOUNTER — Encounter: Payer: Self-pay | Admitting: Oncology

## 2017-06-23 ENCOUNTER — Inpatient Hospital Stay: Payer: Medicare PPO

## 2017-06-23 ENCOUNTER — Other Ambulatory Visit: Payer: Self-pay

## 2017-06-23 VITALS — BP 110/67 | HR 67 | Temp 95.8°F | Resp 18

## 2017-06-23 VITALS — BP 124/81 | HR 87 | Temp 97.5°F | Resp 16 | Wt 205.3 lb

## 2017-06-23 DIAGNOSIS — C9190 Lymphoid leukemia, unspecified not having achieved remission: Secondary | ICD-10-CM

## 2017-06-23 DIAGNOSIS — D696 Thrombocytopenia, unspecified: Secondary | ICD-10-CM

## 2017-06-23 DIAGNOSIS — I5022 Chronic systolic (congestive) heart failure: Secondary | ICD-10-CM

## 2017-06-23 DIAGNOSIS — Z86718 Personal history of other venous thrombosis and embolism: Secondary | ICD-10-CM

## 2017-06-23 DIAGNOSIS — K760 Fatty (change of) liver, not elsewhere classified: Secondary | ICD-10-CM | POA: Diagnosis not present

## 2017-06-23 DIAGNOSIS — I714 Abdominal aortic aneurysm, without rupture: Secondary | ICD-10-CM

## 2017-06-23 DIAGNOSIS — I4891 Unspecified atrial fibrillation: Secondary | ICD-10-CM

## 2017-06-23 DIAGNOSIS — R161 Splenomegaly, not elsewhere classified: Secondary | ICD-10-CM | POA: Diagnosis not present

## 2017-06-23 DIAGNOSIS — D6861 Antiphospholipid syndrome: Secondary | ICD-10-CM | POA: Diagnosis not present

## 2017-06-23 DIAGNOSIS — E538 Deficiency of other specified B group vitamins: Secondary | ICD-10-CM

## 2017-06-23 DIAGNOSIS — I700 Atherosclerosis of aorta: Secondary | ICD-10-CM | POA: Diagnosis not present

## 2017-06-23 DIAGNOSIS — N400 Enlarged prostate without lower urinary tract symptoms: Secondary | ICD-10-CM

## 2017-06-23 DIAGNOSIS — B0229 Other postherpetic nervous system involvement: Secondary | ICD-10-CM

## 2017-06-23 DIAGNOSIS — I252 Old myocardial infarction: Secondary | ICD-10-CM | POA: Diagnosis not present

## 2017-06-23 DIAGNOSIS — Z5112 Encounter for antineoplastic immunotherapy: Secondary | ICD-10-CM | POA: Diagnosis not present

## 2017-06-23 DIAGNOSIS — I2510 Atherosclerotic heart disease of native coronary artery without angina pectoris: Secondary | ICD-10-CM | POA: Diagnosis not present

## 2017-06-23 DIAGNOSIS — K219 Gastro-esophageal reflux disease without esophagitis: Secondary | ICD-10-CM

## 2017-06-23 DIAGNOSIS — C9110 Chronic lymphocytic leukemia of B-cell type not having achieved remission: Secondary | ICD-10-CM

## 2017-06-23 DIAGNOSIS — Z7901 Long term (current) use of anticoagulants: Secondary | ICD-10-CM

## 2017-06-23 DIAGNOSIS — E1151 Type 2 diabetes mellitus with diabetic peripheral angiopathy without gangrene: Secondary | ICD-10-CM

## 2017-06-23 DIAGNOSIS — R5383 Other fatigue: Secondary | ICD-10-CM

## 2017-06-23 DIAGNOSIS — Z8042 Family history of malignant neoplasm of prostate: Secondary | ICD-10-CM

## 2017-06-23 DIAGNOSIS — Z79899 Other long term (current) drug therapy: Secondary | ICD-10-CM

## 2017-06-23 DIAGNOSIS — D3501 Benign neoplasm of right adrenal gland: Secondary | ICD-10-CM

## 2017-06-23 DIAGNOSIS — Z7982 Long term (current) use of aspirin: Secondary | ICD-10-CM

## 2017-06-23 LAB — COMPREHENSIVE METABOLIC PANEL
ALK PHOS: 95 U/L (ref 38–126)
ALT: 19 U/L (ref 17–63)
ANION GAP: 7 (ref 5–15)
AST: 30 U/L (ref 15–41)
Albumin: 4.1 g/dL (ref 3.5–5.0)
BILIRUBIN TOTAL: 1.5 mg/dL — AB (ref 0.3–1.2)
BUN: 15 mg/dL (ref 6–20)
CALCIUM: 8.9 mg/dL (ref 8.9–10.3)
CO2: 22 mmol/L (ref 22–32)
CREATININE: 0.81 mg/dL (ref 0.61–1.24)
Chloride: 109 mmol/L (ref 101–111)
Glucose, Bld: 150 mg/dL — ABNORMAL HIGH (ref 65–99)
Potassium: 4 mmol/L (ref 3.5–5.1)
SODIUM: 138 mmol/L (ref 135–145)
TOTAL PROTEIN: 7.6 g/dL (ref 6.5–8.1)

## 2017-06-23 LAB — CBC WITH DIFFERENTIAL/PLATELET
BASOS ABS: 0.1 10*3/uL (ref 0–0.1)
BASOS PCT: 1 %
EOS ABS: 0.1 10*3/uL (ref 0–0.7)
Eosinophils Relative: 1 %
HEMATOCRIT: 44.5 % (ref 40.0–52.0)
HEMOGLOBIN: 15.2 g/dL (ref 13.0–18.0)
Lymphocytes Relative: 15 %
Lymphs Abs: 0.8 10*3/uL — ABNORMAL LOW (ref 1.0–3.6)
MCH: 27.9 pg (ref 26.0–34.0)
MCHC: 34 g/dL (ref 32.0–36.0)
MCV: 81.8 fL (ref 80.0–100.0)
MONOS PCT: 4 %
Monocytes Absolute: 0.2 10*3/uL (ref 0.2–1.0)
NEUTROS ABS: 4.3 10*3/uL (ref 1.4–6.5)
NEUTROS PCT: 79 %
Platelets: 151 10*3/uL (ref 150–440)
RBC: 5.44 MIL/uL (ref 4.40–5.90)
RDW: 17.6 % — ABNORMAL HIGH (ref 11.5–14.5)
WBC: 5.4 10*3/uL (ref 3.8–10.6)

## 2017-06-23 LAB — URIC ACID: Uric Acid, Serum: 2.9 mg/dL — ABNORMAL LOW (ref 4.4–7.6)

## 2017-06-23 MED ORDER — DIPHENHYDRAMINE HCL 50 MG/ML IJ SOLN
50.0000 mg | Freq: Once | INTRAMUSCULAR | Status: AC
  Administered 2017-06-23: 50 mg via INTRAVENOUS
  Filled 2017-06-23: qty 1

## 2017-06-23 MED ORDER — ACETAMINOPHEN 325 MG PO TABS
650.0000 mg | ORAL_TABLET | Freq: Once | ORAL | Status: AC
  Administered 2017-06-23: 650 mg via ORAL
  Filled 2017-06-23: qty 2

## 2017-06-23 MED ORDER — SODIUM CHLORIDE 0.9 % IV SOLN
Freq: Once | INTRAVENOUS | Status: AC
  Administered 2017-06-23: 10:00:00 via INTRAVENOUS
  Filled 2017-06-23: qty 1000

## 2017-06-23 MED ORDER — SODIUM CHLORIDE 0.9 % IV SOLN
20.0000 mg | Freq: Once | INTRAVENOUS | Status: AC
  Administered 2017-06-23: 20 mg via INTRAVENOUS
  Filled 2017-06-23: qty 2

## 2017-06-23 MED ORDER — FAMOTIDINE IN NACL 20-0.9 MG/50ML-% IV SOLN
20.0000 mg | Freq: Two times a day (BID) | INTRAVENOUS | Status: DC
  Administered 2017-06-23: 20 mg via INTRAVENOUS
  Filled 2017-06-23: qty 50

## 2017-06-23 MED ORDER — SODIUM CHLORIDE 0.9 % IV SOLN
1000.0000 mg | Freq: Once | INTRAVENOUS | Status: AC
  Administered 2017-06-23: 1000 mg via INTRAVENOUS
  Filled 2017-06-23: qty 40

## 2017-06-23 NOTE — Progress Notes (Signed)
Hematology/Oncology Follow up note [facility] [phone] [phone]   Patient Care Team: [name], MD as PCP - General (Internal Medicine) Earlie Server, MD as Medical Oncologist (Medical Oncology)  REFERRING PROVIDER: [name], MD  REASON FOR VISIT Follow up for immunotherapy management of CLL.  HISTORY OF PRESENTING ILLNESS:  Martin Adkins is a  72 y.o.  male with PMH listed below who was referred to me for evaluation of lymphocytosis.  Recent lab work on 03/05/2017 showed wbc 11.4, hb 14.6, platelet count 117,000, lymphocytosis 63.5%, neutrophil 22%, Report chronic fatigue, no weight loss, fever or chills.  He reports feeling chest "rattleness". Has for CT chest which showed acute finding, chronic finding includes  postsurgical changes consistent with coronary bypass grafting. One of the bypass grafts arising from the aorta shows apparent thrombosis and an area of distal aneurysmal dilatation which is also Thrombosed. Right adrenal adenoma stable in appearance.Mild scarring in the lung bases.Fatty liver.   Takes Aspirin 90m, coumadine, plavix. He is on anticoagulation for previous history of clots.  Denies any bleeding events. Use to drink hard liquir daily, quitted for 4-5 years. Current drinks wine on weekend.    Image Studies # 04/04/2017  [place]dom[place] showed 1.  Splenomegaly.  No focal splenic lesions evident.2. Increased liver echogenicity, a finding most likely indicative of hepatic steatosis. While no focal liver lesions are evident on this study, it must be cautioned that the sensitivity of ultrasound for detection of focal liver lesions is diminished in this circumstance.3. Pancreas obscured by gas. Portions of inferior vena cava obscured by gas.4.  Gallbladder absent.5. Status post abdominal aortic aneurysm repair. No periaortic fluid.  # 04/21/2017 PET scan 1. Moderate splenomegaly with uniform splenic  hypermetabolism,compatible with the provided history of lymphoproliferative disease.No additional hypermetabolic sites of lymphoproliferative disease. Specifically, no hypermetabolic lymphadenopathy Hepatic steatosis, aortic atherosclerosis. Aneurysm.   # # CLL, stage IV disease given spelencemagly, thrombocytopenia, symptomatic with fatigue and weight loss.  CLL IPL score 4, high risk.   INTERVAL HISTORY Martin JABE SCHOOLSis a 72y.o. male who has above presented for assessment prior to immunotherapy treatment for stage IV CLL. Patient tolerates immunotherapy well.  Fatigue: Still has fatigue however subjectively feels better.  Although still not at his baseline. Chronic cough and shortness of breath reports sleep is slightly better.:  Weight loss; stable    Review of Systems  Constitutional: Positive for malaise/fatigue. Negative for chills, fever and weight loss.  HENT: Negative for congestion, ear discharge, ear pain, nosebleeds, sinus pain and sore throat.   Eyes: Negative for double vision, photophobia, pain, discharge and redness.  Respiratory: Negative for cough, hemoptysis, sputum production, shortness of breath and wheezing.   Cardiovascular: Negative for chest pain, palpitations, orthopnea, claudication and leg swelling.  Gastrointestinal: Negative for abdominal pain, blood in stool, constipation, diarrhea, heartburn, melena, nausea and vomiting.  Genitourinary: Negative for dysuria, flank pain, frequency, hematuria and urgency.  Musculoskeletal: Negative for back pain, myalgias and neck pain.  Skin: Negative for itching and rash.  Neurological: Negative for dizziness, tingling, tremors, speech change, focal weakness, weakness and headaches.  Endo/Heme/Allergies: Negative for environmental allergies. Does not bruise/bleed easily.  Psychiatric/Behavioral: Negative for depression, hallucinations and suicidal ideas. The patient is not nervous/anxious.     MEDICAL HISTORY:  Past  Medical History:  Diagnosis Date  . AAA (abdominal aortic aneurysm) without rupture ([place] 04/05/2014  . AAA (abdominal aortic aneurysm) without rupture ([place] 04/05/2014  . Abdominal  aortic aneurysm without rupture ([place])   . Acute non-ST elevation myocardial infarction (NSTEMI) ([place]) 08/15/2014  . 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/28/2014  . APS (antiphospholipid syndrome) ([place])   . Arteriosclerosis of coronary artery 04/05/2014   Overview:  Sp cabg with lima to lad svg to d1 om1 rca 2004 with occluded svg to rca and d1 and pci stetn of om1 graft 2015   . Arthritis   . B12 deficiency 03/21/2017  . Barrett's esophagus   . Benign essential HTN 05/02/2014  . BPH (benign prostatic hyperplasia)   . CAD (coronary artery disease)   . CHF (congestive heart failure) ([place])   . Chronic systolic heart failure ([place]) 04/19/2014   Overview:  With segmental LV systolic dysfunction ejection fraction of 35%   . CLL (chronic lymphocytic leukemia) ([place])   . CLL (chronic lymphocytic leukemia) ([place]) 05/24/2017  . Diabetes ([place])   . DVT (deep venous thrombosis) ([place])   . Dysrhythmia    ATRIAL FIB.  Marland Kitchen GERD (gastroesophageal reflux disease)   . History of hiatal hernia   . History of shingles 2013  . Hyperlipemia   . Hyperplastic polyps of stomach   . Hypertension   . Myocardial infarction ([place]) 07/30/2014  . NSTEMI (non-ST elevated myocardial infarction) ([place])   . OSA (obstructive sleep apnea)   . Osteoarthritis   . PVD (peripheral vascular disease) ([place])   . RA (rheumatoid arthritis) ([place])   . SOB (shortness of breath)     SURGICAL HISTORY: Past Surgical History:  Procedure Laterality Date  . ABDOMINAL AORTA STENT    . CHOLECYSTECTOMY    . COLONOSCOPY  11/24/2009  . CORONARY ANGIOPLASTY WITH STENT PLACEMENT  2015  . CORONARY ARTERY BYPASS GRAFT    . ESOPHAGOGASTRODUODENOSCOPY  05/26/02  03/23/12  . ESOPHAGOGASTRODUODENOSCOPY (EGD) WITH PROPOFOL N/A 10/28/2016   Procedure: ESOPHAGOGASTRODUODENOSCOPY  (EGD) WITH PROPOFOL;  Surgeon: Manya Silvas, MD;  Location: [facility] ENDOSCOPY;  Service: Endoscopy;  Laterality: N/A;  . PERIPHERAL VASCULAR CATHETERIZATION N/A 09/06/2014   Procedure: IVC Filter Removal;  Surgeon: Katha Cabal, MD;  Location: [facility] CV LAB;  Service: Cardiovascular;  Laterality: N/A;  . TRANSURETHRAL RESECTION OF PROSTATE N/A 02/20/2015   Procedure: TRANSURETHRAL RESECTION OF THE PROSTATE (TURP);  Surgeon: Hollice Espy, MD;  Location: [facility] ORS;  Service: Urology;  Laterality: N/A;    SOCIAL HISTORY: Social History   Socioeconomic History  . Marital status: Legally Separated    Spouse name: Not on file  . Number of children: Not on file  . Years of education: Not on file  . Highest education level: Not on file  Occupational History  . Not on file  Social Needs  . Financial resource strain: Not on file  . Food insecurity:    Worry: Not on file    Inability: Not on file  . Transportation needs:    Medical: Not on file    Non-medical: Not on file  Tobacco Use  . Smoking status: Current Some Day Smoker    Packs/day: 0.25    Years: 20.00    Pack years: 5.00    Types: Cigarettes  . Smokeless tobacco: Never Used  . Tobacco comment: per pt nicotine patch number43m -smokes 1-3 cig/per d now  Substance and Sexual Activity  . Alcohol use: Yes    Alcohol/week: 0.0 oz    Comment: social on weekends  . Drug use: No  . Sexual activity: Never  Lifestyle  . Physical activity:    Days per week:  Not on file    Minutes per session: Not on file  . Stress: Not on file  Relationships  . Social connections:    Talks on phone: Not on file    Gets together: Not on file    Attends religious service: Not on file    Active member of club or organization: Not on file    Attends meetings of clubs or organizations: Not on file    Relationship status: Not on file  . Intimate partner violence:    Fear of current or ex partner: Not on file    Emotionally abused: Not  on file    Physically abused: Not on file    Forced sexual activity: Not on file  Other Topics Concern  . Not on file  Social History Narrative  . Not on file    FAMILY HISTORY: Family History  Problem Relation Age of Onset  . Cancer Mother 44       breast ca  . Diabetes Mother   . Coronary artery disease Father   . Heart attack Father   . Prostate cancer Brother   . Bladder Cancer Neg Hx   . Kidney cancer Neg Hx     ALLERGIES:  is allergic to ace inhibitors; pravastatin; rosuvastatin; simvastatin; amoxicillin; niacin; and penicillins.  MEDICATIONS:  Current Outpatient Medications  Medication Sig Dispense Refill  . albuterol (PROVENTIL HFA;VENTOLIN HFA) 108 (90 Base) MCG/ACT inhaler Inhale 2 puffs into the lungs every 6 (six) hours as needed for wheezing or shortness of breath. 1 Inhaler 0  . allopurinol (ZYLOPRIM) 300 MG tablet Take 1 tablet (300 mg total) by mouth daily. 30 tablet 0  . amLODipine (NORVASC) 10 MG tablet Take 10 mg by mouth daily.     Marland Kitchen aspirin EC 81 MG tablet Take 81 mg by mouth daily.    Marland Kitchen atorvastatin (LIPITOR) 80 MG tablet TAKE 1 TABLET (80 MG TOTAL) BY MOUTH ONCE DAILY.  11  . docusate sodium (COLACE) 100 MG capsule Take 1 capsule (100 mg total) by mouth 2 (two) times daily. (Patient not taking: Reported on 05/26/2017) 60 capsule 0  . fluticasone (FLONASE) 50 MCG/ACT nasal spray Place 2 sprays into both nostrils daily.     . furosemide (LASIX) 20 MG tablet Take 20 mg by mouth daily.  11  . gabapentin (NEURONTIN) 300 MG capsule Take 1 capsule (300 mg total) by mouth 3 (three) times daily. 90 capsule 0  . hydroxychloroquine (PLAQUENIL) 200 MG tablet Take 1 tablet by mouth daily.    . Ipratropium-Albuterol (COMBIVENT) 20-100 MCG/ACT AERS respimat Inhale 1 puff into the lungs every 6 (six) hours. 1 Inhaler 11  . isosorbide mononitrate (IMDUR) 30 MG 24 hr tablet Take 30 mg by mouth daily.     Marland Kitchen losartan (COZAAR) 100 MG tablet 100 mg daily.     Phillis Knack EYE  DROPS 0.4-0.3 % SOLN Apply 1 drop to eye daily as needed.     . metoprolol succinate (TOPROL-XL) 100 MG 24 hr tablet Take 100 mg by mouth daily.     . montelukast (SINGULAIR) 10 MG tablet Take 1 tablet (10 mg total) by mouth as needed. Take before Gazyva infusion. 20 tablet 0  . pantoprazole (PROTONIX) 40 MG tablet Take 40 mg by mouth daily.     . potassium chloride SA (K-DUR,KLOR-CON) 20 MEQ tablet Take 20 mEq by mouth daily.     . vitamin B-12 (CYANOCOBALAMIN) 100 MCG tablet Take 1 tablet (100 mcg total) by  mouth daily. 90 tablet 1  . warfarin (COUMADIN) 1 MG tablet Take by mouth daily. Patient takes once a week with 85m tablet for a total of 6 mg per day    . warfarin (COUMADIN) 5 MG tablet TAKE 1 TABLET (5 MG TOTAL) BY MOUTH ONCE DAILY.  5   No current facility-administered medications for this visit.      PHYSICAL EXAMINATION: ECOG 1 Vitals:   06/23/17 0910  BP: 124/81  Pulse: 87  Resp: 16  Temp: (!) 97.5 F (36.4 C)  SpO2: 97%  Physical Exam  Constitutional: He is oriented to person, place, and time and well-developed, well-nourished, and in no distress. No distress.  HENT:  Head: Normocephalic and atraumatic.  Nose: Nose normal.  Mouth/Throat: Oropharynx is clear and moist. No oropharyngeal exudate.  Eyes: Pupils are equal, round, and reactive to light. EOM are normal. Left eye exhibits no discharge. No scleral icterus.  Neck: Normal range of motion. Neck supple. No JVD present.  Cardiovascular: Normal rate, regular rhythm and normal heart sounds.  No murmur heard. Pulmonary/Chest: Effort normal and breath sounds normal. No respiratory distress. He has no wheezes. He has no rales. He exhibits no tenderness.  Abdominal: Soft. Bowel sounds are normal. He exhibits no distension and no mass. There is no tenderness. There is no rebound and no guarding.  splenomegaly  Musculoskeletal: Normal range of motion. He exhibits no edema, tenderness or deformity.  Lymphadenopathy:    He  has no cervical adenopathy.  Neurological: He is alert and oriented to person, place, and time. No cranial nerve deficit. He exhibits normal muscle tone. Coordination normal.  Skin: Skin is warm and dry. No rash noted. He is not diaphoretic. No erythema.  Psychiatric: Affect and judgment normal.  Nursing note and vitals reviewed.    LABORATORY DATA:  I have reviewed the data as listed Lab Results  Component Value Date   WBC 5.4 06/23/2017   HGB 15.2 06/23/2017   HCT 44.5 06/23/2017   MCV 81.8 06/23/2017   PLT 151 06/23/2017   Recent Labs    04/09/17 1155  06/03/17 0820 06/09/17 0837 06/23/17 0803  NA  --    < > 136 137 138  K  --    < > 4.2 4.2 4.0  CL  --    < > 105 106 109  CO2  --    < > '22 25 22  ' GLUCOSE  --    < > 100* 106* 150*  BUN  --    < > 21* 14 15  CREATININE  --    < > 0.91 0.89 0.81  CALCIUM  --    < > 8.8* 8.7* 8.9  GFRNONAA  --    < > >60 >60 >60  GFRAA  --    < > >60 >60 >60  PROT  --    < > 7.3 7.2 7.6  ALBUMIN  --    < > 3.9 4.1 4.1  AST  --    < > 46* 38 30  ALT  --    < > 52 47 19  ALKPHOS  --    < > 86 80 95  BILITOT 1.7*   < > 1.5* 1.7* 1.5*  BILIDIR 0.3  --   --   --   --    < > = values in this interval not displayed.   Hepatitis panel negative.  LDH 228, mildly elevated Beta 2 microglobulin normal.  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 The morphologic and immunophenotypic features are most consistent with chronic lymphocytic leukemia/small lymphocytic lymphoma. Bone marrow Cytogenetics : normal.   Peripheral blood FISH panel negative for CCND1- IGH mutation, ATM, 12, 13q, Tp53 mutation.   ASSESSMENT & PLAN:  72 yo male presents for immunotherapy treatment of Stage IV CLL. 1. CLL (chronic lymphocytic leukemia) ([place])   2. Encounter for antineoplastic immunotherapy   3. Other fatigue   4. Postherpetic neuralgia    # CLL: D1 and D2, day 15 treatment, except mild infusion reaction  on day 1.    labs reviewed.  No signs of tumor lysis syndrome.  Uric acid remained low.  I advised patient to stop taking allopurinol. Proceed with cycle 2-day 1 obinutuzumab.  Singulair was previously sent to pharmacy to be used prior to infusion.  #Fatigue; improved.  Continue monitor.  Anticipate fatigue to continue to improve with treatment.  # Postherpetic neuralgia: Neurontin was increased to 300 mg 3 times a day.  Tolerating.  Improved   All questions were answered. The patient knows to call the clinic with any problems questions or concerns.  Return of visit: CBC CMP in 4 weeks prior to assessment for cycle 3  Earlie Server, MD, PhD Hematology Oncology [facility] at [facility] Pager- [phone] 06/23/2017

## 2017-06-23 NOTE — Progress Notes (Signed)
Patient here today for follow up.  Patient states no new concerns today  

## 2017-07-21 ENCOUNTER — Inpatient Hospital Stay (HOSPITAL_BASED_OUTPATIENT_CLINIC_OR_DEPARTMENT_OTHER): Payer: Medicare PPO | Admitting: Oncology

## 2017-07-21 ENCOUNTER — Telehealth: Payer: Self-pay | Admitting: Pharmacy Technician

## 2017-07-21 ENCOUNTER — Telehealth: Payer: Self-pay | Admitting: Pharmacist

## 2017-07-21 ENCOUNTER — Other Ambulatory Visit: Payer: Self-pay

## 2017-07-21 ENCOUNTER — Inpatient Hospital Stay: Payer: Medicare PPO | Attending: Oncology

## 2017-07-21 ENCOUNTER — Encounter: Payer: Self-pay | Admitting: Oncology

## 2017-07-21 ENCOUNTER — Inpatient Hospital Stay: Payer: Medicare PPO

## 2017-07-21 VITALS — BP 121/81 | HR 73

## 2017-07-21 VITALS — BP 128/87 | HR 80 | Temp 97.1°F | Resp 18 | Wt 205.7 lb

## 2017-07-21 DIAGNOSIS — R161 Splenomegaly, not elsewhere classified: Secondary | ICD-10-CM | POA: Insufficient documentation

## 2017-07-21 DIAGNOSIS — F17210 Nicotine dependence, cigarettes, uncomplicated: Secondary | ICD-10-CM | POA: Insufficient documentation

## 2017-07-21 DIAGNOSIS — I2510 Atherosclerotic heart disease of native coronary artery without angina pectoris: Secondary | ICD-10-CM | POA: Diagnosis not present

## 2017-07-21 DIAGNOSIS — Z7901 Long term (current) use of anticoagulants: Secondary | ICD-10-CM | POA: Insufficient documentation

## 2017-07-21 DIAGNOSIS — E1151 Type 2 diabetes mellitus with diabetic peripheral angiopathy without gangrene: Secondary | ICD-10-CM | POA: Insufficient documentation

## 2017-07-21 DIAGNOSIS — I4891 Unspecified atrial fibrillation: Secondary | ICD-10-CM

## 2017-07-21 DIAGNOSIS — D72810 Lymphocytopenia: Secondary | ICD-10-CM

## 2017-07-21 DIAGNOSIS — Z79899 Other long term (current) drug therapy: Secondary | ICD-10-CM

## 2017-07-21 DIAGNOSIS — I252 Old myocardial infarction: Secondary | ICD-10-CM | POA: Insufficient documentation

## 2017-07-21 DIAGNOSIS — D3501 Benign neoplasm of right adrenal gland: Secondary | ICD-10-CM | POA: Insufficient documentation

## 2017-07-21 DIAGNOSIS — Z5112 Encounter for antineoplastic immunotherapy: Secondary | ICD-10-CM | POA: Insufficient documentation

## 2017-07-21 DIAGNOSIS — M069 Rheumatoid arthritis, unspecified: Secondary | ICD-10-CM | POA: Insufficient documentation

## 2017-07-21 DIAGNOSIS — I714 Abdominal aortic aneurysm, without rupture: Secondary | ICD-10-CM

## 2017-07-21 DIAGNOSIS — B0229 Other postherpetic nervous system involvement: Secondary | ICD-10-CM | POA: Insufficient documentation

## 2017-07-21 DIAGNOSIS — R251 Tremor, unspecified: Secondary | ICD-10-CM | POA: Insufficient documentation

## 2017-07-21 DIAGNOSIS — E538 Deficiency of other specified B group vitamins: Secondary | ICD-10-CM | POA: Diagnosis not present

## 2017-07-21 DIAGNOSIS — K769 Liver disease, unspecified: Secondary | ICD-10-CM

## 2017-07-21 DIAGNOSIS — G4733 Obstructive sleep apnea (adult) (pediatric): Secondary | ICD-10-CM

## 2017-07-21 DIAGNOSIS — I7419 Embolism and thrombosis of other parts of aorta: Secondary | ICD-10-CM | POA: Diagnosis not present

## 2017-07-21 DIAGNOSIS — C9110 Chronic lymphocytic leukemia of B-cell type not having achieved remission: Secondary | ICD-10-CM

## 2017-07-21 DIAGNOSIS — R05 Cough: Secondary | ICD-10-CM | POA: Diagnosis not present

## 2017-07-21 DIAGNOSIS — R0602 Shortness of breath: Secondary | ICD-10-CM | POA: Insufficient documentation

## 2017-07-21 DIAGNOSIS — D696 Thrombocytopenia, unspecified: Secondary | ICD-10-CM

## 2017-07-21 DIAGNOSIS — Z7982 Long term (current) use of aspirin: Secondary | ICD-10-CM

## 2017-07-21 DIAGNOSIS — R530 Neoplastic (malignant) related fatigue: Secondary | ICD-10-CM

## 2017-07-21 DIAGNOSIS — C9190 Lymphoid leukemia, unspecified not having achieved remission: Secondary | ICD-10-CM | POA: Insufficient documentation

## 2017-07-21 DIAGNOSIS — I5022 Chronic systolic (congestive) heart failure: Secondary | ICD-10-CM

## 2017-07-21 DIAGNOSIS — N400 Enlarged prostate without lower urinary tract symptoms: Secondary | ICD-10-CM | POA: Insufficient documentation

## 2017-07-21 DIAGNOSIS — E785 Hyperlipidemia, unspecified: Secondary | ICD-10-CM | POA: Insufficient documentation

## 2017-07-21 DIAGNOSIS — R634 Abnormal weight loss: Secondary | ICD-10-CM | POA: Diagnosis not present

## 2017-07-21 DIAGNOSIS — K219 Gastro-esophageal reflux disease without esophagitis: Secondary | ICD-10-CM | POA: Insufficient documentation

## 2017-07-21 LAB — COMPREHENSIVE METABOLIC PANEL
ALT: 22 U/L (ref 0–44)
AST: 36 U/L (ref 15–41)
Albumin: 4.4 g/dL (ref 3.5–5.0)
Alkaline Phosphatase: 89 U/L (ref 38–126)
Anion gap: 8 (ref 5–15)
BILIRUBIN TOTAL: 1.6 mg/dL — AB (ref 0.3–1.2)
BUN: 17 mg/dL (ref 8–23)
CO2: 23 mmol/L (ref 22–32)
CREATININE: 0.93 mg/dL (ref 0.61–1.24)
Calcium: 8.5 mg/dL — ABNORMAL LOW (ref 8.9–10.3)
Chloride: 107 mmol/L (ref 98–111)
GFR calc Af Amer: >60 mL/min (ref >60)
Glucose, Bld: 150 mg/dL — ABNORMAL HIGH (ref 70–99)
Potassium: 4.1 mmol/L (ref 3.5–5.1)
Sodium: 138 mmol/L (ref 135–145)
TOTAL PROTEIN: 7.7 g/dL (ref 6.5–8.1)

## 2017-07-21 LAB — CBC WITH DIFFERENTIAL/PLATELET
Basophils Absolute: 0 10*3/uL (ref 0–0.1)
Basophils Relative: 1 %
EOS ABS: 0.1 10*3/uL (ref 0–0.7)
Eosinophils Relative: 1 %
HCT: 43.6 % (ref 40.0–52.0)
Hemoglobin: 15 g/dL (ref 13.0–18.0)
LYMPHS ABS: 0.8 10*3/uL — AB (ref 1.0–3.6)
Lymphocytes Relative: 19 %
MCH: 28.3 pg (ref 26.0–34.0)
MCHC: 34.4 g/dL (ref 32.0–36.0)
MCV: 82 fL (ref 80.0–100.0)
Monocytes Absolute: 0.3 10*3/uL (ref 0.2–1.0)
Monocytes Relative: 8 %
Neutro Abs: 2.9 10*3/uL (ref 1.4–6.5)
Neutrophils Relative %: 71 %
Platelets: 154 10*3/uL (ref 150–440)
RBC: 5.31 MIL/uL (ref 4.40–5.90)
RDW: 18.8 % — AB (ref 11.5–14.5)
WBC: 4.2 10*3/uL (ref 3.8–10.6)

## 2017-07-21 LAB — URIC ACID: Uric Acid, Serum: 5.4 mg/dL (ref 3.7–8.6)

## 2017-07-21 MED ORDER — ALLOPURINOL 300 MG PO TABS: 300.0000 mg | ORAL_TABLET | Freq: Every day | ORAL | 0 refills | Status: DC

## 2017-07-21 MED ORDER — FAMOTIDINE IN NACL 20-0.9 MG/50ML-% IV SOLN
20.0000 mg | Freq: Two times a day (BID) | INTRAVENOUS | Status: DC
  Administered 2017-07-21: 20 mg via INTRAVENOUS
  Filled 2017-07-21: qty 50

## 2017-07-21 MED ORDER — VENETOCLAX 10 & 50 & 100 MG PO TBPK: ORAL_TABLET | ORAL | 0 refills | Status: DC

## 2017-07-21 MED ORDER — SODIUM CHLORIDE 0.9 % IV SOLN
Freq: Once | INTRAVENOUS | Status: AC
  Administered 2017-07-21: 10:00:00 via INTRAVENOUS
  Filled 2017-07-21: qty 1000

## 2017-07-21 MED ORDER — DIPHENHYDRAMINE HCL 50 MG/ML IJ SOLN
50.0000 mg | Freq: Once | INTRAMUSCULAR | Status: AC
  Administered 2017-07-21: 50 mg via INTRAVENOUS
  Filled 2017-07-21: qty 1

## 2017-07-21 MED ORDER — ACETAMINOPHEN 325 MG PO TABS
650.0000 mg | ORAL_TABLET | Freq: Once | ORAL | Status: AC
  Administered 2017-07-21: 650 mg via ORAL
  Filled 2017-07-21: qty 2

## 2017-07-21 MED ORDER — DEXAMETHASONE SODIUM PHOSPHATE 100 MG/10ML IJ SOLN
20.0000 mg | Freq: Once | INTRAMUSCULAR | Status: AC
  Administered 2017-07-21: 20 mg via INTRAVENOUS
  Filled 2017-07-21: qty 2

## 2017-07-21 MED ORDER — HEPARIN SOD (PORK) LOCK FLUSH 100 UNIT/ML IV SOLN
500.0000 [IU] | Freq: Once | INTRAVENOUS | Status: DC | PRN
Start: 2017-07-21 — End: 2017-07-21
  Filled 2017-07-21: qty 5

## 2017-07-21 MED ORDER — SODIUM CHLORIDE 0.9 % IV SOLN
1000.0000 mg | Freq: Once | INTRAVENOUS | Status: AC
  Administered 2017-07-21: 1000 mg via INTRAVENOUS
  Filled 2017-07-21: qty 40

## 2017-07-21 NOTE — Telephone Encounter (Signed)
Oral Chemotherapy Pharmacist Encounter  Patient Education I spoke with patient in infusion following his office visit for overview of new oral chemotherapy medication: Venetoclax (Venclexta) for the treatment of CLL, planned duration until disease progression or unacceptable drug toxicity.   Pt is doing well. Counseled patient on administration, dosing, side effects, monitoring, drug-food interactions, safe handling, storage, and disposal. Patient will take 20mg  by mouth daily for week one, 50mg  daily for week 2, 100mg  daily for week 3, 200mg  daily for week 4, then 400mg  daily thereafter.  Side effects include but not limited to: decreased WBC/Hgb/plt, N/V/D, fatigue.    Reviewed with patient importance of keeping a medication schedule and plan for any missed doses.  [name] voiced understanding and appreciation. All questions answered. Medication handout provided and consent obtained.  Provided patient with [facility] phone number. Patient knows to call the office with questions or concerns. Oral Chemotherapy Navigation Clinic will continue to follow.  Darl Pikes, PharmD, BCPS, [facility] Hematology/Oncology Clinical Pharmacist [facility] [phone]  07/21/2017 3:02 PM

## 2017-07-21 NOTE — Telephone Encounter (Signed)
Oral Oncology Patient Advocate Encounter  Received notification from [facility] that prior authorization for Venclexta is required.  PA submitted on CoverMyMeds Key [id number] Status is pending   Oral Oncology Clinic will continue to follow.  [place] Patient [place] Phone [phone] Fax [phone] 07/21/2017 3:03 PM

## 2017-07-21 NOTE — Progress Notes (Signed)
Patient here for follow up. Pt states that for the last week he has been feeling a little bit of tightness in his chest, no pain. States he feels short of breath at times. Denies dizziness or lightheadedness.

## 2017-07-21 NOTE — Progress Notes (Signed)
Hematology/Oncology Follow up note [facility] Telephone:([phone] Fax:(336) [phone]   Patient Care Team: [name], MD as PCP - General (Internal Medicine) Earlie Server, MD as Medical Oncologist (Medical Oncology)  REFERRING PROVIDER: [name], MD  REASON FOR VISIT Follow up for management of CLL HISTORY OF PRESENTING ILLNESS:  Martin Adkins is a  72 y.o.  male with PMH listed below who was referred to me for evaluation of lymphocytosis.  Recent lab work on 03/05/2017 showed wbc 11.4, hb 14.6, platelet count 117,000, lymphocytosis 63.5%, neutrophil 22%, Report chronic fatigue, no weight loss, fever or chills.  He reports feeling chest "rattleness". Has for CT chest which showed acute finding, chronic finding includes  postsurgical changes consistent with coronary bypass grafting. One of the bypass grafts arising from the aorta shows apparent thrombosis and an area of distal aneurysmal dilatation which is also Thrombosed. Right adrenal adenoma stable in appearance.Mild scarring in the lung bases.Fatty liver.   Takes Aspirin 22m, coumadine, plavix. He is on anticoagulation for previous history of clots.  Denies any bleeding events. Use to drink hard liquir daily, quitted for 4-5 years. Current drinks wine on weekend.    Image Studies # 04/04/2017  [place] showed 1.  Splenomegaly.  No focal splenic lesions evident.2. Increased liver echogenicity, a finding most likely indicative of hepatic steatosis. While no focal liver lesions are evident on this study, it must be cautioned that the sensitivity of ultrasound for detection of focal liver lesions is diminished in this circumstance.3. Pancreas obscured by gas. Portions of inferior vena cava obscured by gas.4.  Gallbladder absent.5. Status post abdominal aortic aneurysm repair. No periaortic fluid.  # 04/21/2017 PET scan 1. Moderate splenomegaly with uniform splenic hypermetabolism,compatible  with the provided history of lymphoproliferative disease.No additional hypermetabolic sites of lymphoproliferative disease. Specifically, no hypermetabolic lymphadenopathy Hepatic steatosis, aortic atherosclerosis. Aneurysm.    # CLL, stage IV disease given spelencemagly, thrombocytopenia, symptomatic with fatigue and weight loss.  CLL IPL score 4, high risk.   INTERVAL HISTORY Martin JCOBIN CADAVIDis a 72y.o. male who has above presented for assessment prior to immunotherapy treatment for stage IV CLL. Patient reports tolerating immunotherapy well. Fatigue, feels better.  Although he feels is not at his baseline yet. Chronic cough any shortness of breath, stable. Weight loss;  his weight has been stable Reports hand tremor recently.  Worsen when he wants to reach out.  No alleviating factors.  Not associated with any focal weakness, speech problem, focal weakness.  Review of Systems  Constitutional: Positive for malaise/fatigue. Negative for chills, fever and weight loss.  HENT: Negative for congestion, ear discharge, ear pain, nosebleeds, sinus pain and sore throat.   Eyes: Negative for double vision, photophobia, pain, discharge and redness.  Respiratory: Negative for cough, hemoptysis, sputum production, shortness of breath and wheezing.   Cardiovascular: Negative for chest pain, palpitations, orthopnea, claudication and leg swelling.  Gastrointestinal: Negative for abdominal pain, blood in stool, constipation, diarrhea, heartburn, melena, nausea and vomiting.  Genitourinary: Negative for dysuria, flank pain, frequency, hematuria and urgency.  Musculoskeletal: Negative for back pain, myalgias and neck pain.  Skin: Negative for itching and rash.  Neurological: Negative for dizziness, tingling, tremors, speech change, focal weakness, weakness and headaches.  Endo/Heme/Allergies: Negative for environmental allergies. Does not bruise/bleed easily.  Psychiatric/Behavioral: Negative for  depression, hallucinations and suicidal ideas. The patient is not nervous/anxious.     MEDICAL HISTORY:  Past Medical History:  Diagnosis Date  .  AAA (abdominal aortic aneurysm) without rupture ([place]) 04/05/2014  . AAA (abdominal aortic aneurysm) without rupture ([place]) 04/05/2014  . Abdominal aortic aneurysm without rupture ([place])   . Acute non-ST elevation myocardial infarction (NSTEMI) ([place]) 08/15/2014  . 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/28/2014  . APS (antiphospholipid syndrome) ([place])   . Arteriosclerosis of coronary artery 04/05/2014   Overview:  Sp cabg with lima to lad svg to d1 om1 rca 2004 with occluded svg to rca and d1 and pci stetn of om1 graft 2015   . Arthritis   . B12 deficiency 03/21/2017  . Barrett's esophagus   . Benign essential HTN 05/02/2014  . BPH (benign prostatic hyperplasia)   . CAD (coronary artery disease)   . CHF (congestive heart failure) ([place])   . Chronic systolic heart failure ([place]) 04/19/2014   Overview:  With segmental LV systolic dysfunction ejection fraction of 35%   . CLL (chronic lymphocytic leukemia) ([place])   . CLL (chronic lymphocytic leukemia) ([place]) 05/24/2017  . Diabetes ([place])   . DVT (deep venous thrombosis) ([place])   . Dysrhythmia    ATRIAL FIB.  Marland Kitchen GERD (gastroesophageal reflux disease)   . History of hiatal hernia   . History of shingles 2013  . Hyperlipemia   . Hyperplastic polyps of stomach   . Hypertension   . Myocardial infarction ([place]) 07/30/2014  . NSTEMI (non-ST elevated myocardial infarction) ([place])   . OSA (obstructive sleep apnea)   . Osteoarthritis   . PVD (peripheral vascular disease) ([place])   . RA (rheumatoid arthritis) ([place])   . SOB (shortness of breath)     SURGICAL HISTORY: Past Surgical History:  Procedure Laterality Date  . ABDOMINAL AORTA STENT    . CHOLECYSTECTOMY    . COLONOSCOPY  11/24/2009  . CORONARY ANGIOPLASTY WITH STENT PLACEMENT  2015  . CORONARY ARTERY BYPASS GRAFT    . ESOPHAGOGASTRODUODENOSCOPY  05/26/02   03/23/12  . ESOPHAGOGASTRODUODENOSCOPY (EGD) WITH PROPOFOL N/A 10/28/2016   Procedure: ESOPHAGOGASTRODUODENOSCOPY (EGD) WITH PROPOFOL;  Surgeon: Manya Silvas, MD;  Location: [facility] ENDOSCOPY;  Service: Endoscopy;  Laterality: N/A;  . PERIPHERAL VASCULAR CATHETERIZATION N/A 09/06/2014   Procedure: IVC Filter Removal;  Surgeon: Katha Cabal, MD;  Location: [place] CV LAB;  Service: Cardiovascular;  Laterality: N/A;  . TRANSURETHRAL RESECTION OF PROSTATE N/A 02/20/2015   Procedure: TRANSURETHRAL RESECTION OF THE PROSTATE (TURP);  Surgeon: Hollice Espy, MD;  Location: [facility] ORS;  Service: Urology;  Laterality: N/A;    SOCIAL HISTORY: Social History   Socioeconomic History  . Marital status: Legally Separated    Spouse name: Not on file  . Number of children: Not on file  . Years of education: Not on file  . Highest education level: Not on file  Occupational History  . Not on file  Social Needs  . Financial resource strain: Not on file  . Food insecurity:    Worry: Not on file    Inability: Not on file  . Transportation needs:    Medical: Not on file    Non-medical: Not on file  Tobacco Use  . Smoking status: Current Some Day Smoker    Packs/day: 0.25    Years: 20.00    Pack years: 5.00    Types: Cigarettes  . Smokeless tobacco: Never Used  . Tobacco comment: per pt nicotine patch number46m -smokes 1-3 cig/per d now  Substance and Sexual Activity  . Alcohol use: Yes    Alcohol/week: 0.0 oz    Comment: social on weekends  .  Drug use: No  . Sexual activity: Never  Lifestyle  . Physical activity:    Days per week: Not on file    Minutes per session: Not on file  . Stress: Not on file  Relationships  . Social connections:    Talks on phone: Not on file    Gets together: Not on file    Attends religious service: Not on file    Active member of club or organization: Not on file    Attends meetings of clubs or organizations: Not on file    Relationship status:  Not on file  . Intimate partner violence:    Fear of current or ex partner: Not on file    Emotionally abused: Not on file    Physically abused: Not on file    Forced sexual activity: Not on file  Other Topics Concern  . Not on file  Social History Narrative  . Not on file    FAMILY HISTORY: Family History  Problem Relation Age of Onset  . Cancer Mother 71       breast ca  . Diabetes Mother   . Coronary artery disease Father   . Heart attack Father   . Prostate cancer Brother   . Bladder Cancer Neg Hx   . Kidney cancer Neg Hx     ALLERGIES:  is allergic to ace inhibitors; pravastatin; rosuvastatin; simvastatin; amoxicillin; niacin; and penicillins.  MEDICATIONS:  Current Outpatient Medications  Medication Sig Dispense Refill  . albuterol (PROVENTIL HFA;VENTOLIN HFA) 108 (90 Base) MCG/ACT inhaler Inhale 2 puffs into the lungs every 6 (six) hours as needed for wheezing or shortness of breath. 1 Inhaler 0  . amLODipine (NORVASC) 10 MG tablet Take 10 mg by mouth daily.     Marland Kitchen aspirin EC 81 MG tablet Take 81 mg by mouth daily.    Marland Kitchen atorvastatin (LIPITOR) 80 MG tablet TAKE 1 TABLET (80 MG TOTAL) BY MOUTH ONCE DAILY.  11  . docusate sodium (COLACE) 100 MG capsule Take 1 capsule (100 mg total) by mouth 2 (two) times daily. 60 capsule 0  . fluticasone (FLONASE) 50 MCG/ACT nasal spray Place 2 sprays into both nostrils daily.     . furosemide (LASIX) 20 MG tablet Take 20 mg by mouth daily.  11  . gabapentin (NEURONTIN) 300 MG capsule Take 1 capsule (300 mg total) by mouth 3 (three) times daily. 90 capsule 0  . hydroxychloroquine (PLAQUENIL) 200 MG tablet Take 1 tablet by mouth daily.    . Ipratropium-Albuterol (COMBIVENT) 20-100 MCG/ACT AERS respimat Inhale 1 puff into the lungs every 6 (six) hours. 1 Inhaler 11  . isosorbide mononitrate (IMDUR) 30 MG 24 hr tablet Take 30 mg by mouth daily.     Marland Kitchen losartan (COZAAR) 100 MG tablet 100 mg daily.     Phillis Knack EYE DROPS 0.4-0.3 % SOLN  Apply 1 drop to eye daily as needed.     . metoprolol succinate (TOPROL-XL) 100 MG 24 hr tablet Take 100 mg by mouth daily.     . montelukast (SINGULAIR) 10 MG tablet Take 1 tablet (10 mg total) by mouth as needed. Take before Gazyva infusion. 20 tablet 0  . pantoprazole (PROTONIX) 40 MG tablet Take 40 mg by mouth daily.     . potassium chloride SA (K-DUR,KLOR-CON) 20 MEQ tablet Take 20 mEq by mouth daily.     . vitamin B-12 (CYANOCOBALAMIN) 100 MCG tablet Take 1 tablet (100 mcg total) by mouth daily. 90 tablet 1  .  warfarin (COUMADIN) 1 MG tablet Take by mouth daily. Patient takes once a week with 71m tablet for a total of 6 mg per day    . warfarin (COUMADIN) 5 MG tablet TAKE 1 TABLET (5 MG TOTAL) BY MOUTH ONCE DAILY.  5   No current facility-administered medications for this visit.      PHYSICAL EXAMINATION: ECOG 1 Vitals:   07/21/17 0838 07/21/17 0842  BP: 128/87   Pulse: 80   Resp: 18   Temp: (!) 97.1 F (36.2 C)   SpO2:  98%  Physical Exam  Constitutional: He is oriented to person, place, and time and well-developed, well-nourished, and in no distress. No distress.  HENT:  Head: Normocephalic and atraumatic.  Nose: Nose normal.  Mouth/Throat: Oropharynx is clear and moist. No oropharyngeal exudate.  Eyes: Pupils are equal, round, and reactive to light. EOM are normal. Left eye exhibits no discharge. No scleral icterus.  Neck: Normal range of motion. Neck supple. No JVD present.  Cardiovascular: Normal rate, regular rhythm and normal heart sounds.  No murmur heard. Pulmonary/Chest: Effort normal and breath sounds normal. No respiratory distress. He has no wheezes. He has no rales. He exhibits no tenderness.  Abdominal: Soft. Bowel sounds are normal. He exhibits no distension and no mass. There is no tenderness. There is no rebound and no guarding.  splenomegaly  Musculoskeletal: Normal range of motion. He exhibits no edema, tenderness or deformity.  Lymphadenopathy:    He  has no cervical adenopathy.  Neurological: He is alert and oriented to person, place, and time. No cranial nerve deficit. He exhibits normal muscle tone. Coordination normal.  Skin: Skin is warm and dry. No rash noted. He is not diaphoretic. No erythema.  Psychiatric: Affect and judgment normal.  Nursing note and vitals reviewed.    LABORATORY DATA:  I have reviewed the data as listed Lab Results  Component Value Date   WBC 4.2 07/21/2017   HGB 15.0 07/21/2017   HCT 43.6 07/21/2017   MCV 82.0 07/21/2017   PLT 154 07/21/2017   Recent Labs    04/09/17 1155  06/03/17 0820 06/09/17 0837 06/23/17 0803  NA  --    < > 136 137 138  K  --    < > 4.2 4.2 4.0  CL  --    < > 105 106 109  CO2  --    < > _0 GLUCOSE  --    < > 100* 106* 150*  BUN  --    < > 21* 14 15  CREATININE  --    < > 0.91 0.89 0.81  CALCIUM  --    < > 8.8* 8.7* 8.9  GFRNONAA  --    < > >60 >60 >60  GFRAA  --    < > >60 >60 >60  PROT  --    < > 7.3 7.2 7.6  ALBUMIN  --    < > 3.9 4.1 4.1  AST  --    < > 46* 38 30  ALT  --    < > 52 47 19  ALKPHOS  --    < > 86 80 95  BILITOT 1.7*   < > 1.5* 1.7* 1.5*  BILIDIR 0.3  --   --   --   --    < > = values in this interval not displayed.   Hepatitis panel negative.  LDH 228, mildly elevated Beta 2 microglobulin normal.  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 The morphologic and immunophenotypic features are most consistent with chronic lymphocytic leukemia/small lymphocytic lymphoma. Bone marrow Cytogenetics : normal.   Peripheral blood FISH panel negative for CCND1- IGH mutation, ATM, 12, 13q, Tp53 mutation.   ASSESSMENT & PLAN:  72 yo male presents for immunotherapy treatment of Stage IV CLL. 1. CLL (chronic lymphocytic leukemia) ([place])   2. Encounter for antineoplastic immunotherapy   3. Tremor   4. Neoplastic malignant related fatigue   5. Lymphopenia    # CLL: Labs reviewed, acceptable to  proceed cycle 3 obinutuzumab treatment. Discussed with patient about adding Veneteclax as a combination treatment. Rationale and side effects including but not limited to low blood counts, decreased immunity, fatigue, rash, infection, tumor lysis syndrome discussed with patient. He voices understanding and agrees with plan.  I have discussed with pharmacist Allyson. Will schedule outpatient lab encounter and hydration encounter. Weekly labs and encourage oral hydration.  Patient is not currently on Allopurinol. Advise patient to start Allopurinol 2-3 days prior to starting Veneteclax.    #Fatigue; stable. Continue monitor.  # Postherpetic neuralgia: Stable. Continue Neurontin 300 mg 3 times a day.   # Hand Tremor, likely essential tremor. Advise patient to follow up with PCP for further evaluation.  # Lymphopenia, likely due to obinotuzumab treatment. Continue monitor.   All questions were answered. The patient knows to call the clinic with any problems questions or concerns.  Return of visit: CBC CMP in 4 weeks prior to assessment for cycle 4.  Total face to face encounter time for this patient visit was 40 min. >50% of the time was  spent in counseling and coordination of care.  Earlie Server, MD, PhD Hematology Oncology [facility] at [facility] Pager- [phone] 07/21/2017

## 2017-07-21 NOTE — Telephone Encounter (Signed)
Oral Oncology Pharmacist Encounter  Received new prescription for Venclexta (venetoclax) for the treatment of CLL in conjunction with obinutuzumab, planned duration until disease progression or unacceptable drug toxicity.  CBC from 07/21/17 assessed, no relevant lab abnormalities. Prescription dose and frequency assessed.   Current medication list in Epic reviewed, one relevant DDIs with venetoclax identified: Venetoclax may increase the serum concentration of Warfarin. Venetoclax can increase the AUC of R- and S-warfarin enantiomers bu 28%. Recommend increased monitoring of INR. INR is being monitored by the Cardiology [facility] clinic.   Prescription has been e-scribed to the [facility] for benefits analysis and approval.  Oral Oncology Clinic will continue to follow for insurance authorization, copayment issues, initial counseling and start date.  Darl Pikes, PharmD, BCPS, [facility] Hematology/Oncology Clinical Pharmacist [facility] [place] [facility] [phone]  07/21/2017 10:22 AM

## 2017-07-23 NOTE — Telephone Encounter (Signed)
Oral Oncology Pharmacist Encounter   Prior Authorization for Venclexta Starting Pack has been approved.     Effective dates: 07/23/17 through 01/23/18   Oral Oncology Clinic will continue to follow.   Darl Pikes, PharmD, BCPS. BCOP Hematology/Oncology Clinical Pharmacist [facility] [place] [facility] [phone]  07/23/2017 1:06 PM

## 2017-07-24 ENCOUNTER — Telehealth: Payer: Self-pay | Admitting: Pharmacy Technician

## 2017-07-24 NOTE — Telephone Encounter (Signed)
Oral Oncology Patient Advocate Encounter  Patient approved for 2 grants:  1 - Was successful in securing patient an $10 grant from [employer]) to provide copayment coverage for his Venclexta.  This will keep the out of pocket expense at $0.     The billing information is as follows and has been shared with [place].   Member ID: [id number] Group ID: [id number] RxBin: [id number] Dates of Eligibility: 04/25/17 through 07/24/18    2 - Was successful in securing patient a $8000 grant from [employer] to provide copayment coverage for Rafael Gonzalez.This will keep the out of pocket expense at $0.   The billing information is as follows and has been shared with [place].   Member ID: [id number] Group ID: [id number] RxBin: [id number] Dates of Eligibility: 06/24/17 through 06/24/18  [place]iza Patient [place] Phone [phone] Fax [phone] 07/24/2017 9:21 AM

## 2017-07-28 ENCOUNTER — Other Ambulatory Visit: Payer: Self-pay | Admitting: Oncology

## 2017-07-28 DIAGNOSIS — C9110 Chronic lymphocytic leukemia of B-cell type not having achieved remission: Secondary | ICD-10-CM

## 2017-07-29 ENCOUNTER — Telehealth: Payer: Self-pay | Admitting: Oncology

## 2017-07-29 NOTE — Telephone Encounter (Signed)
Called patient to notify him about upcoming appointment(8/5) and schedule. Patient acknowledged understanding and requested appointment reminder to be mailed.

## 2017-08-05 ENCOUNTER — Telehealth: Payer: Self-pay | Admitting: Pharmacist

## 2017-08-05 ENCOUNTER — Other Ambulatory Visit: Payer: Self-pay | Admitting: Oncology

## 2017-08-05 DIAGNOSIS — C9110 Chronic lymphocytic leukemia of B-cell type not having achieved remission: Secondary | ICD-10-CM

## 2017-08-05 NOTE — Telephone Encounter (Signed)
Oral Chemotherapy Pharmacist Encounter   Martin Adkins will be starting venetoclax on Monday 08/11/17, in preparation for this he will need to restart his allopurinol on Friday 08/08/17. Call [name] and informed him. He stated his understanding and stated he had medication handy to start on Friday.   Darl Pikes, PharmD, BCPS, BCOP Hematology/Oncology Clinical Pharmacist [facility] [place] [facility] [phone]  08/05/2017 11:30 AM

## 2017-08-06 ENCOUNTER — Telehealth: Payer: Self-pay | Admitting: Pharmacist

## 2017-08-06 MED FILL — VENCLEXTA STARTING PACK: 10 & 50 & 1 | 28 days supply | Qty: 42 | Fill #0

## 2017-08-06 NOTE — Telephone Encounter (Signed)
Oral Chemotherapy Pharmacist Encounter   Venetoclax may increase the serum concentration of Warfarin. Venetoclax can increase the AUC of R- and S-warfarin enantiomers bu 28%. Recommend increased monitoring of INR. INR is being monitored by the Cardiology [facility] clinic.   Called Cardiology [place] clinic and spoke with Claiborne Billings, RN for Dr. Serafina Royals and informed for of the drug interaction and suggested they increase the frequency of his monitoring following the start of Mr. Scharnhorst venetoclax on 08/11/17. Explained that the dose to the venetoclax will be increased weekly until week 5 when he gets to goal dose.    Darl Pikes, PharmD, BCPS, [facility] Hematology/Oncology Clinical Pharmacist [facility] [place] [facility] [phone]  08/06/2017 10:11 AM

## 2017-08-11 ENCOUNTER — Inpatient Hospital Stay: Payer: Medicare PPO | Attending: Oncology

## 2017-08-11 ENCOUNTER — Encounter: Payer: Self-pay | Admitting: Oncology

## 2017-08-11 ENCOUNTER — Other Ambulatory Visit: Payer: Self-pay

## 2017-08-11 ENCOUNTER — Telehealth: Payer: Self-pay | Admitting: Pharmacist

## 2017-08-11 ENCOUNTER — Inpatient Hospital Stay: Payer: Medicare PPO

## 2017-08-11 ENCOUNTER — Inpatient Hospital Stay (HOSPITAL_BASED_OUTPATIENT_CLINIC_OR_DEPARTMENT_OTHER): Payer: Medicare PPO | Admitting: Oncology

## 2017-08-11 VITALS — BP 117/79 | HR 85 | Temp 97.4°F | Resp 18 | Wt 208.7 lb

## 2017-08-11 DIAGNOSIS — R918 Other nonspecific abnormal finding of lung field: Secondary | ICD-10-CM

## 2017-08-11 DIAGNOSIS — I4891 Unspecified atrial fibrillation: Secondary | ICD-10-CM

## 2017-08-11 DIAGNOSIS — N400 Enlarged prostate without lower urinary tract symptoms: Secondary | ICD-10-CM | POA: Insufficient documentation

## 2017-08-11 DIAGNOSIS — R634 Abnormal weight loss: Secondary | ICD-10-CM | POA: Insufficient documentation

## 2017-08-11 DIAGNOSIS — M129 Arthropathy, unspecified: Secondary | ICD-10-CM | POA: Diagnosis not present

## 2017-08-11 DIAGNOSIS — K219 Gastro-esophageal reflux disease without esophagitis: Secondary | ICD-10-CM

## 2017-08-11 DIAGNOSIS — B0229 Other postherpetic nervous system involvement: Secondary | ICD-10-CM | POA: Insufficient documentation

## 2017-08-11 DIAGNOSIS — Z86718 Personal history of other venous thrombosis and embolism: Secondary | ICD-10-CM | POA: Insufficient documentation

## 2017-08-11 DIAGNOSIS — I714 Abdominal aortic aneurysm, without rupture: Secondary | ICD-10-CM | POA: Diagnosis not present

## 2017-08-11 DIAGNOSIS — E119 Type 2 diabetes mellitus without complications: Secondary | ICD-10-CM

## 2017-08-11 DIAGNOSIS — Z5112 Encounter for antineoplastic immunotherapy: Secondary | ICD-10-CM | POA: Diagnosis present

## 2017-08-11 DIAGNOSIS — R161 Splenomegaly, not elsewhere classified: Secondary | ICD-10-CM

## 2017-08-11 DIAGNOSIS — E538 Deficiency of other specified B group vitamins: Secondary | ICD-10-CM | POA: Diagnosis not present

## 2017-08-11 DIAGNOSIS — Z5111 Encounter for antineoplastic chemotherapy: Secondary | ICD-10-CM

## 2017-08-11 DIAGNOSIS — I739 Peripheral vascular disease, unspecified: Secondary | ICD-10-CM

## 2017-08-11 DIAGNOSIS — R05 Cough: Secondary | ICD-10-CM | POA: Insufficient documentation

## 2017-08-11 DIAGNOSIS — M069 Rheumatoid arthritis, unspecified: Secondary | ICD-10-CM

## 2017-08-11 DIAGNOSIS — G4730 Sleep apnea, unspecified: Secondary | ICD-10-CM | POA: Insufficient documentation

## 2017-08-11 DIAGNOSIS — Z7982 Long term (current) use of aspirin: Secondary | ICD-10-CM

## 2017-08-11 DIAGNOSIS — I2510 Atherosclerotic heart disease of native coronary artery without angina pectoris: Secondary | ICD-10-CM

## 2017-08-11 DIAGNOSIS — I700 Atherosclerosis of aorta: Secondary | ICD-10-CM

## 2017-08-11 DIAGNOSIS — R5383 Other fatigue: Secondary | ICD-10-CM | POA: Insufficient documentation

## 2017-08-11 DIAGNOSIS — I10 Essential (primary) hypertension: Secondary | ICD-10-CM | POA: Diagnosis not present

## 2017-08-11 DIAGNOSIS — Z8042 Family history of malignant neoplasm of prostate: Secondary | ICD-10-CM | POA: Insufficient documentation

## 2017-08-11 DIAGNOSIS — C9110 Chronic lymphocytic leukemia of B-cell type not having achieved remission: Secondary | ICD-10-CM

## 2017-08-11 DIAGNOSIS — D72810 Lymphocytopenia: Secondary | ICD-10-CM | POA: Diagnosis not present

## 2017-08-11 DIAGNOSIS — I252 Old myocardial infarction: Secondary | ICD-10-CM | POA: Diagnosis not present

## 2017-08-11 DIAGNOSIS — R0602 Shortness of breath: Secondary | ICD-10-CM

## 2017-08-11 DIAGNOSIS — Z86010 Personal history of colonic polyps: Secondary | ICD-10-CM | POA: Insufficient documentation

## 2017-08-11 DIAGNOSIS — E785 Hyperlipidemia, unspecified: Secondary | ICD-10-CM | POA: Diagnosis not present

## 2017-08-11 DIAGNOSIS — F17210 Nicotine dependence, cigarettes, uncomplicated: Secondary | ICD-10-CM | POA: Insufficient documentation

## 2017-08-11 DIAGNOSIS — K760 Fatty (change of) liver, not elsewhere classified: Secondary | ICD-10-CM | POA: Diagnosis not present

## 2017-08-11 DIAGNOSIS — D3501 Benign neoplasm of right adrenal gland: Secondary | ICD-10-CM | POA: Insufficient documentation

## 2017-08-11 DIAGNOSIS — Z803 Family history of malignant neoplasm of breast: Secondary | ICD-10-CM

## 2017-08-11 LAB — COMPREHENSIVE METABOLIC PANEL
ALBUMIN: 3.9 g/dL (ref 3.5–5.0)
ALK PHOS: 82 U/L (ref 38–126)
ALK PHOS: 82 U/L (ref 38–126)
ALT: 23 U/L (ref 0–44)
ALT: 20 U/L (ref 0–44)
ANION GAP: 8 (ref 5–15)
ANION GAP: 6 (ref 5–15)
AST: 32 U/L (ref 15–41)
AST: 29 U/L (ref 15–41)
Albumin: 4.1 g/dL (ref 3.5–5.0)
BILIRUBIN TOTAL: 1.5 mg/dL — AB (ref 0.3–1.2)
BUN: 19 mg/dL (ref 8–23)
BUN: 18 mg/dL (ref 8–23)
CALCIUM: 8.9 mg/dL (ref 8.9–10.3)
CALCIUM: 8.3 mg/dL — AB (ref 8.9–10.3)
CO2: 24 mmol/L (ref 22–32)
CO2: 23 mmol/L (ref 22–32)
Chloride: 108 mmol/L (ref 98–111)
Chloride: 108 mmol/L (ref 98–111)
Creatinine, Ser: 0.76 mg/dL (ref 0.61–1.24)
Creatinine, Ser: 0.83 mg/dL (ref 0.61–1.24)
GFR calc Af Amer: >60 mL/min (ref >60)
GFR calc Af Amer: >60 mL/min (ref >60)
GFR calc non Af Amer: >60 mL/min (ref >60)
GFR calc non Af Amer: >60 mL/min (ref >60)
GLUCOSE: 117 mg/dL — AB (ref 70–99)
GLUCOSE: 106 mg/dL — AB (ref 70–99)
Potassium: 3.7 mmol/L (ref 3.5–5.1)
Potassium: 4.1 mmol/L (ref 3.5–5.1)
Sodium: 140 mmol/L (ref 135–145)
Sodium: 137 mmol/L (ref 135–145)
TOTAL PROTEIN: 7 g/dL (ref 6.5–8.1)
Total Bilirubin: 1.6 mg/dL — ABNORMAL HIGH (ref 0.3–1.2)
Total Protein: 7.6 g/dL (ref 6.5–8.1)

## 2017-08-11 LAB — CBC WITH DIFFERENTIAL/PLATELET
BASOS PCT: 1 %
Basophils Absolute: 0 10*3/uL (ref 0–0.1)
EOS ABS: 0.1 10*3/uL (ref 0–0.7)
EOS PCT: 1 %
HCT: 43.5 % (ref 40.0–52.0)
HEMOGLOBIN: 14.9 g/dL (ref 13.0–18.0)
LYMPHS ABS: 0.9 10*3/uL — AB (ref 1.0–3.6)
Lymphocytes Relative: 22 %
MCH: 28.4 pg (ref 26.0–34.0)
MCHC: 34.3 g/dL (ref 32.0–36.0)
MCV: 82.8 fL (ref 80.0–100.0)
MONO ABS: 0.3 10*3/uL (ref 0.2–1.0)
Monocytes Relative: 8 %
Neutro Abs: 2.7 10*3/uL (ref 1.4–6.5)
Neutrophils Relative %: 68 %
PLATELETS: 136 10*3/uL — AB (ref 150–440)
RBC: 5.25 MIL/uL (ref 4.40–5.90)
RDW: 18.4 % — ABNORMAL HIGH (ref 11.5–14.5)
WBC: 4 10*3/uL (ref 3.8–10.6)

## 2017-08-11 LAB — LACTATE DEHYDROGENASE: LDH: 144 U/L (ref 98–192)

## 2017-08-11 LAB — URIC ACID
Uric Acid, Serum: 2.8 mg/dL — ABNORMAL LOW (ref 3.7–8.6)
Uric Acid, Serum: 2.5 mg/dL — ABNORMAL LOW (ref 3.7–8.6)

## 2017-08-11 LAB — PHOSPHORUS
Phosphorus: 2.7 mg/dL (ref 2.5–4.6)
Phosphorus: 2.4 mg/dL — ABNORMAL LOW (ref 2.5–4.6)

## 2017-08-11 MED ORDER — SODIUM CHLORIDE 0.9 % IV SOLN
Freq: Once | INTRAVENOUS | Status: AC
  Administered 2017-08-11: 10:00:00 via INTRAVENOUS
  Filled 2017-08-11: qty 1000

## 2017-08-11 NOTE — Progress Notes (Signed)
Patient here for follow up. Will be starting venetoclax today.

## 2017-08-11 NOTE — Telephone Encounter (Signed)
Oral Chemotherapy Pharmacist Encounter   Reviewed Martin Adkins pre-dose labs from today, baseline labs okay to start venetoclax. Met patient in infusion and provided him with venetoclax CLL starter kit. Provided patient re-education on venetoclax dosing, administration, and side effects. Made patient calendars to help him keep track of the dose ramp up, reviewed calendar with him, and gave him a copy to take home.  Unboxed starter kit with the patient and was present when he took his first 35m dose of venetoclax. Patient ate sanck and drank a cup of water with his dose. He knows he is to come back to clinic tomorrow for AM labs.    ADarl Pikes PharmD, BCPS, [facility] Clinical Pharmac[facility] [place] [phone] 08/11/2017 10:54 AM

## 2017-08-11 NOTE — Progress Notes (Signed)
Hematology/Oncology Follow up note [facility] [phone] [phone]   Patient Care Team: [name], MD as PCP - General (Internal Medicine) Earlie Server, MD as Medical Oncologist (Medical Oncology)  REFERRING PROVIDER: [name], MD  REASON FOR VISIT Follow up for management of CLL HISTORY OF PRESENTING ILLNESS:  Martin Adkins is a  72 y.o.  male with PMH listed below who was referred to me for evaluation of lymphocytosis.  Recent lab work on 03/05/2017 showed wbc 11.4, hb 14.6, platelet count 117,000, lymphocytosis 63.5%, neutrophil 22%, Report chronic fatigue, no weight loss, fever or chills.  He reports feeling chest "rattleness". Has for CT chest which showed acute finding, chronic finding includes  postsurgical changes consistent with coronary bypass grafting. One of the bypass grafts arising from the aorta shows apparent thrombosis and an area of distal aneurysmal dilatation which is also Thrombosed. Right adrenal adenoma stable in appearance.Mild scarring in the lung bases.Fatty liver.   Takes Aspirin 20m, coumadine, plavix. He is on anticoagulation for previous history of clots.  Denies any bleeding events. Use to drink hard liquir daily, quitted for 4-5 years. Current drinks wine on weekend.    Image Studies # 04/04/2017  [place]dom[place] showed 1.  Splenomegaly.  No focal splenic lesions evident.2. Increased liver echogenicity, a finding most likely indicative of hepatic steatosis. While no focal liver lesions are evident on this study, it must be cautioned that the sensitivity of ultrasound for detection of focal liver lesions is diminished in this circumstance.3. Pancreas obscured by gas. Portions of inferior vena cava obscured by gas.4.  Gallbladder absent.5. Status post abdominal aortic aneurysm repair. No periaortic fluid.  # 04/21/2017 PET scan 1. Moderate splenomegaly with uniform splenic hypermetabolism,compatible  with the provided history of lymphoproliferative disease.No additional hypermetabolic sites of lymphoproliferative disease. Specifically, no hypermetabolic lymphadenopathy Hepatic steatosis, aortic atherosclerosis. Aneurysm.    # CLL, stage IV disease given spelencemagly, thrombocytopenia, symptomatic with fatigue and weight loss.  CLL IPL score 4, high risk.   INTERVAL HISTORY Martin JCALLAN NORDENis a 72y.o. male who has above presented for assessment prior to starting Veneteclax for treatment of stage IV CLL.  #Patient reports fatigue is gradually better.  Although he still feels not at baseline.  He feels tired after morning long yesterday. #Chronic cough and shortness of breath stable. #Weight loss, weight has been stable.  Review of Systems  Constitutional: Positive for malaise/fatigue. Negative for chills, fever and weight loss.  HENT: Negative for congestion, ear discharge, ear pain, nosebleeds, sinus pain and sore throat.   Eyes: Negative for double vision, photophobia, pain, discharge and redness.  Respiratory: Negative for cough, hemoptysis, sputum production, shortness of breath and wheezing.   Cardiovascular: Negative for chest pain, palpitations, orthopnea, claudication and leg swelling.  Gastrointestinal: Negative for abdominal pain, blood in stool, constipation, diarrhea, heartburn, melena, nausea and vomiting.  Genitourinary: Negative for dysuria, flank pain, frequency, hematuria and urgency.  Musculoskeletal: Negative for back pain, myalgias and neck pain.  Skin: Negative for itching and rash.  Neurological: Negative for dizziness, tingling, tremors, speech change, focal weakness, weakness and headaches.  Endo/Heme/Allergies: Negative for environmental allergies. Does not bruise/bleed easily.  Psychiatric/Behavioral: Negative for depression, hallucinations and suicidal ideas. The patient is not nervous/anxious.     MEDICAL HISTORY:  Past Medical History:  Diagnosis Date    . AAA (abdominal aortic aneurysm) without rupture ([place] 04/05/2014  . AAA (abdominal aortic aneurysm) without rupture ([place] 04/05/2014  . Abdominal  aortic aneurysm without rupture ([place])   . Acute non-ST elevation myocardial infarction (NSTEMI) ([place]) 08/15/2014  . 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/28/2014  . APS (antiphospholipid syndrome) ([place])   . Arteriosclerosis of coronary artery 04/05/2014   Overview:  Sp cabg with lima to lad svg to d1 om1 rca 2004 with occluded svg to rca and d1 and pci stetn of om1 graft 2015   . Arthritis   . B12 deficiency 03/21/2017  . Barrett's esophagus   . Benign essential HTN 05/02/2014  . BPH (benign prostatic hyperplasia)   . CAD (coronary artery disease)   . CHF (congestive heart failure) ([place])   . Chronic systolic heart failure ([place]) 04/19/2014   Overview:  With segmental LV systolic dysfunction ejection fraction of 35%   . CLL (chronic lymphocytic leukemia) ([place])   . CLL (chronic lymphocytic leukemia) ([place]) 05/24/2017  . Diabetes ([place])   . DVT (deep venous thrombosis) ([place])   . Dysrhythmia    ATRIAL FIB.  Marland Kitchen GERD (gastroesophageal reflux disease)   . History of hiatal hernia   . History of shingles 2013  . Hyperlipemia   . Hyperplastic polyps of stomach   . Hypertension   . Myocardial infarction ([place]) 07/30/2014  . NSTEMI (non-ST elevated myocardial infarction) ([place])   . OSA (obstructive sleep apnea)   . Osteoarthritis   . PVD (peripheral vascular disease) ([place])   . RA (rheumatoid arthritis) ([place])   . SOB (shortness of breath)     SURGICAL HISTORY: Past Surgical History:  Procedure Laterality Date  . ABDOMINAL AORTA STENT    . CHOLECYSTECTOMY    . COLONOSCOPY  11/24/2009  . CORONARY ANGIOPLASTY WITH STENT PLACEMENT  2015  . CORONARY ARTERY BYPASS GRAFT    . ESOPHAGOGASTRODUODENOSCOPY  05/26/02  03/23/12  . ESOPHAGOGASTRODUODENOSCOPY (EGD) WITH PROPOFOL N/A 10/28/2016   Procedure: ESOPHAGOGASTRODUODENOSCOPY (EGD) WITH PROPOFOL;  Surgeon:  Manya Silvas, MD;  Location: [facility] ENDOSCOPY;  Service: Endoscopy;  Laterality: N/A;  . PERIPHERAL VASCULAR CATHETERIZATION N/A 09/06/2014   Procedure: IVC Filter Removal;  Surgeon: Katha Cabal, MD;  Location: [facility] CV LAB;  Service: Cardiovascular;  Laterality: N/A;  . TRANSURETHRAL RESECTION OF PROSTATE N/A 02/20/2015   Procedure: TRANSURETHRAL RESECTION OF THE PROSTATE (TURP);  Surgeon: Hollice Espy, MD;  Location: [facility] ORS;  Service: Urology;  Laterality: N/A;    SOCIAL HISTORY: Social History   Socioeconomic History  . Marital status: Legally Separated    Spouse name: Not on file  . Number of children: Not on file  . Years of education: Not on file  . Highest education level: Not on file  Occupational History  . Not on file  Social Needs  . Financial resource strain: Not on file  . Food insecurity:    Worry: Not on file    Inability: Not on file  . Transportation needs:    Medical: Not on file    Non-medical: Not on file  Tobacco Use  . Smoking status: Current Some Day Smoker    Packs/day: 0.25    Years: 20.00    Pack years: 5.00    Types: Cigarettes  . Smokeless tobacco: Never Used  . Tobacco comment: per pt nicotine patch number90m -smokes 1-3 cig/per d now  Substance and Sexual Activity  . Alcohol use: Yes    Alcohol/week: 0.0 oz    Comment: social on weekends  . Drug use: No  . Sexual activity: Never  Lifestyle  . Physical activity:    Days per week:  Not on file    Minutes per session: Not on file  . Stress: Not on file  Relationships  . Social connections:    Talks on phone: Not on file    Gets together: Not on file    Attends religious service: Not on file    Active member of club or organization: Not on file    Attends meetings of clubs or organizations: Not on file    Relationship status: Not on file  . Intimate partner violence:    Fear of current or ex partner: Not on file    Emotionally abused: Not on file    Physically abused:  Not on file    Forced sexual activity: Not on file  Other Topics Concern  . Not on file  Social History Narrative  . Not on file    FAMILY HISTORY: Family History  Problem Relation Age of Onset  . Cancer Mother 52       breast ca  . Diabetes Mother   . Coronary artery disease Father   . Heart attack Father   . Prostate cancer Brother   . Bladder Cancer Neg Hx   . Kidney cancer Neg Hx     ALLERGIES:  is allergic to ace inhibitors; pravastatin; rosuvastatin; simvastatin; amoxicillin; niacin; and penicillins.  MEDICATIONS:  Current Outpatient Medications  Medication Sig Dispense Refill  . albuterol (PROVENTIL HFA;VENTOLIN HFA) 108 (90 Base) MCG/ACT inhaler Inhale 2 puffs into the lungs every 6 (six) hours as needed for wheezing or shortness of breath. 1 Inhaler 0  . allopurinol (ZYLOPRIM) 300 MG tablet Take 1 tablet (300 mg total) by mouth daily. 30 tablet 0  . amLODipine (NORVASC) 10 MG tablet Take 10 mg by mouth daily.     Marland Kitchen aspirin EC 81 MG tablet Take 81 mg by mouth daily.    Marland Kitchen atorvastatin (LIPITOR) 80 MG tablet TAKE 1 TABLET (80 MG TOTAL) BY MOUTH ONCE DAILY.  11  . docusate sodium (COLACE) 100 MG capsule Take 1 capsule (100 mg total) by mouth 2 (two) times daily. 60 capsule 0  . fluticasone (FLONASE) 50 MCG/ACT nasal spray Place 2 sprays into both nostrils daily.     . furosemide (LASIX) 20 MG tablet Take 20 mg by mouth daily.  11  . gabapentin (NEURONTIN) 300 MG capsule Take 1 capsule (300 mg total) by mouth 3 (three) times daily. 90 capsule 0  . hydroxychloroquine (PLAQUENIL) 200 MG tablet Take 1 tablet by mouth daily.    . Ipratropium-Albuterol (COMBIVENT) 20-100 MCG/ACT AERS respimat Inhale 1 puff into the lungs every 6 (six) hours. 1 Inhaler 11  . isosorbide mononitrate (IMDUR) 30 MG 24 hr tablet Take 30 mg by mouth daily.     Marland Kitchen losartan (COZAAR) 100 MG tablet 100 mg daily.     Phillis Knack EYE DROPS 0.4-0.3 % SOLN Apply 1 drop to eye daily as needed.     . metoprolol  succinate (TOPROL-XL) 100 MG 24 hr tablet Take 100 mg by mouth daily.     . montelukast (SINGULAIR) 10 MG tablet Take 1 tablet (10 mg total) by mouth as needed. Take before Gazyva infusion. 20 tablet 0  . pantoprazole (PROTONIX) 40 MG tablet Take 40 mg by mouth daily.     . potassium chloride SA (K-DUR,KLOR-CON) 20 MEQ tablet Take 20 mEq by mouth daily.     . Venetoclax 10 & 50 & 100 MG TBPK Take 23m by mouth daily for week one, 570mdaily for  week 2, 176m daily for week 3, 2013mdaily for week 4. 42 each 0  . vitamin B-12 (CYANOCOBALAMIN) 100 MCG tablet Take 1 tablet (100 mcg total) by mouth daily. 90 tablet 1  . warfarin (COUMADIN) 1 MG tablet Take by mouth daily. Patient takes once a week with 24m38mablet for a total of 6 mg per day    . warfarin (COUMADIN) 5 MG tablet TAKE 1 TABLET (5 MG TOTAL) BY MOUTH ONCE DAILY.  5   No current facility-administered medications for this visit.    Facility-Administered Medications Ordered in Other Visits  Medication Dose Route Frequency Provider Last Rate Last Dose  . 0.9 %  sodium chloride infusion   Intravenous Once Talulah Schirmer,Earlie ServerD   Stopped at 08/11/17 1400     PHYSICAL EXAMINATION: ECOG 1 Vitals:   08/11/17 0845  BP: 117/79  Pulse: 85  Resp: 18  Temp: (!) 97.4 F (36.3 C)  Physical Exam  Constitutional: He is oriented to person, place, and time and well-developed, well-nourished, and in no distress. No distress.  HENT:  Head: Normocephalic and atraumatic.  Nose: Nose normal.  Mouth/Throat: Oropharynx is clear and moist. No oropharyngeal exudate.  Eyes: Pupils are equal, round, and reactive to light. EOM are normal. Left eye exhibits no discharge. No scleral icterus.  Neck: Normal range of motion. Neck supple. No JVD present.  Cardiovascular: Normal rate, regular rhythm and normal heart sounds.  No murmur heard. Pulmonary/Chest: Effort normal and breath sounds normal. No respiratory distress. He has no wheezes. He has no rales. He exhibits  no tenderness.  Abdominal: Soft. He exhibits no distension. There is no tenderness.  Musculoskeletal: Normal range of motion. He exhibits no edema, tenderness or deformity.  Lymphadenopathy:    He has no cervical adenopathy.  Neurological: He is alert and oriented to person, place, and time. No cranial nerve deficit. He exhibits normal muscle tone. Coordination normal.  Skin: Skin is warm and dry. No rash noted. He is not diaphoretic. No erythema.  Psychiatric: Affect and judgment normal.  Nursing note and vitals reviewed.    LABORATORY DATA:  I have reviewed the data as listed Lab Results  Component Value Date   WBC 4.0 08/11/2017   HGB 14.9 08/11/2017   HCT 43.5 08/11/2017   MCV 82.8 08/11/2017   PLT 136 (L) 08/11/2017   Recent Labs    04/09/17 1155  06/23/17 0803 07/21/17 0808 08/11/17 0806  NA  --    < > 138 138 140  K  --    < > 4.0 4.1 3.7  CL  --    < > 109 107 108  CO2  --    < > '22 23 24  ' GLUCOSE  --    < > 150* 150* 117*  BUN  --    < > '15 17 19  ' CREATININE  --    < > 0.81 0.93 0.76  CALCIUM  --    < > 8.9 8.5* 8.9  GFRNONAA  --    < > >60 >60 >60  GFRAA  --    < > >60 >60 >60  PROT  --    < > 7.6 7.7 7.6  ALBUMIN  --    < > 4.1 4.4 4.1  AST  --    < > 30 36 32  ALT  --    < > '19 22 23  ' ALKPHOS  --    < > 95 89 82  BILITOT 1.7*   < > 1.5* 1.6* 1.6*  BILIDIR 0.3  --   --   --   --    < > = values in this interval not displayed.   Hepatitis panel negative.  LDH 228, mildly elevated Beta 2 microglobulin normal.   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 The morphologic and immunophenotypic features are most consistent with chronic lymphocytic leukemia/small lymphocytic lymphoma. Bone marrow Cytogenetics : normal.   Peripheral blood FISH panel negative for CCND1- IGH mutation, ATM, 12, 13q, Tp53 mutation.   ASSESSMENT & PLAN:  72 yo male presents for immunotherapy treatment of Stage IV CLL. 1. CLL  (chronic lymphocytic leukemia) ([place])   2. Encounter for antineoplastic chemotherapy    # CLL: Labs reviewed.  Acceptable to start Veneteclax.  Rationale and Veneteclax side effects were discussed with patient and he is willing to proceed. He has taken allopurinol for 3 days. He also had tumor lysis labs done at baseline. Proceed with Veneteclax 20 mg daily for 7 days.  Will follow dose escalation protocol. He will also have IV fluid 250 mL/h for 4 hours and the repeat tumor lysis labs. In 24 hours, he is going to repeat tumor lysis labs.  #He was see me in one-week for assessment prior to cycle 4 obinutuzumab plus dose escalated Veneteclax with IV fluid infusion.   #Fatigue; stable continue to monitor.   # Postherpetic neuralgia: Stable.. Continue Neurontin 300 mg 3 times a day.    # Lymphopenia, likely due to obinotuzumab treatment.  Continue to monitor.  All questions were answered. The patient knows to call the clinic with any problems questions or concerns.  Return of visit: CBC CMP in 1 week prior to assessment for cycle 4 and next dose escalation of Veneteclax and IV fluid protocol. Total face to face encounter time for this patient visit was 25 min. >50% of the time was  spent in counseling and coordination of care.   Earlie Server, MD, PhD Hematology Oncology [facility] Pager- [phone] 08/11/2017

## 2017-08-12 ENCOUNTER — Inpatient Hospital Stay: Payer: Medicare PPO

## 2017-08-12 DIAGNOSIS — Z5112 Encounter for antineoplastic immunotherapy: Secondary | ICD-10-CM | POA: Diagnosis not present

## 2017-08-12 DIAGNOSIS — C9110 Chronic lymphocytic leukemia of B-cell type not having achieved remission: Secondary | ICD-10-CM

## 2017-08-12 LAB — COMPREHENSIVE METABOLIC PANEL
ALBUMIN: 4.3 g/dL (ref 3.5–5.0)
ALK PHOS: 84 U/L (ref 38–126)
ALT: 23 U/L (ref 0–44)
ANION GAP: 9 (ref 5–15)
AST: 35 U/L (ref 15–41)
BUN: 13 mg/dL (ref 8–23)
CO2: 22 mmol/L (ref 22–32)
Calcium: 8.8 mg/dL — ABNORMAL LOW (ref 8.9–10.3)
Chloride: 104 mmol/L (ref 98–111)
Creatinine, Ser: 0.96 mg/dL (ref 0.61–1.24)
GFR calc Af Amer: >60 mL/min (ref >60)
GFR calc non Af Amer: >60 mL/min (ref >60)
GLUCOSE: 112 mg/dL — AB (ref 70–99)
Potassium: 4.1 mmol/L (ref 3.5–5.1)
SODIUM: 135 mmol/L (ref 135–145)
Total Bilirubin: 1.5 mg/dL — ABNORMAL HIGH (ref 0.3–1.2)
Total Protein: 7.8 g/dL (ref 6.5–8.1)

## 2017-08-12 LAB — PHOSPHORUS: PHOSPHORUS: 2.8 mg/dL (ref 2.5–4.6)

## 2017-08-12 LAB — URIC ACID: URIC ACID, SERUM: 2.3 mg/dL — AB (ref 3.7–8.6)

## 2017-08-12 NOTE — Telephone Encounter (Signed)
Oral Chemotherapy Pharmacist Encounter   Reviewed post dose PM labs from 08/11/17 for Martin Adkins. Stable, no signs of TLS. Will monitor 24hr TLS lab work from today.    Darl Pikes, PharmD, BCPS, [facility] Hematology/Oncology Clinical Pharmacist [facility] [phone]  08/12/2017 9:23 AM

## 2017-08-12 NOTE — Telephone Encounter (Signed)
Oral Chemotherapy Pharmacist Encounter   Reviewed Mr. Tadros 24 hr post dose labs. No signs of TLS. Patient will continue on 20mg  daily of venetoclax then next Monday 08/18/17 he will come into clinic for labs, dose increase, and obinutuzamab.   Spoke with Mr. Gorin, he knows to continue his medication at home and I will see him in clinic on Monday 08/18/17 to increase his dose.   Darl Pikes, PharmD, BCPS, [facility] Hematology/Oncology Clinical Pharmacist [facility] [phone]  08/12/2017 3:43 PM

## 2017-08-18 ENCOUNTER — Other Ambulatory Visit: Payer: Self-pay

## 2017-08-18 ENCOUNTER — Inpatient Hospital Stay (HOSPITAL_BASED_OUTPATIENT_CLINIC_OR_DEPARTMENT_OTHER): Payer: Medicare PPO | Admitting: Oncology

## 2017-08-18 ENCOUNTER — Inpatient Hospital Stay: Payer: Medicare PPO

## 2017-08-18 ENCOUNTER — Encounter: Payer: Self-pay | Admitting: Oncology

## 2017-08-18 ENCOUNTER — Telehealth: Payer: Self-pay | Admitting: Pharmacist

## 2017-08-18 VITALS — BP 126/83 | HR 75 | Temp 96.5°F | Resp 18 | Wt 207.4 lb

## 2017-08-18 DIAGNOSIS — E785 Hyperlipidemia, unspecified: Secondary | ICD-10-CM

## 2017-08-18 DIAGNOSIS — F17210 Nicotine dependence, cigarettes, uncomplicated: Secondary | ICD-10-CM

## 2017-08-18 DIAGNOSIS — R05 Cough: Secondary | ICD-10-CM

## 2017-08-18 DIAGNOSIS — R161 Splenomegaly, not elsewhere classified: Secondary | ICD-10-CM

## 2017-08-18 DIAGNOSIS — I2510 Atherosclerotic heart disease of native coronary artery without angina pectoris: Secondary | ICD-10-CM

## 2017-08-18 DIAGNOSIS — D72810 Lymphocytopenia: Secondary | ICD-10-CM | POA: Diagnosis not present

## 2017-08-18 DIAGNOSIS — K760 Fatty (change of) liver, not elsewhere classified: Secondary | ICD-10-CM

## 2017-08-18 DIAGNOSIS — M069 Rheumatoid arthritis, unspecified: Secondary | ICD-10-CM

## 2017-08-18 DIAGNOSIS — E119 Type 2 diabetes mellitus without complications: Secondary | ICD-10-CM

## 2017-08-18 DIAGNOSIS — B0229 Other postherpetic nervous system involvement: Secondary | ICD-10-CM | POA: Diagnosis not present

## 2017-08-18 DIAGNOSIS — Z8042 Family history of malignant neoplasm of prostate: Secondary | ICD-10-CM

## 2017-08-18 DIAGNOSIS — R0602 Shortness of breath: Secondary | ICD-10-CM

## 2017-08-18 DIAGNOSIS — Z5112 Encounter for antineoplastic immunotherapy: Secondary | ICD-10-CM | POA: Diagnosis not present

## 2017-08-18 DIAGNOSIS — C9110 Chronic lymphocytic leukemia of B-cell type not having achieved remission: Secondary | ICD-10-CM

## 2017-08-18 DIAGNOSIS — Z5111 Encounter for antineoplastic chemotherapy: Secondary | ICD-10-CM

## 2017-08-18 DIAGNOSIS — M129 Arthropathy, unspecified: Secondary | ICD-10-CM

## 2017-08-18 DIAGNOSIS — G4730 Sleep apnea, unspecified: Secondary | ICD-10-CM

## 2017-08-18 DIAGNOSIS — I714 Abdominal aortic aneurysm, without rupture: Secondary | ICD-10-CM

## 2017-08-18 DIAGNOSIS — I739 Peripheral vascular disease, unspecified: Secondary | ICD-10-CM

## 2017-08-18 DIAGNOSIS — E538 Deficiency of other specified B group vitamins: Secondary | ICD-10-CM

## 2017-08-18 DIAGNOSIS — N400 Enlarged prostate without lower urinary tract symptoms: Secondary | ICD-10-CM

## 2017-08-18 DIAGNOSIS — D3501 Benign neoplasm of right adrenal gland: Secondary | ICD-10-CM

## 2017-08-18 DIAGNOSIS — I10 Essential (primary) hypertension: Secondary | ICD-10-CM

## 2017-08-18 DIAGNOSIS — I4891 Unspecified atrial fibrillation: Secondary | ICD-10-CM

## 2017-08-18 DIAGNOSIS — K219 Gastro-esophageal reflux disease without esophagitis: Secondary | ICD-10-CM

## 2017-08-18 DIAGNOSIS — R634 Abnormal weight loss: Secondary | ICD-10-CM

## 2017-08-18 DIAGNOSIS — I700 Atherosclerosis of aorta: Secondary | ICD-10-CM

## 2017-08-18 DIAGNOSIS — Z803 Family history of malignant neoplasm of breast: Secondary | ICD-10-CM

## 2017-08-18 DIAGNOSIS — R5383 Other fatigue: Secondary | ICD-10-CM

## 2017-08-18 DIAGNOSIS — I252 Old myocardial infarction: Secondary | ICD-10-CM

## 2017-08-18 DIAGNOSIS — Z7982 Long term (current) use of aspirin: Secondary | ICD-10-CM

## 2017-08-18 DIAGNOSIS — Z86718 Personal history of other venous thrombosis and embolism: Secondary | ICD-10-CM

## 2017-08-18 DIAGNOSIS — R918 Other nonspecific abnormal finding of lung field: Secondary | ICD-10-CM

## 2017-08-18 DIAGNOSIS — Z86010 Personal history of colonic polyps: Secondary | ICD-10-CM

## 2017-08-18 DIAGNOSIS — D696 Thrombocytopenia, unspecified: Secondary | ICD-10-CM

## 2017-08-18 LAB — COMPREHENSIVE METABOLIC PANEL
ALT: 24 U/L (ref 0–44)
ALT: 23 U/L (ref 0–44)
ANION GAP: 8 (ref 5–15)
AST: 38 U/L (ref 15–41)
AST: 35 U/L (ref 15–41)
Albumin: 4.2 g/dL (ref 3.5–5.0)
Albumin: 4.1 g/dL (ref 3.5–5.0)
Alkaline Phosphatase: 89 U/L (ref 38–126)
Alkaline Phosphatase: 90 U/L (ref 38–126)
Anion gap: 10 (ref 5–15)
BILIRUBIN TOTAL: 1.4 mg/dL — AB (ref 0.3–1.2)
BUN: 18 mg/dL (ref 8–23)
BUN: 20 mg/dL (ref 8–23)
CALCIUM: 9.1 mg/dL (ref 8.9–10.3)
CO2: 22 mmol/L (ref 22–32)
CO2: 22 mmol/L (ref 22–32)
CREATININE: 0.89 mg/dL (ref 0.61–1.24)
Calcium: 8.7 mg/dL — ABNORMAL LOW (ref 8.9–10.3)
Chloride: 108 mmol/L (ref 98–111)
Chloride: 109 mmol/L (ref 98–111)
Creatinine, Ser: 0.9 mg/dL (ref 0.61–1.24)
GFR calc Af Amer: >60 mL/min (ref >60)
Glucose, Bld: 116 mg/dL — ABNORMAL HIGH (ref 70–99)
Glucose, Bld: 147 mg/dL — ABNORMAL HIGH (ref 70–99)
POTASSIUM: 4.7 mmol/L (ref 3.5–5.1)
Potassium: 4 mmol/L (ref 3.5–5.1)
Sodium: 140 mmol/L (ref 135–145)
Sodium: 139 mmol/L (ref 135–145)
TOTAL PROTEIN: 7.5 g/dL (ref 6.5–8.1)
Total Bilirubin: 1.4 mg/dL — ABNORMAL HIGH (ref 0.3–1.2)
Total Protein: 7.5 g/dL (ref 6.5–8.1)

## 2017-08-18 LAB — CBC WITH DIFFERENTIAL/PLATELET
BASOS ABS: 0 10*3/uL (ref 0–0.1)
Basophils Relative: 1 %
Eosinophils Absolute: 0.1 10*3/uL (ref 0–0.7)
Eosinophils Relative: 1 %
HCT: 44.2 % (ref 40.0–52.0)
Hemoglobin: 15.1 g/dL (ref 13.0–18.0)
LYMPHS ABS: 0.9 10*3/uL — AB (ref 1.0–3.6)
LYMPHS PCT: 18 %
MCH: 28.5 pg (ref 26.0–34.0)
MCHC: 34.2 g/dL (ref 32.0–36.0)
MCV: 83.3 fL (ref 80.0–100.0)
MONO ABS: 0.4 10*3/uL (ref 0.2–1.0)
Monocytes Relative: 8 %
Neutro Abs: 3.5 10*3/uL (ref 1.4–6.5)
Neutrophils Relative %: 72 %
Platelets: 148 10*3/uL — ABNORMAL LOW (ref 150–440)
RBC: 5.3 MIL/uL (ref 4.40–5.90)
RDW: 18.5 % — ABNORMAL HIGH (ref 11.5–14.5)
WBC: 4.9 10*3/uL (ref 3.8–10.6)

## 2017-08-18 LAB — PHOSPHORUS
PHOSPHORUS: 3.1 mg/dL (ref 2.5–4.6)
Phosphorus: 1.7 mg/dL — ABNORMAL LOW (ref 2.5–4.6)

## 2017-08-18 LAB — LACTATE DEHYDROGENASE: LDH: 158 U/L (ref 98–192)

## 2017-08-18 LAB — URIC ACID
URIC ACID, SERUM: 3 mg/dL — AB (ref 3.7–8.6)
Uric Acid, Serum: 2.9 mg/dL — ABNORMAL LOW (ref 3.7–8.6)

## 2017-08-18 MED ORDER — DIPHENHYDRAMINE HCL 50 MG/ML IJ SOLN
50.0000 mg | Freq: Once | INTRAMUSCULAR | Status: AC
  Administered 2017-08-18: 50 mg via INTRAVENOUS
  Filled 2017-08-18: qty 1

## 2017-08-18 MED ORDER — SODIUM CHLORIDE 0.9 % IV SOLN
1000.0000 mg | Freq: Once | INTRAVENOUS | Status: AC
  Administered 2017-08-18: 1000 mg via INTRAVENOUS
  Filled 2017-08-18: qty 40

## 2017-08-18 MED ORDER — FAMOTIDINE IN NACL 20-0.9 MG/50ML-% IV SOLN
20.0000 mg | Freq: Two times a day (BID) | INTRAVENOUS | Status: DC
  Administered 2017-08-18: 20 mg via INTRAVENOUS
  Filled 2017-08-18: qty 50

## 2017-08-18 MED ORDER — ALLOPURINOL 300 MG PO TABS: 300.0000 mg | ORAL_TABLET | Freq: Every day | ORAL | 0 refills | Status: DC

## 2017-08-18 MED ORDER — SODIUM CHLORIDE 0.9 % IV SOLN
Freq: Once | INTRAVENOUS | Status: AC
  Administered 2017-08-18: 10:00:00 via INTRAVENOUS
  Filled 2017-08-18: qty 1000

## 2017-08-18 MED ORDER — ACETAMINOPHEN 325 MG PO TABS
650.0000 mg | ORAL_TABLET | Freq: Once | ORAL | Status: AC
  Administered 2017-08-18: 650 mg via ORAL
  Filled 2017-08-18: qty 2

## 2017-08-18 MED ORDER — SODIUM CHLORIDE 0.9 % IV SOLN
20.0000 mg | Freq: Once | INTRAVENOUS | Status: AC
  Administered 2017-08-18: 20 mg via INTRAVENOUS
  Filled 2017-08-18: qty 2

## 2017-08-18 NOTE — Telephone Encounter (Signed)
Oral Chemotherapy Pharmacist Encounter   Reviewed TLS labs, ok to proceed with dose increase today. Went to meet pt in infusion and he brought his medication to clinic with him like instructed. He stated that he felt fine during the first week of his venetoclax. Pt knows he is okay to proceed with his second week and will take his dose in infusion once he gets settled.  I will review his post dose lab.  Darl Pikes, PharmD, BCPS, [facility] Hematology/Oncology Clinical Pharmacist [facility] [phone]  08/18/2017 10:40 AM

## 2017-08-18 NOTE — Progress Notes (Signed)
Hematology/Oncology Follow up note [facility] Telephone:([phone]) [phone] [phone]   Patient Care Team: [name], MD as PCP - General (Internal Medicine) Earlie Server, MD as Medical Oncologist (Medical Oncology)  REFERRING PROVIDER: [name], MD  REASON FOR VISIT Follow up for management of CLL HISTORY OF PRESENTING ILLNESS:  Martin Adkins is a  72 y.o.  male with PMH listed below who was referred to me for evaluation of lymphocytosis.  Recent lab work on 03/05/2017 showed wbc 11.4, hb 14.6, platelet count 117,000, lymphocytosis 63.5%, neutrophil 22%, Report chronic fatigue, no weight loss, fever or chills.  He reports feeling chest "rattleness". Has for CT chest which showed acute finding, chronic finding includes  postsurgical changes consistent with coronary bypass grafting. One of the bypass grafts arising from the aorta shows apparent thrombosis and an area of distal aneurysmal dilatation which is also Thrombosed. Right adrenal adenoma stable in appearance.Mild scarring in the lung bases.Fatty liver.   Takes Aspirin 28m, coumadine, plavix. He is on anticoagulation for previous history of clots.  Denies any bleeding events. Use to drink hard liquir daily, quitted for 4-5 years. Current drinks wine on weekend.    Image Studies # 04/04/2017  [place]dom[place] showed 1.  Splenomegaly.  No focal splenic lesions evident.2. Increased liver echogenicity, a finding most likely indicative of hepatic steatosis. While no focal liver lesions are evident on this study, it must be cautioned that the sensitivity of ultrasound for detection of focal liver lesions is diminished in this circumstance.3. Pancreas obscured by gas. Portions of inferior vena cava obscured by gas.4.  Gallbladder absent.5. Status post abdominal aortic aneurysm repair. No periaortic fluid.  # 04/21/2017 PET scan 1. Moderate splenomegaly with uniform splenic hypermetabolism,compatible  with the provided history of lymphoproliferative disease.No additional hypermetabolic sites of lymphoproliferative disease. Specifically, no hypermetabolic lymphadenopathy Hepatic steatosis, aortic atherosclerosis. Aneurysm.    # CLL, stage IV disease given spelencemagly, thrombocytopenia, symptomatic with fatigue and weight loss.  CLL IPL score 4, high risk.   INTERVAL HISTORY Martin JMARQUIE ADERHOLDis a 72y.o. male who has above presents for assessment prior to ramp-up dose of Venetoclax and cycle 4 Gazyva for treatment of stage IV CLL.  #Patient was started on Venetoclax 20 mg daily last week and tolerated pretty well.  No signs of tumor lysis syndrome. Patient reports feeling well.  Fatigue is gradually better.  Not at baseline yet however a lot better comparing to prior to the treatment.  #He follows up with primary care physician for INR checking for Coumadin dose.  We have previously communicated with his primary care that his INR need to be more frequently checked due to the start of Venetoclax potentially interfering with Coumadin metabolism.  Patient reports that recently his INR was supra therapeutic at 3.3 so he was advised to decrease his Coumadin dose and he will have a recheck of INR this week.  #His weight has been stable.  Chronic cough and shortness of breath is at baseline.  Review of Systems  Constitutional: Positive for malaise/fatigue. Negative for chills, fever and weight loss.  HENT: Negative for congestion, ear discharge, ear pain, nosebleeds, sinus pain and sore throat.   Eyes: Negative for double vision, photophobia, pain, discharge and redness.  Respiratory: Negative for cough, hemoptysis, sputum production, shortness of breath and wheezing.   Cardiovascular: Negative for chest pain, palpitations, orthopnea, claudication and leg swelling.  Gastrointestinal: Negative for abdominal pain, blood in stool, constipation, diarrhea, heartburn, melena, nausea  and vomiting.    Genitourinary: Negative for dysuria, flank pain, frequency, hematuria and urgency.  Musculoskeletal: Negative for back pain, myalgias and neck pain.  Skin: Negative for itching and rash.  Neurological: Negative for dizziness, tingling, tremors, speech change, focal weakness, weakness and headaches.  Endo/Heme/Allergies: Negative for environmental allergies. Does not bruise/bleed easily.  Psychiatric/Behavioral: Negative for depression, hallucinations and suicidal ideas. The patient is not nervous/anxious.     MEDICAL HISTORY:  Past Medical History:  Diagnosis Date  . AAA (abdominal aortic aneurysm) without rupture ([place]) 04/05/2014  . AAA (abdominal aortic aneurysm) without rupture ([place]) 04/05/2014  . Abdominal aortic aneurysm without rupture ([place])   . Acute non-ST elevation myocardial infarction (NSTEMI) ([place]) 08/15/2014  . 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/28/2014  . APS (antiphospholipid syndrome) ([place])   . Arteriosclerosis of coronary artery 04/05/2014   Overview:  Sp cabg with lima to lad svg to d1 om1 rca 2004 with occluded svg to rca and d1 and pci stetn of om1 graft 2015   . Arthritis   . B12 deficiency 03/21/2017  . Barrett's esophagus   . Benign essential HTN 05/02/2014  . BPH (benign prostatic hyperplasia)   . CAD (coronary artery disease)   . CHF (congestive heart failure) ([place])   . Chronic systolic heart failure ([place]) 04/19/2014   Overview:  With segmental LV systolic dysfunction ejection fraction of 35%   . CLL (chronic lymphocytic leukemia) ([place])   . CLL (chronic lymphocytic leukemia) ([place]) 05/24/2017  . Diabetes ([place])   . DVT (deep venous thrombosis) ([place])   . Dysrhythmia    ATRIAL FIB.  Marland Kitchen GERD (gastroesophageal reflux disease)   . History of hiatal hernia   . History of shingles 2013  . Hyperlipemia   . Hyperplastic polyps of stomach   . Hypertension   . Myocardial infarction ([place]) 07/30/2014  . NSTEMI (non-ST elevated myocardial infarction) ([place])   . OSA  (obstructive sleep apnea)   . Osteoarthritis   . PVD (peripheral vascular disease) ([place])   . RA (rheumatoid arthritis) ([place])   . SOB (shortness of breath)     SURGICAL HISTORY: Past Surgical History:  Procedure Laterality Date  . ABDOMINAL AORTA STENT    . CHOLECYSTECTOMY    . COLONOSCOPY  11/24/2009  . CORONARY ANGIOPLASTY WITH STENT PLACEMENT  2015  . CORONARY ARTERY BYPASS GRAFT    . ESOPHAGOGASTRODUODENOSCOPY  05/26/02  03/23/12  . ESOPHAGOGASTRODUODENOSCOPY (EGD) WITH PROPOFOL N/A 10/28/2016   Procedure: ESOPHAGOGASTRODUODENOSCOPY (EGD) WITH PROPOFOL;  Surgeon: Manya Silvas, MD;  Location: [facility] ENDOSCOPY;  Service: Endoscopy;  Laterality: N/A;  . PERIPHERAL VASCULAR CATHETERIZATION N/A 09/06/2014   Procedure: IVC Filter Removal;  Surgeon: Katha Cabal, MD;  Location: [place] CV LAB;  Service: Cardiovascular;  Laterality: N/A;  . TRANSURETHRAL RESECTION OF PROSTATE N/A 02/20/2015   Procedure: TRANSURETHRAL RESECTION OF THE PROSTATE (TURP);  Surgeon: Hollice Espy, MD;  Location: [facility] ORS;  Service: Urology;  Laterality: N/A;    SOCIAL HISTORY: Social History   Socioeconomic History  . Marital status: Legally Separated    Spouse name: Not on file  . Number of children: Not on file  . Years of education: Not on file  . Highest education level: Not on file  Occupational History  . Not on file  Social Needs  . Financial resource strain: Not on file  . Food insecurity:    Worry: Not on file    Inability: Not on file  . Transportation needs:    Medical: Not  on file    Non-medical: Not on file  Tobacco Use  . Smoking status: Current Some Day Smoker    Packs/day: 0.25    Years: 20.00    Pack years: 5.00    Types: Cigarettes  . Smokeless tobacco: Never Used  . Tobacco comment: per pt nicotine patch number13m -smokes 1-3 cig/per d now  Substance and Sexual Activity  . Alcohol use: Yes    Alcohol/week: 0.0 standard drinks    Comment: social on weekends  .  Drug use: No  . Sexual activity: Never  Lifestyle  . Physical activity:    Days per week: Not on file    Minutes per session: Not on file  . Stress: Not on file  Relationships  . Social connections:    Talks on phone: Not on file    Gets together: Not on file    Attends religious service: Not on file    Active member of club or organization: Not on file    Attends meetings of clubs or organizations: Not on file    Relationship status: Not on file  . Intimate partner violence:    Fear of current or ex partner: Not on file    Emotionally abused: Not on file    Physically abused: Not on file    Forced sexual activity: Not on file  Other Topics Concern  . Not on file  Social History Narrative  . Not on file    FAMILY HISTORY: Family History  Problem Relation Age of Onset  . Cancer Mother [phone]      breast ca  . Diabetes Mother   . Coronary artery disease Father   . Heart attack Father   . Prostate cancer Brother   . Bladder Cancer Neg Hx   . Kidney cancer Neg Hx     ALLERGIES:  is allergic to ace inhibitors; pravastatin; rosuvastatin; simvastatin; amoxicillin; niacin; and penicillins.  MEDICATIONS:  Current Outpatient Medications  Medication Sig Dispense Refill  . albuterol (PROVENTIL HFA;VENTOLIN HFA) 108 (90 Base) MCG/ACT inhaler Inhale 2 puffs into the lungs every 6 (six) hours as needed for wheezing or shortness of breath. 1 Inhaler 0  . allopurinol (ZYLOPRIM) 300 MG tablet Take 1 tablet (300 mg total) by mouth daily. 30 tablet 0  . amLODipine (NORVASC) 10 MG tablet Take 10 mg by mouth daily.     .Marland Kitchenaspirin EC 81 MG tablet Take 81 mg by mouth daily.    .Marland Kitchenatorvastatin (LIPITOR) 80 MG tablet TAKE 1 TABLET (80 MG TOTAL) BY MOUTH ONCE DAILY.  11  . docusate sodium (COLACE) 100 MG capsule Take 1 capsule (100 mg total) by mouth 2 (two) times daily. 60 capsule 0  . fluticasone (FLONASE) 50 MCG/ACT nasal spray Place 2 sprays into both nostrils daily.     . furosemide (LASIX)  20 MG tablet Take 20 mg by mouth daily.  11  . gabapentin (NEURONTIN) 300 MG capsule Take 1 capsule (300 mg total) by mouth 3 (three) times daily. 90 capsule 0  . hydroxychloroquine (PLAQUENIL) 200 MG tablet Take 1 tablet by mouth daily.    . Ipratropium-Albuterol (COMBIVENT) 20-100 MCG/ACT AERS respimat Inhale 1 puff into the lungs every 6 (six) hours. 1 Inhaler 11  . isosorbide mononitrate (IMDUR) 30 MG 24 hr tablet Take 30 mg by mouth daily.     .Marland Kitchenlosartan (COZAAR) 100 MG tablet 100 mg daily.     . LUBRICANT EYE DROPS 0.4-0.3 % SOLN Apply 1  drop to eye daily as needed.     . metoprolol succinate (TOPROL-XL) 100 MG 24 hr tablet Take 100 mg by mouth daily.     . montelukast (SINGULAIR) 10 MG tablet Take 1 tablet (10 mg total) by mouth as needed. Take before Gazyva infusion. 20 tablet 0  . pantoprazole (PROTONIX) 40 MG tablet Take 40 mg by mouth daily.     . potassium chloride SA (K-DUR,KLOR-CON) 20 MEQ tablet Take 20 mEq by mouth daily.     . Venetoclax 10 & 50 & 100 MG TBPK Take 37m by mouth daily for week one, 570mdaily for week 2, 10032maily for week 3, 200m11mily for week 4. 42 each 0  . vitamin B-12 (CYANOCOBALAMIN) 100 MCG tablet Take 1 tablet (100 mcg total) by mouth daily. 90 tablet 1  . warfarin (COUMADIN) 1 MG tablet Take by mouth daily. Patient takes once a week with 5mg 61mlet for a total of 6 mg per day    . warfarin (COUMADIN) 5 MG tablet TAKE 1 TABLET (5 MG TOTAL) BY MOUTH ONCE DAILY.  5   No current facility-administered medications for this visit.      PHYSICAL EXAMINATION: ECOG 1 Vitals:   08/18/17 0853  BP: 126/83  Pulse: 75  Resp: 18  Temp: (!) 96.5 F (35.8 C)  SpO2: 97%  Physical Exam  Constitutional: He is oriented to person, place, and time and well-developed, well-nourished, and in no distress. No distress.  HENT:  Head: Normocephalic and atraumatic.  Nose: Nose normal.  Mouth/Throat: Oropharynx is clear and moist. No oropharyngeal exudate.  Eyes:  Pupils are equal, round, and reactive to light. EOM are normal. Left eye exhibits no discharge. No scleral icterus.  Neck: Normal range of motion. Neck supple. No JVD present.  Cardiovascular: Normal rate, regular rhythm and normal heart sounds.  No murmur heard. Pulmonary/Chest: Effort normal and breath sounds normal. No respiratory distress. He has no wheezes. He has no rales. He exhibits no tenderness.  Abdominal: Soft. He exhibits no distension and no mass. There is no tenderness.  Musculoskeletal: Normal range of motion. He exhibits no edema, tenderness or deformity.  Lymphadenopathy:    He has no cervical adenopathy.  Neurological: He is alert and oriented to person, place, and time. No cranial nerve deficit. He exhibits normal muscle tone. Coordination normal.  Skin: Skin is warm and dry. No rash noted. He is not diaphoretic. No erythema.  Psychiatric: Affect and judgment normal.  Nursing note and vitals reviewed.    LABORATORY DATA:  I have reviewed the data as listed Lab Results  Component Value Date   WBC 4.9 08/18/2017   HGB 15.1 08/18/2017   HCT 44.2 08/18/2017   MCV 83.3 08/18/2017   PLT 148 (L) 08/18/2017   Recent Labs    04/09/17 1155  08/11/17 1402 08/12/17 1104 08/18/17 0815  NA  --    < > 137 135 140  K  --    < > 4.1 4.1 4.0  CL  --    < > 108 104 108  CO2  --    < > '23 22 22  ' GLUCOSE  --    < > 106* 112* 116*  BUN  --    < > '18 13 18  ' CREATININE  --    < > 0.83 0.96 0.89  CALCIUM  --    < > 8.3* 8.8* 9.1  GFRNONAA  --    < > >60 >60 >  60  GFRAA  --    < > >60 >60 >60  PROT  --    < > 7.0 7.8 7.5  ALBUMIN  --    < > 3.9 4.3 4.2  AST  --    < > 29 35 38  ALT  --    < > '20 23 24  ' ALKPHOS  --    < > 82 84 89  BILITOT 1.7*   < > 1.5* 1.5* 1.4*  BILIDIR 0.3  --   --   --   --    < > = values in this interval not displayed.   Hepatitis panel negative.  LDH 228, mildly elevated Beta 2 microglobulin normal.   Bone marrow biopsy  Showed hypercellular  marrow involved with non Hodgkin's B cell lymphoma. The morphologic and immunophenotypic features are most consistent with chronic lymphocytic leukemia/small lymphocytic lymphoma. Bone marrow Cytogenetics : normal.   Peripheral blood FISH panel negative for CCND1- IGH mutation, ATM, 12, 13q, Tp53 mutation.   ASSESSMENT & PLAN:  72 yo male presents for immunotherapy treatment of Stage IV CLL. 1. Encounter for antineoplastic chemotherapy   2. CLL (chronic lymphocytic leukemia) ([place])   3. Encounter for antineoplastic immunotherapy   4. Lymphopenia   5. Thrombocytopenia ([place])    # CLL: Labs reviewed and discussed with patient.  Acceptable to ramp-up Venetoclax to 50 mg daily for this week Proceed with cycle 4 Gazyva treatment today. He will receive IV fluid 250 mill per hour for 4 hours today and repeat tumor lysis labs again.  In 24 hours he is going to repeat tumor lysis lab again. Assuming he continues to do well he is Venetoclax dose will be ramped up to 100 mg next week and 200 mg the following week and 400 mg subsequently.  He will needs to have CBC, c-Met, phosphorus uric acid LDH done prior to the dose ramp-up. Advised patient to continue allopurinol 300 mg daily for another 4 weeks.  If stable will discontinue allopurinol at that time. I will meet him and evaluate him again on September 9 for cycle 5 Gazyva.  He will be on venetoclax 400 mg dose at that time.  #Fatigue; stable.  Continue monitor. # Postherpetic neuralgia: Stable continue Neurontin 300 mg 3 times a day.. #.  Stable.  Continue to monitor. # Lymphopenia, stable.  Likely secondary to conservative treatment.  Continue to monitor.   All questions were answered. The patient knows to call the clinic with any problems questions or concerns.  Return of visit: Patient will need labs in 24 hours, and weekly. Lab MD visit for assessment prior to cycle 5 with every treatment in 4 weeks. Total face to face encounter time for this  patient visit was 79mn. >50% of the time was  spent in counseling and coordination of care.     ZEarlie Server MD, PhD Hematology Oncology CCapital [facility]oscopy LLCat ASt. Anthony'S Regional HospitalPager- [phone]/12/2017

## 2017-08-18 NOTE — Progress Notes (Signed)
Patient here for follow up. No concerns voiced.  °

## 2017-08-18 NOTE — Telephone Encounter (Signed)
Oral Chemotherapy Pharmacist Encounter   Reviewed post dose PM labs from 08/18/17 for Martin Adkins. Stable, no signs of TLS. Will monitor 24hr TLS lab work from today.

## 2017-08-19 ENCOUNTER — Inpatient Hospital Stay: Payer: Medicare PPO

## 2017-08-19 ENCOUNTER — Other Ambulatory Visit: Payer: Self-pay

## 2017-08-19 DIAGNOSIS — Z5112 Encounter for antineoplastic immunotherapy: Secondary | ICD-10-CM | POA: Diagnosis not present

## 2017-08-19 DIAGNOSIS — C9110 Chronic lymphocytic leukemia of B-cell type not having achieved remission: Secondary | ICD-10-CM

## 2017-08-19 LAB — COMPREHENSIVE METABOLIC PANEL
ALBUMIN: 4 g/dL (ref 3.5–5.0)
ALT: 20 U/L (ref 0–44)
ANION GAP: 10 (ref 5–15)
AST: 34 U/L (ref 15–41)
Alkaline Phosphatase: 84 U/L (ref 38–126)
BUN: 17 mg/dL (ref 8–23)
CHLORIDE: 104 mmol/L (ref 98–111)
CO2: 21 mmol/L — ABNORMAL LOW (ref 22–32)
Calcium: 9.1 mg/dL (ref 8.9–10.3)
Creatinine, Ser: 0.88 mg/dL (ref 0.61–1.24)
GFR calc Af Amer: >60 mL/min (ref >60)
GFR calc non Af Amer: >60 mL/min (ref >60)
Glucose, Bld: 124 mg/dL — ABNORMAL HIGH (ref 70–99)
POTASSIUM: 4 mmol/L (ref 3.5–5.1)
Sodium: 135 mmol/L (ref 135–145)
TOTAL PROTEIN: 7.2 g/dL (ref 6.5–8.1)
Total Bilirubin: 1.3 mg/dL — ABNORMAL HIGH (ref 0.3–1.2)

## 2017-08-19 LAB — PHOSPHORUS: PHOSPHORUS: 2.7 mg/dL (ref 2.5–4.6)

## 2017-08-19 LAB — URIC ACID: URIC ACID, SERUM: 2.8 mg/dL — AB (ref 3.7–8.6)

## 2017-08-19 NOTE — Telephone Encounter (Signed)
Oral Chemotherapy Pharmacist Encounter  Reviewed 24 hr abs from today 08/19/17 for Mr. Borton. Stable, no signs of TLS.   Darl Pikes, PharmD, BCPS, [facility] Hematology/Oncology Clinical Pharmacist [facility] [phone]  08/19/2017 4:20 PM

## 2017-08-22 ENCOUNTER — Ambulatory Visit (INDEPENDENT_AMBULATORY_CARE_PROVIDER_SITE_OTHER): Payer: Medicare PPO | Admitting: Nurse Practitioner

## 2017-08-22 ENCOUNTER — Encounter (INDEPENDENT_AMBULATORY_CARE_PROVIDER_SITE_OTHER): Payer: Self-pay

## 2017-08-22 ENCOUNTER — Ambulatory Visit (INDEPENDENT_AMBULATORY_CARE_PROVIDER_SITE_OTHER): Payer: Medicare PPO

## 2017-08-22 VITALS — BP 132/89 | HR 88 | Resp 16 | Ht 72.0 in | Wt 207.0 lb

## 2017-08-22 DIAGNOSIS — I714 Abdominal aortic aneurysm, without rupture, unspecified: Secondary | ICD-10-CM

## 2017-08-22 DIAGNOSIS — I739 Peripheral vascular disease, unspecified: Secondary | ICD-10-CM | POA: Diagnosis not present

## 2017-08-22 DIAGNOSIS — I10 Essential (primary) hypertension: Secondary | ICD-10-CM | POA: Diagnosis not present

## 2017-08-22 DIAGNOSIS — I713 Abdominal aortic aneurysm, ruptured, unspecified: Secondary | ICD-10-CM | POA: Insufficient documentation

## 2017-08-24 ENCOUNTER — Encounter (INDEPENDENT_AMBULATORY_CARE_PROVIDER_SITE_OTHER): Payer: Self-pay | Admitting: Nurse Practitioner

## 2017-08-24 NOTE — Progress Notes (Signed)
Subjective:    Patient ID: Martin Adkins, male    DOB: [birth year], 72 y.o.   MRN: [id number] Chief Complaint  Patient presents with  . Follow-up    86month EVAR,ABI    HPI   Mr. Heroux is a 72 year old male following up on his AAA.  He underwent endovascular repair approximately 5 to 6 years ago.  He has not had any recent symptoms such as abdominal pain.  No fever, chills, nausea, vomiting.  Mr. Cocuzza underwent an aortic duplex today which shows the diameter of the AAA measuring 4.95 x 5.02, which is increased from previous diameter measurements of 4.7 x 4.5 done on 02/18/2017.  ABIs were also checked on this visit.  There were noncompressible waveforms detected.  The right leg has triphasic/biphasic waveforms.  The left leg has hyperemic waveforms..  Constitutional: [] Weight loss  [] Fever  [] Chills Cardiac: [] Chest pain   [] Chest pressure   [] Palpitations   [] Shortness of breath when laying flat   [] Shortness of breath with exertion. Vascular:  [x] Pain in legs with walking   [] Pain in legs with standing  [] History of DVT   [] Phlebitis   [] Swelling in legs   [] Varicose veins   [] Non-healing ulcers Pulmonary:   [] Uses home oxygen   [] Productive cough   [] Hemoptysis   [] Wheeze  [] COPD   [] Asthma Neurologic:  [] Dizziness   [] Seizures   [] History of stroke   [] History of TIA  [] Aphasia   [] Vissual changes   [] Weakness or numbness in arm   [] Weakness or numbness in leg Musculoskeletal:   [] Joint swelling   [] Joint pain   [] Low back pain Hematologic:  [] Easy bruising  [] Easy bleeding   [x] Hypercoagulable state   [] Anemic Gastrointestinal:  [] Diarrhea   [] Vomiting  [] Gastroesophageal reflux/heartburn   [] Difficulty swallowing. Genitourinary:  [] Chronic kidney disease   [] Difficult urination  [] Frequent urination   [] Blood in urine Skin:  [] Rashes   [] Ulcers  Psychological:  [] History of anxiety   []  History of major depression.     Objective:   Physical Exam  BP 132/89 (BP Location:  Right Arm)   Pulse 88   Resp 16   Ht 6' (1.829 m)   Wt 207 lb (93.9 kg)   BMI 28.07 kg/m   Past Medical History:  Diagnosis Date  . AAA (abdominal aortic aneurysm) without rupture ([place]) 04/05/2014  . AAA (abdominal aortic aneurysm) without rupture ([place]) 04/05/2014  . Abdominal aortic aneurysm without rupture ([place])   . Acute non-ST elevation myocardial infarction (NSTEMI) ([place]) 08/15/2014  . 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/28/2014  . APS (antiphospholipid syndrome) ([place])   . Arteriosclerosis of coronary artery 04/05/2014   Overview:  Sp cabg with lima to lad svg to d1 om1 rca 2004 with occluded svg to rca and d1 and pci stetn of om1 graft 2015   . Arthritis   . B12 deficiency 03/21/2017  . Barrett's esophagus   . Benign essential HTN 05/02/2014  . BPH (benign prostatic hyperplasia)   . CAD (coronary artery disease)   . CHF (congestive heart failure) ([place])   . Chronic systolic heart failure ([place]) 04/19/2014   Overview:  With segmental LV systolic dysfunction ejection fraction of 35%   . CLL (chronic lymphocytic leukemia) ([place])   . CLL (chronic lymphocytic leukemia) ([place]) 05/24/2017  . Diabetes ([place])   . DVT (deep venous thrombosis) ([place])   . Dysrhythmia    ATRIAL FIB.  Marland Kitchen GERD (gastroesophageal reflux disease)   . History of hiatal hernia   .  History of shingles 2013  . Hyperlipemia   . Hyperplastic polyps of stomach   . Hypertension   . Myocardial infarction ([place]) 07/30/2014  . NSTEMI (non-ST elevated myocardial infarction) ([place])   . OSA (obstructive sleep apnea)   . Osteoarthritis   . PVD (peripheral vascular disease) ([place])   . RA (rheumatoid arthritis) ([place])   . SOB (shortness of breath)      Gen: WD/WN, NAD Head: [place]/AT, No temporalis wasting.  Ear/Nose/Throat: Hearing grossly intact, nares w/o erythema or drainage, poor dentition Eyes: PER, EOMI, sclera erythematous.  Neck: Supple, no masses.  No bruit or JVD.  Pulmonary:  Good air movement, clear to auscultation  bilaterally, no use of accessory muscles.  Cardiac: RRR, normal S1, S2, no Murmurs. Vascular:  Vessel Right Left  PT  palpable  not palpable  DP  palpable  palpable  Gastrointestinal: soft, non-distended. No guarding/no peritoneal signs.  Musculoskeletal: M/S 5/5 throughout.  No deformity or atrophy.  Neurologic: CN 2-12 intact. Pain and light touch intact in extremities.  Symmetrical.  Speech is fluent. Motor exam as listed above. Psychiatric: Judgment intact, Mood & affect appropriate for pt's clinical situation. Dermatologic: no Venous rashes no ulcers noted.  No changes consistent with cellulitis. Lymph : No Cervical lymphadenopathy, no lichenification or skin changes of chronic lymphedema   Social History   Socioeconomic History  . Marital status: Legally Separated    Spouse name: Not on file  . Number of children: Not on file  . Years of education: Not on file  . Highest education level: Not on file  Occupational History  . Not on file  Social Needs  . Financial resource strain: Not on file  . Food insecurity:    Worry: Not on file    Inability: Not on file  . Transportation needs:    Medical: Not on file    Non-medical: Not on file  Tobacco Use  . Smoking status: Current Some Day Smoker    Packs/day: 0.25    Years: 20.00    Pack years: 5.00    Types: Cigarettes  . Smokeless tobacco: Never Used  . Tobacco comment: per pt nicotine patch number7mg  -smokes 1-3 cig/per d now  Substance and Sexual Activity  . Alcohol use: Yes    Alcohol/week: 0.0 standard drinks    Comment: social on weekends  . Drug use: No  . Sexual activity: Never  Lifestyle  . Physical activity:    Days per week: Not on file    Minutes per session: Not on file  . Stress: Not on file  Relationships  . Social connections:    Talks on phone: Not on file    Gets together: Not on file    Attends religious service: Not on file    Active member of club or organization: Not on file    Attends  meetings of clubs or organizations: Not on file    Relationship status: Not on file  . Intimate partner violence:    Fear of current or ex partner: Not on file    Emotionally abused: Not on file    Physically abused: Not on file    Forced sexual activity: Not on file  Other Topics Concern  . Not on file  Social History Narrative  . Not on file    Past Surgical History:  Procedure Laterality Date  . ABDOMINAL AORTA STENT    . CHOLECYSTECTOMY    . COLONOSCOPY  11/24/2009  . CORONARY ANGIOPLASTY WITH  STENT PLACEMENT  2015  . CORONARY ARTERY BYPASS GRAFT    . ESOPHAGOGASTRODUODENOSCOPY  05/26/02  03/23/12  . ESOPHAGOGASTRODUODENOSCOPY (EGD) WITH PROPOFOL N/A 10/28/2016   Procedure: ESOPHAGOGASTRODUODENOSCOPY (EGD) WITH PROPOFOL;  Surgeon: Manya Silvas, MD;  Location: [facility] ENDOSCOPY;  Service: Endoscopy;  Laterality: N/A;  . PERIPHERAL VASCULAR CATHETERIZATION N/A 09/06/2014   Procedure: IVC Filter Removal;  Surgeon: Katha Cabal, MD;  Location: [facility] CV LAB;  Service: Cardiovascular;  Laterality: N/A;  . TRANSURETHRAL RESECTION OF PROSTATE N/A 02/20/2015   Procedure: TRANSURETHRAL RESECTION OF THE PROSTATE (TURP);  Surgeon: Hollice Espy, MD;  Location: [facility] ORS;  Service: Urology;  Laterality: N/A;    Family History  Problem Relation Age of Onset  . Cancer Mother 66       breast ca  . Diabetes Mother   . Coronary artery disease Father   . Heart attack Father   . Prostate cancer Brother   . Bladder Cancer Neg Hx   . Kidney cancer Neg Hx     Allergies  Allergen Reactions  . Ace Inhibitors Other (See Comments)    Other reaction(s): Cough  . Pravastatin Other (See Comments)    Other reaction(s): Muscle Pain  . Rosuvastatin Other (See Comments)    Other reaction(s): Other (See Comments) GI bleed  . Simvastatin Other (See Comments)    Other reaction(s): Muscle Pain  . Amoxicillin Rash  . Niacin Rash  . Penicillins Rash    Has patient had a PCN reaction  causing immediate rash, facial/tongue/throat swelling, SOB or lightheadedness with hypotension: Yes Has patient had a PCN reaction causing severe rash involving mucus membranes or skin necrosis: Unknown Has patient had a PCN reaction that required hospitalization: Unknown Has patient had a PCN reaction occurring within the last 10 years: No If all of the above answers are "NO", then may proceed with Cephalosporin use.       Assessment & Plan:   1. Leaking abdominal aortic aneurysm (AAA) ([place]) The duplex today revealed growth of the aneurysmal sac despite repair.  We will order a CT to evaluate for evar leak, as well as possible repair options.  He will follow-up with [place] following completion of the CT scan.  - CT Abdomen Pelvis W Contrast; Future  2. Peripheral vascular disease ([place]) The patient's symptoms and ABIs suggest that there may be a component of peripheral vascular disease, however given evar leak we have to place priority here.  We will follow-up with a lower extremity duplex once we have determined the best course of action for the evar leak  3. Benign essential HTN Continue antihypertensive medications as already ordered, these medications have been reviewed and there are no changes at this time.     Current Outpatient Medications on File Prior to Visit  Medication Sig Dispense Refill  . albuterol (PROVENTIL HFA;VENTOLIN HFA) 108 (90 Base) MCG/ACT inhaler Inhale 2 puffs into the lungs every 6 (six) hours as needed for wheezing or shortness of breath. 1 Inhaler 0  . allopurinol (ZYLOPRIM) 300 MG tablet Take 1 tablet (300 mg total) by mouth daily. 30 tablet 0  . amLODipine (NORVASC) 10 MG tablet Take 10 mg by mouth daily.     Marland Kitchen aspirin EC 81 MG tablet Take 81 mg by mouth daily.    Marland Kitchen atorvastatin (LIPITOR) 80 MG tablet TAKE 1 TABLET (80 MG TOTAL) BY MOUTH ONCE DAILY.  11  . docusate sodium (COLACE) 100 MG capsule Take 1 capsule (100 mg total) by  mouth 2 (two) times daily. 60  capsule 0  . fluticasone (FLONASE) 50 MCG/ACT nasal spray Place 2 sprays into both nostrils daily.     . furosemide (LASIX) 20 MG tablet Take 20 mg by mouth daily.  11  . gabapentin (NEURONTIN) 300 MG capsule Take 1 capsule (300 mg total) by mouth 3 (three) times daily. 90 capsule 0  . hydroxychloroquine (PLAQUENIL) 200 MG tablet Take 1 tablet by mouth daily.    . Ipratropium-Albuterol (COMBIVENT) 20-100 MCG/ACT AERS respimat Inhale 1 puff into the lungs every 6 (six) hours. 1 Inhaler 11  . isosorbide mononitrate (IMDUR) 30 MG 24 hr tablet Take 30 mg by mouth daily.     Marland Kitchen losartan (COZAAR) 100 MG tablet 100 mg daily.     Phillis Knack EYE DROPS 0.4-0.3 % SOLN Apply 1 drop to eye daily as needed.     . metoprolol succinate (TOPROL-XL) 100 MG 24 hr tablet Take 100 mg by mouth daily.     . montelukast (SINGULAIR) 10 MG tablet Take 1 tablet (10 mg total) by mouth as needed. Take before Gazyva infusion. 20 tablet 0  . pantoprazole (PROTONIX) 40 MG tablet Take 40 mg by mouth daily.     . potassium chloride SA (K-DUR,KLOR-CON) 20 MEQ tablet Take 20 mEq by mouth daily.     . Venetoclax 10 & 50 & 100 MG TBPK Take 20mg  by mouth daily for week one, 50mg  daily for week 2, 100mg  daily for week 3, 200mg  daily for week 4. 42 each 0  . vitamin B-12 (CYANOCOBALAMIN) 100 MCG tablet Take 1 tablet (100 mcg total) by mouth daily. 90 tablet 1  . warfarin (COUMADIN) 1 MG tablet Take by mouth daily. Patient takes once a week with 5mg  tablet for a total of 6 mg per day    . warfarin (COUMADIN) 5 MG tablet TAKE 1 TABLET (5 MG TOTAL) BY MOUTH ONCE DAILY.  5   No current facility-administered medications on file prior to visit.     There are no Patient Instructions on file for this visit. No follow-ups on file.   Kris Hartmann, NP

## 2017-08-25 ENCOUNTER — Other Ambulatory Visit (INDEPENDENT_AMBULATORY_CARE_PROVIDER_SITE_OTHER): Payer: Self-pay | Admitting: Nurse Practitioner

## 2017-08-25 DIAGNOSIS — I713 Abdominal aortic aneurysm, ruptured, unspecified: Secondary | ICD-10-CM

## 2017-08-25 DIAGNOSIS — I714 Abdominal aortic aneurysm, without rupture: Secondary | ICD-10-CM

## 2017-08-26 ENCOUNTER — Inpatient Hospital Stay: Payer: Medicare PPO

## 2017-08-26 DIAGNOSIS — Z5112 Encounter for antineoplastic immunotherapy: Secondary | ICD-10-CM | POA: Diagnosis not present

## 2017-08-26 DIAGNOSIS — C9110 Chronic lymphocytic leukemia of B-cell type not having achieved remission: Secondary | ICD-10-CM

## 2017-08-26 LAB — CBC WITH DIFFERENTIAL/PLATELET
BASOS ABS: 0 10*3/uL (ref 0–0.1)
Basophils Relative: 1 %
EOS ABS: 0 10*3/uL (ref 0–0.7)
EOS PCT: 0 %
HCT: 44.6 % (ref 40.0–52.0)
HEMOGLOBIN: 15.2 g/dL (ref 13.0–18.0)
LYMPHS ABS: 0.9 10*3/uL — AB (ref 1.0–3.6)
LYMPHS PCT: 18 %
MCH: 28.8 pg (ref 26.0–34.0)
MCHC: 34.1 g/dL (ref 32.0–36.0)
MCV: 84.6 fL (ref 80.0–100.0)
Monocytes Absolute: 0.5 10*3/uL (ref 0.2–1.0)
Monocytes Relative: 10 %
NEUTROS PCT: 71 %
Neutro Abs: 3.7 10*3/uL (ref 1.4–6.5)
PLATELETS: 161 10*3/uL (ref 150–440)
RBC: 5.28 MIL/uL (ref 4.40–5.90)
RDW: 17.8 % — ABNORMAL HIGH (ref 11.5–14.5)
WBC: 5.2 10*3/uL (ref 3.8–10.6)

## 2017-08-26 LAB — COMPREHENSIVE METABOLIC PANEL
ALT: 21 U/L (ref 0–44)
AST: 28 U/L (ref 15–41)
Albumin: 4.3 g/dL (ref 3.5–5.0)
Alkaline Phosphatase: 93 U/L (ref 38–126)
Anion gap: 10 (ref 5–15)
BILIRUBIN TOTAL: 2 mg/dL — AB (ref 0.3–1.2)
BUN: 16 mg/dL (ref 8–23)
CHLORIDE: 106 mmol/L (ref 98–111)
CO2: 21 mmol/L — ABNORMAL LOW (ref 22–32)
CREATININE: 0.74 mg/dL (ref 0.61–1.24)
Calcium: 8.6 mg/dL — ABNORMAL LOW (ref 8.9–10.3)
GFR calc non Af Amer: >60 mL/min (ref >60)
Glucose, Bld: 107 mg/dL — ABNORMAL HIGH (ref 70–99)
POTASSIUM: 3.9 mmol/L (ref 3.5–5.1)
Sodium: 137 mmol/L (ref 135–145)
TOTAL PROTEIN: 7.3 g/dL (ref 6.5–8.1)

## 2017-08-27 ENCOUNTER — Other Ambulatory Visit: Payer: Self-pay | Admitting: Oncology

## 2017-08-27 DIAGNOSIS — C9110 Chronic lymphocytic leukemia of B-cell type not having achieved remission: Secondary | ICD-10-CM

## 2017-08-27 NOTE — Progress Notes (Signed)
U[place]

## 2017-08-28 ENCOUNTER — Other Ambulatory Visit: Payer: Self-pay | Admitting: Pharmacist

## 2017-08-28 ENCOUNTER — Encounter: Payer: Self-pay | Admitting: Oncology

## 2017-08-28 ENCOUNTER — Other Ambulatory Visit: Payer: Self-pay | Admitting: Oncology

## 2017-08-28 ENCOUNTER — Telehealth: Payer: Self-pay | Admitting: *Deleted

## 2017-08-28 DIAGNOSIS — C9110 Chronic lymphocytic leukemia of B-cell type not having achieved remission: Secondary | ICD-10-CM

## 2017-08-28 DIAGNOSIS — Z7901 Long term (current) use of anticoagulants: Secondary | ICD-10-CM

## 2017-08-28 MED ORDER — VENETOCLAX 100 MG PO TABS: 400.0000 mg | ORAL_TABLET | Freq: Every day | ORAL | 2 refills | Status: DC

## 2017-08-28 NOTE — Telephone Encounter (Signed)
Ok. Ordered.

## 2017-08-28 NOTE — Telephone Encounter (Signed)
Dr Nehemiah Massed requests that we order physical therapy INR with scheduled labs on Tuesday as the medicine we have patient on is elevating his INR.

## 2017-08-29 ENCOUNTER — Telehealth: Payer: Self-pay | Admitting: Pharmacist

## 2017-08-29 NOTE — Telephone Encounter (Signed)
Oral Chemotherapy Pharmacist Encounter   Called Mr. Tedesco to check in on 08/28/17. He stqated he was doing well since dose increasing his venetoclax on Monday. His labs from 08/26/17 looked stable, spoke with Dr. Collie Siad team and requested that we check a uric acid and phos at lab appt next week.   Sent prescription for 400mg /d of venetoclax, this will be the prescription filled after Mr. Weathington finishes his starter pack.    Darl Pikes, PharmD, BCPS, [facility] Hematology/Oncology Clinical Pharmacist [facility] [phone]  08/29/2017 8:34 AM

## 2017-09-01 ENCOUNTER — Ambulatory Visit
Admission: RE | Admit: 2017-09-01 | Discharge: 2017-09-01 | Disposition: A | Discharge status: Home / Self Care | Payer: Medicare PPO | Source: Ambulatory Visit | Attending: Nurse Practitioner | Admitting: Nurse Practitioner

## 2017-09-01 DIAGNOSIS — I714 Abdominal aortic aneurysm, without rupture: Secondary | ICD-10-CM | POA: Diagnosis present

## 2017-09-01 DIAGNOSIS — N2889 Other specified disorders of kidney and ureter: Secondary | ICD-10-CM | POA: Insufficient documentation

## 2017-09-01 DIAGNOSIS — I713 Abdominal aortic aneurysm, ruptured, unspecified: Secondary | ICD-10-CM

## 2017-09-01 DIAGNOSIS — N281 Cyst of kidney, acquired: Secondary | ICD-10-CM | POA: Insufficient documentation

## 2017-09-01 DIAGNOSIS — I723 Aneurysm of iliac artery: Secondary | ICD-10-CM | POA: Diagnosis not present

## 2017-09-01 MED ORDER — IOPAMIDOL (ISOVUE-370) INJECTION 76%
100.0000 mL | Freq: Once | INTRAVENOUS | Status: AC | PRN
  Administered 2017-09-01: 100 mL via INTRAVENOUS

## 2017-09-02 ENCOUNTER — Telehealth (INDEPENDENT_AMBULATORY_CARE_PROVIDER_SITE_OTHER): Payer: Self-pay | Admitting: Nurse Practitioner

## 2017-09-02 ENCOUNTER — Inpatient Hospital Stay: Payer: Medicare PPO

## 2017-09-02 DIAGNOSIS — Z5112 Encounter for antineoplastic immunotherapy: Secondary | ICD-10-CM | POA: Diagnosis not present

## 2017-09-02 DIAGNOSIS — C9110 Chronic lymphocytic leukemia of B-cell type not having achieved remission: Secondary | ICD-10-CM

## 2017-09-02 DIAGNOSIS — Z7901 Long term (current) use of anticoagulants: Secondary | ICD-10-CM

## 2017-09-02 LAB — URIC ACID: URIC ACID, SERUM: 3.6 mg/dL — AB (ref 3.7–8.6)

## 2017-09-02 LAB — CBC WITH DIFFERENTIAL/PLATELET
Basophils Absolute: 0 10*3/uL (ref 0–0.1)
Basophils Relative: 1 %
EOS ABS: 0 10*3/uL (ref 0–0.7)
Eosinophils Relative: 0 %
HEMATOCRIT: 44.9 % (ref 40.0–52.0)
HEMOGLOBIN: 15.2 g/dL (ref 13.0–18.0)
LYMPHS ABS: 0.8 10*3/uL — AB (ref 1.0–3.6)
LYMPHS PCT: 15 %
MCH: 28.8 pg (ref 26.0–34.0)
MCHC: 33.7 g/dL (ref 32.0–36.0)
MCV: 85.4 fL (ref 80.0–100.0)
MONOS PCT: 9 %
Monocytes Absolute: 0.5 10*3/uL (ref 0.2–1.0)
NEUTROS PCT: 75 %
Neutro Abs: 4.2 10*3/uL (ref 1.4–6.5)
Platelets: 146 10*3/uL — ABNORMAL LOW (ref 150–440)
RBC: 5.26 MIL/uL (ref 4.40–5.90)
RDW: 17.8 % — ABNORMAL HIGH (ref 11.5–14.5)
WBC: 5.5 10*3/uL (ref 3.8–10.6)

## 2017-09-02 LAB — LACTATE DEHYDROGENASE: LDH: 175 U/L (ref 98–192)

## 2017-09-02 LAB — PROTIME-INR
INR: 1.89
PROTHROMBIN TIME: 21.5 s — AB (ref 11.4–15.2)

## 2017-09-02 LAB — COMPREHENSIVE METABOLIC PANEL
ALT: 21 U/L (ref 0–44)
AST: 34 U/L (ref 15–41)
Albumin: 4.3 g/dL (ref 3.5–5.0)
Alkaline Phosphatase: 97 U/L (ref 38–126)
Anion gap: 8 (ref 5–15)
BUN: 15 mg/dL (ref 8–23)
CHLORIDE: 106 mmol/L (ref 98–111)
CO2: 24 mmol/L (ref 22–32)
CREATININE: 0.92 mg/dL (ref 0.61–1.24)
Calcium: 9 mg/dL (ref 8.9–10.3)
GFR calc Af Amer: >60 mL/min (ref >60)
Glucose, Bld: 105 mg/dL — ABNORMAL HIGH (ref 70–99)
POTASSIUM: 3.9 mmol/L (ref 3.5–5.1)
SODIUM: 138 mmol/L (ref 135–145)
Total Bilirubin: 1.4 mg/dL — ABNORMAL HIGH (ref 0.3–1.2)
Total Protein: 7.8 g/dL (ref 6.5–8.1)

## 2017-09-02 MED FILL — VENCLEXTA 100 MG TABS: 100 | 30 days supply | Qty: 120 | Fill #0

## 2017-09-02 NOTE — Telephone Encounter (Signed)
I spoke with Martin Adkins directly by telephone regarding the results of his CT scan for his Evar leak.  I explained the pathophysiology of abdominal aortic aneurysm, as well as how they are generally repair and how an endoleak occurs.  Patient understood.  I spoke with him at length about repair options.  I discussed the risks, benefits, and  alternatives of undergoing an EVAR revision.  The patient understood and agreed to the procedure.  We will contact with procedure date and time, as well as preop appointment.

## 2017-09-09 ENCOUNTER — Other Ambulatory Visit: Payer: Self-pay | Admitting: Oncology

## 2017-09-09 ENCOUNTER — Inpatient Hospital Stay: Payer: Medicare PPO | Attending: Oncology

## 2017-09-09 ENCOUNTER — Telehealth: Payer: Self-pay | Admitting: *Deleted

## 2017-09-09 ENCOUNTER — Telehealth: Payer: Self-pay | Admitting: Pharmacist

## 2017-09-09 DIAGNOSIS — G4730 Sleep apnea, unspecified: Secondary | ICD-10-CM | POA: Insufficient documentation

## 2017-09-09 DIAGNOSIS — N400 Enlarged prostate without lower urinary tract symptoms: Secondary | ICD-10-CM | POA: Diagnosis not present

## 2017-09-09 DIAGNOSIS — I4891 Unspecified atrial fibrillation: Secondary | ICD-10-CM | POA: Diagnosis not present

## 2017-09-09 DIAGNOSIS — D3501 Benign neoplasm of right adrenal gland: Secondary | ICD-10-CM | POA: Insufficient documentation

## 2017-09-09 DIAGNOSIS — I714 Abdominal aortic aneurysm, without rupture: Secondary | ICD-10-CM | POA: Diagnosis not present

## 2017-09-09 DIAGNOSIS — R5383 Other fatigue: Secondary | ICD-10-CM | POA: Insufficient documentation

## 2017-09-09 DIAGNOSIS — Z5112 Encounter for antineoplastic immunotherapy: Secondary | ICD-10-CM | POA: Insufficient documentation

## 2017-09-09 DIAGNOSIS — E785 Hyperlipidemia, unspecified: Secondary | ICD-10-CM | POA: Diagnosis not present

## 2017-09-09 DIAGNOSIS — R531 Weakness: Secondary | ICD-10-CM | POA: Diagnosis not present

## 2017-09-09 DIAGNOSIS — D696 Thrombocytopenia, unspecified: Secondary | ICD-10-CM | POA: Insufficient documentation

## 2017-09-09 DIAGNOSIS — R0602 Shortness of breath: Secondary | ICD-10-CM | POA: Insufficient documentation

## 2017-09-09 DIAGNOSIS — E119 Type 2 diabetes mellitus without complications: Secondary | ICD-10-CM | POA: Insufficient documentation

## 2017-09-09 DIAGNOSIS — Z79899 Other long term (current) drug therapy: Secondary | ICD-10-CM | POA: Insufficient documentation

## 2017-09-09 DIAGNOSIS — E538 Deficiency of other specified B group vitamins: Secondary | ICD-10-CM | POA: Diagnosis not present

## 2017-09-09 DIAGNOSIS — Z8042 Family history of malignant neoplasm of prostate: Secondary | ICD-10-CM | POA: Insufficient documentation

## 2017-09-09 DIAGNOSIS — I2510 Atherosclerotic heart disease of native coronary artery without angina pectoris: Secondary | ICD-10-CM | POA: Insufficient documentation

## 2017-09-09 DIAGNOSIS — K760 Fatty (change of) liver, not elsewhere classified: Secondary | ICD-10-CM | POA: Insufficient documentation

## 2017-09-09 DIAGNOSIS — K219 Gastro-esophageal reflux disease without esophagitis: Secondary | ICD-10-CM | POA: Diagnosis not present

## 2017-09-09 DIAGNOSIS — R05 Cough: Secondary | ICD-10-CM | POA: Insufficient documentation

## 2017-09-09 DIAGNOSIS — F17210 Nicotine dependence, cigarettes, uncomplicated: Secondary | ICD-10-CM | POA: Insufficient documentation

## 2017-09-09 DIAGNOSIS — C9110 Chronic lymphocytic leukemia of B-cell type not having achieved remission: Secondary | ICD-10-CM | POA: Diagnosis present

## 2017-09-09 DIAGNOSIS — R161 Splenomegaly, not elsewhere classified: Secondary | ICD-10-CM | POA: Insufficient documentation

## 2017-09-09 DIAGNOSIS — K2270 Barrett's esophagus without dysplasia: Secondary | ICD-10-CM | POA: Diagnosis not present

## 2017-09-09 DIAGNOSIS — I110 Hypertensive heart disease with heart failure: Secondary | ICD-10-CM | POA: Diagnosis not present

## 2017-09-09 DIAGNOSIS — Z7901 Long term (current) use of anticoagulants: Secondary | ICD-10-CM | POA: Insufficient documentation

## 2017-09-09 DIAGNOSIS — I252 Old myocardial infarction: Secondary | ICD-10-CM | POA: Diagnosis not present

## 2017-09-09 DIAGNOSIS — Z7982 Long term (current) use of aspirin: Secondary | ICD-10-CM | POA: Insufficient documentation

## 2017-09-09 DIAGNOSIS — D6861 Antiphospholipid syndrome: Secondary | ICD-10-CM | POA: Diagnosis not present

## 2017-09-09 DIAGNOSIS — I739 Peripheral vascular disease, unspecified: Secondary | ICD-10-CM | POA: Insufficient documentation

## 2017-09-09 DIAGNOSIS — Z803 Family history of malignant neoplasm of breast: Secondary | ICD-10-CM | POA: Insufficient documentation

## 2017-09-09 DIAGNOSIS — I5022 Chronic systolic (congestive) heart failure: Secondary | ICD-10-CM | POA: Diagnosis not present

## 2017-09-09 DIAGNOSIS — M129 Arthropathy, unspecified: Secondary | ICD-10-CM | POA: Insufficient documentation

## 2017-09-09 DIAGNOSIS — Z86718 Personal history of other venous thrombosis and embolism: Secondary | ICD-10-CM | POA: Insufficient documentation

## 2017-09-09 LAB — CBC WITH DIFFERENTIAL/PLATELET
BASOS PCT: 1 %
Basophils Absolute: 0 10*3/uL (ref 0–0.1)
EOS ABS: 0 10*3/uL (ref 0–0.7)
Eosinophils Relative: 0 %
HEMATOCRIT: 42.7 % (ref 40.0–52.0)
HEMOGLOBIN: 14.5 g/dL (ref 13.0–18.0)
LYMPHS ABS: 0.8 10*3/uL — AB (ref 1.0–3.6)
Lymphocytes Relative: 19 %
MCH: 28.9 pg (ref 26.0–34.0)
MCHC: 33.9 g/dL (ref 32.0–36.0)
MCV: 85.2 fL (ref 80.0–100.0)
MONO ABS: 0.4 10*3/uL (ref 0.2–1.0)
MONOS PCT: 9 %
Neutro Abs: 3.1 10*3/uL (ref 1.4–6.5)
Neutrophils Relative %: 71 %
Platelets: 128 10*3/uL — ABNORMAL LOW (ref 150–440)
RBC: 5.01 MIL/uL (ref 4.40–5.90)
RDW: 16.9 % — AB (ref 11.5–14.5)
WBC: 4.3 10*3/uL (ref 3.8–10.6)

## 2017-09-09 LAB — URIC ACID: URIC ACID, SERUM: 2.6 mg/dL — AB (ref 3.7–8.6)

## 2017-09-09 LAB — COMPREHENSIVE METABOLIC PANEL
ALBUMIN: 4.2 g/dL (ref 3.5–5.0)
ALK PHOS: 82 U/L (ref 38–126)
ALT: 25 U/L (ref 0–44)
AST: 34 U/L (ref 15–41)
Anion gap: 9 (ref 5–15)
BUN: 19 mg/dL (ref 8–23)
CALCIUM: 8.5 mg/dL — AB (ref 8.9–10.3)
CHLORIDE: 104 mmol/L (ref 98–111)
CO2: 23 mmol/L (ref 22–32)
CREATININE: 0.87 mg/dL (ref 0.61–1.24)
GFR calc non Af Amer: >60 mL/min (ref >60)
GLUCOSE: 99 mg/dL (ref 70–99)
Potassium: 4.4 mmol/L (ref 3.5–5.1)
Sodium: 136 mmol/L (ref 135–145)
Total Bilirubin: 1.8 mg/dL — ABNORMAL HIGH (ref 0.3–1.2)
Total Protein: 7.4 g/dL (ref 6.5–8.1)

## 2017-09-09 LAB — LACTATE DEHYDROGENASE: LDH: 161 U/L (ref 98–192)

## 2017-09-09 LAB — PROTIME-INR
INR: 2.4
Prothrombin Time: 26 seconds — ABNORMAL HIGH (ref 11.4–15.2)

## 2017-09-09 NOTE — Telephone Encounter (Signed)
Oral Chemotherapy Pharmacist Encounter   Called Martin Adkins to check in on him. He stated he was doing well since dose increasing his venetoclax dose to 400mg  on Monday 09/08/17. His TLS labs from 09/09/17 looked stable.   Additionally, Martin Adkins want to make sure his cardiology office was aware he had his IN[facility]. I call his cardiology office to inform him that his INR lab work was completed this morning.   Darl Pikes, PharmD, BCPS, [facility] Hematology/Oncology Clinical Pharmacist [facility] [phone]  09/09/2017 12:42 PM

## 2017-09-09 NOTE — Telephone Encounter (Signed)
PT/INR added to labs.

## 2017-09-09 NOTE — Telephone Encounter (Signed)
Dr Nehemiah Massed office requesting that we add a physical therapy/ INR to Tuesdays labs for them 09/16/17

## 2017-09-15 ENCOUNTER — Encounter (INDEPENDENT_AMBULATORY_CARE_PROVIDER_SITE_OTHER): Payer: Self-pay

## 2017-09-15 ENCOUNTER — Other Ambulatory Visit: Payer: Self-pay | Admitting: Oncology

## 2017-09-15 ENCOUNTER — Other Ambulatory Visit (INDEPENDENT_AMBULATORY_CARE_PROVIDER_SITE_OTHER): Payer: Self-pay | Admitting: Nurse Practitioner

## 2017-09-15 DIAGNOSIS — C9110 Chronic lymphocytic leukemia of B-cell type not having achieved remission: Secondary | ICD-10-CM

## 2017-09-16 ENCOUNTER — Encounter: Payer: Self-pay | Admitting: Oncology

## 2017-09-16 ENCOUNTER — Other Ambulatory Visit: Payer: Self-pay

## 2017-09-16 ENCOUNTER — Inpatient Hospital Stay: Payer: Medicare PPO

## 2017-09-16 ENCOUNTER — Inpatient Hospital Stay (HOSPITAL_BASED_OUTPATIENT_CLINIC_OR_DEPARTMENT_OTHER): Payer: Medicare PPO | Admitting: Oncology

## 2017-09-16 VITALS — BP 130/85 | HR 80 | Temp 97.0°F | Resp 16 | Wt 206.4 lb

## 2017-09-16 VITALS — BP 101/63

## 2017-09-16 DIAGNOSIS — Z79899 Other long term (current) drug therapy: Secondary | ICD-10-CM

## 2017-09-16 DIAGNOSIS — D6861 Antiphospholipid syndrome: Secondary | ICD-10-CM

## 2017-09-16 DIAGNOSIS — K219 Gastro-esophageal reflux disease without esophagitis: Secondary | ICD-10-CM

## 2017-09-16 DIAGNOSIS — Z7901 Long term (current) use of anticoagulants: Secondary | ICD-10-CM

## 2017-09-16 DIAGNOSIS — E785 Hyperlipidemia, unspecified: Secondary | ICD-10-CM

## 2017-09-16 DIAGNOSIS — N400 Enlarged prostate without lower urinary tract symptoms: Secondary | ICD-10-CM

## 2017-09-16 DIAGNOSIS — Z8042 Family history of malignant neoplasm of prostate: Secondary | ICD-10-CM

## 2017-09-16 DIAGNOSIS — D3501 Benign neoplasm of right adrenal gland: Secondary | ICD-10-CM

## 2017-09-16 DIAGNOSIS — M129 Arthropathy, unspecified: Secondary | ICD-10-CM

## 2017-09-16 DIAGNOSIS — R5383 Other fatigue: Secondary | ICD-10-CM

## 2017-09-16 DIAGNOSIS — Z5111 Encounter for antineoplastic chemotherapy: Secondary | ICD-10-CM

## 2017-09-16 DIAGNOSIS — R0602 Shortness of breath: Secondary | ICD-10-CM

## 2017-09-16 DIAGNOSIS — R531 Weakness: Secondary | ICD-10-CM

## 2017-09-16 DIAGNOSIS — I5022 Chronic systolic (congestive) heart failure: Secondary | ICD-10-CM

## 2017-09-16 DIAGNOSIS — D696 Thrombocytopenia, unspecified: Secondary | ICD-10-CM | POA: Diagnosis not present

## 2017-09-16 DIAGNOSIS — C9110 Chronic lymphocytic leukemia of B-cell type not having achieved remission: Secondary | ICD-10-CM

## 2017-09-16 DIAGNOSIS — I2510 Atherosclerotic heart disease of native coronary artery without angina pectoris: Secondary | ICD-10-CM

## 2017-09-16 DIAGNOSIS — K2270 Barrett's esophagus without dysplasia: Secondary | ICD-10-CM

## 2017-09-16 DIAGNOSIS — E538 Deficiency of other specified B group vitamins: Secondary | ICD-10-CM

## 2017-09-16 DIAGNOSIS — G4730 Sleep apnea, unspecified: Secondary | ICD-10-CM

## 2017-09-16 DIAGNOSIS — I714 Abdominal aortic aneurysm, without rupture: Secondary | ICD-10-CM

## 2017-09-16 DIAGNOSIS — E119 Type 2 diabetes mellitus without complications: Secondary | ICD-10-CM

## 2017-09-16 DIAGNOSIS — Z5112 Encounter for antineoplastic immunotherapy: Secondary | ICD-10-CM

## 2017-09-16 DIAGNOSIS — K760 Fatty (change of) liver, not elsewhere classified: Secondary | ICD-10-CM

## 2017-09-16 DIAGNOSIS — Z7982 Long term (current) use of aspirin: Secondary | ICD-10-CM

## 2017-09-16 DIAGNOSIS — C9190 Lymphoid leukemia, unspecified not having achieved remission: Secondary | ICD-10-CM | POA: Diagnosis not present

## 2017-09-16 DIAGNOSIS — I110 Hypertensive heart disease with heart failure: Secondary | ICD-10-CM

## 2017-09-16 DIAGNOSIS — R05 Cough: Secondary | ICD-10-CM

## 2017-09-16 DIAGNOSIS — Z86718 Personal history of other venous thrombosis and embolism: Secondary | ICD-10-CM

## 2017-09-16 DIAGNOSIS — I252 Old myocardial infarction: Secondary | ICD-10-CM

## 2017-09-16 DIAGNOSIS — I739 Peripheral vascular disease, unspecified: Secondary | ICD-10-CM

## 2017-09-16 DIAGNOSIS — Z803 Family history of malignant neoplasm of breast: Secondary | ICD-10-CM

## 2017-09-16 DIAGNOSIS — R161 Splenomegaly, not elsewhere classified: Secondary | ICD-10-CM

## 2017-09-16 DIAGNOSIS — F17210 Nicotine dependence, cigarettes, uncomplicated: Secondary | ICD-10-CM

## 2017-09-16 DIAGNOSIS — I240 Acute coronary thrombosis not resulting in myocardial infarction: Secondary | ICD-10-CM

## 2017-09-16 DIAGNOSIS — I4891 Unspecified atrial fibrillation: Secondary | ICD-10-CM

## 2017-09-16 LAB — PROTIME-INR
INR: 2.24
PROTHROMBIN TIME: 24.6 s — AB (ref 11.4–15.2)

## 2017-09-16 LAB — CBC WITH DIFFERENTIAL/PLATELET
Basophils Absolute: 0 10*3/uL (ref 0–0.1)
Basophils Relative: 1 %
Eosinophils Absolute: 0 10*3/uL (ref 0–0.7)
Eosinophils Relative: 0 %
HEMATOCRIT: 44 % (ref 40.0–52.0)
Hemoglobin: 15 g/dL (ref 13.0–18.0)
LYMPHS ABS: 0.7 10*3/uL — AB (ref 1.0–3.6)
LYMPHS PCT: 18 %
MCH: 29.3 pg (ref 26.0–34.0)
MCHC: 34.1 g/dL (ref 32.0–36.0)
MCV: 85.9 fL (ref 80.0–100.0)
MONOS PCT: 5 %
Monocytes Absolute: 0.2 10*3/uL (ref 0.2–1.0)
NEUTROS ABS: 2.9 10*3/uL (ref 1.4–6.5)
NEUTROS PCT: 76 %
Platelets: 98 10*3/uL — ABNORMAL LOW (ref 150–440)
RBC: 5.12 MIL/uL (ref 4.40–5.90)
RDW: 17.1 % — ABNORMAL HIGH (ref 11.5–14.5)
WBC: 3.8 10*3/uL (ref 3.8–10.6)

## 2017-09-16 LAB — LACTATE DEHYDROGENASE: LDH: 162 U/L (ref 98–192)

## 2017-09-16 LAB — COMPREHENSIVE METABOLIC PANEL
ALT: 28 U/L (ref 0–44)
AST: 41 U/L (ref 15–41)
Albumin: 4.3 g/dL (ref 3.5–5.0)
Alkaline Phosphatase: 98 U/L (ref 38–126)
Anion gap: 10 (ref 5–15)
BUN: 19 mg/dL (ref 8–23)
CHLORIDE: 105 mmol/L (ref 98–111)
CO2: 23 mmol/L (ref 22–32)
CREATININE: 0.88 mg/dL (ref 0.61–1.24)
Calcium: 9 mg/dL (ref 8.9–10.3)
GFR calc non Af Amer: >60 mL/min (ref >60)
Glucose, Bld: 131 mg/dL — ABNORMAL HIGH (ref 70–99)
POTASSIUM: 4 mmol/L (ref 3.5–5.1)
SODIUM: 138 mmol/L (ref 135–145)
Total Bilirubin: 1.8 mg/dL — ABNORMAL HIGH (ref 0.3–1.2)
Total Protein: 7.5 g/dL (ref 6.5–8.1)

## 2017-09-16 LAB — URIC ACID: URIC ACID, SERUM: 3.7 mg/dL (ref 3.7–8.6)

## 2017-09-16 LAB — PHOSPHORUS: Phosphorus: 2.5 mg/dL (ref 2.5–4.6)

## 2017-09-16 MED ORDER — SODIUM CHLORIDE 0.9 % IV SOLN
1000.0000 mg | Freq: Once | INTRAVENOUS | Status: AC
  Administered 2017-09-16: 1000 mg via INTRAVENOUS
  Filled 2017-09-16: qty 40

## 2017-09-16 MED ORDER — FAMOTIDINE IN NACL 20-0.9 MG/50ML-% IV SOLN
20.0000 mg | Freq: Two times a day (BID) | INTRAVENOUS | Status: DC
  Administered 2017-09-16: 20 mg via INTRAVENOUS
  Filled 2017-09-16: qty 50

## 2017-09-16 MED ORDER — SODIUM CHLORIDE 0.9 % IV SOLN
Freq: Once | INTRAVENOUS | Status: AC
  Administered 2017-09-16: 10:00:00 via INTRAVENOUS
  Filled 2017-09-16: qty 250

## 2017-09-16 MED ORDER — ACETAMINOPHEN 325 MG PO TABS
650.0000 mg | ORAL_TABLET | Freq: Once | ORAL | Status: AC
  Administered 2017-09-16: 650 mg via ORAL
  Filled 2017-09-16: qty 2

## 2017-09-16 MED ORDER — DIPHENHYDRAMINE HCL 50 MG/ML IJ SOLN
50.0000 mg | Freq: Once | INTRAMUSCULAR | Status: AC
  Administered 2017-09-16: 50 mg via INTRAVENOUS
  Filled 2017-09-16: qty 1

## 2017-09-16 MED ORDER — SODIUM CHLORIDE 0.9 % IV SOLN
20.0000 mg | Freq: Once | INTRAVENOUS | Status: AC
  Administered 2017-09-16: 20 mg via INTRAVENOUS
  Filled 2017-09-16: qty 2

## 2017-09-16 NOTE — Progress Notes (Signed)
Patient here today for follow up and treatment.

## 2017-09-16 NOTE — Progress Notes (Signed)
Hematology/Oncology Follow up note [facility] Telephone:(336) [phone] [phone]   Patient Care Team: [name], MD as PCP - General (Internal Medicine) Earlie Server, MD as Medical Oncologist (Medical Oncology)  REFERRING PROVIDER: [name], MD  REASON FOR VISIT Follow up for management of CLL HISTORY OF PRESENTING ILLNESS:  Martin Adkins is a  72 y.o.  male with PMH listed below who was referred to me for evaluation of lymphocytosis.  Recent lab work on 03/05/2017 showed wbc 11.4, hb 14.6, platelet count 117,000, lymphocytosis 63.5%, neutrophil 22%, Report chronic fatigue, no weight loss, fever or chills.  He reports feeling chest "rattleness". Has for CT chest which showed acute finding, chronic finding includes  postsurgical changes consistent with coronary bypass grafting. One of the bypass grafts arising from the aorta shows apparent thrombosis and an area of distal aneurysmal dilatation which is also Thrombosed. Right adrenal adenoma stable in appearance.Mild scarring in the lung bases.Fatty liver.   Takes Aspirin 18m, coumadine, plavix. He is on anticoagulation for previous history of clots.  Denies any bleeding events. Use to drink hard liquir daily, quitted for 4-5 years. Current drinks wine on weekend.    Image Studies # 04/04/2017  [place]dom[place] showed 1.  Splenomegaly.  No focal splenic lesions evident.2. Increased liver echogenicity, a finding most likely indicative of hepatic steatosis. While no focal liver lesions are evident on this study, it must be cautioned that the sensitivity of ultrasound for detection of focal liver lesions is diminished in this circumstance.3. Pancreas obscured by gas. Portions of inferior vena cava obscured by gas.4.  Gallbladder absent.5. Status post abdominal aortic aneurysm repair. No periaortic fluid.  # 04/21/2017 PET scan 1. Moderate splenomegaly with uniform splenic hypermetabolism,compatible  with the provided history of lymphoproliferative disease.No additional hypermetabolic sites of lymphoproliferative disease. Specifically, no hypermetabolic lymphadenopathy Hepatic steatosis, aortic atherosclerosis. Aneurysm.    # CLL, stage IV disease given spelencemagly, thrombocytopenia, symptomatic with fatigue and weight loss.  CLL IPL score 4, high risk.   INTERVAL HISTORY Martin JCAIGE ALMEDAis a 72y.o. male who has above presents for assessment prior to Cycle 5 Gazyva for treatment of Stage IV CLL, and also assessment of toxicity of Venetoclax.   He has been through the Venetoclax dose ramp up protocol and has done well. No signs of tumor lysis.  He currently takes Venetoclax 201mdaily.   # He follows up with cardiology/PCP for coumadin dosing with INR. Today is INR is stable at 2.24.  He has AAA stent graft endo leak and  needs to have a procedure done with vascular surgeon next week and was cleared by Cardiology for temporarily held warfarin for 3 days prior to procedure.  He denies any bleeding event. Report easy bruising.  # Fatigue has improved.  # Weight has been stable. Chronic cough and SOB at baseline.    Review of Systems  Constitutional: Positive for malaise/fatigue. Negative for chills, fever and weight loss.  HENT: Negative for congestion, ear discharge, ear pain, nosebleeds, sinus pain and sore throat.   Eyes: Negative for double vision, photophobia, pain, discharge and redness.  Respiratory: Positive for shortness of breath. Negative for cough, hemoptysis, sputum production and wheezing.   Cardiovascular: Negative for chest pain, palpitations, orthopnea, claudication and leg swelling.  Gastrointestinal: Negative for abdominal pain, blood in stool, constipation, diarrhea, heartburn, melena, nausea and vomiting.  Genitourinary: Negative for dysuria, flank pain, frequency, hematuria and urgency.  Musculoskeletal: Negative for back pain, myalgias  and neck pain.  Skin:  Negative for itching and rash.  Neurological: Negative for dizziness, tingling, tremors, speech change, focal weakness, weakness and headaches.  Endo/Heme/Allergies: Negative for environmental allergies. Bruises/bleeds easily.  Psychiatric/Behavioral: Negative for depression, hallucinations and suicidal ideas. The patient is not nervous/anxious.     MEDICAL HISTORY:  Past Medical History:  Diagnosis Date  . AAA (abdominal aortic aneurysm) without rupture ([place]) 04/05/2014  . AAA (abdominal aortic aneurysm) without rupture ([place]) 04/05/2014  . Abdominal aortic aneurysm without rupture ([place])   . Acute non-ST elevation myocardial infarction (NSTEMI) ([place]) 08/15/2014  . 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/28/2014  . APS (antiphospholipid syndrome) ([place])   . Arteriosclerosis of coronary artery 04/05/2014   Overview:  Sp cabg with lima to lad svg to d1 om1 rca 2004 with occluded svg to rca and d1 and pci stetn of om1 graft 2015   . Arthritis   . B12 deficiency 03/21/2017  . Barrett's esophagus   . Benign essential HTN 05/02/2014  . BPH (benign prostatic hyperplasia)   . CAD (coronary artery disease)   . CHF (congestive heart failure) ([place])   . Chronic systolic heart failure ([place]) 04/19/2014   Overview:  With segmental LV systolic dysfunction ejection fraction of 35%   . CLL (chronic lymphocytic leukemia) ([place])   . CLL (chronic lymphocytic leukemia) ([place]) 05/24/2017  . Diabetes ([place])   . DVT (deep venous thrombosis) ([place])   . Dysrhythmia    ATRIAL FIB.  Marland Kitchen GERD (gastroesophageal reflux disease)   . History of hiatal hernia   . History of shingles 2013  . Hyperlipemia   . Hyperplastic polyps of stomach   . Hypertension   . Myocardial infarction ([place]) 07/30/2014  . NSTEMI (non-ST elevated myocardial infarction) ([place])   . OSA (obstructive sleep apnea)   . Osteoarthritis   . PVD (peripheral vascular disease) ([place])   . RA (rheumatoid arthritis) ([place])   . SOB (shortness of breath)     SURGICAL  HISTORY: Past Surgical History:  Procedure Laterality Date  . ABDOMINAL AORTA STENT    . CHOLECYSTECTOMY    . COLONOSCOPY  11/24/2009  . CORONARY ANGIOPLASTY WITH STENT PLACEMENT  2015  . CORONARY ARTERY BYPASS GRAFT    . ESOPHAGOGASTRODUODENOSCOPY  05/26/02  03/23/12  . ESOPHAGOGASTRODUODENOSCOPY (EGD) WITH PROPOFOL N/A 10/28/2016   Procedure: ESOPHAGOGASTRODUODENOSCOPY (EGD) WITH PROPOFOL;  Surgeon: Manya Silvas, MD;  [facility] END[facility]COPY;  Service: Endoscopy;  Laterality: N/A;  . PERIPHERAL VASCULAR CATHETERIZATION N/A 09/06/2014   Procedure: IVC Filter Removal;  Surgeon: Katha Cabal, MD;  Location: [place] CV LAB;  Service: Cardiovascular;  Laterality: N/A;  . TRANSURETHRAL RESECTION OF PROSTATE N/A 02/20/2015   Procedure: TRANSURETHRAL RESECTION OF THE PROSTATE (TURP);  Surgeon: Hollice Espy, MD;  Location: [facility] ORS;  Service: Urology;  Laterality: N/A;    SOCIAL HISTORY: Social History   Socioeconomic History  . Marital status: Legally Separated    Spouse name: Not on file  . Number of children: Not on file  . Years of education: Not on file  . Highest education level: Not on file  Occupational History  . Not on file  Social Needs  . Financial resource strain: Not on file  . Food insecurity:    Worry: Not on file    Inability: Not on file  . Transportation needs:    Medical: Not on file    Non-medical: Not on file  Tobacco Use  . Smoking status: Current Some Day Smoker  Packs/day: 0.25    Years: 20.00    Pack years: 5.00    Types: Cigarettes  . Smokeless tobacco: Never Used  . Tobacco comment: per pt nicotine patch number23m -smokes 1-3 cig/per d now  Substance and Sexual Activity  . Alcohol use: Yes    Alcohol/week: 0.0 standard drinks    Comment: social on weekends  . Drug use: No  . Sexual activity: Never  Lifestyle  . Physical activity:    Days per week: Not on file    Minutes per session: Not on file  . Stress: Not on file    Relationships  . Social connections:    Talks on phone: Not on file    Gets together: Not on file    Attends religious service: Not on file    Active member of club or organization: Not on file    Attends meetings of clubs or organizations: Not on file    Relationship status: Not on file  . Intimate partner violence:    Fear of current or ex partner: Not on file    Emotionally abused: Not on file    Physically abused: Not on file    Forced sexual activity: Not on file  Other Topics Concern  . Not on file  Social History Narrative  . Not on file    FAMILY HISTORY: Family History  Problem Relation Age of Onset  . Cancer Mother 73      breast ca  . Diabetes Mother   . Coronary artery disease Father   . Heart attack Father   . Prostate cancer Brother   . Bladder Cancer Neg Hx   . Kidney cancer Neg Hx     ALLERGIES:  is allergic to ace inhibitors; pravastatin; rosuvastatin; simvastatin; amoxicillin; niacin; and penicillins.  MEDICATIONS:  Current Outpatient Medications  Medication Sig Dispense Refill  . albuterol (PROVENTIL HFA;VENTOLIN HFA) 108 (90 Base) MCG/ACT inhaler Inhale 2 puffs into the lungs every 6 (six) hours as needed for wheezing or shortness of breath. 1 Inhaler 0  . allopurinol (ZYLOPRIM) 300 MG tablet Take 1 tablet (300 mg total) by mouth daily. 30 tablet 0  . amLODipine (NORVASC) 10 MG tablet Take 10 mg by mouth daily.     .Marland Kitchenaspirin EC 81 MG tablet Take 81 mg by mouth daily.    .Marland Kitchenatorvastatin (LIPITOR) 80 MG tablet TAKE 1 TABLET (80 MG TOTAL) BY MOUTH ONCE DAILY.  11  . docusate sodium (COLACE) 100 MG capsule Take 1 capsule (100 mg total) by mouth 2 (two) times daily. 60 capsule 0  . fluticasone (FLONASE) 50 MCG/ACT nasal spray Place 2 sprays into both nostrils daily.     . furosemide (LASIX) 20 MG tablet Take 20 mg by mouth daily.  11  . gabapentin (NEURONTIN) 300 MG capsule Take 1 capsule (300 mg total) by mouth 3 (three) times daily. 90 capsule 0  .  hydroxychloroquine (PLAQUENIL) 200 MG tablet Take 1 tablet by mouth daily.    . Ipratropium-Albuterol (COMBIVENT) 20-100 MCG/ACT AERS respimat Inhale 1 puff into the lungs every 6 (six) hours. 1 Inhaler 11  . isosorbide mononitrate (IMDUR) 30 MG 24 hr tablet Take 30 mg by mouth daily.     .Marland Kitchenlosartan (COZAAR) 100 MG tablet 100 mg daily.     .Phillis KnackEYE DROPS 0.4-0.3 % SOLN Apply 1 drop to eye daily as needed.     . metoprolol succinate (TOPROL-XL) 100 MG 24 hr tablet Take 100 mg  by mouth daily.     . montelukast (SINGULAIR) 10 MG tablet Take 1 tablet (10 mg total) by mouth as needed. Take before Gazyva infusion. 20 tablet 0  . pantoprazole (PROTONIX) 40 MG tablet Take 40 mg by mouth daily.     . potassium chloride SA (K-DUR,KLOR-CON) 20 MEQ tablet Take 20 mEq by mouth daily.     Marland Kitchen venetoclax 100 MG TABS Take 400 mg by mouth daily. Take with food and a full glass of water. 120 tablet 2  . vitamin B-12 (CYANOCOBALAMIN) 100 MCG tablet Take 1 tablet (100 mcg total) by mouth daily. 90 tablet 1  . warfarin (COUMADIN) 1 MG tablet Take by mouth daily. Patient takes once a week with 59m tablet for a total of 6 mg per day    . warfarin (COUMADIN) 5 MG tablet TAKE 1 TABLET (5 MG TOTAL) BY MOUTH ONCE DAILY.  5   No current facility-administered medications for this visit.    Facility-Administered Medications Ordered in Other Visits  Medication Dose Route Frequency Provider Last Rate Last Dose  . famotidine (PEPCID) IVPB 20 mg premix  20 mg Intravenous Q12H YEarlie Server MD   Stopped at 09/16/17 1032     PHYSICAL EXAMINATION: ECOG 1 Vitals:   09/16/17 0842  BP: 130/85  Pulse: 80  Resp: 16  Temp: (!) 97 F (36.1 C)  Physical Exam  Constitutional: He is oriented to person, place, and time and well-developed, well-nourished, and in no distress. No distress.  HENT:  Head: Normocephalic and atraumatic.  Nose: Nose normal.  Mouth/Throat: Oropharynx is clear and moist. No oropharyngeal exudate.    Eyes: Pupils are equal, round, and reactive to light. EOM are normal. Left eye exhibits no discharge. No scleral icterus.  Neck: Normal range of motion. Neck supple. No JVD present.  Cardiovascular: Normal rate, regular rhythm and normal heart sounds.  No murmur heard. Pulmonary/Chest: Effort normal and breath sounds normal. No respiratory distress. He has no wheezes. He has no rales. He exhibits no tenderness.  Abdominal: Soft. He exhibits no distension and no mass. There is no tenderness.  Musculoskeletal: Normal range of motion. He exhibits no edema, tenderness or deformity.  Lymphadenopathy:    He has no cervical adenopathy.  Neurological: He is alert and oriented to person, place, and time. No cranial nerve deficit. He exhibits normal muscle tone. Coordination normal.  Skin: Skin is warm and dry. No rash noted. He is not diaphoretic. No erythema.  Psychiatric: Affect and judgment normal.  Nursing note and vitals reviewed.    LABORATORY DATA:  I have reviewed the data as listed Lab Results  Component Value Date   WBC 3.8 09/16/2017   HGB 15.0 09/16/2017   HCT 44.0 09/16/2017   MCV 85.9 09/16/2017   PLT 98 (L) 09/16/2017   Recent Labs    04/09/17 1155  09/02/17 1036 09/09/17 1044 09/16/17 0802  NA  --    < > 138 136 138  K  --    < > 3.9 4.4 4.0  CL  --    < > 106 104 105  CO2  --    < > '24 23 23  ' GLUCOSE  --    < > 105* 99 131*  BUN  --    < > '15 19 19  ' CREATININE  --    < > 0.92 0.87 0.88  CALCIUM  --    < > 9.0 8.5* 9.0  GFRNONAA  --    < > >  60 >60 >60  GFRAA  --    < > >60 >60 >60  PROT  --    < > 7.8 7.4 7.5  ALBUMIN  --    < > 4.3 4.2 4.3  AST  --    < > 34 34 41  ALT  --    < > '21 25 28  ' ALKPHOS  --    < > 97 82 98  BILITOT 1.7*   < > 1.4* 1.8* 1.8*  BILIDIR 0.3  --   --   --   --    < > = values in this interval not displayed.   Hepatitis panel negative.  LDH 228, mildly elevated Beta 2 microglobulin normal.   Bone marrow biopsy  Showed  hypercellular marrow involved with non Hodgkin's B cell lymphoma. The morphologic and immunophenotypic features are most consistent with chronic lymphocytic leukemia/small lymphocytic lymphoma. Bone marrow Cytogenetics : normal.  Peripheral blood FISH panel negative for CCND1- IGH mutation, ATM, 12, 13q, Tp53 mutation.   RADIOGRAPHIC STUDIES: I have personally reviewed the radiological images as listed and agreed with the findings in the report. CT angio abd/pelvis showed Type 1b endoleak, AAA aneurysm, Spleen has decreased in size since PET scan in April 2019.   ASSESSMENT & PLAN:  72 yo male presents for immunotherapy treatment of Stage IV CLL. 1. CLL (chronic lymphocytic leukemia) ([place])   2. Long term current use of anticoagulant therapy   3. Encounter for antineoplastic chemotherapy   4. Encounter for antineoplastic immunotherapy   5. Thrombocytopenia ([place])   6. Other fatigue    # CLL:  Labs reviewed and discussed with patient. Acceptable to proceed with [facility] treatment today.  He has tolerated Venotocalx with no signs of tumor lysis syndrome.  Proceed with 2 hours of IVF today. Continue allopurinol for now.  Clinically he has responded well, spleen no longer palpable.  Recent CT abd/pelvis Angiogram was independently reviewed and discussed with patient. Spleen has confirmed to have decreased in size, indicating good response to treatment.   # Thrombocytopenia, Grade 1 due to Venetoclax.  As he is going to have a vascular procedure done, will proceed with dose reduction of Venetoclax 322m daily for now.  Check cbc weekly.   #Fatigue; stable, continue to monitor. #.Lymphopenia, stable. Continue to monitor.   # Fatigue, stable.   We spent sufficient time to discuss many aspect of care, questions were answered to patient's satisfaction.  The patient knows to call the clinic with any problems questions or concerns.  Return of visit: cbc in one week. Follow up in 2 weeks for  assessment.    ZEarlie Server MD, PhD Hematology Oncology CColusa Regional Medical Centerat ADoctors Memorial HospitalPager- [phone]/10/2017

## 2017-09-16 NOTE — Progress Notes (Signed)
Plt 98.  MD ok to proceed with treatment

## 2017-09-17 ENCOUNTER — Other Ambulatory Visit: Payer: Self-pay

## 2017-09-17 ENCOUNTER — Encounter
Admission: RE | Admit: 2017-09-17 | Discharge: 2017-09-17 | Disposition: A | Discharge status: Home / Self Care | Payer: Medicare PPO | Source: Ambulatory Visit | Attending: Vascular Surgery | Admitting: Vascular Surgery

## 2017-09-17 ENCOUNTER — Other Ambulatory Visit: Payer: Self-pay | Admitting: *Deleted

## 2017-09-17 DIAGNOSIS — I714 Abdominal aortic aneurysm, without rupture: Secondary | ICD-10-CM | POA: Insufficient documentation

## 2017-09-17 DIAGNOSIS — Z01818 Encounter for other preprocedural examination: Secondary | ICD-10-CM | POA: Insufficient documentation

## 2017-09-17 HISTORY — DX: Prediabetes: R73.03

## 2017-09-17 LAB — BASIC METABOLIC PANEL
Anion gap: 6 (ref 5–15)
BUN: 18 mg/dL (ref 8–23)
CHLORIDE: 106 mmol/L (ref 98–111)
CO2: 26 mmol/L (ref 22–32)
CREATININE: 0.81 mg/dL (ref 0.61–1.24)
Calcium: 9.1 mg/dL (ref 8.9–10.3)
GFR calc Af Amer: >60 mL/min (ref >60)
GFR calc non Af Amer: >60 mL/min (ref >60)
GLUCOSE: 110 mg/dL — AB (ref 70–99)
POTASSIUM: 4.5 mmol/L (ref 3.5–5.1)
Sodium: 138 mmol/L (ref 135–145)

## 2017-09-17 LAB — CBC WITH DIFFERENTIAL/PLATELET
Basophils Absolute: 0 10*3/uL (ref 0–0.1)
Basophils Relative: 0 %
Eosinophils Absolute: 0 10*3/uL (ref 0–0.7)
Eosinophils Relative: 0 %
HCT: 42.9 % (ref 40.0–52.0)
Hemoglobin: 14.8 g/dL (ref 13.0–18.0)
LYMPHS ABS: 0.5 10*3/uL — AB (ref 1.0–3.6)
LYMPHS PCT: 6 %
MCH: 29.7 pg (ref 26.0–34.0)
MCHC: 34.5 g/dL (ref 32.0–36.0)
MCV: 86.1 fL (ref 80.0–100.0)
MONOS PCT: 6 %
Monocytes Absolute: 0.5 10*3/uL (ref 0.2–1.0)
Neutro Abs: 6.9 10*3/uL — ABNORMAL HIGH (ref 1.4–6.5)
Neutrophils Relative %: 88 %
PLATELETS: 116 10*3/uL — AB (ref 150–440)
RBC: 4.99 MIL/uL (ref 4.40–5.90)
RDW: 17.3 % — ABNORMAL HIGH (ref 11.5–14.5)
WBC: 7.9 10*3/uL (ref 3.8–10.6)

## 2017-09-17 LAB — APTT: aPTT: 34 seconds (ref 24–36)

## 2017-09-17 LAB — TYPE AND SCREEN
ABO/RH(D): A POS
ANTIBODY SCREEN: NEGATIVE

## 2017-09-17 LAB — PROTIME-INR
INR: 2.38
PROTHROMBIN TIME: 25.8 s — AB (ref 11.4–15.2)

## 2017-09-17 MED ORDER — ALLOPURINOL 300 MG PO TABS: 300.0000 mg | ORAL_TABLET | Freq: Every day | ORAL | 0 refills | Status: DC

## 2017-09-17 NOTE — Progress Notes (Signed)
He has platelets of 116, with upcoming EVAR repair.  Did you want any platelets or anything ordered?Thanks!

## 2017-09-17 NOTE — Patient Instructions (Signed)
Your procedure is scheduled on: Wednesday 09/24/17 Report to [facility]. To find out your arrival time please call [phone] between 1PM - 3PM on Tuesday 09/23/17.  Remember: Instructions that are not followed completely may result in serious medical risk, up to and including death, or upon the discretion of your surgeon and anesthesiologist your surgery may need to be rescheduled.     _X__ 1. Do not eat food after midnight the night before your procedure.                 No gum chewing or hard candies. You may drink clear liquids up to 2 hours                 before you are scheduled to arrive for your surgery- DO not drink clear                 liquids within 2 hours of the start of your surgery.                 Clear Liquids include:  water, apple juice without pulp, clear carbohydrate                 drink such as Clearfast or Gatorade, Black Coffee or Tea (Do not add                 anything to coffee or tea).  __X__2.  On the morning of surgery brush your teeth with toothpaste and water, you                 may rinse your mouth with mouthwash if you wish.  Do not swallow any              toothpaste of mouthwash.     _X__ 3.  No Alcohol for 24 hours before or after surgery.   _X__ 4.  Do Not Smoke or use e-cigarettes For 24 Hours Prior to Your Surgery.                 Do not use any chewable tobacco products for at least 6 hours prior to                 surgery.  ____  5.  Bring all medications with you on the day of surgery if instructed.   __X__  6.  Notify your doctor if there is any change in your medical condition      (cold, fever, infections).     Do not wear jewelry, make-up, hairpins, clips or nail polish. Do not wear lotions, powders, or perfumes.  Do not shave 48 hours prior to surgery. Men may shave face and neck. Do not bring valuables to the hospital.    [facility] is not responsible for any belongings or  valuables.  Contacts, dentures/partials or body piercings may not be worn into surgery. Bring a case for your contacts, glasses or hearing aids, a denture cup will be supplied. Leave your suitcase in the car. After surgery it may be brought to your room. For patients admitted to the hospital, discharge time is determined by your treatment team.   Patients discharged the day of surgery will not be allowed to drive home.   Please read over the following fact sheets that you were given:   MRSA Information  __X__ Take these medicines the morning of surgery with A SIP OF WATER:  1. allopurinol (ZYLOPRIM)    2. amLODipine (NORVASC)  3. atorvastatin (LIPITOR)   4. gabapentin (NEURONTIN)  5. isosorbide mononitrate (IMDUR)   6. metoprolol succinate (TOPROL-XL)   7. pantoprazole (PROTONIX)  ____ Fleet Enema (as directed)   __X__ Use CHG Soap/SAGE wipes as directed  __X__ Use inhalers on the day of surgery  ____ Stop metformin/Janumet/Farxiga 2 days prior to surgery    ____ Take 1/2 of usual insulin dose the night before surgery. No insulin the morning          of surgery.   ____ Stop Blood Thinners Coumadin/Plavix/Xarelto/Pleta/Pradaxa/Eliquis/Effient/Aspirin  on   Or contact your Surgeon, Cardiologist or Medical Doctor regarding  ability to stop your blood thinners  __X__ Stop Anti-inflammatories 7 days before surgery such as Advil, Ibuprofen, Motrin,  BC or Goodies Powder, Naprosyn, Naproxen, Aleve   __X__ Stop all herbal supplements, fish oil or vitamin E until after surgery.    ____ Bring C-Pap to the hospital.

## 2017-09-22 ENCOUNTER — Inpatient Hospital Stay: Payer: Medicare PPO

## 2017-09-22 DIAGNOSIS — Z881 Allergy status to other antibiotic agents status: Secondary | ICD-10-CM

## 2017-09-22 DIAGNOSIS — K219 Gastro-esophageal reflux disease without esophagitis: Secondary | ICD-10-CM | POA: Diagnosis present

## 2017-09-22 DIAGNOSIS — Z23 Encounter for immunization: Secondary | ICD-10-CM

## 2017-09-22 DIAGNOSIS — I739 Peripheral vascular disease, unspecified: Secondary | ICD-10-CM | POA: Diagnosis present

## 2017-09-22 DIAGNOSIS — Z856 Personal history of leukemia: Secondary | ICD-10-CM

## 2017-09-22 DIAGNOSIS — M069 Rheumatoid arthritis, unspecified: Secondary | ICD-10-CM | POA: Diagnosis present

## 2017-09-22 DIAGNOSIS — Z7901 Long term (current) use of anticoagulants: Secondary | ICD-10-CM

## 2017-09-22 DIAGNOSIS — I4891 Unspecified atrial fibrillation: Secondary | ICD-10-CM | POA: Diagnosis present

## 2017-09-22 DIAGNOSIS — Z888 Allergy status to other drugs, medicaments and biological substances status: Secondary | ICD-10-CM

## 2017-09-22 DIAGNOSIS — Z880 Allergy status to penicillin: Secondary | ICD-10-CM

## 2017-09-22 DIAGNOSIS — I714 Abdominal aortic aneurysm, without rupture: Principal | ICD-10-CM | POA: Diagnosis present

## 2017-09-22 DIAGNOSIS — C9110 Chronic lymphocytic leukemia of B-cell type not having achieved remission: Secondary | ICD-10-CM

## 2017-09-22 DIAGNOSIS — Z951 Presence of aortocoronary bypass graft: Secondary | ICD-10-CM

## 2017-09-22 DIAGNOSIS — N400 Enlarged prostate without lower urinary tract symptoms: Secondary | ICD-10-CM | POA: Diagnosis present

## 2017-09-22 DIAGNOSIS — I110 Hypertensive heart disease with heart failure: Secondary | ICD-10-CM | POA: Diagnosis present

## 2017-09-22 DIAGNOSIS — G4733 Obstructive sleep apnea (adult) (pediatric): Secondary | ICD-10-CM | POA: Diagnosis present

## 2017-09-22 DIAGNOSIS — Z86718 Personal history of other venous thrombosis and embolism: Secondary | ICD-10-CM

## 2017-09-22 DIAGNOSIS — F17210 Nicotine dependence, cigarettes, uncomplicated: Secondary | ICD-10-CM | POA: Diagnosis present

## 2017-09-22 DIAGNOSIS — Z7982 Long term (current) use of aspirin: Secondary | ICD-10-CM

## 2017-09-22 DIAGNOSIS — I252 Old myocardial infarction: Secondary | ICD-10-CM

## 2017-09-22 DIAGNOSIS — Z955 Presence of coronary angioplasty implant and graft: Secondary | ICD-10-CM

## 2017-09-22 DIAGNOSIS — Z79899 Other long term (current) drug therapy: Secondary | ICD-10-CM

## 2017-09-22 DIAGNOSIS — M109 Gout, unspecified: Secondary | ICD-10-CM | POA: Diagnosis present

## 2017-09-22 DIAGNOSIS — Z7951 Long term (current) use of inhaled steroids: Secondary | ICD-10-CM

## 2017-09-22 DIAGNOSIS — E785 Hyperlipidemia, unspecified: Secondary | ICD-10-CM | POA: Diagnosis present

## 2017-09-22 DIAGNOSIS — I5022 Chronic systolic (congestive) heart failure: Secondary | ICD-10-CM | POA: Diagnosis present

## 2017-09-22 DIAGNOSIS — I2510 Atherosclerotic heart disease of native coronary artery without angina pectoris: Secondary | ICD-10-CM | POA: Diagnosis present

## 2017-09-22 LAB — COMPREHENSIVE METABOLIC PANEL
ALT: 39 U/L (ref 0–44)
ANION GAP: 9 (ref 5–15)
AST: 39 U/L (ref 15–41)
Albumin: 4.4 g/dL (ref 3.5–5.0)
Alkaline Phosphatase: 89 U/L (ref 38–126)
BUN: 20 mg/dL (ref 8–23)
CO2: 22 mmol/L (ref 22–32)
CREATININE: 0.89 mg/dL (ref 0.61–1.24)
Calcium: 9 mg/dL (ref 8.9–10.3)
Chloride: 106 mmol/L (ref 98–111)
GFR calc Af Amer: >60 mL/min (ref >60)
Glucose, Bld: 142 mg/dL — ABNORMAL HIGH (ref 70–99)
POTASSIUM: 3.8 mmol/L (ref 3.5–5.1)
SODIUM: 137 mmol/L (ref 135–145)
Total Bilirubin: 2.1 mg/dL — ABNORMAL HIGH (ref 0.3–1.2)
Total Protein: 7.5 g/dL (ref 6.5–8.1)

## 2017-09-22 LAB — CBC WITH DIFFERENTIAL/PLATELET
Basophils Absolute: 0 10*3/uL (ref 0–0.1)
Basophils Relative: 1 %
EOS ABS: 0 10*3/uL (ref 0–0.7)
Eosinophils Relative: 0 %
HEMATOCRIT: 44 % (ref 40.0–52.0)
HEMOGLOBIN: 15.2 g/dL (ref 13.0–18.0)
LYMPHS ABS: 0.7 10*3/uL — AB (ref 1.0–3.6)
LYMPHS PCT: 26 %
MCH: 29.7 pg (ref 26.0–34.0)
MCHC: 34.5 g/dL (ref 32.0–36.0)
MCV: 86.2 fL (ref 80.0–100.0)
MONOS PCT: 11 %
Monocytes Absolute: 0.3 10*3/uL (ref 0.2–1.0)
NEUTROS ABS: 1.6 10*3/uL (ref 1.4–6.5)
Neutrophils Relative %: 62 %
Platelets: 136 10*3/uL — ABNORMAL LOW (ref 150–440)
RBC: 5.11 MIL/uL (ref 4.40–5.90)
RDW: 17 % — ABNORMAL HIGH (ref 11.5–14.5)
WBC: 2.6 10*3/uL — AB (ref 3.8–10.6)

## 2017-09-22 LAB — LACTATE DEHYDROGENASE: LDH: 146 U/L (ref 98–192)

## 2017-09-22 LAB — URIC ACID: URIC ACID, SERUM: 3.8 mg/dL (ref 3.7–8.6)

## 2017-09-23 ENCOUNTER — Encounter: Payer: Self-pay | Admitting: Anesthesiology

## 2017-09-23 ENCOUNTER — Other Ambulatory Visit: Payer: Self-pay | Admitting: Oncology

## 2017-09-23 DIAGNOSIS — C9110 Chronic lymphocytic leukemia of B-cell type not having achieved remission: Secondary | ICD-10-CM

## 2017-09-23 MED ORDER — CLINDAMYCIN PHOSPHATE 900 MG/50ML IV SOLN
900.0000 mg | INTRAVENOUS | Status: AC
  Administered 2017-09-24: 900 mg via INTRAVENOUS
  Filled 2017-09-23: qty 50

## 2017-09-24 ENCOUNTER — Inpatient Hospital Stay: Payer: Medicare PPO | Admitting: Anesthesiology

## 2017-09-24 ENCOUNTER — Other Ambulatory Visit: Payer: Self-pay

## 2017-09-24 ENCOUNTER — Encounter
Admission: RE | Disposition: A | Discharge status: Home / Self Care | Payer: Self-pay | Source: Ambulatory Visit | Attending: Vascular Surgery

## 2017-09-24 ENCOUNTER — Encounter: Payer: Self-pay | Admitting: Certified Registered Nurse Anesthetist

## 2017-09-24 ENCOUNTER — Inpatient Hospital Stay
Admission: RE | Admit: 2017-09-24 | Discharge: 2017-09-25 | DRG: 271 | Disposition: A | Discharge status: Home / Self Care | Payer: Medicare PPO | Source: Ambulatory Visit | Attending: Vascular Surgery | Admitting: Vascular Surgery

## 2017-09-24 DIAGNOSIS — Z955 Presence of coronary angioplasty implant and graft: Secondary | ICD-10-CM | POA: Diagnosis not present

## 2017-09-24 DIAGNOSIS — E785 Hyperlipidemia, unspecified: Secondary | ICD-10-CM | POA: Diagnosis present

## 2017-09-24 DIAGNOSIS — Z79899 Other long term (current) drug therapy: Secondary | ICD-10-CM | POA: Diagnosis not present

## 2017-09-24 DIAGNOSIS — I10 Essential (primary) hypertension: Secondary | ICD-10-CM | POA: Diagnosis not present

## 2017-09-24 DIAGNOSIS — Z7901 Long term (current) use of anticoagulants: Secondary | ICD-10-CM | POA: Diagnosis not present

## 2017-09-24 DIAGNOSIS — M069 Rheumatoid arthritis, unspecified: Secondary | ICD-10-CM | POA: Diagnosis present

## 2017-09-24 DIAGNOSIS — Z7982 Long term (current) use of aspirin: Secondary | ICD-10-CM | POA: Diagnosis not present

## 2017-09-24 DIAGNOSIS — I70211 Atherosclerosis of native arteries of extremities with intermittent claudication, right leg: Secondary | ICD-10-CM | POA: Diagnosis not present

## 2017-09-24 DIAGNOSIS — Z7951 Long term (current) use of inhaled steroids: Secondary | ICD-10-CM | POA: Diagnosis not present

## 2017-09-24 DIAGNOSIS — Z856 Personal history of leukemia: Secondary | ICD-10-CM | POA: Diagnosis not present

## 2017-09-24 DIAGNOSIS — I714 Abdominal aortic aneurysm, without rupture, unspecified: Secondary | ICD-10-CM | POA: Diagnosis present

## 2017-09-24 DIAGNOSIS — Z86718 Personal history of other venous thrombosis and embolism: Secondary | ICD-10-CM | POA: Diagnosis not present

## 2017-09-24 DIAGNOSIS — I739 Peripheral vascular disease, unspecified: Secondary | ICD-10-CM | POA: Diagnosis present

## 2017-09-24 DIAGNOSIS — K219 Gastro-esophageal reflux disease without esophagitis: Secondary | ICD-10-CM | POA: Diagnosis present

## 2017-09-24 DIAGNOSIS — Z23 Encounter for immunization: Secondary | ICD-10-CM | POA: Diagnosis present

## 2017-09-24 DIAGNOSIS — Z888 Allergy status to other drugs, medicaments and biological substances status: Secondary | ICD-10-CM | POA: Diagnosis not present

## 2017-09-24 DIAGNOSIS — Z881 Allergy status to other antibiotic agents status: Secondary | ICD-10-CM | POA: Diagnosis not present

## 2017-09-24 DIAGNOSIS — I5022 Chronic systolic (congestive) heart failure: Secondary | ICD-10-CM | POA: Diagnosis present

## 2017-09-24 DIAGNOSIS — Z951 Presence of aortocoronary bypass graft: Secondary | ICD-10-CM | POA: Diagnosis not present

## 2017-09-24 DIAGNOSIS — Z880 Allergy status to penicillin: Secondary | ICD-10-CM | POA: Diagnosis not present

## 2017-09-24 DIAGNOSIS — N400 Enlarged prostate without lower urinary tract symptoms: Secondary | ICD-10-CM | POA: Diagnosis present

## 2017-09-24 DIAGNOSIS — I4891 Unspecified atrial fibrillation: Secondary | ICD-10-CM | POA: Diagnosis not present

## 2017-09-24 DIAGNOSIS — I2510 Atherosclerotic heart disease of native coronary artery without angina pectoris: Secondary | ICD-10-CM | POA: Diagnosis present

## 2017-09-24 DIAGNOSIS — G4733 Obstructive sleep apnea (adult) (pediatric): Secondary | ICD-10-CM | POA: Diagnosis present

## 2017-09-24 DIAGNOSIS — I252 Old myocardial infarction: Secondary | ICD-10-CM | POA: Diagnosis not present

## 2017-09-24 DIAGNOSIS — I110 Hypertensive heart disease with heart failure: Secondary | ICD-10-CM | POA: Diagnosis present

## 2017-09-24 HISTORY — PX: ENDOVASCULAR REPAIR/STENT GRAFT: CATH118280

## 2017-09-24 LAB — PROTIME-INR
INR: 1.12
Prothrombin Time: 14.3 seconds (ref 11.4–15.2)

## 2017-09-24 LAB — ABO/RH: ABO/RH(D): A POS

## 2017-09-24 SURGERY — ENDOVASCULAR REPAIR/STENT GRAFT
Anesthesia: General

## 2017-09-24 MED ORDER — ONDANSETRON HCL 4 MG/2ML IJ SOLN: 4.0000 mg | Freq: Once | INTRAMUSCULAR | Status: DC | PRN

## 2017-09-24 MED ORDER — WARFARIN SODIUM 1 MG PO TABS
1.0000 mg | ORAL_TABLET | Freq: Every day | ORAL | Status: DC
  Filled 2017-09-24: qty 1

## 2017-09-24 MED ORDER — PANTOPRAZOLE SODIUM 40 MG PO TBEC: 40.0000 mg | DELAYED_RELEASE_TABLET | Freq: Every day | ORAL | Status: DC

## 2017-09-24 MED ORDER — FUROSEMIDE 20 MG PO TABS
20.0000 mg | ORAL_TABLET | Freq: Every day | ORAL | Status: DC
  Filled 2017-09-24: qty 1

## 2017-09-24 MED ORDER — ALUM & MAG HYDROXIDE-SIMETH 200-200-20 MG/5ML PO SUSP: 15.0000 mL | ORAL | Status: DC | PRN

## 2017-09-24 MED ORDER — SUGAMMADEX SODIUM 200 MG/2ML IV SOLN
INTRAVENOUS | Status: AC
  Filled 2017-09-24: qty 2

## 2017-09-24 MED ORDER — SUGAMMADEX SODIUM 200 MG/2ML IV SOLN
INTRAVENOUS | Status: DC | PRN
  Administered 2017-09-24: 200 mg via INTRAVENOUS

## 2017-09-24 MED ORDER — FENTANYL CITRATE (PF) 100 MCG/2ML IJ SOLN
INTRAMUSCULAR | Status: AC
  Filled 2017-09-24: qty 2

## 2017-09-24 MED ORDER — ONDANSETRON HCL 4 MG/2ML IJ SOLN: 4.0000 mg | Freq: Four times a day (QID) | INTRAMUSCULAR | Status: DC | PRN

## 2017-09-24 MED ORDER — MAGNESIUM SULFATE 2 GM/50ML IV SOLN: 2.0000 g | Freq: Every day | INTRAVENOUS | Status: DC | PRN

## 2017-09-24 MED ORDER — FLUTICASONE PROPIONATE 50 MCG/ACT NA SUSP
2.0000 | Freq: Every day | NASAL | Status: DC
  Filled 2017-09-24: qty 16

## 2017-09-24 MED ORDER — GABAPENTIN 300 MG PO CAPS
300.0000 mg | ORAL_CAPSULE | Freq: Three times a day (TID) | ORAL | Status: DC
  Administered 2017-09-24: 300 mg via ORAL
  Filled 2017-09-24 (×2): qty 1

## 2017-09-24 MED ORDER — DEXAMETHASONE SODIUM PHOSPHATE 10 MG/ML IJ SOLN
INTRAMUSCULAR | Status: AC
  Filled 2017-09-24: qty 1

## 2017-09-24 MED ORDER — SODIUM CHLORIDE 0.9 % IV SOLN: 500.0000 mL | Freq: Once | INTRAVENOUS | Status: DC | PRN

## 2017-09-24 MED ORDER — ONDANSETRON HCL 4 MG/2ML IJ SOLN
INTRAMUSCULAR | Status: AC
  Filled 2017-09-24: qty 2

## 2017-09-24 MED ORDER — ALBUTEROL SULFATE (2.5 MG/3ML) 0.083% IN NEBU: 2.5000 mg | INHALATION_SOLUTION | Freq: Four times a day (QID) | RESPIRATORY_TRACT | Status: DC | PRN

## 2017-09-24 MED ORDER — METOPROLOL TARTRATE 5 MG/5ML IV SOLN: 2.0000 mg | INTRAVENOUS | Status: DC | PRN

## 2017-09-24 MED ORDER — IPRATROPIUM-ALBUTEROL 0.5-2.5 (3) MG/3ML IN SOLN
3.0000 mL | Freq: Four times a day (QID) | RESPIRATORY_TRACT | Status: DC
  Filled 2017-09-24: qty 3

## 2017-09-24 MED ORDER — FAMOTIDINE IN NACL 20-0.9 MG/50ML-% IV SOLN
20.0000 mg | Freq: Two times a day (BID) | INTRAVENOUS | Status: DC
  Filled 2017-09-24: qty 50

## 2017-09-24 MED ORDER — ONDANSETRON HCL 4 MG/2ML IJ SOLN
INTRAMUSCULAR | Status: DC | PRN
  Administered 2017-09-24: 4 mg via INTRAVENOUS

## 2017-09-24 MED ORDER — GUAIFENESIN-DM 100-10 MG/5ML PO SYRP: 15.0000 mL | ORAL_SOLUTION | ORAL | Status: DC | PRN

## 2017-09-24 MED ORDER — ISOSORBIDE MONONITRATE ER 30 MG PO TB24: 30.0000 mg | ORAL_TABLET | Freq: Every day | ORAL | Status: DC

## 2017-09-24 MED ORDER — GLYCOPYRROLATE 0.2 MG/ML IJ SOLN
INTRAMUSCULAR | Status: AC
  Filled 2017-09-24: qty 1

## 2017-09-24 MED ORDER — EPHEDRINE SULFATE 50 MG/ML IJ SOLN
INTRAMUSCULAR | Status: DC | PRN
  Administered 2017-09-24: 5 mg via INTRAVENOUS
  Administered 2017-09-24 (×2): 10 mg via INTRAVENOUS

## 2017-09-24 MED ORDER — ALLOPURINOL 300 MG PO TABS
300.0000 mg | ORAL_TABLET | Freq: Every day | ORAL | Status: DC
  Filled 2017-09-24: qty 3
  Filled 2017-09-24 (×2): qty 1

## 2017-09-24 MED ORDER — ALBUTEROL SULFATE HFA 108 (90 BASE) MCG/ACT IN AERS: 2.0000 | INHALATION_SPRAY | Freq: Four times a day (QID) | RESPIRATORY_TRACT | Status: DC | PRN

## 2017-09-24 MED ORDER — LOSARTAN POTASSIUM 50 MG PO TABS
100.0000 mg | ORAL_TABLET | Freq: Every day | ORAL | Status: DC
  Filled 2017-09-24: qty 2

## 2017-09-24 MED ORDER — WARFARIN SODIUM 5 MG PO TABS
5.0000 mg | ORAL_TABLET | Freq: Every day | ORAL | Status: DC
  Administered 2017-09-24: 5 mg via ORAL
  Filled 2017-09-24 (×2): qty 1

## 2017-09-24 MED ORDER — VANCOMYCIN HCL IN DEXTROSE 1-5 GM/200ML-% IV SOLN
1000.0000 mg | Freq: Two times a day (BID) | INTRAVENOUS | Status: AC
  Administered 2017-09-24 – 2017-09-25 (×2): 1000 mg via INTRAVENOUS
  Filled 2017-09-24 (×2): qty 200

## 2017-09-24 MED ORDER — PROPOFOL 10 MG/ML IV BOLUS
INTRAVENOUS | Status: AC
  Filled 2017-09-24: qty 20

## 2017-09-24 MED ORDER — ACETAMINOPHEN 650 MG RE SUPP: 325.0000 mg | RECTAL | Status: DC | PRN

## 2017-09-24 MED ORDER — CHLORHEXIDINE GLUCONATE CLOTH 2 % EX PADS
6.0000 | MEDICATED_PAD | Freq: Once | CUTANEOUS | Status: AC
  Administered 2017-09-24: 6 via TOPICAL

## 2017-09-24 MED ORDER — HEPARIN SODIUM (PORCINE) 1000 UNIT/ML IJ SOLN
INTRAMUSCULAR | Status: DC | PRN
  Administered 2017-09-24: 5000 [IU] via INTRAVENOUS

## 2017-09-24 MED ORDER — NITROGLYCERIN IN D5W 200-5 MCG/ML-% IV SOLN
5.0000 ug/min | INTRAVENOUS | Status: DC
  Filled 2017-09-24: qty 250

## 2017-09-24 MED ORDER — POTASSIUM CHLORIDE CRYS ER 20 MEQ PO TBCR
20.0000 meq | EXTENDED_RELEASE_TABLET | Freq: Every day | ORAL | Status: DC
  Administered 2017-09-24: 20 meq via ORAL
  Filled 2017-09-24: qty 1

## 2017-09-24 MED ORDER — IPRATROPIUM-ALBUTEROL 20-100 MCG/ACT IN AERS: 1.0000 | INHALATION_SPRAY | Freq: Four times a day (QID) | RESPIRATORY_TRACT | Status: DC

## 2017-09-24 MED ORDER — METOPROLOL SUCCINATE ER 100 MG PO TB24
100.0000 mg | ORAL_TABLET | Freq: Every day | ORAL | Status: DC
  Filled 2017-09-24: qty 1

## 2017-09-24 MED ORDER — PHENYLEPHRINE HCL 10 MG/ML IJ SOLN
INTRAMUSCULAR | Status: DC | PRN
  Administered 2017-09-24: 200 ug via INTRAVENOUS
  Administered 2017-09-24: 100 ug via INTRAVENOUS
  Administered 2017-09-24: 200 ug via INTRAVENOUS
  Administered 2017-09-24: 100 ug via INTRAVENOUS

## 2017-09-24 MED ORDER — WARFARIN - PHYSICIAN DOSING INPATIENT
Freq: Every day | Status: DC
  Administered 2017-09-24: 18:00:00

## 2017-09-24 MED ORDER — ATORVASTATIN CALCIUM 80 MG PO TABS
80.0000 mg | ORAL_TABLET | Freq: Every day | ORAL | Status: DC
  Filled 2017-09-24: qty 1

## 2017-09-24 MED ORDER — LIDOCAINE HCL (PF) 2 % IJ SOLN
INTRAMUSCULAR | Status: AC
  Filled 2017-09-24: qty 10

## 2017-09-24 MED ORDER — ASPIRIN EC 81 MG PO TBEC
81.0000 mg | DELAYED_RELEASE_TABLET | Freq: Every day | ORAL | Status: DC
  Administered 2017-09-24 – 2017-09-25 (×2): 81 mg via ORAL
  Filled 2017-09-24 (×2): qty 1

## 2017-09-24 MED ORDER — INFLUENZA VAC SPLIT HIGH-DOSE 0.5 ML IM SUSY
0.5000 mL | PREFILLED_SYRINGE | INTRAMUSCULAR | Status: AC
  Administered 2017-09-25: 0.5 mL via INTRAMUSCULAR
  Filled 2017-09-24 (×2): qty 0.5

## 2017-09-24 MED ORDER — HYDRALAZINE HCL 20 MG/ML IJ SOLN: 5.0000 mg | INTRAMUSCULAR | Status: DC | PRN

## 2017-09-24 MED ORDER — ACETAMINOPHEN 325 MG PO TABS
325.0000 mg | ORAL_TABLET | ORAL | Status: DC | PRN
  Administered 2017-09-25: 650 mg via ORAL
  Filled 2017-09-24: qty 2

## 2017-09-24 MED ORDER — DEXAMETHASONE SODIUM PHOSPHATE 10 MG/ML IJ SOLN
INTRAMUSCULAR | Status: DC | PRN
  Administered 2017-09-24: 10 mg via INTRAVENOUS

## 2017-09-24 MED ORDER — POTASSIUM CHLORIDE CRYS ER 20 MEQ PO TBCR: 20.0000 meq | EXTENDED_RELEASE_TABLET | Freq: Every day | ORAL | Status: DC | PRN

## 2017-09-24 MED ORDER — ROCURONIUM BROMIDE 100 MG/10ML IV SOLN
INTRAVENOUS | Status: DC | PRN
  Administered 2017-09-24: 50 mg via INTRAVENOUS

## 2017-09-24 MED ORDER — MONTELUKAST SODIUM 10 MG PO TABS
10.0000 mg | ORAL_TABLET | Freq: Every day | ORAL | Status: DC
  Filled 2017-09-24: qty 1

## 2017-09-24 MED ORDER — ROCURONIUM BROMIDE 50 MG/5ML IV SOLN
INTRAVENOUS | Status: AC
  Filled 2017-09-24: qty 1

## 2017-09-24 MED ORDER — FENTANYL CITRATE (PF) 100 MCG/2ML IJ SOLN: 25.0000 ug | INTRAMUSCULAR | Status: DC | PRN

## 2017-09-24 MED ORDER — OXYCODONE-ACETAMINOPHEN 5-325 MG PO TABS: 1.0000 | ORAL_TABLET | ORAL | Status: DC | PRN

## 2017-09-24 MED ORDER — DOCUSATE SODIUM 100 MG PO CAPS: 100.0000 mg | ORAL_CAPSULE | Freq: Every day | ORAL | Status: DC

## 2017-09-24 MED ORDER — IOPAMIDOL (ISOVUE-300) INJECTION 61%
INTRAVENOUS | Status: DC | PRN
  Administered 2017-09-24: 50 mL via INTRAVENOUS

## 2017-09-24 MED ORDER — SODIUM CHLORIDE 0.9 % IV SOLN
INTRAVENOUS | Status: DC
  Administered 2017-09-24 – 2017-09-25 (×2): via INTRAVENOUS

## 2017-09-24 MED ORDER — VITAMIN B-12 100 MCG PO TABS
100.0000 ug | ORAL_TABLET | Freq: Every day | ORAL | Status: DC
  Administered 2017-09-24: 100 ug via ORAL
  Filled 2017-09-24 (×2): qty 1

## 2017-09-24 MED ORDER — DOPAMINE-DEXTROSE 3.2-5 MG/ML-% IV SOLN
3.0000 ug/kg/min | INTRAVENOUS | Status: DC
  Filled 2017-09-24: qty 250

## 2017-09-24 MED ORDER — MORPHINE SULFATE (PF) 2 MG/ML IV SOLN: 2.0000 mg | INTRAVENOUS | Status: DC | PRN

## 2017-09-24 MED ORDER — PHENOL 1.4 % MT LIQD: 1.0000 | OROMUCOSAL | Status: DC | PRN

## 2017-09-24 MED ORDER — PROPOFOL 10 MG/ML IV BOLUS
INTRAVENOUS | Status: DC | PRN
  Administered 2017-09-24: 150 mg via INTRAVENOUS

## 2017-09-24 MED ORDER — HEPARIN SODIUM (PORCINE) 1000 UNIT/ML IJ SOLN
INTRAMUSCULAR | Status: AC
  Filled 2017-09-24: qty 1

## 2017-09-24 MED ORDER — VENETOCLAX 100 MG PO TABS: 300.0000 mg | ORAL_TABLET | Freq: Every day | ORAL | Status: DC

## 2017-09-24 MED ORDER — CLINDAMYCIN PHOSPHATE 300 MG/50ML IV SOLN
INTRAVENOUS | Status: AC
  Filled 2017-09-24: qty 50

## 2017-09-24 MED ORDER — LABETALOL HCL 5 MG/ML IV SOLN: 10.0000 mg | INTRAVENOUS | Status: DC | PRN

## 2017-09-24 MED ORDER — WARFARIN SODIUM 1 MG PO TABS
1.0000 mg | ORAL_TABLET | ORAL | Status: DC
  Administered 2017-09-24: 1 mg via ORAL
  Filled 2017-09-24: qty 1

## 2017-09-24 MED ORDER — LIDOCAINE HCL (CARDIAC) PF 100 MG/5ML IV SOSY
PREFILLED_SYRINGE | INTRAVENOUS | Status: DC | PRN
  Administered 2017-09-24: 100 mg via INTRAVENOUS

## 2017-09-24 MED ORDER — LACTATED RINGERS IV SOLN
INTRAVENOUS | Status: DC
  Administered 2017-09-24: 09:00:00 via INTRAVENOUS

## 2017-09-24 MED ORDER — FLUTICASONE PROPIONATE 50 MCG/ACT NA SUSP
2.0000 | Freq: Every day | NASAL | Status: DC | PRN
  Filled 2017-09-24: qty 16

## 2017-09-24 MED ORDER — AMLODIPINE BESYLATE 10 MG PO TABS: 10.0000 mg | ORAL_TABLET | Freq: Every day | ORAL | Status: DC

## 2017-09-24 MED ORDER — FENTANYL CITRATE (PF) 100 MCG/2ML IJ SOLN
INTRAMUSCULAR | Status: DC | PRN
  Administered 2017-09-24: 100 ug via INTRAVENOUS

## 2017-09-24 MED ORDER — HYDROXYCHLOROQUINE SULFATE 200 MG PO TABS
200.0000 mg | ORAL_TABLET | Freq: Every day | ORAL | Status: DC
  Administered 2017-09-24: 200 mg via ORAL
  Filled 2017-09-24 (×2): qty 1

## 2017-09-24 SURGICAL SUPPLY — 23 items
CANNULA 5F STIFF (CANNULA) ×2 IMPLANT
CATH ACCU-VU SIZ PIG 5F 70CM (CATHETERS) ×2 IMPLANT
CATH BALLN CODA 9X100X32 (BALLOONS) ×2 IMPLANT
DERMABOND ADVANCED (GAUZE/BANDAGES/DRESSINGS) ×1
DERMABOND ADVANCED .7 DNX12 (GAUZE/BANDAGES/DRESSINGS) ×1 IMPLANT
DEVICE CLOSURE PERCLS PRGLD 6F (VASCULAR PRODUCTS) ×3 IMPLANT
DEVICE PRESTO INFLATION (MISCELLANEOUS) ×2 IMPLANT
DEVICE SAFEGUARD 24CM (GAUZE/BANDAGES/DRESSINGS) ×2 IMPLANT
DEVICE TORQUE .025-.038 (MISCELLANEOUS) ×2 IMPLANT
DRYSEAL FLEXSHEATH 16FR 33CM (SHEATH) ×1
GLIDEWIRE STIFF .35X180X3 HYDR (WIRE) ×2 IMPLANT
LEG CONTRALATERAL 27X12 (Vascular Products) ×2 IMPLANT
PACK ANGIOGRAPHY (CUSTOM PROCEDURE TRAY) ×2 IMPLANT
PERCLOSE PROGLIDE 6F (VASCULAR PRODUCTS) ×6
SHEATH BRITE TIP 5FRX11 (SHEATH) ×2 IMPLANT
SHEATH BRITE TIP 6FRX11 (SHEATH) ×2 IMPLANT
SHEATH BRITE TIP 8FRX11 (SHEATH) ×2 IMPLANT
SHEATH DRYSEAL FLEX 16FR 33CM (SHEATH) ×1 IMPLANT
SHIELD RADPAD SCOOP 12X17 (MISCELLANEOUS) ×4 IMPLANT
SUT MNCRL AB 4-0 PS2 18 (SUTURE) ×2 IMPLANT
TUBING CONTRAST HIGH PRESS 72 (TUBING) ×2 IMPLANT
WIRE AMPLATZ SSTIFF .035X260CM (WIRE) ×2 IMPLANT
WIRE J 3MM .035X145CM (WIRE) ×4 IMPLANT

## 2017-09-24 NOTE — Anesthesia Post-op Follow-up Note (Signed)
Anesthesia QCDR form completed.        

## 2017-09-24 NOTE — Anesthesia Preprocedure Evaluation (Signed)
Anesthesia Evaluation  Patient identified by MRN, date of birth, ID band Patient awake    Reviewed: Allergy & Precautions, NPO status , Patient's Chart, lab work & pertinent test results, reviewed documented beta blocker date and time   Airway Mallampati: III  TM Distance: >3 FB     Dental  (+) Chipped   Pulmonary sleep apnea , Current Smoker,           Cardiovascular hypertension, Pt. on medications and Pt. on home beta blockers + CAD, + Past MI, + Cardiac Stents, + CABG, + Peripheral Vascular Disease and +CHF  + dysrhythmias Atrial Fibrillation      Neuro/Psych    GI/Hepatic GERD  Controlled,  Endo/Other  diabetes, Type 2  Renal/GU      Musculoskeletal  (+) Arthritis , Rheumatoid disorders,    Abdominal   Peds  Hematology   Anesthesia Other Findings Smokes.gout.CLL. Hx of PVCs.  Reproductive/Obstetrics                             Anesthesia Physical Anesthesia Plan  ASA: III  Anesthesia Plan: General   Post-op Pain Management:    Induction: Intravenous  PONV Risk Score and Plan:   Airway Management Planned: Oral ETT  Additional Equipment:   Intra-op Plan:   Post-operative Plan:   Informed Consent: I have reviewed the patients History and Physical, chart, labs and discussed the procedure including the risks, benefits and alternatives for the proposed anesthesia with the patient or authorized representative who has indicated his/her understanding and acceptance.     Plan Discussed with: CRNA  Anesthesia Plan Comments:         Anesthesia Quick Evaluation

## 2017-09-24 NOTE — Anesthesia Postprocedure Evaluation (Signed)
Anesthesia Post Note  Patient: Martin Adkins  Procedure(s) Performed: ENDOVASCULAR REPAIR/STENT GRAFT (N/A )  Patient location during evaluation: Cath Lab Anesthesia Type: General Level of consciousness: awake and alert Pain management: pain level controlled Vital Signs Assessment: post-procedure vital signs reviewed and stable Respiratory status: spontaneous breathing, nonlabored ventilation, respiratory function stable and patient connected to nasal cannula oxygen Cardiovascular status: blood pressure returned to baseline and stable Postop Assessment: no apparent nausea or vomiting Anesthetic complications: no     Last Vitals:  Vitals:   09/24/17 1214 09/24/17 1229  BP: 113/75 123/74  Pulse: 66 63  Resp: (!) 24 (!) 21  Temp:    SpO2: 95% 98%    Last Pain:  Vitals:   09/24/17 1159  TempSrc:   PainSc: 0-No pain                 Jeret Goyer S

## 2017-09-24 NOTE — Progress Notes (Signed)
 Vein and Vascular Surgery  Daily Progress Note   Subjective  - Day of Surgery  Feeling well.  Says this was much better than last time.  Very little Adkins.  Would like to get his Foley catheter out and have real food  Objective Vitals:   09/24/17 1214 09/24/17 1229 09/24/17 1244 09/24/17 1314  BP: 113/75 123/74 125/75 120/75  Pulse: 66 63 62 64  Resp: (!) 24 (!) 21 20 18   Temp:   (!) 97.5 F (36.4 C) 97.8 F (36.6 C)  TempSrc:    Oral  SpO2: 95% 98% 98% 96%  Weight:      Height:        Intake/Output Summary (Last 24 hours) at 09/24/2017 1453 Last data filed at 09/24/2017 1244 Gross per 24 hour  Intake 737 ml  Output 25 ml  Net 712 ml    PULM  CTAB CV  RRR VASC  Access site C/D/I  Laboratory CBC    Component Value Date/Time   WBC 2.6 (L) 09/22/2017 0843   HGB 15.2 09/22/2017 0843   HGB 13.7 02/13/2014 1254   HCT 44.0 09/22/2017 0843   HCT 42.5 02/13/2014 1254   PLT 136 (L) 09/22/2017 0843   PLT 228 02/13/2014 1254    BMET    Component Value Date/Time   NA 137 09/22/2017 0843   NA 138 02/13/2014 1254   K 3.8 09/22/2017 0843   K 4.1 02/13/2014 1254   CL 106 09/22/2017 0843   CL 106 02/13/2014 1254   CO2 22 09/22/2017 0843   CO2 26 02/13/2014 1254   GLUCOSE 142 (H) 09/22/2017 0843   GLUCOSE 98 02/13/2014 1254   BUN 20 09/22/2017 0843   BUN 13 02/13/2014 1254   CREATININE 0.89 09/22/2017 0843   CREATININE 0.83 02/13/2014 1254   CALCIUM 9.0 09/22/2017 0843   CALCIUM 8.4 (L) 02/13/2014 1254   GFRNONAA >60 09/22/2017 0843   GFRNONAA >60 02/13/2014 1254   GFRNONAA >60 05/03/2013 0527   GFRAA >60 09/22/2017 0843   GFRAA >60 02/13/2014 1254   GFRAA >60 05/03/2013 0527    Assessment/Planning: POD #0 s/p iliac extension limb to repair type Ib endoleak of abdominal aortic aneurysm.   Doing well  Can advance diet  Remove Foley  Take PAD off in the morning and increase activity tomorrow  Likely discharge home tomorrow    Martin Adkins  09/24/2017, 2:53 PM

## 2017-09-24 NOTE — Transfer of Care (Signed)
Immediate Anesthesia Transfer of Care Note  Patient: Martin Adkins  Procedure(s) Performed: ENDOVASCULAR REPAIR/STENT GRAFT (N/A )  Patient Location: PACU  Anesthesia Type:General  Level of Consciousness: awake and alert   Airway & Oxygen Therapy: Patient Spontanous Breathing and Patient connected to face mask oxygen  Post-op Assessment: Report given to RN and Post -op Vital signs reviewed and stable  Post vital signs: Reviewed and stable  Last Vitals:  Vitals Value Taken Time  BP 122/87 09/24/2017 11:29 AM  Temp 36.3 C 09/24/2017 11:29 AM  Pulse 69 09/24/2017 11:34 AM  Resp 18 09/24/2017 11:34 AM  SpO2 100 % 09/24/2017 11:34 AM  Vitals shown include unvalidated device data.  Last Pain:  Vitals:   09/24/17 1129  TempSrc:   PainSc: 0-No pain         Complications: No apparent anesthesia complications

## 2017-09-24 NOTE — Anesthesia Procedure Notes (Addendum)
Procedure Name: Intubation Date/Time: 09/24/2017 10:25 AM Performed by: Womer, Rex Kras, RN Pre-anesthesia Checklist: Patient identified, Emergency Drugs available, Suction available, Patient being monitored and Timeout performed Patient Re-evaluated:Patient Re-evaluated prior to induction Oxygen Delivery Method: Circle system utilized Preoxygenation: Pre-oxygenation with 100% oxygen Induction Type: IV induction Ventilation: Mask ventilation without difficulty Laryngoscope Size: Mac and 4 Grade View: Grade I Tube type: Oral Tube size: 7.5 mm Number of attempts: 1 Airway Equipment and Method: Stylet Placement Confirmation: ETT inserted through vocal cords under direct vision,  positive ETCO2 and breath sounds checked- equal and bilateral Secured at: 22 cm Tube secured with: Tape Dental Injury: Teeth and Oropharynx as per pre-operative assessment

## 2017-09-24 NOTE — Op Note (Signed)
[facility]   OPERATIVE NOTE  DATE: 09/24/2017  PRE-OPERATIVE DIAGNOSIS: Enlarging abdominal aortic aneurysm status post endovascular repair years ago with a type Ib endoleak  POST-OPERATIVE DIAGNOSIS: same as above  PROCEDURE: 1.   Ultrasound guidance for vascular access right femoral artery 2.   Catheter placement into the aorta from right femoral approach 3.   Placement of a right iliac extension limb from the previous stent down to the distal common iliac artery using a 27 mm diameter by 12 cm length right iliac limb 4.   Pro-glide closure device right femoral artery  SURGEON: Leotis Pain  ASSISTANT(S): Dr. Hortencia Pilar, MD  ANESTHESIA: General  ESTIMATED BLOOD LOSS: 10 cc  FINDING(S): 1.  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   INDICATIONS:   Martin Adkins is a 72 y.o. male who presents with an enlarging abdominal aortic aneurysm several years status post endovascular repair.  The CT scan showed a clear type Ib endoleak on the right.  Risks and benefits were discussed and informed consent was obtained.  Assistant is used to help facilitate placement of the very large sheath and gain hemostasis while closure devices were being deployed as well as to contribute to interpretation and performance of the procedure in general.  DESCRIPTION: After obtaining full informed written consent, the patient was brought back to the operating room and placed supine upon the operating table.  The patient was prepped and draped in the standard fashion.  Ultrasound was used to access the right femoral artery without difficulty and a permanent image was recorded.  A 6 French sheath was placed and then to pro-glide closure devices were placed in the right femoral artery.  The assistant was important to keeping hemostasis with the placement of these devices.  We then upsized to an 8 [place] sheath  and gave 5000 units of heparin.  A pigtail catheter and J-wire were used to advance to the right iliac artery and successfully cannulate the right iliac limb without difficulty.  It was then advanced into the main body and twirled and then placed up into the aorta just proximal to the stent graft.  Aortogram was performed showing no proximal endoleak, no left iliac endoleak, and the clear right iliac endoleak.  Magnified image was then performed with a retrograde angiogram through the right femoral sheath to opacify the right iliac system better.  There was a good landing zone over the last 3 to 4 cm of the right common iliac artery where the aneurysmal degeneration had decreased.  We then upsized to a 16 French sheath on the right over an Amplatz superstiff wire.  A 27 mm diameter bell bottom limb which was 12 cm in length was then placed through the right sheath.  There were several centimeters of overlap into the previous stent graft and it was deployed just above the hypogastric artery on the right.  The compliant balloon was used and a good seal was obtained.  Completion imaging both from an aortogram and the retrograde injection showed no residual type I or III endoleak.  There may have been a small type II endoleak, but this was very minimal.  There was an excellent seal in the right iliac limb.  The sheath was removed and the pro-glide closure device was secured down in the right femoral artery with a third pro-glide device being required for hemostasis.  Hemostasis was achieved.  The  assistant was necessary to keep hemostasis during the placement of the pro-glide devices and the removal of the sheath.  A 4-0 Monocryl suture was placed in the skin and a sterile dressing was placed. At this point, we elected to complete the procedure.  The patient was taken to the recovery room in stable condition.   COMPLICATIONS: None  CONDITION: Stable  Leotis Pain  09/24/2017, 11:32 AM    This note was created  with Dragon Medical transcription system. Any errors in dictation are purely unintentional.

## 2017-09-24 NOTE — H&P (Signed)
[facility] Admission History & Physical  MRN : [id number]  Martin Adkins is a 72 y.o. (October 29, 1945) male who presents with chief complaint of No chief complaint on file. Marland Kitchen  History of Present Illness: Patient presents for enlarging abdominal aorta he is about 8 years status post endovascular repair of his abdominal aortic aneurysm.  Over the first 5 years or so he really had no problems.  Over the past year or 2 he has had enlargement of his abdominal aortic aneurysm sac on duplex follow-up.  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  It appears if the proximal portion and the left iliac limb have good seal. He has no complaints today.  He does have some claudication symptoms.  Current Facility-Administered Medications  Medication Dose Route Frequency Provider Last Rate Last Dose  . clindamycin (CLEOCIN) 300 MG/50ML IVPB           . clindamycin (CLEOCIN) IVPB 900 mg  900 mg Intravenous On Call to OR Kris Hartmann, NP      . lactated ringers infusion   Intravenous Continuous Martha Clan, MD 75 mL/hr at 09/24/17 [phone]    . ondansetron (ZOFRAN) injection 4 mg  4 mg Intravenous Q6H PRN Kris Hartmann, NP               Current Outpatient Medications on File Prior to Visit  Medication Sig Dispense Refill  . albuterol (PROVENTIL HFA;VENTOLIN HFA) 108 (90 Base) MCG/ACT inhaler Inhale 2 puffs into the lungs every 6 (six) hours as needed for wheezing or shortness of breath. 1 Inhaler 0  . allopurinol (ZYLOPRIM) 300 MG tablet Take 1 tablet (300 mg total) by mouth daily. 30 tablet 0  . amLODipine (NORVASC) 10 MG tablet Take 10 mg by mouth daily.     Marland Kitchen aspirin EC 81 MG tablet Take 81 mg by mouth daily.    Marland Kitchen atorvastatin (LIPITOR) 80 MG tablet TAKE 1 TABLET (80 MG TOTAL) BY MOUTH ONCE DAILY.  11  . docusate  sodium (COLACE) 100 MG capsule Take 1 capsule (100 mg total) by mouth 2 (two) times daily. 60 capsule 0  . fluticasone (FLONASE) 50 MCG/ACT nasal spray Place 2 sprays into both nostrils daily.     . furosemide (LASIX) 20 MG tablet Take 20 mg by mouth daily.  11  . gabapentin (NEURONTIN) 300 MG capsule Take 1 capsule (300 mg total) by mouth 3 (three) times daily. 90 capsule 0  . hydroxychloroquine (PLAQUENIL) 200 MG tablet Take 1 tablet by mouth daily.    . Ipratropium-Albuterol (COMBIVENT) 20-100 MCG/ACT AERS respimat Inhale 1 puff into the lungs every 6 (six) hours. 1 Inhaler 11  . isosorbide mononitrate (IMDUR) 30 MG 24 hr tablet Take 30 mg by mouth daily.     Marland Kitchen losartan (COZAAR) 100 MG tablet 100 mg daily.     Phillis Knack EYE DROPS 0.4-0.3 % SOLN Apply 1 drop to eye daily as needed.     . metoprolol succinate (TOPROL-XL) 100 MG 24 hr tablet Take 100 mg by mouth daily.     . montelukast (SINGULAIR) 10 MG tablet Take 1 tablet (10 mg total) by mouth as needed. Take before Gazyva infusion. 20 tablet 0  . pantoprazole (PROTONIX) 40 MG tablet Take 40 mg by mouth daily.     . potassium chloride  SA (K-DUR,KLOR-CON) 20 MEQ tablet Take 20 mEq by mouth daily.     . Venetoclax 10 & 50 & 100 MG TBPK Take 20mg  by mouth daily for week one, 50mg  daily for week 2, 100mg  daily for week 3, 200mg  daily for week 4. 42 each 0  . vitamin B-12 (CYANOCOBALAMIN) 100 MCG tablet Take 1 tablet (100 mcg total) by mouth daily. 90 tablet 1  . warfarin (COUMADIN) 1 MG tablet Take by mouth daily. Patient takes once a week with 5mg  tablet for a total of 6 mg per day    . warfarin (COUMADIN) 5 MG tablet TAKE 1 TABLET (5 MG TOTAL) BY MOUTH ONCE DAILY.  5     Past Medical History:  Diagnosis Date  . AAA (abdominal aortic aneurysm) without rupture ([place]) 04/05/2014  . AAA (abdominal aortic aneurysm) without rupture ([place]) 04/05/2014  . Abdominal aortic aneurysm without rupture ([place])   . Acute non-ST elevation  myocardial infarction (NSTEMI) ([place]) 08/15/2014  . 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/28/2014  . APS (antiphospholipid syndrome) ([place])   . Arteriosclerosis of coronary artery 04/05/2014   Overview:  Sp cabg with lima to lad svg to d1 om1 rca 2004 with occluded svg to rca and d1 and pci stetn of om1 graft 2015   . Arthritis   . B12 deficiency 03/21/2017  . Barrett's esophagus   . Benign essential HTN 05/02/2014  . BPH (benign prostatic hyperplasia)   . CAD (coronary artery disease)   . CHF (congestive heart failure) ([place])   . Chronic systolic heart failure ([place]) 04/19/2014   Overview:  With segmental LV systolic dysfunction ejection fraction of 35%   . CLL (chronic lymphocytic leukemia) ([place])   . CLL (chronic lymphocytic leukemia) ([place]) 05/24/2017  . DVT (deep venous thrombosis) ([place])   . Dysrhythmia    ATRIAL FIB.  Marland Kitchen GERD (gastroesophageal reflux disease)   . History of hiatal hernia   . History of shingles 2013  . Hyperlipemia   . Hyperplastic polyps of stomach   . Hypertension   . Myocardial infarction ([place]) 07/30/2014  . NSTEMI (non-ST elevated myocardial infarction) ([place])   . OSA (obstructive sleep apnea)   . Osteoarthritis   . Pre-diabetes   . PVD (peripheral vascular disease) ([place])   . RA (rheumatoid arthritis) ([place])   . SOB (shortness of breath)     Past Surgical History:  Procedure Laterality Date  . ABDOMINAL AORTA STENT    . CHOLECYSTECTOMY    . COLONOSCOPY  11/24/2009  . CORONARY ANGIOPLASTY WITH STENT PLACEMENT  2015  . CORONARY ARTERY BYPASS GRAFT    . ESOPHAGOGASTRODUODENOSCOPY  05/26/02  03/23/12  . ESOPHAGOGASTRODUODENOSCOPY (EGD) WITH PROPOFOL N/A 10/28/2016   Procedure: ESOPHAGOGASTRODUODENOSCOPY (EGD) WITH PROPOFOL;  Surgeon: Manya Silvas, MD;  Location[facility] ENDOSCOPY;  Service: Endoscopy;  Laterality: N/A;  . PERIPHERAL VASCULAR CATHETERIZATION N/A 09/06/2014   Procedure: IVC Filter Removal;  Surgeon: Katha Cabal, MD;  Location: [facility] LAB;   Service: Cardiovascular;  Laterality: N/A;  . TRANSURETHRAL RESECTION OF PROSTATE N/A 02/20/2015   Procedure: TRANSURETHRAL RESECTION OF THE PROSTATE (TURP);  Surgeon: Hollice Espy, MD;  Location: [facility] ORS;  Service: Urology;  Laterality: N/A;    Social History Social History   Tobacco Use  . Smoking status: Current Some Day Smoker    Packs/day: 0.25    Years: 20.00    Pack years: 5.00    Types: Cigarettes  . Smokeless tobacco: Never Used  . Tobacco comment: per pt  nicotine patch number7mg  -smokes 1-3 cig/per d now  Substance Use Topics  . Alcohol use: Yes    Alcohol/week: 0.0 standard drinks    Comment: social on weekends  . Drug use: No    Family History Family History  Problem Relation Age of Onset  . Cancer Mother 23       breast ca  . Diabetes Mother   . Coronary artery disease Father   . Heart attack Father   . Prostate cancer Brother   . Bladder Cancer Neg Hx   . Kidney cancer Neg Hx     Allergies  Allergen Reactions  . Ace Inhibitors Other (See Comments)    Other reaction(s): Cough  . Pravastatin Other (See Comments)    Other reaction(s): Muscle Pain  . Rosuvastatin Other (See Comments)    Other reaction(s): Other (See Comments) GI bleed  . Simvastatin Other (See Comments)    Other reaction(s): Muscle Pain  . Amoxicillin Rash  . Niacin Rash  . Penicillins Rash    Has patient had a PCN reaction causing immediate rash, facial/tongue/throat swelling, SOB or lightheadedness with hypotension: Yes Has patient had a PCN reaction causing severe rash involving mucus membranes or skin necrosis: Unknown Has patient had a PCN reaction that required hospitalization: Unknown Has patient had a PCN reaction occurring within the last 10 years: No If all of the above answers are "NO", then may proceed with Cephalosporin use.     REVIEW OF SYSTEMS (Negative unless checked)  Constitutional: [] Weight loss  [] Fever  [] Chills Cardiac: [] Chest pain   [] Chest pressure    [] Palpitations   [] Shortness of breath when laying flat   [] Shortness of breath at rest   [] Shortness of breath with exertion. Vascular:  [x] Pain in legs with walking   [] Pain in legs at rest   [] Pain in legs when laying flat   [] Claudication   [] Pain in feet when walking  [] Pain in feet at rest  [] Pain in feet when laying flat   [] History of DVT   [] Phlebitis   [] Swelling in legs   [] Varicose veins   [] Non-healing ulcers Pulmonary:   [] Uses home oxygen   [] Productive cough   [] Hemoptysis   [] Wheeze  [] COPD   [] Asthma Neurologic:  [] Dizziness  [] Blackouts   [] Seizures   [] History of stroke   [] History of TIA  [] Aphasia   [] Temporary blindness   [] Dysphagia   [] Weakness or numbness in arms   [] Weakness or numbness in legs Musculoskeletal:  [x] Arthritis   [] Joint swelling   [] Joint pain   [] Low back pain Hematologic:  [] Easy bruising  [] Easy bleeding   [x] Hypercoagulable state   [] Anemic  [] Hepatitis Gastrointestinal:  [] Blood in stool   [] Vomiting blood  [] Gastroesophageal reflux/heartburn   [] Difficulty swallowing. Genitourinary:  [] Chronic kidney disease   [] Difficult urination  [] Frequent urination  [] Burning with urination   [] Blood in urine Skin:  [] Rashes   [] Ulcers   [] Wounds Psychological:  [] History of anxiety   []  History of major depression.  Physical Examination  Vitals:   09/24/17 0904  BP: (!) 127/93  Pulse: 64  Resp: 20  Temp: (!) 97 F (36.1 C)  TempSrc: Oral  SpO2: 98%  Weight: 95.3 kg  Height: 6' (1.829 m)   Body mass index is 28.49 kg/m. Gen: WD/WN, NAD Head: /AT, No temporalis wasting.  Ear/Nose/Throat: Hearing grossly intact, nares w/o erythema or drainage, oropharynx w/o Erythema/Exudate,  Eyes: Conjunctiva clear, sclera non-icteric Neck: Trachea midline.  No JVD.  Pulmonary:  Good air movement, respirations not labored, no use of accessory muscles.  Cardiac: irregular Vascular:  Vessel Right Left  Radial Palpable Palpable                          PT  1+ Palpable 1+ Palpable  DP 1+ Palpable Palpable   Gastrointestinal: soft, non-tender/non-distended. Increased aortic impulse Musculoskeletal: M/S 5/5 throughout.  Extremities without ischemic changes.  No deformity or atrophy.  Neurologic: Sensation grossly intact in extremities.  Symmetrical.  Speech is fluent. Motor exam as listed above. Psychiatric: Judgment intact, Mood & affect appropriate for pt's clinical situation. Dermatologic: No rashes or ulcers noted.  No cellulitis or open wounds.      CBC Lab Results  Component Value Date   WBC 2.6 (L) 09/22/2017   HGB 15.2 09/22/2017   HCT 44.0 09/22/2017   MCV 86.2 09/22/2017   PLT 136 (L) 09/22/2017    BMET    Component Value Date/Time   NA 137 09/22/2017 0843   NA 138 02/13/2014 1254   K 3.8 09/22/2017 0843   K 4.1 02/13/2014 1254   CL 106 09/22/2017 0843   CL 106 02/13/2014 1254   CO2 22 09/22/2017 0843   CO2 26 02/13/2014 1254   GLUCOSE 142 (H) 09/22/2017 0843   GLUCOSE 98 02/13/2014 1254   BUN 20 09/22/2017 0843   BUN 13 02/13/2014 1254   CREATININE 0.89 09/22/2017 0843   CREATININE 0.83 02/13/2014 1254   CALCIUM 9.0 09/22/2017 0843   CALCIUM 8.4 (L) 02/13/2014 1254   GFRNONAA >60 09/22/2017 0843   GFRNONAA >60 02/13/2014 1254   GFRNONAA >60 05/03/2013 0527   GFRAA >60 09/22/2017 0843   GFRAA >60 02/13/2014 1254   GFRAA >60 05/03/2013 0527   Estimated Creatinine Clearance: 93.8 mL/min (by C-G formula based on SCr of 0.89 mg/dL).  COAG Lab Results  Component Value Date   INR 1.12 09/24/2017   INR 2.38 09/17/2017   INR 2.24 09/16/2017    Radiology Ct Angio Abd/pel W/ And/or W/o  Result Date: 09/01/2017 CLINICAL DATA:  Evaluate endovascular repair of the abdominal aortic aneurysm. EXAM: CT ANGIOGRAPHY ABDOMEN AND PELVIS WITH CONTRAST AND WITHOUT CONTRAST TECHNIQUE: Multidetector CT imaging of the abdomen and pelvis was performed using the standard protocol during bolus administration of intravenous  contrast. Multiplanar reconstructed images and MIPs were obtained and reviewed to evaluate the vascular anatomy. CONTRAST:  155mL ISOVUE-370 IOPAMIDOL (ISOVUE-370) INJECTION 76% COMPARISON:  PET-CT 04/11/2017 and CTA 07/09/2011 FINDINGS: VASCULAR Aorta: Endovascular repair of an abdominal aortic aneurysm. The proximal aspect of the stent graft is in the infrarenal abdominal aorta and located just below the right renal artery. The left limb of the graft extends to the distal left common iliac artery. The right limb terminates in the distal aorta near the bifurcation. The stent does not extend into the right common iliac artery and is not sealing or excluding the aneurysm. This is compatible with a type 1 endoleak, 1B. The aneurysm sac measures up to 5.1 cm on sequence 8, image 120. Aneurysm sac measured 5.0 cm on 04/21/2017. Celiac: Patent without evidence of aneurysm, dissection, vasculitis or significant stenosis. SMA: Patent without evidence of aneurysm, dissection, vasculitis or significant stenosis. Renals: Main bilateral renal arteries are patent. Mild atherosclerotic disease in the renal arteries without significant stenosis. Mild ectasia at the origin of the left renal artery. IMA: Retrograde filling of the inferior mesenteric artery. 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 Right common iliac artery measured roughly 2.3 cm on 07/09/2011. Mild narrowing at the origin of the right internal iliac artery. Evidence for a thrombosed aneurysm sac at the right iliac artery bifurcation. This thrombosed aneurysm sac measures roughly 2.2 cm, previously measured roughly 1.5 cm in 2013. Ectasia of the right common iliac artery and suspect postoperative changes in this area. The distal left common iliac artery beyond the stent measures 1.9 cm and minimally changed. Ectasia and small aneurysm formations involving the left internal iliac artery are again  noted. Left external iliac artery is patent without significant atherosclerotic disease. Proximal Outflow: Proximal femoral arteries are patent bilaterally. Veins: IVC and renal veins are patent. Visualized portal venous system is patent. Review of the MIP images confirms the above findings. NON-VASCULAR Lower chest: Few linear and bandlike densities at the lung bases are suggestive for atelectasis or mild scarring. No large effusions. Prior median sternotomy. Hepatobiliary: Cholecystectomy.  No acute abnormality to the liver. Pancreas: Unremarkable. No pancreatic ductal dilatation or surrounding inflammatory changes. Spleen: Decreased fullness of the spleen compared to the prior PET-CT. No focal abnormality to the spleen. Spleen measures 9.5 x 7.6 cm on sequence 8, and image 48 and previously measured 13.4 x 9.8 cm on the recent PET-CT. Adrenals/Urinary Tract: Chronic thickening involving the bilateral adrenal glands that may represent hyperplasia. Chronic scarring along the left kidney lower pole. Evidence for central cyst in the left kidney. No hydronephrosis. Urinary bladder is unremarkable. Small cyst in the mid/upper pole of the right kidney. Stomach/Bowel: Diverticulosis in the sigmoid colon without acute bowel inflammation. Negative for bowel obstruction. Normal appendix. Lymphatic: No lymph node enlargement in the abdomen or pelvis. Reproductive: Prostate is unremarkable. Other: Right inguinal hernia containing fat. Evidence for previous left inguinal hernia repair. No free fluid. Negative for free air Musculoskeletal: No acute abnormality. IMPRESSION: VASCULAR Type 1b endoleak. Patient has an infrarenal abdominal aortic aneurysm stent graft but the right side of the aortic bifurcation and the right common iliac artery aneurysm are not excluded by the stents. The right limb stent graft does not extend into the right common iliac artery aneurysm. The abdominal aortic aneurysm measures up to 5.1 cm. Aortic  aneurysm sac size has not significantly changed since the PET-CT on 04/21/2017. 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 Evidence for a thrombosed or partially thrombosed aneurysm sac at the right iliac artery bifurcation measuring up to 2.2 cm and enlarged since 2013. NON-VASCULAR Spleen has decreased in size since the PET-CT on 04/21/2017. Left renal scarring.  Renal cysts. Electronically Signed   By: Markus Daft M.D.   On: 09/01/2017 10:06      Assessment/Plan 1.  Abdominal aortic aneurysm.  Type Ib endoleak with no seal in the right iliac artery which has had aneurysmal degeneration.  We are going to repair this today.  Have discussed with the patient this would likely require coil embolization of the right hypogastric artery and extension to the right external iliac artery.  This may worsen his claudication symptoms but the buttock claudication should improve over time. 2.  Peripheral arterial disease with claudication.  ABIs suggest a component of peripheral vascular disease.  However, we will need to address his aneurysmal symptoms first. 3.  Hypertension.  Stable on outpatient medications and blood pressure control important in reducing the progression of atherosclerotic disease. On appropriate oral medications. 4.  A. Fib.  On chronic anticoagulation and rate controlled.   Leotis Pain, MD  09/24/2017 10:11 AM

## 2017-09-24 NOTE — Progress Notes (Signed)
Patient request breathing treatments be discontinued. Per direct conversation with patient, protocol assessment was conducted. Patient scored a level 1, per protocol Duoneb treatments Discontinued. Patient still has Albuterol Q6 PRN ordered.

## 2017-09-25 ENCOUNTER — Encounter: Payer: Self-pay | Admitting: Vascular Surgery

## 2017-09-25 LAB — BASIC METABOLIC PANEL
ANION GAP: 6 (ref 5–15)
BUN: 12 mg/dL (ref 8–23)
CHLORIDE: 107 mmol/L (ref 98–111)
CO2: 26 mmol/L (ref 22–32)
Calcium: 8.9 mg/dL (ref 8.9–10.3)
Creatinine, Ser: 0.71 mg/dL (ref 0.61–1.24)
GFR calc non Af Amer: >60 mL/min (ref >60)
Glucose, Bld: 114 mg/dL — ABNORMAL HIGH (ref 70–99)
POTASSIUM: 4.4 mmol/L (ref 3.5–5.1)
Sodium: 139 mmol/L (ref 135–145)

## 2017-09-25 LAB — PROTIME-INR
INR: 1.09
Prothrombin Time: 14 seconds (ref 11.4–15.2)

## 2017-09-25 LAB — CBC
HEMATOCRIT: 40.5 % (ref 40.0–52.0)
HEMOGLOBIN: 13.9 g/dL (ref 13.0–18.0)
MCH: 30.1 pg (ref 26.0–34.0)
MCHC: 34.3 g/dL (ref 32.0–36.0)
MCV: 87.7 fL (ref 80.0–100.0)
PLATELETS: 127 10*3/uL — AB (ref 150–440)
RBC: 4.62 MIL/uL (ref 4.40–5.90)
RDW: 17.1 % — ABNORMAL HIGH (ref 11.5–14.5)
WBC: 3.7 10*3/uL — ABNORMAL LOW (ref 3.8–10.6)

## 2017-09-25 LAB — MAGNESIUM: Magnesium: 2 mg/dL (ref 1.7–2.4)

## 2017-09-25 MED ORDER — INFLUENZA VAC SPLIT HIGH-DOSE 0.5 ML IM SUSY: 0.5000 mL | PREFILLED_SYRINGE | INTRAMUSCULAR | 0 refills | Status: AC

## 2017-09-25 NOTE — Discharge Instructions (Signed)
Please resume your normal Coumadin dosing.  Please have you INR checked by Friday or Saturday. You may shower as of Friday. Keep your groins clean and dry. Please do not bathe or submerge in water until cleared by your doctor. No heavy lifting, greater than 10 pounds for at least a month. No driving while on pain medication.

## 2017-09-25 NOTE — Discharge Summary (Signed)
[facility]    Discharge Summary  Patient ID:  Martin Adkins MRN: [id number] DOB/AGE: 70-Feb-1947 70 y.o.  Admit date: 09/24/2017 Discharge date: 09/25/2017 Date of Surgery: 09/24/2017 Surgeon: Surgeon(s): Dew, Erskine Squibb, MD Schnier, Dolores Lory, MD  Admission Diagnosis: AAA (abdominal aortic aneurysm) [facility]) [I71.4]  Discharge Diagnoses:  AAA (abdominal aortic aneurysm) ([place]) [I71.4]  Secondary Diagnoses: Past Medical History:  Diagnosis Date  . AAA (abdominal aortic aneurysm) without rupture ([place]) 04/05/2014  . AAA (abdominal aortic aneurysm) without rupture ([place]) 04/05/2014  . Abdominal aortic aneurysm without rupture ([place])   . Acute non-ST elevation myocardial infarction (NSTEMI) ([place]) 08/15/2014  . 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/28/2014  . APS (antiphospholipid syndrome) ([place])   . Arteriosclerosis of coronary artery 04/05/2014   Overview:  Sp cabg with lima to lad svg to d1 om1 rca 2004 with occluded svg to rca and d1 and pci stetn of om1 graft 2015   . Arthritis   . B12 deficiency 03/21/2017  . Barrett's esophagus   . Benign essential HTN 05/02/2014  . BPH (benign prostatic hyperplasia)   . CAD (coronary artery disease)   . CHF (congestive heart failure) ([place])   . Chronic systolic heart failure ([place]) 04/19/2014   Overview:  With segmental LV systolic dysfunction ejection fraction of 35%   . CLL (chronic lymphocytic leukemia) ([place])   . CLL (chronic lymphocytic leukemia) ([place]) 05/24/2017  . DVT (deep venous thrombosis) ([place])   . Dysrhythmia    ATRIAL FIB.  Marland Kitchen GERD (gastroesophageal reflux disease)   . History of hiatal hernia   . History of shingles 2013  . Hyperlipemia   . Hyperplastic polyps of stomach   . Hypertension   . Myocardial infarction ([place]) 07/30/2014  . NSTEMI (non-ST elevated myocardial infarction) ([place])   . OSA (obstructive sleep apnea)   . Osteoarthritis   . Pre-diabetes   . PVD (peripheral vascular disease) ([place])   . RA  (rheumatoid arthritis) ([place])   . SOB (shortness of breath)    Procedure(s): ENDOVASCULAR REPAIR/STENT GRAFT  Discharged Condition: good  HPI:  The patient is a 72 year old male who presents with an enlarging abdominal aortic aneurysm several years status post endovascular repair. 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  On September 25, 2017, the patient underwent ultrasound guidance for vascular access right femoral artery, Catheter placement into the aorta from right femoral approach, Placement of a right iliac extension limb from the previous stent down to the distal common iliac artery using a 27 mm diameter by 12 cm length right iliac limb with Pro-glide closure device right femoral artery. The patient tolerated the procedure well was transferred to the surgical floor for observation overnight.  The patient's night of surgery was unremarkable.  During the patient's brief inpatient stay his diet was advanced and upon discharge she was tolerating regular diet, he was urinating without complication, he was passing flatus, and he was ambulating independently.  Hospital Course:  ARREN LAMINACK is a 72 y.o. male is S/P:  Procedure(s): ENDOVASCULAR REPAIR/STENT GRAFT  Extubated: POD # 0  Physical exam:  A&Ox3, NAD CV: Irregularly irregular Pulm: CTA Bilaterally Abdomen: Soft, Non-tender, Non-distended, (+) Bowel Sounds Groin: PAD Removed. Incision - clean dry and intact.  No swelling, drainage or ecchymosis noted. Extremities: Thigh soft, calf soft.  Extremities warm.  Bilateral feet are warm.  Faint pedal pulses.  Good  capillary refill.  Post-op wounds clean, dry, intact or healing well  Pt. Ambulating, voiding and taking PO diet without difficulty.  Pt pain controlled with PO pain meds.  Labs as below  Complications: None  Consults: None  Significant Diagnostic  Studies: CBC Lab Results  Component Value Date   WBC 3.7 (L) 09/25/2017   HGB 13.9 09/25/2017   HCT 40.5 09/25/2017   MCV 87.7 09/25/2017   PLT 127 (L) 09/25/2017   BMET    Component Value Date/Time   NA 139 09/25/2017 0521   NA 138 02/13/2014 1254   K 4.4 09/25/2017 0521   K 4.1 02/13/2014 1254   CL 107 09/25/2017 0521   CL 106 02/13/2014 1254   CO2 26 09/25/2017 0521   CO2 26 02/13/2014 1254   GLUCOSE 114 (H) 09/25/2017 0521   GLUCOSE 98 02/13/2014 1254   BUN 12 09/25/2017 0521   BUN 13 02/13/2014 1254   CREATININE 0.71 09/25/2017 0521   CREATININE 0.83 02/13/2014 1254   CALCIUM 8.9 09/25/2017 0521   CALCIUM 8.4 (L) 02/13/2014 1254   GFRNONAA >60 09/25/2017 0521   GFRNONAA >60 02/13/2014 1254   GFRNONAA >60 05/03/2013 0527   GFRAA >60 09/25/2017 0521   GFRAA >60 02/13/2014 1254   GFRAA >60 05/03/2013 0527   COAG Lab Results  Component Value Date   INR 1.09 09/25/2017   INR 1.12 09/24/2017   INR 2.38 09/17/2017   Disposition:  Discharge to :Home  Allergies as of 09/25/2017      Reactions   Ace Inhibitors Other (See Comments)   Other reaction(s): Cough   Pravastatin Other (See Comments)   Other reaction(s): Muscle Pain   Rosuvastatin Other (See Comments)   Other reaction(s): Other (See Comments) GI bleed   Simvastatin Other (See Comments)   Other reaction(s): Muscle Pain   Amoxicillin Rash   Niacin Rash   Penicillins Rash   Has patient had a PCN reaction causing immediate rash, facial/tongue/throat swelling, SOB or lightheadedness with hypotension: Yes Has patient had a PCN reaction causing severe rash involving mucus membranes or skin necrosis: Unknown Has patient had a PCN reaction that required hospitalization: Unknown Has patient had a PCN reaction occurring within the last 10 years: No If all of the above answers are "NO", then may proceed with Cephalosporin use.      Medication List    TAKE these medications   albuterol 108 (90 Base) MCG/ACT  inhaler Commonly known as:  PROVENTIL HFA;VENTOLIN HFA Inhale 2 puffs into the lungs every 6 (six) hours as needed for wheezing or shortness of breath.   allopurinol 300 MG tablet Commonly known as:  ZYLOPRIM Take 1 tablet (300 mg total) by mouth daily.   amLODipine 10 MG tablet Commonly known as:  NORVASC Take 10 mg by mouth daily.   aspirin EC 81 MG tablet Take 81 mg by mouth daily.   atorvastatin 80 MG tablet Commonly known as:  LIPITOR TAKE 1 TABLET (80 MG TOTAL) BY MOUTH ONCE DAILY.   fluticasone 50 MCG/ACT nasal spray Commonly known as:  FLONASE Place 2 sprays into both nostrils daily.   furosemide 20 MG tablet Commonly known as:  LASIX Take 20 mg by mouth daily.   gabapentin 300 MG capsule Commonly known as:  NEURONTIN Take 1 capsule (300 mg total) by mouth 3 (three) times daily.   hydroxychloroquine 200 MG tablet Commonly known as:  PLAQUENIL Take 1 tablet by mouth daily.   Influenza vac split quadrivalent PF 0.5  ML injection Commonly known as:  FLUZONE HIGH-DOSE Inject 0.5 mLs into the muscle tomorrow at 10 am for 1 dose.   Ipratropium-Albuterol 20-100 MCG/ACT Aers respimat Commonly known as:  COMBIVENT Inhale 1 puff into the lungs every 6 (six) hours.   isosorbide mononitrate 30 MG 24 hr tablet Commonly known as:  IMDUR Take 30 mg by mouth daily.   losartan 100 MG tablet Commonly known as:  COZAAR 100 mg daily.   LUBRICANT EYE DROPS 0.4-0.3 % Soln Generic drug:  Polyethyl Glycol-Propyl Glycol Apply 1 drop to eye daily as needed.   metoprolol succinate 100 MG 24 hr tablet Commonly known as:  TOPROL-XL Take 100 mg by mouth daily.   montelukast 10 MG tablet Commonly known as:  SINGULAIR Take 1 tablet (10 mg total) by mouth as needed. Take before Gazyva infusion.   pantoprazole 40 MG tablet Commonly known as:  PROTONIX Take 40 mg by mouth daily.   potassium chloride SA 20 MEQ tablet Commonly known as:  K-DUR,KLOR-CON Take 20 mEq by mouth  daily.   venetoclax 100 MG Tabs Take 400 mg by mouth daily. Take with food and a full glass of water. What changed:  how much to take   vitamin B-12 100 MCG tablet Commonly known as:  CYANOCOBALAMIN Take 1 tablet (100 mcg total) by mouth daily.   warfarin 1 MG tablet Commonly known as:  COUMADIN Take by mouth daily. Patient takes once a week with 5mg  tablet for a total of 6 mg per day   warfarin 5 MG tablet Commonly known as:  COUMADIN TAKE 1 TABLET (5 MG TOTAL) BY MOUTH ONCE DAILY.      The patient was advised to restart his Coumadin dosing as per his normal routine. The patient was also advised to have his INR drawn either Friday or Saturday with the provider who monitors his INR levels. The patient expresses understanding.  Verbal and written Discharge instructions given to the patient. Wound care per Discharge AVS Follow-up Information    Franco Duley, Janalyn Harder, PA-C. Go on 10/02/2017.   Specialty:  Vascular Surgery Why:  @10 :45am  First Post-op. Revision AAA. Incision check. No studies.  Contact information: [place] [address] [phone]          Signed: Sela Hua, PA-C  09/25/2017, 1:41 PM

## 2017-09-30 ENCOUNTER — Inpatient Hospital Stay (HOSPITAL_BASED_OUTPATIENT_CLINIC_OR_DEPARTMENT_OTHER): Payer: Medicare PPO | Admitting: Oncology

## 2017-09-30 ENCOUNTER — Encounter: Payer: Self-pay | Admitting: Oncology

## 2017-09-30 ENCOUNTER — Inpatient Hospital Stay: Payer: Medicare PPO

## 2017-09-30 ENCOUNTER — Other Ambulatory Visit: Payer: Self-pay

## 2017-09-30 VITALS — BP 139/82 | HR 80 | Temp 97.8°F | Resp 16 | Wt 209.6 lb

## 2017-09-30 DIAGNOSIS — D3501 Benign neoplasm of right adrenal gland: Secondary | ICD-10-CM | POA: Diagnosis not present

## 2017-09-30 DIAGNOSIS — Z7901 Long term (current) use of anticoagulants: Secondary | ICD-10-CM

## 2017-09-30 DIAGNOSIS — E538 Deficiency of other specified B group vitamins: Secondary | ICD-10-CM

## 2017-09-30 DIAGNOSIS — G4730 Sleep apnea, unspecified: Secondary | ICD-10-CM

## 2017-09-30 DIAGNOSIS — R05 Cough: Secondary | ICD-10-CM | POA: Diagnosis not present

## 2017-09-30 DIAGNOSIS — I4891 Unspecified atrial fibrillation: Secondary | ICD-10-CM | POA: Diagnosis not present

## 2017-09-30 DIAGNOSIS — C9110 Chronic lymphocytic leukemia of B-cell type not having achieved remission: Secondary | ICD-10-CM | POA: Diagnosis present

## 2017-09-30 DIAGNOSIS — I5022 Chronic systolic (congestive) heart failure: Secondary | ICD-10-CM | POA: Diagnosis not present

## 2017-09-30 DIAGNOSIS — I739 Peripheral vascular disease, unspecified: Secondary | ICD-10-CM

## 2017-09-30 DIAGNOSIS — K2270 Barrett's esophagus without dysplasia: Secondary | ICD-10-CM

## 2017-09-30 DIAGNOSIS — Z5112 Encounter for antineoplastic immunotherapy: Secondary | ICD-10-CM

## 2017-09-30 DIAGNOSIS — R161 Splenomegaly, not elsewhere classified: Secondary | ICD-10-CM

## 2017-09-30 DIAGNOSIS — N400 Enlarged prostate without lower urinary tract symptoms: Secondary | ICD-10-CM | POA: Diagnosis not present

## 2017-09-30 DIAGNOSIS — R531 Weakness: Secondary | ICD-10-CM | POA: Diagnosis not present

## 2017-09-30 DIAGNOSIS — R0602 Shortness of breath: Secondary | ICD-10-CM | POA: Diagnosis not present

## 2017-09-30 DIAGNOSIS — R5383 Other fatigue: Secondary | ICD-10-CM

## 2017-09-30 DIAGNOSIS — D72810 Lymphocytopenia: Secondary | ICD-10-CM

## 2017-09-30 DIAGNOSIS — D696 Thrombocytopenia, unspecified: Secondary | ICD-10-CM | POA: Diagnosis not present

## 2017-09-30 DIAGNOSIS — D6861 Antiphospholipid syndrome: Secondary | ICD-10-CM

## 2017-09-30 DIAGNOSIS — K760 Fatty (change of) liver, not elsewhere classified: Secondary | ICD-10-CM | POA: Diagnosis not present

## 2017-09-30 DIAGNOSIS — I240 Acute coronary thrombosis not resulting in myocardial infarction: Secondary | ICD-10-CM

## 2017-09-30 DIAGNOSIS — I110 Hypertensive heart disease with heart failure: Secondary | ICD-10-CM | POA: Diagnosis not present

## 2017-09-30 DIAGNOSIS — Z8042 Family history of malignant neoplasm of prostate: Secondary | ICD-10-CM

## 2017-09-30 DIAGNOSIS — C9190 Lymphoid leukemia, unspecified not having achieved remission: Secondary | ICD-10-CM

## 2017-09-30 DIAGNOSIS — K219 Gastro-esophageal reflux disease without esophagitis: Secondary | ICD-10-CM | POA: Diagnosis not present

## 2017-09-30 DIAGNOSIS — I252 Old myocardial infarction: Secondary | ICD-10-CM | POA: Diagnosis not present

## 2017-09-30 DIAGNOSIS — Z79899 Other long term (current) drug therapy: Secondary | ICD-10-CM

## 2017-09-30 DIAGNOSIS — E785 Hyperlipidemia, unspecified: Secondary | ICD-10-CM | POA: Diagnosis not present

## 2017-09-30 DIAGNOSIS — I714 Abdominal aortic aneurysm, without rupture: Secondary | ICD-10-CM | POA: Diagnosis not present

## 2017-09-30 DIAGNOSIS — M129 Arthropathy, unspecified: Secondary | ICD-10-CM | POA: Diagnosis not present

## 2017-09-30 DIAGNOSIS — Z7982 Long term (current) use of aspirin: Secondary | ICD-10-CM

## 2017-09-30 DIAGNOSIS — I2510 Atherosclerotic heart disease of native coronary artery without angina pectoris: Secondary | ICD-10-CM | POA: Diagnosis not present

## 2017-09-30 DIAGNOSIS — Z86718 Personal history of other venous thrombosis and embolism: Secondary | ICD-10-CM

## 2017-09-30 DIAGNOSIS — E119 Type 2 diabetes mellitus without complications: Secondary | ICD-10-CM | POA: Diagnosis not present

## 2017-09-30 DIAGNOSIS — F17210 Nicotine dependence, cigarettes, uncomplicated: Secondary | ICD-10-CM

## 2017-09-30 DIAGNOSIS — Z803 Family history of malignant neoplasm of breast: Secondary | ICD-10-CM

## 2017-09-30 LAB — COMPREHENSIVE METABOLIC PANEL
ALBUMIN: 4.2 g/dL (ref 3.5–5.0)
ALK PHOS: 102 U/L (ref 38–126)
ALT: 25 U/L (ref 0–44)
ANION GAP: 9 (ref 5–15)
AST: 29 U/L (ref 15–41)
BUN: 17 mg/dL (ref 8–23)
CO2: 23 mmol/L (ref 22–32)
Calcium: 9 mg/dL (ref 8.9–10.3)
Chloride: 105 mmol/L (ref 98–111)
Creatinine, Ser: 0.85 mg/dL (ref 0.61–1.24)
GFR calc Af Amer: >60 mL/min (ref >60)
GFR calc non Af Amer: >60 mL/min (ref >60)
GLUCOSE: 109 mg/dL — AB (ref 70–99)
POTASSIUM: 3.8 mmol/L (ref 3.5–5.1)
SODIUM: 137 mmol/L (ref 135–145)
Total Bilirubin: 1.6 mg/dL — ABNORMAL HIGH (ref 0.3–1.2)
Total Protein: 7.5 g/dL (ref 6.5–8.1)

## 2017-09-30 LAB — CBC WITH DIFFERENTIAL/PLATELET
BASOS ABS: 0 10*3/uL (ref 0–0.1)
Basophils Relative: 1 %
EOS PCT: 0 %
Eosinophils Absolute: 0 10*3/uL (ref 0–0.7)
HEMATOCRIT: 42 % (ref 40.0–52.0)
Hemoglobin: 14.7 g/dL (ref 13.0–18.0)
LYMPHS ABS: 0.7 10*3/uL — AB (ref 1.0–3.6)
Lymphocytes Relative: 24 %
MCH: 30.4 pg (ref 26.0–34.0)
MCHC: 34.9 g/dL (ref 32.0–36.0)
MCV: 87.2 fL (ref 80.0–100.0)
MONO ABS: 0.7 10*3/uL (ref 0.2–1.0)
MONOS PCT: 22 %
Neutro Abs: 1.7 10*3/uL (ref 1.4–6.5)
Neutrophils Relative %: 53 %
Platelets: 206 10*3/uL (ref 150–440)
RBC: 4.82 MIL/uL (ref 4.40–5.90)
RDW: 17.5 % — ABNORMAL HIGH (ref 11.5–14.5)
WBC: 3.1 10*3/uL — ABNORMAL LOW (ref 3.8–10.6)

## 2017-09-30 LAB — PROTIME-INR
INR: 1.26
Prothrombin Time: 15.7 seconds — ABNORMAL HIGH (ref 11.4–15.2)

## 2017-09-30 LAB — LACTATE DEHYDROGENASE: LDH: 154 U/L (ref 98–192)

## 2017-09-30 LAB — URIC ACID: Uric Acid, Serum: 3.4 mg/dL — ABNORMAL LOW (ref 3.7–8.6)

## 2017-09-30 MED ORDER — VENETOCLAX 100 MG PO TABS: 400.0000 mg | ORAL_TABLET | Freq: Every day | ORAL | 2 refills | Status: DC

## 2017-09-30 NOTE — Progress Notes (Signed)
Patient here today for follow up.   

## 2017-09-30 NOTE — Progress Notes (Signed)
Hematology/Oncology Follow up note [facility] [place] Telephone:([phone] Fax:([phone]   Patient Care Team: [name], MD as PCP - General (Internal Medicine) Earlie Server, MD as Medical Oncologist (Medical Oncology)  REFERRING PROVIDER: [name], MD  REASON FOR VISIT Follow up for management of CLL HISTORY OF PRESENTING ILLNESS:  Martin Adkins is a  72 y.o.  male with PMH listed below who was referred to me for evaluation of lymphocytosis.  Recent lab work on 03/05/2017 showed wbc 11.4, hb 14.6, platelet count 117,000, lymphocytosis 63.5%, neutrophil 22%, Report chronic fatigue, no weight loss, fever or chills.  He reports feeling chest "rattleness". Has for CT chest which showed acute finding, chronic finding includes  postsurgical changes consistent with coronary bypass grafting. One of the bypass grafts arising from the aorta shows apparent thrombosis and an area of distal aneurysmal dilatation which is also Thrombosed. Right adrenal adenoma stable in appearance.Mild scarring in the lung bases.Fatty liver.   Takes Aspirin 58m, coumadine, plavix. He is on anticoagulation for previous history of clots.  Denies any bleeding events. Use to drink hard liquir daily, quitted for 4-5 years. Current drinks wine on weekend.    Image Studies # 04/04/2017  [place]dom[place] showed 1.  Splenomegaly.  No focal splenic lesions evident.2. Increased liver echogenicity, a finding most likely indicative of hepatic steatosis. While no focal liver lesions are evident on this study, it must be cautioned that the sensitivity of ultrasound for detection of focal liver lesions is diminished in this circumstance.3. Pancreas obscured by gas. Portions of inferior vena cava obscured by gas.4.  Gallbladder absent.5. Status post abdominal aortic aneurysm repair. No periaortic fluid.  # 04/21/2017 PET scan 1. Moderate splenomegaly with uniform splenic hypermetabolism,compatible  with the provided history of lymphoproliferative disease.No additional hypermetabolic sites of lymphoproliferative disease. Specifically, no hypermetabolic lymphadenopathy Hepatic steatosis, aortic atherosclerosis. Aneurysm.    # CLL, stage IV disease given spelencemagly, thrombocytopenia, symptomatic with fatigue and weight loss.  CLL IPL score 4, high risk.   INTERVAL HISTORY Martin JTAGGERT BOZZIis a 72y.o. male who has above presents for assessment of treatment toxicity.  During last visit, her Venetoclax does have been decreased to 300 mg daily due to thrombocytopenia, being on chronic anticoagulation and having upcoming aortic aneurysm repair surgery. He is status post abdominal aortic aneurysm repair on 09/25/2017 tolerates well.  He is on chronic anticoagulation, follows up with cardiology/PCP for Coumadin dosing.  He prefers to get his INR done along with his other labs center.  Today's INR is 1.26. No new complaints.  Fatigue has improved.  Review of Systems  Constitutional: Positive for malaise/fatigue. Negative for chills, fever and weight loss.  HENT: Negative for congestion, ear discharge, ear pain, nosebleeds, sinus pain and sore throat.   Eyes: Negative for double vision, photophobia, pain, discharge and redness.  Respiratory: Negative for cough, hemoptysis, sputum production, shortness of breath and wheezing.   Cardiovascular: Negative for chest pain, palpitations, orthopnea, claudication and leg swelling.  Gastrointestinal: Negative for abdominal pain, blood in stool, constipation, diarrhea, heartburn, melena, nausea and vomiting.  Genitourinary: Negative for dysuria, flank pain, frequency, hematuria and urgency.  Musculoskeletal: Negative for back pain, myalgias and neck pain.  Skin: Negative for itching and rash.  Neurological: Negative for dizziness, tingling, tremors, speech change, focal weakness, weakness and headaches.  Endo/Heme/Allergies: Negative for environmental  allergies. Bruises/bleeds easily.  Psychiatric/Behavioral: Negative for depression, hallucinations and suicidal ideas. The patient is not nervous/anxious.  MEDICAL HISTORY:  Past Medical History:  Diagnosis Date  . AAA (abdominal aortic aneurysm) without rupture ([place]) 04/05/2014  . AAA (abdominal aortic aneurysm) without rupture ([place]) 04/05/2014  . Abdominal aortic aneurysm without rupture ([place])   . Acute non-ST elevation myocardial infarction (NSTEMI) ([place]) 08/15/2014  . 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/28/2014  . APS (antiphospholipid syndrome) ([place])   . Arteriosclerosis of coronary artery 04/05/2014   Overview:  Sp cabg with lima to lad svg to d1 om1 rca 2004 with occluded svg to rca and d1 and pci stetn of om1 graft 2015   . Arthritis   . B12 deficiency 03/21/2017  . Barrett's esophagus   . Benign essential HTN 05/02/2014  . BPH (benign prostatic hyperplasia)   . CAD (coronary artery disease)   . CHF (congestive heart failure) ([place])   . Chronic systolic heart failure ([place]) 04/19/2014   Overview:  With segmental LV systolic dysfunction ejection fraction of 35%   . CLL (chronic lymphocytic leukemia) ([place])   . CLL (chronic lymphocytic leukemia) ([place]) 05/24/2017  . DVT (deep venous thrombosis) ([place])   . Dysrhythmia    ATRIAL FIB.  Marland Kitchen GERD (gastroesophageal reflux disease)   . History of hiatal hernia   . History of shingles 2013  . Hyperlipemia   . Hyperplastic polyps of stomach   . Hypertension   . Myocardial infarction ([place]) 07/30/2014  . NSTEMI (non-ST elevated myocardial infarction) ([place])   . OSA (obstructive sleep apnea)   . Osteoarthritis   . Pre-diabetes   . PVD (peripheral vascular disease) ([place])   . RA (rheumatoid arthritis) ([place])   . SOB (shortness of breath)     SURGICAL HISTORY: Past Surgical History:  Procedure Laterality Date  . ABDOMINAL AORTA STENT    . CHOLECYSTECTOMY    . COLONOSCOPY  11/24/2009  . CORONARY ANGIOPLASTY WITH STENT PLACEMENT  2015  .  CORONARY ARTERY BYPASS GRAFT    . ENDOVASCULAR REPAIR/STENT GRAFT N/A 09/24/2017   Procedure: ENDOVASCULAR REPAIR/STENT GRAFT;  Surgeon: Algernon Huxley, MD;  Location: [place] [facility];  Service: Cardiovascular;  Laterality: N/A;  . ESOPHAGOGASTRODUODENOSCOPY  05/26/02  03/23/12  . ESOPHAGOGASTRODUODENOSCOPY (EGD) WITH PROPOFOL N/A 10/28/2016   Procedure: ESOPHAGOGASTRODUODENOSCOPY (EGD) WITH PROPOFOL;  Surgeon: Manya Silvas, MD;  Location: [facility];  Service: Endoscopy;  Laterality: N/A;  . PERIPHERAL VASCULAR CATHETERIZATION N/A 09/06/2014   Procedure: IVC Filter Removal;  Surgeon: Katha Cabal, MD;  Location: [place] [facility];  Service: Cardiovascular;  Laterality: N/A;  . TRANSURETHRAL RESECTION OF PROSTATE N/A 02/20/2015   Procedure: TRANSURETHRAL RESECTION OF THE PROSTATE (TURP);  Surgeon: Hollice Espy, MD;  Location: [facility] OR[facility];  Service: Urology;  Laterality: N/A;    SOCIAL HISTORY: Social History   Socioeconomic History  . Marital status: Legally Separated    Spouse name: Not on file  . Number of children: Not on file  . Years of education: Not on file  . Highest education level: Not on file  Occupational History  . Not on file  Social Needs  . Financial resource strain: Not hard at all  . Food insecurity:    Worry: Never true    Inability: Never true  . Transportation needs:    Medical: No    Non-medical: No  Tobacco Use  . Smoking status: Current Some Day Smoker    Packs/day: 0.25    Years: 20.00    Pack years: 5.00    Types: Cigarettes  . Smokeless tobacco: Never Used  .  Tobacco comment: per pt nicotine patch number75m -smokes 1-3 cig/per d now  Substance and Sexual Activity  . Alcohol use: Yes    Alcohol/week: 0.0 standard drinks    Comment: social on weekends  . Drug use: No  . Sexual activity: Never  Lifestyle  . Physical activity:    Days per week: 7 days    Minutes per session: 40 min  . Stress: Not at all  Relationships  . Social  connections:    Talks on phone: More than three times a week    Gets together: More than three times a week    Attends religious service: More than 4 times per year    Active member of club or organization: No    Attends meetings of clubs or organizations: Never    Relationship status: Separated  . Intimate partner violence:    Fear of current or ex partner: No    Emotionally abused: No    Physically abused: No    Forced sexual activity: No  Other Topics Concern  . Not on file  Social History Narrative  . Not on file    FAMILY HISTORY: Family History  Problem Relation Age of Onset  . Cancer Mother [phone]      breast ca  . Diabetes Mother   . Coronary artery disease Father   . Heart attack Father   . Prostate cancer Brother   . Bladder Cancer Neg Hx   . Kidney cancer Neg Hx     ALLERGIES:  is allergic to ace inhibitors; pravastatin; rosuvastatin; simvastatin; amoxicillin; niacin; and penicillins.  MEDICATIONS:  Current Outpatient Medications  Medication Sig Dispense Refill  . albuterol (PROVENTIL HFA;VENTOLIN HFA) 108 (90 Base) MCG/ACT inhaler Inhale 2 puffs into the lungs every 6 (six) hours as needed for wheezing or shortness of breath. 1 Inhaler 0  . allopurinol (ZYLOPRIM) 300 MG tablet Take 1 tablet (300 mg total) by mouth daily. 30 tablet 0  . amLODipine (NORVASC) 10 MG tablet Take 10 mg by mouth daily.     .Marland Kitchenaspirin EC 81 MG tablet Take 81 mg by mouth daily.    .Marland Kitchenatorvastatin (LIPITOR) 80 MG tablet TAKE 1 TABLET (80 MG TOTAL) BY MOUTH ONCE DAILY.  11  . fluticasone (FLONASE) 50 MCG/ACT nasal spray Place 2 sprays into both nostrils daily.     . furosemide (LASIX) 20 MG tablet Take 20 mg by mouth daily.  11  . gabapentin (NEURONTIN) 300 MG capsule Take 1 capsule (300 mg total) by mouth 3 (three) times daily. 90 capsule 0  . hydroxychloroquine (PLAQUENIL) 200 MG tablet Take 1 tablet by mouth daily.    . Ipratropium-Albuterol (COMBIVENT) 20-100 MCG/ACT AERS respimat Inhale  1 puff into the lungs every 6 (six) hours. 1 Inhaler 11  . isosorbide mononitrate (IMDUR) 30 MG 24 hr tablet Take 30 mg by mouth daily.     .Marland Kitchenlosartan (COZAAR) 100 MG tablet 100 mg daily.     .Phillis KnackEYE DROPS 0.4-0.3 % SOLN Apply 1 drop to eye daily as needed.     . metoprolol succinate (TOPROL-XL) 100 MG 24 hr tablet Take 100 mg by mouth daily.     . montelukast (SINGULAIR) 10 MG tablet Take 1 tablet (10 mg total) by mouth as needed. Take before Gazyva infusion. 20 tablet 0  . pantoprazole (PROTONIX) 40 MG tablet Take 40 mg by mouth daily.     . potassium chloride SA (K-DUR,KLOR-CON) 20 MEQ tablet Take 20  mEq by mouth daily.     Marland Kitchen venetoclax 100 MG TABS Take 400 mg by mouth daily. Take with food and a full glass of water. (Patient taking differently: Take 300 mg by mouth daily. Take with food and a full glass of water.) 120 tablet 2  . vitamin B-12 (CYANOCOBALAMIN) 100 MCG tablet Take 1 tablet (100 mcg total) by mouth daily. 90 tablet 1  . warfarin (COUMADIN) 1 MG tablet Take by mouth daily. Patient takes once a week with 49m tablet for a total of 6 mg per day    . warfarin (COUMADIN) 5 MG tablet TAKE 1 TABLET (5 MG TOTAL) BY MOUTH ONCE DAILY.  5   No current facility-administered medications for this visit.      PHYSICAL EXAMINATION: ECOG 1 Vitals:   09/30/17 1008  BP: 139/82  Pulse: 80  Resp: 16  Temp: 97.8 F (36.6 C)  Physical Exam  Constitutional: He is oriented to person, place, and time. No distress.  HENT:  Head: Normocephalic and atraumatic.  Nose: Nose normal.  Mouth/Throat: Oropharynx is clear and moist. No oropharyngeal exudate.  Eyes: Pupils are equal, round, and reactive to light. EOM are normal. Left eye exhibits no discharge. No scleral icterus.  Neck: Normal range of motion. Neck supple. No JVD present.  Cardiovascular: Normal rate, regular rhythm and normal heart sounds.  No murmur heard. Pulmonary/Chest: Effort normal. No respiratory distress. He has no  wheezes. He has no rales. He exhibits no tenderness.  Abdominal: Soft. He exhibits no distension and no mass. There is no tenderness.  Musculoskeletal: Normal range of motion. He exhibits no edema, tenderness or deformity.  Lymphadenopathy:    He has no cervical adenopathy.  Neurological: He is alert and oriented to person, place, and time. No cranial nerve deficit. He exhibits normal muscle tone. Coordination normal.  Skin: Skin is warm and dry. No rash noted. He is not diaphoretic. No erythema.  Psychiatric: Affect and judgment normal.  Nursing note and vitals reviewed.    LABORATORY DATA:  I have reviewed the data as listed Lab Results  Component Value Date   WBC 3.1 (L) 09/30/2017   HGB 14.7 09/30/2017   HCT 42.0 09/30/2017   MCV 87.2 09/30/2017   PLT 206 09/30/2017   Recent Labs    04/09/17 1155  09/16/17 0802  09/22/17 0843 09/25/17 0521 09/30/17 0943  NA  --    < > 138   < > 137 139 137  K  --    < > 4.0   < > 3.8 4.4 3.8  CL  --    < > 105   < > 106 107 105  CO2  --    < > 23   < > _0 GLUCOSE  --    < > 131*   < > 142* 114* 109*  BUN  --    < > 19   < > _1 CREATININE  --    < > 0.88   < > 0.89 0.71 0.85  CALCIUM  --    < > 9.0   < > 9.0 8.9 9.0  GFRNONAA  --    < > >60   < > >60 >60 >60  GFRAA  --    < > >60   < > >60 >60 >60  PROT  --    < > 7.5  --  7.5  --  7.5  ALBUMIN  --    < >  4.3  --  4.4  --  4.2  AST  --    < > 41  --  39  --  29  ALT  --    < > 28  --  39  --  25  ALKPHOS  --    < > 98  --  89  --  102  BILITOT 1.7*   < > 1.8*  --  2.1*  --  1.6*  BILIDIR 0.3  --   --   --   --   --   --    < > = values in this interval not displayed.   Hepatitis panel negative.  LDH 228, mildly elevated Beta 2 microglobulin normal.   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 The morphologic and immunophenotypic features are most consistent with chronic lymphocytic leukemia/small lymphocytic  lymphoma. Bone marrow Cytogenetics : normal.  Peripheral blood FISH panel negative for CCND1- IGH mutation, ATM, 12, 13q, Tp53 mutation.   RADIOGRAPHIC STUDIES: I have personally reviewed the radiological images as listed and agreed with the findings in the report. CT angio abd/pelvis showed Type 1b endoleak, AAA aneurysm, Spleen has decreased in size since PET scan in April 2019.   ASSESSMENT & PLAN:  72 yo male presents for immunotherapy treatment of Stage IV CLL. 1. CLL (chronic lymphocytic leukemia) ([place])   2. Long term current use of anticoagulant therapy   3. Thrombocytopenia ([place])    # CLL/thrombocytopenia Labs reviewed and discussed with patient.   Venetoclax dose was reduced to 300 mg daily at the last visit.  Thrombocytopenia improved and he also finished his aortic aneurysm repair. Advised patient to go back to Venetoclax 400 mg daily. He will follow-up in the clinic with labs and assessment prior to cycle 6 Gazyva treatment. Continue allopurinol # Thrombocytopenia, Grade 1 due to Venetoclax.  Resolved.  See above. #.Lymphopenia, stable.  Continue to monitor.Marland Kitchen   #Chronic anticoagulation, patient chose to have INR repeated at the cancer center along with his other labs.  Faxed a copy of INR results to Dr. Ander Purpura office. Patient knows to follow up with Dr.Kowaski's office for dosing instruction if he does not get updates from them.   We spent sufficient time to discuss many aspect of care, questions were answered to patient's satisfaction.  The patient knows to call the clinic with any problems questions or concerns.  Return of visit: 2 weeks for assessment.  Total face to face encounter time for this patient visit was  15 min. >50% of the time was  spent in counseling and coordination of care.    Earlie Server, MD, PhD Hematology Oncology [facility] Pager- [phone] 09/30/2017

## 2017-10-02 ENCOUNTER — Encounter (INDEPENDENT_AMBULATORY_CARE_PROVIDER_SITE_OTHER): Payer: Self-pay | Admitting: Vascular Surgery

## 2017-10-02 ENCOUNTER — Ambulatory Visit (INDEPENDENT_AMBULATORY_CARE_PROVIDER_SITE_OTHER): Payer: Medicare PPO | Admitting: Vascular Surgery

## 2017-10-02 VITALS — BP 114/78 | HR 89 | Resp 16 | Ht 72.0 in | Wt 207.8 lb

## 2017-10-02 DIAGNOSIS — E782 Mixed hyperlipidemia: Secondary | ICD-10-CM

## 2017-10-02 DIAGNOSIS — E118 Type 2 diabetes mellitus with unspecified complications: Secondary | ICD-10-CM

## 2017-10-02 DIAGNOSIS — F17210 Nicotine dependence, cigarettes, uncomplicated: Secondary | ICD-10-CM

## 2017-10-02 DIAGNOSIS — I713 Abdominal aortic aneurysm, ruptured, unspecified: Secondary | ICD-10-CM

## 2017-10-02 DIAGNOSIS — I714 Abdominal aortic aneurysm, without rupture: Secondary | ICD-10-CM

## 2017-10-02 NOTE — Progress Notes (Signed)
Subjective:    Patient ID: Martin Adkins, male    DOB: [birth year], 72 y.o.   MRN: [id number] Chief Complaint  Patient presents with  . Follow-up    [facility] 1week follow up   The patient presents for his first postoperative follow-up.  The patient is status post a ultrasound guidance for vascular access right femoral artery, catheter placement into the aorta from right femoral approach, placement of a right iliac extension limb from the previous stent down to the distal common iliac artery using a 27 mm diameter by 12 cm length right iliac limb with Pro-glide closure device right femoral artery on 09/24/17.  The patient presents today without complaint.  The patient denies any abdominal pain, back pain or thrombosis to the bilateral lower extremity.  The patient denies any issues with his access sites.  The patient denies any fever, nausea vomiting.  Review of Systems  Constitutional: Negative.   HENT: Negative.   Eyes: Negative.   Respiratory: Negative.   Cardiovascular:       AAA  Gastrointestinal: Negative.   Endocrine: Negative.   Genitourinary: Negative.   Musculoskeletal: Negative.   Skin: Negative.   Allergic/Immunologic: Negative.   Neurological: Negative.   Hematological: Negative.   Psychiatric/Behavioral: Negative.       Objective:   Physical Exam  Constitutional: He is oriented to person, place, and time. He appears well-developed and well-nourished. No distress.  HENT:  Head: Normocephalic and atraumatic.  Right Ear: External ear normal.  Left Ear: External ear normal.  Eyes: Pupils are equal, round, and reactive to light. Conjunctivae and EOM are normal.  Neck: Normal range of motion.  Cardiovascular: Normal rate, regular rhythm, normal heart sounds and intact distal pulses.  Pulses:      Radial pulses are 2+ on the right side, and 2+ on the left side.  Hard to palpate pedal pulses however the bilateral feet are warm and there is good capillary refill    Pulmonary/Chest: Effort normal and breath sounds normal.  Abdominal: Soft. Bowel sounds are normal. He exhibits no distension. There is no tenderness.  Musculoskeletal: Normal range of motion. He exhibits no edema.  Neurological: He is alert and oriented to person, place, and time.  Skin: Skin is warm and dry. He is not diaphoretic.  The patient's right groin access site is healed.  Psychiatric: He has a normal mood and affect. His behavior is normal. Judgment and thought content normal.  Vitals reviewed.  BP 114/78 (BP Location: Right Arm)   Pulse 89   Resp 16   Ht 6' (1.829 m)   Wt 207 lb 12.8 oz (94.3 kg)   BMI 28.18 kg/m   Past Medical History:  Diagnosis Date  . AAA (abdominal aortic aneurysm) without rupture ([place]) 04/05/2014  . AAA (abdominal aortic aneurysm) without rupture ([place]) 04/05/2014  . Abdominal aortic aneurysm without rupture ([place])   . Acute non-ST elevation myocardial infarction (NSTEMI) ([place]) 08/15/2014  . 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/28/2014  . APS (antiphospholipid syndrome) ([place])   . Arteriosclerosis of coronary artery 04/05/2014   Overview:  Sp cabg with lima to lad svg to d1 om1 rca 2004 with occluded svg to rca and d1 and pci stetn of om1 graft 2015   . Arthritis   . B12 deficiency 03/21/2017  . Barrett's esophagus   . Benign essential HTN 05/02/2014  . BPH (benign prostatic hyperplasia)   . CAD (coronary artery disease)   . CHF (congestive heart failure) ([place])   .  Chronic systolic heart failure ([place]) 04/19/2014   Overview:  With segmental LV systolic dysfunction ejection fraction of 35%   . CLL (chronic lymphocytic leukemia) ([place])   . CLL (chronic lymphocytic leukemia) ([place]) 05/24/2017  . DVT (deep venous thrombosis) ([place])   . Dysrhythmia    ATRIAL FIB.  Marland Kitchen GERD (gastroesophageal reflux disease)   . History of hiatal hernia   . History of shingles 2013  . Hyperlipemia   . Hyperplastic polyps of stomach   . Hypertension   . Myocardial infarction  ([place]) 07/30/2014  . NSTEMI (non-ST elevated myocardial infarction) ([place])   . OSA (obstructive sleep apnea)   . Osteoarthritis   . Pre-diabetes   . PVD (peripheral vascular disease) ([place])   . RA (rheumatoid arthritis) ([place])   . SOB (shortness of breath)    Social History   Socioeconomic History  . Marital status: Legally Separated    Spouse name: Not on file  . Number of children: Not on file  . Years of education: Not on file  . Highest education level: Not on file  Occupational History  . Not on file  Social Needs  . Financial resource strain: Not hard at all  . Food insecurity:    Worry: Never true    Inability: Never true  . Transportation needs:    Medical: No    Non-medical: No  Tobacco Use  . Smoking status: Current Some Day Smoker    Packs/day: 0.25    Years: 20.00    Pack years: 5.00    Types: Cigarettes  . Smokeless tobacco: Never Used  . Tobacco comment: per pt nicotine patch number7mg  -smokes 1-3 cig/per d now  Substance and Sexual Activity  . Alcohol use: Yes    Alcohol/week: 0.0 standard drinks    Comment: social on weekends  . Drug use: No  . Sexual activity: Never  Lifestyle  . Physical activity:    Days per week: 7 days    Minutes per session: 40 min  . Stress: Not at all  Relationships  . Social connections:    Talks on phone: More than three times a week    Gets together: More than three times a week    Attends religious service: More than 4 times per year    Active member of club or organization: No    Attends meetings of clubs or organizations: Never    Relationship status: Separated  . Intimate partner violence:    Fear of current or ex partner: No    Emotionally abused: No    Physically abused: No    Forced sexual activity: No  Other Topics Concern  . Not on file  Social History Narrative  . Not on file   Past Surgical History:  Procedure Laterality Date  . ABDOMINAL AORTA STENT    . CHOLECYSTECTOMY    . COLONOSCOPY  11/24/2009    . CORONARY ANGIOPLASTY WITH STENT PLACEMENT  2015  . CORONARY ARTERY BYPASS GRAFT    . ENDOVASCULAR REPAIR/STENT GRAFT N/A 09/24/2017   Procedure: ENDOVASCULAR REPAIR/STENT GRAFT;  Surgeon: Algernon Huxley, MD;  Location: [facility];  Service: Cardiovascular;  Laterality: N/A;  . ESOPHAGOGASTRODUODENOSCOPY  05/26/02  03/23/12  . ESOPHAGOGASTRODUODENOSCOPY (EGD) WITH PROPOFOL N/A 10/28/2016   Procedure: ESOPHAGOGASTRODUODENOSCOPY (EGD) WITH PROPOFOL;  Surgeon: Manya Silvas, MD;  Location: [facility] END[facility]COPY;  Service: Endoscopy;  Laterality: N/A;  . PERIPHERAL VASCULAR CATHETERIZATION N/A 09/06/2014   Procedure: IVC Filter Removal;  Surgeon: Dolores Lory  Schnier, MD;  Location: [facility];  Service: Cardiovascular;  Laterality: N/A;  . TRANSURETHRAL RESECTION OF PROSTATE N/A 02/20/2015   Procedure: TRANSURETHRAL RESECTION OF THE PROSTATE (TURP);  Surgeon: Hollice Espy, MD;  Location: [facility] OR[facility];  Service: Urology;  Laterality: N/A;   Family History  Problem Relation Age of Onset  . Cancer Mother 45       breast ca  . Diabetes Mother   . Coronary artery disease Father   . Heart attack Father   . Prostate cancer Brother   . Bladder Cancer Neg Hx   . Kidney cancer Neg Hx    Allergies  Allergen Reactions  . Ace Inhibitors Other (See Comments)    Other reaction(s): Cough  . Pravastatin Other (See Comments)    Other reaction(s): Muscle Pain  . Rosuvastatin Other (See Comments)    Other reaction(s): Other (See Comments) GI bleed  . Simvastatin Other (See Comments)    Other reaction(s): Muscle Pain  . Amoxicillin Rash  . Niacin Rash  . Penicillins Rash    Has patient had a PCN reaction causing immediate rash, facial/tongue/throat swelling, SOB or lightheadedness with hypotension: Yes Has patient had a PCN reaction causing severe rash involving mucus membranes or skin necrosis: Unknown Has patient had a PCN reaction that required hospitalization: Unknown Has patient had a  PCN reaction occurring within the last 10 years: No If all of the above answers are "NO", then may proceed with Cephalosporin use.      Assessment & Plan:  The patient presents for his first postoperative follow-up.  The patient is status post a ultrasound guidance for vascular access right femoral artery, catheter placement into the aorta from right femoral approach, placement of a right iliac extension limb from the previous stent down to the distal common iliac artery using a 27 mm diameter by 12 cm length right iliac limb with Pro-glide closure device right femoral artery on 09/24/17.  The patient presents today without complaint.  The patient denies any abdominal pain, back pain or thrombosis to the bilateral lower extremity.  The patient denies any issues with his access sites.  The patient denies any fever, nausea vomiting.  1. Leaking abdominal aortic aneurysm (AAA) ([place]) - Stable Status post revision on 09/24/2017 The patient's postoperative course has been unremarkable Patient presents today symptomatically Patient's physical exam is unremarkable.  His right groin access site is healed. The patient back in approximately 1 month and have him undergo an EVAR  - VAS [place] EVAR DUPLEX; Future  2. Type 2 diabetes mellitus with complication, unspecified whether long term insulin use (HCC) - Stable Encouraged good control as its slows the progression of atherosclerotic disease  3. Combined fat and carbohydrate induced hyperlipemia - Stable Encouraged good control as its slows the progression of atherosclerotic disease  Current Outpatient Medications on File Prior to Visit  Medication Sig Dispense Refill  . albuterol (PROVENTIL HFA;VENTOLIN HFA) 108 (90 Base) MCG/ACT inhaler Inhale 2 puffs into the lungs every 6 (six) hours as needed for wheezing or shortness of breath. 1 Inhaler 0  . allopurinol (ZYLOPRIM) 300 MG tablet Take 1 tablet (300 mg total) by mouth daily. 30 tablet 0  . amLODipine  (NORVASC) 10 MG tablet Take 10 mg by mouth daily.     Marland Kitchen aspirin EC 81 MG tablet Take 81 mg by mouth daily.    Marland Kitchen atorvastatin (LIPITOR) 80 MG tablet TAKE 1 TABLET (80 MG TOTAL) BY MOUTH ONCE DAILY.  11  .  fluticasone (FLONASE) 50 MCG/ACT nasal spray Place 2 sprays into both nostrils daily.     . furosemide (LASIX) 20 MG tablet Take 20 mg by mouth daily.  11  . gabapentin (NEURONTIN) 300 MG capsule Take 1 capsule (300 mg total) by mouth 3 (three) times daily. 90 capsule 0  . hydroxychloroquine (PLAQUENIL) 200 MG tablet Take 1 tablet by mouth daily.    . Ipratropium-Albuterol (COMBIVENT) 20-100 MCG/ACT AERS respimat Inhale 1 puff into the lungs every 6 (six) hours. 1 Inhaler 11  . isosorbide mononitrate (IMDUR) 30 MG 24 hr tablet Take 30 mg by mouth daily.     Marland Kitchen losartan (COZAAR) 100 MG tablet 100 mg daily.     Phillis Knack EYE DROPS 0.4-0.3 % SOLN Apply 1 drop to eye daily as needed.     . metoprolol succinate (TOPROL-XL) 100 MG 24 hr tablet Take 100 mg by mouth daily.     . montelukast (SINGULAIR) 10 MG tablet Take 1 tablet (10 mg total) by mouth as needed. Take before Gazyva infusion. 20 tablet 0  . pantoprazole (PROTONIX) 40 MG tablet Take 40 mg by mouth daily.     . potassium chloride SA (K-DUR,KLOR-CON) 20 MEQ tablet Take 20 mEq by mouth daily.     Marland Kitchen venetoclax 100 MG TABS Take 400 mg by mouth daily. Take with food and a full glass of water. 120 tablet 2  . vitamin B-12 (CYANOCOBALAMIN) 100 MCG tablet Take 1 tablet (100 mcg total) by mouth daily. 90 tablet 1  . warfarin (COUMADIN) 1 MG tablet Take by mouth daily. Patient takes once a week with 5mg  tablet for a total of 6 mg per day    . warfarin (COUMADIN) 5 MG tablet TAKE 1 TABLET (5 MG TOTAL) BY MOUTH ONCE DAILY.  5   No current facility-administered medications on file prior to visit.    There are no Patient Instructions on file for this visit. No follow-ups on file.  Steed Kanaan A Murray Durrell, PA-C

## 2017-10-08 MED FILL — VENCLEXTA 100 MG TABS: 100 | 30 days supply | Qty: 120 | Fill #1

## 2017-10-14 ENCOUNTER — Encounter: Payer: Self-pay | Admitting: Oncology

## 2017-10-14 ENCOUNTER — Inpatient Hospital Stay: Payer: Medicare PPO

## 2017-10-14 ENCOUNTER — Other Ambulatory Visit: Payer: Self-pay

## 2017-10-14 ENCOUNTER — Inpatient Hospital Stay (HOSPITAL_BASED_OUTPATIENT_CLINIC_OR_DEPARTMENT_OTHER): Payer: Medicare PPO | Admitting: Oncology

## 2017-10-14 ENCOUNTER — Inpatient Hospital Stay: Payer: Medicare PPO | Attending: Oncology

## 2017-10-14 VITALS — BP 131/85 | HR 78 | Temp 97.2°F | Resp 16 | Wt 207.4 lb

## 2017-10-14 DIAGNOSIS — I2510 Atherosclerotic heart disease of native coronary artery without angina pectoris: Secondary | ICD-10-CM | POA: Diagnosis not present

## 2017-10-14 DIAGNOSIS — M129 Arthropathy, unspecified: Secondary | ICD-10-CM

## 2017-10-14 DIAGNOSIS — I10 Essential (primary) hypertension: Secondary | ICD-10-CM | POA: Diagnosis not present

## 2017-10-14 DIAGNOSIS — M069 Rheumatoid arthritis, unspecified: Secondary | ICD-10-CM

## 2017-10-14 DIAGNOSIS — E785 Hyperlipidemia, unspecified: Secondary | ICD-10-CM | POA: Insufficient documentation

## 2017-10-14 DIAGNOSIS — Z79899 Other long term (current) drug therapy: Secondary | ICD-10-CM

## 2017-10-14 DIAGNOSIS — Z86718 Personal history of other venous thrombosis and embolism: Secondary | ICD-10-CM | POA: Insufficient documentation

## 2017-10-14 DIAGNOSIS — R161 Splenomegaly, not elsewhere classified: Secondary | ICD-10-CM | POA: Diagnosis not present

## 2017-10-14 DIAGNOSIS — I252 Old myocardial infarction: Secondary | ICD-10-CM | POA: Diagnosis not present

## 2017-10-14 DIAGNOSIS — R5383 Other fatigue: Secondary | ICD-10-CM | POA: Diagnosis not present

## 2017-10-14 DIAGNOSIS — E538 Deficiency of other specified B group vitamins: Secondary | ICD-10-CM | POA: Diagnosis not present

## 2017-10-14 DIAGNOSIS — I739 Peripheral vascular disease, unspecified: Secondary | ICD-10-CM

## 2017-10-14 DIAGNOSIS — Z7901 Long term (current) use of anticoagulants: Secondary | ICD-10-CM | POA: Insufficient documentation

## 2017-10-14 DIAGNOSIS — K449 Diaphragmatic hernia without obstruction or gangrene: Secondary | ICD-10-CM | POA: Diagnosis not present

## 2017-10-14 DIAGNOSIS — I4891 Unspecified atrial fibrillation: Secondary | ICD-10-CM | POA: Diagnosis not present

## 2017-10-14 DIAGNOSIS — N400 Enlarged prostate without lower urinary tract symptoms: Secondary | ICD-10-CM | POA: Insufficient documentation

## 2017-10-14 DIAGNOSIS — G4730 Sleep apnea, unspecified: Secondary | ICD-10-CM

## 2017-10-14 DIAGNOSIS — I509 Heart failure, unspecified: Secondary | ICD-10-CM

## 2017-10-14 DIAGNOSIS — C9110 Chronic lymphocytic leukemia of B-cell type not having achieved remission: Secondary | ICD-10-CM

## 2017-10-14 DIAGNOSIS — Z7982 Long term (current) use of aspirin: Secondary | ICD-10-CM | POA: Insufficient documentation

## 2017-10-14 DIAGNOSIS — C9190 Lymphoid leukemia, unspecified not having achieved remission: Secondary | ICD-10-CM | POA: Insufficient documentation

## 2017-10-14 DIAGNOSIS — R05 Cough: Secondary | ICD-10-CM

## 2017-10-14 DIAGNOSIS — K219 Gastro-esophageal reflux disease without esophagitis: Secondary | ICD-10-CM | POA: Insufficient documentation

## 2017-10-14 DIAGNOSIS — D696 Thrombocytopenia, unspecified: Secondary | ICD-10-CM

## 2017-10-14 DIAGNOSIS — Z803 Family history of malignant neoplasm of breast: Secondary | ICD-10-CM

## 2017-10-14 DIAGNOSIS — Z5112 Encounter for antineoplastic immunotherapy: Secondary | ICD-10-CM | POA: Diagnosis present

## 2017-10-14 DIAGNOSIS — K760 Fatty (change of) liver, not elsewhere classified: Secondary | ICD-10-CM

## 2017-10-14 DIAGNOSIS — Z8042 Family history of malignant neoplasm of prostate: Secondary | ICD-10-CM

## 2017-10-14 DIAGNOSIS — F17210 Nicotine dependence, cigarettes, uncomplicated: Secondary | ICD-10-CM

## 2017-10-14 DIAGNOSIS — D72810 Lymphocytopenia: Secondary | ICD-10-CM

## 2017-10-14 DIAGNOSIS — Z5111 Encounter for antineoplastic chemotherapy: Secondary | ICD-10-CM | POA: Diagnosis not present

## 2017-10-14 LAB — CBC WITH DIFFERENTIAL/PLATELET
BASOS ABS: 0.1 10*3/uL (ref 0–0.1)
BASOS PCT: 3 %
EOS ABS: 0 10*3/uL (ref 0–0.7)
Eosinophils Relative: 0 %
HCT: 43.6 % (ref 40.0–52.0)
HEMOGLOBIN: 15.2 g/dL (ref 13.0–18.0)
Lymphocytes Relative: 19 %
Lymphs Abs: 0.6 10*3/uL — ABNORMAL LOW (ref 1.0–3.6)
MCH: 30.4 pg (ref 26.0–34.0)
MCHC: 34.8 g/dL (ref 32.0–36.0)
MCV: 87.5 fL (ref 80.0–100.0)
MONOS PCT: 7 %
Monocytes Absolute: 0.2 10*3/uL (ref 0.2–1.0)
NEUTROS ABS: 2.4 10*3/uL (ref 1.4–6.5)
NEUTROS PCT: 71 %
Platelets: 139 10*3/uL — ABNORMAL LOW (ref 150–440)
RBC: 4.98 MIL/uL (ref 4.40–5.90)
RDW: 17.6 % — ABNORMAL HIGH (ref 11.5–14.5)
WBC: 3.4 10*3/uL — AB (ref 3.8–10.6)

## 2017-10-14 LAB — COMPREHENSIVE METABOLIC PANEL
ALK PHOS: 101 U/L (ref 38–126)
ALT: 23 U/L (ref 0–44)
ANION GAP: 8 (ref 5–15)
AST: 36 U/L (ref 15–41)
Albumin: 4.2 g/dL (ref 3.5–5.0)
BUN: 14 mg/dL (ref 8–23)
CALCIUM: 9.1 mg/dL (ref 8.9–10.3)
CO2: 24 mmol/L (ref 22–32)
CREATININE: 0.76 mg/dL (ref 0.61–1.24)
Chloride: 108 mmol/L (ref 98–111)
Glucose, Bld: 128 mg/dL — ABNORMAL HIGH (ref 70–99)
Potassium: 3.7 mmol/L (ref 3.5–5.1)
Sodium: 140 mmol/L (ref 135–145)
TOTAL PROTEIN: 7.1 g/dL (ref 6.5–8.1)
Total Bilirubin: 1.5 mg/dL — ABNORMAL HIGH (ref 0.3–1.2)

## 2017-10-14 MED ORDER — FAMOTIDINE IN NACL 20-0.9 MG/50ML-% IV SOLN
20.0000 mg | Freq: Two times a day (BID) | INTRAVENOUS | Status: DC
  Administered 2017-10-14: 20 mg via INTRAVENOUS
  Filled 2017-10-14: qty 50

## 2017-10-14 MED ORDER — SODIUM CHLORIDE 0.9 % IV SOLN
20.0000 mg | Freq: Once | INTRAVENOUS | Status: AC
  Administered 2017-10-14: 20 mg via INTRAVENOUS
  Filled 2017-10-14: qty 2

## 2017-10-14 MED ORDER — SODIUM CHLORIDE 0.9 % IV SOLN
1000.0000 mg | Freq: Once | INTRAVENOUS | Status: AC
  Administered 2017-10-14: 1000 mg via INTRAVENOUS
  Filled 2017-10-14: qty 40

## 2017-10-14 MED ORDER — DIPHENHYDRAMINE HCL 50 MG/ML IJ SOLN
50.0000 mg | Freq: Once | INTRAMUSCULAR | Status: AC
  Administered 2017-10-14: 50 mg via INTRAVENOUS
  Filled 2017-10-14: qty 1

## 2017-10-14 MED ORDER — ACETAMINOPHEN 325 MG PO TABS
650.0000 mg | ORAL_TABLET | Freq: Once | ORAL | Status: AC
  Administered 2017-10-14: 650 mg via ORAL
  Filled 2017-10-14: qty 2

## 2017-10-14 MED ORDER — SODIUM CHLORIDE 0.9 % IV SOLN
Freq: Once | INTRAVENOUS | Status: AC
  Administered 2017-10-14: 09:00:00 via INTRAVENOUS
  Filled 2017-10-14: qty 250

## 2017-10-14 NOTE — Progress Notes (Signed)
Hematology/Oncology Follow up note [facility] Telephone:(336) [phone] Fax:(336) [phone]   Patient Care Team: [name], MD as PCP - General (Internal Medicine) Earlie Server, MD as Medical Oncologist (Medical Oncology)  REFERRING PROVIDER: [name], MD  REASON FOR VISIT Follow up for management of CLL HISTORY OF PRESENTING ILLNESS:  Martin Adkins is a  72 y.o.  male with PMH listed below who was referred to me for evaluation of lymphocytosis.  Recent lab work on 03/05/2017 showed wbc 11.4, hb 14.6, platelet count 117,000, lymphocytosis 63.5%, neutrophil 22%, Report chronic fatigue, no weight loss, fever or chills.  He reports feeling chest "rattleness". Has for CT chest which showed acute finding, chronic finding includes  postsurgical changes consistent with coronary bypass grafting. One of the bypass grafts arising from the aorta shows apparent thrombosis and an area of distal aneurysmal dilatation which is also Thrombosed. Right adrenal adenoma stable in appearance.Mild scarring in the lung bases.Fatty liver.   Takes Aspirin 71m, coumadine, plavix. He is on anticoagulation for previous history of clots.  Denies any bleeding events. Use to drink hard liquir daily, quitted for 4-5 years. Current drinks wine on weekend.    Image Studies # 04/04/2017  [place] showed 1.  Splenomegaly.  No focal splenic lesions evident.2. Increased liver echogenicity, a finding most likely indicative of hepatic steatosis. While no focal liver lesions are evident on this study, it must be cautioned that the sensitivity of ultrasound for detection of focal liver lesions is diminished in this circumstance.3. Pancreas obscured by gas. Portions of inferior vena cava obscured by gas.4.  Gallbladder absent.5. Status post abdominal aortic aneurysm repair. No periaortic fluid.  # 04/21/2017 PET scan 1. Moderate splenomegaly with uniform splenic hypermetabolism,compatible  with the provided history of lymphoproliferative disease.No additional hypermetabolic sites of lymphoproliferative disease. Specifically, no hypermetabolic lymphadenopathy Hepatic steatosis, aortic atherosclerosis. Aneurysm.    # CLL, stage IV disease given spelencemagly, thrombocytopenia, symptomatic with fatigue and weight loss.  CLL IPL score 4, high risk.   # status post abdominal aortic aneurysm repair on 09/25/2017   INTERVAL HISTORY Martin JSALAH NAKAMURAis a 72y.o. male who has above presents for chemotherapy and immunotherapy for treatment of CLL.   He is on Venetoclax 400 mg daily.  Feels well.   Reports that he had mild chest congestion for about a week, cough some phlegm.  Otherwise no new complaints. Fatigue has improved since he started CLL treatment.  At baseline.   Review of Systems  Constitutional: Negative for chills, fever and weight loss.  HENT: Negative for congestion, ear discharge, ear pain, nosebleeds, sinus pain and sore throat.   Eyes: Negative for double vision, photophobia, pain, discharge and redness.  Respiratory: Negative for cough, hemoptysis, sputum production, shortness of breath and wheezing.   Cardiovascular: Negative for chest pain, palpitations, orthopnea, claudication and leg swelling.  Gastrointestinal: Negative for abdominal pain, blood in stool, constipation, diarrhea, heartburn, melena, nausea and vomiting.  Genitourinary: Negative for dysuria, flank pain, frequency, hematuria and urgency.  Musculoskeletal: Negative for back pain, myalgias and neck pain.  Skin: Negative for itching and rash.  Neurological: Negative for dizziness, tingling, tremors, speech change, focal weakness, weakness and headaches.  Endo/Heme/Allergies: Negative for environmental allergies. Bruises/bleeds easily.  Psychiatric/Behavioral: Negative for depression, hallucinations and suicidal ideas. The patient is not nervous/anxious.     MEDICAL HISTORY:  Past Medical History:   Diagnosis Date  . AAA (abdominal aortic aneurysm) without rupture ([place] 04/05/2014  . AAA (  abdominal aortic aneurysm) without rupture ([place]) 04/05/2014  . Abdominal aortic aneurysm without rupture ([place])   . Acute non-ST elevation myocardial infarction (NSTEMI) ([place]) 08/15/2014  . 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/28/2014  . APS (antiphospholipid syndrome) ([place])   . Arteriosclerosis of coronary artery 04/05/2014   Overview:  Sp cabg with lima to lad svg to d1 om1 rca 2004 with occluded svg to rca and d1 and pci stetn of om1 graft 2015   . Arthritis   . B12 deficiency 03/21/2017  . Barrett's esophagus   . Benign essential HTN 05/02/2014  . BPH (benign prostatic hyperplasia)   . CAD (coronary artery disease)   . CHF (congestive heart failure) ([place])   . Chronic systolic heart failure ([place]) 04/19/2014   Overview:  With segmental LV systolic dysfunction ejection fraction of 35%   . CLL (chronic lymphocytic leukemia) ([place])   . CLL (chronic lymphocytic leukemia) ([place]) 05/24/2017  . DVT (deep venous thrombosis) ([place])   . Dysrhythmia    ATRIAL FIB.  Marland Kitchen GERD (gastroesophageal reflux disease)   . History of hiatal hernia   . History of shingles 2013  . Hyperlipemia   . Hyperplastic polyps of stomach   . Hypertension   . Myocardial infarction ([place]) 07/30/2014  . NSTEMI (non-ST elevated myocardial infarction) ([place])   . OSA (obstructive sleep apnea)   . Osteoarthritis   . Pre-diabetes   . PVD (peripheral vascular disease) ([place])   . RA (rheumatoid arthritis) ([place])   . SOB (shortness of breath)     SURGICAL HISTORY: Past Surgical History:  Procedure Laterality Date  . ABDOMINAL AORTA STENT    . CHOLECYSTECTOMY    . COLONOSCOPY  11/24/2009  . CORONARY ANGIOPLASTY WITH STENT PLACEMENT  2015  . CORONARY ARTERY BYPASS GRAFT    . ENDOVASCULAR REPAIR/STENT GRAFT N/A 09/24/2017   Procedure: ENDOVASCULAR REPAIR/STENT GRAFT;  Surgeon: Algernon Huxley, MD;  Location: [facility];  Service:  Cardiovascular;  Laterality: N/A;  . ESOPHAGOGASTRODUODENOSCOPY  05/26/02  03/23/12  . ESOPHAGOGASTRODUODENOSCOPY (EGD) WITH PROPOFOL N/A 10/28/2016   Procedure: ESOPHAGOGASTRODUODENOSCOPY (EGD) WITH PROPOFOL;  Surgeon: Manya Silvas, MD;  Location: [facility] ENDOSCOPY;  Service: Endoscopy;  Laterality: N/A;  . PERIPHERAL VASCULAR CATHETERIZATION N/A 09/06/2014   Procedure: IVC Filter Removal;  Surgeon: Katha Cabal, MD;  Location: [facility];  Service: Cardiovascular;  Laterality: N/A;  . TRANSURETHRAL RESECTION OF PROSTATE N/A 02/20/2015   Procedure: TRANSURETHRAL RESECTION OF THE PROSTATE (TURP);  Surgeon: Hollice Espy, MD;  Location: [facility] OR[facility];  Service: Urology;  Laterality: N/A;    SOCIAL HISTORY: Social History   Socioeconomic History  . Marital status: Legally Separated    Spouse name: Not on file  . Number of children: Not on file  . Years of education: Not on file  . Highest education level: Not on file  Occupational History  . Not on file  Social Needs  . Financial resource strain: Not hard at all  . Food insecurity:    Worry: Never true    Inability: Never true  . Transportation needs:    Medical: No    Non-medical: No  Tobacco Use  . Smoking status: Current Some Day Smoker    Packs/day: 0.25    Years: 20.00    Pack years: 5.00    Types: Cigarettes  . Smokeless tobacco: Never Used  . Tobacco comment: per pt nicotine patch number85m -smokes 1-3 cig/per d now  Substance and Sexual Activity  . Alcohol use: Yes  Alcohol/week: 0.0 standard drinks    Comment: social on weekends  . Drug use: No  . Sexual activity: Never  Lifestyle  . Physical activity:    Days per week: 7 days    Minutes per session: 40 min  . Stress: Not at all  Relationships  . Social connections:    Talks on phone: More than three times a week    Gets together: More than three times a week    Attends religious service: More than 4 times per year    Active member of club or  organization: No    Attends meetings of clubs or organizations: Never    Relationship status: Separated  . Intimate partner violence:    Fear of current or ex partner: No    Emotionally abused: No    Physically abused: No    Forced sexual activity: No  Other Topics Concern  . Not on file  Social History Narrative  . Not on file    FAMILY HISTORY: Family History  Problem Relation Age of Onset  . Cancer Mother 39       breast ca  . Diabetes Mother   . Coronary artery disease Father   . Heart attack Father   . Prostate cancer Brother   . Bladder Cancer Neg Hx   . Kidney cancer Neg Hx     ALLERGIES:  is allergic to ace inhibitors; pravastatin; rosuvastatin; simvastatin; amoxicillin; niacin; and penicillins.  MEDICATIONS:  Current Outpatient Medications  Medication Sig Dispense Refill  . albuterol (PROVENTIL HFA;VENTOLIN HFA) 108 (90 Base) MCG/ACT inhaler Inhale 2 puffs into the lungs every 6 (six) hours as needed for wheezing or shortness of breath. 1 Inhaler 0  . allopurinol (ZYLOPRIM) 300 MG tablet Take 1 tablet (300 mg total) by mouth daily. 30 tablet 0  . amLODipine (NORVASC) 10 MG tablet Take 10 mg by mouth daily.     Marland Kitchen aspirin EC 81 MG tablet Take 81 mg by mouth daily.    Marland Kitchen atorvastatin (LIPITOR) 80 MG tablet TAKE 1 TABLET (80 MG TOTAL) BY MOUTH ONCE DAILY.  11  . fluticasone (FLONASE) 50 MCG/ACT nasal spray Place 2 sprays into both nostrils daily.     . furosemide (LASIX) 20 MG tablet Take 20 mg by mouth daily.  11  . gabapentin (NEURONTIN) 300 MG capsule Take 1 capsule (300 mg total) by mouth 3 (three) times daily. 90 capsule 0  . hydroxychloroquine (PLAQUENIL) 200 MG tablet Take 1 tablet by mouth daily.    . Ipratropium-Albuterol (COMBIVENT) 20-100 MCG/ACT AERS respimat Inhale 1 puff into the lungs every 6 (six) hours. 1 Inhaler 11  . isosorbide mononitrate (IMDUR) 30 MG 24 hr tablet Take 30 mg by mouth daily.     Marland Kitchen losartan (COZAAR) 100 MG tablet 100 mg daily.     Phillis Knack EYE DROPS 0.4-0.3 % SOLN Apply 1 drop to eye daily as needed.     . metoprolol succinate (TOPROL-XL) 100 MG 24 hr tablet Take 100 mg by mouth daily.     . montelukast (SINGULAIR) 10 MG tablet Take 1 tablet (10 mg total) by mouth as needed. Take before Gazyva infusion. 20 tablet 0  . pantoprazole (PROTONIX) 40 MG tablet Take 40 mg by mouth daily.     . potassium chloride SA (K-DUR,KLOR-CON) 20 MEQ tablet Take 20 mEq by mouth daily.     Marland Kitchen venetoclax 100 MG TABS Take 400 mg by mouth daily. Take with food and a full  glass of water. 120 tablet 2  . vitamin B-12 (CYANOCOBALAMIN) 100 MCG tablet Take 1 tablet (100 mcg total) by mouth daily. 90 tablet 1  . warfarin (COUMADIN) 1 MG tablet Take by mouth daily. Patient takes once a week with 56m tablet for a total of 6 mg per day    . warfarin (COUMADIN) 5 MG tablet TAKE 1 TABLET (5 MG TOTAL) BY MOUTH ONCE DAILY.  5   No current facility-administered medications for this visit.      PHYSICAL EXAMINATION: ECOG 1 Vitals:   10/14/17 0945  BP: 131/85  Pulse: 78  Resp: 16  Temp: (!) 97.2 F (36.2 C)  SpO2: 98%  Physical Exam  Constitutional: He is oriented to person, place, and time. No distress.  HENT:  Head: Normocephalic and atraumatic.  Nose: Nose normal.  Mouth/Throat: Oropharynx is clear and moist. No oropharyngeal exudate.  Eyes: Pupils are equal, round, and reactive to light. EOM are normal. Left eye exhibits no discharge. No scleral icterus.  Neck: Normal range of motion. Neck supple. No JVD present.  Cardiovascular: Normal rate, regular rhythm and normal heart sounds.  No murmur heard. Pulmonary/Chest: Effort normal. No respiratory distress. He has no wheezes. He has no rales. He exhibits no tenderness.  Abdominal: Soft. He exhibits no distension and no mass. There is no tenderness.  Musculoskeletal: Normal range of motion. He exhibits no edema, tenderness or deformity.  Lymphadenopathy:    He has no cervical adenopathy.    Neurological: He is alert and oriented to person, place, and time. No cranial nerve deficit. He exhibits normal muscle tone. Coordination normal.  Skin: Skin is warm and dry. No rash noted. He is not diaphoretic. No erythema.  Psychiatric: Affect and judgment normal.  Nursing note and vitals reviewed.    LABORATORY DATA:  I have reviewed the data as listed Lab Results  Component Value Date   WBC 3.1 (L) 09/30/2017   HGB 14.7 09/30/2017   HCT 42.0 09/30/2017   MCV 87.2 09/30/2017   PLT 206 09/30/2017   Recent Labs    04/09/17 1155  09/22/17 0843 09/25/17 0521 09/30/17 0943 10/14/17 0805  NA  --    < > 137 139 137 140  K  --    < > 3.8 4.4 3.8 3.7  CL  --    < > 106 107 105 108  CO2  --    < > '22 26 23 24  ' GLUCOSE  --    < > 142* 114* 109* 128*  BUN  --    < > '20 12 17 14  ' CREATININE  --    < > 0.89 0.71 0.85 0.76  CALCIUM  --    < > 9.0 8.9 9.0 9.1  GFRNONAA  --    < > >60 >60 >60 >60  GFRAA  --    < > >60 >60 >60 >60  PROT  --    < > 7.5  --  7.5 7.1  ALBUMIN  --    < > 4.4  --  4.2 4.2  AST  --    < > 39  --  29 36  ALT  --    < > 39  --  25 23  ALKPHOS  --    < > 89  --  102 101  BILITOT 1.7*   < > 2.1*  --  1.6* 1.5*  BILIDIR 0.3  --   --   --   --   --    < > =  values in this interval not displayed.   Hepatitis panel negative.  LDH 228, mildly elevated Beta 2 microglobulin normal.   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 The morphologic and immunophenotypic features are most consistent with chronic lymphocytic leukemia/small lymphocytic lymphoma. Bone marrow Cytogenetics : normal.  Peripheral blood FISH panel negative for CCND1- IGH mutation, ATM, 12, 13q, Tp53 mutation.   RADIOGRAPHIC STUDIES: I have personally reviewed the radiological images as listed and agreed with the findings in the report. CT angio abd/pelvis showed Type 1b endoleak, AAA aneurysm, Spleen has decreased in size since PET scan in  April 2019.   ASSESSMENT & PLAN:  72 yo male presents for immunotherapy treatment of Stage IV CLL. 1. Long term current use of anticoagulant therapy   2. CLL (chronic lymphocytic leukemia) ([place])   3. Thrombocytopenia ([place])   4. Encounter for antineoplastic immunotherapy   5. Encounter for antineoplastic chemotherapy   6. Lymphopenia    # CLL/thrombocytopenia Labs reviewed and discussed with patient.  Venetoclax dose 400 mg daily.  Tolerating well with mild thrombocytopenia. Advised patient to continue Venetoclax 400 mg daily. Proceed cycle 6 Gazyva today. Advised patient to stop allopurinol.  Monitor uric acid at next visit.  #Thrombocytopenia, grade 1 due to Venetoclax.  No active bleeding.  Continue to monitor. #Lymphopenia, chronic, stable.  Continue to monitor. #Chronic anticoagulation.  Follow-up with cardiology and primary care physician for Coumadin dosing. We spent sufficient time to discuss many aspect of care, questions were answered to patient's satisfaction.  The patient knows to call the clinic with any problems questions or concerns.  Return of visit: 4 weeks Total face to face encounter time for this patient visit was 25 min. >50% of the time was  spent in counseling and coordination of care.    Earlie Server, MD, PhD Hematology Oncology [facility] Pager- [phone] 10/14/2017

## 2017-10-14 NOTE — Progress Notes (Signed)
Patient here today for follow up and chemotherapy.  Patient states no new concerns today  

## 2017-10-23 ENCOUNTER — Telehealth: Payer: Self-pay | Admitting: Pharmacist

## 2017-10-23 NOTE — Telephone Encounter (Signed)
Oral Chemotherapy Pharmacist Encounter  Follow-Up Form  Called patient today to follow up regarding patient's oral chemotherapy medication: Venetoclax  Original Start date of oral chemotherapy: 08/2017  Pt reports 0 tablets/doses of venetoclax missed in the last month.   Pt reports the following side effects: None reported  Recent labs reviewed: CBC from 10/14/17  New medications?: None reported  Other Issues: None reported  Patient knows to call the office with questions or concerns. Oral Oncology Clinic will continue to follow.  Darl Pikes, PharmD, BCPS, [facility] Hematology/Oncology Clinical Pharmacist [facility] [phone]  10/23/2017 2:54 PM

## 2017-10-24 ENCOUNTER — Other Ambulatory Visit: Payer: Self-pay | Admitting: *Deleted

## 2017-10-24 MED ORDER — VITAMIN B-12 100 MCG PO TABS: 100.0000 ug | ORAL_TABLET | Freq: Every day | ORAL | 1 refills | Status: DC

## 2017-11-04 ENCOUNTER — Encounter (INDEPENDENT_AMBULATORY_CARE_PROVIDER_SITE_OTHER): Payer: Self-pay | Admitting: Vascular Surgery

## 2017-11-04 ENCOUNTER — Ambulatory Visit (INDEPENDENT_AMBULATORY_CARE_PROVIDER_SITE_OTHER): Payer: Medicare PPO | Admitting: Vascular Surgery

## 2017-11-04 ENCOUNTER — Ambulatory Visit (INDEPENDENT_AMBULATORY_CARE_PROVIDER_SITE_OTHER): Payer: Medicare PPO

## 2017-11-04 VITALS — BP 137/84 | HR 75 | Resp 16 | Ht 72.0 in | Wt 208.0 lb

## 2017-11-04 DIAGNOSIS — I714 Abdominal aortic aneurysm, without rupture, unspecified: Secondary | ICD-10-CM

## 2017-11-04 DIAGNOSIS — I713 Abdominal aortic aneurysm, ruptured, unspecified: Secondary | ICD-10-CM

## 2017-11-04 DIAGNOSIS — I739 Peripheral vascular disease, unspecified: Secondary | ICD-10-CM

## 2017-11-04 NOTE — Progress Notes (Signed)
Subjective:    Patient ID: Martin Adkins, male    DOB: February 02, 1945, 72 y.o.   MRN: [id number] Chief Complaint  Patient presents with  . Follow-up    ultrasound follow up   Patient presents for his 1 month post operative follow-up.  The patient is status post a revision of his EVAR (07/09/11) on September 24, 2017 for an endoleak.  The patient presents today without complaint.  The patient's post operative course has been unremarkable.  The patient denies any abdominal pain, back pain or thrombosis to the bilateral lower extremity.  Patient denies any fever, nausea vomiting.  The patient's right groin access site has healed.  Patient denies any claudication-like symptoms, rest pain or ulcer formation to the bilateral lower extremity.  The patient underwent a abdominal aortic duplex which was notable for patent endovascular aneurysm repair with no evidence of endoleak.  Slight increase in aortic diameter when compared to the prior exam.  Previous diameter measurement on August 22, 2017 was 5.1 cm.  Today's measurement was 5.9 cm x 5.5 cm.  Review of Systems  Constitutional: Negative.   HENT: Negative.   Eyes: Negative.   Respiratory: Negative.   Cardiovascular: Negative.   Gastrointestinal: Negative.   Endocrine: Negative.   Genitourinary: Negative.   Musculoskeletal: Negative.   Skin: Negative.   Allergic/Immunologic: Negative.   Neurological: Negative.   Hematological: Negative.   Psychiatric/Behavioral: Negative.       Objective:   Physical Exam  Constitutional: He is oriented to person, place, and time. He appears well-developed and well-nourished. No distress.  HENT:  Head: Normocephalic and atraumatic.  Right Ear: External ear normal.  Left Ear: External ear normal.  Eyes: Pupils are equal, round, and reactive to light. Conjunctivae and EOM are normal.  Neck: Normal range of motion.  Cardiovascular: Normal rate, regular rhythm, normal heart sounds and intact distal pulses.   Pulses:      Radial pulses are 2+ on the right side, and 2+ on the left side.  To palpate pedal pulses however the bilateral feet are warm and there is a good capillary refill.  Pulmonary/Chest: Effort normal and breath sounds normal.  Abdominal: Soft. Bowel sounds are normal. He exhibits no distension. There is no tenderness. There is no guarding.  Musculoskeletal: Normal range of motion. He exhibits edema (Bilateral nonpitting edema noted).  Neurological: He is alert and oriented to person, place, and time.  Skin: Skin is warm and dry. He is not diaphoretic.  Psychiatric: He has a normal mood and affect. His behavior is normal. Judgment and thought content normal.  Vitals reviewed.  BP 137/84 (BP Location: Right Arm)   Pulse 75   Resp 16   Ht 6' (1.829 m)   Wt 208 lb (94.3 kg)   BMI 28.21 kg/m   Past Medical History:  Diagnosis Date  . AAA (abdominal aortic aneurysm) without rupture ([place]) 04/05/2014  . AAA (abdominal aortic aneurysm) without rupture ([place]) 04/05/2014  . Abdominal aortic aneurysm without rupture ([place])   . Acute non-ST elevation myocardial infarction (NSTEMI) ([place]) 08/15/2014  . 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/28/2014  . APS (antiphospholipid syndrome) ([place])   . Arteriosclerosis of coronary artery 04/05/2014   Overview:  Sp cabg with lima to lad svg to d1 om1 rca 2004 with occluded svg to rca and d1 and pci stetn of om1 graft 2015   . Arthritis   . B12 deficiency 03/21/2017  . Barrett's esophagus   . Benign essential HTN 05/02/2014  .  BPH (benign prostatic hyperplasia)   . CAD (coronary artery disease)   . CHF (congestive heart failure) ([place])   . Chronic systolic heart failure ([place]) 04/19/2014   Overview:  With segmental LV systolic dysfunction ejection fraction of 35%   . CLL (chronic lymphocytic leukemia) ([place])   . CLL (chronic lymphocytic leukemia) ([place]) 05/24/2017  . DVT (deep venous thrombosis) ([place])   . Dysrhythmia    ATRIAL FIB.  Marland Kitchen GERD (gastroesophageal  reflux disease)   . History of hiatal hernia   . History of shingles 2013  . Hyperlipemia   . Hyperplastic polyps of stomach   . Hypertension   . Myocardial infarction ([place]) 07/30/2014  . NSTEMI (non-ST elevated myocardial infarction) ([place])   . OSA (obstructive sleep apnea)   . Osteoarthritis   . Pre-diabetes   . PVD (peripheral vascular disease) ([place])   . RA (rheumatoid arthritis) ([place])   . SOB (shortness of breath)    Social History   Socioeconomic History  . Marital status: Legally Separated    Spouse name: Not on file  . Number of children: Not on file  . Years of education: Not on file  . Highest education level: Not on file  Occupational History  . Not on file  Social Needs  . Financial resource strain: Not hard at all  . Food insecurity:    Worry: Never true    Inability: Never true  . Transportation needs:    Medical: No    Non-medical: No  Tobacco Use  . Smoking status: Current Some Day Smoker    Packs/day: 0.25    Years: 20.00    Pack years: 5.00    Types: Cigarettes  . Smokeless tobacco: Never Used  . Tobacco comment: per pt nicotine patch number7mg  -smokes 1-3 cig/per d now  Substance and Sexual Activity  . Alcohol use: Yes    Alcohol/week: 0.0 standard drinks    Comment: social on weekends  . Drug use: No  . Sexual activity: Never  Lifestyle  . Physical activity:    Days per week: 7 days    Minutes per session: 40 min  . Stress: Not at all  Relationships  . Social connections:    Talks on phone: More than three times a week    Gets together: More than three times a week    Attends religious service: More than 4 times per year    Active member of club or organization: No    Attends meetings of clubs or organizations: Never    Relationship status: Separated  . Intimate partner violence:    Fear of current or ex partner: No    Emotionally abused: No    Physically abused: No    Forced sexual activity: No  Other Topics Concern  . Not on file    Social History Narrative  . Not on file   Past Surgical History:  Procedure Laterality Date  . ABDOMINAL AORTA STENT    . CHOLECYSTECTOMY    . COLONOSCOPY  11/24/2009  . CORONARY ANGIOPLASTY WITH STENT PLACEMENT  2015  . CORONARY ARTERY BYPASS GRAFT    . ENDOVASCULAR REPAIR/STENT GRAFT N/A 09/24/2017   Procedure: ENDOVASCULAR REPAIR/STENT GRAFT;  Surgeon: Algernon Huxley, MD;  Location: [facility];  Service: Cardiovascular;  Laterality: N/A;  . ESOPHAGOGASTRODUODENOSCOPY  05/26/02  03/23/12  . ESOPHAGOGASTRODUODENOSCOPY (EGD) WITH PROPOFOL N/A 10/28/2016   Procedure: ESOPHAGOGASTRODUODENOSCOPY (EGD) WITH PROPOFOL;  Surgeon: Manya Silvas, MD;  Location: [facility];  Service: Endoscopy;  Laterality: N/A;  . PERIPHERAL VASCULAR CATHETERIZATION N/A 09/06/2014   Procedure: IVC Filter Removal;  Surgeon: Katha Cabal, MD;  Location: [facility];  Service: Cardiovascular;  Laterality: N/A;  . TRANSURETHRAL RESECTION OF PROSTATE N/A 02/20/2015   Procedure: TRANSURETHRAL RESECTION OF THE PROSTATE (TURP);  Surgeon: Hollice Espy, MD;  Location: [facility];  Service: Urology;  Laterality: N/A;   Family History  Problem Relation Age of Onset  . Cancer Mother 57       breast ca  . Diabetes Mother   . Coronary artery disease Father   . Heart attack Father   . Prostate cancer Brother   . Bladder Cancer Neg Hx   . Kidney cancer Neg Hx    Allergies  Allergen Reactions  . Ace Inhibitors Other (See Comments)    Other reaction(s): Cough  . Pravastatin Other (See Comments)    Other reaction(s): Muscle Pain  . Rosuvastatin Other (See Comments)    Other reaction(s): Other (See Comments) GI bleed  . Simvastatin Other (See Comments)    Other reaction(s): Muscle Pain  . Amoxicillin Rash  . Niacin Rash  . Penicillins Rash    Has patient had a PCN reaction causing immediate rash, facial/tongue/throat swelling, SOB or lightheadedness with hypotension: Yes Has patient had a  PCN reaction causing severe rash involving mucus membranes or skin necrosis: Unknown Has patient had a PCN reaction that required hospitalization: Unknown Has patient had a PCN reaction occurring within the last 10 years: No If all of the above answers are "NO", then may proceed with Cephalosporin use.      Assessment & Plan:  Patient presents for his 1 month post operative follow-up.  The patient is status post a revision of his EVAR (07/09/11) on September 24, 2017 for an endoleak.  The patient presents today without complaint.  The patient's post operative course has been unremarkable.  The patient denies any abdominal pain, back pain or thrombosis to the bilateral lower extremity.  Patient denies any fever, nausea vomiting.  The patient's right groin access site has healed.  Patient denies any claudication-like symptoms, rest pain or ulcer formation to the bilateral lower extremity.  The patient underwent a abdominal aortic duplex which was notable for patent endovascular aneurysm repair with no evidence of endoleak.  Slight increase in aortic diameter when compared to the prior exam.  Previous diameter measurement on August 22, 2017 was 5.1 cm.  Today's measurement was 5.9 cm x 5.5 cm.  1. Leaking abdominal aortic aneurysm (AAA) ([place]) s/p repair on 09/24/17 - Stable Patient presents today with a patent endovascular aortic aneurysm repair revision with no endoleak. Patient presents without symptom Physical exam is unremarkable Slight increase in aneurysmal sac size We will bring the patient back in 3 months for a repeat EVAR and ABI The patient's blood pressure is being adequately controlled however I have reviewed the importance of hypertension and lipid control and the importance of continuing his abstinence from tobacco.  The patient is also encouraged to exercise a minimum of 30 minutes 4 times a week.  Should the patient develop new onset abdominal or back pain or signs of peripheral  embolization they are instructed to seek medical attention immediately and to alert the physician providing care that they have an aneurysm.  The patient voices their understanding.  - VAS [place] EVAR DUPLEX; Future  2. AAA (abdominal aortic aneurysm) without rupture ([facility]) - Stable As Above  - VAS [place] EVAR  DUPLEX; Future  3. Peripheral vascular disease ([place]) - Stable The patient presents today asymptomatically Patient exam is unremarkable and stable The patient undergo a surveillance ABI during his 69-month EVAR follow-up  - VAS [place] ABI WITH/WO TBI; Future  Current Outpatient Medications on File Prior to Visit  Medication Sig Dispense Refill  . albuterol (PROVENTIL HFA;VENTOLIN HFA) 108 (90 Base) MCG/ACT inhaler Inhale 2 puffs into the lungs every 6 (six) hours as needed for wheezing or shortness of breath. 1 Inhaler 0  . allopurinol (ZYLOPRIM) 300 MG tablet Take 1 tablet (300 mg total) by mouth daily. 30 tablet 0  . amLODipine (NORVASC) 10 MG tablet Take 10 mg by mouth daily.     Marland Kitchen aspirin EC 81 MG tablet Take 81 mg by mouth daily.    Marland Kitchen atorvastatin (LIPITOR) 80 MG tablet TAKE 1 TABLET (80 MG TOTAL) BY MOUTH ONCE DAILY.  11  . fluticasone (FLONASE) 50 MCG/ACT nasal spray Place 2 sprays into both nostrils daily.     . furosemide (LASIX) 20 MG tablet Take 20 mg by mouth daily.  11  . gabapentin (NEURONTIN) 300 MG capsule Take 1 capsule (300 mg total) by mouth 3 (three) times daily. 90 capsule 0  . hydroxychloroquine (PLAQUENIL) 200 MG tablet Take 1 tablet by mouth daily.    . Ipratropium-Albuterol (COMBIVENT) 20-100 MCG/ACT AERS respimat Inhale 1 puff into the lungs every 6 (six) hours. 1 Inhaler 11  . isosorbide mononitrate (IMDUR) 30 MG 24 hr tablet Take 30 mg by mouth daily.     Marland Kitchen losartan (COZAAR) 100 MG tablet 100 mg daily.     Phillis Knack EYE DROPS 0.4-0.3 % SOLN Apply 1 drop to eye daily as needed.     . metoprolol succinate (TOPROL-XL) 100 MG 24 hr tablet Take 100 mg by mouth daily.      . montelukast (SINGULAIR) 10 MG tablet Take 1 tablet (10 mg total) by mouth as needed. Take before Gazyva infusion. 20 tablet 0  . pantoprazole (PROTONIX) 40 MG tablet Take 40 mg by mouth daily.     . potassium chloride SA (K-DUR,KLOR-CON) 20 MEQ tablet Take 20 mEq by mouth daily.     Marland Kitchen venetoclax 100 MG TABS Take 400 mg by mouth daily. Take with food and a full glass of water. 120 tablet 2  . vitamin B-12 (CYANOCOBALAMIN) 100 MCG tablet Take 1 tablet (100 mcg total) by mouth daily. 90 tablet 1  . warfarin (COUMADIN) 1 MG tablet Take by mouth daily. Patient takes once a week with 5mg  tablet for a total of 6 mg per day    . warfarin (COUMADIN) 5 MG tablet TAKE 1 TABLET (5 MG TOTAL) BY MOUTH ONCE DAILY.  5   No current facility-administered medications on file prior to visit.    There are no Patient Instructions on file for this visit. No follow-ups on file.  Azriella Mattia A Anola Mcgough, PA-C

## 2017-11-11 ENCOUNTER — Encounter: Payer: Self-pay | Admitting: Oncology

## 2017-11-11 ENCOUNTER — Inpatient Hospital Stay: Payer: Medicare PPO | Attending: Oncology

## 2017-11-11 ENCOUNTER — Inpatient Hospital Stay (HOSPITAL_BASED_OUTPATIENT_CLINIC_OR_DEPARTMENT_OTHER): Payer: Medicare PPO | Admitting: Oncology

## 2017-11-11 ENCOUNTER — Other Ambulatory Visit: Payer: Self-pay

## 2017-11-11 VITALS — BP 122/84 | HR 86 | Temp 96.9°F | Resp 18 | Wt 206.6 lb

## 2017-11-11 DIAGNOSIS — Z7982 Long term (current) use of aspirin: Secondary | ICD-10-CM

## 2017-11-11 DIAGNOSIS — C9110 Chronic lymphocytic leukemia of B-cell type not having achieved remission: Secondary | ICD-10-CM

## 2017-11-11 DIAGNOSIS — Z7901 Long term (current) use of anticoagulants: Secondary | ICD-10-CM | POA: Insufficient documentation

## 2017-11-11 DIAGNOSIS — I2510 Atherosclerotic heart disease of native coronary artery without angina pectoris: Secondary | ICD-10-CM

## 2017-11-11 DIAGNOSIS — Z86718 Personal history of other venous thrombosis and embolism: Secondary | ICD-10-CM | POA: Insufficient documentation

## 2017-11-11 DIAGNOSIS — F17210 Nicotine dependence, cigarettes, uncomplicated: Secondary | ICD-10-CM | POA: Diagnosis not present

## 2017-11-11 DIAGNOSIS — D702 Other drug-induced agranulocytosis: Secondary | ICD-10-CM | POA: Insufficient documentation

## 2017-11-11 DIAGNOSIS — R5383 Other fatigue: Secondary | ICD-10-CM

## 2017-11-11 DIAGNOSIS — Z79899 Other long term (current) drug therapy: Secondary | ICD-10-CM

## 2017-11-11 DIAGNOSIS — Z955 Presence of coronary angioplasty implant and graft: Secondary | ICD-10-CM | POA: Insufficient documentation

## 2017-11-11 DIAGNOSIS — D696 Thrombocytopenia, unspecified: Secondary | ICD-10-CM

## 2017-11-11 LAB — CBC WITH DIFFERENTIAL/PLATELET
Abs Immature Granulocytes: 0 10*3/uL (ref 0.00–0.07)
BASOS ABS: 0 10*3/uL (ref 0.0–0.1)
Basophils Relative: 0 %
EOS PCT: 0 %
Eosinophils Absolute: 0 10*3/uL (ref 0.0–0.5)
HEMATOCRIT: 40.8 % (ref 39.0–52.0)
HEMOGLOBIN: 14 g/dL (ref 13.0–17.0)
IMMATURE GRANULOCYTES: 0 %
LYMPHS PCT: 45 %
Lymphs Abs: 0.5 10*3/uL — ABNORMAL LOW (ref 0.7–4.0)
MCH: 30.4 pg (ref 26.0–34.0)
MCHC: 34.3 g/dL (ref 30.0–36.0)
MCV: 88.7 fL (ref 80.0–100.0)
Monocytes Absolute: 0.2 10*3/uL (ref 0.1–1.0)
Monocytes Relative: 16 %
NEUTROS ABS: 0.5 10*3/uL — AB (ref 1.7–7.7)
NRBC: 0 % (ref 0.0–0.2)
Neutrophils Relative %: 39 %
Platelets: 24 10*3/uL — CL (ref 150–400)
RBC: 4.6 MIL/uL (ref 4.22–5.81)
RDW: 16.8 % — ABNORMAL HIGH (ref 11.5–15.5)
WBC: 1.2 10*3/uL — CL (ref 4.0–10.5)

## 2017-11-11 LAB — COMPREHENSIVE METABOLIC PANEL
ALT: 24 U/L (ref 0–44)
AST: 36 U/L (ref 15–41)
Albumin: 4.3 g/dL (ref 3.5–5.0)
Alkaline Phosphatase: 95 U/L (ref 38–126)
Anion gap: 8 (ref 5–15)
BUN: 15 mg/dL (ref 8–23)
CHLORIDE: 103 mmol/L (ref 98–111)
CO2: 27 mmol/L (ref 22–32)
CREATININE: 0.71 mg/dL (ref 0.61–1.24)
Calcium: 9 mg/dL (ref 8.9–10.3)
GFR calc non Af Amer: >60 mL/min (ref >60)
Glucose, Bld: 118 mg/dL — ABNORMAL HIGH (ref 70–99)
Potassium: 3.5 mmol/L (ref 3.5–5.1)
SODIUM: 138 mmol/L (ref 135–145)
Total Bilirubin: 2 mg/dL — ABNORMAL HIGH (ref 0.3–1.2)
Total Protein: 7.2 g/dL (ref 6.5–8.1)

## 2017-11-11 LAB — URIC ACID: URIC ACID, SERUM: 4.5 mg/dL (ref 3.7–8.6)

## 2017-11-11 MED FILL — VENCLEXTA 100 MG TABS: 100 | 30 days supply | Qty: 120 | Fill #2

## 2017-11-11 NOTE — Progress Notes (Signed)
Patient here for follow up. States he received call last week from "a lady in [place]" saying that his venetoclax was going to be mailed. He was supposed to receive it last Wednesday but he has not received it.

## 2017-11-12 ENCOUNTER — Inpatient Hospital Stay: Payer: Medicare PPO

## 2017-11-12 ENCOUNTER — Other Ambulatory Visit: Payer: Self-pay

## 2017-11-12 ENCOUNTER — Telehealth: Payer: Self-pay | Admitting: *Deleted

## 2017-11-12 DIAGNOSIS — C9110 Chronic lymphocytic leukemia of B-cell type not having achieved remission: Secondary | ICD-10-CM

## 2017-11-12 DIAGNOSIS — Z7901 Long term (current) use of anticoagulants: Secondary | ICD-10-CM

## 2017-11-12 LAB — CBC WITH DIFFERENTIAL/PLATELET
ABS IMMATURE GRANULOCYTES: 0.01 10*3/uL (ref 0.00–0.07)
BASOS PCT: 0 %
Basophils Absolute: 0 10*3/uL (ref 0.0–0.1)
EOS PCT: 0 %
Eosinophils Absolute: 0 10*3/uL (ref 0.0–0.5)
HCT: 40.1 % (ref 39.0–52.0)
Hemoglobin: 14.1 g/dL (ref 13.0–17.0)
Immature Granulocytes: 1 %
Lymphocytes Relative: 43 %
Lymphs Abs: 0.7 10*3/uL (ref 0.7–4.0)
MCH: 30.7 pg (ref 26.0–34.0)
MCHC: 35.2 g/dL (ref 30.0–36.0)
MCV: 87.2 fL (ref 80.0–100.0)
MONO ABS: 0.3 10*3/uL (ref 0.1–1.0)
Monocytes Relative: 18 %
Neutro Abs: 0.6 10*3/uL — ABNORMAL LOW (ref 1.7–7.7)
Neutrophils Relative %: 38 %
PLATELETS: 25 10*3/uL — AB (ref 150–400)
RBC: 4.6 MIL/uL (ref 4.22–5.81)
RDW: 16.6 % — AB (ref 11.5–15.5)
WBC: 1.5 10*3/uL — AB (ref 4.0–10.5)
nRBC: 0 % (ref 0.0–0.2)

## 2017-11-12 LAB — COMPREHENSIVE METABOLIC PANEL
ALT: 30 U/L (ref 0–44)
AST: 43 U/L — AB (ref 15–41)
Albumin: 4.4 g/dL (ref 3.5–5.0)
Alkaline Phosphatase: 92 U/L (ref 38–126)
Anion gap: 8 (ref 5–15)
BILIRUBIN TOTAL: 1.5 mg/dL — AB (ref 0.3–1.2)
BUN: 18 mg/dL (ref 8–23)
CALCIUM: 9.2 mg/dL (ref 8.9–10.3)
CO2: 27 mmol/L (ref 22–32)
CREATININE: 0.74 mg/dL (ref 0.61–1.24)
Chloride: 104 mmol/L (ref 98–111)
GFR calc Af Amer: >60 mL/min (ref >60)
Glucose, Bld: 108 mg/dL — ABNORMAL HIGH (ref 70–99)
Potassium: 3.5 mmol/L (ref 3.5–5.1)
Sodium: 139 mmol/L (ref 135–145)
TOTAL PROTEIN: 7.4 g/dL (ref 6.5–8.1)

## 2017-11-12 NOTE — Progress Notes (Signed)
Platelets 25, Per Almyra Free CMA per Dr. Tasia Catchings no platelets needed at this time. Pt educated to call if he develops a fever, bleeding, etc. Pt verbalizes understanding. Pt and VS stable at discharge.

## 2017-11-12 NOTE — Progress Notes (Signed)
Hematology/Oncology Follow up note Martin Adkins [phone] [phone]   Patient Care Team: [name], MD as PCP - General (Internal Medicine) Earlie Server, MD as Medical Oncologist (Medical Oncology)  REFERRING PROVIDER: [name], MD  REASON FOR VISIT Follow up for management of CLL HISTORY OF PRESENTING ILLNESS:  Martin Adkins is a  72 y.o.  male with PMH listed below who was referred to me for evaluation of lymphocytosis.  Recent lab work on 03/05/2017 showed wbc 11.4, hb 14.6, platelet count 117,000, lymphocytosis 63.5%, neutrophil 22%, Report chronic fatigue, no weight loss, fever or chills.  He reports feeling chest "rattleness". Has for CT chest which showed acute finding, chronic finding includes  postsurgical changes consistent with coronary bypass grafting. One of the bypass grafts arising from the aorta shows apparent thrombosis and an area of distal aneurysmal dilatation which is also Thrombosed. Right adrenal adenoma stable in appearance.Mild scarring in the lung bases.Fatty liver.   Takes Aspirin 34m, coumadine, plavix. He is on anticoagulation for previous history of clots.  Denies any bleeding events. Use to drink hard liquir daily, quitted for 4-5 years. Current drinks wine on weekend.    Image Studies # 04/04/2017  [place] showed 1.  Splenomegaly.  No focal splenic lesions evident.2. Increased liver echogenicity, a finding most likely indicative of hepatic steatosis. While no focal liver lesions are evident on this study, it must be cautioned that the sensitivity of ultrasound for detection of focal liver lesions is diminished in this circumstance.3. Pancreas obscured by gas. Portions of inferior vena cava obscured by gas.4.  Gallbladder absent.5. Status post abdominal aortic aneurysm repair. No periaortic fluid.  # 04/21/2017 PET scan 1. Moderate splenomegaly with uniform splenic hypermetabolism,compatible  with the provided history of lymphoproliferative disease.No additional hypermetabolic sites of lymphoproliferative disease. Specifically, no hypermetabolic lymphadenopathy Hepatic steatosis, aortic atherosclerosis. Aneurysm.    # CLL, stage IV disease given spelencemagly, thrombocytopenia, symptomatic with fatigue and weight loss.  CLL IPL score 4, high risk.   # status post abdominal aortic aneurysm repair on 09/25/2017   INTERVAL HISTORY Martin JBRUCE CHURILLAis a 72y.o. male who has above presents for assessment of chemotherapy toxicity for treatment of CLL.   #Patient is on Venetoclax 400 mg daily. Last dose was this morning.  He feels tired recently. #Easy bruising.  Patient is on Coumadin for history of, NSTEMI and CAD No active bleeding.    Review of Systems  Constitutional: Positive for malaise/fatigue. Negative for chills, fever and weight loss.  HENT: Negative for congestion, ear discharge, ear pain, nosebleeds, sinus pain and sore throat.   Eyes: Negative for double vision, photophobia, pain, discharge and redness.  Respiratory: Negative for cough, hemoptysis, sputum production, shortness of breath and wheezing.   Cardiovascular: Negative for chest pain, palpitations, orthopnea, claudication and leg swelling.  Gastrointestinal: Negative for abdominal pain, blood in stool, constipation, diarrhea, heartburn, melena, nausea and vomiting.  Genitourinary: Negative for dysuria, flank pain, frequency, hematuria and urgency.  Musculoskeletal: Negative for back pain, myalgias and neck pain.  Skin: Negative for itching and rash.  Neurological: Negative for dizziness, tingling, tremors, speech change, focal weakness, weakness and headaches.  Endo/Heme/Allergies: Negative for environmental allergies. Bruises/bleeds easily.  Psychiatric/Behavioral: Negative for depression, hallucinations and suicidal ideas. The patient is not nervous/anxious.     MEDICAL HISTORY:  Past Medical History:    Diagnosis Date  . AAA (abdominal aortic aneurysm) [place] 04/05/2014  . AAA (abdominal aortic aneurysm)  without rupture ([place]) 04/05/2014  . Abdominal aortic aneurysm without rupture ([place])   . Acute non-ST elevation myocardial infarction (NSTEMI) ([place]) 08/15/2014  . 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/28/2014  . APS (antiphospholipid syndrome) ([place])   . Arteriosclerosis of coronary artery 04/05/2014   Overview:  Sp cabg with lima to lad svg to d1 om1 rca 2004 with occluded svg to rca and d1 and pci stetn of om1 graft 2015   . Arthritis   . B12 deficiency 03/21/2017  . Barrett's esophagus   . Benign essential HTN 05/02/2014  . BPH (benign prostatic hyperplasia)   . CAD (coronary artery disease)   . CHF (congestive heart failure) ([place])   . Chronic systolic heart failure ([place]) 04/19/2014   Overview:  With segmental LV systolic dysfunction ejection fraction of 35%   . CLL (chronic lymphocytic leukemia) ([place])   . CLL (chronic lymphocytic leukemia) ([place]) 05/24/2017  . DVT (deep venous thrombosis) ([place])   . Dysrhythmia    ATRIAL FIB.  Marland Kitchen GERD (gastroesophageal reflux disease)   . History of hiatal hernia   . History of shingles 2013  . Hyperlipemia   . Hyperplastic polyps of stomach   . Hypertension   . Myocardial infarction ([place]) 07/30/2014  . NSTEMI (non-ST elevated myocardial infarction) ([place])   . OSA (obstructive sleep apnea)   . Osteoarthritis   . Pre-diabetes   . PVD (peripheral vascular disease) ([place])   . RA (rheumatoid arthritis) ([place])   . SOB (shortness of breath)     SURGICAL HISTORY: Past Surgical History:  Procedure Laterality Date  . ABDOMINAL AORTA STENT    . CHOLECYSTECTOMY    . COLONOSCOPY  11/24/2009  . CORONARY ANGIOPLASTY WITH STENT PLACEMENT  2015  . CORONARY ARTERY BYPASS GRAFT    . ENDOVASCULAR REPAIR/STENT GRAFT N/A 09/24/2017   Procedure: ENDOVASCULAR REPAIR/STENT GRAFT;  Surgeon: Algernon Huxley, MD;  Location: [place] [facility];  Service:  Cardiovascular;  Laterality: N/A;  . ESOPHAGOGASTRODUODENOSCOPY  05/26/02  03/23/12  . ESOPHAGOGASTRODUODENOSCOPY (EGD) WITH PROPOFOL N/A 10/28/2016   Procedure: ESOPHAGOGASTRODUODENOSCOPY (EGD) WITH PROPOFOL;  Surgeon: Manya Silvas, MD;  Location: [facility] ENDOSCOPY;  Service: Endoscopy;  Laterality: N/A;  . PERIPHERAL VASCULAR CATHETERIZATION N/A 09/06/2014   Procedure: IVC Filter Removal;  Surgeon: Katha Cabal, MD;  Location: [place] [facility];  Service: Cardiovascular;  Laterality: N/A;  . TRANSURETHRAL RESECTION OF PROSTATE N/A 02/20/2015   Procedure: TRANSURETHRAL RESECTION OF THE PROSTATE (TURP);  Surgeon: Hollice Espy, MD;  Location: [facility];  Service: Urology;  Laterality: N/A;    SOCIAL HISTORY: Social History   Socioeconomic History  . Marital status: Legally Separated    Spouse name: Not on file  . Number of children: Not on file  . Years of education: Not on file  . Highest education level: Not on file  Occupational History  . Not on file  Social Needs  . Financial resource strain: Not hard at all  . Food insecurity:    Worry: Never true    Inability: Never true  . Transportation needs:    Medical: No    Non-medical: No  Tobacco Use  . Smoking status: Current Some Day Smoker    Packs/day: 0.25    Years: 20.00    Pack years: 5.00    Types: Cigarettes  . Smokeless tobacco: Never Used  . Tobacco comment: per pt nicotine patch number29m -smokes 1-3 cig/per d now  Substance and Sexual Activity  . Alcohol use: Yes  Alcohol/week: 0.0 standard drinks    Comment: social on weekends  . Drug use: No  . Sexual activity: Never  Lifestyle  . Physical activity:    Days per week: 7 days    Minutes per session: 40 min  . Stress: Not at all  Relationships  . Social connections:    Talks on phone: More than three times a week    Gets together: More than three times a week    Attends religious service: More than 4 times per year    Active member of club or  organization: No    Attends meetings of clubs or organizations: Never    Relationship status: Separated  . Intimate partner violence:    Fear of current or ex partner: No    Emotionally abused: No    Physically abused: No    Forced sexual activity: No  Other Topics Concern  . Not on file  Social History Narrative  . Not on file    FAMILY HISTORY: Family History  Problem Relation Age of Onset  . Cancer Mother 62       breast ca  . Diabetes Mother   . Coronary artery disease Father   . Heart attack Father   . Prostate cancer Brother   . Bladder Cancer Neg Hx   . Kidney cancer Neg Hx     ALLERGIES:  is allergic to ace inhibitors; pravastatin; rosuvastatin; simvastatin; amoxicillin; niacin; and penicillins.  MEDICATIONS:  Current Outpatient Medications  Medication Sig Dispense Refill  . albuterol (PROVENTIL HFA;VENTOLIN HFA) 108 (90 Base) MCG/ACT inhaler Inhale 2 puffs into the lungs every 6 (six) hours as needed for wheezing or shortness of breath. 1 Inhaler 0  . allopurinol (ZYLOPRIM) 300 MG tablet Take 1 tablet (300 mg total) by mouth daily. 30 tablet 0  . amLODipine (NORVASC) 10 MG tablet Take 10 mg by mouth daily.     Marland Kitchen aspirin EC 81 MG tablet Take 81 mg by mouth daily.    Marland Kitchen atorvastatin (LIPITOR) 80 MG tablet TAKE 1 TABLET (80 MG TOTAL) BY MOUTH ONCE DAILY.  11  . fluticasone (FLONASE) 50 MCG/ACT nasal spray Place 2 sprays into both nostrils daily.     . furosemide (LASIX) 20 MG tablet Take 20 mg by mouth daily.  11  . gabapentin (NEURONTIN) 300 MG capsule Take 1 capsule (300 mg total) by mouth 3 (three) times daily. 90 capsule 0  . hydroxychloroquine (PLAQUENIL) 200 MG tablet Take 1 tablet by mouth daily.    . Ipratropium-Albuterol (COMBIVENT) 20-100 MCG/ACT AERS respimat Inhale 1 puff into the lungs every 6 (six) hours. 1 Inhaler 11  . isosorbide mononitrate (IMDUR) 30 MG 24 hr tablet Take 30 mg by mouth daily.     Marland Kitchen losartan (COZAAR) 100 MG tablet 100 mg daily.     Phillis Knack EYE DROPS 0.4-0.3 % SOLN Apply 1 drop to eye daily as needed.     . metoprolol succinate (TOPROL-XL) 100 MG 24 hr tablet Take 100 mg by mouth daily.     . montelukast (SINGULAIR) 10 MG tablet Take 1 tablet (10 mg total) by mouth as needed. Take before Gazyva infusion. 20 tablet 0  . pantoprazole (PROTONIX) 40 MG tablet Take 40 mg by mouth daily.     . potassium chloride SA (K-DUR,KLOR-CON) 20 MEQ tablet Take 20 mEq by mouth daily.     . vitamin B-12 (CYANOCOBALAMIN) 100 MCG tablet Take 1 tablet (100 mcg total) by mouth daily. [place]  tablet 1  . warfarin (COUMADIN) 5 MG tablet TAKE 1 TABLET (5 MG TOTAL) BY MOUTH ONCE DAILY.  5  . venetoclax 100 MG TABS Take 400 mg by mouth daily. Take with food and a full glass of water. (Patient not taking: Reported on 11/11/2017) 120 tablet 2  . warfarin (COUMADIN) 1 MG tablet Take by mouth daily. Patient takes once a week with 59m tablet for a total of 6 mg per day     No current facility-administered medications for this visit.      PHYSICAL EXAMINATION: ECOG 1 Vitals:   11/11/17 0946  BP: 122/84  Pulse: 86  Resp: 18  Temp: (!) 96.9 F (36.1 C)  Physical Exam  Constitutional: He is oriented to person, place, and time. No distress.  HENT:  Head: Normocephalic and atraumatic.  Nose: Nose normal.  Mouth/Throat: Oropharynx is clear and moist. No oropharyngeal exudate.  Eyes: Pupils are equal, round, and reactive to light. EOM are normal. Left eye exhibits no discharge. No scleral icterus.  Neck: Normal range of motion. Neck supple. No JVD present.  Cardiovascular: Normal rate, regular rhythm and normal heart sounds.  No murmur heard. Pulmonary/Chest: Effort normal. No respiratory distress. He has no wheezes. He has no rales. He exhibits no tenderness.  Abdominal: Soft. He exhibits no distension. There is no tenderness.  Musculoskeletal: Normal range of motion. He exhibits no edema, tenderness or deformity.  Lymphadenopathy:    He has no  cervical adenopathy.  Neurological: He is alert and oriented to person, place, and time. No cranial nerve deficit. He exhibits normal muscle tone. Coordination normal.  Skin: Skin is warm and dry. He is not diaphoretic. No erythema.  Psychiatric: Affect normal.  Nursing note and vitals reviewed.    LABORATORY DATA:  I have reviewed the data as listed Lab Results  Component Value Date   WBC 1.5 (L) 11/12/2017   HGB 14.1 11/12/2017   HCT 40.1 11/12/2017   MCV 87.2 11/12/2017   PLT 25 (LL) 11/12/2017   Recent Labs    04/09/17 1155  10/14/17 0805 11/11/17 0926 11/12/17 0816  NA  --    < > 140 138 139  K  --    < > 3.7 3.5 3.5  CL  --    < > 108 103 104  CO2  --    < > '24 27 27  ' GLUCOSE  --    < > 128* 118* 108*  BUN  --    < > '14 15 18  ' CREATININE  --    < > 0.76 0.71 0.74  CALCIUM  --    < > 9.1 9.0 9.2  GFRNONAA  --    < > >60 >60 >60  GFRAA  --    < > >60 >60 >60  PROT  --    < > 7.1 7.2 7.4  ALBUMIN  --    < > 4.2 4.3 4.4  AST  --    < > 36 36 43*  ALT  --    < > '23 24 30  ' ALKPHOS  --    < > 101 95 92  BILITOT 1.7*   < > 1.5* 2.0* 1.5*  BILIDIR 0.3  --   --   --   --    < > = values in this interval not displayed.   Hepatitis panel negative.  LDH 228, mildly elevated Beta 2 microglobulin normal.   Bone marrow biopsy  Showed hypercellular  marrow involved with non Hodgkin's B cell lymphoma. The morphologic and immunophenotypic features are most consistent with chronic lymphocytic leukemia/small lymphocytic lymphoma. Bone marrow Cytogenetics : normal.  Peripheral blood FISH panel negative for CCND1- IGH mutation, ATM, 12, 13q, Tp53 mutation.   RADIOGRAPHIC STUDIES: I have personally reviewed the radiological images as listed and agreed with the findings in the report. CT angio abd/pelvis showed Type 1b endoleak, AAA aneurysm, Spleen has decreased in size since PET scan in April 2019.   ASSESSMENT & PLAN:  72 yo male presents for immunotherapy treatment of Stage  IV CLL. 1. CLL (chronic lymphocytic leukemia) ([place])   2. Thrombocytopenia ([facility])   3. Other fatigue   4. Long term current use of anticoagulant therapy   5. Drug-induced neutropenia ([place])    # CLL/thrombocytopenia Labs reviewed and discussed with patient. Acute thrombocytopenia, platelet counts was 24,000.  Denies any.  He has already taken his today's dose. No active bleeding. Patient is also on chronic coagulation with Coumadin for CAD.  Advised patient to hold Coumadin, repeat CBC on 11/12/2017 and 11/14/2017, close monitor his platelet counts Advised patient that he is at high risk of bleeding.  If you notice any bleeding he is to notify me or go to ER immediately for further evaluation. Further anticoagulation management pending results.  #Severe Neutropenia also due to venotoclax. No fever. Hold venetoclax. Repeat labs. Patient understands that if he spikes fever need to notify me or go to ER immediately.   #Lymphopenia, chronic, stable.  Continue to monitor. #Chronic anticoagulation. Hold coumadin.  We spent sufficient time to discuss many aspect of care, questions were answered to patient's satisfaction.  The patient knows to call the clinic with any problems questions or concerns.  Return of visit: 1 week.  Total face to face encounter time for this patient visit was 25 min. >50% of the time was  spent in counseling and coordination of care.    Earlie Server, MD, PhD Hematology Oncology [facility] at [facility] Pager- [phone] 11/12/2017

## 2017-11-12 NOTE — Telephone Encounter (Signed)
Per Dr Tasia Catchings hold warfarin, add lab appt for tomorrow and Friday.  Message sent to schedulers and notified pt of this information.

## 2017-11-12 NOTE — Telephone Encounter (Signed)
Patient called asking if he is to restart or discontinue his warfarin. Please advise

## 2017-11-13 ENCOUNTER — Other Ambulatory Visit: Payer: Self-pay

## 2017-11-13 ENCOUNTER — Other Ambulatory Visit: Payer: Self-pay | Admitting: *Deleted

## 2017-11-13 ENCOUNTER — Inpatient Hospital Stay: Payer: Medicare PPO

## 2017-11-13 DIAGNOSIS — Z7901 Long term (current) use of anticoagulants: Secondary | ICD-10-CM

## 2017-11-13 DIAGNOSIS — C9110 Chronic lymphocytic leukemia of B-cell type not having achieved remission: Secondary | ICD-10-CM | POA: Diagnosis not present

## 2017-11-13 DIAGNOSIS — D696 Thrombocytopenia, unspecified: Secondary | ICD-10-CM

## 2017-11-13 LAB — SAMPLE TO BLOOD BANK

## 2017-11-13 LAB — PROTIME-INR
INR: 1.88
Prothrombin Time: 21.4 seconds — ABNORMAL HIGH (ref 11.4–15.2)

## 2017-11-13 NOTE — Progress Notes (Signed)
Opened in error

## 2017-11-13 NOTE — Progress Notes (Signed)
Open in error

## 2017-11-14 ENCOUNTER — Other Ambulatory Visit: Payer: Self-pay | Admitting: *Deleted

## 2017-11-14 ENCOUNTER — Inpatient Hospital Stay: Payer: Medicare PPO

## 2017-11-14 ENCOUNTER — Other Ambulatory Visit: Payer: Self-pay | Admitting: Oncology

## 2017-11-14 ENCOUNTER — Other Ambulatory Visit: Payer: Self-pay

## 2017-11-14 DIAGNOSIS — C9110 Chronic lymphocytic leukemia of B-cell type not having achieved remission: Secondary | ICD-10-CM

## 2017-11-14 DIAGNOSIS — Z7901 Long term (current) use of anticoagulants: Secondary | ICD-10-CM

## 2017-11-14 DIAGNOSIS — D696 Thrombocytopenia, unspecified: Secondary | ICD-10-CM

## 2017-11-14 LAB — CBC WITH DIFFERENTIAL/PLATELET
ABS IMMATURE GRANULOCYTES: 0.01 10*3/uL (ref 0.00–0.07)
Basophils Absolute: 0 10*3/uL (ref 0.0–0.1)
Basophils Relative: 1 %
Eosinophils Absolute: 0 10*3/uL (ref 0.0–0.5)
Eosinophils Relative: 0 %
HCT: 38.3 % — ABNORMAL LOW (ref 39.0–52.0)
HEMOGLOBIN: 13.2 g/dL (ref 13.0–17.0)
IMMATURE GRANULOCYTES: 1 %
LYMPHS PCT: 35 %
Lymphs Abs: 0.6 10*3/uL — ABNORMAL LOW (ref 0.7–4.0)
MCH: 30.4 pg (ref 26.0–34.0)
MCHC: 34.5 g/dL (ref 30.0–36.0)
MCV: 88.2 fL (ref 80.0–100.0)
MONOS PCT: 20 %
Monocytes Absolute: 0.3 10*3/uL (ref 0.1–1.0)
NEUTROS ABS: 0.7 10*3/uL — AB (ref 1.7–7.7)
NEUTROS PCT: 43 %
PLATELETS: 31 10*3/uL — AB (ref 150–400)
RBC: 4.34 MIL/uL (ref 4.22–5.81)
RDW: 17.4 % — ABNORMAL HIGH (ref 11.5–15.5)
WBC: 1.6 10*3/uL — ABNORMAL LOW (ref 4.0–10.5)
nRBC: 0 % (ref 0.0–0.2)

## 2017-11-14 LAB — PROTIME-INR
INR: 1.44
Prothrombin Time: 17.4 seconds — ABNORMAL HIGH (ref 11.4–15.2)

## 2017-11-17 ENCOUNTER — Inpatient Hospital Stay: Payer: Medicare PPO

## 2017-11-17 DIAGNOSIS — C9110 Chronic lymphocytic leukemia of B-cell type not having achieved remission: Secondary | ICD-10-CM

## 2017-11-17 LAB — PROTIME-INR
INR: 1.07
Prothrombin Time: 13.8 seconds (ref 11.4–15.2)

## 2017-11-17 LAB — CBC WITH DIFFERENTIAL/PLATELET
Abs Immature Granulocytes: 0 10*3/uL (ref 0.00–0.07)
BASOS PCT: 1 %
Basophils Absolute: 0 10*3/uL (ref 0.0–0.1)
Eosinophils Absolute: 0 10*3/uL (ref 0.0–0.5)
Eosinophils Relative: 0 %
HCT: 37.4 % — ABNORMAL LOW (ref 39.0–52.0)
Hemoglobin: 12.9 g/dL — ABNORMAL LOW (ref 13.0–17.0)
IMMATURE GRANULOCYTES: 0 %
Lymphocytes Relative: 33 %
Lymphs Abs: 0.6 10*3/uL — ABNORMAL LOW (ref 0.7–4.0)
MCH: 30.7 pg (ref 26.0–34.0)
MCHC: 34.5 g/dL (ref 30.0–36.0)
MCV: 89 fL (ref 80.0–100.0)
MONO ABS: 0.3 10*3/uL (ref 0.1–1.0)
MONOS PCT: 16 %
NEUTROS PCT: 50 %
Neutro Abs: 0.9 10*3/uL — ABNORMAL LOW (ref 1.7–7.7)
PLATELETS: 37 10*3/uL — AB (ref 150–400)
RBC: 4.2 MIL/uL — ABNORMAL LOW (ref 4.22–5.81)
RDW: 17.9 % — ABNORMAL HIGH (ref 11.5–15.5)
WBC: 1.9 10*3/uL — AB (ref 4.0–10.5)
nRBC: 0 % (ref 0.0–0.2)

## 2017-11-19 ENCOUNTER — Inpatient Hospital Stay: Payer: Medicare PPO

## 2017-11-19 ENCOUNTER — Inpatient Hospital Stay (HOSPITAL_BASED_OUTPATIENT_CLINIC_OR_DEPARTMENT_OTHER): Payer: Medicare PPO | Admitting: Oncology

## 2017-11-19 ENCOUNTER — Encounter: Payer: Self-pay | Admitting: Oncology

## 2017-11-19 ENCOUNTER — Other Ambulatory Visit: Payer: Self-pay

## 2017-11-19 VITALS — BP 140/89 | HR 77 | Temp 97.4°F | Resp 18 | Wt 209.5 lb

## 2017-11-19 DIAGNOSIS — D696 Thrombocytopenia, unspecified: Secondary | ICD-10-CM

## 2017-11-19 DIAGNOSIS — C9110 Chronic lymphocytic leukemia of B-cell type not having achieved remission: Secondary | ICD-10-CM

## 2017-11-19 DIAGNOSIS — Z7901 Long term (current) use of anticoagulants: Secondary | ICD-10-CM

## 2017-11-19 DIAGNOSIS — F17210 Nicotine dependence, cigarettes, uncomplicated: Secondary | ICD-10-CM | POA: Diagnosis not present

## 2017-11-19 DIAGNOSIS — Z5111 Encounter for antineoplastic chemotherapy: Secondary | ICD-10-CM

## 2017-11-19 DIAGNOSIS — D702 Other drug-induced agranulocytosis: Secondary | ICD-10-CM

## 2017-11-19 DIAGNOSIS — Z955 Presence of coronary angioplasty implant and graft: Secondary | ICD-10-CM

## 2017-11-19 DIAGNOSIS — Z79899 Other long term (current) drug therapy: Secondary | ICD-10-CM

## 2017-11-19 DIAGNOSIS — I2510 Atherosclerotic heart disease of native coronary artery without angina pectoris: Secondary | ICD-10-CM

## 2017-11-19 DIAGNOSIS — Z7982 Long term (current) use of aspirin: Secondary | ICD-10-CM

## 2017-11-19 LAB — CBC WITH DIFFERENTIAL/PLATELET
Abs Immature Granulocytes: 0.01 10*3/uL (ref 0.00–0.07)
BASOS ABS: 0 10*3/uL (ref 0.0–0.1)
BASOS PCT: 1 %
EOS ABS: 0 10*3/uL (ref 0.0–0.5)
Eosinophils Relative: 0 %
HCT: 40.1 % (ref 39.0–52.0)
Hemoglobin: 13.4 g/dL (ref 13.0–17.0)
IMMATURE GRANULOCYTES: 1 %
Lymphocytes Relative: 32 %
Lymphs Abs: 0.7 10*3/uL (ref 0.7–4.0)
MCH: 30.2 pg (ref 26.0–34.0)
MCHC: 33.4 g/dL (ref 30.0–36.0)
MCV: 90.3 fL (ref 80.0–100.0)
MONOS PCT: 18 %
Monocytes Absolute: 0.4 10*3/uL (ref 0.1–1.0)
NRBC: 0 % (ref 0.0–0.2)
Neutro Abs: 1.1 10*3/uL — ABNORMAL LOW (ref 1.7–7.7)
Neutrophils Relative %: 48 %
PLATELETS: 39 10*3/uL — AB (ref 150–400)
RBC: 4.44 MIL/uL (ref 4.22–5.81)
RDW: 18.3 % — AB (ref 11.5–15.5)
WBC: 2.2 10*3/uL — AB (ref 4.0–10.5)

## 2017-11-19 NOTE — Progress Notes (Signed)
Hematology/Oncology Follow up note [facility] [phone] [phone]   Patient Care Team: [name], MD as PCP - General (Internal Medicine) Earlie Server, MD as Medical Oncologist (Medical Oncology)  REFERRING PROVIDER: [name], MD  REASON FOR VISIT Follow up for management of CLL HISTORY OF PRESENTING ILLNESS:  Martin Adkins is a  72 y.o.  male with PMH listed below who was referred to me for evaluation of lymphocytosis.  Recent lab work on 03/05/2017 showed wbc 11.4, hb 14.6, platelet count 117,000, lymphocytosis 63.5%, neutrophil 22%, Report chronic fatigue, no weight loss, fever or chills.  He reports feeling chest "rattleness". Has for CT chest which showed acute finding, chronic finding includes  postsurgical changes consistent with coronary bypass grafting. One of the bypass grafts arising from the aorta shows apparent thrombosis and an area of distal aneurysmal dilatation which is also Thrombosed. Right adrenal adenoma stable in appearance.Mild scarring in the lung bases.Fatty liver.   Takes Aspirin 74m, coumadine, plavix. He is on anticoagulation for previous history of clots.  Denies any bleeding events. Use to drink hard liquir daily, quitted for 4-5 years. Current drinks wine on weekend.    Image Studies # 04/04/2017  [place] showed 1.  Splenomegaly.  No focal splenic lesions evident.2. Increased liver echogenicity, a finding most likely indicative of hepatic steatosis. While no focal liver lesions are evident on this study, it must be cautioned that the sensitivity of ultrasound for detection of focal liver lesions is diminished in this circumstance.3. Pancreas obscured by gas. Portions of inferior vena cava obscured by gas.4.  Gallbladder absent.5. Status post abdominal aortic aneurysm repair. No periaortic fluid.  # 04/21/2017 PET scan 1. Moderate splenomegaly with uniform splenic hypermetabolism,compatible  with the provided history of lymphoproliferative disease.No additional hypermetabolic sites of lymphoproliferative disease. Specifically, no hypermetabolic lymphadenopathy Hepatic steatosis, aortic atherosclerosis. Aneurysm.    # CLL, stage IV disease given spelencemagly, thrombocytopenia, symptomatic with fatigue and weight loss.  CLL IPL score 4, high risk.   # status post abdominal aortic aneurysm repair on 09/25/2017   INTERVAL HISTORY Martin JBARD HAUPERTis a 72y.o. male who has above presents for assessment of toxicities from Venetoclax for treatment of CLL  #Patient has been on Venetoclax 400 mg daily since last week when lab work showed depression.  Venetoclax was on hold Has developed neutropenia, severe thrombocytopenia.  Coumadin was held due to cytopenia. Patient is on Coumadin for history of, NSTEMI and CAD No active bleeding.  Denies any active bleeding events.  + Easy bruising. Fatigue has improved since last visit. Denies any fever or chills.  Review of Systems  Constitutional: Positive for malaise/fatigue. Negative for chills, fever and weight loss.  HENT: Negative for congestion, ear discharge, ear pain, nosebleeds, sinus pain and sore throat.   Eyes: Negative for double vision, photophobia, pain, discharge and redness.  Respiratory: Negative for cough, hemoptysis, sputum production, shortness of breath and wheezing.   Cardiovascular: Negative for chest pain, palpitations, orthopnea, claudication and leg swelling.  Gastrointestinal: Negative for abdominal pain, blood in stool, constipation, diarrhea, heartburn, melena, nausea and vomiting.  Genitourinary: Negative for dysuria, flank pain, frequency, hematuria and urgency.  Musculoskeletal: Negative for back pain, myalgias and neck pain.  Skin: Negative for itching and rash.  Neurological: Negative for dizziness, tingling, tremors, speech change, focal weakness, weakness and headaches.  Endo/Heme/Allergies: Negative for  environmental allergies. Bruises/bleeds easily.  Psychiatric/Behavioral: Negative for depression, hallucinations and suicidal ideas. The patient is  not nervous/anxious.     MEDICAL HISTORY:  Past Medical History:  Diagnosis Date  . AAA (abdominal aortic aneurysm) without rupture ([place]) 04/05/2014  . AAA (abdominal aortic aneurysm) without rupture ([place]) 04/05/2014  . Abdominal aortic aneurysm without rupture ([place])   . Acute non-ST elevation myocardial infarction (NSTEMI) ([place]) 08/15/2014  . 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/28/2014  . APS (antiphospholipid syndrome) ([place])   . Arteriosclerosis of coronary artery 04/05/2014   Overview:  Sp cabg with lima to lad svg to d1 om1 rca 2004 with occluded svg to rca and d1 and pci stetn of om1 graft 2015   . Arthritis   . B12 deficiency 03/21/2017  . Barrett's esophagus   . Benign essential HTN 05/02/2014  . BPH (benign prostatic hyperplasia)   . CAD (coronary artery disease)   . CHF (congestive heart failure) ([place])   . Chronic systolic heart failure ([place]) 04/19/2014   Overview:  With segmental LV systolic dysfunction ejection fraction of 35%   . CLL (chronic lymphocytic leukemia) ([place])   . CLL (chronic lymphocytic leukemia) ([place]) 05/24/2017  . DVT (deep venous thrombosis) ([place])   . Dysrhythmia    ATRIAL FIB.  Marland Kitchen GERD (gastroesophageal reflux disease)   . History of hiatal hernia   . History of shingles 2013  . Hyperlipemia   . Hyperplastic polyps of stomach   . Hypertension   . Myocardial infarction ([place]) 07/30/2014  . NSTEMI (non-ST elevated myocardial infarction) ([place])   . OSA (obstructive sleep apnea)   . Osteoarthritis   . Pre-diabetes   . PVD (peripheral vascular disease) ([place])   . RA (rheumatoid arthritis) ([place])   . SOB (shortness of breath)     SURGICAL HISTORY: Past Surgical History:  Procedure Laterality Date  . ABDOMINAL AORTA STENT    . CHOLECYSTECTOMY    . COLONOSCOPY  11/24/2009  . CORONARY ANGIOPLASTY WITH STENT PLACEMENT   2015  . CORONARY ARTERY BYPASS GRAFT    . ENDOVASCULAR REPAIR/STENT GRAFT N/A 09/24/2017   Procedure: ENDOVASCULAR REPAIR/STENT GRAFT;  Surgeon: Algernon Huxley, MD;  Location: [facility];  Service: Cardiovascular;  Laterality: N/A;  . ESOPHAGOGASTRODUODENOSCOPY  05/26/02  03/23/12  . ESOPHAGOGASTRODUODENOSCOPY (EGD) WITH PROPOFOL N/A 10/28/2016   Procedure: ESOPHAGOGASTRODUODENOSCOPY (EGD) WITH PROPOFOL;  Surgeon: Manya Silvas, MD;  Location[facility] ENDOSCOPY;  Service: Endoscopy;  Laterality: N/A;  . PERIPHERAL VASCULAR CATHETERIZATION N/A 09/06/2014   Procedure: IVC Filter Removal;  Surgeon: Katha Cabal, MD;  Location: [facility];  Service: Cardiovascular;  Laterality: N/A;  . TRANSURETHRAL RESECTION OF PROSTATE N/A 02/20/2015   Procedure: TRANSURETHRAL RESECTION OF THE PROSTATE (TURP);  Surgeon: Hollice Espy, MD;  Location: [facility] OR[facility];  Service: Urology;  Laterality: N/A;    SOCIAL HISTORY: Social History   Socioeconomic History  . Marital status: Legally Separated    Spouse name: Not on file  . Number of children: Not on file  . Years of education: Not on file  . Highest education level: Not on file  Occupational History  . Not on file  Social Needs  . Financial resource strain: Not hard at all  . Food insecurity:    Worry: Never true    Inability: Never true  . Transportation needs:    Medical: No    Non-medical: No  Tobacco Use  . Smoking status: Current Some Day Smoker    Packs/day: 0.25    Years: 20.00    Pack years: 5.00    Types: Cigarettes  .  Smokeless tobacco: Never Used  . Tobacco comment: per pt nicotine patch number21m -smokes 1-3 cig/per d now  Substance and Sexual Activity  . Alcohol use: Yes    Alcohol/week: 0.0 standard drinks    Comment: social on weekends  . Drug use: No  . Sexual activity: Never  Lifestyle  . Physical activity:    Days per week: 7 days    Minutes per session: 40 min  . Stress: Not at all  Relationships  .  Social connections:    Talks on phone: More than three times a week    Gets together: More than three times a week    Attends religious service: More than 4 times per year    Active member of club or organization: No    Attends meetings of clubs or organizations: Never    Relationship status: Separated  . Intimate partner violence:    Fear of current or ex partner: No    Emotionally abused: No    Physically abused: No    Forced sexual activity: No  Other Topics Concern  . Not on file  Social History Narrative  . Not on file    FAMILY HISTORY: Family History  Problem Relation Age of Onset  . Cancer Mother [phone]      breast ca  . Diabetes Mother   . Coronary artery disease Father   . Heart attack Father   . Prostate cancer Brother   . Bladder Cancer Neg Hx   . Kidney cancer Neg Hx     ALLERGIES:  is allergic to ace inhibitors; pravastatin; rosuvastatin; simvastatin; amoxicillin; niacin; and penicillins.  MEDICATIONS:  Current Outpatient Medications  Medication Sig Dispense Refill  . allopurinol (ZYLOPRIM) 300 MG tablet Take 1 tablet (300 mg total) by mouth daily. 30 tablet 0  . amLODipine (NORVASC) 10 MG tablet Take 10 mg by mouth daily.     .Marland Kitchenaspirin EC 81 MG tablet Take 81 mg by mouth daily.    .Marland Kitchenatorvastatin (LIPITOR) 80 MG tablet TAKE 1 TABLET (80 MG TOTAL) BY MOUTH ONCE DAILY.  11  . fluticasone (FLONASE) 50 MCG/ACT nasal spray Place 2 sprays into both nostrils daily.     .Marland Kitchengabapentin (NEURONTIN) 300 MG capsule Take 1 capsule (300 mg total) by mouth 3 (three) times daily. 90 capsule 0  . hydroxychloroquine (PLAQUENIL) 200 MG tablet Take 1 tablet by mouth daily.    . Ipratropium-Albuterol (COMBIVENT) 20-100 MCG/ACT AERS respimat Inhale 1 puff into the lungs every 6 (six) hours. 1 Inhaler 11  . isosorbide mononitrate (IMDUR) 30 MG 24 hr tablet Take 30 mg by mouth daily.     .Marland Kitchenlosartan (COZAAR) 100 MG tablet 100 mg daily.     .Phillis KnackEYE DROPS 0.4-0.3 % SOLN Apply 1  drop to eye daily as needed.     . metoprolol succinate (TOPROL-XL) 100 MG 24 hr tablet Take 100 mg by mouth daily.     . montelukast (SINGULAIR) 10 MG tablet Take 1 tablet (10 mg total) by mouth as needed. Take before Gazyva infusion. 20 tablet 0  . pantoprazole (PROTONIX) 40 MG tablet Take 40 mg by mouth daily.     . potassium chloride SA (K-DUR,KLOR-CON) 20 MEQ tablet Take 20 mEq by mouth daily.     .Marland Kitchenvenetoclax 100 MG TABS Take 400 mg by mouth daily. Take with food and a full glass of water. 120 tablet 2  . vitamin B-12 (CYANOCOBALAMIN) 100 MCG tablet Take 1  tablet (100 mcg total) by mouth daily. 90 tablet 1  . albuterol (PROVENTIL HFA;VENTOLIN HFA) 108 (90 Base) MCG/ACT inhaler Inhale 2 puffs into the lungs every 6 (six) hours as needed for wheezing or shortness of breath. (Patient not taking: Reported on 11/19/2017) 1 Inhaler 0  . furosemide (LASIX) 20 MG tablet Take 20 mg by mouth daily.  11  . warfarin (COUMADIN) 1 MG tablet Take by mouth daily. Patient takes once a week with 24m tablet for a total of 6 mg per day    . warfarin (COUMADIN) 5 MG tablet TAKE 1 TABLET (5 MG TOTAL) BY MOUTH ONCE DAILY.  5   No current facility-administered medications for this visit.      PHYSICAL EXAMINATION: ECOG 1 Vitals:   11/19/17 0900  BP: 140/89  Pulse: 77  Resp: 18  Temp: (!) 97.4 F (36.3 C)  Physical Exam  Constitutional: He is oriented to person, place, and time. No distress.  HENT:  Head: Normocephalic and atraumatic.  Nose: Nose normal.  Mouth/Throat: Oropharynx is clear and moist. No oropharyngeal exudate.  Eyes: Pupils are equal, round, and reactive to light. EOM are normal. Left eye exhibits no discharge. No scleral icterus.  Neck: Normal range of motion. Neck supple. No JVD present.  Cardiovascular: Normal rate, regular rhythm and normal heart sounds.  No murmur heard. Pulmonary/Chest: Effort normal. No respiratory distress. He has no wheezes.  Abdominal: Soft. He exhibits no  distension. There is no tenderness.  Musculoskeletal: Normal range of motion. He exhibits no edema, tenderness or deformity.  Lymphadenopathy:    He has no cervical adenopathy.  Neurological: He is alert and oriented to person, place, and time. No cranial nerve deficit. He exhibits normal muscle tone. Coordination normal.  Skin: Skin is warm and dry. He is not diaphoretic. No erythema.  Psychiatric: Affect normal.  Nursing note and vitals reviewed.    LABORATORY DATA:  I have reviewed the data as listed Lab Results  Component Value Date   WBC 2.2 (L) 11/19/2017   HGB 13.4 11/19/2017   HCT 40.1 11/19/2017   MCV 90.3 11/19/2017   PLT 39 (L) 11/19/2017   Recent Labs    04/09/17 1155  10/14/17 0805 11/11/17 0926 11/12/17 0816  NA  --    < > 140 138 139  K  --    < > 3.7 3.5 3.5  CL  --    < > 108 103 104  CO2  --    < > '24 27 27  ' GLUCOSE  --    < > 128* 118* 108*  BUN  --    < > '14 15 18  ' CREATININE  --    < > 0.76 0.71 0.74  CALCIUM  --    < > 9.1 9.0 9.2  GFRNONAA  --    < > >60 >60 >60  GFRAA  --    < > >60 >60 >60  PROT  --    < > 7.1 7.2 7.4  ALBUMIN  --    < > 4.2 4.3 4.4  AST  --    < > 36 36 43*  ALT  --    < > '23 24 30  ' ALKPHOS  --    < > 101 95 92  BILITOT 1.7*   < > 1.5* 2.0* 1.5*  BILIDIR 0.3  --   --   --   --    < > = values in this interval not  displayed.   Hepatitis panel negative.  LDH 228, mildly elevated Beta 2 microglobulin normal.   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 The morphologic and immunophenotypic features are most consistent with chronic lymphocytic leukemia/small lymphocytic lymphoma. Bone marrow Cytogenetics : normal.  Peripheral blood FISH panel negative for CCND1- IGH mutation, ATM, 12, 13q, Tp53 mutation.   RADIOGRAPHIC STUDIES: I have personally reviewed the radiological images as listed and agreed with the findings in the report. CT angio abd/pelvis showed Type 1b  endoleak, AAA aneurysm, Spleen has decreased in size since PET scan in April 2019.   ASSESSMENT & PLAN:  71 yo male presents for immunotherapy treatment of Stage IV CLL. 1. CLL (chronic lymphocytic leukemia) ([place])   2. Drug-induced neutropenia ([facility])   3. Thrombocytopenia ([place])   4. Encounter for antineoplastic chemotherapy    # CLL Continue hold Venetoclax due to severe cytopenia.  #Thrombocytopenia, improving slowly.  Labs are reviewed and discussed with patient. No need for transfusion.   #Anticoagulation for atrial fibrillation/ MI/CAD Have discussed with patient's cardiologist Dr. Nehemiah Massed who agreed with holding coumadin.  Patient has also been on aspirin with Coumadin. Cardiology ok with discontinuing Aspirin if needed.  When patient's thrombocytopenia recovers, patient will resume coagulation. Looking into the coverage for Eliquis for convenience of holding and restarting bone marrow suppression may recur once patient is back on Venetoclax.  Discussed with Dr. Nehemiah Massed.   Advised patient that he is at high risk of bleeding.  If you notice any bleeding he is to notify me or go to ER immediately for further evaluation. Further anticoagulation management pending results.  # Chemotherapy induced neutropenia has improved. ANC 1.1  We spent sufficient time to discuss many aspect of care, questions were answered to patient's satisfaction.   The patient knows to call the clinic with any problems questions or concerns. Total face to face encounter time for this patient visit was 25 min. >50% of the time was  spent in counseling and coordination of care.   Return of visit: 1 week.    Earlie Server, MD, PhD Hematology Oncology [facility] at [facility] Pager- [phone] 11/19/2017

## 2017-11-19 NOTE — Progress Notes (Signed)
Patient here for follow up. States he feels better since he has been off of venetoclax.

## 2017-11-26 ENCOUNTER — Other Ambulatory Visit: Payer: Self-pay

## 2017-11-26 ENCOUNTER — Encounter: Payer: Self-pay | Admitting: Oncology

## 2017-11-26 ENCOUNTER — Inpatient Hospital Stay (HOSPITAL_BASED_OUTPATIENT_CLINIC_OR_DEPARTMENT_OTHER): Payer: Medicare PPO | Admitting: Oncology

## 2017-11-26 ENCOUNTER — Inpatient Hospital Stay: Payer: Medicare PPO

## 2017-11-26 VITALS — BP 120/80 | HR 98 | Temp 98.0°F | Resp 18 | Wt 209.8 lb

## 2017-11-26 DIAGNOSIS — I2510 Atherosclerotic heart disease of native coronary artery without angina pectoris: Secondary | ICD-10-CM

## 2017-11-26 DIAGNOSIS — D696 Thrombocytopenia, unspecified: Secondary | ICD-10-CM | POA: Diagnosis not present

## 2017-11-26 DIAGNOSIS — D702 Other drug-induced agranulocytosis: Secondary | ICD-10-CM

## 2017-11-26 DIAGNOSIS — C9110 Chronic lymphocytic leukemia of B-cell type not having achieved remission: Secondary | ICD-10-CM

## 2017-11-26 DIAGNOSIS — F17210 Nicotine dependence, cigarettes, uncomplicated: Secondary | ICD-10-CM

## 2017-11-26 DIAGNOSIS — Z955 Presence of coronary angioplasty implant and graft: Secondary | ICD-10-CM

## 2017-11-26 DIAGNOSIS — Z7901 Long term (current) use of anticoagulants: Secondary | ICD-10-CM

## 2017-11-26 DIAGNOSIS — Z7982 Long term (current) use of aspirin: Secondary | ICD-10-CM

## 2017-11-26 DIAGNOSIS — Z79899 Other long term (current) drug therapy: Secondary | ICD-10-CM

## 2017-11-26 LAB — CBC WITH DIFFERENTIAL/PLATELET
ABS IMMATURE GRANULOCYTES: 0.01 10*3/uL (ref 0.00–0.07)
BASOS ABS: 0 10*3/uL (ref 0.0–0.1)
Basophils Relative: 0 %
EOS ABS: 0 10*3/uL (ref 0.0–0.5)
EOS PCT: 0 %
HCT: 37 % — ABNORMAL LOW (ref 39.0–52.0)
Hemoglobin: 12.6 g/dL — ABNORMAL LOW (ref 13.0–17.0)
Immature Granulocytes: 0 %
LYMPHS PCT: 25 %
Lymphs Abs: 0.8 10*3/uL (ref 0.7–4.0)
MCH: 31.2 pg (ref 26.0–34.0)
MCHC: 34.1 g/dL (ref 30.0–36.0)
MCV: 91.6 fL (ref 80.0–100.0)
Monocytes Absolute: 0.5 10*3/uL (ref 0.1–1.0)
Monocytes Relative: 14 %
NRBC: 0 % (ref 0.0–0.2)
Neutro Abs: 2 10*3/uL (ref 1.7–7.7)
Neutrophils Relative %: 61 %
Platelets: 57 10*3/uL — ABNORMAL LOW (ref 150–400)
RBC: 4.04 MIL/uL — AB (ref 4.22–5.81)
RDW: 19.5 % — ABNORMAL HIGH (ref 11.5–15.5)
WBC: 3.3 10*3/uL — AB (ref 4.0–10.5)

## 2017-11-28 NOTE — Progress Notes (Signed)
Hematology/Oncology Follow up note [facility] [phone] [phone]   Patient Care Team: [name], MD as PCP - General (Internal Medicine) Earlie Server, MD as Medical Oncologist (Medical Oncology)  REFERRING PROVIDER: [name], MD  REASON FOR VISIT Follow up for management of CLL HISTORY OF PRESENTING ILLNESS:  Martin Adkins is a  72 y.o.  male with PMH listed below who was referred to me for evaluation of lymphocytosis.  Recent lab work on 03/05/2017 showed wbc 11.4, hb 14.6, platelet count 117,000, lymphocytosis 63.5%, neutrophil 22%, Report chronic fatigue, no weight loss, fever or chills.  He reports feeling chest "rattleness". Has for CT chest which showed acute finding, chronic finding includes  postsurgical changes consistent with coronary bypass grafting. One of the bypass grafts arising from the aorta shows apparent thrombosis and an area of distal aneurysmal dilatation which is also Thrombosed. Right adrenal adenoma stable in appearance.Mild scarring in the lung bases.Fatty liver.   Takes Aspirin 23m, coumadine, plavix. He is on anticoagulation for previous history of clots.  Denies any bleeding events. Use to drink hard liquir daily, quitted for 4-5 years. Current drinks wine on weekend.    Image Studies # 04/04/2017  [place]dom[place] showed 1.  Splenomegaly.  No focal splenic lesions evident.2. Increased liver echogenicity, a finding most likely indicative of hepatic steatosis. While no focal liver lesions are evident on this study, it must be cautioned that the sensitivity of ultrasound for detection of focal liver lesions is diminished in this circumstance.3. Pancreas obscured by gas. Portions of inferior vena cava obscured by gas.4.  Gallbladder absent.5. Status post abdominal aortic aneurysm repair. No periaortic fluid.  # 04/21/2017 PET scan 1. Moderate splenomegaly with uniform splenic hypermetabolism,compatible  with the provided history of lymphoproliferative disease.No additional hypermetabolic sites of lymphoproliferative disease. Specifically, no hypermetabolic lymphadenopathy Hepatic steatosis, aortic atherosclerosis. Aneurysm.    # CLL, stage IV disease given spelencemagly, thrombocytopenia, symptomatic with fatigue and weight loss.  CLL IPL score 4, high risk.   # status post abdominal aortic aneurysm repair on 09/25/2017   INTERVAL HISTORY Martin JOREY MOUREis a 72y.o. male who has above presents for assessment toxicities from Venetoclax for treatment of CLL Patient denies any bleeding events. Reports fatigue is better since holding Venetoclax.  No active bleeding events.  Review of Systems  Constitutional: Positive for fatigue. Negative for appetite change, chills, fever and unexpected weight change.  HENT:   Negative for hearing loss and voice change.   Eyes: Negative for eye problems and icterus.  Respiratory: Negative for chest tightness, cough and shortness of breath.   Cardiovascular: Negative for chest pain and leg swelling.  Gastrointestinal: Negative for abdominal distention and abdominal pain.  Endocrine: Negative for hot flashes.  Genitourinary: Negative for difficulty urinating, dysuria and frequency.   Musculoskeletal: Negative for arthralgias.  Skin: Negative for itching and rash.  Neurological: Negative for light-headedness and numbness.  Hematological: Negative for adenopathy. Does not bruise/bleed easily.  Psychiatric/Behavioral: Negative for confusion.     MEDICAL HISTORY:  Past Medical History:  Diagnosis Date  . AAA (abdominal aortic aneurysm) without rupture ([place] 04/05/2014  . AAA (abdominal aortic aneurysm) without rupture ([place] 04/05/2014  . Abdominal aortic aneurysm without rupture ([place]   . Acute non-ST elevation myocardial infarction (NSTEMI) ([place] 08/15/2014  . 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/28/2014  . APS (antiphospholipid syndrome) ([place]   .  Arteriosclerosis of coronary artery 04/05/2014   Overview:  Sp cabg with lima to  lad svg to d1 om1 rca 2004 with occluded svg to rca and d1 and pci stetn of om1 graft 2015   . Arthritis   . B12 deficiency 03/21/2017  . Barrett's esophagus   . Benign essential HTN 05/02/2014  . BPH (benign prostatic hyperplasia)   . CAD (coronary artery disease)   . CHF (congestive heart failure) ([place])   . Chronic systolic heart failure ([place]) 04/19/2014   Overview:  With segmental LV systolic dysfunction ejection fraction of 35%   . CLL (chronic lymphocytic leukemia) ([place])   . CLL (chronic lymphocytic leukemia) ([place]) 05/24/2017  . DVT (deep venous thrombosis) ([place])   . Dysrhythmia    ATRIAL FIB.  Marland Kitchen GERD (gastroesophageal reflux disease)   . History of hiatal hernia   . History of shingles 2013  . Hyperlipemia   . Hyperplastic polyps of stomach   . Hypertension   . Myocardial infarction ([place]) 07/30/2014  . NSTEMI (non-ST elevated myocardial infarction) ([place])   . OSA (obstructive sleep apnea)   . Osteoarthritis   . Pre-diabetes   . PVD (peripheral vascular disease) ([place])   . RA (rheumatoid arthritis) ([place])   . SOB (shortness of breath)     SURGICAL HISTORY: Past Surgical History:  Procedure Laterality Date  . ABDOMINAL AORTA STENT    . CHOLECYSTECTOMY    . COLONOSCOPY  11/24/2009  . CORONARY ANGIOPLASTY WITH STENT PLACEMENT  2015  . CORONARY ARTERY BYPASS GRAFT    . ENDOVASCULAR REPAIR/STENT GRAFT N/A 09/24/2017   Procedure: ENDOVASCULAR REPAIR/STENT GRAFT;  Surgeon: Algernon Huxley, MD;  Location: [facility];  Service: Cardiovascular;  Laterality: N/A;  . ESOPHAGOGASTRODUODENOSCOPY  05/26/02  03/23/12  . ESOPHAGOGASTRODUODENOSCOPY (EGD) WITH PROPOFOL N/A 10/28/2016   Procedure: ESOPHAGOGASTRODUODENOSCOPY (EGD) WITH PROPOFOL;  Surgeon: Manya Silvas, MD;  Location: [facility] END[facility];  Service: Endoscopy;  Laterality: N/A;  . PERIPHERAL VASCULAR CATHETERIZATION N/A 09/06/2014   Procedure: IVC  Filter Removal;  Surgeon: Katha Cabal, MD;  Location: [place];  Service: Cardiovascular;  Laterality: N/A;  . TRANSURETHRAL RESECTION OF PROSTATE N/A 02/20/2015   Procedure: TRANSURETHRAL RESECTION OF THE PROSTATE (TURP);  Surgeon: Hollice Espy, MD;  Location: [facility];  Service: Urology;  Laterality: N/A;    SOCIAL HISTORY: Social History   Socioeconomic History  . Marital status: Legally Separated    Spouse name: Not on file  . Number of children: Not on file  . Years of education: Not on file  . Highest education level: Not on file  Occupational History  . Not on file  Social Needs  . Financial resource strain: Not hard at all  . Food insecurity:    Worry: Never true    Inability: Never true  . Transportation needs:    Medical: No    Non-medical: No  Tobacco Use  . Smoking status: Current Some Day Smoker    Packs/day: 0.25    Years: 20.00    Pack years: 5.00    Types: Cigarettes  . Smokeless tobacco: Never Used  . Tobacco comment: per pt nicotine patch number85m -smokes 1-3 cig/per d now  Substance and Sexual Activity  . Alcohol use: Yes    Alcohol/week: 0.0 standard drinks    Comment: social on weekends  . Drug use: No  . Sexual activity: Never  Lifestyle  . Physical activity:    Days per week: 7 days    Minutes per session: 40 min  . Stress: Not at all  Relationships  .  Social connections:    Talks on phone: More than three times a week    Gets together: More than three times a week    Attends religious service: More than 4 times per year    Active member of club or organization: No    Attends meetings of clubs or organizations: Never    Relationship status: Separated  . Intimate partner violence:    Fear of current or ex partner: No    Emotionally abused: No    Physically abused: No    Forced sexual activity: No  Other Topics Concern  . Not on file  Social History Narrative  . Not on file    FAMILY HISTORY: Family History    Problem Relation Age of Onset  . Cancer Mother 17       breast ca  . Diabetes Mother   . Coronary artery disease Father   . Heart attack Father   . Prostate cancer Brother   . Bladder Cancer Neg Hx   . Kidney cancer Neg Hx     ALLERGIES:  is allergic to ace inhibitors; pravastatin; rosuvastatin; simvastatin; amoxicillin; niacin; and penicillins.  MEDICATIONS:  Current Outpatient Medications  Medication Sig Dispense Refill  . allopurinol (ZYLOPRIM) 300 MG tablet Take 1 tablet (300 mg total) by mouth daily. 30 tablet 0  . amLODipine (NORVASC) 10 MG tablet Take 10 mg by mouth daily.     Marland Kitchen aspirin EC 81 MG tablet Take 81 mg by mouth daily.    Marland Kitchen atorvastatin (LIPITOR) 80 MG tablet TAKE 1 TABLET (80 MG TOTAL) BY MOUTH ONCE DAILY.  11  . fluticasone (FLONASE) 50 MCG/ACT nasal spray Place 2 sprays into both nostrils daily.     . furosemide (LASIX) 20 MG tablet Take 20 mg by mouth daily.  11  . gabapentin (NEURONTIN) 300 MG capsule Take 1 capsule (300 mg total) by mouth 3 (three) times daily. 90 capsule 0  . hydroxychloroquine (PLAQUENIL) 200 MG tablet Take 1 tablet by mouth daily.    . Ipratropium-Albuterol (COMBIVENT) 20-100 MCG/ACT AERS respimat Inhale 1 puff into the lungs every 6 (six) hours. 1 Inhaler 11  . isosorbide mononitrate (IMDUR) 30 MG 24 hr tablet Take 30 mg by mouth daily.     Marland Kitchen losartan (COZAAR) 100 MG tablet 100 mg daily.     Phillis Knack EYE DROPS 0.4-0.3 % SOLN Apply 1 drop to eye daily as needed.     . metoprolol succinate (TOPROL-XL) 100 MG 24 hr tablet Take 100 mg by mouth daily.     . montelukast (SINGULAIR) 10 MG tablet Take 1 tablet (10 mg total) by mouth as needed. Take before Gazyva infusion. 20 tablet 0  . pantoprazole (PROTONIX) 40 MG tablet Take 40 mg by mouth daily.     . potassium chloride SA (K-DUR,KLOR-CON) 20 MEQ tablet Take 20 mEq by mouth daily.     Marland Kitchen venetoclax 100 MG TABS Take 400 mg by mouth daily. Take with food and a full glass of water. 120 tablet 2   . vitamin B-12 (CYANOCOBALAMIN) 100 MCG tablet Take 1 tablet (100 mcg total) by mouth daily. 90 tablet 1  . warfarin (COUMADIN) 1 MG tablet Take by mouth daily. Patient takes once a week with 2m tablet for a total of 6 mg per day    . warfarin (COUMADIN) 5 MG tablet TAKE 1 TABLET (5 MG TOTAL) BY MOUTH ONCE DAILY.  5  . albuterol (PROVENTIL HFA;VENTOLIN HFA) 108 (90 Base) MCG/ACT  inhaler Inhale 2 puffs into the lungs every 6 (six) hours as needed for wheezing or shortness of breath. (Patient not taking: Reported on 11/19/2017) 1 Inhaler 0   No current facility-administered medications for this visit.      PHYSICAL EXAMINATION: ECOG 1 Vitals:   11/26/17 1450  BP: 120/80  Pulse: 98  Resp: 18  Temp: 98 F (36.7 C)  Physical Exam  Constitutional: He is oriented to person, place, and time. No distress.  HENT:  Head: Normocephalic and atraumatic.  Nose: Nose normal.  Mouth/Throat: Oropharynx is clear and moist. No oropharyngeal exudate.  Eyes: Pupils are equal, round, and reactive to light. EOM are normal. Left eye exhibits no discharge. No scleral icterus.  Neck: Normal range of motion. Neck supple. No JVD present.  Cardiovascular: Normal rate, regular rhythm and normal heart sounds.  No murmur heard. Pulmonary/Chest: Effort normal. No respiratory distress. He has no wheezes. He has no rales. He exhibits no tenderness.  Abdominal: Soft. He exhibits no distension. There is no tenderness.  Musculoskeletal: Normal range of motion. He exhibits no edema, tenderness or deformity.  Lymphadenopathy:    He has no cervical adenopathy.  Neurological: He is alert and oriented to person, place, and time. No cranial nerve deficit. He exhibits normal muscle tone. Coordination normal.  Skin: Skin is warm and dry. He is not diaphoretic. No erythema.  Psychiatric: Affect normal.  Nursing note and vitals reviewed.    LABORATORY DATA:  I have reviewed the data as listed Lab Results  Component  Value Date   WBC 3.3 (L) 11/26/2017   HGB 12.6 (L) 11/26/2017   HCT 37.0 (L) 11/26/2017   MCV 91.6 11/26/2017   PLT 57 (L) 11/26/2017   Recent Labs    04/09/17 1155  10/14/17 0805 11/11/17 0926 11/12/17 0816  NA  --    < > 140 138 139  K  --    < > 3.7 3.5 3.5  CL  --    < > 108 103 104  CO2  --    < > '24 27 27  ' GLUCOSE  --    < > 128* 118* 108*  BUN  --    < > '14 15 18  ' CREATININE  --    < > 0.76 0.71 0.74  CALCIUM  --    < > 9.1 9.0 9.2  GFRNONAA  --    < > >60 >60 >60  GFRAA  --    < > >60 >60 >60  PROT  --    < > 7.1 7.2 7.4  ALBUMIN  --    < > 4.2 4.3 4.4  AST  --    < > 36 36 43*  ALT  --    < > '23 24 30  ' ALKPHOS  --    < > 101 95 92  BILITOT 1.7*   < > 1.5* 2.0* 1.5*  BILIDIR 0.3  --   --   --   --    < > = values in this interval not displayed.   Hepatitis panel negative.  LDH 228, mildly elevated Beta 2 microglobulin normal.   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 The morphologic and immunophenotypic features are most consistent with chronic lymphocytic leukemia/small lymphocytic lymphoma. Bone marrow Cytogenetics : normal.  Peripheral blood FISH panel negative for CCND1- IGH mutation, ATM, 12, 13q, Tp53 mutation.   RADIOGRAPHIC STUDIES: I have personally reviewed the  radiological images as listed and agreed with the findings in the report. CT angio abd/pelvis showed Type 1b endoleak, AAA aneurysm, Spleen has decreased in size since PET scan in April 2019.   ASSESSMENT & PLAN:  72 yo male presents for immunotherapy treatment of Stage IV CLL. 1. Drug-induced neutropenia ([place])   2. Thrombocytopenia ([place])   3. CLL (chronic lymphocytic leukemia) ([place])    # CLL continue holding Venetoclax due to severe cytopenia. Thrombocytopenia is slowly recovering Continue hold anticoagulation for now. Patient used to be on Coumadin for atrial fibrillation/MI/CAD. Once his platelets recovers to be above 75,000, will  start patient on anticoagulation. Previously discussed with patient's cardiologist.  Will switch patient to Xarelto 20 mg daily. For now continue aspirin 81 mg.  # Chemotherapy induced neutropenia has resolved.  . Follow-up in 2 weeks.   The patient knows to call the clinic with any problems questions or concerns. Total face to face encounter time for this patient visit was 15 min. >50% of the time was  spent in counseling and coordination of care.   Return of visit: 1 week.    Earlie Server, MD, PhD Hematology Oncology [facility] Pager- [phone] 11/28/2017

## 2017-12-08 ENCOUNTER — Other Ambulatory Visit: Payer: Self-pay

## 2017-12-09 ENCOUNTER — Inpatient Hospital Stay (HOSPITAL_BASED_OUTPATIENT_CLINIC_OR_DEPARTMENT_OTHER): Payer: Medicare PPO | Admitting: Oncology

## 2017-12-09 ENCOUNTER — Other Ambulatory Visit: Payer: Self-pay

## 2017-12-09 ENCOUNTER — Encounter: Payer: Self-pay | Admitting: Oncology

## 2017-12-09 ENCOUNTER — Inpatient Hospital Stay: Payer: Medicare PPO | Attending: Oncology

## 2017-12-09 VITALS — BP 127/86 | HR 90 | Temp 97.3°F | Resp 18 | Wt 210.0 lb

## 2017-12-09 DIAGNOSIS — Z7982 Long term (current) use of aspirin: Secondary | ICD-10-CM | POA: Diagnosis not present

## 2017-12-09 DIAGNOSIS — C9110 Chronic lymphocytic leukemia of B-cell type not having achieved remission: Secondary | ICD-10-CM

## 2017-12-09 DIAGNOSIS — Z79899 Other long term (current) drug therapy: Secondary | ICD-10-CM | POA: Diagnosis not present

## 2017-12-09 DIAGNOSIS — Z86718 Personal history of other venous thrombosis and embolism: Secondary | ICD-10-CM | POA: Insufficient documentation

## 2017-12-09 DIAGNOSIS — I10 Essential (primary) hypertension: Secondary | ICD-10-CM | POA: Insufficient documentation

## 2017-12-09 DIAGNOSIS — D696 Thrombocytopenia, unspecified: Secondary | ICD-10-CM

## 2017-12-09 DIAGNOSIS — F17210 Nicotine dependence, cigarettes, uncomplicated: Secondary | ICD-10-CM | POA: Diagnosis not present

## 2017-12-09 DIAGNOSIS — Z7901 Long term (current) use of anticoagulants: Secondary | ICD-10-CM

## 2017-12-09 LAB — CBC WITH DIFFERENTIAL/PLATELET
Abs Immature Granulocytes: 0.01 10*3/uL (ref 0.00–0.07)
BASOS PCT: 0 %
Basophils Absolute: 0 10*3/uL (ref 0.0–0.1)
EOS ABS: 0 10*3/uL (ref 0.0–0.5)
EOS PCT: 1 %
HCT: 41.8 % (ref 39.0–52.0)
Hemoglobin: 13.9 g/dL (ref 13.0–17.0)
IMMATURE GRANULOCYTES: 0 %
Lymphocytes Relative: 19 %
Lymphs Abs: 0.7 10*3/uL (ref 0.7–4.0)
MCH: 31.4 pg (ref 26.0–34.0)
MCHC: 33.3 g/dL (ref 30.0–36.0)
MCV: 94.4 fL (ref 80.0–100.0)
Monocytes Absolute: 0.3 10*3/uL (ref 0.1–1.0)
Monocytes Relative: 9 %
NEUTROS PCT: 71 %
NRBC: 0 % (ref 0.0–0.2)
Neutro Abs: 2.7 10*3/uL (ref 1.7–7.7)
PLATELETS: 72 10*3/uL — AB (ref 150–400)
RBC: 4.43 MIL/uL (ref 4.22–5.81)
RDW: 19.6 % — AB (ref 11.5–15.5)
WBC: 3.8 10*3/uL — AB (ref 4.0–10.5)

## 2017-12-09 NOTE — Progress Notes (Signed)
Patient here for follow up. Complains of being tired and having no energy. Also complains of shortness of breath "from time to time."

## 2017-12-09 NOTE — Progress Notes (Signed)
Hematology/Oncology Follow up note [facility] Telephone:([phone] [phone]   Patient Care Team: [name], MD as PCP - General (Internal Medicine) Earlie Server, MD as Medical Oncologist (Medical Oncology)  REFERRING PROVIDER: [name], MD  REASON FOR VISIT Follow up for management of CLL HISTORY OF PRESENTING ILLNESS:  Martin Adkins is a  72 y.o.  male with PMH listed below who was referred to me for evaluation of lymphocytosis.  Recent lab work on 03/05/2017 showed wbc 11.4, hb 14.6, platelet count 117,000, lymphocytosis 63.5%, neutrophil 22%, Report chronic fatigue, no weight loss, fever or chills.  He reports feeling chest "rattleness". Has for CT chest which showed acute finding, chronic finding includes  postsurgical changes consistent with coronary bypass grafting. One of the bypass grafts arising from the aorta shows apparent thrombosis and an area of distal aneurysmal dilatation which is also Thrombosed. Right adrenal adenoma stable in appearance.Mild scarring in the lung bases.Fatty liver.   Takes Aspirin 17m, coumadine, plavix. He is on anticoagulation for previous history of clots.  Denies any bleeding events. Use to drink hard liquir daily, quitted for 4-5 years. Current drinks wine on weekend.    Image Studies # 04/04/2017  [place]dom[place] showed 1.  Splenomegaly.  No focal splenic lesions evident.2. Increased liver echogenicity, a finding most likely indicative of hepatic steatosis. While no focal liver lesions are evident on this study, it must be cautioned that the sensitivity of ultrasound for detection of focal liver lesions is diminished in this circumstance.3. Pancreas obscured by gas. Portions of inferior vena cava obscured by gas.4.  Gallbladder absent.5. Status post abdominal aortic aneurysm repair. No periaortic fluid.  # 04/21/2017 PET scan 1. Moderate splenomegaly with uniform splenic hypermetabolism,compatible  with the provided history of lymphoproliferative disease.No additional hypermetabolic sites of lymphoproliferative disease. Specifically, no hypermetabolic lymphadenopathy Hepatic steatosis, aortic atherosclerosis. Aneurysm.    # CLL, stage IV disease given spelencemagly, thrombocytopenia, symptomatic with fatigue and weight loss.  CLL IPL score 4, high risk.   # status post abdominal aortic aneurysm repair on 09/25/2017   INTERVAL HISTORY Martin JOUSMANE SEEMANis a 72y.o. male who has above presents for assessing toxicities from Venetoclax for treatment for CLL.  Off Venetoclax since 11/11/2017.  Denies any bleeding events.  Chronic fatigue and shortness of breath with moderate exetion.  Denies chest pain.   Review of Systems  Constitutional: Positive for fatigue. Negative for appetite change, chills, fever and unexpected weight change.  HENT:   Negative for hearing loss and voice change.   Eyes: Negative for eye problems and icterus.  Respiratory: Positive for shortness of breath. Negative for chest tightness and cough.   Cardiovascular: Negative for chest pain and leg swelling.  Gastrointestinal: Negative for abdominal distention and abdominal pain.  Endocrine: Negative for hot flashes.  Genitourinary: Negative for difficulty urinating, dysuria and frequency.   Musculoskeletal: Negative for arthralgias.  Skin: Negative for itching and rash.  Neurological: Negative for light-headedness and numbness.  Hematological: Negative for adenopathy. Does not bruise/bleed easily.  Psychiatric/Behavioral: Negative for confusion.     MEDICAL HISTORY:  Past Medical History:  Diagnosis Date  . AAA (abdominal aortic aneurysm) without rupture ([place] 04/05/2014  . AAA (abdominal aortic aneurysm) without rupture ([place] 04/05/2014  . Abdominal aortic aneurysm without rupture ([place]   . Acute non-ST elevation myocardial infarction (NSTEMI) ([place] 08/15/2014  . 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/28/2014  .  APS (antiphospholipid syndrome) ([place]   . Arteriosclerosis of coronary artery  04/05/2014   Overview:  Sp cabg with lima to lad svg to d1 om1 rca 2004 with occluded svg to rca and d1 and pci stetn of om1 graft 2015   . Arthritis   . B12 deficiency 03/21/2017  . Barrett's esophagus   . Benign essential HTN 05/02/2014  . BPH (benign prostatic hyperplasia)   . CAD (coronary artery disease)   . CHF (congestive heart failure) ([place])   . Chronic systolic heart failure ([place]) 04/19/2014   Overview:  With segmental LV systolic dysfunction ejection fraction of 35%   . CLL (chronic lymphocytic leukemia) ([place])   . CLL (chronic lymphocytic leukemia) ([place]) 05/24/2017  . DVT (deep venous thrombosis) ([place])   . Dysrhythmia    ATRIAL FIB.  Marland Kitchen GERD (gastroesophageal reflux disease)   . History of hiatal hernia   . History of shingles 2013  . Hyperlipemia   . Hyperplastic polyps of stomach   . Hypertension   . Myocardial infarction ([place]) 07/30/2014  . NSTEMI (non-ST elevated myocardial infarction) ([place])   . OSA (obstructive sleep apnea)   . Osteoarthritis   . Pre-diabetes   . PVD (peripheral vascular disease) ([place])   . RA (rheumatoid arthritis) ([place])   . SOB (shortness of breath)     SURGICAL HISTORY: Past Surgical History:  Procedure Laterality Date  . ABDOMINAL AORTA STENT    . CHOLECYSTECTOMY    . COLONOSCOPY  11/24/2009  . CORONARY ANGIOPLASTY WITH STENT PLACEMENT  2015  . CORONARY ARTERY BYPASS GRAFT    . ENDOVASCULAR REPAIR/STENT GRAFT N/A 09/24/2017   Procedure: ENDOVASCULAR REPAIR/STENT GRAFT;  Surgeon: Algernon Huxley, MD;  Location: [place] [facility];  Service: Cardiovascular;  Laterality: N/A;  . ESOPHAGOGASTRODUODENOSCOPY  05/26/02  03/23/12  . ESOPHAGOGASTRODUODENOSCOPY (EGD) WITH PROPOFOL N/A 10/28/2016   Procedure: ESOPHAGOGASTRODUODENOSCOPY (EGD) WITH PROPOFOL;  Surgeon: Manya Silvas, MD;  Location[facility] ENDOSCOPY;  Service: Endoscopy;  Laterality: N/A;  . PERIPHERAL VASCULAR  CATHETERIZATION N/A 09/06/2014   Procedure: IVC Filter Removal;  Surgeon: Katha Cabal, MD;  Location: [place] [facility];  Service: Cardiovascular;  Laterality: N/A;  . TRANSURETHRAL RESECTION OF PROSTATE N/A 02/20/2015   Procedure: TRANSURETHRAL RESECTION OF THE PROSTATE (TURP);  Surgeon: Hollice Espy, MD;  Location: [facility] ORS;  Service: Urology;  Laterality: N/A;    SOCIAL HISTORY: Social History   Socioeconomic History  . Marital status: Legally Separated    Spouse name: Not on file  . Number of children: Not on file  . Years of education: Not on file  . Highest education level: Not on file  Occupational History  . Not on file  Social Needs  . Financial resource strain: Not hard at all  . Food insecurity:    Worry: Never true    Inability: Never true  . Transportation needs:    Medical: No    Non-medical: No  Tobacco Use  . Smoking status: Current Some Day Smoker    Packs/day: 0.25    Years: 20.00    Pack years: 5.00    Types: Cigarettes  . Smokeless tobacco: Never Used  . Tobacco comment: per pt nicotine patch number63m -smokes 1-3 cig/per d now  Substance and Sexual Activity  . Alcohol use: Yes    Alcohol/week: 0.0 standard drinks    Comment: social on weekends  . Drug use: No  . Sexual activity: Never  Lifestyle  . Physical activity:    Days per week: 7 days    Minutes per session: 40  min  . Stress: Not at all  Relationships  . Social connections:    Talks on phone: More than three times a week    Gets together: More than three times a week    Attends religious service: More than 4 times per year    Active member of club or organization: No    Attends meetings of clubs or organizations: Never    Relationship status: Separated  . Intimate partner violence:    Fear of current or ex partner: No    Emotionally abused: No    Physically abused: No    Forced sexual activity: No  Other Topics Concern  . Not on file  Social History Narrative  . Not on  file    FAMILY HISTORY: Family History  Problem Relation Age of Onset  . Cancer Mother 14       breast ca  . Diabetes Mother   . Coronary artery disease Father   . Heart attack Father   . Prostate cancer Brother   . Bladder Cancer Neg Hx   . Kidney cancer Neg Hx     ALLERGIES:  is allergic to ace inhibitors; pravastatin; rosuvastatin; simvastatin; amoxicillin; niacin; and penicillins.  MEDICATIONS:  Current Outpatient Medications  Medication Sig Dispense Refill  . albuterol (PROVENTIL HFA;VENTOLIN HFA) 108 (90 Base) MCG/ACT inhaler Inhale 2 puffs into the lungs every 6 (six) hours as needed for wheezing or shortness of breath. 1 Inhaler 0  . allopurinol (ZYLOPRIM) 300 MG tablet Take 1 tablet (300 mg total) by mouth daily. 30 tablet 0  . amLODipine (NORVASC) 10 MG tablet Take 10 mg by mouth daily.     Marland Kitchen aspirin EC 81 MG tablet Take 81 mg by mouth daily.    Marland Kitchen atorvastatin (LIPITOR) 80 MG tablet TAKE 1 TABLET (80 MG TOTAL) BY MOUTH ONCE DAILY.  11  . fluticasone (FLONASE) 50 MCG/ACT nasal spray Place 2 sprays into both nostrils daily.     . furosemide (LASIX) 20 MG tablet Take 20 mg by mouth daily.  11  . gabapentin (NEURONTIN) 300 MG capsule Take 1 capsule (300 mg total) by mouth 3 (three) times daily. 90 capsule 0  . hydroxychloroquine (PLAQUENIL) 200 MG tablet Take 1 tablet by mouth daily.    . Ipratropium-Albuterol (COMBIVENT) 20-100 MCG/ACT AERS respimat Inhale 1 puff into the lungs every 6 (six) hours. 1 Inhaler 11  . isosorbide mononitrate (IMDUR) 30 MG 24 hr tablet Take 30 mg by mouth daily.     Marland Kitchen losartan (COZAAR) 100 MG tablet 100 mg daily.     Phillis Knack EYE DROPS 0.4-0.3 % SOLN Apply 1 drop to eye daily as needed.     . metoprolol succinate (TOPROL-XL) 100 MG 24 hr tablet Take 100 mg by mouth daily.     . montelukast (SINGULAIR) 10 MG tablet Take 1 tablet (10 mg total) by mouth as needed. Take before Gazyva infusion. 20 tablet 0  . pantoprazole (PROTONIX) 40 MG tablet  Take 40 mg by mouth daily.     . potassium chloride SA (K-DUR,KLOR-CON) 20 MEQ tablet Take 20 mEq by mouth daily.     Marland Kitchen venetoclax 100 MG TABS Take 400 mg by mouth daily. Take with food and a full glass of water. 120 tablet 2  . vitamin B-12 (CYANOCOBALAMIN) 100 MCG tablet Take 1 tablet (100 mcg total) by mouth daily. 90 tablet 1  . warfarin (COUMADIN) 1 MG tablet Take by mouth daily. Patient takes once a week  with 3m tablet for a total of 6 mg per day    . warfarin (COUMADIN) 5 MG tablet TAKE 1 TABLET (5 MG TOTAL) BY MOUTH ONCE DAILY.  5   No current facility-administered medications for this visit.      PHYSICAL EXAMINATION: ECOG 1 Vitals:   12/09/17 0942  BP: 127/86  Pulse: 90  Resp: 18  Temp: (!) 97.3 F (36.3 C)  SpO2: 98%  Physical Exam  Constitutional: He is oriented to person, place, and time. No distress.  HENT:  Head: Normocephalic and atraumatic.  Mouth/Throat: No oropharyngeal exudate.  Eyes: Pupils are equal, round, and reactive to light. EOM are normal. Left eye exhibits no discharge. No scleral icterus.  Neck: Normal range of motion. Neck supple. No JVD present.  Cardiovascular: Normal rate, regular rhythm and normal heart sounds.  No murmur heard. Pulmonary/Chest: Effort normal and breath sounds normal. No respiratory distress.  Abdominal: Soft. He exhibits no distension. There is no tenderness.  Musculoskeletal: Normal range of motion. He exhibits no edema or deformity.  Lymphadenopathy:    He has no cervical adenopathy.  Neurological: He is alert and oriented to person, place, and time. No cranial nerve deficit. He exhibits normal muscle tone. Coordination normal.  Skin: Skin is warm and dry. He is not diaphoretic. No erythema.  Psychiatric: Affect normal.  Nursing note and vitals reviewed.    LABORATORY DATA:  I have reviewed the data as listed Lab Results  Component Value Date   WBC 3.8 (L) 12/09/2017   HGB 13.9 12/09/2017   HCT 41.8 12/09/2017    MCV 94.4 12/09/2017   PLT 72 (L) 12/09/2017   Recent Labs    04/09/17 1155  10/14/17 0805 11/11/17 0926 11/12/17 0816  NA  --    < > 140 138 139  K  --    < > 3.7 3.5 3.5  CL  --    < > 108 103 104  CO2  --    < > '24 27 27  ' GLUCOSE  --    < > 128* 118* 108*  BUN  --    < > '14 15 18  ' CREATININE  --    < > 0.76 0.71 0.74  CALCIUM  --    < > 9.1 9.0 9.2  GFRNONAA  --    < > >60 >60 >60  GFRAA  --    < > >60 >60 >60  PROT  --    < > 7.1 7.2 7.4  ALBUMIN  --    < > 4.2 4.3 4.4  AST  --    < > 36 36 43*  ALT  --    < > '23 24 30  ' ALKPHOS  --    < > 101 95 92  BILITOT 1.7*   < > 1.5* 2.0* 1.5*  BILIDIR 0.3  --   --   --   --    < > = values in this interval not displayed.   Hepatitis panel negative.  LDH 228, mildly elevated Beta 2 microglobulin normal.   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 The morphologic and immunophenotypic features are most consistent with chronic lymphocytic leukemia/small lymphocytic lymphoma. Bone marrow Cytogenetics : normal.  Peripheral blood FISH panel negative for CCND1- IGH mutation, ATM, 12, 13q, Tp53 mutation.   RADIOGRAPHIC STUDIES: I have personally reviewed the radiological images as listed and agreed with the findings in the report.  CT angio abd/pelvis showed Type 1b endoleak, AAA aneurysm, Spleen has decreased in size since PET scan in April 2019.   ASSESSMENT & PLAN:  72 yo male presents for immunotherapy treatment of Stage IV CLL. 1. CLL (chronic lymphocytic leukemia) ([place])   2. Thrombocytopenia ([place])    # Thrombocytopenia, improving, labs reviewed and discussed with patient.  Platelet recovers to 72,000. Anticipate platelet counts with further improvement in the next weeks.  Advise patient to continue Aspirin for now.  Cbc weekly. Once platelet counts >100,000, will switch him on Xalreto 16m daily.  # CLL continue resolved.  Follow up in 2 weeks.   The patient knows to call the  clinic with any problems questions or concerns. Total face to face encounter time for this patient visit was 125m. >50% of the time was  spent in counseling and coordination of care.   Return of visit: 1 week.    ZhEarlie ServerMD, PhD Hematology Oncology [facility]- [id number]/03/2017

## 2017-12-16 ENCOUNTER — Inpatient Hospital Stay: Payer: Medicare PPO

## 2017-12-16 DIAGNOSIS — C9110 Chronic lymphocytic leukemia of B-cell type not having achieved remission: Secondary | ICD-10-CM

## 2017-12-16 LAB — CBC WITH DIFFERENTIAL/PLATELET
ABS IMMATURE GRANULOCYTES: 0.02 10*3/uL (ref 0.00–0.07)
Basophils Absolute: 0 10*3/uL (ref 0.0–0.1)
Basophils Relative: 1 %
Eosinophils Absolute: 0.1 10*3/uL (ref 0.0–0.5)
Eosinophils Relative: 1 %
HEMATOCRIT: 40.5 % (ref 39.0–52.0)
Hemoglobin: 13.4 g/dL (ref 13.0–17.0)
IMMATURE GRANULOCYTES: 1 %
LYMPHS ABS: 0.8 10*3/uL (ref 0.7–4.0)
Lymphocytes Relative: 19 %
MCH: 31.7 pg (ref 26.0–34.0)
MCHC: 33.1 g/dL (ref 30.0–36.0)
MCV: 95.7 fL (ref 80.0–100.0)
MONOS PCT: 11 %
Monocytes Absolute: 0.5 10*3/uL (ref 0.1–1.0)
NEUTROS ABS: 2.9 10*3/uL (ref 1.7–7.7)
NEUTROS PCT: 67 %
Platelets: 83 10*3/uL — ABNORMAL LOW (ref 150–400)
RBC: 4.23 MIL/uL (ref 4.22–5.81)
RDW: 18.8 % — ABNORMAL HIGH (ref 11.5–15.5)
WBC: 4.2 10*3/uL (ref 4.0–10.5)
nRBC: 0 % (ref 0.0–0.2)

## 2017-12-24 ENCOUNTER — Telehealth: Payer: Self-pay | Admitting: Pharmacist

## 2017-12-24 ENCOUNTER — Other Ambulatory Visit: Payer: Self-pay

## 2017-12-24 ENCOUNTER — Inpatient Hospital Stay: Payer: Medicare PPO

## 2017-12-24 ENCOUNTER — Encounter: Payer: Self-pay | Admitting: Oncology

## 2017-12-24 ENCOUNTER — Inpatient Hospital Stay (HOSPITAL_BASED_OUTPATIENT_CLINIC_OR_DEPARTMENT_OTHER): Payer: Medicare PPO | Admitting: Oncology

## 2017-12-24 VITALS — BP 125/84 | HR 72 | Temp 98.7°F | Resp 18 | Wt 214.0 lb

## 2017-12-24 DIAGNOSIS — F17210 Nicotine dependence, cigarettes, uncomplicated: Secondary | ICD-10-CM | POA: Diagnosis not present

## 2017-12-24 DIAGNOSIS — C9110 Chronic lymphocytic leukemia of B-cell type not having achieved remission: Secondary | ICD-10-CM

## 2017-12-24 DIAGNOSIS — I10 Essential (primary) hypertension: Secondary | ICD-10-CM

## 2017-12-24 DIAGNOSIS — D696 Thrombocytopenia, unspecified: Secondary | ICD-10-CM

## 2017-12-24 DIAGNOSIS — Z79899 Other long term (current) drug therapy: Secondary | ICD-10-CM

## 2017-12-24 DIAGNOSIS — Z7982 Long term (current) use of aspirin: Secondary | ICD-10-CM

## 2017-12-24 DIAGNOSIS — Z86718 Personal history of other venous thrombosis and embolism: Secondary | ICD-10-CM | POA: Diagnosis not present

## 2017-12-24 DIAGNOSIS — Z7901 Long term (current) use of anticoagulants: Secondary | ICD-10-CM

## 2017-12-24 LAB — CBC WITH DIFFERENTIAL/PLATELET
Abs Immature Granulocytes: 0.02 10*3/uL (ref 0.00–0.07)
Basophils Absolute: 0 10*3/uL (ref 0.0–0.1)
Basophils Relative: 1 %
EOS PCT: 2 %
Eosinophils Absolute: 0.1 10*3/uL (ref 0.0–0.5)
HEMATOCRIT: 40.6 % (ref 39.0–52.0)
HEMOGLOBIN: 13.4 g/dL (ref 13.0–17.0)
Immature Granulocytes: 1 %
LYMPHS ABS: 1.1 10*3/uL (ref 0.7–4.0)
LYMPHS PCT: 24 %
MCH: 31.8 pg (ref 26.0–34.0)
MCHC: 33 g/dL (ref 30.0–36.0)
MCV: 96.2 fL (ref 80.0–100.0)
MONO ABS: 0.5 10*3/uL (ref 0.1–1.0)
MONOS PCT: 11 %
Neutro Abs: 2.7 10*3/uL (ref 1.7–7.7)
Neutrophils Relative %: 61 %
Platelets: 83 10*3/uL — ABNORMAL LOW (ref 150–400)
RBC: 4.22 MIL/uL (ref 4.22–5.81)
RDW: 17.4 % — ABNORMAL HIGH (ref 11.5–15.5)
WBC: 4.3 10*3/uL (ref 4.0–10.5)
nRBC: 0 % (ref 0.0–0.2)

## 2017-12-24 MED ORDER — RIVAROXABAN 20 MG PO TABS: 20.0000 mg | ORAL_TABLET | Freq: Every day | ORAL | 3 refills | Status: DC

## 2017-12-24 NOTE — Telephone Encounter (Signed)
Oral Chemotherapy Pharmacist Encounter  Called [employer] to check on the status of Xarelto assistance application for Mr. Alfrey. They stated that his application was still under review and they would have a determination in 24-72 hours. Dr. Tasia Catchings for let for Mr. Juba to get start on the Xarelto. Dispensed 14 day supply to Mr. Hunnell, hopefully we will have determination from [employer] Patient [employer] within that timeframe.  Dispensed samples to patient:  Medication: Xarelto 20mg  tablets Instructions: Take 1 tablet (20 mg total) by mouth daily with supper Quantity dispensed: 14 Days supply: 14 Manufacturer: Suszanne Finch: [id number]  Exp: 06/2019  Darl Pikes, PharmD, BCPS, [facility] Hematology/Oncology Clinical Pharmacist [facility]/HP/AP [facility] [phone]  12/24/2017 3:33 PM

## 2017-12-24 NOTE — Progress Notes (Signed)
Hematology/Oncology Follow up note [facility] [phone] [phone]   Patient Care Team: [name], MD as PCP - General (Internal Medicine) Earlie Server, MD as Medical Oncologist (Medical Oncology)  REFERRING PROVIDER: [name], MD  REASON FOR VISIT Follow up for management of CLL HISTORY OF PRESENTING ILLNESS:  Martin Adkins is a  72 y.o.  male with PMH listed below who was referred to me for evaluation of lymphocytosis.  Recent lab work on 03/05/2017 showed wbc 11.4, hb 14.6, platelet count 117,000, lymphocytosis 63.5%, neutrophil 22%, Report chronic fatigue, no weight loss, fever or chills.  He reports feeling chest "rattleness". Has for CT chest which showed acute finding, chronic finding includes  postsurgical changes consistent with coronary bypass grafting. One of the bypass grafts arising from the aorta shows apparent thrombosis and an area of distal aneurysmal dilatation which is also Thrombosed. Right adrenal adenoma stable in appearance.Mild scarring in the lung bases.Fatty liver.   Takes Aspirin 59m, coumadine, plavix. He is on anticoagulation for previous history of clots.  Denies any bleeding events. Use to drink hard liquir daily, quitted for 4-5 years. Current drinks wine on weekend.    Image Studies # 04/04/2017  [place]dom[place] showed 1.  Splenomegaly.  No focal splenic lesions evident.2. Increased liver echogenicity, a finding most likely indicative of hepatic steatosis. While no focal liver lesions are evident on this study, it must be cautioned that the sensitivity of ultrasound for detection of focal liver lesions is diminished in this circumstance.3. Pancreas obscured by gas. Portions of inferior vena cava obscured by gas.4.  Gallbladder absent.5. Status post abdominal aortic aneurysm repair. No periaortic fluid.  # 04/21/2017 PET scan 1. Moderate splenomegaly with uniform splenic hypermetabolism,compatible  with the provided history of lymphoproliferative disease.No additional hypermetabolic sites of lymphoproliferative disease. Specifically, no hypermetabolic lymphadenopathy Hepatic steatosis, aortic atherosclerosis. Aneurysm.    # CLL, stage IV disease given spelencemagly, thrombocytopenia, symptomatic with fatigue and weight loss.  CLL IPL score 4, high risk.   # status post abdominal aortic aneurysm repair on 09/25/2017   INTERVAL HISTORY Martin JHAWK MONESis a 72y.o. male who has above presents for assessing toxicities from Venetoclax for treatment for CLL.  He has been off venetoclax since 11/11/2017. Denies any acute bleeding events.  Chronic fatigue and shortness of breath is at baseline.  Denies any chest pain. Review of Systems  Constitutional: Positive for fatigue. Negative for appetite change, chills, fever and unexpected weight change.  HENT:   Negative for hearing loss and voice change.   Eyes: Negative for eye problems and icterus.  Respiratory: Positive for shortness of breath. Negative for chest tightness and cough.   Cardiovascular: Negative for chest pain and leg swelling.  Gastrointestinal: Negative for abdominal distention and abdominal pain.  Endocrine: Negative for hot flashes.  Genitourinary: Negative for difficulty urinating, dysuria and frequency.   Musculoskeletal: Negative for arthralgias.  Skin: Negative for itching and rash.  Neurological: Negative for light-headedness and numbness.  Hematological: Negative for adenopathy. Does not bruise/bleed easily.  Psychiatric/Behavioral: Negative for confusion.     MEDICAL HISTORY:  Past Medical History:  Diagnosis Date  . AAA (abdominal aortic aneurysm) without rupture ([place] 04/05/2014  . AAA (abdominal aortic aneurysm) without rupture ([place] 04/05/2014  . Abdominal aortic aneurysm without rupture ([place]   . Acute non-ST elevation myocardial infarction (NSTEMI) ([place] 08/15/2014  . Antepartum deep phlebothrombosis  07/28/2014  . APS (antiphospholipid syndrome) ([place]   . Arteriosclerosis of  coronary artery 04/05/2014   Overview:  Sp cabg with lima to lad svg to d1 om1 rca 2004 with occluded svg to rca and d1 and pci stetn of om1 graft 2015   . Arthritis   . B12 deficiency 03/21/2017  . Barrett's esophagus   . Benign essential HTN 05/02/2014  . BPH (benign prostatic hyperplasia)   . CAD (coronary artery disease)   . CHF (congestive heart failure) ([place])   . Chronic systolic heart failure ([place]) 04/19/2014   Overview:  With segmental LV systolic dysfunction ejection fraction of 35%   . CLL (chronic lymphocytic leukemia) ([place])   . CLL (chronic lymphocytic leukemia) ([place]) 05/24/2017  . DVT (deep venous thrombosis) ([place])   . Dysrhythmia    ATRIAL FIB.  Marland Kitchen GERD (gastroesophageal reflux disease)   . History of hiatal hernia   . History of shingles 2013  . Hyperlipemia   . Hyperplastic polyps of stomach   . Hypertension   . Myocardial infarction ([place]) 07/30/2014  . NSTEMI (non-ST elevated myocardial infarction) ([place])   . OSA (obstructive sleep apnea)   . Osteoarthritis   . Pre-diabetes   . PVD (peripheral vascular disease) ([place])   . RA (rheumatoid arthritis) ([place])   . SOB (shortness of breath)     SURGICAL HISTORY: Past Surgical History:  Procedure Laterality Date  . ABDOMINAL AORTA STENT    . CHOLECYSTECTOMY    . COLONOSCOPY  11/24/2009  . CORONARY ANGIOPLASTY WITH STENT PLACEMENT  2015  . CORONARY ARTERY BYPASS GRAFT    . ENDOVASCULAR REPAIR/STENT GRAFT N/A 09/24/2017   Procedure: ENDOVASCULAR REPAIR/STENT GRAFT;  Surgeon: Algernon Huxley, MD;  Location: [place] [facility];  Service: Cardiovascular;  Laterality: N/A;  . ESOPHAGOGASTRODUODENOSCOPY  05/26/02  03/23/12  . ESOPHAGOGASTRODUODENOSCOPY (EGD) WITH PROPOFOL N/A 10/28/2016   Procedure: ESOPHAGOGASTRODUODENOSCOPY (EGD) WITH PROPOFOL;  Surgeon: Manya Silvas, MD;  Location: [facility] ENDOSCOPY;  Service: Endoscopy;  Laterality: N/A;  . PERIPHERAL  VASCULAR CATHETERIZATION N/A 09/06/2014   Procedure: IVC Filter Removal;  Surgeon: Katha Cabal, MD;  Location: [place] [facility];  Service: Cardiovascular;  Laterality: N/A;  . TRANSURETHRAL RESECTION OF PROSTATE N/A 02/20/2015   Procedure: TRANSURETHRAL RESECTION OF THE PROSTATE (TURP);  Surgeon: Hollice Espy, MD;  Location: [facility] ORS;  Service: Urology;  Laterality: N/A;    SOCIAL HISTORY: Social History   Socioeconomic History  . Marital status: Legally Separated    Spouse name: Not on file  . Number of children: Not on file  . Years of education: Not on file  . Highest education level: Not on file  Occupational History  . Not on file  Social Needs  . Financial resource strain: Not hard at all  . Food insecurity:    Worry: Never true    Inability: Never true  . Transportation needs:    Medical: No    Non-medical: No  Tobacco Use  . Smoking status: Current Some Day Smoker    Packs/day: 0.25    Years: 20.00    Pack years: 5.00    Types: Cigarettes  . Smokeless tobacco: Never Used  . Tobacco comment: per pt nicotine patch number55m -smokes 1-3 cig/per d now  Substance and Sexual Activity  . Alcohol use: Yes    Alcohol/week: 0.0 standard drinks    Comment: social on weekends  . Drug use: No  . Sexual activity: Never  Lifestyle  . Physical activity:    Days per week: 7 days    Minutes per  session: 40 min  . Stress: Not at all  Relationships  . Social connections:    Talks on phone: More than three times a week    Gets together: More than three times a week    Attends religious service: More than 4 times per year    Active member of club or organization: No    Attends meetings of clubs or organizations: Never    Relationship status: Separated  . Intimate partner violence:    Fear of current or ex partner: No    Emotionally abused: No    Physically abused: No    Forced sexual activity: No  Other Topics Concern  . Not on file  Social History Narrative  .  Not on file    FAMILY HISTORY: Family History  Problem Relation Age of Onset  . Cancer Mother 105       breast ca  . Diabetes Mother   . Coronary artery disease Father   . Heart attack Father   . Prostate cancer Brother   . Bladder Cancer Neg Hx   . Kidney cancer Neg Hx     ALLERGIES:  is allergic to ace inhibitors; pravastatin; rosuvastatin; simvastatin; amoxicillin; niacin; and penicillins.  MEDICATIONS:  Current Outpatient Medications  Medication Sig Dispense Refill  . albuterol (PROVENTIL HFA;VENTOLIN HFA) 108 (90 Base) MCG/ACT inhaler Inhale 2 puffs into the lungs every 6 (six) hours as needed for wheezing or shortness of breath. 1 Inhaler 0  . allopurinol (ZYLOPRIM) 300 MG tablet Take 1 tablet (300 mg total) by mouth daily. 30 tablet 0  . amLODipine (NORVASC) 10 MG tablet Take 10 mg by mouth daily.     Marland Kitchen atorvastatin (LIPITOR) 80 MG tablet TAKE 1 TABLET (80 MG TOTAL) BY MOUTH ONCE DAILY.  11  . fluticasone (FLONASE) 50 MCG/ACT nasal spray Place 2 sprays into both nostrils daily.     . furosemide (LASIX) 20 MG tablet Take 20 mg by mouth daily.  11  . gabapentin (NEURONTIN) 300 MG capsule Take 1 capsule (300 mg total) by mouth 3 (three) times daily. 90 capsule 0  . hydroxychloroquine (PLAQUENIL) 200 MG tablet Take 1 tablet by mouth daily.    . Ipratropium-Albuterol (COMBIVENT) 20-100 MCG/ACT AERS respimat Inhale 1 puff into the lungs every 6 (six) hours. 1 Inhaler 11  . isosorbide mononitrate (IMDUR) 30 MG 24 hr tablet Take 30 mg by mouth daily.     Marland Kitchen losartan (COZAAR) 100 MG tablet 100 mg daily.     Phillis Knack EYE DROPS 0.4-0.3 % SOLN Apply 1 drop to eye daily as needed.     . metoprolol succinate (TOPROL-XL) 100 MG 24 hr tablet Take 100 mg by mouth daily.     . montelukast (SINGULAIR) 10 MG tablet Take 1 tablet (10 mg total) by mouth as needed. Take before Gazyva infusion. 20 tablet 0  . pantoprazole (PROTONIX) 40 MG tablet Take 40 mg by mouth daily.     . potassium chloride  SA (K-DUR,KLOR-CON) 20 MEQ tablet Take 20 mEq by mouth daily.     Marland Kitchen venetoclax 100 MG TABS Take 400 mg by mouth daily. Take with food and a full glass of water. 120 tablet 2  . vitamin B-12 (CYANOCOBALAMIN) 100 MCG tablet Take 1 tablet (100 mcg total) by mouth daily. 90 tablet 1  . rivaroxaban (XARELTO) 20 MG TABS tablet Take 1 tablet (20 mg total) by mouth daily with supper. 30 tablet 3   No current facility-administered medications for  this visit.      PHYSICAL EXAMINATION: ECOG 1 Vitals:   12/24/17 1438  BP: 125/84  Pulse: 72  Resp: 18  Temp: 98.7 F (37.1 C)  Physical Exam  Constitutional: He is oriented to person, place, and time. No distress.  HENT:  Head: Normocephalic and atraumatic.  Nose: Nose normal.  Mouth/Throat: Oropharynx is clear and moist. No oropharyngeal exudate.  Eyes: Pupils are equal, round, and reactive to light. EOM are normal. Left eye exhibits no discharge. No scleral icterus.  Neck: Normal range of motion. Neck supple. No JVD present.  Cardiovascular: Normal rate, regular rhythm and normal heart sounds.  No murmur heard. Pulmonary/Chest: Effort normal and breath sounds normal. No respiratory distress. He has no rales. He exhibits no tenderness.  Abdominal: Soft. He exhibits no distension. There is no abdominal tenderness.  Musculoskeletal: Normal range of motion.        General: No deformity or edema.  Lymphadenopathy:    He has no cervical adenopathy.  Neurological: He is alert and oriented to person, place, and time. No cranial nerve deficit. He exhibits normal muscle tone. Coordination normal.  Skin: Skin is warm and dry. He is not diaphoretic. No erythema.  Psychiatric: Affect normal.  Nursing note and vitals reviewed.    LABORATORY DATA:  I have reviewed the data as listed Lab Results  Component Value Date   WBC 4.3 12/24/2017   HGB 13.4 12/24/2017   HCT 40.6 12/24/2017   MCV 96.2 12/24/2017   PLT 83 (L) 12/24/2017   Recent Labs     04/09/17 1155  10/14/17 0805 11/11/17 0926 11/12/17 0816  NA  --    < > 140 138 139  K  --    < > 3.7 3.5 3.5  CL  --    < > 108 103 104  CO2  --    < > '24 27 27  ' GLUCOSE  --    < > 128* 118* 108*  BUN  --    < > '14 15 18  ' CREATININE  --    < > 0.76 0.71 0.74  CALCIUM  --    < > 9.1 9.0 9.2  GFRNONAA  --    < > >60 >60 >60  GFRAA  --    < > >60 >60 >60  PROT  --    < > 7.1 7.2 7.4  ALBUMIN  --    < > 4.2 4.3 4.4  AST  --    < > 36 36 43*  ALT  --    < > '23 24 30  ' ALKPHOS  --    < > 101 95 92  BILITOT 1.7*   < > 1.5* 2.0* 1.5*  BILIDIR 0.3  --   --   --   --    < > = values in this interval not displayed.   Hepatitis panel negative.  LDH 228, mildly elevated Beta 2 microglobulin normal.   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 The morphologic and immunophenotypic features are most consistent with chronic lymphocytic leukemia/small lymphocytic lymphoma. Bone marrow Cytogenetics : normal.  Peripheral blood FISH panel negative for CCND1- IGH mutation, ATM, 12, 13q, Tp53 mutation.   RADIOGRAPHIC STUDIES: I have personally reviewed the radiological images as listed and agreed with the findings in the report. CT angio abd/pelvis showed Type 1b endoleak, AAA aneurysm, Spleen has decreased in size since PET scan in April 2019.  ASSESSMENT & PLAN:  72 yo male presents for immunotherapy treatment of Stage IV CLL. 1. CLL (chronic lymphocytic leukemia) ([place])   2. Thrombocytopenia ([place])   3. 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  Platelet counts 83,000. For the CLL treatment, continue hold venetoclax. #Chronic atrial fibrillation on anticoagulation, patient's Coumadin has been discontinued due to severe thrombocytopenia. Now his platelet has improved to 83,000, okay to restart anticoagulation for A. fib. Given the fact that he may  need to be switch on and off anticoagulation in the future in case he develop additional bone marrow suppression on CLL treatments, after discussion with patient's cardiologist, we will switch him to [place].  Advised patient to start Xarelto 20 mg tomorrow.  Discontinue aspirin 81 mg.  Patient voices understanding and repeat instructions back to me.  Repeat CBC CMP in 2 weeks.   Follow-up in the clinic in 4 weeks for further assessment.   Total face to face encounter time for this patient visit was 15* min. >50% of the time was  spent in counseling and coordination of care.      Earlie Server, MD, PhD Hematology Oncology [facility] at [facility] Pager- [phone] 12/24/2017

## 2017-12-24 NOTE — Progress Notes (Signed)
Patient here for follow up. No concerns voiced.  °

## 2018-01-02 NOTE — Telephone Encounter (Signed)
Oral Oncology Patient Advocate Encounter   01/01/18-I called to check the status of Mr Martin Adkins's application for Xarelto.  I faxed in statements from the pharmacy showing his out of pockets costs on 12/29/17.   His application is still pending approval.  Will continue to check the status of his application.

## 2018-01-08 ENCOUNTER — Other Ambulatory Visit: Payer: Self-pay

## 2018-01-08 ENCOUNTER — Inpatient Hospital Stay: Payer: Medicare PPO | Attending: Oncology

## 2018-01-08 ENCOUNTER — Other Ambulatory Visit: Payer: Self-pay | Admitting: Oncology

## 2018-01-08 DIAGNOSIS — N400 Enlarged prostate without lower urinary tract symptoms: Secondary | ICD-10-CM | POA: Diagnosis not present

## 2018-01-08 DIAGNOSIS — K219 Gastro-esophageal reflux disease without esophagitis: Secondary | ICD-10-CM | POA: Insufficient documentation

## 2018-01-08 DIAGNOSIS — I252 Old myocardial infarction: Secondary | ICD-10-CM | POA: Insufficient documentation

## 2018-01-08 DIAGNOSIS — E538 Deficiency of other specified B group vitamins: Secondary | ICD-10-CM | POA: Diagnosis not present

## 2018-01-08 DIAGNOSIS — C9110 Chronic lymphocytic leukemia of B-cell type not having achieved remission: Secondary | ICD-10-CM | POA: Diagnosis not present

## 2018-01-08 DIAGNOSIS — G4733 Obstructive sleep apnea (adult) (pediatric): Secondary | ICD-10-CM | POA: Insufficient documentation

## 2018-01-08 DIAGNOSIS — I714 Abdominal aortic aneurysm, without rupture: Secondary | ICD-10-CM | POA: Insufficient documentation

## 2018-01-08 DIAGNOSIS — I739 Peripheral vascular disease, unspecified: Secondary | ICD-10-CM | POA: Diagnosis not present

## 2018-01-08 DIAGNOSIS — Z7902 Long term (current) use of antithrombotics/antiplatelets: Secondary | ICD-10-CM | POA: Diagnosis not present

## 2018-01-08 DIAGNOSIS — R0602 Shortness of breath: Secondary | ICD-10-CM | POA: Insufficient documentation

## 2018-01-08 DIAGNOSIS — M069 Rheumatoid arthritis, unspecified: Secondary | ICD-10-CM | POA: Insufficient documentation

## 2018-01-08 DIAGNOSIS — Z87891 Personal history of nicotine dependence: Secondary | ICD-10-CM | POA: Insufficient documentation

## 2018-01-08 DIAGNOSIS — R161 Splenomegaly, not elsewhere classified: Secondary | ICD-10-CM | POA: Diagnosis not present

## 2018-01-08 DIAGNOSIS — E785 Hyperlipidemia, unspecified: Secondary | ICD-10-CM | POA: Insufficient documentation

## 2018-01-08 DIAGNOSIS — Z7982 Long term (current) use of aspirin: Secondary | ICD-10-CM | POA: Insufficient documentation

## 2018-01-08 DIAGNOSIS — I2510 Atherosclerotic heart disease of native coronary artery without angina pectoris: Secondary | ICD-10-CM | POA: Diagnosis not present

## 2018-01-08 DIAGNOSIS — Z86718 Personal history of other venous thrombosis and embolism: Secondary | ICD-10-CM | POA: Diagnosis not present

## 2018-01-08 DIAGNOSIS — D696 Thrombocytopenia, unspecified: Secondary | ICD-10-CM

## 2018-01-08 DIAGNOSIS — K760 Fatty (change of) liver, not elsewhere classified: Secondary | ICD-10-CM | POA: Diagnosis not present

## 2018-01-08 DIAGNOSIS — I482 Chronic atrial fibrillation, unspecified: Secondary | ICD-10-CM | POA: Diagnosis not present

## 2018-01-08 DIAGNOSIS — D6959 Other secondary thrombocytopenia: Secondary | ICD-10-CM | POA: Diagnosis not present

## 2018-01-08 DIAGNOSIS — K449 Diaphragmatic hernia without obstruction or gangrene: Secondary | ICD-10-CM | POA: Diagnosis not present

## 2018-01-08 DIAGNOSIS — D3501 Benign neoplasm of right adrenal gland: Secondary | ICD-10-CM | POA: Diagnosis not present

## 2018-01-08 DIAGNOSIS — Z8042 Family history of malignant neoplasm of prostate: Secondary | ICD-10-CM | POA: Insufficient documentation

## 2018-01-08 DIAGNOSIS — Z803 Family history of malignant neoplasm of breast: Secondary | ICD-10-CM | POA: Insufficient documentation

## 2018-01-08 DIAGNOSIS — I110 Hypertensive heart disease with heart failure: Secondary | ICD-10-CM | POA: Insufficient documentation

## 2018-01-08 DIAGNOSIS — Z7901 Long term (current) use of anticoagulants: Secondary | ICD-10-CM | POA: Insufficient documentation

## 2018-01-08 DIAGNOSIS — D6861 Antiphospholipid syndrome: Secondary | ICD-10-CM | POA: Insufficient documentation

## 2018-01-08 DIAGNOSIS — Z79899 Other long term (current) drug therapy: Secondary | ICD-10-CM | POA: Insufficient documentation

## 2018-01-08 LAB — CBC WITH DIFFERENTIAL/PLATELET
ABS IMMATURE GRANULOCYTES: 0.01 10*3/uL (ref 0.00–0.07)
BASOS ABS: 0 10*3/uL (ref 0.0–0.1)
Basophils Relative: 1 %
Eosinophils Absolute: 0.1 10*3/uL (ref 0.0–0.5)
Eosinophils Relative: 2 %
HEMATOCRIT: 41.1 % (ref 39.0–52.0)
HEMOGLOBIN: 14 g/dL (ref 13.0–17.0)
Immature Granulocytes: 0 %
LYMPHS PCT: 20 %
Lymphs Abs: 0.8 10*3/uL (ref 0.7–4.0)
MCH: 32.3 pg (ref 26.0–34.0)
MCHC: 34.1 g/dL (ref 30.0–36.0)
MCV: 94.9 fL (ref 80.0–100.0)
MONO ABS: 0.4 10*3/uL (ref 0.1–1.0)
Monocytes Relative: 10 %
NEUTROS ABS: 2.8 10*3/uL (ref 1.7–7.7)
NRBC: 0 % (ref 0.0–0.2)
Neutrophils Relative %: 67 %
Platelets: 65 10*3/uL — ABNORMAL LOW (ref 150–400)
RBC: 4.33 MIL/uL (ref 4.22–5.81)
RDW: 15.3 % (ref 11.5–15.5)
WBC: 4.1 10*3/uL (ref 4.0–10.5)

## 2018-01-08 LAB — COMPREHENSIVE METABOLIC PANEL
ALT: 25 U/L (ref 0–44)
AST: 27 U/L (ref 15–41)
Albumin: 4.2 g/dL (ref 3.5–5.0)
Alkaline Phosphatase: 98 U/L (ref 38–126)
Anion gap: 9 (ref 5–15)
BUN: 15 mg/dL (ref 8–23)
CHLORIDE: 105 mmol/L (ref 98–111)
CO2: 25 mmol/L (ref 22–32)
Calcium: 9.1 mg/dL (ref 8.9–10.3)
Creatinine, Ser: 0.81 mg/dL (ref 0.61–1.24)
GFR calc Af Amer: >60 mL/min (ref >60)
Glucose, Bld: 143 mg/dL — ABNORMAL HIGH (ref 70–99)
Potassium: 3.4 mmol/L — ABNORMAL LOW (ref 3.5–5.1)
SODIUM: 139 mmol/L (ref 135–145)
Total Bilirubin: 1.5 mg/dL — ABNORMAL HIGH (ref 0.3–1.2)
Total Protein: 7.2 g/dL (ref 6.5–8.1)

## 2018-01-08 LAB — IMMATURE PLATELET FRACTION: IMMATURE PLATELET FRACTION: 3.2 % (ref 1.2–8.6)

## 2018-01-12 NOTE — Telephone Encounter (Signed)
Called [employer] to check status of application.  Patient was not approved for assistance due to not meeting 4% out of pocket spent.  Patient did pick up a prescription from [facility] on 01/01/18 and his copay was $23.54.  I will run a test claim on 01/16/18 to check his copay for the new year.

## 2018-01-15 ENCOUNTER — Inpatient Hospital Stay: Payer: Medicare PPO

## 2018-01-15 DIAGNOSIS — I214 Non-ST elevation (NSTEMI) myocardial infarction: Secondary | ICD-10-CM | POA: Diagnosis not present

## 2018-01-15 DIAGNOSIS — R072 Precordial pain: Secondary | ICD-10-CM | POA: Diagnosis not present

## 2018-01-15 DIAGNOSIS — C9110 Chronic lymphocytic leukemia of B-cell type not having achieved remission: Secondary | ICD-10-CM

## 2018-01-15 LAB — COMPREHENSIVE METABOLIC PANEL
ALT: 35 U/L (ref 0–44)
AST: 39 U/L (ref 15–41)
Albumin: 4.5 g/dL (ref 3.5–5.0)
Alkaline Phosphatase: 111 U/L (ref 38–126)
Anion gap: 11 (ref 5–15)
BUN: 14 mg/dL (ref 8–23)
CO2: 26 mmol/L (ref 22–32)
Calcium: 9.1 mg/dL (ref 8.9–10.3)
Chloride: 103 mmol/L (ref 98–111)
Creatinine, Ser: 0.86 mg/dL (ref 0.61–1.24)
GFR calc Af Amer: >60 mL/min (ref >60)
GFR calc non Af Amer: >60 mL/min (ref >60)
Glucose, Bld: 105 mg/dL — ABNORMAL HIGH (ref 70–99)
Potassium: 3.2 mmol/L — ABNORMAL LOW (ref 3.5–5.1)
Sodium: 140 mmol/L (ref 135–145)
Total Bilirubin: 1.5 mg/dL — ABNORMAL HIGH (ref 0.3–1.2)
Total Protein: 7.6 g/dL (ref 6.5–8.1)

## 2018-01-15 LAB — CBC WITH DIFFERENTIAL/PLATELET
ABS IMMATURE GRANULOCYTES: 0.01 10*3/uL (ref 0.00–0.07)
BASOS PCT: 1 %
Basophils Absolute: 0 10*3/uL (ref 0.0–0.1)
Eosinophils Absolute: 0.1 10*3/uL (ref 0.0–0.5)
Eosinophils Relative: 2 %
HCT: 44 % (ref 39.0–52.0)
Hemoglobin: 14.6 g/dL (ref 13.0–17.0)
Immature Granulocytes: 0 %
Lymphocytes Relative: 24 %
Lymphs Abs: 1 10*3/uL (ref 0.7–4.0)
MCH: 32.1 pg (ref 26.0–34.0)
MCHC: 33.2 g/dL (ref 30.0–36.0)
MCV: 96.7 fL (ref 80.0–100.0)
Monocytes Absolute: 0.4 10*3/uL (ref 0.1–1.0)
Monocytes Relative: 9 %
NEUTROS ABS: 2.6 10*3/uL (ref 1.7–7.7)
Neutrophils Relative %: 64 %
PLATELETS: 67 10*3/uL — AB (ref 150–400)
RBC: 4.55 MIL/uL (ref 4.22–5.81)
RDW: 14.2 % (ref 11.5–15.5)
WBC: 4.2 10*3/uL (ref 4.0–10.5)
nRBC: 0 % (ref 0.0–0.2)

## 2018-01-16 ENCOUNTER — Other Ambulatory Visit: Payer: Self-pay

## 2018-01-16 DIAGNOSIS — D696 Thrombocytopenia, unspecified: Secondary | ICD-10-CM

## 2018-01-16 NOTE — Telephone Encounter (Signed)
Ran a test claim today to check Xarelto copay and it is $47.  Patient will have to meet out of pocket of 4% before he can receive assistance.

## 2018-01-18 ENCOUNTER — Inpatient Hospital Stay
Admission: EM | Admit: 2018-01-18 | Discharge: 2018-01-20 | DRG: 281 | Disposition: A | Discharge status: Home / Self Care | Payer: Medicare PPO | Attending: Internal Medicine | Admitting: Internal Medicine

## 2018-01-18 ENCOUNTER — Emergency Department: Payer: Medicare PPO

## 2018-01-18 ENCOUNTER — Other Ambulatory Visit: Payer: Self-pay

## 2018-01-18 DIAGNOSIS — I25119 Atherosclerotic heart disease of native coronary artery with unspecified angina pectoris: Secondary | ICD-10-CM | POA: Diagnosis present

## 2018-01-18 DIAGNOSIS — M069 Rheumatoid arthritis, unspecified: Secondary | ICD-10-CM | POA: Diagnosis present

## 2018-01-18 DIAGNOSIS — Z79899 Other long term (current) drug therapy: Secondary | ICD-10-CM

## 2018-01-18 DIAGNOSIS — R072 Precordial pain: Secondary | ICD-10-CM | POA: Diagnosis present

## 2018-01-18 DIAGNOSIS — I25810 Atherosclerosis of coronary artery bypass graft(s) without angina pectoris: Secondary | ICD-10-CM | POA: Diagnosis present

## 2018-01-18 DIAGNOSIS — E1151 Type 2 diabetes mellitus with diabetic peripheral angiopathy without gangrene: Secondary | ICD-10-CM | POA: Diagnosis present

## 2018-01-18 DIAGNOSIS — D6861 Antiphospholipid syndrome: Secondary | ICD-10-CM | POA: Diagnosis present

## 2018-01-18 DIAGNOSIS — Z86718 Personal history of other venous thrombosis and embolism: Secondary | ICD-10-CM

## 2018-01-18 DIAGNOSIS — Z951 Presence of aortocoronary bypass graft: Secondary | ICD-10-CM

## 2018-01-18 DIAGNOSIS — Z888 Allergy status to other drugs, medicaments and biological substances status: Secondary | ICD-10-CM

## 2018-01-18 DIAGNOSIS — I252 Old myocardial infarction: Secondary | ICD-10-CM

## 2018-01-18 DIAGNOSIS — I110 Hypertensive heart disease with heart failure: Secondary | ICD-10-CM | POA: Diagnosis present

## 2018-01-18 DIAGNOSIS — I4891 Unspecified atrial fibrillation: Secondary | ICD-10-CM | POA: Diagnosis present

## 2018-01-18 DIAGNOSIS — Z881 Allergy status to other antibiotic agents status: Secondary | ICD-10-CM

## 2018-01-18 DIAGNOSIS — D696 Thrombocytopenia, unspecified: Secondary | ICD-10-CM | POA: Diagnosis present

## 2018-01-18 DIAGNOSIS — E785 Hyperlipidemia, unspecified: Secondary | ICD-10-CM | POA: Diagnosis present

## 2018-01-18 DIAGNOSIS — G4733 Obstructive sleep apnea (adult) (pediatric): Secondary | ICD-10-CM | POA: Diagnosis present

## 2018-01-18 DIAGNOSIS — K219 Gastro-esophageal reflux disease without esophagitis: Secondary | ICD-10-CM | POA: Diagnosis present

## 2018-01-18 DIAGNOSIS — E538 Deficiency of other specified B group vitamins: Secondary | ICD-10-CM | POA: Diagnosis present

## 2018-01-18 DIAGNOSIS — C9110 Chronic lymphocytic leukemia of B-cell type not having achieved remission: Secondary | ICD-10-CM | POA: Diagnosis present

## 2018-01-18 DIAGNOSIS — Z955 Presence of coronary angioplasty implant and graft: Secondary | ICD-10-CM | POA: Diagnosis not present

## 2018-01-18 DIAGNOSIS — N400 Enlarged prostate without lower urinary tract symptoms: Secondary | ICD-10-CM | POA: Diagnosis present

## 2018-01-18 DIAGNOSIS — Z8042 Family history of malignant neoplasm of prostate: Secondary | ICD-10-CM

## 2018-01-18 DIAGNOSIS — Z7951 Long term (current) use of inhaled steroids: Secondary | ICD-10-CM

## 2018-01-18 DIAGNOSIS — Z8249 Family history of ischemic heart disease and other diseases of the circulatory system: Secondary | ICD-10-CM

## 2018-01-18 DIAGNOSIS — K449 Diaphragmatic hernia without obstruction or gangrene: Secondary | ICD-10-CM | POA: Diagnosis present

## 2018-01-18 DIAGNOSIS — I5022 Chronic systolic (congestive) heart failure: Secondary | ICD-10-CM | POA: Diagnosis present

## 2018-01-18 DIAGNOSIS — I214 Non-ST elevation (NSTEMI) myocardial infarction: Principal | ICD-10-CM | POA: Diagnosis present

## 2018-01-18 DIAGNOSIS — E876 Hypokalemia: Secondary | ICD-10-CM | POA: Diagnosis present

## 2018-01-18 DIAGNOSIS — F17210 Nicotine dependence, cigarettes, uncomplicated: Secondary | ICD-10-CM | POA: Diagnosis present

## 2018-01-18 DIAGNOSIS — Z880 Allergy status to penicillin: Secondary | ICD-10-CM

## 2018-01-18 DIAGNOSIS — Z833 Family history of diabetes mellitus: Secondary | ICD-10-CM

## 2018-01-18 DIAGNOSIS — Z9049 Acquired absence of other specified parts of digestive tract: Secondary | ICD-10-CM

## 2018-01-18 DIAGNOSIS — Z9079 Acquired absence of other genital organ(s): Secondary | ICD-10-CM

## 2018-01-18 DIAGNOSIS — Z7901 Long term (current) use of anticoagulants: Secondary | ICD-10-CM

## 2018-01-18 LAB — BASIC METABOLIC PANEL
Anion gap: 10 (ref 5–15)
BUN: 20 mg/dL (ref 8–23)
CO2: 24 mmol/L (ref 22–32)
CREATININE: 0.93 mg/dL (ref 0.61–1.24)
Calcium: 9.1 mg/dL (ref 8.9–10.3)
Chloride: 103 mmol/L (ref 98–111)
GFR calc Af Amer: >60 mL/min (ref >60)
GFR calc non Af Amer: >60 mL/min (ref >60)
Glucose, Bld: 153 mg/dL — ABNORMAL HIGH (ref 70–99)
Potassium: 3.5 mmol/L (ref 3.5–5.1)
Sodium: 137 mmol/L (ref 135–145)

## 2018-01-18 LAB — CBC
HCT: 43.5 % (ref 39.0–52.0)
Hemoglobin: 14.7 g/dL (ref 13.0–17.0)
MCH: 32.6 pg (ref 26.0–34.0)
MCHC: 33.8 g/dL (ref 30.0–36.0)
MCV: 96.5 fL (ref 80.0–100.0)
Platelets: 83 10*3/uL — ABNORMAL LOW (ref 150–400)
RBC: 4.51 MIL/uL (ref 4.22–5.81)
RDW: 14.1 % (ref 11.5–15.5)
WBC: 6.2 10*3/uL (ref 4.0–10.5)
nRBC: 0 % (ref 0.0–0.2)

## 2018-01-18 LAB — TROPONIN I
Troponin I: 0.05 ng/mL (ref <0.03)
Troponin I: 0.63 ng/mL (ref <0.03)
Troponin I: 3.07 ng/mL (ref <0.03)

## 2018-01-18 LAB — PROTIME-INR
INR: 0.99
Prothrombin Time: 13 seconds (ref 11.4–15.2)

## 2018-01-18 LAB — APTT: aPTT: 29 seconds (ref 24–36)

## 2018-01-18 MED ORDER — IPRATROPIUM-ALBUTEROL 20-100 MCG/ACT IN AERS: 1.0000 | INHALATION_SPRAY | Freq: Four times a day (QID) | RESPIRATORY_TRACT | Status: DC

## 2018-01-18 MED ORDER — ATORVASTATIN CALCIUM 20 MG PO TABS
80.0000 mg | ORAL_TABLET | Freq: Every day | ORAL | Status: DC
  Administered 2018-01-19 – 2018-01-20 (×2): 80 mg via ORAL
  Filled 2018-01-18 (×2): qty 4

## 2018-01-18 MED ORDER — SODIUM CHLORIDE 0.9 % IV SOLN
INTRAVENOUS | Status: DC
  Administered 2018-01-18: 21:00:00 via INTRAVENOUS

## 2018-01-18 MED ORDER — POLYETHYL GLYCOL-PROPYL GLYCOL 0.4-0.3 % OP SOLN: 1.0000 [drp] | Freq: Every day | OPHTHALMIC | Status: DC | PRN

## 2018-01-18 MED ORDER — AMLODIPINE BESYLATE 10 MG PO TABS
10.0000 mg | ORAL_TABLET | Freq: Every day | ORAL | Status: DC
  Administered 2018-01-19 – 2018-01-20 (×2): 10 mg via ORAL
  Filled 2018-01-18 (×2): qty 1

## 2018-01-18 MED ORDER — POTASSIUM CHLORIDE CRYS ER 20 MEQ PO TBCR
20.0000 meq | EXTENDED_RELEASE_TABLET | Freq: Every day | ORAL | Status: DC
  Administered 2018-01-19 – 2018-01-20 (×2): 20 meq via ORAL
  Filled 2018-01-18 (×2): qty 1

## 2018-01-18 MED ORDER — FLUTICASONE PROPIONATE 50 MCG/ACT NA SUSP
2.0000 | Freq: Every day | NASAL | Status: DC
  Filled 2018-01-18: qty 16

## 2018-01-18 MED ORDER — IOHEXOL 350 MG/ML SOLN
100.0000 mL | Freq: Once | INTRAVENOUS | Status: AC | PRN
  Administered 2018-01-18: 100 mL via INTRAVENOUS

## 2018-01-18 MED ORDER — ALLOPURINOL 300 MG PO TABS
300.0000 mg | ORAL_TABLET | Freq: Every day | ORAL | Status: DC
  Administered 2018-01-19 – 2018-01-20 (×2): 300 mg via ORAL
  Filled 2018-01-18 (×2): qty 1

## 2018-01-18 MED ORDER — POLYVINYL ALCOHOL 1.4 % OP SOLN
1.0000 [drp] | Freq: Every day | OPHTHALMIC | Status: DC | PRN
  Filled 2018-01-18: qty 15

## 2018-01-18 MED ORDER — PANTOPRAZOLE SODIUM 40 MG PO TBEC
40.0000 mg | DELAYED_RELEASE_TABLET | Freq: Every day | ORAL | Status: DC
  Filled 2018-01-18 (×2): qty 1

## 2018-01-18 MED ORDER — VITAMIN B-12 100 MCG PO TABS
100.0000 ug | ORAL_TABLET | Freq: Every day | ORAL | Status: DC
  Administered 2018-01-19: 100 ug via ORAL
  Filled 2018-01-18 (×2): qty 1

## 2018-01-18 MED ORDER — HYDROXYCHLOROQUINE SULFATE 200 MG PO TABS
200.0000 mg | ORAL_TABLET | ORAL | Status: DC
  Administered 2018-01-19: 200 mg via ORAL
  Filled 2018-01-18: qty 1

## 2018-01-18 MED ORDER — HEPARIN (PORCINE) 25000 UT/250ML-% IV SOLN
1200.0000 [IU]/h | INTRAVENOUS | Status: DC
  Administered 2018-01-18: 1200 [IU]/h via INTRAVENOUS
  Filled 2018-01-18: qty 250

## 2018-01-18 MED ORDER — HEPARIN BOLUS VIA INFUSION
2000.0000 [IU] | Freq: Once | INTRAVENOUS | Status: AC
  Administered 2018-01-18: 2000 [IU] via INTRAVENOUS
  Filled 2018-01-18: qty 2000

## 2018-01-18 MED ORDER — ACETAMINOPHEN 325 MG PO TABS
650.0000 mg | ORAL_TABLET | ORAL | Status: DC | PRN
  Administered 2018-01-19: 650 mg via ORAL
  Filled 2018-01-18: qty 2

## 2018-01-18 MED ORDER — NITROGLYCERIN 0.4 MG SL SUBL: 0.4000 mg | SUBLINGUAL_TABLET | SUBLINGUAL | Status: DC | PRN

## 2018-01-18 MED ORDER — IPRATROPIUM-ALBUTEROL 0.5-2.5 (3) MG/3ML IN SOLN
3.0000 mL | Freq: Four times a day (QID) | RESPIRATORY_TRACT | Status: DC
  Administered 2018-01-18: 3 mL via RESPIRATORY_TRACT
  Filled 2018-01-18 (×4): qty 3

## 2018-01-18 MED ORDER — METOPROLOL SUCCINATE ER 100 MG PO TB24
100.0000 mg | ORAL_TABLET | Freq: Every day | ORAL | Status: DC
  Administered 2018-01-19 – 2018-01-20 (×2): 100 mg via ORAL
  Filled 2018-01-18 (×2): qty 1

## 2018-01-18 MED ORDER — GABAPENTIN 300 MG PO CAPS
300.0000 mg | ORAL_CAPSULE | Freq: Two times a day (BID) | ORAL | Status: DC
  Administered 2018-01-19 – 2018-01-20 (×3): 300 mg via ORAL
  Filled 2018-01-18 (×3): qty 1

## 2018-01-18 MED ORDER — ASPIRIN 81 MG PO CHEW: 324.0000 mg | CHEWABLE_TABLET | ORAL | Status: AC

## 2018-01-18 MED ORDER — ASPIRIN 300 MG RE SUPP: 300.0000 mg | RECTAL | Status: AC

## 2018-01-18 MED ORDER — ALBUTEROL SULFATE (2.5 MG/3ML) 0.083% IN NEBU: 2.5000 mg | INHALATION_SOLUTION | Freq: Four times a day (QID) | RESPIRATORY_TRACT | Status: DC | PRN

## 2018-01-18 MED ORDER — ASPIRIN EC 81 MG PO TBEC
81.0000 mg | DELAYED_RELEASE_TABLET | Freq: Every day | ORAL | Status: DC
  Administered 2018-01-19: 81 mg via ORAL
  Filled 2018-01-18: qty 1

## 2018-01-18 MED ORDER — ONDANSETRON HCL 4 MG/2ML IJ SOLN: 4.0000 mg | Freq: Four times a day (QID) | INTRAMUSCULAR | Status: DC | PRN

## 2018-01-18 MED ORDER — ISOSORBIDE MONONITRATE ER 30 MG PO TB24
30.0000 mg | ORAL_TABLET | Freq: Every day | ORAL | Status: DC
  Administered 2018-01-19: 30 mg via ORAL
  Filled 2018-01-18: qty 1

## 2018-01-18 NOTE — ED Notes (Signed)
Date and time results received: 01/18/18 1:44 PM  (use smartphrase ".now" to insert current time)  Test: Trop Critical Value: 0.05  Name of Provider Notified: Dr. Burlene Arnt  Orders Received? Or Actions Taken?: Critical Result Acknowledged

## 2018-01-18 NOTE — Progress Notes (Signed)
ANTICOAGULATION CONSULT NOTE - Initial Consult  Pharmacy Consult for heparin Indication: chest pain/ACS  Allergies  Allergen Reactions  . Ace Inhibitors Other (See Comments)    Other reaction(s): Cough  . Pravastatin Other (See Comments)    Other reaction(s): Muscle Pain  . Rosuvastatin Other (See Comments)    Other reaction(s): Other (See Comments) GI bleed  . Simvastatin Other (See Comments)    Other reaction(s): Muscle Pain  . Amoxicillin Rash  . Niacin Rash  . Penicillins Rash    Has patient had a PCN reaction causing immediate rash, facial/tongue/throat swelling, SOB or lightheadedness with hypotension: Yes Has patient had a PCN reaction causing severe rash involving mucus membranes or skin necrosis: Unknown Has patient had a PCN reaction that required hospitalization: Unknown Has patient had a PCN reaction occurring within the last 10 years: No If all of the above answers are "NO", then may proceed with Cephalosporin use.    Patient Measurements: Height: 6' (182.9 cm) Weight: 213 lb (96.6 kg) IBW/kg (Calculated) : 77.6 Heparin Dosing Weight: 96.6 kg  Vital Signs: Temp: 98.9 F (37.2 C) (01/12 1303) Temp Source: Oral (01/12 1303) BP: 125/96 (01/12 1600) Pulse Rate: 69 (01/12 1600)  Labs: Recent Labs    01/18/18 1308 01/18/18 1619  HGB 14.7  --   HCT 43.5  --   PLT 83*  --   CREATININE 0.93  --   TROPONINI 0.05* 0.63*    Estimated Creatinine Clearance: 89.1 mL/min (by C-G formula based on SCr of 0.93 mg/dL).   Medical History: Past Medical History:  Diagnosis Date  . AAA (abdominal aortic aneurysm) without rupture ([place]) 04/05/2014  . AAA (abdominal aortic aneurysm) without rupture ([place]) 04/05/2014  . Abdominal aortic aneurysm without rupture ([place])   . Acute non-ST elevation myocardial infarction (NSTEMI) ([place]) 08/15/2014  . 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/28/2014  . APS (antiphospholipid syndrome) ([place])   . Arteriosclerosis of coronary artery  04/05/2014   Overview:  Sp cabg with lima to lad svg to d1 om1 rca 2004 with occluded svg to rca and d1 and pci stetn of om1 graft 2015   . Arthritis   . B12 deficiency 03/21/2017  . Barrett's esophagus   . Benign essential HTN 05/02/2014  . BPH (benign prostatic hyperplasia)   . CAD (coronary artery disease)   . CHF (congestive heart failure) ([place])   . Chronic systolic heart failure ([place]) 04/19/2014   Overview:  With segmental LV systolic dysfunction ejection fraction of 35%   . CLL (chronic lymphocytic leukemia) ([place])   . CLL (chronic lymphocytic leukemia) ([place]) 05/24/2017  . DVT (deep venous thrombosis) ([place])   . Dysrhythmia    ATRIAL FIB.  Marland Kitchen GERD (gastroesophageal reflux disease)   . History of hiatal hernia   . History of shingles 2013  . Hyperlipemia   . Hyperplastic polyps of stomach   . Hypertension   . Myocardial infarction ([place]) 07/30/2014  . NSTEMI (non-ST elevated myocardial infarction) ([place])   . OSA (obstructive sleep apnea)   . Osteoarthritis   . Pre-diabetes   . PVD (peripheral vascular disease) ([place])   . RA (rheumatoid arthritis) ([place])   . SOB (shortness of breath)     Assessment: 73 year old male with chest pain. Patient previously on Xarelto PTA but reports not taking for approximately 2 weeks. Per note from EDP, patient taken off of all anticoagulants and antiplatelets recently due to thrombocytopenia.  Goal of Therapy:  Heparin level 0.3-0.7 units/ml Monitor platelets by anticoagulation protocol:  Yes   Plan:  Plt 83 on admission. Confirmed with Dr. Joni Fears that we will do a half bolus due to low platelets. Will give heparin 2000 unit bolus followed by heparin drip at 1200 units/hr. Heparin level ordered for 1/13 at 0000. Will continue to monitor CBC daily while on heparin.  Tawnya Crook, PharmD Pharmacy Resident  01/18/2018 5:42 PM

## 2018-01-18 NOTE — ED Notes (Signed)
This RN gave report to receiving floor RN Colletta [place] who informed me that per her charge pt could not be transported until after shift change.

## 2018-01-18 NOTE — ED Provider Notes (Signed)
[facility] Emergency Department Provider Note  ____________________________________________  Time seen: Approximately 5:14 PM  I have reviewed the triage vital signs and the nursing notes.   HISTORY  Chief Complaint Chest Pain    HPI Martin Adkins is a 73 y.o. male with a history of AAA status post stent graft repair, non-STEMI, CLL, antiphospholipid syndrome, DVT and recent thrombocytopenia that has necessitated the discontinuation of all anticoagulant and antiplatelet medications he had been on who comes the ED today complaining of chest pain that started this morning, worse with exertion, better with rest, associated with shortness of breath, radiating to left arm, described as burning.  Mild to moderate in intensity.  Currently about 2/10.      Past Medical History:  Diagnosis Date  . AAA (abdominal aortic aneurysm) without rupture ([place]) 04/05/2014  . AAA (abdominal aortic aneurysm) without rupture ([place]) 04/05/2014  . Abdominal aortic aneurysm without rupture ([place])   . Acute non-ST elevation myocardial infarction (NSTEMI) ([place]) 08/15/2014  . 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/28/2014  . APS (antiphospholipid syndrome) ([place])   . Arteriosclerosis of coronary artery 04/05/2014   Overview:  Sp cabg with lima to lad svg to d1 om1 rca 2004 with occluded svg to rca and d1 and pci stetn of om1 graft 2015   . Arthritis   . B12 deficiency 03/21/2017  . Barrett's esophagus   . Benign essential HTN 05/02/2014  . BPH (benign prostatic hyperplasia)   . CAD (coronary artery disease)   . CHF (congestive heart failure) ([place])   . Chronic systolic heart failure ([place]) 04/19/2014   Overview:  With segmental LV systolic dysfunction ejection fraction of 35%   . CLL (chronic lymphocytic leukemia) ([place])   . CLL (chronic lymphocytic leukemia) ([place]) 05/24/2017  . DVT (deep venous thrombosis) ([place])   . Dysrhythmia    ATRIAL FIB.  Marland Kitchen GERD (gastroesophageal reflux disease)   .  History of hiatal hernia   . History of shingles 2013  . Hyperlipemia   . Hyperplastic polyps of stomach   . Hypertension   . Myocardial infarction ([place]) 07/30/2014  . NSTEMI (non-ST elevated myocardial infarction) ([place])   . OSA (obstructive sleep apnea)   . Osteoarthritis   . Pre-diabetes   . PVD (peripheral vascular disease) ([place])   . RA (rheumatoid arthritis) ([place])   . SOB (shortness of breath)      Patient Active Problem List   Diagnosis Date Noted  . AAA (abdominal aortic aneurysm) ([place]) 09/24/2017  . Leaking abdominal aortic aneurysm (AAA) ([place]) 08/22/2017  . CLL (chronic lymphocytic leukemia) ([place]) 05/24/2017  . Goals of care, counseling/discussion 05/24/2017  . B12 deficiency 03/21/2017  . Deep venous thrombosis of profunda femoris vein ([place]) 01/23/2015  . BPH (benign prostatic hyperplasia) 01/23/2015  . Urge urinary incontinence 01/23/2015  . Type 2 diabetes mellitus ([place]) 10/31/2014  . Benign gastric polyp 10/31/2014  . Absence of bladder continence 10/09/2014  . Acute non-ST elevation myocardial infarction (NSTEMI) ([place]) 08/15/2014  . Atrial fibrillation ([place]) 08/05/2014  . Antiphospholipid syndrome ([place]) 08/01/2014  . H/O deep venous thrombosis 08/01/2014  . Myocardial infarction ([place]) 07/30/2014  . Barrett esophagus 07/28/2014  . Diabetes mellitus, type 2 ([place]) 07/28/2014  . 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/28/2014  . Hyperplastic adenomatous polyp of stomach 07/28/2014  . Benign essential HTN 05/02/2014  . Chronic systolic heart failure ([place]) 04/19/2014  . AAA (abdominal aortic aneurysm) without rupture ([place]) 04/05/2014  . Arteriosclerosis of coronary artery 04/05/2014  . Breathlessness on exertion  04/05/2014  . Abdominal aortic aneurysm (AAA) without rupture ([place]) 04/05/2014  . Acute non-ST segment elevation myocardial infarction ([place]) 05/14/2013  . Acid reflux 05/04/2013  . Combined fat and carbohydrate induced hyperlipemia 05/03/2013  . Obstructive  apnea 05/03/2013  . Arthritis, degenerative 05/03/2013  . Peripheral blood vessel disorder ([place]) 05/03/2013  . Arthritis or polyarthritis, rheumatoid ([place]) 05/03/2013  . Compulsive tobacco user syndrome 05/03/2013  . Rheumatoid arthritis ([place]) 05/03/2013  . Peripheral vascular disease ([place]) 05/03/2013  . Current tobacco use 05/03/2013     Past Surgical History:  Procedure Laterality Date  . ABDOMINAL AORTA STENT    . CHOLECYSTECTOMY    . COLONOSCOPY  11/24/2009  . CORONARY ANGIOPLASTY WITH STENT PLACEMENT  2015  . CORONARY ARTERY BYPASS GRAFT    . ENDOVASCULAR REPAIR/STENT GRAFT N/A 09/24/2017   Procedure: ENDOVASCULAR REPAIR/STENT GRAFT;  Surgeon: Algernon Huxley, MD;  Location: [facility];  Service: Cardiovascular;  Laterality: N/A;  . ESOPHAGOGASTRODUODENOSCOPY  05/26/02  03/23/12  . ESOPHAGOGASTRODUODENOSCOPY (EGD) WITH PROPOFOL N/A 10/28/2016   Procedure: ESOPHAGOGASTRODUODENOSCOPY (EGD) WITH PROPOFOL;  Surgeon: Manya Silvas, MD;  Location: [facility] ENDOSCOPY;  Service: Endoscopy;  Laterality: N/A;  . PERIPHERAL VASCULAR CATHETERIZATION N/A 09/06/2014   Procedure: IVC Filter Removal;  Surgeon: Katha Cabal, MD;  Location: [facility];  Service: Cardiovascular;  Laterality: N/A;  . TRANSURETHRAL RESECTION OF PROSTATE N/A 02/20/2015   Procedure: TRANSURETHRAL RESECTION OF THE PROSTATE (TURP);  Surgeon: Hollice Espy, MD;  Location: [facility] ORS;  Service: Urology;  Laterality: N/A;     Prior to Admission medications   Medication Sig Start Date End Date Taking? Authorizing Provider  albuterol (PROVENTIL HFA;VENTOLIN HFA) 108 (90 Base) MCG/ACT inhaler Inhale 2 puffs into the lungs every 6 (six) hours as needed for wheezing or shortness of breath. 02/26/15   Nance Pear, MD  allopurinol (ZYLOPRIM) 300 MG tablet Take 1 tablet (300 mg total) by mouth daily. 09/17/17   Earlie Server, MD  amLODipine (NORVASC) 10 MG tablet Take 10 mg by mouth daily.  11/19/14   [provider]  atorvastatin (LIPITOR) 80 MG tablet TAKE 1 TABLET (80 MG TOTAL) BY MOUTH ONCE DAILY. 08/26/14   [provider]  fluticasone (FLONASE) 50 MCG/ACT nasal spray Place 2 sprays into both nostrils daily.  05/24/14   [provider]  furosemide (LASIX) 20 MG tablet Take 20 mg by mouth daily. 02/14/17   [provider]  gabapentin (NEURONTIN) 300 MG capsule Take 1 capsule (300 mg total) by mouth 3 (three) times daily. 06/03/17   Earlie Server, MD  hydroxychloroquine (PLAQUENIL) 200 MG tablet Take 1 tablet by mouth daily. 06/27/16   [provider]  Ipratropium-Albuterol (COMBIVENT) 20-100 MCG/ACT AERS respimat Inhale 1 puff into the lungs every 6 (six) hours. 05/26/17   Verlon Au, NP  isosorbide mononitrate (IMDUR) 30 MG 24 hr tablet Take 30 mg by mouth daily.  07/25/14   [provider]  losartan (COZAAR) 100 MG tablet 100 mg daily.  02/12/17   [provider]  LUBRICANT EYE DROPS 0.4-0.3 % SOLN Apply 1 drop to eye daily as needed.  08/24/14   [provider]  metoprolol succinate (TOPROL-XL) 100 MG 24 hr tablet Take 100 mg by mouth daily.  05/17/14   [provider]  montelukast (SINGULAIR) 10 MG tablet Take 1 tablet (10 mg total) by mouth as needed. Take before Gazyva infusion. 05/27/17   Earlie Server, MD  pantoprazole (PROTONIX) 40 MG tablet  Take 40 mg by mouth daily.  07/07/14   [provider]  potassium chloride SA (K-DUR,KLOR-CON) 20 MEQ tablet Take 20 mEq by mouth daily.  07/25/14   [provider]  rivaroxaban (XARELTO) 20 MG TABS tablet Take 1 tablet (20 mg total) by mouth daily with supper. 12/24/17   Earlie Server, MD  venetoclax 100 MG TABS Take 400 mg by mouth daily. Take with food and a full glass of water. 09/30/17   Earlie Server, MD  vitamin B-12 (CYANOCOBALAMIN) 100 MCG tablet Take 1 tablet (100 mcg total) by mouth daily. 10/24/17   Earlie Server, MD     Allergies Ace inhibitors; Pravastatin; Rosuvastatin;  Simvastatin; Amoxicillin; Niacin; and Penicillins   Family History  Problem Relation Age of Onset  . Cancer Mother 18       breast ca  . Diabetes Mother   . Coronary artery disease Father   . Heart attack Father   . Prostate cancer Brother   . Bladder Cancer Neg Hx   . Kidney cancer Neg Hx     Social History Social History   Tobacco Use  . Smoking status: Current Some Day Smoker    Packs/day: 0.50    Years: 20.00    Pack years: 10.00    Types: Cigarettes  . Smokeless tobacco: Never Used  . Tobacco comment: per pt nicotine patch number7mg  -smokes 1-3 cig/per d now  Substance Use Topics  . Alcohol use: Yes    Alcohol/week: 0.0 standard drinks    Comment: social on weekends  . Drug use: No    Review of Systems  Constitutional:   No fever or chills.  ENT:   No sore throat. No rhinorrhea. Cardiovascular: Positive as above chest pain without syncope. Respiratory:   Positive shortness of breath without cough. Gastrointestinal:   Negative for abdominal pain, vomiting and diarrhea.  Musculoskeletal:   Negative for focal pain or swelling All other systems reviewed and are negative except as documented above in ROS and HPI.  ____________________________________________   PHYSICAL EXAM:  VITAL SIGNS: ED Triage Vitals  Enc Vitals Group     BP 01/18/18 1303 (!) 141/87     Pulse Rate 01/18/18 1303 (!) 101     Resp 01/18/18 1303 16     Temp 01/18/18 1303 98.9 F (37.2 C)     Temp Source 01/18/18 1303 Oral     SpO2 01/18/18 1303 96 %     Weight 01/18/18 1306 213 lb (96.6 kg)     Height 01/18/18 1306 6' (1.829 m)     Head Circumference --      Peak Flow --      Pain Score 01/18/18 1306 8     Pain Loc --      Pain Edu? --      Excl. in [place]? --     Vital signs reviewed, nursing assessments reviewed.   Constitutional:   Alert and oriented. Non-toxic appearance. Eyes:   Conjunctivae are normal. EOMI. PERRL. ENT      Head:   Normocephalic and atraumatic.      Nose:    No congestion/rhinnorhea.       Mouth/Throat:   MMM, no pharyngeal erythema. No peritonsillar mass.       Neck:   No meningismus. Full ROM. Hematological/Lymphatic/Immunilogical:   No cervical lymphadenopathy. Cardiovascular:   RRR. Symmetric bilateral radial and DP pulses.  No murmurs. Cap refill less than 2 seconds. Respiratory:   Normal respiratory effort without tachypnea/retractions.  Breath sounds are clear and equal bilaterally. No wheezes/rales/rhonchi. Gastrointestinal:   Soft with mild epigastric tenderness.  Non distended. There is no CVA tenderness.  No rebound, rigidity, or guarding.  Musculoskeletal:   Normal range of motion in all extremities. No joint effusions.  No lower extremity tenderness.  No edema. Neurologic:   Normal speech and language.  Motor grossly intact. No acute focal neurologic deficits are appreciated.  Skin:    Skin is warm, dry and intact. No rash noted.  No petechiae, purpura, or bullae.  ____________________________________________    LABS (pertinent positives/negatives) (all labs ordered are listed, but only abnormal results are displayed) Labs Reviewed  BASIC METABOLIC PANEL - Abnormal; Notable for the following components:      Result Value   Glucose, Bld 153 (*)    All other components within normal limits  CBC - Abnormal; Notable for the following components:   Platelets 83 (*)    All other components within normal limits  TROPONIN I - Abnormal; Notable for the following components:   Troponin I 0.05 (*)    All other components within normal limits  TROPONIN I - Abnormal; Notable for the following components:   Troponin I 0.63 (*)    All other components within normal limits   ____________________________________________   EKG  Interpreted by me Sinus tachycardia rate 102, normal axis, normal intervals.  Normal QRS except for inferior Q waves no acute mild ST depressions in high lateral leads.  Unremarkable T waves.  No STEMI.   Compared to previous EKG from August 2018, the ST depressions appear to be new.  ____________________________________________    RADIOLOGY  Dg Chest 2 View  Result Date: 01/18/2018 CLINICAL DATA:  Shortness of breath. EXAM: CHEST - 2 VIEW COMPARISON:  08/13/2016 FINDINGS: The lungs are clear without focal pneumonia, edema, pneumothorax or pleural effusion. The cardiopericardial silhouette is within normal limits for size. Status post CABG. The visualized bony structures of the thorax are intact. IMPRESSION: No active cardiopulmonary disease. Electronically Signed   By: Misty Stanley M.D.   On: 01/18/2018 14:30    ____________________________________________   PROCEDURES .Critical Care Performed by: Carrie Mew, MD Authorized by: Carrie Mew, MD   Critical care provider statement:    Critical care time (minutes):  35   Critical care time was exclusive of:  Separately billable procedures and treating other patients   Critical care was necessary to treat or prevent imminent or life-threatening deterioration of the following conditions:  Cardiac failure   Critical care was time spent personally by me on the following activities:  Development of treatment plan with patient or surrogate, discussions with consultants, evaluation of patient's response to treatment, examination of patient, obtaining history from patient or surrogate, ordering and performing treatments and interventions, ordering and review of laboratory studies, ordering and review of radiographic studies, pulse oximetry, re-evaluation of patient's condition and review of old charts    ____________________________________________  DIFFERENTIAL DIAGNOSIS   Non-STEMI, PE with right heart strain, aortic dissection, enlarging aortic aneurysm  CLINICAL IMPRESSION / ASSESSMENT AND PLAN / ED COURSE  Pertinent labs & imaging results that were available during my care of the patient were reviewed by me and considered in  my medical decision making (see chart for details).    Patient presents with concerning chest pain and some minimal EKG changes.  Concerned about non-STEMI, not unstable angina.  With his medical history, he is also at high risk of PE or aortic complications so I am  sending him for CT scans.  Initial troponin 0 0.05, will plan a serial troponin.  Clinical Course as of Jan 19 1712  Sun Jan 18, 2018  1709 Likely non-STEMI discussed with Dr. Ubaldo Glassing who agrees with heparin but recommends avoiding antiplatelet medications at this time.   [PS]  7741 No obvious PE or aortic contrast extrav on CT scan by my viewing. Will f/u rad report   [PS]    Clinical Course User Index [PS] Carrie Mew, MD     ----------------------------------------- 5:18 PM on 01/18/2018 -----------------------------------------  Repeat troponin increased to 0.6, indicative of non-STEMI.  Heparin ordered as discussed with Dr. Ubaldo Glassing..  ____________________________________________   FINAL CLINICAL IMPRESSION(S) / ED DIAGNOSES    Final diagnoses:  NSTEMI (non-ST elevated myocardial infarction) ([place])  Precordial pain     ED Discharge Orders    None      Portions of this note were generated with dragon dictation software. Dictation errors may occur despite best attempts at proofreading.   Carrie Mew, MD 01/18/18 1718

## 2018-01-18 NOTE — ED Notes (Signed)
Pt given sandwich tray and ginger ale. 

## 2018-01-18 NOTE — ED Triage Notes (Signed)
Pt reports that this am he started with shortness of breath followed by burning sensation in chest that started to radiate down left arm - he is now experiencing tightness in chest - denies N/V - denies dizziness

## 2018-01-18 NOTE — H&P (Signed)
[facility] NAME: Martin Adkins    MR#:  [id number]  DATE OF BIRTH:  1945-10-13  DATE OF ADMISSION:  01/18/2018  PRIMARY CARE PHYSICIAN: [name], MD   REQUESTING/REFERRING PHYSICIAN:   CHIEF COMPLAINT:   Chief Complaint  Patient presents with  . Chest Pain    HISTORY OF PRESENT ILLNESS: Martin Adkins  is a 73 y.o. male with a known history of coronary artery disease with stent in the past, essential hypertension, BPH, chronic systolic congestive heart failure, CLL and thrombocytopenia, sleep apnea presenting with chest pain since this morning.  Patient states that the pain is on the left side of his chest dull ache.  Worst with activity but also occurs at rest.  He also has shortness of breath.  Also pain radiating to the left arm.  Denies any pain radiation to his jaw.  No nausea vomiting. PAST MEDICAL HISTORY:   Past Medical History:  Diagnosis Date  . AAA (abdominal aortic aneurysm) without rupture ([place]) 04/05/2014  . AAA (abdominal aortic aneurysm) without rupture ([place]) 04/05/2014  . Abdominal aortic aneurysm without rupture ([place])   . Acute non-ST elevation myocardial infarction (NSTEMI) ([place]) 08/15/2014  . 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/28/2014  . APS (antiphospholipid syndrome) ([place])   . Arteriosclerosis of coronary artery 04/05/2014   Overview:  Sp cabg with lima to lad svg to d1 om1 rca 2004 with occluded svg to rca and d1 and pci stetn of om1 graft 2015   . Arthritis   . B12 deficiency 03/21/2017  . Barrett's esophagus   . Benign essential HTN 05/02/2014  . BPH (benign prostatic hyperplasia)   . CAD (coronary artery disease)   . CHF (congestive heart failure) ([place])   . Chronic systolic heart failure ([place]) 04/19/2014   Overview:  With segmental LV systolic dysfunction ejection fraction of 35%   . CLL (chronic lymphocytic leukemia) ([place])   . CLL (chronic lymphocytic leukemia) ([place]) 05/24/2017  . DVT (deep venous  thrombosis) ([place])   . Dysrhythmia    ATRIAL FIB.  Marland Kitchen GERD (gastroesophageal reflux disease)   . History of hiatal hernia   . History of shingles 2013  . Hyperlipemia   . Hyperplastic polyps of stomach   . Hypertension   . Myocardial infarction ([place]) 07/30/2014  . NSTEMI (non-ST elevated myocardial infarction) ([place])   . OSA (obstructive sleep apnea)   . Osteoarthritis   . Pre-diabetes   . PVD (peripheral vascular disease) ([place])   . RA (rheumatoid arthritis) ([place])   . SOB (shortness of breath)     PAST SURGICAL HISTORY:  Past Surgical History:  Procedure Laterality Date  . ABDOMINAL AORTA STENT    . CHOLECYSTECTOMY    . COLONOSCOPY  11/24/2009  . CORONARY ANGIOPLASTY WITH STENT PLACEMENT  2015  . CORONARY ARTERY BYPASS GRAFT    . ENDOVASCULAR REPAIR/STENT GRAFT N/A 09/24/2017   Procedure: ENDOVASCULAR REPAIR/STENT GRAFT;  Surgeon: Algernon Huxley, MD;  Location: [facility];  Service: Cardiovascular;  Laterality: N/A;  . ESOPHAGOGASTRODUODENOSCOPY  05/26/02  03/23/12  . ESOPHAGOGASTRODUODENOSCOPY (EGD) WITH PROPOFOL N/A 10/28/2016   Procedure: ESOPHAGOGASTRODUODENOSCOPY (EGD) WITH PROPOFOL;  Surgeon: Manya Silvas, MD;  Location: [facility]COPY;  Service: Endoscopy;  Laterality: N/A;  . PERIPHERAL VASCULAR CATHETERIZATION N/A 09/06/2014   Procedure: IVC Filter Removal;  Surgeon: Katha Cabal, MD;  Location: [facility];  Service: Cardiovascular;  Laterality: N/A;  . TRANSURETHRAL RESECTION OF PROSTATE N/A  02/20/2015   Procedure: TRANSURETHRAL RESECTION OF THE PROSTATE (TURP);  Surgeon: Hollice Espy, MD;  Location: [facility];  Service: Urology;  Laterality: N/A;    SOCIAL HISTORY:  Social History   Tobacco Use  . Smoking status: Current Some Day Smoker    Packs/day: 0.50    Years: 20.00    Pack years: 10.00    Types: Cigarettes  . Smokeless tobacco: Never Used  . Tobacco comment: per pt nicotine patch number7mg  -smokes 1-3 cig/per d now  Substance Use  Topics  . Alcohol use: Yes    Alcohol/week: 0.0 standard drinks    Comment: social on weekends    FAMILY HISTORY:  Family History  Problem Relation Age of Onset  . Cancer Mother 73       breast ca  . Diabetes Mother   . Coronary artery disease Father   . Heart attack Father   . Prostate cancer Brother   . Bladder Cancer Neg Hx   . Kidney cancer Neg Hx     DRUG ALLERGIES:  Allergies  Allergen Reactions  . Ace Inhibitors Other (See Comments)    Other reaction(s): Cough  . Pravastatin Other (See Comments)    Other reaction(s): Muscle Pain  . Rosuvastatin Other (See Comments)    Other reaction(s): Other (See Comments) GI bleed  . Simvastatin Other (See Comments)    Other reaction(s): Muscle Pain  . Amoxicillin Rash  . Niacin Rash  . Penicillins Rash    Has patient had a PCN reaction causing immediate rash, facial/tongue/throat swelling, SOB or lightheadedness with hypotension: Yes Has patient had a PCN reaction causing severe rash involving mucus membranes or skin necrosis: Unknown Has patient had a PCN reaction that required hospitalization: Unknown Has patient had a PCN reaction occurring within the last 10 years: No If all of the above answers are "NO", then may proceed with Cephalosporin use.    REVIEW OF SYSTEMS:   CONSTITUTIONAL: No fever, fatigue or weakness.  EYES: No blurred or double vision.  EARS, NOSE, AND THROAT: No tinnitus or ear pain.  RESPIRATORY: No cough, shortness of breath, wheezing or hemoptysis.  CARDIOVASCULAR: Positive chest pain, orthopnea, edema.  GASTROINTESTINAL: No nausea, vomiting, diarrhea or abdominal pain.  GENITOURINARY: No dysuria, hematuria.  ENDOCRINE: No polyuria, nocturia,  HEMATOLOGY: No anemia, easy bruising or bleeding SKIN: No rash or lesion. MUSCULOSKELETAL: No joint pain or arthritis.   NEUROLOGIC: No tingling, numbness, weakness.  PSYCHIATRY: No anxiety or depression.   MEDICATIONS AT HOME:  Prior to Admission  medications   Medication Sig Start Date End Date Taking? Authorizing Provider  albuterol (PROVENTIL HFA;VENTOLIN HFA) 108 (90 Base) MCG/ACT inhaler Inhale 2 puffs into the lungs every 6 (six) hours as needed for wheezing or shortness of breath. 02/26/15   Nance Pear, MD  allopurinol (ZYLOPRIM) 300 MG tablet Take 1 tablet (300 mg total) by mouth daily. 09/17/17   Earlie Server, MD  amLODipine (NORVASC) 10 MG tablet Take 10 mg by mouth daily.  11/19/14   [provider]  atorvastatin (LIPITOR) 80 MG tablet TAKE 1 TABLET (80 MG TOTAL) BY MOUTH ONCE DAILY. 08/26/14   [provider]  fluticasone (FLONASE) 50 MCG/ACT nasal spray Place 2 sprays into both nostrils daily.  05/24/14   [provider]  furosemide (LASIX) 20 MG tablet Take 20 mg by mouth daily. 02/14/17   [provider]  gabapentin (NEURONTIN) 300 MG capsule Take 1 capsule (300 mg total) by mouth 3 (three) times daily. 06/03/17  Earlie Server, MD  hydroxychloroquine (PLAQUENIL) 200 MG tablet Take 1 tablet by mouth daily. 06/27/16   [provider]  Ipratropium-Albuterol (COMBIVENT) 20-100 MCG/ACT AERS respimat Inhale 1 puff into the lungs every 6 (six) hours. 05/26/17   Verlon Au, NP  isosorbide mononitrate (IMDUR) 30 MG 24 hr tablet Take 30 mg by mouth daily.  07/25/14   [provider]  losartan (COZAAR) 100 MG tablet 100 mg daily.  02/12/17   [provider]  LUBRICANT EYE DROPS 0.4-0.3 % SOLN Apply 1 drop to eye daily as needed.  08/24/14   [provider]  metoprolol succinate (TOPROL-XL) 100 MG 24 hr tablet Take 100 mg by mouth daily.  05/17/14   [provider]  montelukast (SINGULAIR) 10 MG tablet Take 1 tablet (10 mg total) by mouth as needed. Take before Gazyva infusion. 05/27/17   Earlie Server, MD  pantoprazole (PROTONIX) 40 MG tablet Take 40 mg by mouth daily.  07/07/14   [provider]  potassium chloride SA (K-DUR,KLOR-CON) 20 MEQ tablet Take 20 mEq by  mouth daily.  07/25/14   [provider]  rivaroxaban (XARELTO) 20 MG TABS tablet Take 1 tablet (20 mg total) by mouth daily with supper. 12/24/17   Earlie Server, MD  venetoclax 100 MG TABS Take 400 mg by mouth daily. Take with food and a full glass of water. 09/30/17   Earlie Server, MD  vitamin B-12 (CYANOCOBALAMIN) 100 MCG tablet Take 1 tablet (100 mcg total) by mouth daily. 10/24/17   Earlie Server, MD      PHYSICAL EXAMINATION:   VITAL SIGNS: Blood pressure (!) 125/96, pulse 69, temperature 98.9 F (37.2 C), temperature source Oral, resp. rate (!) 23, height 6' (1.829 m), weight 96.6 kg, SpO2 99 %.  GENERAL:  73 y.o.-year-old patient lying in the bed with no acute distress.  EYES: Pupils equal, round, reactive to light and accommodation. No scleral icterus. Extraocular muscles intact.  HEENT: Head atraumatic, normocephalic. Oropharynx and nasopharynx clear.  NECK:  Supple, no jugular venous distention. No thyroid enlargement, no tenderness.  LUNGS: Normal breath sounds bilaterally, no wheezing, rales,rhonchi or crepitation. No use of accessory muscles of respiration.  CARDIOVASCULAR: S1, S2 normal. No murmurs, rubs, or gallops.  ABDOMEN: Soft, nontender, nondistended. Bowel sounds present. No organomegaly or mass.  EXTREMITIES: No pedal edema, cyanosis, or clubbing.  NEUROLOGIC: Cranial nerves II through XII are intact. Muscle strength 5/5 in all extremities. Sensation intact. Gait not checked.  PSYCHIATRIC: The patient is alert and oriented x 3.  SKIN: No obvious rash, lesion, or ulcer.   LABORATORY PANEL:   CBC Recent Labs  Lab 01/15/18 0828 01/18/18 1308  WBC 4.2 6.2  HGB 14.6 14.7  HCT 44.0 43.5  PLT 67* 83*  MCV 96.7 96.5  MCH 32.1 32.6  MCHC 33.2 33.8  RDW 14.2 14.1  LYMPHSABS 1.0  --   MONOABS 0.4  --   EOSABS 0.1  --   BASOSABS 0.0  --    ------------------------------------------------------------------------------------------------------------------  Chemistries   Recent Labs  Lab 01/15/18 0828 01/18/18 1308  NA 140 137  K 3.2* 3.5  CL 103 103  CO2 26 24  GLUCOSE 105* 153*  BUN 14 20  CREATININE 0.86 0.93  CALCIUM 9.1 9.1  AST 39  --   ALT 35  --   ALKPHOS 111  --   BILITOT 1.5*  --    ------------------------------------------------------------------------------------------------------------------ estimated creatinine clearance is 89.1 mL/min (by C-G formula based on SCr  of 0.93 mg/dL). ------------------------------------------------------------------------------------------------------------------ No results for input(s): TSH, T4TOTAL, T3FREE, THYROIDAB in the last 72 hours.  Invalid input(s): FREET3   Coagulation profile No results for input(s): INR, PROTIME in the last 168 hours. ------------------------------------------------------------------------------------------------------------------- No results for input(s): DDIMER in the last 72 hours. -------------------------------------------------------------------------------------------------------------------  Cardiac Enzymes Recent Labs  Lab 01/18/18 1308 01/18/18 1619  TROPONINI 0.05* 0.63*   ------------------------------------------------------------------------------------------------------------------ Invalid input(s): POCBNP  ---------------------------------------------------------------------------------------------------------------  Urinalysis    Component Value Date/Time   COLORURINE Yellow 07/09/2011 0147   APPEARANCEUR Clear 11/25/2014 0959   LABSPEC 1.023 07/09/2011 0147   PHURINE 7.0 07/09/2011 0147   GLUCOSEU Negative 11/25/2014 0959   GLUCOSEU Negative 07/09/2011 0147   HGBUR Negative 07/09/2011 0147   BILIRUBINUR Negative 11/25/2014 0959   BILIRUBINUR Negative 07/09/2011 0147   KETONESUR Negative 07/09/2011 0147   PROTEINUR 1+ (A) 11/25/2014 0959   PROTEINUR Negative 07/09/2011 0147   NITRITE Negative 11/25/2014 0959   NITRITE Negative  07/09/2011 0147   LEUKOCYTESUR Negative 11/25/2014 0959   LEUKOCYTESUR Negative 07/09/2011 0147     RADIOLOGY: Dg Chest 2 View  Result Date: 01/18/2018 CLINICAL DATA:  Shortness of breath. EXAM: CHEST - 2 VIEW COMPARISON:  08/13/2016 FINDINGS: The lungs are clear without focal pneumonia, edema, pneumothorax or pleural effusion. The cardiopericardial silhouette is within normal limits for size. Status post CABG. The visualized bony structures of the thorax are intact. IMPRESSION: No active cardiopulmonary disease. Electronically Signed   By: Misty Stanley M.D.   On: 01/18/2018 14:30    EKG: Orders placed or performed during the hospital encounter of 01/18/18  . ED EKG within 10 minutes  . ED EKG within 10 minutes    IMPRESSION AND PLAN: Patient is a 73 year old African-American male with coronary artery disease presenting with chest pain  1.  Non-ST MI We will treat patient with IV heparin monitor platelet count due to chronic thrombocytopenia Continue therapy with aspirin And metoprolol Dr. Ubaldo Glassing has been notified via messaging for consult   2.  Essential hypertension continue Norvasc and Toprol-XL  3.  Hypokalemia we will replace potassium  4.  Hyperlipidemia continue Lipitor  5.  Chronic thrombocytopenia we will monitor his platelet count    All the records are reviewed and case discussed with ED provider. Management plans discussed with the patient, family and they are in agreement.  CODE STATUS: Code Status History    Date Active Date Inactive Code Status Order ID Comments User Context   09/24/2017 1310 09/25/2017 1728 Full Code [id number]  Algernon Huxley, MD Inpatient   09/06/2014 1155 09/06/2014 1620 Full Code [id number]  Schnier, Dolores Lory, MD Inpatient    Advance Directive Documentation     Most Recent Value  Type of Advance Directive  Living will  Pre-existing out of facility DNR order (yellow form or pink MOST form)  -  "MOST" Form in Place?  -       TOTAL  TIME TAKING CARE OF THIS PATIENT:45minutes.    Dustin Flock M.D on 01/18/2018 at 5:48 PM  Between 7am to 6pm - Pager - [phone]  After 6pm go to [email or web address] - password [email or web address] [facility]  [phone]  CC: Primary care physician; [name], MD

## 2018-01-18 NOTE — Progress Notes (Signed)
Advanced care plan.  Purpose of the Encounter: CODE STATUS  Parties in Attendance: Patient himself Patient's Decision Capacity: Intact  Subjective/Patient's story: Martin Adkins  is a 73 y.o. male with a known history of coronary artery disease with stent in the past, essential hypertension, BPH, chronic systolic congestive heart failure, CLL and thrombocytopenia, sleep apnea presenting with chest pain since this morning.  He is noted to have non-ST MI  Objective/Medical story  I did I discussed with the patient regarding his desires for cardiac and pulmonary resuscitation  Goals of care determination:   Patient states that he would like everything to be done  CODE STATUS: Full code   Time spent discussing advanced care planning: 16 minutes

## 2018-01-18 NOTE — ED Notes (Signed)
Heparin verified with Johny Shock.

## 2018-01-18 NOTE — ED Notes (Signed)
hospitalist at bedside

## 2018-01-19 ENCOUNTER — Encounter
Admission: EM | Disposition: A | Discharge status: Home / Self Care | Payer: Self-pay | Source: Home / Self Care | Attending: Internal Medicine

## 2018-01-19 ENCOUNTER — Other Ambulatory Visit: Payer: Self-pay

## 2018-01-19 ENCOUNTER — Encounter: Payer: Self-pay | Admitting: Emergency Medicine

## 2018-01-19 HISTORY — PX: LEFT HEART CATH AND CORONARY ANGIOGRAPHY: CATH118249

## 2018-01-19 LAB — LIPID PANEL
Cholesterol: 116 mg/dL (ref 0–200)
HDL: 51 mg/dL (ref >40)
LDL Cholesterol: 49 mg/dL (ref 0–99)
Total CHOL/HDL Ratio: 2.3 RATIO
Triglycerides: 81 mg/dL (ref <150)
VLDL: 16 mg/dL (ref 0–40)

## 2018-01-19 LAB — APTT
APTT: 114 s — AB (ref 24–36)
aPTT: 62 seconds — ABNORMAL HIGH (ref 24–36)

## 2018-01-19 LAB — CBC
HCT: 39 % (ref 39.0–52.0)
Hemoglobin: 12.9 g/dL — ABNORMAL LOW (ref 13.0–17.0)
MCH: 32.1 pg (ref 26.0–34.0)
MCHC: 33.1 g/dL (ref 30.0–36.0)
MCV: 97 fL (ref 80.0–100.0)
PLATELETS: 62 10*3/uL — AB (ref 150–400)
RBC: 4.02 MIL/uL — ABNORMAL LOW (ref 4.22–5.81)
RDW: 14.1 % (ref 11.5–15.5)
WBC: 3.7 10*3/uL — ABNORMAL LOW (ref 4.0–10.5)
nRBC: 0 % (ref 0.0–0.2)

## 2018-01-19 LAB — HEPARIN LEVEL (UNFRACTIONATED)
Heparin Unfractionated: 0.25 IU/mL — ABNORMAL LOW (ref 0.30–0.70)
Heparin Unfractionated: 0.82 IU/mL — ABNORMAL HIGH (ref 0.30–0.70)

## 2018-01-19 LAB — TROPONIN I
Troponin I: 3.95 ng/mL (ref <0.03)
Troponin I: 2.02 ng/mL (ref <0.03)

## 2018-01-19 SURGERY — LEFT HEART CATH AND CORONARY ANGIOGRAPHY
Anesthesia: Moderate Sedation

## 2018-01-19 MED ORDER — ACETAMINOPHEN 325 MG PO TABS: 650.0000 mg | ORAL_TABLET | ORAL | Status: DC | PRN

## 2018-01-19 MED ORDER — SODIUM CHLORIDE 0.9 % IV SOLN: 250.0000 mL | INTRAVENOUS | Status: DC | PRN

## 2018-01-19 MED ORDER — FENTANYL CITRATE (PF) 100 MCG/2ML IJ SOLN
INTRAMUSCULAR | Status: DC | PRN
  Administered 2018-01-19: 25 ug via INTRAVENOUS

## 2018-01-19 MED ORDER — ISOSORBIDE MONONITRATE ER 60 MG PO TB24
60.0000 mg | ORAL_TABLET | Freq: Every day | ORAL | Status: DC
  Administered 2018-01-20: 60 mg via ORAL
  Filled 2018-01-19: qty 1

## 2018-01-19 MED ORDER — SODIUM CHLORIDE 0.9% FLUSH: 3.0000 mL | INTRAVENOUS | Status: DC | PRN

## 2018-01-19 MED ORDER — SODIUM CHLORIDE 0.9% FLUSH
3.0000 mL | Freq: Two times a day (BID) | INTRAVENOUS | Status: DC
  Administered 2018-01-19 – 2018-01-20 (×2): 3 mL via INTRAVENOUS

## 2018-01-19 MED ORDER — IOPAMIDOL (ISOVUE-300) INJECTION 61%
INTRAVENOUS | Status: DC | PRN
  Administered 2018-01-19: 135 mL via INTRA_ARTERIAL

## 2018-01-19 MED ORDER — HEPARIN (PORCINE) IN NACL 1000-0.9 UT/500ML-% IV SOLN
INTRAVENOUS | Status: AC
  Filled 2018-01-19: qty 1000

## 2018-01-19 MED ORDER — FENTANYL CITRATE (PF) 100 MCG/2ML IJ SOLN
INTRAMUSCULAR | Status: AC
  Filled 2018-01-19: qty 2

## 2018-01-19 MED ORDER — MIDAZOLAM HCL 2 MG/2ML IJ SOLN
INTRAMUSCULAR | Status: AC
  Filled 2018-01-19: qty 2

## 2018-01-19 MED ORDER — SODIUM CHLORIDE 0.9 % WEIGHT BASED INFUSION: 1.0000 mL/kg/h | INTRAVENOUS | Status: DC

## 2018-01-19 MED ORDER — ONDANSETRON HCL 4 MG/2ML IJ SOLN: 4.0000 mg | Freq: Four times a day (QID) | INTRAMUSCULAR | Status: DC | PRN

## 2018-01-19 MED ORDER — ASPIRIN 81 MG PO CHEW: 81.0000 mg | CHEWABLE_TABLET | ORAL | Status: DC

## 2018-01-19 MED ORDER — SODIUM CHLORIDE 0.9 % WEIGHT BASED INFUSION
3.0000 mL/kg/h | INTRAVENOUS | Status: DC
  Administered 2018-01-19: 3 mL/kg/h via INTRAVENOUS

## 2018-01-19 MED ORDER — MIDAZOLAM HCL 2 MG/2ML IJ SOLN
INTRAMUSCULAR | Status: DC | PRN
  Administered 2018-01-19: 1 mg via INTRAVENOUS

## 2018-01-19 MED ORDER — HEPARIN BOLUS VIA INFUSION
1500.0000 [IU] | Freq: Once | INTRAVENOUS | Status: AC
  Administered 2018-01-19: 1500 [IU] via INTRAVENOUS
  Filled 2018-01-19: qty 1500

## 2018-01-19 MED ORDER — SODIUM CHLORIDE 0.9 % WEIGHT BASED INFUSION: 1.0000 mL/kg/h | INTRAVENOUS | Status: AC

## 2018-01-19 MED ORDER — SODIUM CHLORIDE 0.9% FLUSH: 3.0000 mL | Freq: Two times a day (BID) | INTRAVENOUS | Status: DC

## 2018-01-19 SURGICAL SUPPLY — 9 items
CATH INFINITI 5 FR 3DRC (CATHETERS) ×2 IMPLANT
CATH INFINITI 5FR ANG PIGTAIL (CATHETERS) ×2 IMPLANT
CATH INFINITI 5FR JL4 (CATHETERS) ×2 IMPLANT
CATH INFINITI JR4 5F (CATHETERS) ×2 IMPLANT
KIT MANI 3VAL PERCEP (MISCELLANEOUS) ×2 IMPLANT
NEEDLE PERC 18GX7CM (NEEDLE) ×2 IMPLANT
PACK CARDIAC CATH (CUSTOM PROCEDURE TRAY) ×2 IMPLANT
SHEATH AVANTI 5FR X 11CM (SHEATH) ×2 IMPLANT
WIRE GUIDERIGHT .035X150 (WIRE) ×2 IMPLANT

## 2018-01-19 NOTE — Progress Notes (Signed)
Notified MD of TIS of 3.95. No new orders at this time

## 2018-01-19 NOTE — Plan of Care (Signed)
  Problem: Cardiac: Goal: Ability to achieve and maintain adequate cardiovascular perfusion will improve Outcome: Progressing   Problem: Health Behavior/Discharge Planning: Goal: Ability to safely manage health-related needs after discharge will improve Outcome: Progressing   

## 2018-01-19 NOTE — Consult Note (Signed)
Cardiology Consultation Note    Patient ID: Martin Adkins, MRN: [id number], DOB/AGE: 71-Dec-1947 71 y.o. Admit date: 01/18/2018   Date of Consult: 01/19/2018 Primary Physician: [name], MD Primary Cardiologist: Dr. Nehemiah Massed  Chief Complaint: chest pain Reason for Consultation: chest pain/nstemi Requesting MD: Dr. Darvin Neighbours  HPI: Martin Adkins is a 73 y.o. male with history of coronary artery disease status post coronary artery bypass grafting in 2004 with a left internal mammary to the LAD, saphenous vein graft to the first diagonal, OM1, RCA.  In 2015 he had an occluded saphenous vein graft to the RCA and D1 and underwent PCI of the OM1 graft.  Patient also had a history of an abdominal aortic aneurysm underwent an EVAR in 2013.  He had a revision of this in 2019 in September secondary to an endoleak.  His aortic diameter was 5.9 x 5.5 cm in October 2019.  Patient has a history of CLL with a platelet count of 86,000 today.  He has tolerated dual antiplatelet therapy in the past without bleeding.  He presented to the emergency room with complaints of typical anginal symptoms with radiation to his neck jaw and left arm with diaphoresis.  He is ruled in for a non-ST elevation myocardial infarction.  EKG revealed sinus rhythm with nonspecific ST-T wave changes.  Chest x-ray revealed no acute cardiopulmonary disease.  Abdominal CT revealed no acute findings in the abdomen pelvis.  Chest CT revealed no acute pulmonary embolus.  Past Medical History:  Diagnosis Date  . AAA (abdominal aortic aneurysm) without rupture ([place]) 04/05/2014  . AAA (abdominal aortic aneurysm) without rupture ([place]) 04/05/2014  . Abdominal aortic aneurysm without rupture ([place])   . Acute non-ST elevation myocardial infarction (NSTEMI) ([place]) 08/15/2014  . 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/28/2014  . APS (antiphospholipid syndrome) ([place])   . Arteriosclerosis of coronary artery 04/05/2014   Overview:  Sp cabg with lima  to lad svg to d1 om1 rca 2004 with occluded svg to rca and d1 and pci stetn of om1 graft 2015   . Arthritis   . B12 deficiency 03/21/2017  . Barrett's esophagus   . Benign essential HTN 05/02/2014  . BPH (benign prostatic hyperplasia)   . CAD (coronary artery disease)   . CHF (congestive heart failure) ([place])   . Chronic systolic heart failure ([place]) 04/19/2014   Overview:  With segmental LV systolic dysfunction ejection fraction of 35%   . CLL (chronic lymphocytic leukemia) ([place])   . CLL (chronic lymphocytic leukemia) ([place]) 05/24/2017  . DVT (deep venous thrombosis) ([place])   . Dysrhythmia    ATRIAL FIB.  Marland Kitchen GERD (gastroesophageal reflux disease)   . History of hiatal hernia   . History of shingles 2013  . Hyperlipemia   . Hyperplastic polyps of stomach   . Hypertension   . Myocardial infarction ([place]) 07/30/2014  . NSTEMI (non-ST elevated myocardial infarction) ([place])   . OSA (obstructive sleep apnea)   . Osteoarthritis   . Pre-diabetes   . PVD (peripheral vascular disease) ([place])   . RA (rheumatoid arthritis) ([place])   . SOB (shortness of breath)       Surgical History:  Past Surgical History:  Procedure Laterality Date  . ABDOMINAL AORTA STENT    . CHOLECYSTECTOMY    . COLONOSCOPY  11/24/2009  . CORONARY ANGIOPLASTY WITH STENT PLACEMENT  2015  . CORONARY ARTERY BYPASS GRAFT    . ENDOVASCULAR REPAIR/STENT GRAFT N/A 09/24/2017   Procedure: ENDOVASCULAR REPAIR/STENT GRAFT;  Surgeon: Algernon Huxley, MD;  Location: [facility];  Service: Cardiovascular;  Laterality: N/A;  . ESOPHAGOGASTRODUODENOSCOPY  05/26/02  03/23/12  . ESOPHAGOGASTRODUODENOSCOPY (EGD) WITH PROPOFOL N/A 10/28/2016   Procedure: ESOPHAGOGASTRODUODENOSCOPY (EGD) WITH PROPOFOL;  Surgeon: Manya Silvas, MD;  Location: [facility] ENDOSCOPY;  Service: Endoscopy;  Laterality: N/A;  . PERIPHERAL VASCULAR CATHETERIZATION N/A 09/06/2014   Procedure: IVC Filter Removal;  Surgeon: Katha Cabal, MD;  Location: [facility];  Service: Cardiovascular;  Laterality: N/A;  . TRANSURETHRAL RESECTION OF PROSTATE N/A 02/20/2015   Procedure: TRANSURETHRAL RESECTION OF THE PROSTATE (TURP);  Surgeon: Hollice Espy, MD;  Location: [facility] ORS;  Service: Urology;  Laterality: N/A;     Home Meds: Prior to Admission medications   Medication Sig Start Date End Date Taking? Authorizing Provider  albuterol (PROVENTIL HFA;VENTOLIN HFA) 108 (90 Base) MCG/ACT inhaler Inhale 2 puffs into the lungs every 6 (six) hours as needed for wheezing or shortness of breath. 02/26/15  Yes Nance Pear, MD  allopurinol (ZYLOPRIM) 300 MG tablet Take 1 tablet (300 mg total) by mouth daily. 09/17/17  Yes Earlie Server, MD  amLODipine (NORVASC) 10 MG tablet Take 10 mg by mouth daily.  11/19/14  Yes [provider]  atorvastatin (LIPITOR) 80 MG tablet Take 80 mg by mouth daily.    Yes [provider]  fluticasone (FLONASE) 50 MCG/ACT nasal spray Place 2 sprays into both nostrils daily.  05/24/14  Yes [provider]  gabapentin (NEURONTIN) 300 MG capsule Take 1 capsule (300 mg total) by mouth 3 (three) times daily. Patient taking differently: Take 300 mg by mouth 2 (two) times daily.  06/03/17  Yes Earlie Server, MD  hydroxychloroquine (PLAQUENIL) 200 MG tablet Take 200 mg by mouth every other day.    Yes [provider]  Ipratropium-Albuterol (COMBIVENT) 20-100 MCG/ACT AERS respimat Inhale 1 puff into the lungs every 6 (six) hours. 05/26/17  Yes Verlon Au, NP  isosorbide mononitrate (IMDUR) 30 MG 24 hr tablet Take 30 mg by mouth daily.  07/25/14  Yes [provider]  LUBRICANT EYE DROPS 0.4-0.3 % SOLN Apply 1 drop to eye daily as needed (dry eyes).    Yes [provider]  metoprolol succinate (TOPROL-XL) 100 MG 24 hr tablet Take 100 mg by mouth daily.  05/17/14  Yes [provider]  pantoprazole (PROTONIX) 40 MG tablet Take 40 mg by mouth daily.  07/07/14  Yes [provider]   potassium chloride SA (K-DUR,KLOR-CON) 20 MEQ tablet Take 20 mEq by mouth daily.  07/25/14  Yes [provider]  vitamin B-12 (CYANOCOBALAMIN) 100 MCG tablet Take 1 tablet (100 mcg total) by mouth daily. 10/24/17  Yes Earlie Server, MD  montelukast (SINGULAIR) 10 MG tablet Take 1 tablet (10 mg total) by mouth as needed. Take before Gazyva infusion. Patient not taking: Reported on 01/18/2018 05/27/17   Earlie Server, MD  rivaroxaban (XARELTO) 20 MG TABS tablet Take 1 tablet (20 mg total) by mouth daily with supper. Patient not taking: Reported on 01/18/2018 12/24/17   Earlie Server, MD  venetoclax 100 MG TABS Take 400 mg by mouth daily. Take with food and a full glass of water. Patient not taking: Reported on 01/18/2018 09/30/17   Earlie Server, MD    Inpatient Medications:  . allopurinol  300 mg Oral Daily  . amLODipine  10 mg Oral Daily  . aspirin  324 mg Oral NOW   Or  . aspirin  300 mg Rectal  NOW  . aspirin EC  81 mg Oral Daily  . atorvastatin  80 mg Oral Daily  . fluticasone  2 spray Each Nare Daily  . gabapentin  300 mg Oral BID  . hydroxychloroquine  200 mg Oral QODAY  . ipratropium-albuterol  3 mL Nebulization Q6H  . isosorbide mononitrate  30 mg Oral Daily  . metoprolol succinate  100 mg Oral Daily  . pantoprazole  40 mg Oral Daily  . potassium chloride SA  20 mEq Oral Daily  . vitamin B-12  100 mcg Oral Daily   . sodium chloride 75 mL/hr at 01/18/18 2123  . heparin 1,400 Units/hr (01/19/18 0130)    Allergies:  Allergies  Allergen Reactions  . Ace Inhibitors Other (See Comments)    Other reaction(s): Cough  . Pravastatin Other (See Comments)    Other reaction(s): Muscle Pain  . Rosuvastatin Other (See Comments)    Other reaction(s): Other (See Comments) GI bleed  . Simvastatin Other (See Comments)    Other reaction(s): Muscle Pain  . Amoxicillin Rash  . Niacin Rash  . Penicillins Rash    Has patient had a PCN reaction causing immediate rash, facial/tongue/throat swelling, SOB  or lightheadedness with hypotension: Yes Has patient had a PCN reaction causing severe rash involving mucus membranes or skin necrosis: Unknown Has patient had a PCN reaction that required hospitalization: Unknown Has patient had a PCN reaction occurring within the last 10 years: No If all of the above answers are "NO", then may proceed with Cephalosporin use.    Social History   Socioeconomic History  . Marital status: Legally Separated    Spouse name: Not on file  . Number of children: Not on file  . Years of education: Not on file  . Highest education level: Not on file  Occupational History  . Not on file  Social Needs  . Financial resource strain: Not hard at all  . Food insecurity:    Worry: Never true    Inability: Never true  . Transportation needs:    Medical: No    Non-medical: No  Tobacco Use  . Smoking status: Current Some Day Smoker    Packs/day: 0.50    Years: 20.00    Pack years: 10.00    Types: Cigarettes  . Smokeless tobacco: Never Used  . Tobacco comment: per pt nicotine patch number7mg  -smokes 1-3 cig/per d now  Substance and Sexual Activity  . Alcohol use: Yes    Alcohol/week: 0.0 standard drinks    Comment: social on weekends  . Drug use: No  . Sexual activity: Never  Lifestyle  . Physical activity:    Days per week: 7 days    Minutes per session: 40 min  . Stress: Not at all  Relationships  . Social connections:    Talks on phone: More than three times a week    Gets together: More than three times a week    Attends religious service: More than 4 times per year    Active member of club or organization: No    Attends meetings of clubs or organizations: Never    Relationship status: Separated  . Intimate partner violence:    Fear of current or ex partner: No    Emotionally abused: No    Physically abused: No    Forced sexual activity: No  Other Topics Concern  . Not on file  Social History Narrative  . Not on file     Family History  Problem Relation Age of Onset  . Cancer Mother 17       breast ca  . Diabetes Mother   . Coronary artery disease Father   . Heart attack Father   . Prostate cancer Brother   . Bladder Cancer Neg Hx   . Kidney cancer Neg Hx      Review of Systems: A 12-system review of systems was performed and is negative except as noted in the HPI.  Labs: Recent Labs    01/18/18 1308 01/18/18 1619 01/18/18 2031 01/19/18 0217  TROPONINI 0.05* 0.63* 3.07* 3.95*   Lab Results  Component Value Date   WBC 6.2 01/18/2018   HGB 14.7 01/18/2018   HCT 43.5 01/18/2018   MCV 96.5 01/18/2018   PLT 83 (L) 01/18/2018    Recent Labs  Lab 01/15/18 0828 01/18/18 1308  NA 140 137  K 3.2* 3.5  CL 103 103  CO2 26 24  BUN 14 20  CREATININE 0.86 0.93  CALCIUM 9.1 9.1  PROT 7.6  --   BILITOT 1.5*  --   ALKPHOS 111  --   ALT 35  --   AST 39  --   GLUCOSE 105* 153*   Lab Results  Component Value Date   CHOL 196 05/03/2013   HDL 42 05/03/2013   LDLCALC 121 (H) 05/03/2013   TRIG 164 05/03/2013   No results found for: DDIMER  Radiology/Studies:  Dg Chest 2 View  Result Date: 01/18/2018 CLINICAL DATA:  Shortness of breath. EXAM: CHEST - 2 VIEW COMPARISON:  08/13/2016 FINDINGS: The lungs are clear without focal pneumonia, edema, pneumothorax or pleural effusion. The cardiopericardial silhouette is within normal limits for size. Status post CABG. The visualized bony structures of the thorax are intact. IMPRESSION: No active cardiopulmonary disease. Electronically Signed   By: Misty Stanley M.D.   On: 01/18/2018 14:30   Ct Angio Chest Pe W And/or Wo Contrast  Result Date: 01/18/2018 CLINICAL DATA:  Shortness of breath EXAM: CT ANGIOGRAPHY CHEST WITH CONTRAST TECHNIQUE: Multidetector CT imaging of the chest was performed using the standard protocol during bolus administration of intravenous contrast. Multiplanar CT image reconstructions and MIPs were obtained to evaluate the vascular anatomy.  CONTRAST:  171mL OMNIPAQUE IOHEXOL 350 MG/ML SOLN COMPARISON:  11 13 2018 FINDINGS: Cardiovascular: The heart size is normal. No substantial pericardial effusion. Coronary artery calcification is evident. Patient is status post CABG. There is a soft tissue structure in the prevascular space that appears to arise from a coronary bypass graft. This has increased in size in the interval measuring 3.5 x 3.2 cm today compared to 3.2 x 2.9 cm when I remeasure in a similar fashion on the prior study. 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 Patency can not be assessed due to early bolus timing for pulmonary arteries on today's exam. Atherosclerotic calcification is noted in the wall of the thoracic aorta. No filling defect within the opacified pulmonary arteries to suggest the presence of an acute pulmonary embolus. Mediastinum/Nodes: No mediastinal lymphadenopathy. There is no hilar lymphadenopathy. The esophagus has normal imaging features. There is no axillary lymphadenopathy. Lungs/Pleura: The central tracheobronchial airways are patent. No focal airspace consolidation. No pulmonary edema or pleural effusion. Upper Abdomen: The liver shows diffusely decreased attenuation suggesting steatosis. Otherwise unremarkable. Musculoskeletal: No worrisome lytic or sclerotic osseous abnormality. Review of the MIP images confirms the above findings. IMPRESSION: 1. No CT evidence for acute pulmonary embolus. 2. Interval increase in size of the soft  tissue structure in the prevascular space that appears to arise from a left coronary bypass graft. This was present previously, showing minimal progression in the more than 1 year interval since prior study. 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 Patency can not be assessed due to early bolus timing for pulmonary arteries on today's study. 3.  Aortic Atherosclerois (ICD10-170.0) 4. Hepatic steatosis. Electronically Signed   By: Misty Stanley M.D.   On: 01/18/2018 18:06   Ct Abdomen Pelvis W Contrast  Result Date: 01/18/2018 CLINICAL DATA:  Epigastric pain bilateral flank paresthesia. EXAM: CT ABDOMEN AND PELVIS WITH CONTRAST TECHNIQUE: Multidetector CT imaging of the abdomen and pelvis was performed using the standard protocol following bolus administration of intravenous contrast. CONTRAST:  1105mL OMNIPAQUE IOHEXOL 350 MG/ML SOLN COMPARISON:  09/01/2017 FINDINGS: Lower chest: Unremarkable. Hepatobiliary: No focal abnormality within the liver parenchyma. Gallbladder surgically absent. No intrahepatic or extrahepatic biliary dilation. Pancreas: No focal mass lesion. No dilatation of the main duct. No intraparenchymal cyst. No peripancreatic edema. Spleen: No splenomegaly. No focal mass lesion. Adrenals/Urinary Tract: Stable 2.8 cm right adrenal nodule, also unchanged comparing back to 07/09/2011. Left adrenal gland unremarkable. Cortical scarring noted in both kidneys with a 2.8 cm cyst in the lower pole the left kidney. No evidence for hydroureter. The urinary bladder appears normal for the degree of distention. Stomach/Bowel: Stomach is nondistended. No gastric wall thickening. No evidence of outlet obstruction. Duodenum is normally positioned as is the ligament of Treitz. No small bowel wall thickening. No small bowel dilatation. The terminal ileum is normal. The appendix is normal. No gross colonic mass. No colonic wall thickening. Diverticular changes are noted in the left colon without evidence of diverticulitis. Vascular/Lymphatic: Status post aortic endograft treatment. There is no gastrohepatic or hepatoduodenal ligament lymphadenopathy. No intraperitoneal or retroperitoneal lymphadenopathy. No pelvic sidewall lymphadenopathy. Reproductive: The prostate gland and seminal vesicles are unremarkable. Other: No intraperitoneal free fluid. Musculoskeletal: Small bilateral groin hernias, right greater than left, contain only fat. Mesh in  the region the left groin suggest prior herniorrhaphy with recurrent hernia on today's exam. No worrisome lytic or sclerotic osseous abnormality. IMPRESSION: 1. No acute findings in the abdomen or pelvis. Specifically, no findings to explain the patient's history of abdominal pain. 2. Left colonic diverticulosis without diverticulitis. 3. Bilateral renal cortical scarring with left renal cyst. 4. 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 Electronically Signed   By: Misty Stanley M.D.   On: 01/18/2018 17:52    Wt Readings from Last 3 Encounters:  01/18/18 95.8 kg  12/24/17 97.1 kg  12/09/17 95.3 kg    EKG: Sinus rhythm with nonspecific ST-T wave changes  Physical Exam:  Blood pressure 127/79, pulse 62, temperature 98.2 F (36.8 C), temperature source Oral, resp. rate 16, height 6' (1.829 m), weight 95.8 kg, SpO2 97 %. Body mass index is 28.66 kg/m. General: Well developed, well nourished, in no acute distress. Head: Normocephalic, atraumatic, sclera non-icteric, no xanthomas, nares are without discharge.  Neck: Negative for carotid bruits. JVD not elevated. Lungs: Clear bilaterally to auscultation without wheezes, rales, or rhonchi. Breathing is unlabored. Heart: RRR with S1 S2. No murmurs, rubs, or gallops appreciated. Abdomen: Soft, non-tender, non-distended with normoactive bowel sounds. No hepatomegaly. No rebound/guarding. No obvious abdominal masses. Msk:  Strength and tone appear normal for age. Extremities: No clubbing or cyanosis. No edema.  Distal pedal pulses are 2+ and equal bilaterally. Neuro: Alert and oriented X 3. No facial asymmetry. No focal  deficit. Moves all extremities spontaneously. Psych:  Responds to questions appropriately with a normal affect.     Assessment and Plan  Patient is a 73 year old male with history coronary artery disease status post coronary artery bypass grafting in 2004 with a left internal mammary to the LAD, saphenous vein  graft to the first diagonal, OM1, RCA.  In 2015 he had an occluded saphenous vein graft to the RCA and D1 and underwent PCI of the OM1 graft.  Patient also had a history of an abdominal aortic aneurysm underwent an EVAR in 2013.  He had a revision of this in 2019 in September secondary to an endoleak.  His aortic diameter was 5.9 x 5.5 cm in October 2019.  He also has history of hypertension.  He was admitted with chest pain and ruled in for non-ST elevation myocardial infarction.  Platelet count was 83,000 on presentation.  It was 67,004 days ago.  He had normal renal function.  He was not anemic.  He is ruled in for non-ST elevation myocardial infarction with a peak troponin 0.63.  Pain is typical for his angina.  Will need to proceed with left heart cath to evaluate coronary anatomy.  Patient was placed on heparin.  Will use 81 mg of enteric-coated aspirin and make further recommendations after cardiac cath is complete.  Risk and benefits of procedure were explained to the patient he agrees to proceed. Signed, Teodoro Spray MD 01/19/2018, 7:19 AM Pager: [phone]

## 2018-01-19 NOTE — Progress Notes (Addendum)
ANTICOAGULATION CONSULT NOTE - Initial Consult  Pharmacy Consult for heparin Indication: chest pain/ACS  Allergies  Allergen Reactions  . Ace Inhibitors Other (See Comments)    Other reaction(s): Cough  . Pravastatin Other (See Comments)    Other reaction(s): Muscle Pain  . Rosuvastatin Other (See Comments)    Other reaction(s): Other (See Comments) GI bleed  . Simvastatin Other (See Comments)    Other reaction(s): Muscle Pain  . Amoxicillin Rash  . Niacin Rash  . Penicillins Rash    Has patient had a PCN reaction causing immediate rash, facial/tongue/throat swelling, SOB or lightheadedness with hypotension: Yes Has patient had a PCN reaction causing severe rash involving mucus membranes or skin necrosis: Unknown Has patient had a PCN reaction that required hospitalization: Unknown Has patient had a PCN reaction occurring within the last 10 years: No If all of the above answers are "NO", then may proceed with Cephalosporin use.    Patient Measurements: Height: 6' (182.9 cm) Weight: 211 lb 4.8 oz (95.8 kg) IBW/kg (Calculated) : 77.6 Heparin Dosing Weight: 96.6 kg  Vital Signs: Temp: 98.5 F (36.9 C) (01/13 0802) Temp Source: Oral (01/13 0802) BP: 133/89 (01/13 0802) Pulse Rate: 67 (01/13 0802)  Labs: Recent Labs    01/18/18 1308  01/18/18 1737 01/18/18 2031 01/19/18 0001 01/19/18 0217 01/19/18 0723  HGB 14.7  --   --   --   --   --  12.9*  HCT 43.5  --   --   --   --   --  39.0  PLT 83*  --   --   --   --   --  62*  APTT  --   --  29  --  62*  --  114*  LABPROT  --   --  13.0  --   --   --   --   INR  --   --  0.99  --   --   --   --   HEPARINUNFRC  --   --   --   --  0.25*  --  0.82*  CREATININE 0.93  --   --   --   --   --   --   TROPONINI 0.05*   < >  --  3.07*  --  3.95* 2.02*   < > = values in this interval not displayed.    Estimated Creatinine Clearance: 88.8 mL/min (by C-G formula based on SCr of 0.93 mg/dL).   Medical History: Past Medical  History:  Diagnosis Date  . AAA (abdominal aortic aneurysm) without rupture ([place]) 04/05/2014  . AAA (abdominal aortic aneurysm) without rupture ([place]) 04/05/2014  . Abdominal aortic aneurysm without rupture ([place])   . Acute non-ST elevation myocardial infarction (NSTEMI) ([place]) 08/15/2014  . 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/28/2014  . APS (antiphospholipid syndrome) ([place])   . Arteriosclerosis of coronary artery 04/05/2014   Overview:  Sp cabg with lima to lad svg to d1 om1 rca 2004 with occluded svg to rca and d1 and pci stetn of om1 graft 2015   . Arthritis   . B12 deficiency 03/21/2017  . Barrett's esophagus   . Benign essential HTN 05/02/2014  . BPH (benign prostatic hyperplasia)   . CAD (coronary artery disease)   . CHF (congestive heart failure) ([place])   . Chronic systolic heart failure ([place]) 04/19/2014   Overview:  With segmental LV systolic dysfunction ejection fraction of 35%   . CLL (chronic lymphocytic leukemia) ([place])   .  CLL (chronic lymphocytic leukemia) ([place]) 05/24/2017  . DVT (deep venous thrombosis) ([place])   . Dysrhythmia    ATRIAL FIB.  Marland Kitchen GERD (gastroesophageal reflux disease)   . History of hiatal hernia   . History of shingles 2013  . Hyperlipemia   . Hyperplastic polyps of stomach   . Hypertension   . Myocardial infarction ([place]) 07/30/2014  . NSTEMI (non-ST elevated myocardial infarction) ([place])   . OSA (obstructive sleep apnea)   . Osteoarthritis   . Pre-diabetes   . PVD (peripheral vascular disease) ([place])   . RA (rheumatoid arthritis) ([place])   . SOB (shortness of breath)     Assessment: 73 year old male with chest pain. Patient previously on Xarelto PTA but reports not taking for approximately 2 weeks. Per note from EDP, patient taken off of all anticoagulants and antiplatelets recently due to thrombocytopenia. Planning PCI.  Goal of Therapy: Heparin level 0.3-0.7 units/ml Monitor platelets by anticoagulation protocol: Yes   Plan:  Plt 83 on admission. CBC daily  while on heparin.  1/13 0723 aPTT 114, heparin level 0.82. Platelets down to 62000. No S/S of bleeding per nurse and cardiology aware. Will monitor. Will decrease rate to 1200 units/hr. Recheck HL in 6 hours (aPTT seems to be corresponding to HL).   Paticia Stack, PharmD Pharmacy Resident  01/19/2018 9:13 AM

## 2018-01-19 NOTE — Progress Notes (Signed)
Dr. Ubaldo Glassing aware of low platelets - orders to still give 81mg  asa before procedure / no s/s of bleeding/ will continue to monitor.

## 2018-01-19 NOTE — Progress Notes (Signed)
ANTICOAGULATION CONSULT NOTE - Initial Consult  Pharmacy Consult for heparin Indication: chest pain/ACS  Allergies  Allergen Reactions  . Ace Inhibitors Other (See Comments)    Other reaction(s): Cough  . Pravastatin Other (See Comments)    Other reaction(s): Muscle Pain  . Rosuvastatin Other (See Comments)    Other reaction(s): Other (See Comments) GI bleed  . Simvastatin Other (See Comments)    Other reaction(s): Muscle Pain  . Amoxicillin Rash  . Niacin Rash  . Penicillins Rash    Has patient had a PCN reaction causing immediate rash, facial/tongue/throat swelling, SOB or lightheadedness with hypotension: Yes Has patient had a PCN reaction causing severe rash involving mucus membranes or skin necrosis: Unknown Has patient had a PCN reaction that required hospitalization: Unknown Has patient had a PCN reaction occurring within the last 10 years: No If all of the above answers are "NO", then may proceed with Cephalosporin use.    Patient Measurements: Height: 6' (182.9 cm) Weight: 211 lb 4.8 oz (95.8 kg) IBW/kg (Calculated) : 77.6 Heparin Dosing Weight: 96.6 kg  Vital Signs: Temp: 98.6 F (37 C) (01/12 2019) Temp Source: Oral (01/12 2019) BP: 153/89 (01/12 2019) Pulse Rate: 68 (01/12 2019)  Labs: Recent Labs    01/18/18 1308 01/18/18 1619 01/18/18 1737 01/18/18 2031 01/19/18 0001  HGB 14.7  --   --   --   --   HCT 43.5  --   --   --   --   PLT 83*  --   --   --   --   APTT  --   --  29  --  62*  LABPROT  --   --  13.0  --   --   INR  --   --  0.99  --   --   HEPARINUNFRC  --   --   --   --  0.25*  CREATININE 0.93  --   --   --   --   TROPONINI 0.05* 0.63*  --  3.07*  --     Estimated Creatinine Clearance: 88.8 mL/min (by C-G formula based on SCr of 0.93 mg/dL).   Medical History: Past Medical History:  Diagnosis Date  . AAA (abdominal aortic aneurysm) without rupture ([place]) 04/05/2014  . AAA (abdominal aortic aneurysm) without rupture ([place]) 04/05/2014   . Abdominal aortic aneurysm without rupture ([place])   . Acute non-ST elevation myocardial infarction (NSTEMI) ([place]) 08/15/2014  . 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/28/2014  . APS (antiphospholipid syndrome) ([place])   . Arteriosclerosis of coronary artery 04/05/2014   Overview:  Sp cabg with lima to lad svg to d1 om1 rca 2004 with occluded svg to rca and d1 and pci stetn of om1 graft 2015   . Arthritis   . B12 deficiency 03/21/2017  . Barrett's esophagus   . Benign essential HTN 05/02/2014  . BPH (benign prostatic hyperplasia)   . CAD (coronary artery disease)   . CHF (congestive heart failure) ([place])   . Chronic systolic heart failure ([place]) 04/19/2014   Overview:  With segmental LV systolic dysfunction ejection fraction of 35%   . CLL (chronic lymphocytic leukemia) ([place])   . CLL (chronic lymphocytic leukemia) ([place]) 05/24/2017  . DVT (deep venous thrombosis) ([place])   . Dysrhythmia    ATRIAL FIB.  Marland Kitchen GERD (gastroesophageal reflux disease)   . History of hiatal hernia   . History of shingles 2013  . Hyperlipemia   . Hyperplastic polyps of stomach   .  Hypertension   . Myocardial infarction ([place]) 07/30/2014  . NSTEMI (non-ST elevated myocardial infarction) ([place])   . OSA (obstructive sleep apnea)   . Osteoarthritis   . Pre-diabetes   . PVD (peripheral vascular disease) ([place])   . RA (rheumatoid arthritis) ([place])   . SOB (shortness of breath)     Assessment: 73 year old male with chest pain. Patient previously on Xarelto PTA but reports not taking for approximately 2 weeks. Per note from EDP, patient taken off of all anticoagulants and antiplatelets recently due to thrombocytopenia.  Goal of Therapy:  Heparin level 0.3-0.7 units/ml Monitor platelets by anticoagulation protocol: Yes   Plan:  Plt 83 on admission. Confirmed with Dr. Joni Fears that we will do a half bolus due to low platelets. Will give heparin 2000 unit bolus followed by heparin drip at 1200 units/hr. Heparin level ordered for  1/13 at 0000. Will continue to monitor CBC daily while on heparin.  1/13 0000 aPTT 62, heparin level 0.25. 1500 unit bolus and increase rate to 1400 units/hr. Recheck in 6 hours.  Sim Boast, PharmD, BCPS  01/19/18 1:17 AM

## 2018-01-19 NOTE — Progress Notes (Signed)
[facility] [place] NAME: Elin Seats    MR#:  [id number]  DATE OF BIRTH:  Aug 09, 1945  SUBJECTIVE:  CHIEF COMPLAINT:   Chief Complaint  Patient presents with  . Chest Pain   No further chest pain today.  Waiting for cardiac catheterization  REVIEW OF SYSTEMS:    Review of Systems  Constitutional: Positive for malaise/fatigue. Negative for chills and fever.  HENT: Negative for sore throat.   Eyes: Negative for blurred vision, double vision and pain.  Respiratory: Negative for cough, hemoptysis, shortness of breath and wheezing.   Cardiovascular: Negative for chest pain, palpitations, orthopnea and leg swelling.  Gastrointestinal: Negative for abdominal pain, constipation, diarrhea, heartburn, nausea and vomiting.  Genitourinary: Negative for dysuria and hematuria.  Musculoskeletal: Negative for back pain and joint pain.  Skin: Negative for rash.  Neurological: Negative for sensory change, speech change, focal weakness and headaches.  Endo/Heme/Allergies: Does not bruise/bleed easily.  Psychiatric/Behavioral: Negative for depression. The patient is not nervous/anxious.     DRUG ALLERGIES:   Allergies  Allergen Reactions  . Ace Inhibitors Other (See Comments)    Other reaction(s): Cough  . Pravastatin Other (See Comments)    Other reaction(s): Muscle Pain  . Rosuvastatin Other (See Comments)    Other reaction(s): Other (See Comments) GI bleed  . Simvastatin Other (See Comments)    Other reaction(s): Muscle Pain  . Amoxicillin Rash  . Niacin Rash  . Penicillins Rash    Has patient had a PCN reaction causing immediate rash, facial/tongue/throat swelling, SOB or lightheadedness with hypotension: Yes Has patient had a PCN reaction causing severe rash involving mucus membranes or skin necrosis: Unknown Has patient had a PCN reaction that required hospitalization: Unknown Has patient had a PCN reaction occurring within the last  10 years: No If all of the above answers are "NO", then may proceed with Cephalosporin use.    VITALS:  Blood pressure (!) 123/105, pulse (!) 59, temperature 98.6 F (37 C), temperature source Oral, resp. rate 14, height 6' (1.829 m), weight 95.8 kg, SpO2 96 %.  PHYSICAL EXAMINATION:   Physical Exam  GENERAL:  73 y.o.-year-old patient lying in the bed with no acute distress.  EYES: Pupils equal, round, reactive to light and accommodation. No scleral icterus. Extraocular muscles intact.  HEENT: Head atraumatic, normocephalic. Oropharynx and nasopharynx clear.  NECK:  Supple, no jugular venous distention. No thyroid enlargement, no tenderness.  LUNGS: Normal breath sounds bilaterally, no wheezing, rales, rhonchi. No use of accessory muscles of respiration.  CARDIOVASCULAR: S1, S2 normal. No murmurs, rubs, or gallops.  ABDOMEN: Soft, nontender, nondistended. Bowel sounds present. No organomegaly or mass.  EXTREMITIES: No cyanosis, clubbing or edema b/l.    NEUROLOGIC: Cranial nerves II through XII are intact. No focal Motor or sensory deficits b/l.   PSYCHIATRIC: The patient is alert and oriented x 3.  SKIN: No obvious rash, lesion, or ulcer.   LABORATORY PANEL:   CBC Recent Labs  Lab 01/19/18 0723  WBC 3.7*  HGB 12.9*  HCT 39.0  PLT 62*   ------------------------------------------------------------------------------------------------------------------ Chemistries  Recent Labs  Lab 01/15/18 0828 01/18/18 1308  NA 140 137  K 3.2* 3.5  CL 103 103  CO2 26 24  GLUCOSE 105* 153*  BUN 14 20  CREATININE 0.86 0.93  CALCIUM 9.1 9.1  AST 39  --   ALT 35  --   ALKPHOS 111  --   BILITOT 1.5*  --    ------------------------------------------------------------------------------------------------------------------  Cardiac Enzymes Recent Labs  Lab 01/19/18 0723  TROPONINI 2.02*    ------------------------------------------------------------------------------------------------------------------  RADIOLOGY:  Dg Chest 2 View  Result Date: 01/18/2018 CLINICAL DATA:  Shortness of breath. EXAM: CHEST - 2 VIEW COMPARISON:  08/13/2016 FINDINGS: The lungs are clear without focal pneumonia, edema, pneumothorax or pleural effusion. The cardiopericardial silhouette is within normal limits for size. Status post CABG. The visualized bony structures of the thorax are intact. IMPRESSION: No active cardiopulmonary disease. Electronically Signed   By: Misty Stanley M.D.   On: 01/18/2018 14:30   Ct Angio Chest Pe W And/or Wo Contrast  Result Date: 01/18/2018 CLINICAL DATA:  Shortness of breath EXAM: CT ANGIOGRAPHY CHEST WITH CONTRAST TECHNIQUE: Multidetector CT imaging of the chest was performed using the standard protocol during bolus administration of intravenous contrast. Multiplanar CT image reconstructions and MIPs were obtained to evaluate the vascular anatomy. CONTRAST:  138mL OMNIPAQUE IOHEXOL 350 MG/ML SOLN COMPARISON:  11 13 2018 FINDINGS: Cardiovascular: The heart size is normal. No substantial pericardial effusion. Coronary artery calcification is evident. Patient is status post CABG. There is a soft tissue structure in the prevascular space that appears to arise from a coronary bypass graft. This has increased in size in the interval measuring 3.5 x 3.2 cm today compared to 3.2 x 2.9 cm when I remeasure in a similar fashion on the prior study. 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 Patency can not be assessed due to early bolus timing for pulmonary arteries on today's exam. Atherosclerotic calcification is noted in the wall of the thoracic aorta. No filling defect within the opacified pulmonary arteries to suggest the presence of an acute pulmonary embolus. Mediastinum/Nodes: No mediastinal lymphadenopathy. There is no hilar lymphadenopathy. The esophagus has  normal imaging features. There is no axillary lymphadenopathy. Lungs/Pleura: The central tracheobronchial airways are patent. No focal airspace consolidation. No pulmonary edema or pleural effusion. Upper Abdomen: The liver shows diffusely decreased attenuation suggesting steatosis. Otherwise unremarkable. Musculoskeletal: No worrisome lytic or sclerotic osseous abnormality. Review of the MIP images confirms the above findings. IMPRESSION: 1. No CT evidence for acute pulmonary embolus. 2. Interval increase in size of the soft tissue structure in the prevascular space that appears to arise from a left coronary bypass graft. This was present previously, showing minimal progression in the more than 1 year interval since prior study. 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 Patency can not be assessed due to early bolus timing for pulmonary arteries on today's study. 3.  Aortic Atherosclerois (ICD10-170.0) 4. Hepatic steatosis. Electronically Signed   By: Misty Stanley M.D.   On: 01/18/2018 18:06   Ct Abdomen Pelvis W Contrast  Result Date: 01/18/2018 CLINICAL DATA:  Epigastric pain bilateral flank paresthesia. EXAM: CT ABDOMEN AND PELVIS WITH CONTRAST TECHNIQUE: Multidetector CT imaging of the abdomen and pelvis was performed using the standard protocol following bolus administration of intravenous contrast. CONTRAST:  130mL OMNIPAQUE IOHEXOL 350 MG/ML SOLN COMPARISON:  09/01/2017 FINDINGS: Lower chest: Unremarkable. Hepatobiliary: No focal abnormality within the liver parenchyma. Gallbladder surgically absent. No intrahepatic or extrahepatic biliary dilation. Pancreas: No focal mass lesion. No dilatation of the main duct. No intraparenchymal cyst. No peripancreatic edema. Spleen: No splenomegaly. No focal mass lesion. Adrenals/Urinary Tract: Stable 2.8 cm right adrenal nodule, also unchanged comparing back to 07/09/2011. Left adrenal gland unremarkable. Cortical scarring noted in both  kidneys with a 2.8 cm cyst in the lower pole the left kidney. No evidence for hydroureter. The urinary bladder appears  normal for the degree of distention. Stomach/Bowel: Stomach is nondistended. No gastric wall thickening. No evidence of outlet obstruction. Duodenum is normally positioned as is the ligament of Treitz. No small bowel wall thickening. No small bowel dilatation. The terminal ileum is normal. The appendix is normal. No gross colonic mass. No colonic wall thickening. Diverticular changes are noted in the left colon without evidence of diverticulitis. Vascular/Lymphatic: Status post aortic endograft treatment. There is no gastrohepatic or hepatoduodenal ligament lymphadenopathy. No intraperitoneal or retroperitoneal lymphadenopathy. No pelvic sidewall lymphadenopathy. Reproductive: The prostate gland and seminal vesicles are unremarkable. Other: No intraperitoneal free fluid. Musculoskeletal: Small bilateral groin hernias, right greater than left, contain only fat. Mesh in the region the left groin suggest prior herniorrhaphy with recurrent hernia on today's exam. No worrisome lytic or sclerotic osseous abnormality. IMPRESSION: 1. No acute findings in the abdomen or pelvis. Specifically, no findings to explain the patient's history of abdominal pain. 2. Left colonic diverticulosis without diverticulitis. 3. Bilateral renal cortical scarring with left renal cyst. 4. 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 Electronically Signed   By: Misty Stanley M.D.   On: 01/18/2018 17:52     ASSESSMENT AND PLAN:   Patient is a 73 year old African-American male with coronary artery disease presenting with chest pain  1.  Non-ST MI Aspirin, IV heparin.  Echocardiogram.  Cardiology consulted.  N.p.o.  Cardiac catheterization later today.  2.  Essential hypertension Norvasc and Toprol-XL  3.  Hypokalemia Replaced  4.  Hyperlipidemia continue Lipitor  5.  Chronic  thrombocytopenia .  Stable.  No bleeding.  All the records are reviewed and case discussed with Care Management/Social Workerr. Management plans discussed with the patient, family and they are in agreement.  CODE STATUS: Full code  DVT Prophylaxis: SCDs  TOTAL TIME TAKING CARE OF THIS PATIENT: 35  minutes.   POSSIBLE D/C IN 1-2 DAYS, DEPENDING ON CLINICAL CONDITION.  Neita Carp M.D on 01/19/2018 at 2:08 PM  Between 7am to 6pm - Pager - [phone]  After 6pm go to [email or web address] - password [email or web address] [facility]  Office  [phone]  CC: Primary care physician; [name], MD  Note: This dictation was prepared with Dragon dictation along with smaller phrase technology. Any transcriptional errors that result from this process are unintentional.

## 2018-01-20 ENCOUNTER — Telehealth: Payer: Self-pay | Admitting: *Deleted

## 2018-01-20 LAB — CBC
HCT: 38.5 % — ABNORMAL LOW (ref 39.0–52.0)
Hemoglobin: 12.5 g/dL — ABNORMAL LOW (ref 13.0–17.0)
MCH: 32 pg (ref 26.0–34.0)
MCHC: 32.5 g/dL (ref 30.0–36.0)
MCV: 98.5 fL (ref 80.0–100.0)
Platelets: 60 10*3/uL — ABNORMAL LOW (ref 150–400)
RBC: 3.91 MIL/uL — AB (ref 4.22–5.81)
RDW: 14 % (ref 11.5–15.5)
WBC: 3.8 10*3/uL — ABNORMAL LOW (ref 4.0–10.5)
nRBC: 0 % (ref 0.0–0.2)

## 2018-01-20 MED ORDER — ASPIRIN EC 81 MG PO TBEC: 81.0000 mg | DELAYED_RELEASE_TABLET | Freq: Every day | ORAL | Status: DC

## 2018-01-20 MED ORDER — ISOSORBIDE MONONITRATE ER 60 MG PO TB24: 60.0000 mg | ORAL_TABLET | Freq: Every day | ORAL | 0 refills | Status: AC

## 2018-01-20 NOTE — Plan of Care (Signed)
  Problem: Education: Goal: Understanding of cardiac disease, CV risk reduction, and recovery process will improve Outcome: Adequate for Discharge Goal: Individualized Educational Video(s) Outcome: Adequate for Discharge   Problem: Activity: Goal: Ability to tolerate increased activity will improve Outcome: Adequate for Discharge   Problem: Cardiac: Goal: Ability to achieve and maintain adequate cardiovascular perfusion will improve Outcome: Adequate for Discharge   Problem: Health Behavior/Discharge Planning: Goal: Ability to safely manage health-related needs after discharge will improve Outcome: Adequate for Discharge   Problem: Education: Goal: Understanding of CV disease, CV risk reduction, and recovery process will improve Outcome: Adequate for Discharge Goal: Individualized Educational Video(s) Outcome: Adequate for Discharge   Problem: Activity: Goal: Ability to return to baseline activity level will improve Outcome: Adequate for Discharge   Problem: Cardiovascular: Goal: Ability to achieve and maintain adequate cardiovascular perfusion will improve Outcome: Adequate for Discharge Goal: Vascular access site(s) Level 0-1 will be maintained Outcome: Adequate for Discharge   Problem: Health Behavior/Discharge Planning: Goal: Ability to safely manage health-related needs after discharge will improve Outcome: Adequate for Discharge   

## 2018-01-20 NOTE — Plan of Care (Signed)
  Problem: Cardiac: Goal: Ability to achieve and maintain adequate cardiovascular perfusion will improve Outcome: Progressing   Problem: Activity: Goal: Ability to return to baseline activity level will improve Outcome: Progressing   Problem: Cardiovascular: Goal: Vascular access site(s) Level 0-1 will be maintained Outcome: Progressing

## 2018-01-20 NOTE — Discharge Instructions (Signed)
Resume diet and activity as before ° ° °

## 2018-01-20 NOTE — Telephone Encounter (Signed)
Per VO Dr Tasia Catchings, reschedule appointment to next week.

## 2018-01-20 NOTE — Progress Notes (Signed)
Patient Name: Martin Adkins Date of Encounter: 01/20/2018  Hospital Problem List     Active Problems:   Non-ST elevation MI (NSTEMI) [facility])    Patient Profile     Patient with history of CLL, coronary disease status post coronary artery bypass grafting with a left internal mammary to the LAD, saphenous vein graft to OM, saphenous vein graft to RCA, saphenous vein graft to the diagonal.  Admitted with non-ST relation myocardial infarction.  Cardiac cath revealed patent LIMA to the LAD, no significant disease in the RCA with a 90% stenosis in a small RV marginal branch.  Occluded left circumflex, occluded LAD.  Saphenous vein grafts are all occluded.  RCA does not require grafting.  LAD is well grafted by the left internal mammary.  Medical management  Subjective   Doing well post cath.  Inpatient Medications    . allopurinol  300 mg Oral Daily  . amLODipine  10 mg Oral Daily  . atorvastatin  80 mg Oral Daily  . fluticasone  2 spray Each Nare Daily  . gabapentin  300 mg Oral BID  . hydroxychloroquine  200 mg Oral QODAY  . ipratropium-albuterol  3 mL Nebulization Q6H  . isosorbide mononitrate  60 mg Oral Daily  . metoprolol succinate  100 mg Oral Daily  . pantoprazole  40 mg Oral Daily  . potassium chloride SA  20 mEq Oral Daily  . sodium chloride flush  3 mL Intravenous Q12H  . vitamin B-12  100 mcg Oral Daily    Vital Signs    Vitals:   01/19/18 1656 01/19/18 1951 01/20/18 0437 01/20/18 0734  BP: 116/74 125/84 125/80 124/77  Pulse: 60 62 64 63  Resp:  19 18   Temp: 98.2 F (36.8 C) 98.2 F (36.8 C) 98.2 F (36.8 C) 98.6 F (37 C)  TempSrc: Oral Oral  Oral  SpO2: 99% 98% 97% 98%  Weight:      Height:        Intake/Output Summary (Last 24 hours) at 01/20/2018 0833 Last data filed at 01/20/2018 0622 Gross per 24 hour  Intake -  Output 775 ml  Net -775 ml   Filed Weights   01/18/18 1306 01/18/18 2019 01/19/18 1131  Weight: 96.6 kg 95.8 kg 95.8 kg     Physical Exam    GEN: Well nourished, well developed, in no acute distress.  HEENT: normal.  Neck: Supple, no JVD, carotid bruits, or masses. Cardiac: RRR, no murmurs, rubs, or gallops. No clubbing, cyanosis, edema.  Radials/DP/PT 2+ and equal bilaterally.  Respiratory:  Respirations regular and unlabored, clear to auscultation bilaterally. GI: Soft, nontender, nondistended, BS + x 4. MS: no deformity or atrophy. Skin: warm and dry, no rash. Neuro:  Strength and sensation are intact. Psych: Normal affect.  Labs    CBC Recent Labs    01/19/18 0723 01/20/18 0330  WBC 3.7* 3.8*  HGB 12.9* 12.5*  HCT 39.0 38.5*  MCV 97.0 98.5  PLT 62* 60*   Basic Metabolic Panel Recent Labs    01/18/18 1308  NA 137  K 3.5  CL 103  CO2 24  GLUCOSE 153*  BUN 20  CREATININE 0.93  CALCIUM 9.1   Liver Function Tests No results for input(s): AST, ALT, ALKPHOS, BILITOT, PROT, ALBUMIN in the last 72 hours. No results for input(s): LIPASE, AMYLASE in the last 72 hours. Cardiac Enzymes Recent Labs    01/18/18 2031 01/19/18 0217 01/19/18 0723  TROPONINI 3.07* 3.95* 2.02*  BNP No results for input(s): BNP in the last 72 hours. D-Dimer No results for input(s): DDIMER in the last 72 hours. Hemoglobin A1C No results for input(s): HGBA1C in the last 72 hours. Fasting Lipid Panel Recent Labs    01/19/18 0723  CHOL 116  HDL 51  LDLCALC 49  TRIG 81  CHOLHDL 2.3   Thyroid Function Tests No results for input(s): TSH, T4TOTAL, T3FREE, THYROIDAB in the last 72 hours.  Invalid input(s): FREET3  Telemetry    Sinus rhythm none sinus rhythm with no ischemia  ECG    Sinus rhythm with no ischemia  Radiology    Dg Chest 2 View  Result Date: 01/18/2018 CLINICAL DATA:  Shortness of breath. EXAM: CHEST - 2 VIEW COMPARISON:  08/13/2016 FINDINGS: The lungs are clear without focal pneumonia, edema, pneumothorax or pleural effusion. The cardiopericardial silhouette is within normal  limits for size. Status post CABG. The visualized bony structures of the thorax are intact. IMPRESSION: No active cardiopulmonary disease. Electronically Signed   By: Misty Stanley M.D.   On: 01/18/2018 14:30   Ct Angio Chest Pe W And/or Wo Contrast  Result Date: 01/18/2018 CLINICAL DATA:  Shortness of breath EXAM: CT ANGIOGRAPHY CHEST WITH CONTRAST TECHNIQUE: Multidetector CT imaging of the chest was performed using the standard protocol during bolus administration of intravenous contrast. Multiplanar CT image reconstructions and MIPs were obtained to evaluate the vascular anatomy. CONTRAST:  177mL OMNIPAQUE IOHEXOL 350 MG/ML SOLN COMPARISON:  11 13 2018 FINDINGS: Cardiovascular: The heart size is normal. No substantial pericardial effusion. Coronary artery calcification is evident. Patient is status post CABG. There is a soft tissue structure in the prevascular space that appears to arise from a coronary bypass graft. This has increased in size in the interval measuring 3.5 x 3.2 cm today compared to 3.2 x 2.9 cm when I remeasure in a similar fashion on the prior study. 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 Patency can not be assessed due to early bolus timing for pulmonary arteries on today's exam. Atherosclerotic calcification is noted in the wall of the thoracic aorta. No filling defect within the opacified pulmonary arteries to suggest the presence of an acute pulmonary embolus. Mediastinum/Nodes: No mediastinal lymphadenopathy. There is no hilar lymphadenopathy. The esophagus has normal imaging features. There is no axillary lymphadenopathy. Lungs/Pleura: The central tracheobronchial airways are patent. No focal airspace consolidation. No pulmonary edema or pleural effusion. Upper Abdomen: The liver shows diffusely decreased attenuation suggesting steatosis. Otherwise unremarkable. Musculoskeletal: No worrisome lytic or sclerotic osseous abnormality. Review of the MIP images  confirms the above findings. IMPRESSION: 1. No CT evidence for acute pulmonary embolus. 2. Interval increase in size of the soft tissue structure in the prevascular space that appears to arise from a left coronary bypass graft. This was present previously, showing minimal progression in the more than 1 year interval since prior study. 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 Patency can not be assessed due to early bolus timing for pulmonary arteries on today's study. 3.  Aortic Atherosclerois (ICD10-170.0) 4. Hepatic steatosis. Electronically Signed   By: Misty Stanley M.D.   On: 01/18/2018 18:06   Ct Abdomen Pelvis W Contrast  Result Date: 01/18/2018 CLINICAL DATA:  Epigastric pain bilateral flank paresthesia. EXAM: CT ABDOMEN AND PELVIS WITH CONTRAST TECHNIQUE: Multidetector CT imaging of the abdomen and pelvis was performed using the standard protocol following bolus administration of intravenous contrast. CONTRAST:  143mL OMNIPAQUE IOHEXOL 350 MG/ML SOLN COMPARISON:  09/01/2017 FINDINGS: Lower chest: Unremarkable. Hepatobiliary: No focal abnormality within the liver parenchyma. Gallbladder surgically absent. No intrahepatic or extrahepatic biliary dilation. Pancreas: No focal mass lesion. No dilatation of the main duct. No intraparenchymal cyst. No peripancreatic edema. Spleen: No splenomegaly. No focal mass lesion. Adrenals/Urinary Tract: Stable 2.8 cm right adrenal nodule, also unchanged comparing back to 07/09/2011. Left adrenal gland unremarkable. Cortical scarring noted in both kidneys with a 2.8 cm cyst in the lower pole the left kidney. No evidence for hydroureter. The urinary bladder appears normal for the degree of distention. Stomach/Bowel: Stomach is nondistended. No gastric wall thickening. No evidence of outlet obstruction. Duodenum is normally positioned as is the ligament of Treitz. No small bowel wall thickening. No small bowel dilatation. The terminal ileum is  normal. The appendix is normal. No gross colonic mass. No colonic wall thickening. Diverticular changes are noted in the left colon without evidence of diverticulitis. Vascular/Lymphatic: Status post aortic endograft treatment. There is no gastrohepatic or hepatoduodenal ligament lymphadenopathy. No intraperitoneal or retroperitoneal lymphadenopathy. No pelvic sidewall lymphadenopathy. Reproductive: The prostate gland and seminal vesicles are unremarkable. Other: No intraperitoneal free fluid. Musculoskeletal: Small bilateral groin hernias, right greater than left, contain only fat. Mesh in the region the left groin suggest prior herniorrhaphy with recurrent hernia on today's exam. No worrisome lytic or sclerotic osseous abnormality. IMPRESSION: 1. No acute findings in the abdomen or pelvis. Specifically, no findings to explain the patient's history of abdominal pain. 2. Left colonic diverticulosis without diverticulitis. 3. Bilateral renal cortical scarring with left renal cyst. 4. 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 Electronically Signed   By: Misty Stanley M.D.   On: 01/18/2018 17:52    Assessment & Plan    Patient with three-vessel coronary disease status post coronary bypass grafting with PCI of the vein graft to OM who was admitted with non-ST ovation myocardial infarction.  As mentioned above, cardiac cath revealed patent LIMA to an occluded LAD with insignificant disease in the RCA.  He does have an RV marginal branch that has stenosis however to small vessel not a candidate for PCI or revascularization from this.  Occluded vein graft to the OM and diagonal.  Medical management is indicated.  His vein graft to the OM stent is occluded.  Patient has extremely tortuous vessel.  This is not amenable to PCI.  Continue with medical management to include increasing his long-acting nitrates, consideration for resuming aspirin however patient has CLL and current platelet count is  60,000.  Will use guidance from oncology regarding restarting aspirin.  No complications from cath at cath site in his right femoral region. Signed, [name] Gwynne Kemnitz MD 01/20/2018, 8:33 AM  Pager: [phone]

## 2018-01-20 NOTE — Telephone Encounter (Signed)
Patient called to report that he has an appointment tomorrow with Dr Tasia Catchings , but he is in the hospital and thinks he may be discharged today. Asking what he is to do regarding his appointment with Dr Tasia Catchings tomorrow. Please advise    Assessment & Plan    Patient with three-vessel coronary disease status post coronary bypass grafting with PCI of the vein graft to OM who was admitted with non-ST ovation myocardial infarction.  As mentioned above, cardiac cath revealed patent LIMA to an occluded LAD with insignificant disease in the RCA.  He does have an RV marginal branch that has stenosis however to small vessel not a candidate for PCI or revascularization from this.  Occluded vein graft to the OM and diagonal.  Medical management is indicated.  His vein graft to the OM stent is occluded.  Patient has extremely tortuous vessel.  This is not amenable to PCI.  Continue with medical management to include increasing his long-acting nitrates, consideration for resuming aspirin however patient has CLL and current platelet count is 60,000.  Will use guidance from oncology regarding restarting aspirin.  No complications from cath at cath site in his right femoral region. Signed, [name] [name] MD 01/20/2018, 8:33 AM  Pager: [phone]

## 2018-01-20 NOTE — Telephone Encounter (Signed)
Per Hassan Rowan 01/20/18 scheduling message:Please reschedule appt to next week and call pt with time and date. Lab/MD appt were R/S as requested I called pt and left a message on his vmail making him aware of the date and time of his R/S appt.

## 2018-01-20 NOTE — Progress Notes (Signed)
Patient given discharge instructions with printed prescription for Imdur. Patient verbalized understanding with no further questions or concerns. Post Cath dressing instructions went over as well. IV's taken out and tele monitor off. Patient going home via family vehicle in stable condition.

## 2018-01-20 NOTE — Progress Notes (Addendum)
Cardiovascular and Pulmonary Nurse Navigator Note:     HISTORY OF PRESENT ILLNESS: Martin Adkins  is a 73 y.o. male with a known history of coronary artery disease - CABG in 2004 -  with stent in the past, essential hypertension, BPH, chronic systolic congestive heart failure, CLL and thrombocytopenia, sleep apnea who presented to the ED with chest pain.  Patient states that the pain is on the left side of his chest dull ache.  Worst with activity but also occurs at rest.  He also has shortness of breath.  Also pain radiating to the left arm.  Denied any pain radiation to his jaw.  No nausea vomiting.   [name]  CARDIAC CATHETERIZATION  Order# [id number]  Reading physician: Teodoro Spray, MD Ordering physician: Teodoro Spray, MD Study date: 01/19/18  Physicians   Panel Physicians Referring Physician Case Authorizing Physician  [name], [name], MD (Primary)  Teodoro Spray, MD  Procedures   LEFT HEART CATH AND CORONARY ANGIOGRAPHY  Conclusion     Ost LAD to Prox LAD lesion is 90% stenosed.  Prox LAD lesion is 100% stenosed.  Ost 1st Diag to 1st Diag lesion is 90% stenosed.  Origin lesion is 100% stenosed.  Origin to Prox Graft lesion is 100% stenosed.  Mid Graft to Dist Graft lesion is 100% stenosed.  Prox Graft lesion is 99% stenosed.  The left ventricular ejection fraction is 35-45% by visual estimate.  There is no aortic valve stenosis.  There is no mitral valve stenosis and no mitral valve prolapse evident.   90% first diagonal.  RCA had an acute marginal with a 99% stenosis.  RCA had no significant disease.  Vein graft to RCA is chronically occluded.  Vein graft to diagonal is chronically occluded.  Vein graft to OM has a stent with proximal occlusion.  LIMA to the LAD is widely patent.  Revealed films suggest medical management.  Not amenable to PCI or further coronary bypass grafting.  EF 35 to 45% globally.  Cardiac cath completed with no immediate  complications.  Patient has occluded proximal circumflex, occluded proximal to mid LAD.   EDUCATION:   Per above, CAD is not a new diagnosis for this patient.   Discussed modifiable risk factors including controlling blood pressure, cholesterol, and blood sugar; following heart healthy diet; maintaining healthy weight; exercise; and smoking cessation.  ? Discussed cardiac medications including rationale for taking, mechanisms of action, and side effects. Stressed the importance of taking medications as prescribed. Assigned RN to review with patient his medications, prescriptions and when the next dose is due for each medication.  Discharge cardiac medications include:  ASA, Lipitor, Imdur, Metoprolol.  Patient has CLL and thrombocytopenia.  Patient being discharged on Aspirin 81 mg.   ? Discussed emergency plan for heart attack symptoms. Patient verbalized understanding of need to call 911 and not to drive himself to ER if having cardiac symptoms / chest pain.  ? Heart healthy diet of low sodium, low fat, low cholesterol heart healthy diet discussed.  ? Smoking Cessation - Patient is a current every day smoker. Patient reports he smokes one to three cigarettes per day. Patient encouraged to quit.  ? Exercise - Benefits of exercised discussed. Patient informed this RN he has not been exercising.   Patient has participated in [facility] (Cardiac Rehab) in the past and was a part of the [facility] program until he started treatments for CLL and could no longer go.  Informed  patient that his cardiologist has referred him to outpatient Cardiac Rehab. Patient stated that he does not need to call his insurance company, as his insurance paid for Cardiac Rehab the last time he participated.  Patient stated, "I will try it again.  Go ahead and schedule for next Thursday."  Patient's orientation appointment in Cardiac Rehab is scheduled for January 29, 2018 at 11:30 a.m.  Orientation packet given to patient.   Patient instructed to complete questionnaires and bring to red folder to orientation appointment.  Patient verbalized understanding.    Patient to follow-up with cardiologist in one week.   ? Patient appreciative of the above information.  ? Roanna Epley, RN, BSN, [place]  [facility]  Cardiovascular & Pulmonary Nurse Navigator  Direct Line: [phone]  Department Phone #: [phone] Fax: [phone]  Email Address: [email or web address]

## 2018-01-21 ENCOUNTER — Inpatient Hospital Stay: Payer: Medicare PPO | Admitting: Oncology

## 2018-01-21 ENCOUNTER — Inpatient Hospital Stay: Payer: Medicare PPO

## 2018-01-29 ENCOUNTER — Encounter: Payer: Self-pay | Admitting: *Deleted

## 2018-01-29 ENCOUNTER — Encounter: Payer: Medicare PPO | Attending: Cardiology | Admitting: *Deleted

## 2018-01-29 VITALS — Ht 71.5 in | Wt 212.0 lb

## 2018-01-29 DIAGNOSIS — I5022 Chronic systolic (congestive) heart failure: Secondary | ICD-10-CM | POA: Insufficient documentation

## 2018-01-29 DIAGNOSIS — Z87891 Personal history of nicotine dependence: Secondary | ICD-10-CM | POA: Insufficient documentation

## 2018-01-29 DIAGNOSIS — G4733 Obstructive sleep apnea (adult) (pediatric): Secondary | ICD-10-CM | POA: Diagnosis not present

## 2018-01-29 DIAGNOSIS — I739 Peripheral vascular disease, unspecified: Secondary | ICD-10-CM | POA: Insufficient documentation

## 2018-01-29 DIAGNOSIS — Z7982 Long term (current) use of aspirin: Secondary | ICD-10-CM | POA: Diagnosis not present

## 2018-01-29 DIAGNOSIS — I214 Non-ST elevation (NSTEMI) myocardial infarction: Secondary | ICD-10-CM | POA: Diagnosis present

## 2018-01-29 DIAGNOSIS — I110 Hypertensive heart disease with heart failure: Secondary | ICD-10-CM | POA: Insufficient documentation

## 2018-01-29 DIAGNOSIS — Z79899 Other long term (current) drug therapy: Secondary | ICD-10-CM | POA: Diagnosis not present

## 2018-01-29 DIAGNOSIS — R7303 Prediabetes: Secondary | ICD-10-CM | POA: Insufficient documentation

## 2018-01-29 DIAGNOSIS — I2510 Atherosclerotic heart disease of native coronary artery without angina pectoris: Secondary | ICD-10-CM | POA: Insufficient documentation

## 2018-01-29 DIAGNOSIS — E785 Hyperlipidemia, unspecified: Secondary | ICD-10-CM | POA: Insufficient documentation

## 2018-01-29 DIAGNOSIS — I4891 Unspecified atrial fibrillation: Secondary | ICD-10-CM | POA: Insufficient documentation

## 2018-01-29 DIAGNOSIS — K219 Gastro-esophageal reflux disease without esophagitis: Secondary | ICD-10-CM | POA: Insufficient documentation

## 2018-01-29 DIAGNOSIS — M069 Rheumatoid arthritis, unspecified: Secondary | ICD-10-CM | POA: Diagnosis not present

## 2018-01-29 NOTE — Patient Instructions (Signed)
Patient Instructions  Patient Details  Name: Martin Adkins MRN: [id number] Date of Birth: 06-07-1945 Referring Provider:  Teodoro Spray, MD  Below are your personal goals for exercise, nutrition, and risk factors. Our goal is to help you stay on track towards obtaining and maintaining these goals. We will be discussing your progress on these goals with you throughout the program.  Initial Exercise Prescription: Initial Exercise Prescription - 01/29/18 1400      Date of Initial Exercise RX and Referring Provider   Date  01/29/18    Referring Provider  [name]      Treadmill   MPH  2    Grade  1    Minutes  15    METs  2.8      Recumbant Bike   Level  2    RPM  60    Watts  24    Minutes  15      NuStep   Level  3    SPM  80    Minutes  15    METs  2.8      Prescription Details   Frequency (times per week)  3    Duration  Progress to 30 minutes of continuous aerobic without signs/symptoms of physical distress      Intensity   THRR 40-80% of Max Heartrate  107-136    Ratings of Perceived Exertion  11-15    Perceived Dyspnea  0-4      Resistance Training   Training Prescription  Yes    Weight  3 lb    Reps  10-15       Exercise Goals: Frequency: Be able to perform aerobic exercise two to three times per week in program working toward 2-5 days per week of home exercise.  Intensity: Work with a perceived exertion of 11 (fairly light) - 15 (hard) while following your exercise prescription.  We will make changes to your prescription with you as you progress through the program.   Duration: Be able to do 30 to 45 minutes of continuous aerobic exercise in addition to a 5 minute warm-up and a 5 minute cool-down routine.   Nutrition Goals: Your personal nutrition goals will be established when you do your nutrition analysis with the dietician.  The following are general nutrition guidelines to follow: Cholesterol < 200mg /day Sodium < 1500mg /day Fiber: Men over 50  yrs - 30 grams per day  Personal Goals: Personal Goals and Risk Factors at Admission - 01/29/18 1447      Core Components/Risk Factors/Patient Goals on Admission   Tobacco Cessation  Yes    Number of packs per day  Quit 2 weeks ago Jan 2020    Intervention  Assist the participant in steps to quit. Provide individualized education and counseling about committing to Tobacco Cessation, relapse prevention, and pharmacological support that can be provided by physician.;Advice worker, assist with locating and accessing local/national Quit Smoking programs, and support quit date choice.    Expected Outcomes  Short Term: Will demonstrate readiness to quit, by selecting a quit date.;Long Term: Complete abstinence from all tobacco products for at least 12 months from quit date.;Short Term: Will quit all tobacco product use, adhering to prevention of relapse plan.    Heart Failure  Yes    Intervention  Provide a combined exercise and nutrition program that is supplemented with education, support and counseling about heart failure. Directed toward relieving symptoms such as shortness of breath, decreased exercise tolerance, and  extremity edema.    Expected Outcomes  Improve functional capacity of life;Short term: Attendance in program 2-3 days a week with increased exercise capacity. Reported lower sodium intake. Reported increased fruit and vegetable intake. Reports medication compliance.;Short term: Daily weights obtained and reported for increase. Utilizing diuretic protocols set by physician.;Long term: Adoption of self-care skills and reduction of barriers for early signs and symptoms recognition and intervention leading to self-care maintenance.   Spoke to [employer] about daily weights and reporting 2-3 pound increase to MD. [name] has stopped his LAsix "I had to go all the time" Advised Jope to let his MD know he has stopped the LAsix and to check on the potassuim he is prescribed.;    Hypertension  Yes    Intervention  Provide education on lifestyle modifcations including regular physical activity/exercise, weight management, moderate sodium restriction and increased consumption of fresh fruit, vegetables, and low fat dairy, alcohol moderation, and smoking cessation.;Monitor prescription use compliance.    Expected Outcomes  Short Term: Continued assessment and intervention until BP is < 140/67mm HG in hypertensive participants. < 130/73mm HG in hypertensive participants with diabetes, heart failure or chronic kidney disease.;Long Term: Maintenance of blood pressure at goal levels.    Lipids  Yes    Intervention  Provide education and support for participant on nutrition & aerobic/resistive exercise along with prescribed medications to achieve LDL 70mg , HDL >40mg .    Expected Outcomes  Short Term: Participant states understanding of desired cholesterol values and is compliant with medications prescribed. Participant is following exercise prescription and nutrition guidelines.;Long Term: Cholesterol controlled with medications as prescribed, with individualized exercise RX and with personalized nutrition plan. Value goals: LDL < 70mg , HDL > 40 mg.       Tobacco Use Initial Evaluation: Social History   Tobacco Use  Smoking Status Former Smoker  . Packs/day: 0.50  . Years: 20.00  . Pack years: 10.00  . Types: Cigarettes  Smokeless Tobacco Never Used  Tobacco Comment   Quit     Exercise Goals and Review: Exercise Goals    Row Name 01/29/18 1437             Exercise Goals   Increase Physical Activity  Yes       Intervention  Provide advice, education, support and counseling about physical activity/exercise needs.;Develop an individualized exercise prescription for aerobic and resistive training based on initial evaluation findings, risk stratification, comorbidities and participant's personal goals.       Expected Outcomes  Short Term: Attend rehab on a regular basis  to increase amount of physical activity.;Long Term: Add in home exercise to make exercise part of routine and to increase amount of physical activity.;Long Term: Exercising regularly at least 3-5 days a week.       Increase Strength and Stamina  Yes       Intervention  Provide advice, education, support and counseling about physical activity/exercise needs.;Develop an individualized exercise prescription for aerobic and resistive training based on initial evaluation findings, risk stratification, comorbidities and participant's personal goals.       Expected Outcomes  Short Term: Increase workloads from initial exercise prescription for resistance, speed, and METs.;Short Term: Perform resistance training exercises routinely during rehab and add in resistance training at home;Long Term: Improve cardiorespiratory fitness, muscular endurance and strength as measured by increased METs and functional capacity (6MWT)       Able to understand and use rate of perceived exertion (RPE) scale  Yes  Intervention  Provide education and explanation on how to use RPE scale       Expected Outcomes  Short Term: Able to use RPE daily in rehab to express subjective intensity level;Long Term:  Able to use RPE to guide intensity level when exercising independently       Knowledge and understanding of Target Heart Rate Range (THRR)  Yes       Intervention  Provide education and explanation of THRR including how the numbers were predicted and where they are located for reference       Expected Outcomes  Short Term: Able to state/look up THRR;Short Term: Able to use daily as guideline for intensity in rehab;Long Term: Able to use THRR to govern intensity when exercising independently       Able to check pulse independently  Yes       Intervention  Provide education and demonstration on how to check pulse in carotid and radial arteries.;Review the importance of being able to check your own pulse for safety during independent  exercise       Expected Outcomes  Short Term: Able to explain why pulse checking is important during independent exercise;Long Term: Able to check pulse independently and accurately       Understanding of Exercise Prescription  Yes       Intervention  Provide education, explanation, and written materials on patient's individual exercise prescription       Expected Outcomes  Short Term: Able to explain program exercise prescription;Long Term: Able to explain home exercise prescription to exercise independently          Copy of goals given to participant.

## 2018-01-29 NOTE — Progress Notes (Signed)
Cardiac Individual Treatment Plan  Patient Details  Name: Martin Adkins MRN: [id number] Date of Birth: 23-Jul-1945 Referring Provider:     Cardiac Rehab from 01/29/2018 in [facility] Cardiac and Pulmonary Rehab  Referring Provider  [name]      Initial Encounter Date:    Cardiac Rehab from 01/29/2018 in [facility] Cardiac and Pulmonary Rehab  Date  01/29/18      Visit Diagnosis: NSTEMI (non-ST elevated myocardial infarction) [facility])  Patient's Home Medications on Admission:  Current Outpatient Medications:  .  albuterol (PROVENTIL HFA;VENTOLIN HFA) 108 (90 Base) MCG/ACT inhaler, Inhale 2 puffs into the lungs every 6 (six) hours as needed for wheezing or shortness of breath., Disp: 1 Inhaler, Rfl: 0 .  allopurinol (ZYLOPRIM) 300 MG tablet, Take 1 tablet (300 mg total) by mouth daily., Disp: 30 tablet, Rfl: 0 .  amLODipine (NORVASC) 10 MG tablet, Take 10 mg by mouth daily. , Disp: , Rfl:  .  aspirin EC 81 MG tablet, Take 1 tablet (81 mg total) by mouth daily., Disp: , Rfl:  .  atorvastatin (LIPITOR) 80 MG tablet, Take 80 mg by mouth daily. , Disp: , Rfl: 11 .  fluticasone (FLONASE) 50 MCG/ACT nasal spray, Place 2 sprays into both nostrils daily. , Disp: , Rfl:  .  gabapentin (NEURONTIN) 300 MG capsule, Take 1 capsule (300 mg total) by mouth 3 (three) times daily. (Patient taking differently: Take 300 mg by mouth 2 (two) times daily. ), Disp: 90 capsule, Rfl: 0 .  hydroxychloroquine (PLAQUENIL) 200 MG tablet, Take 200 mg by mouth every other day. , Disp: , Rfl:  .  isosorbide mononitrate (IMDUR) 60 MG 24 hr tablet, Take 1 tablet (60 mg total) by mouth daily., Disp: 30 tablet, Rfl: 0 .  metoprolol succinate (TOPROL-XL) 100 MG 24 hr tablet, Take 100 mg by mouth daily. , Disp: , Rfl:  .  pantoprazole (PROTONIX) 40 MG tablet, Take 40 mg by mouth daily. , Disp: , Rfl:  .  potassium chloride SA (K-DUR,KLOR-CON) 20 MEQ tablet, Take 20 mEq by mouth daily. , Disp: , Rfl:  .  vitamin B-12 (CYANOCOBALAMIN) 100  MCG tablet, Take 1 tablet (100 mcg total) by mouth daily., Disp: 90 tablet, Rfl: 1 .  Ipratropium-Albuterol (COMBIVENT) 20-100 MCG/ACT AERS respimat, Inhale 1 puff into the lungs every 6 (six) hours., Disp: 1 Inhaler, Rfl: 11 .  LUBRICANT EYE DROPS 0.4-0.3 % SOLN, Apply 1 drop to eye daily as needed (dry eyes). , Disp: , Rfl:  .  montelukast (SINGULAIR) 10 MG tablet, Take 1 tablet (10 mg total) by mouth as needed. Take before Gazyva infusion. (Patient not taking: Reported on 01/18/2018), Disp: 20 tablet, Rfl: 0  Past Medical History: Past Medical History:  Diagnosis Date  . AAA (abdominal aortic aneurysm) without rupture ([place]) 04/05/2014  . AAA (abdominal aortic aneurysm) without rupture ([place]) 04/05/2014  . Abdominal aortic aneurysm without rupture ([place])   . Acute non-ST elevation myocardial infarction (NSTEMI) ([place]) 08/15/2014  . 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/28/2014  . APS (antiphospholipid syndrome) ([place])   . Arteriosclerosis of coronary artery 04/05/2014   Overview:  Sp cabg with lima to lad svg to d1 om1 rca 2004 with occluded svg to rca and d1 and pci stetn of om1 graft 2015   . Arthritis   . B12 deficiency 03/21/2017  . Barrett's esophagus   . Benign essential HTN 05/02/2014  . BPH (benign prostatic hyperplasia)   . CAD (coronary artery disease)   . CHF (congestive heart failure) (  [place])   . Chronic systolic heart failure ([place]) 04/19/2014   Overview:  With segmental LV systolic dysfunction ejection fraction of 35%   . CLL (chronic lymphocytic leukemia) ([place])   . CLL (chronic lymphocytic leukemia) ([place]) 05/24/2017  . DVT (deep venous thrombosis) ([place])   . Dysrhythmia    ATRIAL FIB.  Marland Kitchen GERD (gastroesophageal reflux disease)   . History of hiatal hernia   . History of shingles 2013  . Hyperlipemia   . Hyperplastic polyps of stomach   . Hypertension   . Myocardial infarction ([place]) 07/30/2014  . NSTEMI (non-ST elevated myocardial infarction) ([place])   . OSA (obstructive sleep apnea)    . Osteoarthritis   . Pre-diabetes   . PVD (peripheral vascular disease) ([place])   . RA (rheumatoid arthritis) ([place])   . SOB (shortness of breath)     Tobacco Use: Social History   Tobacco Use  Smoking Status Former Smoker  . Packs/day: 0.50  . Years: 20.00  . Pack years: 10.00  . Types: Cigarettes  Smokeless Tobacco Never Used  Tobacco Comment   Quit     Labs: Recent Review Flowsheet Data    Labs for ITP Cardiac and Pulmonary Rehab Latest Ref Rng & Units 05/03/2013 01/19/2018   Cholestrol 0 - 200 mg/dL 196 116   LDLCALC 0 - 99 mg/dL 121(H) 49   HDL >40 mg/dL 42 51   Trlycerides <150 mg/dL 164 81       Exercise Target Goals: Exercise Program Goal: Individual exercise prescription set using results from initial 6 min walk test and THRR while considering  patient's activity barriers and safety.   Exercise Prescription Goal: Initial exercise prescription builds to 30-45 minutes a day of aerobic activity, 2-3 days per week.  Home exercise guidelines will be given to patient during program as part of exercise prescription that the participant will acknowledge.  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 See MD 1/24 to see next step in care.  SOB with exertion, fatigued. Was receiving Chiroparactic Care until stopped by Cancer MD for low platelet count   Cardiac Risk Stratification  High       6 Minute Walk: 6 Minute Walk    Row Name 01/29/18 1438         6 Minute Walk   Phase  Initial     Distance  1235 feet     Walk Time  6 minutes     # of Rest Breaks  0     MPH  2.34     METS  2.8     RPE  11     Perceived Dyspnea   0     VO2 Peak  9.78     Symptoms  Yes (comment)     Comments  L hip pain due to arthritis 2/10     Resting HR  79 bpm      Resting BP  126/70     Exercise Oxygen Saturation  during 6 min walk  97 %     Max Ex. HR  109 bpm     Max Ex. BP  120/68     2 Minute Post BP  122/70        Oxygen Initial Assessment:   Oxygen Re-Evaluation:   Oxygen  Discharge (Final Oxygen Re-Evaluation):   Initial Exercise Prescription: Initial Exercise Prescription - 01/29/18 1400      Date of Initial Exercise RX and Referring Provider   Date  01/29/18    Referring Provider  [name]      Treadmill   MPH  2    Grade  1    Minutes  15    METs  2.8      Recumbant Bike   Level  2    RPM  60    Watts  24    Minutes  15      NuStep   Level  3    SPM  80    Minutes  15    METs  2.8      Prescription Details   Frequency (times per week)  3    Duration  Progress to 30 minutes of continuous aerobic without signs/symptoms of physical distress      Intensity   THRR 40-80% of Max Heartrate  107-136    Ratings of Perceived Exertion  11-15    Perceived Dyspnea  0-4      Resistance Training   Training Prescription  Yes    Weight  3 lb    Reps  10-15       Perform Capillary Blood Glucose checks as needed.  Exercise Prescription Changes: Exercise Prescription Changes    Row Name 01/29/18 1400             Response to Exercise   Blood Pressure (Admit)  126/70       Blood Pressure (Exercise)  120/68       Blood Pressure (Exit)  122/70       Heart Rate (Admit)  79 bpm       Heart Rate (Exercise)  109 bpm       Heart Rate (Exit)  77 bpm       Oxygen Saturation (Exit)  97 %       Rating of Perceived Exertion (Exercise)  11          Exercise Comments:   Exercise Goals and Review: Exercise Goals    Row Name 01/29/18 1437             Exercise Goals   Increase Physical Activity  Yes       Intervention  Provide advice, education, support and counseling about physical activity/exercise needs.;Develop an individualized exercise prescription for aerobic and resistive training based on initial  evaluation findings, risk stratification, comorbidities and participant's personal goals.       Expected Outcomes  Short Term: Attend rehab on a regular basis to increase amount of physical activity.;Long Term: Add in home exercise to make exercise part of routine and to increase amount of physical activity.;Long Term: Exercising regularly at least 3-5 days a week.       Increase Strength and Stamina  Yes       Intervention  Provide advice, education, support and counseling about physical activity/exercise needs.;Develop an individualized exercise prescription for aerobic and resistive training based on initial evaluation findings, risk stratification, comorbidities and participant's personal goals.       Expected Outcomes  Short Term: Increase workloads from initial exercise prescription for resistance, speed, and METs.;Short Term: Perform resistance training exercises routinely during rehab and add in resistance training at home;Long Term: Improve cardiorespiratory fitness, muscular endurance and strength as measured by increased METs and functional capacity (6MWT)       Able to understand  and use rate of perceived exertion (RPE) scale  Yes       Intervention  Provide education and explanation on how to use RPE scale       Expected Outcomes  Short Term: Able to use RPE daily in rehab to express subjective intensity level;Long Term:  Able to use RPE to guide intensity level when exercising independently       Knowledge and understanding of Target Heart Rate Range (THRR)  Yes       Intervention  Provide education and explanation of THRR including how the numbers were predicted and where they are located for reference       Expected Outcomes  Short Term: Able to state/look up THRR;Short Term: Able to use daily as guideline for intensity in rehab;Long Term: Able to use THRR to govern intensity when exercising independently       Able to check pulse independently  Yes       Intervention  Provide education  and demonstration on how to check pulse in carotid and radial arteries.;Review the importance of being able to check your own pulse for safety during independent exercise       Expected Outcomes  Short Term: Able to explain why pulse checking is important during independent exercise;Long Term: Able to check pulse independently and accurately       Understanding of Exercise Prescription  Yes       Intervention  Provide education, explanation, and written materials on patient's individual exercise prescription       Expected Outcomes  Short Term: Able to explain program exercise prescription;Long Term: Able to explain home exercise prescription to exercise independently          Exercise Goals Re-Evaluation :   Discharge Exercise Prescription (Final Exercise Prescription Changes): Exercise Prescription Changes - 01/29/18 1400      Response to Exercise   Blood Pressure (Admit)  126/70    Blood Pressure (Exercise)  120/68    Blood Pressure (Exit)  122/70    Heart Rate (Admit)  79 bpm    Heart Rate (Exercise)  109 bpm    Heart Rate (Exit)  77 bpm    Oxygen Saturation (Exit)  97 %    Rating of Perceived Exertion (Exercise)  11       Nutrition:  Target Goals: Understanding of nutrition guidelines, daily intake of sodium <1577m, cholesterol <2031m calories 30% from fat and 7% or less from saturated fats, daily to have 5 or more servings of fruits and vegetables.  Biometrics: Pre Biometrics - 01/29/18 1437      Pre Biometrics   Height  5' 11.5" (1.816 m)    Weight  212 lb (96.2 kg)    Waist Circumference  42 inches    Hip Circumference  40 inches    Waist to Hip Ratio  1.05 %    BMI (Calculated)  29.16    Single Leg Stand  5.48 seconds        Nutrition Therapy Plan and Nutrition Goals: Nutrition Therapy & Goals - 01/29/18 1445      Intervention Plan   Intervention  Prescribe, educate and counsel regarding individualized specific dietary modifications aiming towards targeted  core components such as weight, hypertension, lipid management, diabetes, heart failure and other comorbidities.    Expected Outcomes  Short Term Goal: Understand basic principles of dietary content, such as calories, fat, sodium, cholesterol and nutrients.;Short Term Goal: A plan has been developed with personal nutrition goals set during  dietitian appointment.;Long Term Goal: Adherence to prescribed nutrition plan.       Nutrition Assessments: Nutrition Assessments - 01/29/18 1446      MEDFICTS Scores   Pre Score  53       Nutrition Goals Re-Evaluation:   Nutrition Goals Discharge (Final Nutrition Goals Re-Evaluation):   Psychosocial: Target Goals: Acknowledge presence or absence of significant depression and/or stress, maximize coping skills, provide positive support system. Participant is able to verbalize types and ability to use techniques and skills needed for reducing stress and depression.   Initial Review & Psychosocial Screening: Initial Psych Review & Screening - 01/29/18 1441      Initial Review   Current issues with  Current Stress Concerns    Source of Stress Concerns  Unable to perform yard/household activities;Unable to participate in former interests or hobbies    Comments  SInce Chemo and cardiac issues has not been abel to do daily activies. Fatigue, SOB with exertion have been concerns.       Family Dynamics   Good Support System?  Yes   Daughter, Son and brother     Barriers   Psychosocial barriers to participate in program  There are no identifiable barriers or psychosocial needs.;The patient should benefit from training in stress management and relaxation.      Screening Interventions   Interventions  Encouraged to exercise;To provide support and resources with identified psychosocial needs;Provide feedback about the scores to participant    Expected Outcomes  Short Term goal: Utilizing psychosocial counselor, staff and physician to assist with  identification of specific Stressors or current issues interfering with healing process. Setting desired goal for each stressor or current issue identified.;Long Term Goal: Stressors or current issues are controlled or eliminated.;Long Term goal: The participant improves quality of Life and PHQ9 Scores as seen by post scores and/or verbalization of changes;Short Term goal: Identification and review with participant of any Quality of Life or Depression concerns found by scoring the questionnaire.       Quality of Life Scores:  Quality of Life - 01/29/18 1443      Quality of Life Scores   Health/Function Pre  16.17 %    Socioeconomic Pre  19.64 %    Psych/Spiritual Pre  26 %    Family Pre  4.5 %    GLOBAL Pre  16.92 %      Scores of 19 and below usually indicate a poorer quality of life in these areas.  A difference of  2-3 points is a clinically meaningful difference.  A difference of 2-3 points in the total score of the Quality of Life Index has been associated with significant improvement in overall quality of life, self-image, physical symptoms, and general health in studies assessing change in quality of life.  PHQ-9: Recent Review Flowsheet Data    Depression screen [facility] 2/9 01/29/2018 01/29/2018 11/16/2014 09/26/2014   Decreased Interest - 0 2 1   Down, Depressed, Hopeless 2 0 0 0   PHQ - 2 Score 2 0 2 1   Altered sleeping - 0 0 1   Tired, decreased energy 3 0 2 1   Change in appetite - 0 0 0   Feeling bad or failure about yourself  - 0 0 0   Trouble concentrating - 0 - 0   Moving slowly or fidgety/restless - 0 0 0   Suicidal thoughts - 0 0 0   PHQ-9 Score - 0 4 3   Difficult doing work/chores  Somewhat difficult - Somewhat difficult Somewhat difficult     Interpretation of Total Score  Total Score Depression Severity:  1-4 = Minimal depression, 5-9 = Mild depression, 10-14 = Moderate depression, 15-19 = Moderately severe depression, 20-27 = Severe depression   Psychosocial  Evaluation and Intervention:   Psychosocial Re-Evaluation:   Psychosocial Discharge (Final Psychosocial Re-Evaluation):   Vocational Rehabilitation: Provide vocational rehab assistance to qualifying candidates.   Vocational Rehab Evaluation & Intervention: Vocational Rehab - 01/29/18 1446      Initial Vocational Rehab Evaluation & Intervention   Assessment shows need for Vocational Rehabilitation  No       Education: Education Goals: Education classes will be provided on a variety of topics geared toward better understanding of heart health and risk factor modification. Participant will state understanding/return demonstration of topics presented as noted by education test scores.  Learning Barriers/Preferences: Learning Barriers/Preferences - 01/29/18 1446      Learning Barriers/Preferences   Learning Barriers  None    Learning Preferences  None       Education Topics:  AED/CPR: - Group verbal and written instruction with the use of models to demonstrate the basic use of the AED with the basic ABC's of resuscitation.   General Nutrition Guidelines/Fats and Fiber: -Group instruction provided by verbal, written material, models and posters to present the general guidelines for heart healthy nutrition. Gives an explanation and review of dietary fats and fiber.   Cardiac Rehab from 11/21/2014 in [facility] Cardiac and Pulmonary Rehab  Date  10/17/14  Educator  PI  Instruction Review Code (retired)  2- meets goals/outcomes      Controlling Sodium/Reading Food Labels: -Group verbal and written material supporting the discussion of sodium use in heart healthy nutrition. Review and explanation with models, verbal and written materials for utilization of the food label.   Cardiac Rehab from 11/21/2014 in [facility] Cardiac and Pulmonary Rehab  Date  10/31/14  Educator  CR  Instruction Review Code (retired)  2- meets goals/outcomes      Exercise Physiology & General Exercise  Guidelines: - Group verbal and written instruction with models to review the exercise physiology of the cardiovascular system and associated critical values. Provides general exercise guidelines with specific guidelines to those with heart or lung disease.    Cardiac Rehab from 11/21/2014 in [facility] Cardiac and Pulmonary Rehab  Date  11/09/14  Educator  RM  Instruction Review Code (retired)  2- meets goals/outcomes      Aerobic Exercise & Resistance Training: - Gives group verbal and written instruction on the various components of exercise. Focuses on aerobic and resistive training programs and the benefits of this training and how to safely progress through these programs..   Cardiac Rehab from 11/21/2014 in [facility] Cardiac and Pulmonary Rehab  Date  11/14/14  Educator  RM  Instruction Review Code (retired)  2- Statistician, Balance, Mind/Body Relaxation: Provides group verbal/written instruction on the benefits of flexibility and balance training, including mind/body exercise modes such as yoga, pilates and tai chi.  Demonstration and skill practice provided.   Cardiac Rehab from 11/21/2014 in [facility] Cardiac and Pulmonary Rehab  Date  11/21/14  Educator  RM  Instruction Review Code (retired)  2- meets goals/outcomes      Stress and Anxiety: - Provides group verbal and written instruction about the health risks of elevated stress and causes of high stress.  Discuss the correlation between heart/lung disease and anxiety and treatment options.  Review healthy ways to manage with stress and anxiety.   Depression: - Provides group verbal and written instruction on the correlation between heart/lung disease and depressed mood, treatment options, and the stigmas associated with seeking treatment.   Cardiac Rehab from 11/21/2014 in [facility] Cardiac and Pulmonary Rehab  Date  11/02/14  Educator  [facility]  Instruction Review Code (retired)  2- meets Designer, fashion/clothing  & Physiology of the Heart: - Group verbal and written instruction and models provide basic cardiac anatomy and physiology, with the coronary electrical and arterial systems. Review of Valvular disease and Heart Failure   Cardiac Rehab from 11/21/2014 in [facility] Cardiac and Pulmonary Rehab  Date  10/03/14  Educator  SB  Instruction Review Code (retired)  2- meets goals/outcomes      Cardiac Procedures: - Group verbal and written instruction to review commonly prescribed medications for heart disease. Reviews the medication, class of the drug, and side effects. Includes the steps to properly store meds and maintain the prescription regimen. (beta blockers and nitrates)   Cardiac Rehab from 11/21/2014 in [facility] Cardiac and Pulmonary Rehab  Date  10/24/14  Educator  SB  Instruction Review Code (retired)  2- meets goals/outcomes      Cardiac Medications I: - Group verbal and written instruction to review commonly prescribed medications for heart disease. Reviews the medication, class of the drug, and side effects. Includes the steps to properly store meds and maintain the prescription regimen.   Cardiac Rehab from 11/21/2014 in [facility] Cardiac and Pulmonary Rehab  Date  11/07/14  Educator  SB  Instruction Review Code (retired)  2- meets goals/outcomes      Cardiac Medications II: -Group verbal and written instruction to review commonly prescribed medications for heart disease. Reviews the medication, class of the drug, and side effects. (all other drug classes)    Go Sex-Intimacy & Heart Disease, Get SMART - Goal Setting: - Group verbal and written instruction through game format to discuss heart disease and the return to sexual intimacy. Provides group verbal and written material to discuss and apply goal setting through the application of the S.M.A.R.T. Method.   Cardiac Rehab from 11/21/2014 in [facility] Cardiac and Pulmonary Rehab  Date  10/24/14  Educator  SB  Instruction Review Code  (retired)  2- meets goals/outcomes      Other Matters of the Heart: - Provides group verbal, written materials and models to describe Stable Angina and Peripheral Artery. Includes description of the disease process and treatment options available to the cardiac patient.   Cardiac Rehab from 11/21/2014 in [facility] Cardiac and Pulmonary Rehab  Date  10/12/14  Educator  [place]  Instruction Review Code (retired)  2- meets goals/outcomes      Exercise & Equipment Safety: - Individual verbal instruction and demonstration of equipment use and safety with use of the equipment.   Cardiac Rehab from 01/29/2018 in [facility] Cardiac and Pulmonary Rehab  Date  01/29/18  Educator  sb  Instruction Review Code  1- Verbalizes Understanding      Infection Prevention: - Provides verbal and written material to individual with discussion of infection control including proper hand washing and proper equipment cleaning during exercise session.   Cardiac Rehab from 01/29/2018 in [facility] Cardiac and Pulmonary Rehab  Date  01/29/18  Educator  SB1  Instruction Review Code  1- Verbalizes Understanding      Falls Prevention: - Provides verbal and written material to individual with discussion of falls prevention  and safety.   Cardiac Rehab from 01/29/2018 in [facility] Cardiac and Pulmonary Rehab  Date  01/29/18  Educator  sb  Instruction Review Code  1- Verbalizes Understanding      Diabetes: - Individual verbal and written instruction to review signs/symptoms of diabetes, desired ranges of glucose level fasting, after meals and with exercise. Acknowledge that pre and post exercise glucose checks will be done for 3 sessions at entry of program.   Know Your Numbers and Risk Factors: -Group verbal and written instruction about important numbers in your health.  Discussion of what are risk factors and how they play a role in the disease process.  Review of Cholesterol, Blood Pressure, Diabetes, and BMI and the role they play  in your overall health.   Sleep Hygiene: -Provides group verbal and written instruction about how sleep can affect your health.  Define sleep hygiene, discuss sleep cycles and impact of sleep habits. Review good sleep hygiene tips.    Other: -Provides group and verbal instruction on various topics (see comments)   Knowledge Questionnaire Score: Knowledge Questionnaire Score - 01/29/18 1446      Knowledge Questionnaire Score   Pre Score  21/26   Reviewed correct responses with [name] today. He verbalized understanding and had no further questions today      Core Components/Risk Factors/Patient Goals at Admission: Personal Goals and Risk Factors at Admission - 01/29/18 1447      Core Components/Risk Factors/Patient Goals on Admission   Tobacco Cessation  Yes    Number of packs per day  Quit 2 weeks ago Jan 2020    Intervention  Assist the participant in steps to quit. Provide individualized education and counseling about committing to Tobacco Cessation, relapse prevention, and pharmacological support that can be provided by physician.;Advice worker, assist with locating and accessing local/national Quit Smoking programs, and support quit date choice.    Expected Outcomes  Short Term: Will demonstrate readiness to quit, by selecting a quit date.;Long Term: Complete abstinence from all tobacco products for at least 12 months from quit date.;Short Term: Will quit all tobacco product use, adhering to prevention of relapse plan.    Heart Failure  Yes    Intervention  Provide a combined exercise and nutrition program that is supplemented with education, support and counseling about heart failure. Directed toward relieving symptoms such as shortness of breath, decreased exercise tolerance, and extremity edema.    Expected Outcomes  Improve functional capacity of life;Short term: Attendance in program 2-3 days a week with increased exercise capacity. Reported lower sodium intake.  Reported increased fruit and vegetable intake. Reports medication compliance.;Short term: Daily weights obtained and reported for increase. Utilizing diuretic protocols set by physician.;Long term: Adoption of self-care skills and reduction of barriers for early signs and symptoms recognition and intervention leading to self-care maintenance.   Spoke to [employer] about daily weights and reporting 2-3 pound increase to MD. [name] has stopped his LAsix "I had to go all the time" Advised Jope to let his MD know he has stopped the LAsix and to check on the potassuim he is prescribed.;   Hypertension  Yes    Intervention  Provide education on lifestyle modifcations including regular physical activity/exercise, weight management, moderate sodium restriction and increased consumption of fresh fruit, vegetables, and low fat dairy, alcohol moderation, and smoking cessation.;Monitor prescription use compliance.    Expected Outcomes  Short Term: Continued assessment and intervention until BP is < 140/59m HG in hypertensive participants. <  130/73m HG in hypertensive participants with diabetes, heart failure or chronic kidney disease.;Long Term: Maintenance of blood pressure at goal levels.    Lipids  Yes    Intervention  Provide education and support for participant on nutrition & aerobic/resistive exercise along with prescribed medications to achieve LDL <725m HDL >4054m   Expected Outcomes  Short Term: Participant states understanding of desired cholesterol values and is compliant with medications prescribed. Participant is following exercise prescription and nutrition guidelines.;Long Term: Cholesterol controlled with medications as prescribed, with individualized exercise RX and with personalized nutrition plan. Value goals: LDL < 71m59mDL > 40 mg.       Core Components/Risk Factors/Patient Goals Review:    Core Components/Risk Factors/Patient Goals at Discharge (Final Review):    ITP Comments: ITP  Comments    Row Name 01/29/18 1424           ITP Comments  Medical review completed . ITP sent to Dr M MiLoleta Chance review, changes as needed and signature. Documentation of diagnosis can be found in [facility]2/2020          Comments:

## 2018-01-30 ENCOUNTER — Inpatient Hospital Stay: Payer: Medicare PPO

## 2018-01-30 ENCOUNTER — Other Ambulatory Visit: Payer: Self-pay

## 2018-01-30 ENCOUNTER — Inpatient Hospital Stay (HOSPITAL_BASED_OUTPATIENT_CLINIC_OR_DEPARTMENT_OTHER): Payer: Medicare PPO | Admitting: Oncology

## 2018-01-30 ENCOUNTER — Encounter: Payer: Self-pay | Admitting: Oncology

## 2018-01-30 VITALS — BP 108/68 | HR 76 | Temp 96.8°F | Resp 18 | Wt 212.8 lb

## 2018-01-30 DIAGNOSIS — I252 Old myocardial infarction: Secondary | ICD-10-CM

## 2018-01-30 DIAGNOSIS — Z7982 Long term (current) use of aspirin: Secondary | ICD-10-CM | POA: Diagnosis not present

## 2018-01-30 DIAGNOSIS — I739 Peripheral vascular disease, unspecified: Secondary | ICD-10-CM

## 2018-01-30 DIAGNOSIS — K219 Gastro-esophageal reflux disease without esophagitis: Secondary | ICD-10-CM | POA: Diagnosis not present

## 2018-01-30 DIAGNOSIS — Z87891 Personal history of nicotine dependence: Secondary | ICD-10-CM

## 2018-01-30 DIAGNOSIS — R161 Splenomegaly, not elsewhere classified: Secondary | ICD-10-CM | POA: Diagnosis not present

## 2018-01-30 DIAGNOSIS — D6959 Other secondary thrombocytopenia: Secondary | ICD-10-CM | POA: Diagnosis not present

## 2018-01-30 DIAGNOSIS — Z86718 Personal history of other venous thrombosis and embolism: Secondary | ICD-10-CM | POA: Diagnosis not present

## 2018-01-30 DIAGNOSIS — I110 Hypertensive heart disease with heart failure: Secondary | ICD-10-CM | POA: Diagnosis not present

## 2018-01-30 DIAGNOSIS — D3501 Benign neoplasm of right adrenal gland: Secondary | ICD-10-CM

## 2018-01-30 DIAGNOSIS — D6861 Antiphospholipid syndrome: Secondary | ICD-10-CM

## 2018-01-30 DIAGNOSIS — D696 Thrombocytopenia, unspecified: Secondary | ICD-10-CM

## 2018-01-30 DIAGNOSIS — R0602 Shortness of breath: Secondary | ICD-10-CM

## 2018-01-30 DIAGNOSIS — Z803 Family history of malignant neoplasm of breast: Secondary | ICD-10-CM

## 2018-01-30 DIAGNOSIS — K449 Diaphragmatic hernia without obstruction or gangrene: Secondary | ICD-10-CM | POA: Diagnosis not present

## 2018-01-30 DIAGNOSIS — I482 Chronic atrial fibrillation, unspecified: Secondary | ICD-10-CM | POA: Diagnosis not present

## 2018-01-30 DIAGNOSIS — E785 Hyperlipidemia, unspecified: Secondary | ICD-10-CM | POA: Diagnosis not present

## 2018-01-30 DIAGNOSIS — Z79899 Other long term (current) drug therapy: Secondary | ICD-10-CM

## 2018-01-30 DIAGNOSIS — G4733 Obstructive sleep apnea (adult) (pediatric): Secondary | ICD-10-CM

## 2018-01-30 DIAGNOSIS — I714 Abdominal aortic aneurysm, without rupture: Secondary | ICD-10-CM

## 2018-01-30 DIAGNOSIS — M069 Rheumatoid arthritis, unspecified: Secondary | ICD-10-CM

## 2018-01-30 DIAGNOSIS — Z7902 Long term (current) use of antithrombotics/antiplatelets: Secondary | ICD-10-CM | POA: Diagnosis not present

## 2018-01-30 DIAGNOSIS — Z7901 Long term (current) use of anticoagulants: Secondary | ICD-10-CM

## 2018-01-30 DIAGNOSIS — Z8042 Family history of malignant neoplasm of prostate: Secondary | ICD-10-CM

## 2018-01-30 DIAGNOSIS — E538 Deficiency of other specified B group vitamins: Secondary | ICD-10-CM

## 2018-01-30 DIAGNOSIS — I2510 Atherosclerotic heart disease of native coronary artery without angina pectoris: Secondary | ICD-10-CM

## 2018-01-30 DIAGNOSIS — N400 Enlarged prostate without lower urinary tract symptoms: Secondary | ICD-10-CM | POA: Diagnosis not present

## 2018-01-30 DIAGNOSIS — C9110 Chronic lymphocytic leukemia of B-cell type not having achieved remission: Secondary | ICD-10-CM

## 2018-01-30 DIAGNOSIS — K760 Fatty (change of) liver, not elsewhere classified: Secondary | ICD-10-CM

## 2018-01-30 LAB — CBC WITH DIFFERENTIAL/PLATELET
Abs Immature Granulocytes: 0.01 10*3/uL (ref 0.00–0.07)
Basophils Absolute: 0 10*3/uL (ref 0.0–0.1)
Basophils Relative: 1 %
Eosinophils Absolute: 0.1 10*3/uL (ref 0.0–0.5)
Eosinophils Relative: 2 %
HCT: 40.9 % (ref 39.0–52.0)
Hemoglobin: 13.9 g/dL (ref 13.0–17.0)
Immature Granulocytes: 0 %
Lymphocytes Relative: 20 %
Lymphs Abs: 0.9 10*3/uL (ref 0.7–4.0)
MCH: 32.3 pg (ref 26.0–34.0)
MCHC: 34 g/dL (ref 30.0–36.0)
MCV: 95.1 fL (ref 80.0–100.0)
Monocytes Absolute: 0.5 10*3/uL (ref 0.1–1.0)
Monocytes Relative: 12 %
NRBC: 0 % (ref 0.0–0.2)
Neutro Abs: 2.9 10*3/uL (ref 1.7–7.7)
Neutrophils Relative %: 65 %
Platelets: 99 10*3/uL — ABNORMAL LOW (ref 150–400)
RBC: 4.3 MIL/uL (ref 4.22–5.81)
RDW: 13.2 % (ref 11.5–15.5)
WBC: 4.4 10*3/uL (ref 4.0–10.5)

## 2018-01-30 LAB — COMPREHENSIVE METABOLIC PANEL
ALT: 30 U/L (ref 0–44)
AST: 32 U/L (ref 15–41)
Albumin: 4.2 g/dL (ref 3.5–5.0)
Alkaline Phosphatase: 101 U/L (ref 38–126)
Anion gap: 7 (ref 5–15)
BUN: 17 mg/dL (ref 8–23)
CO2: 24 mmol/L (ref 22–32)
CREATININE: 0.78 mg/dL (ref 0.61–1.24)
Calcium: 8.9 mg/dL (ref 8.9–10.3)
Chloride: 108 mmol/L (ref 98–111)
GFR calc Af Amer: >60 mL/min (ref >60)
GFR calc non Af Amer: >60 mL/min (ref >60)
Glucose, Bld: 107 mg/dL — ABNORMAL HIGH (ref 70–99)
POTASSIUM: 3.8 mmol/L (ref 3.5–5.1)
Sodium: 139 mmol/L (ref 135–145)
TOTAL PROTEIN: 7.2 g/dL (ref 6.5–8.1)
Total Bilirubin: 1.4 mg/dL — ABNORMAL HIGH (ref 0.3–1.2)

## 2018-01-30 NOTE — Progress Notes (Signed)
Patient here for follow up. No concerns voiced.  °

## 2018-01-30 NOTE — Progress Notes (Signed)
Hematology/Oncology Follow up note [facility] Telephone:([phone] [phone]   Patient Care Team: [name], MD as PCP - General (Internal Medicine) Earlie Server, MD as Medical Oncologist (Medical Oncology)  REFERRING PROVIDER: [name], MD  REASON FOR VISIT Follow up for management of CLL HISTORY OF PRESENTING ILLNESS:  Martin Adkins is a  73 y.o.  male with PMH listed below who was referred to me for evaluation of lymphocytosis.  Recent lab work on 03/05/2017 showed wbc 11.4, hb 14.6, platelet count 117,000, lymphocytosis 63.5%, neutrophil 22%, Report chronic fatigue, no weight loss, fever or chills.  He reports feeling chest "rattleness". Has for CT chest which showed acute finding, chronic finding includes  postsurgical changes consistent with coronary bypass grafting. One of the bypass grafts arising from the aorta shows apparent thrombosis and an area of distal aneurysmal dilatation which is also Thrombosed. Right adrenal adenoma stable in appearance.Mild scarring in the lung bases.Fatty liver.   Takes Aspirin 18m, coumadine, plavix. He is on anticoagulation for previous history of clots.  Denies any bleeding events. Use to drink hard liquir daily, quitted for 4-5 years. Current drinks wine on weekend.    Image Studies # 04/04/2017  [place] showed 1.  Splenomegaly.  No focal splenic lesions evident.2. Increased liver echogenicity, a finding most likely indicative of hepatic steatosis. While no focal liver lesions are evident on this study, it must be cautioned that the sensitivity of ultrasound for detection of focal liver lesions is diminished in this circumstance.3. Pancreas obscured by gas. Portions of inferior vena cava obscured by gas.4.  Gallbladder absent.5. Status post abdominal aortic aneurysm repair. No periaortic fluid.  # 04/21/2017 PET scan 1. Moderate splenomegaly with uniform splenic hypermetabolism,compatible  with the provided history of lymphoproliferative disease.No additional hypermetabolic sites of lymphoproliferative disease. Specifically, no hypermetabolic lymphadenopathy Hepatic steatosis, aortic atherosclerosis. Aneurysm.    # CLL, stage IV disease given spelencemagly, thrombocytopenia, symptomatic with fatigue and weight loss.  CLL IPL score 4, high risk.   # status post abdominal aortic aneurysm repair on 09/25/2017  INTERVAL HISTORY Martin JAHMET SCHANKis a 73y.o. male who has above presents for assessing toxicities from Venetoclax for treatment for CLL.  He has been off venetoclax since 11/11/2017. Recently s/p cardiac cath which revealed patent LIMA to the LAD, no significant disease in the RCA with a 90% stenosis in a small RV marginal branch.  Occluded left circumflex, occluded LAD.  Saphenous vein grafts are all occluded.  RCA does not require grafting.  LAD is well grafted by the left internal mammary.  Medical management  On Aspirin 844m  Denies hematochezia, hematuria, hematemesis, epistaxis, black tarry stool or easy bruising.    Review of Systems  Constitutional: Positive for fatigue. Negative for appetite change, chills, fever and unexpected weight change.  HENT:   Negative for hearing loss and voice change.   Eyes: Negative for eye problems and icterus.  Respiratory: Negative for chest tightness, cough and shortness of breath.   Cardiovascular: Negative for chest pain and leg swelling.  Gastrointestinal: Negative for abdominal distention and abdominal pain.  Endocrine: Negative for hot flashes.  Genitourinary: Negative for difficulty urinating, dysuria and frequency.   Musculoskeletal: Negative for arthralgias.  Skin: Negative for itching and rash.  Neurological: Negative for light-headedness and numbness.  Hematological: Negative for adenopathy. Bruises/bleeds easily.  Psychiatric/Behavioral: Negative for confusion.     MEDICAL HISTORY:  Past Medical History:    Diagnosis Date  .  AAA (abdominal aortic aneurysm) without rupture ([place]) 04/05/2014  . AAA (abdominal aortic aneurysm) without rupture ([place]) 04/05/2014  . Abdominal aortic aneurysm without rupture ([place])   . Acute non-ST elevation myocardial infarction (NSTEMI) ([place]) 08/15/2014  . 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/28/2014  . APS (antiphospholipid syndrome) ([place])   . Arteriosclerosis of coronary artery 04/05/2014   Overview:  Sp cabg with lima to lad svg to d1 om1 rca 2004 with occluded svg to rca and d1 and pci stetn of om1 graft 2015   . Arthritis   . B12 deficiency 03/21/2017  . Barrett's esophagus   . Benign essential HTN 05/02/2014  . BPH (benign prostatic hyperplasia)   . CAD (coronary artery disease)   . CHF (congestive heart failure) ([place])   . Chronic systolic heart failure ([place]) 04/19/2014   Overview:  With segmental LV systolic dysfunction ejection fraction of 35%   . CLL (chronic lymphocytic leukemia) ([place])   . CLL (chronic lymphocytic leukemia) ([place]) 05/24/2017  . DVT (deep venous thrombosis) ([place])   . Dysrhythmia    ATRIAL FIB.  Marland Kitchen GERD (gastroesophageal reflux disease)   . History of hiatal hernia   . History of shingles 2013  . Hyperlipemia   . Hyperplastic polyps of stomach   . Hypertension   . Myocardial infarction ([place]) 07/30/2014  . NSTEMI (non-ST elevated myocardial infarction) ([place])   . OSA (obstructive sleep apnea)   . Osteoarthritis   . Pre-diabetes   . PVD (peripheral vascular disease) ([place])   . RA (rheumatoid arthritis) ([place])   . SOB (shortness of breath)     SURGICAL HISTORY: Past Surgical History:  Procedure Laterality Date  . ABDOMINAL AORTA STENT    . CHOLECYSTECTOMY    . COLONOSCOPY  11/24/2009  . CORONARY ANGIOPLASTY WITH STENT PLACEMENT  2015  . CORONARY ARTERY BYPASS GRAFT    . ENDOVASCULAR REPAIR/STENT GRAFT N/A 09/24/2017   Procedure: ENDOVASCULAR REPAIR/STENT GRAFT;  Surgeon: Algernon Huxley, MD;  Location: [facility];  Service:  Cardiovascular;  Laterality: N/A;  . ESOPHAGOGASTRODUODENOSCOPY  05/26/02  03/23/12  . ESOPHAGOGASTRODUODENOSCOPY (EGD) WITH PROPOFOL N/A 10/28/2016   Procedure: ESOPHAGOGASTRODUODENOSCOPY (EGD) WITH PROPOFOL;  Surgeon: Manya Silvas, MD;  Location: [facility] ENDOSCOPY;  Service: Endoscopy;  Laterality: N/A;  . LEFT HEART CATH AND CORONARY ANGIOGRAPHY N/A 01/19/2018   Procedure: LEFT HEART CATH AND CORONARY ANGIOGRAPHY;  Surgeon: Teodoro Spray, MD;  Location: [place] [facility];  Service: Cardiovascular;  Laterality: N/A;  . PERIPHERAL VASCULAR CATHETERIZATION N/A 09/06/2014   Procedure: IVC Filter Removal;  Surgeon: Katha Cabal, MD;  Location: [place] [facility];  Service: Cardiovascular;  Laterality: N/A;  . TRANSURETHRAL RESECTION OF PROSTATE N/A 02/20/2015   Procedure: TRANSURETHRAL RESECTION OF THE PROSTATE (TURP);  Surgeon: Hollice Espy, MD;  Location: [facility];  Service: Urology;  Laterality: N/A;    SOCIAL HISTORY: Social History   Socioeconomic History  . Marital status: Legally Separated    Spouse name: Not on file  . Number of children: Not on file  . Years of education: Not on file  . Highest education level: Not on file  Occupational History  . Not on file  Social Needs  . Financial resource strain: Not hard at all  . Food insecurity:    Worry: Never true    Inability: Never true  . Transportation needs:    Medical: No    Non-medical: No  Tobacco Use  . Smoking status: Former Smoker    Packs/day: 0.50  Years: 20.00    Pack years: 10.00    Types: Cigarettes  . Smokeless tobacco: Never Used  . Tobacco comment: Quit   Substance and Sexual Activity  . Alcohol use: Yes    Alcohol/week: 0.0 standard drinks    Comment: social on weekends  . Drug use: No  . Sexual activity: Never  Lifestyle  . Physical activity:    Days per week: 7 days    Minutes per session: 40 min  . Stress: Not at all  Relationships  . Social connections:    Talks on phone: More  than three times a week    Gets together: More than three times a week    Attends religious service: More than 4 times per year    Active member of club or organization: No    Attends meetings of clubs or organizations: Never    Relationship status: Separated  . Intimate partner violence:    Fear of current or ex partner: No    Emotionally abused: No    Physically abused: No    Forced sexual activity: No  Other Topics Concern  . Not on file  Social History Narrative  . Not on file    FAMILY HISTORY: Family History  Problem Relation Age of Onset  . Cancer Mother 31       breast ca  . Diabetes Mother   . Coronary artery disease Father   . Heart attack Father   . Prostate cancer Brother   . Bladder Cancer Neg Hx   . Kidney cancer Neg Hx     ALLERGIES:  is allergic to ace inhibitors; pravastatin; rosuvastatin; simvastatin; amoxicillin; niacin; and penicillins.  MEDICATIONS:  Current Outpatient Medications  Medication Sig Dispense Refill  . allopurinol (ZYLOPRIM) 300 MG tablet Take 1 tablet (300 mg total) by mouth daily. 30 tablet 0  . amLODipine (NORVASC) 10 MG tablet Take 10 mg by mouth daily.     Marland Kitchen aspirin EC 81 MG tablet Take 1 tablet (81 mg total) by mouth daily.    Marland Kitchen atorvastatin (LIPITOR) 80 MG tablet Take 80 mg by mouth daily.   11  . fluticasone (FLONASE) 50 MCG/ACT nasal spray Place 2 sprays into both nostrils daily.     Marland Kitchen gabapentin (NEURONTIN) 300 MG capsule Take 1 capsule (300 mg total) by mouth 3 (three) times daily. (Patient taking differently: Take 300 mg by mouth 2 (two) times daily. ) 90 capsule 0  . hydroxychloroquine (PLAQUENIL) 200 MG tablet Take 200 mg by mouth every other day.     . Ipratropium-Albuterol (COMBIVENT) 20-100 MCG/ACT AERS respimat Inhale 1 puff into the lungs every 6 (six) hours. 1 Inhaler 11  . isosorbide mononitrate (IMDUR) 60 MG 24 hr tablet Take 1 tablet (60 mg total) by mouth daily. 30 tablet 0  . LUBRICANT EYE DROPS 0.4-0.3 % SOLN  Apply 1 drop to eye daily as needed (dry eyes).     . metoprolol succinate (TOPROL-XL) 100 MG 24 hr tablet Take 100 mg by mouth daily.     . pantoprazole (PROTONIX) 40 MG tablet Take 40 mg by mouth daily.     . potassium chloride SA (K-DUR,KLOR-CON) 20 MEQ tablet Take 20 mEq by mouth daily.     . vitamin B-12 (CYANOCOBALAMIN) 100 MCG tablet Take 1 tablet (100 mcg total) by mouth daily. 90 tablet 1  . albuterol (PROVENTIL HFA;VENTOLIN HFA) 108 (90 Base) MCG/ACT inhaler Inhale 2 puffs into the lungs every 6 (six) hours as needed  for wheezing or shortness of breath. (Patient not taking: Reported on 01/30/2018) 1 Inhaler 0  . latanoprost (XALATAN) 0.005 % ophthalmic solution INSTILL ONE DROP IN EACH EYE AT BEDTIME    . montelukast (SINGULAIR) 10 MG tablet Take 1 tablet (10 mg total) by mouth as needed. Take before Gazyva infusion. (Patient not taking: Reported on 01/18/2018) 20 tablet 0   No current facility-administered medications for this visit.      PHYSICAL EXAMINATION: ECOG 1 Vitals:   01/30/18 1347  BP: 108/68  Pulse: 76  Resp: 18  Temp: (!) 96.8 F (36 C)  SpO2: 97%  Physical Exam  Constitutional: He is oriented to person, place, and time. No distress.  HENT:  Head: Normocephalic and atraumatic.  Nose: Nose normal.  Mouth/Throat: Oropharynx is clear and moist. No oropharyngeal exudate.  Eyes: Pupils are equal, round, and reactive to light. EOM are normal. Left eye exhibits no discharge. No scleral icterus.  Neck: Normal range of motion. Neck supple. No JVD present.  Cardiovascular: Normal rate, regular rhythm and normal heart sounds.  No murmur heard. Pulmonary/Chest: Effort normal and breath sounds normal. No respiratory distress. He has no rales. He exhibits no tenderness.  Abdominal: Soft. He exhibits no distension. There is no abdominal tenderness.  Musculoskeletal: Normal range of motion.        General: No deformity or edema.  Lymphadenopathy:    He has no cervical  adenopathy.  Neurological: He is alert and oriented to person, place, and time. No cranial nerve deficit. He exhibits normal muscle tone. Coordination normal.  Skin: Skin is warm and dry. He is not diaphoretic. No erythema.  Psychiatric: Affect normal.  Nursing note and vitals reviewed.    LABORATORY DATA:  I have reviewed the data as listed Lab Results  Component Value Date   WBC 4.4 01/30/2018   HGB 13.9 01/30/2018   HCT 40.9 01/30/2018   MCV 95.1 01/30/2018   PLT 99 (L) 01/30/2018   Recent Labs    04/09/17 1155  01/08/18 1112 01/15/18 0828 01/18/18 1308 01/30/18 1330  NA  --    < > 139 140 137 139  K  --    < > 3.4* 3.2* 3.5 3.8  CL  --    < > 105 103 103 108  CO2  --    < > '25 26 24 24  ' GLUCOSE  --    < > 143* 105* 153* 107*  BUN  --    < > '15 14 20 17  ' CREATININE  --    < > 0.81 0.86 0.93 0.78  CALCIUM  --    < > 9.1 9.1 9.1 8.9  GFRNONAA  --    < > >60 >60 >60 >60  GFRAA  --    < > >60 >60 >60 >60  PROT  --    < > 7.2 7.6  --  7.2  ALBUMIN  --    < > 4.2 4.5  --  4.2  AST  --    < > 27 39  --  32  ALT  --    < > 25 35  --  30  ALKPHOS  --    < > 98 111  --  101  BILITOT 1.7*   < > 1.5* 1.5*  --  1.4*  BILIDIR 0.3  --   --   --   --   --    < > = values in this interval  not displayed.   Hepatitis panel negative.  LDH 228, mildly elevated Beta 2 microglobulin normal.   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 The morphologic and immunophenotypic features are most consistent with chronic lymphocytic leukemia/small lymphocytic lymphoma. Bone marrow Cytogenetics : normal.  Peripheral blood FISH panel negative for CCND1- IGH mutation, ATM, 12, 13q, Tp53 mutation.   RADIOGRAPHIC STUDIES: I have personally reviewed the radiological images as listed and agreed with the findings in the report. CT angio abd/pelvis showed Type 1b endoleak, AAA aneurysm, Spleen has decreased in size since PET scan in April 2019.    ASSESSMENT & PLAN:  73 yo male presents for immunotherapy treatment of Stage IV CLL. 1. Thrombocytopenia ([place])   2. CLL (chronic lymphocytic leukemia) ([place])   3. 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  Platelet counts 83,000. For the CLL treatment, continue hold venetoclax. Platelet counts have improved to 99,000.  Will wait another 4 weeks to allow full recover of platelet counts before restarting on dose reduced venetoclax.   # He was on Xarelto 43m daily for chronic A fib.  I have discussed with Dr. FUbaldo Glassingabout his anticoagulation regimen.  We agreed on resuming Xarelto 20 mg daily and stop aspirin. I called patient and updated about this recommendation. # History of vitamin B12 deficiency. Recommend patient to take vitamin B12 10057m daily.  Check B12 level at next visit.   Follow-up in the clinic in 4 weeks for further assessment.    ZhEarlie ServerMD, PhD Hematology Oncology CoSan [facility] In[facility] Center[facility]- [phone]/24/2020

## 2018-02-02 ENCOUNTER — Telehealth: Payer: Self-pay | Admitting: *Deleted

## 2018-02-02 MED ORDER — CYANOCOBALAMIN 1000 MCG PO CAPS: 1000.0000 ug | ORAL_CAPSULE | Freq: Every day | ORAL | 0 refills | Status: DC

## 2018-02-02 MED ORDER — RIVAROXABAN 20 MG PO TABS: 20.0000 mg | ORAL_TABLET | Freq: Every day | ORAL | 3 refills | Status: DC

## 2018-02-02 NOTE — Telephone Encounter (Signed)
Rx sent to pharmacy for 1,000 mcg daily per Dr Tasia Catchings

## 2018-02-02 NOTE — Telephone Encounter (Signed)
Patient called reporting that a prescription was to be sent to [facility] Friday for B 12 500 mcg and it is not there. Please advise

## 2018-02-03 DIAGNOSIS — I214 Non-ST elevation (NSTEMI) myocardial infarction: Secondary | ICD-10-CM | POA: Diagnosis not present

## 2018-02-03 NOTE — Progress Notes (Signed)
Daily Session Note  Patient Details  Name: Abdur J Len MRN: [id number] Date of Birth: 04/06/1945 Referring Provider:     Cardiac Rehab from 01/29/2018 in [facility]  Referring Provider  [name]      Encounter Date: 02/03/2018  Check In: Session Check In - 02/03/18 0755      Check-In   Supervising physician immediately available to respond to emergencies  See telemetry face sheet for immediately available ER MD    Location  [facility]    Staff Present  Krista Spencer, RN BSN;Jeanna Durrell BS, Exercise Physiologist;Jessica Hawkins, MA, RCEP, CCRP, Exercise Physiologist;Amanda Sommer, BA, ACSM CEP, Exercise Physiologist          Social History   Tobacco Use  Smoking Status Former Smoker  . Packs/day: 0.50  . Years: 20.00  . Pack years: 10.00  . Types: Cigarettes  Smokeless Tobacco Never Used  Tobacco Comment   Quit     Goals Met:  Independence with exercise equipment Exercise tolerated well No report of cardiac concerns or symptoms Strength training completed today  Goals Unmet:  Not Applicable  Comments:First full day of exercise!  Patient was oriented to gym and equipment including functions, settings, policies, and procedures.  Patient's individual exercise prescription and treatment plan were reviewed.  All starting workloads were established based on the results of the 6 minute walk test done at initial orientation visit.  The plan for exercise progression was also introduced and progression will be customized based on patient's performance and goals.   Dr. Mark Miller is Medical Director for [facility] and [facility]. 

## 2018-02-04 DIAGNOSIS — I214 Non-ST elevation (NSTEMI) myocardial infarction: Secondary | ICD-10-CM

## 2018-02-04 NOTE — Discharge Summary (Signed)
[facility] NAME: Martin Adkins    MR#:  [id number]  DATE OF BIRTH:  17-Nov-1945  DATE OF ADMISSION:  01/18/2018 ADMITTING PHYSICIAN: Dustin Flock, MD  DATE OF DISCHARGE: 01/20/2018 11:37 AM  PRIMARY CARE PHYSICIAN: [name], MD   ADMISSION DIAGNOSIS:  Precordial pain [R07.2] NSTEMI (non-ST elevated myocardial infarction) ([place]) [I21.4]  DISCHARGE DIAGNOSIS:  Active Problems:   Non-ST elevation MI (NSTEMI) ([place])   SECONDARY DIAGNOSIS:   Past Medical History:  Diagnosis Date  . AAA (abdominal aortic aneurysm) without rupture ([place]) 04/05/2014  . AAA (abdominal aortic aneurysm) without rupture ([place]) 04/05/2014  . Abdominal aortic aneurysm without rupture ([place])   . Acute non-ST elevation myocardial infarction (NSTEMI) ([place]) 08/15/2014  . 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/28/2014  . APS (antiphospholipid syndrome) ([place])   . Arteriosclerosis of coronary artery 04/05/2014   Overview:  Sp cabg with lima to lad svg to d1 om1 rca 2004 with occluded svg to rca and d1 and pci stetn of om1 graft 2015   . Arthritis   . B12 deficiency 03/21/2017  . Barrett's esophagus   . Benign essential HTN 05/02/2014  . BPH (benign prostatic hyperplasia)   . CAD (coronary artery disease)   . CHF (congestive heart failure) ([place])   . Chronic systolic heart failure ([place]) 04/19/2014   Overview:  With segmental LV systolic dysfunction ejection fraction of 35%   . CLL (chronic lymphocytic leukemia) ([place])   . CLL (chronic lymphocytic leukemia) ([place]) 05/24/2017  . DVT (deep venous thrombosis) ([place])   . Dysrhythmia    ATRIAL FIB.  Marland Kitchen GERD (gastroesophageal reflux disease)   . History of hiatal hernia   . History of shingles 2013  . Hyperlipemia   . Hyperplastic polyps of stomach   . Hypertension   . Myocardial infarction ([place]) 07/30/2014  . NSTEMI (non-ST elevated myocardial infarction) ([place])   . OSA (obstructive sleep apnea)   . Osteoarthritis   .  Pre-diabetes   . PVD (peripheral vascular disease) ([place])   . RA (rheumatoid arthritis) ([place])   . SOB (shortness of breath)      ADMITTING HISTORY  HISTORY OF PRESENT ILLNESS: Martin Adkins  is a 73 y.o. male with a known history of coronary artery disease with stent in the past, essential hypertension, BPH, chronic systolic congestive heart failure, CLL and thrombocytopenia, sleep apnea presenting with chest pain since this morning.  Patient states that the pain is on the left side of his chest dull ache.  Worst with activity but also occurs at rest.  He also has shortness of breath.  Also pain radiating to the left arm.  Denies any pain radiation to his jaw.  No nausea vomiting.  HOSPITAL COURSE:   *Non-ST elevation MI Patient was admitted to medical floor and started on heparin drip.  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  On aspirin, statin and beta-blocker.  Cardiac catheterization with no occlusion amenable to PCI.  Discussed with Dr. Ubaldo Glassing.  Discharged home once chest pain resolved.  He was continued on aspirin due to mild thrombocytopenia.  He was on Xarelto in the past with atrial fibrillation and this was stopped prior to admission.  Patient will follow-up with cardiology, oncology and primary care physician.  Cardiac rehab referral done.  Discharge instructions given.  Discharged home.  CONSULTS OBTAINED:  Treatment Team:  Teodoro Spray, MD  DRUG ALLERGIES:   Allergies  Allergen Reactions  .  Ace Inhibitors Other (See Comments)    Other reaction(s): Cough  . Pravastatin Other (See Comments)    Other reaction(s): Muscle Pain  . Rosuvastatin Other (See Comments)    Other reaction(s): Other (See Comments) GI bleed  . Simvastatin Other (See Comments)    Other reaction(s): Muscle Pain  . Amoxicillin Rash  . Niacin Rash  . Penicillins Rash    Has patient had a PCN reaction causing immediate rash, facial/tongue/throat swelling, SOB or  lightheadedness with hypotension: Yes Has patient had a PCN reaction causing severe rash involving mucus membranes or skin necrosis: Unknown Has patient had a PCN reaction that required hospitalization: Unknown Has patient had a PCN reaction occurring within the last 10 years: No If all of the above answers are "NO", then may proceed with Cephalosporin use.    DISCHARGE MEDICATIONS:   Allergies as of 01/20/2018      Reactions   Ace Inhibitors Other (See Comments)   Other reaction(s): Cough   Pravastatin Other (See Comments)   Other reaction(s): Muscle Pain   Rosuvastatin Other (See Comments)   Other reaction(s): Other (See Comments) GI bleed   Simvastatin Other (See Comments)   Other reaction(s): Muscle Pain   Amoxicillin Rash   Niacin Rash   Penicillins Rash   Has patient had a PCN reaction causing immediate rash, facial/tongue/throat swelling, SOB or lightheadedness with hypotension: Yes Has patient had a PCN reaction causing severe rash involving mucus membranes or skin necrosis: Unknown Has patient had a PCN reaction that required hospitalization: Unknown Has patient had a PCN reaction occurring within the last 10 years: No If all of the above answers are "NO", then may proceed with Cephalosporin use.      Medication List    STOP taking these medications   rivaroxaban 20 MG Tabs tablet Commonly known as:  XARELTO   venetoclax 100 MG Tabs     TAKE these medications   albuterol 108 (90 Base) MCG/ACT inhaler Commonly known as:  PROVENTIL HFA;VENTOLIN HFA Inhale 2 puffs into the lungs every 6 (six) hours as needed for wheezing or shortness of breath.   allopurinol 300 MG tablet Commonly known as:  ZYLOPRIM Take 1 tablet (300 mg total) by mouth daily.   amLODipine 10 MG tablet Commonly known as:  NORVASC Take 10 mg by mouth daily.   atorvastatin 80 MG tablet Commonly known as:  LIPITOR Take 80 mg by mouth daily.   fluticasone 50 MCG/ACT nasal spray Commonly  known as:  FLONASE Place 2 sprays into both nostrils daily.   gabapentin 300 MG capsule Commonly known as:  NEURONTIN Take 1 capsule (300 mg total) by mouth 3 (three) times daily. What changed:  when to take this   hydroxychloroquine 200 MG tablet Commonly known as:  PLAQUENIL Take 200 mg by mouth every other day.   Ipratropium-Albuterol 20-100 MCG/ACT Aers respimat Commonly known as:  COMBIVENT Inhale 1 puff into the lungs every 6 (six) hours.   isosorbide mononitrate 60 MG 24 hr tablet Commonly known as:  IMDUR Take 1 tablet (60 mg total) by mouth daily. What changed:    medication strength  how much to take   LUBRICANT EYE DROPS 0.4-0.3 % Soln Generic drug:  Polyethyl Glycol-Propyl Glycol Apply 1 drop to eye daily as needed (dry eyes).   metoprolol succinate 100 MG 24 hr tablet Commonly known as:  TOPROL-XL Take 100 mg by mouth daily.   montelukast 10 MG tablet Commonly known as:  SINGULAIR Take 1  tablet (10 mg total) by mouth as needed. Take before Gazyva infusion.   pantoprazole 40 MG tablet Commonly known as:  PROTONIX Take 40 mg by mouth daily.   potassium chloride SA 20 MEQ tablet Commonly known as:  K-DUR,KLOR-CON Take 20 mEq by mouth daily.       Today   VITAL SIGNS:  Blood pressure 117/80, pulse 71, temperature 98.6 F (37 C), temperature source Oral, resp. rate 18, height 6' (1.829 m), weight 95.8 kg, SpO2 98 %.  I/O:  No intake or output data in the 24 hours ending 02/04/18 1357  PHYSICAL EXAMINATION:  Physical Exam  GENERAL:  73 y.o.-year-old patient lying in the bed with no acute distress.  LUNGS: Normal breath sounds bilaterally, no wheezing, rales,rhonchi or crepitation. No use of accessory muscles of respiration.  CARDIOVASCULAR: S1, S2 normal. No murmurs, rubs, or gallops.  ABDOMEN: Soft, non-tender, non-distended. Bowel sounds present. No organomegaly or mass.  NEUROLOGIC: Moves all 4 extremities. PSYCHIATRIC: The patient is alert  and oriented x 3.  SKIN: No obvious rash, lesion, or ulcer.   DATA REVIEW:   CBC Recent Labs  Lab 01/30/18 1330  WBC 4.4  HGB 13.9  HCT 40.9  PLT 99*    Chemistries  Recent Labs  Lab 01/30/18 1330  NA 139  K 3.8  CL 108  CO2 24  GLUCOSE 107*  BUN 17  CREATININE 0.78  CALCIUM 8.9  AST 32  ALT 30  ALKPHOS 101  BILITOT 1.4*    Cardiac Enzymes No results for input(s): TROPONINI in the last 168 hours.  Microbiology Results  Results for orders placed or performed during the hospital encounter of 02/26/15  Rapid Influenza A&B Antigens [facility] only)     Status: None   Collection Time: 02/26/15  3:37 PM  Result Value Ref Range Status   Influenza A [facility]) NOT DETECTED  Final   Influenza B [facility]) NOT DETECTED  Final    RADIOLOGY:  No results found.  Follow up with PCP in 1 week.  Management plans discussed with the patient, family and they are in agreement.  CODE STATUS:  Code Status History    Date Active Date Inactive Code Status Order ID Comments User Context   01/18/2018 2016 01/20/2018 1454 Full Code [id number]  Dustin Flock, MD Inpatient   09/24/2017 1310 09/25/2017 1728 Full Code [id number]  Algernon Huxley, MD Inpatient   09/06/2014 1155 09/06/2014 1620 Full Code [id number]  Schnier, Dolores Lory, MD Inpatient    Advance Directive Documentation     Most Recent Value  Type of Advance Directive  Healthcare Power of [place], Living will  Pre-existing out of facility DNR order (yellow form or pink MOST form)  -  "MOST" Form in Place?  -      TOTAL TIME TAKING CARE OF THIS PATIENT ON DAY OF DISCHARGE: more than 30 minutes.   Leia Alf Dalisha Shively M.D on 02/04/2018 at 1:57 PM  Between 7am to 6pm - Pager - [phone]  After 6pm go to [email or web address] - password [email or web address] [facility]  Office  [phone]  CC: Primary care physician; [name], MD  Note: This dictation was prepared with Dragon dictation along with smaller phrase technology. Any  transcriptional errors that result from this process are unintentional.

## 2018-02-04 NOTE — Progress Notes (Signed)
Cardiac Individual Treatment Plan  Patient Details  Name: Martin Adkins MRN: [id number] Date of Birth: 12/24/1945 Referring Provider:     Cardiac Rehab from 01/29/2018 in [facility] Cardiac and Pulmonary Rehab  Referring Provider  [name]      Initial Encounter Date:    Cardiac Rehab from 01/29/2018 in [facility] Cardiac and Pulmonary Rehab  Date  01/29/18      Visit Diagnosis: NSTEMI (non-ST elevated myocardial infarction) [facility])  Patient's Home Medications on Admission:  Current Outpatient Medications:  .  albuterol (PROVENTIL HFA;VENTOLIN HFA) 108 (90 Base) MCG/ACT inhaler, Inhale 2 puffs into the lungs every 6 (six) hours as needed for wheezing or shortness of breath. (Patient not taking: Reported on 01/30/2018), Disp: 1 Inhaler, Rfl: 0 .  allopurinol (ZYLOPRIM) 300 MG tablet, Take 1 tablet (300 mg total) by mouth daily., Disp: 30 tablet, Rfl: 0 .  amLODipine (NORVASC) 10 MG tablet, Take 10 mg by mouth daily. , Disp: , Rfl:  .  atorvastatin (LIPITOR) 80 MG tablet, Take 80 mg by mouth daily. , Disp: , Rfl: 11 .  Cyanocobalamin 1000 MCG CAPS, Take 1,000 mcg by mouth daily., Disp: 90 capsule, Rfl: 0 .  fluticasone (FLONASE) 50 MCG/ACT nasal spray, Place 2 sprays into both nostrils daily. , Disp: , Rfl:  .  gabapentin (NEURONTIN) 300 MG capsule, Take 1 capsule (300 mg total) by mouth 3 (three) times daily. (Patient taking differently: Take 300 mg by mouth 2 (two) times daily. ), Disp: 90 capsule, Rfl: 0 .  hydroxychloroquine (PLAQUENIL) 200 MG tablet, Take 200 mg by mouth every other day. , Disp: , Rfl:  .  Ipratropium-Albuterol (COMBIVENT) 20-100 MCG/ACT AERS respimat, Inhale 1 puff into the lungs every 6 (six) hours., Disp: 1 Inhaler, Rfl: 11 .  isosorbide mononitrate (IMDUR) 60 MG 24 hr tablet, Take 1 tablet (60 mg total) by mouth daily., Disp: 30 tablet, Rfl: 0 .  latanoprost (XALATAN) 0.005 % ophthalmic solution, INSTILL ONE DROP IN [facility] EYE AT BEDTIME, Disp: , Rfl:  .  LUBRICANT EYE DROPS  0.4-0.3 % SOLN, Apply 1 drop to eye daily as needed (dry eyes). , Disp: , Rfl:  .  metoprolol succinate (TOPROL-XL) 100 MG 24 hr tablet, Take 100 mg by mouth daily. , Disp: , Rfl:  .  montelukast (SINGULAIR) 10 MG tablet, Take 1 tablet (10 mg total) by mouth as needed. Take before Gazyva infusion. (Patient not taking: Reported on 01/18/2018), Disp: 20 tablet, Rfl: 0 .  pantoprazole (PROTONIX) 40 MG tablet, Take 40 mg by mouth daily. , Disp: , Rfl:  .  potassium chloride SA (K-DUR,KLOR-CON) 20 MEQ tablet, Take 20 mEq by mouth daily. , Disp: , Rfl:  .  rivaroxaban (XARELTO) 20 MG TABS tablet, Take 1 tablet (20 mg total) by mouth daily with supper., Disp: 30 tablet, Rfl: 3  Past Medical History: Past Medical History:  Diagnosis Date  . AAA (abdominal aortic aneurysm) without rupture ([place]) 04/05/2014  . AAA (abdominal aortic aneurysm) without rupture ([place]) 04/05/2014  . Abdominal aortic aneurysm without rupture ([place])   . Acute non-ST elevation myocardial infarction (NSTEMI) ([place]) 08/15/2014  . 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/28/2014  . APS (antiphospholipid syndrome) ([place])   . Arteriosclerosis of coronary artery 04/05/2014   Overview:  Sp cabg with lima to lad svg to d1 om1 rca 2004 with occluded svg to rca and d1 and pci stetn of om1 graft 2015   . Arthritis   . B12 deficiency 03/21/2017  . Barrett's esophagus   .  Benign essential HTN 05/02/2014  . BPH (benign prostatic hyperplasia)   . CAD (coronary artery disease)   . CHF (congestive heart failure) ([place])   . Chronic systolic heart failure ([place]) 04/19/2014   Overview:  With segmental LV systolic dysfunction ejection fraction of 35%   . CLL (chronic lymphocytic leukemia) ([place])   . CLL (chronic lymphocytic leukemia) ([place]) 05/24/2017  . DVT (deep venous thrombosis) ([place])   . Dysrhythmia    ATRIAL FIB.  Marland Kitchen GERD (gastroesophageal reflux disease)   . History of hiatal hernia   . History of shingles 2013  . Hyperlipemia   . Hyperplastic polyps of  stomach   . Hypertension   . Myocardial infarction ([place]) 07/30/2014  . NSTEMI (non-ST elevated myocardial infarction) ([place])   . OSA (obstructive sleep apnea)   . Osteoarthritis   . Pre-diabetes   . PVD (peripheral vascular disease) ([place])   . RA (rheumatoid arthritis) ([place])   . SOB (shortness of breath)     Tobacco Use: Social History   Tobacco Use  Smoking Status Former Smoker  . Packs/day: 0.50  . Years: 20.00  . Pack years: 10.00  . Types: Cigarettes  Smokeless Tobacco Never Used  Tobacco Comment   Quit     Labs: Recent Review Flowsheet Data    Labs for ITP Cardiac and Pulmonary Rehab Latest Ref Rng & Units 05/03/2013 01/19/2018   Cholestrol 0 - 200 mg/dL 196 116   LDLCALC 0 - 99 mg/dL 121(H) 49   HDL >40 mg/dL 42 51   Trlycerides <150 mg/dL 164 81       Exercise Target Goals: Exercise Program Goal: Individual exercise prescription set using results from initial 6 min walk test and THRR while considering  patient's activity barriers and safety.   Exercise Prescription Goal: Initial exercise prescription builds to 30-45 minutes a day of aerobic activity, 2-3 days per week.  Home exercise guidelines will be given to patient during program as part of exercise prescription that the participant will acknowledge.  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 See MD 1/24 to see next step in care.  SOB with exertion, fatigued. Was receiving Chiroparactic Care until stopped by Cancer MD for low platelet count   Cardiac Risk Stratification  High       6 Minute Walk: 6 Minute Walk    Row Name 01/29/18 1438         6 Minute Walk   Phase  Initial     Distance  1235 feet     Walk Time  6 minutes     # of Rest Breaks  0     MPH  2.34     METS  2.8      RPE  11     Perceived Dyspnea   0     VO2 Peak  9.78     Symptoms  Yes (comment)     Comments  L hip pain due to arthritis 2/10     Resting HR  79 bpm     Resting BP  126/70     Exercise Oxygen Saturation  during 6 min walk  97 %     Max Ex. HR  109 bpm     Max Ex. BP  120/68  2 Minute Post BP  122/70        Oxygen Initial Assessment:   Oxygen Re-Evaluation:   Oxygen Discharge (Final Oxygen Re-Evaluation):   Initial Exercise Prescription: Initial Exercise Prescription - 01/29/18 1400      Date of Initial Exercise RX and Referring Provider   Date  01/29/18    Referring Provider  [name]      Treadmill   MPH  2    Grade  1    Minutes  15    METs  2.8      Recumbant Bike   Level  2    RPM  60    Watts  24    Minutes  15      NuStep   Level  3    SPM  80    Minutes  15    METs  2.8      Prescription Details   Frequency (times per week)  3    Duration  Progress to 30 minutes of continuous aerobic without signs/symptoms of physical distress      Intensity   THRR 40-80% of Max Heartrate  107-136    Ratings of Perceived Exertion  11-15    Perceived Dyspnea  0-4      Resistance Training   Training Prescription  Yes    Weight  3 lb    Reps  10-15       Perform Capillary Blood Glucose checks as needed.  Exercise Prescription Changes: Exercise Prescription Changes    Row Name 01/29/18 1400             Response to Exercise   Blood Pressure (Admit)  126/70       Blood Pressure (Exercise)  120/68       Blood Pressure (Exit)  122/70       Heart Rate (Admit)  79 bpm       Heart Rate (Exercise)  109 bpm       Heart Rate (Exit)  77 bpm       Oxygen Saturation (Exit)  97 %       Rating of Perceived Exertion (Exercise)  11          Exercise Comments: Exercise Comments    Row Name 02/03/18 0757           Exercise Comments  First full day of exercise!  Patient was oriented to gym and equipment including functions, settings, policies, and  procedures.  Patient's individual exercise prescription and treatment plan were reviewed.  All starting workloads were established based on the results of the 6 minute walk test done at initial orientation visit.  The plan for exercise progression was also introduced and progression will be customized based on patient's performance and goals.          Exercise Goals and Review: Exercise Goals    Row Name 01/29/18 1437             Exercise Goals   Increase Physical Activity  Yes       Intervention  Provide advice, education, support and counseling about physical activity/exercise needs.;Develop an individualized exercise prescription for aerobic and resistive training based on initial evaluation findings, risk stratification, comorbidities and participant's personal goals.       Expected Outcomes  Short Term: Attend rehab on a regular basis to increase amount of physical activity.;Long Term: Add in home exercise to make exercise part of routine and to increase amount of physical activity.;Long Term:  Exercising regularly at least 3-5 days a week.       Increase Strength and Stamina  Yes       Intervention  Provide advice, education, support and counseling about physical activity/exercise needs.;Develop an individualized exercise prescription for aerobic and resistive training based on initial evaluation findings, risk stratification, comorbidities and participant's personal goals.       Expected Outcomes  Short Term: Increase workloads from initial exercise prescription for resistance, speed, and METs.;Short Term: Perform resistance training exercises routinely during rehab and add in resistance training at home;Long Term: Improve cardiorespiratory fitness, muscular endurance and strength as measured by increased METs and functional capacity (6MWT)       Able to understand and use rate of perceived exertion (RPE) scale  Yes       Intervention  Provide education and explanation on how to use RPE  scale       Expected Outcomes  Short Term: Able to use RPE daily in rehab to express subjective intensity level;Long Term:  Able to use RPE to guide intensity level when exercising independently       Knowledge and understanding of Target Heart Rate Range (THRR)  Yes       Intervention  Provide education and explanation of THRR including how the numbers were predicted and where they are located for reference       Expected Outcomes  Short Term: Able to state/look up THRR;Short Term: Able to use daily as guideline for intensity in rehab;Long Term: Able to use THRR to govern intensity when exercising independently       Able to check pulse independently  Yes       Intervention  Provide education and demonstration on how to check pulse in carotid and radial arteries.;Review the importance of being able to check your own pulse for safety during independent exercise       Expected Outcomes  Short Term: Able to explain why pulse checking is important during independent exercise;Long Term: Able to check pulse independently and accurately       Understanding of Exercise Prescription  Yes       Intervention  Provide education, explanation, and written materials on patient's individual exercise prescription       Expected Outcomes  Short Term: Able to explain program exercise prescription;Long Term: Able to explain home exercise prescription to exercise independently          Exercise Goals Re-Evaluation : Exercise Goals Re-Evaluation    Row Name 02/03/18 0757             Exercise Goal Re-Evaluation   Exercise Goals Review  Increase Physical Activity;Able to understand and use rate of perceived exertion (RPE) scale;Knowledge and understanding of Target Heart Rate Range (THRR);Increase Strength and Stamina;Understanding of Exercise Prescription       Comments  Reviewed RPE scale, THR and program prescription with pt today.  Pt voiced understanding and was given a copy of goals to take home.         Expected Outcomes  Short: Use RPE daily to regulate intensity. Long: Follow program prescription in THR.          Discharge Exercise Prescription (Final Exercise Prescription Changes): Exercise Prescription Changes - 01/29/18 1400      Response to Exercise   Blood Pressure (Admit)  126/70    Blood Pressure (Exercise)  120/68    Blood Pressure (Exit)  122/70    Heart Rate (Admit)  79 bpm    Heart  Rate (Exercise)  109 bpm    Heart Rate (Exit)  77 bpm    Oxygen Saturation (Exit)  97 %    Rating of Perceived Exertion (Exercise)  11       Nutrition:  Target Goals: Understanding of nutrition guidelines, daily intake of sodium <1547m, cholesterol <208m calories 30% from fat and 7% or less from saturated fats, daily to have 5 or more servings of fruits and vegetables.  Biometrics: Pre Biometrics - 01/29/18 1437      Pre Biometrics   Height  5' 11.5" (1.816 m)    Weight  212 lb (96.2 kg)    Waist Circumference  42 inches    Hip Circumference  40 inches    Waist to Hip Ratio  1.05 %    BMI (Calculated)  29.16    Single Leg Stand  5.48 seconds        Nutrition Therapy Plan and Nutrition Goals: Nutrition Therapy & Goals - 01/29/18 1445      Intervention Plan   Intervention  Prescribe, educate and counsel regarding individualized specific dietary modifications aiming towards targeted core components such as weight, hypertension, lipid management, diabetes, heart failure and other comorbidities.    Expected Outcomes  Short Term Goal: Understand basic principles of dietary content, such as calories, fat, sodium, cholesterol and nutrients.;Short Term Goal: A plan has been developed with personal nutrition goals set during dietitian appointment.;Long Term Goal: Adherence to prescribed nutrition plan.       Nutrition Assessments: Nutrition Assessments - 01/29/18 1446      MEDFICTS Scores   Pre Score  53       Nutrition Goals Re-Evaluation:   Nutrition Goals Discharge (Final  Nutrition Goals Re-Evaluation):   Psychosocial: Target Goals: Acknowledge presence or absence of significant depression and/or stress, maximize coping skills, provide positive support system. Participant is able to verbalize types and ability to use techniques and skills needed for reducing stress and depression.   Initial Review & Psychosocial Screening: Initial Psych Review & Screening - 01/29/18 1441      Initial Review   Current issues with  Current Stress Concerns    Source of Stress Concerns  Unable to perform yard/household activities;Unable to participate in former interests or hobbies    Comments  SInce Chemo and cardiac issues has not been abel to do daily activies. Fatigue, SOB with exertion have been concerns.       Family Dynamics   Good Support System?  Yes   Daughter, Son and brother     Barriers   Psychosocial barriers to participate in program  There are no identifiable barriers or psychosocial needs.;The patient should benefit from training in stress management and relaxation.      Screening Interventions   Interventions  Encouraged to exercise;To provide support and resources with identified psychosocial needs;Provide feedback about the scores to participant    Expected Outcomes  Short Term goal: Utilizing psychosocial counselor, staff and physician to assist with identification of specific Stressors or current issues interfering with healing process. Setting desired goal for each stressor or current issue identified.;Long Term Goal: Stressors or current issues are controlled or eliminated.;Long Term goal: The participant improves quality of Life and PHQ9 Scores as seen by post scores and/or verbalization of changes;Short Term goal: Identification and review with participant of any Quality of Life or Depression concerns found by scoring the questionnaire.       Quality of Life Scores:  Quality of Life - 01/29/18 1443  Quality of Life Scores   Health/Function Pre   16.17 %    Socioeconomic Pre  19.64 %    Psych/Spiritual Pre  26 %    Family Pre  4.5 %    GLOBAL Pre  16.92 %      Scores of 19 and below usually indicate a poorer quality of life in these areas.  A difference of  2-3 points is a clinically meaningful difference.  A difference of 2-3 points in the total score of the Quality of Life Index has been associated with significant improvement in overall quality of life, self-image, physical symptoms, and general health in studies assessing change in quality of life.  PHQ-9: Recent Review Flowsheet Data    Depression screen [facility] 2/9 01/29/2018 01/29/2018 11/16/2014 09/26/2014   Decreased Interest - 0 2 1   Down, Depressed, Hopeless 2 0 0 0   PHQ - 2 Score 2 0 2 1   Altered sleeping - 0 0 1   Tired, decreased energy 3 0 2 1   Change in appetite - 0 0 0   Feeling bad or failure about yourself  - 0 0 0   Trouble concentrating - 0 - 0   Moving slowly or fidgety/restless - 0 0 0   Suicidal thoughts - 0 0 0   PHQ-9 Score - 0 4 3   Difficult doing work/chores Somewhat difficult - Somewhat difficult Somewhat difficult     Interpretation of Total Score  Total Score Depression Severity:  1-4 = Minimal depression, 5-9 = Mild depression, 10-14 = Moderate depression, 15-19 = Moderately severe depression, 20-27 = Severe depression   Psychosocial Evaluation and Intervention: Psychosocial Evaluation - 02/03/18 0906      Psychosocial Evaluation & Interventions   Interventions  Encouraged to exercise with the program and follow exercise prescription    Comments  Mr. Staebell ([name]) returned to this program after several years due to a recent "mild heart attack," which is being treated with Rx, Exercise and diet.  He is a 73 year old who has a good support system with a daughter, son and brother who live locally.  He is in good health mostly except his arthritis and his heart condition.  [name] reports sleeping well and has a good appetite.  He denies a history of  depression or anxiety and reports typically being in a positive mood.  Other than his health, he states nothing else is stressful for him currently.  [name] has goals to increase his stamina and strength and be able to maintain this with consistent exercise.  He plans to return to [employer] here at [facility] gym following completion of this program.  Staff will follow with [name].    Expected Outcomes  Short:  [name] will exercise consistently to regain his stamina and strength and be more heart healthy.  Long:  [name] will maintain his exercise routine for his overall health.      Continue Psychosocial Services   Follow up required by staff       Psychosocial Re-Evaluation:   Psychosocial Discharge (Final Psychosocial Re-Evaluation):   Vocational Rehabilitation: Provide vocational rehab assistance to qualifying candidates.   Vocational Rehab Evaluation & Intervention: Vocational Rehab - 01/29/18 1446      Initial Vocational Rehab Evaluation & Intervention   Assessment shows need for Vocational Rehabilitation  No       Education: Education Goals: Education classes will be provided on a variety of topics geared toward better understanding of  heart health and risk factor modification. Participant will state understanding/return demonstration of topics presented as noted by education test scores.  Learning Barriers/Preferences: Learning Barriers/Preferences - 01/29/18 1446      Learning Barriers/Preferences   Learning Barriers  None    Learning Preferences  None       Education Topics:  AED/CPR: - Group verbal and written instruction with the use of models to demonstrate the basic use of the AED with the basic ABC's of resuscitation.   General Nutrition Guidelines/Fats and Fiber: -Group instruction provided by verbal, written material, models and posters to present the general guidelines for heart healthy nutrition. Gives an explanation and review of dietary fats and fiber.   Cardiac  Rehab from 11/21/2014 in [facility] Cardiac and Pulmonary Rehab  Date  10/17/14  Educator  PI  Instruction Review Code (retired)  2- meets goals/outcomes      Controlling Sodium/Reading Food Labels: -Group verbal and written material supporting the discussion of sodium use in heart healthy nutrition. Review and explanation with models, verbal and written materials for utilization of the food label.   Cardiac Rehab from 11/21/2014 in [facility] Cardiac and Pulmonary Rehab  Date  10/31/14  Educator  CR  Instruction Review Code (retired)  2- meets goals/outcomes      Exercise Physiology & General Exercise Guidelines: - Group verbal and written instruction with models to review the exercise physiology of the cardiovascular system and associated critical values. Provides general exercise guidelines with specific guidelines to those with heart or lung disease.    Cardiac Rehab from 11/21/2014 in [facility] Cardiac and Pulmonary Rehab  Date  11/09/14  Educator  RM  Instruction Review Code (retired)  2- meets goals/outcomes      Aerobic Exercise & Resistance Training: - Gives group verbal and written instruction on the various components of exercise. Focuses on aerobic and resistive training programs and the benefits of this training and how to safely progress through these programs..   Cardiac Rehab from 11/21/2014 in [facility] Cardiac and Pulmonary Rehab  Date  11/14/14  Educator  RM  Instruction Review Code (retired)  2- Statistician, Balance, Mind/Body Relaxation: Provides group verbal/written instruction on the benefits of flexibility and balance training, including mind/body exercise modes such as yoga, pilates and tai chi.  Demonstration and skill practice provided.   Cardiac Rehab from 02/03/2018 in [facility] Cardiac and Pulmonary Rehab  Date  02/03/18  Educator  AS  Instruction Review Code  1- Verbalizes Understanding      Stress and Anxiety: - Provides group verbal and  written instruction about the health risks of elevated stress and causes of high stress.  Discuss the correlation between heart/lung disease and anxiety and treatment options. Review healthy ways to manage with stress and anxiety.   Depression: - Provides group verbal and written instruction on the correlation between heart/lung disease and depressed mood, treatment options, and the stigmas associated with seeking treatment.   Cardiac Rehab from 11/21/2014 in [facility] Cardiac and Pulmonary Rehab  Date  11/02/14  Educator  [facility]  Instruction Review Code (retired)  2- meets Designer, fashion/clothing & Physiology of the Heart: - Group verbal and written instruction and models provide basic cardiac anatomy and physiology, with the coronary electrical and arterial systems. Review of Valvular disease and Heart Failure   Cardiac Rehab from 11/21/2014 in [facility] Cardiac and Pulmonary Rehab  Date  10/03/14  Educator  SB  Instruction Review  Code (retired)  2- meets goals/outcomes      Cardiac Procedures: - Group verbal and written instruction to review commonly prescribed medications for heart disease. Reviews the medication, class of the drug, and side effects. Includes the steps to properly store meds and maintain the prescription regimen. (beta blockers and nitrates)   Cardiac Rehab from 11/21/2014 in [facility] Cardiac and Pulmonary Rehab  Date  10/24/14  Educator  SB  Instruction Review Code (retired)  2- meets goals/outcomes      Cardiac Medications I: - Group verbal and written instruction to review commonly prescribed medications for heart disease. Reviews the medication, class of the drug, and side effects. Includes the steps to properly store meds and maintain the prescription regimen.   Cardiac Rehab from 11/21/2014 in [facility] Cardiac and Pulmonary Rehab  Date  11/07/14  Educator  SB  Instruction Review Code (retired)  2- meets goals/outcomes      Cardiac Medications II: -Group verbal and  written instruction to review commonly prescribed medications for heart disease. Reviews the medication, class of the drug, and side effects. (all other drug classes)    Go Sex-Intimacy & Heart Disease, Get SMART - Goal Setting: - Group verbal and written instruction through game format to discuss heart disease and the return to sexual intimacy. Provides group verbal and written material to discuss and apply goal setting through the application of the S.M.A.R.T. Method.   Cardiac Rehab from 11/21/2014 in [facility] Cardiac and Pulmonary Rehab  Date  10/24/14  Educator  SB  Instruction Review Code (retired)  2- meets goals/outcomes      Other Matters of the Heart: - Provides group verbal, written materials and models to describe Stable Angina and Peripheral Artery. Includes description of the disease process and treatment options available to the cardiac patient.   Cardiac Rehab from 11/21/2014 in [facility] Cardiac and Pulmonary Rehab  Date  10/12/14  Educator  [place]  Instruction Review Code (retired)  2- meets goals/outcomes      Exercise & Equipment Safety: - Individual verbal instruction and demonstration of equipment use and safety with use of the equipment.   Cardiac Rehab from 02/03/2018 in [facility] Cardiac and Pulmonary Rehab  Date  01/29/18  Educator  sb  Instruction Review Code  1- Verbalizes Understanding      Infection Prevention: - Provides verbal and written material to individual with discussion of infection control including proper hand washing and proper equipment cleaning during exercise session.   Cardiac Rehab from 02/03/2018 in [facility] Cardiac and Pulmonary Rehab  Date  01/29/18  Educator  SB1  Instruction Review Code  1- Verbalizes Understanding      Falls Prevention: - Provides verbal and written material to individual with discussion of falls prevention and safety.   Cardiac Rehab from 02/03/2018 in [facility] Cardiac and Pulmonary Rehab  Date  01/29/18  Educator  sb   Instruction Review Code  1- Verbalizes Understanding      Diabetes: - Individual verbal and written instruction to review signs/symptoms of diabetes, desired ranges of glucose level fasting, after meals and with exercise. Acknowledge that pre and post exercise glucose checks will be done for 3 sessions at entry of program.   Know Your Numbers and Risk Factors: -Group verbal and written instruction about important numbers in your health.  Discussion of what are risk factors and how they play a role in the disease process.  Review of Cholesterol, Blood Pressure, Diabetes, and BMI and the role they play in your  overall health.   Sleep Hygiene: -Provides group verbal and written instruction about how sleep can affect your health.  Define sleep hygiene, discuss sleep cycles and impact of sleep habits. Review good sleep hygiene tips.    Other: -Provides group and verbal instruction on various topics (see comments)   Knowledge Questionnaire Score: Knowledge Questionnaire Score - 01/29/18 1446      Knowledge Questionnaire Score   Pre Score  21/26   Reviewed correct responses with [name] today. He verbalized understanding and had no further questions today      Core Components/Risk Factors/Patient Goals at Admission: Personal Goals and Risk Factors at Admission - 01/29/18 1447      Core Components/Risk Factors/Patient Goals on Admission   Tobacco Cessation  Yes    Number of packs per day  Quit 2 weeks ago Jan 2020    Intervention  Assist the participant in steps to quit. Provide individualized education and counseling about committing to Tobacco Cessation, relapse prevention, and pharmacological support that can be provided by physician.;Advice worker, assist with locating and accessing local/national Quit Smoking programs, and support quit date choice.    Expected Outcomes  Short Term: Will demonstrate readiness to quit, by selecting a quit date.;Long Term: Complete  abstinence from all tobacco products for at least 12 months from quit date.;Short Term: Will quit all tobacco product use, adhering to prevention of relapse plan.    Heart Failure  Yes    Intervention  Provide a combined exercise and nutrition program that is supplemented with education, support and counseling about heart failure. Directed toward relieving symptoms such as shortness of breath, decreased exercise tolerance, and extremity edema.    Expected Outcomes  Improve functional capacity of life;Short term: Attendance in program 2-3 days a week with increased exercise capacity. Reported lower sodium intake. Reported increased fruit and vegetable intake. Reports medication compliance.;Short term: Daily weights obtained and reported for increase. Utilizing diuretic protocols set by physician.;Long term: Adoption of self-care skills and reduction of barriers for early signs and symptoms recognition and intervention leading to self-care maintenance.   Spoke to [employer] about daily weights and reporting 2-3 pound increase to MD. [name] has stopped his LAsix "I had to go all the time" Advised Jope to let his MD know he has stopped the LAsix and to check on the potassuim he is prescribed.;   Hypertension  Yes    Intervention  Provide education on lifestyle modifcations including regular physical activity/exercise, weight management, moderate sodium restriction and increased consumption of fresh fruit, vegetables, and low fat dairy, alcohol moderation, and smoking cessation.;Monitor prescription use compliance.    Expected Outcomes  Short Term: Continued assessment and intervention until BP is < 140/43m HG in hypertensive participants. < 130/858mHG in hypertensive participants with diabetes, heart failure or chronic kidney disease.;Long Term: Maintenance of blood pressure at goal levels.    Lipids  Yes    Intervention  Provide education and support for participant on nutrition & aerobic/resistive exercise  along with prescribed medications to achieve LDL <7027mHDL >42m68m  Expected Outcomes  Short Term: Participant states understanding of desired cholesterol values and is compliant with medications prescribed. Participant is following exercise prescription and nutrition guidelines.;Long Term: Cholesterol controlled with medications as prescribed, with individualized exercise RX and with personalized nutrition plan. Value goals: LDL < 70mg25mL > 40 mg.       Core Components/Risk Factors/Patient Goals Review:    Core Components/Risk Factors/Patient Goals at Discharge (Final  Review):    ITP Comments: ITP Comments    Row Name 01/29/18 1424 02/04/18 0935         ITP Comments  Medical review completed . ITP sent to Dr Loleta Chance for review, changes as needed and signature. Documentation of diagnosis can be found in [facility] 01/18/2018  30 Day Review. Continue with ITP unless directed changes per Medical Director review.         Comments: 30 day review

## 2018-02-05 ENCOUNTER — Other Ambulatory Visit (INDEPENDENT_AMBULATORY_CARE_PROVIDER_SITE_OTHER): Payer: Medicare PPO

## 2018-02-05 ENCOUNTER — Encounter: Payer: Medicare PPO | Admitting: *Deleted

## 2018-02-05 ENCOUNTER — Ambulatory Visit (INDEPENDENT_AMBULATORY_CARE_PROVIDER_SITE_OTHER): Payer: Medicare PPO | Admitting: Vascular Surgery

## 2018-02-05 DIAGNOSIS — I214 Non-ST elevation (NSTEMI) myocardial infarction: Secondary | ICD-10-CM | POA: Diagnosis not present

## 2018-02-05 NOTE — Progress Notes (Signed)
Daily Session Note  Patient Details  Name: BRAIDAN RICCIARDI MRN: [id number] Date of Birth: 03/03/1945 Referring Provider:     Cardiac Rehab from 01/29/2018 in [facility] Cardiac and Pulmonary Rehab  Referring Provider  [name]      Encounter Date: 02/05/2018  Check In: Session Check In - 02/05/18 0804      Check-In   Supervising physician immediately available to respond to emergencies  See telemetry face sheet for immediately available ER MD    Location  [facility]-Cardiac & Pulmonary Rehab    Staff Present  Jasper Loser BS, Exercise Physiologist;Carroll Enterkin, RN, BSN    Medication changes reported      No    Fall or balance concerns reported     No    Warm-up and Cool-down  Performed as group-led instruction    Resistance Training Performed  Yes    VAD Patient?  No    PAD/SET Patient?  No      Pain Assessment   Currently in Pain?  No/denies          Social History   Tobacco Use  Smoking Status Former Smoker  . Packs/day: 0.50  . Years: 20.00  . Pack years: 10.00  . Types: Cigarettes  Smokeless Tobacco Never Used  Tobacco Comment   Quit     Goals Met:  Independence with exercise equipment Exercise tolerated well No report of cardiac concerns or symptoms Strength training completed today  Goals Unmet:  Not Applicable  Comments: Pt able to follow exercise prescription today without complaint.  Will continue to monitor for progression.    Dr. Emily Filbert is Medical Director for [place] and [facility].

## 2018-02-10 ENCOUNTER — Encounter: Payer: Medicare PPO | Attending: Cardiology | Admitting: *Deleted

## 2018-02-10 DIAGNOSIS — E785 Hyperlipidemia, unspecified: Secondary | ICD-10-CM | POA: Insufficient documentation

## 2018-02-10 DIAGNOSIS — I110 Hypertensive heart disease with heart failure: Secondary | ICD-10-CM | POA: Diagnosis not present

## 2018-02-10 DIAGNOSIS — I214 Non-ST elevation (NSTEMI) myocardial infarction: Secondary | ICD-10-CM | POA: Diagnosis not present

## 2018-02-10 DIAGNOSIS — Z7982 Long term (current) use of aspirin: Secondary | ICD-10-CM | POA: Diagnosis not present

## 2018-02-10 DIAGNOSIS — G4733 Obstructive sleep apnea (adult) (pediatric): Secondary | ICD-10-CM | POA: Diagnosis not present

## 2018-02-10 DIAGNOSIS — I4891 Unspecified atrial fibrillation: Secondary | ICD-10-CM | POA: Insufficient documentation

## 2018-02-10 DIAGNOSIS — Z79899 Other long term (current) drug therapy: Secondary | ICD-10-CM | POA: Diagnosis not present

## 2018-02-10 DIAGNOSIS — Z87891 Personal history of nicotine dependence: Secondary | ICD-10-CM | POA: Insufficient documentation

## 2018-02-10 DIAGNOSIS — I739 Peripheral vascular disease, unspecified: Secondary | ICD-10-CM | POA: Diagnosis not present

## 2018-02-10 DIAGNOSIS — K219 Gastro-esophageal reflux disease without esophagitis: Secondary | ICD-10-CM | POA: Insufficient documentation

## 2018-02-10 DIAGNOSIS — I5022 Chronic systolic (congestive) heart failure: Secondary | ICD-10-CM | POA: Insufficient documentation

## 2018-02-10 DIAGNOSIS — R7303 Prediabetes: Secondary | ICD-10-CM | POA: Insufficient documentation

## 2018-02-10 DIAGNOSIS — M069 Rheumatoid arthritis, unspecified: Secondary | ICD-10-CM | POA: Diagnosis not present

## 2018-02-10 DIAGNOSIS — I2510 Atherosclerotic heart disease of native coronary artery without angina pectoris: Secondary | ICD-10-CM | POA: Insufficient documentation

## 2018-02-10 NOTE — Progress Notes (Signed)
Daily Session Note  Patient Details  Name: Martin Adkins MRN: [id number] Date of Birth: 1945-01-11 Referring Provider:     Cardiac Rehab from 01/29/2018 in [facility] Cardiac and Pulmonary Rehab  Referring Provider  [name]      Encounter Date: 02/10/2018  Check In: Session Check In - 02/10/18 0755      Check-In   Supervising physician immediately available to respond to emergencies  See telemetry face sheet for immediately available ER MD    Location  [facility]-Cardiac & Pulmonary Rehab    Staff Present  Heath Lark, RN, BSN, CCRP;Jeanna Durrell BS, Exercise Physiologist;Amanda Oletta Darter, BA, ACSM CEP, Exercise Physiologist    Medication changes reported      No    Fall or balance concerns reported     No    Warm-up and Cool-down  Performed as group-led Higher education careers adviser Performed  Yes    VAD Patient?  No    PAD/SET Patient?  No      Pain Assessment   Currently in Pain?  No/denies          Social History   Tobacco Use  Smoking Status Former Smoker  . Packs/day: 0.50  . Years: 20.00  . Pack years: 10.00  . Types: Cigarettes  Smokeless Tobacco Never Used  Tobacco Comment   Quit     Goals Met:  Independence with exercise equipment Exercise tolerated well No report of cardiac concerns or symptoms Strength training completed today  Goals Unmet:  Not Applicable  Comments: Pt able to follow exercise prescription today without complaint.  Will continue to monitor for progression.    Dr. Emily Filbert is Medical Director for [place] and [facility].

## 2018-02-11 ENCOUNTER — Ambulatory Visit (INDEPENDENT_AMBULATORY_CARE_PROVIDER_SITE_OTHER): Payer: Medicare PPO

## 2018-02-11 ENCOUNTER — Ambulatory Visit (INDEPENDENT_AMBULATORY_CARE_PROVIDER_SITE_OTHER): Payer: Medicare PPO | Admitting: Vascular Surgery

## 2018-02-11 ENCOUNTER — Encounter (INDEPENDENT_AMBULATORY_CARE_PROVIDER_SITE_OTHER): Payer: Self-pay | Admitting: Vascular Surgery

## 2018-02-11 VITALS — BP 148/84 | HR 78 | Resp 16 | Ht 71.5 in | Wt 211.8 lb

## 2018-02-11 DIAGNOSIS — Z87891 Personal history of nicotine dependence: Secondary | ICD-10-CM | POA: Diagnosis not present

## 2018-02-11 DIAGNOSIS — I713 Abdominal aortic aneurysm, ruptured, unspecified: Secondary | ICD-10-CM

## 2018-02-11 DIAGNOSIS — I714 Abdominal aortic aneurysm, without rupture, unspecified: Secondary | ICD-10-CM

## 2018-02-11 DIAGNOSIS — I739 Peripheral vascular disease, unspecified: Secondary | ICD-10-CM | POA: Diagnosis not present

## 2018-02-11 NOTE — Progress Notes (Signed)
Subjective:    Patient ID: Martin Adkins, male    DOB: 02-09-1945, 73 y.o.   MRN: [id number] Chief Complaint  Patient presents with  . Follow-up   Patient presents for a 57-month EVAR follow-up.  The patient is status post a revision of a previously performed EVAR for an endoleak on September 24, 2017.  Patient presents today without complaint.  Patient denies any abdominal pain, back pain or thrombosis bilateral extremities.  Patient denies any worsening claudication-like symptoms, rest pain or ulcer formation to the bilateral legs.  The patient underwent a endovascular aortic repair study which was notable for patent endovascular aneurysm repair without evidence of endoleak.  The largest aortic diameter has decreased when compared to the previous exam.  Previous diameter measurement was 5.9 cm obtained on November 04, 2017.  Largest diameter measurement obtained today was 4.9 cm.  Patient denies any fever, nausea vomiting.  Review of Systems  Constitutional: Negative.   HENT: Negative.   Eyes: Negative.   Respiratory: Negative.   Cardiovascular: Negative.   Gastrointestinal: Negative.   Endocrine: Negative.   Genitourinary: Negative.   Musculoskeletal: Negative.   Skin: Negative.   Allergic/Immunologic: Negative.   Neurological: Negative.   Hematological: Negative.   Psychiatric/Behavioral: Negative.       Objective:   Physical Exam Vitals signs reviewed.  Constitutional:      Appearance: Normal appearance. He is normal weight.  HENT:     Head: Normocephalic and atraumatic.     Right Ear: External ear normal.     Left Ear: External ear normal.     Nose: Nose normal.     Mouth/Throat:     Mouth: Mucous membranes are moist.     Pharynx: Oropharynx is clear.  Eyes:     Extraocular Movements: Extraocular movements intact.     Conjunctiva/sclera: Conjunctivae normal.     Pupils: Pupils are equal, round, and reactive to light.  Neck:     Musculoskeletal: Normal range of  motion.  Cardiovascular:     Rate and Rhythm: Normal rate and regular rhythm.  Pulmonary:     Effort: Pulmonary effort is normal.     Breath sounds: Normal breath sounds.  Abdominal:     General: Abdomen is flat. Bowel sounds are normal. There is no distension.     Palpations: Abdomen is soft.     Tenderness: There is no abdominal tenderness. There is no guarding.  Musculoskeletal: Normal range of motion.        General: No swelling.  Skin:    General: Skin is warm and dry.  Neurological:     General: No focal deficit present.     Mental Status: He is alert and oriented to person, place, and time. Mental status is at baseline.  Psychiatric:        Mood and Affect: Mood normal.        Behavior: Behavior normal.        Thought Content: Thought content normal.        Judgment: Judgment normal.    BP (!) 148/84 (BP Location: Right Arm, Patient Position: Sitting, Cuff Size: Normal)   Pulse 78   Resp 16   Ht 5' 11.5" (1.816 m)   Wt 211 lb 12.8 oz (96.1 kg)   BMI 29.13 kg/m   Past Medical History:  Diagnosis Date  . AAA (abdominal aortic aneurysm) without rupture ([place]) 04/05/2014  . AAA (abdominal aortic aneurysm) without rupture ([place]) 04/05/2014  . Abdominal aortic aneurysm  without rupture ([place])   . Acute non-ST elevation myocardial infarction (NSTEMI) ([place]) 08/15/2014  . 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/28/2014  . APS (antiphospholipid syndrome) ([place])   . Arteriosclerosis of coronary artery 04/05/2014   Overview:  Sp cabg with lima to lad svg to d1 om1 rca 2004 with occluded svg to rca and d1 and pci stetn of om1 graft 2015   . Arthritis   . B12 deficiency 03/21/2017  . Barrett's esophagus   . Benign essential HTN 05/02/2014  . BPH (benign prostatic hyperplasia)   . CAD (coronary artery disease)   . CHF (congestive heart failure) ([place])   . Chronic systolic heart failure ([place]) 04/19/2014   Overview:  With segmental LV systolic dysfunction ejection fraction of 35%   . CLL  (chronic lymphocytic leukemia) ([place])   . CLL (chronic lymphocytic leukemia) ([place]) 05/24/2017  . DVT (deep venous thrombosis) ([place])   . Dysrhythmia    ATRIAL FIB.  Marland Kitchen GERD (gastroesophageal reflux disease)   . History of hiatal hernia   . History of shingles 2013  . Hyperlipemia   . Hyperplastic polyps of stomach   . Hypertension   . Myocardial infarction ([place]) 07/30/2014  . NSTEMI (non-ST elevated myocardial infarction) ([place])   . OSA (obstructive sleep apnea)   . Osteoarthritis   . Pre-diabetes   . PVD (peripheral vascular disease) ([place])   . RA (rheumatoid arthritis) ([place])   . SOB (shortness of breath)    Social History   Socioeconomic History  . Marital status: Legally Separated    Spouse name: Not on file  . Number of children: Not on file  . Years of education: Not on file  . Highest education level: Not on file  Occupational History  . Not on file  Social Needs  . Financial resource strain: Not hard at all  . Food insecurity:    Worry: Never true    Inability: Never true  . Transportation needs:    Medical: No    Non-medical: No  Tobacco Use  . Smoking status: Former Smoker    Packs/day: 0.50    Years: 20.00    Pack years: 10.00    Types: Cigarettes  . Smokeless tobacco: Never Used  . Tobacco comment: Quit   Substance and Sexual Activity  . Alcohol use: Yes    Alcohol/week: 0.0 standard drinks    Comment: social on weekends  . Drug use: No  . Sexual activity: Never  Lifestyle  . Physical activity:    Days per week: 7 days    Minutes per session: 40 min  . Stress: Not at all  Relationships  . Social connections:    Talks on phone: More than three times a week    Gets together: More than three times a week    Attends religious service: More than 4 times per year    Active member of club or organization: No    Attends meetings of clubs or organizations: Never    Relationship status: Separated  . Intimate partner violence:    Fear of current or ex  partner: No    Emotionally abused: No    Physically abused: No    Forced sexual activity: No  Other Topics Concern  . Not on file  Social History Narrative  . Not on file   Past Surgical History:  Procedure Laterality Date  . ABDOMINAL AORTA STENT    . CHOLECYSTECTOMY    . COLONOSCOPY  11/24/2009  . CORONARY ANGIOPLASTY WITH  STENT PLACEMENT  2015  . CORONARY ARTERY BYPASS GRAFT    . ENDOVASCULAR REPAIR/STENT GRAFT N/A 09/24/2017   Procedure: ENDOVASCULAR REPAIR/STENT GRAFT;  Surgeon: Algernon Huxley, MD;  Location: [facility];  Service: Cardiovascular;  Laterality: N/A;  . ESOPHAGOGASTRODUODENOSCOPY  05/26/02  03/23/12  . ESOPHAGOGASTRODUODENOSCOPY (EGD) WITH PROPOFOL N/A 10/28/2016   Procedure: ESOPHAGOGASTRODUODENOSCOPY (EGD) WITH PROPOFOL;  Surgeon: Manya Silvas, MD;  Location: [facility] ENDOSCOPY;  Service: Endoscopy;  Laterality: N/A;  . LEFT HEART CATH AND CORONARY ANGIOGRAPHY N/A 01/19/2018   Procedure: LEFT HEART CATH AND CORONARY ANGIOGRAPHY;  Surgeon: Teodoro Spray, MD;  Location: [place] [facility];  Service: Cardiovascular;  Laterality: N/A;  . PERIPHERAL VASCULAR CATHETERIZATION N/A 09/06/2014   Procedure: IVC Filter Removal;  Surgeon: Katha Cabal, MD;  Location: [place] [facility];  Service: Cardiovascular;  Laterality: N/A;  . TRANSURETHRAL RESECTION OF PROSTATE N/A 02/20/2015   Procedure: TRANSURETHRAL RESECTION OF THE PROSTATE (TURP);  Surgeon: Hollice Espy, MD;  Location: [facility] ORS;  Service: Urology;  Laterality: N/A;   Family History  Problem Relation Age of Onset  . Cancer Mother 34       breast ca  . Diabetes Mother   . Coronary artery disease Father   . Heart attack Father   . Prostate cancer Brother   . Bladder Cancer Neg Hx   . Kidney cancer Neg Hx    Allergies  Allergen Reactions  . Ace Inhibitors Other (See Comments)    Other reaction(s): Cough  . Pravastatin Other (See Comments)    Other reaction(s): Muscle Pain  . Rosuvastatin  Other (See Comments)    Other reaction(s): Other (See Comments) GI bleed  . Simvastatin Other (See Comments)    Other reaction(s): Muscle Pain  . Amoxicillin Rash  . Niacin Rash  . Penicillins Rash    Has patient had a PCN reaction causing immediate rash, facial/tongue/throat swelling, SOB or lightheadedness with hypotension: Yes Has patient had a PCN reaction causing severe rash involving mucus membranes or skin necrosis: Unknown Has patient had a PCN reaction that required hospitalization: Unknown Has patient had a PCN reaction occurring within the last 10 years: No If all of the above answers are "NO", then may proceed with Cephalosporin use.      Assessment & Plan:  Patient presents for a 40-month EVAR follow-up.  The patient is status post a revision of a previously performed EVAR for an endoleak on September 24, 2017.  Patient presents today without complaint.  Patient denies any abdominal pain, back pain or thrombosis bilateral extremities.  Patient denies any worsening claudication-like symptoms, rest pain or ulcer formation to the bilateral legs.  The patient underwent a endovascular aortic repair study which was notable for patent endovascular aneurysm repair without evidence of endoleak.  The largest aortic diameter has decreased when compared to the previous exam.  Previous diameter measurement was 5.9 cm obtained on November 04, 2017.  Largest diameter measurement obtained today was 4.9 cm.  Patient denies any fever, nausea vomiting.  1. Leaking abdominal aortic aneurysm (AAA) ([place]) - Stable The patient is status post revision repair on September 24, 2017. No endoleak has been noticed since his repair The patient presents asymptomatically Physical exam is unremarkable No endoleak is noted on duplex The patient should follow-up in 6 months for a surveillance EVAR The patient's blood pressure is being adequately controlled however I have reviewed the importance of hypertension and  lipid control and the importance of continuing  his abstinence from tobacco.  The patient is also encouraged to exercise a minimum of 30 minutes 4 times a week.  Should the patient develop new onset abdominal or back pain or signs of peripheral embolization they are instructed to seek medical attention immediately and to alert the physician providing care that they have an aneurysm.  The patient voices their understanding.  - VAS [place] EVAR DUPLEX; Future  2. Abdominal aortic aneurysm (AAA) without rupture ([place]) - Stable As Above  - VAS [place] EVAR DUPLEX; Future  3. Peripheral vascular disease ([place]) - Stable Patient with a known history of peripheral artery disease Last ABI 08/22/17 Will have the patient undergo a surveillance ABI when he returns in 6 months to follow a yearly follow-up for peripheral artery disease Patient to continue medical optimization with ASA and dyslipidemia medication. Patient to remain abstinent of tobacco use. I have discussed with the patient at length the risk factors for and pathogenesis of atherosclerotic disease and encouraged a healthy diet, regular exercise regimen and blood pressure / glucose control.  The patient was encouraged to call the office in the interim if he experiences any claudication like symptoms, rest pain or ulcers to his feet / toes  - VAS [place] ABI WITH/WO TBI; Future  Current Outpatient Medications on File Prior to Visit  Medication Sig Dispense Refill  . albuterol (PROVENTIL HFA;VENTOLIN HFA) 108 (90 Base) MCG/ACT inhaler Inhale 2 puffs into the lungs every 6 (six) hours as needed for wheezing or shortness of breath. 1 Inhaler 0  . amLODipine (NORVASC) 10 MG tablet Take 10 mg by mouth daily.     Marland Kitchen atorvastatin (LIPITOR) 80 MG tablet Take 80 mg by mouth daily.   11  . Cyanocobalamin 1000 MCG CAPS Take 1,000 mcg by mouth daily. 90 capsule 0  . fluticasone (FLONASE) 50 MCG/ACT nasal spray Place 2 sprays into both nostrils daily.     Marland Kitchen gabapentin  (NEURONTIN) 300 MG capsule Take 1 capsule (300 mg total) by mouth 3 (three) times daily. (Patient taking differently: Take 300 mg by mouth 2 (two) times daily. ) 90 capsule 0  . hydroxychloroquine (PLAQUENIL) 200 MG tablet Take 200 mg by mouth every other day.     . Ipratropium-Albuterol (COMBIVENT) 20-100 MCG/ACT AERS respimat Inhale 1 puff into the lungs every 6 (six) hours. 1 Inhaler 11  . isosorbide mononitrate (IMDUR) 60 MG 24 hr tablet Take 1 tablet (60 mg total) by mouth daily. 30 tablet 0  . latanoprost (XALATAN) 0.005 % ophthalmic solution INSTILL ONE DROP IN [facility] EYE AT BEDTIME    . metoprolol succinate (TOPROL-XL) 100 MG 24 hr tablet Take 100 mg by mouth daily.     . montelukast (SINGULAIR) 10 MG tablet Take 1 tablet (10 mg total) by mouth as needed. Take before Gazyva infusion. 20 tablet 0  . pantoprazole (PROTONIX) 40 MG tablet Take 40 mg by mouth daily.     . potassium chloride SA (K-DUR,KLOR-CON) 20 MEQ tablet Take 20 mEq by mouth daily.     . rivaroxaban (XARELTO) 20 MG TABS tablet Take 1 tablet (20 mg total) by mouth daily with supper. 30 tablet 3   No current facility-administered medications on file prior to visit.    There are no Patient Instructions on file for this visit. No follow-ups on file.  Jeannelle Wiens A Bao Coreas, PA-C

## 2018-02-12 ENCOUNTER — Encounter: Payer: Medicare PPO | Admitting: *Deleted

## 2018-02-12 DIAGNOSIS — I214 Non-ST elevation (NSTEMI) myocardial infarction: Secondary | ICD-10-CM

## 2018-02-12 NOTE — Progress Notes (Signed)
Daily Session Note  Patient Details  Name: Martin Adkins MRN: [id number] Date of Birth: [birth year] Referring Provider:     Cardiac Rehab from 01/29/2018 in [facility] Cardiac and Pulmonary Rehab  Referring Provider  [name]      Encounter Date: 02/12/2018  Check In: Session Check In - 02/12/18 0750      Check-In   Supervising physician immediately available to respond to emergencies  See telemetry face sheet for immediately available ER MD    Location  [facility]-Cardiac & Pulmonary Rehab    Staff Present  Jasper Loser BS, Exercise Physiologist;Carroll Enterkin, RN, Levie Heritage, MA, RCEP, CCRP, Exercise Physiologist    Medication changes reported      No    Fall or balance concerns reported     No    Warm-up and Cool-down  Performed as group-led instruction    Resistance Training Performed  Yes    VAD Patient?  No    PAD/SET Patient?  No      Pain Assessment   Currently in Pain?  No/denies          Social History   Tobacco Use  Smoking Status Former Smoker  . Packs/day: 0.50  . Years: 20.00  . Pack years: 10.00  . Types: Cigarettes  Smokeless Tobacco Never Used  Tobacco Comment   Quit     Goals Met:  Independence with exercise equipment Exercise tolerated well Personal goals reviewed No report of cardiac concerns or symptoms Strength training completed today  Goals Unmet:  Not Applicable  Comments: Pt able to follow exercise prescription today without complaint.  Will continue to monitor for progression. Reviewed home exercise with pt today.  Pt plans to use the [facility] for exercise.  Reviewed THR, pulse, RPE, sign and symptoms, NTG use, and when to call 911 or MD.  Also discussed weather considerations and indoor options.  Pt voiced understanding.    Dr. Emily Filbert is Medical Director for [place] and [facility].

## 2018-02-17 ENCOUNTER — Encounter: Payer: Medicare PPO | Admitting: *Deleted

## 2018-02-17 DIAGNOSIS — I214 Non-ST elevation (NSTEMI) myocardial infarction: Secondary | ICD-10-CM

## 2018-02-17 NOTE — Progress Notes (Signed)
Daily Session Note  Patient Details  Name: Martin Adkins MRN: [id number] Date of Birth: 1945-01-29 Referring Provider:     Cardiac Rehab from 01/29/2018 in [facility] Cardiac and Pulmonary Rehab  Referring Provider  [name]      Encounter Date: 02/17/2018  Check In: Session Check In - 02/17/18 0754      Check-In   Supervising physician immediately available to respond to emergencies  See telemetry face sheet for immediately available ER MD    Location  [facility]-Cardiac & Pulmonary Rehab    Staff Present  Heath Lark, RN, BSN, CCRP;Jeanna Durrell BS, Exercise Physiologist;Marlo Arriola [place], MA, RCEP, CCRP, Exercise Physiologist    Medication changes reported      No    Fall or balance concerns reported     No    Warm-up and Cool-down  Performed as group-led Higher education careers adviser Performed  Yes    VAD Patient?  No    PAD/SET Patient?  No      Pain Assessment   Currently in Pain?  No/denies          Social History   Tobacco Use  Smoking Status Former Smoker  . Packs/day: 0.50  . Years: 20.00  . Pack years: 10.00  . Types: Cigarettes  Smokeless Tobacco Never Used  Tobacco Comment   Quit     Goals Met:  Independence with exercise equipment Exercise tolerated well No report of cardiac concerns or symptoms Strength training completed today  Goals Unmet:  Not Applicable  Comments: Pt able to follow exercise prescription today without complaint.  Will continue to monitor for progression.    Dr. Emily Filbert is Medical Director for [place] and [facility].

## 2018-02-19 ENCOUNTER — Encounter: Payer: Medicare PPO | Admitting: *Deleted

## 2018-02-19 DIAGNOSIS — I214 Non-ST elevation (NSTEMI) myocardial infarction: Secondary | ICD-10-CM

## 2018-02-19 NOTE — Progress Notes (Signed)
Daily Session Note  Patient Details  Name: Martin Adkins MRN: [id number] Date of Birth: 02-15-1945 Referring Provider:     Cardiac Rehab from 01/29/2018 in [facility] Cardiac and Pulmonary Rehab  Referring Provider  [name]      Encounter Date: 02/19/2018  Check In: Session Check In - 02/19/18 0754      Check-In   Supervising physician immediately available to respond to emergencies  See telemetry face sheet for immediately available ER MD    Location  [facility]-Cardiac & Pulmonary Rehab    Staff Present  Jasper Loser BS, Exercise Physiologist;Carroll Enterkin, RN, BSN    Medication changes reported      No    Fall or balance concerns reported     No    Warm-up and Cool-down  Performed on first and last piece of equipment    Resistance Training Performed  Yes    VAD Patient?  No    PAD/SET Patient?  No      Pain Assessment   Currently in Pain?  No/denies          Social History   Tobacco Use  Smoking Status Former Smoker  . Packs/day: 0.50  . Years: 20.00  . Pack years: 10.00  . Types: Cigarettes  Smokeless Tobacco Never Used  Tobacco Comment   Quit     Goals Met:  Independence with exercise equipment Exercise tolerated well Personal goals reviewed No report of cardiac concerns or symptoms Strength training completed today  Goals Unmet:  Not Applicable  Comments: Pt able to follow exercise prescription today without complaint.  Will continue to monitor for progression.    Dr. Emily Filbert is Medical Director for [place] and [facility].

## 2018-02-24 DIAGNOSIS — I214 Non-ST elevation (NSTEMI) myocardial infarction: Secondary | ICD-10-CM | POA: Diagnosis not present

## 2018-02-24 NOTE — Progress Notes (Signed)
Daily Session Note  Patient Details  Name: Martin Adkins MRN: [id number] Date of Birth: 10/02/1945 Referring Provider:     Cardiac Rehab from 01/29/2018 in [facility] Cardiac and Pulmonary Rehab  Referring Provider  [name]      Encounter Date: 02/24/2018  Check In: Session Check In - 02/24/18 0753      Check-In   Supervising physician immediately available to respond to emergencies  See telemetry face sheet for immediately available ER MD    Location  [facility]-Cardiac & Pulmonary Rehab    Staff Present  Jeanna Durrell BS, Exercise Physiologist;Susanne Bice, RN, BSN, CCRP;Amanda Sommer, BA, ACSM CEP, Exercise Physiologist    Medication changes reported      No    Fall or balance concerns reported     No    Warm-up and Cool-down  Performed as group-led instruction    Resistance Training Performed  Yes    VAD Patient?  No    PAD/SET Patient?  No      Pain Assessment   Currently in Pain?  No/denies    Multiple Pain Sites  No          Social History   Tobacco Use  Smoking Status Former Smoker  . Packs/day: 0.50  . Years: 20.00  . Pack years: 10.00  . Types: Cigarettes  Smokeless Tobacco Never Used  Tobacco Comment   Quit     Goals Met:  Independence with exercise equipment Exercise tolerated well No report of cardiac concerns or symptoms Strength training completed today  Goals Unmet:  Not Applicable  Comments: Pt able to follow exercise prescription today without complaint.  Will continue to monitor for progression.    Dr. Mark Miller is Medical Director for [facility] and [facility]. 

## 2018-02-26 ENCOUNTER — Encounter: Payer: Medicare PPO | Admitting: *Deleted

## 2018-02-26 DIAGNOSIS — I214 Non-ST elevation (NSTEMI) myocardial infarction: Secondary | ICD-10-CM

## 2018-02-26 NOTE — Progress Notes (Signed)
Daily Session Note  Patient Details  Name: Martin Adkins MRN: [id number] Date of Birth: 1945/06/18 Referring Provider:     Cardiac Rehab from 01/29/2018 in [facility] Cardiac and Pulmonary Rehab  Referring Provider  [name]      Encounter Date: 02/26/2018  Check In: Session Check In - 02/26/18 0743      Check-In   Supervising physician immediately available to respond to emergencies  See telemetry face sheet for immediately available ER MD    Location  [facility]Cardiac & Pulmonary Rehab    Staff Present  Alberteen Sam, MA, RCEP, CCRP, Exercise Physiologist;Carroll Enterkin, RN, BSN;Jeanna Durrell BS, Exercise Physiologist    Medication changes reported      No    Fall or balance concerns reported     No    Warm-up and Cool-down  Performed as group-led Higher education careers adviser Performed  Yes    VAD Patient?  No    PAD/SET Patient?  No      Pain Assessment   Currently in Pain?  No/denies          Social History   Tobacco Use  Smoking Status Former Smoker  . Packs/day: 0.50  . Years: 20.00  . Pack years: 10.00  . Types: Cigarettes  Smokeless Tobacco Never Used  Tobacco Comment   Quit     Goals Met:  Independence with exercise equipment Exercise tolerated well No report of cardiac concerns or symptoms Strength training completed today  Goals Unmet:  Not Applicable  Comments: Pt able to follow exercise prescription today without complaint.  Will continue to monitor for progression.    Dr. Emily Filbert is Medical Director for [place] and [facility].

## 2018-02-27 ENCOUNTER — Inpatient Hospital Stay (HOSPITAL_BASED_OUTPATIENT_CLINIC_OR_DEPARTMENT_OTHER): Payer: Medicare PPO | Admitting: Oncology

## 2018-02-27 ENCOUNTER — Inpatient Hospital Stay: Payer: Medicare PPO | Attending: Oncology

## 2018-02-27 ENCOUNTER — Ambulatory Visit
Admission: RE | Admit: 2018-02-27 | Discharge: 2018-02-27 | Disposition: A | Discharge status: Home / Self Care | Payer: Medicare PPO | Source: Ambulatory Visit | Attending: Oncology | Admitting: Oncology

## 2018-02-27 ENCOUNTER — Other Ambulatory Visit: Payer: Self-pay

## 2018-02-27 ENCOUNTER — Encounter: Payer: Self-pay | Admitting: Oncology

## 2018-02-27 VITALS — BP 120/81 | HR 87 | Temp 98.2°F | Resp 18 | Wt 214.2 lb

## 2018-02-27 DIAGNOSIS — G4733 Obstructive sleep apnea (adult) (pediatric): Secondary | ICD-10-CM | POA: Diagnosis not present

## 2018-02-27 DIAGNOSIS — D6861 Antiphospholipid syndrome: Secondary | ICD-10-CM | POA: Diagnosis not present

## 2018-02-27 DIAGNOSIS — Z86718 Personal history of other venous thrombosis and embolism: Secondary | ICD-10-CM | POA: Insufficient documentation

## 2018-02-27 DIAGNOSIS — I2510 Atherosclerotic heart disease of native coronary artery without angina pectoris: Secondary | ICD-10-CM

## 2018-02-27 DIAGNOSIS — D696 Thrombocytopenia, unspecified: Secondary | ICD-10-CM

## 2018-02-27 DIAGNOSIS — E538 Deficiency of other specified B group vitamins: Secondary | ICD-10-CM | POA: Diagnosis not present

## 2018-02-27 DIAGNOSIS — K760 Fatty (change of) liver, not elsewhere classified: Secondary | ICD-10-CM | POA: Insufficient documentation

## 2018-02-27 DIAGNOSIS — C9110 Chronic lymphocytic leukemia of B-cell type not having achieved remission: Secondary | ICD-10-CM | POA: Diagnosis present

## 2018-02-27 DIAGNOSIS — M069 Rheumatoid arthritis, unspecified: Secondary | ICD-10-CM

## 2018-02-27 DIAGNOSIS — I110 Hypertensive heart disease with heart failure: Secondary | ICD-10-CM

## 2018-02-27 DIAGNOSIS — K449 Diaphragmatic hernia without obstruction or gangrene: Secondary | ICD-10-CM

## 2018-02-27 DIAGNOSIS — E785 Hyperlipidemia, unspecified: Secondary | ICD-10-CM | POA: Diagnosis not present

## 2018-02-27 DIAGNOSIS — Z7982 Long term (current) use of aspirin: Secondary | ICD-10-CM | POA: Diagnosis not present

## 2018-02-27 DIAGNOSIS — I714 Abdominal aortic aneurysm, without rupture: Secondary | ICD-10-CM | POA: Diagnosis not present

## 2018-02-27 DIAGNOSIS — I739 Peripheral vascular disease, unspecified: Secondary | ICD-10-CM | POA: Diagnosis not present

## 2018-02-27 DIAGNOSIS — I4891 Unspecified atrial fibrillation: Secondary | ICD-10-CM | POA: Diagnosis not present

## 2018-02-27 DIAGNOSIS — J209 Acute bronchitis, unspecified: Secondary | ICD-10-CM | POA: Insufficient documentation

## 2018-02-27 DIAGNOSIS — Z87891 Personal history of nicotine dependence: Secondary | ICD-10-CM

## 2018-02-27 DIAGNOSIS — R05 Cough: Secondary | ICD-10-CM

## 2018-02-27 DIAGNOSIS — R059 Cough, unspecified: Secondary | ICD-10-CM

## 2018-02-27 DIAGNOSIS — Z79899 Other long term (current) drug therapy: Secondary | ICD-10-CM | POA: Insufficient documentation

## 2018-02-27 DIAGNOSIS — K219 Gastro-esophageal reflux disease without esophagitis: Secondary | ICD-10-CM | POA: Diagnosis not present

## 2018-02-27 DIAGNOSIS — N400 Enlarged prostate without lower urinary tract symptoms: Secondary | ICD-10-CM

## 2018-02-27 DIAGNOSIS — R161 Splenomegaly, not elsewhere classified: Secondary | ICD-10-CM

## 2018-02-27 DIAGNOSIS — I252 Old myocardial infarction: Secondary | ICD-10-CM | POA: Insufficient documentation

## 2018-02-27 DIAGNOSIS — Z7901 Long term (current) use of anticoagulants: Secondary | ICD-10-CM | POA: Diagnosis not present

## 2018-02-27 DIAGNOSIS — D3501 Benign neoplasm of right adrenal gland: Secondary | ICD-10-CM | POA: Insufficient documentation

## 2018-02-27 LAB — CBC WITH DIFFERENTIAL/PLATELET
ABS IMMATURE GRANULOCYTES: 0.01 10*3/uL (ref 0.00–0.07)
Basophils Absolute: 0 10*3/uL (ref 0.0–0.1)
Basophils Relative: 0 %
Eosinophils Absolute: 0.1 10*3/uL (ref 0.0–0.5)
Eosinophils Relative: 1 %
HCT: 41.1 % (ref 39.0–52.0)
HEMOGLOBIN: 13.6 g/dL (ref 13.0–17.0)
Immature Granulocytes: 0 %
Lymphocytes Relative: 15 %
Lymphs Abs: 0.8 10*3/uL (ref 0.7–4.0)
MCH: 31.3 pg (ref 26.0–34.0)
MCHC: 33.1 g/dL (ref 30.0–36.0)
MCV: 94.5 fL (ref 80.0–100.0)
MONO ABS: 0.5 10*3/uL (ref 0.1–1.0)
Monocytes Relative: 10 %
Neutro Abs: 4 10*3/uL (ref 1.7–7.7)
Neutrophils Relative %: 74 %
Platelets: 87 10*3/uL — ABNORMAL LOW (ref 150–400)
RBC: 4.35 MIL/uL (ref 4.22–5.81)
RDW: 13 % (ref 11.5–15.5)
WBC: 5.5 10*3/uL (ref 4.0–10.5)
nRBC: 0 % (ref 0.0–0.2)

## 2018-02-27 LAB — COMPREHENSIVE METABOLIC PANEL
ALK PHOS: 116 U/L (ref 38–126)
ALT: 21 U/L (ref 0–44)
ANION GAP: 10 (ref 5–15)
AST: 26 U/L (ref 15–41)
Albumin: 4.2 g/dL (ref 3.5–5.0)
BUN: 15 mg/dL (ref 8–23)
CALCIUM: 8.5 mg/dL — AB (ref 8.9–10.3)
CO2: 25 mmol/L (ref 22–32)
Chloride: 106 mmol/L (ref 98–111)
Creatinine, Ser: 0.89 mg/dL (ref 0.61–1.24)
GFR calc Af Amer: >60 mL/min (ref >60)
GFR calc non Af Amer: >60 mL/min (ref >60)
Glucose, Bld: 135 mg/dL — ABNORMAL HIGH (ref 70–99)
Potassium: 3.5 mmol/L (ref 3.5–5.1)
Sodium: 141 mmol/L (ref 135–145)
Total Bilirubin: 1.6 mg/dL — ABNORMAL HIGH (ref 0.3–1.2)
Total Protein: 7.1 g/dL (ref 6.5–8.1)

## 2018-02-27 LAB — INFLUENZA PANEL BY PCR (TYPE A & B)
Influenza A By PCR: NEGATIVE
Influenza B By PCR: NEGATIVE

## 2018-02-27 LAB — VITAMIN B12: Vitamin B-12: 909 pg/mL (ref 180–914)

## 2018-02-27 MED ORDER — GUAIFENESIN 200 MG PO TABS: 200.0000 mg | ORAL_TABLET | ORAL | 0 refills | Status: DC | PRN

## 2018-02-27 MED ORDER — AZITHROMYCIN 250 MG PO TABS: ORAL_TABLET | ORAL | 0 refills | Status: DC

## 2018-02-27 MED ORDER — PREDNISONE 10 MG (21) PO TBPK: ORAL_TABLET | ORAL | 0 refills | Status: DC

## 2018-02-27 NOTE — Progress Notes (Signed)
Patient here for follow up. Presents today with cough that has been going on for about 2-3 days. "cannot get anything to come up with cough." Had a temp of 100F at 1200 today and took tylenol. Has headache and complains of being very hot and sweaty.

## 2018-02-27 NOTE — Progress Notes (Signed)
Hematology/Oncology Follow up note [facility] Telephone:([phone]) [phone] [phone]   Patient Care Team: [name], MD as PCP - General (Internal Medicine) Earlie Server, MD as Medical Oncologist (Medical Oncology)  REFERRING PROVIDER: [name], MD  REASON FOR VISIT Follow up for management of CLL HISTORY OF PRESENTING ILLNESS:  Martin Adkins is a  73 y.o.  male with PMH listed below who was referred to me for evaluation of lymphocytosis.  Recent lab work on 03/05/2017 showed wbc 11.4, hb 14.6, platelet count 117,000, lymphocytosis 63.5%, neutrophil 22%, Report chronic fatigue, no weight loss, fever or chills.  He reports feeling chest "rattleness". Has for CT chest which showed acute finding, chronic finding includes  postsurgical changes consistent with coronary bypass grafting. One of the bypass grafts arising from the aorta shows apparent thrombosis and an area of distal aneurysmal dilatation which is also Thrombosed. Right adrenal adenoma stable in appearance.Mild scarring in the lung bases.Fatty liver.   Takes Aspirin 80m, coumadine, plavix. He is on anticoagulation for previous history of clots.  Denies any bleeding events. Use to drink hard liquir daily, quitted for 4-5 years. Current drinks wine on weekend.    Image Studies # 04/04/2017  [place] showed 1.  Splenomegaly.  No focal splenic lesions evident.2. Increased liver echogenicity, a finding most likely indicative of hepatic steatosis. While no focal liver lesions are evident on this study, it must be cautioned that the sensitivity of ultrasound for detection of focal liver lesions is diminished in this circumstance.3. Pancreas obscured by gas. Portions of inferior vena cava obscured by gas.4.  Gallbladder absent.5. Status post abdominal aortic aneurysm repair. No periaortic fluid.  # 04/21/2017 PET scan 1. Moderate splenomegaly with uniform splenic hypermetabolism,compatible  with the provided history of lymphoproliferative disease.No additional hypermetabolic sites of lymphoproliferative disease. Specifically, no hypermetabolic lymphadenopathy Hepatic steatosis, aortic atherosclerosis. Aneurysm.    # CLL, stage IV disease given spelencemagly, thrombocytopenia, symptomatic with fatigue and weight loss.  CLL IPL score 4, high risk.   # status post abdominal aortic aneurysm repair on 09/25/2017 s/p cardiac cath which revealed patent LIMA to the LAD, no significant disease in the RCA with a 90% stenosis in a small RV marginal branch.  Occluded left circumflex, occluded LAD.  Saphenous vein grafts are all occluded.  RCA does not require grafting.  LAD is well grafted by the left internal mammary.  Medical management  INTERVAL HISTORY Martin JKEYMANI MCLEANis a 73y.o. male who has above presents for assessing toxicities from Venetoclax for treatment for CLL.  He has been off venetoclax since 11/11/2017. He reports feeling tired today. Has developed cough for 2-3 days, associated with, wheezing, and chest congestion. Low grade fever 100 and he took tylenol.   .  Denies hematochezia, hematuria, hematemesis, epistaxis, black tarry stool or easy bruising.    Review of Systems  Constitutional: Positive for fatigue. Negative for appetite change, chills, fever and unexpected weight change.  HENT:   Negative for hearing loss and voice change.        Congestion  Eyes: Negative for eye problems and icterus.  Respiratory: Positive for cough and wheezing. Negative for chest tightness and shortness of breath.   Cardiovascular: Negative for chest pain and leg swelling.  Gastrointestinal: Negative for abdominal distention and abdominal pain.  Endocrine: Negative for hot flashes.  Genitourinary: Negative for difficulty urinating, dysuria and frequency.   Musculoskeletal: Negative for arthralgias.  Skin: Negative for itching and rash.  Neurological:  Negative for light-headedness and  numbness.  Hematological: Negative for adenopathy. Bruises/bleeds easily.  Psychiatric/Behavioral: Negative for confusion.     MEDICAL HISTORY:  Past Medical History:  Diagnosis Date  . AAA (abdominal aortic aneurysm) without rupture ([place]) 04/05/2014  . AAA (abdominal aortic aneurysm) without rupture ([place]) 04/05/2014  . Abdominal aortic aneurysm without rupture ([place])   . Acute non-ST elevation myocardial infarction (NSTEMI) ([place]) 08/15/2014  . 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/28/2014  . APS (antiphospholipid syndrome) ([place])   . Arteriosclerosis of coronary artery 04/05/2014   Overview:  Sp cabg with lima to lad svg to d1 om1 rca 2004 with occluded svg to rca and d1 and pci stetn of om1 graft 2015   . Arthritis   . B12 deficiency 03/21/2017  . Barrett's esophagus   . Benign essential HTN 05/02/2014  . BPH (benign prostatic hyperplasia)   . CAD (coronary artery disease)   . CHF (congestive heart failure) ([place])   . Chronic systolic heart failure ([place]) 04/19/2014   Overview:  With segmental LV systolic dysfunction ejection fraction of 35%   . CLL (chronic lymphocytic leukemia) ([place])   . CLL (chronic lymphocytic leukemia) ([place]) 05/24/2017  . DVT (deep venous thrombosis) ([place])   . Dysrhythmia    ATRIAL FIB.  Marland Kitchen GERD (gastroesophageal reflux disease)   . History of hiatal hernia   . History of shingles 2013  . Hyperlipemia   . Hyperplastic polyps of stomach   . Hypertension   . Myocardial infarction ([place]) 07/30/2014  . NSTEMI (non-ST elevated myocardial infarction) ([place])   . OSA (obstructive sleep apnea)   . Osteoarthritis   . Pre-diabetes   . PVD (peripheral vascular disease) ([place])   . RA (rheumatoid arthritis) ([place])   . SOB (shortness of breath)     SURGICAL HISTORY: Past Surgical History:  Procedure Laterality Date  . ABDOMINAL AORTA STENT    . CHOLECYSTECTOMY    . COLONOSCOPY  11/24/2009  . CORONARY ANGIOPLASTY WITH STENT PLACEMENT  2015  . CORONARY ARTERY BYPASS GRAFT    .  ENDOVASCULAR REPAIR/STENT GRAFT N/A 09/24/2017   Procedure: ENDOVASCULAR REPAIR/STENT GRAFT;  Surgeon: Algernon Huxley, MD;  Location: [place] CV LAB;  Service: Cardiovascular;  Laterality: N/A;  . ESOPHAGOGASTRODUODENOSCOPY  05/26/02  03/23/12  . ESOPHAGOGASTRODUODENOSCOPY (EGD) WITH PROPOFOL N/A 10/28/2016   Procedure: ESOPHAGOGASTRODUODENOSCOPY (EGD) WITH PROPOFOL;  Surgeon: Manya Silvas, MD;  Location: [facility] ENDOSCOPY;  Service: Endoscopy;  Laterality: N/A;  . LEFT HEART CATH AND CORONARY ANGIOGRAPHY N/A 01/19/2018   Procedure: LEFT HEART CATH AND CORONARY ANGIOGRAPHY;  Surgeon: Teodoro Spray, MD;  Location: [place] CV LAB;  Service: Cardiovascular;  Laterality: N/A;  . PERIPHERAL VASCULAR CATHETERIZATION N/A 09/06/2014   Procedure: IVC Filter Removal;  Surgeon: Katha Cabal, MD;  Location: [place] CV LAB;  Service: Cardiovascular;  Laterality: N/A;  . TRANSURETHRAL RESECTION OF PROSTATE N/A 02/20/2015   Procedure: TRANSURETHRAL RESECTION OF THE PROSTATE (TURP);  Surgeon: Hollice Espy, MD;  Location: [facility] ORS;  Service: Urology;  Laterality: N/A;    SOCIAL HISTORY: Social History   Socioeconomic History  . Marital status: Legally Separated    Spouse name: Not on file  . Number of children: Not on file  . Years of education: Not on file  . Highest education level: Not on file  Occupational History  . Not on file  Social Needs  . Financial resource strain: Not hard at all  . Food insecurity:    Worry: Never true  Inability: Never true  . Transportation needs:    Medical: No    Non-medical: No  Tobacco Use  . Smoking status: Former Smoker    Packs/day: 0.50    Years: 20.00    Pack years: 10.00    Types: Cigarettes  . Smokeless tobacco: Never Used  . Tobacco comment: Quit   Substance and Sexual Activity  . Alcohol use: Yes    Alcohol/week: 0.0 standard drinks    Comment: social on weekends  . Drug use: No  . Sexual activity: Never  Lifestyle  .  Physical activity:    Days per week: 7 days    Minutes per session: 40 min  . Stress: Not at all  Relationships  . Social connections:    Talks on phone: More than three times a week    Gets together: More than three times a week    Attends religious service: More than 4 times per year    Active member of club or organization: No    Attends meetings of clubs or organizations: Never    Relationship status: Separated  . Intimate partner violence:    Fear of current or ex partner: No    Emotionally abused: No    Physically abused: No    Forced sexual activity: No  Other Topics Concern  . Not on file  Social History Narrative  . Not on file    FAMILY HISTORY: Family History  Problem Relation Age of Onset  . Cancer Mother 51       breast ca  . Diabetes Mother   . Coronary artery disease Father   . Heart attack Father   . Prostate cancer Brother   . Bladder Cancer Neg Hx   . Kidney cancer Neg Hx     ALLERGIES:  is allergic to ace inhibitors; pravastatin; rosuvastatin; simvastatin; amoxicillin; niacin; and penicillins.  MEDICATIONS:  Current Outpatient Medications  Medication Sig Dispense Refill  . amLODipine (NORVASC) 10 MG tablet Take 10 mg by mouth daily.     Marland Kitchen atorvastatin (LIPITOR) 80 MG tablet Take 80 mg by mouth daily.   11  . Cyanocobalamin 1000 MCG CAPS Take 1,000 mcg by mouth daily. 90 capsule 0  . dorzolamide (TRUSOPT) 2 % ophthalmic solution INSTILL ONE DROP IN [facility] EYE TWICE DAILY    . fluticasone (FLONASE) 50 MCG/ACT nasal spray Place 2 sprays into both nostrils daily.     Marland Kitchen gabapentin (NEURONTIN) 300 MG capsule Take 1 capsule (300 mg total) by mouth 3 (three) times daily. (Patient taking differently: Take 300 mg by mouth 2 (two) times daily. ) 90 capsule 0  . hydroxychloroquine (PLAQUENIL) 200 MG tablet Take 200 mg by mouth every other day.     . Ipratropium-Albuterol (COMBIVENT) 20-100 MCG/ACT AERS respimat Inhale 1 puff into the lungs every 6 (six) hours. 1  Inhaler 11  . isosorbide mononitrate (IMDUR) 60 MG 24 hr tablet Take 1 tablet (60 mg total) by mouth daily. 30 tablet 0  . latanoprost (XALATAN) 0.005 % ophthalmic solution INSTILL ONE DROP IN [facility] EYE AT BEDTIME    . metoprolol succinate (TOPROL-XL) 100 MG 24 hr tablet Take 100 mg by mouth daily.     . montelukast (SINGULAIR) 10 MG tablet Take 1 tablet (10 mg total) by mouth as needed. Take before Gazyva infusion. 20 tablet 0  . pantoprazole (PROTONIX) 40 MG tablet Take 40 mg by mouth daily.     . potassium chloride SA (K-DUR,KLOR-CON) 20 MEQ tablet Take  20 mEq by mouth daily.     . rivaroxaban (XARELTO) 20 MG TABS tablet Take 1 tablet (20 mg total) by mouth daily with supper. 30 tablet 3  . albuterol (PROVENTIL HFA;VENTOLIN HFA) 108 (90 Base) MCG/ACT inhaler Inhale 2 puffs into the lungs every 6 (six) hours as needed for wheezing or shortness of breath. (Patient not taking: Reported on 02/27/2018) 1 Inhaler 0  . azithromycin (ZITHROMAX) 250 MG tablet Take 2 tabs on day 1 and take 1 tab every day until finish. 6 each 0  . guaiFENesin 200 MG tablet Take 1 tablet (200 mg total) by mouth every 4 (four) hours as needed for cough or to loosen phlegm. 30 suppository 0  . predniSONE (STERAPRED UNI-PAK 21 TAB) 10 MG (21) TBPK tablet Day 1 take 6 tabs, Day 2 take 5 tabs, Day 3 take 4 tabs, Day 4 take 3 tabs, Day 5 take 2 tabs, Day 6 take 1 tab 21 tablet 0   No current facility-administered medications for this visit.      PHYSICAL EXAMINATION: ECOG 1 Vitals:   02/27/18 1349  BP: 120/81  Pulse: 87  Resp: 18  Temp: 98.2 F (36.8 C)  SpO2: 96%  Physical Exam  Constitutional: He is oriented to person, place, and time. No distress.  HENT:  Head: Normocephalic and atraumatic.  Nose: Nose normal.  Mouth/Throat: Oropharynx is clear and moist. No oropharyngeal exudate.  Eyes: Pupils are equal, round, and reactive to light. EOM are normal. Left eye exhibits no discharge. No scleral icterus.  Neck:  Normal range of motion. Neck supple. No JVD present.  Cardiovascular: Normal rate, regular rhythm and normal heart sounds.  No murmur heard. Pulmonary/Chest: Effort normal. No respiratory distress. He has wheezes. He has no rales. He exhibits no tenderness.  Abdominal: Soft. He exhibits no distension. There is no abdominal tenderness.  Musculoskeletal: Normal range of motion.        General: No deformity or edema.  Lymphadenopathy:    He has no cervical adenopathy.  Neurological: He is alert and oriented to person, place, and time. No cranial nerve deficit. He exhibits normal muscle tone. Coordination normal.  Skin: Skin is warm and dry. He is not diaphoretic. No erythema.  Psychiatric: Affect normal.  Nursing note and vitals reviewed.    LABORATORY DATA:  I have reviewed the data as listed Lab Results  Component Value Date   WBC 5.5 02/27/2018   HGB 13.6 02/27/2018   HCT 41.1 02/27/2018   MCV 94.5 02/27/2018   PLT 87 (L) 02/27/2018   Recent Labs    04/09/17 1155  01/15/18 0828 01/18/18 1308 01/30/18 1330 02/27/18 1330  NA  --    < > 140 137 139 141  K  --    < > 3.2* 3.5 3.8 3.5  CL  --    < > 103 103 108 106  CO2  --    < > '26 24 24 25  ' GLUCOSE  --    < > 105* 153* 107* 135*  BUN  --    < > '14 20 17 15  ' CREATININE  --    < > 0.86 0.93 0.78 0.89  CALCIUM  --    < > 9.1 9.1 8.9 8.5*  GFRNONAA  --    < > >60 >60 >60 >60  GFRAA  --    < > >60 >60 >60 >60  PROT  --    < > 7.6  --  7.2  7.1  ALBUMIN  --    < > 4.5  --  4.2 4.2  AST  --    < > 39  --  32 26  ALT  --    < > 35  --  30 21  ALKPHOS  --    < > 111  --  101 116  BILITOT 1.7*   < > 1.5*  --  1.4* 1.6*  BILIDIR 0.3  --   --   --   --   --    < > = values in this interval not displayed.   Hepatitis panel negative.  LDH 228, mildly elevated Beta 2 microglobulin normal.   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 The morphologic and immunophenotypic features  are most consistent with chronic lymphocytic leukemia/small lymphocytic lymphoma. Bone marrow Cytogenetics : normal.  Peripheral blood FISH panel negative for CCND1- IGH mutation, ATM, 12, 13q, Tp53 mutation.   RADIOGRAPHIC STUDIES: I have personally reviewed the radiological images as listed and agreed with the findings in the report. CT angio abd/pelvis showed Type 1b endoleak, AAA aneurysm, Spleen has decreased in size since PET scan in April 2019.   ASSESSMENT & PLAN:  73 yo male presents for immunotherapy treatment of Stage IV CLL. 1. Cough   2. Thrombocytopenia ([place])   3. CLL (chronic lymphocytic leukemia) ([place])   4. Long term current use of anticoagulant therapy   5. Acute bronchitis, unspecified organism    # Acute bronchitis/URI , check influenza.  Recommend 5 days of Azithromycin, prednisone dose pack and guaifenesin  Check CXR. CXR was independently reviewed. No acute process.   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  Platelet counts 87,000. For the CLL treatment, continue hold venetoclax. Likely reduced due to acute infection.   # Chronic anticoagulation on Xarelto. Will monitor platelet count closed. If platelt drops below 50, will hold.  . # History of vitamin B12 deficiency. Recommend patient to take vitamin B12 1075mg daily.  B12 level reviewed. Normal.   Repeat labs in 1 week and follow up in 2 weeks for labs, MD assessments.    ZEarlie Server MD, PhD Hematology Oncology CMay[facility] Hospital [facility] St [facility]'S Campusat AMs State HospitalPager- [phone]/21/2020

## 2018-03-04 ENCOUNTER — Encounter: Payer: Self-pay | Admitting: *Deleted

## 2018-03-04 DIAGNOSIS — I214 Non-ST elevation (NSTEMI) myocardial infarction: Secondary | ICD-10-CM

## 2018-03-04 NOTE — Progress Notes (Signed)
Cardiac Individual Treatment Plan  Patient Details  Name: Martin Adkins MRN: [id number] Date of Birth: [birth year] Referring Provider:     Cardiac Rehab from 01/29/2018 in [facility] Cardiac and Pulmonary Rehab  Referring Provider  [name]      Initial Encounter Date:    Cardiac Rehab from 01/29/2018 in [facility] Cardiac and Pulmonary Rehab  Date  01/29/18      Visit Diagnosis: NSTEMI (non-ST elevated myocardial infarction) [facility])  Patient's Home Medications on Admission:  Current Outpatient Medications:  .  albuterol (PROVENTIL HFA;VENTOLIN HFA) 108 (90 Base) MCG/ACT inhaler, Inhale 2 puffs into the lungs every 6 (six) hours as needed for wheezing or shortness of breath. (Patient not taking: Reported on 02/27/2018), Disp: 1 Inhaler, Rfl: 0 .  amLODipine (NORVASC) 10 MG tablet, Take 10 mg by mouth daily. , Disp: , Rfl:  .  atorvastatin (LIPITOR) 80 MG tablet, Take 80 mg by mouth daily. , Disp: , Rfl: 11 .  azithromycin (ZITHROMAX) 250 MG tablet, Take 2 tabs on day 1 and take 1 tab every day until finish., Disp: 6 each, Rfl: 0 .  Cyanocobalamin 1000 MCG CAPS, Take 1,000 mcg by mouth daily., Disp: 90 capsule, Rfl: 0 .  dorzolamide (TRUSOPT) 2 % ophthalmic solution, INSTILL ONE DROP IN [facility] EYE TWICE DAILY, Disp: , Rfl:  .  fluticasone (FLONASE) 50 MCG/ACT nasal spray, Place 2 sprays into both nostrils daily. , Disp: , Rfl:  .  gabapentin (NEURONTIN) 300 MG capsule, Take 1 capsule (300 mg total) by mouth 3 (three) times daily. (Patient taking differently: Take 300 mg by mouth 2 (two) times daily. ), Disp: 90 capsule, Rfl: 0 .  guaiFENesin 200 MG tablet, Take 1 tablet (200 mg total) by mouth every 4 (four) hours as needed for cough or to loosen phlegm., Disp: 30 suppository, Rfl: 0 .  hydroxychloroquine (PLAQUENIL) 200 MG tablet, Take 200 mg by mouth every other day. , Disp: , Rfl:  .  Ipratropium-Albuterol (COMBIVENT) 20-100 MCG/ACT AERS respimat, Inhale 1 puff into the lungs every 6 (six) hours.,  Disp: 1 Inhaler, Rfl: 11 .  isosorbide mononitrate (IMDUR) 60 MG 24 hr tablet, Take 1 tablet (60 mg total) by mouth daily., Disp: 30 tablet, Rfl: 0 .  latanoprost (XALATAN) 0.005 % ophthalmic solution, INSTILL ONE DROP IN [facility] EYE AT BEDTIME, Disp: , Rfl:  .  metoprolol succinate (TOPROL-XL) 100 MG 24 hr tablet, Take 100 mg by mouth daily. , Disp: , Rfl:  .  montelukast (SINGULAIR) 10 MG tablet, Take 1 tablet (10 mg total) by mouth as needed. Take before Gazyva infusion., Disp: 20 tablet, Rfl: 0 .  pantoprazole (PROTONIX) 40 MG tablet, Take 40 mg by mouth daily. , Disp: , Rfl:  .  potassium chloride SA (K-DUR,KLOR-CON) 20 MEQ tablet, Take 20 mEq by mouth daily. , Disp: , Rfl:  .  predniSONE (STERAPRED UNI-PAK 21 TAB) 10 MG (21) TBPK tablet, Day 1 take 6 tabs, Day 2 take 5 tabs, Day 3 take 4 tabs, Day 4 take 3 tabs, Day 5 take 2 tabs, Day 6 take 1 tab, Disp: 21 tablet, Rfl: 0 .  rivaroxaban (XARELTO) 20 MG TABS tablet, Take 1 tablet (20 mg total) by mouth daily with supper., Disp: 30 tablet, Rfl: 3  Past Medical History: Past Medical History:  Diagnosis Date  . AAA (abdominal aortic aneurysm) without rupture ([place]) 04/05/2014  . AAA (abdominal aortic aneurysm) without rupture ([place]) 04/05/2014  . Abdominal aortic aneurysm without rupture ([place])   .  Acute non-ST elevation myocardial infarction (NSTEMI) ([place]) 08/15/2014  . 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/28/2014  . APS (antiphospholipid syndrome) ([place])   . Arteriosclerosis of coronary artery 04/05/2014   Overview:  Sp cabg with lima to lad svg to d1 om1 rca 2004 with occluded svg to rca and d1 and pci stetn of om1 graft 2015   . Arthritis   . B12 deficiency 03/21/2017  . Barrett's esophagus   . Benign essential HTN 05/02/2014  . BPH (benign prostatic hyperplasia)   . CAD (coronary artery disease)   . CHF (congestive heart failure) ([place])   . Chronic systolic heart failure ([place]) 04/19/2014   Overview:  With segmental LV systolic dysfunction ejection  fraction of 35%   . CLL (chronic lymphocytic leukemia) ([place])   . CLL (chronic lymphocytic leukemia) ([place]) 05/24/2017  . DVT (deep venous thrombosis) ([place])   . Dysrhythmia    ATRIAL FIB.  Marland Kitchen GERD (gastroesophageal reflux disease)   . History of hiatal hernia   . History of shingles 2013  . Hyperlipemia   . Hyperplastic polyps of stomach   . Hypertension   . Myocardial infarction ([place]) 07/30/2014  . NSTEMI (non-ST elevated myocardial infarction) ([place])   . OSA (obstructive sleep apnea)   . Osteoarthritis   . Pre-diabetes   . PVD (peripheral vascular disease) ([place])   . RA (rheumatoid arthritis) ([place])   . SOB (shortness of breath)     Tobacco Use: Social History   Tobacco Use  Smoking Status Former Smoker  . Packs/day: 0.50  . Years: 20.00  . Pack years: 10.00  . Types: Cigarettes  Smokeless Tobacco Never Used  Tobacco Comment   Quit     Labs: Recent Review Flowsheet Data    Labs for ITP Cardiac and Pulmonary Rehab Latest Ref Rng & Units 05/03/2013 01/19/2018   Cholestrol 0 - 200 mg/dL 196 116   LDLCALC 0 - 99 mg/dL 121(H) 49   HDL >40 mg/dL 42 51   Trlycerides <150 mg/dL 164 81       Exercise Target Goals: Exercise Program Goal: Individual exercise prescription set using results from initial 6 min walk test and THRR while considering  patient's activity barriers and safety.   Exercise Prescription Goal: Initial exercise prescription builds to 30-45 minutes a day of aerobic activity, 2-3 days per week.  Home exercise guidelines will be given to patient during program as part of exercise prescription that the participant will acknowledge.  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 See MD 1/24 to see next step in care.  SOB with exertion,  fatigued. Was receiving Chiroparactic Care until stopped by Cancer MD for low platelet count   Cardiac Risk Stratification  High       6 Minute Walk: 6 Minute Walk    Row Name 01/29/18 1438         6 Minute Walk   Phase  Initial     Distance  1235 feet     Walk Time  6 minutes     # of Rest Breaks  0     MPH  2.34     METS  2.8     RPE  11     Perceived Dyspnea   0     VO2 Peak  9.78  Symptoms  Yes (comment)     Comments  L hip pain due to arthritis 2/10     Resting HR  79 bpm     Resting BP  126/70     Exercise Oxygen Saturation  during 6 min walk  97 %     Max Ex. HR  109 bpm     Max Ex. BP  120/68     2 Minute Post BP  122/70        Oxygen Initial Assessment:   Oxygen Re-Evaluation:   Oxygen Discharge (Final Oxygen Re-Evaluation):   Initial Exercise Prescription: Initial Exercise Prescription - 01/29/18 1400      Date of Initial Exercise RX and Referring Provider   Date  01/29/18    Referring Provider  [name]      Treadmill   MPH  2    Grade  1    Minutes  15    METs  2.8      Recumbant Bike   Level  2    RPM  60    Watts  24    Minutes  15      NuStep   Level  3    SPM  80    Minutes  15    METs  2.8      Prescription Details   Frequency (times per week)  3    Duration  Progress to 30 minutes of continuous aerobic without signs/symptoms of physical distress      Intensity   THRR 40-80% of Max Heartrate  107-136    Ratings of Perceived Exertion  11-15    Perceived Dyspnea  0-4      Resistance Training   Training Prescription  Yes    Weight  3 lb    Reps  10-15       Perform Capillary Blood Glucose checks as needed.  Exercise Prescription Changes: Exercise Prescription Changes    Row Name 01/29/18 1400 02/10/18 1300 02/12/18 0900 02/25/18 0900       Response to Exercise   Blood Pressure (Admit)  126/70  132/64  -  124/68    Blood Pressure (Exercise)  120/68  144/82  -  126/80    Blood Pressure (Exit)  122/70  108/70  -   112/78    Heart Rate (Admit)  79 bpm  91 bpm  -  71 bpm    Heart Rate (Exercise)  109 bpm  115 bpm  -  113 bpm    Heart Rate (Exit)  77 bpm  94 bpm  -  92 bpm    Oxygen Saturation (Exit)  97 %  -  -  -    Rating of Perceived Exertion (Exercise)  11  13  -  13    Symptoms  -  none  -  none    Duration  -  Continue with 30 min of aerobic exercise without signs/symptoms of physical distress.  -  Continue with 30 min of aerobic exercise without signs/symptoms of physical distress.    Intensity  -  THRR unchanged  -  THRR unchanged      Progression   Progression  -  Continue to progress workloads to maintain intensity without signs/symptoms of physical distress.  -  Continue to progress workloads to maintain intensity without signs/symptoms of physical distress.    Average METs  -  2.77  -  2.95      Resistance Training   Training  Prescription  -  Yes  -  Yes    Weight  -  3 lbs  -  4 lbs    Reps  -  10-15  -  10-15      Interval Training   Interval Training  -  No  -  No      Treadmill   MPH  -  2  -  2.2    Grade  -  1  -  1    Minutes  -  15  -  15    METs  -  2.81  -  2.99      Recumbant Bike   Level  -  2  -  -    Minutes  -  15  -  -    METs  -  2.2  -  -      NuStep   Level  -  3  -  4    Minutes  -  15  -  15    METs  -  3.3  -  2.9      Home Exercise Plan   Plans to continue exercise at  -  -  [place] 2 additional days to program exercise sessions.  Add 2 additional days to program exercise sessions.    Initial Home Exercises Provided  -  -  02/12/18  02/12/18       Exercise Comments: Exercise Comments    Row Name 02/03/18 0757 02/12/18 0915 02/19/18 0942       Exercise Comments  First full day of exercise!  Patient was oriented to gym and equipment including functions, settings, policies, and procedures.  Patient's individual exercise prescription and treatment plan were reviewed.  All starting workloads  were established based on the results of the 6 minute walk test done at initial orientation visit.  The plan for exercise progression was also introduced and progression will be customized based on patient's performance and goals.  Reviewed home exercise with pt today.  Pt plans to use the [facility] for exercise.  Reviewed THR, pulse, RPE, sign and symptoms, NTG use, and when to call 911 or MD.  Also discussed weather considerations and indoor options.  Pt voiced understanding.  Martin Adkins has started at the [facility] and reports that he definitely noticed his legs being more tired and athritis flaring more.         Exercise Goals and Review: Exercise Goals    Row Name 01/29/18 1437             Exercise Goals   Increase Physical Activity  Yes       Intervention  Provide advice, education, support and counseling about physical activity/exercise needs.;Develop an individualized exercise prescription for aerobic and resistive training based on initial evaluation findings, risk stratification, comorbidities and participant's personal goals.       Expected Outcomes  Short Term: Attend rehab on a regular basis to increase amount of physical activity.;Long Term: Add in home exercise to make exercise part of routine and to increase amount of physical activity.;Long Term: Exercising regularly at least 3-5 days a week.       Increase Strength and Stamina  Yes       Intervention  Provide advice, education, support and counseling about physical activity/exercise needs.;Develop an individualized exercise prescription for aerobic and resistive training based on initial evaluation  findings, risk stratification, comorbidities and participant's personal goals.       Expected Outcomes  Short Term: Increase workloads from initial exercise prescription for resistance, speed, and METs.;Short Term: Perform resistance training exercises routinely during rehab and add in resistance training at home;Long Term: Improve  cardiorespiratory fitness, muscular endurance and strength as measured by increased METs and functional capacity (6MWT)       Able to understand and use rate of perceived exertion (RPE) scale  Yes       Intervention  Provide education and explanation on how to use RPE scale       Expected Outcomes  Short Term: Able to use RPE daily in rehab to express subjective intensity level;Long Term:  Able to use RPE to guide intensity level when exercising independently       Knowledge and understanding of Target Heart Rate Range (THRR)  Yes       Intervention  Provide education and explanation of THRR including how the numbers were predicted and where they are located for reference       Expected Outcomes  Short Term: Able to state/look up THRR;Short Term: Able to use daily as guideline for intensity in rehab;Long Term: Able to use THRR to govern intensity when exercising independently       Able to check pulse independently  Yes       Intervention  Provide education and demonstration on how to check pulse in carotid and radial arteries.;Review the importance of being able to check your own pulse for safety during independent exercise       Expected Outcomes  Short Term: Able to explain why pulse checking is important during independent exercise;Long Term: Able to check pulse independently and accurately       Understanding of Exercise Prescription  Yes       Intervention  Provide education, explanation, and written materials on patient's individual exercise prescription       Expected Outcomes  Short Term: Able to explain program exercise prescription;Long Term: Able to explain home exercise prescription to exercise independently          Exercise Goals Re-Evaluation : Exercise Goals Re-Evaluation    Row Name 02/03/18 0757 02/10/18 1300 02/19/18 0935 02/25/18 0859       Exercise Goal Re-Evaluation   Exercise Goals Review  Increase Physical Activity;Able to understand and use rate of perceived exertion  (RPE) scale;Knowledge and understanding of Target Heart Rate Range (THRR);Increase Strength and Stamina;Understanding of Exercise Prescription  Increase Physical Activity;Increase Strength and Stamina;Understanding of Exercise Prescription  Increase Physical Activity;Able to understand and use rate of perceived exertion (RPE) scale;Knowledge and understanding of Target Heart Rate Range (THRR);Understanding of Exercise Prescription;Increase Strength and Stamina;Able to check pulse independently  Increase Physical Activity;Increase Strength and Stamina;Understanding of Exercise Prescription    Comments  Reviewed RPE scale, THR and program prescription with pt today.  Pt voiced understanding and was given a copy of goals to take home.   Martin Adkins is off to a good start in rehab.  He has completed three full days of exercise so far.  We will be going over his home exercise guidelines soon.  We will continue to monitor his progress.   Martin Adkins has noticed that he is able to do more at home and do it more efficiently. He has to go up and down stairs to do laundry and has found that to be easier since starting cardiac rehab.   Martin Adkins continues to do well in rehab.  He is up to level 4 on the NuStep.  We will continue to monitor his progress.     Expected Outcomes  Short: Use RPE daily to regulate intensity. Long: Follow program prescription in THR.  Short: Review home exercies guidelines.  Long: Continue to increase strength and stamina  Short: He has started exercising at the [facility] and will work up to 3 days per week. Long: Continue exercising and building strength after graduating.  Short: Increase workload on treadmill.  Long: Continue to exercise on off days at [facility]       Discharge Exercise Prescription (Final Exercise Prescription Changes): Exercise Prescription Changes - 02/25/18 0900      Response to Exercise   Blood Pressure (Admit)  124/68    Blood Pressure (Exercise)  126/80    Blood Pressure (Exit)  112/78     Heart Rate (Admit)  71 bpm    Heart Rate (Exercise)  113 bpm    Heart Rate (Exit)  92 bpm    Rating of Perceived Exertion (Exercise)  13    Symptoms  none    Duration  Continue with 30 min of aerobic exercise without signs/symptoms of physical distress.    Intensity  THRR unchanged      Progression   Progression  Continue to progress workloads to maintain intensity without signs/symptoms of physical distress.    Average METs  2.95      Resistance Training   Training Prescription  Yes    Weight  4 lbs    Reps  10-15      Interval Training   Interval Training  No      Treadmill   MPH  2.2    Grade  1    Minutes  15    METs  2.99      NuStep   Level  4    Minutes  15    METs  2.9      Home Exercise Plan   Plans to continue exercise at  [facility]    Frequency  Add 2 additional days to program exercise sessions.    Initial Home Exercises Provided  02/12/18       Nutrition:  Target Goals: Understanding of nutrition guidelines, daily intake of sodium <1575m, cholesterol <2068m calories 30% from fat and 7% or less from saturated fats, daily to have 5 or more servings of fruits and vegetables.  Biometrics: Pre Biometrics - 01/29/18 1437      Pre Biometrics   Height  5' 11.5" (1.816 m)    Weight  212 lb (96.2 kg)    Waist Circumference  42 inches    Hip Circumference  40 inches    Waist to Hip Ratio  1.05 %    BMI (Calculated)  29.16    Single Leg Stand  5.48 seconds        Nutrition Therapy Plan and Nutrition Goals: Nutrition Therapy & Goals - 01/29/18 1445      Intervention Plan   Intervention  Prescribe, educate and counsel regarding individualized specific dietary modifications aiming towards targeted core components such as weight, hypertension, lipid management, diabetes, heart failure and other comorbidities.    Expected Outcomes  Short Term Goal: Understand basic principles of dietary content, such as calories, fat, sodium, cholesterol and  nutrients.;Short Term Goal: A plan has been developed with personal nutrition goals set during dietitian appointment.;Long Term Goal: Adherence to prescribed nutrition plan.       Nutrition Assessments: Nutrition  Assessments - 01/29/18 1446      MEDFICTS Scores   Pre Score  53       Nutrition Goals Re-Evaluation: Nutrition Goals Re-Evaluation    [place] Name 02/19/18 1134             Goals   Current Weight  214 lb (97.1 kg)       Nutrition Goal  Martin Adkins eats plenty of frozen vegetables, some fruits, poultry, and fish. Martin Adkins uses a No Salt (substitute for Salt).       Comment  Encouraged Martin Adkins to bring any nutrition questions he has to class or come by after graduation within new questions. He sees a registered dietitain at the [employer] regularly.       Expected Outcome  Short: Continue eating a heart healthy diet. Long: Eat plenty of fruits and vegetables. Consult with Dietitian here or at the [facility]. Continue using salt substitute.          Nutrition Goals Discharge (Final Nutrition Goals Re-Evaluation): Nutrition Goals Re-Evaluation - 02/19/18 1134      Goals   Current Weight  214 lb (97.1 kg)    Nutrition Goal  Martin Adkins eats plenty of frozen vegetables, some fruits, poultry, and fish. Martin Adkins uses a No Salt (substitute for Salt).    Comment  Encouraged Martin Adkins to bring any nutrition questions he has to class or come by after graduation within new questions. He sees a registered dietitain at the [facility] [employer] regularly.    Expected Outcome  Short: Continue eating a heart healthy diet. Long: Eat plenty of fruits and vegetables. Consult with Dietitian here or at the [facility]. Continue using salt substitute.       Psychosocial: Target Goals: Acknowledge presence or absence of significant depression and/or stress, maximize coping skills, provide positive support system. Participant is able to verbalize types and ability to use techniques and skills needed for reducing stress and  depression.   Initial Review & Psychosocial Screening: Initial Psych Review & Screening - 02/19/18 0946      Initial Review   Current issues with  --    Source of Stress Concerns  Chronic Illness    Comments  Error: Notes should be in Re-Eval.       Family Dynamics   Good Support System?  Yes       Quality of Life Scores:  Quality of Life - 01/29/18 1443      Quality of Life Scores   Health/Function Pre  16.17 %    Socioeconomic Pre  19.64 %    Psych/Spiritual Pre  26 %    Family Pre  4.5 %    GLOBAL Pre  16.92 %      Scores of 19 and below usually indicate a poorer quality of life in these areas.  A difference of  2-3 points is a clinically meaningful difference.  A difference of 2-3 points in the total score of the Quality of Life Index has been associated with significant improvement in overall quality of life, self-image, physical symptoms, and general health in studies assessing change in quality of life.  PHQ-9: Recent Review Flowsheet Data    Depression screen [facility] 2/9 01/29/2018 01/29/2018 11/16/2014 09/26/2014   Decreased Interest - 0 2 1   Down, Depressed, Hopeless 2 0 0 0   PHQ - 2 Score 2 0 2 1   Altered sleeping - 0 0 1   Tired, decreased energy 3 0 2 1   Change  in appetite - 0 0 0   Feeling bad or failure about yourself  - 0 0 0   Trouble concentrating - 0 - 0   Moving slowly or fidgety/restless - 0 0 0   Suicidal thoughts - 0 0 0   PHQ-9 Score - 0 4 3   Difficult doing work/chores Somewhat difficult - Somewhat difficult Somewhat difficult     Interpretation of Total Score  Total Score Depression Severity:  1-4 = Minimal depression, 5-9 = Mild depression, 10-14 = Moderate depression, 15-19 = Moderately severe depression, 20-27 = Severe depression   Psychosocial Evaluation and Intervention: Psychosocial Evaluation - 02/03/18 0906      Psychosocial Evaluation & Interventions   Interventions  Encouraged to exercise with the program and follow exercise  prescription    Comments  Mr. Chandley (Martin Adkins) returned to this program after several years due to a recent "mild heart attack," which is being treated with Rx, Exercise and diet.  He is a 74 year old who has a good support system with a daughter, son and brother who live locally.  He is in good health mostly except his arthritis and his heart condition.  Martin Adkins reports sleeping well and has a good appetite.  He denies a history of depression or anxiety and reports typically being in a positive mood.  Other than his health, he states nothing else is stressful for him currently.  Martin Adkins has goals to increase his stamina and strength and be able to maintain this with consistent exercise.  He plans to return to [employer] here at [facility] gym following completion of this program.  Staff will follow with Martin Adkins.    Expected Outcomes  Short:  Martin Adkins will exercise consistently to regain his stamina and strength and be more heart healthy.  Long:  Martin Adkins will maintain his exercise routine for his overall health.      Continue Psychosocial Services   Follow up required by staff       Psychosocial Re-Evaluation: Psychosocial Re-Evaluation    [place] Name 02/19/18 1125             Psychosocial Re-Evaluation   Current issues with  Current Sleep Concerns;Current Stress Concerns       Comments  Martin Adkins reports sleeping well, average of 8 hours per night. When he does wake up to go to the bathroom, he is able to go right back to sleep. He is stressed by his current health concerns but is being proactive in managing his controllable factors.       Expected Outcomes  Continue to use stress management tools, encouraged to use relaxation, and communicate needs to health care providers.          Psychosocial Discharge (Final Psychosocial Re-Evaluation): Psychosocial Re-Evaluation - 02/19/18 1125      Psychosocial Re-Evaluation   Current issues with  Current Sleep Concerns;Current Stress Concerns    Comments  Martin Adkins reports sleeping well,  average of 8 hours per night. When he does wake up to go to the bathroom, he is able to go right back to sleep. He is stressed by his current health concerns but is being proactive in managing his controllable factors.    Expected Outcomes  Continue to use stress management tools, encouraged to use relaxation, and communicate needs to health care providers.       Vocational Rehabilitation: Provide vocational rehab assistance to qualifying candidates.   Vocational Rehab Evaluation & Intervention: Vocational Rehab - 01/29/18 1446  Initial Vocational Rehab Evaluation & Intervention   Assessment shows need for Vocational Rehabilitation  No       Education: Education Goals: Education classes will be provided on a variety of topics geared toward better understanding of heart health and risk factor modification. Participant will state understanding/return demonstration of topics presented as noted by education test scores.  Learning Barriers/Preferences: Learning Barriers/Preferences - 01/29/18 1446      Learning Barriers/Preferences   Learning Barriers  None    Learning Preferences  None       Education Topics:  AED/CPR: - Group verbal and written instruction with the use of models to demonstrate the basic use of the AED with the basic ABC's of resuscitation.   General Nutrition Guidelines/Fats and Fiber: -Group instruction provided by verbal, written material, models and posters to present the general guidelines for heart healthy nutrition. Gives an explanation and review of dietary fats and fiber.   Cardiac Rehab from 11/21/2014 in [facility] Cardiac and Pulmonary Rehab  Date  10/17/14  Educator  PI  Instruction Review Code (retired)  2- meets goals/outcomes      Controlling Sodium/Reading Food Labels: -Group verbal and written material supporting the discussion of sodium use in heart healthy nutrition. Review and explanation with models, verbal and written materials for  utilization of the food label.   Cardiac Rehab from 11/21/2014 in [facility] Cardiac and Pulmonary Rehab  Date  10/31/14  Educator  CR  Instruction Review Code (retired)  2- meets goals/outcomes      Exercise Physiology & General Exercise Guidelines: - Group verbal and written instruction with models to review the exercise physiology of the cardiovascular system and associated critical values. Provides general exercise guidelines with specific guidelines to those with heart or lung disease.    Cardiac Rehab from 11/21/2014 in [facility] Cardiac and Pulmonary Rehab  Date  11/09/14  Educator  RM  Instruction Review Code (retired)  2- meets goals/outcomes      Aerobic Exercise & Resistance Training: - Gives group verbal and written instruction on the various components of exercise. Focuses on aerobic and resistive training programs and the benefits of this training and how to safely progress through these programs..   Cardiac Rehab from 11/21/2014 in [facility] Cardiac and Pulmonary Rehab  Date  11/14/14  Educator  RM  Instruction Review Code (retired)  2- Statistician, Balance, Mind/Body Relaxation: Provides group verbal/written instruction on the benefits of flexibility and balance training, including mind/body exercise modes such as yoga, pilates and tai chi.  Demonstration and skill practice provided.   Cardiac Rehab from 02/26/2018 in [facility] Cardiac and Pulmonary Rehab  Date  02/03/18  Educator  AS  Instruction Review Code  1- Verbalizes Understanding      Stress and Anxiety: - Provides group verbal and written instruction about the health risks of elevated stress and causes of high stress.  Discuss the correlation between heart/lung disease and anxiety and treatment options. Review healthy ways to manage with stress and anxiety.   Cardiac Rehab from 02/26/2018 in [facility] Cardiac and Pulmonary Rehab  Date  02/24/18  Educator  [facility]  Instruction Review Code  5- Refused Teaching       Depression: - Provides group verbal and written instruction on the correlation between heart/lung disease and depressed mood, treatment options, and the stigmas associated with seeking treatment.   Cardiac Rehab from 11/21/2014 in [facility] Cardiac and Pulmonary Rehab  Date  11/02/14  Educator  Lupita Leash  Instruction Review Code (retired)  2- meets Designer, fashion/clothing & Physiology of the Heart: - Group verbal and written instruction and models provide basic cardiac anatomy and physiology, with the coronary electrical and arterial systems. Review of Valvular disease and Heart Failure   Cardiac Rehab from 02/26/2018 in [facility] Cardiac and Pulmonary Rehab  Date  02/12/18  Educator  CE  Instruction Review Code  5- Refused Teaching      Cardiac Procedures: - Group verbal and written instruction to review commonly prescribed medications for heart disease. Reviews the medication, class of the drug, and side effects. Includes the steps to properly store meds and maintain the prescription regimen. (beta blockers and nitrates)   Cardiac Rehab from 02/26/2018 in [facility] Cardiac and Pulmonary Rehab  Date  02/26/18  Educator  CE  Instruction Review Code  5- Refused Teaching      Cardiac Medications I: - Group verbal and written instruction to review commonly prescribed medications for heart disease. Reviews the medication, class of the drug, and side effects. Includes the steps to properly store meds and maintain the prescription regimen.   Cardiac Rehab from 02/26/2018 in [facility] Cardiac and Pulmonary Rehab  Date  02/17/18  Educator  SB  Instruction Review Code  1- Verbalizes Understanding      Cardiac Medications II: -Group verbal and written instruction to review commonly prescribed medications for heart disease. Reviews the medication, class of the drug, and side effects. (all other drug classes)   Cardiac Rehab from 02/26/2018 in [facility] Cardiac and Pulmonary Rehab  Date  02/05/18  Educator  CE   Instruction Review Code  1- Verbalizes Understanding       Go Sex-Intimacy & Heart Disease, Get SMART - Goal Setting: - Group verbal and written instruction through game format to discuss heart disease and the return to sexual intimacy. Provides group verbal and written material to discuss and apply goal setting through the application of the S.M.A.R.T. Method.   Cardiac Rehab from 02/26/2018 in [facility] Cardiac and Pulmonary Rehab  Date  02/26/18  Educator  CE  Instruction Review Code  5- Refused Teaching      Other Matters of the Heart: - Provides group verbal, written materials and models to describe Stable Angina and Peripheral Artery. Includes description of the disease process and treatment options available to the cardiac patient.   Cardiac Rehab from 02/26/2018 in [facility] Cardiac and Pulmonary Rehab  Date  02/12/18  Educator  CE  Instruction Review Code  1- Verbalizes Understanding      Exercise & Equipment Safety: - Individual verbal instruction and demonstration of equipment use and safety with use of the equipment.   Cardiac Rehab from 02/26/2018 in [facility] Cardiac and Pulmonary Rehab  Date  01/29/18  Educator  sb  Instruction Review Code  1- Verbalizes Understanding      Infection Prevention: - Provides verbal and written material to individual with discussion of infection control including proper hand washing and proper equipment cleaning during exercise session.   Cardiac Rehab from 02/26/2018 in [facility] Cardiac and Pulmonary Rehab  Date  01/29/18  Educator  SB1  Instruction Review Code  1- Verbalizes Understanding      Falls Prevention: - Provides verbal and written material to individual with discussion of falls prevention and safety.   Cardiac Rehab from 02/26/2018 in [facility] Cardiac and Pulmonary Rehab  Date  01/29/18  Educator  sb  Instruction Review Code  1- Verbalizes Understanding  Diabetes: - Individual verbal and written instruction to review signs/symptoms  of diabetes, desired ranges of glucose level fasting, after meals and with exercise. Acknowledge that pre and post exercise glucose checks will be done for 3 sessions at entry of program.   Know Your Numbers and Risk Factors: -Group verbal and written instruction about important numbers in your health.  Discussion of what are risk factors and how they play a role in the disease process.  Review of Cholesterol, Blood Pressure, Diabetes, and BMI and the role they play in your overall health.   Cardiac Rehab from 02/26/2018 in [facility] Cardiac and Pulmonary Rehab  Date  02/05/18  Educator  CE  Instruction Review Code  1- Verbalizes Understanding      Sleep Hygiene: -Provides group verbal and written instruction about how sleep can affect your health.  Define sleep hygiene, discuss sleep cycles and impact of sleep habits. Review good sleep hygiene tips.    Cardiac Rehab from 02/26/2018 in [facility] Cardiac and Pulmonary Rehab  Date  02/10/18  Educator  CE  Instruction Review Code  5- Refused Teaching      Other: -Provides group and verbal instruction on various topics (see comments)   Knowledge Questionnaire Score: Knowledge Questionnaire Score - 01/29/18 1446      Knowledge Questionnaire Score   Pre Score  21/26   Reviewed correct responses with Martin Adkins today. He verbalized understanding and had no further questions today      Core Components/Risk Factors/Patient Goals at Admission: Personal Goals and Risk Factors at Admission - 01/29/18 1447      Core Components/Risk Factors/Patient Goals on Admission   Tobacco Cessation  Yes    Number of packs per day  Quit 2 weeks ago Jan 2020    Intervention  Assist the participant in steps to quit. Provide individualized education and counseling about committing to Tobacco Cessation, relapse prevention, and pharmacological support that can be provided by physician.;Advice worker, assist with locating and accessing local/national Quit  Smoking programs, and support quit date choice.    Expected Outcomes  Short Term: Will demonstrate readiness to quit, by selecting a quit date.;Long Term: Complete abstinence from all tobacco products for at least 12 months from quit date.;Short Term: Will quit all tobacco product use, adhering to prevention of relapse plan.    Heart Failure  Yes    Intervention  Provide a combined exercise and nutrition program that is supplemented with education, support and counseling about heart failure. Directed toward relieving symptoms such as shortness of breath, decreased exercise tolerance, and extremity edema.    Expected Outcomes  Improve functional capacity of life;Short term: Attendance in program 2-3 days a week with increased exercise capacity. Reported lower sodium intake. Reported increased fruit and vegetable intake. Reports medication compliance.;Short term: Daily weights obtained and reported for increase. Utilizing diuretic protocols set by physician.;Long term: Adoption of self-care skills and reduction of barriers for early signs and symptoms recognition and intervention leading to self-care maintenance.   Spoke to [employer] about daily weights and reporting 2-3 pound increase to MD. Martin Adkins has stopped his LAsix "I had to go all the time" Advised Jope to let his MD know he has stopped the LAsix and to check on the potassuim he is prescribed.;   Hypertension  Yes    Intervention  Provide education on lifestyle modifcations including regular physical activity/exercise, weight management, moderate sodium restriction and increased consumption of fresh fruit, vegetables, and low fat dairy, alcohol moderation, and  smoking cessation.;Monitor prescription use compliance.    Expected Outcomes  Short Term: Continued assessment and intervention until BP is < 140/4m HG in hypertensive participants. < 130/838mHG in hypertensive participants with diabetes, heart failure or chronic kidney disease.;Long Term:  Maintenance of blood pressure at goal levels.    Lipids  Yes    Intervention  Provide education and support for participant on nutrition & aerobic/resistive exercise along with prescribed medications to achieve LDL <7059mHDL >53m43m  Expected Outcomes  Short Term: Participant states understanding of desired cholesterol values and is compliant with medications prescribed. Participant is following exercise prescription and nutrition guidelines.;Long Term: Cholesterol controlled with medications as prescribed, with individualized exercise RX and with personalized nutrition plan. Value goals: LDL < 70mg25mL > 40 mg.       Core Components/Risk Factors/Patient Goals Review:  Goals and Risk Factor Review    Row Name 02/19/18 1143             Core Components/Risk Factors/Patient Goals Review   Personal Goals Review  Weight Management/Obesity;Lipids;Hypertension;Heart Failure;Tobacco Cessation;Other       Review  Weight is staying the same. Martin Adkins iWille Glaserkay with that for now. He takes his blood pressure and cholesterol medications as prescribed. He checks his blood pressure at home and gets an average reading of 130/80. He has his [facility] they are watching his platelets. He was on Chemo until his platelets dropped too low and will start chemo again once they return to a normal level. He is smoking 3 cigarettes. He is going to ask his doctor for the patch to quit. He checks his weight and ankles daily for heart failure symptoms.       Expected Outcomes  Short: Take medications as prescribed, check weight, BP, and decrease tobacco use. Long: Quit smoking with help from the patches. Take meds as prescribed. Keep a check of his numbers from bloodwork for lipids.           Core Components/Risk Factors/Patient Goals at Discharge (Final Review):  Goals and Risk Factor Review - 02/19/18 1143      Core Components/Risk Factors/Patient Goals Review   Personal Goals Review   Weight Management/Obesity;Lipids;Hypertension;Heart Failure;Tobacco Cessation;Other    Review  Weight is staying the same. Martin Adkins iWille Glaserkay with that for now. He takes his blood pressure and cholesterol medications as prescribed. He checks his blood pressure at home and gets an average reading of 130/80. He has his bloodwork done [facility] bc they are watching his platelets. He was on Chemo until his platelets dropped too low and will start chemo again once they return to a normal level. He is smoking 3 cigarettes. He is going to ask his doctor for the patch to quit. He checks his weight and ankles daily for heart failure symptoms.    Expected Outcomes  Short: Take medications as prescribed, check weight, BP, and decrease tobacco use. Long: Quit smoking with help from the patches. Take meds as prescribed. Keep a check of his numbers from bloodwork for lipids.        ITP Comments: ITP Comments    Row Name 01/29/18 1424 02/04/18 0935 03/04/18 0541       ITP Comments  Medical review completed . ITP sent to Dr M MilLoleta Chancereview, changes as needed and signature. Documentation of diagnosis can be found in [facility]/2020  30 Day Review. Continue with ITP unless directed  changes per Medical Director review.  30 Day Review. Continue with ITP unless directed changes per Medical Director review.        Comments:

## 2018-03-06 ENCOUNTER — Inpatient Hospital Stay: Payer: Medicare PPO

## 2018-03-06 DIAGNOSIS — R059 Cough, unspecified: Secondary | ICD-10-CM

## 2018-03-06 DIAGNOSIS — Z7901 Long term (current) use of anticoagulants: Secondary | ICD-10-CM

## 2018-03-06 DIAGNOSIS — D696 Thrombocytopenia, unspecified: Secondary | ICD-10-CM

## 2018-03-06 DIAGNOSIS — R05 Cough: Secondary | ICD-10-CM

## 2018-03-06 DIAGNOSIS — C9110 Chronic lymphocytic leukemia of B-cell type not having achieved remission: Secondary | ICD-10-CM

## 2018-03-06 LAB — COMPREHENSIVE METABOLIC PANEL
ALT: 36 U/L (ref 0–44)
AST: 27 U/L (ref 15–41)
Albumin: 4 g/dL (ref 3.5–5.0)
Alkaline Phosphatase: 103 U/L (ref 38–126)
Anion gap: 10 (ref 5–15)
BUN: 20 mg/dL (ref 8–23)
CO2: 26 mmol/L (ref 22–32)
Calcium: 8.2 mg/dL — ABNORMAL LOW (ref 8.9–10.3)
Chloride: 101 mmol/L (ref 98–111)
Creatinine, Ser: 0.86 mg/dL (ref 0.61–1.24)
GFR calc non Af Amer: >60 mL/min (ref >60)
Glucose, Bld: 129 mg/dL — ABNORMAL HIGH (ref 70–99)
Potassium: 2.9 mmol/L — ABNORMAL LOW (ref 3.5–5.1)
SODIUM: 137 mmol/L (ref 135–145)
Total Bilirubin: 1.9 mg/dL — ABNORMAL HIGH (ref 0.3–1.2)
Total Protein: 6.8 g/dL (ref 6.5–8.1)

## 2018-03-06 LAB — CBC WITH DIFFERENTIAL/PLATELET
Abs Immature Granulocytes: 0.03 10*3/uL (ref 0.00–0.07)
Basophils Absolute: 0 10*3/uL (ref 0.0–0.1)
Basophils Relative: 0 %
Eosinophils Absolute: 0.1 10*3/uL (ref 0.0–0.5)
Eosinophils Relative: 1 %
HCT: 43.4 % (ref 39.0–52.0)
Hemoglobin: 14.6 g/dL (ref 13.0–17.0)
Immature Granulocytes: 0 %
Lymphocytes Relative: 19 %
Lymphs Abs: 1.3 10*3/uL (ref 0.7–4.0)
MCH: 30.9 pg (ref 26.0–34.0)
MCHC: 33.6 g/dL (ref 30.0–36.0)
MCV: 91.9 fL (ref 80.0–100.0)
Monocytes Absolute: 0.7 10*3/uL (ref 0.1–1.0)
Monocytes Relative: 10 %
Neutro Abs: 4.7 10*3/uL (ref 1.7–7.7)
Neutrophils Relative %: 70 %
Platelets: 111 10*3/uL — ABNORMAL LOW (ref 150–400)
RBC: 4.72 MIL/uL (ref 4.22–5.81)
RDW: 12.9 % (ref 11.5–15.5)
WBC: 6.7 10*3/uL (ref 4.0–10.5)
nRBC: 0 % (ref 0.0–0.2)

## 2018-03-09 ENCOUNTER — Other Ambulatory Visit: Payer: Self-pay

## 2018-03-09 DIAGNOSIS — E876 Hypokalemia: Secondary | ICD-10-CM

## 2018-03-10 ENCOUNTER — Encounter: Payer: Medicare PPO | Attending: Cardiology | Admitting: *Deleted

## 2018-03-10 DIAGNOSIS — Z7982 Long term (current) use of aspirin: Secondary | ICD-10-CM | POA: Insufficient documentation

## 2018-03-10 DIAGNOSIS — R7303 Prediabetes: Secondary | ICD-10-CM | POA: Diagnosis not present

## 2018-03-10 DIAGNOSIS — Z79899 Other long term (current) drug therapy: Secondary | ICD-10-CM | POA: Insufficient documentation

## 2018-03-10 DIAGNOSIS — G4733 Obstructive sleep apnea (adult) (pediatric): Secondary | ICD-10-CM | POA: Diagnosis not present

## 2018-03-10 DIAGNOSIS — I110 Hypertensive heart disease with heart failure: Secondary | ICD-10-CM | POA: Insufficient documentation

## 2018-03-10 DIAGNOSIS — I214 Non-ST elevation (NSTEMI) myocardial infarction: Secondary | ICD-10-CM | POA: Diagnosis not present

## 2018-03-10 DIAGNOSIS — M069 Rheumatoid arthritis, unspecified: Secondary | ICD-10-CM | POA: Diagnosis not present

## 2018-03-10 DIAGNOSIS — K219 Gastro-esophageal reflux disease without esophagitis: Secondary | ICD-10-CM | POA: Diagnosis not present

## 2018-03-10 DIAGNOSIS — I4891 Unspecified atrial fibrillation: Secondary | ICD-10-CM | POA: Diagnosis not present

## 2018-03-10 DIAGNOSIS — E785 Hyperlipidemia, unspecified: Secondary | ICD-10-CM | POA: Insufficient documentation

## 2018-03-10 DIAGNOSIS — Z87891 Personal history of nicotine dependence: Secondary | ICD-10-CM | POA: Diagnosis not present

## 2018-03-10 DIAGNOSIS — I5022 Chronic systolic (congestive) heart failure: Secondary | ICD-10-CM | POA: Diagnosis not present

## 2018-03-10 DIAGNOSIS — I739 Peripheral vascular disease, unspecified: Secondary | ICD-10-CM | POA: Insufficient documentation

## 2018-03-10 DIAGNOSIS — I2510 Atherosclerotic heart disease of native coronary artery without angina pectoris: Secondary | ICD-10-CM | POA: Insufficient documentation

## 2018-03-10 NOTE — Progress Notes (Signed)
Daily Session Note  Patient Details  Name: Martin Adkins MRN: [id number] Date of Birth: Oct 07, 1945 Referring Provider:     Cardiac Rehab from 01/29/2018 in [facility] Cardiac and Pulmonary Rehab  Referring Provider  [name]      Encounter Date: 03/10/2018  Check In: Session Check In - 03/10/18 0759      Check-In   Supervising physician immediately available to respond to emergencies  See telemetry face sheet for immediately available ER MD    Location  [facility]-Cardiac & Pulmonary Rehab    Staff Present  Heath Lark, RN, BSN, CCRP;Jeanna Durrell BS, Exercise Physiologist;Quron Ruddy [place], MA, RCEP, CCRP, Exercise Physiologist    Medication changes reported      No    Fall or balance concerns reported     No    Warm-up and Cool-down  Performed as group-led Higher education careers adviser Performed  Yes    VAD Patient?  No    PAD/SET Patient?  No      Pain Assessment   Currently in Pain?  No/denies          Social History   Tobacco Use  Smoking Status Former Smoker  . Packs/day: 0.50  . Years: 20.00  . Pack years: 10.00  . Types: Cigarettes  Smokeless Tobacco Never Used  Tobacco Comment   Quit     Goals Met:  Independence with exercise equipment Exercise tolerated well No report of cardiac concerns or symptoms Strength training completed today  Goals Unmet:  Not Applicable  Comments: Pt able to follow exercise prescription today without complaint.  Will continue to monitor for progression.    Dr. Emily Filbert is Medical Director for [facility].

## 2018-03-12 ENCOUNTER — Encounter: Payer: Medicare PPO | Admitting: *Deleted

## 2018-03-12 ENCOUNTER — Inpatient Hospital Stay: Payer: Medicare PPO | Attending: Oncology

## 2018-03-12 DIAGNOSIS — C9190 Lymphoid leukemia, unspecified not having achieved remission: Secondary | ICD-10-CM | POA: Diagnosis not present

## 2018-03-12 DIAGNOSIS — I252 Old myocardial infarction: Secondary | ICD-10-CM | POA: Diagnosis not present

## 2018-03-12 DIAGNOSIS — I10 Essential (primary) hypertension: Secondary | ICD-10-CM | POA: Diagnosis not present

## 2018-03-12 DIAGNOSIS — I2510 Atherosclerotic heart disease of native coronary artery without angina pectoris: Secondary | ICD-10-CM | POA: Insufficient documentation

## 2018-03-12 DIAGNOSIS — R5383 Other fatigue: Secondary | ICD-10-CM | POA: Diagnosis not present

## 2018-03-12 DIAGNOSIS — D696 Thrombocytopenia, unspecified: Secondary | ICD-10-CM

## 2018-03-12 DIAGNOSIS — R161 Splenomegaly, not elsewhere classified: Secondary | ICD-10-CM | POA: Insufficient documentation

## 2018-03-12 DIAGNOSIS — Z7982 Long term (current) use of aspirin: Secondary | ICD-10-CM | POA: Insufficient documentation

## 2018-03-12 DIAGNOSIS — Z86718 Personal history of other venous thrombosis and embolism: Secondary | ICD-10-CM | POA: Insufficient documentation

## 2018-03-12 DIAGNOSIS — J40 Bronchitis, not specified as acute or chronic: Secondary | ICD-10-CM | POA: Diagnosis not present

## 2018-03-12 DIAGNOSIS — N400 Enlarged prostate without lower urinary tract symptoms: Secondary | ICD-10-CM | POA: Insufficient documentation

## 2018-03-12 DIAGNOSIS — I214 Non-ST elevation (NSTEMI) myocardial infarction: Secondary | ICD-10-CM | POA: Diagnosis not present

## 2018-03-12 DIAGNOSIS — Z79899 Other long term (current) drug therapy: Secondary | ICD-10-CM | POA: Diagnosis not present

## 2018-03-12 DIAGNOSIS — E538 Deficiency of other specified B group vitamins: Secondary | ICD-10-CM | POA: Diagnosis not present

## 2018-03-12 DIAGNOSIS — M069 Rheumatoid arthritis, unspecified: Secondary | ICD-10-CM | POA: Diagnosis not present

## 2018-03-12 DIAGNOSIS — M129 Arthropathy, unspecified: Secondary | ICD-10-CM | POA: Insufficient documentation

## 2018-03-12 DIAGNOSIS — Z8042 Family history of malignant neoplasm of prostate: Secondary | ICD-10-CM | POA: Insufficient documentation

## 2018-03-12 DIAGNOSIS — Z87891 Personal history of nicotine dependence: Secondary | ICD-10-CM | POA: Insufficient documentation

## 2018-03-12 DIAGNOSIS — I739 Peripheral vascular disease, unspecified: Secondary | ICD-10-CM | POA: Diagnosis not present

## 2018-03-12 DIAGNOSIS — Z803 Family history of malignant neoplasm of breast: Secondary | ICD-10-CM | POA: Insufficient documentation

## 2018-03-12 LAB — CBC WITH DIFFERENTIAL/PLATELET
Abs Immature Granulocytes: 0.01 10*3/uL (ref 0.00–0.07)
Basophils Absolute: 0 10*3/uL (ref 0.0–0.1)
Basophils Relative: 1 %
EOS PCT: 1 %
Eosinophils Absolute: 0 10*3/uL (ref 0.0–0.5)
HCT: 43.4 % (ref 39.0–52.0)
HEMOGLOBIN: 14.4 g/dL (ref 13.0–17.0)
Immature Granulocytes: 0 %
LYMPHS ABS: 0.8 10*3/uL (ref 0.7–4.0)
Lymphocytes Relative: 13 %
MCH: 30.8 pg (ref 26.0–34.0)
MCHC: 33.2 g/dL (ref 30.0–36.0)
MCV: 92.9 fL (ref 80.0–100.0)
MONO ABS: 0.5 10*3/uL (ref 0.1–1.0)
MONOS PCT: 9 %
NRBC: 0 % (ref 0.0–0.2)
Neutro Abs: 4.7 10*3/uL (ref 1.7–7.7)
Neutrophils Relative %: 76 %
Platelets: 94 10*3/uL — ABNORMAL LOW (ref 150–400)
RBC: 4.67 MIL/uL (ref 4.22–5.81)
RDW: 13.2 % (ref 11.5–15.5)
WBC: 6 10*3/uL (ref 4.0–10.5)

## 2018-03-12 LAB — COMPREHENSIVE METABOLIC PANEL
ALT: 23 U/L (ref 0–44)
AST: 25 U/L (ref 15–41)
Albumin: 4.2 g/dL (ref 3.5–5.0)
Alkaline Phosphatase: 107 U/L (ref 38–126)
Anion gap: 8 (ref 5–15)
BUN: 15 mg/dL (ref 8–23)
CO2: 23 mmol/L (ref 22–32)
Calcium: 8.6 mg/dL — ABNORMAL LOW (ref 8.9–10.3)
Chloride: 106 mmol/L (ref 98–111)
Creatinine, Ser: 0.86 mg/dL (ref 0.61–1.24)
GFR calc Af Amer: >60 mL/min (ref >60)
GFR calc non Af Amer: >60 mL/min (ref >60)
Glucose, Bld: 108 mg/dL — ABNORMAL HIGH (ref 70–99)
Potassium: 4.2 mmol/L (ref 3.5–5.1)
Sodium: 137 mmol/L (ref 135–145)
Total Bilirubin: 1.7 mg/dL — ABNORMAL HIGH (ref 0.3–1.2)
Total Protein: 7.5 g/dL (ref 6.5–8.1)

## 2018-03-12 NOTE — Progress Notes (Signed)
Daily Session Note  Patient Details  Name: Martin Adkins MRN: [id number] Date of Birth: 1945/08/10 Referring Provider:     Cardiac Rehab from 01/29/2018 in [facility] Cardiac and Pulmonary Rehab  Referring Provider  [name]      Encounter Date: 03/12/2018  Check In: Session Check In - 03/12/18 0753      Check-In   Supervising physician immediately available to respond to emergencies  See telemetry face sheet for immediately available ER MD    Location  [facility]-Cardiac & Pulmonary Rehab    Staff Present  Jasper Loser BS, Exercise Physiologist;Carroll Enterkin, RN, Levie Heritage, MA, RCEP, CCRP, Exercise Physiologist    Medication changes reported      No    Fall or balance concerns reported     No    Warm-up and Cool-down  Performed as group-led instruction    Resistance Training Performed  Yes    VAD Patient?  No    PAD/SET Patient?  No      Pain Assessment   Currently in Pain?  No/denies          Social History   Tobacco Use  Smoking Status Former Smoker  . Packs/day: 0.50  . Years: 20.00  . Pack years: 10.00  . Types: Cigarettes  Smokeless Tobacco Never Used  Tobacco Comment   Quit     Goals Met:  Independence with exercise equipment Exercise tolerated well No report of cardiac concerns or symptoms Strength training completed today  Goals Unmet:  Not Applicable  Comments: Pt able to follow exercise prescription today without complaint.  Will continue to monitor for progression.    Dr. Emily Filbert is Medical Director for [facility].

## 2018-03-16 ENCOUNTER — Inpatient Hospital Stay: Payer: Medicare PPO | Attending: Oncology | Admitting: Oncology

## 2018-03-16 ENCOUNTER — Other Ambulatory Visit: Payer: Self-pay

## 2018-03-16 ENCOUNTER — Inpatient Hospital Stay: Payer: Medicare PPO

## 2018-03-16 ENCOUNTER — Encounter: Payer: Self-pay | Admitting: Oncology

## 2018-03-16 VITALS — BP 118/85 | HR 79 | Temp 96.9°F | Resp 18 | Wt 210.3 lb

## 2018-03-16 DIAGNOSIS — K449 Diaphragmatic hernia without obstruction or gangrene: Secondary | ICD-10-CM | POA: Insufficient documentation

## 2018-03-16 DIAGNOSIS — M549 Dorsalgia, unspecified: Secondary | ICD-10-CM | POA: Diagnosis not present

## 2018-03-16 DIAGNOSIS — Z7901 Long term (current) use of anticoagulants: Secondary | ICD-10-CM | POA: Diagnosis not present

## 2018-03-16 DIAGNOSIS — E538 Deficiency of other specified B group vitamins: Secondary | ICD-10-CM

## 2018-03-16 DIAGNOSIS — I252 Old myocardial infarction: Secondary | ICD-10-CM | POA: Insufficient documentation

## 2018-03-16 DIAGNOSIS — I2510 Atherosclerotic heart disease of native coronary artery without angina pectoris: Secondary | ICD-10-CM

## 2018-03-16 DIAGNOSIS — M069 Rheumatoid arthritis, unspecified: Secondary | ICD-10-CM | POA: Diagnosis not present

## 2018-03-16 DIAGNOSIS — K760 Fatty (change of) liver, not elsewhere classified: Secondary | ICD-10-CM | POA: Diagnosis not present

## 2018-03-16 DIAGNOSIS — G8929 Other chronic pain: Secondary | ICD-10-CM | POA: Insufficient documentation

## 2018-03-16 DIAGNOSIS — R5383 Other fatigue: Secondary | ICD-10-CM

## 2018-03-16 DIAGNOSIS — Z8042 Family history of malignant neoplasm of prostate: Secondary | ICD-10-CM | POA: Insufficient documentation

## 2018-03-16 DIAGNOSIS — Z803 Family history of malignant neoplasm of breast: Secondary | ICD-10-CM | POA: Insufficient documentation

## 2018-03-16 DIAGNOSIS — N400 Enlarged prostate without lower urinary tract symptoms: Secondary | ICD-10-CM

## 2018-03-16 DIAGNOSIS — Z79899 Other long term (current) drug therapy: Secondary | ICD-10-CM | POA: Insufficient documentation

## 2018-03-16 DIAGNOSIS — R161 Splenomegaly, not elsewhere classified: Secondary | ICD-10-CM | POA: Diagnosis not present

## 2018-03-16 DIAGNOSIS — I4891 Unspecified atrial fibrillation: Secondary | ICD-10-CM | POA: Insufficient documentation

## 2018-03-16 DIAGNOSIS — E785 Hyperlipidemia, unspecified: Secondary | ICD-10-CM | POA: Diagnosis not present

## 2018-03-16 DIAGNOSIS — I509 Heart failure, unspecified: Secondary | ICD-10-CM | POA: Diagnosis not present

## 2018-03-16 DIAGNOSIS — I714 Abdominal aortic aneurysm, without rupture: Secondary | ICD-10-CM | POA: Insufficient documentation

## 2018-03-16 DIAGNOSIS — D696 Thrombocytopenia, unspecified: Secondary | ICD-10-CM

## 2018-03-16 DIAGNOSIS — Z86718 Personal history of other venous thrombosis and embolism: Secondary | ICD-10-CM | POA: Diagnosis not present

## 2018-03-16 DIAGNOSIS — D6959 Other secondary thrombocytopenia: Secondary | ICD-10-CM | POA: Diagnosis not present

## 2018-03-16 DIAGNOSIS — Z7982 Long term (current) use of aspirin: Secondary | ICD-10-CM

## 2018-03-16 DIAGNOSIS — D6861 Antiphospholipid syndrome: Secondary | ICD-10-CM | POA: Diagnosis not present

## 2018-03-16 DIAGNOSIS — I739 Peripheral vascular disease, unspecified: Secondary | ICD-10-CM | POA: Diagnosis not present

## 2018-03-16 DIAGNOSIS — C9190 Lymphoid leukemia, unspecified not having achieved remission: Secondary | ICD-10-CM

## 2018-03-16 DIAGNOSIS — C9110 Chronic lymphocytic leukemia of B-cell type not having achieved remission: Secondary | ICD-10-CM | POA: Insufficient documentation

## 2018-03-16 DIAGNOSIS — J40 Bronchitis, not specified as acute or chronic: Secondary | ICD-10-CM | POA: Diagnosis not present

## 2018-03-16 DIAGNOSIS — K219 Gastro-esophageal reflux disease without esophagitis: Secondary | ICD-10-CM | POA: Insufficient documentation

## 2018-03-16 DIAGNOSIS — Z87891 Personal history of nicotine dependence: Secondary | ICD-10-CM

## 2018-03-16 DIAGNOSIS — M129 Arthropathy, unspecified: Secondary | ICD-10-CM

## 2018-03-16 DIAGNOSIS — I10 Essential (primary) hypertension: Secondary | ICD-10-CM | POA: Insufficient documentation

## 2018-03-16 NOTE — Progress Notes (Signed)
Hematology/Oncology Follow up note [facility] Telephone:(336) [phone] Fax:(336) [phone]   Patient Care Team: [name], MD as PCP - General (Internal Medicine) Earlie Server, MD as Medical Oncologist (Medical Oncology)  REFERRING PROVIDER: [name], MD  REASON FOR VISIT Follow up for management of CLL HISTORY OF PRESENTING ILLNESS:  Martin Adkins is a  73 y.o.  male with PMH listed below who was referred to me for evaluation of lymphocytosis.  Recent lab work on 03/05/2017 showed wbc 11.4, hb 14.6, platelet count 117,000, lymphocytosis 63.5%, neutrophil 22%, Report chronic fatigue, no weight loss, fever or chills.  He reports feeling chest "rattleness". Has for CT chest which showed acute finding, chronic finding includes  postsurgical changes consistent with coronary bypass grafting. One of the bypass grafts arising from the aorta shows apparent thrombosis and an area of distal aneurysmal dilatation which is also Thrombosed. Right adrenal adenoma stable in appearance.Mild scarring in the lung bases.Fatty liver.   Takes Aspirin 15m, coumadine, plavix. He is on anticoagulation for previous history of clots.  Denies any bleeding events. Use to drink hard liquir daily, quitted for 4-5 years. Current drinks wine on weekend.    Image Studies # 04/04/2017  [place]dom[place] showed 1.  Splenomegaly.  No focal splenic lesions evident.2. Increased liver echogenicity, a finding most likely indicative of hepatic steatosis. While no focal liver lesions are evident on this study, it must be cautioned that the sensitivity of ultrasound for detection of focal liver lesions is diminished in this circumstance.3. Pancreas obscured by gas. Portions of inferior vena cava obscured by gas.4.  Gallbladder absent.5. Status post abdominal aortic aneurysm repair. No periaortic fluid.  # 04/21/2017 PET scan 1. Moderate splenomegaly with uniform splenic hypermetabolism,compatible  with the provided history of lymphoproliferative disease.No additional hypermetabolic sites of lymphoproliferative disease. Specifically, no hypermetabolic lymphadenopathy Hepatic steatosis, aortic atherosclerosis. Aneurysm.    # CLL, stage IV disease given spelencemagly, thrombocytopenia, symptomatic with fatigue and weight loss.  CLL IPL score 4, high risk.   # status post abdominal aortic aneurysm repair on 09/25/2017 s/p cardiac cath which revealed patent LIMA to the LAD, no significant disease in the RCA with a 90% stenosis in a small RV marginal branch.  Occluded left circumflex, occluded LAD.  Saphenous vein grafts are all occluded.  RCA does not require grafting.  LAD is well grafted by the left internal mammary.  Medical management  INTERVAL HISTORY Gal JJAIZON DEROOSis a 73y.o. male who has above presents for assessing toxicities from Venetoclax for treatment for CLL.  He has been off venetoclax since 11/11/2017. Patient was treated for acute bronchitis/upper respiratory infection last visit. He was given a course of prednisone taper, azithromycin.  Influenza was negative.  Reports feeling better.  Cough has resolved.  No wheezing or chest congestion today. Denies any fever or chills.  Reports chronic back pain with sciatica.  Was seen by primary care provider Dr. JWynetta Emerylast week.  X-ray of lumbar spine.  Patient reports drinking alcohol with hard liquor on the weekends. Review of Systems  Constitutional: Positive for fatigue. Negative for appetite change, chills, fever and unexpected weight change.  HENT:   Negative for hearing loss and voice change.   Eyes: Negative for eye problems and icterus.  Respiratory: Negative for chest tightness, cough, shortness of breath and wheezing.   Cardiovascular: Negative for chest pain and leg swelling.  Gastrointestinal: Negative for abdominal distention and abdominal pain.  Endocrine: Negative for hot flashes.  Genitourinary: Negative for  difficulty urinating, dysuria and frequency.   Musculoskeletal: Positive for back pain. Negative for arthralgias.  Skin: Negative for itching and rash.  Neurological: Negative for light-headedness and numbness.  Hematological: Negative for adenopathy. Bruises/bleeds easily.  Psychiatric/Behavioral: Negative for confusion.     MEDICAL HISTORY:  Past Medical History:  Diagnosis Date  . AAA (abdominal aortic aneurysm) without rupture ([place]) 04/05/2014  . AAA (abdominal aortic aneurysm) without rupture ([place]) 04/05/2014  . Abdominal aortic aneurysm without rupture ([place])   . Acute non-ST elevation myocardial infarction (NSTEMI) ([place]) 08/15/2014  . 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/28/2014  . APS (antiphospholipid syndrome) ([place])   . Arteriosclerosis of coronary artery 04/05/2014   Overview:  Sp cabg with lima to lad svg to d1 om1 rca 2004 with occluded svg to rca and d1 and pci stetn of om1 graft 2015   . Arthritis   . B12 deficiency 03/21/2017  . Barrett's esophagus   . Benign essential HTN 05/02/2014  . BPH (benign prostatic hyperplasia)   . CAD (coronary artery disease)   . CHF (congestive heart failure) ([place])   . Chronic systolic heart failure ([place]) 04/19/2014   Overview:  With segmental LV systolic dysfunction ejection fraction of 35%   . CLL (chronic lymphocytic leukemia) ([place])   . CLL (chronic lymphocytic leukemia) ([place]) 05/24/2017  . DVT (deep venous thrombosis) ([place])   . Dysrhythmia    ATRIAL FIB.  Marland Kitchen GERD (gastroesophageal reflux disease)   . History of hiatal hernia   . History of shingles 2013  . Hyperlipemia   . Hyperplastic polyps of stomach   . Hypertension   . Myocardial infarction ([place]) 07/30/2014  . NSTEMI (non-ST elevated myocardial infarction) ([place])   . OSA (obstructive sleep apnea)   . Osteoarthritis   . Pre-diabetes   . PVD (peripheral vascular disease) ([place])   . RA (rheumatoid arthritis) ([place])   . SOB (shortness of breath)     SURGICAL HISTORY: Past Surgical  History:  Procedure Laterality Date  . ABDOMINAL AORTA STENT    . CHOLECYSTECTOMY    . COLONOSCOPY  11/24/2009  . CORONARY ANGIOPLASTY WITH STENT PLACEMENT  2015  . CORONARY ARTERY BYPASS GRAFT    . ENDOVASCULAR REPAIR/STENT GRAFT N/A 09/24/2017   Procedure: ENDOVASCULAR REPAIR/STENT GRAFT;  Surgeon: Algernon Huxley, MD;  Location: [facility];  Service: Cardiovascular;  Laterality: N/A;  . ESOPHAGOGASTRODUODENOSCOPY  05/26/02  03/23/12  . ESOPHAGOGASTRODUODENOSCOPY (EGD) WITH PROPOFOL N/A 10/28/2016   Procedure: ESOPHAGOGASTRODUODENOSCOPY (EGD) WITH PROPOFOL;  Surgeon: Manya Silvas, MD;  [facility];  Service: Endoscopy;  Laterality: N/A;  . LEFT HEART CATH AND CORONARY ANGIOGRAPHY N/A 01/19/2018   Procedure: LEFT HEART CATH AND CORONARY ANGIOGRAPHY;  Surgeon: Teodoro Spray, MD;  Location: [place] [facility];  Service: Cardiovascular;  Laterality: N/A;  . PERIPHERAL VASCULAR CATHETERIZATION N/A 09/06/2014   Procedure: IVC Filter Removal;  Surgeon: Katha Cabal, MD;  Location: [place] [facility];  Service: Cardiovascular;  Laterality: N/A;  . TRANSURETHRAL RESECTION OF PROSTATE N/A 02/20/2015   Procedure: TRANSURETHRAL RESECTION OF THE PROSTATE (TURP);  Surgeon: Hollice Espy, MD;  Location: [facility] OR[facility];  Service: Urology;  Laterality: N/A;    SOCIAL HISTORY: Social History   Socioeconomic History  . Marital status: Legally Separated    Spouse name: Not on file  . Number of children: Not on file  . Years of education: Not on file  . Highest education level: Not on file  Occupational History  .  Not on file  Social Needs  . Financial resource strain: Not hard at all  . Food insecurity:    Worry: Never true    Inability: Never true  . Transportation needs:    Medical: No    Non-medical: No  Tobacco Use  . Smoking status: Former Smoker    Packs/day: 0.50    Years: 20.00    Pack years: 10.00    Types: Cigarettes  . Smokeless tobacco: Never Used  .  Tobacco comment: Quit   Substance and Sexual Activity  . Alcohol use: Yes    Alcohol/week: 0.0 standard drinks    Comment: social on weekends  . Drug use: No  . Sexual activity: Never  Lifestyle  . Physical activity:    Days per week: 7 days    Minutes per session: 40 min  . Stress: Not at all  Relationships  . Social connections:    Talks on phone: More than three times a week    Gets together: More than three times a week    Attends religious service: More than 4 times per year    Active member of club or organization: No    Attends meetings of clubs or organizations: Never    Relationship status: Separated  . Intimate partner violence:    Fear of current or ex partner: No    Emotionally abused: No    Physically abused: No    Forced sexual activity: No  Other Topics Concern  . Not on file  Social History Narrative  . Not on file    FAMILY HISTORY: Family History  Problem Relation Age of Onset  . Cancer Mother 68       breast ca  . Diabetes Mother   . Coronary artery disease Father   . Heart attack Father   . Prostate cancer Brother   . Bladder Cancer Neg Hx   . Kidney cancer Neg Hx     ALLERGIES:  is allergic to ace inhibitors; pravastatin; rosuvastatin; simvastatin; amoxicillin; niacin; and penicillins.  MEDICATIONS:  Current Outpatient Medications  Medication Sig Dispense Refill  . albuterol (PROVENTIL HFA;VENTOLIN HFA) 108 (90 Base) MCG/ACT inhaler Inhale 2 puffs into the lungs every 6 (six) hours as needed for wheezing or shortness of breath. (Patient not taking: Reported on 02/27/2018) 1 Inhaler 0  . amLODipine (NORVASC) 10 MG tablet Take 10 mg by mouth daily.     Marland Kitchen atorvastatin (LIPITOR) 80 MG tablet Take 80 mg by mouth daily.   11  . azithromycin (ZITHROMAX) 250 MG tablet Take 2 tabs on day 1 and take 1 tab every day until finish. 6 each 0  . Cyanocobalamin 1000 MCG CAPS Take 1,000 mcg by mouth daily. 90 capsule 0  . dorzolamide (TRUSOPT) 2 % ophthalmic  solution INSTILL ONE DROP IN [facility] EYE TWICE DAILY    . fluticasone (FLONASE) 50 MCG/ACT nasal spray Place 2 sprays into both nostrils daily.     Marland Kitchen gabapentin (NEURONTIN) 300 MG capsule Take 1 capsule (300 mg total) by mouth 3 (three) times daily. (Patient taking differently: Take 300 mg by mouth 2 (two) times daily. ) 90 capsule 0  . guaiFENesin 200 MG tablet Take 1 tablet (200 mg total) by mouth every 4 (four) hours as needed for cough or to loosen phlegm. 30 suppository 0  . hydroxychloroquine (PLAQUENIL) 200 MG tablet Take 200 mg by mouth every other day.     . Ipratropium-Albuterol (COMBIVENT) 20-100 MCG/ACT AERS respimat Inhale 1 puff  into the lungs every 6 (six) hours. 1 Inhaler 11  . isosorbide mononitrate (IMDUR) 60 MG 24 hr tablet Take 1 tablet (60 mg total) by mouth daily. 30 tablet 0  . latanoprost (XALATAN) 0.005 % ophthalmic solution INSTILL ONE DROP IN [facility] EYE AT BEDTIME    . metoprolol succinate (TOPROL-XL) 100 MG 24 hr tablet Take 100 mg by mouth daily.     . montelukast (SINGULAIR) 10 MG tablet Take 1 tablet (10 mg total) by mouth as needed. Take before Gazyva infusion. 20 tablet 0  . pantoprazole (PROTONIX) 40 MG tablet Take 40 mg by mouth daily.     . potassium chloride SA (K-DUR,KLOR-CON) 20 MEQ tablet Take 20 mEq by mouth daily.     . predniSONE (STERAPRED UNI-PAK 21 TAB) 10 MG (21) TBPK tablet Day 1 take 6 tabs, Day 2 take 5 tabs, Day 3 take 4 tabs, Day 4 take 3 tabs, Day 5 take 2 tabs, Day 6 take 1 tab 21 tablet 0  . rivaroxaban (XARELTO) 20 MG TABS tablet Take 1 tablet (20 mg total) by mouth daily with supper. 30 tablet 3   No current facility-administered medications for this visit.      PHYSICAL EXAMINATION: ECOG 1 Vitals:   03/16/18 1307  BP: 118/85  Pulse: 79  Resp: 18  Temp: (!) 96.9 F (36.1 C)  Physical Exam  Constitutional: He is oriented to person, place, and time. No distress.  HENT:  Head: Normocephalic and atraumatic.  Nose: Nose normal.    Mouth/Throat: Oropharynx is clear and moist. No oropharyngeal exudate.  Eyes: Pupils are equal, round, and reactive to light. EOM are normal. Left eye exhibits no discharge. No scleral icterus.  Neck: Normal range of motion. Neck supple. No JVD present.  Cardiovascular: Normal rate, regular rhythm and normal heart sounds.  No murmur heard. Pulmonary/Chest: Effort normal. No respiratory distress. He has no wheezes. He has no rales. He exhibits no tenderness.  Abdominal: Soft. He exhibits no distension. There is no abdominal tenderness.  Musculoskeletal: Normal range of motion.        General: No deformity or edema.  Lymphadenopathy:    He has no cervical adenopathy.  Neurological: He is alert and oriented to person, place, and time. No cranial nerve deficit. He exhibits normal muscle tone. Coordination normal.  Skin: Skin is warm and dry. He is not diaphoretic. No erythema.  Psychiatric: Affect normal.  Nursing note and vitals reviewed.    LABORATORY DATA:  I have reviewed the data as listed Lab Results  Component Value Date   WBC 6.0 03/12/2018   HGB 14.4 03/12/2018   HCT 43.4 03/12/2018   MCV 92.9 03/12/2018   PLT 94 (L) 03/12/2018   Recent Labs    04/09/17 1155  02/27/18 1330 03/06/18 1325 03/12/18 0955  NA  --    < > 141 137 137  K  --    < > 3.5 2.9* 4.2  CL  --    < > 106 101 106  CO2  --    < > '25 26 23  ' GLUCOSE  --    < > 135* 129* 108*  BUN  --    < > '15 20 15  ' CREATININE  --    < > 0.89 0.86 0.86  CALCIUM  --    < > 8.5* 8.2* 8.6*  GFRNONAA  --    < > >60 >60 >60  GFRAA  --    < > >60 >  60 >60  PROT  --    < > 7.1 6.8 7.5  ALBUMIN  --    < > 4.2 4.0 4.2  AST  --    < > '26 27 25  ' ALT  --    < > 21 36 23  ALKPHOS  --    < > 116 103 107  BILITOT 1.7*   < > 1.6* 1.9* 1.7*  BILIDIR 0.3  --   --   --   --    < > = values in this interval not displayed.   Hepatitis panel negative.  LDH 228, mildly elevated Beta 2 microglobulin normal.   Bone marrow biopsy   Showed hypercellular marrow involved with non Hodgkin's B cell lymphoma. The morphologic and immunophenotypic features are most consistent with chronic lymphocytic leukemia/small lymphocytic lymphoma. Bone marrow Cytogenetics : normal.  Peripheral blood FISH panel negative for CCND1- IGH mutation, ATM, 12, 13q, Tp53 mutation.   RADIOGRAPHIC STUDIES: I have personally reviewed the radiological images as listed and agreed with the findings in the report. CT angio abd/pelvis showed Type 1b endoleak, AAA aneurysm, Spleen has decreased in size since PET scan in April 2019.   ASSESSMENT & PLAN:  73 yo male presents for immunotherapy treatment of Stage IV CLL. 1. CLL (chronic lymphocytic leukemia) ([place])   2. Thrombocytopenia ([place])   3. Bronchitis    # Acute bronchitis/URI , resolved. #Thrombocytopenia, due to combination of bone marrow suppression from Venetoclax, slowly recovering.  Fluctuating. Also may be secondary to alcohol use.  I discussed with patient that I recommend cut down alcohol consumption.  He voices understanding.  #CLL, stable.  Hold Venetoclax treatments.  # Chronic anticoagulation on Xarelto.  Continue to monitor platelet counts.. If platelt drops below 50, will hold.  . # History of vitamin B12 deficiency continue vitamin B12 1044mg daily.    Repeat labs 4 weeks and MD assessment.  ZEarlie Server MD, PhD Hematology Oncology CBaraga County Memorial Hospitalat ABlackberry CenterPager- [phone]/09/2018

## 2018-03-16 NOTE — Progress Notes (Signed)
Pt here for follow up. Pt complains of pain to neck, dizziness and eyes hurting.

## 2018-03-19 ENCOUNTER — Encounter: Payer: Medicare PPO | Admitting: *Deleted

## 2018-03-19 ENCOUNTER — Other Ambulatory Visit: Payer: Self-pay

## 2018-03-19 DIAGNOSIS — I214 Non-ST elevation (NSTEMI) myocardial infarction: Secondary | ICD-10-CM

## 2018-03-19 NOTE — Progress Notes (Signed)
Daily Session Note  Patient Details  Name: Martin Adkins MRN: [id number] Date of Birth: 11-17-1945 Referring Provider:     Cardiac Rehab from 01/29/2018 in [facility]  Referring Provider  [name]      Encounter Date: 03/19/2018  Check In: Session Check In - 03/19/18 1020      Check-In   Supervising physician immediately available to respond to emergencies  See telemetry face sheet for immediately available ER MD    Location  [facility]-Cardiac & Pulmonary Rehab    Staff Present  Heath Lark, RN, BSN, CCRP;Jeanna Durrell BS, Exercise Physiologist;Elmin Wiederholt [place], MA, RCEP, CCRP, Exercise Physiologist    Medication changes reported      No    Fall or balance concerns reported     No    Warm-up and Cool-down  Performed as group-led Higher education careers adviser Performed  Yes    VAD Patient?  No    PAD/SET Patient?  No      Pain Assessment   Currently in Pain?  No/denies          Social History   Tobacco Use  Smoking Status Former Smoker  . Packs/day: 0.50  . Years: 20.00  . Pack years: 10.00  . Types: Cigarettes  Smokeless Tobacco Never Used  Tobacco Comment   Quit     Goals Met:  Independence with exercise equipment Exercise tolerated well No report of cardiac concerns or symptoms Strength training completed today  Goals Unmet:  Not Applicable  Comments: Pt able to follow exercise prescription today without complaint.  Will continue to monitor for progression.    Dr. Emily Filbert is Medical Director for [facility] and [facility].

## 2018-04-01 ENCOUNTER — Encounter: Payer: Self-pay | Admitting: *Deleted

## 2018-04-01 ENCOUNTER — Telehealth: Payer: Self-pay | Admitting: *Deleted

## 2018-04-01 DIAGNOSIS — I214 Non-ST elevation (NSTEMI) myocardial infarction: Secondary | ICD-10-CM

## 2018-04-01 NOTE — Telephone Encounter (Signed)
[name] called and requested to have his sessions sent to him to send into insurance.  I have printed these off to send out in the mail.

## 2018-04-01 NOTE — Progress Notes (Signed)
Cardiac Individual Treatment Plan  Patient Details  Name: Martin Adkins MRN: [id number] Date of Birth: 04/26/1945 Referring Provider:     Cardiac Rehab from 01/29/2018 in [facility] Cardiac and Pulmonary Rehab  Referring Provider  [name]      Initial Encounter Date:    Cardiac Rehab from 01/29/2018 in [facility] Cardiac and Pulmonary Rehab  Date  01/29/18      Visit Diagnosis: NSTEMI (non-ST elevated myocardial infarction) [facility])  Patient's Home Medications on Admission:  Current Outpatient Medications:  .  albuterol (PROVENTIL HFA;VENTOLIN HFA) 108 (90 Base) MCG/ACT inhaler, Inhale 2 puffs into the lungs every 6 (six) hours as needed for wheezing or shortness of breath., Disp: 1 Inhaler, Rfl: 0 .  amLODipine (NORVASC) 10 MG tablet, Take 10 mg by mouth daily. , Disp: , Rfl:  .  atorvastatin (LIPITOR) 80 MG tablet, Take 80 mg by mouth daily. , Disp: , Rfl: 11 .  Cyanocobalamin 1000 MCG CAPS, Take 1,000 mcg by mouth daily., Disp: 90 capsule, Rfl: 0 .  dorzolamide (TRUSOPT) 2 % ophthalmic solution, INSTILL ONE DROP IN [facility] EYE TWICE DAILY, Disp: , Rfl:  .  fluticasone (FLONASE) 50 MCG/ACT nasal spray, Place 2 sprays into both nostrils daily. , Disp: , Rfl:  .  gabapentin (NEURONTIN) 300 MG capsule, Take 1 capsule (300 mg total) by mouth 3 (three) times daily. (Patient taking differently: Take 300 mg by mouth 2 (two) times daily. ), Disp: 90 capsule, Rfl: 0 .  hydroxychloroquine (PLAQUENIL) 200 MG tablet, Take 200 mg by mouth every other day. , Disp: , Rfl:  .  Ipratropium-Albuterol (COMBIVENT) 20-100 MCG/ACT AERS respimat, Inhale 1 puff into the lungs every 6 (six) hours. (Patient not taking: Reported on 03/16/2018), Disp: 1 Inhaler, Rfl: 11 .  isosorbide mononitrate (IMDUR) 60 MG 24 hr tablet, Take 1 tablet (60 mg total) by mouth daily., Disp: 30 tablet, Rfl: 0 .  latanoprost (XALATAN) 0.005 % ophthalmic solution, INSTILL ONE DROP IN [facility] EYE AT BEDTIME, Disp: , Rfl:  .  metoprolol succinate  (TOPROL-XL) 100 MG 24 hr tablet, Take 100 mg by mouth daily. , Disp: , Rfl:  .  montelukast (SINGULAIR) 10 MG tablet, Take 1 tablet (10 mg total) by mouth as needed. Take before Gazyva infusion., Disp: 20 tablet, Rfl: 0 .  pantoprazole (PROTONIX) 40 MG tablet, Take 40 mg by mouth daily. , Disp: , Rfl:  .  potassium chloride SA (K-DUR,KLOR-CON) 20 MEQ tablet, Take 20 mEq by mouth daily. , Disp: , Rfl:  .  rivaroxaban (XARELTO) 20 MG TABS tablet, Take 1 tablet (20 mg total) by mouth daily with supper., Disp: 30 tablet, Rfl: 3  Past Medical History: Past Medical History:  Diagnosis Date  . AAA (abdominal aortic aneurysm) without rupture ([place]) 04/05/2014  . AAA (abdominal aortic aneurysm) without rupture ([place]) 04/05/2014  . Abdominal aortic aneurysm without rupture ([place])   . Acute non-ST elevation myocardial infarction (NSTEMI) ([place]) 08/15/2014  . 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/28/2014  . APS (antiphospholipid syndrome) ([place])   . Arteriosclerosis of coronary artery 04/05/2014   Overview:  Sp cabg with lima to lad svg to d1 om1 rca 2004 with occluded svg to rca and d1 and pci stetn of om1 graft 2015   . Arthritis   . B12 deficiency 03/21/2017  . Barrett's esophagus   . Benign essential HTN 05/02/2014  . BPH (benign prostatic hyperplasia)   . CAD (coronary artery disease)   . CHF (congestive heart failure) ([place])   . Chronic  systolic heart failure ([place]) 04/19/2014   Overview:  With segmental LV systolic dysfunction ejection fraction of 35%   . CLL (chronic lymphocytic leukemia) ([place])   . CLL (chronic lymphocytic leukemia) ([place]) 05/24/2017  . DVT (deep venous thrombosis) ([place])   . Dysrhythmia    ATRIAL FIB.  Marland Kitchen GERD (gastroesophageal reflux disease)   . History of hiatal hernia   . History of shingles 2013  . Hyperlipemia   . Hyperplastic polyps of stomach   . Hypertension   . Myocardial infarction ([place]) 07/30/2014  . NSTEMI (non-ST elevated myocardial infarction) ([place])   . OSA (obstructive  sleep apnea)   . Osteoarthritis   . Pre-diabetes   . PVD (peripheral vascular disease) ([place])   . RA (rheumatoid arthritis) ([place])   . SOB (shortness of breath)     Tobacco Use: Social History   Tobacco Use  Smoking Status Former Smoker  . Packs/day: 0.50  . Years: 20.00  . Pack years: 10.00  . Types: Cigarettes  Smokeless Tobacco Never Used  Tobacco Comment   Quit     Labs: Recent Review Flowsheet Data    Labs for ITP Cardiac and Pulmonary Rehab Latest Ref Rng & Units 05/03/2013 01/19/2018   Cholestrol 0 - 200 mg/dL 196 116   LDLCALC 0 - 99 mg/dL 121(H) 49   HDL >40 mg/dL 42 51   Trlycerides <150 mg/dL 164 81       Exercise Target Goals: Exercise Program Goal: Individual exercise prescription set using results from initial 6 min walk test and THRR while considering  patient's activity barriers and safety.   Exercise Prescription Goal: Initial exercise prescription builds to 30-45 minutes a day of aerobic activity, 2-3 days per week.  Home exercise guidelines will be given to patient during program as part of exercise prescription that the participant will acknowledge.  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 See MD 1/24 to see next step in care.  SOB with exertion, fatigued. Was receiving Chiroparactic Care until stopped by Cancer MD for low platelet count   Cardiac Risk Stratification  High       6 Minute Walk: 6 Minute Walk    Row Name 01/29/18 1438         6 Minute Walk   Phase  Initial     Distance  1235 feet     Walk Time  6 minutes     # of Rest Breaks  0     MPH  2.34     METS  2.8     RPE  11     Perceived Dyspnea   0     VO2 Peak  9.78     Symptoms  Yes (comment)     Comments  L hip pain due to arthritis 2/10      Resting HR  79 bpm     Resting BP  126/70     Exercise Oxygen Saturation  during 6 min walk  97 %     Max Ex. HR  109 bpm     Max Ex. BP  120/68     2 Minute Post BP  122/70        Oxygen Initial Assessment:   Oxygen Re-Evaluation:   Oxygen Discharge (Final Oxygen Re-Evaluation):  Initial Exercise Prescription: Initial Exercise Prescription - 01/29/18 1400      Date of Initial Exercise RX and Referring Provider   Date  01/29/18    Referring Provider  [name]      Treadmill   MPH  2    Grade  1    Minutes  15    METs  2.8      Recumbant Bike   Level  2    RPM  60    Watts  24    Minutes  15      NuStep   Level  3    SPM  80    Minutes  15    METs  2.8      Prescription Details   Frequency (times per week)  3    Duration  Progress to 30 minutes of continuous aerobic without signs/symptoms of physical distress      Intensity   THRR 40-80% of Max Heartrate  107-136    Ratings of Perceived Exertion  11-15    Perceived Dyspnea  0-4      Resistance Training   Training Prescription  Yes    Weight  3 lb    Reps  10-15       Perform Capillary Blood Glucose checks as needed.  Exercise Prescription Changes: Exercise Prescription Changes    Row Name 01/29/18 1400 02/10/18 1300 02/12/18 0900 02/25/18 0900 03/11/18 0900     Response to Exercise   Blood Pressure (Admit)  126/70  132/64  -  124/68  124/62   Blood Pressure (Exercise)  120/68  144/82  -  126/80  136/80   Blood Pressure (Exit)  122/70  108/70  -  112/78  110/74   Heart Rate (Admit)  79 bpm  91 bpm  -  71 bpm  79 bpm   Heart Rate (Exercise)  109 bpm  115 bpm  -  113 bpm  113 bpm   Heart Rate (Exit)  77 bpm  94 bpm  -  92 bpm  87 bpm   Oxygen Saturation (Exit)  97 %  -  -  -  -   Rating of Perceived Exertion (Exercise)  11  13  -  13  12   Symptoms  -  none  -  none  none   Duration  -  Continue with 30 min of aerobic exercise without signs/symptoms of physical distress.  -  Continue with 30 min of  aerobic exercise without signs/symptoms of physical distress.  Continue with 30 min of aerobic exercise without signs/symptoms of physical distress.   Intensity  -  THRR unchanged  -  THRR unchanged  THRR unchanged     Progression   Progression  -  Continue to progress workloads to maintain intensity without signs/symptoms of physical distress.  -  Continue to progress workloads to maintain intensity without signs/symptoms of physical distress.  Continue to progress workloads to maintain intensity without signs/symptoms of physical distress.   Average METs  -  2.77  -  2.95  2.89     Resistance Training   Training Prescription  -  Yes  -  Yes  Yes   Weight  -  3 lbs  -  4 lbs  4 lbs   Reps  -  10-15  -  10-15  10-15     Interval Training   Interval Training  -  No  -  No  No  Treadmill   MPH  -  2  -  2.2  2.2   Grade  -  1  -  1  1   Minutes  -  15  -  15  15   METs  -  2.81  -  2.99  2.99     Recumbant Bike   Level  -  2  -  -  -   Minutes  -  15  -  -  -   METs  -  2.2  -  -  -     NuStep   Level  -  3  -  4  4   Minutes  -  15  -  15  15   METs  -  3.3  -  2.9  2.8     Home Exercise Plan   Plans to continue exercise at  -  -  [place] 2 additional days to program exercise sessions.  Add 2 additional days to program exercise sessions.  Add 2 additional days to program exercise sessions.   Initial Home Exercises Provided  -  -  02/12/18  02/12/18  02/12/18   Row Name 03/25/18 0900             Response to Exercise   Blood Pressure (Admit)  130/80       Blood Pressure (Exercise)  128/80       Blood Pressure (Exit)  124/78       Heart Rate (Admit)  82 bpm       Heart Rate (Exercise)  115 bpm       Heart Rate (Exit)  81 bpm       Rating of Perceived Exertion (Exercise)  13       Symptoms  none       Duration  Continue with 30 min of aerobic exercise without signs/symptoms of physical  distress.       Intensity  THRR unchanged         Progression   Progression  Continue to progress workloads to maintain intensity without signs/symptoms of physical distress.       Average METs  2.99         Resistance Training   Training Prescription  Yes       Weight  4 lbs       Reps  10-15         Interval Training   Interval Training  No         Treadmill   MPH  2.2       Grade  1       Minutes  15       METs  2.99         NuStep   Level  4       Minutes  15       METs  3         Home Exercise Plan   Plans to continue exercise at  [facility]       Frequency  Add 2 additional days to program exercise sessions.       Initial Home Exercises Provided  02/12/18          Exercise Comments: Exercise Comments    Row Name 02/03/18 [phone] 02/12/18 0915 02/19/18 7121  Exercise Comments  First full day of exercise!  Patient was oriented to gym and equipment including functions, settings, policies, and procedures.  Patient's individual exercise prescription and treatment plan were reviewed.  All starting workloads were established based on the results of the 6 minute walk test done at initial orientation visit.  The plan for exercise progression was also introduced and progression will be customized based on patient's performance and goals.  Reviewed home exercise with pt today.  Pt plans to use the [facility] for exercise.  Reviewed THR, pulse, RPE, sign and symptoms, NTG use, and when to call 911 or MD.  Also discussed weather considerations and indoor options.  Pt voiced understanding.  Martin Adkins has started at the [facility] and reports that he definitely noticed his legs being more tired and athritis flaring more.         Exercise Goals and Review: Exercise Goals    Row Name 01/29/18 1437             Exercise Goals   Increase Physical Activity  Yes       Intervention  Provide advice, education, support and counseling about physical activity/exercise needs.;Develop  an individualized exercise prescription for aerobic and resistive training based on initial evaluation findings, risk stratification, comorbidities and participant's personal goals.       Expected Outcomes  Short Term: Attend rehab on a regular basis to increase amount of physical activity.;Long Term: Add in home exercise to make exercise part of routine and to increase amount of physical activity.;Long Term: Exercising regularly at least 3-5 days a week.       Increase Strength and Stamina  Yes       Intervention  Provide advice, education, support and counseling about physical activity/exercise needs.;Develop an individualized exercise prescription for aerobic and resistive training based on initial evaluation findings, risk stratification, comorbidities and participant's personal goals.       Expected Outcomes  Short Term: Increase workloads from initial exercise prescription for resistance, speed, and METs.;Short Term: Perform resistance training exercises routinely during rehab and add in resistance training at home;Long Term: Improve cardiorespiratory fitness, muscular endurance and strength as measured by increased METs and functional capacity (6MWT)       Able to understand and use rate of perceived exertion (RPE) scale  Yes       Intervention  Provide education and explanation on how to use RPE scale       Expected Outcomes  Short Term: Able to use RPE daily in rehab to express subjective intensity level;Long Term:  Able to use RPE to guide intensity level when exercising independently       Knowledge and understanding of Target Heart Rate Range (THRR)  Yes       Intervention  Provide education and explanation of THRR including how the numbers were predicted and where they are located for reference       Expected Outcomes  Short Term: Able to state/look up THRR;Short Term: Able to use daily as guideline for intensity in rehab;Long Term: Able to use THRR to govern intensity when exercising  independently       Able to check pulse independently  Yes       Intervention  Provide education and demonstration on how to check pulse in carotid and radial arteries.;Review the importance of being able to check your own pulse for safety during independent exercise       Expected Outcomes  Short Term: Able to explain why pulse checking  is important during independent exercise;Long Term: Able to check pulse independently and accurately       Understanding of Exercise Prescription  Yes       Intervention  Provide education, explanation, and written materials on patient's individual exercise prescription       Expected Outcomes  Short Term: Able to explain program exercise prescription;Long Term: Able to explain home exercise prescription to exercise independently          Exercise Goals Re-Evaluation : Exercise Goals Re-Evaluation    Row Name 02/03/18 0757 02/10/18 1300 02/19/18 0935 02/25/18 0859 03/19/18 1251     Exercise Goal Re-Evaluation   Exercise Goals Review  Increase Physical Activity;Able to understand and use rate of perceived exertion (RPE) scale;Knowledge and understanding of Target Heart Rate Range (THRR);Increase Strength and Stamina;Understanding of Exercise Prescription  Increase Physical Activity;Increase Strength and Stamina;Understanding of Exercise Prescription  Increase Physical Activity;Able to understand and use rate of perceived exertion (RPE) scale;Knowledge and understanding of Target Heart Rate Range (THRR);Understanding of Exercise Prescription;Increase Strength and Stamina;Able to check pulse independently  Increase Physical Activity;Increase Strength and Stamina;Understanding of Exercise Prescription  Increase Physical Activity;Increase Strength and Stamina;Understanding of Exercise Prescription   Comments  Reviewed RPE scale, THR and program prescription with pt today.  Pt voiced understanding and was given a copy of goals to take home.   Martin Adkins is off to a good start in  rehab.  He has completed three full days of exercise so far.  We will be going over his home exercise guidelines soon.  We will continue to monitor his progress.   Martin Adkins has noticed that he is able to do more at home and do it more efficiently. He has to go up and down stairs to do laundry and has found that to be easier since starting cardiac rehab.   Martin Adkins continues to do well in rehab.  He is up to level 4 on the NuStep.  We will continue to monitor his progress.   Martin Adkins has been doing well in rehab.  He was out on [place] due to back and hip pain.  This had been his biggest inhibitor. It keeps him from doing as much as he wants at home and limits his exercise.  Overall, he is doing better and it has started to ease.  He does report feeling stonger and has more stamina.    Expected Outcomes  Short: Use RPE daily to regulate intensity. Long: Follow program prescription in THR.  Short: Review home exercies guidelines.  Long: Continue to increase strength and stamina  Short: He has started exercising at the [facility] and will work up to 3 days per week. Long: Continue exercising and building strength after graduating.  Short: Increase workload on treadmill.  Long: Continue to exercise on off days at [place]: Walk more at home on off days.  Long: Continue to increase strength and stamina.    [place] Name 04/01/18 1225             Exercise Goal Re-Evaluation   Exercise Goals Review  Increase Physical Activity;Increase Strength and Stamina;Understanding of Exercise Prescription       Comments  Spoke with Martin Adkins on the phone today.  Martin Adkins has not been doing as much as he should. He is doing what he can at home. He misses coming into to class.        Expected Outcomes  Short: Walk more at home.  Long: Continue to maintain strength and stamina.  Discharge Exercise Prescription (Final Exercise Prescription Changes): Exercise Prescription Changes - 03/25/18 0900      Response to Exercise   Blood Pressure  (Admit)  130/80    Blood Pressure (Exercise)  128/80    Blood Pressure (Exit)  124/78    Heart Rate (Admit)  82 bpm    Heart Rate (Exercise)  115 bpm    Heart Rate (Exit)  81 bpm    Rating of Perceived Exertion (Exercise)  13    Symptoms  none    Duration  Continue with 30 min of aerobic exercise without signs/symptoms of physical distress.    Intensity  THRR unchanged      Progression   Progression  Continue to progress workloads to maintain intensity without signs/symptoms of physical distress.    Average METs  2.99      Resistance Training   Training Prescription  Yes    Weight  4 lbs    Reps  10-15      Interval Training   Interval Training  No      Treadmill   MPH  2.2    Grade  1    Minutes  15    METs  2.99      NuStep   Level  4    Minutes  15    METs  3      Home Exercise Plan   Plans to continue exercise at  [facility]    Frequency  Add 2 additional days to program exercise sessions.    Initial Home Exercises Provided  02/12/18       Nutrition:  Target Goals: Understanding of nutrition guidelines, daily intake of sodium <1592m, cholesterol <2058m calories 30% from fat and 7% or less from saturated fats, daily to have 5 or more servings of fruits and vegetables.  Biometrics: Pre Biometrics - 01/29/18 1437      Pre Biometrics   Height  5' 11.5" (1.816 m)    Weight  212 lb (96.2 kg)    Waist Circumference  42 inches    Hip Circumference  40 inches    Waist to Hip Ratio  1.05 %    BMI (Calculated)  29.16    Single Leg Stand  5.48 seconds        Nutrition Therapy Plan and Nutrition Goals: Nutrition Therapy & Goals - 03/30/18 1012      Nutrition Therapy   Diet  Heart Healthy Low Sodium Diet 1750-1950kcal (wst: 42 inches!; ref<40inches)(BMI: 29.46)    Protein (specify units)  75-80g    Fiber  30 grams    Whole Grain Foods  3 servings    Saturated Fats  12 max. grams    Fruits and Vegetables  5 servings/day    Sodium  1.5 grams       Personal Nutrition Goals   Nutrition Goal  ST: increase protein by 1/2-1 serving/ day LT: feel better and gain more energy    Comments  Vegetables 3 servings per day, not making much progress with increasing fruit intake; told him that increasing either fruit or vegetable intake would be fine, if increasing his vegetable intake is easier he should do that. He notes that he has a little bit of oatmeal but not much, and will choose whole wheat bread, but doesn't eat many whole grains. Pt reports normally not eating much; boiled eggw/ slice of toast, chicken w/ green beans, and then a peanut butter cracker for dinner.  Intervention Plan   Intervention  Prescribe, educate and counsel regarding individualized specific dietary modifications aiming towards targeted core components such as weight, hypertension, lipid management, diabetes, heart failure and other comorbidities.    Expected Outcomes  Short Term Goal: Understand basic principles of dietary content, such as calories, fat, sodium, cholesterol and nutrients.;Short Term Goal: A plan has been developed with personal nutrition goals set during dietitian appointment.;Long Term Goal: Adherence to prescribed nutrition plan.       Nutrition Assessments: Nutrition Assessments - 01/29/18 1446      MEDFICTS Scores   Pre Score  53       Nutrition Goals Re-Evaluation: Nutrition Goals Re-Evaluation    [place] Name 02/19/18 1134 03/19/18 1258           Goals   Current Weight  214 lb (97.1 kg)  -      Nutrition Goal  Martin Adkins eats plenty of frozen vegetables, some fruits, poultry, and fish. Martin Adkins uses a No Salt (substitute for Salt).  Heart Healthy diet      Comment  Encouraged Martin Adkins to bring any nutrition questions he has to class or come by after graduation within new questions. He sees a registered dietitain at the [facility] regularly.  Martin Adkins continues to eat lots of vegetables and a few fruits. He sticks to his whole grains and lean meats. Overall, he  feels that he has a good grasp on his diet.      Expected Outcome  Short: Continue eating a heart healthy diet. Long: Eat plenty of fruits and vegetables. Consult with Dietitian here or at the [facility]. Continue using salt substitute.  Short: Try more fruits for variety  Long: Continue to follow healthy eating plan         Nutrition Goals Discharge (Final Nutrition Goals Re-Evaluation): Nutrition Goals Re-Evaluation - 03/19/18 1258      Goals   Nutrition Goal  Heart Healthy diet    Comment  Martin Adkins continues to eat lots of vegetables and a few fruits. He sticks to his whole grains and lean meats. Overall, he feels that he has a good grasp on his diet.    Expected Outcome  Short: Try more fruits for variety  Long: Continue to follow healthy eating plan       Psychosocial: Target Goals: Acknowledge presence or absence of significant depression and/or stress, maximize coping skills, provide positive support system. Participant is able to verbalize types and ability to use techniques and skills needed for reducing stress and depression.   Initial Review & Psychosocial Screening: Initial Psych Review & Screening - 02/19/18 0946      Initial Review   Current issues with  --    Source of Stress Concerns  Chronic Illness    Comments  Error: Notes should be in Re-Eval.       Family Dynamics   Good Support System?  Yes       Quality of Life Scores:  Quality of Life - 01/29/18 1443      Quality of Life Scores   Health/Function Pre  16.17 %    Socioeconomic Pre  19.64 %    Psych/Spiritual Pre  26 %    Family Pre  4.5 %    GLOBAL Pre  16.92 %      Scores of 19 and below usually indicate a poorer quality of life in these areas.  A difference of  2-3 points is a clinically meaningful difference.  A difference of 2-3  points in the total score of the Quality of Life Index has been associated with significant improvement in overall quality of life, self-image, physical symptoms, and general  health in studies assessing change in quality of life.  PHQ-9: Recent Review Flowsheet Data    Depression screen [facility] 2/9 01/29/2018 01/29/2018 11/16/2014 09/26/2014   Decreased Interest - 0 2 1   Down, Depressed, Hopeless 2 0 0 0   PHQ - 2 Score 2 0 2 1   Altered sleeping - 0 0 1   Tired, decreased energy 3 0 2 1   Change in appetite - 0 0 0   Feeling bad or failure about yourself  - 0 0 0   Trouble concentrating - 0 - 0   Moving slowly or fidgety/restless - 0 0 0   Suicidal thoughts - 0 0 0   PHQ-9 Score - 0 4 3   Difficult doing work/chores Somewhat difficult - Somewhat difficult Somewhat difficult     Interpretation of Total Score  Total Score Depression Severity:  1-4 = Minimal depression, 5-9 = Mild depression, 10-14 = Moderate depression, 15-19 = Moderately severe depression, 20-27 = Severe depression   Psychosocial Evaluation and Intervention: Psychosocial Evaluation - 02/03/18 0906      Psychosocial Evaluation & Interventions   Interventions  Encouraged to exercise with the program and follow exercise prescription    Comments  Mr. Cornwall (Martin Adkins) returned to this program after several years due to a recent "mild heart attack," which is being treated with Rx, Exercise and diet.  He is a 73 year old who has a good support system with a daughter, son and brother who live locally.  He is in good health mostly except his arthritis and his heart condition.  Martin Adkins reports sleeping well and has a good appetite.  He denies a history of depression or anxiety and reports typically being in a positive mood.  Other than his health, he states nothing else is stressful for him currently.  Martin Adkins has goals to increase his stamina and strength and be able to maintain this with consistent exercise.  He plans to return to [employer] here at [facility] gym following completion of this program.  Staff will follow with Martin Adkins.    Expected Outcomes  Short:  Martin Adkins will exercise consistently to regain his stamina and  strength and be more heart healthy.  Long:  Martin Adkins will maintain his exercise routine for his overall health.      Continue Psychosocial Services   Follow up required by staff       Psychosocial Re-Evaluation: Psychosocial Re-Evaluation    [place] Name 02/19/18 1125 03/19/18 1255           Psychosocial Re-Evaluation   Current issues with  Current Sleep Concerns;Current Stress Concerns  Current Sleep Concerns;Current Stress Concerns      Comments  Martin Adkins reports sleeping well, average of 8 hours per night. When he does wake up to go to the bathroom, he is able to go right back to sleep. He is stressed by his current health concerns but is being proactive in managing his controllable factors.  Martin Adkins has been doing well in rehab. His back and hip pain have limited his ability to do as much as he would like, but this is getting better.  He sleeps well and dreams a lot.  He always feels better after his exercise days.  He tries to not let things get to him too much.  Expected Outcomes  Continue to use stress management tools, encouraged to use relaxation, and communicate needs to health care providers.  Short: Continue to work through hip and back pain.  Long: Continue to stay positive.       Interventions  -  Encouraged to attend Cardiac Rehabilitation for the exercise      Continue Psychosocial Services   -  Follow up required by staff         Psychosocial Discharge (Final Psychosocial Re-Evaluation): Psychosocial Re-Evaluation - 03/19/18 1255      Psychosocial Re-Evaluation   Current issues with  Current Sleep Concerns;Current Stress Concerns    Comments  Martin Adkins has been doing well in rehab. His back and hip pain have limited his ability to do as much as he would like, but this is getting better.  He sleeps well and dreams a lot.  He always feels better after his exercise days.  He tries to not let things get to him too much.     Expected Outcomes  Short: Continue to work through hip and back pain.   Long: Continue to stay positive.     Interventions  Encouraged to attend Cardiac Rehabilitation for the exercise    Continue Psychosocial Services   Follow up required by staff       Vocational Rehabilitation: Provide vocational rehab assistance to qualifying candidates.   Vocational Rehab Evaluation & Intervention: Vocational Rehab - 01/29/18 1446      Initial Vocational Rehab Evaluation & Intervention   Assessment shows need for Vocational Rehabilitation  No       Education: Education Goals: Education classes will be provided on a variety of topics geared toward better understanding of heart health and risk factor modification. Participant will state understanding/return demonstration of topics presented as noted by education test scores.  Learning Barriers/Preferences: Learning Barriers/Preferences - 01/29/18 1446      Learning Barriers/Preferences   Learning Barriers  None    Learning Preferences  None       Education Topics:  AED/CPR: - Group verbal and written instruction with the use of models to demonstrate the basic use of the AED with the basic ABC's of resuscitation.   General Nutrition Guidelines/Fats and Fiber: -Group instruction provided by verbal, written material, models and posters to present the general guidelines for heart healthy nutrition. Gives an explanation and review of dietary fats and fiber.   Cardiac Rehab from 11/21/2014 in [facility] Cardiac and Pulmonary Rehab  Date  10/17/14  Educator  PI  Instruction Review Code (retired)  2- meets goals/outcomes      Controlling Sodium/Reading Food Labels: -Group verbal and written material supporting the discussion of sodium use in heart healthy nutrition. Review and explanation with models, verbal and written materials for utilization of the food label.   Cardiac Rehab from 03/19/2018 in [facility] Cardiac and Pulmonary Rehab  Date  03/19/18  Educator  [facility]  Instruction Review Code  5- Refused Teaching       Exercise Physiology & General Exercise Guidelines: - Group verbal and written instruction with models to review the exercise physiology of the cardiovascular system and associated critical values. Provides general exercise guidelines with specific guidelines to those with heart or lung disease.    Cardiac Rehab from 11/21/2014 in [facility] Cardiac and Pulmonary Rehab  Date  11/09/14  Educator  RM  Instruction Review Code (retired)  2- meets goals/outcomes      Aerobic Exercise & Resistance Training: - Gives group verbal and written  instruction on the various components of exercise. Focuses on aerobic and resistive training programs and the benefits of this training and how to safely progress through these programs..   Cardiac Rehab from 11/21/2014 in [facility] Cardiac and Pulmonary Rehab  Date  11/14/14  Educator  RM  Instruction Review Code (retired)  2- Statistician, Balance, Mind/Body Relaxation: Provides group verbal/written instruction on the benefits of flexibility and balance training, including mind/body exercise modes such as yoga, pilates and tai chi.  Demonstration and skill practice provided.   Cardiac Rehab from 03/19/2018 in [facility] Cardiac and Pulmonary Rehab  Date  02/03/18  Educator  AS  Instruction Review Code  1- Verbalizes Understanding      Stress and Anxiety: - Provides group verbal and written instruction about the health risks of elevated stress and causes of high stress.  Discuss the correlation between heart/lung disease and anxiety and treatment options. Review healthy ways to manage with stress and anxiety.   Cardiac Rehab from 03/19/2018 in [facility] Cardiac and Pulmonary Rehab  Date  02/24/18  Educator  [facility]  Instruction Review Code  5- Refused Teaching      Depression: - Provides group verbal and written instruction on the correlation between heart/lung disease and depressed mood, treatment options, and the stigmas associated with seeking  treatment.   Cardiac Rehab from 03/19/2018 in [facility] Cardiac and Pulmonary Rehab  Date  03/10/18  Educator  [facility]  Instruction Review Code  5- Refused Teaching      Anatomy & Physiology of the Heart: - Group verbal and written instruction and models provide basic cardiac anatomy and physiology, with the coronary electrical and arterial systems. Review of Valvular disease and Heart Failure   Cardiac Rehab from 03/19/2018 in [facility] Cardiac and Pulmonary Rehab  Date  02/12/18  Educator  CE  Instruction Review Code  5- Refused Teaching      Cardiac Procedures: - Group verbal and written instruction to review commonly prescribed medications for heart disease. Reviews the medication, class of the drug, and side effects. Includes the steps to properly store meds and maintain the prescription regimen. (beta blockers and nitrates)   Cardiac Rehab from 03/19/2018 in [facility] Cardiac and Pulmonary Rehab  Date  02/26/18  Educator  CE  Instruction Review Code  5- Refused Teaching      Cardiac Medications I: - Group verbal and written instruction to review commonly prescribed medications for heart disease. Reviews the medication, class of the drug, and side effects. Includes the steps to properly store meds and maintain the prescription regimen.   Cardiac Rehab from 03/19/2018 in [facility] Cardiac and Pulmonary Rehab  Date  02/17/18  Educator  SB  Instruction Review Code  1- Verbalizes Understanding      Cardiac Medications II: -Group verbal and written instruction to review commonly prescribed medications for heart disease. Reviews the medication, class of the drug, and side effects. (all other drug classes)   Cardiac Rehab from 03/19/2018 in [facility] Cardiac and Pulmonary Rehab  Date  02/05/18  Educator  CE  Instruction Review Code  1- Verbalizes Understanding       Go Sex-Intimacy & Heart Disease, Get SMART - Goal Setting: - Group verbal and written instruction through game format to discuss heart disease  and the return to sexual intimacy. Provides group verbal and written material to discuss and apply goal setting through the application of the S.M.A.R.T. Method.   Cardiac Rehab from 03/19/2018 in [facility] Cardiac  and Pulmonary Rehab  Date  02/26/18  Educator  CE  Instruction Review Code  5- Refused Teaching      Other Matters of the Heart: - Provides group verbal, written materials and models to describe Stable Angina and Peripheral Artery. Includes description of the disease process and treatment options available to the cardiac patient.   Cardiac Rehab from 03/19/2018 in [facility] Cardiac and Pulmonary Rehab  Date  02/12/18  Educator  CE  Instruction Review Code  1- Verbalizes Understanding      Exercise & Equipment Safety: - Individual verbal instruction and demonstration of equipment use and safety with use of the equipment.   Cardiac Rehab from 03/19/2018 in [facility] Cardiac and Pulmonary Rehab  Date  01/29/18  Educator  sb  Instruction Review Code  1- Verbalizes Understanding      Infection Prevention: - Provides verbal and written material to individual with discussion of infection control including proper hand washing and proper equipment cleaning during exercise session.   Cardiac Rehab from 03/19/2018 in [facility] Cardiac and Pulmonary Rehab  Date  01/29/18  Educator  SB1  Instruction Review Code  1- Verbalizes Understanding      Falls Prevention: - Provides verbal and written material to individual with discussion of falls prevention and safety.   Cardiac Rehab from 03/19/2018 in [facility] Cardiac and Pulmonary Rehab  Date  01/29/18  Educator  sb  Instruction Review Code  1- Verbalizes Understanding      Diabetes: - Individual verbal and written instruction to review signs/symptoms of diabetes, desired ranges of glucose level fasting, after meals and with exercise. Acknowledge that pre and post exercise glucose checks will be done for 3 sessions at entry of program.   Know Your  Numbers and Risk Factors: -Group verbal and written instruction about important numbers in your health.  Discussion of what are risk factors and how they play a role in the disease process.  Review of Cholesterol, Blood Pressure, Diabetes, and BMI and the role they play in your overall health.   Cardiac Rehab from 03/19/2018 in [facility] Cardiac and Pulmonary Rehab  Date  02/05/18  Educator  CE  Instruction Review Code  1- Verbalizes Understanding      Sleep Hygiene: -Provides group verbal and written instruction about how sleep can affect your health.  Define sleep hygiene, discuss sleep cycles and impact of sleep habits. Review good sleep hygiene tips.    Cardiac Rehab from 03/19/2018 in [facility] Cardiac and Pulmonary Rehab  Date  02/10/18  Educator  CE  Instruction Review Code  5- Refused Teaching      Other: -Provides group and verbal instruction on various topics (see comments)   Knowledge Questionnaire Score: Knowledge Questionnaire Score - 01/29/18 1446      Knowledge Questionnaire Score   Pre Score  21/26   Reviewed correct responses with Martin Adkins today. He verbalized understanding and had no further questions today      Core Components/Risk Factors/Patient Goals at Admission: Personal Goals and Risk Factors at Admission - 01/29/18 1447      Core Components/Risk Factors/Patient Goals on Admission   Tobacco Cessation  Yes    Number of packs per day  Quit 2 weeks ago Jan 2020    Intervention  Assist the participant in steps to quit. Provide individualized education and counseling about committing to Tobacco Cessation, relapse prevention, and pharmacological support that can be provided by physician.;Advice worker, assist with locating and accessing local/national Quit Smoking programs, and support  quit date choice.    Expected Outcomes  Short Term: Will demonstrate readiness to quit, by selecting a quit date.;Long Term: Complete abstinence from all tobacco products for at  least 12 months from quit date.;Short Term: Will quit all tobacco product use, adhering to prevention of relapse plan.    Heart Failure  Yes    Intervention  Provide a combined exercise and nutrition program that is supplemented with education, support and counseling about heart failure. Directed toward relieving symptoms such as shortness of breath, decreased exercise tolerance, and extremity edema.    Expected Outcomes  Improve functional capacity of life;Short term: Attendance in program 2-3 days a week with increased exercise capacity. Reported lower sodium intake. Reported increased fruit and vegetable intake. Reports medication compliance.;Short term: Daily weights obtained and reported for increase. Utilizing diuretic protocols set by physician.;Long term: Adoption of self-care skills and reduction of barriers for early signs and symptoms recognition and intervention leading to self-care maintenance.   Spoke to [employer] about daily weights and reporting 2-3 pound increase to MD. Martin Adkins has stopped his LAsix "I had to go all the time" Advised Jope to let his MD know he has stopped the LAsix and to check on the potassuim he is prescribed.;   Hypertension  Yes    Intervention  Provide education on lifestyle modifcations including regular physical activity/exercise, weight management, moderate sodium restriction and increased consumption of fresh fruit, vegetables, and low fat dairy, alcohol moderation, and smoking cessation.;Monitor prescription use compliance.    Expected Outcomes  Short Term: Continued assessment and intervention until BP is < 140/74m HG in hypertensive participants. < 130/832mHG in hypertensive participants with diabetes, heart failure or chronic kidney disease.;Long Term: Maintenance of blood pressure at goal levels.    Lipids  Yes    Intervention  Provide education and support for participant on nutrition & aerobic/resistive exercise along with prescribed medications to achieve  LDL <7097mHDL >97m66m  Expected Outcomes  Short Term: Participant states understanding of desired cholesterol values and is compliant with medications prescribed. Participant is following exercise prescription and nutrition guidelines.;Long Term: Cholesterol controlled with medications as prescribed, with individualized exercise RX and with personalized nutrition plan. Value goals: LDL < 70mg79mL > 40 mg.       Core Components/Risk Factors/Patient Goals Review:  Goals and Risk Factor Review    Row Name 02/19/18 1143 03/19/18 1259           Core Components/Risk Factors/Patient Goals Review   Personal Goals Review  Weight Management/Obesity;Lipids;Hypertension;Heart Failure;Tobacco Cessation;Other  Weight Management/Obesity;Lipids;Hypertension;Heart Failure;Tobacco Cessation;Other      Review  Weight is staying the same. Martin Adkins iWille Glaserkay with that for now. He takes his blood pressure and cholesterol medications as prescribed. He checks his blood pressure at home and gets an average reading of 130/80. He has his bloodwork done regularly at [facility] bc they are watching his platelets. He was on Chemo until his platelets dropped too low and will start chemo again once they return to a normal level. He is smoking 3 cigarettes. He is going to ask his doctor for the patch to quit. He checks his weight and ankles daily for heart failure symptoms.  Martin Adkins's weight continues to stay between 208-210 lbs.  He contnues to check his pressures daily as well.  He denies any symptoms of heart failure.  He continues to smoke currently and is working towards his goal to quit.  Expected Outcomes  Short: Take medications as prescribed, check weight, BP, and decrease tobacco use. Long: Quit smoking with help from the patches. Take meds as prescribed. Keep a check of his numbers from bloodwork for lipids.   Short: Continue to monitor heart failure symptoms  Long: Continue to monitor risk factors.          Core  Components/Risk Factors/Patient Goals at Discharge (Final Review):  Goals and Risk Factor Review - 03/19/18 1259      Core Components/Risk Factors/Patient Goals Review   Personal Goals Review  Weight Management/Obesity;Lipids;Hypertension;Heart Failure;Tobacco Cessation;Other    Review  Martin Adkins's weight continues to stay between 208-210 lbs.  He contnues to check his pressures daily as well.  He denies any symptoms of heart failure.  He continues to smoke currently and is working towards his goal to quit.     Expected Outcomes  Short: Continue to monitor heart failure symptoms  Long: Continue to monitor risk factors.        ITP Comments: ITP Comments    Row Name 01/29/18 1424 02/04/18 0935 03/04/18 0541 03/25/18 0932 04/01/18 1225   ITP Comments  Medical review completed . ITP sent to Dr Loleta Chance for review, changes as needed and signature. Documentation of diagnosis can be found in [facility] 01/18/2018  30 Day Review. Continue with ITP unless directed changes per Medical Director review.  30 Day Review. Continue with ITP unless directed changes per Medical Director review.  Our program is currently closed due to COVID-19.  We are communicating with patient via phone calls and emails.   Martin Adkins called and requested to have his sessions sent to him to send into insurance.  I have printed these off to send out in the mail.     [place] Name 04/01/18 1348           ITP Comments  30 day review completed. Continue with ITP unless directed changes by Medical Director at review          Comments:

## 2018-04-13 ENCOUNTER — Other Ambulatory Visit: Payer: Medicare PPO

## 2018-04-13 ENCOUNTER — Ambulatory Visit: Payer: Medicare PPO | Admitting: Oncology

## 2018-04-13 ENCOUNTER — Inpatient Hospital Stay: Payer: Medicare PPO | Admitting: Oncology

## 2018-04-13 ENCOUNTER — Inpatient Hospital Stay: Payer: Medicare PPO | Attending: Oncology

## 2018-04-13 ENCOUNTER — Other Ambulatory Visit: Payer: Self-pay

## 2018-04-13 DIAGNOSIS — D696 Thrombocytopenia, unspecified: Secondary | ICD-10-CM

## 2018-04-13 DIAGNOSIS — C9190 Lymphoid leukemia, unspecified not having achieved remission: Secondary | ICD-10-CM | POA: Diagnosis not present

## 2018-04-13 LAB — CBC WITH DIFFERENTIAL/PLATELET
Abs Immature Granulocytes: 0.01 10*3/uL (ref 0.00–0.07)
Basophils Absolute: 0 10*3/uL (ref 0.0–0.1)
Basophils Relative: 1 %
Eosinophils Absolute: 0 10*3/uL (ref 0.0–0.5)
Eosinophils Relative: 1 %
HCT: 42.8 % (ref 39.0–52.0)
Hemoglobin: 14 g/dL (ref 13.0–17.0)
Immature Granulocytes: 0 %
Lymphocytes Relative: 19 %
Lymphs Abs: 0.8 10*3/uL (ref 0.7–4.0)
MCH: 30 pg (ref 26.0–34.0)
MCHC: 32.7 g/dL (ref 30.0–36.0)
MCV: 91.8 fL (ref 80.0–100.0)
Monocytes Absolute: 0.4 10*3/uL (ref 0.1–1.0)
Monocytes Relative: 10 %
Neutro Abs: 2.8 10*3/uL (ref 1.7–7.7)
Neutrophils Relative %: 69 %
Platelets: 72 10*3/uL — ABNORMAL LOW (ref 150–400)
RBC: 4.66 MIL/uL (ref 4.22–5.81)
RDW: 14.5 % (ref 11.5–15.5)
WBC: 4 10*3/uL (ref 4.0–10.5)
nRBC: 0 % (ref 0.0–0.2)

## 2018-04-14 ENCOUNTER — Other Ambulatory Visit: Payer: Self-pay

## 2018-04-14 ENCOUNTER — Inpatient Hospital Stay (HOSPITAL_BASED_OUTPATIENT_CLINIC_OR_DEPARTMENT_OTHER): Payer: Medicare PPO | Admitting: Oncology

## 2018-04-14 ENCOUNTER — Encounter: Payer: Self-pay | Admitting: Oncology

## 2018-04-14 ENCOUNTER — Inpatient Hospital Stay: Payer: Medicare PPO | Admitting: Oncology

## 2018-04-14 DIAGNOSIS — C9110 Chronic lymphocytic leukemia of B-cell type not having achieved remission: Secondary | ICD-10-CM | POA: Diagnosis not present

## 2018-04-14 DIAGNOSIS — Z7901 Long term (current) use of anticoagulants: Secondary | ICD-10-CM

## 2018-04-14 DIAGNOSIS — D696 Thrombocytopenia, unspecified: Secondary | ICD-10-CM

## 2018-04-14 NOTE — Progress Notes (Signed)
Called patient for WebEx visit.  Patient states no new concerns today.   

## 2018-04-14 NOTE — Progress Notes (Signed)
HEMATOLOGY-ONCOLOGY TeleHEALTH VISIT PROGRESS NOTE  I connected with [name] on 04/14/18 at  9:45 AM EDT by telephone and verified that I am speaking with the correct person using two identifiers.  I discussed the limitations, risks, security and privacy concerns of performing an evaluation and management service by telemedicine and the availability of in-person appointments. I also discussed with the patient that there may be a patient responsible charge related to this service. The patient expressed understanding and agreed to proceed.   Other persons participating in the visit and their role in the encounter: Geraldine Solar, CMA, checking in patient.   Patient's location: home Provider's location: Clinic  Chief Complaint:   INTERVAL HISTORY Martin Adkins is a 73 y.o. male who has above history reviewed by me today presents for Webex visit for management of CLL, thrombocytopenia.  Problems and complaints are listed below:  Patient was seen by me 4 weeks ago.  At that time, he had an episode of bronchitis and was treated. His thrombocytopenia got worse and he was also advised to stop drinking alcohol.  Patient informs me that he has stopped drinking alcohol. Reports doing fine since last visit. Denies hematochezia, hematuria, hematemesis, epistaxis, black tarry stool or easy bruising.  Patient is on Xarelto 20 mg daily for atrial fibrillation. Chronic fatigue at baseline. Denies any night sweating, fever, chills, unintentional weight loss.  Review of Systems  Constitutional: Positive for fatigue. Negative for appetite change, chills, fever and unexpected weight change.  HENT:   Negative for hearing loss and voice change.   Eyes: Negative for eye problems and icterus.  Respiratory: Negative for chest tightness, cough and shortness of breath.   Cardiovascular: Negative for chest pain and leg swelling.  Gastrointestinal: Negative for abdominal distention and abdominal pain.   Endocrine: Negative for hot flashes.  Genitourinary: Negative for difficulty urinating, dysuria and frequency.   Musculoskeletal: Negative for arthralgias.  Skin: Negative for itching and rash.  Neurological: Negative for light-headedness and numbness.  Hematological: Negative for adenopathy. Does not bruise/bleed easily.  Psychiatric/Behavioral: Negative for confusion.    Past Medical History:  Diagnosis Date  . AAA (abdominal aortic aneurysm) without rupture ([place]) 04/05/2014  . AAA (abdominal aortic aneurysm) without rupture ([place]) 04/05/2014  . Abdominal aortic aneurysm without rupture ([place])   . Acute non-ST elevation myocardial infarction (NSTEMI) ([place]) 08/15/2014  . 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/28/2014  . APS (antiphospholipid syndrome) ([place])   . Arteriosclerosis of coronary artery 04/05/2014   Overview:  Sp cabg with lima to lad svg to d1 om1 rca 2004 with occluded svg to rca and d1 and pci stetn of om1 graft 2015   . Arthritis   . B12 deficiency 03/21/2017  . Barrett's esophagus   . Benign essential HTN 05/02/2014  . BPH (benign prostatic hyperplasia)   . CAD (coronary artery disease)   . CHF (congestive heart failure) ([place])   . Chronic systolic heart failure ([place]) 04/19/2014   Overview:  With segmental LV systolic dysfunction ejection fraction of 35%   . CLL (chronic lymphocytic leukemia) ([place])   . CLL (chronic lymphocytic leukemia) ([place]) 05/24/2017  . DVT (deep venous thrombosis) ([place])   . Dysrhythmia    ATRIAL FIB.  Marland Kitchen GERD (gastroesophageal reflux disease)   . History of hiatal hernia   . History of shingles 2013  . Hyperlipemia   . Hyperplastic polyps of stomach   . Hypertension   . Myocardial infarction ([place]) 07/30/2014  . NSTEMI (non-ST elevated myocardial infarction) (  [place])   . OSA (obstructive sleep apnea)   . Osteoarthritis   . Pre-diabetes   . PVD (peripheral vascular disease) ([place])   . RA (rheumatoid arthritis) ([place])   . SOB (shortness of breath)    Past  Surgical History:  Procedure Laterality Date  . ABDOMINAL AORTA STENT    . CHOLECYSTECTOMY    . COLONOSCOPY  11/24/2009  . CORONARY ANGIOPLASTY WITH STENT PLACEMENT  2015  . CORONARY ARTERY BYPASS GRAFT    . ENDOVASCULAR REPAIR/STENT GRAFT N/A 09/24/2017   Procedure: ENDOVASCULAR REPAIR/STENT GRAFT;  Surgeon: Algernon Huxley, MD;  Location: [facility];  Service: Cardiovascular;  Laterality: N/A;  . ESOPHAGOGASTRODUODENOSCOPY  05/26/02  03/23/12  . ESOPHAGOGASTRODUODENOSCOPY (EGD) WITH PROPOFOL N/A 10/28/2016   Procedure: ESOPHAGOGASTRODUODENOSCOPY (EGD) WITH PROPOFOL;  Surgeon: Manya Silvas, MD;  Location: [facility];  Service: Endoscopy;  Laterality: N/A;  . LEFT HEART CATH AND CORONARY ANGIOGRAPHY N/A 01/19/2018   Procedure: LEFT HEART CATH AND CORONARY ANGIOGRAPHY;  Surgeon: Teodoro Spray, MD;  Location: [place];  Service: Cardiovascular;  Laterality: N/A;  . PERIPHERAL VASCULAR CATHETERIZATION N/A 09/06/2014   Procedure: IVC Filter Removal;  Surgeon: Katha Cabal, MD;  Location: [place] [facility];  Service: Cardiovascular;  Laterality: N/A;  . TRANSURETHRAL RESECTION OF PROSTATE N/A 02/20/2015   Procedure: TRANSURETHRAL RESECTION OF THE PROSTATE (TURP);  Surgeon: Hollice Espy, MD;  Location: [facility];  Service: Urology;  Laterality: N/A;    Family History  Problem Relation Age of Onset  . Cancer Mother 63       breast ca  . Diabetes Mother   . Coronary artery disease Father   . Heart attack Father   . Prostate cancer Brother   . Bladder Cancer Neg Hx   . Kidney cancer Neg Hx     Social History   Socioeconomic History  . Marital status: Legally Separated    Spouse name: Not on file  . Number of children: Not on file  . Years of education: Not on file  . Highest education level: Not on file  Occupational History  . Not on file  Social Needs  . Financial resource strain: Not hard at all  . Food insecurity:    Worry: Never true     Inability: Never true  . Transportation needs:    Medical: No    Non-medical: No  Tobacco Use  . Smoking status: Former Smoker    Packs/day: 0.50    Years: 20.00    Pack years: 10.00    Types: Cigarettes  . Smokeless tobacco: Never Used  . Tobacco comment: Quit   Substance and Sexual Activity  . Alcohol use: Yes    Alcohol/week: 1.0 - 2.0 standard drinks    Types: 1 - 2 Shots of liquor per week    Comment: social on weekends  . Drug use: No  . Sexual activity: Never  Lifestyle  . Physical activity:    Days per week: 7 days    Minutes per session: 40 min  . Stress: Not at all  Relationships  . Social connections:    Talks on phone: More than three times a week    Gets together: More than three times a week    Attends religious service: More than 4 times per year    Active member of club or organization: No    Attends meetings of clubs or organizations: Never    Relationship status: Separated  . Intimate  partner violence:    Fear of current or ex partner: No    Emotionally abused: No    Physically abused: No    Forced sexual activity: No  Other Topics Concern  . Not on file  Social History Narrative  . Not on file    Current Outpatient Medications on File Prior to Visit  Medication Sig Dispense Refill  . albuterol (PROVENTIL HFA;VENTOLIN HFA) 108 (90 Base) MCG/ACT inhaler Inhale 2 puffs into the lungs every 6 (six) hours as needed for wheezing or shortness of breath. 1 Inhaler 0  . amLODipine (NORVASC) 10 MG tablet Take 10 mg by mouth daily.     Marland Kitchen atorvastatin (LIPITOR) 80 MG tablet Take 80 mg by mouth daily.   11  . Cyanocobalamin 1000 MCG CAPS Take 1,000 mcg by mouth daily. 90 capsule 0  . dorzolamide (TRUSOPT) 2 % ophthalmic solution INSTILL ONE DROP IN [facility] EYE TWICE DAILY    . fluticasone (FLONASE) 50 MCG/ACT nasal spray Place 2 sprays into both nostrils daily.     Marland Kitchen gabapentin (NEURONTIN) 300 MG capsule Take 1 capsule (300 mg total) by mouth 3 (three) times  daily. (Patient taking differently: Take 300 mg by mouth 2 (two) times daily. ) 90 capsule 0  . hydroxychloroquine (PLAQUENIL) 200 MG tablet Take 200 mg by mouth every other day.     . Ipratropium-Albuterol (COMBIVENT) 20-100 MCG/ACT AERS respimat Inhale 1 puff into the lungs every 6 (six) hours. 1 Inhaler 11  . isosorbide mononitrate (IMDUR) 60 MG 24 hr tablet Take 1 tablet (60 mg total) by mouth daily. 30 tablet 0  . latanoprost (XALATAN) 0.005 % ophthalmic solution INSTILL ONE DROP IN [facility] EYE AT BEDTIME    . metoprolol succinate (TOPROL-XL) 100 MG 24 hr tablet Take 100 mg by mouth daily.     . montelukast (SINGULAIR) 10 MG tablet Take 1 tablet (10 mg total) by mouth as needed. Take before Gazyva infusion. 20 tablet 0  . pantoprazole (PROTONIX) 40 MG tablet Take 40 mg by mouth daily.     . potassium chloride SA (K-DUR,KLOR-CON) 20 MEQ tablet Take 20 mEq by mouth daily.     . rivaroxaban (XARELTO) 20 MG TABS tablet Take 1 tablet (20 mg total) by mouth daily with supper. 30 tablet 3   No current facility-administered medications on file prior to visit.     Allergies  Allergen Reactions  . Ace Inhibitors Other (See Comments)    Other reaction(s): Cough  . Pravastatin Other (See Comments)    Other reaction(s): Muscle Pain  . Rosuvastatin Other (See Comments)    Other reaction(s): Other (See Comments) GI bleed  . Simvastatin Other (See Comments)    Other reaction(s): Muscle Pain  . Amoxicillin Rash  . Niacin Rash  . Penicillins Rash    Has patient had a PCN reaction causing immediate rash, facial/tongue/throat swelling, SOB or lightheadedness with hypotension: Yes Has patient had a PCN reaction causing severe rash involving mucus membranes or skin necrosis: Unknown Has patient had a PCN reaction that required hospitalization: Unknown Has patient had a PCN reaction occurring within the last 10 years: No If all of the above answers are "NO", then may proceed with Cephalosporin use.        Observations/Objective: Today's Vitals   04/14/18 0936  PainSc: 0-No pain   There is no height or weight on file to calculate BMI.  Physical Exam  Constitutional: He is oriented to person, place, and time and well-developed, well-nourished,  and in no distress. No distress.  HENT:  Head: Normocephalic and atraumatic.  Eyes: Pupils are equal, round, and reactive to light.  Neck: Normal range of motion.  Pulmonary/Chest: Effort normal. No respiratory distress.  Neurological: He is alert and oriented to person, place, and time.  Psychiatric: Affect normal.    CBC    Component Value Date/Time   WBC 4.0 04/13/2018 1311   RBC 4.66 04/13/2018 1311   HGB 14.0 04/13/2018 1311   HGB 13.7 02/13/2014 1254   HCT 42.8 04/13/2018 1311   HCT 42.5 02/13/2014 1254   PLT 72 (L) 04/13/2018 1311   PLT 228 02/13/2014 1254   MCV 91.8 04/13/2018 1311   MCV 85 02/13/2014 1254   MCH 30.0 04/13/2018 1311   MCHC 32.7 04/13/2018 1311   RDW 14.5 04/13/2018 1311   RDW 17.2 (H) 02/13/2014 1254   LYMPHSABS 0.8 04/13/2018 1311   LYMPHSABS 2.2 05/03/2013 0527   MONOABS 0.4 04/13/2018 1311   MONOABS 0.6 05/03/2013 0527   EOSABS 0.0 04/13/2018 1311   EOSABS 0.0 05/03/2013 0527   BASOSABS 0.0 04/13/2018 1311   BASOSABS 0.1 05/03/2013 0527    CMP     Component Value Date/Time   NA 137 03/12/2018 0955   NA 138 02/13/2014 1254   K 4.2 03/12/2018 0955   K 4.1 02/13/2014 1254   CL 106 03/12/2018 0955   CL 106 02/13/2014 1254   CO2 23 03/12/2018 0955   CO2 26 02/13/2014 1254   GLUCOSE 108 (H) 03/12/2018 0955   GLUCOSE 98 02/13/2014 1254   BUN 15 03/12/2018 0955   BUN 13 02/13/2014 1254   CREATININE 0.86 03/12/2018 0955   CREATININE 0.83 02/13/2014 1254   CALCIUM 8.6 (L) 03/12/2018 0955   CALCIUM 8.4 (L) 02/13/2014 1254   PROT 7.5 03/12/2018 0955   PROT 6.8 07/10/2011 0423   ALBUMIN 4.2 03/12/2018 0955   ALBUMIN 3.4 07/10/2011 0423   AST 25 03/12/2018 0955   AST 39 (H) 07/10/2011 0423   ALT  23 03/12/2018 0955   ALT 32 07/10/2011 0423   ALKPHOS 107 03/12/2018 0955   ALKPHOS 71 07/10/2011 0423   BILITOT 1.7 (H) 03/12/2018 0955   BILITOT 0.9 07/10/2011 0423   GFRNONAA >60 03/12/2018 0955   GFRNONAA >60 02/13/2014 1254   GFRNONAA >60 05/03/2013 0527   GFRAA >60 03/12/2018 0955   GFRAA >60 02/13/2014 1254   GFRAA >60 05/03/2013 0527     Assessment and Plan: 1. Thrombocytopenia ([place])   2. CLL (chronic lymphocytic leukemia) ([place])   3. Long term current use of anticoagulant therapy     Labs reviewed and discussed with patient. His thrombocytopenia continues to worse.  Platelet count is 72,000.  No active bleeding. I discussed with patient that if platelet continue to worse, I would recommend a repeat bone marrow biopsy for further evaluation. Differential includes splenomegaly, progression of CLL, ITP,  Check immature platelet fraction.  Vitamin B12 level normal in Feb 2020. Check folate level.   Patient is on Xarelto 20 mg daily for atrial fibrillation. Check CBC in 2 weeks.  May need to hold Xarelto if platelet counts drop below 50,000. Patient was advised to call if he notices any bleeding.   Follow Up Instructions: Labs in 2 weeks, follow up in 4 weeks with labs and MD assessment.   I discussed the assessment and treatment plan with the patient. The patient was provided an opportunity to ask questions and all were answered. The patient agreed with  the plan and demonstrated an understanding of the instructions.  The patient was advised to call back or seek an in-person evaluation if the symptoms worsen or if the condition fails to improve as anticipated.   I provided 18 minutes of face-to-face video visit time during this encounter, and > 50% was spent counseling as documented under my assessment & plan.  Earlie Server, MD 04/14/2018 2:15 PM

## 2018-04-28 ENCOUNTER — Inpatient Hospital Stay: Payer: Medicare PPO

## 2018-04-28 ENCOUNTER — Other Ambulatory Visit: Payer: Self-pay

## 2018-04-28 DIAGNOSIS — D696 Thrombocytopenia, unspecified: Secondary | ICD-10-CM

## 2018-04-28 DIAGNOSIS — C9190 Lymphoid leukemia, unspecified not having achieved remission: Secondary | ICD-10-CM | POA: Diagnosis not present

## 2018-04-28 DIAGNOSIS — Z7901 Long term (current) use of anticoagulants: Secondary | ICD-10-CM

## 2018-04-28 DIAGNOSIS — C9110 Chronic lymphocytic leukemia of B-cell type not having achieved remission: Secondary | ICD-10-CM

## 2018-04-28 LAB — CBC WITH DIFFERENTIAL/PLATELET
Abs Immature Granulocytes: 0.04 10*3/uL (ref 0.00–0.07)
Basophils Absolute: 0 10*3/uL (ref 0.0–0.1)
Basophils Relative: 0 %
Eosinophils Absolute: 0 10*3/uL (ref 0.0–0.5)
Eosinophils Relative: 0 %
HCT: 45.9 % (ref 39.0–52.0)
Hemoglobin: 15.5 g/dL (ref 13.0–17.0)
Immature Granulocytes: 1 %
Lymphocytes Relative: 12 %
Lymphs Abs: 0.8 10*3/uL (ref 0.7–4.0)
MCH: 30.5 pg (ref 26.0–34.0)
MCHC: 33.8 g/dL (ref 30.0–36.0)
MCV: 90.4 fL (ref 80.0–100.0)
Monocytes Absolute: 0.5 10*3/uL (ref 0.1–1.0)
Monocytes Relative: 8 %
Neutro Abs: 5 10*3/uL (ref 1.7–7.7)
Neutrophils Relative %: 79 %
Platelets: 117 10*3/uL — ABNORMAL LOW (ref 150–400)
RBC: 5.08 MIL/uL (ref 4.22–5.81)
RDW: 15.2 % (ref 11.5–15.5)
WBC: 6.3 10*3/uL (ref 4.0–10.5)
nRBC: 0 % (ref 0.0–0.2)

## 2018-04-28 LAB — IMMATURE PLATELET FRACTION: Immature Platelet Fraction: 2.2 % (ref 1.2–8.6)

## 2018-04-28 LAB — LACTATE DEHYDROGENASE: LDH: 144 U/L (ref 98–192)

## 2018-04-28 LAB — FOLATE: Folate: 9.9 ng/mL (ref >5.9)

## 2018-05-11 ENCOUNTER — Inpatient Hospital Stay: Payer: Medicare PPO | Attending: Oncology | Admitting: *Deleted

## 2018-05-11 ENCOUNTER — Other Ambulatory Visit: Payer: Self-pay

## 2018-05-11 ENCOUNTER — Other Ambulatory Visit: Payer: Self-pay | Admitting: *Deleted

## 2018-05-11 ENCOUNTER — Other Ambulatory Visit: Payer: Self-pay | Admitting: Oncology

## 2018-05-11 DIAGNOSIS — Z7901 Long term (current) use of anticoagulants: Secondary | ICD-10-CM

## 2018-05-11 DIAGNOSIS — C9110 Chronic lymphocytic leukemia of B-cell type not having achieved remission: Secondary | ICD-10-CM

## 2018-05-11 DIAGNOSIS — D696 Thrombocytopenia, unspecified: Secondary | ICD-10-CM

## 2018-05-11 DIAGNOSIS — C9190 Lymphoid leukemia, unspecified not having achieved remission: Secondary | ICD-10-CM | POA: Insufficient documentation

## 2018-05-11 LAB — CBC WITH DIFFERENTIAL/PLATELET
Abs Immature Granulocytes: 0.02 10*3/uL (ref 0.00–0.07)
Basophils Absolute: 0 10*3/uL (ref 0.0–0.1)
Basophils Relative: 0 %
Eosinophils Absolute: 0 10*3/uL (ref 0.0–0.5)
Eosinophils Relative: 0 %
HCT: 42.2 % (ref 39.0–52.0)
Hemoglobin: 13.9 g/dL (ref 13.0–17.0)
Immature Granulocytes: 0 %
Lymphocytes Relative: 14 %
Lymphs Abs: 0.7 10*3/uL (ref 0.7–4.0)
MCH: 30.9 pg (ref 26.0–34.0)
MCHC: 32.9 g/dL (ref 30.0–36.0)
MCV: 93.8 fL (ref 80.0–100.0)
Monocytes Absolute: 0.5 10*3/uL (ref 0.1–1.0)
Monocytes Relative: 10 %
Neutro Abs: 3.8 10*3/uL (ref 1.7–7.7)
Neutrophils Relative %: 76 %
Platelets: 98 10*3/uL — ABNORMAL LOW (ref 150–400)
RBC: 4.5 MIL/uL (ref 4.22–5.81)
RDW: 16.3 % — ABNORMAL HIGH (ref 11.5–15.5)
WBC: 5 10*3/uL (ref 4.0–10.5)
nRBC: 0 % (ref 0.0–0.2)

## 2018-05-11 LAB — COMPREHENSIVE METABOLIC PANEL
ALT: 21 U/L (ref 0–44)
AST: 24 U/L (ref 15–41)
Albumin: 4 g/dL (ref 3.5–5.0)
Alkaline Phosphatase: 89 U/L (ref 38–126)
Anion gap: 10 (ref 5–15)
BUN: 20 mg/dL (ref 8–23)
CO2: 23 mmol/L (ref 22–32)
Calcium: 8.8 mg/dL — ABNORMAL LOW (ref 8.9–10.3)
Chloride: 107 mmol/L (ref 98–111)
Creatinine, Ser: 0.9 mg/dL (ref 0.61–1.24)
GFR calc Af Amer: >60 mL/min (ref >60)
GFR calc non Af Amer: >60 mL/min (ref >60)
Glucose, Bld: 123 mg/dL — ABNORMAL HIGH (ref 70–99)
Potassium: 3.9 mmol/L (ref 3.5–5.1)
Sodium: 140 mmol/L (ref 135–145)
Total Bilirubin: 1.7 mg/dL — ABNORMAL HIGH (ref 0.3–1.2)
Total Protein: 6.7 g/dL (ref 6.5–8.1)

## 2018-05-12 ENCOUNTER — Other Ambulatory Visit: Payer: Self-pay

## 2018-05-12 ENCOUNTER — Inpatient Hospital Stay (HOSPITAL_BASED_OUTPATIENT_CLINIC_OR_DEPARTMENT_OTHER): Payer: Medicare PPO | Admitting: Oncology

## 2018-05-12 ENCOUNTER — Encounter: Payer: Self-pay | Admitting: Oncology

## 2018-05-12 DIAGNOSIS — I4891 Unspecified atrial fibrillation: Secondary | ICD-10-CM | POA: Diagnosis not present

## 2018-05-12 DIAGNOSIS — D696 Thrombocytopenia, unspecified: Secondary | ICD-10-CM

## 2018-05-12 DIAGNOSIS — C9110 Chronic lymphocytic leukemia of B-cell type not having achieved remission: Secondary | ICD-10-CM

## 2018-05-12 DIAGNOSIS — Z7901 Long term (current) use of anticoagulants: Secondary | ICD-10-CM | POA: Diagnosis not present

## 2018-05-12 NOTE — Progress Notes (Signed)
Called patient for Telehealth visit via WebEx.  Patient states no new concerns today

## 2018-05-13 NOTE — Progress Notes (Addendum)
HEMATOLOGY-ONCOLOGY TeleHEALTH VISIT PROGRESS NOTE  I connected with [name] on 05/12/2018  at 10:00 AM EDT by video enabled telemedicine visit and verified that I am speaking with the correct person using two identifiers. I discussed the limitations, risks, security and privacy concerns of performing an evaluation and management service by telemedicine and the availability of in-person appointments. I also discussed with the patient that there may be a patient responsible charge related to this service. The patient expressed understanding and agreed to proceed.   Other persons participating in the visit and their role in the encounter:  Geraldine Solar, CMA, check in patient    Patient's location: Home  Provider's location: Work thrombus Chief Complaint: Follow-up for management of CLL,  Thrombocytopenia   INTERVAL HISTORY Martin Adkins is a 73 y.o. male who has above history reviewed by me today presents for follow up visit for management of CLL and thrombocytopenia. Problems and complaints are listed below:  Patient reports doing well at baseline.  Denies any bleeding events, fever, chills, chest pain or abdominal pain.  Appetite is good. Denies any unintentional weight loss.  No night sweating.  Chronic fatigue at baseline.  Review of Systems  Constitutional: Positive for fatigue. Negative for appetite change, chills, fever and unexpected weight change.  HENT:   Negative for hearing loss and voice change.   Eyes: Negative for eye problems and icterus.  Respiratory: Negative for chest tightness, cough and shortness of breath.   Cardiovascular: Negative for chest pain and leg swelling.  Gastrointestinal: Negative for abdominal distention and abdominal pain.  Endocrine: Negative for hot flashes.  Genitourinary: Negative for difficulty urinating, dysuria and frequency.   Musculoskeletal: Negative for arthralgias.  Skin: Negative for itching and rash.  Neurological: Negative  for light-headedness and numbness.  Hematological: Negative for adenopathy. Does not bruise/bleed easily.  Psychiatric/Behavioral: Negative for confusion.    Past Medical History:  Diagnosis Date  . AAA (abdominal aortic aneurysm) without rupture ([place]) 04/05/2014  . AAA (abdominal aortic aneurysm) without rupture ([place]) 04/05/2014  . Abdominal aortic aneurysm without rupture ([place])   . Acute non-ST elevation myocardial infarction (NSTEMI) ([place]) 08/15/2014  . 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/28/2014  . APS (antiphospholipid syndrome) ([place])   . Arteriosclerosis of coronary artery 04/05/2014   Overview:  Sp cabg with lima to lad svg to d1 om1 rca 2004 with occluded svg to rca and d1 and pci stetn of om1 graft 2015   . Arthritis   . B12 deficiency 03/21/2017  . Barrett's esophagus   . Benign essential HTN 05/02/2014  . BPH (benign prostatic hyperplasia)   . CAD (coronary artery disease)   . CHF (congestive heart failure) ([place])   . Chronic systolic heart failure ([place]) 04/19/2014   Overview:  With segmental LV systolic dysfunction ejection fraction of 35%   . CLL (chronic lymphocytic leukemia) ([place])   . CLL (chronic lymphocytic leukemia) ([place]) 05/24/2017  . DVT (deep venous thrombosis) ([place])   . Dysrhythmia    ATRIAL FIB.  Marland Kitchen GERD (gastroesophageal reflux disease)   . History of hiatal hernia   . History of shingles 2013  . Hyperlipemia   . Hyperplastic polyps of stomach   . Hypertension   . Myocardial infarction ([place]) 07/30/2014  . NSTEMI (non-ST elevated myocardial infarction) ([place])   . OSA (obstructive sleep apnea)   . Osteoarthritis   . Pre-diabetes   . PVD (peripheral vascular disease) ([place])   . RA (rheumatoid arthritis) ([place])   . SOB (shortness  of breath)    Past Surgical History:  Procedure Laterality Date  . ABDOMINAL AORTA STENT    . CHOLECYSTECTOMY    . COLONOSCOPY  11/24/2009  . CORONARY ANGIOPLASTY WITH STENT PLACEMENT  2015  . CORONARY ARTERY BYPASS GRAFT    .  ENDOVASCULAR REPAIR/STENT GRAFT N/A 09/24/2017   Procedure: ENDOVASCULAR REPAIR/STENT GRAFT;  Surgeon: Algernon Huxley, MD;  Location: [place] [facility];  Service: Cardiovascular;  Laterality: N/A;  . ESOPHAGOGASTRODUODENOSCOPY  05/26/02  03/23/12  . ESOPHAGOGASTRODUODENOSCOPY (EGD) WITH PROPOFOL N/A 10/28/2016   Procedure: ESOPHAGOGASTRODUODENOSCOPY (EGD) WITH PROPOFOL;  Surgeon: Manya Silvas, MD;  Location[facility] ENDOSCOPY;  Service: Endoscopy;  Laterality: N/A;  . LEFT HEART CATH AND CORONARY ANGIOGRAPHY N/A 01/19/2018   Procedure: LEFT HEART CATH AND CORONARY ANGIOGRAPHY;  Surgeon: Teodoro Spray, MD;  Location: [place] [facility];  Service: Cardiovascular;  Laterality: N/A;  . PERIPHERAL VASCULAR CATHETERIZATION N/A 09/06/2014   Procedure: IVC Filter Removal;  Surgeon: Katha Cabal, MD;  Location: [place] [facility];  Service: Cardiovascular;  Laterality: N/A;  . TRANSURETHRAL RESECTION OF PROSTATE N/A 02/20/2015   Procedure: TRANSURETHRAL RESECTION OF THE PROSTATE (TURP);  Surgeon: Hollice Espy, MD;  Location: [facility] ORS;  Service: Urology;  Laterality: N/A;    Family History  Problem Relation Age of Onset  . Cancer Mother 37       breast ca  . Diabetes Mother   . Coronary artery disease Father   . Heart attack Father   . Prostate cancer Brother   . Bladder Cancer Neg Hx   . Kidney cancer Neg Hx     Social History   Socioeconomic History  . Marital status: Legally Separated    Spouse name: Not on file  . Number of children: Not on file  . Years of education: Not on file  . Highest education level: Not on file  Occupational History  . Not on file  Social Needs  . Financial resource strain: Not hard at all  . Food insecurity:    Worry: Never true    Inability: Never true  . Transportation needs:    Medical: No    Non-medical: No  Tobacco Use  . Smoking status: Former Smoker    Packs/day: 0.50    Years: 20.00    Pack years: 10.00    Types: Cigarettes  .  Smokeless tobacco: Never Used  . Tobacco comment: Quit   Substance and Sexual Activity  . Alcohol use: Yes    Alcohol/week: 1.0 - 2.0 standard drinks    Types: 1 - 2 Shots of liquor per week    Comment: social on weekends  . Drug use: No  . Sexual activity: Never  Lifestyle  . Physical activity:    Days per week: 7 days    Minutes per session: 40 min  . Stress: Not at all  Relationships  . Social connections:    Talks on phone: More than three times a week    Gets together: More than three times a week    Attends religious service: More than 4 times per year    Active member of club or organization: No    Attends meetings of clubs or organizations: Never    Relationship status: Separated  . Intimate partner violence:    Fear of current or ex partner: No    Emotionally abused: No    Physically abused: No    Forced sexual activity: No  Other Topics Concern  .  Not on file  Social History Narrative  . Not on file    Current Outpatient Medications on File Prior to Visit  Medication Sig Dispense Refill  . albuterol (PROVENTIL HFA;VENTOLIN HFA) 108 (90 Base) MCG/ACT inhaler Inhale 2 puffs into the lungs every 6 (six) hours as needed for wheezing or shortness of breath. 1 Inhaler 0  . amLODipine (NORVASC) 10 MG tablet Take 10 mg by mouth daily.     Marland Kitchen atorvastatin (LIPITOR) 80 MG tablet Take 80 mg by mouth daily.   11  . Cyanocobalamin 1000 MCG CAPS Take 1,000 mcg by mouth daily. 90 capsule 0  . dorzolamide (TRUSOPT) 2 % ophthalmic solution INSTILL ONE DROP IN [facility] EYE TWICE DAILY    . fluticasone (FLONASE) 50 MCG/ACT nasal spray Place 2 sprays into both nostrils daily.     Marland Kitchen gabapentin (NEURONTIN) 300 MG capsule Take 1 capsule (300 mg total) by mouth 3 (three) times daily. (Patient taking differently: Take 300 mg by mouth 2 (two) times daily. ) 90 capsule 0  . hydroxychloroquine (PLAQUENIL) 200 MG tablet Take 200 mg by mouth every other day.     . Ipratropium-Albuterol (COMBIVENT)  20-100 MCG/ACT AERS respimat Inhale 1 puff into the lungs every 6 (six) hours. 1 Inhaler 11  . isosorbide mononitrate (IMDUR) 60 MG 24 hr tablet Take 1 tablet (60 mg total) by mouth daily. 30 tablet 0  . latanoprost (XALATAN) 0.005 % ophthalmic solution INSTILL ONE DROP IN [facility] EYE AT BEDTIME    . metoprolol succinate (TOPROL-XL) 100 MG 24 hr tablet Take 100 mg by mouth daily.     . montelukast (SINGULAIR) 10 MG tablet Take 1 tablet (10 mg total) by mouth as needed. Take before Gazyva infusion. 20 tablet 0  . pantoprazole (PROTONIX) 40 MG tablet Take 40 mg by mouth daily.     . potassium chloride SA (K-DUR,KLOR-CON) 20 MEQ tablet Take 20 mEq by mouth daily.     . rivaroxaban (XARELTO) 20 MG TABS tablet Take 1 tablet (20 mg total) by mouth daily with supper. 30 tablet 3   No current facility-administered medications on file prior to visit.     Allergies  Allergen Reactions  . Ace Inhibitors Other (See Comments)    Other reaction(s): Cough  . Pravastatin Other (See Comments)    Other reaction(s): Muscle Pain  . Rosuvastatin Other (See Comments)    Other reaction(s): Other (See Comments) GI bleed  . Simvastatin Other (See Comments)    Other reaction(s): Muscle Pain  . Amoxicillin Rash  . Niacin Rash  . Penicillins Rash    Has patient had a PCN reaction causing immediate rash, facial/tongue/throat swelling, SOB or lightheadedness with hypotension: Yes Has patient had a PCN reaction causing severe rash involving mucus membranes or skin necrosis: Unknown Has patient had a PCN reaction that required hospitalization: Unknown Has patient had a PCN reaction occurring within the last 10 years: No If all of the above answers are "NO", then may proceed with Cephalosporin use.       Observations/Objective: Today's Vitals   05/12/18 1004  PainSc: 0-No pain   There is no height or weight on file to calculate BMI.  Physical Exam  Constitutional: He is oriented to person, place, and time. No  distress.  HENT:  Head: Atraumatic.  Pulmonary/Chest: Effort normal.  Neurological: He is alert and oriented to person, place, and time.  Psychiatric: Affect normal.    CBC    Component Value Date/Time   WBC  5.0 05/11/2018 1230   RBC 4.50 05/11/2018 1230   HGB 13.9 05/11/2018 1230   HGB 13.7 02/13/2014 1254   HCT 42.2 05/11/2018 1230   HCT 42.5 02/13/2014 1254   PLT 98 (L) 05/11/2018 1230   PLT 228 02/13/2014 1254   MCV 93.8 05/11/2018 1230   MCV 85 02/13/2014 1254   MCH 30.9 05/11/2018 1230   MCHC 32.9 05/11/2018 1230   RDW 16.3 (H) 05/11/2018 1230   RDW 17.2 (H) 02/13/2014 1254   LYMPHSABS 0.7 05/11/2018 1230   LYMPHSABS 2.2 05/03/2013 0527   MONOABS 0.5 05/11/2018 1230   MONOABS 0.6 05/03/2013 0527   EOSABS 0.0 05/11/2018 1230   EOSABS 0.0 05/03/2013 0527   BASOSABS 0.0 05/11/2018 1230   BASOSABS 0.1 05/03/2013 0527    CMP     Component Value Date/Time   NA 140 05/11/2018 1041   NA 138 02/13/2014 1254   K 3.9 05/11/2018 1041   K 4.1 02/13/2014 1254   CL 107 05/11/2018 1041   CL 106 02/13/2014 1254   CO2 23 05/11/2018 1041   CO2 26 02/13/2014 1254   GLUCOSE 123 (H) 05/11/2018 1041   GLUCOSE 98 02/13/2014 1254   BUN 20 05/11/2018 1041   BUN 13 02/13/2014 1254   CREATININE 0.90 05/11/2018 1041   CREATININE 0.83 02/13/2014 1254   CALCIUM 8.8 (L) 05/11/2018 1041   CALCIUM 8.4 (L) 02/13/2014 1254   PROT 6.7 05/11/2018 1041   PROT 6.8 07/10/2011 0423   ALBUMIN 4.0 05/11/2018 1041   ALBUMIN 3.4 07/10/2011 0423   AST 24 05/11/2018 1041   AST 39 (H) 07/10/2011 0423   ALT 21 05/11/2018 1041   ALT 32 07/10/2011 0423   ALKPHOS 89 05/11/2018 1041   ALKPHOS 71 07/10/2011 0423   BILITOT 1.7 (H) 05/11/2018 1041   BILITOT 0.9 07/10/2011 0423   GFRNONAA >60 05/11/2018 1041   GFRNONAA >60 02/13/2014 1254   GFRNONAA >60 05/03/2013 0527   GFRAA >60 05/11/2018 1041   GFRAA >60 02/13/2014 1254   GFRAA >60 05/03/2013 0527     Assessment and Plan: 1.  Thrombocytopenia ([place])   2. CLL (chronic lymphocytic leukemia) ([place])   3. Long term current use of anticoagulant therapy     Labs reviewed and discussed with patient. Chronic thrombocytopenia, slightly worse than his baseline.  Continue to monitor.  No bleeding events. Patient has been on anticoagulation with Xarelto for atrial fibrillation.  Tolerating well.  Continue same dose.  Stable creatinine  For CLL treatment, patient previously developed cytopenia due to Venetoclax.  Has been off Venetoclax since November 2019.  Recent CT on January 2020 reviewed no splenomegaly.  He does not have any constitutional symptoms at this time point.  Continue observation for now.  Recommend check labs in 6 to 8 weeks and follow-up in clinic in 3 months.  Follow Up Instructions: Lab encounter week of 06/29/2018 Lab MD assessment in week of 08/06/2018   I discussed the assessment and treatment plan with the patient. The patient was provided an opportunity to ask questions and all were answered. The patient agreed with the plan and demonstrated an understanding of the instructions.  The patient was advised to call back or seek an in-person evaluation if the symptoms worsen or if the condition fails to improve as anticipated.    Earlie Server, MD 05/13/2018 4:01 PM

## 2018-05-20 ENCOUNTER — Telehealth: Payer: Self-pay | Admitting: *Deleted

## 2018-05-20 NOTE — Telephone Encounter (Signed)
Called to check in on patient.  Last attempt was on 4/20.  Both times I left a message and also sent and email requesting him to let us know how he is doing at home.  Thus far, no return contact.

## 2018-06-09 ENCOUNTER — Other Ambulatory Visit: Payer: Self-pay | Admitting: Oncology

## 2018-06-09 NOTE — Telephone Encounter (Signed)
Brenda- this is a Dr. Yu patient. 

## 2018-06-12 ENCOUNTER — Other Ambulatory Visit: Payer: Self-pay

## 2018-06-12 ENCOUNTER — Ambulatory Visit
Admission: RE | Admit: 2018-06-12 | Discharge: 2018-06-12 | Disposition: A | Discharge status: Home / Self Care | Payer: Medicare PPO | Source: Ambulatory Visit | Attending: Internal Medicine | Admitting: Internal Medicine

## 2018-06-12 ENCOUNTER — Other Ambulatory Visit: Payer: Self-pay | Admitting: Internal Medicine

## 2018-06-12 DIAGNOSIS — R42 Dizziness and giddiness: Secondary | ICD-10-CM | POA: Diagnosis not present

## 2018-06-30 ENCOUNTER — Other Ambulatory Visit: Payer: Self-pay

## 2018-06-30 ENCOUNTER — Inpatient Hospital Stay: Payer: Medicare PPO | Attending: Oncology

## 2018-06-30 DIAGNOSIS — C9190 Lymphoid leukemia, unspecified not having achieved remission: Secondary | ICD-10-CM | POA: Insufficient documentation

## 2018-06-30 DIAGNOSIS — C9110 Chronic lymphocytic leukemia of B-cell type not having achieved remission: Secondary | ICD-10-CM

## 2018-06-30 DIAGNOSIS — D696 Thrombocytopenia, unspecified: Secondary | ICD-10-CM

## 2018-06-30 LAB — CBC WITH DIFFERENTIAL/PLATELET
Abs Immature Granulocytes: 0.02 10*3/uL (ref 0.00–0.07)
Basophils Absolute: 0 10*3/uL (ref 0.0–0.1)
Basophils Relative: 1 %
Eosinophils Absolute: 0 10*3/uL (ref 0.0–0.5)
Eosinophils Relative: 1 %
HCT: 43.9 % (ref 39.0–52.0)
Hemoglobin: 14.7 g/dL (ref 13.0–17.0)
Immature Granulocytes: 0 %
Lymphocytes Relative: 22 %
Lymphs Abs: 1 10*3/uL (ref 0.7–4.0)
MCH: 31.1 pg (ref 26.0–34.0)
MCHC: 33.5 g/dL (ref 30.0–36.0)
MCV: 92.8 fL (ref 80.0–100.0)
Monocytes Absolute: 0.5 10*3/uL (ref 0.1–1.0)
Monocytes Relative: 11 %
Neutro Abs: 2.9 10*3/uL (ref 1.7–7.7)
Neutrophils Relative %: 65 %
Platelets: 126 10*3/uL — ABNORMAL LOW (ref 150–400)
RBC: 4.73 MIL/uL (ref 4.22–5.81)
RDW: 14.7 % (ref 11.5–15.5)
WBC: 4.5 10*3/uL (ref 4.0–10.5)
nRBC: 0 % (ref 0.0–0.2)

## 2018-06-30 LAB — COMPREHENSIVE METABOLIC PANEL
ALT: 26 U/L (ref 0–44)
AST: 31 U/L (ref 15–41)
Albumin: 4.4 g/dL (ref 3.5–5.0)
Alkaline Phosphatase: 102 U/L (ref 38–126)
Anion gap: 11 (ref 5–15)
BUN: 16 mg/dL (ref 8–23)
CO2: 21 mmol/L — ABNORMAL LOW (ref 22–32)
Calcium: 8.9 mg/dL (ref 8.9–10.3)
Chloride: 106 mmol/L (ref 98–111)
Creatinine, Ser: 0.9 mg/dL (ref 0.61–1.24)
GFR calc Af Amer: >60 mL/min (ref >60)
GFR calc non Af Amer: >60 mL/min (ref >60)
Glucose, Bld: 112 mg/dL — ABNORMAL HIGH (ref 70–99)
Potassium: 3.7 mmol/L (ref 3.5–5.1)
Sodium: 138 mmol/L (ref 135–145)
Total Bilirubin: 1.6 mg/dL — ABNORMAL HIGH (ref 0.3–1.2)
Total Protein: 7.5 g/dL (ref 6.5–8.1)

## 2018-06-30 LAB — LACTATE DEHYDROGENASE: LDH: 143 U/L (ref 98–192)

## 2018-07-02 LAB — COMP PANEL: LEUKEMIA/LYMPHOMA

## 2018-07-15 ENCOUNTER — Encounter: Payer: Medicare PPO | Attending: Cardiology | Admitting: *Deleted

## 2018-07-15 ENCOUNTER — Other Ambulatory Visit: Payer: Self-pay

## 2018-07-15 DIAGNOSIS — I214 Non-ST elevation (NSTEMI) myocardial infarction: Secondary | ICD-10-CM | POA: Insufficient documentation

## 2018-07-15 NOTE — Progress Notes (Signed)
Daily Session Note  Patient Details  Name: Martin Adkins MRN: [id number] Date of Birth: 1945/12/20 Referring Provider:     Cardiac Rehab from 01/29/2018 in [facility] Cardiac and Pulmonary Rehab  Referring Provider  [name]      Encounter Date: 07/15/2018  Check In: Session Check In - 07/15/18 1119      Check-In   Supervising physician immediately available to respond to emergencies  See telemetry face sheet for immediately available ER MD    Location  [facility]-Cardiac & Pulmonary Rehab    Staff Present  Jasper Loser BS, Exercise Physiologist;Melissa Caiola RDN, LDN;Joseph [place] [employer];Heath Lark, RN, BSN, CCRP    Virtual Visit  No    Medication changes reported      No    Fall or balance concerns reported     No    Tobacco Cessation  No Change    Warm-up and Cool-down  Performed on first and last piece of equipment    Resistance Training Performed  Yes    VAD Patient?  No    PAD/SET Patient?  No      Pain Assessment   Currently in Pain?  No/denies          Social History   Tobacco Use  Smoking Status Former Smoker  . Packs/day: 0.50  . Years: 20.00  . Pack years: 10.00  . Types: Cigarettes  Smokeless Tobacco Never Used  Tobacco Comment   Quit     Goals Met:  Independence with exercise equipment Exercise tolerated well No report of cardiac concerns or symptoms Strength training completed today  Goals Unmet:  Not Applicable  Comments: Pt able to follow exercise prescription today without complaint.  Will continue to monitor for progression.    Dr. Emily Filbert is Medical Director for [place] and [facility].

## 2018-07-20 ENCOUNTER — Encounter: Payer: Medicare PPO | Admitting: *Deleted

## 2018-07-20 ENCOUNTER — Other Ambulatory Visit: Payer: Self-pay

## 2018-07-20 DIAGNOSIS — I214 Non-ST elevation (NSTEMI) myocardial infarction: Secondary | ICD-10-CM

## 2018-07-20 NOTE — Progress Notes (Signed)
Daily Session Note  Patient Details  Name: Martin Adkins MRN: [id number] Date of Birth: [birth year] Referring Provider:     Cardiac Rehab from 01/29/2018 in [facility] Cardiac and Pulmonary Rehab  Referring Provider  [name]      Encounter Date: 07/20/2018  Check In: Session Check In - 07/20/18 0949      Check-In   Supervising physician immediately available to respond to emergencies  See telemetry face sheet for immediately available ER MD    Location  [facility]-Cardiac & Pulmonary Rehab    Staff Present  Earlean Shawl, BS, ACSM CEP, Exercise Physiologist;Amanda Oletta Darter, BA, ACSM CEP, Exercise Physiologist;Joseph [place] [employer];Heath Lark, RN, BSN, CCRP    Virtual Visit  No    Medication changes reported      No    Fall or balance concerns reported     No    Tobacco Cessation  No Change    Warm-up and Cool-down  Performed on first and last piece of equipment    Resistance Training Performed  Yes    VAD Patient?  No    PAD/SET Patient?  No      Pain Assessment   Currently in Pain?  No/denies    Multiple Pain Sites  No          Social History   Tobacco Use  Smoking Status Former Smoker  . Packs/day: 0.50  . Years: 20.00  . Pack years: 10.00  . Types: Cigarettes  Smokeless Tobacco Never Used  Tobacco Comment   Quit     Goals Met:  Independence with exercise equipment Exercise tolerated well No report of cardiac concerns or symptoms Strength training completed today  Goals Unmet:  Not Applicable  Comments: Pt able to follow exercise prescription today without complaint.  Will continue to monitor for progression.    Dr. Emily Filbert is Medical Director for [place] and [facility].

## 2018-07-22 ENCOUNTER — Other Ambulatory Visit: Payer: Self-pay

## 2018-07-22 ENCOUNTER — Encounter: Payer: Medicare PPO | Admitting: *Deleted

## 2018-07-22 ENCOUNTER — Encounter: Payer: Self-pay | Admitting: *Deleted

## 2018-07-22 DIAGNOSIS — I214 Non-ST elevation (NSTEMI) myocardial infarction: Secondary | ICD-10-CM | POA: Diagnosis not present

## 2018-07-22 NOTE — Progress Notes (Signed)
Daily Session Note  Patient Details  Name: Martin Adkins MRN: [id number] Date of Birth: [birth year] Referring Provider:     Cardiac Rehab from 01/29/2018 in [facility] Cardiac and Pulmonary Rehab  Referring Provider  [name]      Encounter Date: 07/22/2018  Check In: Session Check In - 07/22/18 0911      Check-In   Supervising physician immediately available to respond to emergencies  See telemetry face sheet for immediately available ER MD    Location  [facility]-Cardiac & Pulmonary Rehab    Staff Present  Alberteen Sam, MA, RCEP, CCRP, CCET;Joseph [place];Heath Lark, RN, BSN, CCRP    Virtual Visit  No    Medication changes reported      No    Fall or balance concerns reported     No    Warm-up and Cool-down  Performed on first and last piece of equipment    Resistance Training Performed  Yes    VAD Patient?  No    PAD/SET Patient?  No      Pain Assessment   Currently in Pain?  No/denies          Social History   Tobacco Use  Smoking Status Former Smoker  . Packs/day: 0.50  . Years: 20.00  . Pack years: 10.00  . Types: Cigarettes  Smokeless Tobacco Never Used  Tobacco Comment   Quit     Goals Met:  Independence with exercise equipment Exercise tolerated well No report of cardiac concerns or symptoms Strength training completed today  Goals Unmet:  Not Applicable  Comments: Pt able to follow exercise prescription today without complaint.  Will continue to monitor for progression.    Dr. Emily Filbert is Medical Director for [place] and [facility].

## 2018-07-22 NOTE — Progress Notes (Signed)
Cardiac Individual Treatment Plan  Patient Details  Name: Martin Adkins MRN: [id number] Date of Birth: October 15, 1945 Referring Provider:     Cardiac Rehab from 01/29/2018 in [facility] Cardiac and Pulmonary Rehab  Referring Provider  [name]      Initial Encounter Date:    Cardiac Rehab from 01/29/2018 in [facility] Cardiac and Pulmonary Rehab  Date  01/29/18      Visit Diagnosis: NSTEMI (non-ST elevated myocardial infarction) [facility])  Patient's Home Medications on Admission:  Current Outpatient Medications:  .  albuterol (PROVENTIL HFA;VENTOLIN HFA) 108 (90 Base) MCG/ACT inhaler, Inhale 2 puffs into the lungs every 6 (six) hours as needed for wheezing or shortness of breath., Disp: 1 Inhaler, Rfl: 0 .  amLODipine (NORVASC) 10 MG tablet, Take 10 mg by mouth daily. , Disp: , Rfl:  .  atorvastatin (LIPITOR) 80 MG tablet, Take 80 mg by mouth daily. , Disp: , Rfl: 11 .  dorzolamide (TRUSOPT) 2 % ophthalmic solution, INSTILL ONE DROP IN [facility] EYE TWICE DAILY, Disp: , Rfl:  .  fluticasone (FLONASE) 50 MCG/ACT nasal spray, Place 2 sprays into both nostrils daily. , Disp: , Rfl:  .  gabapentin (NEURONTIN) 300 MG capsule, Take 1 capsule (300 mg total) by mouth 3 (three) times daily. (Patient taking differently: Take 300 mg by mouth 2 (two) times daily. ), Disp: 90 capsule, Rfl: 0 .  hydroxychloroquine (PLAQUENIL) 200 MG tablet, Take 200 mg by mouth every other day. , Disp: , Rfl:  .  Ipratropium-Albuterol (COMBIVENT) 20-100 MCG/ACT AERS respimat, Inhale 1 puff into the lungs every 6 (six) hours., Disp: 1 Inhaler, Rfl: 11 .  isosorbide mononitrate (IMDUR) 60 MG 24 hr tablet, Take 1 tablet (60 mg total) by mouth daily., Disp: 30 tablet, Rfl: 0 .  latanoprost (XALATAN) 0.005 % ophthalmic solution, INSTILL ONE DROP IN [facility] EYE AT BEDTIME, Disp: , Rfl:  .  metoprolol succinate (TOPROL-XL) 100 MG 24 hr tablet, Take 100 mg by mouth daily. , Disp: , Rfl:  .  montelukast (SINGULAIR) 10 MG tablet, Take 1 tablet (10 mg  total) by mouth as needed. Take before Gazyva infusion., Disp: 20 tablet, Rfl: 0 .  pantoprazole (PROTONIX) 40 MG tablet, Take 40 mg by mouth daily. , Disp: , Rfl:  .  potassium chloride SA (K-DUR,KLOR-CON) 20 MEQ tablet, Take 20 mEq by mouth daily. , Disp: , Rfl:  .  rivaroxaban (XARELTO) 20 MG TABS tablet, Take 1 tablet (20 mg total) by mouth daily with supper., Disp: 30 tablet, Rfl: 3 .  vitamin B-12 (CYANOCOBALAMIN) 1000 MCG tablet, TAKE ONE TABLET BY MOUTH ONCE DAILY, Disp: 90 tablet, Rfl: 0  Past Medical History: Past Medical History:  Diagnosis Date  . AAA (abdominal aortic aneurysm) without rupture ([place]) 04/05/2014  . AAA (abdominal aortic aneurysm) without rupture ([place]) 04/05/2014  . Abdominal aortic aneurysm without rupture ([place])   . Acute non-ST elevation myocardial infarction (NSTEMI) ([place]) 08/15/2014  . 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/28/2014  . APS (antiphospholipid syndrome) ([place])   . Arteriosclerosis of coronary artery 04/05/2014   Overview:  Sp cabg with lima to lad svg to d1 om1 rca 2004 with occluded svg to rca and d1 and pci stetn of om1 graft 2015   . Arthritis   . B12 deficiency 03/21/2017  . Barrett's esophagus   . Benign essential HTN 05/02/2014  . BPH (benign prostatic hyperplasia)   . CAD (coronary artery disease)   . CHF (congestive heart failure) ([place])   . Chronic systolic heart failure (  [place]) 04/19/2014   Overview:  With segmental LV systolic dysfunction ejection fraction of 35%   . CLL (chronic lymphocytic leukemia) ([place])   . CLL (chronic lymphocytic leukemia) ([place]) 05/24/2017  . DVT (deep venous thrombosis) ([place])   . Dysrhythmia    ATRIAL FIB.  Marland Kitchen GERD (gastroesophageal reflux disease)   . History of hiatal hernia   . History of shingles 2013  . Hyperlipemia   . Hyperplastic polyps of stomach   . Hypertension   . Myocardial infarction ([place]) 07/30/2014  . NSTEMI (non-ST elevated myocardial infarction) ([place])   . OSA (obstructive sleep apnea)   .  Osteoarthritis   . Pre-diabetes   . PVD (peripheral vascular disease) ([place])   . RA (rheumatoid arthritis) ([place])   . SOB (shortness of breath)     Tobacco Use: Social History   Tobacco Use  Smoking Status Former Smoker  . Packs/day: 0.50  . Years: 20.00  . Pack years: 10.00  . Types: Cigarettes  Smokeless Tobacco Never Used  Tobacco Comment   Quit     Labs: Recent Review Flowsheet Data    Labs for ITP Cardiac and Pulmonary Rehab Latest Ref Rng & Units 05/03/2013 01/19/2018   Cholestrol 0 - 200 mg/dL 196 116   LDLCALC 0 - 99 mg/dL 121(H) 49   HDL >40 mg/dL 42 51   Trlycerides <150 mg/dL 164 81       Exercise Target Goals: Exercise Program Goal: Individual exercise prescription set using results from initial 6 min walk test and THRR while considering  patient's activity barriers and safety.   Exercise Prescription Goal: Initial exercise prescription builds to 30-45 minutes a day of aerobic activity, 2-3 days per week.  Home exercise guidelines will be given to patient during program as part of exercise prescription that the participant will acknowledge.  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 See MD 1/24 to see next step in care.  SOB with exertion, fatigued. Was receiving Chiroparactic Care until stopped by Cancer MD for low platelet count   Cardiac Risk Stratification  High       6 Minute Walk: 6 Minute Walk    Row Name 01/29/18 1438         6 Minute Walk   Phase  Initial     Distance  1235 feet     Walk Time  6 minutes     # of Rest Breaks  0     MPH  2.34     METS  2.8     RPE  11     Perceived Dyspnea   0     VO2 Peak  9.78     Symptoms  Yes (comment)     Comments  L hip pain due to arthritis 2/10     Resting HR  79 bpm      Resting BP  126/70     Exercise Oxygen Saturation  during 6 min walk  97 %     Max Ex. HR  109 bpm     Max Ex. BP  120/68     2 Minute Post BP  122/70        Oxygen Initial Assessment:   Oxygen Re-Evaluation:   Oxygen Discharge (Final Oxygen Re-Evaluation):   Initial Exercise  Prescription: Initial Exercise Prescription - 01/29/18 1400      Date of Initial Exercise RX and Referring Provider   Date  01/29/18    Referring Provider  [name]      Treadmill   MPH  2    Grade  1    Minutes  15    METs  2.8      Recumbant Bike   Level  2    RPM  60    Watts  24    Minutes  15      NuStep   Level  3    SPM  80    Minutes  15    METs  2.8      Prescription Details   Frequency (times per week)  3    Duration  Progress to 30 minutes of continuous aerobic without signs/symptoms of physical distress      Intensity   THRR 40-80% of Max Heartrate  107-136    Ratings of Perceived Exertion  11-15    Perceived Dyspnea  0-4      Resistance Training   Training Prescription  Yes    Weight  3 lb    Reps  10-15       Perform Capillary Blood Glucose checks as needed.  Exercise Prescription Changes: Exercise Prescription Changes    Row Name 01/29/18 1400 02/10/18 1300 02/12/18 0900 02/25/18 0900 03/11/18 0900     Response to Exercise   Blood Pressure (Admit)  126/70  132/64  -  124/68  124/62   Blood Pressure (Exercise)  120/68  144/82  -  126/80  136/80   Blood Pressure (Exit)  122/70  108/70  -  112/78  110/74   Heart Rate (Admit)  79 bpm  91 bpm  -  71 bpm  79 bpm   Heart Rate (Exercise)  109 bpm  115 bpm  -  113 bpm  113 bpm   Heart Rate (Exit)  77 bpm  94 bpm  -  92 bpm  87 bpm   Oxygen Saturation (Exit)  97 %  -  -  -  -   Rating of Perceived Exertion (Exercise)  11  13  -  13  12   Symptoms  -  none  -  none  none   Duration  -  Continue with 30 min of aerobic exercise without signs/symptoms of physical distress.  -  Continue with 30 min of aerobic exercise without  signs/symptoms of physical distress.  Continue with 30 min of aerobic exercise without signs/symptoms of physical distress.   Intensity  -  THRR unchanged  -  THRR unchanged  THRR unchanged     Progression   Progression  -  Continue to progress workloads to maintain intensity without signs/symptoms of physical distress.  -  Continue to progress workloads to maintain intensity without signs/symptoms of physical distress.  Continue to progress workloads to maintain intensity without signs/symptoms of physical distress.   Average METs  -  2.77  -  2.95  2.89     Resistance Training   Training Prescription  -  Yes  -  Yes  Yes   Weight  -  3 lbs  -  4 lbs  4 lbs   Reps  -  10-15  -  10-15  10-15     Interval Training   Interval Training  -  No  -  No  No  Treadmill   MPH  -  2  -  2.2  2.2   Grade  -  1  -  1  1   Minutes  -  15  -  15  15   METs  -  2.81  -  2.99  2.99     Recumbant Bike   Level  -  2  -  -  -   Minutes  -  15  -  -  -   METs  -  2.2  -  -  -     NuStep   Level  -  3  -  4  4   Minutes  -  15  -  15  15   METs  -  3.3  -  2.9  2.8     Home Exercise Plan   Plans to continue exercise at  -  -  [place] 2 additional days to program exercise sessions.  Add 2 additional days to program exercise sessions.  Add 2 additional days to program exercise sessions.   Initial Home Exercises Provided  -  -  02/12/18  02/12/18  02/12/18   Row Name 03/25/18 0900             Response to Exercise   Blood Pressure (Admit)  130/80       Blood Pressure (Exercise)  128/80       Blood Pressure (Exit)  124/78       Heart Rate (Admit)  82 bpm       Heart Rate (Exercise)  115 bpm       Heart Rate (Exit)  81 bpm       Rating of Perceived Exertion (Exercise)  13       Symptoms  none       Duration  Continue with 30 min of aerobic exercise without signs/symptoms of physical distress.       Intensity  THRR  unchanged         Progression   Progression  Continue to progress workloads to maintain intensity without signs/symptoms of physical distress.       Average METs  2.99         Resistance Training   Training Prescription  Yes       Weight  4 lbs       Reps  10-15         Interval Training   Interval Training  No         Treadmill   MPH  2.2       Grade  1       Minutes  15       METs  2.99         NuStep   Level  4       Minutes  15       METs  3         Home Exercise Plan   Plans to continue exercise at  [facility]       Frequency  Add 2 additional days to program exercise sessions.       Initial Home Exercises Provided  02/12/18          Exercise Comments: Exercise Comments    Row Name 02/03/18 [phone] 02/12/18 0915 02/19/18 9563  Exercise Comments  First full day of exercise!  Patient was oriented to gym and equipment including functions, settings, policies, and procedures.  Patient's individual exercise prescription and treatment plan were reviewed.  All starting workloads were established based on the results of the 6 minute walk test done at initial orientation visit.  The plan for exercise progression was also introduced and progression will be customized based on patient's performance and goals.  Reviewed home exercise with pt today.  Pt plans to use the [facility] for exercise.  Reviewed THR, pulse, RPE, sign and symptoms, NTG use, and when to call 911 or MD.  Also discussed weather considerations and indoor options.  Pt voiced understanding.  Martin Adkins has started at the [facility] and reports that he definitely noticed his legs being more tired and athritis flaring more.         Exercise Goals and Review: Exercise Goals    Row Name 01/29/18 1437             Exercise Goals   Increase Physical Activity  Yes       Intervention  Provide advice, education, support and counseling about physical activity/exercise needs.;Develop an individualized exercise  prescription for aerobic and resistive training based on initial evaluation findings, risk stratification, comorbidities and participant's personal goals.       Expected Outcomes  Short Term: Attend rehab on a regular basis to increase amount of physical activity.;Long Term: Add in home exercise to make exercise part of routine and to increase amount of physical activity.;Long Term: Exercising regularly at least 3-5 days a week.       Increase Strength and Stamina  Yes       Intervention  Provide advice, education, support and counseling about physical activity/exercise needs.;Develop an individualized exercise prescription for aerobic and resistive training based on initial evaluation findings, risk stratification, comorbidities and participant's personal goals.       Expected Outcomes  Short Term: Increase workloads from initial exercise prescription for resistance, speed, and METs.;Short Term: Perform resistance training exercises routinely during rehab and add in resistance training at home;Long Term: Improve cardiorespiratory fitness, muscular endurance and strength as measured by increased METs and functional capacity (6MWT)       Able to understand and use rate of perceived exertion (RPE) scale  Yes       Intervention  Provide education and explanation on how to use RPE scale       Expected Outcomes  Short Term: Able to use RPE daily in rehab to express subjective intensity level;Long Term:  Able to use RPE to guide intensity level when exercising independently       Knowledge and understanding of Target Heart Rate Range (THRR)  Yes       Intervention  Provide education and explanation of THRR including how the numbers were predicted and where they are located for reference       Expected Outcomes  Short Term: Able to state/look up THRR;Short Term: Able to use daily as guideline for intensity in rehab;Long Term: Able to use THRR to govern intensity when exercising independently       Able to check  pulse independently  Yes       Intervention  Provide education and demonstration on how to check pulse in carotid and radial arteries.;Review the importance of being able to check your own pulse for safety during independent exercise       Expected Outcomes  Short Term: Able to explain why pulse checking  is important during independent exercise;Long Term: Able to check pulse independently and accurately       Understanding of Exercise Prescription  Yes       Intervention  Provide education, explanation, and written materials on patient's individual exercise prescription       Expected Outcomes  Short Term: Able to explain program exercise prescription;Long Term: Able to explain home exercise prescription to exercise independently          Exercise Goals Re-Evaluation : Exercise Goals Re-Evaluation    Row Name 02/03/18 0757 02/10/18 1300 02/19/18 0935 02/25/18 0859 03/19/18 1251     Exercise Goal Re-Evaluation   Exercise Goals Review  Increase Physical Activity;Able to understand and use rate of perceived exertion (RPE) scale;Knowledge and understanding of Target Heart Rate Range (THRR);Increase Strength and Stamina;Understanding of Exercise Prescription  Increase Physical Activity;Increase Strength and Stamina;Understanding of Exercise Prescription  Increase Physical Activity;Able to understand and use rate of perceived exertion (RPE) scale;Knowledge and understanding of Target Heart Rate Range (THRR);Understanding of Exercise Prescription;Increase Strength and Stamina;Able to check pulse independently  Increase Physical Activity;Increase Strength and Stamina;Understanding of Exercise Prescription  Increase Physical Activity;Increase Strength and Stamina;Understanding of Exercise Prescription   Comments  Reviewed RPE scale, THR and program prescription with pt today.  Pt voiced understanding and was given a copy of goals to take home.   Martin Adkins is off to a good start in rehab.  He has completed three full  days of exercise so far.  We will be going over his home exercise guidelines soon.  We will continue to monitor his progress.   Martin Adkins has noticed that he is able to do more at home and do it more efficiently. He has to go up and down stairs to do laundry and has found that to be easier since starting cardiac rehab.   Martin Adkins continues to do well in rehab.  He is up to level 4 on the NuStep.  We will continue to monitor his progress.   [name] has been doing well in rehab.  He was out on [place] due to back and hip pain.  This had been his biggest inhibitor. It keeps him from doing as much as he wants at home and limits his exercise.  Overall, he is doing better and it has started to ease.  He does report feeling stonger and has more stamina.    Expected Outcomes  Short: Use RPE daily to regulate intensity. Long: Follow program prescription in THR.  Short: Review home exercies guidelines.  Long: Continue to increase strength and stamina  Short: He has started exercising at the [facility] and will work up to 3 days per week. Long: Continue exercising and building strength after graduating.  Short: Increase workload on treadmill.  Long: Continue to exercise on off days at [place]: Walk more at home on off days.  Long: Continue to increase strength and stamina.    [place] Name 04/01/18 1225             Exercise Goal Re-Evaluation   Exercise Goals Review  Increase Physical Activity;Increase Strength and Stamina;Understanding of Exercise Prescription       Comments  Spoke with Martin Adkins on the phone today.  Martin Adkins has not been doing as much as he should. He is doing what he can at home. He misses coming into to class.        Expected Outcomes  Short: Walk more at home.  Long: Continue to maintain strength and stamina.  Discharge Exercise Prescription (Final Exercise Prescription Changes): Exercise Prescription Changes - 03/25/18 0900      Response to Exercise   Blood Pressure (Admit)  130/80    Blood Pressure  (Exercise)  128/80    Blood Pressure (Exit)  124/78    Heart Rate (Admit)  82 bpm    Heart Rate (Exercise)  115 bpm    Heart Rate (Exit)  81 bpm    Rating of Perceived Exertion (Exercise)  13    Symptoms  none    Duration  Continue with 30 min of aerobic exercise without signs/symptoms of physical distress.    Intensity  THRR unchanged      Progression   Progression  Continue to progress workloads to maintain intensity without signs/symptoms of physical distress.    Average METs  2.99      Resistance Training   Training Prescription  Yes    Weight  4 lbs    Reps  10-15      Interval Training   Interval Training  No      Treadmill   MPH  2.2    Grade  1    Minutes  15    METs  2.99      NuStep   Level  4    Minutes  15    METs  3      Home Exercise Plan   Plans to continue exercise at  [facility]    Frequency  Add 2 additional days to program exercise sessions.    Initial Home Exercises Provided  02/12/18       Nutrition:  Target Goals: Understanding of nutrition guidelines, daily intake of sodium <1511m, cholesterol <2068m calories 30% from fat and 7% or less from saturated fats, daily to have 5 or more servings of fruits and vegetables.  Biometrics: Pre Biometrics - 01/29/18 1437      Pre Biometrics   Height  5' 11.5" (1.816 m)    Weight  212 lb (96.2 kg)    Waist Circumference  42 inches    Hip Circumference  40 inches    Waist to Hip Ratio  1.05 %    BMI (Calculated)  29.16    Single Leg Stand  5.48 seconds        Nutrition Therapy Plan and Nutrition Goals: Nutrition Therapy & Goals - 03/30/18 1012      Nutrition Therapy   Diet  Heart Healthy Low Sodium Diet 1750-1950kcal (wst: 42 inches!; ref<40inches)(BMI: 29.46)    Protein (specify units)  75-80g    Fiber  30 grams    Whole Grain Foods  3 servings    Saturated Fats  12 max. grams    Fruits and Vegetables  5 servings/day    Sodium  1.5 grams      Personal Nutrition Goals    Nutrition Goal  ST: increase protein by 1/2-1 serving/ day LT: feel better and gain more energy    Comments  Vegetables 3 servings per day, not making much progress with increasing fruit intake; told him that increasing either fruit or vegetable intake would be fine, if increasing his vegetable intake is easier he should do that. He notes that he has a little bit of oatmeal but not much, and will choose whole wheat bread, but doesn't eat many whole grains. Pt reports normally not eating much; boiled eggw/ slice of toast, chicken w/ green beans, and then a peanut butter cracker for dinner.  Intervention Plan   Intervention  Prescribe, educate and counsel regarding individualized specific dietary modifications aiming towards targeted core components such as weight, hypertension, lipid management, diabetes, heart failure and other comorbidities.    Expected Outcomes  Short Term Goal: Understand basic principles of dietary content, such as calories, fat, sodium, cholesterol and nutrients.;Short Term Goal: A plan has been developed with personal nutrition goals set during dietitian appointment.;Long Term Goal: Adherence to prescribed nutrition plan.       Nutrition Assessments: Nutrition Assessments - 01/29/18 1446      MEDFICTS Scores   Pre Score  53       Nutrition Goals Re-Evaluation: Nutrition Goals Re-Evaluation    Row Name 02/19/18 1134 03/19/18 1258 07/22/18 0857         Goals   Current Weight  214 lb (97.1 kg)  -  -     Nutrition Goal  Martin Adkins eats plenty of frozen vegetables, some fruits, poultry, and fish. Martin Adkins uses a No Salt (substitute for Salt).  Heart Healthy diet  ST: work on adding one F or different V serving a day LT: feel better and gain more energy     Comment  Encouraged Martin Adkins to bring any nutrition questions he has to class or come by after graduation within new questions. He sees a registered dietitain at the [facility] regularly.  Martin Adkins continues to eat lots of vegetables  and a few fruits. He sticks to his whole grains and lean meats. Overall, he feels that he has a good grasp on his diet.  Martin Adkins continues to eat vegetables and a few fruits, talked about trying to incorporate a fruit that he enjoys, but we could add one more different vegetable serving if he cannot find one he enjoys to have in his diet. He sticks to his whole grains and lean meats, he has increased his protein and eating beans, fish, and lean poultry without the skin. Overall, he feels that he has a good grasp on his diet.     Expected Outcome  Short: Continue eating a heart healthy diet. Long: Eat plenty of fruits and vegetables. Consult with Dietitian here or at the [facility]. Continue using salt substitute.  Short: Try more fruits for variety  Long: Continue to follow healthy eating plan  Short: Try more fruits or different vegetable for variety  Long: Continue to follow healthy eating plan        Nutrition Goals Discharge (Final Nutrition Goals Re-Evaluation): Nutrition Goals Re-Evaluation - 07/22/18 0857      Goals   Nutrition Goal  ST: work on adding one F or different V serving a day LT: feel better and gain more energy    Comment  Martin Adkins continues to eat vegetables and a few fruits, talked about trying to incorporate a fruit that he enjoys, but we could add one more different vegetable serving if he cannot find one he enjoys to have in his diet. He sticks to his whole grains and lean meats, he has increased his protein and eating beans, fish, and lean poultry without the skin. Overall, he feels that he has a good grasp on his diet.    Expected Outcome  Short: Try more fruits or different vegetable for variety  Long: Continue to follow healthy eating plan       Psychosocial: Target Goals: Acknowledge presence or absence of significant depression and/or stress, maximize coping skills, provide positive support system. Participant is able to verbalize types and ability to use  techniques and skills  needed for reducing stress and depression.   Initial Review & Psychosocial Screening: Initial Psych Review & Screening - 02/19/18 0946      Initial Review   Current issues with  --    Source of Stress Concerns  Chronic Illness    Comments  Error: Notes should be in Re-Eval.       Family Dynamics   Good Support System?  Yes       Quality of Life Scores:  Quality of Life - 01/29/18 1443      Quality of Life Scores   Health/Function Pre  16.17 %    Socioeconomic Pre  19.64 %    Psych/Spiritual Pre  26 %    Family Pre  4.5 %    GLOBAL Pre  16.92 %      Scores of 19 and below usually indicate a poorer quality of life in these areas.  A difference of  2-3 points is a clinically meaningful difference.  A difference of 2-3 points in the total score of the Quality of Life Index has been associated with significant improvement in overall quality of life, self-image, physical symptoms, and general health in studies assessing change in quality of life.  PHQ-9: Recent Review Flowsheet Data    Depression screen [facility] 2/9 01/29/2018 01/29/2018 11/16/2014 09/26/2014   Decreased Interest - 0 2 1   Down, Depressed, Hopeless 2 0 0 0   PHQ - 2 Score 2 0 2 1   Altered sleeping - 0 0 1   Tired, decreased energy 3 0 2 1   Change in appetite - 0 0 0   Feeling bad or failure about yourself  - 0 0 0   Trouble concentrating - 0 - 0   Moving slowly or fidgety/restless - 0 0 0   Suicidal thoughts - 0 0 0   PHQ-9 Score - 0 4 3   Difficult doing work/chores Somewhat difficult - Somewhat difficult Somewhat difficult     Interpretation of Total Score  Total Score Depression Severity:  1-4 = Minimal depression, 5-9 = Mild depression, 10-14 = Moderate depression, 15-19 = Moderately severe depression, 20-27 = Severe depression   Psychosocial Evaluation and Intervention: Psychosocial Evaluation - 02/03/18 0906      Psychosocial Evaluation & Interventions   Interventions  Encouraged to exercise with the  program and follow exercise prescription    Comments  Mr. Westfall (Martin Adkins) returned to this program after several years due to a recent "mild heart attack," which is being treated with Rx, Exercise and diet.  He is a 73 year old who has a good support system with a daughter, son and brother who live locally.  He is in good health mostly except his arthritis and his heart condition.  Martin Adkins reports sleeping well and has a good appetite.  He denies a history of depression or anxiety and reports typically being in a positive mood.  Other than his health, he states nothing else is stressful for him currently.  Martin Adkins has goals to increase his stamina and strength and be able to maintain this with consistent exercise.  He plans to return to [employer] here at [facility] gym following completion of this program.  Staff will follow with Martin Adkins.    Expected Outcomes  Short:  [name] will exercise consistently to regain his stamina and strength and be more heart healthy.  Long:  [name] will maintain his exercise routine for his overall health.  Continue Psychosocial Services   Follow up required by staff       Psychosocial Re-Evaluation: Psychosocial Re-Evaluation    Nimrod Name 02/19/18 1125 03/19/18 1255           Psychosocial Re-Evaluation   Current issues with  Current Sleep Concerns;Current Stress Concerns  Current Sleep Concerns;Current Stress Concerns      Comments  Martin Adkins reports sleeping well, average of 8 hours per night. When he does wake up to go to the bathroom, he is able to go right back to sleep. He is stressed by his current health concerns but is being proactive in managing his controllable factors.  [name] has been doing well in rehab. His back and hip pain have limited his ability to do as much as he would like, but this is getting better.  He sleeps well and dreams a lot.  He always feels better after his exercise days.  He tries to not let things get to him too much.       Expected Outcomes  Continue to use  stress management tools, encouraged to use relaxation, and communicate needs to health care providers.  Short: Continue to work through hip and back pain.  Long: Continue to stay positive.       Interventions  -  Encouraged to attend Cardiac Rehabilitation for the exercise      Continue Psychosocial Services   -  Follow up required by staff         Psychosocial Discharge (Final Psychosocial Re-Evaluation): Psychosocial Re-Evaluation - 03/19/18 1255      Psychosocial Re-Evaluation   Current issues with  Current Sleep Concerns;Current Stress Concerns    Comments  [name] has been doing well in rehab. His back and hip pain have limited his ability to do as much as he would like, but this is getting better.  He sleeps well and dreams a lot.  He always feels better after his exercise days.  He tries to not let things get to him too much.     Expected Outcomes  Short: Continue to work through hip and back pain.  Long: Continue to stay positive.     Interventions  Encouraged to attend Cardiac Rehabilitation for the exercise    Continue Psychosocial Services   Follow up required by staff       Vocational Rehabilitation: Provide vocational rehab assistance to qualifying candidates.   Vocational Rehab Evaluation & Intervention: Vocational Rehab - 01/29/18 1446      Initial Vocational Rehab Evaluation & Intervention   Assessment shows need for Vocational Rehabilitation  No       Education: Education Goals: Education classes will be provided on a variety of topics geared toward better understanding of heart health and risk factor modification. Participant will state understanding/return demonstration of topics presented as noted by education test scores.  Learning Barriers/Preferences: Learning Barriers/Preferences - 01/29/18 1446      Learning Barriers/Preferences   Learning Barriers  None    Learning Preferences  None       Education Topics:  AED/CPR: - Group verbal and written  instruction with the use of models to demonstrate the basic use of the AED with the basic ABC's of resuscitation.   General Nutrition Guidelines/Fats and Fiber: -Group instruction provided by verbal, written material, models and posters to present the general guidelines for heart healthy nutrition. Gives an explanation and review of dietary fats and fiber.   Cardiac Rehab from 11/21/2014 in [facility] Cardiac and Pulmonary Rehab  Date  10/17/14  Educator  PI  Instruction Review Code (retired)  2- meets goals/outcomes      Controlling Sodium/Reading Food Labels: -Group verbal and written material supporting the discussion of sodium use in heart healthy nutrition. Review and explanation with models, verbal and written materials for utilization of the food label.   Cardiac Rehab from 03/19/2018 in [facility] Cardiac and Pulmonary Rehab  Date  03/19/18  Educator  [facility]  Instruction Review Code  5- Refused Teaching      Exercise Physiology & General Exercise Guidelines: - Group verbal and written instruction with models to review the exercise physiology of the cardiovascular system and associated critical values. Provides general exercise guidelines with specific guidelines to those with heart or lung disease.    Cardiac Rehab from 11/21/2014 in [facility] Cardiac and Pulmonary Rehab  Date  11/09/14  Educator  RM  Instruction Review Code (retired)  2- meets goals/outcomes      Aerobic Exercise & Resistance Training: - Gives group verbal and written instruction on the various components of exercise. Focuses on aerobic and resistive training programs and the benefits of this training and how to safely progress through these programs..   Cardiac Rehab from 11/21/2014 in [facility] Cardiac and Pulmonary Rehab  Date  11/14/14  Educator  RM  Instruction Review Code (retired)  2- Statistician, Balance, Mind/Body Relaxation: Provides group verbal/written instruction on the benefits of  flexibility and balance training, including mind/body exercise modes such as yoga, pilates and tai chi.  Demonstration and skill practice provided.   Cardiac Rehab from 03/19/2018 in [facility] Cardiac and Pulmonary Rehab  Date  02/03/18  Educator  AS  Instruction Review Code  1- Verbalizes Understanding      Stress and Anxiety: - Provides group verbal and written instruction about the health risks of elevated stress and causes of high stress.  Discuss the correlation between heart/lung disease and anxiety and treatment options. Review healthy ways to manage with stress and anxiety.   Cardiac Rehab from 03/19/2018 in [facility] Cardiac and Pulmonary Rehab  Date  02/24/18  Educator  [facility]  Instruction Review Code  5- Refused Teaching      Depression: - Provides group verbal and written instruction on the correlation between heart/lung disease and depressed mood, treatment options, and the stigmas associated with seeking treatment.   Cardiac Rehab from 03/19/2018 in [facility] Cardiac and Pulmonary Rehab  Date  03/10/18  Educator  [facility]  Instruction Review Code  5- Refused Teaching      Anatomy & Physiology of the Heart: - Group verbal and written instruction and models provide basic cardiac anatomy and physiology, with the coronary electrical and arterial systems. Review of Valvular disease and Heart Failure   Cardiac Rehab from 03/19/2018 in [facility] Cardiac and Pulmonary Rehab  Date  02/12/18  Educator  CE  Instruction Review Code  5- Refused Teaching      Cardiac Procedures: - Group verbal and written instruction to review commonly prescribed medications for heart disease. Reviews the medication, class of the drug, and side effects. Includes the steps to properly store meds and maintain the prescription regimen. (beta blockers and nitrates)   Cardiac Rehab from 03/19/2018 in [facility] Cardiac and Pulmonary Rehab  Date  02/26/18  Educator  CE  Instruction Review Code  5- Refused Teaching      Cardiac  Medications I: - Group verbal and written instruction to review commonly prescribed medications for heart disease. Reviews  the medication, class of the drug, and side effects. Includes the steps to properly store meds and maintain the prescription regimen.   Cardiac Rehab from 03/19/2018 in [facility] Cardiac and Pulmonary Rehab  Date  02/17/18  Educator  SB  Instruction Review Code  1- Verbalizes Understanding      Cardiac Medications II: -Group verbal and written instruction to review commonly prescribed medications for heart disease. Reviews the medication, class of the drug, and side effects. (all other drug classes)   Cardiac Rehab from 03/19/2018 in [facility] Cardiac and Pulmonary Rehab  Date  02/05/18  Educator  CE  Instruction Review Code  1- Verbalizes Understanding       Go Sex-Intimacy & Heart Disease, Get SMART - Goal Setting: - Group verbal and written instruction through game format to discuss heart disease and the return to sexual intimacy. Provides group verbal and written material to discuss and apply goal setting through the application of the S.M.A.R.T. Method.   Cardiac Rehab from 03/19/2018 in [facility] Cardiac and Pulmonary Rehab  Date  02/26/18  Educator  CE  Instruction Review Code  5- Refused Teaching      Other Matters of the Heart: - Provides group verbal, written materials and models to describe Stable Angina and Peripheral Artery. Includes description of the disease process and treatment options available to the cardiac patient.   Cardiac Rehab from 03/19/2018 in [facility] Cardiac and Pulmonary Rehab  Date  02/12/18  Educator  CE  Instruction Review Code  1- Verbalizes Understanding      Exercise & Equipment Safety: - Individual verbal instruction and demonstration of equipment use and safety with use of the equipment.   Cardiac Rehab from 03/19/2018 in [facility] Cardiac and Pulmonary Rehab  Date  01/29/18  Educator  sb  Instruction Review Code  1- Verbalizes Understanding       Infection Prevention: - Provides verbal and written material to individual with discussion of infection control including proper hand washing and proper equipment cleaning during exercise session.   Cardiac Rehab from 03/19/2018 in [facility] Cardiac and Pulmonary Rehab  Date  01/29/18  Educator  SB1  Instruction Review Code  1- Verbalizes Understanding      Falls Prevention: - Provides verbal and written material to individual with discussion of falls prevention and safety.   Cardiac Rehab from 03/19/2018 in [facility] Cardiac and Pulmonary Rehab  Date  01/29/18  Educator  sb  Instruction Review Code  1- Verbalizes Understanding      Diabetes: - Individual verbal and written instruction to review signs/symptoms of diabetes, desired ranges of glucose level fasting, after meals and with exercise. Acknowledge that pre and post exercise glucose checks will be done for 3 sessions at entry of program.   Know Your Numbers and Risk Factors: -Group verbal and written instruction about important numbers in your health.  Discussion of what are risk factors and how they play a role in the disease process.  Review of Cholesterol, Blood Pressure, Diabetes, and BMI and the role they play in your overall health.   Cardiac Rehab from 03/19/2018 in [facility] Cardiac and Pulmonary Rehab  Date  02/05/18  Educator  CE  Instruction Review Code  1- Verbalizes Understanding      Sleep Hygiene: -Provides group verbal and written instruction about how sleep can affect your health.  Define sleep hygiene, discuss sleep cycles and impact of sleep habits. Review good sleep hygiene tips.    Cardiac Rehab from 03/19/2018 in [facility] Cardiac and  Pulmonary Rehab  Date  02/10/18  Educator  CE  Instruction Review Code  5- Refused Teaching      Other: -Provides group and verbal instruction on various topics (see comments)   Knowledge Questionnaire Score: Knowledge Questionnaire Score - 01/29/18 1446      Knowledge  Questionnaire Score   Pre Score  21/26   Reviewed correct responses with Martin Adkins today. He verbalized understanding and had no further questions today      Core Components/Risk Factors/Patient Goals at Admission: Personal Goals and Risk Factors at Admission - 01/29/18 1447      Core Components/Risk Factors/Patient Goals on Admission   Tobacco Cessation  Yes    Number of packs per day  Quit 2 weeks ago Jan 2020    Intervention  Assist the participant in steps to quit. Provide individualized education and counseling about committing to Tobacco Cessation, relapse prevention, and pharmacological support that can be provided by physician.;Advice worker, assist with locating and accessing local/national Quit Smoking programs, and support quit date choice.    Expected Outcomes  Short Term: Will demonstrate readiness to quit, by selecting a quit date.;Long Term: Complete abstinence from all tobacco products for at least 12 months from quit date.;Short Term: Will quit all tobacco product use, adhering to prevention of relapse plan.    Heart Failure  Yes    Intervention  Provide a combined exercise and nutrition program that is supplemented with education, support and counseling about heart failure. Directed toward relieving symptoms such as shortness of breath, decreased exercise tolerance, and extremity edema.    Expected Outcomes  Improve functional capacity of life;Short term: Attendance in program 2-3 days a week with increased exercise capacity. Reported lower sodium intake. Reported increased fruit and vegetable intake. Reports medication compliance.;Short term: Daily weights obtained and reported for increase. Utilizing diuretic protocols set by physician.;Long term: Adoption of self-care skills and reduction of barriers for early signs and symptoms recognition and intervention leading to self-care maintenance.   Spoke to [employer] about daily weights and reporting 2-3 pound increase to  MD. [name] has stopped his LAsix "I had to go all the time" Advised Jope to let his MD know he has stopped the LAsix and to check on the potassuim he is prescribed.;   Hypertension  Yes    Intervention  Provide education on lifestyle modifcations including regular physical activity/exercise, weight management, moderate sodium restriction and increased consumption of fresh fruit, vegetables, and low fat dairy, alcohol moderation, and smoking cessation.;Monitor prescription use compliance.    Expected Outcomes  Short Term: Continued assessment and intervention until BP is < 140/4m HG in hypertensive participants. < 130/813mHG in hypertensive participants with diabetes, heart failure or chronic kidney disease.;Long Term: Maintenance of blood pressure at goal levels.    Lipids  Yes    Intervention  Provide education and support for participant on nutrition & aerobic/resistive exercise along with prescribed medications to achieve LDL <7022mHDL >33m42m  Expected Outcomes  Short Term: Participant states understanding of desired cholesterol values and is compliant with medications prescribed. Participant is following exercise prescription and nutrition guidelines.;Long Term: Cholesterol controlled with medications as prescribed, with individualized exercise RX and with personalized nutrition plan. Value goals: LDL < 70mg48mL > 40 mg.       Core Components/Risk Factors/Patient Goals Review:  Goals and Risk Factor Review    Row Name 02/19/18 1143 03/19/18 1259           Core  Components/Risk Factors/Patient Goals Review   Personal Goals Review  Weight Management/Obesity;Lipids;Hypertension;Heart Failure;Tobacco Cessation;Other  Weight Management/Obesity;Lipids;Hypertension;Heart Failure;Tobacco Cessation;Other      Review  Weight is staying the same. [name] is okay with that for now. He takes his blood pressure and cholesterol medications as prescribed. He checks his blood pressure at home and gets an  average reading of 130/80. He has his bloodwork done regularly at [facility] bc they are watching his platelets. He was on Chemo until his platelets dropped too low and will start chemo again once they return to a normal level. He is smoking 3 cigarettes. He is going to ask his doctor for the patch to quit. He checks his weight and ankles daily for heart failure symptoms.  Martin Adkins's weight continues to stay between 208-210 lbs.  He contnues to check his pressures daily as well.  He denies any symptoms of heart failure.  He continues to smoke currently and is working towards his goal to quit.       Expected Outcomes  Short: Take medications as prescribed, check weight, BP, and decrease tobacco use. Long: Quit smoking with help from the patches. Take meds as prescribed. Keep a check of his numbers from bloodwork for lipids.   Short: Continue to monitor heart failure symptoms  Long: Continue to monitor risk factors.          Core Components/Risk Factors/Patient Goals at Discharge (Final Review):  Goals and Risk Factor Review - 03/19/18 1259      Core Components/Risk Factors/Patient Goals Review   Personal Goals Review  Weight Management/Obesity;Lipids;Hypertension;Heart Failure;Tobacco Cessation;Other    Review  Martin Adkins's weight continues to stay between 208-210 lbs.  He contnues to check his pressures daily as well.  He denies any symptoms of heart failure.  He continues to smoke currently and is working towards his goal to quit.     Expected Outcomes  Short: Continue to monitor heart failure symptoms  Long: Continue to monitor risk factors.        ITP Comments: ITP Comments    Row Name 01/29/18 1424 02/04/18 0935 03/04/18 0541 03/25/18 0932 04/01/18 1225   ITP Comments  Medical review completed . ITP sent to Dr Loleta Chance for review, changes as needed and signature. Documentation of diagnosis can be found in [facility] 01/18/2018  30 Day Review. Continue with ITP unless directed changes per Medical Director  review.  30 Day Review. Continue with ITP unless directed changes per Medical Director review.  Our program is currently closed due to COVID-19.  We are communicating with patient via phone calls and emails.   Martin Adkins called and requested to have his sessions sent to him to send into insurance.  I have printed these off to send out in the mail.     [place] Name 04/01/18 1348 07/22/18 1359         ITP Comments  30 day review completed. Continue with ITP unless directed changes by Medical Director at review  30 day review cycle restarting  after being closed since March 16 because of  Covid 19 pandemic. Program opened to patients on July 6. Not all have returned. ITP updated and sent to Medical Director for review,changes as needed and signature         Comments:

## 2018-07-27 ENCOUNTER — Other Ambulatory Visit: Payer: Self-pay

## 2018-07-27 ENCOUNTER — Encounter: Payer: Medicare PPO | Admitting: *Deleted

## 2018-07-27 DIAGNOSIS — I214 Non-ST elevation (NSTEMI) myocardial infarction: Secondary | ICD-10-CM

## 2018-07-27 NOTE — Progress Notes (Signed)
Daily Session Note  Patient Details  Name: Martin Adkins MRN: [id number] Date of Birth: 24-May-1945 Referring Provider:     Cardiac Rehab from 01/29/2018 in [facility] Cardiac and Pulmonary Rehab  Referring Provider  [name]      Encounter Date: 07/27/2018  Check In: Session Check In - 07/27/18 0854      Check-In   Supervising physician immediately available to respond to emergencies  See telemetry face sheet for immediately available ER MD    Location  [facility]-Cardiac & Pulmonary Rehab    Staff Present  Alberteen Sam, MA, RCEP, CCRP, [place], BS, ACSM CEP, Exercise Physiologist;Susanne Bice, RN, BSN, CCRP    Virtual Visit  No    Medication changes reported      No    Fall or balance concerns reported     No    Warm-up and Cool-down  Performed on first and last piece of equipment    Resistance Training Performed  Yes    VAD Patient?  No    PAD/SET Patient?  No      Pain Assessment   Currently in Pain?  No/denies    Multiple Pain Sites  No          Social History   Tobacco Use  Smoking Status Former Smoker  . Packs/day: 0.50  . Years: 20.00  . Pack years: 10.00  . Types: Cigarettes  Smokeless Tobacco Never Used  Tobacco Comment   Quit     Goals Met:  Independence with exercise equipment Exercise tolerated well No report of cardiac concerns or symptoms Strength training completed today  Goals Unmet:  Not Applicable  Comments: Pt able to follow exercise prescription today without complaint.  Will continue to monitor for progression.    Dr. Emily Filbert is Medical Director for [facility].

## 2018-07-29 ENCOUNTER — Encounter: Payer: Medicare PPO | Admitting: *Deleted

## 2018-07-29 ENCOUNTER — Other Ambulatory Visit: Payer: Self-pay

## 2018-07-29 DIAGNOSIS — I214 Non-ST elevation (NSTEMI) myocardial infarction: Secondary | ICD-10-CM

## 2018-07-29 NOTE — Progress Notes (Signed)
Daily Session Note  Patient Details  Name: DAVIN ARCHULETTA MRN: [id number] Date of Birth: January 09, 1945 Referring Provider:     Cardiac Rehab from 01/29/2018 in [facility] Cardiac and Pulmonary Rehab  Referring Provider  [name]      Encounter Date: 07/29/2018  Check In: Session Check In - 07/29/18 4503      Check-In   Supervising physician immediately available to respond to emergencies  See telemetry face sheet for immediately available ER MD    Location  [facility]-Cardiac & Pulmonary Rehab    Staff Present  Alberteen Sam, MA, RCEP, CCRP, CCET;Joseph Wilmar;Heath Lark, RN, BSN, CCRP    Virtual Visit  No    Medication changes reported      No    Fall or balance concerns reported     No    Warm-up and Cool-down  Performed on first and last piece of equipment    Resistance Training Performed  Yes    VAD Patient?  No    PAD/SET Patient?  No      Pain Assessment   Currently in Pain?  No/denies          Social History   Tobacco Use  Smoking Status Former Smoker  . Packs/day: 0.50  . Years: 20.00  . Pack years: 10.00  . Types: Cigarettes  Smokeless Tobacco Never Used  Tobacco Comment   Quit     Goals Met:  Independence with exercise equipment Exercise tolerated well No report of cardiac concerns or symptoms Strength training completed today  Goals Unmet:  Not Applicable  Comments: Pt able to follow exercise prescription today without complaint.  Will continue to monitor for progression.    Dr. Emily Filbert is Medical Director for [facility].

## 2018-08-03 ENCOUNTER — Encounter: Payer: Medicare PPO | Admitting: *Deleted

## 2018-08-03 ENCOUNTER — Other Ambulatory Visit: Payer: Self-pay

## 2018-08-03 DIAGNOSIS — I214 Non-ST elevation (NSTEMI) myocardial infarction: Secondary | ICD-10-CM

## 2018-08-03 NOTE — Progress Notes (Signed)
Daily Session Note  Patient Details  Name: Martin Adkins MRN: [id number] Date of Birth: 08-27-1945 Referring Provider:     Cardiac Rehab from 01/29/2018 in [facility] Cardiac and Pulmonary Rehab  Referring Provider  [name]      Encounter Date: 08/03/2018  Check In: Session Check In - 08/03/18 0819      Check-In   Supervising physician immediately available to respond to emergencies  See telemetry face sheet for immediately available ER MD    Location  [facility]-Cardiac & Pulmonary Rehab    Staff Present  Earlean Shawl, BS, ACSM CEP, Exercise Physiologist;Joseph Flavia Shipper;Heath Lark, RN, BSN, CCRP    Virtual Visit  No    Medication changes reported      No    Fall or balance concerns reported     No    Warm-up and Cool-down  Performed on first and last piece of equipment    Resistance Training Performed  Yes    VAD Patient?  No    PAD/SET Patient?  No      Pain Assessment   Currently in Pain?  No/denies    Multiple Pain Sites  No          Social History   Tobacco Use  Smoking Status Former Smoker  . Packs/day: 0.50  . Years: 20.00  . Pack years: 10.00  . Types: Cigarettes  Smokeless Tobacco Never Used  Tobacco Comment   Quit     Goals Met:  Independence with exercise equipment Exercise tolerated well Personal goals reviewed No report of cardiac concerns or symptoms Strength training completed today  Goals Unmet:  Not Applicable  Comments: Pt able to follow exercise prescription today without complaint.  Will continue to monitor for progression.    Dr. Emily Filbert is Medical Director for [place] and [facility].

## 2018-08-05 ENCOUNTER — Other Ambulatory Visit: Payer: Self-pay

## 2018-08-05 ENCOUNTER — Encounter: Payer: Medicare PPO | Admitting: *Deleted

## 2018-08-05 DIAGNOSIS — I214 Non-ST elevation (NSTEMI) myocardial infarction: Secondary | ICD-10-CM | POA: Diagnosis not present

## 2018-08-05 NOTE — Progress Notes (Signed)
Daily Session Note  Patient Details  Name: Martin Adkins MRN: [id number] Date of Birth: 1945-11-26 Referring Provider:     Cardiac Rehab from 01/29/2018 in [facility] Cardiac and Pulmonary Rehab  Referring Provider  [name]      Encounter Date: 08/05/2018  Check In: Session Check In - 08/05/18 0851      Check-In   Supervising physician immediately available to respond to emergencies  See telemetry face sheet for immediately available ER MD    Location  [facility]-Cardiac & Pulmonary Rehab    Staff Present  Alberteen Sam, MA, RCEP, CCRP, CCET;Joseph Lorane;Heath Lark, RN, BSN, CCRP    Medication changes reported      No    Fall or balance concerns reported     No    Warm-up and Cool-down  Performed on first and last piece of equipment    Resistance Training Performed  Yes    VAD Patient?  No    PAD/SET Patient?  No      Pain Assessment   Currently in Pain?  No/denies          Social History   Tobacco Use  Smoking Status Former Smoker  . Packs/day: 0.50  . Years: 20.00  . Pack years: 10.00  . Types: Cigarettes  Smokeless Tobacco Never Used  Tobacco Comment   Quit     Goals Met:  Independence with exercise equipment Exercise tolerated well No report of cardiac concerns or symptoms Strength training completed today  Goals Unmet:  Not Applicable  Comments: Pt able to follow exercise prescription today without complaint.  Will continue to monitor for progression.    Dr. Emily Filbert is Medical Director for [place] and [facility].

## 2018-08-06 ENCOUNTER — Inpatient Hospital Stay (HOSPITAL_BASED_OUTPATIENT_CLINIC_OR_DEPARTMENT_OTHER): Payer: Medicare PPO | Admitting: Oncology

## 2018-08-06 ENCOUNTER — Encounter: Payer: Self-pay | Admitting: Oncology

## 2018-08-06 ENCOUNTER — Inpatient Hospital Stay: Payer: Medicare PPO | Attending: Oncology

## 2018-08-06 ENCOUNTER — Other Ambulatory Visit: Payer: Self-pay

## 2018-08-06 VITALS — BP 144/89 | HR 72 | Temp 98.3°F | Wt 209.5 lb

## 2018-08-06 DIAGNOSIS — Z8041 Family history of malignant neoplasm of ovary: Secondary | ICD-10-CM | POA: Diagnosis not present

## 2018-08-06 DIAGNOSIS — E785 Hyperlipidemia, unspecified: Secondary | ICD-10-CM | POA: Insufficient documentation

## 2018-08-06 DIAGNOSIS — D6861 Antiphospholipid syndrome: Secondary | ICD-10-CM | POA: Diagnosis not present

## 2018-08-06 DIAGNOSIS — M069 Rheumatoid arthritis, unspecified: Secondary | ICD-10-CM | POA: Diagnosis not present

## 2018-08-06 DIAGNOSIS — N400 Enlarged prostate without lower urinary tract symptoms: Secondary | ICD-10-CM | POA: Insufficient documentation

## 2018-08-06 DIAGNOSIS — C9111 Chronic lymphocytic leukemia of B-cell type in remission: Secondary | ICD-10-CM

## 2018-08-06 DIAGNOSIS — Z803 Family history of malignant neoplasm of breast: Secondary | ICD-10-CM | POA: Diagnosis not present

## 2018-08-06 DIAGNOSIS — I2510 Atherosclerotic heart disease of native coronary artery without angina pectoris: Secondary | ICD-10-CM

## 2018-08-06 DIAGNOSIS — I4891 Unspecified atrial fibrillation: Secondary | ICD-10-CM | POA: Insufficient documentation

## 2018-08-06 DIAGNOSIS — Z7982 Long term (current) use of aspirin: Secondary | ICD-10-CM

## 2018-08-06 DIAGNOSIS — C9110 Chronic lymphocytic leukemia of B-cell type not having achieved remission: Secondary | ICD-10-CM

## 2018-08-06 DIAGNOSIS — K219 Gastro-esophageal reflux disease without esophagitis: Secondary | ICD-10-CM | POA: Insufficient documentation

## 2018-08-06 DIAGNOSIS — D696 Thrombocytopenia, unspecified: Secondary | ICD-10-CM | POA: Diagnosis not present

## 2018-08-06 DIAGNOSIS — E538 Deficiency of other specified B group vitamins: Secondary | ICD-10-CM

## 2018-08-06 DIAGNOSIS — I509 Heart failure, unspecified: Secondary | ICD-10-CM | POA: Diagnosis not present

## 2018-08-06 DIAGNOSIS — Z87891 Personal history of nicotine dependence: Secondary | ICD-10-CM

## 2018-08-06 DIAGNOSIS — Z79899 Other long term (current) drug therapy: Secondary | ICD-10-CM

## 2018-08-06 DIAGNOSIS — Z7901 Long term (current) use of anticoagulants: Secondary | ICD-10-CM

## 2018-08-06 DIAGNOSIS — Z86718 Personal history of other venous thrombosis and embolism: Secondary | ICD-10-CM | POA: Insufficient documentation

## 2018-08-06 DIAGNOSIS — I739 Peripheral vascular disease, unspecified: Secondary | ICD-10-CM

## 2018-08-06 DIAGNOSIS — G4700 Insomnia, unspecified: Secondary | ICD-10-CM

## 2018-08-06 DIAGNOSIS — I10 Essential (primary) hypertension: Secondary | ICD-10-CM | POA: Insufficient documentation

## 2018-08-06 DIAGNOSIS — I252 Old myocardial infarction: Secondary | ICD-10-CM

## 2018-08-06 DIAGNOSIS — R161 Splenomegaly, not elsewhere classified: Secondary | ICD-10-CM | POA: Insufficient documentation

## 2018-08-06 LAB — CBC WITH DIFFERENTIAL/PLATELET
Abs Immature Granulocytes: 0 10*3/uL (ref 0.00–0.07)
Basophils Absolute: 0 10*3/uL (ref 0.0–0.1)
Basophils Relative: 1 %
Eosinophils Absolute: 0.1 10*3/uL (ref 0.0–0.5)
Eosinophils Relative: 2 %
HCT: 42.9 % (ref 39.0–52.0)
Hemoglobin: 14.1 g/dL (ref 13.0–17.0)
Immature Granulocytes: 0 %
Lymphocytes Relative: 24 %
Lymphs Abs: 0.9 10*3/uL (ref 0.7–4.0)
MCH: 30.3 pg (ref 26.0–34.0)
MCHC: 32.9 g/dL (ref 30.0–36.0)
MCV: 92.3 fL (ref 80.0–100.0)
Monocytes Absolute: 0.4 10*3/uL (ref 0.1–1.0)
Monocytes Relative: 10 %
Neutro Abs: 2.3 10*3/uL (ref 1.7–7.7)
Neutrophils Relative %: 63 %
Platelets: 102 10*3/uL — ABNORMAL LOW (ref 150–400)
RBC: 4.65 MIL/uL (ref 4.22–5.81)
RDW: 13.5 % (ref 11.5–15.5)
WBC: 3.6 10*3/uL — ABNORMAL LOW (ref 4.0–10.5)
nRBC: 0 % (ref 0.0–0.2)

## 2018-08-06 LAB — COMPREHENSIVE METABOLIC PANEL
ALT: 25 U/L (ref 0–44)
AST: 30 U/L (ref 15–41)
Albumin: 4.1 g/dL (ref 3.5–5.0)
Alkaline Phosphatase: 92 U/L (ref 38–126)
Anion gap: 8 (ref 5–15)
BUN: 17 mg/dL (ref 8–23)
CO2: 22 mmol/L (ref 22–32)
Calcium: 8.9 mg/dL (ref 8.9–10.3)
Chloride: 107 mmol/L (ref 98–111)
Creatinine, Ser: 1 mg/dL (ref 0.61–1.24)
GFR calc Af Amer: >60 mL/min (ref >60)
GFR calc non Af Amer: >60 mL/min (ref >60)
Glucose, Bld: 119 mg/dL — ABNORMAL HIGH (ref 70–99)
Potassium: 4.1 mmol/L (ref 3.5–5.1)
Sodium: 137 mmol/L (ref 135–145)
Total Bilirubin: 1.4 mg/dL — ABNORMAL HIGH (ref 0.3–1.2)
Total Protein: 7.3 g/dL (ref 6.5–8.1)

## 2018-08-06 LAB — LACTATE DEHYDROGENASE: LDH: 123 U/L (ref 98–192)

## 2018-08-06 NOTE — Progress Notes (Signed)
Hematology/Oncology Follow up note [facility] Telephone:(336) [phone] [phone]   Patient Care Team: [name], MD as PCP - General (Internal Medicine) Earlie Server, MD as Medical Oncologist (Medical Oncology)  REFERRING PROVIDER: [name], MD  REASON FOR VISIT Follow up for management of CLL HISTORY OF PRESENTING ILLNESS:  Martin Adkins is a  73 y.o.  male with PMH listed below who was referred to me for evaluation of lymphocytosis.  Recent lab work on 03/05/2017 showed wbc 11.4, hb 14.6, platelet count 117,000, lymphocytosis 63.5%, neutrophil 22%, Report chronic fatigue, no weight loss, fever or chills.  He reports feeling chest "rattleness". Has for CT chest which showed acute finding, chronic finding includes  postsurgical changes consistent with coronary bypass grafting. One of the bypass grafts arising from the aorta shows apparent thrombosis and an area of distal aneurysmal dilatation which is also Thrombosed. Right adrenal adenoma stable in appearance.Mild scarring in the lung bases.Fatty liver.   Takes Aspirin 24m, coumadine, plavix. He is on anticoagulation for previous history of clots.  Denies any bleeding events. Use to drink hard liquir daily, quitted for 4-5 years. Current drinks wine on weekend.    Image Studies # 04/04/2017  [place]dom[place] showed 1.  Splenomegaly.  No focal splenic lesions evident.2. Increased liver echogenicity, a finding most likely indicative of hepatic steatosis. While no focal liver lesions are evident on this study, it must be cautioned that the sensitivity of ultrasound for detection of focal liver lesions is diminished in this circumstance.3. Pancreas obscured by gas. Portions of inferior vena cava obscured by gas.4.  Gallbladder absent.5. Status post abdominal aortic aneurysm repair. No periaortic fluid.  # 04/21/2017 PET scan 1. Moderate splenomegaly with uniform splenic hypermetabolism,compatible  with the provided history of lymphoproliferative disease.No additional hypermetabolic sites of lymphoproliferative disease. Specifically, no hypermetabolic lymphadenopathy Hepatic steatosis, aortic atherosclerosis. Aneurysm.    # CLL, stage IV disease given spelencemagly, thrombocytopenia, symptomatic with fatigue and weight loss.  CLL IPL score 4, high risk.   # status post abdominal aortic aneurysm repair on 09/25/2017 s/p cardiac cath which revealed patent LIMA to the LAD, no significant disease in the RCA with a 90% stenosis in a small RV marginal branch.  Occluded left circumflex, occluded LAD.  Saphenous vein grafts are all occluded.  RCA does not require grafting.  LAD is well grafted by the left internal mammary.  Medical management  INTERVAL HISTORY Martin JKIMON LOEWENis a 73y.o. male who has above presents for assessing toxicities from Venetoclax for treatment for CLL.  He has been off venetoclax since 11/11/2017. Patient reports doing pretty well.  Denies any fever, chills, unintentional weight loss, night sweating. Chronic fatigue at baseline. No recent infections. . Review of Systems  Constitutional: Negative for appetite change, chills, fatigue, fever and unexpected weight change.  HENT:   Negative for hearing loss and voice change.   Eyes: Negative for eye problems and icterus.  Respiratory: Negative for chest tightness, cough, shortness of breath and wheezing.   Cardiovascular: Negative for chest pain and leg swelling.  Gastrointestinal: Negative for abdominal distention and abdominal pain.  Endocrine: Negative for hot flashes.  Genitourinary: Negative for difficulty urinating, dysuria and frequency.   Musculoskeletal: Negative for arthralgias and back pain.  Skin: Negative for itching and rash.  Neurological: Negative for light-headedness and numbness.  Hematological: Negative for adenopathy. Does not bruise/bleed easily.  Psychiatric/Behavioral: Negative for confusion.      MEDICAL HISTORY:  Past Medical  History:  Diagnosis Date  . AAA (abdominal aortic aneurysm) without rupture ([place]) 04/05/2014  . AAA (abdominal aortic aneurysm) without rupture ([place]) 04/05/2014  . Abdominal aortic aneurysm without rupture ([place])   . Acute non-ST elevation myocardial infarction (NSTEMI) ([place]) 08/15/2014  . 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/28/2014  . APS (antiphospholipid syndrome) ([place])   . Arteriosclerosis of coronary artery 04/05/2014   Overview:  Sp cabg with lima to lad svg to d1 om1 rca 2004 with occluded svg to rca and d1 and pci stetn of om1 graft 2015   . Arthritis   . B12 deficiency 03/21/2017  . Barrett's esophagus   . Benign essential HTN 05/02/2014  . BPH (benign prostatic hyperplasia)   . CAD (coronary artery disease)   . CHF (congestive heart failure) ([place])   . Chronic systolic heart failure ([place]) 04/19/2014   Overview:  With segmental LV systolic dysfunction ejection fraction of 35%   . CLL (chronic lymphocytic leukemia) ([place])   . CLL (chronic lymphocytic leukemia) ([place]) 05/24/2017  . DVT (deep venous thrombosis) ([place])   . Dysrhythmia    ATRIAL FIB.  Marland Kitchen GERD (gastroesophageal reflux disease)   . History of hiatal hernia   . History of shingles 2013  . Hyperlipemia   . Hyperplastic polyps of stomach   . Hypertension   . Myocardial infarction ([place]) 07/30/2014  . NSTEMI (non-ST elevated myocardial infarction) ([place])   . OSA (obstructive sleep apnea)   . Osteoarthritis   . Pre-diabetes   . PVD (peripheral vascular disease) ([place])   . RA (rheumatoid arthritis) ([place])   . SOB (shortness of breath)     SURGICAL HISTORY: Past Surgical History:  Procedure Laterality Date  . ABDOMINAL AORTA STENT    . CHOLECYSTECTOMY    . COLONOSCOPY  11/24/2009  . CORONARY ANGIOPLASTY WITH STENT PLACEMENT  2015  . CORONARY ARTERY BYPASS GRAFT    . ENDOVASCULAR REPAIR/STENT GRAFT N/A 09/24/2017   Procedure: ENDOVASCULAR REPAIR/STENT GRAFT;  Surgeon: Algernon Huxley, MD;   Location: [place] [facility];  Service: Cardiovascular;  Laterality: N/A;  . ESOPHAGOGASTRODUODENOSCOPY  05/26/02  03/23/12  . ESOPHAGOGASTRODUODENOSCOPY (EGD) WITH PROPOFOL N/A 10/28/2016   Procedure: ESOPHAGOGASTRODUODENOSCOPY (EGD) WITH PROPOFOL;  Surgeon: Manya Silvas, MD;  Location[facility] ENDOSCOPY;  Service: Endoscopy;  Laterality: N/A;  . LEFT HEART CATH AND CORONARY ANGIOGRAPHY N/A 01/19/2018   Procedure: LEFT HEART CATH AND CORONARY ANGIOGRAPHY;  Surgeon: Teodoro Spray, MD;  Location: [place] [facility];  Service: Cardiovascular;  Laterality: N/A;  . PERIPHERAL VASCULAR CATHETERIZATION N/A 09/06/2014   Procedure: IVC Filter Removal;  Surgeon: Katha Cabal, MD;  Location: [place] [facility];  Service: Cardiovascular;  Laterality: N/A;  . TRANSURETHRAL RESECTION OF PROSTATE N/A 02/20/2015   Procedure: TRANSURETHRAL RESECTION OF THE PROSTATE (TURP);  Surgeon: Hollice Espy, MD;  Location: [facility] ORS;  Service: Urology;  Laterality: N/A;    SOCIAL HISTORY: Social History   Socioeconomic History  . Marital status: Legally Separated    Spouse name: Not on file  . Number of children: Not on file  . Years of education: Not on file  . Highest education level: Not on file  Occupational History  . Not on file  Social Needs  . Financial resource strain: Not hard at all  . Food insecurity    Worry: Never true    Inability: Never true  . Transportation needs    Medical: No    Non-medical: No  Tobacco Use  . Smoking status: Former  Smoker    Packs/day: 0.50    Years: 20.00    Pack years: 10.00    Types: Cigarettes  . Smokeless tobacco: Never Used  . Tobacco comment: Quit   Substance and Sexual Activity  . Alcohol use: Yes    Alcohol/week: 1.0 - 2.0 standard drinks    Types: 1 - 2 Shots of liquor per week    Comment: social on weekends  . Drug use: No  . Sexual activity: Never  Lifestyle  . Physical activity    Days per week: 7 days    Minutes per session: 40 min   . Stress: Not at all  Relationships  . Social connections    Talks on phone: More than three times a week    Gets together: More than three times a week    Attends religious service: More than 4 times per year    Active member of club or organization: No    Attends meetings of clubs or organizations: Never    Relationship status: Separated  . Intimate partner violence    Fear of current or ex partner: No    Emotionally abused: No    Physically abused: No    Forced sexual activity: No  Other Topics Concern  . Not on file  Social History Narrative  . Not on file    FAMILY HISTORY: Family History  Problem Relation Age of Onset  . Cancer Mother 25       breast ca  . Diabetes Mother   . Coronary artery disease Father   . Heart attack Father   . Prostate cancer Brother   . Bladder Cancer Neg Hx   . Kidney cancer Neg Hx     ALLERGIES:  is allergic to ace inhibitors; pravastatin; rosuvastatin; simvastatin; amoxicillin; niacin; and penicillins.  MEDICATIONS:  Current Outpatient Medications  Medication Sig Dispense Refill  . albuterol (PROVENTIL HFA;VENTOLIN HFA) 108 (90 Base) MCG/ACT inhaler Inhale 2 puffs into the lungs every 6 (six) hours as needed for wheezing or shortness of breath. 1 Inhaler 0  . amLODipine (NORVASC) 10 MG tablet Take 10 mg by mouth daily.     Marland Kitchen atorvastatin (LIPITOR) 80 MG tablet Take 80 mg by mouth daily.   11  . dorzolamide (TRUSOPT) 2 % ophthalmic solution INSTILL ONE DROP IN [facility] EYE TWICE DAILY    . fluticasone (FLONASE) 50 MCG/ACT nasal spray Place 2 sprays into both nostrils daily.     Marland Kitchen gabapentin (NEURONTIN) 300 MG capsule Take 1 capsule (300 mg total) by mouth 3 (three) times daily. (Patient taking differently: Take 300 mg by mouth 2 (two) times daily. ) 90 capsule 0  . hydroxychloroquine (PLAQUENIL) 200 MG tablet Take 200 mg by mouth every other day.     . Ipratropium-Albuterol (COMBIVENT) 20-100 MCG/ACT AERS respimat Inhale 1 puff into the lungs  every 6 (six) hours. 1 Inhaler 11  . isosorbide mononitrate (IMDUR) 60 MG 24 hr tablet Take 1 tablet (60 mg total) by mouth daily. 30 tablet 0  . latanoprost (XALATAN) 0.005 % ophthalmic solution INSTILL ONE DROP IN [facility] EYE AT BEDTIME    . metoprolol succinate (TOPROL-XL) 100 MG 24 hr tablet Take 100 mg by mouth daily.     . montelukast (SINGULAIR) 10 MG tablet Take 1 tablet (10 mg total) by mouth as needed. Take before Gazyva infusion. 20 tablet 0  . pantoprazole (PROTONIX) 40 MG tablet Take 40 mg by mouth daily.     . potassium chloride  SA (K-DUR,KLOR-CON) 20 MEQ tablet Take 20 mEq by mouth daily.     . rivaroxaban (XARELTO) 20 MG TABS tablet Take 1 tablet (20 mg total) by mouth daily with supper. 30 tablet 3  . vitamin B-12 (CYANOCOBALAMIN) 1000 MCG tablet TAKE ONE TABLET BY MOUTH ONCE DAILY 90 tablet 0   No current facility-administered medications for this visit.      PHYSICAL EXAMINATION: ECOG 1 Vitals:   08/06/18 1544  BP: (!) 144/89  Pulse: 72  Temp: 98.3 F (36.8 C)  Physical Exam  Constitutional: He is oriented to person, place, and time. No distress.  HENT:  Head: Normocephalic and atraumatic.  Nose: Nose normal.  Mouth/Throat: Oropharynx is clear and moist. No oropharyngeal exudate.  Eyes: Pupils are equal, round, and reactive to light. EOM are normal. Left eye exhibits no discharge. No scleral icterus.  Neck: Normal range of motion. Neck supple. No JVD present.  Cardiovascular: Normal rate, regular rhythm and normal heart sounds.  No murmur heard. Pulmonary/Chest: Effort normal. No respiratory distress. He has no wheezes. He has no rales. He exhibits no tenderness.  Abdominal: Soft. He exhibits no distension. There is no abdominal tenderness.  Musculoskeletal: Normal range of motion.        General: No deformity or edema.  Lymphadenopathy:    He has no cervical adenopathy.  Neurological: He is alert and oriented to person, place, and time. No cranial nerve deficit.  He exhibits normal muscle tone. Coordination normal.  Skin: Skin is warm and dry. He is not diaphoretic. No erythema.  Psychiatric: Affect normal.  Nursing note and vitals reviewed.    LABORATORY DATA:  I have reviewed the data as listed Lab Results  Component Value Date   WBC 3.6 (L) 08/06/2018   HGB 14.1 08/06/2018   HCT 42.9 08/06/2018   MCV 92.3 08/06/2018   PLT 102 (L) 08/06/2018   Recent Labs    05/11/18 1041 06/30/18 1333 08/06/18 1010  NA 140 138 137  K 3.9 3.7 4.1  CL 107 106 107  CO2 23 21* 22  GLUCOSE 123* 112* 119*  BUN '20 16 17  ' CREATININE 0.90 0.90 1.00  CALCIUM 8.8* 8.9 8.9  GFRNONAA >60 >60 >60  GFRAA >60 >60 >60  PROT 6.7 7.5 7.3  ALBUMIN 4.0 4.4 4.1  AST '24 31 30  ' ALT '21 26 25  ' ALKPHOS 89 102 92  BILITOT 1.7* 1.6* 1.4*   Hepatitis panel negative.  LDH 228, mildly elevated Beta 2 microglobulin normal.   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 The morphologic and immunophenotypic features are most consistent with chronic lymphocytic leukemia/small lymphocytic lymphoma. Bone marrow Cytogenetics : normal.  Peripheral blood FISH panel negative for CCND1- IGH mutation, ATM, 12, 13q, Tp53 mutation.   RADIOGRAPHIC STUDIES: I have personally reviewed the radiological images as listed and agreed with the findings in the report. CT angio abd/pelvis showed Type 1b endoleak, AAA aneurysm, Spleen has decreased in size since PET scan in April 2019.   ASSESSMENT & PLAN:  73 yo male presents for follow-up CLL. 1. CLL (chronic lymphocytic leukemia) ([place])   2. Thrombocytopenia ([place])   3. Long term current use of anticoagulant therapy    #CLL, stable.  Patient has been in remission.  Off treatment since November 2019. Peripheral blood flow cytometry negative for DIF. Continue observation.  # Chronic anticoagulation on Xarelto.  Tolerating well.  Follow-up with cardiology. #Thrombocytopenia, stable. .  Repeat labs  4 months and MD assessment.  Earlie Server, MD, PhD Hematology Oncology [facility] Pager- [phone] 08/06/2018

## 2018-08-06 NOTE — Progress Notes (Signed)
Patient here today for follow up.   

## 2018-08-07 ENCOUNTER — Encounter: Payer: Medicare PPO | Admitting: *Deleted

## 2018-08-07 DIAGNOSIS — I214 Non-ST elevation (NSTEMI) myocardial infarction: Secondary | ICD-10-CM

## 2018-08-07 NOTE — Progress Notes (Signed)
Daily Session Note  Patient Details  Name: Martin Adkins MRN: [id number] Date of Birth: 1945-02-21 Referring Provider:     Cardiac Rehab from 01/29/2018 in [facility] Cardiac and Pulmonary Rehab  Referring Provider  [name]      Encounter Date: 08/07/2018  Check In: Session Check In - 08/07/18 0092      Check-In   Supervising physician immediately available to respond to emergencies  See telemetry face sheet for immediately available ER MD    Location  [facility]-Cardiac & Pulmonary Rehab    Staff Present  Alberteen Sam, MA, RCEP, CCRP, CCET;Joseph [place];Heath Lark, RN, BSN, CCRP    Virtual Visit  No    Medication changes reported      No    Fall or balance concerns reported     No    Warm-up and Cool-down  Performed on first and last piece of equipment    Resistance Training Performed  Yes    VAD Patient?  No    PAD/SET Patient?  No      Pain Assessment   Currently in Pain?  No/denies          Social History   Tobacco Use  Smoking Status Former Smoker  . Packs/day: 0.50  . Years: 20.00  . Pack years: 10.00  . Types: Cigarettes  Smokeless Tobacco Never Used  Tobacco Comment   Quit     Goals Met:  Independence with exercise equipment Exercise tolerated well No report of cardiac concerns or symptoms Strength training completed today  Goals Unmet:  Not Applicable  Comments: Pt able to follow exercise prescription today without complaint.  Will continue to monitor for progression.    Dr. Emily Filbert is Medical Director for [place] and [facility].

## 2018-08-10 ENCOUNTER — Encounter: Payer: Medicare PPO | Attending: Cardiology | Admitting: *Deleted

## 2018-08-10 ENCOUNTER — Other Ambulatory Visit: Payer: Self-pay

## 2018-08-10 DIAGNOSIS — I214 Non-ST elevation (NSTEMI) myocardial infarction: Secondary | ICD-10-CM | POA: Diagnosis not present

## 2018-08-10 NOTE — Progress Notes (Signed)
Daily Session Note  Patient Details  Name: Martin Adkins MRN: [id number] Date of Birth: 07-02-1945 Referring Provider:     Cardiac Rehab from 01/29/2018 in [facility] Cardiac and Pulmonary Rehab  Referring Provider  [name]      Encounter Date: 08/10/2018  Check In: Session Check In - 08/10/18 0817      Check-In   Supervising physician immediately available to respond to emergencies  See telemetry face sheet for immediately available ER MD    Location  [facility]-Cardiac & Pulmonary Rehab    Staff Present  Justin Mend RCP,RRT,BSRT;Heath Lark, RN, BSN, Lance Sell, BA, ACSM CEP, Exercise Physiologist    Virtual Visit  No    Medication changes reported      No    Fall or balance concerns reported     No    Warm-up and Cool-down  Performed on first and last piece of equipment    Resistance Training Performed  Yes    VAD Patient?  No    PAD/SET Patient?  No      Pain Assessment   Currently in Pain?  No/denies          Social History   Tobacco Use  Smoking Status Former Smoker  . Packs/day: 0.50  . Years: 20.00  . Pack years: 10.00  . Types: Cigarettes  Smokeless Tobacco Never Used  Tobacco Comment   Quit     Goals Met:  Independence with exercise equipment Exercise tolerated well No report of cardiac concerns or symptoms Strength training completed today  Goals Unmet:  Not Applicable  Comments: Pt able to follow exercise prescription today without complaint.  Will continue to monitor for progression.    Dr. Emily Filbert is Medical Director for [place] and [facility].

## 2018-08-12 ENCOUNTER — Other Ambulatory Visit: Payer: Self-pay

## 2018-08-12 DIAGNOSIS — I214 Non-ST elevation (NSTEMI) myocardial infarction: Secondary | ICD-10-CM | POA: Diagnosis not present

## 2018-08-12 NOTE — Progress Notes (Signed)
Daily Session Note  Patient Details  Name: Martin Adkins MRN: [id number] Date of Birth: 07/28/1945 Referring Provider:     Cardiac Rehab from 01/29/2018 in [facility] Cardiac and Pulmonary Rehab  Referring Provider  [name]      Encounter Date: 08/12/2018  Check In:      Social History   Tobacco Use  Smoking Status Former Smoker  . Packs/day: 0.50  . Years: 20.00  . Pack years: 10.00  . Types: Cigarettes  Smokeless Tobacco Never Used  Tobacco Comment   Quit     Goals Met:  Independence with exercise equipment Exercise tolerated well No report of cardiac concerns or symptoms Strength training completed today  Goals Unmet:  Not Applicable  Comments: Pt able to follow exercise prescription today without complaint.  Will continue to monitor for progression.    Dr. Mark Miller is Medical Director for [facility] and [facility]. 

## 2018-08-14 ENCOUNTER — Other Ambulatory Visit: Payer: Self-pay

## 2018-08-14 DIAGNOSIS — I214 Non-ST elevation (NSTEMI) myocardial infarction: Secondary | ICD-10-CM | POA: Diagnosis not present

## 2018-08-14 NOTE — Progress Notes (Signed)
Daily Session Note  Patient Details  Name: Martin Adkins MRN: [id number] Date of Birth: 12-31-1945 Referring Provider:     Cardiac Rehab from 01/29/2018 in [facility] Cardiac and Pulmonary Rehab  Referring Provider  [name]      Encounter Date: 08/14/2018  Check In:      Social History   Tobacco Use  Smoking Status Former Smoker  . Packs/day: 0.50  . Years: 20.00  . Pack years: 10.00  . Types: Cigarettes  Smokeless Tobacco Never Used  Tobacco Comment   Quit     Goals Met:  Independence with exercise equipment Exercise tolerated well No report of cardiac concerns or symptoms Strength training completed today  Goals Unmet:  Not Applicable  Comments: Pt able to follow exercise prescription today without complaint.  Will continue to monitor for progression.    Dr. Emily Filbert is Medical Director for [facility] and [facility].

## 2018-08-17 ENCOUNTER — Encounter: Payer: Medicare PPO | Admitting: *Deleted

## 2018-08-17 ENCOUNTER — Other Ambulatory Visit: Payer: Self-pay

## 2018-08-17 DIAGNOSIS — I214 Non-ST elevation (NSTEMI) myocardial infarction: Secondary | ICD-10-CM | POA: Diagnosis not present

## 2018-08-17 NOTE — Progress Notes (Signed)
Daily Session Note  Patient Details  Name: JEHAD BISONO MRN: [id number] Date of Birth: 1945/02/13 Referring Provider:     Cardiac Rehab from 01/29/2018 in [facility] Cardiac and Pulmonary Rehab  Referring Provider  [name]      Encounter Date: 08/17/2018  Check In: Session Check In - 08/17/18 7628      Check-In   Supervising physician immediately available to respond to emergencies  See telemetry face sheet for immediately available ER MD    Location  [facility]-Cardiac & Pulmonary Rehab    Staff Present  Heath Lark, RN, BSN, CCRP;Ashanna Heinsohn [place], MA, RCEP, CCRP, CCET;Joseph [place], [place], ACSM CEP, Exercise Physiologist    Virtual Visit  No    Medication changes reported      No    Fall or balance concerns reported     No    Warm-up and Cool-down  Performed on first and last piece of equipment    Resistance Training Performed  Yes    VAD Patient?  No    PAD/SET Patient?  No      Pain Assessment   Currently in Pain?  No/denies          Social History   Tobacco Use  Smoking Status Former Smoker  . Packs/day: 0.50  . Years: 20.00  . Pack years: 10.00  . Types: Cigarettes  Smokeless Tobacco Never Used  Tobacco Comment   Quit     Goals Met:  Independence with exercise equipment Exercise tolerated well No report of cardiac concerns or symptoms Strength training completed today  Goals Unmet:  Not Applicable  Comments: Pt able to follow exercise prescription today without complaint.  Will continue to monitor for progression.    Dr. Emily Filbert is Medical Director for [place] and [facility].

## 2018-08-19 ENCOUNTER — Encounter: Payer: Medicare PPO | Admitting: *Deleted

## 2018-08-19 ENCOUNTER — Encounter: Payer: Self-pay | Admitting: *Deleted

## 2018-08-19 ENCOUNTER — Ambulatory Visit (INDEPENDENT_AMBULATORY_CARE_PROVIDER_SITE_OTHER): Payer: Medicare PPO | Admitting: Nurse Practitioner

## 2018-08-19 ENCOUNTER — Other Ambulatory Visit: Payer: Self-pay

## 2018-08-19 ENCOUNTER — Encounter (INDEPENDENT_AMBULATORY_CARE_PROVIDER_SITE_OTHER): Payer: Medicare PPO

## 2018-08-19 ENCOUNTER — Other Ambulatory Visit (INDEPENDENT_AMBULATORY_CARE_PROVIDER_SITE_OTHER): Payer: Medicare PPO

## 2018-08-19 DIAGNOSIS — I214 Non-ST elevation (NSTEMI) myocardial infarction: Secondary | ICD-10-CM

## 2018-08-19 NOTE — Progress Notes (Signed)
Cardiac Individual Treatment Plan  Patient Details  Name: Martin Adkins MRN: [id number] Date of Birth: 06/19/1945 Referring Provider:     Cardiac Rehab from 01/29/2018 in [facility] Cardiac and Pulmonary Rehab  Referring Provider  [name]      Initial Encounter Date:    Cardiac Rehab from 01/29/2018 in [facility] Cardiac and Pulmonary Rehab  Date  01/29/18      Visit Diagnosis: NSTEMI (non-ST elevated myocardial infarction) [facility])  Patient's Home Medications on Admission:  Current Outpatient Medications:  .  albuterol (PROVENTIL HFA;VENTOLIN HFA) 108 (90 Base) MCG/ACT inhaler, Inhale 2 puffs into the lungs every 6 (six) hours as needed for wheezing or shortness of breath., Disp: 1 Inhaler, Rfl: 0 .  amLODipine (NORVASC) 10 MG tablet, Take 10 mg by mouth daily. , Disp: , Rfl:  .  atorvastatin (LIPITOR) 80 MG tablet, Take 80 mg by mouth daily. , Disp: , Rfl: 11 .  dorzolamide (TRUSOPT) 2 % ophthalmic solution, INSTILL ONE DROP IN [facility] EYE TWICE DAILY, Disp: , Rfl:  .  fluticasone (FLONASE) 50 MCG/ACT nasal spray, Place 2 sprays into both nostrils daily. , Disp: , Rfl:  .  gabapentin (NEURONTIN) 300 MG capsule, Take 1 capsule (300 mg total) by mouth 3 (three) times daily. (Patient taking differently: Take 300 mg by mouth 2 (two) times daily. ), Disp: 90 capsule, Rfl: 0 .  hydroxychloroquine (PLAQUENIL) 200 MG tablet, Take 200 mg by mouth every other day. , Disp: , Rfl:  .  Ipratropium-Albuterol (COMBIVENT) 20-100 MCG/ACT AERS respimat, Inhale 1 puff into the lungs every 6 (six) hours., Disp: 1 Inhaler, Rfl: 11 .  isosorbide mononitrate (IMDUR) 60 MG 24 hr tablet, Take 1 tablet (60 mg total) by mouth daily., Disp: 30 tablet, Rfl: 0 .  latanoprost (XALATAN) 0.005 % ophthalmic solution, INSTILL ONE DROP IN [facility] EYE AT BEDTIME, Disp: , Rfl:  .  metoprolol succinate (TOPROL-XL) 100 MG 24 hr tablet, Take 100 mg by mouth daily. , Disp: , Rfl:  .  montelukast (SINGULAIR) 10 MG tablet, Take 1 tablet (10 mg  total) by mouth as needed. Take before Gazyva infusion., Disp: 20 tablet, Rfl: 0 .  pantoprazole (PROTONIX) 40 MG tablet, Take 40 mg by mouth daily. , Disp: , Rfl:  .  potassium chloride SA (K-DUR,KLOR-CON) 20 MEQ tablet, Take 20 mEq by mouth daily. , Disp: , Rfl:  .  rivaroxaban (XARELTO) 20 MG TABS tablet, Take 1 tablet (20 mg total) by mouth daily with supper., Disp: 30 tablet, Rfl: 3 .  vitamin B-12 (CYANOCOBALAMIN) 1000 MCG tablet, TAKE ONE TABLET BY MOUTH ONCE DAILY, Disp: 90 tablet, Rfl: 0  Past Medical History: Past Medical History:  Diagnosis Date  . AAA (abdominal aortic aneurysm) without rupture ([place]) 04/05/2014  . AAA (abdominal aortic aneurysm) without rupture ([place]) 04/05/2014  . Abdominal aortic aneurysm without rupture ([place])   . Acute non-ST elevation myocardial infarction (NSTEMI) ([place]) 08/15/2014  . 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/28/2014  . APS (antiphospholipid syndrome) ([place])   . Arteriosclerosis of coronary artery 04/05/2014   Overview:  Sp cabg with lima to lad svg to d1 om1 rca 2004 with occluded svg to rca and d1 and pci stetn of om1 graft 2015   . Arthritis   . B12 deficiency 03/21/2017  . Barrett's esophagus   . Benign essential HTN 05/02/2014  . BPH (benign prostatic hyperplasia)   . CAD (coronary artery disease)   . CHF (congestive heart failure) ([place])   . Chronic systolic heart failure (  [place]) 04/19/2014   Overview:  With segmental LV systolic dysfunction ejection fraction of 35%   . CLL (chronic lymphocytic leukemia) ([place])   . CLL (chronic lymphocytic leukemia) ([place]) 05/24/2017  . DVT (deep venous thrombosis) ([place])   . Dysrhythmia    ATRIAL FIB.  Marland Kitchen GERD (gastroesophageal reflux disease)   . History of hiatal hernia   . History of shingles 2013  . Hyperlipemia   . Hyperplastic polyps of stomach   . Hypertension   . Myocardial infarction ([place]) 07/30/2014  . NSTEMI (non-ST elevated myocardial infarction) ([place])   . OSA (obstructive sleep apnea)   .  Osteoarthritis   . Pre-diabetes   . PVD (peripheral vascular disease) ([place])   . RA (rheumatoid arthritis) ([place])   . SOB (shortness of breath)     Tobacco Use: Social History   Tobacco Use  Smoking Status Former Smoker  . Packs/day: 0.50  . Years: 20.00  . Pack years: 10.00  . Types: Cigarettes  Smokeless Tobacco Never Used  Tobacco Comment   Quit     Labs: Recent Review Flowsheet Data    Labs for ITP Cardiac and Pulmonary Rehab Latest Ref Rng & Units 05/03/2013 01/19/2018   Cholestrol 0 - 200 mg/dL 196 116   LDLCALC 0 - 99 mg/dL 121(H) 49   HDL >40 mg/dL 42 51   Trlycerides <150 mg/dL 164 81       Exercise Target Goals: Exercise Program Goal: Individual exercise prescription set using results from initial 6 min walk test and THRR while considering  patient's activity barriers and safety.   Exercise Prescription Goal: Initial exercise prescription builds to 30-45 minutes a day of aerobic activity, 2-3 days per week.  Home exercise guidelines will be given to patient during program as part of exercise prescription that the participant will acknowledge.  Activity Barriers & Risk Stratification:   6 Minute Walk:   Oxygen Initial Assessment:   Oxygen Re-Evaluation:   Oxygen Discharge (Final Oxygen Re-Evaluation):   Initial Exercise Prescription:   Perform Capillary Blood Glucose checks as needed.  Exercise Prescription Changes: Exercise Prescription Changes    Row Name 02/25/18 0900 03/11/18 0900 03/25/18 0900 07/28/18 0900 08/10/18 1100     Response to Exercise   Blood Pressure (Admit)  124/68  124/62  130/80  132/70  124/64   Blood Pressure (Exercise)  126/80  136/80  128/80  140/82  134/60   Blood Pressure (Exit)  112/78  110/74  124/78  126/56  116/64   Heart Rate (Admit)  71 bpm  79 bpm  82 bpm  96 bpm  89 bpm   Heart Rate (Exercise)  113 bpm  113 bpm  115 bpm  123 bpm  118 bpm   Heart Rate (Exit)  92 bpm  87 bpm  81 bpm  95 bpm  94 bpm   Rating of  Perceived Exertion (Exercise)  '13  12  13  12  13   ' Symptoms  none  none  none  none  none   Duration  Continue with 30 min of aerobic exercise without signs/symptoms of physical distress.  Continue with 30 min of aerobic exercise without signs/symptoms of physical distress.  Continue with 30 min of aerobic exercise without signs/symptoms of physical distress.  Continue with 30 min of aerobic exercise without signs/symptoms of physical distress.  Continue with 30 min of aerobic exercise without signs/symptoms of physical distress.   Intensity  THRR unchanged  THRR unchanged  THRR unchanged  THRR unchanged  THRR unchanged     Progression   Progression  Continue to progress workloads to maintain intensity without signs/symptoms of physical distress.  Continue to progress workloads to maintain intensity without signs/symptoms of physical distress.  Continue to progress workloads to maintain intensity without signs/symptoms of physical distress.  Continue to progress workloads to maintain intensity without signs/symptoms of physical distress.  Continue to progress workloads to maintain intensity without signs/symptoms of physical distress.   Average METs  2.95  2.89  2.99  3.32  2.3     Resistance Training   Training Prescription  Yes  Yes  Yes  Yes  Yes   Weight  4 lbs  4 lbs  4 lbs  4 lbs  4 lbs   Reps  10-15  10-15  10-15  10-15  10-15     Interval Training   Interval Training  No  No  No  No  No     Treadmill   MPH  2.2  2.2  2.2  2.2  2.3   Grade  '1  1  1  1  1   ' Minutes  '15  15  15  15  15   ' METs  2.99  2.99  2.99  2.99  3.08     Recumbant Bike   Level  -  -  -  2  -   Watts  -  -  -  60  -   Minutes  -  -  -  15  -   METs  -  -  -  3.99  -     NuStep   Level  '4  4  4  4  ' -   Minutes  '15  15  15  15  ' -   METs  2.9  2.8  3  -  -     Arm Ergometer   Level  -  -  -  -  2   Minutes  -  -  -  -  15   METs  -  -  -  -  2.1     T5 Nustep   Level  -  -  -  -  4   Minutes  -  -  -   -  15   METs  -  -  -  -  2     Biostep-RELP   Level  -  -  -  -  2   Minutes  -  -  -  -  15   METs  -  -  -  -  2     Home Exercise Plan   Plans to continue exercise at  [place]  [place]  [place]   Frequency  Add 2 additional days to program exercise sessions.  Add 2 additional days to program exercise sessions.  Add 2 additional days to program exercise sessions.  Add 2 additional days to program exercise sessions.  Add 2 additional days to program exercise sessions.   Initial Home Exercises Provided  02/12/18  02/12/18  02/12/18  02/12/18  02/12/18      Exercise Comments:   Exercise Goals and Review:   Exercise Goals Re-Evaluation : Exercise Goals Re-Evaluation    Row Name 02/25/18 0859 03/19/18 1251 04/01/18 1225 07/28/18 0858 08/03/18 0837     Exercise Goal Re-Evaluation  Exercise Goals Review  Increase Physical Activity;Increase Strength and Stamina;Understanding of Exercise Prescription  Increase Physical Activity;Increase Strength and Stamina;Understanding of Exercise Prescription  Increase Physical Activity;Increase Strength and Stamina;Understanding of Exercise Prescription  Increase Physical Activity;Increase Strength and Stamina;Understanding of Exercise Prescription  Increase Physical Activity;Increase Strength and Stamina;Understanding of Exercise Prescription   Comments  [name] continues to do well in rehab.  He is up to level 4 on the NuStep.  We will continue to monitor his progress.   [name] has been doing well in rehab.  He was out on [place] due to back and hip pain.  This had been his biggest inhibitor. It keeps him from doing as much as he wants at home and limits his exercise.  Overall, he is doing better and it has started to ease.  He does report feeling stonger and has more stamina.   Spoke with [name] on the phone today.  [name] has not been doing as much as he should. He is doing what he can at  home. He misses coming into to class.   [name] is off to a good start back in rehab.  He was able to get back to his normal workloads and is already up to 3 METs on the BioStep.  We will continue to monitor his progress.  [name] is doing well with exericse.  He is doing some walking, but not enought due to the heat.  He is starting to feel stronger, he wished he had more energy.  He is now going to be coming three days a week and hopefully the extra day will help with the energy boost.   Expected Outcomes  Short: Increase workload on treadmill.  Long: Continue to exercise on off days at [place]: Walk more at home on off days.  Long: Continue to increase strength and stamina.   Short: Walk more at home.  Long: Continue to maintain strength and stamina.   Short: Start to increase workloads.  Long: Continue to increase strength  Short: Continue to exercise more in general.  Long: Continue to increase stamina.   [place] Name 08/10/18 1144             Exercise Goal Re-Evaluation   Exercise Goals Review  Increase Physical Activity;Increase Strength and Stamina;Understanding of Exercise Prescription       Comments  [name] continues to do well in rehab. He is now up to 2.3 mph on the treadmill.  We will continue to monitor his progress.       Expected Outcomes  Short: Continue to increase workloads.  Long: Continue to improve stamina.          Discharge Exercise Prescription (Final Exercise Prescription Changes): Exercise Prescription Changes - 08/10/18 1100      Response to Exercise   Blood Pressure (Admit)  124/64    Blood Pressure (Exercise)  134/60    Blood Pressure (Exit)  116/64    Heart Rate (Admit)  89 bpm    Heart Rate (Exercise)  118 bpm    Heart Rate (Exit)  94 bpm    Rating of Perceived Exertion (Exercise)  13    Symptoms  none    Duration  Continue with 30 min of aerobic exercise without signs/symptoms of physical distress.    Intensity  THRR unchanged      Progression   Progression   Continue to progress workloads to maintain intensity without signs/symptoms of physical distress.    Average METs  2.3  Resistance Training   Training Prescription  Yes    Weight  4 lbs    Reps  10-15      Interval Training   Interval Training  No      Treadmill   MPH  2.3    Grade  1    Minutes  15    METs  3.08      Arm Ergometer   Level  2    Minutes  15    METs  2.1      T5 Nustep   Level  4    Minutes  15    METs  2      Biostep-RELP   Level  2    Minutes  15    METs  2      Home Exercise Plan   Plans to continue exercise at  [facility]    Frequency  Add 2 additional days to program exercise sessions.    Initial Home Exercises Provided  02/12/18       Nutrition:  Target Goals: Understanding of nutrition guidelines, daily intake of sodium <1523m, cholesterol <2047m calories 30% from fat and 7% or less from saturated fats, daily to have 5 or more servings of fruits and vegetables.  Biometrics:    Nutrition Therapy Plan and Nutrition Goals: Nutrition Therapy & Goals - 03/30/18 1012      Nutrition Therapy   Diet  Heart Healthy Low Sodium Diet 1750-1950kcal (wst: 42 inches!; ref<40inches)(BMI: 29.46)    Protein (specify units)  75-80g    Fiber  30 grams    Whole Grain Foods  3 servings    Saturated Fats  12 max. grams    Fruits and Vegetables  5 servings/day    Sodium  1.5 grams      Personal Nutrition Goals   Nutrition Goal  ST: increase protein by 1/2-1 serving/ day LT: feel better and gain more energy    Comments  Vegetables 3 servings per day, not making much progress with increasing fruit intake; told him that increasing either fruit or vegetable intake would be fine, if increasing his vegetable intake is easier he should do that. He notes that he has a little bit of oatmeal but not much, and will choose whole wheat bread, but doesn't eat many whole grains. Pt reports normally not eating much; boiled eggw/ slice of toast, chicken w/  green beans, and then a peanut butter cracker for dinner.       Intervention Plan   Intervention  Prescribe, educate and counsel regarding individualized specific dietary modifications aiming towards targeted core components such as weight, hypertension, lipid management, diabetes, heart failure and other comorbidities.    Expected Outcomes  Short Term Goal: Understand basic principles of dietary content, such as calories, fat, sodium, cholesterol and nutrients.;Short Term Goal: A plan has been developed with personal nutrition goals set during dietitian appointment.;Long Term Goal: Adherence to prescribed nutrition plan.       Nutrition Assessments:   Nutrition Goals Re-Evaluation: Nutrition Goals Re-Evaluation    [place] 03/19/18 1258 07/22/18 0857 08/14/18 0828         Goals   Current Weight  -  -  207 lb 8 oz (94.1 kg)     Nutrition Goal  Heart Healthy diet  ST: work on adding one F or different V serving a day LT: feel better and gain more energy  ST: work on adding one F or different V serving a  day  LT: feel better and gain more energy     Comment  [name] continues to eat lots of vegetables and a few fruits. He sticks to his whole grains and lean meats. Overall, he feels that he has a good grasp on his diet.  [name] continues to eat vegetables and a few fruits, talked about trying to incorporate a fruit that he enjoys, but we could add one more different vegetable serving if he cannot find one he enjoys to have in his diet. He sticks to his whole grains and lean meats, he has increased his protein and eating beans, fish, and lean poultry without the skin. Overall, he feels that he has a good grasp on his diet.  Pt reports eating a variety of vegetables besides leafy greens as he is on a blood thinner; pt used to be on warfarin, now on Xarelto which does not need consistent vitamin K intake like warfarin or coumadin (verified on website), told pt he can have his green leafy vegetables to  increase variety. Pt didn't want to make any new goals and thinks he is doing well with his diet. Pt reports his arthritis has been bothering him, told him if it gets too bad and interferes with his diet, that we could work together to make meals that would be less taxing on his wrists and hands.     Expected Outcome  Short: Try more fruits for variety  Long: Continue to follow healthy eating plan  Short: Try more fruits or different vegetable for variety  Long: Continue to follow healthy eating plan  Short: Try more fruits or different vegetable for variety  Long: Continue to follow healthy eating plan        Nutrition Goals Discharge (Final Nutrition Goals Re-Evaluation): Nutrition Goals Re-Evaluation - 08/14/18 0828      Goals   Current Weight  207 lb 8 oz (94.1 kg)    Nutrition Goal  ST: work on adding one F or different V serving a day  LT: feel better and gain more energy    Comment  Pt reports eating a variety of vegetables besides leafy greens as he is on a blood thinner; pt used to be on warfarin, now on Xarelto which does not need consistent vitamin K intake like warfarin or coumadin (verified on website), told pt he can have his green leafy vegetables to increase variety. Pt didn't want to make any new goals and thinks he is doing well with his diet. Pt reports his arthritis has been bothering him, told him if it gets too bad and interferes with his diet, that we could work together to make meals that would be less taxing on his wrists and hands.    Expected Outcome  Short: Try more fruits or different vegetable for variety  Long: Continue to follow healthy eating plan       Psychosocial: Target Goals: Acknowledge presence or absence of significant depression and/or stress, maximize coping skills, provide positive support system. Participant is able to verbalize types and ability to use techniques and skills needed for reducing stress and depression.   Initial Review & Psychosocial  Screening:   Quality of Life Scores:   Scores of 19 and below usually indicate a poorer quality of life in these areas.  A difference of  2-3 points is a clinically meaningful difference.  A difference of 2-3 points in the total score of the Quality of Life Index has been associated with significant improvement in  overall quality of life, self-image, physical symptoms, and general health in studies assessing change in quality of life.  PHQ-9: Recent Review Flowsheet Data    Depression screen [facility] 2/9 01/29/2018 01/29/2018 11/16/2014 09/26/2014   Decreased Interest - 0 2 1   Down, Depressed, Hopeless 2 0 0 0   PHQ - 2 Score 2 0 2 1   Altered sleeping - 0 0 1   Tired, decreased energy 3 0 2 1   Change in appetite - 0 0 0   Feeling bad or failure about yourself  - 0 0 0   Trouble concentrating - 0 - 0   Moving slowly or fidgety/restless - 0 0 0   Suicidal thoughts - 0 0 0   PHQ-9 Score - 0 4 3   Difficult doing work/chores Somewhat difficult - Somewhat difficult Somewhat difficult     Interpretation of Total Score  Total Score Depression Severity:  1-4 = Minimal depression, 5-9 = Mild depression, 10-14 = Moderate depression, 15-19 = Moderately severe depression, 20-27 = Severe depression   Psychosocial Evaluation and Intervention:   Psychosocial Re-Evaluation: Psychosocial Re-Evaluation    [place] Name 03/19/18 1255 08/03/18 0840           Psychosocial Re-Evaluation   Current issues with  Current Sleep Concerns;Current Stress Concerns  Current Stress Concerns      Comments  [name] has been doing well in rehab. His back and hip pain have limited his ability to do as much as he would like, but this is getting better.  He sleeps well and dreams a lot.  He always feels better after his exercise days.  He tries to not let things get to him too much.   [name] is doing well.  He wishes his energy levels were higher, but he is getting better.  He continues to have some hip and back pain with his left  leg.  He is planning to go back to rehuematologist again for help.  He continues to sleep well and doing well overall.      Expected Outcomes  Short: Continue to work through hip and back pain.  Long: Continue to stay positive.   Short: Continue to seek treatment for hip and back.  Long: Continue to stay positive.      Interventions  Encouraged to attend Cardiac Rehabilitation for the exercise  Encouraged to attend Cardiac Rehabilitation for the exercise      Continue Psychosocial Services   Follow up required by staff  -         Psychosocial Discharge (Final Psychosocial Re-Evaluation): Psychosocial Re-Evaluation - 08/03/18 0840      Psychosocial Re-Evaluation   Current issues with  Current Stress Concerns    Comments  [name] is doing well.  He wishes his energy levels were higher, but he is getting better.  He continues to have some hip and back pain with his left leg.  He is planning to go back to rehuematologist again for help.  He continues to sleep well and doing well overall.    Expected Outcomes  Short: Continue to seek treatment for hip and back.  Long: Continue to stay positive.    Interventions  Encouraged to attend Cardiac Rehabilitation for the exercise       Vocational Rehabilitation: Provide vocational rehab assistance to qualifying candidates.   Vocational Rehab Evaluation & Intervention:   Education: Education Goals: Education classes will be provided on a variety of topics geared toward better understanding of  heart health and risk factor modification. Participant will state understanding/return demonstration of topics presented as noted by education test scores.  Learning Barriers/Preferences:   Education Topics:  AED/CPR: - Group verbal and written instruction with the use of models to demonstrate the basic use of the AED with the basic ABC's of resuscitation.   General Nutrition Guidelines/Fats and Fiber: -Group instruction provided by verbal, written material,  models and posters to present the general guidelines for heart healthy nutrition. Gives an explanation and review of dietary fats and fiber.   Cardiac Rehab from 11/21/2014 in [facility] Cardiac and Pulmonary Rehab  Date  10/17/14  Educator  PI  Instruction Review Code (retired)  2- meets goals/outcomes      Controlling Sodium/Reading Food Labels: -Group verbal and written material supporting the discussion of sodium use in heart healthy nutrition. Review and explanation with models, verbal and written materials for utilization of the food label.   Cardiac Rehab from 03/19/2018 in [facility] Cardiac and Pulmonary Rehab  Date  03/19/18  Educator  [facility]  Instruction Review Code  5- Refused Teaching      Exercise Physiology & General Exercise Guidelines: - Group verbal and written instruction with models to review the exercise physiology of the cardiovascular system and associated critical values. Provides general exercise guidelines with specific guidelines to those with heart or lung disease.    Cardiac Rehab from 11/21/2014 in [facility] Cardiac and Pulmonary Rehab  Date  11/09/14  Educator  RM  Instruction Review Code (retired)  2- meets goals/outcomes      Aerobic Exercise & Resistance Training: - Gives group verbal and written instruction on the various components of exercise. Focuses on aerobic and resistive training programs and the benefits of this training and how to safely progress through these programs..   Cardiac Rehab from 11/21/2014 in [facility] Cardiac and Pulmonary Rehab  Date  11/14/14  Educator  RM  Instruction Review Code (retired)  2- Statistician, Balance, Mind/Body Relaxation: Provides group verbal/written instruction on the benefits of flexibility and balance training, including mind/body exercise modes such as yoga, pilates and tai chi.  Demonstration and skill practice provided.   Cardiac Rehab from 03/19/2018 in [facility] Cardiac and Pulmonary Rehab  Date   02/03/18  Educator  AS  Instruction Review Code  1- Verbalizes Understanding      Stress and Anxiety: - Provides group verbal and written instruction about the health risks of elevated stress and causes of high stress.  Discuss the correlation between heart/lung disease and anxiety and treatment options. Review healthy ways to manage with stress and anxiety.   Cardiac Rehab from 03/19/2018 in [facility] Cardiac and Pulmonary Rehab  Date  02/24/18  Educator  [facility]  Instruction Review Code  5- Refused Teaching      Depression: - Provides group verbal and written instruction on the correlation between heart/lung disease and depressed mood, treatment options, and the stigmas associated with seeking treatment.   Cardiac Rehab from 03/19/2018 in [facility] Cardiac and Pulmonary Rehab  Date  03/10/18  Educator  [facility]  Instruction Review Code  5- Refused Teaching      Anatomy & Physiology of the Heart: - Group verbal and written instruction and models provide basic cardiac anatomy and physiology, with the coronary electrical and arterial systems. Review of Valvular disease and Heart Failure   Cardiac Rehab from 03/19/2018 in [facility] Cardiac and Pulmonary Rehab  Date  02/12/18  Educator  CE  Instruction Review  Code  5- Refused Teaching      Cardiac Procedures: - Group verbal and written instruction to review commonly prescribed medications for heart disease. Reviews the medication, class of the drug, and side effects. Includes the steps to properly store meds and maintain the prescription regimen. (beta blockers and nitrates)   Cardiac Rehab from 03/19/2018 in [facility] Cardiac and Pulmonary Rehab  Date  02/26/18  Educator  CE  Instruction Review Code  5- Refused Teaching      Cardiac Medications I: - Group verbal and written instruction to review commonly prescribed medications for heart disease. Reviews the medication, class of the drug, and side effects. Includes the steps to properly store meds and maintain  the prescription regimen.   Cardiac Rehab from 03/19/2018 in [facility] Cardiac and Pulmonary Rehab  Date  02/17/18  Educator  SB  Instruction Review Code  1- Verbalizes Understanding      Cardiac Medications II: -Group verbal and written instruction to review commonly prescribed medications for heart disease. Reviews the medication, class of the drug, and side effects. (all other drug classes)   Cardiac Rehab from 03/19/2018 in [facility] Cardiac and Pulmonary Rehab  Date  02/05/18  Educator  CE  Instruction Review Code  1- Verbalizes Understanding       Go Sex-Intimacy & Heart Disease, Get SMART - Goal Setting: - Group verbal and written instruction through game format to discuss heart disease and the return to sexual intimacy. Provides group verbal and written material to discuss and apply goal setting through the application of the S.M.A.R.T. Method.   Cardiac Rehab from 03/19/2018 in [facility] Cardiac and Pulmonary Rehab  Date  02/26/18  Educator  CE  Instruction Review Code  5- Refused Teaching      Other Matters of the Heart: - Provides group verbal, written materials and models to describe Stable Angina and Peripheral Artery. Includes description of the disease process and treatment options available to the cardiac patient.   Cardiac Rehab from 03/19/2018 in [facility] Cardiac and Pulmonary Rehab  Date  02/12/18  Educator  CE  Instruction Review Code  1- Verbalizes Understanding      Exercise & Equipment Safety: - Individual verbal instruction and demonstration of equipment use and safety with use of the equipment.   Cardiac Rehab from 03/19/2018 in [facility] Cardiac and Pulmonary Rehab  Date  01/29/18  Educator  sb  Instruction Review Code  1- Verbalizes Understanding      Infection Prevention: - Provides verbal and written material to individual with discussion of infection control including proper hand washing and proper equipment cleaning during exercise session.   Cardiac Rehab from  03/19/2018 in [facility] Cardiac and Pulmonary Rehab  Date  01/29/18  Educator  SB1  Instruction Review Code  1- Verbalizes Understanding      Falls Prevention: - Provides verbal and written material to individual with discussion of falls prevention and safety.   Cardiac Rehab from 03/19/2018 in [place] Cardiac and Pulmonary Rehab  Date  01/29/18  Educator  sb  Instruction Review Code  1- Verbalizes Understanding      Diabetes: - Individual verbal and written instruction to review signs/symptoms of diabetes, desired ranges of glucose level fasting, after meals and with exercise. Acknowledge that pre and post exercise glucose checks will be done for 3 sessions at entry of program.   Know Your Numbers and Risk Factors: -Group verbal and written instruction about important numbers in your health.  Discussion of what are risk factors  and how they play a role in the disease process.  Review of Cholesterol, Blood Pressure, Diabetes, and BMI and the role they play in your overall health.   Cardiac Rehab from 03/19/2018 in [facility] Cardiac and Pulmonary Rehab  Date  02/05/18  Educator  CE  Instruction Review Code  1- Verbalizes Understanding      Sleep Hygiene: -Provides group verbal and written instruction about how sleep can affect your health.  Define sleep hygiene, discuss sleep cycles and impact of sleep habits. Review good sleep hygiene tips.    Cardiac Rehab from 03/19/2018 in [facility] Cardiac and Pulmonary Rehab  Date  02/10/18  Educator  CE  Instruction Review Code  5- Refused Teaching      Other: -Provides group and verbal instruction on various topics (see comments)   Knowledge Questionnaire Score:   Core Components/Risk Factors/Patient Goals at Admission:   Core Components/Risk Factors/Patient Goals Review:  Goals and Risk Factor Review    Row Name 03/19/18 1259 08/03/18 0841           Core Components/Risk Factors/Patient Goals Review   Personal Goals Review  Weight  Management/Obesity;Lipids;Hypertension;Heart Failure;Tobacco Cessation;Other  Weight Management/Obesity;Lipids;Hypertension;Heart Failure;Tobacco Cessation      Review  [name]'s weight continues to stay between 208-210 lbs.  He contnues to check his pressures daily as well.  He denies any symptoms of heart failure.  He continues to smoke currently and is working towards his goal to quit.   [name] continues to keep his weight steady around 208 lbs.  He continues to have good pressure readings and checks them at home.  He has occasional shortness of breath but nothing excessive.  He continues to smoke 1-2 cig a day and is using patches to help as well.   He is not ready to set a quit date yet, but hopefully next month he will.      Expected Outcomes  Short: Continue to monitor heart failure symptoms  Long: Continue to monitor risk factors.   Short: Continue to work on weight loss.  Long: Continue to work towards quitting smoking.         Core Components/Risk Factors/Patient Goals at Discharge (Final Review):  Goals and Risk Factor Review - 08/03/18 0841      Core Components/Risk Factors/Patient Goals Review   Personal Goals Review  Weight Management/Obesity;Lipids;Hypertension;Heart Failure;Tobacco Cessation    Review  [name] continues to keep his weight steady around 208 lbs.  He continues to have good pressure readings and checks them at home.  He has occasional shortness of breath but nothing excessive.  He continues to smoke 1-2 cig a day and is using patches to help as well.   He is not ready to set a quit date yet, but hopefully next month he will.    Expected Outcomes  Short: Continue to work on weight loss.  Long: Continue to work towards quitting smoking.       ITP Comments: ITP Comments    Row Name 03/04/18 0541 03/25/18 0932 04/01/18 1225 04/01/18 1348 07/22/18 1359   ITP Comments  30 Day Review. Continue with ITP unless directed changes per Medical Director review.  Our program is currently  closed due to COVID-19.  We are communicating with patient via phone calls and emails.   [name] called and requested to have his sessions sent to him to send into insurance.  I have printed these off to send out in the mail.    30 day review completed. Continue  with ITP unless directed changes by Medical Director at review  30 day review cycle restarting  after being closed since March 16 because of  Covid 19 pandemic. Program opened to patients on July 6. Not all have returned. ITP updated and sent to Medical Director for review,changes as needed and signature   [place] Name 08/19/18 0624           ITP Comments  30 Day Review Completed today. Continue with ITP unless changed by Medical Director review.          Comments:

## 2018-08-19 NOTE — Progress Notes (Signed)
Daily Session Note  Patient Details  Name: Martin Adkins MRN: [id number] Date of Birth: 06-08-1945 Referring Provider:     Cardiac Rehab from 01/29/2018 in [place] Cardiac and Pulmonary Rehab  Referring Provider  [name]      Encounter Date: 08/19/2018  Check In: Session Check In - 08/19/18 0820      Check-In   Supervising physician immediately available to respond to emergencies  See telemetry face sheet for immediately available ER MD    Location  [facility]-Cardiac & Pulmonary Rehab    Staff Present  Alberteen Sam, MA, RCEP, CCRP, CCET;Joseph Christain [place], RN BSN    Virtual Visit  No    Medication changes reported      No    Fall or balance concerns reported     No    Warm-up and Cool-down  Performed on first and last piece of equipment    Resistance Training Performed  Yes    VAD Patient?  No    PAD/SET Patient?  No      Pain Assessment   Currently in Pain?  No/denies          Social History   Tobacco Use  Smoking Status Former Smoker  . Packs/day: 0.50  . Years: 20.00  . Pack years: 10.00  . Types: Cigarettes  Smokeless Tobacco Never Used  Tobacco Comment   Quit     Goals Met:  Independence with exercise equipment Exercise tolerated well No report of cardiac concerns or symptoms Strength training completed today  Goals Unmet:  Not Applicable  Comments: Pt able to follow exercise prescription today without complaint.  Will continue to monitor for progression.    Dr. Emily Filbert is Medical Director for [place] and [facility].

## 2018-08-21 ENCOUNTER — Other Ambulatory Visit: Payer: Self-pay

## 2018-08-21 DIAGNOSIS — I214 Non-ST elevation (NSTEMI) myocardial infarction: Secondary | ICD-10-CM | POA: Diagnosis not present

## 2018-08-21 NOTE — Progress Notes (Signed)
Daily Session Note  Patient Details  Name: Martin Adkins MRN: [id number] Date of Birth: 08-16-1945 Referring Provider:     Cardiac Rehab from 01/29/2018 in [facility] Cardiac and Pulmonary Rehab  Referring Provider  [name]      Encounter Date: 08/21/2018  Check In:      Social History   Tobacco Use  Smoking Status Former Smoker  . Packs/day: 0.50  . Years: 20.00  . Pack years: 10.00  . Types: Cigarettes  Smokeless Tobacco Never Used  Tobacco Comment   Quit     Goals Met:  Independence with exercise equipment Exercise tolerated well No report of cardiac concerns or symptoms Strength training completed today  Goals Unmet:  Not Applicable  Comments: Pt able to follow exercise prescription today without complaint.  Will continue to monitor for progression.    Dr. Emily Filbert is Medical Director for [place] and [facility].

## 2018-08-24 ENCOUNTER — Other Ambulatory Visit: Payer: Self-pay

## 2018-08-24 ENCOUNTER — Encounter: Payer: Medicare PPO | Admitting: *Deleted

## 2018-08-24 DIAGNOSIS — I214 Non-ST elevation (NSTEMI) myocardial infarction: Secondary | ICD-10-CM

## 2018-08-24 NOTE — Progress Notes (Signed)
Daily Session Note  Patient Details  Name: DANNIS DEROCHE MRN: [id number] Date of Birth: [birth year] Referring Provider:     Cardiac Rehab from 01/29/2018 in [facility] Cardiac and Pulmonary Rehab  Referring Provider  [name]      Encounter Date: 08/24/2018  Check In: Session Check In - 08/24/18 9449      Check-In   Supervising physician immediately available to respond to emergencies  See telemetry face sheet for immediately available ER MD    Location  [facility]-Cardiac & Pulmonary Rehab    Staff Present  Heath Lark, RN, BSN, Laveda Norman, BS, ACSM CEP, Exercise Physiologist;Amanda Oletta Darter, [place], ACSM CEP, Exercise Physiologist    Virtual Visit  No    Medication changes reported      No    Fall or balance concerns reported     No    Warm-up and Cool-down  Performed on first and last piece of equipment    Resistance Training Performed  Yes    VAD Patient?  No    PAD/SET Patient?  No      Pain Assessment   Currently in Pain?  No/denies          Social History   Tobacco Use  Smoking Status Former Smoker  . Packs/day: 0.50  . Years: 20.00  . Pack years: 10.00  . Types: Cigarettes  Smokeless Tobacco Never Used  Tobacco Comment   Quit     Goals Met:  Independence with exercise equipment Exercise tolerated well No report of cardiac concerns or symptoms Strength training completed today  Goals Unmet:  Not Applicable  Comments: Pt able to follow exercise prescription today without complaint.  Will continue to monitor for progression.    Dr. Emily Filbert is Medical Director for [place] and [facility].

## 2018-08-25 ENCOUNTER — Telehealth: Payer: Self-pay

## 2018-08-25 NOTE — Telephone Encounter (Signed)
Pt reports having a fever and will let us know what is going on when he finds out.

## 2018-08-27 ENCOUNTER — Other Ambulatory Visit: Payer: Self-pay

## 2018-08-27 ENCOUNTER — Inpatient Hospital Stay
Admission: EM | Admit: 2018-08-27 | Discharge: 2018-08-30 | DRG: 871 | Disposition: A | Discharge status: Home / Self Care | Payer: Medicare PPO | Source: Ambulatory Visit | Attending: Internal Medicine | Admitting: Internal Medicine

## 2018-08-27 ENCOUNTER — Encounter: Payer: Self-pay | Admitting: Emergency Medicine

## 2018-08-27 ENCOUNTER — Emergency Department: Payer: Medicare PPO

## 2018-08-27 DIAGNOSIS — Z955 Presence of coronary angioplasty implant and graft: Secondary | ICD-10-CM

## 2018-08-27 DIAGNOSIS — I110 Hypertensive heart disease with heart failure: Secondary | ICD-10-CM | POA: Diagnosis present

## 2018-08-27 DIAGNOSIS — K2270 Barrett's esophagus without dysplasia: Secondary | ICD-10-CM | POA: Diagnosis present

## 2018-08-27 DIAGNOSIS — E538 Deficiency of other specified B group vitamins: Secondary | ICD-10-CM | POA: Diagnosis present

## 2018-08-27 DIAGNOSIS — J9601 Acute respiratory failure with hypoxia: Secondary | ICD-10-CM | POA: Diagnosis present

## 2018-08-27 DIAGNOSIS — I480 Paroxysmal atrial fibrillation: Secondary | ICD-10-CM | POA: Diagnosis present

## 2018-08-27 DIAGNOSIS — R7303 Prediabetes: Secondary | ICD-10-CM | POA: Diagnosis present

## 2018-08-27 DIAGNOSIS — I2510 Atherosclerotic heart disease of native coronary artery without angina pectoris: Secondary | ICD-10-CM | POA: Diagnosis present

## 2018-08-27 DIAGNOSIS — Z833 Family history of diabetes mellitus: Secondary | ICD-10-CM

## 2018-08-27 DIAGNOSIS — M069 Rheumatoid arthritis, unspecified: Secondary | ICD-10-CM | POA: Diagnosis present

## 2018-08-27 DIAGNOSIS — Z87891 Personal history of nicotine dependence: Secondary | ICD-10-CM

## 2018-08-27 DIAGNOSIS — M1990 Unspecified osteoarthritis, unspecified site: Secondary | ICD-10-CM | POA: Diagnosis present

## 2018-08-27 DIAGNOSIS — G4733 Obstructive sleep apnea (adult) (pediatric): Secondary | ICD-10-CM | POA: Diagnosis present

## 2018-08-27 DIAGNOSIS — N400 Enlarged prostate without lower urinary tract symptoms: Secondary | ICD-10-CM | POA: Diagnosis present

## 2018-08-27 DIAGNOSIS — Z8042 Family history of malignant neoplasm of prostate: Secondary | ICD-10-CM

## 2018-08-27 DIAGNOSIS — I712 Thoracic aortic aneurysm, without rupture: Secondary | ICD-10-CM | POA: Diagnosis present

## 2018-08-27 DIAGNOSIS — E871 Hypo-osmolality and hyponatremia: Secondary | ICD-10-CM | POA: Diagnosis present

## 2018-08-27 DIAGNOSIS — E872 Acidosis: Secondary | ICD-10-CM | POA: Diagnosis present

## 2018-08-27 DIAGNOSIS — I739 Peripheral vascular disease, unspecified: Secondary | ICD-10-CM | POA: Diagnosis present

## 2018-08-27 DIAGNOSIS — I252 Old myocardial infarction: Secondary | ICD-10-CM

## 2018-08-27 DIAGNOSIS — D6861 Antiphospholipid syndrome: Secondary | ICD-10-CM | POA: Diagnosis present

## 2018-08-27 DIAGNOSIS — Z9049 Acquired absence of other specified parts of digestive tract: Secondary | ICD-10-CM

## 2018-08-27 DIAGNOSIS — Z7951 Long term (current) use of inhaled steroids: Secondary | ICD-10-CM

## 2018-08-27 DIAGNOSIS — J189 Pneumonia, unspecified organism: Secondary | ICD-10-CM | POA: Diagnosis present

## 2018-08-27 DIAGNOSIS — E785 Hyperlipidemia, unspecified: Secondary | ICD-10-CM | POA: Diagnosis present

## 2018-08-27 DIAGNOSIS — Z803 Family history of malignant neoplasm of breast: Secondary | ICD-10-CM

## 2018-08-27 DIAGNOSIS — I5022 Chronic systolic (congestive) heart failure: Secondary | ICD-10-CM | POA: Diagnosis present

## 2018-08-27 DIAGNOSIS — Z880 Allergy status to penicillin: Secondary | ICD-10-CM

## 2018-08-27 DIAGNOSIS — Z79899 Other long term (current) drug therapy: Secondary | ICD-10-CM

## 2018-08-27 DIAGNOSIS — Z8249 Family history of ischemic heart disease and other diseases of the circulatory system: Secondary | ICD-10-CM

## 2018-08-27 DIAGNOSIS — K219 Gastro-esophageal reflux disease without esophagitis: Secondary | ICD-10-CM | POA: Diagnosis present

## 2018-08-27 DIAGNOSIS — A419 Sepsis, unspecified organism: Principal | ICD-10-CM | POA: Diagnosis present

## 2018-08-27 DIAGNOSIS — E876 Hypokalemia: Secondary | ICD-10-CM | POA: Diagnosis present

## 2018-08-27 DIAGNOSIS — D696 Thrombocytopenia, unspecified: Secondary | ICD-10-CM | POA: Diagnosis present

## 2018-08-27 DIAGNOSIS — Z888 Allergy status to other drugs, medicaments and biological substances status: Secondary | ICD-10-CM

## 2018-08-27 DIAGNOSIS — N179 Acute kidney failure, unspecified: Secondary | ICD-10-CM | POA: Diagnosis present

## 2018-08-27 DIAGNOSIS — Z8619 Personal history of other infectious and parasitic diseases: Secondary | ICD-10-CM

## 2018-08-27 DIAGNOSIS — Z856 Personal history of leukemia: Secondary | ICD-10-CM

## 2018-08-27 DIAGNOSIS — Z20828 Contact with and (suspected) exposure to other viral communicable diseases: Secondary | ICD-10-CM | POA: Diagnosis present

## 2018-08-27 LAB — URINALYSIS, COMPLETE (UACMP) WITH MICROSCOPIC
Glucose, UA: NEGATIVE mg/dL
Ketones, ur: NEGATIVE mg/dL
Leukocytes,Ua: NEGATIVE
Nitrite: NEGATIVE
Protein, ur: 100 mg/dL — AB
Specific Gravity, Urine: 1.032 — ABNORMAL HIGH (ref 1.005–1.030)
Squamous Epithelial / HPF: NONE SEEN (ref 0–5)
pH: 5 (ref 5.0–8.0)

## 2018-08-27 LAB — CBC WITH DIFFERENTIAL/PLATELET
Abs Immature Granulocytes: 0.08 10*3/uL — ABNORMAL HIGH (ref 0.00–0.07)
Basophils Absolute: 0 10*3/uL (ref 0.0–0.1)
Basophils Relative: 0 %
Eosinophils Absolute: 0 10*3/uL (ref 0.0–0.5)
Eosinophils Relative: 0 %
HCT: 40.2 % (ref 39.0–52.0)
Hemoglobin: 13.1 g/dL (ref 13.0–17.0)
Immature Granulocytes: 1 %
Lymphocytes Relative: 2 %
Lymphs Abs: 0.2 10*3/uL — ABNORMAL LOW (ref 0.7–4.0)
MCH: 29.8 pg (ref 26.0–34.0)
MCHC: 32.6 g/dL (ref 30.0–36.0)
MCV: 91.6 fL (ref 80.0–100.0)
Monocytes Absolute: 0.6 10*3/uL (ref 0.1–1.0)
Monocytes Relative: 6 %
Neutro Abs: 9.4 10*3/uL — ABNORMAL HIGH (ref 1.7–7.7)
Neutrophils Relative %: 91 %
Platelets: 120 10*3/uL — ABNORMAL LOW (ref 150–400)
RBC: 4.39 MIL/uL (ref 4.22–5.81)
RDW: 13.6 % (ref 11.5–15.5)
WBC: 10.4 10*3/uL (ref 4.0–10.5)
nRBC: 0 % (ref 0.0–0.2)

## 2018-08-27 LAB — PROCALCITONIN: Procalcitonin: 1.82 ng/mL

## 2018-08-27 LAB — COMPREHENSIVE METABOLIC PANEL
ALT: 16 U/L (ref 0–44)
AST: 30 U/L (ref 15–41)
Albumin: 3.4 g/dL — ABNORMAL LOW (ref 3.5–5.0)
Alkaline Phosphatase: 136 U/L — ABNORMAL HIGH (ref 38–126)
Anion gap: 14 (ref 5–15)
BUN: 17 mg/dL (ref 8–23)
CO2: 20 mmol/L — ABNORMAL LOW (ref 22–32)
Calcium: 8.5 mg/dL — ABNORMAL LOW (ref 8.9–10.3)
Chloride: 98 mmol/L (ref 98–111)
Creatinine, Ser: 1.27 mg/dL — ABNORMAL HIGH (ref 0.61–1.24)
GFR calc Af Amer: >60 mL/min (ref >60)
GFR calc non Af Amer: 57 mL/min — ABNORMAL LOW (ref >60)
Glucose, Bld: 136 mg/dL — ABNORMAL HIGH (ref 70–99)
Potassium: 3.2 mmol/L — ABNORMAL LOW (ref 3.5–5.1)
Sodium: 132 mmol/L — ABNORMAL LOW (ref 135–145)
Total Bilirubin: 4 mg/dL — ABNORMAL HIGH (ref 0.3–1.2)
Total Protein: 7.7 g/dL (ref 6.5–8.1)

## 2018-08-27 LAB — INFLUENZA PANEL BY PCR (TYPE A & B)
Influenza A By PCR: NEGATIVE
Influenza B By PCR: NEGATIVE

## 2018-08-27 LAB — SARS CORONAVIRUS 2 BY RT PCR (HOSPITAL ORDER, PERFORMED IN ~~LOC~~ HOSPITAL LAB): SARS Coronavirus 2: NEGATIVE

## 2018-08-27 LAB — LACTIC ACID, PLASMA
Lactic Acid, Venous: 2 mmol/L (ref 0.5–1.9)
Lactic Acid, Venous: 1.5 mmol/L (ref 0.5–1.9)

## 2018-08-27 LAB — PROTIME-INR
INR: 1.7 — ABNORMAL HIGH (ref 0.8–1.2)
Prothrombin Time: 19.8 seconds — ABNORMAL HIGH (ref 11.4–15.2)

## 2018-08-27 MED ORDER — AMLODIPINE BESYLATE 10 MG PO TABS
10.0000 mg | ORAL_TABLET | Freq: Every day | ORAL | Status: DC
  Administered 2018-08-28 – 2018-08-30 (×3): 10 mg via ORAL
  Filled 2018-08-27 (×3): qty 1

## 2018-08-27 MED ORDER — DORZOLAMIDE HCL 2 % OP SOLN
1.0000 [drp] | Freq: Two times a day (BID) | OPHTHALMIC | Status: DC
  Filled 2018-08-27: qty 10

## 2018-08-27 MED ORDER — OXYCODONE HCL 5 MG PO TABS
5.0000 mg | ORAL_TABLET | Freq: Four times a day (QID) | ORAL | Status: DC | PRN
  Administered 2018-08-27 – 2018-08-29 (×5): 5 mg via ORAL
  Filled 2018-08-27 (×6): qty 1

## 2018-08-27 MED ORDER — ATORVASTATIN CALCIUM 80 MG PO TABS
80.0000 mg | ORAL_TABLET | Freq: Every day | ORAL | Status: DC
  Administered 2018-08-28 – 2018-08-30 (×3): 80 mg via ORAL
  Filled 2018-08-27 (×3): qty 1
  Filled 2018-08-27: qty 4
  Filled 2018-08-27: qty 1
  Filled 2018-08-27 (×2): qty 4

## 2018-08-27 MED ORDER — IOHEXOL 300 MG/ML  SOLN
75.0000 mL | Freq: Once | INTRAMUSCULAR | Status: AC | PRN
  Administered 2018-08-27: 75 mL via INTRAVENOUS

## 2018-08-27 MED ORDER — GUAIFENESIN-DM 100-10 MG/5ML PO SYRP
5.0000 mL | ORAL_SOLUTION | ORAL | Status: DC | PRN
  Administered 2018-08-28 – 2018-08-29 (×4): 5 mL via ORAL
  Filled 2018-08-27 (×5): qty 5

## 2018-08-27 MED ORDER — SODIUM CHLORIDE 0.9 % IV SOLN
1.0000 g | INTRAVENOUS | Status: DC
  Administered 2018-08-28 – 2018-08-30 (×3): 1 g via INTRAVENOUS
  Filled 2018-08-27: qty 10
  Filled 2018-08-27: qty 1
  Filled 2018-08-27: qty 10
  Filled 2018-08-27: qty 1

## 2018-08-27 MED ORDER — SODIUM CHLORIDE 0.9 % IV SOLN
500.0000 mg | INTRAVENOUS | Status: DC
  Administered 2018-08-28 – 2018-08-30 (×3): 500 mg via INTRAVENOUS
  Filled 2018-08-27 (×4): qty 500

## 2018-08-27 MED ORDER — SODIUM CHLORIDE 0.9 % IV SOLN
INTRAVENOUS | Status: DC
  Administered 2018-08-27 – 2018-08-30 (×5): via INTRAVENOUS

## 2018-08-27 MED ORDER — IPRATROPIUM-ALBUTEROL 0.5-2.5 (3) MG/3ML IN SOLN: 3.0000 mL | Freq: Four times a day (QID) | RESPIRATORY_TRACT | Status: DC | PRN

## 2018-08-27 MED ORDER — ONDANSETRON HCL 4 MG/2ML IJ SOLN: 4.0000 mg | Freq: Four times a day (QID) | INTRAMUSCULAR | Status: DC | PRN

## 2018-08-27 MED ORDER — ISOSORBIDE MONONITRATE ER 30 MG PO TB24
60.0000 mg | ORAL_TABLET | Freq: Every day | ORAL | Status: DC
  Administered 2018-08-28 – 2018-08-30 (×3): 60 mg via ORAL
  Filled 2018-08-27 (×3): qty 2

## 2018-08-27 MED ORDER — SODIUM CHLORIDE 0.9 % IV BOLUS
1000.0000 mL | Freq: Once | INTRAVENOUS | Status: AC
  Administered 2018-08-27: 1000 mL via INTRAVENOUS

## 2018-08-27 MED ORDER — LATANOPROST 0.005 % OP SOLN
1.0000 [drp] | Freq: Every day | OPHTHALMIC | Status: DC
  Filled 2018-08-27: qty 2.5

## 2018-08-27 MED ORDER — ACETAMINOPHEN 650 MG RE SUPP: 650.0000 mg | Freq: Four times a day (QID) | RECTAL | Status: DC | PRN

## 2018-08-27 MED ORDER — FLUTICASONE PROPIONATE 50 MCG/ACT NA SUSP
2.0000 | Freq: Every day | NASAL | Status: DC
  Filled 2018-08-27: qty 16

## 2018-08-27 MED ORDER — METOPROLOL TARTRATE 5 MG/5ML IV SOLN
5.0000 mg | Freq: Four times a day (QID) | INTRAVENOUS | Status: DC | PRN
  Administered 2018-08-27: 5 mg via INTRAVENOUS
  Filled 2018-08-27: qty 5

## 2018-08-27 MED ORDER — SODIUM CHLORIDE 0.9 % IV SOLN
1.0000 g | Freq: Once | INTRAVENOUS | Status: AC
  Administered 2018-08-27: 1 g via INTRAVENOUS
  Filled 2018-08-27: qty 1

## 2018-08-27 MED ORDER — POTASSIUM CHLORIDE CRYS ER 20 MEQ PO TBCR
40.0000 meq | EXTENDED_RELEASE_TABLET | Freq: Once | ORAL | Status: AC
  Administered 2018-08-27: 40 meq via ORAL
  Filled 2018-08-27: qty 2

## 2018-08-27 MED ORDER — GABAPENTIN 300 MG PO CAPS
300.0000 mg | ORAL_CAPSULE | Freq: Two times a day (BID) | ORAL | Status: DC
  Administered 2018-08-28 – 2018-08-30 (×5): 300 mg via ORAL
  Filled 2018-08-27 (×5): qty 1

## 2018-08-27 MED ORDER — POLYETHYLENE GLYCOL 3350 17 G PO PACK: 17.0000 g | PACK | Freq: Every day | ORAL | Status: DC | PRN

## 2018-08-27 MED ORDER — MONTELUKAST SODIUM 10 MG PO TABS: 10.0000 mg | ORAL_TABLET | Freq: Every evening | ORAL | Status: DC | PRN

## 2018-08-27 MED ORDER — RIVAROXABAN 20 MG PO TABS
20.0000 mg | ORAL_TABLET | Freq: Every day | ORAL | Status: DC
  Administered 2018-08-28 – 2018-08-29 (×2): 20 mg via ORAL
  Filled 2018-08-27 (×4): qty 1

## 2018-08-27 MED ORDER — ACETAMINOPHEN 325 MG PO TABS
ORAL_TABLET | ORAL | Status: AC
  Filled 2018-08-27: qty 2

## 2018-08-27 MED ORDER — SODIUM CHLORIDE 0.9 % IV SOLN
500.0000 mg | Freq: Once | INTRAVENOUS | Status: AC
  Administered 2018-08-27: 500 mg via INTRAVENOUS
  Filled 2018-08-27: qty 500

## 2018-08-27 MED ORDER — BENZONATATE 100 MG PO CAPS
200.0000 mg | ORAL_CAPSULE | Freq: Three times a day (TID) | ORAL | Status: DC | PRN
  Administered 2018-08-28 (×2): 200 mg via ORAL
  Filled 2018-08-27 (×2): qty 2

## 2018-08-27 MED ORDER — PANTOPRAZOLE SODIUM 40 MG PO TBEC
40.0000 mg | DELAYED_RELEASE_TABLET | Freq: Every day | ORAL | Status: DC
  Administered 2018-08-28: 10:00:00 40 mg via ORAL
  Filled 2018-08-27 (×2): qty 1

## 2018-08-27 MED ORDER — HYDROXYCHLOROQUINE SULFATE 200 MG PO TABS
200.0000 mg | ORAL_TABLET | ORAL | Status: DC
  Filled 2018-08-27: qty 1

## 2018-08-27 MED ORDER — ACETAMINOPHEN 325 MG PO TABS
650.0000 mg | ORAL_TABLET | Freq: Four times a day (QID) | ORAL | Status: DC | PRN
  Administered 2018-08-27 – 2018-08-28 (×3): 650 mg via ORAL
  Filled 2018-08-27 (×2): qty 2

## 2018-08-27 MED ORDER — ONDANSETRON HCL 4 MG PO TABS: 4.0000 mg | ORAL_TABLET | Freq: Four times a day (QID) | ORAL | Status: DC | PRN

## 2018-08-27 MED ORDER — ALBUTEROL SULFATE (2.5 MG/3ML) 0.083% IN NEBU: 2.5000 mg | INHALATION_SOLUTION | Freq: Four times a day (QID) | RESPIRATORY_TRACT | Status: DC | PRN

## 2018-08-27 MED ORDER — METOPROLOL SUCCINATE ER 50 MG PO TB24
100.0000 mg | ORAL_TABLET | Freq: Every day | ORAL | Status: DC
  Administered 2018-08-28 – 2018-08-30 (×3): 100 mg via ORAL
  Filled 2018-08-27 (×3): qty 2

## 2018-08-27 NOTE — ED Notes (Signed)
Date and time results received: 08/27/18 1210 (use smartphrase ".now" to insert current time)  Test: Lactic Critical Value: 2.0  Name of Provider Notified: Cinda Quest MD  Orders Received? Or Actions Taken?: No new orders at this time.

## 2018-08-27 NOTE — Progress Notes (Signed)
Pt declined cpap. States he does not use one any more.

## 2018-08-27 NOTE — ED Notes (Signed)
Pt given meal tray at this time 

## 2018-08-27 NOTE — ED Notes (Signed)
Called pt's daughter w/update.

## 2018-08-27 NOTE — Progress Notes (Signed)
Family Meeting Note  Advance Directive:no  Today a meeting took place with the Patient.  Patient is able to participate.  The following clinical team members were present during this meeting:MD  The following were discussed:Patient's diagnosis: sepsis 2/2 cap, Patient's progosis: Unable to determine and Goals for treatment: Full Code  Additional follow-up to be provided: prn  Time spent during discussion:20 minutes  Evette Doffing, MD

## 2018-08-27 NOTE — ED Triage Notes (Signed)
Pt sent from [facility] at Dr. Tillman Sers office. Per MD, pt was seen there 2 days ago with fever, SOB and cough. MD reports pt returned today and was worse with a rapid heart rate, fever of 102.0 and increased weakness. MD reports they did a CXR that showed some enlargement. MD also reports negative flu test and negative COVID test with results that came last night. MD concerned for COVID or Sepsis.

## 2018-08-27 NOTE — ED Notes (Signed)
Pt reports cough/congestion/SHOB since Monday with max temp at home of 104.1 - he was seen by PCP several days ago and returned today d/t sxs worse - at this time pt with tachypnea, RR 30, tachycardia HR 128, temp 103.4 in room

## 2018-08-27 NOTE — ED Notes (Signed)
Patient transported to CT 

## 2018-08-27 NOTE — ED Notes (Signed)
Report given to Megan RN

## 2018-08-27 NOTE — ED Notes (Signed)
ED TO INPATIENT HANDOFF REPORT  ED Nurse Name and Phone #:  Tish Begin/Sam [phone]  S Name/Age/Gender Martin Adkins 73 y.o. male Room/Bed: ED34A/ED34A  Code Status   Code Status: Full Code  Home/SNF/Other Home Patient oriented to: self, place, time and situation Is this baseline? Yes   Triage Complete: Triage complete  Chief Complaint fever  Triage Note Pt sent from [facility] at Dr. Tillman Sers office. Per MD, pt was seen there 2 days ago with fever, SOB and cough. MD reports pt returned today and was worse with a rapid heart rate, fever of 102.0 and increased weakness. MD reports they did a CXR that showed some enlargement. MD also reports negative flu test and negative COVID test with results that came last night. MD concerned for COVID or Sepsis.    Allergies Allergies  Allergen Reactions  . Ace Inhibitors Other (See Comments)    Other reaction(s): Cough  . Pravastatin Other (See Comments)    Other reaction(s): Muscle Pain  . Rosuvastatin Other (See Comments)    Other reaction(s): Other (See Comments) GI bleed  . Simvastatin Other (See Comments)    Other reaction(s): Muscle Pain  . Amoxicillin Rash  . Niacin Rash  . Penicillins Rash    Has patient had a PCN reaction causing immediate rash, facial/tongue/throat swelling, SOB or lightheadedness with hypotension: Yes Has patient had a PCN reaction causing severe rash involving mucus membranes or skin necrosis: Unknown Has patient had a PCN reaction that required hospitalization: Unknown Has patient had a PCN reaction occurring within the last 10 years: No If all of the above answers are "NO", then may proceed with Cephalosporin use.    Level of Care/Admitting Diagnosis ED Disposition    ED Disposition Condition [facility] Area: [place] [[id number]]  Level of Care: Med-Surg [16]  Covid Evaluation: Confirmed COVID Negative  Date Laboratory Confirmed COVID Negative: 08/27/2018  Diagnosis:  Sepsis [facility]) [[id number]]  Admitting Physician: Hyman Bible DODD [[id number]]  Attending Physician: Hyman Bible DODD [[id number]]  Estimated length of stay: past midnight tomorrow  Certification:: I certify this patient will need inpatient services for at least 2 midnights  PT Class (Do Not Modify): Inpatient [101]  PT Acc Code (Do Not Modify): Private [1]       B Medical/Surgery History Past Medical History:  Diagnosis Date  . AAA (abdominal aortic aneurysm) without rupture ([place]) 04/05/2014  . AAA (abdominal aortic aneurysm) without rupture ([place]) 04/05/2014  . Abdominal aortic aneurysm without rupture ([place])   . Acute non-ST elevation myocardial infarction (NSTEMI) ([place]) 08/15/2014  . 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/28/2014  . APS (antiphospholipid syndrome) ([place])   . Arteriosclerosis of coronary artery 04/05/2014   Overview:  Sp cabg with lima to lad svg to d1 om1 rca 2004 with occluded svg to rca and d1 and pci stetn of om1 graft 2015   . Arthritis   . B12 deficiency 03/21/2017  . Barrett's esophagus   . Benign essential HTN 05/02/2014  . BPH (benign prostatic hyperplasia)   . CAD (coronary artery disease)   . CHF (congestive heart failure) ([place])   . Chronic systolic heart failure ([place]) 04/19/2014   Overview:  With segmental LV systolic dysfunction ejection fraction of 35%   . CLL (chronic lymphocytic leukemia) ([place])   . CLL (chronic lymphocytic leukemia) ([place]) 05/24/2017  . DVT (deep venous thrombosis) ([place])   . Dysrhythmia    ATRIAL FIB.  Marland Kitchen GERD (gastroesophageal reflux disease)   .  History of hiatal hernia   . History of shingles 2013  . Hyperlipemia   . Hyperplastic polyps of stomach   . Hypertension   . Myocardial infarction ([place]) 07/30/2014  . NSTEMI (non-ST elevated myocardial infarction) ([place])   . OSA (obstructive sleep apnea)   . Osteoarthritis   . Pre-diabetes   . PVD (peripheral vascular disease) ([place])   . RA (rheumatoid arthritis) ([place])   . SOB (shortness of breath)     Past Surgical History:  Procedure Laterality Date  . ABDOMINAL AORTA STENT    . CHOLECYSTECTOMY    . COLONOSCOPY  11/24/2009  . CORONARY ANGIOPLASTY WITH STENT PLACEMENT  2015  . CORONARY ARTERY BYPASS GRAFT    . ENDOVASCULAR REPAIR/STENT GRAFT N/A 09/24/2017   Procedure: ENDOVASCULAR REPAIR/STENT GRAFT;  Surgeon: Algernon Huxley, MD;  Location: [place] [facility];  Service: Cardiovascular;  Laterality: N/A;  . ESOPHAGOGASTRODUODENOSCOPY  05/26/02  03/23/12  . ESOPHAGOGASTRODUODENOSCOPY (EGD) WITH PROPOFOL N/A 10/28/2016   Procedure: ESOPHAGOGASTRODUODENOSCOPY (EGD) WITH PROPOFOL;  Surgeon: Manya Silvas, MD;  Location: [facility] ENDOSCOPY;  Service: Endoscopy;  Laterality: N/A;  . LEFT HEART CATH AND CORONARY ANGIOGRAPHY N/A 01/19/2018   Procedure: LEFT HEART CATH AND CORONARY ANGIOGRAPHY;  Surgeon: Teodoro Spray, MD;  Location: [place] [facility];  Service: Cardiovascular;  Laterality: N/A;  . PERIPHERAL VASCULAR CATHETERIZATION N/A 09/06/2014   Procedure: IVC Filter Removal;  Surgeon: Katha Cabal, MD;  Location: [place] [facility];  Service: Cardiovascular;  Laterality: N/A;  . TRANSURETHRAL RESECTION OF PROSTATE N/A 02/20/2015   Procedure: TRANSURETHRAL RESECTION OF THE PROSTATE (TURP);  Surgeon: Hollice Espy, MD;  Location: [facility] ORS;  Service: Urology;  Laterality: N/A;     A IV Location/Drains/Wounds Patient Lines/Drains/Airways Status   Active Line/Drains/Airways    Name:   Placement date:   Placement time:   Site:   Days:   Peripheral IV 08/27/18 Right Forearm   08/27/18    1123    Forearm   less than 1   Peripheral IV 08/27/18 Left Forearm   08/27/18    1123    Forearm   less than 1   Sheath 01/19/18 Right Arterial;Femoral   01/19/18    1243    Arterial;Femoral   220   Incision (Closed) 02/20/15 Perineum Other (Comment)   02/20/15    1218     1284          Intake/Output Last 24 hours  Intake/Output Summary (Last 24 hours) at 08/27/2018 1831 Last data filed at  08/27/2018 1722 Gross per 24 hour  Intake 1350 ml  Output 50 ml  Net 1300 ml    Labs/Imaging Results for orders placed or performed during the hospital encounter of 08/27/18 (from the past 48 hour(s))  Lactic acid, plasma     Status: Abnormal   Collection Time: 08/27/18 11:24 AM  Result Value Ref Range   Lactic Acid, Venous 2.0 (HH) 0.5 - 1.9 mmol/L    Comment: CRITICAL RESULT CALLED TO, READ BACK BY AND VERIFIED WITH [facility] BARKER AT 1205 08/27/2018 DAS Performed at [facility], [address] [place], [place] [address]   Comprehensive metabolic panel     Status: Abnormal   Collection Time: 08/27/18 11:25 AM  Result Value Ref Range   Sodium 132 (L) 135 - 145 mmol/L   Potassium 3.2 (L) 3.5 - 5.1 mmol/L   Chloride 98 98 - 111 mmol/L   CO2 20 (L) 22 - 32 mmol/L  Glucose, Bld 136 (H) 70 - 99 mg/dL   BUN 17 8 - 23 mg/dL   Creatinine, Ser 1.27 (H) 0.61 - 1.24 mg/dL   Calcium 8.5 (L) 8.9 - 10.3 mg/dL   Total Protein 7.7 6.5 - 8.1 g/dL   Albumin 3.4 (L) 3.5 - 5.0 g/dL   AST 30 15 - 41 U/L   ALT 16 0 - 44 U/L   Alkaline Phosphatase 136 (H) 38 - 126 U/L   Total Bilirubin 4.0 (H) 0.3 - 1.2 mg/dL   GFR calc non Af Amer 57 (L) >60 mL/min   GFR calc Af Amer >60 >60 mL/min   Anion gap 14 5 - 15    Comment: Performed at [facility], [address] [place], [place] [address]  CBC with Differential     Status: Abnormal   Collection Time: 08/27/18 11:25 AM  Result Value Ref Range   WBC 10.4 4.0 - 10.5 K/uL   RBC 4.39 4.22 - 5.81 MIL/uL   Hemoglobin 13.1 13.0 - 17.0 g/dL   HCT 40.2 39.0 - 52.0 %   MCV 91.6 80.0 - 100.0 fL   MCH 29.8 26.0 - 34.0 pg   MCHC 32.6 30.0 - 36.0 g/dL   RDW 13.6 11.5 - 15.5 %   Platelets 120 (L) 150 - 400 K/uL   nRBC 0.0 0.0 - 0.2 %   Neutrophils Relative % 91 %   Neutro Abs 9.4 (H) 1.7 - 7.7 K/uL   Lymphocytes Relative 2 %   Lymphs Abs 0.2 (L) 0.7 - 4.0 K/uL   Monocytes Relative 6 %   Monocytes Absolute 0.6 0.1 - 1.0 K/uL    Eosinophils Relative 0 %   Eosinophils Absolute 0.0 0.0 - 0.5 K/uL   Basophils Relative 0 %   Basophils Absolute 0.0 0.0 - 0.1 K/uL   Immature Granulocytes 1 %   Abs Immature Granulocytes 0.08 (H) 0.00 - 0.07 K/uL    Comment: Performed at [facility], [address] [place], [place] [address]  Protime-INR     Status: Abnormal   Collection Time: 08/27/18 11:25 AM  Result Value Ref Range   Prothrombin Time 19.8 (H) 11.4 - 15.2 seconds   INR 1.7 (H) 0.8 - 1.2    Comment: (NOTE) INR goal varies based on device and disease states. Performed at [facility], [place],  [address]   Procalcitonin     Status: None   Collection Time: 08/27/18 11:25 AM  Result Value Ref Range   Procalcitonin 1.82 ng/mL    Comment:        Interpretation: PCT > 0.5 ng/mL and <= 2 ng/mL: Systemic infection (sepsis) is possible, but other conditions are known to elevate PCT as well. (NOTE)       Sepsis PCT Algorithm           Lower Respiratory Tract                                      Infection PCT Algorithm    ----------------------------     ----------------------------         PCT < 0.25 ng/mL                PCT < 0.10 ng/mL         Strongly encourage             Strongly discourage  discontinuation of antibiotics    initiation of antibiotics    ----------------------------     -----------------------------       PCT 0.25 - 0.50 ng/mL            PCT 0.10 - 0.25 ng/mL               OR       >80% decrease in PCT            Discourage initiation of                                            antibiotics      Encourage discontinuation           of antibiotics    ----------------------------     -----------------------------         PCT >= 0.50 ng/mL              PCT 0.26 - 0.50 ng/mL                AND       <80% decrease in PCT             Encourage initiation of                                             antibiotics       Encourage continuation            of antibiotics    ----------------------------     -----------------------------        PCT >= 0.50 ng/mL                  PCT > 0.50 ng/mL               AND         increase in PCT                  Strongly encourage                                      initiation of antibiotics    Strongly encourage escalation           of antibiotics                                     -----------------------------                                           PCT <= 0.25 ng/mL                                                 OR                                        >  80% decrease in PCT                                     Discontinue / Do not initiate                                             antibiotics Performed at [facility], [address] [place], [place] [address]   SARS Coronavirus 2 [facility] order, Performed in [facility] hospital lab) Nasopharyngeal Nasopharyngeal Swab     Status: None   Collection Time: 08/27/18 11:27 AM   Specimen: Nasopharyngeal Swab  Result Value Ref Range   SARS Coronavirus 2 NEGATIVE NEGATIVE    Comment: (NOTE) If result is NEGATIVE SARS-CoV-2 target nucleic acids are NOT DETECTED. The SARS-CoV-2 RNA is generally detectable in upper and lower  respiratory specimens during the acute phase of infection. The lowest  concentration of SARS-CoV-2 viral copies this assay can detect is 250  copies / mL. A negative result does not preclude SARS-CoV-2 infection  and should not be used as the sole basis for treatment or other  patient management decisions.  A negative result may occur with  improper specimen collection / handling, submission of specimen other  than nasopharyngeal swab, presence of viral mutation(s) within the  areas targeted by this assay, and inadequate number of viral copies  (<250 copies / mL). A negative result must be combined with clinical  observations, patient history, and epidemiological information. If result is POSITIVE SARS-CoV-2  target nucleic acids are DETECTED. The SARS-CoV-2 RNA is generally detectable in upper and lower  respiratory specimens dur ing the acute phase of infection.  Positive  results are indicative of active infection with SARS-CoV-2.  Clinical  correlation with patient history and other diagnostic information is  necessary to determine patient infection status.  Positive results do  not rule out bacterial infection or co-infection with other viruses. If result is PRESUMPTIVE POSTIVE SARS-CoV-2 nucleic acids MAY BE PRESENT.   A presumptive positive result was obtained on the submitted specimen  and confirmed on repeat testing.  While 2019 novel coronavirus  (SARS-CoV-2) nucleic acids may be present in the submitted sample  additional confirmatory testing may be necessary for epidemiological  and / or clinical management purposes  to differentiate between  SARS-CoV-2 and other Sarbecovirus currently known to infect humans.  If clinically indicated additional testing with an alternate test  methodology [phone]) is advised. The SARS-CoV-2 RNA is generally  detectable in upper and lower respiratory sp ecimens during the acute  phase of infection. The expected result is Negative. Fact Sheet for Patients:  StrictlyIdeas.no Fact Sheet for Healthcare Providers: BankingDealers.co.za This test is not yet approved or cleared by the [place] FDA and has been authorized for detection and/or diagnosis of SARS-CoV-2 by FDA under an Emergency Use Authorization (EUA).  This EUA will remain in effect (meaning this test can be used) for the duration of the COVID-19 declaration under Section 564(b)(1) of the Act, 21 U.S.C. section 360bbb-3(b)(1), unless the authorization is terminated or revoked sooner. Performed at [facility], [address] [place], [place] [address]   Urinalysis, Complete w Microscopic     Status: Abnormal   Collection  Time: 08/27/18 12:14 PM  Result Value Ref Range   Color, Urine  AMBER (A) YELLOW    Comment: BIOCHEMICALS MAY BE AFFECTED BY COLOR   APPearance CLOUDY (A) CLEAR   Specific Gravity, Urine 1.032 (H) 1.005 - 1.030   pH 5.0 5.0 - 8.0   Glucose, UA NEGATIVE NEGATIVE mg/dL   Hgb urine dipstick SMALL (A) NEGATIVE   Bilirubin Urine SMALL (A) NEGATIVE   Ketones, ur NEGATIVE NEGATIVE mg/dL   Protein, ur 100 (A) NEGATIVE mg/dL   Nitrite NEGATIVE NEGATIVE   Leukocytes,Ua NEGATIVE NEGATIVE   RBC / HPF 11-20 0 - 5 RBC/hpf   WBC, UA 0-5 0 - 5 WBC/hpf   Bacteria, UA RARE (A) NONE SEEN   Squamous Epithelial / LPF NONE SEEN 0 - 5   Mucus PRESENT    Granular Casts, UA PRESENT     Comment: Performed at [facility], [address] [place], [place] [address]  Lactic acid, plasma     Status: None   Collection Time: 08/27/18  1:24 PM  Result Value Ref Range   Lactic Acid, Venous 1.5 0.5 - 1.9 mmol/L    Comment: Performed at [facility], [address] [place], [place] [address]  Influenza panel by PCR (type A & B)     Status: None   Collection Time: 08/27/18  2:33 PM  Result Value Ref Range   Influenza A By PCR NEGATIVE NEGATIVE   Influenza B By PCR NEGATIVE NEGATIVE    Comment: (NOTE) The Xpert Xpress Flu assay is intended as an aid in the diagnosis of  influenza and should not be used as a sole basis for treatment.  This  assay is FDA approved for nasopharyngeal swab specimens only. Nasal  washings and aspirates are unacceptable for Xpert Xpress Flu testing. Performed at [facility], [address] [place], [place] [address]    Ct Chest W Contrast  Result Date: 08/27/2018 CLINICAL DATA:  Fever, cough, shortness of breath. EXAM: CT CHEST WITH CONTRAST TECHNIQUE: Multidetector CT imaging of the chest was performed during intravenous contrast administration. CONTRAST:  70mL OMNIPAQUE IOHEXOL 300 MG/ML  SOLN COMPARISON:  Radiograph same day.  CT scan of January 18, 2018. FINDINGS: Cardiovascular: Status post coronary bypass graft. Atherosclerosis of thoracic aorta is noted. 4.3 cm ascending thoracic aortic aneurysm is noted. 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 Normal cardiac size. No pericardial effusion. Mediastinum/Nodes: No enlarged mediastinal, hilar, or axillary lymph nodes. Thyroid gland, trachea, and esophagus demonstrate no significant findings. Lungs/Pleura: No pneumothorax or pleural effusion is noted. Left lung is unremarkable. There is interval development of large airspace opacity seen involving the right lower lobe consistent with pneumonia. Smaller ground-glass opacity seen medially in the right upper lobe which may represent focal inflammation. Upper Abdomen: No acute abnormality. Musculoskeletal: No chest wall abnormality. No acute or significant osseous findings. IMPRESSION: Large right lower lobe airspace opacity is noted consistent with pneumonia. Smaller ground-glass opacity seen in the right upper lobe medially. Initial follow-up by chest CT without contrast is recommended in 3 months to confirm persistence. This recommendation follows the consensus statement: Recommendations for the Management of Subsolid Pulmonary Nodules Detected at CT: A Statement from the [employer] as published in Radiology 2013; 266:304-317. Status post coronary artery bypass graft. 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 Grossly stable 4.3 cm ascending thoracic aortic aneurysm. Recommend annual imaging followup by CTA or MRA. This recommendation follows 2010 ACCF/AHA/AATS/ACR/ASA/SCA/SCAI/SIR/STS/SVM Guidelines for the  Diagnosis and Management of Patients with Thoracic Aortic Disease. Circulation. 2010; 121: L976-B341. Aortic aneurysm NOS (ICD10-I71.9). Aortic Atherosclerosis (ICD10-I70.0). Electronically Signed   By: Marijo Conception M.D.   On:  08/27/2018 13:59   Dg Chest Port 1 View  Result Date: 08/27/2018 CLINICAL DATA:  Fever, shortness breath, and cough.  Tachycardia. EXAM: PORTABLE CHEST 1 VIEW COMPARISON:  02/27/2018 FINDINGS: Prior CABG. Coronary stent noted. Atherosclerotic calcification of the aortic arch. Prominence of the ascending thoracic aorta. Abnormal accentuated right hilar density, new compared to 02/27/1998. Mild bandlike density along the right lung base suggesting atelectasis or scarring. IMPRESSION: 1. There is new right perihilar density which could be from adenopathy, infiltrate, or atelectasis. This could be further characterized with chest CT if clinically warranted. Prominent ascending thoracic aorta suggesting ascending thoracic aortic aneurysm. 2. Atherosclerosis and prior CABG. 3. Right basilar scarring versus subsegmental atelectasis. Electronically Signed   By: Van Clines M.D.   On: 08/27/2018 12:13    Pending Labs Unresulted Labs (From admission, onward)    Start     Ordered   08/28/18 9379  Basic metabolic panel  Tomorrow morning,   STAT     08/27/18 1709   08/28/18 0500  CBC  Tomorrow morning,   STAT     08/27/18 1709   08/27/18 1710  HIV antibody (Routine Testing)  Once,   STAT     08/27/18 1709   08/27/18 1124  Culture, blood (Routine x 2)  BLOOD CULTURE X 2,   STAT     08/27/18 1123          Vitals/Pain Today's Vitals   08/27/18 1634 08/27/18 1722 08/27/18 1733 08/27/18 1830  BP: (!) 143/89  136/78 111/77  Pulse: (!) 117 (!) 130  (!) 101  Resp: (!) 29 (!) 25  (!) 28  Temp: (!) 103 F (39.4 C) (!) 103 F (39.4 C)  100.2 F (37.9 C)  TempSrc: Oral Oral  Oral  SpO2: 96% 93%  97%  Weight:      Height:      PainSc:        Isolation Precautions No active isolations  Medications Medications  hydroxychloroquine (PLAQUENIL) tablet 200 mg (has no administration in time range)  amLODipine (NORVASC) tablet 10 mg (has no administration in time range)  atorvastatin (LIPITOR)  tablet 80 mg (has no administration in time range)  isosorbide mononitrate (IMDUR) 24 hr tablet 60 mg (has no administration in time range)  metoprolol succinate (TOPROL-XL) 24 hr tablet 100 mg (has no administration in time range)  pantoprazole (PROTONIX) EC tablet 40 mg (has no administration in time range)  rivaroxaban (XARELTO) tablet 20 mg (has no administration in time range)  gabapentin (NEURONTIN) capsule 300 mg (has no administration in time range)  albuterol (PROVENTIL) (2.5 MG/3ML) 0.083% nebulizer solution 2.5 mg (has no administration in time range)  fluticasone (FLONASE) 50 MCG/ACT nasal spray 2 spray (has no administration in time range)  montelukast (SINGULAIR) tablet 10 mg (has no administration in time range)  dorzolamide (TRUSOPT) 2 % ophthalmic solution 1 drop (has no administration in time range)  latanoprost (XALATAN) 0.005 % ophthalmic solution 1 drop (has no administration in time range)  0.9 %  sodium chloride infusion ( Intravenous New Bag/Given 08/27/18 1733)  acetaminophen (TYLENOL) tablet 650 mg (650 mg Oral Given 08/27/18 1718)    Or  acetaminophen (TYLENOL) suppository 650 mg ( Rectal See Alternative 08/27/18 1718)  polyethylene glycol (MIRALAX / GLYCOLAX) packet 17 g (has  no administration in time range)  ondansetron (ZOFRAN) tablet 4 mg (has no administration in time range)    Or  ondansetron (ZOFRAN) injection 4 mg (has no administration in time range)  cefTRIAXone (ROCEPHIN) 1 g in sodium chloride 0.9 % 100 mL IVPB (has no administration in time range)  azithromycin (ZITHROMAX) 500 mg in sodium chloride 0.9 % 250 mL IVPB (has no administration in time range)  benzonatate (TESSALON) capsule 200 mg (has no administration in time range)  guaiFENesin-dextromethorphan (ROBITUSSIN DM) 100-10 MG/5ML syrup 5 mL (has no administration in time range)  ipratropium-albuterol (DUONEB) 0.5-2.5 (3) MG/3ML nebulizer solution 3 mL (has no administration in time range)   metoprolol tartrate (LOPRESSOR) injection 5 mg (5 mg Intravenous Given 08/27/18 1746)  azithromycin (ZITHROMAX) 500 mg in sodium chloride 0.9 % 250 mL IVPB (0 mg Intravenous Stopped 08/27/18 1423)  ceFEPIme (MAXIPIME) 1 g in sodium chloride 0.9 % 100 mL IVPB (0 g Intravenous Stopped 08/27/18 1312)  sodium chloride 0.9 % bolus 1,000 mL (0 mLs Intravenous Stopped 08/27/18 1423)  iohexol (OMNIPAQUE) 300 MG/ML solution 75 mL (75 mLs Intravenous Contrast Given 08/27/18 1341)  potassium chloride SA (K-DUR) CR tablet 40 mEq (40 mEq Oral Given 08/27/18 1719)    Mobility walks Low fall risk   Focused Assessments See H&P   R Recommendations: See Admitting Provider Note  Report given to:   Additional Notes:  No home O2 requirements.

## 2018-08-27 NOTE — H&P (Addendum)
[name] NAME: Martin Adkins    MR#:  [id number]  DATE OF BIRTH:  26-Sep-1945  DATE OF ADMISSION:  08/27/2018  PRIMARY CARE PHYSICIAN: [name], MD   REQUESTING/REFERRING PHYSICIAN: Conni Slipper, MD  CHIEF COMPLAINT:   Chief Complaint  Patient presents with  . Tachycardia  . Cough  . Fever    HISTORY OF PRESENT ILLNESS:  Martin Adkins  is a 73 y.o. male with a known history of AAA, CAD s/p NSTEMI, hypertension, CHF, hyperlipidemia, prediabetes, OSA who presented to the ED with shortness of breath and cough productive of clear/yellow sputum over the last couple of weeks.  His symptoms have been getting progressively worse.  He endorses fever to 104F yesterday.  He denies any chills, nausea, vomiting.  He denies any urinary symptoms.  He denies any chest pain.  He was seen at his PCPs office yesterday and had negative flu and COVID tests.  In the ED, he was meeting sepsis criteria with fever 103.8F, tachycardia, and tachypnea.  Labs are significant for potassium 3.2, creatinine 1.27, lactic acid 2.0.  UA with rare bacteria, but no leukocytes and no positive nitrites.  Chest x-ray showed a new right perihilar density with possible ascending thoracic aortic aneurysm.  Blood cultures were drawn.  Empiric antibiotics were started.  Hospitalists were called for admission.  PAST MEDICAL HISTORY:   Past Medical History:  Diagnosis Date  . AAA (abdominal aortic aneurysm) without rupture ([place]) 04/05/2014  . AAA (abdominal aortic aneurysm) without rupture ([place]) 04/05/2014  . Abdominal aortic aneurysm without rupture ([place])   . Acute non-ST elevation myocardial infarction (NSTEMI) ([place]) 08/15/2014  . 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/28/2014  . APS (antiphospholipid syndrome) ([place])   . Arteriosclerosis of coronary artery 04/05/2014   Overview:  Sp cabg with lima to lad svg to d1 om1 rca 2004 with occluded svg to rca and d1 and  pci stetn of om1 graft 2015   . Arthritis   . B12 deficiency 03/21/2017  . Barrett's esophagus   . Benign essential HTN 05/02/2014  . BPH (benign prostatic hyperplasia)   . CAD (coronary artery disease)   . CHF (congestive heart failure) ([place])   . Chronic systolic heart failure ([place]) 04/19/2014   Overview:  With segmental LV systolic dysfunction ejection fraction of 35%   . CLL (chronic lymphocytic leukemia) ([place])   . CLL (chronic lymphocytic leukemia) ([place]) 05/24/2017  . DVT (deep venous thrombosis) ([place])   . Dysrhythmia    ATRIAL FIB.  Marland Kitchen GERD (gastroesophageal reflux disease)   . History of hiatal hernia   . History of shingles 2013  . Hyperlipemia   . Hyperplastic polyps of stomach   . Hypertension   . Myocardial infarction ([place]) 07/30/2014  . NSTEMI (non-ST elevated myocardial infarction) ([place])   . OSA (obstructive sleep apnea)   . Osteoarthritis   . Pre-diabetes   . PVD (peripheral vascular disease) ([place])   . RA (rheumatoid arthritis) ([place])   . SOB (shortness of breath)     PAST SURGICAL HISTORY:   Past Surgical History:  Procedure Laterality Date  . ABDOMINAL AORTA STENT    . CHOLECYSTECTOMY    . COLONOSCOPY  11/24/2009  . CORONARY ANGIOPLASTY WITH STENT PLACEMENT  2015  . CORONARY ARTERY BYPASS GRAFT    . ENDOVASCULAR REPAIR/STENT GRAFT N/A 09/24/2017   Procedure: ENDOVASCULAR REPAIR/STENT GRAFT;  Surgeon: Algernon Huxley, MD;  Location: [facility];  Service: Cardiovascular;  Laterality: N/A;  . ESOPHAGOGASTRODUODENOSCOPY  05/26/02  03/23/12  . ESOPHAGOGASTRODUODENOSCOPY (EGD) WITH PROPOFOL N/A 10/28/2016   Procedure: ESOPHAGOGASTRODUODENOSCOPY (EGD) WITH PROPOFOL;  Surgeon: Manya Silvas, MD;  Location: [facility] ENDOSCOPY;  Service: Endoscopy;  Laterality: N/A;  . LEFT HEART CATH AND CORONARY ANGIOGRAPHY N/A 01/19/2018   Procedure: LEFT HEART CATH AND CORONARY ANGIOGRAPHY;  Surgeon: Teodoro Spray, MD;  Location: [facility];  Service: Cardiovascular;   Laterality: N/A;  . PERIPHERAL VASCULAR CATHETERIZATION N/A 09/06/2014   Procedure: IVC Filter Removal;  Surgeon: Katha Cabal, MD;  Location: [facility];  Service: Cardiovascular;  Laterality: N/A;  . TRANSURETHRAL RESECTION OF PROSTATE N/A 02/20/2015   Procedure: TRANSURETHRAL RESECTION OF THE PROSTATE (TURP);  Surgeon: Hollice Espy, MD;  Location: [facility] ORS;  Service: Urology;  Laterality: N/A;    SOCIAL HISTORY:   Social History   Tobacco Use  . Smoking status: Former Smoker    Packs/day: 0.50    Years: 20.00    Pack years: 10.00    Types: Cigarettes  . Smokeless tobacco: Never Used  . Tobacco comment: Quit   Substance Use Topics  . Alcohol use: Yes    Alcohol/week: 1.0 - 2.0 standard drinks    Types: 1 - 2 Shots of liquor per week    Comment: social on weekends    FAMILY HISTORY:   Family History  Problem Relation Age of Onset  . Cancer Mother 54       breast ca  . Diabetes Mother   . Coronary artery disease Father   . Heart attack Father   . Prostate cancer Brother   . Bladder Cancer Neg Hx   . Kidney cancer Neg Hx     DRUG ALLERGIES:   Allergies  Allergen Reactions  . Ace Inhibitors Other (See Comments)    Other reaction(s): Cough  . Pravastatin Other (See Comments)    Other reaction(s): Muscle Pain  . Rosuvastatin Other (See Comments)    Other reaction(s): Other (See Comments) GI bleed  . Simvastatin Other (See Comments)    Other reaction(s): Muscle Pain  . Amoxicillin Rash  . Niacin Rash  . Penicillins Rash    Has patient had a PCN reaction causing immediate rash, facial/tongue/throat swelling, SOB or lightheadedness with hypotension: Yes Has patient had a PCN reaction causing severe rash involving mucus membranes or skin necrosis: Unknown Has patient had a PCN reaction that required hospitalization: Unknown Has patient had a PCN reaction occurring within the last 10 years: No If all of the above answers are "NO", then may proceed  with Cephalosporin use.    REVIEW OF SYSTEMS:   Review of Systems  Constitutional: Positive for fever. Negative for chills.  HENT: Negative for congestion and sore throat.   Eyes: Negative for blurred vision and double vision.  Respiratory: Positive for cough, sputum production and shortness of breath.   Cardiovascular: Negative for chest pain, palpitations and leg swelling.  Gastrointestinal: Negative for nausea and vomiting.  Genitourinary: Negative for dysuria and urgency.  Musculoskeletal: Negative for back pain and neck pain.  Neurological: Negative for dizziness and headaches.  Psychiatric/Behavioral: Negative for depression. The patient is not nervous/anxious.     MEDICATIONS AT HOME:   Prior to Admission medications   Medication Sig Start Date End Date Taking? Authorizing Provider  amLODipine (NORVASC) 10 MG tablet Take 10 mg by mouth daily.  11/19/14  Yes [provider]  atorvastatin (LIPITOR) 80 MG tablet Take  80 mg by mouth daily.    Yes [provider]  fluticasone (FLONASE) 50 MCG/ACT nasal spray Place 2 sprays into both nostrils daily.  05/24/14  Yes [provider]  gabapentin (NEURONTIN) 300 MG capsule Take 1 capsule (300 mg total) by mouth 3 (three) times daily. Patient taking differently: Take 300 mg by mouth 2 (two) times daily.  06/03/17  Yes Earlie Server, MD  hydroxychloroquine (PLAQUENIL) 200 MG tablet Take 200 mg by mouth every other day.    Yes [provider]  isosorbide mononitrate (IMDUR) 60 MG 24 hr tablet Take 1 tablet (60 mg total) by mouth daily. 01/20/18  Yes Sudini, Alveta Heimlich, MD  latanoprost (XALATAN) 0.005 % ophthalmic solution INSTILL ONE DROP IN [facility] EYE AT BEDTIME 12/29/17  Yes [provider]  metoprolol succinate (TOPROL-XL) 100 MG 24 hr tablet Take 100 mg by mouth daily.  05/17/14  Yes [provider]  pantoprazole (PROTONIX) 40 MG tablet Take 40 mg by mouth daily.  07/07/14  Yes [provider]  potassium chloride SA (K-DUR,KLOR-CON) 20 MEQ tablet Take 20 mEq by mouth daily.  07/25/14  Yes [provider]  rivaroxaban (XARELTO) 20 MG TABS tablet Take 1 tablet (20 mg total) by mouth daily with supper. 02/02/18  Yes Earlie Server, MD  albuterol (PROVENTIL HFA;VENTOLIN HFA) 108 (90 Base) MCG/ACT inhaler Inhale 2 puffs into the lungs every 6 (six) hours as needed for wheezing or shortness of breath. 02/26/15   Nance Pear, MD  dorzolamide (TRUSOPT) 2 % ophthalmic solution INSTILL ONE DROP IN [facility] EYE TWICE DAILY 02/23/18   [provider]  doxycycline (VIBRAMYCIN) 100 MG capsule Take 100 mg by mouth 2 (two) times daily. For 10 days 08/26/18 09/05/18  [provider]  Ipratropium-Albuterol (COMBIVENT) 20-100 MCG/ACT AERS respimat Inhale 1 puff into the lungs every 6 (six) hours. 05/26/17   Verlon Au, NP  montelukast (SINGULAIR) 10 MG tablet Take 1 tablet (10 mg total) by mouth as needed. Take before Gazyva infusion. 05/27/17   Earlie Server, MD  vitamin B-12 (CYANOCOBALAMIN) 1000 MCG tablet TAKE ONE TABLET BY MOUTH ONCE DAILY Patient not taking: Reported on 08/27/2018 06/09/18   Earlie Server, MD      VITAL SIGNS:  Blood pressure 140/79, pulse (!) 116, temperature (!) 103.3 F (39.6 C), temperature source Oral, resp. rate (!) 28, height 6' (1.829 m), weight 94.8 kg, SpO2 92 %.  PHYSICAL EXAMINATION:  Physical Exam  GENERAL:  73 y.o.-year-old patient lying in the bed with no acute distress.  EYES: Pupils equal, round, reactive to light and accommodation. No scleral icterus. Extraocular muscles intact.  HEENT: Head atraumatic, normocephalic. Oropharynx and nasopharynx clear.  NECK:  Supple, no jugular venous distention. No thyroid enlargement, no tenderness.  LUNGS: + Diminished breath sounds in lung bases bilaterally, no wheezing, rales,rhonchi or crepitation. No use of accessory muscles of respiration.  CARDIOVASCULAR: RRR, S1, S2 normal. No murmurs, rubs, or gallops.   ABDOMEN: Soft, nontender, nondistended. Bowel sounds present. No organomegaly or mass.  EXTREMITIES: No pedal edema, cyanosis, or clubbing.  NEUROLOGIC: Cranial nerves II through XII are intact. Muscle strength 5/5 in all extremities. Sensation intact. Gait not checked.  PSYCHIATRIC: The patient is alert and oriented x 3.  SKIN: No obvious rash, lesion, or ulcer. +Dry skin on the lower extremities bilaterally.  LABORATORY PANEL:   CBC Recent Labs  Lab 08/27/18 1125  WBC 10.4  HGB 13.1  HCT 40.2  PLT 120*   ------------------------------------------------------------------------------------------------------------------  Chemistries  Recent Labs  Lab 08/27/18 1125  NA 132*  K 3.2*  CL 98  CO2 20*  GLUCOSE 136*  BUN 17  CREATININE 1.27*  CALCIUM 8.5*  AST 30  ALT 16  ALKPHOS 136*  BILITOT 4.0*   ------------------------------------------------------------------------------------------------------------------  Cardiac Enzymes No results for input(s): TROPONINI in the last 168 hours. ------------------------------------------------------------------------------------------------------------------  RADIOLOGY:  Dg Chest Port 1 View  Result Date: 08/27/2018 CLINICAL DATA:  Fever, shortness breath, and cough.  Tachycardia. EXAM: PORTABLE CHEST 1 VIEW COMPARISON:  02/27/2018 FINDINGS: Prior CABG. Coronary stent noted. Atherosclerotic calcification of the aortic arch. Prominence of the ascending thoracic aorta. Abnormal accentuated right hilar density, new compared to 02/27/1998. Mild bandlike density along the right lung base suggesting atelectasis or scarring. IMPRESSION: 1. There is new right perihilar density which could be from adenopathy, infiltrate, or atelectasis. This could be further characterized with chest CT if clinically warranted. Prominent ascending thoracic aorta suggesting ascending thoracic aortic aneurysm. 2. Atherosclerosis and prior CABG. 3. Right basilar  scarring versus subsegmental atelectasis. Electronically Signed   By: Van Clines M.D.   On: 08/27/2018 12:13      IMPRESSION AND PLAN:   Sepsis and acute hypoxic respiratory failure secondary to community-acquired pneumonia- patient meeting sepsis criteria on admission with fever, tachycardia, tachypnea, and lactic acidosis.  UA not consistent with infection. -COVID negative -Flu pending -CT chest showed a large right lower lobe airspace opacity consistent with pneumonia -Continue antibiotics -Follow-up blood cultures -Check procalcitonin -DuoNebs PRN -Trend lactic acid -Wean O2 as able  Hypokalemia -Replete and recheck  AKI- likely due to sepsis.  Creatinine 1.27 (baseline 0.9-1.0) -Gentle IV fluids -Avoid nephrotoxic agents -Repeat creatinine in the morning  Paroxysmal atrial fibrillation- in sinus tachycardia here -Continue home metoprolol and Xarelto -Lopressor IV as needed  Ground glass opacity in the medial right upper lobe- seen on CT chest -Needs f/u CT chest w/o contrast in 3 months to confirm persistence  Thoracic ascending aortic aneurysm- stable at 4.3 cm -Needs annual imaging follow-up by CTA or MRI  Hypertension-BP normal in the ED -Continue home Norvasc and metoprolol  Antiphospholipid syndrome- stable -Continue home Plaquenil  Hyperlipidemia- stable -Continue home Lipitor  OSA- stable -CPAP qhs  Chronic thrombocytopenia- platelet at baseline -Monitor   All the records are reviewed and case discussed with ED provider. Management plans discussed with the patient, family and they are in agreement.  CODE STATUS: full  TOTAL TIME TAKING CARE OF THIS PATIENT: 45 minutes.    Berna Spare Zakari Bathe M.D on 08/27/2018 at 1:39 PM  Between 7am to 6pm - Pager [phone]  After 6pm go to [email or web address] - Proofreader  [facility]  Office  [phone]  CC: Primary care physician; [name], MD   Note:  This dictation was prepared with Dragon dictation along with smaller phrase technology. Any transcriptional errors that result from this process are unintentional.

## 2018-08-27 NOTE — ED Notes (Signed)
Provider messaged to notify of decreased O2 sat to 88% and pt placed on 2L via n/c

## 2018-08-27 NOTE — ED Provider Notes (Addendum)
[facility] Emergency Department Provider Note   ____________________________________________   First MD Initiated Contact with Patient 08/27/18 1211     (approximate)  I have reviewed the triage vital signs and the nursing notes.   HISTORY  Chief Complaint Tachycardia, Cough, and Fever    HPI Martin Adkins is a 73 y.o. male patient reports he got sick on Monday.  This is 4 days ago.  Complaining of a nonproductive cough aching all over and a high fever that he can get below 100.  He went to see Dr. Wynetta Emery yesterday and had a negative coronavirus and negative flu test.  Comes in today because he feels sicker.        Past Medical History:  Diagnosis Date  . AAA (abdominal aortic aneurysm) without rupture ([place]) 04/05/2014  . AAA (abdominal aortic aneurysm) without rupture ([place]) 04/05/2014  . Abdominal aortic aneurysm without rupture ([place])   . Acute non-ST elevation myocardial infarction (NSTEMI) ([place]) 08/15/2014  . 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/28/2014  . APS (antiphospholipid syndrome) ([place])   . Arteriosclerosis of coronary artery 04/05/2014   Overview:  Sp cabg with lima to lad svg to d1 om1 rca 2004 with occluded svg to rca and d1 and pci stetn of om1 graft 2015   . Arthritis   . B12 deficiency 03/21/2017  . Barrett's esophagus   . Benign essential HTN 05/02/2014  . BPH (benign prostatic hyperplasia)   . CAD (coronary artery disease)   . CHF (congestive heart failure) ([place])   . Chronic systolic heart failure ([place]) 04/19/2014   Overview:  With segmental LV systolic dysfunction ejection fraction of 35%   . CLL (chronic lymphocytic leukemia) ([place])   . CLL (chronic lymphocytic leukemia) ([place]) 05/24/2017  . DVT (deep venous thrombosis) ([place])   . Dysrhythmia    ATRIAL FIB.  Marland Kitchen GERD (gastroesophageal reflux disease)   . History of hiatal hernia   . History of shingles 2013  . Hyperlipemia   . Hyperplastic polyps of stomach   . Hypertension   .  Myocardial infarction ([place]) 07/30/2014  . NSTEMI (non-ST elevated myocardial infarction) ([place])   . OSA (obstructive sleep apnea)   . Osteoarthritis   . Pre-diabetes   . PVD (peripheral vascular disease) ([place])   . RA (rheumatoid arthritis) ([place])   . SOB (shortness of breath)     Patient Active Problem List   Diagnosis Date Noted  . Non-ST elevation MI (NSTEMI) ([place]) 01/18/2018  . AAA (abdominal aortic aneurysm) ([place]) 09/24/2017  . Leaking abdominal aortic aneurysm (AAA) ([place]) 08/22/2017  . CLL (chronic lymphocytic leukemia) ([place]) 05/24/2017  . Goals of care, counseling/discussion 05/24/2017  . B12 deficiency 03/21/2017  . Deep venous thrombosis of profunda femoris vein ([place]) 01/23/2015  . BPH (benign prostatic hyperplasia) 01/23/2015  . Urge urinary incontinence 01/23/2015  . Type 2 diabetes mellitus ([place]) 10/31/2014  . Benign gastric polyp 10/31/2014  . Absence of bladder continence 10/09/2014  . Acute non-ST elevation myocardial infarction (NSTEMI) ([place]) 08/15/2014  . Atrial fibrillation ([place]) 08/05/2014  . Antiphospholipid syndrome ([place]) 08/01/2014  . H/O deep venous thrombosis 08/01/2014  . Myocardial infarction ([place]) 07/30/2014  . Barrett esophagus 07/28/2014  . Diabetes mellitus, type 2 ([place]) 07/28/2014  . 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/28/2014  . Hyperplastic adenomatous polyp of stomach 07/28/2014  . Benign essential HTN 05/02/2014  . Chronic systolic heart failure ([place]) 04/19/2014  . AAA (abdominal aortic aneurysm) without rupture ([place]) 04/05/2014  . Arteriosclerosis of coronary artery 04/05/2014  .  Breathlessness on exertion 04/05/2014  . Abdominal aortic aneurysm (AAA) without rupture ([place]) 04/05/2014  . Acute non-ST segment elevation myocardial infarction ([place]) 05/14/2013  . Acid reflux 05/04/2013  . Combined fat and carbohydrate induced hyperlipemia 05/03/2013  . Obstructive apnea 05/03/2013  . Arthritis, degenerative 05/03/2013  . Peripheral blood vessel  disorder ([place]) 05/03/2013  . Arthritis or polyarthritis, rheumatoid ([place]) 05/03/2013  . Compulsive tobacco user syndrome 05/03/2013  . Rheumatoid arthritis ([place]) 05/03/2013  . Peripheral vascular disease ([place]) 05/03/2013  . Current tobacco use 05/03/2013    Past Surgical History:  Procedure Laterality Date  . ABDOMINAL AORTA STENT    . CHOLECYSTECTOMY    . COLONOSCOPY  11/24/2009  . CORONARY ANGIOPLASTY WITH STENT PLACEMENT  2015  . CORONARY ARTERY BYPASS GRAFT    . ENDOVASCULAR REPAIR/STENT GRAFT N/A 09/24/2017   Procedure: ENDOVASCULAR REPAIR/STENT GRAFT;  Surgeon: Algernon Huxley, MD;  Location: [facility];  Service: Cardiovascular;  Laterality: N/A;  . ESOPHAGOGASTRODUODENOSCOPY  05/26/02  03/23/12  . ESOPHAGOGASTRODUODENOSCOPY (EGD) WITH PROPOFOL N/A 10/28/2016   Procedure: ESOPHAGOGASTRODUODENOSCOPY (EGD) WITH PROPOFOL;  Surgeon: Manya Silvas, MD;  Location: [facility];  Service: Endoscopy;  Laterality: N/A;  . LEFT HEART CATH AND CORONARY ANGIOGRAPHY N/A 01/19/2018   Procedure: LEFT HEART CATH AND CORONARY ANGIOGRAPHY;  Surgeon: Teodoro Spray, MD;  Location: [facility];  Service: Cardiovascular;  Laterality: N/A;  . PERIPHERAL VASCULAR CATHETERIZATION N/A 09/06/2014   Procedure: IVC Filter Removal;  Surgeon: Katha Cabal, MD;  Location: [facility];  Service: Cardiovascular;  Laterality: N/A;  . TRANSURETHRAL RESECTION OF PROSTATE N/A 02/20/2015   Procedure: TRANSURETHRAL RESECTION OF THE PROSTATE (TURP);  Surgeon: Hollice Espy, MD;  Location: [facility];  Service: Urology;  Laterality: N/A;    Prior to Admission medications   Medication Sig Start Date End Date Taking? Authorizing Provider  albuterol (PROVENTIL HFA;VENTOLIN HFA) 108 (90 Base) MCG/ACT inhaler Inhale 2 puffs into the lungs every 6 (six) hours as needed for wheezing or shortness of breath. 02/26/15   Nance Pear, MD  amLODipine (NORVASC) 10 MG tablet Take 10 mg by mouth  daily.  11/19/14   [provider]  atorvastatin (LIPITOR) 80 MG tablet Take 80 mg by mouth daily.     [provider]  dorzolamide (TRUSOPT) 2 % ophthalmic solution INSTILL ONE DROP IN [facility] EYE TWICE DAILY 02/23/18   [provider]  fluticasone (FLONASE) 50 MCG/ACT nasal spray Place 2 sprays into both nostrils daily.  05/24/14   [provider]  gabapentin (NEURONTIN) 300 MG capsule Take 1 capsule (300 mg total) by mouth 3 (three) times daily. Patient taking differently: Take 300 mg by mouth 2 (two) times daily.  06/03/17   Earlie Server, MD  hydroxychloroquine (PLAQUENIL) 200 MG tablet Take 200 mg by mouth every other day.     [provider]  Ipratropium-Albuterol (COMBIVENT) 20-100 MCG/ACT AERS respimat Inhale 1 puff into the lungs every 6 (six) hours. 05/26/17   Verlon Au, NP  isosorbide mononitrate (IMDUR) 60 MG 24 hr tablet Take 1 tablet (60 mg total) by mouth daily. 01/20/18   Sudini, Alveta Heimlich, MD  latanoprost (XALATAN) 0.005 % ophthalmic solution INSTILL ONE DROP IN [facility] EYE AT BEDTIME 12/29/17   [provider]  metoprolol succinate (TOPROL-XL) 100 MG 24 hr tablet Take 100 mg by mouth daily.  05/17/14   [provider]  montelukast (SINGULAIR) 10 MG tablet Take 1 tablet (10 mg total) by mouth  as needed. Take before Gazyva infusion. 05/27/17   Earlie Server, MD  pantoprazole (PROTONIX) 40 MG tablet Take 40 mg by mouth daily.  07/07/14   [provider]  potassium chloride SA (K-DUR,KLOR-CON) 20 MEQ tablet Take 20 mEq by mouth daily.  07/25/14   [provider]  rivaroxaban (XARELTO) 20 MG TABS tablet Take 1 tablet (20 mg total) by mouth daily with supper. 02/02/18   Earlie Server, MD  vitamin B-12 (CYANOCOBALAMIN) 1000 MCG tablet TAKE ONE TABLET BY MOUTH ONCE DAILY 06/09/18   Earlie Server, MD    Allergies Ace inhibitors, Pravastatin, Rosuvastatin, Simvastatin, Amoxicillin, Niacin, and Penicillins  Family History  Problem Relation  Age of Onset  . Cancer Mother 57       breast ca  . Diabetes Mother   . Coronary artery disease Father   . Heart attack Father   . Prostate cancer Brother   . Bladder Cancer Neg Hx   . Kidney cancer Neg Hx     Social History Social History   Tobacco Use  . Smoking status: Former Smoker    Packs/day: 0.50    Years: 20.00    Pack years: 10.00    Types: Cigarettes  . Smokeless tobacco: Never Used  . Tobacco comment: Quit   Substance Use Topics  . Alcohol use: Yes    Alcohol/week: 1.0 - 2.0 standard drinks    Types: 1 - 2 Shots of liquor per week    Comment: social on weekends  . Drug use: No    Review of Systems  Constitutional:  fever/chills Eyes: No visual changes. ENT: No sore throat. Cardiovascular: Denies chest pain. Respiratory: Denies shortness of breath. Gastrointestinal: No abdominal pain.  No nausea, no vomiting.  No diarrhea.  No constipation. Genitourinary: Negative for dysuria. Musculoskeletal: Negative for back pain. Skin: Negative for rash. Neurological: Negative for headaches, focal weakness  ____________________________________________   PHYSICAL EXAM:  VITAL SIGNS: ED Triage Vitals  Enc Vitals Group     BP 08/27/18 1124 139/86     Pulse Rate 08/27/18 1124 (!) 123     Resp 08/27/18 1124 (!) 31     Temp 08/27/18 1124 (!) 103.3 F (39.6 C)     Temp Source 08/27/18 1124 Oral     SpO2 08/27/18 1124 92 %     Weight 08/27/18 1109 209 lb (94.8 kg)     Height 08/27/18 1109 6' (1.829 m)     Head Circumference --      Peak Flow --      Pain Score 08/27/18 1107 10     Pain Loc --      Pain Edu? --      Excl. in [place]? --     Constitutional: Alert and oriented. Well appearing and in no acute distress. Eyes: Conjunctivae are normal.  Head: Atraumatic. Nose: No congestion/rhinnorhea. Mouth/Throat: Mucous membranes are moist.  Oropharynx non-erythematous. Neck: No stridor.   Cardiovascular: Rapid rate, regular rhythm. Grossly normal heart sounds.   Good peripheral circulation. Respiratory: Normal respiratory effort.  No retractions. Lungs crackles in the right middle and lower lung field Gastrointestinal: Soft and nontender. No distention. No abdominal bruits. No CVA tenderness. Musculoskeletal: No lower extremity tenderness nor edema.   Neurologic:  Normal speech and language. No gross focal neurologic deficits are appreciated.  Skin:  Skin is warm, dry and intact. No rash noted. Psychiatric: Mood and affect are normal. Speech and behavior are normal.  ____________________________________________   LABS (all labs ordered  are listed, but only abnormal results are displayed)  Labs Reviewed  COMPREHENSIVE METABOLIC PANEL - Abnormal; Notable for the following components:      Result Value   Sodium 132 (*)    Potassium 3.2 (*)    CO2 20 (*)    Glucose, Bld 136 (*)    Creatinine, Ser 1.27 (*)    Calcium 8.5 (*)    Albumin 3.4 (*)    Alkaline Phosphatase 136 (*)    Total Bilirubin 4.0 (*)    GFR calc non Af Amer 57 (*)    All other components within normal limits  LACTIC ACID, PLASMA - Abnormal; Notable for the following components:   Lactic Acid, Venous 2.0 (*)    All other components within normal limits  CBC WITH DIFFERENTIAL/PLATELET - Abnormal; Notable for the following components:   Platelets 120 (*)    Neutro Abs 9.4 (*)    Lymphs Abs 0.2 (*)    Abs Immature Granulocytes 0.08 (*)    All other components within normal limits  PROTIME-INR - Abnormal; Notable for the following components:   Prothrombin Time 19.8 (*)    INR 1.7 (*)    All other components within normal limits  URINALYSIS, COMPLETE (UACMP) WITH MICROSCOPIC - Abnormal; Notable for the following components:   Color, Urine AMBER (*)    APPearance CLOUDY (*)    Specific Gravity, Urine 1.032 (*)    Hgb urine dipstick SMALL (*)    Bilirubin Urine SMALL (*)    Protein, ur 100 (*)    Bacteria, UA RARE (*)    All other components within normal limits  SARS  CORONAVIRUS 2 (HOSPITAL ORDER, [facility])  CULTURE, BLOOD (ROUTINE X 2)  CULTURE, BLOOD (ROUTINE X 2)  LACTIC ACID, PLASMA  PROCALCITONIN  INFLUENZA PANEL BY PCR (TYPE A & B)   ____________________________________________  EKG  EKG read and interpreted by me shows sinus tachycardia rate of 119 normal axis some flattening laterally in the limb leads nonspecific ST-T changes. ____________________________________________  RADIOLOGY  ED MD interpretation: Rest x-ray read by radiology reviewed by me shows a apparent thoracic aneurysm and right perihilar density.  We will CT this gentleman to see what is going on.  Official radiology report(s): Dg Chest Port 1 View  Result Date: 08/27/2018 CLINICAL DATA:  Fever, shortness breath, and cough.  Tachycardia. EXAM: PORTABLE CHEST 1 VIEW COMPARISON:  02/27/2018 FINDINGS: Prior CABG. Coronary stent noted. Atherosclerotic calcification of the aortic arch. Prominence of the ascending thoracic aorta. Abnormal accentuated right hilar density, new compared to 02/27/1998. Mild bandlike density along the right lung base suggesting atelectasis or scarring. IMPRESSION: 1. There is new right perihilar density which could be from adenopathy, infiltrate, or atelectasis. This could be further characterized with chest CT if clinically warranted. Prominent ascending thoracic aorta suggesting ascending thoracic aortic aneurysm. 2. Atherosclerosis and prior CABG. 3. Right basilar scarring versus subsegmental atelectasis. Electronically Signed   By: Van Clines M.D.   On: 08/27/2018 12:13    ____________________________________________   PROCEDURES  Procedure(s) performed (including Critical Care): Critical care time 20 minutes this includes reviewing the patient's old records and his studies and discussing his case with the hospital doc.  Procedures   ____________________________________________   INITIAL IMPRESSION /  ASSESSMENT AND PLAN / ED COURSE  Patient's COVID test returns is negative.  Patient has a suggestion of pneumonia on the x-ray and some perihilar fullness and possibly an origin aneurysm.  I will  CT his chest with contrast to see if we can determine if he has an aneurysm and what is going on in his hilum.  We will get him in the hospital and treated for sepsis.  He also has some hematuria which is microscopic.     Martin Adkins was evaluated in Emergency Department on 09/08/2018 for the symptoms described in the history of present illness. He was evaluated in the context of the global COVID-19 pandemic, which necessitated consideration that the patient might be at risk for infection with the SARS-CoV-2 virus that causes COVID-19. Institutional protocols and algorithms that pertain to the evaluation of patients at risk for COVID-19 are in a state of rapid change based on information released by regulatory bodies including the CDC and federal and state organizations. These policies and algorithms were followed during the patient's care in the ED.         ____________________________________________   FINAL CLINICAL IMPRESSION(S) / ED DIAGNOSES  Final diagnoses:  Sepsis, due to unspecified organism, unspecified whether acute organ dysfunction present [facility])     ED Discharge Orders    None       Note:  This document was prepared using Dragon voice recognition software and may include unintentional dictation errors.    Nena Polio, MD 08/27/18 1303    Nena Polio, MD 09/08/18 [phone]

## 2018-08-28 LAB — BASIC METABOLIC PANEL
Anion gap: 6 (ref 5–15)
BUN: 15 mg/dL (ref 8–23)
CO2: 23 mmol/L (ref 22–32)
Calcium: 7.9 mg/dL — ABNORMAL LOW (ref 8.9–10.3)
Chloride: 105 mmol/L (ref 98–111)
Creatinine, Ser: 0.95 mg/dL (ref 0.61–1.24)
GFR calc Af Amer: >60 mL/min (ref >60)
GFR calc non Af Amer: >60 mL/min (ref >60)
Glucose, Bld: 105 mg/dL — ABNORMAL HIGH (ref 70–99)
Potassium: 3.1 mmol/L — ABNORMAL LOW (ref 3.5–5.1)
Sodium: 134 mmol/L — ABNORMAL LOW (ref 135–145)

## 2018-08-28 LAB — CBC
HCT: 36.1 % — ABNORMAL LOW (ref 39.0–52.0)
Hemoglobin: 11.8 g/dL — ABNORMAL LOW (ref 13.0–17.0)
MCH: 30.2 pg (ref 26.0–34.0)
MCHC: 32.7 g/dL (ref 30.0–36.0)
MCV: 92.3 fL (ref 80.0–100.0)
Platelets: 95 10*3/uL — ABNORMAL LOW (ref 150–400)
RBC: 3.91 MIL/uL — ABNORMAL LOW (ref 4.22–5.81)
RDW: 13.9 % (ref 11.5–15.5)
WBC: 6.9 10*3/uL (ref 4.0–10.5)
nRBC: 0 % (ref 0.0–0.2)

## 2018-08-28 LAB — MAGNESIUM: Magnesium: 1.8 mg/dL (ref 1.7–2.4)

## 2018-08-28 MED ORDER — ADULT MULTIVITAMIN W/MINERALS CH
1.0000 | ORAL_TABLET | Freq: Every day | ORAL | Status: DC
  Filled 2018-08-28: qty 1

## 2018-08-28 MED ORDER — ENSURE ENLIVE PO LIQD: 237.0000 mL | Freq: Two times a day (BID) | ORAL | Status: DC

## 2018-08-28 MED ORDER — ORAL CARE MOUTH RINSE
15.0000 mL | Freq: Two times a day (BID) | OROMUCOSAL | Status: DC
  Administered 2018-08-28 – 2018-08-29 (×3): 15 mL via OROMUCOSAL

## 2018-08-28 MED ORDER — BISACODYL 5 MG PO TBEC: 5.0000 mg | DELAYED_RELEASE_TABLET | Freq: Every day | ORAL | Status: DC | PRN

## 2018-08-28 MED ORDER — SENNA 8.6 MG PO TABS: 1.0000 | ORAL_TABLET | Freq: Every day | ORAL | Status: DC | PRN

## 2018-08-28 MED ORDER — POTASSIUM CHLORIDE CRYS ER 20 MEQ PO TBCR
40.0000 meq | EXTENDED_RELEASE_TABLET | Freq: Two times a day (BID) | ORAL | Status: AC
  Administered 2018-08-28 (×2): 40 meq via ORAL
  Filled 2018-08-28 (×2): qty 2

## 2018-08-28 NOTE — Progress Notes (Signed)
[facility] [place] NAME: Martin Adkins    MR#:  [id number]  DATE OF BIRTH:  Aug 31, 1945  SUBJECTIVE:  CHIEF COMPLAINT:   Chief Complaint  Patient presents with  . Tachycardia  . Cough  . Fever   The patient complains of fever, chills, cough with sputum.  No oxygen by nasal cannula 2 L. REVIEW OF SYSTEMS:  Review of Systems  Constitutional: Positive for chills and fever. Negative for malaise/fatigue.  HENT: Negative for sore throat.   Eyes: Negative for blurred vision and double vision.  Respiratory: Positive for cough and sputum production. Negative for hemoptysis, shortness of breath, wheezing and stridor.   Cardiovascular: Negative for chest pain, palpitations, orthopnea and leg swelling.  Gastrointestinal: Negative for abdominal pain, blood in stool, diarrhea, melena, nausea and vomiting.  Genitourinary: Negative for dysuria, flank pain and hematuria.  Musculoskeletal: Negative for back pain and joint pain.  Skin: Negative for rash.  Neurological: Negative for dizziness, sensory change, focal weakness, seizures, loss of consciousness, weakness and headaches.  Endo/Heme/Allergies: Negative for polydipsia.  Psychiatric/Behavioral: Negative for depression. The patient is not nervous/anxious.     DRUG ALLERGIES:   Allergies  Allergen Reactions  . Ace Inhibitors Other (See Comments)    Other reaction(s): Cough  . Pravastatin Other (See Comments)    Other reaction(s): Muscle Pain  . Rosuvastatin Other (See Comments)    Other reaction(s): Other (See Comments) GI bleed  . Simvastatin Other (See Comments)    Other reaction(s): Muscle Pain  . Amoxicillin Rash  . Niacin Rash  . Penicillins Rash    Has patient had a PCN reaction causing immediate rash, facial/tongue/throat swelling, SOB or lightheadedness with hypotension: Yes Has patient had a PCN reaction causing severe rash involving mucus membranes or skin necrosis: Unknown  Has patient had a PCN reaction that required hospitalization: Unknown Has patient had a PCN reaction occurring within the last 10 years: No If all of the above answers are "NO", then may proceed with Cephalosporin use.   VITALS:  Blood pressure 118/73, pulse 97, temperature (!) 101.1 F (38.4 C), temperature source Oral, resp. rate 19, height 6' (1.829 m), weight 88.6 kg, SpO2 97 %. PHYSICAL EXAMINATION:  Physical Exam Constitutional:      General: He is not in acute distress. HENT:     Head: Normocephalic.     Mouth/Throat:     Mouth: Mucous membranes are moist.  Eyes:     General: No scleral icterus.    Conjunctiva/sclera: Conjunctivae normal.     Pupils: Pupils are equal, round, and reactive to light.  Neck:     Musculoskeletal: Normal range of motion and neck supple.     Vascular: No JVD.     Trachea: No tracheal deviation.  Cardiovascular:     Rate and Rhythm: Normal rate and regular rhythm.     Heart sounds: Normal heart sounds. No murmur. No gallop.   Pulmonary:     Effort: Pulmonary effort is normal. No respiratory distress.     Breath sounds: Normal breath sounds. No wheezing or rales.  Abdominal:     General: Bowel sounds are normal. There is no distension.     Palpations: Abdomen is soft.     Tenderness: There is no abdominal tenderness. There is no rebound.  Musculoskeletal: Normal range of motion.        General: No tenderness.     Right lower leg: No edema.  Left lower leg: No edema.  Skin:    Findings: No erythema or rash.  Neurological:     Mental Status: He is alert and oriented to person, place, and time.     Cranial Nerves: No cranial nerve deficit.  Psychiatric:        Mood and Affect: Mood normal.    LABORATORY PANEL:  Male CBC Recent Labs  Lab 08/28/18 0450  WBC 6.9  HGB 11.8*  HCT 36.1*  PLT 95*   ------------------------------------------------------------------------------------------------------------------ Chemistries  Recent  Labs  Lab 08/27/18 1125 08/28/18 0450  NA 132* 134*  K 3.2* 3.1*  CL 98 105  CO2 20* 23  GLUCOSE 136* 105*  BUN 17 15  CREATININE 1.27* 0.95  CALCIUM 8.5* 7.9*  MG  --  1.8  AST 30  --   ALT 16  --   ALKPHOS 136*  --   BILITOT 4.0*  --    RADIOLOGY:  No results found. ASSESSMENT AND PLAN:   Sepsis and acute hypoxic respiratory failure  with hypoxia secondary to community-acquired pneumonia- -COVID negative -Flu negative. -CT chest showed a large right lower lobe airspace opacity consistent with pneumonia -Continue antibiotics -Follow-up blood cultures -Check procalcitonin 1.82 -DuoNebs PRN -Wean O2 as able  Mild lactic acidosis.  Improved.  Hypokalemia Continue potassium supplement.  Hyponatremia.  Continue normal saline IV.  Follow-up sodium level.  AKI- likely due to sepsis.  Creatinine 1.27 (baseline 0.9-1.0) Improved with IV fluid support.  Paroxysmal atrial fibrillation- in sinus tachycardia here -Continue home metoprolol and Xarelto -Lopressor IV as needed  Ground glass opacity in the medial right upper lobe- seen on CT chest -Needs f/u CT chest w/o contrast in 3 months to confirm persistence  Thoracic ascending aortic aneurysm- stable at 4.3 cm -Needs annual imaging follow-up by CTA or MRI  Hypertension -Continue home Norvasc and metoprolol  Antiphospholipid syndrome- stable -Continue home Plaquenil  Hyperlipidemia- stable -Continue home Lipitor  OSA- stable -CPAP qhs  Chronic thrombocytopenia- platelet at baseline  All the records are reviewed and case discussed with Care Management/Social Worker. Management plans discussed with the patient, his daughter and they are in agreement.  CODE STATUS: Full Code  TOTAL TIME TAKING CARE OF THIS PATIENT: 35 minutes.   More than 50% of the time was spent in counseling/coordination of care: YES  POSSIBLE D/C IN 2 DAYS, DEPENDING ON CLINICAL CONDITION.   Martin Adkins M.D on 08/28/2018 at  1:50 PM  Between 7am to 6pm - Pager - [phone]  After 6pm go to [email or web address] - [facility]

## 2018-08-28 NOTE — Progress Notes (Signed)
Tylenol given. Will reassess   08/28/18 1340  Vitals  Temp (!) 102.1 F (38.9 C)  Temp Source Oral

## 2018-08-28 NOTE — Progress Notes (Signed)
Initial Nutrition Assessment  DOCUMENTATION CODES:   Not applicable  INTERVENTION:   Ensure Enlive po BID, each supplement provides 350 kcal and 20 grams of protein  MVI daily   Liberalize diet   NUTRITION DIAGNOSIS:   Inadequate oral intake related to acute illness as evidenced by meal completion < 50%.  GOAL:   Patient will meet greater than or equal to 90% of their needs  MONITOR:   PO intake, Supplement acceptance, Labs, Weight trends, Skin, I & O's  REASON FOR ASSESSMENT:   Malnutrition Screening Tool    ASSESSMENT:   73 y.o. male with a known history of AAA, CAD s/p NSTEMI, hypertension, CHF, hyperlipidemia, prediabetes, OSA who presented to the ED with shortness of breath and cough. Pt found to have CAP and AKI   Pt with decreased appetite and oral intake for ~ 2 weeks pta. Pt currently eating 50% of meals in hospital. RD will add supplements and MVI to help pt meet his estimated needs. RD will also liberalize diet as pt not eating enough to exceed nutrient limits. Per chart, pt down 14lbs(7%) over the past 3 weeks; this is significant given the time frame.   Medications reviewed and include: protonix, KCl, NaCl @75ml /hr, azithromycin, ceftriaxone   Labs reviewed: Na 134(L), Mg 1.8 wnl  Unable to complete Nutrition-Focused physical exam at this time.   Diet Order:   Diet Order            Diet Heart Room service appropriate? Yes; Fluid consistency: Thin  Diet effective now             EDUCATION NEEDS:   No education needs have been identified at this time  Skin:  Skin Assessment: Reviewed RN Assessment  Last BM:  8/19  Height:   Ht Readings from Last 1 Encounters:  08/27/18 6' (1.829 m)    Weight:   Wt Readings from Last 1 Encounters:  08/27/18 88.6 kg    Ideal Body Weight:  80.9 kg  BMI:  Body mass index is 26.49 kg/m.  Estimated Nutritional Needs:   Kcal:  2100-2400kcal/day  Protein:  105-120g/day  Fluid:  >2L/day  Koleen Distance MS, RD, LDN Pager #- [phone] Office#- [phone] After Hours Pager: [phone]

## 2018-08-29 LAB — BASIC METABOLIC PANEL
Anion gap: 9 (ref 5–15)
BUN: 15 mg/dL (ref 8–23)
CO2: 23 mmol/L (ref 22–32)
Calcium: 7.7 mg/dL — ABNORMAL LOW (ref 8.9–10.3)
Chloride: 104 mmol/L (ref 98–111)
Creatinine, Ser: 0.89 mg/dL (ref 0.61–1.24)
GFR calc Af Amer: >60 mL/min (ref >60)
GFR calc non Af Amer: >60 mL/min (ref >60)
Glucose, Bld: 111 mg/dL — ABNORMAL HIGH (ref 70–99)
Potassium: 3.4 mmol/L — ABNORMAL LOW (ref 3.5–5.1)
Sodium: 136 mmol/L (ref 135–145)

## 2018-08-29 LAB — HIV ANTIBODY (ROUTINE TESTING W REFLEX): HIV Screen 4th Generation wRfx: NONREACTIVE

## 2018-08-29 MED ORDER — POTASSIUM CHLORIDE CRYS ER 20 MEQ PO TBCR
40.0000 meq | EXTENDED_RELEASE_TABLET | Freq: Once | ORAL | Status: AC
  Administered 2018-08-29: 40 meq via ORAL
  Filled 2018-08-29: qty 2

## 2018-08-29 MED ORDER — GUAIFENESIN ER 600 MG PO TB12
600.0000 mg | ORAL_TABLET | Freq: Two times a day (BID) | ORAL | Status: DC
  Administered 2018-08-29 – 2018-08-30 (×2): 600 mg via ORAL
  Filled 2018-08-29 (×3): qty 1

## 2018-08-29 NOTE — Progress Notes (Addendum)
[facility] [place] NAME: Martin Adkins    MR#:  [id number]  DATE OF BIRTH:  1945/06/21  SUBJECTIVE:  CHIEF COMPLAINT:   Chief Complaint  Patient presents with  . Tachycardia  . Cough  . Fever   The patient complains of fever, cough with sputum.  Off oxygen REVIEW OF SYSTEMS:  Review of Systems  Constitutional: Positive for fever. Negative for chills and malaise/fatigue.  HENT: Negative for sore throat.   Eyes: Negative for blurred vision and double vision.  Respiratory: Positive for cough and sputum production. Negative for hemoptysis, shortness of breath, wheezing and stridor.   Cardiovascular: Negative for chest pain, palpitations, orthopnea and leg swelling.  Gastrointestinal: Negative for abdominal pain, blood in stool, diarrhea, melena, nausea and vomiting.  Genitourinary: Negative for dysuria, flank pain and hematuria.  Musculoskeletal: Negative for back pain and joint pain.  Skin: Negative for rash.  Neurological: Negative for dizziness, sensory change, focal weakness, seizures, loss of consciousness, weakness and headaches.  Endo/Heme/Allergies: Negative for polydipsia.  Psychiatric/Behavioral: Negative for depression. The patient is not nervous/anxious.     DRUG ALLERGIES:   Allergies  Allergen Reactions  . Ace Inhibitors Other (See Comments)    Other reaction(s): Cough  . Pravastatin Other (See Comments)    Other reaction(s): Muscle Pain  . Rosuvastatin Other (See Comments)    Other reaction(s): Other (See Comments) GI bleed  . Simvastatin Other (See Comments)    Other reaction(s): Muscle Pain  . Amoxicillin Rash  . Niacin Rash  . Penicillins Rash    Has patient had a PCN reaction causing immediate rash, facial/tongue/throat swelling, SOB or lightheadedness with hypotension: Yes Has patient had a PCN reaction causing severe rash involving mucus membranes or skin necrosis: Unknown Has patient had a PCN  reaction that required hospitalization: Unknown Has patient had a PCN reaction occurring within the last 10 years: No If all of the above answers are "NO", then may proceed with Cephalosporin use.   VITALS:  Blood pressure 128/75, pulse 93, temperature 99.7 F (37.6 C), temperature source Oral, resp. rate 20, height 6' (1.829 m), weight 88.6 kg, SpO2 95 %. PHYSICAL EXAMINATION:  Physical Exam Constitutional:      General: He is not in acute distress. HENT:     Head: Normocephalic.     Mouth/Throat:     Mouth: Mucous membranes are moist.  Eyes:     General: No scleral icterus.    Conjunctiva/sclera: Conjunctivae normal.     Pupils: Pupils are equal, round, and reactive to light.  Neck:     Musculoskeletal: Normal range of motion and neck supple.     Vascular: No JVD.     Trachea: No tracheal deviation.  Cardiovascular:     Rate and Rhythm: Normal rate and regular rhythm.     Heart sounds: Normal heart sounds. No murmur. No gallop.   Pulmonary:     Effort: Pulmonary effort is normal. No respiratory distress.     Breath sounds: Normal breath sounds. No wheezing or rales.  Abdominal:     General: Bowel sounds are normal. There is no distension.     Palpations: Abdomen is soft.     Tenderness: There is no abdominal tenderness. There is no rebound.  Musculoskeletal: Normal range of motion.        General: No tenderness.     Right lower leg: No edema.     Left lower leg: No edema.  Skin:  Findings: No erythema or rash.  Neurological:     Mental Status: He is alert and oriented to person, place, and time.     Cranial Nerves: No cranial nerve deficit.  Psychiatric:        Mood and Affect: Mood normal.    LABORATORY PANEL:  Male CBC Recent Labs  Lab 08/28/18 0450  WBC 6.9  HGB 11.8*  HCT 36.1*  PLT 95*   ------------------------------------------------------------------------------------------------------------------ Chemistries  Recent Labs  Lab 08/27/18 1125  08/28/18 0450 08/29/18 0406  NA 132* 134* 136  K 3.2* 3.1* 3.4*  CL 98 105 104  CO2 20* 23 23  GLUCOSE 136* 105* 111*  BUN 17 15 15   CREATININE 1.27* 0.95 0.89  CALCIUM 8.5* 7.9* 7.7*  MG  --  1.8  --   AST 30  --   --   ALT 16  --   --   ALKPHOS 136*  --   --   BILITOT 4.0*  --   --    RADIOLOGY:  No results found. ASSESSMENT AND PLAN:   Sepsis and acute hypoxic respiratory failure  with hypoxia secondary to community-acquired pneumonia- -COVID negative -Flu negative. -CT chest showed a large right lower lobe airspace opacity consistent with pneumonia -Continue antibiotics -Follow-up blood cultures negative so far -Check procalcitonin 1.82 -DuoNebs PRN -Wean O2 as able  Mild lactic acidosis.  Improved.  Hypokalemia Continue potassium supplement.  Hyponatremia.  Improved with normal saline.  Hypokalemia.  Potassium supplement  AKI- likely due to sepsis.  Creatinine 1.27 (baseline 0.9-1.0) Improved with IV fluid support.  Paroxysmal atrial fibrillation- in sinus tachycardia here.  Rate is controlled. -Continue home metoprolol and Xarelto -Lopressor IV as needed  Ground glass opacity in the medial right upper lobe- seen on CT chest -Needs f/u CT chest w/o contrast in 3 months to confirm persistence  Thoracic ascending aortic aneurysm- stable at 4.3 cm -Needs annual imaging follow-up by CTA or MRI  Hypertension -Continue home Norvasc and metoprolol  Antiphospholipid syndrome- stable -Continue home Plaquenil  Hyperlipidemia- stable -Continue home Lipitor  OSA- stable -CPAP qhs  Chronic thrombocytopenia- platelet at baseline  All the records are reviewed and case discussed with Care Management/Social Worker. Management plans discussed with the patient, his daughter and they are in agreement.  CODE STATUS: Full Code  TOTAL TIME TAKING CARE OF THIS PATIENT: 25 minutes.   More than 50% of the time was spent in counseling/coordination of  care: YES  POSSIBLE D/C IN 2 DAYS, DEPENDING ON CLINICAL CONDITION.   Demetrios Loll M.D on 08/29/2018 at 2:42 PM  Between 7am to 6pm - Pager - [phone]  After 6pm go to [email or web address] - [facility]

## 2018-08-30 MED ORDER — ACETAMINOPHEN-CODEINE #3 300-30 MG PO TABS: 1.0000 | ORAL_TABLET | Freq: Four times a day (QID) | ORAL | 0 refills | Status: AC | PRN

## 2018-08-30 MED ORDER — LEVOFLOXACIN 750 MG PO TABS: 750.0000 mg | ORAL_TABLET | Freq: Every day | ORAL | 0 refills | Status: AC

## 2018-08-30 MED ORDER — GUAIFENESIN-DM 100-10 MG/5ML PO SYRP: 5.0000 mL | ORAL_SOLUTION | ORAL | 0 refills | Status: DC | PRN

## 2018-08-30 NOTE — Discharge Summary (Signed)
[facility] [place] NAME: Martin Adkins    MR#:  [id number]  [place] OF BIRTH:  Jun 20, 1945  DATE OF ADMISSION:  08/27/2018   ADMITTING PHYSICIAN: Sela Hua, MD  DATE OF DISCHARGE: 08/30/2018 PRIMARY CARE PHYSICIAN: [name], MD   ADMISSION DIAGNOSIS:  Sepsis, due to unspecified organism, unspecified whether acute organ dysfunction present [facility]) [A41.9] DISCHARGE DIAGNOSIS:  Active Problems:   Sepsis ([place])  SECONDARY DIAGNOSIS:   Past Medical History:  Diagnosis Date   AAA (abdominal aortic aneurysm) without rupture ([place]) 04/05/2014   AAA (abdominal aortic aneurysm) without rupture ([place]) 04/05/2014   Abdominal aortic aneurysm without rupture ([facility])    Acute non-ST elevation myocardial infarction (NSTEMI) ([place]) 08/15/2014   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/28/2014   APS (antiphospholipid syndrome) ([place])    Arteriosclerosis of coronary artery 04/05/2014   Overview:  Sp cabg with lima to lad svg to d1 om1 rca 2004 with occluded svg to rca and d1 and pci stetn of om1 graft 2015    Arthritis    B12 deficiency 03/21/2017   Barrett's esophagus    Benign essential HTN 05/02/2014   BPH (benign prostatic hyperplasia)    CAD (coronary artery disease)    CHF (congestive heart failure) (HCC)    Chronic systolic heart failure ([place]) 04/19/2014   Overview:  With segmental LV systolic dysfunction ejection fraction of 35%    CLL (chronic lymphocytic leukemia) (HCC)    CLL (chronic lymphocytic leukemia) ([place]) 05/24/2017   DVT (deep venous thrombosis) (HCC)    Dysrhythmia    ATRIAL FIB.   GERD (gastroesophageal reflux disease)    History of hiatal hernia    History of shingles 2013   Hyperlipemia    Hyperplastic polyps of stomach    Hypertension    Myocardial infarction ([place]) 07/30/2014   NSTEMI (non-ST elevated myocardial infarction) (H[facility])    OSA (obstructive sleep apnea)    Osteoarthritis     Pre-diabetes    PVD (peripheral vascular disease) (HCC)    RA (rheumatoid arthritis) (HCC)    SOB (shortness of breath)    HOSPITAL COURSE:  Sepsisand acute hypoxic respiratory failure with hypoxia secondary to community-acquired pneumonia- -COVID negative -Flu negative. -CT chest showed a large right lower lobe airspace opacity consistent with pneumonia He has been treated with Zithromax and Rocephin IV.  Change to p.o. Levaquin for 7 more days.  Robitussin as needed. -Follow-up blood cultures negative so far -Check procalcitonin 1.82 -DuoNebs PRN -Weaned off O2  Mild lactic acidosis.  Improved.  Hypokalemia Continue potassium supplement.  Hyponatremia.  Improved with normal saline.  Hypokalemia.  Potassium supplement given.  Follow-up level as outpatient.  AKI- likelydue to sepsis. Creatinine 1.27 (baseline 0.9-1.0) Improved with IV fluid support.  Paroxysmal atrial fibrillation-in sinus tachycardia here.  Rate is controlled. -Continue home metoprolol and Xarelto -Lopressor IV as needed  Ground glass opacity in the medial right upper lobe- seen on CT chest -Needs f/u CT chest w/o contrast in 3 months to confirm persistence  Thoracic ascending aortic aneurysm-stable at 4.3 cm -Needs annual imaging follow-up by CTA or MRI  Hypertension -Continue home Norvasc and metoprolol  Antiphospholipid syndrome-stable -Continue home Plaquenil  Hyperlipidemia-stable -Continue home Lipitor  OSA-stable -CPAPqhs  Chronicthrombocytopenia-platelet at baseline DISCHARGE CONDITIONS:  Stable, discharge to home today. CONSULTS OBTAINED:   DRUG ALLERGIES:   Allergies  Allergen Reactions   Ace Inhibitors Other (See Comments)    Other reaction(s): Cough   Pravastatin  Other (See Comments)    Other reaction(s): Muscle Pain   Rosuvastatin Other (See Comments)    Other reaction(s): Other (See Comments) GI bleed   Simvastatin Other (See Comments)     Other reaction(s): Muscle Pain   Amoxicillin Rash   Niacin Rash   Penicillins Rash    Has patient had a PCN reaction causing immediate rash, facial/tongue/throat swelling, SOB or lightheadedness with hypotension: Yes Has patient had a PCN reaction causing severe rash involving mucus membranes or skin necrosis: Unknown Has patient had a PCN reaction that required hospitalization: Unknown Has patient had a PCN reaction occurring within the last 10 years: No If all of the above answers are "NO", then may proceed with Cephalosporin use.   DISCHARGE MEDICATIONS:   Allergies as of 08/30/2018      Reactions   Ace Inhibitors Other (See Comments)   Other reaction(s): Cough   Pravastatin Other (See Comments)   Other reaction(s): Muscle Pain   Rosuvastatin Other (See Comments)   Other reaction(s): Other (See Comments) GI bleed   Simvastatin Other (See Comments)   Other reaction(s): Muscle Pain   Amoxicillin Rash   Niacin Rash   Penicillins Rash   Has patient had a PCN reaction causing immediate rash, facial/tongue/throat swelling, SOB or lightheadedness with hypotension: Yes Has patient had a PCN reaction causing severe rash involving mucus membranes or skin necrosis: Unknown Has patient had a PCN reaction that required hospitalization: Unknown Has patient had a PCN reaction occurring within the last 10 years: No If all of the above answers are "NO", then may proceed with Cephalosporin use.      Medication List    STOP taking these medications   doxycycline 100 MG capsule Commonly known as: VIBRAMYCIN     TAKE these medications   acetaminophen-codeine 300-30 MG tablet Commonly known as: TYLENOL #3 Take 1 tablet by mouth every 6 (six) hours as needed for up to 4 days for moderate pain or severe pain.   albuterol 108 (90 Base) MCG/ACT inhaler Commonly known as: VENTOLIN HFA Inhale 2 puffs into the lungs every 6 (six) hours as needed for wheezing or shortness of breath.     amLODipine 10 MG tablet Commonly known as: NORVASC Take 10 mg by mouth daily.   atorvastatin 80 MG tablet Commonly known as: LIPITOR Take 80 mg by mouth daily.   dorzolamide 2 % ophthalmic solution Commonly known as: TRUSOPT INSTILL ONE DROP IN EACH EYE TWICE DAILY   fluticasone 50 MCG/ACT nasal spray Commonly known as: FLONASE Place 2 sprays into both nostrils daily.   gabapentin 300 MG capsule Commonly known as: NEURONTIN Take 1 capsule (300 mg total) by mouth 3 (three) times daily. What changed: when to take this   guaiFENesin-dextromethorphan 100-10 MG/5ML syrup Commonly known as: ROBITUSSIN DM Take 5 mLs by mouth every 4 (four) hours as needed for cough.   hydroxychloroquine 200 MG tablet Commonly known as: PLAQUENIL Take 200 mg by mouth every other day.   Ipratropium-Albuterol 20-100 MCG/ACT Aers respimat Commonly known as: COMBIVENT Inhale 1 puff into the lungs every 6 (six) hours.   isosorbide mononitrate 60 MG 24 hr tablet Commonly known as: IMDUR Take 1 tablet (60 mg total) by mouth daily.   latanoprost 0.005 % ophthalmic solution Commonly known as: XALATAN INSTILL ONE DROP IN EACH EYE AT BEDTIME   levofloxacin 750 MG tablet Commonly known as: Levaquin Take 1 tablet (750 mg total) by mouth daily for 7 days.   metoprolol succinate  100 MG 24 hr tablet Commonly known as: TOPROL-XL Take 100 mg by mouth daily.   montelukast 10 MG tablet Commonly known as: Singulair Take 1 tablet (10 mg total) by mouth as needed. Take before Gazyva infusion.   pantoprazole 40 MG tablet Commonly known as: PROTONIX Take 40 mg by mouth daily.   potassium chloride SA 20 MEQ tablet Commonly known as: K-DUR Take 20 mEq by mouth daily.   rivaroxaban 20 MG Tabs tablet Commonly known as: Xarelto Take 1 tablet (20 mg total) by mouth daily with supper.   vitamin B-12 1000 MCG tablet Commonly known as: CYANOCOBALAMIN TAKE ONE TABLET BY MOUTH ONCE DAILY         DISCHARGE INSTRUCTIONS:  See AVS. If you experience worsening of your admission symptoms, develop shortness of breath, life threatening emergency, suicidal or homicidal thoughts you must seek medical attention immediately by calling 911 or calling your MD immediately  if symptoms less severe.  You Must read complete instructions/literature along with all the possible adverse reactions/side effects for all the Medicines you take and that have been prescribed to you. Take any new Medicines after you have completely understood and accpet all the possible adverse reactions/side effects.   Please note  You were cared for by a hospitalist during your hospital stay. If you have any questions about your discharge medications or the care you received while you were in the hospital after you are discharged, you can call the unit and asked to speak with the hospitalist on call if the hospitalist that took care of you is not available. Once you are discharged, your primary care physician will handle any further medical issues. Please note that NO REFILLS for any discharge medications will be authorized once you are discharged, as it is imperative that you return to your primary care physician (or establish a relationship with a primary care physician if you do not have one) for your aftercare needs so that they can reassess your need for medications and monitor your lab values.    On the day of Discharge:  VITAL SIGNS:  Blood pressure 120/83, pulse 91, temperature 99.2 F (37.3 C), resp. rate 18, height 6' (1.829 m), weight 88.6 kg, SpO2 96 %. PHYSICAL EXAMINATION:  GENERAL:  73 y.o.-year-old patient lying in the bed with no acute distress.  EYES: Pupils equal, round, reactive to light and accommodation. No scleral icterus. Extraocular muscles intact.  HEENT: Head atraumatic, normocephalic. Oropharynx and nasopharynx clear.  NECK:  Supple, no jugular venous distention. No thyroid enlargement, no  tenderness.  LUNGS: Normal breath sounds bilaterally, no wheezing, rales,rhonchi or crepitation. No use of accessory muscles of respiration.  CARDIOVASCULAR: S1, S2 normal. No murmurs, rubs, or gallops.  ABDOMEN: Soft, non-tender, non-distended. Bowel sounds present. No organomegaly or mass.  EXTREMITIES: No pedal edema, cyanosis, or clubbing.  NEUROLOGIC: Cranial nerves II through XII are intact. Muscle strength 5/5 in all extremities. Sensation intact. Gait not checked.  PSYCHIATRIC: The patient is alert and oriented x 3.  SKIN: No obvious rash, lesion, or ulcer.  DATA REVIEW:   CBC Recent Labs  Lab 08/28/18 0450  WBC 6.9  HGB 11.8*  HCT 36.1*  PLT 95*    Chemistries  Recent Labs  Lab 08/27/18 1125 08/28/18 0450 08/29/18 0406  NA 132* 134* 136  K 3.2* 3.1* 3.4*  CL 98 105 104  CO2 20* 23 23  GLUCOSE 136* 105* 111*  BUN 17 15 15   CREATININE 1.27* 0.95 0.89  CALCIUM  8.5* 7.9* 7.7*  MG  --  1.8  --   AST 30  --   --   ALT 16  --   --   ALKPHOS 136*  --   --   BILITOT 4.0*  --   --      Microbiology Results  Results for orders placed or performed during the hospital encounter of 08/27/18  Culture, blood (Routine x 2)     Status: None (Preliminary result)   Collection Time: 08/27/18 11:26 AM   Specimen: BLOOD  Result Value Ref Range Status   Specimen Description BLOOD BLOOD RIGHT ARM  Final   Special Requests   Final    BOTTLES DRAWN AEROBIC AND ANAEROBIC Blood Culture adequate volume   Culture   Final    NO GROWTH 3 DAYS Performed at [facility], [address], [place], [place] [address]    Report Status PENDING  Incomplete  Culture, blood (Routine x 2)     Status: None (Preliminary result)   Collection Time: 08/27/18 11:26 AM   Specimen: BLOOD  Result Value Ref Range Status   Specimen Description BLOOD BLOOD LEFT FOREARM  Final   Special Requests   Final    BOTTLES DRAWN AEROBIC AND ANAEROBIC Blood Culture adequate volume   Culture   Final     NO GROWTH 3 DAYS Performed at [facility], [address], [place],  [address]    Report Status PENDING  Incomplete  SARS Coronavirus 2 [facility] order, Performed in [place] hospital lab) Nasopharyngeal Nasopharyngeal Swab     Status: None   Collection Time: 08/27/18 11:27 AM   Specimen: Nasopharyngeal Swab  Result Value Ref Range Status   SARS Coronavirus 2 NEGATIVE NEGATIVE Final    Comment: (NOTE) If result is NEGATIVE SARS-CoV-2 target nucleic acids are NOT DETECTED. The SARS-CoV-2 RNA is generally detectable in upper and lower  respiratory specimens during the acute phase of infection. The lowest  concentration of SARS-CoV-2 viral copies this assay can detect is 250  copies / mL. A negative result does not preclude SARS-CoV-2 infection  and should not be used as the sole basis for treatment or other  patient management decisions.  A negative result may occur with  improper specimen collection / handling, submission of specimen other  than nasopharyngeal swab, presence of viral mutation(s) within the  areas targeted by this assay, and inadequate number of viral copies  (<250 copies / mL). A negative result must be combined with clinical  observations, patient history, and epidemiological information. If result is POSITIVE SARS-CoV-2 target nucleic acids are DETECTED. The SARS-CoV-2 RNA is generally detectable in upper and lower  respiratory specimens dur ing the acute phase of infection.  Positive  results are indicative of active infection with SARS-CoV-2.  Clinical  correlation with patient history and other diagnostic information is  necessary to determine patient infection status.  Positive results do  not rule out bacterial infection or co-infection with other viruses. If result is PRESUMPTIVE POSTIVE SARS-CoV-2 nucleic acids MAY BE PRESENT.   A presumptive positive result was obtained on the submitted specimen  and confirmed on repeat testing.  While  2019 novel coronavirus  (SARS-CoV-2) nucleic acids may be present in the submitted sample  additional confirmatory testing may be necessary for epidemiological  and / or clinical management purposes  to differentiate between  SARS-CoV-2 and other Sarbecovirus currently known to infect humans.  If clinically indicated additional testing with an alternate test  methodology [phone]) is advised. The SARS-CoV-2 RNA is generally  detectable in upper and lower respiratory sp ecimens during the acute  phase of infection. The expected result is Negative. Fact Sheet for Patients:  StrictlyIdeas.no Fact Sheet for Healthcare Providers: BankingDealers.co.za This test is not yet approved or cleared by the [place] FDA and has been authorized for detection and/or diagnosis of SARS-CoV-2 by FDA under an Emergency Use Authorization (EUA).  This EUA will remain in effect (meaning this test can be used) for the duration of the COVID-19 declaration under Section 564(b)(1) of the Act, 21 U.S.C. section 360bbb-3(b)(1), unless the authorization is terminated or revoked sooner. Performed at [facility], [address], [place], [place] [address]     RADIOLOGY:  No results found.   Management plans discussed with the patient, family and they are in agreement.  CODE STATUS: Full Code   TOTAL TIME TAKING CARE OF THIS PATIENT: 33 minutes.    Demetrios Loll M.D on 08/30/2018 at 9:59 AM  Between 7am to 6pm - Pager - [phone]  After 6pm go to [email or web address] - Proofreader  [facility]  Hospitalists  Office  [phone]  CC: Primary care physician; [name], MD   Note: This dictation was prepared with Dragon dictation along with smaller phrase technology. Any transcriptional errors that result from this process are unintentional.

## 2018-08-30 NOTE — Plan of Care (Signed)
  Problem: Education: °Goal: Knowledge of General Education information will improve °Description: Including pain rating scale, medication(s)/side effects and non-pharmacologic comfort measures °Outcome: Adequate for Discharge °  °Problem: Activity: °Goal: Risk for activity intolerance will decrease °Outcome: Adequate for Discharge °  °Problem: Pain Managment: °Goal: General experience of comfort will improve °Outcome: Adequate for Discharge °  °Problem: Safety: °Goal: Ability to remain free from injury will improve °Outcome: Adequate for Discharge °  °Problem: Skin Integrity: °Goal: Risk for impaired skin integrity will decrease °Outcome: Adequate for Discharge °  °

## 2018-09-01 LAB — CULTURE, BLOOD (ROUTINE X 2)
Culture: NO GROWTH
Culture: NO GROWTH
Special Requests: ADEQUATE
Special Requests: ADEQUATE

## 2018-09-02 ENCOUNTER — Encounter: Payer: Self-pay | Admitting: *Deleted

## 2018-09-02 DIAGNOSIS — I214 Non-ST elevation (NSTEMI) myocardial infarction: Secondary | ICD-10-CM

## 2018-09-08 ENCOUNTER — Other Ambulatory Visit: Payer: Self-pay | Admitting: Internal Medicine

## 2018-09-08 DIAGNOSIS — J849 Interstitial pulmonary disease, unspecified: Secondary | ICD-10-CM

## 2018-09-11 ENCOUNTER — Encounter: Payer: Medicare PPO | Attending: Cardiology

## 2018-09-11 DIAGNOSIS — I214 Non-ST elevation (NSTEMI) myocardial infarction: Secondary | ICD-10-CM | POA: Insufficient documentation

## 2018-09-15 NOTE — Progress Notes (Signed)
unable to meet with pt due to inconsistent attendence

## 2018-09-16 ENCOUNTER — Encounter: Payer: Self-pay | Admitting: *Deleted

## 2018-09-16 ENCOUNTER — Other Ambulatory Visit: Payer: Self-pay | Admitting: Rheumatology

## 2018-09-16 ENCOUNTER — Other Ambulatory Visit (HOSPITAL_COMMUNITY): Payer: Self-pay | Admitting: Rheumatology

## 2018-09-16 ENCOUNTER — Other Ambulatory Visit (HOSPITAL_COMMUNITY): Payer: Self-pay | Admitting: Diagnostic Radiology

## 2018-09-16 DIAGNOSIS — I214 Non-ST elevation (NSTEMI) myocardial infarction: Secondary | ICD-10-CM

## 2018-09-16 DIAGNOSIS — M5416 Radiculopathy, lumbar region: Secondary | ICD-10-CM

## 2018-09-16 NOTE — Progress Notes (Signed)
Cardiac Individual Treatment Plan  Patient Details  Name: Martin Adkins MRN: [id number] Date of Birth: August 03, 1945 Referring Provider:     Cardiac Rehab from 01/29/2018 in [facility] Cardiac and Pulmonary Rehab  Referring Provider  [name]      Initial Encounter Date:    Cardiac Rehab from 01/29/2018 in [facility] Cardiac and Pulmonary Rehab  Date  01/29/18      Visit Diagnosis: NSTEMI (non-ST elevated myocardial infarction) [facility])  Patient's Home Medications on Admission:  Current Outpatient Medications:  .  albuterol (PROVENTIL HFA;VENTOLIN HFA) 108 (90 Base) MCG/ACT inhaler, Inhale 2 puffs into the lungs every 6 (six) hours as needed for wheezing or shortness of breath., Disp: 1 Inhaler, Rfl: 0 .  amLODipine (NORVASC) 10 MG tablet, Take 10 mg by mouth daily. , Disp: , Rfl:  .  atorvastatin (LIPITOR) 80 MG tablet, Take 80 mg by mouth daily. , Disp: , Rfl: 11 .  dorzolamide (TRUSOPT) 2 % ophthalmic solution, INSTILL ONE DROP IN [facility] EYE TWICE DAILY, Disp: , Rfl:  .  fluticasone (FLONASE) 50 MCG/ACT nasal spray, Place 2 sprays into both nostrils daily. , Disp: , Rfl:  .  gabapentin (NEURONTIN) 300 MG capsule, Take 1 capsule (300 mg total) by mouth 3 (three) times daily. (Patient taking differently: Take 300 mg by mouth 2 (two) times daily. ), Disp: 90 capsule, Rfl: 0 .  guaiFENesin-dextromethorphan (ROBITUSSIN DM) 100-10 MG/5ML syrup, Take 5 mLs by mouth every 4 (four) hours as needed for cough., Disp: 236 mL, Rfl: 0 .  hydroxychloroquine (PLAQUENIL) 200 MG tablet, Take 200 mg by mouth every other day. , Disp: , Rfl:  .  Ipratropium-Albuterol (COMBIVENT) 20-100 MCG/ACT AERS respimat, Inhale 1 puff into the lungs every 6 (six) hours., Disp: 1 Inhaler, Rfl: 11 .  isosorbide mononitrate (IMDUR) 60 MG 24 hr tablet, Take 1 tablet (60 mg total) by mouth daily., Disp: 30 tablet, Rfl: 0 .  latanoprost (XALATAN) 0.005 % ophthalmic solution, INSTILL ONE DROP IN [facility] EYE AT BEDTIME, Disp: , Rfl:  .   metoprolol succinate (TOPROL-XL) 100 MG 24 hr tablet, Take 100 mg by mouth daily. , Disp: , Rfl:  .  montelukast (SINGULAIR) 10 MG tablet, Take 1 tablet (10 mg total) by mouth as needed. Take before Gazyva infusion., Disp: 20 tablet, Rfl: 0 .  pantoprazole (PROTONIX) 40 MG tablet, Take 40 mg by mouth daily. , Disp: , Rfl:  .  potassium chloride SA (K-DUR,KLOR-CON) 20 MEQ tablet, Take 20 mEq by mouth daily. , Disp: , Rfl:  .  rivaroxaban (XARELTO) 20 MG TABS tablet, Take 1 tablet (20 mg total) by mouth daily with supper., Disp: 30 tablet, Rfl: 3 .  vitamin B-12 (CYANOCOBALAMIN) 1000 MCG tablet, TAKE ONE TABLET BY MOUTH ONCE DAILY (Patient not taking: Reported on 08/27/2018), Disp: 90 tablet, Rfl: 0  Past Medical History: Past Medical History:  Diagnosis Date  . AAA (abdominal aortic aneurysm) without rupture ([place]) 04/05/2014  . AAA (abdominal aortic aneurysm) without rupture ([place]) 04/05/2014  . Abdominal aortic aneurysm without rupture ([place])   . Acute non-ST elevation myocardial infarction (NSTEMI) ([place]) 08/15/2014  . 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/28/2014  . APS (antiphospholipid syndrome) ([place])   . Arteriosclerosis of coronary artery 04/05/2014   Overview:  Sp cabg with lima to lad svg to d1 om1 rca 2004 with occluded svg to rca and d1 and pci stetn of om1 graft 2015   . Arthritis   . B12 deficiency 03/21/2017  . Barrett's esophagus   .  Benign essential HTN 05/02/2014  . BPH (benign prostatic hyperplasia)   . CAD (coronary artery disease)   . CHF (congestive heart failure) ([place])   . Chronic systolic heart failure ([place]) 04/19/2014   Overview:  With segmental LV systolic dysfunction ejection fraction of 35%   . CLL (chronic lymphocytic leukemia) ([place])   . CLL (chronic lymphocytic leukemia) ([place]) 05/24/2017  . DVT (deep venous thrombosis) ([place])   . Dysrhythmia    ATRIAL FIB.  Marland Kitchen GERD (gastroesophageal reflux disease)   . History of hiatal hernia   . History of shingles 2013  . Hyperlipemia    . Hyperplastic polyps of stomach   . Hypertension   . Myocardial infarction ([place]) 07/30/2014  . NSTEMI (non-ST elevated myocardial infarction) ([place])   . OSA (obstructive sleep apnea)   . Osteoarthritis   . Pre-diabetes   . PVD (peripheral vascular disease) ([place])   . RA (rheumatoid arthritis) ([place])   . SOB (shortness of breath)     Tobacco Use: Social History   Tobacco Use  Smoking Status Former Smoker  . Packs/day: 0.50  . Years: 20.00  . Pack years: 10.00  . Types: Cigarettes  Smokeless Tobacco Never Used  Tobacco Comment   Quit     Labs: Recent Review Flowsheet Data    Labs for ITP Cardiac and Pulmonary Rehab Latest Ref Rng & Units 05/03/2013 01/19/2018   Cholestrol 0 - 200 mg/dL 196 116   LDLCALC 0 - 99 mg/dL 121(H) 49   HDL >40 mg/dL 42 51   Trlycerides <150 mg/dL 164 81       Exercise Target Goals: Exercise Program Goal: Individual exercise prescription set using results from initial 6 min walk test and THRR while considering  patient's activity barriers and safety.   Exercise Prescription Goal: Initial exercise prescription builds to 30-45 minutes a day of aerobic activity, 2-3 days per week.  Home exercise guidelines will be given to patient during program as part of exercise prescription that the participant will acknowledge.  Activity Barriers & Risk Stratification:   6 Minute Walk:   Oxygen Initial Assessment:   Oxygen Re-Evaluation:   Oxygen Discharge (Final Oxygen Re-Evaluation):   Initial Exercise Prescription:   Perform Capillary Blood Glucose checks as needed.  Exercise Prescription Changes: Exercise Prescription Changes    Row Name 03/25/18 0900 07/28/18 0900 08/10/18 1100 08/27/18 1400       Response to Exercise   Blood Pressure (Admit)  130/80  132/70  124/64  118/64    Blood Pressure (Exercise)  128/80  140/82  134/60  144/60    Blood Pressure (Exit)  124/78  126/56  116/64  118/68    Heart Rate (Admit)  82 bpm  96 bpm  89 bpm   97 bpm    Heart Rate (Exercise)  115 bpm  123 bpm  118 bpm  108 bpm    Heart Rate (Exit)  81 bpm  95 bpm  94 bpm  83 bpm    Rating of Perceived Exertion (Exercise)  '13  12  13  13    ' Symptoms  none  none  none  none    Duration  Continue with 30 min of aerobic exercise without signs/symptoms of physical distress.  Continue with 30 min of aerobic exercise without signs/symptoms of physical distress.  Continue with 30 min of aerobic exercise without signs/symptoms of physical distress.  Continue with 30 min of aerobic exercise without signs/symptoms of physical distress.    Intensity  THRR unchanged  THRR unchanged  THRR unchanged  THRR unchanged      Progression   Progression  Continue to progress workloads to maintain intensity without signs/symptoms of physical distress.  Continue to progress workloads to maintain intensity without signs/symptoms of physical distress.  Continue to progress workloads to maintain intensity without signs/symptoms of physical distress.  Continue to progress workloads to maintain intensity without signs/symptoms of physical distress.    Average METs  2.99  3.32  2.3  2.32      Resistance Training   Training Prescription  Yes  Yes  Yes  Yes    Weight  4 lbs  4 lbs  4 lbs  4 lbs    Reps  10-15  10-15  10-15  10-15      Interval Training   Interval Training  No  No  No  No      Treadmill   MPH  2.2  2.2  2.3  2.3    Grade  '1  1  1  1    ' Minutes  '15  15  15  15    ' METs  2.99  2.99  3.08  3.08      Recumbant Bike   Level  -  2  -  -    Watts  -  60  -  -    Minutes  -  15  -  -    METs  -  3.99  -  -      NuStep   Level  4  4  -  4    Minutes  15  15  -  15    METs  3  -  -  2.3      Arm Ergometer   Level  -  -  2  2    Minutes  -  -  15  15    METs  -  -  2.1  2      T5 Nustep   Level  -  -  4  -    Minutes  -  -  15  -    METs  -  -  2  -      Biostep-RELP   Level  -  -  2  4    Minutes  -  -  15  15    METs  -  -  2  2      Home  Exercise Plan   Plans to continue exercise at  [place]    Frequency  Add 2 additional days to program exercise sessions.  Add 2 additional days to program exercise sessions.  Add 2 additional days to program exercise sessions.  Add 2 additional days to program exercise sessions.    Initial Home Exercises Provided  02/12/18  02/12/18  02/12/18  02/12/18       Exercise Comments:   Exercise Goals and Review:   Exercise Goals Re-Evaluation : Exercise Goals Re-Evaluation    Row Name 04/01/18 1225 07/28/18 0858 08/03/18 0837 08/10/18 1144 08/27/18 1445     Exercise Goal Re-Evaluation   Exercise Goals Review  Increase Physical Activity;Increase Strength and Stamina;Understanding of Exercise Prescription  Increase Physical Activity;Increase Strength and Stamina;Understanding of Exercise Prescription  Increase Physical Activity;Increase Strength and Stamina;Understanding of Exercise Prescription  Increase Physical Activity;Increase Strength and Stamina;Understanding of Exercise  Prescription  Increase Physical Activity;Increase Strength and Stamina;Understanding of Exercise Prescription   Comments  Spoke with [name] on the phone today.  [name] has not been doing as much as he should. He is doing what he can at home. He misses coming into to class.   [name] is off to a good start back in rehab.  He was able to get back to his normal workloads and is already up to 3 METs on the BioStep.  We will continue to monitor his progress.  [name] is doing well with exericse.  He is doing some walking, but not enought due to the heat.  He is starting to feel stronger, he wished he had more energy.  He is now going to be coming three days a week and hopefully the extra day will help with the energy boost.  [name] continues to do well in rehab. He is now up to 2.3 mph on the treadmill.  We will continue to monitor his progress.  [name] has been doing well in rehab.   He is attending three days a week now to finish up.  He is on level 4 for the NuStep and BioStep.  We will continue to monitor his progress.   Expected Outcomes  Short: Walk more at home.  Long: Continue to maintain strength and stamina.   Short: Start to increase workloads.  Long: Continue to increase strength  Short: Continue to exercise more in general.  Long: Continue to increase stamina.  Short: Continue to increase workloads.  Long: Continue to improve stamina.  Short: Increase arm crank.  Long: Continue to exercise on off days.   [place] Name 09/02/18 1329             Exercise Goal Re-Evaluation   Comments  Out since last review          Discharge Exercise Prescription (Final Exercise Prescription Changes): Exercise Prescription Changes - 08/27/18 1400      Response to Exercise   Blood Pressure (Admit)  118/64    Blood Pressure (Exercise)  144/60    Blood Pressure (Exit)  118/68    Heart Rate (Admit)  97 bpm    Heart Rate (Exercise)  108 bpm    Heart Rate (Exit)  83 bpm    Rating of Perceived Exertion (Exercise)  13    Symptoms  none    Duration  Continue with 30 min of aerobic exercise without signs/symptoms of physical distress.    Intensity  THRR unchanged      Progression   Progression  Continue to progress workloads to maintain intensity without signs/symptoms of physical distress.    Average METs  2.32      Resistance Training   Training Prescription  Yes    Weight  4 lbs    Reps  10-15      Interval Training   Interval Training  No      Treadmill   MPH  2.3    Grade  1    Minutes  15    METs  3.08      NuStep   Level  4    Minutes  15    METs  2.3      Arm Ergometer   Level  2    Minutes  15    METs  2      Biostep-RELP   Level  4    Minutes  15    METs  2  Home Exercise Plan   Plans to continue exercise at  [facility]    Frequency  Add 2 additional days to program exercise sessions.    Initial Home Exercises Provided  02/12/18        Nutrition:  Target Goals: Understanding of nutrition guidelines, daily intake of sodium <1594m, cholesterol <2014m calories 30% from fat and 7% or less from saturated fats, daily to have 5 or more servings of fruits and vegetables.  Biometrics:    Nutrition Therapy Plan and Nutrition Goals: Nutrition Therapy & Goals - 03/30/18 1012      Nutrition Therapy   Diet  Heart Healthy Low Sodium Diet 1750-1950kcal (wst: 42 inches!; ref<40inches)(BMI: 29.46)    Protein (specify units)  75-80g    Fiber  30 grams    Whole Grain Foods  3 servings    Saturated Fats  12 max. grams    Fruits and Vegetables  5 servings/day    Sodium  1.5 grams      Personal Nutrition Goals   Nutrition Goal  ST: increase protein by 1/2-1 serving/ day LT: feel better and gain more energy    Comments  Vegetables 3 servings per day, not making much progress with increasing fruit intake; told him that increasing either fruit or vegetable intake would be fine, if increasing his vegetable intake is easier he should do that. He notes that he has a little bit of oatmeal but not much, and will choose whole wheat bread, but doesn't eat many whole grains. Pt reports normally not eating much; boiled eggw/ slice of toast, chicken w/ green beans, and then a peanut butter cracker for dinner.       Intervention Plan   Intervention  Prescribe, educate and counsel regarding individualized specific dietary modifications aiming towards targeted core components such as weight, hypertension, lipid management, diabetes, heart failure and other comorbidities.    Expected Outcomes  Short Term Goal: Understand basic principles of dietary content, such as calories, fat, sodium, cholesterol and nutrients.;Short Term Goal: A plan has been developed with personal nutrition goals set during dietitian appointment.;Long Term Goal: Adherence to prescribed nutrition plan.       Nutrition Assessments:   Nutrition Goals  Re-Evaluation: Nutrition Goals Re-Evaluation    [place] 07/22/18 0857 08/14/18 0828           Goals   Current Weight  -  207 lb 8 oz (94.1 kg)      Nutrition Goal  ST: work on adding one F or different V serving a day LT: feel better and gain more energy  ST: work on adding one F or different V serving a day  LT: feel better and gain more energy      Comment  [name] continues to eat vegetables and a few fruits, talked about trying to incorporate a fruit that he enjoys, but we could add one more different vegetable serving if he cannot find one he enjoys to have in his diet. He sticks to his whole grains and lean meats, he has increased his protein and eating beans, fish, and lean poultry without the skin. Overall, he feels that he has a good grasp on his diet.  Pt reports eating a variety of vegetables besides leafy greens as he is on a blood thinner; pt used to be on warfarin, now on Xarelto which does not need consistent vitamin K intake like warfarin or coumadin (verified on website), told pt he can have his green leafy vegetables  to increase variety. Pt didn't want to make any new goals and thinks he is doing well with his diet. Pt reports his arthritis has been bothering him, told him if it gets too bad and interferes with his diet, that we could work together to make meals that would be less taxing on his wrists and hands.      Expected Outcome  Short: Try more fruits or different vegetable for variety  Long: Continue to follow healthy eating plan  Short: Try more fruits or different vegetable for variety  Long: Continue to follow healthy eating plan         Nutrition Goals Discharge (Final Nutrition Goals Re-Evaluation): Nutrition Goals Re-Evaluation - 08/14/18 0828      Goals   Current Weight  207 lb 8 oz (94.1 kg)    Nutrition Goal  ST: work on adding one F or different V serving a day  LT: feel better and gain more energy    Comment  Pt reports eating a variety of vegetables besides  leafy greens as he is on a blood thinner; pt used to be on warfarin, now on Xarelto which does not need consistent vitamin K intake like warfarin or coumadin (verified on website), told pt he can have his green leafy vegetables to increase variety. Pt didn't want to make any new goals and thinks he is doing well with his diet. Pt reports his arthritis has been bothering him, told him if it gets too bad and interferes with his diet, that we could work together to make meals that would be less taxing on his wrists and hands.    Expected Outcome  Short: Try more fruits or different vegetable for variety  Long: Continue to follow healthy eating plan       Psychosocial: Target Goals: Acknowledge presence or absence of significant depression and/or stress, maximize coping skills, provide positive support system. Participant is able to verbalize types and ability to use techniques and skills needed for reducing stress and depression.   Initial Review & Psychosocial Screening:   Quality of Life Scores:   Scores of 19 and below usually indicate a poorer quality of life in these areas.  A difference of  2-3 points is a clinically meaningful difference.  A difference of 2-3 points in the total score of the Quality of Life Index has been associated with significant improvement in overall quality of life, self-image, physical symptoms, and general health in studies assessing change in quality of life.  PHQ-9: Recent Review Flowsheet Data    Depression screen [facility] 2/9 01/29/2018 01/29/2018 11/16/2014 09/26/2014   Decreased Interest - 0 2 1   Down, Depressed, Hopeless 2 0 0 0   PHQ - 2 Score 2 0 2 1   Altered sleeping - 0 0 1   Tired, decreased energy 3 0 2 1   Change in appetite - 0 0 0   Feeling bad or failure about yourself  - 0 0 0   Trouble concentrating - 0 - 0   Moving slowly or fidgety/restless - 0 0 0   Suicidal thoughts - 0 0 0   PHQ-9 Score - 0 4 3   Difficult doing work/chores Somewhat difficult  - Somewhat difficult Somewhat difficult     Interpretation of Total Score  Total Score Depression Severity:  1-4 = Minimal depression, 5-9 = Mild depression, 10-14 = Moderate depression, 15-19 = Moderately severe depression, 20-27 = Severe depression   Psychosocial Evaluation and Intervention:  Psychosocial Re-Evaluation: Psychosocial Re-Evaluation    [place] Name 08/03/18 0840             Psychosocial Re-Evaluation   Current issues with  Current Stress Concerns       Comments  [name] is doing well.  He wishes his energy levels were higher, but he is getting better.  He continues to have some hip and back pain with his left leg.  He is planning to go back to rehuematologist again for help.  He continues to sleep well and doing well overall.       Expected Outcomes  Short: Continue to seek treatment for hip and back.  Long: Continue to stay positive.       Interventions  Encouraged to attend Cardiac Rehabilitation for the exercise          Psychosocial Discharge (Final Psychosocial Re-Evaluation): Psychosocial Re-Evaluation - 08/03/18 0840      Psychosocial Re-Evaluation   Current issues with  Current Stress Concerns    Comments  [name] is doing well.  He wishes his energy levels were higher, but he is getting better.  He continues to have some hip and back pain with his left leg.  He is planning to go back to rehuematologist again for help.  He continues to sleep well and doing well overall.    Expected Outcomes  Short: Continue to seek treatment for hip and back.  Long: Continue to stay positive.    Interventions  Encouraged to attend Cardiac Rehabilitation for the exercise       Vocational Rehabilitation: Provide vocational rehab assistance to qualifying candidates.   Vocational Rehab Evaluation & Intervention:   Education: Education Goals: Education classes will be provided on a variety of topics geared toward better understanding of heart health and risk factor modification.  Participant will state understanding/return demonstration of topics presented as noted by education test scores.  Learning Barriers/Preferences:   Education Topics:  AED/CPR: - Group verbal and written instruction with the use of models to demonstrate the basic use of the AED with the basic ABC's of resuscitation.   General Nutrition Guidelines/Fats and Fiber: -Group instruction provided by verbal, written material, models and posters to present the general guidelines for heart healthy nutrition. Gives an explanation and review of dietary fats and fiber.   Cardiac Rehab from 11/21/2014 in [facility] Cardiac and Pulmonary Rehab  Date  10/17/14  Educator  PI  Instruction Review Code (retired)  2- meets goals/outcomes      Controlling Sodium/Reading Food Labels: -Group verbal and written material supporting the discussion of sodium use in heart healthy nutrition. Review and explanation with models, verbal and written materials for utilization of the food label.   Cardiac Rehab from 03/19/2018 in [facility] Cardiac and Pulmonary Rehab  Date  03/19/18  Educator  [facility]  Instruction Review Code  5- Refused Teaching      Exercise Physiology & General Exercise Guidelines: - Group verbal and written instruction with models to review the exercise physiology of the cardiovascular system and associated critical values. Provides general exercise guidelines with specific guidelines to those with heart or lung disease.    Cardiac Rehab from 11/21/2014 in [facility] Cardiac and Pulmonary Rehab  Date  11/09/14  Educator  RM  Instruction Review Code (retired)  2- meets goals/outcomes      Aerobic Exercise & Resistance Training: - Gives group verbal and written instruction on the various components of exercise. Focuses on aerobic and resistive training programs and the benefits  of this training and how to safely progress through these programs..   Cardiac Rehab from 11/21/2014 in [facility] Cardiac and Pulmonary Rehab   Date  11/14/14  Educator  RM  Instruction Review Code (retired)  2- Statistician, Balance, Mind/Body Relaxation: Provides group verbal/written instruction on the benefits of flexibility and balance training, including mind/body exercise modes such as yoga, pilates and tai chi.  Demonstration and skill practice provided.   Cardiac Rehab from 03/19/2018 in [facility] Cardiac and Pulmonary Rehab  Date  02/03/18  Educator  AS  Instruction Review Code  1- Verbalizes Understanding      Stress and Anxiety: - Provides group verbal and written instruction about the health risks of elevated stress and causes of high stress.  Discuss the correlation between heart/lung disease and anxiety and treatment options. Review healthy ways to manage with stress and anxiety.   Cardiac Rehab from 03/19/2018 in [facility] Cardiac and Pulmonary Rehab  Date  02/24/18  Educator  [facility]  Instruction Review Code  5- Refused Teaching      Depression: - Provides group verbal and written instruction on the correlation between heart/lung disease and depressed mood, treatment options, and the stigmas associated with seeking treatment.   Cardiac Rehab from 03/19/2018 in [facility] Cardiac and Pulmonary Rehab  Date  03/10/18  Educator  [facility]  Instruction Review Code  5- Refused Teaching      Anatomy & Physiology of the Heart: - Group verbal and written instruction and models provide basic cardiac anatomy and physiology, with the coronary electrical and arterial systems. Review of Valvular disease and Heart Failure   Cardiac Rehab from 03/19/2018 in [facility] Cardiac and Pulmonary Rehab  Date  02/12/18  Educator  CE  Instruction Review Code  5- Refused Teaching      Cardiac Procedures: - Group verbal and written instruction to review commonly prescribed medications for heart disease. Reviews the medication, class of the drug, and side effects. Includes the steps to properly store meds and maintain the prescription  regimen. (beta blockers and nitrates)   Cardiac Rehab from 03/19/2018 in [facility] Cardiac and Pulmonary Rehab  Date  02/26/18  Educator  CE  Instruction Review Code  5- Refused Teaching      Cardiac Medications I: - Group verbal and written instruction to review commonly prescribed medications for heart disease. Reviews the medication, class of the drug, and side effects. Includes the steps to properly store meds and maintain the prescription regimen.   Cardiac Rehab from 03/19/2018 in [facility] [facility] Cardiac and Pulmonary Rehab  Date  02/17/18  Educator  SB  Instruction Review Code  1- Verbalizes Understanding      Cardiac Medications II: -Group verbal and written instruction to review commonly prescribed medications for heart disease. Reviews the medication, class of the drug, and side effects. (all other drug classes)   Cardiac Rehab from 03/19/2018 in [facility] Cardiac and Pulmonary Rehab  Date  02/05/18  Educator  CE  Instruction Review Code  1- Verbalizes Understanding       Go Sex-Intimacy & Heart Disease, Get SMART - Goal Setting: - Group verbal and written instruction through game format to discuss heart disease and the return to sexual intimacy. Provides group verbal and written material to discuss and apply goal setting through the application of the S.M.A.R.T. Method.   Cardiac Rehab from 03/19/2018 in [facility] Cardiac and Pulmonary Rehab  Date  02/26/18  Educator  CE  Instruction Review Code  5-  Refused Teaching      Other Matters of the Heart: - Provides group verbal, written materials and models to describe Stable Angina and Peripheral Artery. Includes description of the disease process and treatment options available to the cardiac patient.   Cardiac Rehab from 03/19/2018 in [facility] Cardiac and Pulmonary Rehab  Date  02/12/18  Educator  CE  Instruction Review Code  1- Verbalizes Understanding      Exercise & Equipment Safety: - Individual verbal instruction and demonstration of  equipment use and safety with use of the equipment.   Cardiac Rehab from 03/19/2018 in [facility] Cardiac and Pulmonary Rehab  Date  01/29/18  Educator  sb  Instruction Review Code  1- Verbalizes Understanding      Infection Prevention: - Provides verbal and written material to individual with discussion of infection control including proper hand washing and proper equipment cleaning during exercise session.   Cardiac Rehab from 03/19/2018 in [facility] Cardiac and Pulmonary Rehab  Date  01/29/18  Educator  SB1  Instruction Review Code  1- Verbalizes Understanding      Falls Prevention: - Provides verbal and written material to individual with discussion of falls prevention and safety.   Cardiac Rehab from 03/19/2018 in [facility] Cardiac and Pulmonary Rehab  Date  01/29/18  Educator  sb  Instruction Review Code  1- Verbalizes Understanding      Diabetes: - Individual verbal and written instruction to review signs/symptoms of diabetes, desired ranges of glucose level fasting, after meals and with exercise. Acknowledge that pre and post exercise glucose checks will be done for 3 sessions at entry of program.   Know Your Numbers and Risk Factors: -Group verbal and written instruction about important numbers in your health.  Discussion of what are risk factors and how they play a role in the disease process.  Review of Cholesterol, Blood Pressure, Diabetes, and BMI and the role they play in your overall health.   Cardiac Rehab from 03/19/2018 in [facility] Cardiac and Pulmonary Rehab  Date  02/05/18  Educator  CE  Instruction Review Code  1- Verbalizes Understanding      Sleep Hygiene: -Provides group verbal and written instruction about how sleep can affect your health.  Define sleep hygiene, discuss sleep cycles and impact of sleep habits. Review good sleep hygiene tips.    Cardiac Rehab from 03/19/2018 in [facility] Cardiac and Pulmonary Rehab  Date  02/10/18  Educator  CE  Instruction Review Code  5-  Refused Teaching      Other: -Provides group and verbal instruction on various topics (see comments)   Knowledge Questionnaire Score:   Core Components/Risk Factors/Patient Goals at Admission:   Core Components/Risk Factors/Patient Goals Review:  Goals and Risk Factor Review    Row Name 08/03/18 0841             Core Components/Risk Factors/Patient Goals Review   Personal Goals Review  Weight Management/Obesity;Lipids;Hypertension;Heart Failure;Tobacco Cessation       Review  [name] continues to keep his weight steady around 208 lbs.  He continues to have good pressure readings and checks them at home.  He has occasional shortness of breath but nothing excessive.  He continues to smoke 1-2 cig a day and is using patches to help as well.   He is not ready to set a quit date yet, but hopefully next month he will.       Expected Outcomes  Short: Continue to work on weight loss.  Long: Continue to work  towards quitting smoking.          Core Components/Risk Factors/Patient Goals at Discharge (Final Review):  Goals and Risk Factor Review - 08/03/18 0841      Core Components/Risk Factors/Patient Goals Review   Personal Goals Review  Weight Management/Obesity;Lipids;Hypertension;Heart Failure;Tobacco Cessation    Review  [name] continues to keep his weight steady around 208 lbs.  He continues to have good pressure readings and checks them at home.  He has occasional shortness of breath but nothing excessive.  He continues to smoke 1-2 cig a day and is using patches to help as well.   He is not ready to set a quit date yet, but hopefully next month he will.    Expected Outcomes  Short: Continue to work on weight loss.  Long: Continue to work towards quitting smoking.       ITP Comments: ITP Comments    Row Name 03/25/18 0932 04/01/18 1225 04/01/18 1348 07/22/18 1359 08/19/18 0624   ITP Comments  Our program is currently closed due to COVID-19.  We are communicating with patient via phone  calls and emails.   [name] called and requested to have his sessions sent to him to send into insurance.  I have printed these off to send out in the mail.    30 day review completed. Continue with ITP unless directed changes by Medical Director at review  30 day review cycle restarting  after being closed since March 16 because of  Covid 19 pandemic. Program opened to patients on July 6. Not all have returned. ITP updated and sent to Medical Director for review,changes as needed and signature  30 Day Review Completed today. Continue with ITP unless changed by Medical Director review.   [place] Name 09/02/18 1328 09/15/18 0902 09/16/18 0633       ITP Comments  [name] has been out since 8/17.  He was admitted on 8/20 for sepsis. He will need clearance to return.  unable to meet with pt due to inconsistent attendence  30 Day review. Continue with ITP unless directed changes per Medical Director review.  [name] has been out with illness        Comments:

## 2018-09-24 ENCOUNTER — Ambulatory Visit (INDEPENDENT_AMBULATORY_CARE_PROVIDER_SITE_OTHER): Payer: Medicare PPO

## 2018-09-24 ENCOUNTER — Other Ambulatory Visit: Payer: Self-pay

## 2018-09-24 ENCOUNTER — Ambulatory Visit (INDEPENDENT_AMBULATORY_CARE_PROVIDER_SITE_OTHER): Payer: Medicare PPO | Admitting: Nurse Practitioner

## 2018-09-24 ENCOUNTER — Encounter (INDEPENDENT_AMBULATORY_CARE_PROVIDER_SITE_OTHER): Payer: Self-pay | Admitting: Nurse Practitioner

## 2018-09-24 VITALS — BP 142/89 | HR 63 | Resp 14 | Ht 72.0 in | Wt 202.0 lb

## 2018-09-24 DIAGNOSIS — I739 Peripheral vascular disease, unspecified: Secondary | ICD-10-CM | POA: Diagnosis not present

## 2018-09-24 DIAGNOSIS — I714 Abdominal aortic aneurysm, without rupture, unspecified: Secondary | ICD-10-CM

## 2018-09-24 DIAGNOSIS — I10 Essential (primary) hypertension: Secondary | ICD-10-CM

## 2018-09-24 DIAGNOSIS — I713 Abdominal aortic aneurysm, ruptured, unspecified: Secondary | ICD-10-CM

## 2018-09-26 ENCOUNTER — Ambulatory Visit
Admission: RE | Admit: 2018-09-26 | Discharge: 2018-09-26 | Disposition: A | Discharge status: Home / Self Care | Payer: Medicare PPO | Source: Ambulatory Visit | Attending: Rheumatology | Admitting: Rheumatology

## 2018-09-26 DIAGNOSIS — M5416 Radiculopathy, lumbar region: Secondary | ICD-10-CM | POA: Diagnosis present

## 2018-09-29 ENCOUNTER — Encounter: Payer: Self-pay | Admitting: *Deleted

## 2018-09-29 ENCOUNTER — Encounter (INDEPENDENT_AMBULATORY_CARE_PROVIDER_SITE_OTHER): Payer: Self-pay | Admitting: Nurse Practitioner

## 2018-09-29 ENCOUNTER — Telehealth: Payer: Self-pay | Admitting: *Deleted

## 2018-09-29 DIAGNOSIS — I214 Non-ST elevation (NSTEMI) myocardial infarction: Secondary | ICD-10-CM

## 2018-09-29 NOTE — Progress Notes (Signed)
Discharge Progress Report  Patient Details  Name: Martin Adkins MRN: [id number] Date of Birth: 03-07-1945 Referring Provider:     Cardiac Rehab from 01/29/2018 in [facility] Cardiac and Pulmonary Rehab  Referring Provider  Martin Adkins       Number of Visits: 29  Reason for Discharge:  Early Exit:  Lack of attendance and Readmission for pnuemonia and recovery  Smoking History:  Social History   Tobacco Use  Smoking Status Former Smoker  . Packs/day: 0.50  . Years: 20.00  . Pack years: 10.00  . Types: Cigarettes  Smokeless Tobacco Never Used  Tobacco Comment   Quit     Diagnosis:  NSTEMI (non-ST elevated myocardial infarction) ([place])  ADL UCSD:   Initial Exercise Prescription:   Discharge Exercise Prescription (Final Exercise Prescription Changes): Exercise Prescription Changes - 08/27/18 1400      Response to Exercise   Blood Pressure (Admit)  118/64    Blood Pressure (Exercise)  144/60    Blood Pressure (Exit)  118/68    Heart Rate (Admit)  97 bpm    Heart Rate (Exercise)  108 bpm    Heart Rate (Exit)  83 bpm    Rating of Perceived Exertion (Exercise)  13    Symptoms  none    Duration  Continue with 30 min of aerobic exercise without signs/symptoms of physical distress.    Intensity  THRR unchanged      Progression   Progression  Continue to progress workloads to maintain intensity without signs/symptoms of physical distress.    Average METs  2.32      Resistance Training   Training Prescription  Yes    Weight  4 lbs    Reps  10-15      Interval Training   Interval Training  No      Treadmill   MPH  2.3    Grade  1    Minutes  15    METs  3.08      NuStep   Level  4    Minutes  15    METs  2.3      Arm Ergometer   Level  2    Minutes  15    METs  2      Biostep-RELP   Level  4    Minutes  15    METs  2      Home Exercise Plan   Plans to continue exercise at  [facility]    Frequency  Add 2 additional days to program exercise  sessions.    Initial Home Exercises Provided  02/12/18       Functional Capacity:   Psychological, QOL, Others - Outcomes: PHQ 2/9: Depression screen [facility] 2/9 01/29/2018 01/29/2018 11/16/2014 09/26/2014  Decreased Interest - 0 2 1  Down, Depressed, Hopeless 2 0 0 0  PHQ - 2 Score 2 0 2 1  Altered sleeping - 0 0 1  Tired, decreased energy 3 0 2 1  Change in appetite - 0 0 0  Feeling bad or failure about yourself  - 0 0 0  Trouble concentrating - 0 - 0  Moving slowly or fidgety/restless - 0 0 0  Suicidal thoughts - 0 0 0  PHQ-9 Score - 0 4 3  Difficult doing work/chores Somewhat difficult - Somewhat difficult Somewhat difficult  Some recent data might be hidden    Quality of Life:   Personal Goals: Goals established at orientation with interventions provided to  work toward goal.    Personal Goals Discharge: Goals and Risk Factor Review    Row Name 08/03/18 [phone]             Core Components/Risk Factors/Patient Goals Review   Personal Goals Review  Weight Management/Obesity;Lipids;Hypertension;Heart Failure;Tobacco Cessation       Review  Martin Adkins continues to keep his weight steady around 208 lbs.  He continues to have good pressure readings and checks them at home.  He has occasional shortness of breath but nothing excessive.  He continues to smoke 1-2 cig a day and is using patches to help as well.   He is not ready to set a quit date yet, but hopefully next month he will.       Expected Outcomes  Short: Continue to work on weight loss.  Long: Continue to work towards quitting smoking.          Exercise Goals and Review:   Exercise Goals Re-Evaluation: Exercise Goals Re-Evaluation    Row Name 07/28/18 0858 08/03/18 0837 08/10/18 1144 08/27/18 1445 09/02/18 1329     Exercise Goal Re-Evaluation   Exercise Goals Review  Increase Physical Activity;Increase Strength and Stamina;Understanding of Exercise Prescription  Increase Physical Activity;Increase Strength and  Stamina;Understanding of Exercise Prescription  Increase Physical Activity;Increase Strength and Stamina;Understanding of Exercise Prescription  Increase Physical Activity;Increase Strength and Stamina;Understanding of Exercise Prescription  -   Comments  Martin Adkins is off to a good start back in rehab.  He was able to get back to his normal workloads and is already up to 3 METs on the BioStep.  We will continue to monitor his progress.  Martin Adkins is doing well with exericse.  He is doing some walking, but not enought due to the heat.  He is starting to feel stronger, he wished he had more energy.  He is now going to be coming three days a week and hopefully the extra day will help with the energy boost.  Martin Adkins continues to do well in rehab. He is now up to 2.3 mph on the treadmill.  We will continue to monitor his progress.  Martin Adkins has been doing well in rehab.  He is attending three days a week now to finish up.  He is on level 4 for the NuStep and BioStep.  We will continue to monitor his progress.  Out since last review   Expected Outcomes  Short: Start to increase workloads.  Long: Continue to increase strength  Short: Continue to exercise more in general.  Long: Continue to increase stamina.  Short: Continue to increase workloads.  Long: Continue to improve stamina.  Short: Increase arm crank.  Long: Continue to exercise on off days.  -      Nutrition & Weight - Outcomes:    Nutrition:   Nutrition Discharge:   Education Questionnaire Score:   Goals reviewed with patient; copy given to patient.

## 2018-09-29 NOTE — Progress Notes (Signed)
SUBJECTIVE:  Patient ID: Martin Adkins, male    DOB: 08-18-1945, 73 y.o.   MRN: [id number] Chief Complaint  Patient presents with  . Follow-up    HPI  Martin Adkins is a 73 y.o. male that presents today 1 year after his endovascular stent repair of his abdominal aortic aneurysm.  The repair was done on 09/24/2017.  Today the patient presents without any complaints.  He endorses having some claudication symptoms however there is no worsening of the symptoms.  There is no development of rest pain or ulcer formation bilaterally.  He denies any abdominal pain, back pain or any thing consistent with thrombosis.  Overall the patient states that he feels well.  He denies any fever, chills, nausea, vomiting or diarrhea.  Currently the patient has a patent endovascular aneurysm repair with no evidence of endoleak.  The current aneurysm measurement of 5.31 cm is larger than the previous exam on 02/11/2018 but consistent with a measurement given on 11/04/2017.  The patient also underwent bilateral ABIs which revealed an ABI 0.77 on the right and 1.19 on the left.  The patient has biphasic waveforms in the bilateral anterior tibial arteries.  The right posterior tibial artery has triphasic waveforms however the left posterior tibial has monophasic waveforms.  The right digit toe waveforms are strong whereas the left is somewhat dampened.  Past Medical History:  Diagnosis Date  . AAA (abdominal aortic aneurysm) without rupture ([place]) 04/05/2014  . AAA (abdominal aortic aneurysm) without rupture ([place]) 04/05/2014  . Abdominal aortic aneurysm without rupture ([place])   . Acute non-ST elevation myocardial infarction (NSTEMI) ([place]) 08/15/2014  . 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/28/2014  . APS (antiphospholipid syndrome) ([place])   . Arteriosclerosis of coronary artery 04/05/2014   Overview:  Sp cabg with lima to lad svg to d1 om1 rca 2004 with occluded svg to rca and d1 and pci stetn of om1 graft 2015   .  Arthritis   . B12 deficiency 03/21/2017  . Barrett's esophagus   . Benign essential HTN 05/02/2014  . BPH (benign prostatic hyperplasia)   . CAD (coronary artery disease)   . CHF (congestive heart failure) ([place])   . Chronic systolic heart failure ([place]) 04/19/2014   Overview:  With segmental LV systolic dysfunction ejection fraction of 35%   . CLL (chronic lymphocytic leukemia) ([place])   . CLL (chronic lymphocytic leukemia) ([place]) 05/24/2017  . DVT (deep venous thrombosis) ([place])   . Dysrhythmia    ATRIAL FIB.  Marland Kitchen GERD (gastroesophageal reflux disease)   . History of hiatal hernia   . History of shingles 2013  . Hyperlipemia   . Hyperplastic polyps of stomach   . Hypertension   . Myocardial infarction ([place]) 07/30/2014  . NSTEMI (non-ST elevated myocardial infarction) ([place])   . OSA (obstructive sleep apnea)   . Osteoarthritis   . Pre-diabetes   . PVD (peripheral vascular disease) ([place])   . RA (rheumatoid arthritis) ([place])   . SOB (shortness of breath)     Past Surgical History:  Procedure Laterality Date  . ABDOMINAL AORTA STENT    . CHOLECYSTECTOMY    . COLONOSCOPY  11/24/2009  . CORONARY ANGIOPLASTY WITH STENT PLACEMENT  2015  . CORONARY ARTERY BYPASS GRAFT    . ENDOVASCULAR REPAIR/STENT GRAFT N/A 09/24/2017   Procedure: ENDOVASCULAR REPAIR/STENT GRAFT;  Surgeon: Algernon Huxley, MD;  Location: [facility];  Service: Cardiovascular;  Laterality: N/A;  . ESOPHAGOGASTRODUODENOSCOPY  05/26/02  03/23/12  . ESOPHAGOGASTRODUODENOSCOPY (  EGD) WITH PROPOFOL N/A 10/28/2016   Procedure: ESOPHAGOGASTRODUODENOSCOPY (EGD) WITH PROPOFOL;  Surgeon: Manya Silvas, MD;  Location: [facility] ENDOS[facility];  Service: Endoscopy;  Laterality: N/A;  . LEFT HEART CATH AND CORONARY ANGIOGRAPHY N/A 01/19/2018   Procedure: LEFT HEART CATH AND CORONARY ANGIOGRAPHY;  Surgeon: Teodoro Spray, MD;  Location: [place] [facility];  Service: Cardiovascular;  Laterality: N/A;  . PERIPHERAL VASCULAR CATHETERIZATION N/A  09/06/2014   Procedure: IVC Filter Removal;  Surgeon: Katha Cabal, MD;  Location: [place] [facility];  Service: Cardiovascular;  Laterality: N/A;  . TRANSURETHRAL RESECTION OF PROSTATE N/A 02/20/2015   Procedure: TRANSURETHRAL RESECTION OF THE PROSTATE (TURP);  Surgeon: Hollice Espy, MD;  Location: [facility];  Service: Urology;  Laterality: N/A;    Social History   Socioeconomic History  . Marital status: Legally Separated    Spouse name: Not on file  . Number of children: Not on file  . Years of education: Not on file  . Highest education level: Not on file  Occupational History  . Not on file  Social Needs  . Financial resource strain: Not hard at all  . Food insecurity    Worry: Never true    Inability: Never true  . Transportation needs    Medical: No    Non-medical: No  Tobacco Use  . Smoking status: Former Smoker    Packs/day: 0.50    Years: 20.00    Pack years: 10.00    Types: Cigarettes  . Smokeless tobacco: Never Used  . Tobacco comment: Quit   Substance and Sexual Activity  . Alcohol use: Yes    Alcohol/week: 1.0 - 2.0 standard drinks    Types: 1 - 2 Shots of liquor per week    Comment: social on weekends  . Drug use: No  . Sexual activity: Never  Lifestyle  . Physical activity    Days per week: 7 days    Minutes per session: 40 min  . Stress: Not at all  Relationships  . Social connections    Talks on phone: More than three times a week    Gets together: More than three times a week    Attends religious service: More than 4 times per year    Active member of club or organization: No    Attends meetings of clubs or organizations: Never    Relationship status: Separated  . Intimate partner violence    Fear of current or ex partner: No    Emotionally abused: No    Physically abused: No    Forced sexual activity: No  Other Topics Concern  . Not on file  Social History Narrative  . Not on file    Family History  Problem Relation Age of  Onset  . Cancer Mother 70       breast ca  . Diabetes Mother   . Coronary artery disease Father   . Heart attack Father   . Prostate cancer Brother   . Bladder Cancer Neg Hx   . Kidney cancer Neg Hx     Allergies  Allergen Reactions  . Ace Inhibitors Other (See Comments)    Other reaction(s): Cough  . Pravastatin Other (See Comments)    Other reaction(s): Muscle Pain  . Rosuvastatin Other (See Comments)    Other reaction(s): Other (See Comments) GI bleed  . Simvastatin Other (See Comments)    Other reaction(s): Muscle Pain  . Amoxicillin Rash  . Niacin Rash  . Penicillins Rash  Has patient had a PCN reaction causing immediate rash, facial/tongue/throat swelling, SOB or lightheadedness with hypotension: Yes Has patient had a PCN reaction causing severe rash involving mucus membranes or skin necrosis: Unknown Has patient had a PCN reaction that required hospitalization: Unknown Has patient had a PCN reaction occurring within the last 10 years: No If all of the above answers are "NO", then may proceed with Cephalosporin use.     Review of Systems   Review of Systems: Negative Unless Checked Constitutional: [] Weight loss  [] Fever  [] Chills Cardiac: [] Chest pain   []  Atrial Fibrillation  [] Palpitations   [] Shortness of breath when laying flat   [] Shortness of breath with exertion. [] Shortness of breath at rest Vascular:  [x] Pain in legs with walking   [] Pain in legs with standing [] Pain in legs when laying flat   [] Claudication    [] Pain in feet when laying flat    [] History of DVT   [] Phlebitis   [] Swelling in legs   [] Varicose veins   [] Non-healing ulcers Pulmonary:   [] Uses home oxygen   [] Productive cough   [] Hemoptysis   [] Wheeze  [] COPD   [] Asthma Neurologic:  [] Dizziness   [] Seizures  [] Blackouts [] History of stroke   [] History of TIA  [] Aphasia   [] Temporary Blindness   [] Weakness or numbness in arm   [] Weakness or numbness in leg Musculoskeletal:   [] Joint swelling    [x] Joint pain   [x] Low back pain  []  History of Knee Replacement [x] Arthritis [] back Surgeries  []  Spinal Stenosis    Hematologic:  [] Easy bruising  [] Easy bleeding   [] Hypercoagulable state   [] Anemic Gastrointestinal:  [] Diarrhea   [] Vomiting  [] Gastroesophageal reflux/heartburn   [] Difficulty swallowing. [] Abdominal pain Genitourinary:  [x] Chronic kidney disease   [] Difficult urination  [] Anuric   [] Blood in urine [] Frequent urination  [] Burning with urination   [] Hematuria Skin:  [] Rashes   [] Ulcers [] Wounds Psychological:  [] History of anxiety   []  History of major depression  []  Memory Difficulties      OBJECTIVE:   Physical Exam  BP (!) 142/89 (BP Location: Left Arm, Patient Position: Sitting, Cuff Size: Normal)   Pulse 63   Resp 14   Ht 6' (1.829 m)   Wt 202 lb (91.6 kg)   BMI 27.40 kg/m   Gen: WD/WN, NAD Head: [place]/AT, No temporalis wasting.  Ear/Nose/Throat: Hearing grossly intact, nares w/o erythema or drainage Eyes: PER, EOMI, sclera nonicteric.  Neck: Supple, no masses.  No JVD.  Pulmonary:  Good air movement, no use of accessory muscles.  Cardiac: RRR Vascular:  Minimal lower extremity edema Vessel Right Left  Radial Palpable Palpable  Dorsalis Pedis Palpable Not Palpable  Posterior Tibial Palpable Palpable   Gastrointestinal: soft, non-distended. No guarding/no peritoneal signs.  Musculoskeletal: M/S 5/5 throughout.  No deformity or atrophy.  Neurologic: Pain and light touch intact in extremities.  Symmetrical.  Speech is fluent. Motor exam as listed above. Psychiatric: Judgment intact, Mood & affect appropriate for pt's clinical situation. Dermatologic: No Venous rashes. No Ulcers Noted.  No changes consistent with cellulitis. Lymph : No Cervical lymphadenopathy, no lichenification or skin changes of chronic lymphedema.       ASSESSMENT AND PLAN:  1. AAA (abdominal aortic aneurysm) without rupture (HCC) Currently the patient has a patent endovascular  aneurysm repair with no evidence of endoleak.  The current aneurysm measurement of 5.31 cm is larger than the previous exam on 02/11/2018 but consistent with a measurement given on 11/04/2017. No surgery or intervention at  this time.  I have also discussed optimizing medical management with hypertension and lipid control and the importance of abstinence from tobacco.  The patient is also encouraged to exercise a minimum of 30 minutes 4 times a week.  Should the patient develop new onset abdominal or back pain or signs of peripheral embolization they are instructed to seek medical attention immediately and to alert the physician providing care that they have an aneurysm.  The patient voices their understanding. The patient will return in 12 months with an aortic duplex.  2. Benign essential HTN Continue antihypertensive medications as already ordered, these medications have been reviewed and there are no changes at this time.   3. Peripheral vascular disease (HCC)  Recommend:  The patient has evidence of atherosclerosis of the lower extremities with claudication.  The patient does not voice lifestyle limiting changes at this point in time.  Noninvasive studies do not suggest clinically significant change.  No invasive studies, angiography or surgery at this time The patient should continue walking and begin a more formal exercise program.  The patient should continue antiplatelet therapy and aggressive treatment of the lipid abnormalities  No changes in the patient's medications at this time  The patient should continue wearing graduated compression socks 10-15 mmHg strength to control the mild edema.     Current Outpatient Medications on File Prior to Visit  Medication Sig Dispense Refill  . albuterol (PROVENTIL HFA;VENTOLIN HFA) 108 (90 Base) MCG/ACT inhaler Inhale 2 puffs into the lungs every 6 (six) hours as needed for wheezing or shortness of breath. 1 Inhaler 0  . amLODipine  (NORVASC) 10 MG tablet Take 10 mg by mouth daily.     Marland Kitchen atorvastatin (LIPITOR) 80 MG tablet Take 80 mg by mouth daily.   11  . dorzolamide (TRUSOPT) 2 % ophthalmic solution INSTILL ONE DROP IN [facility] EYE TWICE DAILY    . fluticasone (FLONASE) 50 MCG/ACT nasal spray Place 2 sprays into both nostrils daily.     Marland Kitchen gabapentin (NEURONTIN) 300 MG capsule Take 1 capsule (300 mg total) by mouth 3 (three) times daily. (Patient taking differently: Take 300 mg by mouth 2 (two) times daily. ) 90 capsule 0  . Ipratropium-Albuterol (COMBIVENT) 20-100 MCG/ACT AERS respimat Inhale 1 puff into the lungs every 6 (six) hours. 1 Inhaler 11  . latanoprost (XALATAN) 0.005 % ophthalmic solution INSTILL ONE DROP IN [facility] EYE AT BEDTIME    . metoprolol succinate (TOPROL-XL) 100 MG 24 hr tablet Take 100 mg by mouth daily.     . pantoprazole (PROTONIX) 40 MG tablet Take 40 mg by mouth daily.     . potassium chloride SA (K-DUR,KLOR-CON) 20 MEQ tablet Take 20 mEq by mouth daily.     . rivaroxaban (XARELTO) 20 MG TABS tablet Take 1 tablet (20 mg total) by mouth daily with supper. 30 tablet 3  . isosorbide mononitrate (IMDUR) 60 MG 24 hr tablet Take 1 tablet (60 mg total) by mouth daily. 30 tablet 0   No current facility-administered medications on file prior to visit.     There are no Patient Instructions on file for this visit. No follow-ups on file.   Kris Hartmann, NP  This note was completed with Sales executive.  Any errors are purely unintentional.

## 2018-09-29 NOTE — Telephone Encounter (Signed)
Martin Adkins returned my call.  He is still slowly regaining his strength.  He has a lot of things going on right now besides his rehab.  He would like to discharge at this time.

## 2018-09-29 NOTE — Progress Notes (Signed)
Cardiac Individual Treatment Plan  Patient Details  Name: Martin Adkins MRN: [id number] Date of Birth: July 05, 1945 Referring Provider:     Cardiac Rehab from 01/29/2018 in [facility] Cardiac and Pulmonary Rehab  Referring Provider  [name]      Initial Encounter Date:    Cardiac Rehab from 01/29/2018 in [facility] Cardiac and Pulmonary Rehab  Date  01/29/18      Visit Diagnosis: NSTEMI (non-ST elevated myocardial infarction) [facility])  Patient's Home Medications on Admission:  Current Outpatient Medications:  .  albuterol (PROVENTIL HFA;VENTOLIN HFA) 108 (90 Base) MCG/ACT inhaler, Inhale 2 puffs into the lungs every 6 (six) hours as needed for wheezing or shortness of breath., Disp: 1 Inhaler, Rfl: 0 .  amLODipine (NORVASC) 10 MG tablet, Take 10 mg by mouth daily. , Disp: , Rfl:  .  atorvastatin (LIPITOR) 80 MG tablet, Take 80 mg by mouth daily. , Disp: , Rfl: 11 .  dorzolamide (TRUSOPT) 2 % ophthalmic solution, INSTILL ONE DROP IN [facility] EYE TWICE DAILY, Disp: , Rfl:  .  fluticasone (FLONASE) 50 MCG/ACT nasal spray, Place 2 sprays into both nostrils daily. , Disp: , Rfl:  .  gabapentin (NEURONTIN) 300 MG capsule, Take 1 capsule (300 mg total) by mouth 3 (three) times daily. (Patient taking differently: Take 300 mg by mouth 2 (two) times daily. ), Disp: 90 capsule, Rfl: 0 .  Ipratropium-Albuterol (COMBIVENT) 20-100 MCG/ACT AERS respimat, Inhale 1 puff into the lungs every 6 (six) hours., Disp: 1 Inhaler, Rfl: 11 .  isosorbide mononitrate (IMDUR) 60 MG 24 hr tablet, Take 1 tablet (60 mg total) by mouth daily., Disp: 30 tablet, Rfl: 0 .  latanoprost (XALATAN) 0.005 % ophthalmic solution, INSTILL ONE DROP IN [facility] EYE AT BEDTIME, Disp: , Rfl:  .  metoprolol succinate (TOPROL-XL) 100 MG 24 hr tablet, Take 100 mg by mouth daily. , Disp: , Rfl:  .  pantoprazole (PROTONIX) 40 MG tablet, Take 40 mg by mouth daily. , Disp: , Rfl:  .  potassium chloride SA (K-DUR,KLOR-CON) 20 MEQ tablet, Take 20 mEq by mouth  daily. , Disp: , Rfl:  .  rivaroxaban (XARELTO) 20 MG TABS tablet, Take 1 tablet (20 mg total) by mouth daily with supper., Disp: 30 tablet, Rfl: 3  Past Medical History: Past Medical History:  Diagnosis Date  . AAA (abdominal aortic aneurysm) without rupture ([place]) 04/05/2014  . AAA (abdominal aortic aneurysm) without rupture ([place]) 04/05/2014  . Abdominal aortic aneurysm without rupture ([place])   . Acute non-ST elevation myocardial infarction (NSTEMI) ([place]) 08/15/2014  . 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/28/2014  . APS (antiphospholipid syndrome) ([place])   . Arteriosclerosis of coronary artery 04/05/2014   Overview:  Sp cabg with lima to lad svg to d1 om1 rca 2004 with occluded svg to rca and d1 and pci stetn of om1 graft 2015   . Arthritis   . B12 deficiency 03/21/2017  . Barrett's esophagus   . Benign essential HTN 05/02/2014  . BPH (benign prostatic hyperplasia)   . CAD (coronary artery disease)   . CHF (congestive heart failure) ([place])   . Chronic systolic heart failure ([place]) 04/19/2014   Overview:  With segmental LV systolic dysfunction ejection fraction of 35%   . CLL (chronic lymphocytic leukemia) ([place])   . CLL (chronic lymphocytic leukemia) ([place]) 05/24/2017  . DVT (deep venous thrombosis) ([place])   . Dysrhythmia    ATRIAL FIB.  Marland Kitchen GERD (gastroesophageal reflux disease)   . History of hiatal hernia   . History of  shingles 2013  . Hyperlipemia   . Hyperplastic polyps of stomach   . Hypertension   . Myocardial infarction ([place]) 07/30/2014  . NSTEMI (non-ST elevated myocardial infarction) ([place])   . OSA (obstructive sleep apnea)   . Osteoarthritis   . Pre-diabetes   . PVD (peripheral vascular disease) ([place])   . RA (rheumatoid arthritis) ([place])   . SOB (shortness of breath)     Tobacco Use: Social History   Tobacco Use  Smoking Status Former Smoker  . Packs/day: 0.50  . Years: 20.00  . Pack years: 10.00  . Types: Cigarettes  Smokeless Tobacco Never Used  Tobacco Comment   Quit      Labs: Recent Review Flowsheet Data    Labs for ITP Cardiac and Pulmonary Rehab Latest Ref Rng & Units 05/03/2013 01/19/2018   Cholestrol 0 - 200 mg/dL 196 116   LDLCALC 0 - 99 mg/dL 121(H) 49   HDL >40 mg/dL 42 51   Trlycerides <150 mg/dL 164 81       Exercise Target Goals: Exercise Program Goal: Individual exercise prescription set using results from initial 6 min walk test and THRR while considering  patient's activity barriers and safety.   Exercise Prescription Goal: Initial exercise prescription builds to 30-45 minutes a day of aerobic activity, 2-3 days per week.  Home exercise guidelines will be given to patient during program as part of exercise prescription that the participant will acknowledge.  Activity Barriers & Risk Stratification:   6 Minute Walk:   Oxygen Initial Assessment:   Oxygen Re-Evaluation:   Oxygen Discharge (Final Oxygen Re-Evaluation):   Initial Exercise Prescription:   Perform Capillary Blood Glucose checks as needed.  Exercise Prescription Changes: Exercise Prescription Changes    Row Name 07/28/18 0900 08/10/18 1100 08/27/18 1400         Response to Exercise   Blood Pressure (Admit)  132/70  124/64  118/64     Blood Pressure (Exercise)  140/82  134/60  144/60     Blood Pressure (Exit)  126/56  116/64  118/68     Heart Rate (Admit)  96 bpm  89 bpm  97 bpm     Heart Rate (Exercise)  123 bpm  118 bpm  108 bpm     Heart Rate (Exit)  95 bpm  94 bpm  83 bpm     Rating of Perceived Exertion (Exercise)  '12  13  13     ' Symptoms  none  none  none     Duration  Continue with 30 min of aerobic exercise without signs/symptoms of physical distress.  Continue with 30 min of aerobic exercise without signs/symptoms of physical distress.  Continue with 30 min of aerobic exercise without signs/symptoms of physical distress.     Intensity  THRR unchanged  THRR unchanged  THRR unchanged       Progression   Progression  Continue to progress  workloads to maintain intensity without signs/symptoms of physical distress.  Continue to progress workloads to maintain intensity without signs/symptoms of physical distress.  Continue to progress workloads to maintain intensity without signs/symptoms of physical distress.     Average METs  3.32  2.3  2.32       Resistance Training   Training Prescription  Yes  Yes  Yes     Weight  4 lbs  4 lbs  4 lbs     Reps  10-15  10-15  10-15       Interval Training  Interval Training  No  No  No       Treadmill   MPH  2.2  2.3  2.3     Grade  '1  1  1     ' Minutes  '15  15  15     ' METs  2.99  3.08  3.08       Recumbant Bike   Level  2  -  -     Watts  60  -  -     Minutes  15  -  -     METs  3.99  -  -       NuStep   Level  4  -  4     Minutes  15  -  15     METs  -  -  2.3       Arm Ergometer   Level  -  2  2     Minutes  -  15  15     METs  -  2.1  2       T5 Nustep   Level  -  4  -     Minutes  -  15  -     METs  -  2  -       Biostep-RELP   Level  -  2  4     Minutes  -  15  15     METs  -  2  2       Home Exercise Plan   Plans to continue exercise at  [facility]     Frequency  Add 2 additional days to program exercise sessions.  Add 2 additional days to program exercise sessions.  Add 2 additional days to program exercise sessions.     Initial Home Exercises Provided  02/12/18  02/12/18  02/12/18        Exercise Comments:   Exercise Goals and Review:   Exercise Goals Re-Evaluation : Exercise Goals Re-Evaluation    Row Name 07/28/18 4270 08/03/18 0837 08/10/18 1144 08/27/18 1445 09/02/18 1329     Exercise Goal Re-Evaluation   Exercise Goals Review  Increase Physical Activity;Increase Strength and Stamina;Understanding of Exercise Prescription  Increase Physical Activity;Increase Strength and Stamina;Understanding of Exercise Prescription  Increase Physical Activity;Increase Strength and Stamina;Understanding of  Exercise Prescription  Increase Physical Activity;Increase Strength and Stamina;Understanding of Exercise Prescription  -   Comments  [name] is off to a good start back in rehab.  He was able to get back to his normal workloads and is already up to 3 METs on the BioStep.  We will continue to monitor his progress.  [name] is doing well with exericse.  He is doing some walking, but not enought due to the heat.  He is starting to feel stronger, he wished he had more energy.  He is now going to be coming three days a week and hopefully the extra day will help with the energy boost.  [name] continues to do well in rehab. He is now up to 2.3 mph on the treadmill.  We will continue to monitor his progress.  [name] has been doing well in rehab.  He is attending three days a week now to finish up.  He is on level 4 for the NuStep and BioStep.  We will continue to monitor his progress.  Out since last review  Expected Outcomes  Short: Start to increase workloads.  Long: Continue to increase strength  Short: Continue to exercise more in general.  Long: Continue to increase stamina.  Short: Continue to increase workloads.  Long: Continue to improve stamina.  Short: Increase arm crank.  Long: Continue to exercise on off days.  -      Discharge Exercise Prescription (Final Exercise Prescription Changes): Exercise Prescription Changes - 08/27/18 1400      Response to Exercise   Blood Pressure (Admit)  118/64    Blood Pressure (Exercise)  144/60    Blood Pressure (Exit)  118/68    Heart Rate (Admit)  97 bpm    Heart Rate (Exercise)  108 bpm    Heart Rate (Exit)  83 bpm    Rating of Perceived Exertion (Exercise)  13    Symptoms  none    Duration  Continue with 30 min of aerobic exercise without signs/symptoms of physical distress.    Intensity  THRR unchanged      Progression   Progression  Continue to progress workloads to maintain intensity without signs/symptoms of physical distress.    Average METs  2.32       Resistance Training   Training Prescription  Yes    Weight  4 lbs    Reps  10-15      Interval Training   Interval Training  No      Treadmill   MPH  2.3    Grade  1    Minutes  15    METs  3.08      NuStep   Level  4    Minutes  15    METs  2.3      Arm Ergometer   Level  2    Minutes  15    METs  2      Biostep-RELP   Level  4    Minutes  15    METs  2      Home Exercise Plan   Plans to continue exercise at  [facility]    Frequency  Add 2 additional days to program exercise sessions.    Initial Home Exercises Provided  02/12/18       Nutrition:  Target Goals: Understanding of nutrition guidelines, daily intake of sodium <1539m, cholesterol <2071m calories 30% from fat and 7% or less from saturated fats, daily to have 5 or more servings of fruits and vegetables.  Biometrics:    Nutrition Therapy Plan and Nutrition Goals:   Nutrition Assessments:   Nutrition Goals Re-Evaluation: Nutrition Goals Re-Evaluation    RoEphraimame 07/22/18 0857 08/14/18 0828           Goals   Current Weight  -  207 lb 8 oz (94.1 kg)      Nutrition Goal  ST: work on adding one F or different V serving a day LT: feel better and gain more energy  ST: work on adding one F or different V serving a day  LT: feel better and gain more energy      Comment  [name] continues to eat vegetables and a few fruits, talked about trying to incorporate a fruit that he enjoys, but we could add one more different vegetable serving if he cannot find one he enjoys to have in his diet. He sticks to his whole grains and lean meats, he has increased his protein and eating beans, fish, and lean poultry without the skin. Overall, he  feels that he has a good grasp on his diet.  Pt reports eating a variety of vegetables besides leafy greens as he is on a blood thinner; pt used to be on warfarin, now on Xarelto which does not need consistent vitamin K intake like warfarin or coumadin (verified on website),  told pt he can have his green leafy vegetables to increase variety. Pt didn't want to make any new goals and thinks he is doing well with his diet. Pt reports his arthritis has been bothering him, told him if it gets too bad and interferes with his diet, that we could work together to make meals that would be less taxing on his wrists and hands.      Expected Outcome  Short: Try more fruits or different vegetable for variety  Long: Continue to follow healthy eating plan  Short: Try more fruits or different vegetable for variety  Long: Continue to follow healthy eating plan         Nutrition Goals Discharge (Final Nutrition Goals Re-Evaluation): Nutrition Goals Re-Evaluation - 08/14/18 0828      Goals   Current Weight  207 lb 8 oz (94.1 kg)    Nutrition Goal  ST: work on adding one F or different V serving a day  LT: feel better and gain more energy    Comment  Pt reports eating a variety of vegetables besides leafy greens as he is on a blood thinner; pt used to be on warfarin, now on Xarelto which does not need consistent vitamin K intake like warfarin or coumadin (verified on website), told pt he can have his green leafy vegetables to increase variety. Pt didn't want to make any new goals and thinks he is doing well with his diet. Pt reports his arthritis has been bothering him, told him if it gets too bad and interferes with his diet, that we could work together to make meals that would be less taxing on his wrists and hands.    Expected Outcome  Short: Try more fruits or different vegetable for variety  Long: Continue to follow healthy eating plan       Psychosocial: Target Goals: Acknowledge presence or absence of significant depression and/or stress, maximize coping skills, provide positive support system. Participant is able to verbalize types and ability to use techniques and skills needed for reducing stress and depression.   Initial Review & Psychosocial Screening:   Quality of Life  Scores:   Scores of 19 and below usually indicate a poorer quality of life in these areas.  A difference of  2-3 points is a clinically meaningful difference.  A difference of 2-3 points in the total score of the Quality of Life Index has been associated with significant improvement in overall quality of life, self-image, physical symptoms, and general health in studies assessing change in quality of life.  PHQ-9: Recent Review Flowsheet Data    Depression screen [facility] 2/9 01/29/2018 01/29/2018 11/16/2014 09/26/2014   Decreased Interest - 0 2 1   Down, Depressed, Hopeless 2 0 0 0   PHQ - 2 Score 2 0 2 1   Altered sleeping - 0 0 1   Tired, decreased energy 3 0 2 1   Change in appetite - 0 0 0   Feeling bad or failure about yourself  - 0 0 0   Trouble concentrating - 0 - 0   Moving slowly or fidgety/restless - 0 0 0   Suicidal thoughts - 0 0 0  PHQ-9 Score - 0 4 3   Difficult doing work/chores Somewhat difficult - Somewhat difficult Somewhat difficult     Interpretation of Total Score  Total Score Depression Severity:  1-4 = Minimal depression, 5-9 = Mild depression, 10-14 = Moderate depression, 15-19 = Moderately severe depression, 20-27 = Severe depression   Psychosocial Evaluation and Intervention:   Psychosocial Re-Evaluation: Psychosocial Re-Evaluation    [place] Name 08/03/18 0840             Psychosocial Re-Evaluation   Current issues with  Current Stress Concerns       Comments  [name] is doing well.  He wishes his energy levels were higher, but he is getting better.  He continues to have some hip and back pain with his left leg.  He is planning to go back to rehuematologist again for help.  He continues to sleep well and doing well overall.       Expected Outcomes  Short: Continue to seek treatment for hip and back.  Long: Continue to stay positive.       Interventions  Encouraged to attend Cardiac Rehabilitation for the exercise          Psychosocial Discharge (Final  Psychosocial Re-Evaluation): Psychosocial Re-Evaluation - 08/03/18 0840      Psychosocial Re-Evaluation   Current issues with  Current Stress Concerns    Comments  [name] is doing well.  He wishes his energy levels were higher, but he is getting better.  He continues to have some hip and back pain with his left leg.  He is planning to go back to rehuematologist again for help.  He continues to sleep well and doing well overall.    Expected Outcomes  Short: Continue to seek treatment for hip and back.  Long: Continue to stay positive.    Interventions  Encouraged to attend Cardiac Rehabilitation for the exercise       Vocational Rehabilitation: Provide vocational rehab assistance to qualifying candidates.   Vocational Rehab Evaluation & Intervention:   Education: Education Goals: Education classes will be provided on a variety of topics geared toward better understanding of heart health and risk factor modification. Participant will state understanding/return demonstration of topics presented as noted by education test scores.  Learning Barriers/Preferences:   Education Topics:  AED/CPR: - Group verbal and written instruction with the use of models to demonstrate the basic use of the AED with the basic ABC's of resuscitation.   General Nutrition Guidelines/Fats and Fiber: -Group instruction provided by verbal, written material, models and posters to present the general guidelines for heart healthy nutrition. Gives an explanation and review of dietary fats and fiber.   Cardiac Rehab from 11/21/2014 in [facility] Cardiac and Pulmonary Rehab  Date  10/17/14  Educator  PI  Instruction Review Code (retired)  2- meets goals/outcomes      Controlling Sodium/Reading Food Labels: -Group verbal and written material supporting the discussion of sodium use in heart healthy nutrition. Review and explanation with models, verbal and written materials for utilization of the food label.   Cardiac Rehab  from 03/19/2018 in [facility] Cardiac and Pulmonary Rehab  Date  03/19/18  Educator  [facility]  Instruction Review Code  5- Refused Teaching      Exercise Physiology & General Exercise Guidelines: - Group verbal and written instruction with models to review the exercise physiology of the cardiovascular system and associated critical values. Provides general exercise guidelines with specific guidelines to those with heart or lung disease.  Cardiac Rehab from 11/21/2014 in [facility] Cardiac and Pulmonary Rehab  Date  11/09/14  Educator  RM  Instruction Review Code (retired)  2- meets goals/outcomes      Aerobic Exercise & Resistance Training: - Gives group verbal and written instruction on the various components of exercise. Focuses on aerobic and resistive training programs and the benefits of this training and how to safely progress through these programs..   Cardiac Rehab from 11/21/2014 in [facility] Cardiac and Pulmonary Rehab  Date  11/14/14  Educator  RM  Instruction Review Code (retired)  2- Statistician, Balance, Mind/Body Relaxation: Provides group verbal/written instruction on the benefits of flexibility and balance training, including mind/body exercise modes such as yoga, pilates and tai chi.  Demonstration and skill practice provided.   Cardiac Rehab from 03/19/2018 in [facility] Cardiac and Pulmonary Rehab  Date  02/03/18  Educator  AS  Instruction Review Code  1- Verbalizes Understanding      Stress and Anxiety: - Provides group verbal and written instruction about the health risks of elevated stress and causes of high stress.  Discuss the correlation between heart/lung disease and anxiety and treatment options. Review healthy ways to manage with stress and anxiety.   Cardiac Rehab from 03/19/2018 in [facility] Cardiac and Pulmonary Rehab  Date  02/24/18  Educator  [facility]  Instruction Review Code  5- Refused Teaching      Depression: - Provides group verbal and written  instruction on the correlation between heart/lung disease and depressed mood, treatment options, and the stigmas associated with seeking treatment.   Cardiac Rehab from 03/19/2018 in [facility] Cardiac and Pulmonary Rehab  Date  03/10/18  Educator  [facility]  Instruction Review Code  5- Refused Teaching      Anatomy & Physiology of the Heart: - Group verbal and written instruction and models provide basic cardiac anatomy and physiology, with the coronary electrical and arterial systems. Review of Valvular disease and Heart Failure   Cardiac Rehab from 03/19/2018 in [facility] Cardiac and Pulmonary Rehab  Date  02/12/18  Educator  CE  Instruction Review Code  5- Refused Teaching      Cardiac Procedures: - Group verbal and written instruction to review commonly prescribed medications for heart disease. Reviews the medication, class of the drug, and side effects. Includes the steps to properly store meds and maintain the prescription regimen. (beta blockers and nitrates)   Cardiac Rehab from 03/19/2018 in [facility] Cardiac and Pulmonary Rehab  Date  02/26/18  Educator  CE  Instruction Review Code  5- Refused Teaching      Cardiac Medications I: - Group verbal and written instruction to review commonly prescribed medications for heart disease. Reviews the medication, class of the drug, and side effects. Includes the steps to properly store meds and maintain the prescription regimen.   Cardiac Rehab from 03/19/2018 in [facility] Cardiac and Pulmonary Rehab  Date  02/17/18  Educator  SB  Instruction Review Code  1- Verbalizes Understanding      Cardiac Medications II: -Group verbal and written instruction to review commonly prescribed medications for heart disease. Reviews the medication, class of the drug, and side effects. (all other drug classes)   Cardiac Rehab from 03/19/2018 in [facility] Cardiac and Pulmonary Rehab  Date  02/05/18  Educator  CE  Instruction Review Code  1- Verbalizes Understanding       Go  Sex-Intimacy & Heart Disease, Get SMART - Goal Setting: - Group verbal and  written instruction through game format to discuss heart disease and the return to sexual intimacy. Provides group verbal and written material to discuss and apply goal setting through the application of the S.M.A.R.T. Method.   Cardiac Rehab from 03/19/2018 in [facility] Cardiac and Pulmonary Rehab  Date  02/26/18  Educator  CE  Instruction Review Code  5- Refused Teaching      Other Matters of the Heart: - Provides group verbal, written materials and models to describe Stable Angina and Peripheral Artery. Includes description of the disease process and treatment options available to the cardiac patient.   Cardiac Rehab from 03/19/2018 in [facility] Cardiac and Pulmonary Rehab  Date  02/12/18  Educator  CE  Instruction Review Code  1- Verbalizes Understanding      Exercise & Equipment Safety: - Individual verbal instruction and demonstration of equipment use and safety with use of the equipment.   Cardiac Rehab from 03/19/2018 in [facility] Cardiac and Pulmonary Rehab  Date  01/29/18  Educator  sb  Instruction Review Code  1- Verbalizes Understanding      Infection Prevention: - Provides verbal and written material to individual with discussion of infection control including proper hand washing and proper equipment cleaning during exercise session.   Cardiac Rehab from 03/19/2018 in [facility] Cardiac and Pulmonary Rehab  Date  01/29/18  Educator  SB1  Instruction Review Code  1- Verbalizes Understanding      Falls Prevention: - Provides verbal and written material to individual with discussion of falls prevention and safety.   Cardiac Rehab from 03/19/2018 in [facility] Cardiac and Pulmonary Rehab  Date  01/29/18  Educator  sb  Instruction Review Code  1- Verbalizes Understanding      Diabetes: - Individual verbal and written instruction to review signs/symptoms of diabetes, desired ranges of glucose level fasting, after meals  and with exercise. Acknowledge that pre and post exercise glucose checks will be done for 3 sessions at entry of program.   Know Your Numbers and Risk Factors: -Group verbal and written instruction about important numbers in your health.  Discussion of what are risk factors and how they play a role in the disease process.  Review of Cholesterol, Blood Pressure, Diabetes, and BMI and the role they play in your overall health.   Cardiac Rehab from 03/19/2018 in [facility] Cardiac and Pulmonary Rehab  Date  02/05/18  Educator  CE  Instruction Review Code  1- Verbalizes Understanding      Sleep Hygiene: -Provides group verbal and written instruction about how sleep can affect your health.  Define sleep hygiene, discuss sleep cycles and impact of sleep habits. Review good sleep hygiene tips.    Cardiac Rehab from 03/19/2018 in [facility] Cardiac and Pulmonary Rehab  Date  02/10/18  Educator  CE  Instruction Review Code  5- Refused Teaching      Other: -Provides group and verbal instruction on various topics (see comments)   Knowledge Questionnaire Score:   Core Components/Risk Factors/Patient Goals at Admission:   Core Components/Risk Factors/Patient Goals Review:  Goals and Risk Factor Review    Row Name 08/03/18 0841             Core Components/Risk Factors/Patient Goals Review   Personal Goals Review  Weight Management/Obesity;Lipids;Hypertension;Heart Failure;Tobacco Cessation       Review  [name] continues to keep his weight steady around 208 lbs.  He continues to have good pressure readings and checks them at home.  He has occasional shortness of breath  but nothing excessive.  He continues to smoke 1-2 cig a day and is using patches to help as well.   He is not ready to set a quit date yet, but hopefully next month he will.       Expected Outcomes  Short: Continue to work on weight loss.  Long: Continue to work towards quitting smoking.          Core Components/Risk Factors/Patient  Goals at Discharge (Final Review):  Goals and Risk Factor Review - 08/03/18 0841      Core Components/Risk Factors/Patient Goals Review   Personal Goals Review  Weight Management/Obesity;Lipids;Hypertension;Heart Failure;Tobacco Cessation    Review  [name] continues to keep his weight steady around 208 lbs.  He continues to have good pressure readings and checks them at home.  He has occasional shortness of breath but nothing excessive.  He continues to smoke 1-2 cig a day and is using patches to help as well.   He is not ready to set a quit date yet, but hopefully next month he will.    Expected Outcomes  Short: Continue to work on weight loss.  Long: Continue to work towards quitting smoking.       ITP Comments: ITP Comments    Row Name 07/22/18 1359 08/19/18 0624 09/02/18 1328 09/15/18 0902 09/16/18 0633   ITP Comments  30 day review cycle restarting  after being closed since March 16 because of  Covid 19 pandemic. Program opened to patients on July 6. Not all have returned. ITP updated and sent to Medical Director for review,changes as needed and signature  30 Day Review Completed today. Continue with ITP unless changed by Medical Director review.  [name] has been out since 8/17.  He was admitted on 8/20 for sepsis. He will need clearance to return.  unable to meet with pt due to inconsistent attendence  30 Day review. Continue with ITP unless directed changes per Medical Director review.  [name] has been out with illness   Row Name 09/29/18 1113 09/29/18 1122         ITP Comments  Called to check in with pt.  Left message on voice mail (mobile).  [name] returned my call.  He is still slowly regaining his strength.  He has a lot of things going on right now besides his rehab.  He would like to discharge at this time.         Comments: Discharge ITP

## 2018-09-29 NOTE — Telephone Encounter (Signed)
Called to check in with pt.  Left message on voice mail (mobile).

## 2018-10-21 ENCOUNTER — Other Ambulatory Visit: Payer: Self-pay | Admitting: Oncology

## 2018-10-21 ENCOUNTER — Telehealth: Payer: Self-pay | Admitting: *Deleted

## 2018-10-21 DIAGNOSIS — E538 Deficiency of other specified B group vitamins: Secondary | ICD-10-CM

## 2018-10-21 NOTE — Telephone Encounter (Signed)
Left message on voice mail that he no longer needs to take B12 and DR Tasia Catchings will recheck his level at next visit

## 2018-10-21 NOTE — Telephone Encounter (Signed)
Patient called requesting a refill of his B 12, but this was discotinued per medicine list on 06/27/18. Please advise if he is back on it and if it needs to be refilled

## 2018-10-21 NOTE — Telephone Encounter (Signed)
Please let him know that no need to refill vitamin B12. I will recheck his level at next visit. Thanks.

## 2018-11-02 DIAGNOSIS — M720 Palmar fascial fibromatosis [Dupuytren]: Secondary | ICD-10-CM | POA: Insufficient documentation

## 2018-11-18 ENCOUNTER — Ambulatory Visit: Admission: RE | Admit: 2018-11-18 | Payer: Medicare PPO | Source: Ambulatory Visit

## 2018-11-19 ENCOUNTER — Other Ambulatory Visit: Payer: Self-pay | Admitting: Surgery

## 2018-12-01 ENCOUNTER — Encounter
Admission: RE | Admit: 2018-12-01 | Discharge: 2018-12-01 | Disposition: A | Discharge status: Home / Self Care | Payer: Medicare PPO | Source: Ambulatory Visit | Attending: Surgery | Admitting: Surgery

## 2018-12-01 ENCOUNTER — Other Ambulatory Visit: Payer: Self-pay

## 2018-12-01 DIAGNOSIS — Z86718 Personal history of other venous thrombosis and embolism: Secondary | ICD-10-CM | POA: Diagnosis not present

## 2018-12-01 DIAGNOSIS — I2510 Atherosclerotic heart disease of native coronary artery without angina pectoris: Secondary | ICD-10-CM | POA: Diagnosis not present

## 2018-12-01 DIAGNOSIS — I10 Essential (primary) hypertension: Secondary | ICD-10-CM | POA: Diagnosis not present

## 2018-12-01 DIAGNOSIS — F17210 Nicotine dependence, cigarettes, uncomplicated: Secondary | ICD-10-CM | POA: Diagnosis not present

## 2018-12-01 DIAGNOSIS — Z79899 Other long term (current) drug therapy: Secondary | ICD-10-CM | POA: Insufficient documentation

## 2018-12-01 DIAGNOSIS — I4891 Unspecified atrial fibrillation: Secondary | ICD-10-CM | POA: Diagnosis not present

## 2018-12-01 DIAGNOSIS — E119 Type 2 diabetes mellitus without complications: Secondary | ICD-10-CM | POA: Insufficient documentation

## 2018-12-01 DIAGNOSIS — M720 Palmar fascial fibromatosis [Dupuytren]: Secondary | ICD-10-CM | POA: Diagnosis not present

## 2018-12-01 DIAGNOSIS — Z7901 Long term (current) use of anticoagulants: Secondary | ICD-10-CM | POA: Insufficient documentation

## 2018-12-01 DIAGNOSIS — I714 Abdominal aortic aneurysm, without rupture: Secondary | ICD-10-CM | POA: Diagnosis not present

## 2018-12-01 DIAGNOSIS — Z01812 Encounter for preprocedural laboratory examination: Secondary | ICD-10-CM | POA: Diagnosis present

## 2018-12-01 HISTORY — DX: Pneumonia, unspecified organism: J18.9

## 2018-12-01 LAB — CBC
HCT: 43.2 % (ref 39.0–52.0)
Hemoglobin: 14.6 g/dL (ref 13.0–17.0)
MCH: 29.6 pg (ref 26.0–34.0)
MCHC: 33.8 g/dL (ref 30.0–36.0)
MCV: 87.4 fL (ref 80.0–100.0)
Platelets: 149 10*3/uL — ABNORMAL LOW (ref 150–400)
RBC: 4.94 MIL/uL (ref 4.22–5.81)
RDW: 15.1 % (ref 11.5–15.5)
WBC: 4.5 10*3/uL (ref 4.0–10.5)
nRBC: 0 % (ref 0.0–0.2)

## 2018-12-01 LAB — BASIC METABOLIC PANEL
Anion gap: 11 (ref 5–15)
BUN: 17 mg/dL (ref 8–23)
CO2: 24 mmol/L (ref 22–32)
Calcium: 8.9 mg/dL (ref 8.9–10.3)
Chloride: 105 mmol/L (ref 98–111)
Creatinine, Ser: 0.73 mg/dL (ref 0.61–1.24)
GFR calc Af Amer: >60 mL/min (ref >60)
GFR calc non Af Amer: >60 mL/min (ref >60)
Glucose, Bld: 100 mg/dL — ABNORMAL HIGH (ref 70–99)
Potassium: 4.3 mmol/L (ref 3.5–5.1)
Sodium: 140 mmol/L (ref 135–145)

## 2018-12-01 NOTE — Pre-Procedure Instructions (Signed)
  EKG note from ED by Dr Cinda Quest visit August 27, 2018 EKG  EKG read and interpreted by me shows sinus tachycardia rate of 119 normal axis some flattening laterally in the limb leads nonspecific ST-T changes. ____________________________________________

## 2018-12-01 NOTE — Patient Instructions (Addendum)
Your procedure is scheduled on: December 09, 2018 Wednesday  Report to Day Surgery on the 2nd floor of the [facility]. To find out your arrival time, please call [phone] between 1PM - 3PM on: December 08, 2018 Tuesday   REMEMBER: Instructions that are not followed completely may result in serious medical risk, up to and including death; or upon the discretion of your surgeon and anesthesiologist your surgery may need to be rescheduled.  Do not eat food after midnight the night before surgery.  No gum chewing, lozengers or hard candies.  You may however, drink CLEAR liquids up to 3 hours before you are scheduled to arrive for your surgery. Do not drink anything within 2 hours of the start of your surgery.  Clear liquids include: - water  - apple juice without pulp - gatorade - black coffee or tea (Do NOT add milk or creamers to the coffee or tea) Do NOT drink anything that is not on this list.  Type 1 and Type 2 diabetics should only drink water.  ENSURE PRE-SURGERY CARBOHYDRATE DRINK:  Complete drinking 3 hours prior to ARRIVING AT HOSPITAL   No Alcohol for 24 hours before or after surgery.  No Smoking including e-cigarettes for 24 hours prior to surgery.  No chewable tobacco products for at least 6 hours prior to surgery.  No nicotine patches on the day of surgery.  On the morning of surgery brush your teeth with toothpaste and water, you may rinse your mouth with mouthwash if you wish. Do not swallow any toothpaste or mouthwash.  Notify your doctor if there is any change in your medical condition (cold, fever, infection).  Do not wear jewelry, make-up, hairpins, clips or nail polish.  Do not wear lotions, powders, or perfumes.   Do not shave 48 hours prior to surgery.   Contacts and dentures may not be worn into surgery.  Do not bring valuables to the hospital, including drivers license, insurance or credit cards.  [facility] is not responsible for any belongings  or valuables.   TAKE THESE MEDICATIONS THE MORNING OF SURGERY: AMLODIPINE ATORVASTATIN ISOSORBIDE METOPROLOL PROTONIX(take one the night before and one on the morning of surgery - helps to prevent nausea after surgery.)  Use CHG Soap or wipes as directed on instruction sheet.   USE FLONASE AND INHALER DAY OF SURGERY  Follow recommendations from Cardiologist, Pulmonologist or PCP regarding stopping XERALTO- STOP 3 DAYS BEFORE SURGERY AND OFFICE WAS GOING TO CLARIFY WITH DR Nehemiah Massed  Stop Anti-inflammatories (NSAIDS) such as Advil, Aleve, Ibuprofen, Motrin, Naproxen, Naprosyn and Aspirin based products such as Excedrin, Goodys Powder, BC Powder. (May take Tylenol or Acetaminophen if needed.)  Stop ANY OVER THE COUNTER supplements until after surgery. (May continue Vitamin D, Vitamin B, and multivitamin.)  Wear comfortable clothing (specific to your surgery type) to the hospital.  Plan for stool softeners for home use.   If you are being discharged the day of surgery, you will not be allowed to drive home. You will need a responsible adult to drive you home and stay with you that night.   If you are taking public transportation, you will need to have a responsible adult with you. Please confirm with your physician that it is acceptable to use public transportation.   Please call [phone] if you have any questions about these instructions.

## 2018-12-04 ENCOUNTER — Other Ambulatory Visit
Admission: RE | Admit: 2018-12-04 | Discharge: 2018-12-04 | Disposition: A | Discharge status: Home / Self Care | Payer: Medicare PPO | Source: Ambulatory Visit | Attending: Surgery | Admitting: Surgery

## 2018-12-04 ENCOUNTER — Other Ambulatory Visit: Payer: Self-pay

## 2018-12-04 DIAGNOSIS — Z01812 Encounter for preprocedural laboratory examination: Secondary | ICD-10-CM | POA: Insufficient documentation

## 2018-12-04 DIAGNOSIS — Z20828 Contact with and (suspected) exposure to other viral communicable diseases: Secondary | ICD-10-CM | POA: Insufficient documentation

## 2018-12-04 LAB — SARS CORONAVIRUS 2 (TAT 6-24 HRS): SARS Coronavirus 2: NEGATIVE

## 2018-12-07 ENCOUNTER — Other Ambulatory Visit: Payer: Self-pay

## 2018-12-07 ENCOUNTER — Inpatient Hospital Stay: Payer: Medicare PPO

## 2018-12-07 ENCOUNTER — Inpatient Hospital Stay: Payer: Medicare PPO | Attending: Oncology | Admitting: Oncology

## 2018-12-07 ENCOUNTER — Encounter: Payer: Self-pay | Admitting: Oncology

## 2018-12-07 ENCOUNTER — Telehealth: Payer: Self-pay | Admitting: Pharmacist

## 2018-12-07 VITALS — BP 129/85 | HR 83 | Temp 96.5°F | Resp 18 | Wt 208.9 lb

## 2018-12-07 DIAGNOSIS — Z8639 Personal history of other endocrine, nutritional and metabolic disease: Secondary | ICD-10-CM

## 2018-12-07 DIAGNOSIS — R5383 Other fatigue: Secondary | ICD-10-CM | POA: Diagnosis not present

## 2018-12-07 DIAGNOSIS — I252 Old myocardial infarction: Secondary | ICD-10-CM | POA: Insufficient documentation

## 2018-12-07 DIAGNOSIS — Z79899 Other long term (current) drug therapy: Secondary | ICD-10-CM | POA: Insufficient documentation

## 2018-12-07 DIAGNOSIS — G4733 Obstructive sleep apnea (adult) (pediatric): Secondary | ICD-10-CM | POA: Diagnosis not present

## 2018-12-07 DIAGNOSIS — I739 Peripheral vascular disease, unspecified: Secondary | ICD-10-CM | POA: Diagnosis not present

## 2018-12-07 DIAGNOSIS — E538 Deficiency of other specified B group vitamins: Secondary | ICD-10-CM | POA: Diagnosis not present

## 2018-12-07 DIAGNOSIS — N400 Enlarged prostate without lower urinary tract symptoms: Secondary | ICD-10-CM | POA: Insufficient documentation

## 2018-12-07 DIAGNOSIS — K2270 Barrett's esophagus without dysplasia: Secondary | ICD-10-CM | POA: Insufficient documentation

## 2018-12-07 DIAGNOSIS — Z803 Family history of malignant neoplasm of breast: Secondary | ICD-10-CM | POA: Insufficient documentation

## 2018-12-07 DIAGNOSIS — M1990 Unspecified osteoarthritis, unspecified site: Secondary | ICD-10-CM | POA: Diagnosis not present

## 2018-12-07 DIAGNOSIS — E876 Hypokalemia: Secondary | ICD-10-CM

## 2018-12-07 DIAGNOSIS — R634 Abnormal weight loss: Secondary | ICD-10-CM | POA: Diagnosis not present

## 2018-12-07 DIAGNOSIS — Z7901 Long term (current) use of anticoagulants: Secondary | ICD-10-CM | POA: Diagnosis not present

## 2018-12-07 DIAGNOSIS — Z86718 Personal history of other venous thrombosis and embolism: Secondary | ICD-10-CM | POA: Insufficient documentation

## 2018-12-07 DIAGNOSIS — M069 Rheumatoid arthritis, unspecified: Secondary | ICD-10-CM | POA: Insufficient documentation

## 2018-12-07 DIAGNOSIS — R161 Splenomegaly, not elsewhere classified: Secondary | ICD-10-CM | POA: Insufficient documentation

## 2018-12-07 DIAGNOSIS — C9110 Chronic lymphocytic leukemia of B-cell type not having achieved remission: Secondary | ICD-10-CM

## 2018-12-07 DIAGNOSIS — I110 Hypertensive heart disease with heart failure: Secondary | ICD-10-CM | POA: Diagnosis not present

## 2018-12-07 DIAGNOSIS — E785 Hyperlipidemia, unspecified: Secondary | ICD-10-CM | POA: Insufficient documentation

## 2018-12-07 DIAGNOSIS — I2510 Atherosclerotic heart disease of native coronary artery without angina pectoris: Secondary | ICD-10-CM | POA: Diagnosis not present

## 2018-12-07 DIAGNOSIS — D696 Thrombocytopenia, unspecified: Secondary | ICD-10-CM | POA: Diagnosis not present

## 2018-12-07 DIAGNOSIS — K219 Gastro-esophageal reflux disease without esophagitis: Secondary | ICD-10-CM | POA: Diagnosis not present

## 2018-12-07 DIAGNOSIS — F17210 Nicotine dependence, cigarettes, uncomplicated: Secondary | ICD-10-CM | POA: Diagnosis not present

## 2018-12-07 DIAGNOSIS — Z7982 Long term (current) use of aspirin: Secondary | ICD-10-CM | POA: Insufficient documentation

## 2018-12-07 DIAGNOSIS — I714 Abdominal aortic aneurysm, without rupture: Secondary | ICD-10-CM | POA: Insufficient documentation

## 2018-12-07 DIAGNOSIS — I4891 Unspecified atrial fibrillation: Secondary | ICD-10-CM | POA: Diagnosis not present

## 2018-12-07 LAB — CBC WITH DIFFERENTIAL/PLATELET
Abs Immature Granulocytes: 0.01 10*3/uL (ref 0.00–0.07)
Basophils Absolute: 0 10*3/uL (ref 0.0–0.1)
Basophils Relative: 0 %
Eosinophils Absolute: 0.1 10*3/uL (ref 0.0–0.5)
Eosinophils Relative: 1 %
HCT: 42.6 % (ref 39.0–52.0)
Hemoglobin: 13.9 g/dL (ref 13.0–17.0)
Immature Granulocytes: 0 %
Lymphocytes Relative: 22 %
Lymphs Abs: 0.8 10*3/uL (ref 0.7–4.0)
MCH: 29.8 pg (ref 26.0–34.0)
MCHC: 32.6 g/dL (ref 30.0–36.0)
MCV: 91.2 fL (ref 80.0–100.0)
Monocytes Absolute: 0.3 10*3/uL (ref 0.1–1.0)
Monocytes Relative: 9 %
Neutro Abs: 2.4 10*3/uL (ref 1.7–7.7)
Neutrophils Relative %: 68 %
Platelets: 124 10*3/uL — ABNORMAL LOW (ref 150–400)
RBC: 4.67 MIL/uL (ref 4.22–5.81)
RDW: 14.9 % (ref 11.5–15.5)
WBC: 3.6 10*3/uL — ABNORMAL LOW (ref 4.0–10.5)
nRBC: 0 % (ref 0.0–0.2)

## 2018-12-07 LAB — COMPREHENSIVE METABOLIC PANEL
ALT: 21 U/L (ref 0–44)
AST: 29 U/L (ref 15–41)
Albumin: 4.4 g/dL (ref 3.5–5.0)
Alkaline Phosphatase: 98 U/L (ref 38–126)
Anion gap: 9 (ref 5–15)
BUN: 20 mg/dL (ref 8–23)
CO2: 22 mmol/L (ref 22–32)
Calcium: 9.1 mg/dL (ref 8.9–10.3)
Chloride: 105 mmol/L (ref 98–111)
Creatinine, Ser: 0.8 mg/dL (ref 0.61–1.24)
GFR calc Af Amer: >60 mL/min (ref >60)
GFR calc non Af Amer: >60 mL/min (ref >60)
Glucose, Bld: 139 mg/dL — ABNORMAL HIGH (ref 70–99)
Potassium: 3.4 mmol/L — ABNORMAL LOW (ref 3.5–5.1)
Sodium: 136 mmol/L (ref 135–145)
Total Bilirubin: 1.4 mg/dL — ABNORMAL HIGH (ref 0.3–1.2)
Total Protein: 7.5 g/dL (ref 6.5–8.1)

## 2018-12-07 LAB — VITAMIN B12: Vitamin B-12: 646 pg/mL (ref 180–914)

## 2018-12-07 LAB — IMMATURE PLATELET FRACTION: Immature Platelet Fraction: 1.5 % (ref 1.2–8.6)

## 2018-12-07 LAB — LACTATE DEHYDROGENASE: LDH: 130 U/L (ref 98–192)

## 2018-12-07 NOTE — Progress Notes (Signed)
Hematology/Oncology Follow up note [facility] [phone] [phone]   Patient Care Team: [name], MD as PCP - General (Internal Medicine) Earlie Server, MD as Medical Oncologist (Medical Oncology)  REFERRING PROVIDER: [name], MD  REASON FOR VISIT Follow up for management of CLL HISTORY OF PRESENTING ILLNESS:  Martin Adkins is a  73 y.o.  male with PMH listed below who was referred to me for evaluation of lymphocytosis.  Recent lab work on 03/05/2017 showed wbc 11.4, hb 14.6, platelet count 117,000, lymphocytosis 63.5%, neutrophil 22%, Report chronic fatigue, no weight loss, fever or chills.  He reports feeling chest "rattleness". Has for CT chest which showed acute finding, chronic finding includes  postsurgical changes consistent with coronary bypass grafting. One of the bypass grafts arising from the aorta shows apparent thrombosis and an area of distal aneurysmal dilatation which is also Thrombosed. Right adrenal adenoma stable in appearance.Mild scarring in the lung bases.Fatty liver.   Takes Aspirin 73m, coumadine, plavix. He is on anticoagulation for previous history of clots.  Denies any bleeding events. Use to drink hard liquir daily, quitted for 4-5 years. Current drinks wine on weekend.    Image Studies # 04/04/2017  [place]dom[place] showed 1.  Splenomegaly.  No focal splenic lesions evident.2. Increased liver echogenicity, a finding most likely indicative of hepatic steatosis. While no focal liver lesions are evident on this study, it must be cautioned that the sensitivity of ultrasound for detection of focal liver lesions is diminished in this circumstance.3. Pancreas obscured by gas. Portions of inferior vena cava obscured by gas.4.  Gallbladder absent.5. Status post abdominal aortic aneurysm repair. No periaortic fluid.  # 04/21/2017 PET scan 1. Moderate splenomegaly with uniform splenic hypermetabolism,compatible  with the provided history of lymphoproliferative disease.No additional hypermetabolic sites of lymphoproliferative disease. Specifically, no hypermetabolic lymphadenopathy Hepatic steatosis, aortic atherosclerosis. Aneurysm.    # CLL, stage IV disease given spelencemagly, thrombocytopenia, symptomatic with fatigue and weight loss.  CLL IPL score 4, high risk.   # status post abdominal aortic aneurysm repair on 09/25/2017 s/p cardiac cath which revealed patent LIMA to the LAD, no significant disease in the RCA with a 90% stenosis in a small RV marginal branch.  Occluded left circumflex, occluded LAD.  Saphenous vein grafts are all occluded.  RCA does not require grafting.  LAD is well grafted by the left internal mammary.  Medical management  INTERVAL HISTORY Martin JKESLER WICKHAMis a 73y.o. male who has above presents for assessing toxicities from Venetoclax for treatment for CLL.  He has been off venetoclax since 11/11/2017. Patient reports doing well.  Denies any fever, chills, unintentional weight loss, night sweating. Chronic fatigue, at baseline, unchanged.  No easy bruising or bleeding events. Weight has been stable. 208Ib today.  No recent infections or hospitalization.   . Review of Systems  Constitutional: Negative for appetite change, chills, fatigue, fever and unexpected weight change.  HENT:   Negative for hearing loss and voice change.   Eyes: Negative for eye problems and icterus.  Respiratory: Negative for chest tightness, cough, shortness of breath and wheezing.   Cardiovascular: Negative for chest pain and leg swelling.  Gastrointestinal: Negative for abdominal distention and abdominal pain.  Endocrine: Negative for hot flashes.  Genitourinary: Negative for difficulty urinating, dysuria and frequency.   Musculoskeletal: Negative for arthralgias and back pain.  Skin: Negative for itching and rash.  Neurological: Negative for light-headedness and numbness.  Hematological:  Negative for adenopathy.  Does not bruise/bleed easily.  Psychiatric/Behavioral: Negative for confusion.     MEDICAL HISTORY:  Past Medical History:  Diagnosis Date  . AAA (abdominal aortic aneurysm) without rupture ([place]) 04/05/2014  . AAA (abdominal aortic aneurysm) without rupture ([place]) 04/05/2014  . Abdominal aortic aneurysm without rupture ([place])   . Acute non-ST elevation myocardial infarction (NSTEMI) ([place]) 08/15/2014  . 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/28/2014  . APS (antiphospholipid syndrome) ([place])   . Arteriosclerosis of coronary artery 04/05/2014   Overview:  Sp cabg with lima to lad svg to d1 om1 rca 2004 with occluded svg to rca and d1 and pci stetn of om1 graft 2015   . Arthritis   . B12 deficiency 03/21/2017  . Barrett's esophagus   . Benign essential HTN 05/02/2014  . BPH (benign prostatic hyperplasia)   . CAD (coronary artery disease)   . CHF (congestive heart failure) ([place])   . Chronic systolic heart failure ([place]) 04/19/2014   Overview:  With segmental LV systolic dysfunction ejection fraction of 35%   . CLL (chronic lymphocytic leukemia) ([place])   . CLL (chronic lymphocytic leukemia) ([place]) 05/24/2017  . DVT (deep venous thrombosis) ([place])   . Dysrhythmia    ATRIAL FIB.  Marland Kitchen GERD (gastroesophageal reflux disease)   . History of hiatal hernia   . History of shingles 2013  . Hyperlipemia   . Hyperplastic polyps of stomach   . Hypertension   . Myocardial infarction ([place]) 07/30/2014  . NSTEMI (non-ST elevated myocardial infarction) ([place])   . OSA (obstructive sleep apnea)    does not use CPAP  . Osteoarthritis   . Pneumonia   . Pre-diabetes   . PVD (peripheral vascular disease) ([place])   . RA (rheumatoid arthritis) ([place])   . SOB (shortness of breath)     SURGICAL HISTORY: Past Surgical History:  Procedure Laterality Date  . ABDOMINAL AORTA STENT    . CHOLECYSTECTOMY    . COLONOSCOPY  11/24/2009  . CORONARY ANGIOPLASTY WITH STENT PLACEMENT  2015  . CORONARY ARTERY  BYPASS GRAFT    . ENDOVASCULAR REPAIR/STENT GRAFT N/A 09/24/2017   Procedure: ENDOVASCULAR REPAIR/STENT GRAFT;  Surgeon: Algernon Huxley, MD;  Location: [facility];  Service: Cardiovascular;  Laterality: N/A;  . ESOPHAGOGASTRODUODENOSCOPY  05/26/02  03/23/12  . ESOPHAGOGASTRODUODENOSCOPY (EGD) WITH PROPOFOL N/A 10/28/2016   Procedure: ESOPHAGOGASTRODUODENOSCOPY (EGD) WITH PROPOFOL;  Surgeon: Manya Silvas, MD;  Location: [facility] ENDOSCOPY;  Service: Endoscopy;  Laterality: N/A;  . HERNIA REPAIR Left    inguinal  . LEFT HEART CATH AND CORONARY ANGIOGRAPHY N/A 01/19/2018   Procedure: LEFT HEART CATH AND CORONARY ANGIOGRAPHY;  Surgeon: Teodoro Spray, MD;  Location: [facility];  Service: Cardiovascular;  Laterality: N/A;  . PERIPHERAL VASCULAR CATHETERIZATION N/A 09/06/2014   Procedure: IVC Filter Removal;  Surgeon: Katha Cabal, MD;  Location: [facility];  Service: Cardiovascular;  Laterality: N/A;  . TRANSURETHRAL RESECTION OF PROSTATE N/A 02/20/2015   Procedure: TRANSURETHRAL RESECTION OF THE PROSTATE (TURP);  Surgeon: Hollice Espy, MD;  Location: [facility] ORS;  Service: Urology;  Laterality: N/A;    SOCIAL HISTORY: Social History   Socioeconomic History  . Marital status: Legally Separated    Spouse name: Not on file  . Number of children: Not on file  . Years of education: Not on file  . Highest education level: Not on file  Occupational History  . Not on file  Social Needs  . Financial resource strain: Not hard at all  .  Food insecurity    Worry: Never true    Inability: Never true  . Transportation needs    Medical: No    Non-medical: No  Tobacco Use  . Smoking status: Current Some Day Smoker    Packs/day: 0.50    Years: 20.00    Pack years: 10.00    Types: Cigarettes  . Smokeless tobacco: Never Used  . Tobacco comment: sometimes 2-3 a day  Substance and Sexual Activity  . Alcohol use: Yes    Alcohol/week: 1.0 - 2.0 standard drinks    Types:  1 - 2 Shots of liquor per week  . Drug use: No  . Sexual activity: Never  Lifestyle  . Physical activity    Days per week: 7 days    Minutes per session: 40 min  . Stress: Not at all  Relationships  . Social connections    Talks on phone: More than three times a week    Gets together: More than three times a week    Attends religious service: More than 4 times per year    Active member of club or organization: No    Attends meetings of clubs or organizations: Never    Relationship status: Separated  . Intimate partner violence    Fear of current or ex partner: No    Emotionally abused: No    Physically abused: No    Forced sexual activity: No  Other Topics Concern  . Not on file  Social History Narrative  . Not on file    FAMILY HISTORY: Family History  Problem Relation Age of Onset  . Cancer Mother 27       breast ca  . Diabetes Mother   . Coronary artery disease Father   . Heart attack Father   . Prostate cancer Brother   . Bladder Cancer Neg Hx   . Kidney cancer Neg Hx     ALLERGIES:  is allergic to ace inhibitors; pravastatin; rosuvastatin; simvastatin; amoxicillin; niacin; and penicillins.  MEDICATIONS:  Current Outpatient Medications  Medication Sig Dispense Refill  . albuterol (PROVENTIL HFA;VENTOLIN HFA) 108 (90 Base) MCG/ACT inhaler Inhale 2 puffs into the lungs every 6 (six) hours as needed for wheezing or shortness of breath. 1 Inhaler 0  . amLODipine (NORVASC) 10 MG tablet Take 10 mg by mouth daily.     Marland Kitchen atorvastatin (LIPITOR) 80 MG tablet Take 80 mg by mouth daily.   11  . dorzolamide (TRUSOPT) 2 % ophthalmic solution Place 1 drop into both eyes 2 (two) times daily.     . fluticasone (FLONASE) 50 MCG/ACT nasal spray Place 2 sprays into both nostrils daily.     Marland Kitchen gabapentin (NEURONTIN) 300 MG capsule Take 1 capsule (300 mg total) by mouth 3 (three) times daily. (Patient taking differently: Take 300 mg by mouth 2 (two) times daily. ) 90 capsule 0  .  Ipratropium-Albuterol (COMBIVENT) 20-100 MCG/ACT AERS respimat Inhale 1 puff into the lungs every 6 (six) hours. 1 Inhaler 11  . isosorbide mononitrate (IMDUR) 60 MG 24 hr tablet Take 1 tablet (60 mg total) by mouth daily. 30 tablet 0  . latanoprost (XALATAN) 0.005 % ophthalmic solution Place 1 drop into both eyes at bedtime.     . metoprolol succinate (TOPROL-XL) 100 MG 24 hr tablet Take 100 mg by mouth daily.     . pantoprazole (PROTONIX) 40 MG tablet Take 40 mg by mouth daily.     . potassium chloride SA (K-DUR,KLOR-CON) 20 MEQ tablet  Take 20 mEq by mouth daily.     . rivaroxaban (XARELTO) 20 MG TABS tablet Take 1 tablet (20 mg total) by mouth daily with supper. 30 tablet 3   No current facility-administered medications for this visit.      PHYSICAL EXAMINATION: ECOG 1 Vitals:   12/07/18 1019  BP: 129/85  Pulse: 83  Resp: 18  Temp: (!) 96.5 F (35.8 C)  Physical Exam  Constitutional: He is oriented to person, place, and time. No distress.  HENT:  Head: Normocephalic and atraumatic.  Nose: Nose normal.  Mouth/Throat: Oropharynx is clear and moist. No oropharyngeal exudate.  Eyes: Pupils are equal, round, and reactive to light. EOM are normal. Left eye exhibits no discharge. No scleral icterus.  Neck: Normal range of motion. Neck supple. No JVD present.  Cardiovascular: Normal rate, regular rhythm and normal heart sounds.  No murmur heard. Pulmonary/Chest: Effort normal. No respiratory distress. He has no wheezes. He has no rales. He exhibits no tenderness.  Abdominal: Soft. He exhibits no distension. There is no abdominal tenderness.  Musculoskeletal: Normal range of motion.        General: No deformity or edema.  Lymphadenopathy:    He has no cervical adenopathy.  Neurological: He is alert and oriented to person, place, and time. No cranial nerve deficit. He exhibits normal muscle tone. Coordination normal.  Skin: Skin is warm and dry. He is not diaphoretic. No erythema.   Psychiatric: Affect normal.  Nursing note and vitals reviewed.    LABORATORY DATA:  I have reviewed the data as listed Lab Results  Component Value Date   WBC 3.6 (L) 12/07/2018   HGB 13.9 12/07/2018   HCT 42.6 12/07/2018   MCV 91.2 12/07/2018   PLT 124 (L) 12/07/2018   Recent Labs    08/06/18 1010 08/27/18 1125  08/29/18 0406 12/01/18 1140 12/07/18 0930  NA 137 132*   < > 136 140 136  K 4.1 3.2*   < > 3.4* 4.3 3.4*  CL 107 98   < > 104 105 105  CO2 22 20*   < > '23 24 22  ' GLUCOSE 119* 136*   < > 111* 100* 139*  BUN 17 17   < > '15 17 20  ' CREATININE 1.00 1.27*   < > 0.89 0.73 0.80  CALCIUM 8.9 8.5*   < > 7.7* 8.9 9.1  GFRNONAA >60 57*   < > >60 >60 >60  GFRAA >60 >60   < > >60 >60 >60  PROT 7.3 7.7  --   --   --  7.5  ALBUMIN 4.1 3.4*  --   --   --  4.4  AST 30 30  --   --   --  29  ALT 25 16  --   --   --  21  ALKPHOS 92 136*  --   --   --  98  BILITOT 1.4* 4.0*  --   --   --  1.4*   < > = values in this interval not displayed.   Hepatitis panel negative.  LDH 228, mildly elevated Beta 2 microglobulin normal.   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 The morphologic and immunophenotypic features are most consistent with chronic lymphocytic leukemia/small lymphocytic lymphoma. Bone marrow Cytogenetics : normal.  Peripheral blood FISH panel negative for CCND1- IGH mutation, ATM, 12, 13q, Tp53 mutation.   RADIOGRAPHIC STUDIES: I have personally reviewed the radiological  images as listed and agreed with the findings in the report. CT angio abd/pelvis showed Type 1b endoleak, AAA aneurysm, Spleen has decreased in size since PET scan in April 2019.   ASSESSMENT & PLAN:  73 y.o. male presents for fol low-up CLL. Clinically doing well.  1. CLL (chronic lymphocytic leukemia) ([place])   2. Thrombocytopenia ([place])   3. Long term current use of anticoagulant therapy   4. Hypokalemia   5. History of non anemic vitamin B12  deficiency    #CLL, stable.  Patient has been in remission.  Off treatment since November 2019. 06/30/2018 Peripheral blood flow cytometry negative for immunophenotypic abnormality.  Labs are reviewed and discussed with patient. Continue observation.   Chronic anticoagulation for A fib. On Xarelto. Tolerating well. Continue follow up with cardiology.   Thrombocytopenia, stable.  History of B12 deficiency. B12 level is 646. Repeat in 6 months. Hypokalemia, potassium 3.4. advise patient to take potassium enriched food.   Repeat labs 4 months and MD assessment.  Earlie Server, MD, PhD Hematology Oncology [facility] at [facility] Pager- [phone] 12/07/2018

## 2018-12-07 NOTE — Telephone Encounter (Signed)
Oral Chemotherapy Pharmacist Encounter  Dispensed samples to patient:  Medication: Xarelto 20 mg Instructions: Take 1 tablet (20 mg total) by mouth daily with supper. Quantity dispensed: 21 Days supply: 21 Manufacturer: [employer] Lot: [id number] Exp: 05/07/19  Darl Pikes, PharmD, BCPS, [facility] Hematology/Oncology Clinical Pharmacist [facility] [phone]  12/07/2018 10:33 AM

## 2018-12-07 NOTE — Progress Notes (Signed)
Patient does not offer any problems today.  

## 2018-12-09 ENCOUNTER — Encounter
Admission: RE | Disposition: A | Discharge status: Home / Self Care | Payer: Self-pay | Source: Home / Self Care | Attending: Surgery

## 2018-12-09 ENCOUNTER — Encounter: Payer: Self-pay | Admitting: *Deleted

## 2018-12-09 ENCOUNTER — Ambulatory Visit: Payer: Medicare PPO | Admitting: Certified Registered"

## 2018-12-09 ENCOUNTER — Ambulatory Visit
Admission: RE | Admit: 2018-12-09 | Discharge: 2018-12-09 | Disposition: A | Discharge status: Home / Self Care | Payer: Medicare PPO | Attending: Surgery | Admitting: Surgery

## 2018-12-09 ENCOUNTER — Other Ambulatory Visit: Payer: Self-pay

## 2018-12-09 DIAGNOSIS — I4891 Unspecified atrial fibrillation: Secondary | ICD-10-CM | POA: Diagnosis not present

## 2018-12-09 DIAGNOSIS — I714 Abdominal aortic aneurysm, without rupture: Secondary | ICD-10-CM | POA: Insufficient documentation

## 2018-12-09 DIAGNOSIS — Z86718 Personal history of other venous thrombosis and embolism: Secondary | ICD-10-CM | POA: Insufficient documentation

## 2018-12-09 DIAGNOSIS — Z7951 Long term (current) use of inhaled steroids: Secondary | ICD-10-CM | POA: Insufficient documentation

## 2018-12-09 DIAGNOSIS — G4730 Sleep apnea, unspecified: Secondary | ICD-10-CM | POA: Diagnosis not present

## 2018-12-09 DIAGNOSIS — I252 Old myocardial infarction: Secondary | ICD-10-CM | POA: Insufficient documentation

## 2018-12-09 DIAGNOSIS — F17200 Nicotine dependence, unspecified, uncomplicated: Secondary | ICD-10-CM | POA: Insufficient documentation

## 2018-12-09 DIAGNOSIS — I5022 Chronic systolic (congestive) heart failure: Secondary | ICD-10-CM | POA: Diagnosis not present

## 2018-12-09 DIAGNOSIS — K219 Gastro-esophageal reflux disease without esophagitis: Secondary | ICD-10-CM | POA: Insufficient documentation

## 2018-12-09 DIAGNOSIS — G4733 Obstructive sleep apnea (adult) (pediatric): Secondary | ICD-10-CM | POA: Diagnosis not present

## 2018-12-09 DIAGNOSIS — K449 Diaphragmatic hernia without obstruction or gangrene: Secondary | ICD-10-CM | POA: Diagnosis not present

## 2018-12-09 DIAGNOSIS — Z79899 Other long term (current) drug therapy: Secondary | ICD-10-CM | POA: Insufficient documentation

## 2018-12-09 DIAGNOSIS — Z955 Presence of coronary angioplasty implant and graft: Secondary | ICD-10-CM | POA: Diagnosis not present

## 2018-12-09 DIAGNOSIS — Z9049 Acquired absence of other specified parts of digestive tract: Secondary | ICD-10-CM | POA: Diagnosis not present

## 2018-12-09 DIAGNOSIS — D696 Thrombocytopenia, unspecified: Secondary | ICD-10-CM | POA: Insufficient documentation

## 2018-12-09 DIAGNOSIS — I110 Hypertensive heart disease with heart failure: Secondary | ICD-10-CM | POA: Insufficient documentation

## 2018-12-09 DIAGNOSIS — M720 Palmar fascial fibromatosis [Dupuytren]: Secondary | ICD-10-CM | POA: Insufficient documentation

## 2018-12-09 DIAGNOSIS — Z951 Presence of aortocoronary bypass graft: Secondary | ICD-10-CM | POA: Insufficient documentation

## 2018-12-09 DIAGNOSIS — I739 Peripheral vascular disease, unspecified: Secondary | ICD-10-CM | POA: Diagnosis not present

## 2018-12-09 DIAGNOSIS — Z8249 Family history of ischemic heart disease and other diseases of the circulatory system: Secondary | ICD-10-CM | POA: Insufficient documentation

## 2018-12-09 DIAGNOSIS — E7800 Pure hypercholesterolemia, unspecified: Secondary | ICD-10-CM | POA: Diagnosis not present

## 2018-12-09 DIAGNOSIS — M069 Rheumatoid arthritis, unspecified: Secondary | ICD-10-CM | POA: Diagnosis not present

## 2018-12-09 DIAGNOSIS — M1990 Unspecified osteoarthritis, unspecified site: Secondary | ICD-10-CM | POA: Insufficient documentation

## 2018-12-09 DIAGNOSIS — D6861 Antiphospholipid syndrome: Secondary | ICD-10-CM | POA: Diagnosis not present

## 2018-12-09 DIAGNOSIS — I2510 Atherosclerotic heart disease of native coronary artery without angina pectoris: Secondary | ICD-10-CM | POA: Diagnosis not present

## 2018-12-09 DIAGNOSIS — C9110 Chronic lymphocytic leukemia of B-cell type not having achieved remission: Secondary | ICD-10-CM | POA: Diagnosis not present

## 2018-12-09 DIAGNOSIS — Z881 Allergy status to other antibiotic agents status: Secondary | ICD-10-CM | POA: Insufficient documentation

## 2018-12-09 DIAGNOSIS — Z880 Allergy status to penicillin: Secondary | ICD-10-CM | POA: Insufficient documentation

## 2018-12-09 DIAGNOSIS — Z833 Family history of diabetes mellitus: Secondary | ICD-10-CM | POA: Insufficient documentation

## 2018-12-09 DIAGNOSIS — Z888 Allergy status to other drugs, medicaments and biological substances status: Secondary | ICD-10-CM | POA: Insufficient documentation

## 2018-12-09 DIAGNOSIS — E119 Type 2 diabetes mellitus without complications: Secondary | ICD-10-CM | POA: Insufficient documentation

## 2018-12-09 HISTORY — PX: DUPUYTREN CONTRACTURE RELEASE: SHX1478

## 2018-12-09 SURGERY — RELEASE, DUPUYTREN CONTRACTURE
Anesthesia: General | Laterality: Left

## 2018-12-09 MED ORDER — CLINDAMYCIN PHOSPHATE 900 MG/50ML IV SOLN
INTRAVENOUS | Status: AC
  Filled 2018-12-09: qty 50

## 2018-12-09 MED ORDER — MEPERIDINE HCL 50 MG/ML IJ SOLN: 6.2500 mg | INTRAMUSCULAR | Status: DC | PRN

## 2018-12-09 MED ORDER — PROMETHAZINE HCL 25 MG/ML IJ SOLN: 6.2500 mg | INTRAMUSCULAR | Status: DC | PRN

## 2018-12-09 MED ORDER — FENTANYL CITRATE (PF) 100 MCG/2ML IJ SOLN
INTRAMUSCULAR | Status: AC
  Filled 2018-12-09: qty 2

## 2018-12-09 MED ORDER — BUPIVACAINE HCL (PF) 0.5 % IJ SOLN
INTRAMUSCULAR | Status: AC
  Filled 2018-12-09: qty 30

## 2018-12-09 MED ORDER — EPHEDRINE SULFATE 50 MG/ML IJ SOLN
INTRAMUSCULAR | Status: DC | PRN
  Administered 2018-12-09: 5 mg via INTRAVENOUS

## 2018-12-09 MED ORDER — FENTANYL CITRATE (PF) 100 MCG/2ML IJ SOLN: 25.0000 ug | INTRAMUSCULAR | Status: DC | PRN

## 2018-12-09 MED ORDER — IPRATROPIUM-ALBUTEROL 0.5-2.5 (3) MG/3ML IN SOLN
3.0000 mL | Freq: Once | RESPIRATORY_TRACT | Status: AC
  Administered 2018-12-09: 13:00:00 3 mL via RESPIRATORY_TRACT

## 2018-12-09 MED ORDER — LIDOCAINE HCL (CARDIAC) PF 100 MG/5ML IV SOSY
PREFILLED_SYRINGE | INTRAVENOUS | Status: DC | PRN
  Administered 2018-12-09: 80 mg via INTRAVENOUS

## 2018-12-09 MED ORDER — OXYCODONE HCL 5 MG PO TABS: 5.0000 mg | ORAL_TABLET | Freq: Once | ORAL | Status: DC | PRN

## 2018-12-09 MED ORDER — PHENYLEPHRINE HCL (PRESSORS) 10 MG/ML IV SOLN
INTRAVENOUS | Status: DC | PRN
  Administered 2018-12-09: 150 ug via INTRAVENOUS
  Administered 2018-12-09: 100 ug via INTRAVENOUS

## 2018-12-09 MED ORDER — DEXAMETHASONE SODIUM PHOSPHATE 10 MG/ML IJ SOLN
INTRAMUSCULAR | Status: DC | PRN
  Administered 2018-12-09: 5 mg via INTRAVENOUS

## 2018-12-09 MED ORDER — PROPOFOL 10 MG/ML IV BOLUS
INTRAVENOUS | Status: DC | PRN
  Administered 2018-12-09: 150 mg via INTRAVENOUS

## 2018-12-09 MED ORDER — CLINDAMYCIN PHOSPHATE 900 MG/50ML IV SOLN
900.0000 mg | INTRAVENOUS | Status: AC
  Administered 2018-12-09: 900 mg via INTRAVENOUS

## 2018-12-09 MED ORDER — IPRATROPIUM-ALBUTEROL 0.5-2.5 (3) MG/3ML IN SOLN
RESPIRATORY_TRACT | Status: AC
  Administered 2018-12-09: 3 mL via RESPIRATORY_TRACT
  Filled 2018-12-09: qty 3

## 2018-12-09 MED ORDER — CHLORHEXIDINE GLUCONATE 4 % EX LIQD: 60.0000 mL | Freq: Once | CUTANEOUS | Status: DC

## 2018-12-09 MED ORDER — BUPIVACAINE HCL (PF) 0.5 % IJ SOLN
INTRAMUSCULAR | Status: DC | PRN
  Administered 2018-12-09: 10 mL

## 2018-12-09 MED ORDER — PROPOFOL 10 MG/ML IV BOLUS
INTRAVENOUS | Status: AC
  Filled 2018-12-09: qty 20

## 2018-12-09 MED ORDER — LIDOCAINE HCL URETHRAL/MUCOSAL 2 % EX GEL
CUTANEOUS | Status: AC
  Filled 2018-12-09: qty 5

## 2018-12-09 MED ORDER — FENTANYL CITRATE (PF) 100 MCG/2ML IJ SOLN
INTRAMUSCULAR | Status: DC | PRN
  Administered 2018-12-09 (×7): 25 ug via INTRAVENOUS

## 2018-12-09 MED ORDER — HYDROCODONE-ACETAMINOPHEN 5-325 MG PO TABS: 1.0000 | ORAL_TABLET | Freq: Four times a day (QID) | ORAL | 0 refills | Status: DC | PRN

## 2018-12-09 MED ORDER — LACTATED RINGERS IV SOLN
INTRAVENOUS | Status: DC
  Administered 2018-12-09: 09:00:00 via INTRAVENOUS

## 2018-12-09 MED ORDER — ONDANSETRON HCL 4 MG/2ML IJ SOLN
INTRAMUSCULAR | Status: DC | PRN
  Administered 2018-12-09: 4 mg via INTRAVENOUS

## 2018-12-09 MED ORDER — OXYCODONE HCL 5 MG/5ML PO SOLN: 5.0000 mg | Freq: Once | ORAL | Status: DC | PRN

## 2018-12-09 SURGICAL SUPPLY — 37 items
BLADE SURG 15 STRL LF DISP TIS (BLADE) ×1 IMPLANT
BLADE SURG 15 STRL SS (BLADE) ×1
BNDG COHESIVE 4X5 TAN STRL (GAUZE/BANDAGES/DRESSINGS) ×1 IMPLANT
BNDG CONFORM 2 STRL LF (GAUZE/BANDAGES/DRESSINGS) ×2 IMPLANT
BNDG ELASTIC 2X5.8 VLCR STR LF (GAUZE/BANDAGES/DRESSINGS) ×2 IMPLANT
BNDG ELASTIC 3X5.8 VLCR STR LF (GAUZE/BANDAGES/DRESSINGS) ×2 IMPLANT
BNDG ESMARK 4X12 TAN STRL LF (GAUZE/BANDAGES/DRESSINGS) ×2 IMPLANT
CHLORAPREP W/TINT 26 (MISCELLANEOUS) ×2 IMPLANT
CORD BIP STRL DISP 12FT (MISCELLANEOUS) ×2 IMPLANT
COVER WAND RF STERILE (DRAPES) ×2 IMPLANT
CUFF TOURN SGL QUICK 18X4 (TOURNIQUET CUFF) ×2 IMPLANT
DRAPE SPLIT 6X30 W/TAPE (DRAPES) ×2 IMPLANT
DRAPE SURG 17X11 SM STRL (DRAPES) ×2 IMPLANT
ELECT REM PT RETURN 9FT ADLT (ELECTROSURGICAL) ×2
ELECTRODE REM PT RTRN 9FT ADLT (ELECTROSURGICAL) ×1 IMPLANT
FORCEPS JEWEL BIP 4-3/4 STR (INSTRUMENTS) ×2 IMPLANT
GAUZE SPONGE 4X4 12PLY STRL (GAUZE/BANDAGES/DRESSINGS) ×2 IMPLANT
GAUZE XEROFORM 1X8 LF (GAUZE/BANDAGES/DRESSINGS) ×2 IMPLANT
GLOVE BIO SURGEON STRL SZ8 (GLOVE) ×4 IMPLANT
GLOVE INDICATOR 8.0 STRL GRN (GLOVE) ×2 IMPLANT
GOWN STRL REUS W/ TWL LRG LVL3 (GOWN DISPOSABLE) ×1 IMPLANT
GOWN STRL REUS W/ TWL XL LVL3 (GOWN DISPOSABLE) ×1 IMPLANT
GOWN STRL REUS W/TWL LRG LVL3 (GOWN DISPOSABLE) ×1
GOWN STRL REUS W/TWL XL LVL3 (GOWN DISPOSABLE) ×1
KIT TURNOVER KIT A (KITS) ×2 IMPLANT
NS IRRIG 1000ML POUR BTL (IV SOLUTION) ×2 IMPLANT
NS IRRIG 500ML POUR BTL (IV SOLUTION) ×2 IMPLANT
PACK EXTREMITY ARMC (MISCELLANEOUS) ×2 IMPLANT
PAD PREP 24X41 OB/GYN DISP (PERSONAL CARE ITEMS) ×2 IMPLANT
SPLINT CAST 1 STEP 3X12 (MISCELLANEOUS) ×1 IMPLANT
SPONGE GAUZE 2X2 8PLY STRL LF (GAUZE/BANDAGES/DRESSINGS) ×2 IMPLANT
STOCKINETTE 48X4 2 PLY STRL (GAUZE/BANDAGES/DRESSINGS) ×1 IMPLANT
STOCKINETTE IMPERVIOUS 9X36 MD (GAUZE/BANDAGES/DRESSINGS) ×2 IMPLANT
STOCKINETTE STRL 4IN 9604848 (GAUZE/BANDAGES/DRESSINGS) ×2 IMPLANT
SUT PROLENE 4 0 PS 2 18 (SUTURE) ×5 IMPLANT
SUT VIC AB 3-0 SH 27 (SUTURE) ×1
SUT VIC AB 3-0 SH 27X BRD (SUTURE) ×1 IMPLANT

## 2018-12-09 NOTE — Discharge Instructions (Addendum)
AMBULATORY SURGERY  DISCHARGE INSTRUCTIONS   1) The drugs that you were given will stay in your system until tomorrow so for the next 24 hours you should not:  A) Drive an automobile B) Make any legal decisions C) Drink any alcoholic beverage   2) You may resume regular meals tomorrow.  Today it is better to start with liquids and gradually work up to solid foods.  You may eat anything you prefer, but it is better to start with liquids, then soup and crackers, and gradually work up to solid foods.   3) Please notify your doctor immediately if you have any unusual bleeding, trouble breathing, redness and pain at the surgery site, drainage, fever, or pain not relieved by medication.    4) Additional Instructions:        Please contact your physician with any problems or Same Day Surgery at [phone], Monday through Friday 6 am to 4 pm, or [place] at [facility] number at [phone].Orthopedic discharge instructions: Keep splint dry and intact. Keep hand elevated above heart level. Apply ice to affected area frequently. Take ibuprofen 600-800 mg TID with meals for 7-10 days, then as necessary. Take pain medication as prescribed or ES Tylenol when needed.  Return for follow-up in 10-14 days or as scheduled.

## 2018-12-09 NOTE — H&P (Signed)
Paper H&P to be scanned into permanent record. H&P reviewed and patient re-examined. No changes. 

## 2018-12-09 NOTE — Transfer of Care (Signed)
Immediate Anesthesia Transfer of Care Note  Patient: Martin Adkins  Procedure(s) Performed: DUPUYTREN CONTRACTURE RELEASE LEFT LONG FINGER (Left )  Patient Location: PACU  Anesthesia Type:General  Level of Consciousness: awake and alert   Airway & Oxygen Therapy: Patient Spontanous Breathing and Patient connected to face mask oxygen  Post-op Assessment: Report given to RN and Post -op Vital signs reviewed and stable  Post vital signs: Reviewed and stable  Last Vitals:  Vitals Value Taken Time  BP    Temp    Pulse 66 12/09/18 1209  Resp 22 12/09/18 1210  SpO2 97 % 12/09/18 1209  Vitals shown include unvalidated device data.  Last Pain:  Vitals:   12/09/18 0918  TempSrc: Tympanic  PainSc: 0-No pain      Patients Stated Pain Goal: 0 ([id number])  Complications: No apparent anesthesia complications

## 2018-12-09 NOTE — Anesthesia Preprocedure Evaluation (Signed)
Anesthesia Evaluation  Patient identified by MRN, date of birth, ID band Patient awake    Reviewed: Allergy & Precautions, NPO status , Patient's Chart, lab work & pertinent test results  History of Anesthesia Complications Negative for: history of anesthetic complications  Airway Mallampati: III  TM Distance: >3 FB Neck ROM: Full    Dental  (+) Poor Dentition, Missing   Pulmonary sleep apnea , neg COPD, Current Smoker and Patient abstained from smoking.,    breath sounds clear to auscultation- rhonchi (-) wheezing      Cardiovascular hypertension, Pt. on medications + CAD, + Past MI, + Cardiac Stents, + Peripheral Vascular Disease (s/p AAA stent 2019) and +CHF   Rhythm:Regular Rate:Normal - Systolic murmurs and - Diastolic murmurs L heart cath 01/19/18: 90% first diagonal.  RCA had an acute marginal with a 99% stenosis.  RCA had no significant disease.  Vein graft to RCA is chronically occluded.  Vein graft to diagonal is chronically occluded.  Vein graft to OM has a stent with proximal occlusion.  LIMA to the LAD is widely patent.  Revealed films suggest medical management.  Not amenable to PCI or further coronary bypass grafting.  EF 35 to 45% globally.  Cardiac cath completed with no immediate complications.  Patient has occluded proximal circumflex, occluded proximal to mid LAD.   Neuro/Psych neg Seizures negative neurological ROS  negative psych ROS   GI/Hepatic Neg liver ROS, hiatal hernia, GERD  ,  Endo/Other  diabetes  Renal/GU negative Renal ROS     Musculoskeletal  (+) Arthritis ,   Abdominal (+) - obese,   Peds  Hematology   Anesthesia Other Findings Past Medical History: 04/05/2014: AAA (abdominal aortic aneurysm) without rupture ([place]) 04/05/2014: AAA (abdominal aortic aneurysm) without rupture (H[facility]) No date: Abdominal aortic aneurysm without rupture ([place]) 08/15/2014: Acute non-ST elevation myocardial  infarction (NSTEMI) ([place]) 07/28/2014: Antepartum deep phlebothrombosis No date: APS (antiphospholipid syndrome) ([place]) 04/05/2014: Arteriosclerosis of coronary artery     Comment:  Overview:  Sp cabg with lima to lad svg to d1 om1 rca               2004 with occluded svg to rca and d1 and pci stetn of om1              graft 2015  No date: Arthritis 03/21/2017: B12 deficiency No date: Barrett's esophagus 05/02/2014: Benign essential HTN No date: BPH (benign prostatic hyperplasia) No date: CAD (coronary artery disease) No date: CHF (congestive heart failure) ([place]) [id number]: Chronic systolic heart failure ([place])     Comment:  Overview:  With segmental LV systolic dysfunction               ejection fraction of 35%  No date: CLL (chronic lymphocytic leukemia) ([place]) 05/24/2017: CLL (chronic lymphocytic leukemia) (HCC) No date: DVT (deep venous thrombosis) (HCC) No date: Dysrhythmia     Comment:  ATRIAL FIB. No date: GERD (gastroesophageal reflux disease) No date: History of hiatal hernia 2013: History of shingles No date: Hyperlipemia No date: Hyperplastic polyps of stomach No date: Hypertension 07/30/2014: Myocardial infarction ([place]) No date: NSTEMI (non-ST elevated myocardial infarction) ([place]) No date: OSA (obstructive sleep apnea)     Comment:  does not use CPAP No date: Osteoarthritis No date: Pneumonia No date: Pre-diabetes No date: PVD (peripheral vascular disease) (HCC) No date: RA (rheumatoid arthritis) (HCC) No date: SOB (shortness of breath)   Reproductive/Obstetrics  Anesthesia Physical Anesthesia Plan  ASA: III  Anesthesia Plan: General   Post-op Pain Management:    Induction: Intravenous  PONV Risk Score and Plan: 0 and Ondansetron  Airway Management Planned: LMA  Additional Equipment:   Intra-op Plan:   Post-operative Plan:   Informed Consent: I have reviewed the patients History and Physical, chart,  labs and discussed the procedure including the risks, benefits and alternatives for the proposed anesthesia with the patient or authorized representative who has indicated his/her understanding and acceptance.     Dental advisory given  Plan Discussed with: CRNA and Anesthesiologist  Anesthesia Plan Comments:         Anesthesia Quick Evaluation

## 2018-12-09 NOTE — Op Note (Addendum)
12/09/2018  12:04 PM  Patient:   Martin Adkins  Pre-Op Diagnosis:   Dupuytren's contracture, left long and little fingers.  Post-Op Diagnosis:   Same.  Procedure:   Release of Dupuytren's contracture, left long finger.  Surgeon:   Pascal Lux, MD  Assistant:   Larrie Kass, PA-S  Anesthesia:   General LMA  Findings:   As above.  Complications:   None  EBL:   0 cc  Fluids:   800 cc crystalloid  TT:   95 minutes at 250 mmHg  Drains:   None  Closure:   4-0 Prolene interrupted sutures  Brief Clinical Note:   The patient is a 73 year old male with a long history of a gradually worsening contracture of his left long finger. The patient's history and examination are consistent with a Dupuytren's contracture of the left long finger. Examination also suggested a small band extending over to the left little MCP crease. The patient presents at this time for release of the Dupuytren's contracture of the left long finger, as well as possibly the left little finger.  Procedure:   The patient was brought into the operating room and lain in the supine position. After adequate general laryngeal mask anesthesia was achieved, the left hand and upper extremity were prepped with ChloraPrep solution before being draped sterilely. Preoperative antibiotics were administered. After performing a timeout to verify the appropriate surgical site, a Erick Blinks type zigzag incision was made along the volar aspect of the left long finger beginning just proximal to the proximal palmar crease and extending to the DIP flexion crease. The incision was carried down through subcutaneous tissues. The fibrous cord was identified and carefully dissected out from proximal to distal after releasing it proximally. As dissection was carried out, care was taken to identify and protect the common digital nerve and artery on either side of the cord, as well as the underlying flexor tendon, proximally. More distally, the  digital neurovascular bundles were identified and protected. After the mass was removed in its entirety, the adequacy of excision was verified by palpation as well as visually. After excision of the Dupuytren's tissue, the left long finger MCP and PIP joints could be extended fully.  In addition, it was noted that release of the cord extending to the left little MCP joint during the excision of the Dupuytren's tissue of the left long finger completely alleviated this issue so further dissection was not performed on the left long finger.  The wound was copiously irrigated with sterile saline solution before the skin was reapproximated using 4-0 Prolene interrupted sutures. A total of 10 cc of 0.5% plain Sensorcaine was injected in and around the incision to help with postoperative analgesia before a sterile bulky dressing and volar splint extending to the fingertips was applied, maintaining the MCP joints in extension. The patient was then awakened and returned to the recovery room in satisfactory condition after tolerating the procedure well.

## 2018-12-09 NOTE — Anesthesia Post-op Follow-up Note (Signed)
Anesthesia QCDR form completed.        

## 2018-12-09 NOTE — Anesthesia Postprocedure Evaluation (Signed)
Anesthesia Post Note  Patient: Martin Adkins  Procedure(s) Performed: DUPUYTREN CONTRACTURE RELEASE LEFT LONG FINGER (Left )  Patient location during evaluation: PACU Anesthesia Type: General Level of consciousness: awake and alert and oriented Pain management: pain level controlled Vital Signs Assessment: post-procedure vital signs reviewed and stable Respiratory status: spontaneous breathing, nonlabored ventilation and respiratory function stable Cardiovascular status: blood pressure returned to baseline and stable Postop Assessment: no signs of nausea or vomiting Anesthetic complications: no     Last Vitals:  Vitals:   12/09/18 1337 12/09/18 1350  BP: 100/69 103/74  Pulse:  70  Resp:  18  Temp: (!) 36.1 C (!) 36.1 C  SpO2:  97%    Last Pain:  Vitals:   12/09/18 1350  TempSrc: Oral  PainSc: 0-No pain                 Miosha Behe

## 2018-12-10 ENCOUNTER — Encounter: Payer: Self-pay | Admitting: Surgery

## 2018-12-10 LAB — SURGICAL PATHOLOGY

## 2019-01-05 ENCOUNTER — Encounter: Payer: Self-pay | Admitting: Occupational Therapy

## 2019-01-05 ENCOUNTER — Ambulatory Visit: Payer: Medicare PPO | Attending: Student | Admitting: Occupational Therapy

## 2019-01-05 ENCOUNTER — Other Ambulatory Visit: Payer: Self-pay

## 2019-01-05 DIAGNOSIS — M6281 Muscle weakness (generalized): Secondary | ICD-10-CM | POA: Diagnosis present

## 2019-01-05 DIAGNOSIS — L905 Scar conditions and fibrosis of skin: Secondary | ICD-10-CM | POA: Diagnosis not present

## 2019-01-05 DIAGNOSIS — M25642 Stiffness of left hand, not elsewhere classified: Secondary | ICD-10-CM | POA: Diagnosis present

## 2019-01-05 NOTE — Therapy (Signed)
[facility] [address], [place], [address] Phone: [phone]   Fax:  [phone]  Occupational Therapy Evaluation  Patient Details  Name: Martin Adkins MRN: [id number] Date of Birth: [birth year] Referring Provider (OT): Poggi   Encounter Date: 01/05/2019  OT End of Session - 01/05/19 1447    Visit Number  1    Number of Visits  12    OT Start Time  [id number]    OT Stop Time  1431    OT Time Calculation (min)  46 min    Activity Tolerance  Patient tolerated treatment well    Behavior During Therapy  [facility] for tasks assessed/performed       Past Medical History:  Diagnosis Date  . AAA (abdominal aortic aneurysm) without rupture ([place]) 04/05/2014  . AAA (abdominal aortic aneurysm) without rupture ([place]) 04/05/2014  . Abdominal aortic aneurysm without rupture ([place])   . Acute non-ST elevation myocardial infarction (NSTEMI) ([place]) 08/15/2014  . 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/28/2014  . APS (antiphospholipid syndrome) ([place])   . Arteriosclerosis of coronary artery 04/05/2014   Overview:  Sp cabg with lima to lad svg to d1 om1 rca 2004 with occluded svg to rca and d1 and pci stetn of om1 graft 2015   . Arthritis   . B12 deficiency 03/21/2017  . Barrett's esophagus   . Benign essential HTN 05/02/2014  . BPH (benign prostatic hyperplasia)   . CAD (coronary artery disease)   . CHF (congestive heart failure) ([place])   . Chronic systolic heart failure ([place]) 04/19/2014   Overview:  With segmental LV systolic dysfunction ejection fraction of 35%   . CLL (chronic lymphocytic leukemia) ([place])   . CLL (chronic lymphocytic leukemia) ([place]) 05/24/2017  . DVT (deep venous thrombosis) ([place])   . Dysrhythmia    ATRIAL FIB.  Marland Kitchen GERD (gastroesophageal reflux disease)   . History of hiatal hernia   . History of shingles 2013  . Hyperlipemia   . Hyperplastic polyps of stomach   . Hypertension   . Myocardial infarction ([place])  07/30/2014  . NSTEMI (non-ST elevated myocardial infarction) ([place])   . OSA (obstructive sleep apnea)    does not use CPAP  . Osteoarthritis   . Pneumonia   . Pre-diabetes   . PVD (peripheral vascular disease) ([place])   . RA (rheumatoid arthritis) ([place])   . SOB (shortness of breath)     Past Surgical History:  Procedure Laterality Date  . ABDOMINAL AORTA STENT    . CHOLECYSTECTOMY    . COLONOSCOPY  11/24/2009  . CORONARY ANGIOPLASTY WITH STENT PLACEMENT  2015  . CORONARY ARTERY BYPASS GRAFT    . 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   Procedure: DUPUYTREN CONTRACTURE RELEASE LEFT LONG FINGER;  Surgeon: Corky Mull, MD;  Location: [facility] ORS;  Service: Orthopedics;  Laterality: Left;  . ENDOVASCULAR REPAIR/STENT GRAFT N/A 09/24/2017   Procedure: ENDOVASCULAR REPAIR/STENT GRAFT;  Surgeon: Algernon Huxley, MD;  Location: [place] [facility];  Service: Cardiovascular;  Laterality: N/A;  . ESOPHAGOGASTRODUODENOSCOPY  05/26/02  03/23/12  . ESOPHAGOGASTRODUODENOSCOPY (EGD) WITH PROPOFOL N/A 10/28/2016   Procedure: ESOPHAGOGASTRODUODENOSCOPY (EGD) WITH PROPOFOL;  Surgeon: Manya Silvas, MD;  Location: [facility] ENDOSCOPY;  Service: Endoscopy;  Laterality: N/A;  . HERNIA REPAIR Left    inguinal  . LEFT HEART CATH AND CORONARY ANGIOGRAPHY N/A 01/19/2018   Procedure: LEFT HEART CATH AND CORONARY ANGIOGRAPHY;  Surgeon: Teodoro Spray, MD;  Location: [place] [facility]  CV LAB;  Service: Cardiovascular;  Laterality: N/A;  . PERIPHERAL VASCULAR CATHETERIZATION N/A 09/06/2014   Procedure: IVC Filter Removal;  Surgeon: Katha Cabal, MD;  Location: [place] [facility] LAB;  Service: Cardiovascular;  Laterality: N/A;  . TRANSURETHRAL RESECTION OF PROSTATE N/A 02/20/2015   Procedure: TRANSURETHRAL RESECTION OF THE PROSTATE (TURP);  Surgeon: Hollice Espy, MD;  Location: [facility] ORS;  Service: Urology;  Laterality: N/A;    There were no vitals filed for this visit.  Subjective Assessment - 01/05/19 1438     Subjective   My hand are still swollen, scar thick and want you to see if some of the scabs can come off - fingers stiff    Pertinent History  Pt had L dupuytrens release on 12/09/2018 with stitches removed on 12/21/2018 - pt arrive with some scabs very thick on proximal scar in palm and volar middle phalanges and PIP of 3rd    Patient Stated Goals  I want to get the scar better so I can make fist and open my hand to get my hand in my pocket, pick up and hold objects - and push thru my palm    Currently in Pain?  Yes    Pain Score  2     Pain Location  Hand    Pain Orientation  Left    Pain Descriptors / Indicators  Aching;Tender;Tightness    Pain Type  Surgical pain    Pain Onset  1 to 4 weeks ago    Pain Frequency  Intermittent    Aggravating Factors   making fist or stretching it open        [facility] OT Assessment - 01/05/19 0001      Assessment   Medical Diagnosis  L dupuytrens release into volar 3rd digit    Referring Provider (OT)  Poggi    Onset Date/Surgical Date  12/09/18    Hand Dominance  Right    Next MD Visit  --   01/21/2018     Home  Environment   Lives With  Alone      Prior Function   Vocation  Retired    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, do Haematologist, house work       Right Hand AROM   R Index  MCP 0-90  90 Degrees    R Index PIP 0-100  95 Degrees    R Long  MCP 0-90  90 Degrees   -30   R Long PIP 0-100  65 Degrees    R Ring  MCP 0-90  85 Degrees   -30   R Ring PIP 0-100  85 Degrees    R Little  MCP 0-90  90 Degrees    R Little PIP 0-100  95 Degrees         scabs removed on proximal palmar scar and volar middle /PIP of 3rd - pt ed on use of scar pad for night time  And silicon sleeve for daytime and night time        OT Treatments/Exercises (OP) - 01/05/19 0001      LUE Contrast Bath   Time  9 minutes    Comments  prior to AROM and scar massage      hand out provided and review with pt   Contrast prior to ROM   prayer stretch for composite  extention stretch 10 reps hold 3 sec  Digits extention stretch 3 x 30 sec Tapping of digits - one at time  5 reps  Tendon glides 10 reps  Scar massage 2-3 x day after contrast  On healed areas  cica scar pad on palm for night time  And silicon sleeve on 3rd digit day time - off 3 x to breath        OT Education - 01/05/19 1446    Education Details  Findings of eval and HEP    Person(s) Educated  Patient    Methods  Explanation;Demonstration;Tactile cues;Verbal cues;Handout    Comprehension  Verbal cues required;Returned demonstration;Verbalized understanding       OT Short Term Goals - 01/05/19 1451      OT SHORT TERM GOAL #1   Title  Pt to be independent in scar management to decrease tenderness and increase AROM with decrease pain    Baseline  scar tender and pain increase to 7/10 at the most - scar thick and scabs still on 2 places for inch each    Time  3    Period  Weeks    Status  New    Target Date  01/26/19      OT SHORT TERM GOAL #2   Title  --    Baseline  --    Time  --    Period  --    Status  --    Target Date  --      OT SHORT TERM GOAL #3   Title  --    Baseline  --    Time  --    Period  --    Status  --    Target Date  --        OT Long Term Goals - 01/05/19 1456      OT LONG TERM GOAL #1   Title  Scar and tenderness improve for pt to tolerate any tectures, tools and pain improve on PRWHE with more than 10 points    Baseline  PRWHE pain 16/50 , tender with scar massage , and AROM    Time  6    Period  Weeks    Status  New    Target Date  02/16/19      OT LONG TERM GOAL #2   Title  L digits AROM increase to [facility] to touch palm to increase functional use to more than 80%    Baseline  Pt using hand in about 65 % , and AROM 3rd PIP 65 , extention -30 at 3rd and 4th MC's    Time  6    Period  Weeks    Status  New    Target Date  02/16/19            Plan - 01/05/19 1448    Clinical Impression Statement  Pt is tomorrow 4 wks s/p L  dupuytrens release into volar 3rd digit middle phalanges - pt with thick scar and scabs still on PIP/middle phalanges and proximal palmar scar for about inch each - some removed and pt fitted with cica scar pad for night time and silicon sleeve for 3rd for day and night time - pt show decrease MC extention and decrease flexion of 3rd digit- tenderness and pain at the most 7/10 with use - decrease grip strength- all limiting his functional use of L hand in ADL's and IADL's    OT Occupational Profile and History  Problem Focused Assessment - Including review of records relating to presenting problem    Occupational performance deficits (Please refer to evaluation for details):  ADL's;IADL's;Play;Leisure    Body Structure / Function / Physical Skills  ADL;Flexibility;ROM;UE functional use;Scar mobility;Edema;Pain;Strength;IADL    Rehab Potential  Good    Clinical Decision Making  Limited treatment options, no task modification necessary    Comorbidities Affecting Occupational Performance:  None    Modification or Assistance to Complete Evaluation   No modification of tasks or assist necessary to complete eval    OT Frequency  2x / week    OT Duration  6 weeks    OT Treatment/Interventions  Self-care/ADL training;Patient/family education;Splinting;Paraffin;Fluidtherapy;Contrast Bath;Manual Therapy;Passive range of motion;Scar mobilization    Plan  assess progress with HEP and adjust as needed    OT Home Exercise Plan  see pt instruction    Consulted and Agree with Plan of Care  Patient       Patient will benefit from skilled therapeutic intervention in order to improve the following deficits and impairments:   Body Structure / Function / Physical Skills: ADL, Flexibility, ROM, UE functional use, Scar mobility, Edema, Pain, Strength, IADL       Visit Diagnosis: Scar condition and fibrosis of skin - Plan: Ot plan of care cert/re-cert  Stiffness of left hand, not elsewhere classified - Plan: Ot  plan of care cert/re-cert  Muscle weakness (generalized) - Plan: Ot plan of care cert/re-cert    Problem List Patient Active Problem List   Diagnosis Date Noted  . Sepsis ([place]) 08/27/2018  . Non-ST elevation MI (NSTEMI) ([place]) 01/18/2018  . AAA (abdominal aortic aneurysm) ([place]) 09/24/2017  . Leaking abdominal aortic aneurysm (AAA) ([place]) 08/22/2017  . CLL (chronic lymphocytic leukemia) ([place]) 05/24/2017  . Goals of care, counseling/discussion 05/24/2017  . B12 deficiency 03/21/2017  . Elevated troponin I level 03/03/2015  . Deep venous thrombosis of profunda femoris vein ([place]) 01/23/2015  . BPH (benign prostatic hyperplasia) 01/23/2015  . Urge urinary incontinence 01/23/2015  . Type 2 diabetes mellitus ([place]) 10/31/2014  . Benign gastric polyp 10/31/2014  . Absence of bladder continence 10/09/2014  . Acute non-ST elevation myocardial infarction (NSTEMI) ([place]) 08/15/2014  . Atrial fibrillation ([place]) 08/05/2014  . Antiphospholipid syndrome ([place]) 08/01/2014  . H/O deep venous thrombosis 08/01/2014  . Non-ST elevation myocardial infarction (NSTEMI), subendocardial infarction, subsequent episode of care ([place]) 07/30/2014  . Barrett esophagus 07/28/2014  . Diabetes mellitus, type 2 ([place]) 07/28/2014  . 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/28/2014  . Hyperplastic adenomatous polyp of stomach 07/28/2014  . Benign essential HTN 05/02/2014  . Chronic systolic heart failure ([place]) 04/19/2014  . AAA (abdominal aortic aneurysm) without rupture ([place]) 04/05/2014  . Arteriosclerosis of coronary artery 04/05/2014  . Breathlessness on exertion 04/05/2014  . Abdominal aortic aneurysm (AAA) without rupture ([place]) 04/05/2014  . Acute non-ST segment elevation myocardial infarction ([place]) 05/14/2013  . Acid reflux 05/04/2013  . Combined fat and carbohydrate induced hyperlipemia 05/03/2013  . Obstructive apnea 05/03/2013  . Arthritis, degenerative 05/03/2013  . Peripheral blood vessel disorder ([place])  05/03/2013  . Arthritis or polyarthritis, rheumatoid ([place]) 05/03/2013  . Compulsive tobacco user syndrome 05/03/2013  . Rheumatoid arthritis ([place]) 05/03/2013  . Peripheral vascular disease ([place]) 05/03/2013  . Current tobacco use 05/03/2013    Rosalyn Gess OTR/L,CLT 01/05/2019, 3:01 PM  [facility] [address], [place], [address] Phone: [phone]   Fax:  [phone]  Name: TRENTEN TWOHIG MRN: [id number] Date of Birth: 1945/04/18

## 2019-01-05 NOTE — Patient Instructions (Signed)
Contrast prior to ROM   prayer stretch for composite extention stretch 10 reps hold 3 sec  Digits extention stretch 3 x 30 sec Tapping of digits - one at time  5 reps  Tendon glides 10 reps  Scar massage 2-3 x day after contrast  On healed areas  cica scar pad on palm for night time  And silicon sleeve on 3rd digit day time - off 3 x to breath

## 2019-01-14 ENCOUNTER — Ambulatory Visit: Payer: Medicare PPO | Attending: Student | Admitting: Occupational Therapy

## 2019-01-14 ENCOUNTER — Other Ambulatory Visit: Payer: Self-pay

## 2019-01-14 DIAGNOSIS — L905 Scar conditions and fibrosis of skin: Secondary | ICD-10-CM | POA: Diagnosis present

## 2019-01-14 DIAGNOSIS — M25642 Stiffness of left hand, not elsewhere classified: Secondary | ICD-10-CM

## 2019-01-14 DIAGNOSIS — M6281 Muscle weakness (generalized): Secondary | ICD-10-CM | POA: Insufficient documentation

## 2019-01-14 NOTE — Patient Instructions (Signed)
Add to HEP prolonged extention stretch for [facility] extention  And pushing into table for [facility] extention And night splint - dorsal hand base splint for night time

## 2019-01-14 NOTE — Therapy (Signed)
[facility] [address], [place], [address] Phone: [phone]   Fax:  [phone]  Occupational Therapy Treatment  Patient Details  Name: Martin Adkins MRN: [id number] Date of Birth: [birth year] Referring Provider (OT): Poggi   Encounter Date: 01/14/2019  OT End of Session - 01/14/19 1216    Visit Number  2    Number of Visits  12    Date for OT Re-Evaluation  02/16/19    OT Start Time  0930    OT Stop Time  1013    OT Time Calculation (min)  43 min    Activity Tolerance  Patient tolerated treatment well    Behavior During Therapy  [facility] for tasks assessed/performed       Past Medical History:  Diagnosis Date  . AAA (abdominal aortic aneurysm) without rupture ([place]) 04/05/2014  . AAA (abdominal aortic aneurysm) without rupture ([place]) 04/05/2014  . Abdominal aortic aneurysm without rupture ([place])   . Acute non-ST elevation myocardial infarction (NSTEMI) ([place]) 08/15/2014  . 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/28/2014  . APS (antiphospholipid syndrome) ([place])   . Arteriosclerosis of coronary artery 04/05/2014   Overview:  Sp cabg with lima to lad svg to d1 om1 rca 2004 with occluded svg to rca and d1 and pci stetn of om1 graft 2015   . Arthritis   . B12 deficiency 03/21/2017  . Barrett's esophagus   . Benign essential HTN 05/02/2014  . BPH (benign prostatic hyperplasia)   . CAD (coronary artery disease)   . CHF (congestive heart failure) ([place])   . Chronic systolic heart failure ([place]) 04/19/2014   Overview:  With segmental LV systolic dysfunction ejection fraction of 35%   . CLL (chronic lymphocytic leukemia) ([place])   . CLL (chronic lymphocytic leukemia) ([place]) 05/24/2017  . DVT (deep venous thrombosis) ([place])   . Dysrhythmia    ATRIAL FIB.  Marland Kitchen GERD (gastroesophageal reflux disease)   . History of hiatal hernia   . History of shingles 2013  . Hyperlipemia   . Hyperplastic polyps of stomach   . Hypertension    . Myocardial infarction ([place]) 07/30/2014  . NSTEMI (non-ST elevated myocardial infarction) ([place])   . OSA (obstructive sleep apnea)    does not use CPAP  . Osteoarthritis   . Pneumonia   . Pre-diabetes   . PVD (peripheral vascular disease) ([place])   . RA (rheumatoid arthritis) ([place])   . SOB (shortness of breath)     Past Surgical History:  Procedure Laterality Date  . ABDOMINAL AORTA STENT    . CHOLECYSTECTOMY    . COLONOSCOPY  11/24/2009  . CORONARY ANGIOPLASTY WITH STENT PLACEMENT  2015  . CORONARY ARTERY BYPASS GRAFT    . 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   Procedure: DUPUYTREN CONTRACTURE RELEASE LEFT LONG FINGER;  Surgeon: Corky Mull, MD;  Location: [facility] ORS;  Service: Orthopedics;  Laterality: Left;  . ENDOVASCULAR REPAIR/STENT GRAFT N/A 09/24/2017   Procedure: ENDOVASCULAR REPAIR/STENT GRAFT;  Surgeon: Algernon Huxley, MD;  Location: [facility];  Service: Cardiovascular;  Laterality: N/A;  . ESOPHAGOGASTRODUODENOSCOPY  05/26/02  03/23/12  . ESOPHAGOGASTRODUODENOSCOPY (EGD) WITH PROPOFOL N/A 10/28/2016   Procedure: ESOPHAGOGASTRODUODENOSCOPY (EGD) WITH PROPOFOL;  Surgeon: Manya Silvas, MD;  Location: [facility] ENDOSCOPY;  Service: Endoscopy;  Laterality: N/A;  . HERNIA REPAIR Left    inguinal  . LEFT HEART CATH AND CORONARY ANGIOGRAPHY N/A 01/19/2018   Procedure: LEFT HEART CATH AND CORONARY ANGIOGRAPHY;  Surgeon: Teodoro Spray, MD;  Location: [facility];  Service: Cardiovascular;  Laterality: N/A;  . PERIPHERAL VASCULAR CATHETERIZATION N/A 09/06/2014   Procedure: IVC Filter Removal;  Surgeon: Katha Cabal, MD;  Location: [facility];  Service: Cardiovascular;  Laterality: N/A;  . TRANSURETHRAL RESECTION OF PROSTATE N/A 02/20/2015   Procedure: TRANSURETHRAL RESECTION OF THE PROSTATE (TURP);  Surgeon: Hollice Espy, MD;  Location: [facility] ORS;  Service: Urology;  Laterality: N/A;    There were no vitals filed for this visit.  Subjective  Assessment - 01/14/19 1214    Subjective   My scar is doing okay -but tight - will it get better here inbetween my fingers - I can see I can bend better - but middle finger hard to straighten    Pertinent History  Pt had L dupuytrens release on 12/09/2018 with stitches removed on 12/21/2018 - pt arrive with some scabs very thick on proximal scar in palm and volar middle phalanges and PIP of 3rd    Patient Stated Goals  I want to get the scar better so I can make fist and open my hand to get my hand in my pocket, pick up and hold objects - and push thru my palm    Currently in Pain?  Yes    Pain Score  2     Pain Location  Hand    Pain Orientation  Left    Pain Descriptors / Indicators  Tender    Pain Type  Surgical pain    Pain Onset  1 to 4 weeks ago    Pain Frequency  Intermittent    Aggravating Factors   scar tender         [facility] OT Assessment - 01/14/19 0001      Right Hand AROM   R Index  MCP 0-90  90 Degrees    R Index PIP 0-100  95 Degrees    R Long  MCP 0-90  90 Degrees   -30   R Long PIP 0-100  85 Degrees    R Ring  MCP 0-90  80 Degrees    R Ring PIP 0-100  95 Degrees    R Little  MCP 0-90  90 Degrees    R Little PIP 0-100  95 Degrees       measurements taken - increase flexion -but extention same         OT Treatments/Exercises (OP) - 01/14/19 0001      LUE Paraffin   Number Minutes Paraffin  8 Minutes    LUE Paraffin Location  Hand    Comments  prior to soft tissue and scar massage- stretch for MCextention       scar massage done - by OT and vibration -as well as new cica scar pad for night time use  And silicon sleeve to cont on 3rd digit   prolonged extention stretch for [facility] extention of digits- 3 x 1 min  And pushing into table - 20 reps  Add to HEP    cont at home with Contrast prior to ROM   prayer stretch for composite extention stretch 10 reps hold 3 sec  Tendon glides 10 reps  Scar massage 2-3 x day after contrast    cica scar pad on palm  for night time  And silicon sleeve on 3rd digit day time - off 3 x to breath   night splint - fabricated dorsal hand base splint for night time use  for 2nd thru 4th    OT Education - 01/14/19 1216    Education Details  progress and splint wearing with HEP    Person(s) Educated  Patient    Methods  Explanation;Demonstration;Tactile cues;Verbal cues;Handout    Comprehension  Verbal cues required;Returned demonstration;Verbalized understanding       OT Short Term Goals - 01/05/19 1451      OT SHORT TERM GOAL #1   Title  Pt to be independent in scar management to decrease tenderness and increase AROM with decrease pain    Baseline  scar tender and pain increase to 7/10 at the most - scar thick and scabs still on 2 places for inch each    Time  3    Period  Weeks    Status  New    Target Date  01/26/19      OT SHORT TERM GOAL #2   Title  --    Baseline  --    Time  --    Period  --    Status  --    Target Date  --      OT SHORT TERM GOAL #3   Title  --    Baseline  --    Time  --    Period  --    Status  --    Target Date  --        OT Long Term Goals - 01/05/19 1456      OT LONG TERM GOAL #1   Title  Scar and tenderness improve for pt to tolerate any tectures, tools and pain improve on PRWHE with more than 10 points    Baseline  PRWHE pain 16/50 , tender with scar massage , and AROM    Time  6    Period  Weeks    Status  New    Target Date  02/16/19      OT LONG TERM GOAL #2   Title  L digits AROM increase to [facility] to touch palm to increase functional use to more than 80%    Baseline  Pt using hand in about 65 % , and AROM 3rd PIP 65 , extention -30 at 3rd and 4th MC's    Time  6    Period  Weeks    Status  New    Target Date  02/16/19            Plan - 01/14/19 1218    Clinical Impression Statement  Pt is about 5 wks s/p L dupuytrens release volar 3rd digit middle phalange - scar inbetween 3rd and 4th webspace -and palm - scabs healing well- tender and  stil thick hypertrophic scar in palm into webspace and 3rd digit- did fabricate night splint - for extention of MC s for 4th thru 2nd -    OT Occupational Profile and History  Problem Focused Assessment - Including review of records relating to presenting problem    Occupational performance deficits (Please refer to evaluation for details):  ADL's;IADL's;Play;Leisure    Body Structure / Function / Physical Skills  ADL;Flexibility;ROM;UE functional use;Scar mobility;Edema;Pain;Strength;IADL    Rehab Potential  Good    Clinical Decision Making  Limited treatment options, no task modification necessary    Comorbidities Affecting Occupational Performance:  None    Modification or Assistance to Complete Evaluation   No modification of tasks or assist necessary to complete eval    OT Frequency  2x / week    OT  Duration  6 weeks    OT Treatment/Interventions  Self-care/ADL training;Patient/family education;Splinting;Paraffin;Fluidtherapy;Contrast Bath;Manual Therapy;Passive range of motion;Scar mobilization    Plan  assess progress with HEP  and splint - extention of 3rd MC    OT Home Exercise Plan  see pt instruction    Consulted and Agree with Plan of Care  Patient       Patient will benefit from skilled therapeutic intervention in order to improve the following deficits and impairments:   Body Structure / Function / Physical Skills: ADL, Flexibility, ROM, UE functional use, Scar mobility, Edema, Pain, Strength, IADL       Visit Diagnosis: Scar condition and fibrosis of skin  Stiffness of left hand, not elsewhere classified  Muscle weakness (generalized)    Problem List Patient Active Problem List   Diagnosis Date Noted  . Sepsis ([place]) 08/27/2018  . Non-ST elevation MI (NSTEMI) ([place]) 01/18/2018  . AAA (abdominal aortic aneurysm) ([place]) 09/24/2017  . Leaking abdominal aortic aneurysm (AAA) ([place]) 08/22/2017  . CLL (chronic lymphocytic leukemia) ([place]) 05/24/2017  . Goals of care,  counseling/discussion 05/24/2017  . B12 deficiency 03/21/2017  . Elevated troponin I level 03/03/2015  . Deep venous thrombosis of profunda femoris vein ([place]) 01/23/2015  . BPH (benign prostatic hyperplasia) 01/23/2015  . Urge urinary incontinence 01/23/2015  . Type 2 diabetes mellitus ([place]) 10/31/2014  . Benign gastric polyp 10/31/2014  . Absence of bladder continence 10/09/2014  . Acute non-ST elevation myocardial infarction (NSTEMI) ([place]) 08/15/2014  . Atrial fibrillation ([place]) 08/05/2014  . Antiphospholipid syndrome ([place]) 08/01/2014  . H/O deep venous thrombosis 08/01/2014  . Non-ST elevation myocardial infarction (NSTEMI), subendocardial infarction, subsequent episode of care ([place]) 07/30/2014  . Barrett esophagus 07/28/2014  . Diabetes mellitus, type 2 ([place]) 07/28/2014  . 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/28/2014  . Hyperplastic adenomatous polyp of stomach 07/28/2014  . Benign essential HTN 05/02/2014  . Chronic systolic heart failure ([place]) 04/19/2014  . AAA (abdominal aortic aneurysm) without rupture ([place]) 04/05/2014  . Arteriosclerosis of coronary artery 04/05/2014  . Breathlessness on exertion 04/05/2014  . Abdominal aortic aneurysm (AAA) without rupture ([place]) 04/05/2014  . Acute non-ST segment elevation myocardial infarction ([place]) 05/14/2013  . Acid reflux 05/04/2013  . Combined fat and carbohydrate induced hyperlipemia 05/03/2013  . Obstructive apnea 05/03/2013  . Arthritis, degenerative 05/03/2013  . Peripheral blood vessel disorder ([place]) 05/03/2013  . Arthritis or polyarthritis, rheumatoid ([place]) 05/03/2013  . Compulsive tobacco user syndrome 05/03/2013  . Rheumatoid arthritis ([place]) 05/03/2013  . Peripheral vascular disease ([place]) 05/03/2013  . Current tobacco use 05/03/2013    Rosalyn Gess OTR/L,CLT 01/14/2019, 12:21 PM  [facility] [address] [place], [address] Phone: [phone]    Fax:  [phone]  Name: Martin Adkins MRN: [id number] Date of Birth: 1945-10-01

## 2019-01-18 ENCOUNTER — Ambulatory Visit: Payer: Medicare PPO | Admitting: Occupational Therapy

## 2019-01-18 ENCOUNTER — Other Ambulatory Visit: Payer: Self-pay

## 2019-01-18 DIAGNOSIS — L905 Scar conditions and fibrosis of skin: Secondary | ICD-10-CM

## 2019-01-18 DIAGNOSIS — M6281 Muscle weakness (generalized): Secondary | ICD-10-CM

## 2019-01-18 DIAGNOSIS — M25642 Stiffness of left hand, not elsewhere classified: Secondary | ICD-10-CM

## 2019-01-18 NOTE — Patient Instructions (Signed)
Same HEP - but can wear cica scar pad some more during day  And interlocking for digits for webspace

## 2019-01-18 NOTE — Therapy (Signed)
[facility] [address] [place], [address] Phone: [phone]   Fax:  [phone]  Occupational Therapy Treatment  Patient Details  Name: Martin Adkins MRN: [id number] Date of Birth: September 05, 1945 Referring Provider (OT): Poggi   Encounter Date: 01/18/2019  OT End of Session - 01/18/19 0848    Visit Number  3    Number of Visits  12    Date for OT Re-Evaluation  02/16/19    OT Start Time  0836    OT Stop Time  0910    OT Time Calculation (min)  34 min    Activity Tolerance  Patient tolerated treatment well    Behavior During Therapy  [facility] for tasks assessed/performed       Past Medical History:  Diagnosis Date  . AAA (abdominal aortic aneurysm) without rupture ([place]) 04/05/2014  . AAA (abdominal aortic aneurysm) without rupture ([place]) 04/05/2014  . Abdominal aortic aneurysm without rupture ([place])   . Acute non-ST elevation myocardial infarction (NSTEMI) ([place]) 08/15/2014  . 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/28/2014  . APS (antiphospholipid syndrome) ([place])   . Arteriosclerosis of coronary artery 04/05/2014   Overview:  Sp cabg with lima to lad svg to d1 om1 rca 2004 with occluded svg to rca and d1 and pci stetn of om1 graft 2015   . Arthritis   . B12 deficiency 03/21/2017  . Barrett's esophagus   . Benign essential HTN 05/02/2014  . BPH (benign prostatic hyperplasia)   . CAD (coronary artery disease)   . CHF (congestive heart failure) ([place])   . Chronic systolic heart failure ([place]) 04/19/2014   Overview:  With segmental LV systolic dysfunction ejection fraction of 35%   . CLL (chronic lymphocytic leukemia) ([place])   . CLL (chronic lymphocytic leukemia) ([place]) 05/24/2017  . DVT (deep venous thrombosis) ([place])   . Dysrhythmia    ATRIAL FIB.  Marland Kitchen GERD (gastroesophageal reflux disease)   . History of hiatal hernia   . History of shingles 2013  . Hyperlipemia   . Hyperplastic polyps of stomach   . Hypertension    . Myocardial infarction ([place]) 07/30/2014  . NSTEMI (non-ST elevated myocardial infarction) ([place])   . OSA (obstructive sleep apnea)    does not use CPAP  . Osteoarthritis   . Pneumonia   . Pre-diabetes   . PVD (peripheral vascular disease) ([place])   . RA (rheumatoid arthritis) ([place])   . SOB (shortness of breath)     Past Surgical History:  Procedure Laterality Date  . ABDOMINAL AORTA STENT    . CHOLECYSTECTOMY    . COLONOSCOPY  11/24/2009  . CORONARY ANGIOPLASTY WITH STENT PLACEMENT  2015  . CORONARY ARTERY BYPASS GRAFT    . 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   Procedure: DUPUYTREN CONTRACTURE RELEASE LEFT LONG FINGER;  Surgeon: Corky Mull, MD;  Location: [facility] ORS;  Service: Orthopedics;  Laterality: Left;  . ENDOVASCULAR REPAIR/STENT GRAFT N/A 09/24/2017   Procedure: ENDOVASCULAR REPAIR/STENT GRAFT;  Surgeon: Algernon Huxley, MD;  Location: [facility];  Service: Cardiovascular;  Laterality: N/A;  . ESOPHAGOGASTRODUODENOSCOPY  05/26/02  03/23/12  . ESOPHAGOGASTRODUODENOSCOPY (EGD) WITH PROPOFOL N/A 10/28/2016   Procedure: ESOPHAGOGASTRODUODENOSCOPY (EGD) WITH PROPOFOL;  Surgeon: Manya Silvas, MD;  Location[facility] ENDOSCOPY;  Service: Endoscopy;  Laterality: N/A;  . HERNIA REPAIR Left    inguinal  . LEFT HEART CATH AND CORONARY ANGIOGRAPHY N/A 01/19/2018   Procedure: LEFT HEART CATH AND CORONARY ANGIOGRAPHY;  Surgeon: Teodoro Spray, MD;  Location: [facility];  Service: Cardiovascular;  Laterality: N/A;  . PERIPHERAL VASCULAR CATHETERIZATION N/A 09/06/2014   Procedure: IVC Filter Removal;  Surgeon: Katha Cabal, MD;  Location: [place] [facility];  Service: Cardiovascular;  Laterality: N/A;  . TRANSURETHRAL RESECTION OF PROSTATE N/A 02/20/2015   Procedure: TRANSURETHRAL RESECTION OF THE PROSTATE (TURP);  Surgeon: Hollice Espy, MD;  Location: [facility] ORS;  Service: Urology;  Laterality: N/A;    There were no vitals filed for this visit.  Subjective  Assessment - 01/18/19 0847    Subjective   I am using it more - much better- but just stiff - mostly in the am - and night time can tolerate splint about 4 hrs - than the hand hurts    Pertinent History  Pt had L dupuytrens release on 12/09/2018 with stitches removed on 12/21/2018 - pt arrive with some scabs very thick on proximal scar in palm and volar middle phalanges and PIP of 3rd    Patient Stated Goals  I want to get the scar better so I can make fist and open my hand to get my hand in my pocket, pick up and hold objects - and push thru my palm    Currently in Pain?  No/denies         [facility] OT Assessment - 01/18/19 0001      Right Hand AROM   R Long PIP 0-100  90 Degrees               OT Treatments/Exercises (OP) - 01/18/19 0001      LUE Paraffin   Number Minutes Paraffin  8 Minutes    LUE Paraffin Location  Hand    Comments  extention stretch -and prior to soft tissue and ROM        scar massage done - by OT and vibration- improving distal palm and proximal phalanges par - still thick proximal palm , webspace and volar PIP  Focusing on  -cont with cica scar pad for night time use - can wear some during day  And silicon sleeve to cont on 3rd digit  prolonged extention stretch for [facility] extention of digits- 3 x 1 min  And pushing into table - 20 reps  And can do interlocking Add to HEP   cont at home with Contrast prior to ROM  prayer stretch for composite extention stretch 10 reps hold 3 sec  Tendon glides 10 reps  Scar massage 2-3 x day after contrast    cica scar pad on palm for night time  And silicon sleeve on 3rd digit day time - off 3 x to breath  Assess fit for night splint - fabricated dorsal hand base splint for night time use last time   fit well - pt can tolerate it about 3-5 hrs        OT Education - 01/18/19 0848    Education Details  progress , HEP    Person(s) Educated  Patient    Methods  Explanation;Demonstration;Tactile cues;Verbal  cues;Handout    Comprehension  Verbal cues required;Returned demonstration;Verbalized understanding       OT Short Term Goals - 01/05/19 1451      OT SHORT TERM GOAL #1   Title  Pt to be independent in scar management to decrease tenderness and increase AROM with decrease pain    Baseline  scar tender and pain increase to 7/10 at the most - scar thick and  scabs still on 2 places for inch each    Time  3    Period  Weeks    Status  New    Target Date  01/26/19      OT SHORT TERM GOAL #2   Title  --    Baseline  --    Time  --    Period  --    Status  --    Target Date  --      OT SHORT TERM GOAL #3   Title  --    Baseline  --    Time  --    Period  --    Status  --    Target Date  --        OT Long Term Goals - 01/05/19 1456      OT LONG TERM GOAL #1   Title  Scar and tenderness improve for pt to tolerate any tectures, tools and pain improve on PRWHE with more than 10 points    Baseline  PRWHE pain 16/50 , tender with scar massage , and AROM    Time  6    Period  Weeks    Status  New    Target Date  02/16/19      OT LONG TERM GOAL #2   Title  L digits AROM increase to [facility] to touch palm to increase functional use to more than 80%    Baseline  Pt using hand in about 65 % , and AROM 3rd PIP 65 , extention -30 at 3rd and 4th MC's    Time  6    Period  Weeks    Status  New    Target Date  02/16/19            Plan - 01/18/19 0849    Clinical Impression Statement  Pt is about 6 wks s/p L dupuytrens release volar 3rd middle phalanges , inbetween 3rd and 4th to palm - scar improving greatly -still thick and tender - focusing on proximal part - over webspace and volar PIP    OT Occupational Profile and History  Problem Focused Assessment - Including review of records relating to presenting problem    Occupational performance deficits (Please refer to evaluation for details):  ADL's;IADL's;Play;Leisure    Body Structure / Function / Physical Skills   ADL;Flexibility;ROM;UE functional use;Scar mobility;Edema;Pain;Strength;IADL    Rehab Potential  Good    Clinical Decision Making  Limited treatment options, no task modification necessary    Comorbidities Affecting Occupational Performance:  None    Modification or Assistance to Complete Evaluation   No modification of tasks or assist necessary to complete eval    OT Frequency  2x / week    OT Duration  6 weeks    OT Treatment/Interventions  Self-care/ADL training;Patient/family education;Splinting;Paraffin;Fluidtherapy;Contrast Bath;Manual Therapy;Passive range of motion;Scar mobilization    Plan  assess progress with HEP  and splint - extention of 3rd MC    OT Home Exercise Plan  see pt instruction    Consulted and Agree with Plan of Care  Patient       Patient will benefit from skilled therapeutic intervention in order to improve the following deficits and impairments:   Body Structure / Function / Physical Skills: ADL, Flexibility, ROM, UE functional use, Scar mobility, Edema, Pain, Strength, IADL       Visit Diagnosis: Scar condition and fibrosis of skin  Stiffness of left hand, not elsewhere classified  Muscle weakness (  generalized)    Problem List Patient Active Problem List   Diagnosis Date Noted  . Sepsis ([place]) 08/27/2018  . Non-ST elevation MI (NSTEMI) ([place]) 01/18/2018  . AAA (abdominal aortic aneurysm) ([place]) 09/24/2017  . Leaking abdominal aortic aneurysm (AAA) ([place]) 08/22/2017  . CLL (chronic lymphocytic leukemia) ([place]) 05/24/2017  . Goals of care, counseling/discussion 05/24/2017  . B12 deficiency 03/21/2017  . Elevated troponin I level 03/03/2015  . Deep venous thrombosis of profunda femoris vein ([place]) 01/23/2015  . BPH (benign prostatic hyperplasia) 01/23/2015  . Urge urinary incontinence 01/23/2015  . Type 2 diabetes mellitus ([place]) 10/31/2014  . Benign gastric polyp 10/31/2014  . Absence of bladder continence 10/09/2014  . Acute non-ST elevation  myocardial infarction (NSTEMI) ([place]) 08/15/2014  . Atrial fibrillation ([place]) 08/05/2014  . Antiphospholipid syndrome ([place]) 08/01/2014  . H/O deep venous thrombosis 08/01/2014  . Non-ST elevation myocardial infarction (NSTEMI), subendocardial infarction, subsequent episode of care ([place]) 07/30/2014  . Barrett esophagus 07/28/2014  . Diabetes mellitus, type 2 ([place]) 07/28/2014  . 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/28/2014  . Hyperplastic adenomatous polyp of stomach 07/28/2014  . Benign essential HTN 05/02/2014  . Chronic systolic heart failure ([place]) 04/19/2014  . AAA (abdominal aortic aneurysm) without rupture ([place]) 04/05/2014  . Arteriosclerosis of coronary artery 04/05/2014  . Breathlessness on exertion 04/05/2014  . Abdominal aortic aneurysm (AAA) without rupture ([place]) 04/05/2014  . Acute non-ST segment elevation myocardial infarction ([place]) 05/14/2013  . Acid reflux 05/04/2013  . Combined fat and carbohydrate induced hyperlipemia 05/03/2013  . Obstructive apnea 05/03/2013  . Arthritis, degenerative 05/03/2013  . Peripheral blood vessel disorder ([place]) 05/03/2013  . Arthritis or polyarthritis, rheumatoid ([place]) 05/03/2013  . Compulsive tobacco user syndrome 05/03/2013  . Rheumatoid arthritis ([place]) 05/03/2013  . Peripheral vascular disease ([place]) 05/03/2013  . Current tobacco use 05/03/2013    Rosalyn Gess OTR/l,CLT 01/18/2019, 9:29 AM  [facility] [address] [place], [address] Phone: [phone]   Fax:  [phone]  Name: Martin Adkins MRN: [id number] Date of Birth: 05/22/1945

## 2019-01-22 ENCOUNTER — Ambulatory Visit: Payer: Medicare PPO | Admitting: Occupational Therapy

## 2019-01-22 DIAGNOSIS — L905 Scar conditions and fibrosis of skin: Secondary | ICD-10-CM

## 2019-01-22 DIAGNOSIS — M25642 Stiffness of left hand, not elsewhere classified: Secondary | ICD-10-CM

## 2019-01-22 DIAGNOSIS — M6281 Muscle weakness (generalized): Secondary | ICD-10-CM

## 2019-01-22 NOTE — Therapy (Signed)
[facility] [address] [place], [address] Phone: [phone]   Fax:  [phone]  Occupational Therapy Treatment  Patient Details  Name: Martin Adkins MRN: [id number] Date of Birth: April 13, 1945 Referring Provider (OT): Poggi   Encounter Date: 01/22/2019  OT End of Session - 01/22/19 1318    Visit Number  4    Number of Visits  12    Date for OT Re-Evaluation  02/16/19    OT Start Time  1130    OT Stop Time  1201    OT Time Calculation (min)  31 min    Activity Tolerance  Patient tolerated treatment well    Behavior During Therapy  [facility] for tasks assessed/performed       Past Medical History:  Diagnosis Date  . AAA (abdominal aortic aneurysm) without rupture ([place]) 04/05/2014  . AAA (abdominal aortic aneurysm) without rupture ([place]) 04/05/2014  . Abdominal aortic aneurysm without rupture ([place])   . Acute non-ST elevation myocardial infarction (NSTEMI) ([place]) 08/15/2014  . 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/28/2014  . APS (antiphospholipid syndrome) ([place])   . Arteriosclerosis of coronary artery 04/05/2014   Overview:  Sp cabg with lima to lad svg to d1 om1 rca 2004 with occluded svg to rca and d1 and pci stetn of om1 graft 2015   . Arthritis   . B12 deficiency 03/21/2017  . Barrett's esophagus   . Benign essential HTN 05/02/2014  . BPH (benign prostatic hyperplasia)   . CAD (coronary artery disease)   . CHF (congestive heart failure) ([place])   . Chronic systolic heart failure ([place]) 04/19/2014   Overview:  With segmental LV systolic dysfunction ejection fraction of 35%   . CLL (chronic lymphocytic leukemia) ([place])   . CLL (chronic lymphocytic leukemia) ([place]) 05/24/2017  . DVT (deep venous thrombosis) ([place])   . Dysrhythmia    ATRIAL FIB.  Marland Kitchen GERD (gastroesophageal reflux disease)   . History of hiatal hernia   . History of shingles 2013  . Hyperlipemia   . Hyperplastic polyps of stomach   . Hypertension    . Myocardial infarction ([place]) 07/30/2014  . NSTEMI (non-ST elevated myocardial infarction) ([place])   . OSA (obstructive sleep apnea)    does not use CPAP  . Osteoarthritis   . Pneumonia   . Pre-diabetes   . PVD (peripheral vascular disease) ([place])   . RA (rheumatoid arthritis) ([place])   . SOB (shortness of breath)     Past Surgical History:  Procedure Laterality Date  . ABDOMINAL AORTA STENT    . CHOLECYSTECTOMY    . COLONOSCOPY  11/24/2009  . CORONARY ANGIOPLASTY WITH STENT PLACEMENT  2015  . CORONARY ARTERY BYPASS GRAFT    . 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   Procedure: DUPUYTREN CONTRACTURE RELEASE LEFT LONG FINGER;  Surgeon: Corky Mull, MD;  Location: [facility] ORS;  Service: Orthopedics;  Laterality: Left;  . ENDOVASCULAR REPAIR/STENT GRAFT N/A 09/24/2017   Procedure: ENDOVASCULAR REPAIR/STENT GRAFT;  Surgeon: Algernon Huxley, MD;  Location: [facility];  Service: Cardiovascular;  Laterality: N/A;  . ESOPHAGOGASTRODUODENOSCOPY  05/26/02  03/23/12  . ESOPHAGOGASTRODUODENOSCOPY (EGD) WITH PROPOFOL N/A 10/28/2016   Procedure: ESOPHAGOGASTRODUODENOSCOPY (EGD) WITH PROPOFOL;  Surgeon: Manya Silvas, MD;  Location: [facility] ENDOSCOPY;  Service: Endoscopy;  Laterality: N/A;  . HERNIA REPAIR Left    inguinal  . LEFT HEART CATH AND CORONARY ANGIOGRAPHY N/A 01/19/2018   Procedure: LEFT HEART CATH AND CORONARY ANGIOGRAPHY;  Surgeon: Teodoro Spray, MD;  Location: [place] [facility];  Service: Cardiovascular;  Laterality: N/A;  . PERIPHERAL VASCULAR CATHETERIZATION N/A 09/06/2014   Procedure: IVC Filter Removal;  Surgeon: Katha Cabal, MD;  Location: [place] [facility];  Service: Cardiovascular;  Laterality: N/A;  . TRANSURETHRAL RESECTION OF PROSTATE N/A 02/20/2015   Procedure: TRANSURETHRAL RESECTION OF THE PROSTATE (TURP);  Surgeon: Hollice Espy, MD;  Location: [facility] ORS;  Service: Urology;  Laterality: N/A;    There were no vitals filed for this visit.  Subjective  Assessment - 01/22/19 1317    Subjective   The scar is still thick and tender -  I started wearing the splint during the day to - about 2 x 3 hrs with scar pad    Pertinent History  Pt had L dupuytrens release on 12/09/2018 with stitches removed on 12/21/2018 - pt arrive with some scabs very thick on proximal scar in palm and volar middle phalanges and PIP of 3rd    Patient Stated Goals  I want to get the scar better so I can make fist and open my hand to get my hand in my pocket, pick up and hold objects - and push thru my palm    Currently in Pain?  Yes    Pain Location  Hand    Pain Orientation  Left    Pain Descriptors / Indicators  Tender;Tightness    Pain Type  Surgical pain    Pain Onset  More than a month ago    Aggravating Factors   scar tender         [facility] OT Assessment - 01/22/19 0001      Right Hand AROM   R Long  MCP 0-90  --   -25   R Long PIP 0-100  90 Degrees       Making progress in [facility] extention of 3rd - using extention splint some during day too -and cica scar pad with silicon digi sleeve  Still limited in last 10 degrees of flexion at PIP and intrinsic fist by scar          OT Treatments/Exercises (OP) - 01/22/19 0001      LUE Paraffin   Number Minutes Paraffin  8 Minutes    LUE Paraffin Location  Hand    Comments  extention stretch prrior to scar mobs        scar massage done - by OT and vibration- respond great on scar mobs in session - - still thick proximal palm , webspace and volar PIP   Focusing on  -cont with cica scar pad for night time use - can wear some during day when wearing splint  And silicon sleeve to cont on 3rd digit  prolonged extention stretch for [facility] extention of digits- 3 x 1 min  And pushing into table - 20 reps  Cont with interlocking for webspace stretch   cont at home with Contrast prior to ROM  prayer stretch for composite extention stretch 10 reps hold 3 sec  Tendon glides 10 reps  Scar massage 2-3 x day after  contrast     Cont  night splint  - but pt report he is wearing it some during day for 2-3 hrs at time -dorsal hand base splint for night time  fit well - pt can tolerate it about 3-5 hrs         OT Education - 01/22/19 1318    Education Details  progress ,  HEP    Person(s) Educated  Patient    Methods  Explanation;Demonstration;Tactile cues;Verbal cues;Handout    Comprehension  Verbal cues required;Returned demonstration;Verbalized understanding       OT Short Term Goals - 01/05/19 1451      OT SHORT TERM GOAL #1   Title  Pt to be independent in scar management to decrease tenderness and increase AROM with decrease pain    Baseline  scar tender and pain increase to 7/10 at the most - scar thick and scabs still on 2 places for inch each    Time  3    Period  Weeks    Status  New    Target Date  01/26/19      OT SHORT TERM GOAL #2   Title  --    Baseline  --    Time  --    Period  --    Status  --    Target Date  --      OT SHORT TERM GOAL #3   Title  --    Baseline  --    Time  --    Period  --    Status  --    Target Date  --        OT Long Term Goals - 01/05/19 1456      OT LONG TERM GOAL #1   Title  Scar and tenderness improve for pt to tolerate any tectures, tools and pain improve on PRWHE with more than 10 points    Baseline  PRWHE pain 16/50 , tender with scar massage , and AROM    Time  6    Period  Weeks    Status  New    Target Date  02/16/19      OT LONG TERM GOAL #2   Title  L digits AROM increase to [facility] to touch palm to increase functional use to more than 80%    Baseline  Pt using hand in about 65 % , and AROM 3rd PIP 65 , extention -30 at 3rd and 4th MC's    Time  6    Period  Weeks    Status  New    Target Date  02/16/19            Plan - 01/22/19 1319    Clinical Impression Statement  Pt is about 6 1/2 wks s/p L dupuytrens release volar 3rd digit phalanges , inbetween 3rd and 4th to palm - pt improve greatly from [facility] in  scar, AROM - extention of 3rd MC improving with use of extention splint , scar still thick on volar 3rd digit , webspace and proximal palmar scar - limiting end range flexion and extnetoin - cont with scar management - pt to see surgeon today    OT Occupational Profile and History  Problem Focused Assessment - Including review of records relating to presenting problem    Occupational performance deficits (Please refer to evaluation for details):  ADL's;IADL's;Play;Leisure    Body Structure / Function / Physical Skills  ADL;Flexibility;ROM;UE functional use;Scar mobility;Edema;Pain;Strength;IADL    Rehab Potential  Good    Clinical Decision Making  Limited treatment options, no task modification necessary    Comorbidities Affecting Occupational Performance:  None    Modification or Assistance to Complete Evaluation   No modification of tasks or assist necessary to complete eval    OT Frequency  1x / week    OT Duration  4 weeks  OT Treatment/Interventions  Self-care/ADL training;Patient/family education;Splinting;Paraffin;Fluidtherapy;Contrast Bath;Manual Therapy;Passive range of motion;Scar mobilization    Plan  assess progress with HEP  and splint - extention of 3rd MC and scar    OT Home Exercise Plan  see pt instruction    Consulted and Agree with Plan of Care  Patient       Patient will benefit from skilled therapeutic intervention in order to improve the following deficits and impairments:   Body Structure / Function / Physical Skills: ADL, Flexibility, ROM, UE functional use, Scar mobility, Edema, Pain, Strength, IADL       Visit Diagnosis: Scar condition and fibrosis of skin  Stiffness of left hand, not elsewhere classified  Muscle weakness (generalized)    Problem List Patient Active Problem List   Diagnosis Date Noted  . Sepsis ([place]) 08/27/2018  . Non-ST elevation MI (NSTEMI) ([place]) 01/18/2018  . AAA (abdominal aortic aneurysm) ([place]) 09/24/2017  . Leaking abdominal  aortic aneurysm (AAA) ([place]) 08/22/2017  . CLL (chronic lymphocytic leukemia) ([place]) 05/24/2017  . Goals of care, counseling/discussion 05/24/2017  . B12 deficiency 03/21/2017  . Elevated troponin I level 03/03/2015  . Deep venous thrombosis of profunda femoris vein ([place]) 01/23/2015  . BPH (benign prostatic hyperplasia) 01/23/2015  . Urge urinary incontinence 01/23/2015  . Type 2 diabetes mellitus ([place]) 10/31/2014  . Benign gastric polyp 10/31/2014  . Absence of bladder continence 10/09/2014  . Acute non-ST elevation myocardial infarction (NSTEMI) ([place]) 08/15/2014  . Atrial fibrillation ([place]) 08/05/2014  . Antiphospholipid syndrome ([place]) 08/01/2014  . H/O deep venous thrombosis 08/01/2014  . Non-ST elevation myocardial infarction (NSTEMI), subendocardial infarction, subsequent episode of care ([place]) 07/30/2014  . Barrett esophagus 07/28/2014  . Diabetes mellitus, type 2 ([place]) 07/28/2014  . 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/28/2014  . Hyperplastic adenomatous polyp of stomach 07/28/2014  . Benign essential HTN 05/02/2014  . Chronic systolic heart failure ([place]) 04/19/2014  . AAA (abdominal aortic aneurysm) without rupture ([place]) 04/05/2014  . Arteriosclerosis of coronary artery 04/05/2014  . Breathlessness on exertion 04/05/2014  . Abdominal aortic aneurysm (AAA) without rupture ([place]) 04/05/2014  . Acute non-ST segment elevation myocardial infarction ([place]) 05/14/2013  . Acid reflux 05/04/2013  . Combined fat and carbohydrate induced hyperlipemia 05/03/2013  . Obstructive apnea 05/03/2013  . Arthritis, degenerative 05/03/2013  . Peripheral blood vessel disorder ([place]) 05/03/2013  . Arthritis or polyarthritis, rheumatoid ([place]) 05/03/2013  . Compulsive tobacco user syndrome 05/03/2013  . Rheumatoid arthritis ([place]) 05/03/2013  . Peripheral vascular disease ([place]) 05/03/2013  . Current tobacco use 05/03/2013    Martin Adkins,Martin Adkins 01/22/2019, 1:22 PM  [facility] [address], [place], [address] Phone: [phone]   Fax:  [phone]  Name: Martin Adkins MRN: [id number] Date of Birth: 03-18-1945

## 2019-01-22 NOTE — Patient Instructions (Signed)
Same

## 2019-02-02 ENCOUNTER — Ambulatory Visit: Payer: Medicare PPO | Admitting: Occupational Therapy

## 2019-02-02 ENCOUNTER — Other Ambulatory Visit: Payer: Self-pay

## 2019-02-02 DIAGNOSIS — L905 Scar conditions and fibrosis of skin: Secondary | ICD-10-CM | POA: Diagnosis not present

## 2019-02-02 DIAGNOSIS — M6281 Muscle weakness (generalized): Secondary | ICD-10-CM

## 2019-02-02 DIAGNOSIS — M25642 Stiffness of left hand, not elsewhere classified: Secondary | ICD-10-CM

## 2019-02-02 NOTE — Therapy (Signed)
[facility] [address] [place], [address] Phone: [phone]   Fax:  [phone]  Occupational Therapy Treatment  Patient Details  Name: Martin Adkins MRN: [id number] Date of Birth: Feb 27, 1945 Referring Provider (OT): Poggi   Encounter Date: 02/02/2019  OT End of Session - 02/02/19 0901    Visit Number  5    Number of Visits  12    Date for OT Re-Evaluation  02/16/19    OT Start Time  0902    OT Stop Time  0940    OT Time Calculation (min)  38 min    Activity Tolerance  Patient tolerated treatment well    Behavior During Therapy  [facility] for tasks assessed/performed       Past Medical History:  Diagnosis Date  . AAA (abdominal aortic aneurysm) without rupture ([place]) 04/05/2014  . AAA (abdominal aortic aneurysm) without rupture ([place]) 04/05/2014  . Abdominal aortic aneurysm without rupture ([place])   . Acute non-ST elevation myocardial infarction (NSTEMI) ([place]) 08/15/2014  . 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/28/2014  . APS (antiphospholipid syndrome) ([place])   . Arteriosclerosis of coronary artery 04/05/2014   Overview:  Sp cabg with lima to lad svg to d1 om1 rca 2004 with occluded svg to rca and d1 and pci stetn of om1 graft 2015   . Arthritis   . B12 deficiency 03/21/2017  . Barrett's esophagus   . Benign essential HTN 05/02/2014  . BPH (benign prostatic hyperplasia)   . CAD (coronary artery disease)   . CHF (congestive heart failure) ([place])   . Chronic systolic heart failure ([place]) 04/19/2014   Overview:  With segmental LV systolic dysfunction ejection fraction of 35%   . CLL (chronic lymphocytic leukemia) ([place])   . CLL (chronic lymphocytic leukemia) ([place]) 05/24/2017  . DVT (deep venous thrombosis) ([place])   . Dysrhythmia    ATRIAL FIB.  Marland Kitchen GERD (gastroesophageal reflux disease)   . History of hiatal hernia   . History of shingles 2013  . Hyperlipemia   . Hyperplastic polyps of stomach   . Hypertension    . Myocardial infarction ([place]) 07/30/2014  . NSTEMI (non-ST elevated myocardial infarction) ([place])   . OSA (obstructive sleep apnea)    does not use CPAP  . Osteoarthritis   . Pneumonia   . Pre-diabetes   . PVD (peripheral vascular disease) ([place])   . RA (rheumatoid arthritis) ([place])   . SOB (shortness of breath)     Past Surgical History:  Procedure Laterality Date  . ABDOMINAL AORTA STENT    . CHOLECYSTECTOMY    . COLONOSCOPY  11/24/2009  . CORONARY ANGIOPLASTY WITH STENT PLACEMENT  2015  . CORONARY ARTERY BYPASS GRAFT    . 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   Procedure: DUPUYTREN CONTRACTURE RELEASE LEFT LONG FINGER;  Surgeon: Corky Mull, MD;  Location: [facility] ORS;  Service: Orthopedics;  Laterality: Left;  . ENDOVASCULAR REPAIR/STENT GRAFT N/A 09/24/2017   Procedure: ENDOVASCULAR REPAIR/STENT GRAFT;  Surgeon: Algernon Huxley, MD;  Location: [facility];  Service: Cardiovascular;  Laterality: N/A;  . ESOPHAGOGASTRODUODENOSCOPY  05/26/02  03/23/12  . ESOPHAGOGASTRODUODENOSCOPY (EGD) WITH PROPOFOL N/A 10/28/2016   Procedure: ESOPHAGOGASTRODUODENOSCOPY (EGD) WITH PROPOFOL;  Surgeon: Manya Silvas, MD;  Location: [facility] ENDOSCOPY;  Service: Endoscopy;  Laterality: N/A;  . HERNIA REPAIR Left    inguinal  . LEFT HEART CATH AND CORONARY ANGIOGRAPHY N/A 01/19/2018   Procedure: LEFT HEART CATH AND CORONARY ANGIOGRAPHY;  Surgeon: Teodoro Spray, MD;  Location: [facility];  Service: Cardiovascular;  Laterality: N/A;  . PERIPHERAL VASCULAR CATHETERIZATION N/A 09/06/2014   Procedure: IVC Filter Removal;  Surgeon: Katha Cabal, MD;  Location: [facility] [facility];  Service: Cardiovascular;  Laterality: N/A;  . TRANSURETHRAL RESECTION OF PROSTATE N/A 02/20/2015   Procedure: TRANSURETHRAL RESECTION OF THE PROSTATE (TURP);  Surgeon: Hollice Espy, MD;  Location: [facility] ORS;  Service: Urology;  Laterality: N/A;    There were no vitals filed for this visit.  Subjective  Assessment - 02/02/19 0900    Subjective   Seen the Dr - and to cont therapy and will check up again in 6 wks - scar still thick - I can use my hand more and pain not bad doing the exercises - scar is improving    Pertinent History  Pt had L dupuytrens release on 12/09/2018 with stitches removed on 12/21/2018 - pt arrive with some scabs very thick on proximal scar in palm and volar middle phalanges and PIP of 3rd    Patient Stated Goals  I want to get the scar better so I can make fist and open my hand to get my hand in my pocket, pick up and hold objects - and push thru my palm    Currently in Pain?  Yes    Pain Score  1     Pain Location  Hand    Pain Orientation  Left    Pain Descriptors / Indicators  Tightness    Pain Type  Surgical pain    Pain Onset  More than a month ago    Pain Frequency  Intermittent         [facility] OT Assessment - 02/02/19 0001      Strength   Right Hand Grip (lbs)  68    Right Hand Lateral Pinch  23 lbs    Right Hand 3 Point Pinch  18 lbs    Left Hand Grip (lbs)  35    Left Hand Lateral Pinch  18 lbs    Left Hand 3 Point Pinch  11 lbs        AROM 3rd L MC 90 , PIP 90 , extention -30 at [facility] of 3rd   but scar improving at webspace of 3rd nad 4th and distal palmar scar  Still adhere and keloid proximal at scar and volar middle phalanges /PIP         OT Treatments/Exercises (OP) - 02/02/19 0001      LUE Paraffin   Number Minutes Paraffin  8 Minutes    LUE Paraffin Location  Hand    Comments  prior to scar massage        scar massage done - by OT and vibration- respond great on scar mobs in session - - still thick proximal palm ,  volar PIP   Focusing on-cont withcica scar pad for night time use- can wear some during daywhen wearing splint  And silicon sleeve to cont on 3rd digit - new one provided prolonged extention stretch for [facility] extention of digits- 3 x 1 min  And pushing into table - 20 reps Cont with interlocking for webspace  stretch   cont at home with Contrast prior to ROM  prayer stretch for composite extention stretch 10 reps hold 3 sec  Tendon glides 10 reps Add this date and review  Add putty rolling with lat and 3 point pinch in between  12 reps  pain free And end with rolling green firm putty for scar mobs and extention of digits NO GRIPPING     Scar massage 2-3 x day after contrast        OT Education - 02/02/19 0901    Education Details  HEP and add putty    Person(s) Educated  Patient    Methods  Explanation;Demonstration;Tactile cues;Verbal cues;Handout    Comprehension  Verbal cues required;Returned demonstration;Verbalized understanding       OT Short Term Goals - 01/05/19 1451      OT SHORT TERM GOAL #1   Title  Pt to be independent in scar management to decrease tenderness and increase AROM with decrease pain    Baseline  scar tender and pain increase to 7/10 at the most - scar thick and scabs still on 2 places for inch each    Time  3    Period  Weeks    Status  New    Target Date  01/26/19      OT SHORT TERM GOAL #2   Title  --    Baseline  --    Time  --    Period  --    Status  --    Target Date  --      OT SHORT TERM GOAL #3   Title  --    Baseline  --    Time  --    Period  --    Status  --    Target Date  --        OT Long Term Goals - 01/05/19 1456      OT LONG TERM GOAL #1   Title  Scar and tenderness improve for pt to tolerate any tectures, tools and pain improve on PRWHE with more than 10 points    Baseline  PRWHE pain 16/50 , tender with scar massage , and AROM    Time  6    Period  Weeks    Status  New    Target Date  02/16/19      OT LONG TERM GOAL #2   Title  L digits AROM increase to [facility] to touch palm to increase functional use to more than 80%    Baseline  Pt using hand in about 65 % , and AROM 3rd PIP 65 , extention -30 at 3rd and 4th MC's    Time  6    Period  Weeks    Status  New    Target Date  02/16/19             Plan - 02/02/19 0901    Clinical Impression Statement  Pt is about 8 wks s/p L dupuytrens release on volar 3rd diigt phalanges , inbetween 3rd and 4th webspace - scar improving but keloid at proximal part in scar and volar middle phalanges and PIP - limiting last 10 degrees of flexion of PIP - add this date putty for rolling and lat /3 point pinch - no gripping yet    OT Occupational Profile and History  Problem Focused Assessment - Including review of records relating to presenting problem    Occupational performance deficits (Please refer to evaluation for details):  ADL's;IADL's;Play;Leisure    Body Structure / Function / Physical Skills  ADL;Flexibility;ROM;UE functional use;Scar mobility;Edema;Pain;Strength;IADL    Rehab Potential  Good    Clinical Decision Making  Limited treatment options, no task modification necessary    Comorbidities Affecting Occupational Performance:  None  Modification or Assistance to Complete Evaluation   No modification of tasks or assist necessary to complete eval    OT Frequency  1x / week    OT Duration  4 weeks    OT Treatment/Interventions  Self-care/ADL training;Patient/family education;Splinting;Paraffin;Fluidtherapy;Contrast Bath;Manual Therapy;Passive range of motion;Scar mobilization    Plan  assess progress with HEP  and splint - extention of 3rd MC and scar mobd    OT Home Exercise Plan  see pt instruction    Consulted and Agree with Plan of Care  Patient       Patient will benefit from skilled therapeutic intervention in order to improve the following deficits and impairments:   Body Structure / Function / Physical Skills: ADL, Flexibility, ROM, UE functional use, Scar mobility, Edema, Pain, Strength, IADL       Visit Diagnosis: Scar condition and fibrosis of skin  Stiffness of left hand, not elsewhere classified  Muscle weakness (generalized)    Problem List Patient Active Problem List   Diagnosis Date Noted  .  Sepsis ([place]) 08/27/2018  . Non-ST elevation MI (NSTEMI) ([place]) 01/18/2018  . AAA (abdominal aortic aneurysm) ([place]) 09/24/2017  . Leaking abdominal aortic aneurysm (AAA) ([place]) 08/22/2017  . CLL (chronic lymphocytic leukemia) ([place]) 05/24/2017  . Goals of care, counseling/discussion 05/24/2017  . B12 deficiency 03/21/2017  . Elevated troponin I level 03/03/2015  . Deep venous thrombosis of profunda femoris vein ([place]) 01/23/2015  . BPH (benign prostatic hyperplasia) 01/23/2015  . Urge urinary incontinence 01/23/2015  . Type 2 diabetes mellitus ([place]) 10/31/2014  . Benign gastric polyp 10/31/2014  . Absence of bladder continence 10/09/2014  . Acute non-ST elevation myocardial infarction (NSTEMI) ([place]) 08/15/2014  . Atrial fibrillation ([place]) 08/05/2014  . Antiphospholipid syndrome ([place]) 08/01/2014  . H/O deep venous thrombosis 08/01/2014  . Non-ST elevation myocardial infarction (NSTEMI), subendocardial infarction, subsequent episode of care ([place]) 07/30/2014  . Barrett esophagus 07/28/2014  . Diabetes mellitus, type 2 ([place]) 07/28/2014  . 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/28/2014  . Hyperplastic adenomatous polyp of stomach 07/28/2014  . Benign essential HTN 05/02/2014  . Chronic systolic heart failure ([place]) 04/19/2014  . AAA (abdominal aortic aneurysm) without rupture ([place]) 04/05/2014  . Arteriosclerosis of coronary artery 04/05/2014  . Breathlessness on exertion 04/05/2014  . Abdominal aortic aneurysm (AAA) without rupture ([place]) 04/05/2014  . Acute non-ST segment elevation myocardial infarction ([place]) 05/14/2013  . Acid reflux 05/04/2013  . Combined fat and carbohydrate induced hyperlipemia 05/03/2013  . Obstructive apnea 05/03/2013  . Arthritis, degenerative 05/03/2013  . Peripheral blood vessel disorder ([place]) 05/03/2013  . Arthritis or polyarthritis, rheumatoid ([place]) 05/03/2013  . Compulsive tobacco user syndrome 05/03/2013  . Rheumatoid arthritis ([place]) 05/03/2013  . Peripheral  vascular disease ([place]) 05/03/2013  . Current tobacco use 05/03/2013    Rosalyn Gess OTR/L,CLT 02/02/2019, 9:43 AM  [facility] [address], [place], [address] Phone: [phone]   Fax:  [phone]  Name: DAVIDANTHONY MASINO MRN: [id number] Date of Birth: June 15, 1945

## 2019-02-02 NOTE — Patient Instructions (Signed)
Add putty rolling with lat and 3 point pinch in between  12 reps  pain free And end with rolling green firm putty for scar mobs and extention of digits

## 2019-02-16 ENCOUNTER — Other Ambulatory Visit: Payer: Self-pay

## 2019-02-16 ENCOUNTER — Ambulatory Visit: Payer: Medicare PPO | Attending: Student | Admitting: Occupational Therapy

## 2019-02-16 DIAGNOSIS — L905 Scar conditions and fibrosis of skin: Secondary | ICD-10-CM

## 2019-02-16 DIAGNOSIS — M25642 Stiffness of left hand, not elsewhere classified: Secondary | ICD-10-CM | POA: Diagnosis present

## 2019-02-16 DIAGNOSIS — M6281 Muscle weakness (generalized): Secondary | ICD-10-CM

## 2019-02-16 NOTE — Patient Instructions (Signed)
Same but recommend getting mini massager to use at home

## 2019-02-16 NOTE — Therapy (Signed)
[facility] [address], [place], [address] Phone: [phone]   Fax:  [phone]  Occupational Therapy Treatment  Patient Details  Name: Martin Adkins MRN: [id number] Date of Birth: [birth year] Referring Provider (OT): Poggi   Encounter Date: 02/16/2019  OT End of Session - 02/16/19 0829    Visit Number  6    Number of Visits  12    Date for OT Re-Evaluation  02/16/19    OT Start Time  0815    OT Stop Time  0853    OT Time Calculation (min)  38 min    Activity Tolerance  Patient tolerated treatment well    Behavior During Therapy  [facility] for tasks assessed/performed       Past Medical History:  Diagnosis Date  . AAA (abdominal aortic aneurysm) without rupture ([place]) 04/05/2014  . AAA (abdominal aortic aneurysm) without rupture ([place]) 04/05/2014  . Abdominal aortic aneurysm without rupture ([place])   . Acute non-ST elevation myocardial infarction (NSTEMI) ([place]) 08/15/2014  . 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/28/2014  . APS (antiphospholipid syndrome) ([place])   . Arteriosclerosis of coronary artery 04/05/2014   Overview:  Sp cabg with lima to lad svg to d1 om1 rca 2004 with occluded svg to rca and d1 and pci stetn of om1 graft 2015   . Arthritis   . B12 deficiency 03/21/2017  . Barrett's esophagus   . Benign essential HTN 05/02/2014  . BPH (benign prostatic hyperplasia)   . CAD (coronary artery disease)   . CHF (congestive heart failure) ([place])   . Chronic systolic heart failure ([place]) 04/19/2014   Overview:  With segmental LV systolic dysfunction ejection fraction of 35%   . CLL (chronic lymphocytic leukemia) ([place])   . CLL (chronic lymphocytic leukemia) ([place]) 05/24/2017  . DVT (deep venous thrombosis) ([place])   . Dysrhythmia    ATRIAL FIB.  Marland Kitchen GERD (gastroesophageal reflux disease)   . History of hiatal hernia   . History of shingles 2013  . Hyperlipemia   . Hyperplastic polyps of stomach   . Hypertension    . Myocardial infarction ([place]) 07/30/2014  . NSTEMI (non-ST elevated myocardial infarction) ([place])   . OSA (obstructive sleep apnea)    does not use CPAP  . Osteoarthritis   . Pneumonia   . Pre-diabetes   . PVD (peripheral vascular disease) ([place])   . RA (rheumatoid arthritis) ([place])   . SOB (shortness of breath)     Past Surgical History:  Procedure Laterality Date  . ABDOMINAL AORTA STENT    . CHOLECYSTECTOMY    . COLONOSCOPY  11/24/2009  . CORONARY ANGIOPLASTY WITH STENT PLACEMENT  2015  . CORONARY ARTERY BYPASS GRAFT    . 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   Procedure: DUPUYTREN CONTRACTURE RELEASE LEFT LONG FINGER;  Surgeon: Corky Mull, MD;  Location: [facility] ORS;  Service: Orthopedics;  Laterality: Left;  . ENDOVASCULAR REPAIR/STENT GRAFT N/A 09/24/2017   Procedure: ENDOVASCULAR REPAIR/STENT GRAFT;  Surgeon: Algernon Huxley, MD;  Location: [facility];  Service: Cardiovascular;  Laterality: N/A;  . ESOPHAGOGASTRODUODENOSCOPY  05/26/02  03/23/12  . ESOPHAGOGASTRODUODENOSCOPY (EGD) WITH PROPOFOL N/A 10/28/2016   Procedure: ESOPHAGOGASTRODUODENOSCOPY (EGD) WITH PROPOFOL;  Surgeon: Manya Silvas, MD;  Location: [facility] ENDOSCOPY;  Service: Endoscopy;  Laterality: N/A;  . HERNIA REPAIR Left    inguinal  . LEFT HEART CATH AND CORONARY ANGIOGRAPHY N/A 01/19/2018   Procedure: LEFT HEART CATH AND CORONARY ANGIOGRAPHY;  Surgeon: Teodoro Spray, MD;  Location: [facility];  Service: Cardiovascular;  Laterality: N/A;  . PERIPHERAL VASCULAR CATHETERIZATION N/A 09/06/2014   Procedure: IVC Filter Removal;  Surgeon: Katha Cabal, MD;  Location: [facility];  Service: Cardiovascular;  Laterality: N/A;  . TRANSURETHRAL RESECTION OF PROSTATE N/A 02/20/2015   Procedure: TRANSURETHRAL RESECTION OF THE PROSTATE (TURP);  Surgeon: Hollice Espy, MD;  Location: [facility] ORS;  Service: Urology;  Laterality: N/A;    There were no vitals filed for this visit.  Subjective  Assessment - 02/16/19 0826    Subjective   Still tender but using it more - scar still thick - using scar pad during day too    Pertinent History  Pt had L dupuytrens release on 12/09/2018 with stitches removed on 12/21/2018 - pt arrive with some scabs very thick on proximal scar in palm and volar middle phalanges and PIP of 3rd    Patient Stated Goals  I want to get the scar better so I can make fist and open my hand to get my hand in my pocket, pick up and hold objects - and push thru my palm    Currently in Pain?  Yes    Pain Score  1     Pain Location  Hand    Pain Orientation  Left    Pain Descriptors / Indicators  Tightness;Tender    Pain Type  Surgical pain    Pain Onset  More than a month ago    Pain Frequency  Intermittent         [facility] OT Assessment - 02/16/19 0001      Strength   Right Hand Grip (lbs)  68    Right Hand Lateral Pinch  23 lbs    Right Hand 3 Point Pinch  18 lbs    Left Hand Grip (lbs)  35    Left Hand Lateral Pinch  19 lbs    Left Hand 3 Point Pinch  13 lbs      Left Hand AROM   L Long  MCP 0-90  90 Degrees   -20   L Long PIP 0-100  90 Degrees      Pt extention of MC increase 10 degrees and lat and 3 point grip increase   did not do putty for gripping yet - because of scar tissue and extention limited in 3rd MC   pt do rolling of putty  See flow sheet for AROM and strength  PIP flexion at 90 - limited by scar tissue at volar PIP   Replace cica scar pad for night time use and new silicon compression sleeve for 3rd digit for day time and night time           OT Treatments/Exercises (OP) - 02/16/19 0001      LUE Paraffin   Number Minutes Paraffin  8 Minutes    LUE Paraffin Location  Hand    Comments  prior to scar massage and extention stretch        scar massage done - by OT and  Mini massager -respond great on scar mobs in session -- still thick proximal palm ,  volar PIP - recommend to use one at home  Focusing on-cont withcica  scar pad for night time use- can wear some during daywhen wearing splint And silicon sleeve to cont on 3rd digit - prolonged extention stretch for [facility] extention of digits- 3 x 1 min  And pushing into  table - 20 reps Cont withinterlockingfor webspace stretch   cont at home with Contrast prior to ROM  prayer stretch for composite extention stretch 10 reps hold 3 sec  Tendon glides 10 reps Cont putty rolling with lat and 3 point pinch in between  12 reps  pain free And end with rolling green firm putty for scar mobs and extention of digits NO GRIPPING         OT Education - 02/16/19 0829    Education Details  mini massager to use at home recommend - and cont scar management and ROM    Person(s) Educated  Patient    Methods  Explanation;Demonstration;Tactile cues;Verbal cues;Handout    Comprehension  Verbal cues required;Returned demonstration;Verbalized understanding       OT Short Term Goals - 01/05/19 1451      OT SHORT TERM GOAL #1   Title  Pt to be independent in scar management to decrease tenderness and increase AROM with decrease pain    Baseline  scar tender and pain increase to 7/10 at the most - scar thick and scabs still on 2 places for inch each    Time  3    Period  Weeks    Status  New    Target Date  01/26/19      OT SHORT TERM GOAL #2   Title  --    Baseline  --    Time  --    Period  --    Status  --    Target Date  --      OT SHORT TERM GOAL #3   Title  --    Baseline  --    Time  --    Period  --    Status  --    Target Date  --        OT Long Term Goals - 01/05/19 1456      OT LONG TERM GOAL #1   Title  Scar and tenderness improve for pt to tolerate any tectures, tools and pain improve on PRWHE with more than 10 points    Baseline  PRWHE pain 16/50 , tender with scar massage , and AROM    Time  6    Period  Weeks    Status  New    Target Date  02/16/19      OT LONG TERM GOAL #2   Title  L digits AROM increase to [facility] to  touch palm to increase functional use to more than 80%    Baseline  Pt using hand in about 65 % , and AROM 3rd PIP 65 , extention -30 at 3rd and 4th MC's    Time  6    Period  Weeks    Status  New    Target Date  02/16/19            Plan - 02/16/19 0830    Clinical Impression Statement  Pt is about 10 wks s/p L duypuytrens release on volar 3rd digit phalangees , inbetween 3rd and 4th webspace into palm - keloid scar improving slowly - still fibrotic on volar PIP , webspace and proximal part of scar in palm - extention of MC improve 10 degrees this date - recommend pt to get mini massager to use on scar at home -    OT Occupational Profile and History  Problem Focused Assessment - Including review of records relating to presenting problem    Occupational performance deficits (  Please refer to evaluation for details):  ADL's;IADL's;Play;Leisure    Body Structure / Function / Physical Skills  ADL;Flexibility;ROM;UE functional use;Scar mobility;Edema;Pain;Strength;IADL    Rehab Potential  Good    Clinical Decision Making  Limited treatment options, no task modification necessary    Comorbidities Affecting Occupational Performance:  None    Modification or Assistance to Complete Evaluation   No modification of tasks or assist necessary to complete eval    OT Frequency  1x / week    OT Duration  2 weeks    OT Treatment/Interventions  Self-care/ADL training;Patient/family education;Splinting;Paraffin;Fluidtherapy;Contrast Bath;Manual Therapy;Passive range of motion;Scar mobilization    Plan  assess progress with HEP  and splint - extention of 3rd MC and scar mobd    OT Home Exercise Plan  see pt instruction    Consulted and Agree with Plan of Care  Patient       Patient will benefit from skilled therapeutic intervention in order to improve the following deficits and impairments:   Body Structure / Function / Physical Skills: ADL, Flexibility, ROM, UE functional use, Scar mobility, Edema,  Pain, Strength, IADL       Visit Diagnosis: Muscle weakness (generalized)  Stiffness of left hand, not elsewhere classified  Scar condition and fibrosis of skin    Problem List Patient Active Problem List   Diagnosis Date Noted  . Sepsis ([place]) 08/27/2018  . Non-ST elevation MI (NSTEMI) ([place]) 01/18/2018  . AAA (abdominal aortic aneurysm) ([place]) 09/24/2017  . Leaking abdominal aortic aneurysm (AAA) ([place]) 08/22/2017  . CLL (chronic lymphocytic leukemia) ([place]) 05/24/2017  . Goals of care, counseling/discussion 05/24/2017  . B12 deficiency 03/21/2017  . Elevated troponin I level 03/03/2015  . Deep venous thrombosis of profunda femoris vein ([place]) 01/23/2015  . BPH (benign prostatic hyperplasia) 01/23/2015  . Urge urinary incontinence 01/23/2015  . Type 2 diabetes mellitus ([place]) 10/31/2014  . Benign gastric polyp 10/31/2014  . Absence of bladder continence 10/09/2014  . Acute non-ST elevation myocardial infarction (NSTEMI) ([place]) 08/15/2014  . Atrial fibrillation ([place]) 08/05/2014  . Antiphospholipid syndrome ([place]) 08/01/2014  . H/O deep venous thrombosis 08/01/2014  . Non-ST elevation myocardial infarction (NSTEMI), subendocardial infarction, subsequent episode of care ([place]) 07/30/2014  . Barrett esophagus 07/28/2014  . Diabetes mellitus, type 2 ([place]) 07/28/2014  . 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/28/2014  . Hyperplastic adenomatous polyp of stomach 07/28/2014  . Benign essential HTN 05/02/2014  . Chronic systolic heart failure ([place]) 04/19/2014  . AAA (abdominal aortic aneurysm) without rupture ([place]) 04/05/2014  . Arteriosclerosis of coronary artery 04/05/2014  . Breathlessness on exertion 04/05/2014  . Abdominal aortic aneurysm (AAA) without rupture ([place]) 04/05/2014  . Acute non-ST segment elevation myocardial infarction ([place]) 05/14/2013  . Acid reflux 05/04/2013  . Combined fat and carbohydrate induced hyperlipemia 05/03/2013  . Obstructive apnea 05/03/2013  . Arthritis,  degenerative 05/03/2013  . Peripheral blood vessel disorder ([place]) 05/03/2013  . Arthritis or polyarthritis, rheumatoid ([place]) 05/03/2013  . Compulsive tobacco user syndrome 05/03/2013  . Rheumatoid arthritis ([place]) 05/03/2013  . Peripheral vascular disease ([place]) 05/03/2013  . Current tobacco use 05/03/2013    Rosalyn Gess OTR/l,CLT 02/16/2019, 9:04 AM  [facility] [address], [place], [address] Phone: [phone]   Fax:  [phone]  Name: Martin Adkins MRN: [id number] Date of Birth: 1945/06/07

## 2019-03-02 ENCOUNTER — Ambulatory Visit: Payer: Medicare PPO | Admitting: Occupational Therapy

## 2019-03-02 ENCOUNTER — Other Ambulatory Visit: Payer: Self-pay

## 2019-03-02 DIAGNOSIS — M6281 Muscle weakness (generalized): Secondary | ICD-10-CM | POA: Diagnosis not present

## 2019-03-02 DIAGNOSIS — L905 Scar conditions and fibrosis of skin: Secondary | ICD-10-CM

## 2019-03-02 DIAGNOSIS — M25642 Stiffness of left hand, not elsewhere classified: Secondary | ICD-10-CM

## 2019-03-02 NOTE — Therapy (Signed)
[facility] [address], [place], [address] Phone: [phone]   Fax:  [phone]  Occupational Therapy Treatment  Patient Details  Name: Martin Adkins MRN: [id number] Date of Birth: 25-May-1945 Referring Provider (OT): Poggi   Encounter Date: 03/02/2019  OT End of Session - 03/02/19 0923    Visit Number  7    Number of Visits  10    Date for OT Re-Evaluation  04/27/19    OT Start Time  0903    OT Stop Time  0938    OT Time Calculation (min)  35 min    Activity Tolerance  Patient tolerated treatment well    Behavior During Therapy  [facility] for tasks assessed/performed       Past Medical History:  Diagnosis Date  . AAA (abdominal aortic aneurysm) without rupture ([place]) 04/05/2014  . AAA (abdominal aortic aneurysm) without rupture ([place]) 04/05/2014  . Abdominal aortic aneurysm without rupture ([place])   . Acute non-ST elevation myocardial infarction (NSTEMI) ([place]) 08/15/2014  . 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/28/2014  . APS (antiphospholipid syndrome) ([place])   . Arteriosclerosis of coronary artery 04/05/2014   Overview:  Sp cabg with lima to lad svg to d1 om1 rca 2004 with occluded svg to rca and d1 and pci stetn of om1 graft 2015   . Arthritis   . B12 deficiency 03/21/2017  . Barrett's esophagus   . Benign essential HTN 05/02/2014  . BPH (benign prostatic hyperplasia)   . CAD (coronary artery disease)   . CHF (congestive heart failure) ([place])   . Chronic systolic heart failure ([place]) 04/19/2014   Overview:  With segmental LV systolic dysfunction ejection fraction of 35%   . CLL (chronic lymphocytic leukemia) ([place])   . CLL (chronic lymphocytic leukemia) ([place]) 05/24/2017  . DVT (deep venous thrombosis) ([place])   . Dysrhythmia    ATRIAL FIB.  Marland Kitchen GERD (gastroesophageal reflux disease)   . History of hiatal hernia   . History of shingles 2013  . Hyperlipemia   . Hyperplastic polyps of stomach   . Hypertension    . Myocardial infarction ([place]) 07/30/2014  . NSTEMI (non-ST elevated myocardial infarction) ([place])   . OSA (obstructive sleep apnea)    does not use CPAP  . Osteoarthritis   . Pneumonia   . Pre-diabetes   . PVD (peripheral vascular disease) ([place])   . RA (rheumatoid arthritis) ([place])   . SOB (shortness of breath)     Past Surgical History:  Procedure Laterality Date  . ABDOMINAL AORTA STENT    . CHOLECYSTECTOMY    . COLONOSCOPY  11/24/2009  . CORONARY ANGIOPLASTY WITH STENT PLACEMENT  2015  . CORONARY ARTERY BYPASS GRAFT    . 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   Procedure: DUPUYTREN CONTRACTURE RELEASE LEFT LONG FINGER;  Surgeon: Corky Mull, MD;  Location: [facility] ORS;  Service: Orthopedics;  Laterality: Left;  . ENDOVASCULAR REPAIR/STENT GRAFT N/A 09/24/2017   Procedure: ENDOVASCULAR REPAIR/STENT GRAFT;  Surgeon: Algernon Huxley, MD;  Location: [facility];  Service: Cardiovascular;  Laterality: N/A;  . ESOPHAGOGASTRODUODENOSCOPY  05/26/02  03/23/12  . ESOPHAGOGASTRODUODENOSCOPY (EGD) WITH PROPOFOL N/A 10/28/2016   Procedure: ESOPHAGOGASTRODUODENOSCOPY (EGD) WITH PROPOFOL;  Surgeon: Manya Silvas, MD;  Location: [facility] ENDOSCOPY;  Service: Endoscopy;  Laterality: N/A;  . HERNIA REPAIR Left    inguinal  . LEFT HEART CATH AND CORONARY ANGIOGRAPHY N/A 01/19/2018   Procedure: LEFT HEART CATH AND CORONARY ANGIOGRAPHY;  Surgeon: Teodoro Spray, MD;  Location: [facility];  Service: Cardiovascular;  Laterality: N/A;  . PERIPHERAL VASCULAR CATHETERIZATION N/A 09/06/2014   Procedure: IVC Filter Removal;  Surgeon: Katha Cabal, MD;  Location: [facility];  Service: Cardiovascular;  Laterality: N/A;  . TRANSURETHRAL RESECTION OF PROSTATE N/A 02/20/2015   Procedure: TRANSURETHRAL RESECTION OF THE PROSTATE (TURP);  Surgeon: Hollice Espy, MD;  Location: [facility] ORS;  Service: Urology;  Laterality: N/A;    There were no vitals filed for this visit.  Subjective  Assessment - 03/02/19 0921    Subjective   Still little tender - but I bought that massager and I think that helped the scar - using my hand in most everything  but my grip strength still weak    Pertinent History  Pt had L dupuytrens release on 12/09/2018 with stitches removed on 12/21/2018 - pt arrive with some scabs very thick on proximal scar in palm and volar middle phalanges and PIP of 3rd    Patient Stated Goals  I want to get the scar better so I can make fist and open my hand to get my hand in my pocket, pick up and hold objects - and push thru my palm    Currently in Pain?  Yes    Pain Score  2     Pain Location  Hand    Pain Orientation  Left    Pain Descriptors / Indicators  Tender    Pain Type  Surgical pain    Pain Onset  More than a month ago    Pain Frequency  Intermittent    Aggravating Factors   scar tender         [facility] OT Assessment - 03/02/19 0001      Strength   Right Hand Grip (lbs)  68    Right Hand Lateral Pinch  23 lbs    Right Hand 3 Point Pinch  18 lbs    Left Hand Grip (lbs)  38    Left Hand Lateral Pinch  19 lbs    Left Hand 3 Point Pinch  13 lbs      Left Hand AROM   L Long  MCP 0-90  90 Degrees   -20   L Long PIP 0-100  90 Degrees      grip improved but lat and 3 point same - review putty lat and 3 point grip again   increase to 30 reps to 40 - with no pain  But focus on PIP flexion during 3 point grip - pt was flexion at DIP and extention of PIP           OT Treatments/Exercises (OP) - 03/02/19 0001      LUE Paraffin   Number Minutes Paraffin  8 Minutes    LUE Paraffin Location  Hand    Comments  prior to scar massage and ROM        See flow sheet for AROM and strength  PIP flexion at 90 - limited by scar tissue at volar PIP  And MC extention at volar MC of 3rd /4th   Replace cica scar pad for night time use and new silicon compression sleeve for 3rd digit for day time and night time    scar massage done - by OT and using pt's    Mini massager -respond great on scar mobs in session - still thick proximal palm , volar PIP and volar webspace of 3rd/4th  -  recommend to use one at home  Focusing on-cont withcica scar pad for night time use- can wear some during daywhen wearing splint And silicon sleeve to cont on 3rd digit- prolonged extention stretch for [facility] extention of digits- 3 x 1 min  And pushing into table - 20 reps Cont withinterlockingfor webspace stretch   Still NO GRIPPING putty       OT Education - 03/02/19 0922    Education Details  mini massager to cont - and cont scar management and ROM    Person(s) Educated  Patient    Methods  Explanation;Demonstration;Tactile cues;Verbal cues;Handout    Comprehension  Verbal cues required;Returned demonstration;Verbalized understanding       OT Short Term Goals - 03/02/19 0941      OT SHORT TERM GOAL #1   Title  Pt to be independent in scar management to decrease tenderness and increase AROM with decrease pain    Baseline  scar tender and pain increase to 7/10 at the most - scar thick and scabs still on 2 places for inch each - tenderness decreae 1-2/10 and stil 2-3 places of tightness    Time  4    Period  Weeks    Status  On-going    Target Date  03/30/19        OT Long Term Goals - 03/02/19 0942      OT LONG TERM GOAL #1   Title  Scar and tenderness improve for pt to tolerate any tectures, tools and pain improve on PRWHE with more than 10 points    Baseline  PRWHE pain 16/50 , tender with scar massage , and AROM - AROM improved and PRWHE pain score 4/50    Status  Achieved      OT LONG TERM GOAL #2   Title  L digits AROM increase to [facility] to touch palm to increase functional use to more than 80%    Baseline  Pt using hand in about 65 % , and AROM 3rd PIP 65 , extention -30 at 3rd and 4th MC's - NOW using hand 90 % , grip decrease touching palm - PIP flexoin 90 , extention MC -20    Status  Achieved            Plan -  03/02/19 0923    Clinical Impression Statement  Pt is next week 3 months s/p L dupuytrents release of volar 3td digit phalanges and inbetween 3rd and 4th webspace into palm - great progress with keloid scar - still tight and fibrotic at volar PIP , webspace and proximal 2 cm of palmar scar - pt did get mini massager to use at home in combination of scar massage , cica scar pad - extention of MC -20 , and flexion at PIP 90 -  pt to hold off on grip streghthening because of scar tissue - but can do 3 point and lat grip with focus on PIP flexion - pt to follow up in month    OT Occupational Profile and History  Problem Focused Assessment - Including review of records relating to presenting problem    Occupational performance deficits (Please refer to evaluation for details):  ADL's;IADL's;Play;Leisure    Body Structure / Function / Physical Skills  ADL;Flexibility;ROM;UE functional use;Scar mobility;Edema;Pain;Strength;IADL    Rehab Potential  Good    Clinical Decision Making  Limited treatment options, no task modification necessary    Comorbidities Affecting Occupational Performance:  None    Modification or Assistance to  Complete Evaluation   No modification of tasks or assist necessary to complete eval    OT Frequency  Monthly    OT Duration  8 weeks    OT Treatment/Interventions  Self-care/ADL training;Patient/family education;Splinting;Paraffin;Fluidtherapy;Contrast Bath;Manual Therapy;Passive range of motion;Scar mobilization    Plan  assess progress with HEP  and splint - extention of 3rd MC and scar mobd    OT Home Exercise Plan  see pt instruction    Consulted and Agree with Plan of Care  Patient       Patient will benefit from skilled therapeutic intervention in order to improve the following deficits and impairments:   Body Structure / Function / Physical Skills: ADL, Flexibility, ROM, UE functional use, Scar mobility, Edema, Pain, Strength, IADL       Visit Diagnosis: Stiffness of  left hand, not elsewhere classified  Scar condition and fibrosis of skin  Muscle weakness (generalized)    Problem List Patient Active Problem List   Diagnosis Date Noted  . Sepsis ([place]) 08/27/2018  . Non-ST elevation MI (NSTEMI) ([place]) 01/18/2018  . AAA (abdominal aortic aneurysm) ([place]) 09/24/2017  . Leaking abdominal aortic aneurysm (AAA) ([place]) 08/22/2017  . CLL (chronic lymphocytic leukemia) ([place]) 05/24/2017  . Goals of care, counseling/discussion 05/24/2017  . B12 deficiency 03/21/2017  . Elevated troponin I level 03/03/2015  . Deep venous thrombosis of profunda femoris vein ([place]) 01/23/2015  . BPH (benign prostatic hyperplasia) 01/23/2015  . Urge urinary incontinence 01/23/2015  . Type 2 diabetes mellitus ([place]) 10/31/2014  . Benign gastric polyp 10/31/2014  . Absence of bladder continence 10/09/2014  . Acute non-ST elevation myocardial infarction (NSTEMI) ([place]) 08/15/2014  . Atrial fibrillation ([place]) 08/05/2014  . Antiphospholipid syndrome ([place]) 08/01/2014  . H/O deep venous thrombosis 08/01/2014  . Non-ST elevation myocardial infarction (NSTEMI), subendocardial infarction, subsequent episode of care ([place]) 07/30/2014  . Barrett esophagus 07/28/2014  . Diabetes mellitus, type 2 ([place]) 07/28/2014  . 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/28/2014  . Hyperplastic adenomatous polyp of stomach 07/28/2014  . Benign essential HTN 05/02/2014  . Chronic systolic heart failure ([place]) 04/19/2014  . AAA (abdominal aortic aneurysm) without rupture ([place]) 04/05/2014  . Arteriosclerosis of coronary artery 04/05/2014  . Breathlessness on exertion 04/05/2014  . Abdominal aortic aneurysm (AAA) without rupture ([place]) 04/05/2014  . Acute non-ST segment elevation myocardial infarction ([place]) 05/14/2013  . Acid reflux 05/04/2013  . Combined fat and carbohydrate induced hyperlipemia 05/03/2013  . Obstructive apnea 05/03/2013  . Arthritis, degenerative 05/03/2013  . Peripheral blood vessel disorder  ([place]) 05/03/2013  . Arthritis or polyarthritis, rheumatoid ([place]) 05/03/2013  . Compulsive tobacco user syndrome 05/03/2013  . Rheumatoid arthritis ([place]) 05/03/2013  . Peripheral vascular disease ([place]) 05/03/2013  . Current tobacco use 05/03/2013    Rosalyn Gess OTR/l,CLT 03/02/2019, 9:44 AM  [facility] [address] [place], [address] Phone: [phone]   Fax:  [phone]  Name: JOSEANTONIO HEINO MRN: [id number] Date of Birth: March 20, 1945

## 2019-03-30 ENCOUNTER — Other Ambulatory Visit: Payer: Self-pay

## 2019-03-30 ENCOUNTER — Ambulatory Visit: Payer: Medicare PPO | Attending: Student | Admitting: Occupational Therapy

## 2019-03-30 DIAGNOSIS — L905 Scar conditions and fibrosis of skin: Secondary | ICD-10-CM

## 2019-03-30 DIAGNOSIS — M6281 Muscle weakness (generalized): Secondary | ICD-10-CM

## 2019-03-30 DIAGNOSIS — M25642 Stiffness of left hand, not elsewhere classified: Secondary | ICD-10-CM | POA: Insufficient documentation

## 2019-03-30 NOTE — Patient Instructions (Signed)
Cont same HEP

## 2019-03-30 NOTE — Therapy (Signed)
[facility] [address] [place], [address] Phone: [phone]   Fax:  [phone]  Occupational Therapy Treatment  Patient Details  Name: Martin Adkins MRN: [id number] Date of Birth: 1945/03/23 Referring Provider (OT): Poggi   Encounter Date: 03/30/2019  OT End of Session - 03/30/19 0921    Visit Number  8    Number of Visits  10    Date for OT Re-Evaluation  04/27/19    OT Start Time  0903    OT Stop Time  0942    OT Time Calculation (min)  39 min    Activity Tolerance  Patient tolerated treatment well    Behavior During Therapy  [facility] for tasks assessed/performed       Past Medical History:  Diagnosis Date  . AAA (abdominal aortic aneurysm) without rupture ([place]) 04/05/2014  . AAA (abdominal aortic aneurysm) without rupture ([place]) 04/05/2014  . Abdominal aortic aneurysm without rupture ([place])   . Acute non-ST elevation myocardial infarction (NSTEMI) ([place]) 08/15/2014  . 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/28/2014  . APS (antiphospholipid syndrome) ([place])   . Arteriosclerosis of coronary artery 04/05/2014   Overview:  Sp cabg with lima to lad svg to d1 om1 rca 2004 with occluded svg to rca and d1 and pci stetn of om1 graft 2015   . Arthritis   . B12 deficiency 03/21/2017  . Barrett's esophagus   . Benign essential HTN 05/02/2014  . BPH (benign prostatic hyperplasia)   . CAD (coronary artery disease)   . CHF (congestive heart failure) ([place])   . Chronic systolic heart failure ([place]) 04/19/2014   Overview:  With segmental LV systolic dysfunction ejection fraction of 35%   . CLL (chronic lymphocytic leukemia) ([place])   . CLL (chronic lymphocytic leukemia) ([place]) 05/24/2017  . DVT (deep venous thrombosis) ([place])   . Dysrhythmia    ATRIAL FIB.  Marland Kitchen GERD (gastroesophageal reflux disease)   . History of hiatal hernia   . History of shingles 2013  . Hyperlipemia   . Hyperplastic polyps of stomach   . Hypertension    . Myocardial infarction ([place]) 07/30/2014  . NSTEMI (non-ST elevated myocardial infarction) ([place])   . OSA (obstructive sleep apnea)    does not use CPAP  . Osteoarthritis   . Pneumonia   . Pre-diabetes   . PVD (peripheral vascular disease) ([place])   . RA (rheumatoid arthritis) ([place])   . SOB (shortness of breath)     Past Surgical History:  Procedure Laterality Date  . ABDOMINAL AORTA STENT    . CHOLECYSTECTOMY    . COLONOSCOPY  11/24/2009  . CORONARY ANGIOPLASTY WITH STENT PLACEMENT  2015  . CORONARY ARTERY BYPASS GRAFT    . 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   Procedure: DUPUYTREN CONTRACTURE RELEASE LEFT LONG FINGER;  Surgeon: Corky Mull, MD;  Location: [facility] ORS;  Service: Orthopedics;  Laterality: Left;  . ENDOVASCULAR REPAIR/STENT GRAFT N/A 09/24/2017   Procedure: ENDOVASCULAR REPAIR/STENT GRAFT;  Surgeon: Algernon Huxley, MD;  Location: [facility];  Service: Cardiovascular;  Laterality: N/A;  . ESOPHAGOGASTRODUODENOSCOPY  05/26/02  03/23/12  . ESOPHAGOGASTRODUODENOSCOPY (EGD) WITH PROPOFOL N/A 10/28/2016   Procedure: ESOPHAGOGASTRODUODENOSCOPY (EGD) WITH PROPOFOL;  Surgeon: Manya Silvas, MD;  Location: [facility]OSCOPY;  Service: Endoscopy;  Laterality: N/A;  . HERNIA REPAIR Left    inguinal  . LEFT HEART CATH AND CORONARY ANGIOGRAPHY N/A 01/19/2018   Procedure: LEFT HEART CATH AND CORONARY ANGIOGRAPHY;  Surgeon: Teodoro Spray, MD;  Location: [facility];  Service: Cardiovascular;  Laterality: N/A;  . PERIPHERAL VASCULAR CATHETERIZATION N/A 09/06/2014   Procedure: IVC Filter Removal;  Surgeon: Katha Cabal, MD;  Location: [facility];  Service: Cardiovascular;  Laterality: N/A;  . TRANSURETHRAL RESECTION OF PROSTATE N/A 02/20/2015   Procedure: TRANSURETHRAL RESECTION OF THE PROSTATE (TURP);  Surgeon: Hollice Espy, MD;  Location: [facility] ORS;  Service: Urology;  Laterality: N/A;    There were no vitals filed for this visit.  Subjective  Assessment - 03/30/19 0918    Subjective   Still some tenderness when gripping and touching it - but I can grip and carry things -I am working the scar with mini massager    Pertinent History  Pt had L dupuytrens release on 12/09/2018 with stitches removed on 12/21/2018 - pt arrive with some scabs very thick on proximal scar in palm and volar middle phalanges and PIP of 3rd    Patient Stated Goals  I want to get the scar better so I can make fist and open my hand to get my hand in my pocket, pick up and hold objects - and push thru my palm    Currently in Pain?  Yes    Pain Score  2     Pain Location  Hand    Pain Orientation  Left    Pain Descriptors / Indicators  Tender    Pain Type  Surgical pain    Pain Onset  More than a month ago    Pain Frequency  Intermittent         [facility] OT Assessment - 03/30/19 0001      Strength   Right Hand Grip (lbs)  68    Right Hand Lateral Pinch  23 lbs    Right Hand 3 Point Pinch  18 lbs    Left Hand Grip (lbs)  46    Left Hand Lateral Pinch  19 lbs    Left Hand 3 Point Pinch  15 lbs      Left Hand AROM   L Long  MCP 0-90  90 Degrees   -25   L Long PIP 0-100  95 Degrees      Pt made great progress in grip and 3 point grip this past month - scar improving- do have keloid scar - still 3 areas to focus on and to cont with extention splint as tolerated to prevent flexor contracture at 3rd MC   PIP flexion increase to 95 -and extention about same between -20 to -25 degrees         OT Treatments/Exercises (OP) - 03/30/19 0001      LUE Paraffin   Number Minutes Paraffin  8 Minutes    LUE Paraffin Location  Hand    Comments  prior to scar massage - extention stretch        Replace cica scar pad for night time use and new silicon compression sleeves for 3rd digit for day time and night timeto use next month    scar massage done - by OT and using Mini massager-respond great on scar mobs in session - still thick proximal palm , volar PIP  and volar webspace of 3rd/4th - recommend to cont using his mini massager at home  Focusing on-cont withcica scar pad for night time use- can wear some during daywhen wearing splint And silicon sleeve to cont on 3rd digit- prolonged extention stretch for [facility] extention  of digits- 3 x 1 min  And pushing into table - 20 reps Rolling over putty  Cont withinterlockingfor webspace stretch And then using it functionally           OT Education - 03/30/19 0921    Education Details  mini massager to cont - and cont scar management and ROM    Person(s) Educated  Patient    Methods  Explanation;Demonstration;Tactile cues;Verbal cues;Handout    Comprehension  Verbal cues required;Returned demonstration;Verbalized understanding       OT Short Term Goals - 03/02/19 0941      OT SHORT TERM GOAL #1   Title  Pt to be independent in scar management to decrease tenderness and increase AROM with decrease pain    Baseline  scar tender and pain increase to 7/10 at the most - scar thick and scabs still on 2 places for inch each - tenderness decreae 1-2/10 and stil 2-3 places of tightness    Time  4    Period  Weeks    Status  On-going    Target Date  03/30/19        OT Long Term Goals - 03/02/19 0942      OT LONG TERM GOAL #1   Title  Scar and tenderness improve for pt to tolerate any tectures, tools and pain improve on PRWHE with more than 10 points    Baseline  PRWHE pain 16/50 , tender with scar massage , and AROM - AROM improved and PRWHE pain score 4/50    Status  Achieved      OT LONG TERM GOAL #2   Title  L digits AROM increase to [facility] to touch palm to increase functional use to more than 80%    Baseline  Pt using hand in about 65 % , and AROM 3rd PIP 65 , extention -30 at 3rd and 4th MC's - NOW using hand 90 % , grip decrease touching palm - PIP flexoin 90 , extention MC -20    Status  Achieved            Plan - 03/30/19 0921    Clinical Impression Statement  Pt is  next week 4 months s/p L dupuytrens release of volar 3rd digit phalanges and inbetween 3rd and 4th webspace - pt cont to show progress in keloid scar -since seen month ago increase PIP flexion to 95 now , increase grip and 3 point grip - cont to have some areas of scar that wants to be thick and pull 3rd digit into flexion -pt to cont with manual therapy , use of cica scar pad and silicon digi sleeve and extention splint as tolerated    OT Occupational Profile and History  Problem Focused Assessment - Including review of records relating to presenting problem    Occupational performance deficits (Please refer to evaluation for details):  ADL's;IADL's;Play;Leisure    Body Structure / Function / Physical Skills  ADL;Flexibility;ROM;UE functional use;Scar mobility;Edema;Pain;Strength;IADL    Rehab Potential  Good    Clinical Decision Making  Limited treatment options, no task modification necessary    Comorbidities Affecting Occupational Performance:  None    Modification or Assistance to Complete Evaluation   No modification of tasks or assist necessary to complete eval    OT Frequency  Monthly    OT Duration  4 weeks    OT Treatment/Interventions  Self-care/ADL training;Patient/family education;Splinting;Paraffin;Fluidtherapy;Contrast Bath;Manual Therapy;Passive range of motion;Scar mobilization    Plan  assess progress with HEP  and splint - extention of 3rd MC and scar mobd    OT Home Exercise Plan  see pt instruction    Consulted and Agree with Plan of Care  Patient       Patient will benefit from skilled therapeutic intervention in order to improve the following deficits and impairments:   Body Structure / Function / Physical Skills: ADL, Flexibility, ROM, UE functional use, Scar mobility, Edema, Pain, Strength, IADL       Visit Diagnosis: Muscle weakness (generalized)  Scar condition and fibrosis of skin  Stiffness of left hand, not elsewhere classified    Problem List Patient  Active Problem List   Diagnosis Date Noted  . Sepsis ([place]) 08/27/2018  . Non-ST elevation MI (NSTEMI) ([place]) 01/18/2018  . AAA (abdominal aortic aneurysm) ([place]) 09/24/2017  . Leaking abdominal aortic aneurysm (AAA) ([place]) 08/22/2017  . CLL (chronic lymphocytic leukemia) ([place]) 05/24/2017  . Goals of care, counseling/discussion 05/24/2017  . B12 deficiency 03/21/2017  . Elevated troponin I level 03/03/2015  . Deep venous thrombosis of profunda femoris vein ([place]) 01/23/2015  . BPH (benign prostatic hyperplasia) 01/23/2015  . Urge urinary incontinence 01/23/2015  . Type 2 diabetes mellitus ([place]) 10/31/2014  . Benign gastric polyp 10/31/2014  . Absence of bladder continence 10/09/2014  . Acute non-ST elevation myocardial infarction (NSTEMI) ([place]) 08/15/2014  . Atrial fibrillation ([place]) 08/05/2014  . Antiphospholipid syndrome ([place]) 08/01/2014  . H/O deep venous thrombosis 08/01/2014  . Non-ST elevation myocardial infarction (NSTEMI), subendocardial infarction, subsequent episode of care ([place]) 07/30/2014  . Barrett esophagus 07/28/2014  . Diabetes mellitus, type 2 ([place]) 07/28/2014  . 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/28/2014  . Hyperplastic adenomatous polyp of stomach 07/28/2014  . Benign essential HTN 05/02/2014  . Chronic systolic heart failure ([place]) 04/19/2014  . AAA (abdominal aortic aneurysm) without rupture ([place]) 04/05/2014  . Arteriosclerosis of coronary artery 04/05/2014  . Breathlessness on exertion 04/05/2014  . Abdominal aortic aneurysm (AAA) without rupture ([place]) 04/05/2014  . Acute non-ST segment elevation myocardial infarction ([place]) 05/14/2013  . Acid reflux 05/04/2013  . Combined fat and carbohydrate induced hyperlipemia 05/03/2013  . Obstructive apnea 05/03/2013  . Arthritis, degenerative 05/03/2013  . Peripheral blood vessel disorder ([place]) 05/03/2013  . Arthritis or polyarthritis, rheumatoid ([place]) 05/03/2013  . Compulsive tobacco user syndrome 05/03/2013  .  Rheumatoid arthritis ([place]) 05/03/2013  . Peripheral vascular disease ([place]) 05/03/2013  . Current tobacco use 05/03/2013    Rosalyn Gess OTR/L,CLT 03/30/2019, 9:56 AM  [facility] [address] [place], [address] Phone: [phone]   Fax:  [phone]  Name: SVEND WILBUR MRN: [id number] Date of Birth: [birth year]

## 2019-04-06 ENCOUNTER — Inpatient Hospital Stay: Payer: Medicare PPO | Attending: Oncology | Admitting: Oncology

## 2019-04-06 ENCOUNTER — Other Ambulatory Visit: Payer: Self-pay

## 2019-04-06 ENCOUNTER — Inpatient Hospital Stay: Payer: Medicare PPO

## 2019-04-06 ENCOUNTER — Encounter: Payer: Self-pay | Admitting: Oncology

## 2019-04-06 VITALS — BP 120/81 | HR 82 | Temp 97.3°F | Resp 16 | Wt 213.0 lb

## 2019-04-06 DIAGNOSIS — I509 Heart failure, unspecified: Secondary | ICD-10-CM | POA: Insufficient documentation

## 2019-04-06 DIAGNOSIS — R634 Abnormal weight loss: Secondary | ICD-10-CM | POA: Diagnosis not present

## 2019-04-06 DIAGNOSIS — Z803 Family history of malignant neoplasm of breast: Secondary | ICD-10-CM | POA: Insufficient documentation

## 2019-04-06 DIAGNOSIS — Z7901 Long term (current) use of anticoagulants: Secondary | ICD-10-CM | POA: Diagnosis not present

## 2019-04-06 DIAGNOSIS — I714 Abdominal aortic aneurysm, without rupture: Secondary | ICD-10-CM | POA: Diagnosis not present

## 2019-04-06 DIAGNOSIS — Z7982 Long term (current) use of aspirin: Secondary | ICD-10-CM | POA: Diagnosis not present

## 2019-04-06 DIAGNOSIS — E876 Hypokalemia: Secondary | ICD-10-CM | POA: Diagnosis not present

## 2019-04-06 DIAGNOSIS — I2510 Atherosclerotic heart disease of native coronary artery without angina pectoris: Secondary | ICD-10-CM | POA: Diagnosis not present

## 2019-04-06 DIAGNOSIS — E538 Deficiency of other specified B group vitamins: Secondary | ICD-10-CM | POA: Insufficient documentation

## 2019-04-06 DIAGNOSIS — I4891 Unspecified atrial fibrillation: Secondary | ICD-10-CM | POA: Insufficient documentation

## 2019-04-06 DIAGNOSIS — G4733 Obstructive sleep apnea (adult) (pediatric): Secondary | ICD-10-CM | POA: Diagnosis not present

## 2019-04-06 DIAGNOSIS — I739 Peripheral vascular disease, unspecified: Secondary | ICD-10-CM | POA: Diagnosis not present

## 2019-04-06 DIAGNOSIS — D696 Thrombocytopenia, unspecified: Secondary | ICD-10-CM | POA: Diagnosis not present

## 2019-04-06 DIAGNOSIS — E785 Hyperlipidemia, unspecified: Secondary | ICD-10-CM | POA: Insufficient documentation

## 2019-04-06 DIAGNOSIS — C9110 Chronic lymphocytic leukemia of B-cell type not having achieved remission: Secondary | ICD-10-CM

## 2019-04-06 DIAGNOSIS — Z79899 Other long term (current) drug therapy: Secondary | ICD-10-CM | POA: Insufficient documentation

## 2019-04-06 DIAGNOSIS — I252 Old myocardial infarction: Secondary | ICD-10-CM | POA: Diagnosis not present

## 2019-04-06 DIAGNOSIS — M069 Rheumatoid arthritis, unspecified: Secondary | ICD-10-CM | POA: Diagnosis not present

## 2019-04-06 DIAGNOSIS — I10 Essential (primary) hypertension: Secondary | ICD-10-CM | POA: Insufficient documentation

## 2019-04-06 DIAGNOSIS — K219 Gastro-esophageal reflux disease without esophagitis: Secondary | ICD-10-CM | POA: Insufficient documentation

## 2019-04-06 DIAGNOSIS — K760 Fatty (change of) liver, not elsewhere classified: Secondary | ICD-10-CM | POA: Insufficient documentation

## 2019-04-06 DIAGNOSIS — R918 Other nonspecific abnormal finding of lung field: Secondary | ICD-10-CM | POA: Diagnosis not present

## 2019-04-06 DIAGNOSIS — R5383 Other fatigue: Secondary | ICD-10-CM | POA: Diagnosis not present

## 2019-04-06 DIAGNOSIS — F17210 Nicotine dependence, cigarettes, uncomplicated: Secondary | ICD-10-CM | POA: Diagnosis not present

## 2019-04-06 DIAGNOSIS — R161 Splenomegaly, not elsewhere classified: Secondary | ICD-10-CM | POA: Insufficient documentation

## 2019-04-06 DIAGNOSIS — Z8042 Family history of malignant neoplasm of prostate: Secondary | ICD-10-CM | POA: Insufficient documentation

## 2019-04-06 DIAGNOSIS — Z86718 Personal history of other venous thrombosis and embolism: Secondary | ICD-10-CM | POA: Insufficient documentation

## 2019-04-06 DIAGNOSIS — N400 Enlarged prostate without lower urinary tract symptoms: Secondary | ICD-10-CM | POA: Insufficient documentation

## 2019-04-06 LAB — COMPREHENSIVE METABOLIC PANEL
ALT: 28 U/L (ref 0–44)
AST: 34 U/L (ref 15–41)
Albumin: 4.2 g/dL (ref 3.5–5.0)
Alkaline Phosphatase: 115 U/L (ref 38–126)
Anion gap: 9 (ref 5–15)
BUN: 13 mg/dL (ref 8–23)
CO2: 25 mmol/L (ref 22–32)
Calcium: 8.8 mg/dL — ABNORMAL LOW (ref 8.9–10.3)
Chloride: 102 mmol/L (ref 98–111)
Creatinine, Ser: 0.74 mg/dL (ref 0.61–1.24)
GFR calc Af Amer: >60 mL/min (ref >60)
GFR calc non Af Amer: >60 mL/min (ref >60)
Glucose, Bld: 130 mg/dL — ABNORMAL HIGH (ref 70–99)
Potassium: 3.7 mmol/L (ref 3.5–5.1)
Sodium: 136 mmol/L (ref 135–145)
Total Bilirubin: 1.4 mg/dL — ABNORMAL HIGH (ref 0.3–1.2)
Total Protein: 7.5 g/dL (ref 6.5–8.1)

## 2019-04-06 LAB — CBC WITH DIFFERENTIAL/PLATELET
Abs Immature Granulocytes: 0.02 10*3/uL (ref 0.00–0.07)
Basophils Absolute: 0 10*3/uL (ref 0.0–0.1)
Basophils Relative: 0 %
Eosinophils Absolute: 0.1 10*3/uL (ref 0.0–0.5)
Eosinophils Relative: 2 %
HCT: 45.6 % (ref 39.0–52.0)
Hemoglobin: 15.1 g/dL (ref 13.0–17.0)
Immature Granulocytes: 0 %
Lymphocytes Relative: 22 %
Lymphs Abs: 1 10*3/uL (ref 0.7–4.0)
MCH: 29.7 pg (ref 26.0–34.0)
MCHC: 33.1 g/dL (ref 30.0–36.0)
MCV: 89.8 fL (ref 80.0–100.0)
Monocytes Absolute: 0.3 10*3/uL (ref 0.1–1.0)
Monocytes Relative: 8 %
Neutro Abs: 3.1 10*3/uL (ref 1.7–7.7)
Neutrophils Relative %: 68 %
Platelets: 138 10*3/uL — ABNORMAL LOW (ref 150–400)
RBC: 5.08 MIL/uL (ref 4.22–5.81)
RDW: 15.4 % (ref 11.5–15.5)
WBC: 4.5 10*3/uL (ref 4.0–10.5)
nRBC: 0 % (ref 0.0–0.2)

## 2019-04-06 LAB — TSH: TSH: 2.243 u[IU]/mL (ref 0.350–4.500)

## 2019-04-06 LAB — LACTATE DEHYDROGENASE: LDH: 143 U/L (ref 98–192)

## 2019-04-06 NOTE — Progress Notes (Signed)
Hematology/Oncology Follow up note [facility] [phone] [phone]   Patient Care Team: [name], MD as PCP - General (Internal Medicine) Earlie Server, MD as Medical Oncologist (Medical Oncology)  REFERRING PROVIDER: [name], MD  REASON FOR VISIT Follow up for management of CLL HISTORY OF PRESENTING ILLNESS:  Martin Adkins is a  74 y.o.  male with PMH listed below who was referred to me for evaluation of lymphocytosis.  Recent lab work on 03/05/2017 showed wbc 11.4, hb 14.6, platelet count 117,000, lymphocytosis 63.5%, neutrophil 22%, Report chronic fatigue, no weight loss, fever or chills.  He reports feeling chest "rattleness". Has for CT chest which showed acute finding, chronic finding includes  postsurgical changes consistent with coronary bypass grafting. One of the bypass grafts arising from the aorta shows apparent thrombosis and an area of distal aneurysmal dilatation which is also Thrombosed. Right adrenal adenoma stable in appearance.Mild scarring in the lung bases.Fatty liver.   Takes Aspirin 81m, coumadine, plavix. He is on anticoagulation for previous history of clots.  Denies any bleeding events. Use to drink hard liquir daily, quitted for 4-5 years. Current drinks wine on weekend.    Image Studies # 04/04/2017  [place]dom[place] showed 1.  Splenomegaly.  No focal splenic lesions evident.2. Increased liver echogenicity, a finding most likely indicative of hepatic steatosis. While no focal liver lesions are evident on this study, it must be cautioned that the sensitivity of ultrasound for detection of focal liver lesions is diminished in this circumstance.3. Pancreas obscured by gas. Portions of inferior vena cava obscured by gas.4.  Gallbladder absent.5. Status post abdominal aortic aneurysm repair. No periaortic fluid.  # 04/21/2017 PET scan 1. Moderate splenomegaly with uniform splenic hypermetabolism,compatible  with the provided history of lymphoproliferative disease.No additional hypermetabolic sites of lymphoproliferative disease. Specifically, no hypermetabolic lymphadenopathy Hepatic steatosis, aortic atherosclerosis. Aneurysm.    # CLL, stage IV disease given spelencemagly, thrombocytopenia, symptomatic with fatigue and weight loss.  CLL IPL score 4, high risk.   # status post abdominal aortic aneurysm repair on 09/25/2017 s/p cardiac cath which revealed patent LIMA to the LAD, no significant disease in the RCA with a 90% stenosis in a small RV marginal branch.  Occluded left circumflex, occluded LAD.  Saphenous vein grafts are all occluded.  RCA does not require grafting.  LAD is well grafted by the left internal mammary.  Medical management  INTERVAL HISTORY Martin JSHASHANK KWASNIKis a 74y.o. male who has above presents for assessing toxicities from Venetoclax for treatment for CLL.  He has been off venetoclax since 11/11/2017. Patient reports feeling well.  Good appetite, weight has been stable. Occasionally has sweats at night.  Chronic fatigue. No recent infection or hospitalization.  Patient has had release of Dupuytren contracture in December. . Review of Systems  Constitutional: Positive for fatigue. Negative for appetite change, chills, fever and unexpected weight change.  HENT:   Negative for hearing loss and voice change.   Eyes: Negative for eye problems and icterus.  Respiratory: Negative for chest tightness, cough, shortness of breath and wheezing.   Cardiovascular: Negative for chest pain and leg swelling.  Gastrointestinal: Negative for abdominal distention and abdominal pain.  Endocrine: Negative for hot flashes.  Genitourinary: Negative for difficulty urinating, dysuria and frequency.   Musculoskeletal: Negative for arthralgias and back pain.  Skin: Negative for itching and rash.  Neurological: Negative for light-headedness and numbness.  Hematological: Negative for adenopathy.  Does not bruise/bleed easily.  Psychiatric/Behavioral: Negative for confusion.     MEDICAL HISTORY:  Past Medical History:  Diagnosis Date  . AAA (abdominal aortic aneurysm) without rupture ([place]) 04/05/2014  . AAA (abdominal aortic aneurysm) without rupture ([place]) 04/05/2014  . Abdominal aortic aneurysm without rupture ([place])   . Acute non-ST elevation myocardial infarction (NSTEMI) ([place]) 08/15/2014  . 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/28/2014  . APS (antiphospholipid syndrome) ([place])   . Arteriosclerosis of coronary artery 04/05/2014   Overview:  Sp cabg with lima to lad svg to d1 om1 rca 2004 with occluded svg to rca and d1 and pci stetn of om1 graft 2015   . Arthritis   . B12 deficiency 03/21/2017  . Barrett's esophagus   . Benign essential HTN 05/02/2014  . BPH (benign prostatic hyperplasia)   . CAD (coronary artery disease)   . CHF (congestive heart failure) ([place])   . Chronic systolic heart failure ([place]) 04/19/2014   Overview:  With segmental LV systolic dysfunction ejection fraction of 35%   . CLL (chronic lymphocytic leukemia) ([place])   . CLL (chronic lymphocytic leukemia) ([place]) 05/24/2017  . DVT (deep venous thrombosis) ([place])   . Dysrhythmia    ATRIAL FIB.  Marland Kitchen GERD (gastroesophageal reflux disease)   . History of hiatal hernia   . History of shingles 2013  . Hyperlipemia   . Hyperplastic polyps of stomach   . Hypertension   . Myocardial infarction ([place]) 07/30/2014  . NSTEMI (non-ST elevated myocardial infarction) ([place])   . OSA (obstructive sleep apnea)    does not use CPAP  . Osteoarthritis   . Pneumonia   . Pre-diabetes   . PVD (peripheral vascular disease) ([place])   . RA (rheumatoid arthritis) ([place])   . SOB (shortness of breath)     SURGICAL HISTORY: Past Surgical History:  Procedure Laterality Date  . ABDOMINAL AORTA STENT    . CHOLECYSTECTOMY    . COLONOSCOPY  11/24/2009  . CORONARY ANGIOPLASTY WITH STENT PLACEMENT  2015  . CORONARY ARTERY BYPASS GRAFT    .  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   Procedure: DUPUYTREN CONTRACTURE RELEASE LEFT LONG FINGER;  Surgeon: Corky Mull, MD;  Location: [facility] ORS;  Service: Orthopedics;  Laterality: Left;  . ENDOVASCULAR REPAIR/STENT GRAFT N/A 09/24/2017   Procedure: ENDOVASCULAR REPAIR/STENT GRAFT;  Surgeon: Algernon Huxley, MD;  Location: [place] [facility];  Service: Cardiovascular;  Laterality: N/A;  . ESOPHAGOGASTRODUODENOSCOPY  05/26/02  03/23/12  . ESOPHAGOGASTRODUODENOSCOPY (EGD) WITH PROPOFOL N/A 10/28/2016   Procedure: ESOPHAGOGASTRODUODENOSCOPY (EGD) WITH PROPOFOL;  Surgeon: Manya Silvas, MD;  Location[facility] ENDOSCOPY;  Service: Endoscopy;  Laterality: N/A;  . HERNIA REPAIR Left    inguinal  . LEFT HEART CATH AND CORONARY ANGIOGRAPHY N/A 01/19/2018   Procedure: LEFT HEART CATH AND CORONARY ANGIOGRAPHY;  Surgeon: Teodoro Spray, MD;  Location: [place] [facility];  Service: Cardiovascular;  Laterality: N/A;  . PERIPHERAL VASCULAR CATHETERIZATION N/A 09/06/2014   Procedure: IVC Filter Removal;  Surgeon: Katha Cabal, MD;  Location: [place] [facility];  Service: Cardiovascular;  Laterality: N/A;  . TRANSURETHRAL RESECTION OF PROSTATE N/A 02/20/2015   Procedure: TRANSURETHRAL RESECTION OF THE PROSTATE (TURP);  Surgeon: Hollice Espy, MD;  Location: [facility] ORS;  Service: Urology;  Laterality: N/A;    SOCIAL HISTORY: Social History   Socioeconomic History  . Marital status: Legally Separated    Spouse name: Not on file  . Number of children: Not on file  . Years of education: Not on file  .  Highest education level: Not on file  Occupational History  . Not on file  Tobacco Use  . Smoking status: Current Some Day Smoker    Packs/day: 0.50    Years: 20.00    Pack years: 10.00    Types: Cigarettes  . Smokeless tobacco: Never Used  . Tobacco comment: sometimes 2-3 a day  Substance and Sexual Activity  . Alcohol use: Yes    Alcohol/week: 1.0 - 2.0 standard drinks    Types: 1 - 2  Shots of liquor per week  . Drug use: No  . Sexual activity: Never  Other Topics Concern  . Not on file  Social History Narrative  . Not on file   Social Determinants of Health   Financial Resource Strain:   . Difficulty of Paying Living Expenses:   Food Insecurity:   . Worried About Charity fundraiser in the Last Year:   . Arboriculturist in the Last Year:   Transportation Needs:   . Film/video editor (Medical):   Marland Kitchen Lack of Transportation (Non-Medical):   Physical Activity:   . Days of Exercise per Week:   . Minutes of Exercise per Session:   Stress:   . Feeling of Stress :   Social Connections:   . Frequency of Communication with Friends and Family:   . Frequency of Social Gatherings with Friends and Family:   . Attends Religious Services:   . Active Member of Clubs or Organizations:   . Attends Archivist Meetings:   Marland Kitchen Marital Status:   Intimate Partner Violence:   . Fear of Current or Ex-Partner:   . Emotionally Abused:   Marland Kitchen Physically Abused:   . Sexually Abused:     FAMILY HISTORY: Family History  Problem Relation Age of Onset  . Cancer Mother 34       breast ca  . Diabetes Mother   . Coronary artery disease Father   . Heart attack Father   . Prostate cancer Brother   . Bladder Cancer Neg Hx   . Kidney cancer Neg Hx     ALLERGIES:  is allergic to ace inhibitors; pravastatin; rosuvastatin; simvastatin; amoxicillin; niacin; and penicillins.  MEDICATIONS:  Current Outpatient Medications  Medication Sig Dispense Refill  . albuterol (PROVENTIL HFA;VENTOLIN HFA) 108 (90 Base) MCG/ACT inhaler Inhale 2 puffs into the lungs every 6 (six) hours as needed for wheezing or shortness of breath. 1 Inhaler 0  . amLODipine (NORVASC) 10 MG tablet Take 10 mg by mouth daily.     Marland Kitchen atorvastatin (LIPITOR) 80 MG tablet Take 80 mg by mouth daily.   11  . dorzolamide (TRUSOPT) 2 % ophthalmic solution Place 1 drop into both eyes 2 (two) times daily.     .  fluticasone (FLONASE) 50 MCG/ACT nasal spray Place 2 sprays into both nostrils daily.     Marland Kitchen gabapentin (NEURONTIN) 300 MG capsule Take 1 capsule (300 mg total) by mouth 3 (three) times daily. (Patient taking differently: Take 300 mg by mouth 2 (two) times daily. ) 90 capsule 0  . HYDROcodone-acetaminophen (NORCO) 5-325 MG tablet Take 1-2 tablets by mouth every 6 (six) hours as needed for moderate pain. MAXIMUM TOTAL ACETAMINOPHEN DOSE IS 4000 MG PER DAY 30 tablet 0  . Ipratropium-Albuterol (COMBIVENT) 20-100 MCG/ACT AERS respimat Inhale 1 puff into the lungs every 6 (six) hours. 1 Inhaler 11  . isosorbide mononitrate (IMDUR) 60 MG 24 hr tablet Take 1 tablet (60 mg total) by mouth  daily. 30 tablet 0  . latanoprost (XALATAN) 0.005 % ophthalmic solution Place 1 drop into both eyes at bedtime.     . metoprolol succinate (TOPROL-XL) 100 MG 24 hr tablet Take 100 mg by mouth daily.     . pantoprazole (PROTONIX) 40 MG tablet Take 40 mg by mouth daily.     . potassium chloride SA (K-DUR,KLOR-CON) 20 MEQ tablet Take 20 mEq by mouth daily.     . rivaroxaban (XARELTO) 20 MG TABS tablet Take 1 tablet (20 mg total) by mouth daily with supper. 30 tablet 3  . vitamin B-12 (CYANOCOBALAMIN) 1000 MCG tablet      No current facility-administered medications for this visit.     PHYSICAL EXAMINATION: ECOG 1 Vitals:   04/06/19 1033  BP: 120/81  Pulse: 82  Resp: 16  Temp: (!) 97.3 F (36.3 C)  Physical Exam  Constitutional: He is oriented to person, place, and time. No distress.  HENT:  Head: Normocephalic and atraumatic.  Nose: Nose normal.  Mouth/Throat: Oropharynx is clear and moist. No oropharyngeal exudate.  Eyes: Pupils are equal, round, and reactive to light. EOM are normal. Left eye exhibits no discharge. No scleral icterus.  Neck: No JVD present.  Cardiovascular: Normal rate, regular rhythm and normal heart sounds.  No murmur heard. Pulmonary/Chest: Effort normal. No respiratory distress. He has  no wheezes. He has no rales. He exhibits no tenderness.  Abdominal: Soft. He exhibits no distension. There is no abdominal tenderness.  Musculoskeletal:        General: No deformity or edema. Normal range of motion.     Cervical back: Normal range of motion and neck supple.  Lymphadenopathy:    He has no cervical adenopathy.  Neurological: He is alert and oriented to person, place, and time. No cranial nerve deficit. He exhibits normal muscle tone. Coordination normal.  Skin: Skin is warm and dry. He is not diaphoretic. No erythema.  Psychiatric: Affect normal.  Nursing note and vitals reviewed.    LABORATORY DATA:  I have reviewed the data as listed Lab Results  Component Value Date   WBC 4.5 04/06/2019   HGB 15.1 04/06/2019   HCT 45.6 04/06/2019   MCV 89.8 04/06/2019   PLT 138 (L) 04/06/2019   Recent Labs    08/27/18 1125 08/28/18 0450 12/01/18 1140 12/07/18 0930 04/06/19 1001  NA 132*   < > 140 136 136  K 3.2*   < > 4.3 3.4* 3.7  CL 98   < > 105 105 102  CO2 20*   < > '24 22 25  ' GLUCOSE 136*   < > 100* 139* 130*  BUN 17   < > '17 20 13  ' CREATININE 1.27*   < > 0.73 0.80 0.74  CALCIUM 8.5*   < > 8.9 9.1 8.8*  GFRNONAA 57*   < > >60 >60 >60  GFRAA >60   < > >60 >60 >60  PROT 7.7  --   --  7.5 7.5  ALBUMIN 3.4*  --   --  4.4 4.2  AST 30  --   --  29 34  ALT 16  --   --  21 28  ALKPHOS 136*  --   --  98 115  BILITOT 4.0*  --   --  1.4* 1.4*   < > = values in this interval not displayed.   Hepatitis panel negative.  LDH 228, mildly elevated Beta 2 microglobulin normal.   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 The morphologic and immunophenotypic features are most consistent with chronic lymphocytic leukemia/small lymphocytic lymphoma. Bone marrow Cytogenetics : normal.  Peripheral blood FISH panel negative for CCND1- IGH mutation, ATM, 12, 13q, Tp53 mutation.   RADIOGRAPHIC STUDIES: I have personally reviewed  the radiological images as listed and agreed with the findings in the report. CT angio abd/pelvis showed Type 1b endoleak, AAA aneurysm, Spleen has decreased in size since PET scan in April 2019.   ASSESSMENT & PLAN:  74 y.o. male presents for fol low-up CLL. Clinically doing well.  1. CLL (chronic lymphocytic leukemia) ([place])   2. Fatigue, unspecified type   3. Thrombocytopenia ([place])   4. Long term current use of anticoagulant therapy    #CLL, stable.  Patient has been in remission.  Off treatment since November 2019. 06/30/2018 Peripheral blood flow cytometry negative for immunophenotypic abnormality.  Labs reviewed and discussed with patient. Counts are stable.  Patient is clinically doing well. CLL is in remission.  Chronic anticoagulation for atrial fibrillation, on Xarelto.  He tolerates well.  Continue follow-up with cardiology.  Chronic thrombocytopenia, grade 1, platelet count 1 38,000.  Stable. History of vitamin B12 deficiency, last vitamin B12 level 646.  Chronic anticoagulation for A fib. On Xarelto. Tolerating well. Continue follow up with cardiology.   Thrombocytopenia, stable.  History of B12 deficiency. B12 level is 646.  Fatigue, check TSH.  Check vitamin B12 at the next visit. Hypokalemia, potassium 3.4. advise patient to take potassium enriched food.   Repeat labs 6 months and MD assessment.  Earlie Server, MD, PhD Hematology Oncology [facility] at [facility] Pager- [phone] 04/06/2019

## 2019-04-22 ENCOUNTER — Encounter: Payer: Self-pay | Admitting: Oncology

## 2019-04-27 ENCOUNTER — Ambulatory Visit: Payer: Medicare PPO | Admitting: Occupational Therapy

## 2019-05-03 ENCOUNTER — Other Ambulatory Visit: Payer: Self-pay

## 2019-05-03 ENCOUNTER — Ambulatory Visit: Payer: Medicare PPO | Attending: Student | Admitting: Occupational Therapy

## 2019-05-03 DIAGNOSIS — M6281 Muscle weakness (generalized): Secondary | ICD-10-CM | POA: Diagnosis present

## 2019-05-03 DIAGNOSIS — L905 Scar conditions and fibrosis of skin: Secondary | ICD-10-CM | POA: Diagnosis present

## 2019-05-03 DIAGNOSIS — M25642 Stiffness of left hand, not elsewhere classified: Secondary | ICD-10-CM

## 2019-05-03 NOTE — Therapy (Signed)
[facility] [address], [place], [address] Phone: [phone]   Fax:  [phone]  Occupational Therapy Treatment  Patient Details  Name: Martin Adkins MRN: [id number] Date of Birth: [birth year] Referring Provider (OT): Poggi   Encounter Date: 05/03/2019  OT End of Session - 05/03/19 0918    Visit Number  9    Number of Visits  10    Date for OT Re-Evaluation  05/31/19    OT Start Time  0848    OT Stop Time  0917    OT Time Calculation (min)  29 min    Activity Tolerance  Patient tolerated treatment well    Behavior During Therapy  [facility] for tasks assessed/performed       Past Medical History:  Diagnosis Date  . AAA (abdominal aortic aneurysm) without rupture ([place]) 04/05/2014  . AAA (abdominal aortic aneurysm) without rupture ([place]) 04/05/2014  . Abdominal aortic aneurysm without rupture ([place])   . Acute non-ST elevation myocardial infarction (NSTEMI) ([place]) 08/15/2014  . 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/28/2014  . APS (antiphospholipid syndrome) ([place])   . Arteriosclerosis of coronary artery 04/05/2014   Overview:  Sp cabg with lima to lad svg to d1 om1 rca 2004 with occluded svg to rca and d1 and pci stetn of om1 graft 2015   . Arthritis   . B12 deficiency 03/21/2017  . Barrett's esophagus   . Benign essential HTN 05/02/2014  . BPH (benign prostatic hyperplasia)   . CAD (coronary artery disease)   . CHF (congestive heart failure) ([place])   . Chronic systolic heart failure ([place]) 04/19/2014   Overview:  With segmental LV systolic dysfunction ejection fraction of 35%   . CLL (chronic lymphocytic leukemia) ([place])   . CLL (chronic lymphocytic leukemia) ([place]) 05/24/2017  . DVT (deep venous thrombosis) ([place])   . Dysrhythmia    ATRIAL FIB.  Marland Kitchen GERD (gastroesophageal reflux disease)   . History of hiatal hernia   . History of shingles 2013  . Hyperlipemia   . Hyperplastic polyps of stomach   . Hypertension    . Myocardial infarction ([place]) 07/30/2014  . NSTEMI (non-ST elevated myocardial infarction) ([place])   . OSA (obstructive sleep apnea)    does not use CPAP  . Osteoarthritis   . Pneumonia   . Pre-diabetes   . PVD (peripheral vascular disease) ([place])   . RA (rheumatoid arthritis) ([place])   . SOB (shortness of breath)     Past Surgical History:  Procedure Laterality Date  . ABDOMINAL AORTA STENT    . CHOLECYSTECTOMY    . COLONOSCOPY  11/24/2009  . CORONARY ANGIOPLASTY WITH STENT PLACEMENT  2015  . CORONARY ARTERY BYPASS GRAFT    . 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   Procedure: DUPUYTREN CONTRACTURE RELEASE LEFT LONG FINGER;  Surgeon: Corky Mull, MD;  Location: [facility] ORS;  Service: Orthopedics;  Laterality: Left;  . ENDOVASCULAR REPAIR/STENT GRAFT N/A 09/24/2017   Procedure: ENDOVASCULAR REPAIR/STENT GRAFT;  Surgeon: Algernon Huxley, MD;  Location: [facility];  Service: Cardiovascular;  Laterality: N/A;  . ESOPHAGOGASTRODUODENOSCOPY  05/26/02  03/23/12  . ESOPHAGOGASTRODUODENOSCOPY (EGD) WITH PROPOFOL N/A 10/28/2016   Procedure: ESOPHAGOGASTRODUODENOSCOPY (EGD) WITH PROPOFOL;  Surgeon: Manya Silvas, MD;  Location: [facility] ENDOSCOPY;  Service: Endoscopy;  Laterality: N/A;  . HERNIA REPAIR Left    inguinal  . LEFT HEART CATH AND CORONARY ANGIOGRAPHY N/A 01/19/2018   Procedure: LEFT HEART CATH AND CORONARY ANGIOGRAPHY;  Surgeon: Teodoro Spray, MD;  Location: [facility];  Service: Cardiovascular;  Laterality: N/A;  . PERIPHERAL VASCULAR CATHETERIZATION N/A 09/06/2014   Procedure: IVC Filter Removal;  Surgeon: Katha Cabal, MD;  Location: [facility];  Service: Cardiovascular;  Laterality: N/A;  . TRANSURETHRAL RESECTION OF PROSTATE N/A 02/20/2015   Procedure: TRANSURETHRAL RESECTION OF THE PROSTATE (TURP);  Surgeon: Hollice Espy, MD;  Location: [facility] ORS;  Service: Urology;  Laterality: N/A;    There were no vitals filed for this visit.  Subjective  Assessment - 05/03/19 0916    Subjective   Tenderness is getting better- and I am using it more in the yard and garden - I am using the splint some during day when I am sitting - but need some of that scar pad and sock for finger - still few hard spots    Pertinent History  Pt had L dupuytrens release on 12/09/2018 with stitches removed on 12/21/2018 - pt arrive with some scabs very thick on proximal scar in palm and volar middle phalanges and PIP of 3rd    Patient Stated Goals  I want to get the scar better so I can make fist and open my hand to get my hand in my pocket, pick up and hold objects - and push thru my palm    Currently in Pain?  No/denies         [facility] OT Assessment - 05/03/19 0001      Strength   Right Hand Grip (lbs)  68    Right Hand Lateral Pinch  23 lbs    Right Hand 3 Point Pinch  18 lbs    Left Hand Grip (lbs)  51    Left Hand Lateral Pinch  20 lbs    Left Hand 3 Point Pinch  15 lbs      Left Hand AROM   L Long  MCP 0-90  90 Degrees   -25   L Long PIP 0-100  95 Degrees       Extention cont to maintain -25 at 2rd [facility] - plan to discharge splint at this time for next month with increase use of hand in yard and gardening tasks - and reassess if able to maintain in month  Flexion at PIP 100 during intrinsic fist , 95 with composite as scar tissue on volar 3rd improve  Grip and prehension cont to increase    Replace cica scar pad for night time use and new silicon compression sleeves for 3rd digit for day time and night timeto use next month as needed   Stop splint  And cont scar massage using Mini massager-respond great on scar mobs - still thick onvolar PIP and proximal phalanges between webspace of 3rd/4th- recommend to cont using his mini massager at home( but not over do it)   Can cont prolonged extention stretch for [facility] extention of digits as needed  And pushing into table - 20 reps             OT Education - 05/03/19 0917    Education  Details  HEP change for the next week    Person(s) Educated  Patient    Methods  Explanation;Demonstration;Tactile cues;Verbal cues;Handout    Comprehension  Verbal cues required;Returned demonstration;Verbalized understanding       OT Short Term Goals - 05/03/19 [id number]      OT SHORT TERM GOAL #1   Title  Pt to be independent in scar management to  decrease tenderness and increase AROM with decrease pain    Status  Achieved        OT Long Term Goals - 05/03/19 [id number]      OT LONG TERM GOAL #1   Title  Scar and tenderness improve for pt to tolerate any tectures, tools and pain improve on PRWHE with more than 10 points    Status  Achieved      OT LONG TERM GOAL #2   Title  L digits AROM increase to [facility] to touch palm to increase functional use to more than 80%    Status  Achieved      OT LONG TERM GOAL #3   Title  Pt able to maintain extention of 3rd MC and PIP without splint for month    Baseline  -25 at 3rd [facility] extention    Time  4    Period  Weeks    Status  New    Target Date  05/31/19            Plan - 05/03/19 0919    Clinical Impression Statement  Pt is next week 5 months s/p L dupuytrens release on volar 3rd digit palm into 3rd/4th digit webspace to 3rd PIP - pt cont to maintain digit extention. Grip and prehension strength increase , scar tissue improving - still little thick and keloid at volar 3rd digit- pt to stop splint wearing -and only do some scar massage , strethces at needed and scar pad /silicon digi sleeve on 3rd digit night time - follow  up one more time in month if able to maintain extention without extention splint    OT Occupational Profile and History  Problem Focused Assessment - Including review of records relating to presenting problem    Occupational performance deficits (Please refer to evaluation for details):  ADL's;IADL's;Play;Leisure    Body Structure / Function / Physical Skills  ADL;Flexibility;ROM;UE functional use;Scar  mobility;Edema;Pain;Strength;IADL    Rehab Potential  Good    Clinical Decision Making  Limited treatment options, no task modification necessary    Comorbidities Affecting Occupational Performance:  None    Modification or Assistance to Complete Evaluation   No modification of tasks or assist necessary to complete eval    OT Frequency  Monthly    OT Duration  4 weeks    OT Treatment/Interventions  Self-care/ADL training;Patient/family education;Splinting;Paraffin;Fluidtherapy;Contrast Bath;Manual Therapy;Passive range of motion;Scar mobilization    Plan  assess if maintain ext without extention splint -and if can stop stretches, scar massage    OT Home Exercise Plan  see pt instruction    Consulted and Agree with Plan of Care  Patient       Patient will benefit from skilled therapeutic intervention in order to improve the following deficits and impairments:   Body Structure / Function / Physical Skills: ADL, Flexibility, ROM, UE functional use, Scar mobility, Edema, Pain, Strength, IADL       Visit Diagnosis: Muscle weakness (generalized) - Plan: Ot plan of care cert/re-cert  Scar condition and fibrosis of skin - Plan: Ot plan of care cert/re-cert  Stiffness of left hand, not elsewhere classified - Plan: Ot plan of care cert/re-cert    Problem List Patient Active Problem List   Diagnosis Date Noted  . Sepsis ([place]) 08/27/2018  . Non-ST elevation MI (NSTEMI) ([place]) 01/18/2018  . AAA (abdominal aortic aneurysm) ([place]) 09/24/2017  . Leaking abdominal aortic aneurysm (AAA) ([place]) 08/22/2017  . CLL (chronic lymphocytic leukemia) ([place]) 05/24/2017  .  Goals of care, counseling/discussion 05/24/2017  . B12 deficiency 03/21/2017  . Elevated troponin I level 03/03/2015  . Deep venous thrombosis of profunda femoris vein ([place]) 01/23/2015  . BPH (benign prostatic hyperplasia) 01/23/2015  . Urge urinary incontinence 01/23/2015  . Type 2 diabetes mellitus ([place]) 10/31/2014  . Benign gastric  polyp 10/31/2014  . Absence of bladder continence 10/09/2014  . Acute non-ST elevation myocardial infarction (NSTEMI) ([place]) 08/15/2014  . Atrial fibrillation ([place]) 08/05/2014  . Antiphospholipid syndrome ([place]) 08/01/2014  . H/O deep venous thrombosis 08/01/2014  . Non-ST elevation myocardial infarction (NSTEMI), subendocardial infarction, subsequent episode of care ([place]) 07/30/2014  . Barrett esophagus 07/28/2014  . Diabetes mellitus, type 2 ([place]) 07/28/2014  . 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/28/2014  . Hyperplastic adenomatous polyp of stomach 07/28/2014  . Benign essential HTN 05/02/2014  . Chronic systolic heart failure ([place]) 04/19/2014  . AAA (abdominal aortic aneurysm) without rupture ([place]) 04/05/2014  . Arteriosclerosis of coronary artery 04/05/2014  . Breathlessness on exertion 04/05/2014  . Abdominal aortic aneurysm (AAA) without rupture ([place]) 04/05/2014  . Acute non-ST segment elevation myocardial infarction ([place]) 05/14/2013  . Acid reflux 05/04/2013  . Combined fat and carbohydrate induced hyperlipemia 05/03/2013  . Obstructive apnea 05/03/2013  . Arthritis, degenerative 05/03/2013  . Peripheral blood vessel disorder ([place]) 05/03/2013  . Arthritis or polyarthritis, rheumatoid ([place]) 05/03/2013  . Compulsive tobacco user syndrome 05/03/2013  . Rheumatoid arthritis ([place]) 05/03/2013  . Peripheral vascular disease ([place]) 05/03/2013  . Current tobacco use 05/03/2013    Rosalyn Gess OTR/l,CLT 05/03/2019, 9:26 AM  [facility] [address], [place], [address] Phone: [phone]   Fax:  [phone]  Name: Martin Adkins MRN: [id number] Date of Birth: [birth year]

## 2019-05-03 NOTE — Patient Instructions (Signed)
See note

## 2019-05-28 ENCOUNTER — Other Ambulatory Visit: Payer: Self-pay | Admitting: Internal Medicine

## 2019-05-28 DIAGNOSIS — R519 Headache, unspecified: Secondary | ICD-10-CM

## 2019-05-31 ENCOUNTER — Ambulatory Visit: Payer: Medicare PPO | Admitting: Occupational Therapy

## 2019-06-10 ENCOUNTER — Other Ambulatory Visit: Payer: Self-pay

## 2019-06-10 ENCOUNTER — Ambulatory Visit
Admission: RE | Admit: 2019-06-10 | Discharge: 2019-06-10 | Disposition: A | Discharge status: Home / Self Care | Payer: Medicare PPO | Source: Ambulatory Visit | Attending: Internal Medicine | Admitting: Internal Medicine

## 2019-06-10 DIAGNOSIS — R519 Headache, unspecified: Secondary | ICD-10-CM | POA: Diagnosis present

## 2019-06-21 ENCOUNTER — Ambulatory Visit: Payer: Medicare PPO | Admitting: Occupational Therapy

## 2019-09-06 ENCOUNTER — Other Ambulatory Visit: Payer: Self-pay | Admitting: Neurology

## 2019-09-06 DIAGNOSIS — R200 Anesthesia of skin: Secondary | ICD-10-CM

## 2019-09-06 DIAGNOSIS — G4452 New daily persistent headache (NDPH): Secondary | ICD-10-CM

## 2019-09-06 DIAGNOSIS — R202 Paresthesia of skin: Secondary | ICD-10-CM

## 2019-09-21 ENCOUNTER — Other Ambulatory Visit (INDEPENDENT_AMBULATORY_CARE_PROVIDER_SITE_OTHER): Payer: Self-pay | Admitting: Nurse Practitioner

## 2019-09-21 DIAGNOSIS — I739 Peripheral vascular disease, unspecified: Secondary | ICD-10-CM

## 2019-09-21 DIAGNOSIS — I714 Abdominal aortic aneurysm, without rupture, unspecified: Secondary | ICD-10-CM

## 2019-09-24 ENCOUNTER — Ambulatory Visit (INDEPENDENT_AMBULATORY_CARE_PROVIDER_SITE_OTHER): Payer: Medicare PPO

## 2019-09-24 ENCOUNTER — Ambulatory Visit (INDEPENDENT_AMBULATORY_CARE_PROVIDER_SITE_OTHER): Payer: Medicare PPO | Admitting: Nurse Practitioner

## 2019-09-24 ENCOUNTER — Other Ambulatory Visit: Payer: Self-pay

## 2019-09-24 ENCOUNTER — Encounter (INDEPENDENT_AMBULATORY_CARE_PROVIDER_SITE_OTHER): Payer: Self-pay | Admitting: Nurse Practitioner

## 2019-09-24 VITALS — BP 137/78 | HR 75 | Resp 16 | Wt 204.2 lb

## 2019-09-24 DIAGNOSIS — I714 Abdominal aortic aneurysm, without rupture, unspecified: Secondary | ICD-10-CM

## 2019-09-24 DIAGNOSIS — I739 Peripheral vascular disease, unspecified: Secondary | ICD-10-CM | POA: Diagnosis not present

## 2019-09-24 DIAGNOSIS — I10 Essential (primary) hypertension: Secondary | ICD-10-CM

## 2019-09-25 ENCOUNTER — Ambulatory Visit
Admission: RE | Admit: 2019-09-25 | Discharge: 2019-09-25 | Disposition: A | Discharge status: Home / Self Care | Payer: Medicare PPO | Source: Ambulatory Visit | Attending: Neurology | Admitting: Neurology

## 2019-09-25 DIAGNOSIS — R202 Paresthesia of skin: Secondary | ICD-10-CM | POA: Insufficient documentation

## 2019-09-25 DIAGNOSIS — R200 Anesthesia of skin: Secondary | ICD-10-CM | POA: Diagnosis not present

## 2019-09-25 DIAGNOSIS — G4452 New daily persistent headache (NDPH): Secondary | ICD-10-CM

## 2019-09-25 MED ORDER — GADOBUTROL 1 MMOL/ML IV SOLN
9.0000 mL | Freq: Once | INTRAVENOUS | Status: AC | PRN
  Administered 2019-09-25: 9 mL via INTRAVENOUS

## 2019-09-29 ENCOUNTER — Encounter (INDEPENDENT_AMBULATORY_CARE_PROVIDER_SITE_OTHER): Payer: Self-pay | Admitting: Nurse Practitioner

## 2019-09-29 NOTE — Progress Notes (Signed)
Subjective:    Patient ID: Martin Adkins, male    DOB: December 30, 1945, 74 y.o.   MRN: [id number] Chief Complaint  Patient presents with  . Follow-up    ultrasound followup    The patient returns to the office for surveillance of an abdominal aortic aneurysm status post stent graft placement on 09/24/2017.   Patient denies abdominal pain or back pain, no other abdominal complaints. No groin related complaints. No symptoms consistent with distal embolization No changes in claudication distance.  No interval shortening of the patient's claudication distance or development of rest pain symptoms. No new ulcers or wounds have occurred since the last visit.  There have been no interval changes in his overall healthcare since his last visit.   Patient denies amaurosis fugax or TIA symptoms. There is no history of claudication or rest pain symptoms of the lower extremities. The patient denies angina or shortness of breath.   Duplex US of the aorta and iliac arteries shows a 5.30 AAA sac with no endoleak, no change in the sac compared to the previous study.  ABI Rt=0.87 and Lt=1.0  (previous ABI's Rt=0.77and Lt=1.19) Duplex ultrasound of the right tibial arteries reveals biphasic waveforms with monophasic waveforms in the left lower extremity.  The patient has good toe waveforms bilaterally   Review of Systems  Cardiovascular:       Claudication   Musculoskeletal: Positive for back pain.  All other systems reviewed and are negative.      Objective:   Physical Exam Vitals reviewed.  HENT:     Head: Normocephalic.  Cardiovascular:     Rate and Rhythm: Normal rate and regular rhythm.     Pulses: Normal pulses.  Pulmonary:     Effort: Pulmonary effort is normal.     Breath sounds: Normal breath sounds.  Skin:    General: Skin is warm and dry.  Neurological:     Mental Status: He is alert and oriented to person, place, and time.  Psychiatric:        Mood and Affect: Mood normal.          Behavior: Behavior normal.        Thought Content: Thought content normal.        Judgment: Judgment normal.     BP 137/78 (BP Location: Right Arm)   Pulse 75   Resp 16   Wt 204 lb 3.2 oz (92.6 kg)   BMI 27.69 kg/m   Past Medical History:  Diagnosis Date  . AAA (abdominal aortic aneurysm) without rupture ([place]) 04/05/2014  . AAA (abdominal aortic aneurysm) without rupture ([place]) 04/05/2014  . Abdominal aortic aneurysm without rupture ([place])   . Acute non-ST elevation myocardial infarction (NSTEMI) ([place]) 08/15/2014  . 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/28/2014  . APS (antiphospholipid syndrome) ([place])   . Arteriosclerosis of coronary artery 04/05/2014   Overview:  Sp cabg with lima to lad svg to d1 om1 rca 2004 with occluded svg to rca and d1 and pci stetn of om1 graft 2015   . Arthritis   . B12 deficiency 03/21/2017  . Barrett's esophagus   . Benign essential HTN 05/02/2014  . BPH (benign prostatic hyperplasia)   . CAD (coronary artery disease)   . CHF (congestive heart failure) ([place])   . Chronic systolic heart failure ([place]) 04/19/2014   Overview:  With segmental LV systolic dysfunction ejection fraction of 35%   . CLL (chronic lymphocytic leukemia) ([place])   . CLL (chronic lymphocytic leukemia) ([place]) 05/24/2017  .  DVT (deep venous thrombosis) ([place])   . Dysrhythmia    ATRIAL FIB.  Marland Kitchen GERD (gastroesophageal reflux disease)   . History of hiatal hernia   . History of shingles 2013  . Hyperlipemia   . Hyperplastic polyps of stomach   . Hypertension   . Myocardial infarction ([place]) 07/30/2014  . NSTEMI (non-ST elevated myocardial infarction) ([place])   . OSA (obstructive sleep apnea)    does not use CPAP  . Osteoarthritis   . Pneumonia   . Pre-diabetes   . PVD (peripheral vascular disease) ([place])   . RA (rheumatoid arthritis) ([place])   . SOB (shortness of breath)     Social History   Socioeconomic History  . Marital status: Legally Separated    Spouse name: Not on file  . Number  of children: Not on file  . Years of education: Not on file  . Highest education level: Not on file  Occupational History  . Not on file  Tobacco Use  . Smoking status: Current Some Day Smoker    Packs/day: 0.50    Years: 20.00    Pack years: 10.00    Types: Cigarettes  . Smokeless tobacco: Never Used  . Tobacco comment: sometimes 2-3 a day  Vaping Use  . Vaping Use: Never used  Substance and Sexual Activity  . Alcohol use: Yes    Alcohol/week: 1.0 - 2.0 standard drink    Types: 1 - 2 Shots of liquor per week  . Drug use: No  . Sexual activity: Never  Other Topics Concern  . Not on file  Social History Narrative  . Not on file   Social Determinants of Health   Financial Resource Strain:   . Difficulty of Paying Living Expenses: Not on file  Food Insecurity:   . Worried About Charity fundraiser in the Last Year: Not on file  . Ran Out of Food in the Last Year: Not on file  Transportation Needs:   . Lack of Transportation (Medical): Not on file  . Lack of Transportation (Non-Medical): Not on file  Physical Activity:   . Days of Exercise per Week: Not on file  . Minutes of Exercise per Session: Not on file  Stress:   . Feeling of Stress : Not on file  Social Connections:   . Frequency of Communication with Friends and Family: Not on file  . Frequency of Social Gatherings with Friends and Family: Not on file  . Attends Religious Services: Not on file  . Active Member of Clubs or Organizations: Not on file  . Attends Archivist Meetings: Not on file  . Marital Status: Not on file  Intimate Partner Violence:   . Fear of Current or Ex-Partner: Not on file  . Emotionally Abused: Not on file  . Physically Abused: Not on file  . Sexually Abused: Not on file    Past Surgical History:  Procedure Laterality Date  . ABDOMINAL AORTA STENT    . CHOLECYSTECTOMY    . COLONOSCOPY  11/24/2009  . CORONARY ANGIOPLASTY WITH STENT PLACEMENT  2015  . CORONARY ARTERY  BYPASS GRAFT    . 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   Procedure: DUPUYTREN CONTRACTURE RELEASE LEFT LONG FINGER;  Surgeon: Corky Mull, MD;  Location: [facility] ORS;  Service: Orthopedics;  Laterality: Left;  . ENDOVASCULAR REPAIR/STENT GRAFT N/A 09/24/2017   Procedure: ENDOVASCULAR REPAIR/STENT GRAFT;  Surgeon: Algernon Huxley, MD;  Location: [facility] [facility];  Service: Cardiovascular;  Laterality: N/A;  . ESOPHAGOGASTRODUODENOSCOPY  05/26/02  03/23/12  . ESOPHAGOGASTRODUODENOSCOPY (EGD) WITH PROPOFOL N/A 10/28/2016   Procedure: ESOPHAGOGASTRODUODENOSCOPY (EGD) WITH PROPOFOL;  Surgeon: Manya Silvas, MD;  Location[facility];  Service: Endoscopy;  Laterality: N/A;  . HERNIA REPAIR Left    inguinal  . LEFT HEART CATH AND CORONARY ANGIOGRAPHY N/A 01/19/2018   Procedure: LEFT HEART CATH AND CORONARY ANGIOGRAPHY;  Surgeon: Teodoro Spray, MD;  Location: [place] [facility];  Service: Cardiovascular;  Laterality: N/A;  . PERIPHERAL VASCULAR CATHETERIZATION N/A 09/06/2014   Procedure: IVC Filter Removal;  Surgeon: Katha Cabal, MD;  Location: [place] [facility];  Service: Cardiovascular;  Laterality: N/A;  . TRANSURETHRAL RESECTION OF PROSTATE N/A 02/20/2015   Procedure: TRANSURETHRAL RESECTION OF THE PROSTATE (TURP);  Surgeon: Hollice Espy, MD;  Location: [facility] ORS;  Service: Urology;  Laterality: N/A;    Family History  Problem Relation Age of Onset  . Cancer Mother 30       breast ca  . Diabetes Mother   . Coronary artery disease Father   . Heart attack Father   . Prostate cancer Brother   . Bladder Cancer Neg Hx   . Kidney cancer Neg Hx     Allergies  Allergen Reactions  . Ace Inhibitors Other (See Comments)    Other reaction(s): Cough  . Pravastatin Other (See Comments)    Other reaction(s): Muscle Pain  . Rosuvastatin Other (See Comments)    Other reaction(s): Other (See Comments) GI bleed  . Simvastatin Other (See Comments)    Other reaction(s): Muscle  Pain  . Amoxicillin Rash    Has patient had a PCN reaction causing immediate rash, facial/tongue/throat swelling, SOB or lightheadedness with hypotension: Yes Has patient had a PCN reaction causing severe rash involving mucus membranes or skin necrosis: Unknown Has patient had a PCN reaction that required hospitalization: Unknown Has patient had a PCN reaction occurring within the last 10 years: No If all of the above answers are "NO", then may proceed with Cephalosporin use.  . Niacin Rash  . Penicillins Rash    Has patient had a PCN reaction causing immediate rash, facial/tongue/throat swelling, SOB or lightheadedness with hypotension: Yes Has patient had a PCN reaction causing severe rash involving mucus membranes or skin necrosis: Unknown Has patient had a PCN reaction that required hospitalization: Unknown Has patient had a PCN reaction occurring within the last 10 years: No If all of the above answers are "NO", then may proceed with Cephalosporin use.       Assessment & Plan:   1. AAA (abdominal aortic aneurysm) without rupture (HCC) Recommend: Patient is status post successful endovascular repair of the AAA.   No further intervention is required at this time.   No endoleak is detected and the aneurysm sac is stable.  The patient will continue antiplatelet therapy as prescribed as well as aggressive management of hyperlipidemia. Exercise is again strongly encouraged.   However, endografts require continued surveillance with ultrasound or CT scan. This is mandatory to detect any changes that allow repressurization of the aneurysm sac.  The patient is informed that this would be asymptomatic.  The patient is reminded that lifelong routine surveillance is a necessity with an endograft. Patient will continue to follow-up at 12 month intervals with ultrasound of the aorta.  2. Benign essential HTN Continue antihypertensive medications as already ordered, these medications have been  reviewed and there are no changes at this time.   3. Peripheral vascular  disease [facility])  Recommend:  The patient has evidence of atherosclerosis of the lower extremities with claudication.  The patient does not voice lifestyle limiting changes at this point in time.  Noninvasive studies do not suggest clinically significant change.  No invasive studies, angiography or surgery at this time The patient should continue walking and begin a more formal exercise program.  The patient should continue antiplatelet therapy and aggressive treatment of the lipid abnormalities  No changes in the patient's medications at this time  The patient should continue wearing graduated compression socks 10-15 mmHg strength to control the mild edema.     Current Outpatient Medications on File Prior to Visit  Medication Sig Dispense Refill  . albuterol (PROVENTIL HFA;VENTOLIN HFA) 108 (90 Base) MCG/ACT inhaler Inhale 2 puffs into the lungs every 6 (six) hours as needed for wheezing or shortness of breath. 1 Inhaler 0  . amLODipine (NORVASC) 10 MG tablet Take 10 mg by mouth daily.     Marland Kitchen atorvastatin (LIPITOR) 80 MG tablet Take 80 mg by mouth daily.   11  . baclofen (LIORESAL) 10 MG tablet     . dorzolamide (TRUSOPT) 2 % ophthalmic solution Place 1 drop into both eyes 2 (two) times daily.     . ergocalciferol (VITAMIN D2) 1.25 MG (50000 UT) capsule Take by mouth.    . fluticasone (FLONASE) 50 MCG/ACT nasal spray Place 2 sprays into both nostrils daily.     Marland Kitchen gabapentin (NEURONTIN) 300 MG capsule Take 1 capsule (300 mg total) by mouth 3 (three) times daily. (Patient taking differently: Take 300 mg by mouth 2 (two) times daily. ) 90 capsule 0  . isosorbide mononitrate (IMDUR) 60 MG 24 hr tablet Take 1 tablet (60 mg total) by mouth daily. 30 tablet 0  . latanoprost (XALATAN) 0.005 % ophthalmic solution Place 1 drop into both eyes at bedtime.     . metoprolol succinate (TOPROL-XL) 100 MG 24 hr tablet Take 100 mg by  mouth daily.     . pantoprazole (PROTONIX) 40 MG tablet Take 40 mg by mouth daily.     . potassium chloride SA (K-DUR,KLOR-CON) 20 MEQ tablet Take 20 mEq by mouth daily.     . rivaroxaban (XARELTO) 20 MG TABS tablet Take 1 tablet (20 mg total) by mouth daily with supper. 30 tablet 3  . vitamin B-12 (CYANOCOBALAMIN) 1000 MCG tablet     . HYDROcodone-acetaminophen (NORCO) 5-325 MG tablet Take 1-2 tablets by mouth every 6 (six) hours as needed for moderate pain. MAXIMUM TOTAL ACETAMINOPHEN DOSE IS 4000 MG PER DAY (Patient not taking: Reported on 09/24/2019) 30 tablet 0  . Ipratropium-Albuterol (COMBIVENT) 20-100 MCG/ACT AERS respimat Inhale 1 puff into the lungs every 6 (six) hours. (Patient not taking: Reported on 09/24/2019) 1 Inhaler 11   No current facility-administered medications on file prior to visit.    There are no Patient Instructions on file for this visit. No follow-ups on file.   Kris Hartmann, NP

## 2019-10-07 ENCOUNTER — Encounter: Payer: Self-pay | Admitting: Oncology

## 2019-10-07 ENCOUNTER — Other Ambulatory Visit: Payer: Self-pay

## 2019-10-07 ENCOUNTER — Inpatient Hospital Stay: Payer: Medicare PPO | Attending: Oncology

## 2019-10-07 ENCOUNTER — Inpatient Hospital Stay (HOSPITAL_BASED_OUTPATIENT_CLINIC_OR_DEPARTMENT_OTHER): Payer: Medicare PPO | Admitting: Oncology

## 2019-10-07 VITALS — BP 120/76 | HR 47 | Temp 96.9°F | Resp 18 | Wt 205.7 lb

## 2019-10-07 DIAGNOSIS — E538 Deficiency of other specified B group vitamins: Secondary | ICD-10-CM | POA: Insufficient documentation

## 2019-10-07 DIAGNOSIS — D696 Thrombocytopenia, unspecified: Secondary | ICD-10-CM

## 2019-10-07 DIAGNOSIS — I714 Abdominal aortic aneurysm, without rupture: Secondary | ICD-10-CM | POA: Diagnosis not present

## 2019-10-07 DIAGNOSIS — R001 Bradycardia, unspecified: Secondary | ICD-10-CM | POA: Insufficient documentation

## 2019-10-07 DIAGNOSIS — G4733 Obstructive sleep apnea (adult) (pediatric): Secondary | ICD-10-CM | POA: Insufficient documentation

## 2019-10-07 DIAGNOSIS — Z803 Family history of malignant neoplasm of breast: Secondary | ICD-10-CM | POA: Insufficient documentation

## 2019-10-07 DIAGNOSIS — Z7901 Long term (current) use of anticoagulants: Secondary | ICD-10-CM

## 2019-10-07 DIAGNOSIS — K219 Gastro-esophageal reflux disease without esophagitis: Secondary | ICD-10-CM | POA: Insufficient documentation

## 2019-10-07 DIAGNOSIS — Z79899 Other long term (current) drug therapy: Secondary | ICD-10-CM | POA: Diagnosis not present

## 2019-10-07 DIAGNOSIS — Z8042 Family history of malignant neoplasm of prostate: Secondary | ICD-10-CM | POA: Insufficient documentation

## 2019-10-07 DIAGNOSIS — Z86718 Personal history of other venous thrombosis and embolism: Secondary | ICD-10-CM | POA: Diagnosis not present

## 2019-10-07 DIAGNOSIS — I252 Old myocardial infarction: Secondary | ICD-10-CM | POA: Diagnosis not present

## 2019-10-07 DIAGNOSIS — K760 Fatty (change of) liver, not elsewhere classified: Secondary | ICD-10-CM | POA: Insufficient documentation

## 2019-10-07 DIAGNOSIS — C9110 Chronic lymphocytic leukemia of B-cell type not having achieved remission: Secondary | ICD-10-CM

## 2019-10-07 DIAGNOSIS — R161 Splenomegaly, not elsewhere classified: Secondary | ICD-10-CM | POA: Insufficient documentation

## 2019-10-07 DIAGNOSIS — I4891 Unspecified atrial fibrillation: Secondary | ICD-10-CM | POA: Diagnosis not present

## 2019-10-07 DIAGNOSIS — R5383 Other fatigue: Secondary | ICD-10-CM

## 2019-10-07 DIAGNOSIS — M069 Rheumatoid arthritis, unspecified: Secondary | ICD-10-CM | POA: Insufficient documentation

## 2019-10-07 DIAGNOSIS — R51 Headache with orthostatic component, not elsewhere classified: Secondary | ICD-10-CM | POA: Insufficient documentation

## 2019-10-07 DIAGNOSIS — K2270 Barrett's esophagus without dysplasia: Secondary | ICD-10-CM | POA: Insufficient documentation

## 2019-10-07 DIAGNOSIS — I739 Peripheral vascular disease, unspecified: Secondary | ICD-10-CM | POA: Insufficient documentation

## 2019-10-07 DIAGNOSIS — I10 Essential (primary) hypertension: Secondary | ICD-10-CM | POA: Insufficient documentation

## 2019-10-07 DIAGNOSIS — I509 Heart failure, unspecified: Secondary | ICD-10-CM | POA: Diagnosis not present

## 2019-10-07 DIAGNOSIS — I2510 Atherosclerotic heart disease of native coronary artery without angina pectoris: Secondary | ICD-10-CM | POA: Diagnosis not present

## 2019-10-07 DIAGNOSIS — D3501 Benign neoplasm of right adrenal gland: Secondary | ICD-10-CM | POA: Diagnosis not present

## 2019-10-07 DIAGNOSIS — F17210 Nicotine dependence, cigarettes, uncomplicated: Secondary | ICD-10-CM | POA: Diagnosis not present

## 2019-10-07 DIAGNOSIS — R634 Abnormal weight loss: Secondary | ICD-10-CM | POA: Diagnosis not present

## 2019-10-07 LAB — CBC WITH DIFFERENTIAL/PLATELET
Abs Immature Granulocytes: 0.02 10*3/uL (ref 0.00–0.07)
Basophils Absolute: 0 10*3/uL (ref 0.0–0.1)
Basophils Relative: 1 %
Eosinophils Absolute: 0 10*3/uL (ref 0.0–0.5)
Eosinophils Relative: 1 %
HCT: 44.9 % (ref 39.0–52.0)
Hemoglobin: 15.6 g/dL (ref 13.0–17.0)
Immature Granulocytes: 0 %
Lymphocytes Relative: 24 %
Lymphs Abs: 1.1 10*3/uL (ref 0.7–4.0)
MCH: 32.1 pg (ref 26.0–34.0)
MCHC: 34.7 g/dL (ref 30.0–36.0)
MCV: 92.4 fL (ref 80.0–100.0)
Monocytes Absolute: 0.4 10*3/uL (ref 0.1–1.0)
Monocytes Relative: 8 %
Neutro Abs: 3.1 10*3/uL (ref 1.7–7.7)
Neutrophils Relative %: 66 %
Platelets: 139 10*3/uL — ABNORMAL LOW (ref 150–400)
RBC: 4.86 MIL/uL (ref 4.22–5.81)
RDW: 14.3 % (ref 11.5–15.5)
WBC: 4.6 10*3/uL (ref 4.0–10.5)
nRBC: 0 % (ref 0.0–0.2)

## 2019-10-07 LAB — COMPREHENSIVE METABOLIC PANEL
ALT: 37 U/L (ref 0–44)
AST: 40 U/L (ref 15–41)
Albumin: 4.5 g/dL (ref 3.5–5.0)
Alkaline Phosphatase: 91 U/L (ref 38–126)
Anion gap: 10 (ref 5–15)
BUN: 13 mg/dL (ref 8–23)
CO2: 27 mmol/L (ref 22–32)
Calcium: 9.3 mg/dL (ref 8.9–10.3)
Chloride: 103 mmol/L (ref 98–111)
Creatinine, Ser: 0.94 mg/dL (ref 0.61–1.24)
GFR calc Af Amer: >60 mL/min (ref >60)
GFR calc non Af Amer: >60 mL/min (ref >60)
Glucose, Bld: 136 mg/dL — ABNORMAL HIGH (ref 70–99)
Potassium: 3.8 mmol/L (ref 3.5–5.1)
Sodium: 140 mmol/L (ref 135–145)
Total Bilirubin: 1.5 mg/dL — ABNORMAL HIGH (ref 0.3–1.2)
Total Protein: 7.6 g/dL (ref 6.5–8.1)

## 2019-10-07 LAB — LACTATE DEHYDROGENASE: LDH: 143 U/L (ref 98–192)

## 2019-10-07 LAB — VITAMIN B12: Vitamin B-12: 633 pg/mL (ref 180–914)

## 2019-10-07 NOTE — Progress Notes (Signed)
Pt here for follow up. Reports he was having dizzy spells, headaches and blurry vision and was put on vitamin D and multivitamin by neurologist. He still having work up done regarding these issues.

## 2019-10-07 NOTE — Progress Notes (Signed)
Hematology/Oncology Follow up note [facility] - Inpatient [phone] [phone]   Patient Care Team: [name], MD as PCP - General (Internal Medicine) Earlie Server, MD as Medical Oncologist (Medical Oncology)  REFERRING PROVIDER: [name], MD  REASON FOR VISIT Follow up for management of CLL HISTORY OF PRESENTING ILLNESS:  Martin Adkins is a  74 y.o.  male with PMH listed below who was referred to me for evaluation of lymphocytosis.  Recent lab work on 03/05/2017 showed wbc 11.4, hb 14.6, platelet count 117,000, lymphocytosis 63.5%, neutrophil 22%, Report chronic fatigue, no weight loss, fever or chills.  He reports feeling chest "rattleness". Has for CT chest which showed acute finding, chronic finding includes  postsurgical changes consistent with coronary bypass grafting. One of the bypass grafts arising from the aorta shows apparent thrombosis and an area of distal aneurysmal dilatation which is also Thrombosed. Right adrenal adenoma stable in appearance.Mild scarring in the lung bases.Fatty liver.   Takes Aspirin 55m, coumadine, plavix. He is on anticoagulation for previous history of clots.  Denies any bleeding events. Use to drink hard liquir daily, quitted for 4-5 years. Current drinks wine on weekend.    Image Studies # 04/04/2017  [place]dom[place] showed 1.  Splenomegaly.  No focal splenic lesions evident.2. Increased liver echogenicity, a finding most likely indicative of hepatic steatosis. While no focal liver lesions are evident on this study, it must be cautioned that the sensitivity of ultrasound for detection of focal liver lesions is diminished in this circumstance.3. Pancreas obscured by gas. Portions of inferior vena cava obscured by gas.4.  Gallbladder absent.5. Status post abdominal aortic aneurysm repair. No periaortic fluid.  # 04/21/2017 PET scan 1. Moderate splenomegaly with uniform splenic hypermetabolism,compatible  with the provided history of lymphoproliferative disease.No additional hypermetabolic sites of lymphoproliferative disease. Specifically, no hypermetabolic lymphadenopathy Hepatic steatosis, aortic atherosclerosis. Aneurysm.    # CLL, stage IV disease given spelencemagly, thrombocytopenia, symptomatic with fatigue and weight loss.  CLL IPL score 4, high risk.   # status post abdominal aortic aneurysm repair on 09/25/2017 s/p cardiac cath which revealed patent LIMA to the LAD, no significant disease in the RCA with a 90% stenosis in a small RV marginal branch.  Occluded left circumflex, occluded LAD.  Saphenous vein grafts are all occluded.  RCA does not require grafting.  LAD is well grafted by the left internal mammary.  Medical management  INTERVAL HISTORY Martin JJAIDEN DINKINSis a 74y.o. male who has above presents for assessing toxicities from Venetoclax for treatment for CLL.  He has been off venetoclax since 11/11/2017. Patient reports that he has dizzy spells recently.  Also headache.  Headache has improved.  He also had an  MRI brain which showed no acute intracranial abnormality. Otherwise he feels well.  He has good appetite.  Weight has been stable.  Denies any night sweats, fever or chills. No recent infection or hospitalization.  . Review of Systems  Constitutional: Negative for appetite change, chills, fatigue, fever and unexpected weight change.  HENT:   Negative for hearing loss and voice change.   Eyes: Negative for eye problems and icterus.  Respiratory: Negative for chest tightness, cough, shortness of breath and wheezing.   Cardiovascular: Negative for chest pain and leg swelling.  Gastrointestinal: Negative for abdominal distention and abdominal pain.  Endocrine: Negative for hot flashes.  Genitourinary: Negative for difficulty urinating, dysuria and frequency.   Musculoskeletal: Negative for arthralgias and back pain.  Skin: Negative  for itching and rash.  Neurological:  Positive for dizziness and headaches. Negative for light-headedness and numbness.  Hematological: Negative for adenopathy. Does not bruise/bleed easily.  Psychiatric/Behavioral: Negative for confusion.     MEDICAL HISTORY:  Past Medical History:  Diagnosis Date  . AAA (abdominal aortic aneurysm) without rupture ([place]) 04/05/2014  . AAA (abdominal aortic aneurysm) without rupture ([place]) 04/05/2014  . Abdominal aortic aneurysm without rupture ([place])   . Acute non-ST elevation myocardial infarction (NSTEMI) ([place]) 08/15/2014  . 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/28/2014  . APS (antiphospholipid syndrome) ([place])   . Arteriosclerosis of coronary artery 04/05/2014   Overview:  Sp cabg with lima to lad svg to d1 om1 rca 2004 with occluded svg to rca and d1 and pci stetn of om1 graft 2015   . Arthritis   . B12 deficiency 03/21/2017  . Barrett's esophagus   . Benign essential HTN 05/02/2014  . BPH (benign prostatic hyperplasia)   . CAD (coronary artery disease)   . CHF (congestive heart failure) ([place])   . Chronic systolic heart failure ([place]) 04/19/2014   Overview:  With segmental LV systolic dysfunction ejection fraction of 35%   . CLL (chronic lymphocytic leukemia) ([place])   . CLL (chronic lymphocytic leukemia) ([place]) 05/24/2017  . DVT (deep venous thrombosis) ([place])   . Dysrhythmia    ATRIAL FIB.  Marland Kitchen GERD (gastroesophageal reflux disease)   . History of hiatal hernia   . History of shingles 2013  . Hyperlipemia   . Hyperplastic polyps of stomach   . Hypertension   . Myocardial infarction ([place]) 07/30/2014  . NSTEMI (non-ST elevated myocardial infarction) ([place])   . OSA (obstructive sleep apnea)    does not use CPAP  . Osteoarthritis   . Pneumonia   . Pre-diabetes   . PVD (peripheral vascular disease) ([place])   . RA (rheumatoid arthritis) ([place])   . SOB (shortness of breath)     SURGICAL HISTORY: Past Surgical History:  Procedure Laterality Date  . ABDOMINAL AORTA STENT    . CHOLECYSTECTOMY    .  COLONOSCOPY  11/24/2009  . CORONARY ANGIOPLASTY WITH STENT PLACEMENT  2015  . CORONARY ARTERY BYPASS GRAFT    . 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   Procedure: DUPUYTREN CONTRACTURE RELEASE LEFT LONG FINGER;  Surgeon: Corky Mull, MD;  Location: [facility] ORS;  Service: Orthopedics;  Laterality: Left;  . ENDOVASCULAR REPAIR/STENT GRAFT N/A 09/24/2017   Procedure: ENDOVASCULAR REPAIR/STENT GRAFT;  Surgeon: Algernon Huxley, MD;  Location: [place] [facility];  Service: Cardiovascular;  Laterality: N/A;  . ESOPHAGOGASTRODUODENOSCOPY  05/26/02  03/23/12  . ESOPHAGOGASTRODUODENOSCOPY (EGD) WITH PROPOFOL N/A 10/28/2016   Procedure: ESOPHAGOGASTRODUODENOSCOPY (EGD) WITH PROPOFOL;  Surgeon: Manya Silvas, MD;  Location: [facility] ENDOSCOPY;  Service: Endoscopy;  Laterality: N/A;  . HERNIA REPAIR Left    inguinal  . LEFT HEART CATH AND CORONARY ANGIOGRAPHY N/A 01/19/2018   Procedure: LEFT HEART CATH AND CORONARY ANGIOGRAPHY;  Surgeon: Teodoro Spray, MD;  Location: [place] [facility];  Service: Cardiovascular;  Laterality: N/A;  . PERIPHERAL VASCULAR CATHETERIZATION N/A 09/06/2014   Procedure: IVC Filter Removal;  Surgeon: Katha Cabal, MD;  Location: [place] [facility];  Service: Cardiovascular;  Laterality: N/A;  . TRANSURETHRAL RESECTION OF PROSTATE N/A 02/20/2015   Procedure: TRANSURETHRAL RESECTION OF THE PROSTATE (TURP);  Surgeon: Hollice Espy, MD;  Location: [facility] ORS;  Service: Urology;  Laterality: N/A;    SOCIAL HISTORY: Social History   Socioeconomic History  . Marital status: Legally  Separated    Spouse name: Not on file  . Number of children: Not on file  . Years of education: Not on file  . Highest education level: Not on file  Occupational History  . Not on file  Tobacco Use  . Smoking status: Current Some Day Smoker    Packs/day: 0.50    Years: 20.00    Pack years: 10.00    Types: Cigarettes  . Smokeless tobacco: Never Used  . Tobacco comment: sometimes 2-3 a day   Vaping Use  . Vaping Use: Never used  Substance and Sexual Activity  . Alcohol use: Yes    Alcohol/week: 1.0 - 2.0 standard drink    Types: 1 - 2 Shots of liquor per week  . Drug use: No  . Sexual activity: Never  Other Topics Concern  . Not on file  Social History Narrative  . Not on file   Social Determinants of Health   Financial Resource Strain:   . Difficulty of Paying Living Expenses: Not on file  Food Insecurity:   . Worried About Charity fundraiser in the Last Year: Not on file  . Ran Out of Food in the Last Year: Not on file  Transportation Needs:   . Lack of Transportation (Medical): Not on file  . Lack of Transportation (Non-Medical): Not on file  Physical Activity:   . Days of Exercise per Week: Not on file  . Minutes of Exercise per Session: Not on file  Stress:   . Feeling of Stress : Not on file  Social Connections:   . Frequency of Communication with Friends and Family: Not on file  . Frequency of Social Gatherings with Friends and Family: Not on file  . Attends Religious Services: Not on file  . Active Member of Clubs or Organizations: Not on file  . Attends Archivist Meetings: Not on file  . Marital Status: Not on file  Intimate Partner Violence:   . Fear of Current or Ex-Partner: Not on file  . Emotionally Abused: Not on file  . Physically Abused: Not on file  . Sexually Abused: Not on file    FAMILY HISTORY: Family History  Problem Relation Age of Onset  . Cancer Mother 6       breast ca  . Diabetes Mother   . Coronary artery disease Father   . Heart attack Father   . Prostate cancer Brother   . Bladder Cancer Neg Hx   . Kidney cancer Neg Hx     ALLERGIES:  is allergic to ace inhibitors, pravastatin, rosuvastatin, simvastatin, amoxicillin, niacin, and penicillins.  MEDICATIONS:  Current Outpatient Medications  Medication Sig Dispense Refill  . albuterol (PROVENTIL HFA;VENTOLIN HFA) 108 (90 Base) MCG/ACT inhaler Inhale 2  puffs into the lungs every 6 (six) hours as needed for wheezing or shortness of breath. 1 Inhaler 0  . amLODipine (NORVASC) 10 MG tablet Take 10 mg by mouth daily.     Marland Kitchen atorvastatin (LIPITOR) 80 MG tablet Take 80 mg by mouth daily.   11  . baclofen (LIORESAL) 10 MG tablet     . dorzolamide (TRUSOPT) 2 % ophthalmic solution Place 1 drop into both eyes 2 (two) times daily.     . ergocalciferol (VITAMIN D2) 1.25 MG (50000 UT) capsule Take by mouth.    . fluticasone (FLONASE) 50 MCG/ACT nasal spray Place 2 sprays into both nostrils daily.     Marland Kitchen gabapentin (NEURONTIN) 300 MG capsule Take 1 capsule (  300 mg total) by mouth 3 (three) times daily. (Patient taking differently: Take 300 mg by mouth 2 (two) times daily. ) 90 capsule 0  . HYDROcodone-acetaminophen (NORCO) 5-325 MG tablet Take 1-2 tablets by mouth every 6 (six) hours as needed for moderate pain. MAXIMUM TOTAL ACETAMINOPHEN DOSE IS 4000 MG PER DAY 30 tablet 0  . Ipratropium-Albuterol (COMBIVENT) 20-100 MCG/ACT AERS respimat Inhale 1 puff into the lungs every 6 (six) hours. 1 Inhaler 11  . isosorbide mononitrate (IMDUR) 60 MG 24 hr tablet Take 1 tablet (60 mg total) by mouth daily. 30 tablet 0  . latanoprost (XALATAN) 0.005 % ophthalmic solution Place 1 drop into both eyes at bedtime.     . metoprolol succinate (TOPROL-XL) 100 MG 24 hr tablet Take 100 mg by mouth daily.     . pantoprazole (PROTONIX) 40 MG tablet Take 40 mg by mouth daily.     . potassium chloride SA (K-DUR,KLOR-CON) 20 MEQ tablet Take 20 mEq by mouth daily.     . rivaroxaban (XARELTO) 20 MG TABS tablet Take 1 tablet (20 mg total) by mouth daily with supper. 30 tablet 3  . vitamin B-12 (CYANOCOBALAMIN) 1000 MCG tablet      No current facility-administered medications for this visit.     PHYSICAL EXAMINATION: ECOG 1 Vitals:   10/07/19 1012  BP: 120/76  Pulse: (!) 47  Resp: 18  Temp: (!) 96.9 F (36.1 C)  SpO2: 99%  Physical Exam Vitals and nursing note reviewed.   Constitutional:      General: He is not in acute distress.    Appearance: He is not diaphoretic.  HENT:     Head: Normocephalic and atraumatic.     Nose: Nose normal.     Mouth/Throat:     Pharynx: No oropharyngeal exudate.  Eyes:     General: No scleral icterus.       Left eye: No discharge.     Pupils: Pupils are equal, round, and reactive to light.  Neck:     Vascular: No JVD.  Cardiovascular:     Rate and Rhythm: Normal rate and regular rhythm.     Heart sounds: Normal heart sounds. No murmur heard.   Pulmonary:     Effort: Pulmonary effort is normal. No respiratory distress.     Breath sounds: No wheezing or rales.  Chest:     Chest wall: No tenderness.  Abdominal:     General: There is no distension.     Palpations: Abdomen is soft.     Tenderness: There is no abdominal tenderness.  Musculoskeletal:        General: No deformity. Normal range of motion.     Cervical back: Normal range of motion and neck supple.  Lymphadenopathy:     Cervical: No cervical adenopathy.  Skin:    General: Skin is warm and dry.     Findings: No erythema.  Neurological:     Mental Status: He is alert and oriented to person, place, and time.     Cranial Nerves: No cranial nerve deficit.     Motor: No abnormal muscle tone.     Coordination: Coordination normal.  Psychiatric:        Mood and Affect: Affect normal.      LABORATORY DATA:  I have reviewed the data as listed Lab Results  Component Value Date   WBC 4.6 10/07/2019   HGB 15.6 10/07/2019   HCT 44.9 10/07/2019   MCV 92.4 10/07/2019   PLT  139 (L) 10/07/2019   Recent Labs    12/07/18 0930 04/06/19 1001 10/07/19 0924  NA 136 136 140  K 3.4* 3.7 3.8  CL 105 102 103  CO2 '22 25 27  ' GLUCOSE 139* 130* 136*  BUN '20 13 13  ' CREATININE 0.80 0.74 0.94  CALCIUM 9.1 8.8* 9.3  GFRNONAA >60 >60 >60  GFRAA >60 >60 >60  PROT 7.5 7.5 7.6  ALBUMIN 4.4 4.2 4.5  AST 29 34 40  ALT 21 28 37  ALKPHOS 98 115 91  BILITOT 1.4*  1.4* 1.5*   Hepatitis panel negative.  LDH 228, mildly elevated Beta 2 microglobulin normal.   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 The morphologic and immunophenotypic features are most consistent with chronic lymphocytic leukemia/small lymphocytic lymphoma. Bone marrow Cytogenetics : normal.  Peripheral blood FISH panel negative for CCND1- IGH mutation, ATM, 12, 13q, Tp53 mutation.   RADIOGRAPHIC STUDIES: I have personally reviewed the radiological images as listed and agreed with the findings in the report. CT angio abd/pelvis showed Type 1b endoleak, AAA aneurysm, Spleen has decreased in size since PET scan in April 2019.   ASSESSMENT & PLAN:  74 y.o. male presents for fol low-up CLL. Clinically doing well.  1. CLL (chronic lymphocytic leukemia) ([place])   2. Long term current use of anticoagulant therapy   3. Thrombocytopenia ([place])   4. Bradycardia    #CLL, stable.  Patient has been in remission.  Off treatment since November 2019. 06/30/2018 Peripheral blood flow cytometry negative for immunophenotypic abnormality.  Repeat flow cytometry. Clinically he is doing satisfactorily. Peripheral blood counts are stable.  Labs are reviewed and discussed with patient.  #Dizzy spells, Currently he is getting work-up with neurology. I noticed that his heart rate is less than 50 and patient is on beta-blockers.  I wonder if the symptoms are related to symptomatic bradycardia.  Recommend patient to check with cardiology and primary care provider.  Monitor heart rate and blood pressure.   #Atrial fibrillation, on chronic anticoagulation with Xarelto.  Follow-up with cardiology. #Thrombocytopenia, stable.  History of B12 deficiency. B12 level is 633, stable.    Repeat labs 6 months and MD assessment.  Earlie Server, MD, PhD Hematology Oncology [facility] at [facility] Pager- [phone] 10/07/2019

## 2019-10-11 LAB — COMP PANEL: LEUKEMIA/LYMPHOMA

## 2019-12-11 IMAGING — CT CT CTA ABD/PEL W/CM AND/OR W/O CM
3 of 10 series · 9 of 46 positions shown, 15 images · IV contrast (iopamidol)
Comparison: PET-CT 04/11/2017 and CTA 07/09/2011

CLINICAL DATA: Evaluate endovascular repair of the abdominal aortic
aneurysm.

EXAM:
CT ANGIOGRAPHY ABDOMEN AND PELVIS WITH CONTRAST AND WITHOUT CONTRAST
TECHNIQUE: Multidetector CT imaging of the abdomen and pelvis was performed
using the standard protocol during bolus administration of
intravenous contrast. Multiplanar reconstructed images and MIPs were
obtained and reviewed to evaluate the vascular anatomy.
CONTRAST:  100mL TQFO7U-WRE IOPAMIDOL (TQFO7U-WRE) INJECTION 76%

[Series 5: axial pre pre contrast · axial · non-contrast · 0.75mm/px · z∈[-1606,-1246]mm · 5 of 110 slices shown, 10 images]
[im 19/110  soft-tissue]
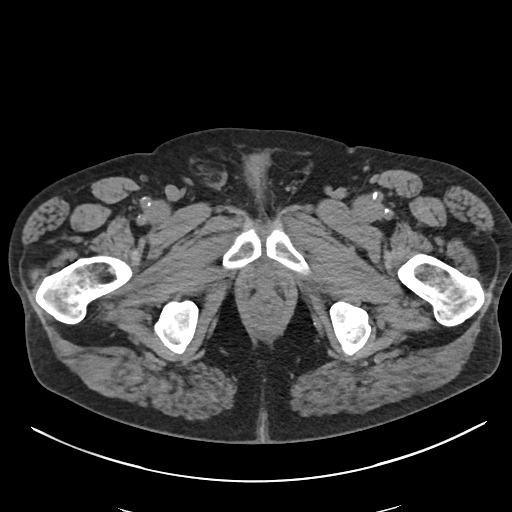
[im 19/110  bone]
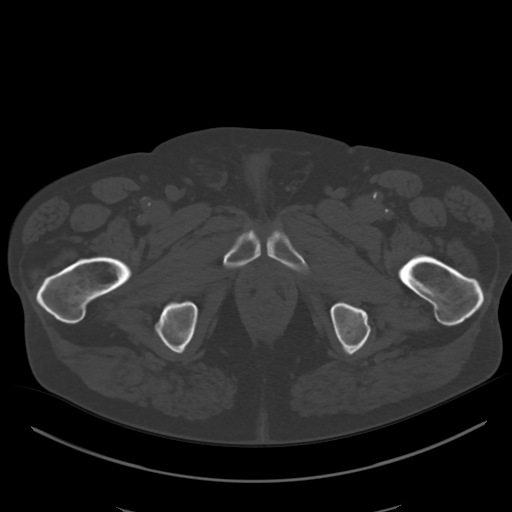
[im 37/110  soft-tissue]
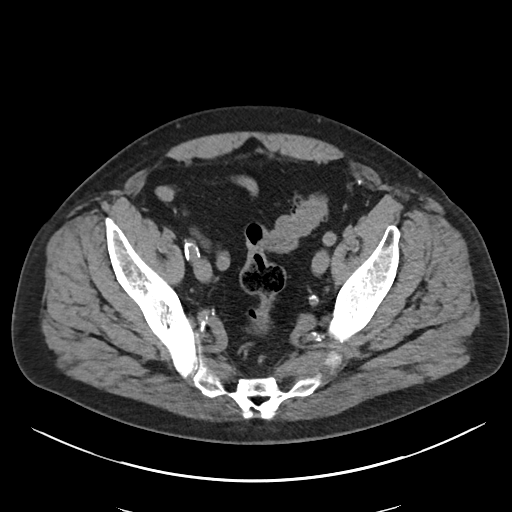
[im 37/110  lung]
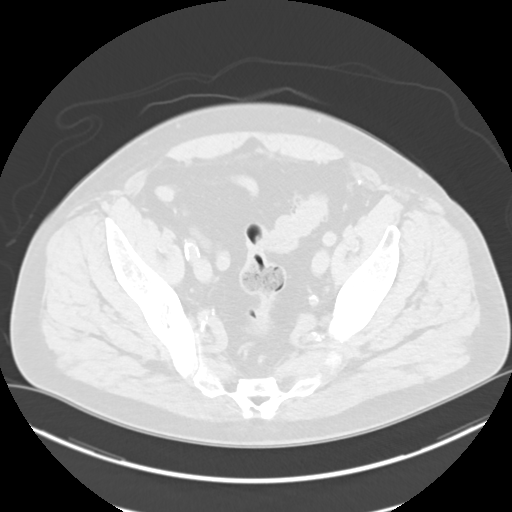
[im 55/110  soft-tissue]
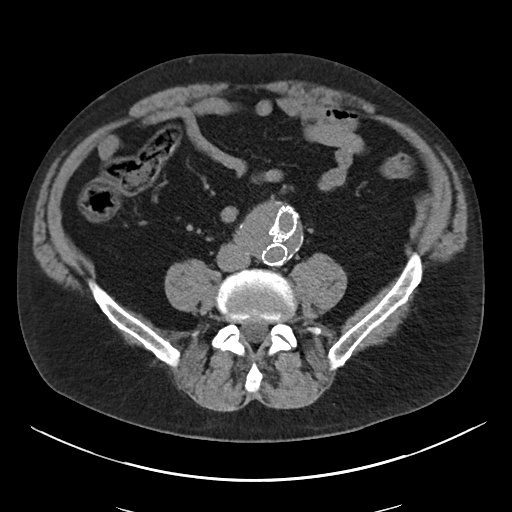
[im 55/110  lung]
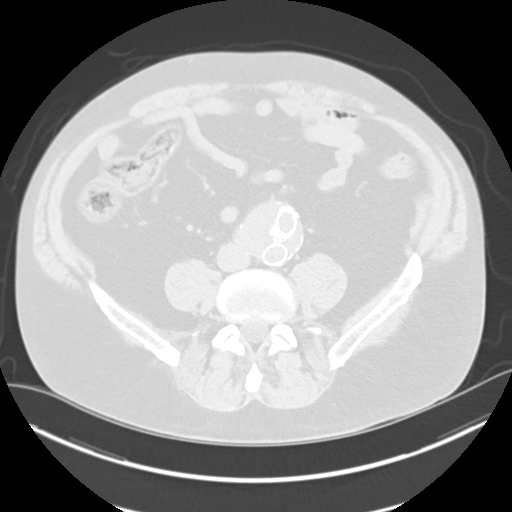
[im 73/110  soft-tissue]
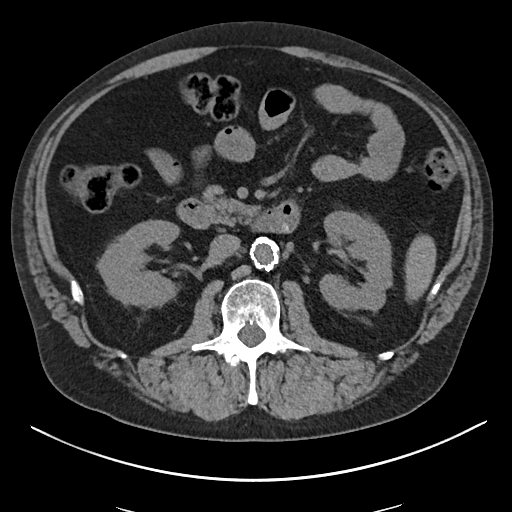
[im 73/110  lung]
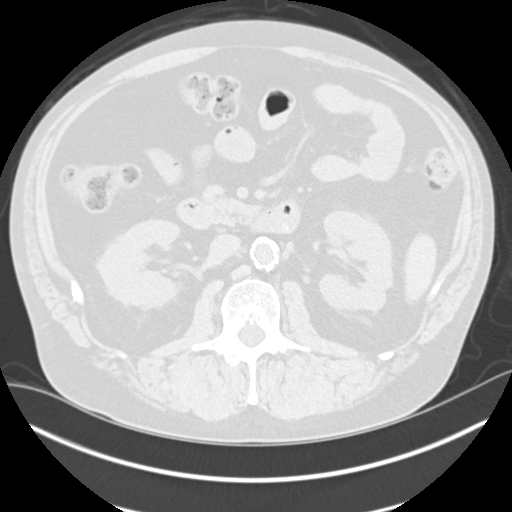
[im 91/110  soft-tissue]
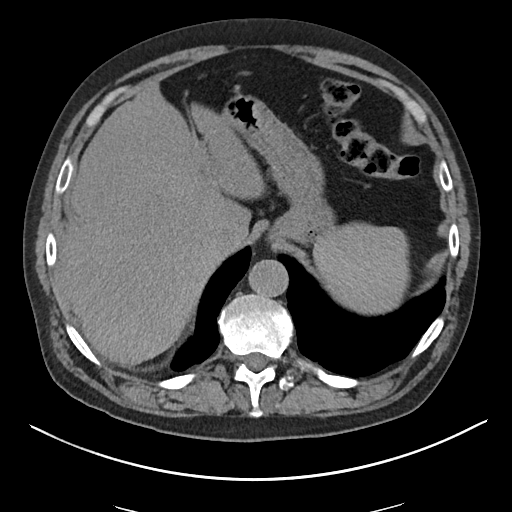
[im 91/110  lung]
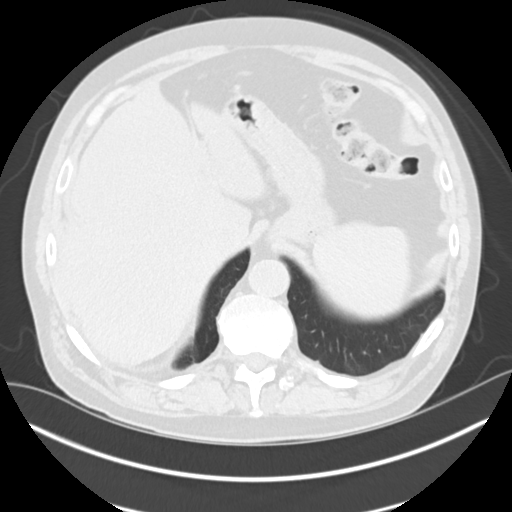

[Series 8: axial arterial arterial · axial · arterial · 0.73mm/px · z∈[-1616,-1556]mm · 2 of 239 slices shown]
[im 15/239  soft-tissue]
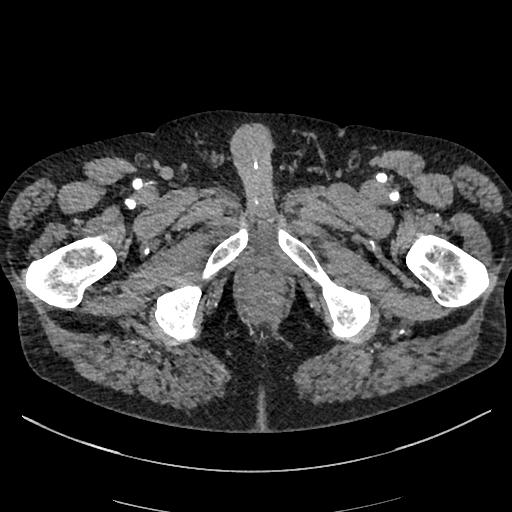
[im 45/239  soft-tissue]
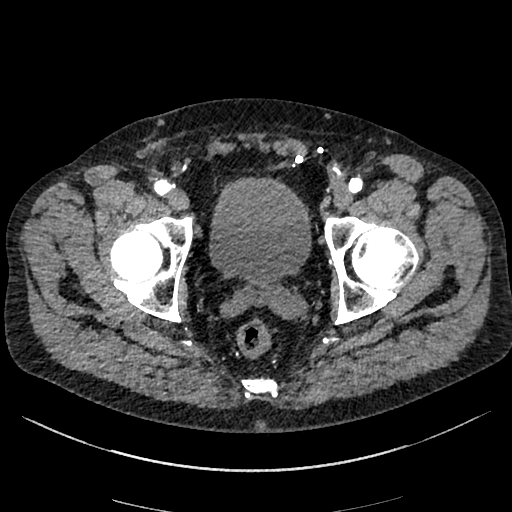

[Series 10: coronal arterial arterial · coronal · arterial · 0.73mm/px · 2 of 187 slices shown, 3 images]
[im 63/187  soft-tissue]
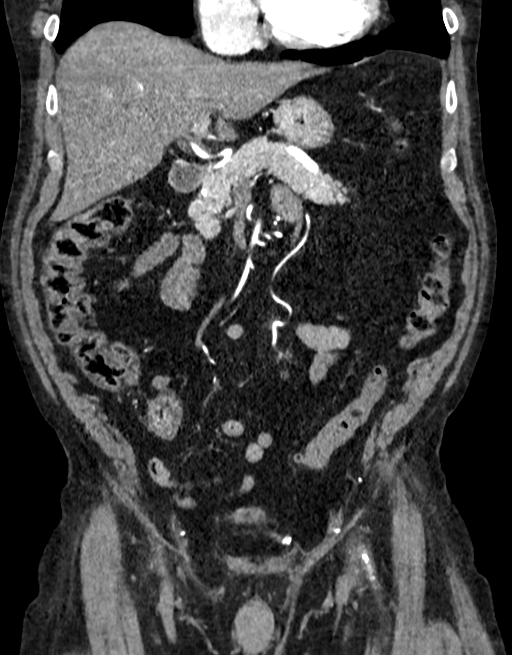
[im 63/187  bone]
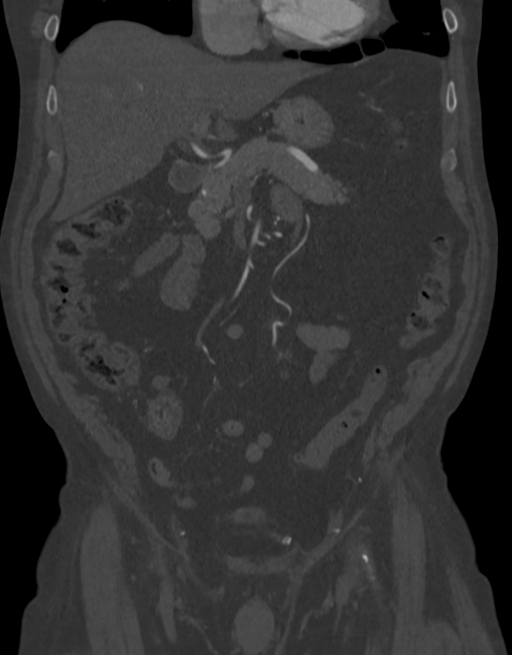
[im 125/187  soft-tissue]
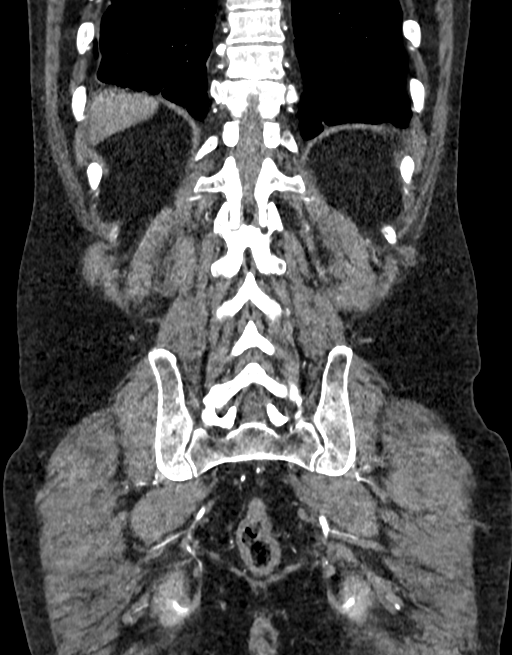

[9 of 46 positions shown; findings below may reference images not displayed]

FINDINGS: VASCULAR

Aorta: Endovascular repair of an abdominal aortic aneurysm. The
proximal aspect of the stent graft is in the infrarenal abdominal
aorta and located just below the right renal artery. The left limb
of the graft extends to the distal left common iliac artery. The
right limb terminates in the distal aorta near the bifurcation. The
stent does not extend into the right common iliac artery and is not
sealing or excluding the aneurysm. This is compatible with a type 1
endoleak, 1B. The aneurysm sac measures up to 5.1 cm on sequence 8,
image 120. Aneurysm sac measured 5.0 cm on 04/21/2017.

Celiac: Patent without evidence of aneurysm, dissection, vasculitis
or significant stenosis.

SMA: Patent without evidence of aneurysm, dissection, vasculitis or
significant stenosis.

Renals: Main bilateral renal arteries are patent. Mild
atherosclerotic disease in the renal arteries without significant
stenosis. Mild ectasia at the origin of the left renal artery.

IMA: Retrograde filling of the inferior mesenteric artery.

Inflow: Aneurysm of the right common iliac artery measuring up to
2.6 cm with a small amount of mural thrombus. Right common iliac
artery measured roughly 2.3 cm on 07/09/2011. Mild narrowing at the
origin of the right internal iliac artery. Evidence for a thrombosed
aneurysm sac at the right iliac artery bifurcation. This thrombosed
aneurysm sac measures roughly 2.2 cm, previously measured roughly
1.5 cm in 5031. Ectasia of the right common iliac artery and suspect
postoperative changes in this area. The distal left common iliac
artery beyond the stent measures 1.9 cm and minimally changed.
Ectasia and small aneurysm formations involving the left internal
iliac artery are again noted. Left external iliac artery is patent
without significant atherosclerotic disease.

Proximal Outflow: Proximal femoral arteries are patent bilaterally.

Veins: IVC and renal veins are patent. Visualized portal venous
system is patent.

Review of the MIP images confirms the above findings.

NON-VASCULAR

Lower chest: Few linear and bandlike densities at the lung bases are
suggestive for atelectasis or mild scarring. No large effusions.
Prior median sternotomy.

Hepatobiliary: Cholecystectomy.  No acute abnormality to the liver.

Pancreas: Unremarkable. No pancreatic ductal dilatation or
surrounding inflammatory changes.

Spleen: Decreased fullness of the spleen compared to the prior
PET-CT. No focal abnormality to the spleen. Spleen measures 9.5 x
7.6 cm on sequence 8, and image 48 and previously measured 13.4 x
9.8 cm on the recent PET-CT.

Adrenals/Urinary Tract: Chronic thickening involving the bilateral
adrenal glands that may represent hyperplasia. Chronic scarring
along the left kidney lower pole. Evidence for central cyst in the
left kidney. No hydronephrosis. Urinary bladder is unremarkable.
Small cyst in the mid/upper pole of the right kidney.

Stomach/Bowel: Diverticulosis in the sigmoid colon without acute
bowel inflammation. Negative for bowel obstruction. Normal appendix.

Lymphatic: No lymph node enlargement in the abdomen or pelvis.

Reproductive: Prostate is unremarkable.

Other: Right inguinal hernia containing fat. Evidence for previous
left inguinal hernia repair. No free fluid. Negative for free air

Musculoskeletal: No acute abnormality.
IMPRESSION: VASCULAR

Type 1b endoleak. Patient has an infrarenal abdominal aortic
aneurysm stent graft but the right side of the aortic bifurcation
and the right common iliac artery aneurysm are not excluded by the
stents. The right limb stent graft does not extend into the right
common iliac artery aneurysm. The abdominal aortic aneurysm measures
up to 5.1 cm. Aortic aneurysm sac size has not significantly changed
since the PET-CT on 04/21/2017.

Aneurysm of the right common iliac artery measuring up to 2.6 cm and
slightly enlarged since 5031.

Evidence for a thrombosed or partially thrombosed aneurysm sac at
the right iliac artery bifurcation measuring up to 2.2 cm and
enlarged since 5031.

NON-VASCULAR

Spleen has decreased in size since the PET-CT on 04/21/2017.

Left renal scarring.  Renal cysts.

## 2019-12-13 ENCOUNTER — Encounter: Payer: Self-pay | Admitting: Pharmacist

## 2019-12-13 NOTE — Progress Notes (Signed)
Oral Chemotherapy Pharmacist Encounter  Dispensed samples to patient:  Medication: Xarelto 20mg  Instructions: Take 1 tablet (20 mg total) by mouth daily with supper Quantity dispensed: 28 Days supply: 28 Manufacturer: [employer] Lot: [id number] Exp: 09/06/2021  Darl Pikes, PharmD, BCPS, [facility] Hematology/Oncology Clinical Pharmacist [facility] [phone]  12/13/2019 8:58 AM

## 2020-04-03 ENCOUNTER — Inpatient Hospital Stay (HOSPITAL_BASED_OUTPATIENT_CLINIC_OR_DEPARTMENT_OTHER): Payer: Medicare PPO | Admitting: Oncology

## 2020-04-03 ENCOUNTER — Inpatient Hospital Stay: Payer: Medicare PPO | Attending: Oncology

## 2020-04-03 ENCOUNTER — Encounter: Payer: Self-pay | Admitting: Oncology

## 2020-04-03 VITALS — BP 123/80 | HR 75 | Temp 97.6°F | Resp 18 | Wt 212.1 lb

## 2020-04-03 DIAGNOSIS — Z7901 Long term (current) use of anticoagulants: Secondary | ICD-10-CM | POA: Diagnosis not present

## 2020-04-03 DIAGNOSIS — I10 Essential (primary) hypertension: Secondary | ICD-10-CM | POA: Insufficient documentation

## 2020-04-03 DIAGNOSIS — I2510 Atherosclerotic heart disease of native coronary artery without angina pectoris: Secondary | ICD-10-CM | POA: Diagnosis not present

## 2020-04-03 DIAGNOSIS — Z86718 Personal history of other venous thrombosis and embolism: Secondary | ICD-10-CM | POA: Diagnosis not present

## 2020-04-03 DIAGNOSIS — Z79899 Other long term (current) drug therapy: Secondary | ICD-10-CM | POA: Diagnosis not present

## 2020-04-03 DIAGNOSIS — E538 Deficiency of other specified B group vitamins: Secondary | ICD-10-CM | POA: Insufficient documentation

## 2020-04-03 DIAGNOSIS — E785 Hyperlipidemia, unspecified: Secondary | ICD-10-CM | POA: Diagnosis not present

## 2020-04-03 DIAGNOSIS — I252 Old myocardial infarction: Secondary | ICD-10-CM | POA: Diagnosis not present

## 2020-04-03 DIAGNOSIS — I739 Peripheral vascular disease, unspecified: Secondary | ICD-10-CM | POA: Insufficient documentation

## 2020-04-03 DIAGNOSIS — I110 Hypertensive heart disease with heart failure: Secondary | ICD-10-CM | POA: Insufficient documentation

## 2020-04-03 DIAGNOSIS — R634 Abnormal weight loss: Secondary | ICD-10-CM | POA: Diagnosis not present

## 2020-04-03 DIAGNOSIS — C9110 Chronic lymphocytic leukemia of B-cell type not having achieved remission: Secondary | ICD-10-CM

## 2020-04-03 DIAGNOSIS — K219 Gastro-esophageal reflux disease without esophagitis: Secondary | ICD-10-CM | POA: Insufficient documentation

## 2020-04-03 DIAGNOSIS — G629 Polyneuropathy, unspecified: Secondary | ICD-10-CM | POA: Diagnosis not present

## 2020-04-03 DIAGNOSIS — I509 Heart failure, unspecified: Secondary | ICD-10-CM | POA: Diagnosis not present

## 2020-04-03 DIAGNOSIS — F17210 Nicotine dependence, cigarettes, uncomplicated: Secondary | ICD-10-CM | POA: Insufficient documentation

## 2020-04-03 DIAGNOSIS — N400 Enlarged prostate without lower urinary tract symptoms: Secondary | ICD-10-CM | POA: Insufficient documentation

## 2020-04-03 DIAGNOSIS — D696 Thrombocytopenia, unspecified: Secondary | ICD-10-CM | POA: Insufficient documentation

## 2020-04-03 DIAGNOSIS — R161 Splenomegaly, not elsewhere classified: Secondary | ICD-10-CM | POA: Insufficient documentation

## 2020-04-03 DIAGNOSIS — M069 Rheumatoid arthritis, unspecified: Secondary | ICD-10-CM | POA: Insufficient documentation

## 2020-04-03 DIAGNOSIS — Z7982 Long term (current) use of aspirin: Secondary | ICD-10-CM | POA: Diagnosis not present

## 2020-04-03 DIAGNOSIS — G4733 Obstructive sleep apnea (adult) (pediatric): Secondary | ICD-10-CM | POA: Diagnosis not present

## 2020-04-03 DIAGNOSIS — I4891 Unspecified atrial fibrillation: Secondary | ICD-10-CM | POA: Diagnosis not present

## 2020-04-03 DIAGNOSIS — I714 Abdominal aortic aneurysm, without rupture: Secondary | ICD-10-CM | POA: Diagnosis not present

## 2020-04-03 LAB — COMPREHENSIVE METABOLIC PANEL
ALT: 45 U/L — ABNORMAL HIGH (ref 0–44)
AST: 46 U/L — ABNORMAL HIGH (ref 15–41)
Albumin: 4.2 g/dL (ref 3.5–5.0)
Alkaline Phosphatase: 77 U/L (ref 38–126)
Anion gap: 11 (ref 5–15)
BUN: 18 mg/dL (ref 8–23)
CO2: 25 mmol/L (ref 22–32)
Calcium: 8.9 mg/dL (ref 8.9–10.3)
Chloride: 104 mmol/L (ref 98–111)
Creatinine, Ser: 0.76 mg/dL (ref 0.61–1.24)
GFR, Estimated: >60 mL/min (ref >60)
Glucose, Bld: 132 mg/dL — ABNORMAL HIGH (ref 70–99)
Potassium: 3.7 mmol/L (ref 3.5–5.1)
Sodium: 140 mmol/L (ref 135–145)
Total Bilirubin: 1.6 mg/dL — ABNORMAL HIGH (ref 0.3–1.2)
Total Protein: 7.1 g/dL (ref 6.5–8.1)

## 2020-04-03 LAB — CBC WITH DIFFERENTIAL/PLATELET
Abs Immature Granulocytes: 0.01 10*3/uL (ref 0.00–0.07)
Basophils Absolute: 0 10*3/uL (ref 0.0–0.1)
Basophils Relative: 0 %
Eosinophils Absolute: 0.1 10*3/uL (ref 0.0–0.5)
Eosinophils Relative: 2 %
HCT: 44.1 % (ref 39.0–52.0)
Hemoglobin: 14.4 g/dL (ref 13.0–17.0)
Immature Granulocytes: 0 %
Lymphocytes Relative: 23 %
Lymphs Abs: 1 10*3/uL (ref 0.7–4.0)
MCH: 29.9 pg (ref 26.0–34.0)
MCHC: 32.7 g/dL (ref 30.0–36.0)
MCV: 91.7 fL (ref 80.0–100.0)
Monocytes Absolute: 0.3 10*3/uL (ref 0.1–1.0)
Monocytes Relative: 8 %
Neutro Abs: 2.8 10*3/uL (ref 1.7–7.7)
Neutrophils Relative %: 67 %
Platelets: 132 10*3/uL — ABNORMAL LOW (ref 150–400)
RBC: 4.81 MIL/uL (ref 4.22–5.81)
RDW: 15.2 % (ref 11.5–15.5)
WBC: 4.2 10*3/uL (ref 4.0–10.5)
nRBC: 0 % (ref 0.0–0.2)

## 2020-04-03 LAB — LACTATE DEHYDROGENASE: LDH: 128 U/L (ref 98–192)

## 2020-04-03 NOTE — Progress Notes (Signed)
Patient reports the neuropathy is stable.

## 2020-04-03 NOTE — Progress Notes (Signed)
Hematology/Oncology Follow up note [facility] [phone] [phone]   Patient Care Team: [name], MD as PCP - General (Internal Medicine) Earlie Server, MD as Medical Oncologist (Medical Oncology)  REFERRING PROVIDER: [name], MD  REASON FOR VISIT Follow up for management of CLL HISTORY OF PRESENTING ILLNESS:  Martin Adkins is a  75 y.o.  male with PMH listed below who was referred to me for evaluation of lymphocytosis.  Recent lab work on 03/05/2017 showed wbc 11.4, hb 14.6, platelet count 117,000, lymphocytosis 63.5%, neutrophil 22%, Report chronic fatigue, no weight loss, fever or chills.  He reports feeling chest "rattleness". Has for CT chest which showed acute finding, chronic finding includes  postsurgical changes consistent with coronary bypass grafting. One of the bypass grafts arising from the aorta shows apparent thrombosis and an area of distal aneurysmal dilatation which is also Thrombosed. Right adrenal adenoma stable in appearance.Mild scarring in the lung bases.Fatty liver.   Takes Aspirin 21m, coumadine, plavix. He is on anticoagulation for previous history of clots.  Denies any bleeding events. Use to drink hard liquir daily, quitted for 4-5 years. Current drinks wine on weekend.    Image Studies # 04/04/2017  [place]dom[place] showed 1.  Splenomegaly.  No focal splenic lesions evident.2. Increased liver echogenicity, a finding most likely indicative of hepatic steatosis. While no focal liver lesions are evident on this study, it must be cautioned that the sensitivity of ultrasound for detection of focal liver lesions is diminished in this circumstance.3. Pancreas obscured by gas. Portions of inferior vena cava obscured by gas.4.  Gallbladder absent.5. Status post abdominal aortic aneurysm repair. No periaortic fluid.  # 04/21/2017 PET scan 1. Moderate splenomegaly with uniform splenic hypermetabolism,compatible  with the provided history of lymphoproliferative disease.No additional hypermetabolic sites of lymphoproliferative disease. Specifically, no hypermetabolic lymphadenopathy Hepatic steatosis, aortic atherosclerosis. Aneurysm.    # CLL, stage IV disease given spelencemagly, thrombocytopenia, symptomatic with fatigue and weight loss.  CLL IPL score 4, high risk.   # status post abdominal aortic aneurysm repair on 09/25/2017 s/p cardiac cath which revealed patent LIMA to the LAD, no significant disease in the RCA with a 90% stenosis in a small RV marginal branch.  Occluded left circumflex, occluded LAD.  Saphenous vein grafts are all occluded.  RCA does not require grafting.  LAD is well grafted by the left internal mammary.  Medical management  INTERVAL HISTORY Martin JRAMESH MOANis a 75y.o. male who has above presents for assessing toxicities from Venetoclax for treatment for CLL.  He has been off venetoclax since 11/11/2017. Patient reports feeling well.  No constitutional symptoms States his pain has resolved. 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  Review of Systems  Constitutional: Negative for appetite change, chills, fatigue, fever and unexpected weight change.  HENT:   Negative for hearing loss and voice change.   Eyes: Negative for eye problems and icterus.  Respiratory: Negative for chest tightness, cough, shortness of breath and wheezing.   Cardiovascular: Negative for chest pain and leg swelling.  Gastrointestinal: Negative for abdominal distention and abdominal pain.  Endocrine: Negative for hot flashes.  Genitourinary: Negative for difficulty urinating, dysuria and frequency.   Musculoskeletal: Negative for arthralgias and back pain.  Skin: Negative for itching and rash.  Neurological: Positive for numbness. Negative for dizziness, headaches and light-headedness.  Hematological: Negative for adenopathy. Does not bruise/bleed easily.   Psychiatric/Behavioral: Negative for confusion.  MEDICAL HISTORY:  Past Medical History:  Diagnosis Date  . AAA (abdominal aortic aneurysm) without rupture ([place]) 04/05/2014  . AAA (abdominal aortic aneurysm) without rupture ([place]) 04/05/2014  . Abdominal aortic aneurysm without rupture ([place])   . Acute non-ST elevation myocardial infarction (NSTEMI) ([place]) 08/15/2014  . 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/28/2014  . APS (antiphospholipid syndrome) ([place])   . Arteriosclerosis of coronary artery 04/05/2014   Overview:  Sp cabg with lima to lad svg to d1 om1 rca 2004 with occluded svg to rca and d1 and pci stetn of om1 graft 2015   . Arthritis   . B12 deficiency 03/21/2017  . Barrett's esophagus   . Benign essential HTN 05/02/2014  . BPH (benign prostatic hyperplasia)   . CAD (coronary artery disease)   . CHF (congestive heart failure) ([place])   . Chronic systolic heart failure ([place]) 04/19/2014   Overview:  With segmental LV systolic dysfunction ejection fraction of 35%   . CLL (chronic lymphocytic leukemia) ([place])   . CLL (chronic lymphocytic leukemia) ([place]) 05/24/2017  . DVT (deep venous thrombosis) ([place])   . Dysrhythmia    ATRIAL FIB.  Marland Kitchen GERD (gastroesophageal reflux disease)   . History of hiatal hernia   . History of shingles 2013  . Hyperlipemia   . Hyperplastic polyps of stomach   . Hypertension   . Myocardial infarction ([place]) 07/30/2014  . NSTEMI (non-ST elevated myocardial infarction) ([place])   . OSA (obstructive sleep apnea)    does not use CPAP  . Osteoarthritis   . Pneumonia   . Pre-diabetes   . PVD (peripheral vascular disease) ([place])   . RA (rheumatoid arthritis) ([place])   . SOB (shortness of breath)     SURGICAL HISTORY: Past Surgical History:  Procedure Laterality Date  . ABDOMINAL AORTA STENT    . CHOLECYSTECTOMY    . COLONOSCOPY  11/24/2009  . CORONARY ANGIOPLASTY WITH STENT PLACEMENT  2015  . CORONARY ARTERY BYPASS GRAFT    . DUPUYTREN CONTRACTURE RELEASE Left  12/09/2018   Procedure: DUPUYTREN CONTRACTURE RELEASE LEFT LONG FINGER;  Surgeon: Corky Mull, MD;  Location: [facility] ORS;  Service: Orthopedics;  Laterality: Left;  . ENDOVASCULAR REPAIR/STENT GRAFT N/A 09/24/2017   Procedure: ENDOVASCULAR REPAIR/STENT GRAFT;  Surgeon: Algernon Huxley, MD;  Location: [place] CV LAB;  Service: Cardiovascular;  Laterality: N/A;  . ESOPHAGOGASTRODUODENOSCOPY  05/26/02  03/23/12  . ESOPHAGOGASTRODUODENOSCOPY (EGD) WITH PROPOFOL N/A 10/28/2016   Procedure: ESOPHAGOGASTRODUODENOSCOPY (EGD) WITH PROPOFOL;  Surgeon: Manya Silvas, MD;  Location: [facility] ENDOSCOPY;  Service: Endoscopy;  Laterality: N/A;  . HERNIA REPAIR Left    inguinal  . LEFT HEART CATH AND CORONARY ANGIOGRAPHY N/A 01/19/2018   Procedure: LEFT HEART CATH AND CORONARY ANGIOGRAPHY;  Surgeon: Teodoro Spray, MD;  Location: [place] CV LAB;  Service: Cardiovascular;  Laterality: N/A;  . PERIPHERAL VASCULAR CATHETERIZATION N/A 09/06/2014   Procedure: IVC Filter Removal;  Surgeon: Katha Cabal, MD;  Location: [place] CV LAB;  Service: Cardiovascular;  Laterality: N/A;  . TRANSURETHRAL RESECTION OF PROSTATE N/A 02/20/2015   Procedure: TRANSURETHRAL RESECTION OF THE PROSTATE (TURP);  Surgeon: Hollice Espy, MD;  Location: [facility] ORS;  Service: Urology;  Laterality: N/A;    SOCIAL HISTORY: Social History   Socioeconomic History  . Marital status: Legally Separated    Spouse name: Not on file  . Number of children: Not on file  . Years of education: Not on file  . Highest education level: Not on file  Occupational  History  . Not on file  Tobacco Use  . Smoking status: Current Some Day Smoker    Packs/day: 0.50    Years: 20.00    Pack years: 10.00    Types: Cigarettes  . Smokeless tobacco: Never Used  . Tobacco comment: sometimes 2-3 a day  Vaping Use  . Vaping Use: Never used  Substance and Sexual Activity  . Alcohol use: Yes    Alcohol/week: 1.0 - 2.0 standard drink    Types: 1  - 2 Shots of liquor per week  . Drug use: No  . Sexual activity: Never  Other Topics Concern  . Not on file  Social History Narrative  . Not on file   Social Determinants of Health   Financial Resource Strain: Not on file  Food Insecurity: Not on file  Transportation Needs: Not on file  Physical Activity: Not on file  Stress: Not on file  Social Connections: Not on file  Intimate Partner Violence: Not on file    FAMILY HISTORY: Family History  Problem Relation Age of Onset  . Cancer Mother 86       breast ca  . Diabetes Mother   . Coronary artery disease Father   . Heart attack Father   . Prostate cancer Brother   . Bladder Cancer Neg Hx   . Kidney cancer Neg Hx     ALLERGIES:  is allergic to ace inhibitors, pravastatin, rosuvastatin, simvastatin, amoxicillin, niacin, and penicillins.  MEDICATIONS:  Current Outpatient Medications  Medication Sig Dispense Refill  . albuterol (PROVENTIL HFA;VENTOLIN HFA) 108 (90 Base) MCG/ACT inhaler Inhale 2 puffs into the lungs every 6 (six) hours as needed for wheezing or shortness of breath. 1 Inhaler 0  . amLODipine (NORVASC) 10 MG tablet Take 10 mg by mouth daily.     Marland Kitchen atorvastatin (LIPITOR) 80 MG tablet Take 80 mg by mouth daily.   11  . dorzolamide (TRUSOPT) 2 % ophthalmic solution Place 1 drop into both eyes 2 (two) times daily.     . fluticasone (FLONASE) 50 MCG/ACT nasal spray Place 2 sprays into both nostrils daily.     Marland Kitchen gabapentin (NEURONTIN) 300 MG capsule Take 1 capsule (300 mg total) by mouth 3 (three) times daily. (Patient taking differently: Take 300 mg by mouth 2 (two) times daily. 2 tabs BID) 90 capsule 0  . HYDROcodone-acetaminophen (NORCO) 5-325 MG tablet Take 1-2 tablets by mouth every 6 (six) hours as needed for moderate pain. MAXIMUM TOTAL ACETAMINOPHEN DOSE IS 4000 MG PER DAY 30 tablet 0  . Ipratropium-Albuterol (COMBIVENT) 20-100 MCG/ACT AERS respimat Inhale 1 puff into the lungs every 6 (six) hours. 1 Inhaler 11   . isosorbide mononitrate (IMDUR) 60 MG 24 hr tablet Take 1 tablet (60 mg total) by mouth daily. 30 tablet 0  . latanoprost (XALATAN) 0.005 % ophthalmic solution Place 1 drop into both eyes at bedtime.     . metoprolol succinate (TOPROL-XL) 100 MG 24 hr tablet Take 100 mg by mouth daily.     . Multiple Vitamins-Minerals (MULTIVITAMIN MEN 50+ PO) Take 1 tablet by mouth daily.    . pantoprazole (PROTONIX) 40 MG tablet Take 40 mg by mouth daily.     . potassium chloride SA (K-DUR,KLOR-CON) 20 MEQ tablet Take 20 mEq by mouth daily.     . rivaroxaban (XARELTO) 20 MG TABS tablet Take 1 tablet (20 mg total) by mouth daily with supper. 30 tablet 3  . VITAMIN D, CHOLECALCIFEROL, PO Take 1,000 Units/day by  mouth daily.    . baclofen (LIORESAL) 10 MG tablet  (Patient not taking: Reported on 04/03/2020)    . vitamin B-12 (CYANOCOBALAMIN) 1000 MCG tablet  (Patient not taking: Reported on 04/03/2020)     No current facility-administered medications for this visit.     PHYSICAL EXAMINATION: ECOG 1 Vitals:   04/03/20 1012  BP: 123/80  Pulse: 75  Resp: 18  Temp: 97.6 F (36.4 C)  Physical Exam Vitals and nursing note reviewed.  Constitutional:      General: He is not in acute distress.    Appearance: He is not diaphoretic.  HENT:     Head: Normocephalic and atraumatic.     Nose: Nose normal.     Mouth/Throat:     Pharynx: No oropharyngeal exudate.  Eyes:     General: No scleral icterus.       Left eye: No discharge.     Pupils: Pupils are equal, round, and reactive to light.  Neck:     Vascular: No JVD.  Cardiovascular:     Rate and Rhythm: Normal rate and regular rhythm.     Heart sounds: Normal heart sounds. No murmur heard.   Pulmonary:     Effort: Pulmonary effort is normal. No respiratory distress.     Breath sounds: No wheezing or rales.  Chest:     Chest wall: No tenderness.  Abdominal:     General: There is no distension.     Palpations: Abdomen is soft.     Tenderness:  There is no abdominal tenderness.  Musculoskeletal:        General: No deformity. Normal range of motion.     Cervical back: Normal range of motion and neck supple.  Lymphadenopathy:     Cervical: No cervical adenopathy.  Skin:    General: Skin is warm and dry.     Findings: No erythema.  Neurological:     Mental Status: He is alert and oriented to person, place, and time.     Cranial Nerves: No cranial nerve deficit.     Motor: No abnormal muscle tone.     Coordination: Coordination normal.  Psychiatric:        Mood and Affect: Affect normal.      LABORATORY DATA:  I have reviewed the data as listed Lab Results  Component Value Date   WBC 4.2 04/03/2020   HGB 14.4 04/03/2020   HCT 44.1 04/03/2020   MCV 91.7 04/03/2020   PLT 132 (L) 04/03/2020   Recent Labs    04/06/19 1001 10/07/19 0924 04/03/20 0951  NA 136 140 140  K 3.7 3.8 3.7  CL 102 103 104  CO2 '25 27 25  ' GLUCOSE 130* 136* 132*  BUN '13 13 18  ' CREATININE 0.74 0.94 0.76  CALCIUM 8.8* 9.3 8.9  GFRNONAA >60 >60 >60  GFRAA >60 >60  --   PROT 7.5 7.6 7.1  ALBUMIN 4.2 4.5 4.2  AST 34 40 46*  ALT 28 37 45*  ALKPHOS 115 91 77  BILITOT 1.4* 1.5* 1.6*   Hepatitis panel negative.  LDH 228, mildly elevated Beta 2 microglobulin normal.   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 The morphologic and immunophenotypic features are most consistent with chronic lymphocytic leukemia/small lymphocytic lymphoma. Bone marrow Cytogenetics : normal.  Peripheral blood FISH panel negative for CCND1- IGH mutation, ATM, 12, 13q, Tp53 mutation.   RADIOGRAPHIC STUDIES: I have personally reviewed the radiological images as  listed and agreed with the findings in the report. CT angio abd/pelvis showed Type 1b endoleak, AAA aneurysm, Spleen has decreased in size since PET scan in April 2019.   ASSESSMENT & PLAN:  75 y.o. male presents for fol low-up CLL. Clinically doing well.   1. CLL (chronic lymphocytic leukemia) ([place])   2. Long term current use of anticoagulant therapy   3. Thrombocytopenia ([place])   4. B12 deficiency    #CLL, stable.  Patient has been in remission.  Off treatment since November 2019. 10/07/19/2020 Peripheral blood flow cytometry negative for immunophenotypic abnormality.  Labs reviewed and discussed with patient. Counts are stable.  Patient has no constitutional symptoms. Continue observation.  # Neuropathy, follows up with neurology. He takes gabapentin. #Atrial fibrillation, on chronic anticoagulation with Xarelto.  Follow-up with cardiology. #Thrombocytopenia, stable.   Repeat labs 6 months and MD assessment.  Earlie Server, MD, PhD Hematology Oncology [facility] Pager- [phone] 04/03/2020

## 2020-06-13 ENCOUNTER — Ambulatory Visit: Payer: Self-pay | Admitting: General Surgery

## 2020-06-13 MED ORDER — METRONIDAZOLE 500 MG/100ML IV SOLN
500.0000 mg | INTRAVENOUS | Status: AC
  Administered 2020-06-14: 500 mg via INTRAVENOUS
  Filled 2020-06-13: qty 100

## 2020-06-13 MED ORDER — CIPROFLOXACIN IN D5W 400 MG/200ML IV SOLN: 400.0000 mg | INTRAVENOUS | Status: AC

## 2020-06-13 NOTE — H&P (View-Only) (Signed)
PATIENT PROFILE: Martin Adkins is a 75 y.o. male who presents to the Clinic for consultation at the request of Dr. Edwina Barth for evaluation of perirectal abscess.  PCP:  Velna Ochs, MD  HISTORY OF PRESENT ILLNESS: Martin Adkins reports pain on the perirectal area since 4 days ago.  The patient reported the pain is localized to his perianal area.  There is no pain radiation.  Pain aggravated by pressure.  There has been elevating factors.  Patient denies any fever or chills.  Patient reports purulent drainage.  Patient denies any previous history of abscess.  Patient is currently on Xarelto due to A. fib and history DVT.   PROBLEM LIST: Problem List  Date Reviewed: 06/13/2020         Noted   Dupuytren's contracture of left hand 11/02/2018   CLL (chronic lymphocytic leukemia) (CMS-HCC) 07/08/2017   DVT of deep femoral vein, left (CMS-HCC) 01/23/2015   Atrial fibrillation [I48.91] 08/05/2014   APS (antiphospholipid syndrome) (CMS-HCC) 08/01/2014   H/O deep venous thrombosis 08/01/2014   Benign essential hypertension 5/95/6387   Chronic systolic CHF (congestive heart failure), NYHA class 2 (CMS-HCC) 04/19/2014   Overview    With segmental LV systolic dysfunction ejection fraction of 35%       Abdominal aneurysm without mention of rupture 04/05/2014   CAD (coronary artery disease) 04/05/2014   Overview    Sp cabg with lima to lad svg to d1 om1 rca 2004 with occluded svg to rca and d1 and pci stetn of om1 graft 2015       Hyperplastic polyps of stomach Unknown   Barrett's esophagus Unknown   Diabetes mellitus type 2, uncomplicated (CMS-HCC) Unknown   GERD (gastroesophageal reflux disease) 05/04/2013   Overview    H/o Barrett's esophagus       Mixed hyperlipidemia 05/03/2013   RA (rheumatoid arthritis) (CMS-HCC) (Chronic) 05/03/2013   Overview    a.  Methotrexate.    b.  Positive rheumatoid factor, negative anti-CCP antibodies.   c.  Enbrel.         OSA (obstructive sleep  apnea) (Chronic) 05/03/2013   Tobacco abuse (Chronic) 05/03/2013   PVD (peripheral vascular disease) (CMS-HCC) (Chronic) 05/03/2013   Osteoarthritis 05/03/2013   Overview    a.  Lumbar spine.   b.  Dupuytren's contractures.            GENERAL REVIEW OF SYSTEMS:   General ROS: negative for - chills, fatigue, fever, weight gain or weight loss Allergy and Immunology ROS: negative for - hives  Hematological and Lymphatic ROS: Positive for - bleeding problems or bruising, negative for palpable nodes Endocrine ROS: negative for - heat or cold intolerance, hair changes Respiratory ROS: negative for - cough, shortness of breath or wheezing Cardiovascular ROS: no chest pain or palpitations GI ROS: negative for nausea, vomiting, abdominal pain, diarrhea, constipation Musculoskeletal ROS: Positive for - joint swelling or muscle pain Neurological ROS: negative for - confusion, syncope Dermatological ROS: negative for pruritus and rash Psychiatric: negative for anxiety, depression, difficulty sleeping and memory loss  MEDICATIONS: Current Outpatient Medications  Medication Sig Dispense Refill  . amLODIPine (NORVASC) 10 MG tablet TAKE 1 TABLET EVERY DAY 90 tablet 1  . atorvastatin (LIPITOR) 80 MG tablet TAKE 1 TABLET EVERY DAY 90 tablet 3  . ciprofloxacin HCl (CIPRO) 500 MG tablet Take 1 tablet (500 mg total) by mouth 2 (two) times daily for 10 days 20 tablet 0  . dorzolamide (TRUSOPT) 2 % ophthalmic solution INSTILL ONE  DROP IN [facility] EYE TWICE DAILY    . ergocalciferol, vitamin D2, 1,250 mcg (50,000 unit) capsule Take 1 capsule (50,000 Units total) by mouth once a week 8 capsule 0  . fluticasone propionate (FLONASE) 50 mcg/actuation nasal spray Place 2 sprays into both nostrils once daily 48 g 3  . gabapentin (NEURONTIN) 300 MG capsule TAKE 2 CAPSULES TWICE DAILY (AM AND PM) AND TAKE 1 CAPSULE MIDDAY 180 capsule 1  . isosorbide mononitrate (IMDUR) 60 MG ER tablet TAKE 1 TABLET EVERY DAY 90 tablet  1  . latanoprost (XALATAN) 0.005 % ophthalmic solution INSTILL ONE DROP IN [facility] EYE AT BEDTIME    . losartan (COZAAR) 25 MG tablet Take 1 tablet (25 mg total) by mouth once daily 90 tablet 3  . metoprolol succinate (TOPROL-XL) 100 MG XL tablet TAKE 1 TABLET EVERY DAY 90 tablet 1  . metroNIDAZOLE (FLAGYL) 500 MG tablet Take 1 tablet (500 mg total) by mouth 2 (two) times daily for 10 days 20 tablet 0  . pantoprazole (PROTONIX) 40 MG DR tablet TAKE 1 TABLET EVERY DAY 90 tablet 1  . peg-electrolyte (NULYTELY) solution Take 4,000 mLs by mouth as directed 4000 mL 0  . potassium chloride (KLOR-CON) 20 MEQ ER tablet TAKE 1 TABLET EVERY DAY 90 tablet 1  . traMADoL (ULTRAM) 50 mg tablet 1/2 to 1 po bid prn 14 tablet 0  . VENTOLIN HFA 90 mcg/actuation inhaler Inhale 2 inhalations into the lungs every 6 (six) hours as needed for Wheezing. 1 Inhaler 5  . XARELTO 20 mg tablet TAKE 1 TABLET EVERY DAY 90 tablet 1   No current facility-administered medications for this visit.    ALLERGIES: Ace inhibitors, Crestor [rosuvastatin], Niacin, Penicillins, Pravastatin, Zocor [simvastatin], and Amoxicillin  PAST MEDICAL HISTORY: Past Medical History:  Diagnosis Date  . AAA (abdominal aortic aneurysm) (CMS-HCC)   . Atrial fibrillation (CMS-HCC)   . Barrett's esophagus   . Coronary artery disease   . Diabetes mellitus type 2, uncomplicated (CMS-HCC)   . DVT (deep venous thrombosis) (CMS-HCC)   . GERD (gastroesophageal reflux disease)   . Hiatal hernia   . Hypercholesterolemia   . Hyperplastic polyps of stomach   . Hypertension   . Shingles   . Thrombocytopenia (CMS-HCC)   . Tobacco abuse     PAST SURGICAL HISTORY: Past Surgical History:  Procedure Laterality Date  . ABDOMINAL AORTIC ANEURYSM REPAIR  July 2013  . ANGIOPLASTY / STENTING ILIAC    . CARDIAC CATHETERIZATION    . CHOLECYSTECTOMY    . COLONOSCOPY  11/24/2009, 06/28/2004, 08/28/1998   Hyperplastic Polyps: CBF 11/2019  . CORONARY  ANGIOPLASTY    . CORONARY ANGIOPLASTY WITH STENT PLACEMENT    . CORONARY ARTERY BYPASS GRAFT    . EGD  05/26/2002   Normal  . EGD  03/23/2012, 11/24/2009   Barrett's Esophagus: CBF 03/2015; Recall Ltr mailed 01/27/2015 (dw)  . EGD  10/28/2016   No Barrett's Seen: CBF 10/2021  . PROSTATE SURGERY    . Release of Dupuytren's contracture, left long finger Left 12/09/2018   Dr.Poggi   . UPPER GASTROINTESTINAL ENDOSCOPY       FAMILY HISTORY: Family History  Problem Relation Age of Onset  . Diabetes type II Mother   . Cancer Mother   . Myocardial Infarction (Heart attack) Father      SOCIAL HISTORY: Social History   Socioeconomic History  . Marital status: Legally Separated  Tobacco Use  . Smoking status: Current Some Day Smoker  Packs/day: 1.00    Years: 51.00    Pack years: 51.00    Types: Cigarettes  . Smokeless tobacco: Never Used  . Tobacco comment: currently 0.5 pack/day  Vaping Use  . Vaping Use: Never used  Substance and Sexual Activity  . Alcohol use: Yes    Alcohol/week: 3.0 standard drinks    Types: 2 Glasses of wine, 1 Shots of liquor, 2 Standard drinks or equivalent per week    Comment: socially, not daily couple glasses of wine   . Drug use: No  . Sexual activity: Yes    Partners: Female    Birth control/protection: None    PHYSICAL EXAM: Vitals:   06/13/20 1533  BP: 122/76  Pulse: 87   Body mass index is 27.44 kg/m. Weight: 94.3 kg (208 lb)   GENERAL: Alert, active, oriented x3  HEENT: Pupils equal reactive to light. Extraocular movements are intact. Sclera clear. Palpebral conjunctiva normal red color.Pharynx clear.  NECK: Supple with no palpable mass and no adenopathy.  LUNGS: Sound clear with no rales rhonchi or wheezes.  HEART: Regular rhythm S1 and S2 without murmur.  ABDOMEN: Soft and depressible, nontender with no palpable mass, no hepatomegaly.   RECTAL: There is a palpable tenderness on the left perianal area.  There is erythema.   There is a deep fluid collection.  EXTREMITIES: Well-developed well-nourished symmetrical with no dependent edema.  NEUROLOGICAL: Awake alert oriented, facial expression symmetrical, moving all extremities.  REVIEW OF DATA: I have reviewed the following data today: Office Visit on 05/03/2020  Component Date Value  . Influenza A PCR 05/03/2020 Negative   . Influenza B PCR 05/03/2020 Negative   . SARS-CoV2 PCR 05/03/2020 Negative   Appointment on 04/17/2020  Component Date Value  . Glucose 04/17/2020 105   . Sodium 04/17/2020 141   . Potassium 04/17/2020 4.0   . Chloride 04/17/2020 105   . Carbon Dioxide (CO2) 04/17/2020 28.2   . Urea Nitrogen (BUN) 04/17/2020 14   . Creatinine 04/17/2020 0.8   . Glomerular Filtration Ra* 04/17/2020 115   . Calcium 04/17/2020 9.2   . AST  04/17/2020 43 (!)  . ALT  04/17/2020 44   . Alk Phos (alkaline Phosp* 04/17/2020 99   . Albumin 04/17/2020 4.5   . Bilirubin, Total 04/17/2020 1.4 (!)  . Protein, Total 04/17/2020 7.0   . A/G Ratio 04/17/2020 1.8      ASSESSMENT: Martin Adkins is a 75 y.o. male presenting for consultation for perirectal abscess.  Patient with perirectal abscess.  Patient currently not septic.  Since patient took his blood thinner this morning it would be reasonable to hold blood thinner for 24 hours and proceed with incision and drainage catheter rectal abscess.  Patient is currently on antibiotic therapy.  No fever no tachycardia.  I discussed the case with PCP who agreed to hold anticoagulation for 48 to 72 hours.  I think that if surgery went well patient will just need to hold anticoagulation for no more than 48 hours.  Patient interbody rates of surgery.  He agreed to proceed.  Perirectal abscess [K61.1]  PLAN: 1. Hold Xarelto 2. Incision and drainage of perirectal abscess (18343) 3. Take antibiotics as prescribed 4. Contact us if you have any concern.   Patient verbalized understanding, all questions were answered,  and were agreeable with the plan outlined above.    Herbert Pun, MD  Electronically signed by Herbert Pun, MD

## 2020-06-13 NOTE — H&P (Signed)
PATIENT PROFILE: Martin Adkins is a 75 y.o. male who presents to the Clinic for consultation at the request of Dr. Edwina Barth for evaluation of perirectal abscess.  PCP:  Velna Ochs, MD  HISTORY OF PRESENT ILLNESS: Martin Adkins reports pain on the perirectal area since 4 days ago.  The patient reported the pain is localized to his perianal area.  There is no pain radiation.  Pain aggravated by pressure.  There has been elevating factors.  Patient denies any fever or chills.  Patient reports purulent drainage.  Patient denies any previous history of abscess.  Patient is currently on Xarelto due to A. fib and history DVT.   PROBLEM LIST: Problem List  Date Reviewed: 06/13/2020         Noted   Dupuytren's contracture of left hand 11/02/2018   CLL (chronic lymphocytic leukemia) (CMS-HCC) 07/08/2017   DVT of deep femoral vein, left (CMS-HCC) 01/23/2015   Atrial fibrillation [I48.91] 08/05/2014   APS (antiphospholipid syndrome) (CMS-HCC) 08/01/2014   H/O deep venous thrombosis 08/01/2014   Benign essential hypertension 5/95/6387   Chronic systolic CHF (congestive heart failure), NYHA class 2 (CMS-HCC) 04/19/2014   Overview    With segmental LV systolic dysfunction ejection fraction of 35%       Abdominal aneurysm without mention of rupture 04/05/2014   CAD (coronary artery disease) 04/05/2014   Overview    Sp cabg with lima to lad svg to d1 om1 rca 2004 with occluded svg to rca and d1 and pci stetn of om1 graft 2015       Hyperplastic polyps of stomach Unknown   Barrett's esophagus Unknown   Diabetes mellitus type 2, uncomplicated (CMS-HCC) Unknown   GERD (gastroesophageal reflux disease) 05/04/2013   Overview    H/o Barrett's esophagus       Mixed hyperlipidemia 05/03/2013   RA (rheumatoid arthritis) (CMS-HCC) (Chronic) 05/03/2013   Overview    a.  Methotrexate.    b.  Positive rheumatoid factor, negative anti-CCP antibodies.   c.  Enbrel.         OSA (obstructive sleep  apnea) (Chronic) 05/03/2013   Tobacco abuse (Chronic) 05/03/2013   PVD (peripheral vascular disease) (CMS-HCC) (Chronic) 05/03/2013   Osteoarthritis 05/03/2013   Overview    a.  Lumbar spine.   b.  Dupuytren's contractures.            GENERAL REVIEW OF SYSTEMS:   General ROS: negative for - chills, fatigue, fever, weight gain or weight loss Allergy and Immunology ROS: negative for - hives  Hematological and Lymphatic ROS: Positive for - bleeding problems or bruising, negative for palpable nodes Endocrine ROS: negative for - heat or cold intolerance, hair changes Respiratory ROS: negative for - cough, shortness of breath or wheezing Cardiovascular ROS: no chest pain or palpitations GI ROS: negative for nausea, vomiting, abdominal pain, diarrhea, constipation Musculoskeletal ROS: Positive for - joint swelling or muscle pain Neurological ROS: negative for - confusion, syncope Dermatological ROS: negative for pruritus and rash Psychiatric: negative for anxiety, depression, difficulty sleeping and memory loss  MEDICATIONS: Current Outpatient Medications  Medication Sig Dispense Refill  . amLODIPine (NORVASC) 10 MG tablet TAKE 1 TABLET EVERY DAY 90 tablet 1  . atorvastatin (LIPITOR) 80 MG tablet TAKE 1 TABLET EVERY DAY 90 tablet 3  . ciprofloxacin HCl (CIPRO) 500 MG tablet Take 1 tablet (500 mg total) by mouth 2 (two) times daily for 10 days 20 tablet 0  . dorzolamide (TRUSOPT) 2 % ophthalmic solution INSTILL ONE  DROP IN [facility] EYE TWICE DAILY    . ergocalciferol, vitamin D2, 1,250 mcg (50,000 unit) capsule Take 1 capsule (50,000 Units total) by mouth once a week 8 capsule 0  . fluticasone propionate (FLONASE) 50 mcg/actuation nasal spray Place 2 sprays into both nostrils once daily 48 g 3  . gabapentin (NEURONTIN) 300 MG capsule TAKE 2 CAPSULES TWICE DAILY (AM AND PM) AND TAKE 1 CAPSULE MIDDAY 180 capsule 1  . isosorbide mononitrate (IMDUR) 60 MG ER tablet TAKE 1 TABLET EVERY DAY 90 tablet  1  . latanoprost (XALATAN) 0.005 % ophthalmic solution INSTILL ONE DROP IN [facility] EYE AT BEDTIME    . losartan (COZAAR) 25 MG tablet Take 1 tablet (25 mg total) by mouth once daily 90 tablet 3  . metoprolol succinate (TOPROL-XL) 100 MG XL tablet TAKE 1 TABLET EVERY DAY 90 tablet 1  . metroNIDAZOLE (FLAGYL) 500 MG tablet Take 1 tablet (500 mg total) by mouth 2 (two) times daily for 10 days 20 tablet 0  . pantoprazole (PROTONIX) 40 MG DR tablet TAKE 1 TABLET EVERY DAY 90 tablet 1  . peg-electrolyte (NULYTELY) solution Take 4,000 mLs by mouth as directed 4000 mL 0  . potassium chloride (KLOR-CON) 20 MEQ ER tablet TAKE 1 TABLET EVERY DAY 90 tablet 1  . traMADoL (ULTRAM) 50 mg tablet 1/2 to 1 po bid prn 14 tablet 0  . VENTOLIN HFA 90 mcg/actuation inhaler Inhale 2 inhalations into the lungs every 6 (six) hours as needed for Wheezing. 1 Inhaler 5  . XARELTO 20 mg tablet TAKE 1 TABLET EVERY DAY 90 tablet 1   No current facility-administered medications for this visit.    ALLERGIES: Ace inhibitors, Crestor [rosuvastatin], Niacin, Penicillins, Pravastatin, Zocor [simvastatin], and Amoxicillin  PAST MEDICAL HISTORY: Past Medical History:  Diagnosis Date  . AAA (abdominal aortic aneurysm) (CMS-HCC)   . Atrial fibrillation (CMS-HCC)   . Barrett's esophagus   . Coronary artery disease   . Diabetes mellitus type 2, uncomplicated (CMS-HCC)   . DVT (deep venous thrombosis) (CMS-HCC)   . GERD (gastroesophageal reflux disease)   . Hiatal hernia   . Hypercholesterolemia   . Hyperplastic polyps of stomach   . Hypertension   . Shingles   . Thrombocytopenia (CMS-HCC)   . Tobacco abuse     PAST SURGICAL HISTORY: Past Surgical History:  Procedure Laterality Date  . ABDOMINAL AORTIC ANEURYSM REPAIR  July 2013  . ANGIOPLASTY / STENTING ILIAC    . CARDIAC CATHETERIZATION    . CHOLECYSTECTOMY    . COLONOSCOPY  11/24/2009, 06/28/2004, 08/28/1998   Hyperplastic Polyps: CBF 11/2019  . CORONARY  ANGIOPLASTY    . CORONARY ANGIOPLASTY WITH STENT PLACEMENT    . CORONARY ARTERY BYPASS GRAFT    . EGD  05/26/2002   Normal  . EGD  03/23/2012, 11/24/2009   Barrett's Esophagus: CBF 03/2015; Recall Ltr mailed 01/27/2015 (dw)  . EGD  10/28/2016   No Barrett's Seen: CBF 10/2021  . PROSTATE SURGERY    . Release of Dupuytren's contracture, left long finger Left 12/09/2018   Dr.Poggi   . UPPER GASTROINTESTINAL ENDOSCOPY       FAMILY HISTORY: Family History  Problem Relation Age of Onset  . Diabetes type II Mother   . Cancer Mother   . Myocardial Infarction (Heart attack) Father      SOCIAL HISTORY: Social History   Socioeconomic History  . Marital status: Legally Separated  Tobacco Use  . Smoking status: Current Some Day Smoker  Packs/day: 1.00    Years: 51.00    Pack years: 51.00    Types: Cigarettes  . Smokeless tobacco: Never Used  . Tobacco comment: currently 0.5 pack/day  Vaping Use  . Vaping Use: Never used  Substance and Sexual Activity  . Alcohol use: Yes    Alcohol/week: 3.0 standard drinks    Types: 2 Glasses of wine, 1 Shots of liquor, 2 Standard drinks or equivalent per week    Comment: socially, not daily couple glasses of wine   . Drug use: No  . Sexual activity: Yes    Partners: Female    Birth control/protection: None    PHYSICAL EXAM: Vitals:   06/13/20 1533  BP: 122/76  Pulse: 87   Body mass index is 27.44 kg/m. Weight: 94.3 kg (208 lb)   GENERAL: Alert, active, oriented x3  HEENT: Pupils equal reactive to light. Extraocular movements are intact. Sclera clear. Palpebral conjunctiva normal red color.Pharynx clear.  NECK: Supple with no palpable mass and no adenopathy.  LUNGS: Sound clear with no rales rhonchi or wheezes.  HEART: Regular rhythm S1 and S2 without murmur.  ABDOMEN: Soft and depressible, nontender with no palpable mass, no hepatomegaly.   RECTAL: There is a palpable tenderness on the left perianal area.  There is erythema.   There is a deep fluid collection.  EXTREMITIES: Well-developed well-nourished symmetrical with no dependent edema.  NEUROLOGICAL: Awake alert oriented, facial expression symmetrical, moving all extremities.  REVIEW OF DATA: I have reviewed the following data today: Office Visit on 05/03/2020  Component Date Value  . Influenza A PCR 05/03/2020 Negative   . Influenza B PCR 05/03/2020 Negative   . SARS-CoV2 PCR 05/03/2020 Negative   Appointment on 04/17/2020  Component Date Value  . Glucose 04/17/2020 105   . Sodium 04/17/2020 141   . Potassium 04/17/2020 4.0   . Chloride 04/17/2020 105   . Carbon Dioxide (CO2) 04/17/2020 28.2   . Urea Nitrogen (BUN) 04/17/2020 14   . Creatinine 04/17/2020 0.8   . Glomerular Filtration Ra* 04/17/2020 115   . Calcium 04/17/2020 9.2   . AST  04/17/2020 43 (!)  . ALT  04/17/2020 44   . Alk Phos (alkaline Phosp* 04/17/2020 99   . Albumin 04/17/2020 4.5   . Bilirubin, Total 04/17/2020 1.4 (!)  . Protein, Total 04/17/2020 7.0   . A/G Ratio 04/17/2020 1.8      ASSESSMENT: Martin Adkins is a 75 y.o. male presenting for consultation for perirectal abscess.  Patient with perirectal abscess.  Patient currently not septic.  Since patient took his blood thinner this morning it would be reasonable to hold blood thinner for 24 hours and proceed with incision and drainage catheter rectal abscess.  Patient is currently on antibiotic therapy.  No fever no tachycardia.  I discussed the case with PCP who agreed to hold anticoagulation for 48 to 72 hours.  I think that if surgery went well patient will just need to hold anticoagulation for no more than 48 hours.  Patient interbody rates of surgery.  He agreed to proceed.  Perirectal abscess [K61.1]  PLAN: 1. Hold Xarelto 2. Incision and drainage of perirectal abscess (18343) 3. Take antibiotics as prescribed 4. Contact us if you have any concern.   Patient verbalized understanding, all questions were answered,  and were agreeable with the plan outlined above.    Herbert Pun, MD  Electronically signed by Herbert Pun, MD

## 2020-06-14 ENCOUNTER — Ambulatory Visit: Payer: Medicare PPO | Admitting: Anesthesiology

## 2020-06-14 ENCOUNTER — Encounter
Admission: RE | Disposition: A | Discharge status: Home / Self Care | Payer: Self-pay | Source: Home / Self Care | Attending: General Surgery

## 2020-06-14 ENCOUNTER — Ambulatory Visit
Admission: RE | Admit: 2020-06-14 | Discharge: 2020-06-14 | Disposition: A | Discharge status: Home / Self Care | Payer: Medicare PPO | Attending: General Surgery | Admitting: General Surgery

## 2020-06-14 ENCOUNTER — Encounter: Payer: Self-pay | Admitting: General Surgery

## 2020-06-14 ENCOUNTER — Other Ambulatory Visit: Payer: Self-pay

## 2020-06-14 DIAGNOSIS — K611 Rectal abscess: Secondary | ICD-10-CM | POA: Insufficient documentation

## 2020-06-14 DIAGNOSIS — Z7901 Long term (current) use of anticoagulants: Secondary | ICD-10-CM | POA: Diagnosis not present

## 2020-06-14 DIAGNOSIS — Z86718 Personal history of other venous thrombosis and embolism: Secondary | ICD-10-CM | POA: Diagnosis not present

## 2020-06-14 DIAGNOSIS — Z79899 Other long term (current) drug therapy: Secondary | ICD-10-CM | POA: Insufficient documentation

## 2020-06-14 DIAGNOSIS — Z888 Allergy status to other drugs, medicaments and biological substances status: Secondary | ICD-10-CM | POA: Insufficient documentation

## 2020-06-14 DIAGNOSIS — I4891 Unspecified atrial fibrillation: Secondary | ICD-10-CM | POA: Diagnosis not present

## 2020-06-14 DIAGNOSIS — I5022 Chronic systolic (congestive) heart failure: Secondary | ICD-10-CM | POA: Insufficient documentation

## 2020-06-14 DIAGNOSIS — F17210 Nicotine dependence, cigarettes, uncomplicated: Secondary | ICD-10-CM | POA: Diagnosis not present

## 2020-06-14 DIAGNOSIS — Z951 Presence of aortocoronary bypass graft: Secondary | ICD-10-CM | POA: Insufficient documentation

## 2020-06-14 DIAGNOSIS — Z880 Allergy status to penicillin: Secondary | ICD-10-CM | POA: Insufficient documentation

## 2020-06-14 DIAGNOSIS — I110 Hypertensive heart disease with heart failure: Secondary | ICD-10-CM | POA: Diagnosis not present

## 2020-06-14 HISTORY — PX: INCISION AND DRAINAGE ABSCESS: SHX5864

## 2020-06-14 SURGERY — INCISION AND DRAINAGE, ABSCESS
Anesthesia: General | Site: Buttocks

## 2020-06-14 MED ORDER — BUPIVACAINE-EPINEPHRINE (PF) 0.5% -1:200000 IJ SOLN
INTRAMUSCULAR | Status: DC | PRN
  Administered 2020-06-14: 30 mL

## 2020-06-14 MED ORDER — ACETAMINOPHEN 10 MG/ML IV SOLN
INTRAVENOUS | Status: DC | PRN
  Administered 2020-06-14: 1000 mg via INTRAVENOUS

## 2020-06-14 MED ORDER — ACETAMINOPHEN 10 MG/ML IV SOLN
INTRAVENOUS | Status: AC
  Filled 2020-06-14: qty 100

## 2020-06-14 MED ORDER — FENTANYL CITRATE (PF) 100 MCG/2ML IJ SOLN
INTRAMUSCULAR | Status: AC
  Filled 2020-06-14: qty 2

## 2020-06-14 MED ORDER — LACTATED RINGERS IV SOLN: INTRAVENOUS | Status: DC

## 2020-06-14 MED ORDER — FENTANYL CITRATE (PF) 100 MCG/2ML IJ SOLN: 25.0000 ug | INTRAMUSCULAR | Status: DC | PRN

## 2020-06-14 MED ORDER — FENTANYL CITRATE (PF) 100 MCG/2ML IJ SOLN
INTRAMUSCULAR | Status: DC | PRN
  Administered 2020-06-14 (×2): 50 ug via INTRAVENOUS

## 2020-06-14 MED ORDER — OXYCODONE HCL 5 MG/5ML PO SOLN: 5.0000 mg | Freq: Once | ORAL | Status: DC | PRN

## 2020-06-14 MED ORDER — MEPERIDINE HCL 25 MG/ML IJ SOLN: 6.2500 mg | INTRAMUSCULAR | Status: DC | PRN

## 2020-06-14 MED ORDER — LIDOCAINE HCL (CARDIAC) PF 100 MG/5ML IV SOSY
PREFILLED_SYRINGE | INTRAVENOUS | Status: DC | PRN
  Administered 2020-06-14: 100 mg via INTRAVENOUS

## 2020-06-14 MED ORDER — OXYCODONE HCL 5 MG PO TABS: 5.0000 mg | ORAL_TABLET | Freq: Once | ORAL | Status: DC | PRN

## 2020-06-14 MED ORDER — EPHEDRINE SULFATE 50 MG/ML IJ SOLN
INTRAMUSCULAR | Status: DC | PRN
  Administered 2020-06-14: 10 mg via INTRAVENOUS

## 2020-06-14 MED ORDER — LACTATED RINGERS IV SOLN: INTRAVENOUS | Status: DC | PRN

## 2020-06-14 MED ORDER — PROMETHAZINE HCL 25 MG/ML IJ SOLN: 6.2500 mg | INTRAMUSCULAR | Status: DC | PRN

## 2020-06-14 MED ORDER — ROCURONIUM BROMIDE 100 MG/10ML IV SOLN
INTRAVENOUS | Status: DC | PRN
  Administered 2020-06-14: 50 mg via INTRAVENOUS

## 2020-06-14 MED ORDER — ORAL CARE MOUTH RINSE: 15.0000 mL | Freq: Once | OROMUCOSAL | Status: AC

## 2020-06-14 MED ORDER — CHLORHEXIDINE GLUCONATE 0.12 % MT SOLN
OROMUCOSAL | Status: AC
  Administered 2020-06-14: 15 mL via OROMUCOSAL
  Filled 2020-06-14: qty 15

## 2020-06-14 MED ORDER — CHLORHEXIDINE GLUCONATE 0.12 % MT SOLN: 15.0000 mL | Freq: Once | OROMUCOSAL | Status: AC

## 2020-06-14 MED ORDER — PROPOFOL 10 MG/ML IV BOLUS
INTRAVENOUS | Status: DC | PRN
  Administered 2020-06-14: 120 mg via INTRAVENOUS

## 2020-06-14 MED ORDER — SUGAMMADEX SODIUM 500 MG/5ML IV SOLN
INTRAVENOUS | Status: DC | PRN
  Administered 2020-06-14: 200 mg via INTRAVENOUS

## 2020-06-14 MED ORDER — HYDROCODONE-ACETAMINOPHEN 5-325 MG PO TABS: 1.0000 | ORAL_TABLET | ORAL | 0 refills | Status: AC | PRN

## 2020-06-14 MED ORDER — CIPROFLOXACIN IN D5W 400 MG/200ML IV SOLN
INTRAVENOUS | Status: AC
  Administered 2020-06-14: 400 mg via INTRAVENOUS
  Filled 2020-06-14: qty 200

## 2020-06-14 MED ORDER — BUPIVACAINE-EPINEPHRINE (PF) 0.5% -1:200000 IJ SOLN
INTRAMUSCULAR | Status: AC
  Filled 2020-06-14: qty 30

## 2020-06-14 SURGICAL SUPPLY — 28 items
BLADE CLIPPER SURG (BLADE) ×2 IMPLANT
BLADE SURG 15 STRL LF DISP TIS (BLADE) ×1 IMPLANT
BLADE SURG 15 STRL SS (BLADE) ×1
BRIEF STRETCH FOR OB PAD XXL (UNDERPADS AND DIAPERS) ×1 IMPLANT
BRUSH SCRUB EZ  4% CHG (MISCELLANEOUS) ×1
BRUSH SCRUB EZ 4% CHG (MISCELLANEOUS) ×1 IMPLANT
COVER WAND RF STERILE (DRAPES) ×2 IMPLANT
DRAPE CHEST BREAST 77X106 FENE (MISCELLANEOUS) IMPLANT
DRAPE LAPAROTOMY 77X122 PED (DRAPES) ×2 IMPLANT
DRAPE LEGGINS SURG 28X43 STRL (DRAPES) ×1 IMPLANT
DRAPE UNDER BUTTOCK W/FLU (DRAPES) ×1 IMPLANT
DRSG GAUZE FLUFF 36X18 (GAUZE/BANDAGES/DRESSINGS) ×1 IMPLANT
ELECT REM PT RETURN 9FT ADLT (ELECTROSURGICAL) ×2
ELECTRODE REM PT RTRN 9FT ADLT (ELECTROSURGICAL) ×1 IMPLANT
GAUZE PACKING IODOFORM 1X5 (PACKING) ×1 IMPLANT
GLOVE SURG ENC MOIS LTX SZ6.5 (GLOVE) ×2 IMPLANT
GLOVE SURG UNDER POLY LF SZ6.5 (GLOVE) ×2 IMPLANT
GOWN STRL REUS W/ TWL LRG LVL3 (GOWN DISPOSABLE) ×2 IMPLANT
GOWN STRL REUS W/TWL LRG LVL3 (GOWN DISPOSABLE) ×2
MANIFOLD NEPTUNE II (INSTRUMENTS) ×2 IMPLANT
NEEDLE HYPO 22GX1.5 SAFETY (NEEDLE) ×2 IMPLANT
NS IRRIG 1000ML POUR BTL (IV SOLUTION) ×2 IMPLANT
PACK BASIN MINOR ARMC (MISCELLANEOUS) ×2 IMPLANT
SOL PREP PVP 2OZ (MISCELLANEOUS) ×4
SOLUTION PREP PVP 2OZ (MISCELLANEOUS) ×2 IMPLANT
SPONGE LAP 18X18 RF (DISPOSABLE) ×2 IMPLANT
SWAB CULTURE AMIES ANAERIB BLU (MISCELLANEOUS) ×1 IMPLANT
SYR 20ML LL LF (SYRINGE) ×1 IMPLANT

## 2020-06-14 NOTE — Discharge Instructions (Signed)
AMBULATORY SURGERY  DISCHARGE INSTRUCTIONS   1) The drugs that you were given will stay in your system until tomorrow so for the next 24 hours you should not:  A) Drive an automobile B) Make any legal decisions C) Drink any alcoholic beverage   2) You may resume regular meals tomorrow.  Today it is better to start with liquids and gradually work up to solid foods.  You may eat anything you prefer, but it is better to start with liquids, then soup and crackers, and gradually work up to solid foods.   3) Please notify your doctor immediately if you have any unusual bleeding, trouble breathing, redness and pain at the surgery site, drainage, fever, or pain not relieved by medication.    4) Additional Instructions:        Please contact your physician with any problems or Same Day Surgery at [phone], Monday through Friday 6 am to 4 pm, or [place] at [facility] number at [phone].AMBULATORY SURGERY  DISCHARGE INSTRUCTIONS   5) The drugs that you were given will stay in your system until tomorrow so for the next 24 hours you should not:  D) Drive an automobile E) Make any legal decisions F) Drink any alcoholic beverage   6) You may resume regular meals tomorrow.  Today it is better to start with liquids and gradually work up to solid foods.  You may eat anything you prefer, but it is better to start with liquids, then soup and crackers, and gradually work up to solid foods.   7) Please notify your doctor immediately if you have any unusual bleeding, trouble breathing, redness and pain at the surgery site, drainage, fever, or pain not relieved by medication.    8) Additional Instructions:        Please contact your physician with any problems or Same Day Surgery at [phone], Monday through Friday 6 am to 4 pm, or [place] at [facility] number at [phone]. Diet: Resume home heart healthy regular diet.   Activity: Increase activity as  tolerated, but light activity and walking are encouraged. Do not drive or drink alcohol if taking narcotic pain medications.  Wound care: Remove packing tomorrow. Once dressing removed, may shower with soapy water and pat dry (do not rub incisions), but no baths or submerging incision underwater until follow-up. (no swimming)   Medications: Resume all home medications. For mild to moderate pain: acetaminophen (Tylenol) or ibuprofen (if no kidney disease). Combining Tylenol with alcohol can substantially increase your risk of causing liver disease. Narcotic pain medications, if prescribed, can be used for severe pain, though may cause nausea, constipation, and drowsiness. Do not combine Tylenol and Norco within a 6 hour period as Norco contains Tylenol. If you do not need the narcotic pain medication, you do not need to fill the prescription.  Call office [phone]) at any time if any questions, worsening pain, fevers/chills, bleeding, drainage from incision site, or other concerns.

## 2020-06-14 NOTE — Interval H&P Note (Signed)
History and Physical Interval Note:  06/14/2020 9:35 AM  Martin Adkins  has presented today for surgery, with the diagnosis of K61.1 Perirectal abscess.  The various methods of treatment have been discussed with the patient and family. After consideration of risks, benefits and other options for treatment, the patient has consented to  Procedure(s): INCISION AND DRAINAGE ABSCESS (N/A) (perirectal) as a surgical intervention.  The patient's history has been reviewed, patient examined, no change in status, stable for surgery.  I have reviewed the patient's chart and labs.  Questions were answered to the patient's satisfaction.     Herbert Pun

## 2020-06-14 NOTE — Anesthesia Procedure Notes (Signed)
Procedure Name: Intubation Date/Time: 06/14/2020 10:40 AM Performed by: Allean Found, CRNA Pre-anesthesia Checklist: Patient identified, Patient being monitored, Timeout performed, Emergency Drugs available and Suction available Patient Re-evaluated:Patient Re-evaluated prior to induction Oxygen Delivery Method: Circle system utilized Preoxygenation: Pre-oxygenation with 100% oxygen Induction Type: IV induction Ventilation: Mask ventilation without difficulty Laryngoscope Size: McGraph and 4 Grade View: Grade I Tube type: Oral Tube size: 7.5 mm Number of attempts: 1 Airway Equipment and Method: Stylet Placement Confirmation: ETT inserted through vocal cords under direct vision,  positive ETCO2 and breath sounds checked- equal and bilateral Secured at: 21 cm Tube secured with: Tape Dental Injury: Teeth and Oropharynx as per pre-operative assessment

## 2020-06-14 NOTE — Transfer of Care (Signed)
Immediate Anesthesia Transfer of Care Note  Patient: Martin Adkins  Procedure(s) Performed: INCISION AND DRAINAGE ABSCESS (N/A Buttocks)  Patient Location: PACU  Anesthesia Type:General  Level of Consciousness: awake, alert  and oriented  Airway & Oxygen Therapy: Patient Spontanous Breathing and Patient connected to face mask oxygen  Post-op Assessment: Report given to RN and Post -op Vital signs reviewed and stable  Post vital signs: Reviewed and stable  Last Vitals:  Vitals Value Taken Time  BP 115/67 06/14/20 1130  Temp 36.4 C 06/14/20 1124  Pulse 63 06/14/20 1133  Resp 20 06/14/20 1133  SpO2 98 % 06/14/20 1133  Vitals shown include unvalidated device data.  Last Pain:  Vitals:   06/14/20 1124  TempSrc:   PainSc: 0-No pain         Complications: No complications documented.

## 2020-06-14 NOTE — Op Note (Signed)
Preoperative diagnosis: Perirectal abscess   Postoperative diagnosis: Perirectal abscess  Procedure: Rectal Exam under anesthesia                      Incision and drainage of perirectal.  Anesthesia: GETA  Surgeon: Dr. Windell Moment, MD  Wound Classification: Contaminated  Indications: Patient is a 75 y.o. male  presented with an abscess on the perirectal area not improving with antibiotic therapy  Findings:  1. Abscess on the perirectal area 2. Abundant purulent secretions drained and cultured 3. Adequate hemostasis.   Description of procedure: The patient was placed in the lithotomy position and GETA was induced. The perirectal area was prepped and draped in the usual sterile fashion. A timeout was completed verifying correct patient, procedure, site, positioning, and implant(s) and/or special equipment prior to beginning this procedure.  Digital rectal exam was done. Relaxed sphincter was noted. With anoscope, the rectum was inspected. There was no mass or lesions identified. There was no opening suggesting fistula.  A skin incision was made on the most fluctuant area. The wound was opened and purulent secretions was drained. With a hemostat blunt dissection of septas was done to be able to drain the abscess completely. With the finger the perirectal area was inspected and no other cavities was found. The cavity flushed with saline until clear saline was aspirated. The wound was packed with an iodine packing and wound left opened. Sterile dressing left in place. Local anesthesia infiltrated away of the infected area to block the area.  The patient tolerated the procedure well and was taken to the postanesthesia care unit in satisfactory condition.   Specimen: None   Complications: None  Estimated Blood Loss: 5 mL

## 2020-06-14 NOTE — Anesthesia Preprocedure Evaluation (Signed)
Anesthesia Evaluation  Patient identified by MRN, date of birth, ID band Patient awake    Reviewed: Allergy & Precautions, NPO status , Patient's Chart, lab work & pertinent test results  History of Anesthesia Complications Negative for: history of anesthetic complications  Airway Mallampati: II  TM Distance: >3 FB Neck ROM: Full    Dental  (+) Poor Dentition   Pulmonary sleep apnea , neg COPD, Current Smoker and Patient abstained from smoking.,    breath sounds clear to auscultation- rhonchi (-) wheezing      Cardiovascular hypertension, + CAD, + Past MI, + Cardiac Stents, + CABG (1998) and +CHF  + dysrhythmias Atrial Fibrillation  Rhythm:Regular Rate:Normal - Systolic murmurs and - Diastolic murmurs Echo 1/61/09: MODERATE LV SYSTOLIC DYSFUNCTION   NORMAL RIGHT VENTRICULAR SYSTOLIC FUNCTION  MILD VALVULAR REGURGITATION  NO VALVULAR STENOSIS  MILD AR, TR, PR  TRIVIAL MR  EF 35-40%    L heart cath 01/19/18: 90% first diagonal.  RCA had an acute marginal with a 99% stenosis.  RCA had no significant disease.  Vein graft to RCA is chronically occluded.  Vein graft to diagonal is chronically occluded.  Vein graft to OM has a stent with proximal occlusion.  LIMA to the LAD is widely patent.  Revealed films suggest medical management.  Not amenable to PCI or further coronary bypass grafting.  EF 35 to 45% globally.  Cardiac cath completed with no immediate complications.  Patient has occluded proximal circumflex, occluded proximal to mid LAD.   Neuro/Psych neg Seizures negative neurological ROS  negative psych ROS   GI/Hepatic Neg liver ROS, hiatal hernia, GERD  ,  Endo/Other  diabetes  Renal/GU negative Renal ROS     Musculoskeletal  (+) Arthritis ,   Abdominal (+) - obese,   Peds  Hematology negative hematology ROS (+)   Anesthesia Other Findings Past Medical History: 04/05/2014: AAA (abdominal aortic aneurysm) without  rupture ([place]) 04/05/2014: AAA (abdominal aortic aneurysm) without rupture ([facility]) No date: Abdominal aortic aneurysm without rupture ([place]) 08/15/2014: Acute non-ST elevation myocardial infarction (NSTEMI) ([place]) 07/28/2014: Antepartum deep phlebothrombosis No date: APS (antiphospholipid syndrome) ([place]) 04/05/2014: Arteriosclerosis of coronary artery     Comment:  Overview:  Sp cabg with lima to lad svg to d1 om1 rca               2004 with occluded svg to rca and d1 and pci stetn of om1              graft 2015  No date: Arthritis 03/21/2017: B12 deficiency No date: Barrett's esophagus 05/02/2014: Benign essential HTN No date: BPH (benign prostatic hyperplasia) No date: CAD (coronary artery disease) No date: CHF (congestive heart failure) ([place]) 06/11/5407: Chronic systolic heart failure ([place])     Comment:  Overview:  With segmental LV systolic dysfunction               ejection fraction of 35%  No date: CLL (chronic lymphocytic leukemia) ([place]) 05/24/2017: CLL (chronic lymphocytic leukemia) (HCC) No date: DVT (deep venous thrombosis) (HCC) No date: Dysrhythmia     Comment:  ATRIAL FIB. No date: GERD (gastroesophageal reflux disease) No date: History of hiatal hernia 2013: History of shingles No date: Hyperlipemia No date: Hyperplastic polyps of stomach No date: Hypertension 07/30/2014: Myocardial infarction ([place]) No date: NSTEMI (non-ST elevated myocardial infarction) ([place]) No date: OSA (obstructive sleep apnea)     Comment:  does not use CPAP No date: Osteoarthritis No date: Pneumonia No date: Pre-diabetes No  date: PVD (peripheral vascular disease) ([place]) No date: RA (rheumatoid arthritis) (HCC) No date: SOB (shortness of breath)   Reproductive/Obstetrics                             Anesthesia Physical Anesthesia Plan  ASA: III  Anesthesia Plan: General   Post-op Pain Management:    Induction: Intravenous  PONV Risk Score and Plan: 0 and  Ondansetron  Airway Management Planned: Oral ETT  Additional Equipment:   Intra-op Plan:   Post-operative Plan: Extubation in OR  Informed Consent: I have reviewed the patients History and Physical, chart, labs and discussed the procedure including the risks, benefits and alternatives for the proposed anesthesia with the patient or authorized representative who has indicated his/her understanding and acceptance.     Dental advisory given  Plan Discussed with: CRNA and Anesthesiologist  Anesthesia Plan Comments:         Anesthesia Quick Evaluation

## 2020-06-15 ENCOUNTER — Encounter: Payer: Self-pay | Admitting: General Surgery

## 2020-06-16 NOTE — Anesthesia Postprocedure Evaluation (Signed)
Anesthesia Post Note  Patient: [name]  Procedure(s) Performed: INCISION AND DRAINAGE ABSCESS (Buttocks)  Patient location during evaluation: PACU Anesthesia Type: General Level of consciousness: awake and alert and oriented Pain management: pain level controlled Vital Signs Assessment: post-procedure vital signs reviewed and stable Respiratory status: spontaneous breathing, nonlabored ventilation and respiratory function stable Cardiovascular status: blood pressure returned to baseline and stable Postop Assessment: no signs of nausea or vomiting Anesthetic complications: no   No notable events documented.   Last Vitals:  Vitals:   06/14/20 1145 06/14/20 1201  BP: 111/72 114/78  Pulse: 64 63  Resp: 13   Temp: (!) 36.3 C   SpO2: 97% 97%    Last Pain:  Vitals:   06/14/20 1201  TempSrc:   PainSc: 0-No pain                 Yalexa Blust

## 2020-06-19 LAB — AEROBIC/ANAEROBIC CULTURE W GRAM STAIN (SURGICAL/DEEP WOUND): Gram Stain: NONE SEEN

## 2020-07-21 ENCOUNTER — Encounter: Payer: Self-pay | Admitting: Internal Medicine

## 2020-07-24 ENCOUNTER — Encounter: Payer: Self-pay | Admitting: Internal Medicine

## 2020-07-24 ENCOUNTER — Ambulatory Visit: Payer: Medicare PPO | Admitting: Registered Nurse

## 2020-07-24 ENCOUNTER — Other Ambulatory Visit: Payer: Self-pay

## 2020-07-24 ENCOUNTER — Ambulatory Visit
Admission: RE | Admit: 2020-07-24 | Discharge: 2020-07-24 | Disposition: A | Discharge status: Home / Self Care | Payer: Medicare PPO | Attending: Internal Medicine | Admitting: Internal Medicine

## 2020-07-24 ENCOUNTER — Encounter
Admission: RE | Disposition: A | Discharge status: Home / Self Care | Payer: Self-pay | Source: Home / Self Care | Attending: Internal Medicine

## 2020-07-24 DIAGNOSIS — D122 Benign neoplasm of ascending colon: Secondary | ICD-10-CM | POA: Diagnosis not present

## 2020-07-24 DIAGNOSIS — Z7901 Long term (current) use of anticoagulants: Secondary | ICD-10-CM | POA: Insufficient documentation

## 2020-07-24 DIAGNOSIS — D123 Benign neoplasm of transverse colon: Secondary | ICD-10-CM | POA: Insufficient documentation

## 2020-07-24 DIAGNOSIS — Z1211 Encounter for screening for malignant neoplasm of colon: Secondary | ICD-10-CM | POA: Diagnosis not present

## 2020-07-24 DIAGNOSIS — K5730 Diverticulosis of large intestine without perforation or abscess without bleeding: Secondary | ICD-10-CM | POA: Insufficient documentation

## 2020-07-24 DIAGNOSIS — K640 First degree hemorrhoids: Secondary | ICD-10-CM | POA: Diagnosis not present

## 2020-07-24 DIAGNOSIS — D3A8 Other benign neuroendocrine tumors: Secondary | ICD-10-CM | POA: Insufficient documentation

## 2020-07-24 DIAGNOSIS — D124 Benign neoplasm of descending colon: Secondary | ICD-10-CM | POA: Diagnosis not present

## 2020-07-24 DIAGNOSIS — Z79899 Other long term (current) drug therapy: Secondary | ICD-10-CM | POA: Insufficient documentation

## 2020-07-24 DIAGNOSIS — C7A8 Other malignant neuroendocrine tumors: Secondary | ICD-10-CM

## 2020-07-24 HISTORY — PX: COLONOSCOPY WITH PROPOFOL: SHX5780

## 2020-07-24 HISTORY — DX: Other malignant neuroendocrine tumors: C7A.8

## 2020-07-24 SURGERY — COLONOSCOPY WITH PROPOFOL
Anesthesia: General

## 2020-07-24 MED ORDER — PROPOFOL 10 MG/ML IV BOLUS
INTRAVENOUS | Status: DC | PRN
  Administered 2020-07-24: 60 mg via INTRAVENOUS

## 2020-07-24 MED ORDER — SODIUM CHLORIDE 0.9 % IV SOLN: INTRAVENOUS | Status: DC

## 2020-07-24 MED ORDER — PROPOFOL 500 MG/50ML IV EMUL
INTRAVENOUS | Status: DC | PRN
  Administered 2020-07-24: 140 ug/kg/min via INTRAVENOUS

## 2020-07-24 NOTE — Transfer of Care (Signed)
Immediate Anesthesia Transfer of Care Note  Patient: Martin Adkins  Procedure(s) Performed: COLONOSCOPY WITH PROPOFOL  Patient Location: PACU  Anesthesia Type:General  Level of Consciousness: sedated  Airway & Oxygen Therapy: Patient Spontanous Breathing  Post-op Assessment: Report given to RN and Post -op Vital signs reviewed and stable  Post vital signs: Reviewed and stable  Last Vitals:  Vitals Value Taken Time  BP 98/75 07/24/20 1422  Temp    Pulse 68 07/24/20 1422  Resp 22 07/24/20 1422  SpO2 95 % 07/24/20 1422  Vitals shown include unvalidated device data.  Last Pain:  Vitals:   07/24/20 1422  TempSrc:   PainSc: 0-No pain         Complications: No notable events documented.

## 2020-07-24 NOTE — Anesthesia Postprocedure Evaluation (Signed)
Anesthesia Post Note  Patient: [name]  Procedure(s) Performed: COLONOSCOPY WITH PROPOFOL  Patient location during evaluation: Endoscopy Anesthesia Type: General Level of consciousness: awake and alert Pain management: pain level controlled Vital Signs Assessment: post-procedure vital signs reviewed and stable Respiratory status: spontaneous breathing, nonlabored ventilation, respiratory function stable and patient connected to nasal cannula oxygen Cardiovascular status: blood pressure returned to baseline and stable Postop Assessment: no apparent nausea or vomiting Anesthetic complications: no   No notable events documented.   Last Vitals:  Vitals:   07/24/20 1442 07/24/20 1452  BP: 130/83   Pulse: 66 (!) 56  Resp: 15 18  Temp:    SpO2: 98% 100%    Last Pain:  Vitals:   07/24/20 1452  TempSrc:   PainSc: 0-No pain                 Arita Miss

## 2020-07-24 NOTE — Anesthesia Preprocedure Evaluation (Signed)
Anesthesia Evaluation  Patient identified by MRN, date of birth, ID band Patient awake    Reviewed: Allergy & Precautions, NPO status , Patient's Chart, lab work & pertinent test results  History of Anesthesia Complications Negative for: history of anesthetic complications  Airway Mallampati: II  TM Distance: >3 FB Neck ROM: Full    Dental  (+) Poor Dentition, Missing   Pulmonary sleep apnea , neg COPD, Current Smoker and Patient abstained from smoking.,    breath sounds clear to auscultation- rhonchi (-) wheezing      Cardiovascular hypertension, + CAD, + Past MI, + Cardiac Stents, + CABG (1998) and +CHF  + dysrhythmias Atrial Fibrillation  Rhythm:Regular Rate:Normal - Systolic murmurs and - Diastolic murmurs Echo 1/61/09: MODERATE LV SYSTOLIC DYSFUNCTION   NORMAL RIGHT VENTRICULAR SYSTOLIC FUNCTION  MILD VALVULAR REGURGITATION  NO VALVULAR STENOSIS  MILD AR, TR, PR  TRIVIAL MR  EF 35-40%    L heart cath 01/19/18: 90% first diagonal.  RCA had an acute marginal with a 99% stenosis.  RCA had no significant disease.  Vein graft to RCA is chronically occluded.  Vein graft to diagonal is chronically occluded.  Vein graft to OM has a stent with proximal occlusion.  LIMA to the LAD is widely patent.  Revealed films suggest medical management.  Not amenable to PCI or further coronary bypass grafting.  EF 35 to 45% globally.  Cardiac cath completed with no immediate complications.  Patient has occluded proximal circumflex, occluded proximal to mid LAD.   Neuro/Psych neg Seizures negative neurological ROS  negative psych ROS   GI/Hepatic Neg liver ROS, hiatal hernia, GERD  ,  Endo/Other  diabetes  Renal/GU negative Renal ROS     Musculoskeletal  (+) Arthritis ,   Abdominal (+) - obese,   Peds  Hematology negative hematology ROS (+)   Anesthesia Other Findings Past Medical History: 04/05/2014: AAA (abdominal aortic  aneurysm) without rupture ([place]) 04/05/2014: AAA (abdominal aortic aneurysm) without rupture (HCC) No date: Abdominal aortic aneurysm without rupture ([place]) 08/15/2014: Acute non-ST elevation myocardial infarction (NSTEMI) ([place]) 07/28/2014: Antepartum deep phlebothrombosis No date: APS (antiphospholipid syndrome) ([place]) 04/05/2014: Arteriosclerosis of coronary artery     Comment:  Overview:  Sp cabg with lima to lad svg to d1 om1 rca               2004 with occluded svg to rca and d1 and pci stetn of om1              graft 2015  No date: Arthritis 03/21/2017: B12 deficiency No date: Barrett's esophagus 05/02/2014: Benign essential HTN No date: BPH (benign prostatic hyperplasia) No date: CAD (coronary artery disease) No date: CHF (congestive heart failure) ([place]) 06/11/5407: Chronic systolic heart failure ([place])     Comment:  Overview:  With segmental LV systolic dysfunction               ejection fraction of 35%  No date: CLL (chronic lymphocytic leukemia) ([place]) 05/24/2017: CLL (chronic lymphocytic leukemia) (HCC) No date: DVT (deep venous thrombosis) (HCC) No date: Dysrhythmia     Comment:  ATRIAL FIB. No date: GERD (gastroesophageal reflux disease) No date: History of hiatal hernia 2013: History of shingles No date: Hyperlipemia No date: Hyperplastic polyps of stomach No date: Hypertension 07/30/2014: Myocardial infarction ([place]) No date: NSTEMI (non-ST elevated myocardial infarction) ([place]) No date: OSA (obstructive sleep apnea)     Comment:  does not use CPAP No date: Osteoarthritis No date: Pneumonia No date: Pre-diabetes  No date: PVD (peripheral vascular disease) (HCC) No date: RA (rheumatoid arthritis) (HCC) No date: SOB (shortness of breath)   Reproductive/Obstetrics                             Anesthesia Physical  Anesthesia Plan  ASA: 3  Anesthesia Plan: General   Post-op Pain Management:    Induction: Intravenous  PONV Risk Score and Plan: 1  and Ondansetron  Airway Management Planned: Oral ETT  Additional Equipment: None  Intra-op Plan:   Post-operative Plan: Extubation in OR  Informed Consent: I have reviewed the patients History and Physical, chart, labs and discussed the procedure including the risks, benefits and alternatives for the proposed anesthesia with the patient or authorized representative who has indicated his/her understanding and acceptance.     Dental advisory given  Plan Discussed with: CRNA and Anesthesiologist  Anesthesia Plan Comments: (Discussed risks of anesthesia with patient, including possibility of difficulty with spontaneous ventilation under anesthesia necessitating airway intervention, PONV, and rare risks such as cardiac or respiratory or neurological events. Patient understands.)        Anesthesia Quick Evaluation

## 2020-07-24 NOTE — H&P (Signed)
Outpatient short stay form Pre-procedure 07/24/2020 1:46 PM Martin Adkins K. Alice Reichert, M.D.  Primary Physician: Harrel Lemon, M.D.  Reason for visit:  Colon cancer screening  History of present illness:  Patient presents for colonoscopy for colon cancer screening. The patient denies complaints of abdominal pain, significant change in bowel habits, or rectal bleeding.      Current Facility-Administered Medications:    0.9 %  sodium chloride infusion, , Intravenous, Continuous, [place], Benay Pike, MD, Last Rate: 20 mL/hr at 07/24/20 1346, Continued from Pre-op at 07/24/20 1346  Medications Prior to Admission  Medication Sig Dispense Refill Last Dose   amLODipine (NORVASC) 10 MG tablet Take 10 mg by mouth daily.    07/23/2020   atorvastatin (LIPITOR) 80 MG tablet Take 80 mg by mouth daily.   11 07/23/2020   dorzolamide (TRUSOPT) 2 % ophthalmic solution Place 1 drop into both eyes 2 (two) times daily.    07/23/2020   fluticasone (FLONASE) 50 MCG/ACT nasal spray Place 2 sprays into both nostrils daily.    07/23/2020   gabapentin (NEURONTIN) 300 MG capsule Take 1 capsule (300 mg total) by mouth 3 (three) times daily. (Patient taking differently: Take 300 mg by mouth 2 (two) times daily. 2 tabs BID) 90 capsule 0 07/23/2020   Ipratropium-Albuterol (COMBIVENT) 20-100 MCG/ACT AERS respimat Inhale 1 puff into the lungs every 6 (six) hours. 1 Inhaler 11 07/23/2020   isosorbide mononitrate (IMDUR) 60 MG 24 hr tablet Take 1 tablet (60 mg total) by mouth daily. 30 tablet 0 07/23/2020   latanoprost (XALATAN) 0.005 % ophthalmic solution Place 1 drop into both eyes at bedtime.    07/23/2020   losartan (COZAAR) 25 MG tablet Take 25 mg by mouth daily.   07/23/2020   metoprolol succinate (TOPROL-XL) 100 MG 24 hr tablet Take 100 mg by mouth daily.    07/23/2020   metroNIDAZOLE (FLAGYL) 500 MG tablet Take 500 mg by mouth 2 (two) times daily. For 10 days   07/23/2020   Multiple Vitamins-Minerals (MULTIVITAMIN MEN 50+ PO) Take 1  tablet by mouth daily.   Past Week   pantoprazole (PROTONIX) 40 MG tablet Take 40 mg by mouth daily.    07/23/2020   potassium chloride SA (K-DUR,KLOR-CON) 20 MEQ tablet Take 20 mEq by mouth daily.    07/23/2020   vitamin B-12 (CYANOCOBALAMIN) 1000 MCG tablet    Past Week   VITAMIN D, CHOLECALCIFEROL, PO Take 1,000 Units/day by mouth daily.   Past Week   albuterol (PROVENTIL HFA;VENTOLIN HFA) 108 (90 Base) MCG/ACT inhaler Inhale 2 puffs into the lungs every 6 (six) hours as needed for wheezing or shortness of breath. 1 Inhaler 0    baclofen (LIORESAL) 10 MG tablet       ciprofloxacin (CIPRO) 500 MG tablet Take 500 mg by mouth 2 (two) times daily. For 10 days      HYDROcodone-acetaminophen (NORCO) 5-325 MG tablet Take 1-2 tablets by mouth every 6 (six) hours as needed for moderate pain. MAXIMUM TOTAL ACETAMINOPHEN DOSE IS 4000 MG PER DAY 30 tablet 0    rivaroxaban (XARELTO) 20 MG TABS tablet Take 1 tablet (20 mg total) by mouth daily with supper. 30 tablet 3 07/20/2020     Allergies  Allergen Reactions   Ace Inhibitors Other (See Comments)    Other reaction(s): Cough   Amoxicillin Rash    Has patient had a PCN reaction causing immediate rash, facial/tongue/throat swelling, SOB or lightheadedness with hypotension: Yes Has patient had a PCN reaction causing severe  rash involving mucus membranes or skin necrosis: Unknown Has patient had a PCN reaction that required hospitalization: Unknown Has patient had a PCN reaction occurring within the last 10 years: No If all of the above answers are "NO", then may proceed with Cephalosporin use.   Niacin Rash   Penicillins Rash    Has patient had a PCN reaction causing immediate rash, facial/tongue/throat swelling, SOB or lightheadedness with hypotension: Yes Has patient had a PCN reaction causing severe rash involving mucus membranes or skin necrosis: Unknown Has patient had a PCN reaction that required hospitalization: Unknown Has patient had a PCN  reaction occurring within the last 10 years: No If all of the above answers are "NO", then may proceed with Cephalosporin use.   Pravastatin Other (See Comments)    Other reaction(s): Muscle Pain   Rosuvastatin Other (See Comments)    Other reaction(s): Other (See Comments) GI bleed   Simvastatin Other (See Comments)    Other reaction(s): Muscle Pain     Past Medical History:  Diagnosis Date   AAA (abdominal aortic aneurysm) without rupture ([place]) 04/05/2014   AAA (abdominal aortic aneurysm) without rupture ([place]) 04/05/2014   Abdominal aortic aneurysm without rupture ([facility])    Acute non-ST elevation myocardial infarction (NSTEMI) ([place]) 08/15/2014   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/28/2014   APS (antiphospholipid syndrome) ([place])    Arteriosclerosis of coronary artery 04/05/2014   Overview:  Sp cabg with lima to lad svg to d1 om1 rca 2004 with occluded svg to rca and d1 and pci stetn of om1 graft 2015    Arthritis    B12 deficiency 03/21/2017   Barrett's esophagus    Benign essential HTN 05/02/2014   BPH (benign prostatic hyperplasia)    CAD (coronary artery disease)    CHF (congestive heart failure) (HCC)    Chronic systolic heart failure ([place]) 04/19/2014   Overview:  With segmental LV systolic dysfunction ejection fraction of 35%    CLL (chronic lymphocytic leukemia) (HCC)    CLL (chronic lymphocytic leukemia) ([place]) 05/24/2017   DVT (deep venous thrombosis) (HCC)    Dysrhythmia    ATRIAL FIB.   GERD (gastroesophageal reflux disease)    History of hiatal hernia    History of shingles 2013   Hyperlipemia    Hyperplastic polyps of stomach    Hypertension    Myocardial infarction ([place]) 07/30/2014   NSTEMI (non-ST elevated myocardial infarction) (HCC)    OSA (obstructive sleep apnea)    does not use CPAP   Osteoarthritis    Pneumonia    Pre-diabetes    PVD (peripheral vascular disease) (HCC)    RA (rheumatoid arthritis) (HCC)    SOB (shortness of breath)     Review of systems:   Otherwise negative.    Physical Exam  Gen: Alert, oriented. Appears stated age.  HEENT: [place]/AT. PERRLA. Lungs: CTA, no wheezes. CV: RR nl S1, S2. Abd: soft, benign, no masses. BS+ Ext: No edema. Pulses 2+    Planned procedures: Proceed with colonoscopy. The patient understands the nature of the planned procedure, indications, risks, alternatives and potential complications including but not limited to bleeding, infection, perforation, damage to internal organs and possible oversedation/side effects from anesthesia. The patient agrees and gives consent to proceed.  Please refer to procedure notes for findings, recommendations and patient disposition/instructions.     Martin Adkins K. Alice Reichert, M.D. Gastroenterology 07/24/2020  1:46 PM

## 2020-07-24 NOTE — Op Note (Signed)
[facility] Gastroenterology Patient Name: Martin Adkins Procedure Date: 07/24/2020 12:24 PM MRN: [id number] Account #: [id number] Date of Birth: Dec 13, 1945 Admit Type: Outpatient Age: 75 Room: [facility] ENDO ROOM 2 Gender: Male Note Status: Finalized Procedure:             Colonoscopy Indications:           Screening for colorectal malignant neoplasm Providers:             Benay Pike. Alice Reichert MD, MD Referring MD:          [name], MD (Referring MD) Medicines:             Propofol per Anesthesia Complications:         No immediate complications. Procedure:             Pre-Anesthesia Assessment:                        - The risks and benefits of the procedure and the                         sedation options and risks were discussed with the                         patient. All questions were answered and informed                         consent was obtained.                        - Patient identification and proposed procedure were                         verified prior to the procedure by the nurse. The                         procedure was verified in the procedure room.                        - ASA Grade Assessment: III - A patient with severe                         systemic disease.                        - After reviewing the risks and benefits, the patient                         was deemed in satisfactory condition to undergo the                         procedure.                        After obtaining informed consent, the colonoscope was                         passed under direct vision. Throughout the procedure,                         the patient's blood pressure,  pulse, and oxygen                         saturations were monitored continuously. The                         Colonoscope was introduced through the anus and                         advanced to the the cecum, identified by appendiceal                         orifice and ileocecal  valve. The colonoscopy was                         performed without difficulty. The patient tolerated                         the procedure well. The quality of the bowel                         preparation was adequate. The ileocecal valve,                         appendiceal orifice, and rectum were photographed. Findings:      The perianal and digital rectal examinations were normal. Pertinent       negatives include normal sphincter tone and no palpable rectal lesions.      Non-bleeding internal hemorrhoids were found during retroflexion. The       hemorrhoids were Grade I (internal hemorrhoids that do not prolapse).      Many small-mouthed diverticula were found in the sigmoid colon.      Two sessile polyps were found in the descending colon. The polyps were 5       to 7 mm in size. These polyps were removed with a hot snare. Resection       and retrieval were complete.      A 3 mm polyp was found in the descending colon. The polyp was sessile.       The polyp was removed with a jumbo cold forceps. Resection and retrieval       were complete.      A 6 mm polyp was found in the proximal ascending colon. The polyp was       sessile. The polyp was removed with a hot snare. Resection and retrieval       were complete.      A 8 mm polyp was found in the proximal ascending colon. The polyp was       removed with a hot snare. Resection and retrieval were complete. To       prevent bleeding after the polypectomy, two hemostatic clips were       successfully placed (MR conditional). There was no bleeding during, or       at the end, of the procedure.      Two sessile polyps were found in the transverse colon. The polyps were 3       to 4 mm in size. These polyps were removed with a jumbo cold forceps.       Resection and retrieval were complete.      A 8 mm polyp was found  in the transverse colon. The polyp was sessile.       The polyp was removed with a hot snare. Resection and retrieval  were       complete.      A 5 mm polyp was found in the sigmoid colon. The polyp was sessile. The       polyp was removed with a jumbo cold forceps. Resection and retrieval       were complete.      Five sessile polyps were found in the rectum. The polyps were 2 to 4 mm       in size. These polyps were removed with a jumbo cold forceps. Resection       and retrieval were complete.      The exam was otherwise without abnormality. Impression:            - Non-bleeding internal hemorrhoids.                        - Diverticulosis in the sigmoid colon.                        - Two 5 to 7 mm polyps in the descending colon,                         removed with a hot snare. Resected and retrieved.                        - One 3 mm polyp in the descending colon, removed with                         a jumbo cold forceps. Resected and retrieved.                        - One 6 mm polyp in the proximal ascending colon,                         removed with a hot snare. Resected and retrieved.                        - One 8 mm polyp in the proximal ascending colon,                         removed with a hot snare. Resected and retrieved.                         Clips (MR conditional) were placed.                        - Two 3 to 4 mm polyps in the transverse colon,                         removed with a jumbo cold forceps. Resected and                         retrieved.                        - One 8 mm polyp in the transverse colon, removed with  a hot snare. Resected and retrieved.                        - One 5 mm polyp in the sigmoid colon, removed with a                         jumbo cold forceps. Resected and retrieved.                        - Five 2 to 4 mm polyps in the rectum, removed with a                         jumbo cold forceps. Resected and retrieved.                        - The examination was otherwise normal. Recommendation:        - Patient has a contact  number available for                         emergencies. The signs and symptoms of potential                         delayed complications were discussed with the patient.                         Return to normal activities tomorrow. Written                         discharge instructions were provided to the patient.                        - Resume previous diet.                        - Continue present medications.                        - Repeat colonoscopy is recommended for surveillance.                         The colonoscopy date will be determined after                         pathology results from today's exam become available                         for review.                        - Return to GI office PRN.                        - The findings and recommendations were discussed with                         the patient. Procedure Code(s):     --- Professional ---                        [phone], Colonoscopy, flexible; with  removal of                         tumor(s), polyp(s), or other lesion(s) by snare                         technique                        45380, 59, Colonoscopy, flexible; with biopsy, single                         or multiple Diagnosis Code(s):     --- Professional ---                        K57.30, Diverticulosis of large intestine without                         perforation or abscess without bleeding                        K63.5, Polyp of colon                        K62.1, Rectal polyp                        K64.0, First degree hemorrhoids                        Z12.11, Encounter for screening for malignant neoplasm                         of colon CPT copyright 2019 [employer]. All rights reserved. The codes documented in this report are preliminary and upon coder review may  be revised to meet current compliance requirements. Efrain Sella MD, MD 07/24/2020 2:28:42 PM This report has been signed electronically. Number of  Addenda: 0 Note Initiated On: 07/24/2020 12:24 PM Scope Withdrawal Time: 0 hours 19 minutes 21 seconds  Total Procedure Duration: 0 hours 25 minutes 47 seconds  Estimated Blood Loss:  Estimated blood loss: none.      [facility]

## 2020-07-24 NOTE — Interval H&P Note (Signed)
History and Physical Interval Note:  07/24/2020 1:47 PM  Martin Adkins  has presented today for surgery, with the diagnosis of COLON CANCER SCREENING.  The various methods of treatment have been discussed with the patient and family. After consideration of risks, benefits and other options for treatment, the patient has consented to  Procedure(s): COLONOSCOPY WITH PROPOFOL (N/A) as a surgical intervention.  The patient's history has been reviewed, patient examined, no change in status, stable for surgery.  I have reviewed the patient's chart and labs.  Questions were answered to the patient's satisfaction.     [place], [place]

## 2020-07-25 ENCOUNTER — Encounter: Payer: Self-pay | Admitting: Internal Medicine

## 2020-07-27 LAB — SURGICAL PATHOLOGY

## 2020-07-31 ENCOUNTER — Other Ambulatory Visit: Payer: Self-pay

## 2020-07-31 ENCOUNTER — Telehealth: Payer: Self-pay

## 2020-07-31 DIAGNOSIS — C20 Malignant neoplasm of rectum: Secondary | ICD-10-CM

## 2020-07-31 NOTE — Telephone Encounter (Signed)
-----   Message from Earlie Server, MD sent at 07/29/2020  4:23 PM EDT ----- Regarding: RE: [facility] Please arrange patient to do Pelvic MRI rectal protocol - I think it is wo contrast - next available is fine.  Reason is rectal neuroendocrine cancer  And please move his appt up to be a few days after MRI is done.    ----- Message ----- From: Secundino Ginger Sent: 07/28/2020  11:07 AM EDT To: Evelina Dun, RN, Wallene Dales, # Subject: REFERRAL - [facility]                         This referral was sent, but he is already established with DR YU. I put referral in chart. Please advise if he needs a sooner appt.

## 2020-07-31 NOTE — Telephone Encounter (Signed)
Please schedule MRI Pelvis and ajust appts as requested by MD. Notify pt of appts

## 2020-08-01 ENCOUNTER — Encounter: Payer: Self-pay | Admitting: Oncology

## 2020-08-01 NOTE — Telephone Encounter (Signed)
Ladies, [place] said yesterday she was working on this, but I dont see it scheduled. Will you guys please schedule MRI- next availabe and move up future appts to be a couple days after MRI. Please notify pt of appt.

## 2020-08-01 NOTE — Telephone Encounter (Signed)
Patient scheduled for MRI on 8/8 at 945, and moved his lab/md appointments up to 8/16.  Patient notified and confirmed.  Appointment reminder mailed.

## 2020-08-14 ENCOUNTER — Other Ambulatory Visit: Payer: Self-pay

## 2020-08-14 ENCOUNTER — Ambulatory Visit
Admission: RE | Admit: 2020-08-14 | Discharge: 2020-08-14 | Disposition: A | Discharge status: Home / Self Care | Payer: Medicare PPO | Source: Ambulatory Visit | Attending: Oncology | Admitting: Oncology

## 2020-08-14 DIAGNOSIS — C20 Malignant neoplasm of rectum: Secondary | ICD-10-CM | POA: Insufficient documentation

## 2020-08-17 ENCOUNTER — Other Ambulatory Visit: Payer: Self-pay

## 2020-08-17 DIAGNOSIS — C20 Malignant neoplasm of rectum: Secondary | ICD-10-CM

## 2020-08-17 DIAGNOSIS — C9110 Chronic lymphocytic leukemia of B-cell type not having achieved remission: Secondary | ICD-10-CM

## 2020-08-22 ENCOUNTER — Inpatient Hospital Stay (HOSPITAL_BASED_OUTPATIENT_CLINIC_OR_DEPARTMENT_OTHER): Payer: Medicare PPO | Admitting: Oncology

## 2020-08-22 ENCOUNTER — Inpatient Hospital Stay: Payer: Medicare PPO | Attending: Oncology

## 2020-08-22 ENCOUNTER — Encounter: Payer: Self-pay | Admitting: Oncology

## 2020-08-22 VITALS — BP 102/73 | HR 72 | Temp 99.0°F | Resp 16 | Ht 72.0 in | Wt 206.0 lb

## 2020-08-22 DIAGNOSIS — I110 Hypertensive heart disease with heart failure: Secondary | ICD-10-CM | POA: Insufficient documentation

## 2020-08-22 DIAGNOSIS — I2510 Atherosclerotic heart disease of native coronary artery without angina pectoris: Secondary | ICD-10-CM | POA: Insufficient documentation

## 2020-08-22 DIAGNOSIS — K760 Fatty (change of) liver, not elsewhere classified: Secondary | ICD-10-CM | POA: Diagnosis not present

## 2020-08-22 DIAGNOSIS — Z7901 Long term (current) use of anticoagulants: Secondary | ICD-10-CM | POA: Diagnosis not present

## 2020-08-22 DIAGNOSIS — I252 Old myocardial infarction: Secondary | ICD-10-CM | POA: Insufficient documentation

## 2020-08-22 DIAGNOSIS — C9110 Chronic lymphocytic leukemia of B-cell type not having achieved remission: Secondary | ICD-10-CM

## 2020-08-22 DIAGNOSIS — R634 Abnormal weight loss: Secondary | ICD-10-CM | POA: Diagnosis not present

## 2020-08-22 DIAGNOSIS — R161 Splenomegaly, not elsewhere classified: Secondary | ICD-10-CM | POA: Insufficient documentation

## 2020-08-22 DIAGNOSIS — D696 Thrombocytopenia, unspecified: Secondary | ICD-10-CM | POA: Insufficient documentation

## 2020-08-22 DIAGNOSIS — E785 Hyperlipidemia, unspecified: Secondary | ICD-10-CM | POA: Insufficient documentation

## 2020-08-22 DIAGNOSIS — M069 Rheumatoid arthritis, unspecified: Secondary | ICD-10-CM | POA: Diagnosis not present

## 2020-08-22 DIAGNOSIS — G4733 Obstructive sleep apnea (adult) (pediatric): Secondary | ICD-10-CM | POA: Insufficient documentation

## 2020-08-22 DIAGNOSIS — G629 Polyneuropathy, unspecified: Secondary | ICD-10-CM | POA: Insufficient documentation

## 2020-08-22 DIAGNOSIS — Z9049 Acquired absence of other specified parts of digestive tract: Secondary | ICD-10-CM | POA: Insufficient documentation

## 2020-08-22 DIAGNOSIS — I739 Peripheral vascular disease, unspecified: Secondary | ICD-10-CM | POA: Diagnosis not present

## 2020-08-22 DIAGNOSIS — Z86718 Personal history of other venous thrombosis and embolism: Secondary | ICD-10-CM | POA: Diagnosis not present

## 2020-08-22 DIAGNOSIS — I10 Essential (primary) hypertension: Secondary | ICD-10-CM | POA: Insufficient documentation

## 2020-08-22 DIAGNOSIS — I4891 Unspecified atrial fibrillation: Secondary | ICD-10-CM | POA: Diagnosis not present

## 2020-08-22 DIAGNOSIS — Z7902 Long term (current) use of antithrombotics/antiplatelets: Secondary | ICD-10-CM | POA: Diagnosis not present

## 2020-08-22 DIAGNOSIS — Z79899 Other long term (current) drug therapy: Secondary | ICD-10-CM | POA: Insufficient documentation

## 2020-08-22 DIAGNOSIS — C7A8 Other malignant neuroendocrine tumors: Secondary | ICD-10-CM | POA: Diagnosis not present

## 2020-08-22 DIAGNOSIS — I509 Heart failure, unspecified: Secondary | ICD-10-CM | POA: Diagnosis not present

## 2020-08-22 DIAGNOSIS — I714 Abdominal aortic aneurysm, without rupture: Secondary | ICD-10-CM | POA: Insufficient documentation

## 2020-08-22 DIAGNOSIS — C20 Malignant neoplasm of rectum: Secondary | ICD-10-CM

## 2020-08-22 DIAGNOSIS — Z7982 Long term (current) use of aspirin: Secondary | ICD-10-CM | POA: Insufficient documentation

## 2020-08-22 DIAGNOSIS — E538 Deficiency of other specified B group vitamins: Secondary | ICD-10-CM | POA: Insufficient documentation

## 2020-08-22 DIAGNOSIS — N400 Enlarged prostate without lower urinary tract symptoms: Secondary | ICD-10-CM | POA: Insufficient documentation

## 2020-08-22 DIAGNOSIS — Z8042 Family history of malignant neoplasm of prostate: Secondary | ICD-10-CM | POA: Insufficient documentation

## 2020-08-22 LAB — CBC WITH DIFFERENTIAL/PLATELET
Abs Immature Granulocytes: 0.01 10*3/uL (ref 0.00–0.07)
Basophils Absolute: 0 10*3/uL (ref 0.0–0.1)
Basophils Relative: 0 %
Eosinophils Absolute: 0.1 10*3/uL (ref 0.0–0.5)
Eosinophils Relative: 2 %
HCT: 43.3 % (ref 39.0–52.0)
Hemoglobin: 14.3 g/dL (ref 13.0–17.0)
Immature Granulocytes: 0 %
Lymphocytes Relative: 42 %
Lymphs Abs: 2.6 10*3/uL (ref 0.7–4.0)
MCH: 29.4 pg (ref 26.0–34.0)
MCHC: 33 g/dL (ref 30.0–36.0)
MCV: 88.9 fL (ref 80.0–100.0)
Monocytes Absolute: 0.7 10*3/uL (ref 0.1–1.0)
Monocytes Relative: 10 %
Neutro Abs: 2.9 10*3/uL (ref 1.7–7.7)
Neutrophils Relative %: 46 %
Platelets: 134 10*3/uL — ABNORMAL LOW (ref 150–400)
RBC: 4.87 MIL/uL (ref 4.22–5.81)
RDW: 15.2 % (ref 11.5–15.5)
Smear Review: NORMAL
WBC: 6.3 10*3/uL (ref 4.0–10.5)
nRBC: 0 % (ref 0.0–0.2)

## 2020-08-22 LAB — COMPREHENSIVE METABOLIC PANEL
ALT: 32 U/L (ref 0–44)
AST: 42 U/L — ABNORMAL HIGH (ref 15–41)
Albumin: 4.2 g/dL (ref 3.5–5.0)
Alkaline Phosphatase: 83 U/L (ref 38–126)
Anion gap: 8 (ref 5–15)
BUN: 14 mg/dL (ref 8–23)
CO2: 25 mmol/L (ref 22–32)
Calcium: 9.2 mg/dL (ref 8.9–10.3)
Chloride: 106 mmol/L (ref 98–111)
Creatinine, Ser: 0.84 mg/dL (ref 0.61–1.24)
GFR, Estimated: >60 mL/min (ref >60)
Glucose, Bld: 122 mg/dL — ABNORMAL HIGH (ref 70–99)
Potassium: 3.6 mmol/L (ref 3.5–5.1)
Sodium: 139 mmol/L (ref 135–145)
Total Bilirubin: 1.7 mg/dL — ABNORMAL HIGH (ref 0.3–1.2)
Total Protein: 7.1 g/dL (ref 6.5–8.1)

## 2020-08-22 LAB — LACTATE DEHYDROGENASE: LDH: 134 U/L (ref 98–192)

## 2020-08-22 NOTE — Progress Notes (Addendum)
Hematology/Oncology Follow up note [facility] [phone]) [phone] [phone]   Patient Care Team: [name], MD as PCP - General (Internal Medicine) Earlie Server, MD as Medical Oncologist (Medical Oncology)  REFERRING PROVIDER: [name], MD  REASON FOR VISIT Follow up for management of CLL, Rectal NET HISTORY OF PRESENTING ILLNESS:  Martin Adkins is a  75 y.o.  male with PMH listed below who was referred to me for evaluation of lymphocytosis.  Recent lab work on 03/05/2017 showed wbc 11.4, hb 14.6, platelet count 117,000, lymphocytosis 63.5%, neutrophil 22%, Report chronic fatigue, no weight loss, fever or chills.  He reports feeling chest "rattleness". Has for CT chest which showed acute finding, chronic finding includes  postsurgical changes consistent with coronary bypass grafting. One of the bypass grafts arising from the aorta shows apparent thrombosis and an area of distal aneurysmal dilatation which is also Thrombosed. Right adrenal adenoma stable in appearance. Mild scarring in the lung bases.Fatty liver.   Takes Aspirin 47m, coumadine, plavix. He is on anticoagulation for previous history of clots.  Denies any bleeding events. Use to drink hard liquir daily, quitted for 4-5 years. Current drinks wine on weekend.    Image Studies # 04/04/2017  [place] showed 1.  Splenomegaly.  No focal splenic lesions evident. 2. Increased liver echogenicity, a finding most likely indicative of hepatic steatosis. While no focal liver lesions are evident on this study, it must be cautioned that the sensitivity of ultrasound for detection of focal liver lesions is diminished in this circumstance.3. Pancreas obscured by gas. Portions of inferior vena cava obscured by gas.4.  Gallbladder absent.5. Status post abdominal aortic aneurysm repair. No periaortic fluid.  # 04/21/2017 PET scan 1. Moderate splenomegaly with uniform splenic  hypermetabolism,compatible with the provided history of lymphoproliferative disease.No additional hypermetabolic sites of lymphoproliferative disease. Specifically, no hypermetabolic lymphadenopathy Hepatic steatosis, aortic atherosclerosis. Aneurysm.    # CLL, stage IV disease given spelencemagly, thrombocytopenia, symptomatic with fatigue and weight loss.  CLL IPL score 4, high risk.   # status post abdominal aortic aneurysm repair on 09/25/2017 s/p cardiac cath which revealed patent LIMA to the LAD, no significant disease in the RCA with a 90% stenosis in a small RV marginal branch.  Occluded left circumflex, occluded LAD.  Saphenous vein grafts are all occluded.  RCA does not require grafting.  LAD is well grafted by the left internal mammary.  Medical management  INTERVAL HISTORY Martin JVERNEL LANGENDERFERis a 75y.o. male who has above presents for follow-up of CLL, rectal neuroendocrine tumor. He has been off venetoclax since 11/11/2017. Patient denies unintentional weight loss, night sweats, fever.  Appetite is good. 07/24/2020, colonoscopy showed diverticulosis in the sigmoid colon.  Multiple polyps were removed from descending colon, proximal ascending colon, transverse colon, sigmoid colon and 5 small 2-435mpolyps resected from rectum. Polyps from the colon are tubular adenoma, negative for high-grade dysplasia and malignancy. Polyps from rectum showed well differentiated neuroendocrine tumor, grade 1, involving the base of the submitted tissue-1 fragment, hyperplastic polyp 2 fragments. Patient has no new complaints today.  Review of Systems  Constitutional:  Negative for appetite change, chills, fatigue, fever and unexpected weight change.  HENT:   Negative for hearing loss and voice change.   Eyes:  Negative for eye problems and icterus.  Respiratory:  Negative for chest tightness, cough, shortness of breath and wheezing.   Cardiovascular:  Negative for chest pain and leg swelling.   Gastrointestinal:  Negative for abdominal distention and abdominal pain.  Endocrine: Negative for hot flashes.  Genitourinary:  Negative for difficulty urinating, dysuria and frequency.   Musculoskeletal:  Negative for arthralgias and back pain.  Skin:  Negative for itching and rash.  Neurological:  Positive for numbness. Negative for dizziness, headaches and light-headedness.  Hematological:  Negative for adenopathy. Does not bruise/bleed easily.  Psychiatric/Behavioral:  Negative for confusion.     MEDICAL HISTORY:  Past Medical History:  Diagnosis Date   AAA (abdominal aortic aneurysm) without rupture ([place]) 04/05/2014   AAA (abdominal aortic aneurysm) without rupture ([place]) 04/05/2014   Abdominal aortic aneurysm without rupture (HCC)    Acute non-ST elevation myocardial infarction (NSTEMI) ([place]) 08/15/2014   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/28/2014   APS (antiphospholipid syndrome) ([place])    Arteriosclerosis of coronary artery 04/05/2014   Overview:  Sp cabg with lima to lad svg to d1 om1 rca 2004 with occluded svg to rca and d1 and pci stetn of om1 graft 2015    Arthritis    B12 deficiency 03/21/2017   Barrett's esophagus    Benign essential HTN 05/02/2014   BPH (benign prostatic hyperplasia)    CAD (coronary artery disease)    CHF (congestive heart failure) (HCC)    Chronic systolic heart failure ([place]) 04/19/2014   Overview:  With segmental LV systolic dysfunction ejection fraction of 35%    CLL (chronic lymphocytic leukemia) (HCC)    CLL (chronic lymphocytic leukemia) ([place]) 05/24/2017   DVT (deep venous thrombosis) (HCC)    Dysrhythmia    ATRIAL FIB.   GERD (gastroesophageal reflux disease)    History of hiatal hernia    History of shingles 2013   Hyperlipemia    Hyperplastic polyps of stomach    Hypertension    Myocardial infarction ([place]) 07/30/2014   NSTEMI (non-ST elevated myocardial infarction) (HCC)    OSA (obstructive sleep apnea)    does not use CPAP   Osteoarthritis     Pneumonia    Pre-diabetes    PVD (peripheral vascular disease) (HCC)    RA (rheumatoid arthritis) (HCC)    SOB (shortness of breath)     SURGICAL HISTORY: Past Surgical History:  Procedure Laterality Date   ABDOMINAL AORTA STENT     CHOLECYSTECTOMY     COLONOSCOPY  11/24/2009   COLONOSCOPY WITH PROPOFOL N/A 07/24/2020   Procedure: COLONOSCOPY WITH PROPOFOL;  Surgeon: Toledo, Benay Pike, MD;  Location: [facility] ENDOSCOPY;  Service: Gastroenterology;  Laterality: N/A;   CORONARY ANGIOPLASTY WITH STENT PLACEMENT  2015   CORONARY ARTERY BYPASS GRAFT     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   Procedure: DUPUYTREN CONTRACTURE RELEASE LEFT LONG FINGER;  Surgeon: Corky Mull, MD;  Location: [facility] ORS;  Service: Orthopedics;  Laterality: Left;   ENDOVASCULAR REPAIR/STENT GRAFT N/A 09/24/2017   Procedure: ENDOVASCULAR REPAIR/STENT GRAFT;  Surgeon: Algernon Huxley, MD;  Location: [place] [facility];  Service: Cardiovascular;  Laterality: N/A;   ESOPHAGOGASTRODUODENOSCOPY  05/26/02  03/23/12   ESOPHAGOGASTRODUODENOSCOPY (EGD) WITH PROPOFOL N/A 10/28/2016   Procedure: ESOPHAGOGASTRODUODENOSCOPY (EGD) WITH PROPOFOL;  Surgeon: Manya Silvas, MD;  Location: [facility] ENDOSCOPY;  Service: Endoscopy;  Laterality: N/A;   HERNIA REPAIR Left    inguinal   INCISION AND DRAINAGE ABSCESS N/A 06/14/2020   Procedure: INCISION AND DRAINAGE ABSCESS;  Surgeon: Herbert Pun, MD;  Location: [facility] ORS;  Service: General;  Laterality: N/A;   LEFT HEART CATH AND CORONARY ANGIOGRAPHY N/A 01/19/2018   Procedure: LEFT HEART CATH AND CORONARY ANGIOGRAPHY;  Surgeon: Teodoro Spray, MD;  Location: [place] [facility];  Service: Cardiovascular;  Laterality: N/A;   PERIPHERAL VASCULAR CATHETERIZATION N/A 09/06/2014   Procedure: IVC Filter Removal;  Surgeon: Katha Cabal, MD;  Location: [place] [facility];  Service: Cardiovascular;  Laterality: N/A;   TRANSURETHRAL RESECTION OF PROSTATE N/A 02/20/2015    Procedure: TRANSURETHRAL RESECTION OF THE PROSTATE (TURP);  Surgeon: Hollice Espy, MD;  Location: [facility] ORS;  Service: Urology;  Laterality: N/A;    SOCIAL HISTORY: Social History   Socioeconomic History   Marital status: Legally Separated    Spouse name: Not on file   Number of children: Not on file   Years of education: Not on file   Highest education level: Not on file  Occupational History   Not on file  Tobacco Use   Smoking status: Some Days    Packs/day: 0.50    Years: 20.00    Pack years: 10.00    Types: Cigarettes   Smokeless tobacco: Never   Tobacco comments:    sometimes 2-3 a day  Vaping Use   Vaping Use: Never used  Substance and Sexual Activity   Alcohol use: Yes    Alcohol/week: 1.0 - 2.0 standard drink    Types: 1 - 2 Shots of liquor per week   Drug use: No   Sexual activity: Never  Other Topics Concern   Not on file  Social History Narrative   Not on file   Social Determinants of Health   Financial Resource Strain: Not on file  Food Insecurity: Not on file  Transportation Needs: Not on file  Physical Activity: Not on file  Stress: Not on file  Social Connections: Not on file  Intimate Partner Violence: Not on file    FAMILY HISTORY: Family History  Problem Relation Age of Onset   Cancer Mother 44       breast ca   Diabetes Mother    Coronary artery disease Father    Heart attack Father    Prostate cancer Brother    Bladder Cancer Neg Hx    Kidney cancer Neg Hx     ALLERGIES:  is allergic to ace inhibitors, amoxicillin, niacin, penicillins, pravastatin, rosuvastatin, and simvastatin.  MEDICATIONS:  Current Outpatient Medications  Medication Sig Dispense Refill   albuterol (PROVENTIL HFA;VENTOLIN HFA) 108 (90 Base) MCG/ACT inhaler Inhale 2 puffs into the lungs every 6 (six) hours as needed for wheezing or shortness of breath. 1 Inhaler 0   amLODipine (NORVASC) 10 MG tablet Take 10 mg by mouth daily.      atorvastatin (LIPITOR) 80 MG  tablet Take 80 mg by mouth daily.   11   baclofen (LIORESAL) 10 MG tablet      dorzolamide (TRUSOPT) 2 % ophthalmic solution Place 1 drop into both eyes 2 (two) times daily.      fluticasone (FLONASE) 50 MCG/ACT nasal spray Place 2 sprays into both nostrils daily.      gabapentin (NEURONTIN) 300 MG capsule Take 1 capsule (300 mg total) by mouth 3 (three) times daily. (Patient taking differently: Take 300 mg by mouth 2 (two) times daily. 2 tabs BID) 90 capsule 0   HYDROcodone-acetaminophen (NORCO) 5-325 MG tablet Take 1-2 tablets by mouth every 6 (six) hours as needed for moderate pain. MAXIMUM TOTAL ACETAMINOPHEN DOSE IS 4000 MG PER DAY 30 tablet 0   Ipratropium-Albuterol (COMBIVENT) 20-100 MCG/ACT AERS respimat Inhale 1 puff into the lungs every 6 (six) hours. 1 Inhaler 11  isosorbide mononitrate (IMDUR) 60 MG 24 hr tablet Take 1 tablet (60 mg total) by mouth daily. 30 tablet 0   latanoprost (XALATAN) 0.005 % ophthalmic solution Place 1 drop into both eyes at bedtime.      losartan (COZAAR) 25 MG tablet Take 25 mg by mouth daily.     meclizine (ANTIVERT) 12.5 MG tablet Take by mouth.     metoprolol succinate (TOPROL-XL) 100 MG 24 hr tablet Take 100 mg by mouth daily.      Multiple Vitamins-Minerals (MULTIVITAMIN MEN 50+ PO) Take 1 tablet by mouth daily.     pantoprazole (PROTONIX) 40 MG tablet Take 40 mg by mouth daily.      potassium chloride SA (K-DUR,KLOR-CON) 20 MEQ tablet Take 20 mEq by mouth daily.      rivaroxaban (XARELTO) 20 MG TABS tablet Take 1 tablet (20 mg total) by mouth daily with supper. 30 tablet 3   VITAMIN D, CHOLECALCIFEROL, PO Take 1,000 Units/day by mouth daily.     No current facility-administered medications for this visit.     PHYSICAL EXAMINATION: ECOG 1 Vitals:   08/22/20 1402  BP: 102/73  Pulse: 72  Resp: 16  Temp: 99 F (37.2 C)  SpO2: 99%  Physical Exam Vitals and nursing note reviewed.  Constitutional:      General: He is not in acute distress.     Appearance: He is not diaphoretic.  HENT:     Head: Normocephalic and atraumatic.     Nose: Nose normal.     Mouth/Throat:     Pharynx: No oropharyngeal exudate.  Eyes:     General: No scleral icterus.       Left eye: No discharge.     Pupils: Pupils are equal, round, and reactive to light.  Neck:     Vascular: No JVD.  Cardiovascular:     Rate and Rhythm: Normal rate and regular rhythm.     Heart sounds: Normal heart sounds. No murmur heard. Pulmonary:     Effort: Pulmonary effort is normal. No respiratory distress.     Breath sounds: No wheezing or rales.  Chest:     Chest wall: No tenderness.  Abdominal:     General: There is no distension.     Palpations: Abdomen is soft.     Tenderness: There is no abdominal tenderness.  Musculoskeletal:        General: No deformity. Normal range of motion.     Cervical back: Normal range of motion and neck supple.  Lymphadenopathy:     Cervical: No cervical adenopathy.  Skin:    General: Skin is warm and dry.     Findings: No erythema.  Neurological:     Mental Status: He is alert and oriented to person, place, and time.     Cranial Nerves: No cranial nerve deficit.     Motor: No abnormal muscle tone.     Coordination: Coordination normal.  Psychiatric:        Mood and Affect: Affect normal.     LABORATORY DATA:  I have reviewed the data as listed Lab Results  Component Value Date   WBC 6.3 08/22/2020   HGB 14.3 08/22/2020   HCT 43.3 08/22/2020   MCV 88.9 08/22/2020   PLT 134 (L) 08/22/2020   Recent Labs    10/07/19 0924 04/03/20 0951 08/22/20 1343  NA 140 140 139  K 3.8 3.7 3.6  CL 103 104 106  CO2 '27 25 25  ' GLUCOSE 136* 132*  122*  BUN '13 18 14  ' CREATININE 0.94 0.76 0.84  CALCIUM 9.3 8.9 9.2  GFRNONAA >60 >60 >60  GFRAA >60  --   --   PROT 7.6 7.1 7.1  ALBUMIN 4.5 4.2 4.2  AST 40 46* 42*  ALT 37 45* 32  ALKPHOS 91 77 83  BILITOT 1.5* 1.6* 1.7*    Hepatitis panel negative.  LDH 228, mildly  elevated Beta 2 microglobulin normal.   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 The morphologic and immunophenotypic features are most consistent with chronic lymphocytic leukemia/small lymphocytic lymphoma. Bone marrow Cytogenetics : normal.  Peripheral blood FISH panel negative for CCND1- IGH mutation, ATM, 12, 13q, Tp53 mutation.   RADIOGRAPHIC STUDIES: I have personally reviewed the radiological images as listed and agreed with the findings in the report. CT angio abd/pelvis showed Type 1b endoleak, AAA aneurysm, Spleen has decreased in size since PET scan in April 2019.   ASSESSMENT & PLAN:   1. 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 CLL (chronic lymphocytic leukemia) ([place])   3. Long term current use of anticoagulant therapy   4. 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  Patient has been in remission.  Off treatment since November 2019. 10/07/19/2020 Peripheral blood flow cytometry negative for immunophenotypic abnormality.  Labs are reviewed and discussed with patient Counts are stable. No constitutional symptoms. Continue observation Repeat flow cytometry is pending at time of dictation.   # Neuropathy, follows up with neurology. He takes gabapentin. #Atrial fibrillation, on chronic anticoagulation with Xarelto.  Follow-up with cardiology. #Thrombocytopenia, stable.  134,000 #Stage I T1N0 rectum neuroendocrine tumor MRI pelvis without contrast showed no evidence of rectal primary or polyps identified.  I clarified with radiology Dr. Jobe Igo.  An addendum was added that the T staging [ post resection] should be T0, no muscularis propia invasion.   Given the fact that the neuroendocrine tumor involves base of the  polyp-positive margin, will reach out to GI to see if he will benefit from a second look to ensure complete resection, via EMR.   Repeat   6 months, lab cbc cmp flowcytometry, LDH  Earlie Server, MD, PhD Hematology Oncology [facility] Pager- [phone] 08/22/2020

## 2020-08-23 ENCOUNTER — Encounter: Payer: Self-pay | Admitting: Oncology

## 2020-08-24 LAB — COMP PANEL: LEUKEMIA/LYMPHOMA: Immunophenotypic Profile: 27

## 2020-08-25 ENCOUNTER — Telehealth: Payer: Self-pay

## 2020-08-25 ENCOUNTER — Other Ambulatory Visit: Payer: Self-pay

## 2020-08-25 DIAGNOSIS — C7A8 Other malignant neuroendocrine tumors: Secondary | ICD-10-CM

## 2020-08-25 NOTE — Telephone Encounter (Signed)
Please cancel CT on 8/30.   Follow up: labs in 6 months (cbc,cmp, flow cyt, ldh) MD 3 days after labs.   Please notify pt of appts.

## 2020-08-25 NOTE — Telephone Encounter (Signed)
-----   Message from Earlie Server, MD sent at 08/24/2020  5:02 PM EDT ----- I talked to radiologist and he changed the T<=2 to T0  The initial endoscopy description of the polyps are small, <1cm.  So no need to do CT abdomen. He has had MRI rectal, which is adequate.  Benjamine Mola, please cancel his CT abdomen.  Follow up plan is  lab cbc cmp, flowcytometry ldh in 6 months with me.   Baron Sane, please schedule patient to see Dr.Mansouraty for possible EMR. ( I doubt he will see any, but better to have a second look, and EMR if there is residual disease].   I have called patient and explained the changes. He is aware about above plan.   ----- Message ----- From: Clent Jacks, RN Sent: 08/24/2020   9:32 AM EDT To: Earlie Server, MD  They will not perform it here. Dr. Rush Landmark in [place] also does EMR.   ----- Message ----- From: Earlie Server, MD Sent: 08/23/2020   4:52 PM EDT To: Clent Jacks, RN  Patient has rectal NET, involving the base of polyp. Recommend EUS resection. I am wondering if Dr.Spaete or Dr.Jowell are able to do that for him? Thanks.

## 2020-08-25 NOTE — Telephone Encounter (Signed)
-----   Message from Irving Copas., MD sent at 08/25/2020 12:26 PM EDT ----- Regarding: RE: referral [name], Please move forward with EUS/EMR Lower 75 minute case with me next available. Let Dr. Tasia Catchings and I know when scheduled. Thanks. GM ----- Message ----- From: Clent Jacks, RN Sent: 08/25/2020  11:11 AM EDT To: Timothy Lasso, RN, Irving Copas., MD Subject: referral                                       Hi Dr. Rush Landmark and Chong Sicilian, I have sent you a referral for this NET patient. Thanks. Kristi ----- Message ----- From: Earlie Server, MD Sent: 08/24/2020   5:05 PM EDT To: Evelina Dun, RN, Clent Jacks, RN  I talked to radiologist and he changed the T<=2 to T0  The initial endoscopy description of the polyps are small, <1cm.  So no need to do CT abdomen. He has had MRI rectal, which is adequate.  Benjamine Mola, please cancel his CT abdomen.  Follow up plan is  lab cbc cmp, flowcytometry ldh in 6 months with me.   Baron Sane, please schedule patient to see Dr.Mansouraty for possible EMR. ( I doubt he will see any, but better to have a second look, and EMR if there is residual disease].   I have called patient and explained the changes. He is aware about above plan.   ----- Message ----- From: Clent Jacks, RN Sent: 08/24/2020   9:32 AM EDT To: Earlie Server, MD  They will not perform it here. Dr. Rush Landmark in [place] also does EMR.   ----- Message ----- From: Earlie Server, MD Sent: 08/23/2020   4:52 PM EDT To: Clent Jacks, RN  Patient has rectal NET, involving the base of polyp. Recommend EUS resection. I am wondering if Dr.Spaete or Dr.Jowell are able to do that for him? Thanks.

## 2020-08-28 ENCOUNTER — Other Ambulatory Visit: Payer: Self-pay

## 2020-08-28 DIAGNOSIS — D131 Benign neoplasm of stomach: Secondary | ICD-10-CM

## 2020-08-28 DIAGNOSIS — D128 Benign neoplasm of rectum: Secondary | ICD-10-CM

## 2020-08-28 DIAGNOSIS — K317 Polyp of stomach and duodenum: Secondary | ICD-10-CM

## 2020-08-28 NOTE — Telephone Encounter (Signed)
The pt has been scheduled for lower EUS EMR 75 min case with GM at [facility] on 10/09/20 at 845 am.    Orders entered

## 2020-08-28 NOTE — Telephone Encounter (Signed)
Dr Tasia Catchings can the pt hold xarelto for 48 hours prior to the procedure?

## 2020-08-30 NOTE — Telephone Encounter (Signed)
Dr Nehemiah Massed can you please review for Xarelto hold?

## 2020-09-05 ENCOUNTER — Ambulatory Visit: Payer: Medicare PPO

## 2020-09-19 ENCOUNTER — Other Ambulatory Visit: Payer: Medicare PPO

## 2020-09-19 ENCOUNTER — Ambulatory Visit: Payer: Medicare PPO | Admitting: Oncology

## 2020-09-25 ENCOUNTER — Other Ambulatory Visit (INDEPENDENT_AMBULATORY_CARE_PROVIDER_SITE_OTHER): Payer: Self-pay | Admitting: Nurse Practitioner

## 2020-09-25 DIAGNOSIS — Z8679 Personal history of other diseases of the circulatory system: Secondary | ICD-10-CM

## 2020-09-25 DIAGNOSIS — Z9889 Other specified postprocedural states: Secondary | ICD-10-CM

## 2020-09-25 DIAGNOSIS — I739 Peripheral vascular disease, unspecified: Secondary | ICD-10-CM

## 2020-09-26 ENCOUNTER — Other Ambulatory Visit: Payer: Self-pay

## 2020-09-26 ENCOUNTER — Encounter (INDEPENDENT_AMBULATORY_CARE_PROVIDER_SITE_OTHER): Payer: Self-pay | Admitting: Vascular Surgery

## 2020-09-26 ENCOUNTER — Ambulatory Visit (INDEPENDENT_AMBULATORY_CARE_PROVIDER_SITE_OTHER): Payer: Medicare PPO | Admitting: Vascular Surgery

## 2020-09-26 ENCOUNTER — Ambulatory Visit (INDEPENDENT_AMBULATORY_CARE_PROVIDER_SITE_OTHER): Payer: Medicare PPO

## 2020-09-26 VITALS — BP 150/88 | HR 65 | Resp 16 | Wt 206.0 lb

## 2020-09-26 DIAGNOSIS — I6529 Occlusion and stenosis of unspecified carotid artery: Secondary | ICD-10-CM

## 2020-09-26 DIAGNOSIS — I10 Essential (primary) hypertension: Secondary | ICD-10-CM | POA: Diagnosis not present

## 2020-09-26 DIAGNOSIS — Z9889 Other specified postprocedural states: Secondary | ICD-10-CM

## 2020-09-26 DIAGNOSIS — I714 Abdominal aortic aneurysm, without rupture, unspecified: Secondary | ICD-10-CM

## 2020-09-26 DIAGNOSIS — E1151 Type 2 diabetes mellitus with diabetic peripheral angiopathy without gangrene: Secondary | ICD-10-CM

## 2020-09-26 DIAGNOSIS — I739 Peripheral vascular disease, unspecified: Secondary | ICD-10-CM

## 2020-09-26 DIAGNOSIS — Z8679 Personal history of other diseases of the circulatory system: Secondary | ICD-10-CM | POA: Diagnosis not present

## 2020-09-26 NOTE — Assessment & Plan Note (Signed)
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  I do not see an ultrasound or other study that would give the degree of stenosis and he says it has not been checked in many years.  We will do a carotid duplex with his next follow-up.

## 2020-09-26 NOTE — Progress Notes (Signed)
MRN : [id number]  Martin Adkins is a 75 y.o. (1945-07-05) male who presents with chief complaint of  Chief Complaint  Patient presents with   Follow-up    Ultrasound follow up  .  History of Present Illness: Patient returns today in follow up of multiple vascular issues.  He is several years status post endovascular abdominal aortic aneurysm repair and also has a known history of peripheral arterial disease.  He does get some mild claudication symptoms but much of the symptoms sound like neuropathy in the lower extremities.  No open sores.  No rest pain.  He also denies focal neurologic symptoms. Specifically, the patient denies amaurosis fugax, speech or swallowing difficulties, or arm or leg weakness or numbness.  Non-invasive studies today demonstrate stable 5.3 cm diameter aortic sac size without evidence of endoleak. ABIs today are noncompressible although the waveforms are quite good overall.  The digit pressure is 122 on the right and 80 on the left  Current Outpatient Medications  Medication Sig Dispense Refill   albuterol (PROVENTIL HFA;VENTOLIN HFA) 108 (90 Base) MCG/ACT inhaler Inhale 2 puffs into the lungs every 6 (six) hours as needed for wheezing or shortness of breath. 1 Inhaler 0   amLODipine (NORVASC) 10 MG tablet Take 10 mg by mouth daily.      atorvastatin (LIPITOR) 80 MG tablet Take 80 mg by mouth daily.   11   baclofen (LIORESAL) 10 MG tablet      dorzolamide (TRUSOPT) 2 % ophthalmic solution Place 1 drop into both eyes 2 (two) times daily.      fluticasone (FLONASE) 50 MCG/ACT nasal spray Place 2 sprays into both nostrils daily.      gabapentin (NEURONTIN) 300 MG capsule Take 1 capsule (300 mg total) by mouth 3 (three) times daily. (Patient taking differently: Take 300 mg by mouth 2 (two) times daily. 2 tabs BID) 90 capsule 0   HYDROcodone-acetaminophen (NORCO) 5-325 MG tablet Take 1-2 tablets by mouth every 6 (six) hours as needed for moderate pain. MAXIMUM TOTAL  ACETAMINOPHEN DOSE IS 4000 MG PER DAY 30 tablet 0   Ipratropium-Albuterol (COMBIVENT) 20-100 MCG/ACT AERS respimat Inhale 1 puff into the lungs every 6 (six) hours. 1 Inhaler 11   isosorbide mononitrate (IMDUR) 60 MG 24 hr tablet Take 1 tablet (60 mg total) by mouth daily. 30 tablet 0   latanoprost (XALATAN) 0.005 % ophthalmic solution Place 1 drop into both eyes at bedtime.      losartan (COZAAR) 25 MG tablet Take 25 mg by mouth daily.     metoprolol succinate (TOPROL-XL) 100 MG 24 hr tablet Take 100 mg by mouth daily.      Multiple Vitamins-Minerals (MULTIVITAMIN MEN 50+ PO) Take 1 tablet by mouth daily.     pantoprazole (PROTONIX) 40 MG tablet Take 40 mg by mouth daily.      potassium chloride SA (K-DUR,KLOR-CON) 20 MEQ tablet Take 20 mEq by mouth daily.      rivaroxaban (XARELTO) 20 MG TABS tablet Take 1 tablet (20 mg total) by mouth daily with supper. 30 tablet 3   VITAMIN D, CHOLECALCIFEROL, PO Take 1,000 Units/day by mouth daily.     No current facility-administered medications for this visit.    Past Medical History:  Diagnosis Date   AAA (abdominal aortic aneurysm) without rupture ([place]) 04/05/2014   AAA (abdominal aortic aneurysm) without rupture ([place]) 04/05/2014   Abdominal aortic aneurysm without rupture (HCC)    Acute non-ST elevation myocardial infarction (NSTEMI) ([place])  08/15/2014   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/28/2014   APS (antiphospholipid syndrome) ([place])    Arteriosclerosis of coronary artery 04/05/2014   Overview:  Sp cabg with lima to lad svg to d1 om1 rca 2004 with occluded svg to rca and d1 and pci stetn of om1 graft 2015    Arthritis    B12 deficiency 03/21/2017   Barrett's esophagus    Benign essential HTN 05/02/2014   BPH (benign prostatic hyperplasia)    CAD (coronary artery disease)    CHF (congestive heart failure) ([place])    Chronic systolic heart failure ([place]) 04/19/2014   Overview:  With segmental LV systolic dysfunction ejection fraction of 35%    CLL  (chronic lymphocytic leukemia) (HCC)    CLL (chronic lymphocytic leukemia) ([place]) 05/24/2017   DVT (deep venous thrombosis) (HCC)    Dysrhythmia    ATRIAL FIB.   GERD (gastroesophageal reflux disease)    History of hiatal hernia    History of shingles 2013   Hyperlipemia    Hyperplastic polyps of stomach    Hypertension    Myocardial infarction ([place]) 07/30/2014   NSTEMI (non-ST elevated myocardial infarction) (HCC)    OSA (obstructive sleep apnea)    does not use CPAP   Osteoarthritis    Pneumonia    Pre-diabetes    PVD (peripheral vascular disease) (HCC)    RA (rheumatoid arthritis) (HCC)    SOB (shortness of breath)     Past Surgical History:  Procedure Laterality Date   ABDOMINAL AORTA STENT     CHOLECYSTECTOMY     COLONOSCOPY  11/24/2009   COLONOSCOPY WITH PROPOFOL N/A 07/24/2020   Procedure: COLONOSCOPY WITH PROPOFOL;  Surgeon: Toledo, Benay Pike, MD;  Location: [facility] ENDOSCOPY;  Service: Gastroenterology;  Laterality: N/A;   CORONARY ANGIOPLASTY WITH STENT PLACEMENT  2015   CORONARY ARTERY BYPASS GRAFT     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   Procedure: DUPUYTREN CONTRACTURE RELEASE LEFT LONG FINGER;  Surgeon: Corky Mull, MD;  Location: [facility] OR[facility];  Service: Orthopedics;  Laterality: Left;   ENDOVASCULAR REPAIR/STENT GRAFT N/A 09/24/2017   Procedure: ENDOVASCULAR REPAIR/STENT GRAFT;  Surgeon: Algernon Huxley, MD;  Location: [facility];  Service: Cardiovascular;  Laterality: N/A;   ESOPHAGOGASTRODUODENOSCOPY  05/26/02  03/23/12   ESOPHAGOGASTRODUODENOSCOPY (EGD) WITH PROPOFOL N/A 10/28/2016   Procedure: ESOPHAGOGASTRODUODENOSCOPY (EGD) WITH PROPOFOL;  Surgeon: Manya Silvas, MD;  Location[facility]COPY;  Service: Endoscopy;  Laterality: N/A;   HERNIA REPAIR Left    inguinal   INCISION AND DRAINAGE ABSCESS N/A 06/14/2020   Procedure: INCISION AND DRAINAGE ABSCESS;  Surgeon: Herbert Pun, MD;  Location: [facility] ORS;  Service: General;  Laterality: N/A;    LEFT HEART CATH AND CORONARY ANGIOGRAPHY N/A 01/19/2018   Procedure: LEFT HEART CATH AND CORONARY ANGIOGRAPHY;  Surgeon: Teodoro Spray, MD;  Location: [facility];  Service: Cardiovascular;  Laterality: N/A;   PERIPHERAL VASCULAR CATHETERIZATION N/A 09/06/2014   Procedure: IVC Filter Removal;  Surgeon: Katha Cabal, MD;  Location: [facility];  Service: Cardiovascular;  Laterality: N/A;   TRANSURETHRAL RESECTION OF PROSTATE N/A 02/20/2015   Procedure: TRANSURETHRAL RESECTION OF THE PROSTATE (TURP);  Surgeon: Hollice Espy, MD;  Location: [facility] ORS;  Service: Urology;  Laterality: N/A;     Social History   Tobacco Use   Smoking status: Some Days    Packs/day: 0.50    Years: 20.00    Pack years: 10.00    Types: Cigarettes   Smokeless  tobacco: Never   Tobacco comments:    sometimes 2-3 a day  Vaping Use   Vaping Use: Never used  Substance Use Topics   Alcohol use: Yes    Alcohol/week: 1.0 - 2.0 standard drink    Types: 1 - 2 Shots of liquor per week   Drug use: No       Family History  Problem Relation Age of Onset   Cancer Mother 43       breast ca   Diabetes Mother    Coronary artery disease Father    Heart attack Father    Prostate cancer Brother    Bladder Cancer Neg Hx    Kidney cancer Neg Hx      Allergies  Allergen Reactions   Ace Inhibitors Other (See Comments)    Other reaction(s): Cough   Amoxicillin Rash    Has patient had a PCN reaction causing immediate rash, facial/tongue/throat swelling, SOB or lightheadedness with hypotension: Yes Has patient had a PCN reaction causing severe rash involving mucus membranes or skin necrosis: Unknown Has patient had a PCN reaction that required hospitalization: Unknown Has patient had a PCN reaction occurring within the last 10 years: No If all of the above answers are "NO", then may proceed with Cephalosporin use.   Niacin Rash   Penicillins Rash    Has patient had a PCN reaction causing  immediate rash, facial/tongue/throat swelling, SOB or lightheadedness with hypotension: Yes Has patient had a PCN reaction causing severe rash involving mucus membranes or skin necrosis: Unknown Has patient had a PCN reaction that required hospitalization: Unknown Has patient had a PCN reaction occurring within the last 10 years: No If all of the above answers are "NO", then may proceed with Cephalosporin use.   Pravastatin Other (See Comments)    Other reaction(s): Muscle Pain   Rosuvastatin Other (See Comments)    Other reaction(s): Other (See Comments) GI bleed   Simvastatin Other (See Comments)    Other reaction(s): Muscle Pain     REVIEW OF SYSTEMS (Negative unless checked)  Constitutional: '[]' Weight loss  '[]' Fever  '[]' Chills Cardiac: '[]' Chest pain   '[]' Chest pressure   '[]' Palpitations   '[]' Shortness of breath when laying flat   '[]' Shortness of breath at rest   '[]' Shortness of breath with exertion. Vascular:  '[x]' Pain in legs with walking   '[x]' Pain in legs at rest   '[]' Pain in legs when laying flat   '[x]' Claudication   '[]' Pain in feet when walking  '[]' Pain in feet at rest  '[]' Pain in feet when laying flat   '[]' History of DVT   '[]' Phlebitis   '[]' Swelling in legs   '[]' Varicose veins   '[]' Non-healing ulcers Pulmonary:   '[]' Uses home oxygen   '[]' Productive cough   '[]' Hemoptysis   '[]' Wheeze  '[]' COPD   '[]' Asthma Neurologic:  '[]' Dizziness  '[]' Blackouts   '[]' Seizures   '[]' History of stroke   '[]' History of TIA  '[]' Aphasia   '[]' Temporary blindness   '[]' Dysphagia   '[]' Weakness or numbness in arms   '[]' Weakness or numbness in legs Musculoskeletal:  '[x]' Arthritis   '[]' Joint swelling   '[x]' Joint pain   '[]' Low back pain Hematologic:  '[]' Easy bruising  '[]' Easy bleeding   '[]' Hypercoagulable state   '[]' Anemic   Gastrointestinal:  '[]' Blood in stool   '[]' Vomiting blood  '[]' Gastroesophageal reflux/heartburn   '[]' Abdominal pain Genitourinary:  '[]' Chronic kidney disease   '[]' Difficult urination  '[]' Frequent urination  '[]' Burning with urination    '[]' Hematuria Skin:  '[]' Rashes   '[]' Ulcers   '[]' Wounds Psychological:  '[]'   History of anxiety   '[]'  History of major depression.  Physical Examination  BP (!) 150/88 (BP Location: Right Arm)   Pulse 65   Resp 16   Wt 206 lb (93.4 kg)   BMI 27.94 kg/m  Gen:  WD/WN, NAD Head: [place]/AT, No temporalis wasting. Ear/Nose/Throat: Hearing grossly intact, nares w/o erythema or drainage Eyes: Conjunctiva clear. Sclera non-icteric Neck: Supple.  Trachea midline Pulmonary:  Good air movement, no use of accessory muscles.  Cardiac: Irregular Vascular:  Vessel Right Left  Radial Palpable Palpable                          PT Palpable Palpable  DP Palpable Palpable   Gastrointestinal: soft, non-tender/non-distended. No guarding/reflex.  Musculoskeletal: M/S 5/5 throughout.  No deformity or atrophy.  No significant lower extremity edema. Neurologic: Sensation grossly intact in extremities.  Symmetrical.  Speech is fluent.  Psychiatric: Judgment intact, Mood & affect appropriate for pt's clinical situation. Dermatologic: No rashes or ulcers noted.  No cellulitis or open wounds.      Labs Recent Results (from the past 2160 hour(s))  Surgical pathology     Status: None   Collection Time: 07/24/20  1:55 PM  Result Value Ref Range   SURGICAL PATHOLOGY      SURGICAL PATHOLOGY CASE: [id number] PATIENT: Martin Adkins Surgical Pathology Report     Specimen Submitted: A. Colon polyp x2, desc; h snare(2) cbx(1) B. Colon polyp x2, prox asc; hot snare C. Colon polyp x3, trans; cbx(2) h snare(1) D. Colon polyp, sigmoid; cbx E. Rectum polyp x5; cbx  Clinical History: Colon cancer screening.  Colon polyps, internal hemorrhoids      DIAGNOSIS: A. COLON POLYP X2, DESCENDING; HOT SNARE AND COLD BIOPSY: - TUBULAR ADENOMA, MULTIPLE FRAGMENTS. - NEGATIVE FOR HIGH GRADE DYSPLASIA AND MALIGNANCY.  B. COLON POLYP X2, PROXIMAL ASCENDING; HOT SNARE: - TUBULAR ADENOMA, MULTIPLE FRAGMENTS. -  NEGATIVE FOR HIGH GRADE DYSPLASIA AND MALIGNANCY.  C. COLON POLYP X3, TRANSVERSE; COLD BIOPSY AND HOT SNARE: - TUBULAR ADENOMA, MULTIPLE FRAGMENTS. - NEGATIVE FOR HIGH GRADE DYSPLASIA AND MALIGNANCY.  D. COLON POLYP, SIGMOID; COLD BIOPSY: - TUBULAR ADENOMA. - NEGATIVE FOR HIGH GRADE DYSPLASIA AND MALIGNANCY.  E. RECTUM POLYP X5; C OLD BIOPSY: - WELL-DIFFERENTIATED NEUROENDOCRINE TUMOR, WHO GRADE 1, INVOLVING THE BASE OF THE SUBMITTED TISSUE (ONE FRAGMENT). - HYPERPLASTIC POLYP, TWO FRAGMENTS.  Comment: Immunohistochemical stain for synaptophysin highlights the neuroendocrine tumor.  Ki67 proliferation index is less than 3%. IHC slides were prepared by [facility], [facility], [place]. All controls stained appropriately.  This test was developed and its performance characteristics determined by [facility]. It has not been cleared or approved by the [place] Food and Drug Administration. The FDA does not require this test to go through premarket FDA review. This test is used for clinical purposes. It should not be regarded as investigational or for research. This laboratory is certified under the Clinical Laboratory Improvement Amendments (CLIA) as qualified to perform high complexity clinical laboratory testing.   GROSS DESCRIPTION: A. Labeled: Hot snare x2/cbx x1 descending  colon polyps Received: Formalin Collection time: 1:55 PM on 07/24/2020 Placed into formalin time: 1:55 PM on 07/24/2020 Tissue fragment(s): Multiple Size: Aggregate, 2.5 x 0.8 x 0.5 cm Description: Received are fragments of tan-pink soft tissue admixed with intestinal debris.  The ratio of soft tissue to intestinal debris is 90: 10.  The 2 larger soft tissue fragments have resection margins which are  differentially inked.  These fragments are bisected. Entirely submitted in cassettes 1-2 with the bisected fragments in cassette 1 and the remaining fragments in cassette 2.  B. Labeled:  Hot snare proximal ascending colon polyp x2 Received: Formalin Collection time: 1:59 PM on 07/24/2020 Placed into formalin time: 1:59 PM on 07/24/2020 Tissue fragment(s): Multiple Size: Aggregate, 2.2 x 1 x 0.3 cm Description: Received are fragments of tan soft tissue admixed with intestinal debris.  The ratio of soft tissue to intestinal debris is 60: 40. Entirely submitted in 1 cassett e.  C. Labeled: cbx x2/hot snare x1 transverse colon polyp Received: Formalin Collection time: 2:05 PM on 07/24/2020 Placed into formalin time: 2:05 PM on 07/24/2020 Tissue fragment(s): Multiple Size: Aggregate, 1.5 x 0.5 x 0.3 cm Description: Received are fragments of tan soft tissue admixed with intestinal debris.  The ratio of soft tissue to intestinal debris is 80: 20. Entirely submitted in 1 cassette.  D. Labeled: cbx sigmoid colon polyp Received: Formalin Collection time: 2:14 PM on 07/24/2020 Placed into formalin time: 2:14 PM on 07/24/2020 Tissue fragment(s): 1 Size: 0.4 x 0.4 x 0.2 cm Description: Tan soft tissue fragment Entirely submitted in 1 cassette.  E. Labeled: cbx rectal polyp x5 Received: Formalin Collection time: 2:14 PM on 07/24/2020 Placed into formalin time: 2:14 PM on 07/24/2020 Tissue fragment(s): Multiple Size: Aggregate, 1.5 x 0.7 x 0.2 cm Description: Received are fragments of tan-white soft tissue admixed with intestinal debris.  The r atio of soft tissue to intestinal debris is 60: 40. Entirely submitted in 1 cassette.  RB 07/25/2020  Final Diagnosis performed by Betsy Pries, MD.   Electronically signed 07/27/2020 11:57:15AM The electronic signature indicates that the named Attending Pathologist has evaluated the specimen Technical component performed at [facility], [address], [place], [place] [address] Lab: [phone] Dir: Rush Farmer, MD, MMM  Professional component performed at [facility], [facility], [place], [place],  [place] [address] Lab: [phone] Dir: Dellia Nims. Rubinas, MD   Flow cytometry panel-leukemia/lymphoma work-up     Status: None   Collection Time: 08/22/20  1:43 PM  Result Value Ref Range   PATH INTERP XXX-IMP Comment     Comment: (NOTE) Dim CD5+, CD23-, FMC7+, CD38- (17%), CD10-, CD103-, CD20+ clonal B cell population detected, 27% of leukocytes, <5000 cells/uL. (See comment.)    ANNOTATION COMMENT IMP Comment     Comment: (NOTE) The differential diagnosis is favored to include an atypical chronic lymphocytic leukemia/small lymphocytic lymphoma and mantle cell lymphoma, as well as other mature B-cell lymphomas with occasional CD5 co-expression, including marginal zone B-cell lymphoma and lymphoplasmacytic lymphoma. If the patient is not known to have a leukemia or lymphoma, further investigation, including FISH to detect the t(11;14) translocation present in most MCLs, is recommended, if clinically indicated. In the absence of B-cell leukemia or lymphoma, small (<5000/uL) clonal B-cell populations in the blood are classified as monoclonal B-cell lymphocytosis. Clinical correlation is required. Previous phenotyping reports from 09/27/19 and 06/30/18 were reviewed and showed no mature B cells.    CLINICAL INFO Comment     Comment: (NOTE) A recent CBC was not available for review at the time this report was prepared.    Specimen Type Comment     Comment: Peripheral blood   ASSESSMENT OF LEUKOCYTES Comment     Comment: (NOTE) A dim CD5+, CD23-, FMC7+, CD38- (17%), CD10-, CD103- monoclonal B cell population is detected with kappa light chain restriction representing 99% of B cells and 27% of leukocytes.  There is no loss of, or aberrant expression of, the pan T cell antigens to suggest a neoplastic T cell process. CD4:CD8 ratio 3.4 No circulating blasts are detected. There is no immunophenotypic evidence of abnormal myeloid maturation. Analysis of the leukocyte population shows:  granulocytes 48%, monocytes 5%, lymphocytes 47%, blasts <0.1%, B cells 27%, T cells 19%, NK cells 1%.    % Viable Cells Comment     Comment: 95%   Immunophenotypic Profile 27% of total cells (Phenotype below)     Comment: Comment Abnormal cell population: present    ANALYSIS AND GATING STRATEGY Comment     Comment: 8 color analysis with CD45/SSC gating   IMMUNOPHENOTYPING STUDY Comment     Comment: (NOTE) CD2       (-)            CD3       (-) CD4       (-)            CD5       (+) Dim CD7       (-)            CD8       (-) CD10      (-)            CD11b     (-) CD11c     (+)            CD13      (-) CD14      (-)            CD15      (-) CD16      (-)            CD19      (+) Moderate CD20      (+)            CD22      (+) CD23      (-)            CD33      (-) CD34      (-)            CD38      (-) CD45      (+)            CD56      (-) CD57      (-)            CD103     (-) CD117     (-)            FMC-7     (+) HLA-DR    (+)            KAPPA     (+) LAMBDA    (-)            CD64      (-)    PATHOLOGIST NAME Comment     Comment: Lovett Sox, M.D.   COMMENT: Comment     Comment: (NOTE) Each antibody in this assay was utilized to assess for potential abnormalities of studied cell populations or to characterize identified abnormalities. This test was developed and its performance characteristics determined by [facility].  It has not been cleared or approved by the U.S. Food and Drug Administration. The FDA has determined that such clearance or approval is not necessary. This test is used for clinical purposes.  It should not be regarded as investigational or for research. Performed  At: -[facility] [address] [place], [place] [id number] Katina Degree MDPhD [id number] Performed At: [facility] [address] [place], [place] [id number] Katina Degree MDPhD [id number]   Lactate dehydrogenase     Status: None   Collection Time: 08/22/20  1:43 PM  Result  Value Ref Range   LDH 134 98 - 192 U/L    Comment: Performed at [facility], [address] [place], [place] [address]  Comprehensive metabolic panel     Status: Abnormal   Collection Time: 08/22/20  1:43 PM  Result Value Ref Range   Sodium 139 135 - 145 mmol/L   Potassium 3.6 3.5 - 5.1 mmol/L   Chloride 106 98 - 111 mmol/L   CO2 25 22 - 32 mmol/L   Glucose, Bld 122 (H) 70 - 99 mg/dL    Comment: Glucose reference range applies only to samples taken after fasting for at least 8 hours.   BUN 14 8 - 23 mg/dL   Creatinine, Ser 0.84 0.61 - 1.24 mg/dL   Calcium 9.2 8.9 - 10.3 mg/dL   Total Protein 7.1 6.5 - 8.1 g/dL   Albumin 4.2 3.5 - 5.0 g/dL   AST 42 (H) 15 - 41 U/L   ALT 32 0 - 44 U/L   Alkaline Phosphatase 83 38 - 126 U/L   Total Bilirubin 1.7 (H) 0.3 - 1.2 mg/dL   GFR, Estimated >60 >60 mL/min    Comment: (NOTE) Calculated using the CKD-EPI Creatinine Equation (2021)    Anion gap 8 5 - 15    Comment: Performed at [facility], [address] [place], [place] [address]  CBC with Differential/Platelet     Status: Abnormal   Collection Time: 08/22/20  1:43 PM  Result Value Ref Range   WBC 6.3 4.0 - 10.5 K/uL   RBC 4.87 4.22 - 5.81 MIL/uL   Hemoglobin 14.3 13.0 - 17.0 g/dL   HCT 43.3 39.0 - 52.0 %   MCV 88.9 80.0 - 100.0 fL   MCH 29.4 26.0 - 34.0 pg   MCHC 33.0 30.0 - 36.0 g/dL   RDW 15.2 11.5 - 15.5 %   Platelets 134 (L) 150 - 400 K/uL   nRBC 0.0 0.0 - 0.2 %   Neutrophils Relative % 46 %   Neutro Abs 2.9 1.7 - 7.7 K/uL   Lymphocytes Relative 42 %   Lymphs Abs 2.6 0.7 - 4.0 K/uL   Monocytes Relative 10 %   Monocytes Absolute 0.7 0.1 - 1.0 K/uL   Eosinophils Relative 2 %   Eosinophils Absolute 0.1 0.0 - 0.5 K/uL   Basophils Relative 0 %   Basophils Absolute 0.0 0.0 - 0.1 K/uL   WBC Morphology SMUDGE CELLS     Comment: DIFF CONFIRMED BY MANUAL. CONSISTANT WITH KNOWN CLL.   RBC Morphology UNREMARKABLE    Smear Review Normal platelet morphology      Comment: PLATELETS APPEAR DECREASED   Immature Granulocytes 0 %   Abs Immature Granulocytes 0.01 0.00 - 0.07 K/uL    Comment: Performed at [facility], [address], [place], [place] [address]    Radiology No results found.  Assessment/Plan  AAA (abdominal aortic aneurysm) without rupture Non-invasive studies today demonstrate stable 5.3 cm diameter aortic sac size without evidence of endoleak.  Doing well.  No changes at this time.  Recheck in 1 year  Peripheral vascular disease ([place]) ABIs today are noncompressible although the waveforms are quite good  overall.  The digit pressure is 122 on the right and 80 on the left.  No limb threatening symptoms.  On appropriate medications.  Recheck in 1 year.  Type 2 diabetes mellitus (HCC) blood glucose control important in reducing the progression of atherosclerotic disease. Also, involved in wound healing. On appropriate medications.   Carotid stenosis Patient reports being told several years ago that he had some degree of "blockage in his neck arteries".  I do not see an ultrasound or other study that would give the degree of stenosis and he says it has not been checked in many years.  We will do a carotid duplex with his next follow-up.  Benign essential HTN blood pressure control important in reducing the progression of atherosclerotic disease. On appropriate oral medications.    Leotis Pain, MD  09/26/2020 11:06 AM    This note was created with Dragon medical transcription system.  Any errors from dictation are purely unintentional

## 2020-09-26 NOTE — Assessment & Plan Note (Signed)
blood pressure control important in reducing the progression of atherosclerotic disease. On appropriate oral medications.  

## 2020-09-26 NOTE — Assessment & Plan Note (Signed)
Non-invasive studies today demonstrate stable 5.3 cm diameter aortic sac size without evidence of endoleak.  Doing well.  No changes at this time.  Recheck in 1 year

## 2020-09-26 NOTE — Assessment & Plan Note (Signed)
ABIs today are noncompressible although the waveforms are quite good overall.  The digit pressure is 122 on the right and 80 on the left.  No limb threatening symptoms.  On appropriate medications.  Recheck in 1 year.

## 2020-09-26 NOTE — Assessment & Plan Note (Signed)
blood glucose control important in reducing the progression of atherosclerotic disease. Also, involved in wound healing. On appropriate medications.  

## 2020-09-28 ENCOUNTER — Other Ambulatory Visit: Payer: Medicare PPO

## 2020-09-28 ENCOUNTER — Encounter (HOSPITAL_COMMUNITY): Payer: Self-pay | Admitting: Gastroenterology

## 2020-09-28 NOTE — Progress Notes (Signed)
Attempted to obtain medical history via telephone, unable to reach at this time. I left a voicemail to return pre surgical testing department's phone call.  

## 2020-10-02 ENCOUNTER — Telehealth: Payer: Self-pay | Admitting: Gastroenterology

## 2020-10-02 NOTE — Telephone Encounter (Signed)
The pt had concerns about finding mag citrate.  We discussed that mag citrate was recalled and he should use 119 gm of miralax in place of the mag citrate. He verbalized understanding. He also advised that he heard from cardiology that it is ok to hold xarelto 2 days prior to procedure.  I have sent a message for that office to send [place] something in writing for the chart.

## 2020-10-02 NOTE — Telephone Encounter (Signed)
Patient called requesting to speak with you to go over prep instructions.

## 2020-10-05 ENCOUNTER — Ambulatory Visit: Payer: Medicare PPO | Admitting: Oncology

## 2020-10-06 NOTE — Anesthesia Preprocedure Evaluation (Addendum)
Anesthesia Evaluation  Patient identified by MRN, date of birth, ID band Patient awake    Reviewed: Allergy & Precautions, NPO status , Patient's Chart, lab work & pertinent test results  History of Anesthesia Complications Negative for: history of anesthetic complications  Airway Mallampati: II  TM Distance: >3 FB Neck ROM: Full    Dental  (+) Poor Dentition, Dental Advisory Given   Pulmonary sleep apnea , neg COPD, Current Smoker and Patient abstained from smoking.,    Pulmonary exam normal - rhonchi (-) wheezing      Cardiovascular hypertension, + CAD, + Past MI, + Cardiac Stents, + CABG (1998) and +CHF  Normal cardiovascular exam+ dysrhythmias Atrial Fibrillation  - Systolic murmurs and - Diastolic murmurs Echo 3/56/86: MODERATE LV SYSTOLIC DYSFUNCTION   NORMAL RIGHT VENTRICULAR SYSTOLIC FUNCTION  MILD VALVULAR REGURGITATION  NO VALVULAR STENOSIS  MILD AR, TR, PR  TRIVIAL MR  EF 35-40%    L heart cath 01/19/18: 90% first diagonal.  RCA had an acute marginal with a 99% stenosis.  RCA had no significant disease.  Vein graft to RCA is chronically occluded.  Vein graft to diagonal is chronically occluded.  Vein graft to OM has a stent with proximal occlusion.  LIMA to the LAD is widely patent.  Revealed films suggest medical management.  Not amenable to PCI or further coronary bypass grafting.  EF 35 to 45% globally.  Cardiac cath completed with no immediate complications.  Patient has occluded proximal circumflex, occluded proximal to mid LAD.   Neuro/Psych neg Seizures negative neurological ROS  negative psych ROS   GI/Hepatic Neg liver ROS, hiatal hernia, GERD  ,  Endo/Other  diabetes  Renal/GU negative Renal ROS     Musculoskeletal  (+) Arthritis ,   Abdominal (+) - obese,   Peds  Hematology negative hematology ROS (+)   Anesthesia Other Findings   Reproductive/Obstetrics                            Anesthesia Physical  Anesthesia Plan  ASA: 3  Anesthesia Plan: MAC   Post-op Pain Management:    Induction:   PONV Risk Score and Plan: 1 and Ondansetron and Propofol infusion  Airway Management Planned: Natural Airway, Simple Face Mask and Nasal Cannula  Additional Equipment: None  Intra-op Plan:   Post-operative Plan:   Informed Consent: I have reviewed the patients History and Physical, chart, labs and discussed the procedure including the risks, benefits and alternatives for the proposed anesthesia with the patient or authorized representative who has indicated his/her understanding and acceptance.     Dental advisory given  Plan Discussed with: Anesthesiologist  Anesthesia Plan Comments:        Anesthesia Quick Evaluation

## 2020-10-09 ENCOUNTER — Ambulatory Visit (HOSPITAL_COMMUNITY): Payer: Medicare PPO | Admitting: Anesthesiology

## 2020-10-09 ENCOUNTER — Encounter (HOSPITAL_COMMUNITY)
Admission: RE | Disposition: A | Discharge status: Home / Self Care | Payer: Self-pay | Source: Home / Self Care | Attending: Gastroenterology

## 2020-10-09 ENCOUNTER — Encounter (HOSPITAL_COMMUNITY): Payer: Self-pay | Admitting: Gastroenterology

## 2020-10-09 ENCOUNTER — Ambulatory Visit (HOSPITAL_COMMUNITY)
Admission: RE | Admit: 2020-10-09 | Discharge: 2020-10-09 | Disposition: A | Discharge status: Home / Self Care | Payer: Medicare PPO | Attending: Gastroenterology | Admitting: Gastroenterology

## 2020-10-09 DIAGNOSIS — K642 Third degree hemorrhoids: Secondary | ICD-10-CM | POA: Insufficient documentation

## 2020-10-09 DIAGNOSIS — K621 Rectal polyp: Secondary | ICD-10-CM | POA: Diagnosis not present

## 2020-10-09 DIAGNOSIS — K6289 Other specified diseases of anus and rectum: Secondary | ICD-10-CM | POA: Diagnosis not present

## 2020-10-09 DIAGNOSIS — Z880 Allergy status to penicillin: Secondary | ICD-10-CM | POA: Insufficient documentation

## 2020-10-09 DIAGNOSIS — F17210 Nicotine dependence, cigarettes, uncomplicated: Secondary | ICD-10-CM | POA: Diagnosis not present

## 2020-10-09 DIAGNOSIS — Z888 Allergy status to other drugs, medicaments and biological substances status: Secondary | ICD-10-CM | POA: Diagnosis not present

## 2020-10-09 DIAGNOSIS — D3A026 Benign carcinoid tumor of the rectum: Secondary | ICD-10-CM | POA: Diagnosis present

## 2020-10-09 DIAGNOSIS — K635 Polyp of colon: Secondary | ICD-10-CM | POA: Diagnosis not present

## 2020-10-09 DIAGNOSIS — K644 Residual hemorrhoidal skin tags: Secondary | ICD-10-CM | POA: Insufficient documentation

## 2020-10-09 HISTORY — PX: COLONOSCOPY: SHX5424

## 2020-10-09 HISTORY — PX: SUBMUCOSAL LIFTING INJECTION: SHX6855

## 2020-10-09 HISTORY — PX: POLYPECTOMY: SHX5525

## 2020-10-09 HISTORY — PX: EUS: SHX5427

## 2020-10-09 SURGERY — ULTRASOUND, LOWER GI TRACT, ENDOSCOPIC
Anesthesia: Monitor Anesthesia Care

## 2020-10-09 MED ORDER — PROPOFOL 10 MG/ML IV BOLUS
INTRAVENOUS | Status: DC | PRN
  Administered 2020-10-09: 20 mg via INTRAVENOUS
  Administered 2020-10-09: 50 mg via INTRAVENOUS
  Administered 2020-10-09: 20 mg via INTRAVENOUS

## 2020-10-09 MED ORDER — ONDANSETRON HCL 4 MG/2ML IJ SOLN
INTRAMUSCULAR | Status: DC | PRN
  Administered 2020-10-09: 4 mg via INTRAVENOUS

## 2020-10-09 MED ORDER — PROPOFOL 500 MG/50ML IV EMUL
INTRAVENOUS | Status: DC | PRN
  Administered 2020-10-09: 100 ug/kg/min via INTRAVENOUS

## 2020-10-09 MED ORDER — ALBUTEROL SULFATE (2.5 MG/3ML) 0.083% IN NEBU
2.5000 mg | INHALATION_SOLUTION | Freq: Once | RESPIRATORY_TRACT | Status: AC
  Administered 2020-10-09: 2.5 mg via RESPIRATORY_TRACT

## 2020-10-09 MED ORDER — RIVAROXABAN 20 MG PO TABS: 20.0000 mg | ORAL_TABLET | Freq: Every day | ORAL | 3 refills | Status: DC

## 2020-10-09 MED ORDER — ALBUTEROL SULFATE (2.5 MG/3ML) 0.083% IN NEBU
INHALATION_SOLUTION | RESPIRATORY_TRACT | Status: AC
  Filled 2020-10-09: qty 3

## 2020-10-09 MED ORDER — LIDOCAINE 2% (20 MG/ML) 5 ML SYRINGE
INTRAMUSCULAR | Status: DC | PRN
  Administered 2020-10-09: 60 mg via INTRAVENOUS

## 2020-10-09 MED ORDER — LACTATED RINGERS IV SOLN: INTRAVENOUS | Status: DC | PRN

## 2020-10-09 NOTE — Progress Notes (Signed)
1200- pt received albuterol neb treatment in recovery. Pt maintaing spO2 91-94% on room air. Pt in NAD- see vitals. MD Tobias Alexander okay with pt to be discharged at this time.

## 2020-10-09 NOTE — Progress Notes (Signed)
1150- Pt dropped O2 sat on RA from 92-93% to 86-88% on RA. Reinitiated O2 with [place] then simple mask up to 8L.Breath sounds diminished bilaterally. Encouraged cough, deep breathe, instructed on use of incentive spirometer. Dr Tobias Alexander informed, orders received for albuterol nebulizer treatment and given.

## 2020-10-09 NOTE — Op Note (Signed)
[facility] Patient Name: Martin Adkins Procedure Date: 10/09/2020 MRN: [id number] Attending MD: Justice Britain , MD Date of Birth: 09/30/1945 CSN: [id number] Age: 75 Admit Type: Outpatient Procedure:                Lower EUS Indications:              Rectal deformity found on endoscopy; subepithelial                            tumor versus extrinsic compression, Neuroendocrine                            Tumor Providers:                Justice Britain, MD Referring MD:             Dr. Redmond Pulling. Alice Reichert MD, MD, [name],                            MD Medicines:                Monitored Anesthesia Care Complications:            No immediate complications. Estimated Blood Loss:     Estimated blood loss was minimal. Procedure:                Pre-Anesthesia Assessment:                           - Prior to the procedure, a History and Physical                            was performed, and patient medications and                            allergies were reviewed. The patient's tolerance of                            previous anesthesia was also reviewed. The risks                            and benefits of the procedure and the sedation                            options and risks were discussed with the patient.                            All questions were answered, and informed consent                            was obtained. Prior Anticoagulants: The patient has                            taken Xarelto (rivaroxaban), last dose was 3 days                            prior  to procedure. ASA Grade Assessment: II - A                            patient with mild systemic disease. After reviewing                            the risks and benefits, the patient was deemed in                            satisfactory condition to undergo the procedure.                           After obtaining informed consent, the endoscope was                             passed under direct vision. Throughout the                            procedure, the patient's blood pressure, pulse, and                            oxygen saturations were monitored continuously. The                            [id number] ([id number]) Olympus therapeutic endoscope                            was introduced through the anus and advanced to the                            the descending colon. The [id number]-[id number] ([id number])                            Olympus radial ultrasound scope was introduced                            through the anus and advanced to the the                            rectosigmoid junction. The lower EUS was                            accomplished without difficulty. The patient                            tolerated the procedure. The quality of the bowel                            preparation was fair.                           EUS Images were not able to be attached to  [employer] but are available on Media Tab. Scope In: Scope Out: Findings:      ENDOSCOPIC FINDING: :      A large amount of semi-solid solid stool was found in the sigmoid colon       and in the descending colon, interfering with visualization. Lavage of       the area was performed using copious amounts, resulting in clearance       with fair visualization.      >30 sessile and semi-sessile polyps were found in the rectum,       recto-sigmoid colon and sigmoid colon. The polyps were 1 to 5 mm in       size. 17 of the polyps from the rectum were removed with a cold snare.       Resection and retrieval were complete.      Non-bleeding non-thrombosed external and internal hemorrhoids were found       during retroflexion, during perianal exam and during digital exam. The       hemorrhoids were Grade III (internal hemorrhoids that prolapse but       require manual reduction).      ENDOSONOGRAPHIC FINDING: :      The rectosigmoid junction was normal.      The rectum was  normal.      The perirectal space was normal.      No malignant-appearing lymph nodes were visualized in the perirectal       region and in the left iliac region. The nodes were.      The internal anal sphincter was visualized endosonographically and       appeared normal. Impression:               FLEX Impression:                           - Stool in the sigmoid colon and in the descending                            colon.                           - Preparation of the colon was fair even after                            significant lavage - however large stool began to                            come through the rest of the colon as procedure                            continued.                           - >30 1 to 5 mm polyps in the rectum, at the                            recto-sigmoid colon and in the sigmoid colon. 16 of  the polyps which were in the rectum were removed                            with a cold snare. Resected and retrieved.                           - Non-bleeding non-thrombosed external and internal                            hemorrhoids.                           EUS Impression:                           - Endosonographic imaging showed no sign of                            significant pathology at the rectosigmoid junction.                           - Endosonographic images of the rectum were                            unremarkable.                           - Endosonographic images of the perirectal space                            were unremarkable.                           - No malignant-appearing lymph nodes were                            visualized endosonographically in the perirectal                            region and in the left iliac region.                           - The internal anal sphincter was visualized                            endosonographically and appeared normal. Moderate Sedation:      Not Applicable -  Patient had care per Anesthesia. Recommendation:           - The patient will be observed post-procedure,                            until all discharge criteria are met.                           - Discharge patient to home.                           -  Patient has a contact number available for                            emergencies. The signs and symptoms of potential                            delayed complications were discussed with the                            patient. Return to normal activities tomorrow.                            Written discharge instructions were provided to the                            patient.                           - High fiber diet.                           - Use FiberCon 1-2 tablets PO daily.                           - Monitor for signs/symptoms of bleeding,                            perforation, and infection. If issues please call                            our number to get further assistance as needed.                           - Await path results.                           - Restart Xarelto on 10/5 AM.                           - Continue other medications as normal.                           - Pending final pathology, will discuss the                            consideration of repeat EUS in 6-12 months and                            going on surveillance protocol to monitor the area.                            None of the polyps had typical appearance of NETs.                           - The findings and recommendations were discussed  with the patient.                           - The findings and recommendations were discussed                            with the patient's family. Procedure Code(s):        --- Professional ---                           [phone], 52, Sigmoidoscopy, flexible; with endoscopic                            ultrasound examination                           60454, 52, Sigmoidoscopy,  flexible; with removal of                            tumor(s), polyp(s), or other lesion(s) by snare                            technique Diagnosis Code(s):        --- Professional ---                           K64.2, Third degree hemorrhoids                           K62.1, Rectal polyp                           K63.5, Polyp of colon                           I89.9, Noninfective disorder of lymphatic vessels                            and lymph nodes, unspecified                           K62.89, Other specified diseases of anus and rectum CPT copyright 2019 American Medical Association. All rights reserved. The codes documented in this report are preliminary and upon coder review may  be revised to meet current compliance requirements. Justice Britain, MD 10/09/2020 11:25:37 AM Number of Addenda: 0

## 2020-10-09 NOTE — Discharge Instructions (Signed)

## 2020-10-09 NOTE — H&P (Signed)
GASTROENTEROLOGY PROCEDURE H&P NOTE   Primary Care Physician: [name], MD  HPI: Martin Adkins is a 75 y.o. male who presents for Flexible sigmoidoscopy with endoscopic ultrasound to evaluate recent findings of rectal carcinoid/NET that were removed with forcep biopsies by Dr. Alice Reichert.  Past Medical History:  Diagnosis Date   AAA (abdominal aortic aneurysm) without rupture 04/05/2014   AAA (abdominal aortic aneurysm) without rupture 04/05/2014   Abdominal aortic aneurysm without rupture    Acute non-ST elevation myocardial infarction (NSTEMI) ([place]) 08/15/2014   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/28/2014   APS (antiphospholipid syndrome) ([place])    Arteriosclerosis of coronary artery 04/05/2014   Overview:  Sp cabg with lima to lad svg to d1 om1 rca 2004 with occluded svg to rca and d1 and pci stetn of om1 graft 2015    Arthritis    B12 deficiency 03/21/2017   Barrett's esophagus    Benign essential HTN 05/02/2014   BPH (benign prostatic hyperplasia)    CAD (coronary artery disease)    CHF (congestive heart failure) (HCC)    Chronic systolic heart failure ([place]) 04/19/2014   Overview:  With segmental LV systolic dysfunction ejection fraction of 35%    CLL (chronic lymphocytic leukemia) (HCC)    CLL (chronic lymphocytic leukemia) ([place]) 05/24/2017   DVT (deep venous thrombosis) (HCC)    Dysrhythmia    ATRIAL FIB.   GERD (gastroesophageal reflux disease)    History of hiatal hernia    History of shingles 2013   Hyperlipemia    Hyperplastic polyps of stomach    Hypertension    Myocardial infarction ([place]) 07/30/2014   NSTEMI (non-ST elevated myocardial infarction) (HCC)    OSA (obstructive sleep apnea)    does not use CPAP   Osteoarthritis    Pneumonia    Pre-diabetes    PVD (peripheral vascular disease) (HCC)    RA (rheumatoid arthritis) (HCC)    SOB (shortness of breath)    Past Surgical History:  Procedure Laterality Date   ABDOMINAL AORTA STENT     CHOLECYSTECTOMY      COLONOSCOPY  11/24/2009   COLONOSCOPY WITH PROPOFOL N/A 07/24/2020   Procedure: COLONOSCOPY WITH PROPOFOL;  Surgeon: Toledo, Benay Pike, MD;  Location: [facility] END[facility];  Service: Gastroenterology;  Laterality: N/A;   CORONARY ANGIOPLASTY WITH STENT PLACEMENT  2015   CORONARY ARTERY BYPASS GRAFT     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   Procedure: DUPUYTREN CONTRACTURE RELEASE LEFT LONG FINGER;  Surgeon: Corky Mull, MD;  Location: [facility] OR[facility];  Service: Orthopedics;  Laterality: Left;   ENDOVASCULAR REPAIR/STENT GRAFT N/A 09/24/2017   Procedure: ENDOVASCULAR REPAIR/STENT GRAFT;  Surgeon: Algernon Huxley, MD;  Location: [place] [facility];  Service: Cardiovascular;  Laterality: N/A;   ESOPHAGOGASTRODUODENOSCOPY  05/26/02  03/23/12   ESOPHAGOGASTRODUODENOSCOPY (EGD) WITH PROPOFOL N/A 10/28/2016   Procedure: ESOPHAGOGASTRODUODENOSCOPY (EGD) WITH PROPOFOL;  Surgeon: Manya Silvas, MD;  Location: [facility];  Service: Endoscopy;  Laterality: N/A;   HERNIA REPAIR Left    inguinal   INCISION AND DRAINAGE ABSCESS N/A 06/14/2020   Procedure: INCISION AND DRAINAGE ABSCESS;  Surgeon: Herbert Pun, MD;  Location: [facility] OR[facility];  Service: General;  Laterality: N/A;   LEFT HEART CATH AND CORONARY ANGIOGRAPHY N/A 01/19/2018   Procedure: LEFT HEART CATH AND CORONARY ANGIOGRAPHY;  Surgeon: Teodoro Spray, MD;  Location: [place] [facility];  Service: Cardiovascular;  Laterality: N/A;   PERIPHERAL VASCULAR CATHETERIZATION N/A 09/06/2014   Procedure: IVC Filter Removal;  Surgeon:  Katha Cabal, MD;  Location: [place] [facility];  Service: Cardiovascular;  Laterality: N/A;   TRANSURETHRAL RESECTION OF PROSTATE N/A 02/20/2015   Procedure: TRANSURETHRAL RESECTION OF THE PROSTATE (TURP);  Surgeon: Hollice Espy, MD;  Location: [facility] OR[facility];  Service: Urology;  Laterality: N/A;   No current facility-administered medications for this encounter.   No current facility-administered medications for this  encounter. Allergies  Allergen Reactions   Ace Inhibitors Other (See Comments)    Other reaction(s): Cough   Amoxicillin Rash    Has patient had a PCN reaction causing immediate rash, facial/tongue/throat swelling, SOB or lightheadedness with hypotension: Yes Has patient had a PCN reaction causing severe rash involving mucus membranes or skin necrosis: Unknown Has patient had a PCN reaction that required hospitalization: Unknown Has patient had a PCN reaction occurring within the last 10 years: No If all of the above answers are "NO", then may proceed with Cephalosporin use.   Niacin Rash   Penicillins Rash    Has patient had a PCN reaction causing immediate rash, facial/tongue/throat swelling, SOB or lightheadedness with hypotension: Yes Has patient had a PCN reaction causing severe rash involving mucus membranes or skin necrosis: Unknown Has patient had a PCN reaction that required hospitalization: Unknown Has patient had a PCN reaction occurring within the last 10 years: No If all of the above answers are "NO", then may proceed with Cephalosporin use.   Pravastatin Other (See Comments)    Other reaction(s): Muscle Pain   Rosuvastatin Other (See Comments)    Other reaction(s): Other (See Comments) GI bleed   Simvastatin Other (See Comments)    Other reaction(s): Muscle Pain   Family History  Problem Relation Age of Onset   Cancer Mother 41       breast ca   Diabetes Mother    Coronary artery disease Father    Heart attack Father    Prostate cancer Brother    Bladder Cancer Neg Hx    Kidney cancer Neg Hx    Social History   Socioeconomic History   Marital status: Legally Separated    Spouse name: Not on file   Number of children: Not on file   Years of education: Not on file   Highest education level: Not on file  Occupational History   Not on file  Tobacco Use   Smoking status: Some Days    Packs/day: 0.50    Years: 20.00    Pack years: 10.00    Types: Cigarettes    Smokeless tobacco: Never   Tobacco comments:    sometimes 2-3 a day  Vaping Use   Vaping Use: Never used  Substance and Sexual Activity   Alcohol use: Yes    Alcohol/week: 1.0 - 2.0 standard drink    Types: 1 - 2 Shots of liquor per week   Drug use: No   Sexual activity: Never  Other Topics Concern   Not on file  Social History Narrative   Not on file   Social Determinants of Health   Financial Resource Strain: Not on file  Food Insecurity: Not on file  Transportation Needs: Not on file  Physical Activity: Not on file  Stress: Not on file  Social Connections: Not on file  Intimate Partner Violence: Not on file    Physical Exam: Today's Vitals   10/09/20 0730  BP: (!) 155/89  Resp: 20  Temp: 98.4 F (36.9 C)  TempSrc: Oral  SpO2: 99%  Weight: 94.8 kg  Height:  6' (1.829 m)  PainSc: 0-No pain   Body mass index is 28.35 kg/m. GEN: NAD EYE: Sclerae anicteric ENT: MMM CV: Non-tachycardic GI: Soft, NT/ND NEURO:  Alert & Oriented x 3  Lab Results: No results for input(s): WBC, HGB, HCT, PLT in the last 72 hours. BMET No results for input(s): NA, K, CL, CO2, GLUCOSE, BUN, CREATININE, CALCIUM in the last 72 hours. LFT No results for input(s): PROT, ALBUMIN, AST, ALT, ALKPHOS, BILITOT, BILIDIR, IBILI in the last 72 hours. PT/INR No results for input(s): LABPROT, INR in the last 72 hours.   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 Alice Reichert.  The risks of an EUS including intestinal perforation, bleeding, infection, aspiration, and medication effects were discussed as was the possibility it may not give a definitive diagnosis if a biopsy is performed.    The risks and benefits of endoscopic evaluation/treatment were discussed with the patient and/or family; these include but are not limited to the risk  of perforation, infection, bleeding, missed lesions, lack of diagnosis, severe illness requiring hospitalization, as well as anesthesia and sedation related illnesses.  The patient's history has been reviewed, patient examined, no change in status, and deemed stable for procedure.  The patient and/or family is agreeable to proceed.    Justice Britain, MD [facility] Office # [phone]

## 2020-10-09 NOTE — Transfer of Care (Signed)
Immediate Anesthesia Transfer of Care Note  Patient: Martin Adkins  Procedure(s) Performed: LOWER ENDOSCOPIC ULTRASOUND (EUS) ENDOSCOPIC MUCOSAL RESECTION POLYPECTOMY SUBMUCOSAL LIFTING INJECTION  Patient Location: PACU  Anesthesia Type:MAC  Level of Consciousness: awake, alert , oriented and patient cooperative  Airway & Oxygen Therapy: Patient Spontanous Breathing and Patient connected to face mask oxygen  Post-op Assessment: Report given to RN and Post -op Vital signs reviewed and stable  Post vital signs: Reviewed and stable  Last Vitals:  Vitals Value Taken Time  BP    Temp    Pulse 57 10/09/20 1105  Resp 22 10/09/20 1105  SpO2 96 % 10/09/20 1105  Vitals shown include unvalidated device data.  Last Pain:  Vitals:   10/09/20 0730  TempSrc: Oral  PainSc: 0-No pain         Complications: No notable events documented.

## 2020-10-09 NOTE — Anesthesia Postprocedure Evaluation (Signed)
Anesthesia Post Note  Patient: Martin Adkins  Procedure(s) Performed: LOWER ENDOSCOPIC ULTRASOUND (EUS) ENDOSCOPIC MUCOSAL RESECTION POLYPECTOMY SUBMUCOSAL LIFTING INJECTION     Patient location during evaluation: Endoscopy Anesthesia Type: MAC Level of consciousness: awake and alert Pain management: pain level controlled Vital Signs Assessment: post-procedure vital signs reviewed and stable Respiratory status: spontaneous breathing and respiratory function stable Cardiovascular status: stable Postop Assessment: no apparent nausea or vomiting Anesthetic complications: no   No notable events documented.  Last Vitals:  Vitals:   10/09/20 1150 10/09/20 1200  BP: (!) 151/110 (!) 166/77  Pulse: 60 64  Resp: (!) 22 18  Temp:    SpO2: (S) 97% 94%    Last Pain:  Vitals:   10/09/20 1200  TempSrc:   PainSc: 0-No pain                 Jazmyn Offner DANIEL

## 2020-10-11 ENCOUNTER — Other Ambulatory Visit: Payer: Self-pay | Admitting: Surgery

## 2020-10-11 ENCOUNTER — Encounter: Payer: Self-pay | Admitting: Gastroenterology

## 2020-10-11 LAB — SURGICAL PATHOLOGY

## 2020-10-13 ENCOUNTER — Encounter: Payer: Self-pay | Admitting: Surgery

## 2020-10-13 ENCOUNTER — Encounter
Admission: RE | Admit: 2020-10-13 | Discharge: 2020-10-13 | Disposition: A | Discharge status: Home / Self Care | Payer: Medicare PPO | Source: Ambulatory Visit | Attending: Surgery | Admitting: Surgery

## 2020-10-13 ENCOUNTER — Other Ambulatory Visit: Payer: Self-pay

## 2020-10-13 NOTE — Patient Instructions (Addendum)
Your procedure is scheduled on: 10/18/20 Report to [place]. To find out your arrival time please call [phone] between 1PM - 3PM on 10/17/20.  Remember: Instructions that are not followed completely may result in serious medical risk, up to and including death, or upon the discretion of your surgeon and anesthesiologist your surgery may need to be rescheduled.     _X__ 1. Do not eat food after midnight the night before your procedure.                 No gum chewing or hard candies. You may drink clear liquids up to 2 hours                 before you are scheduled to arrive for your surgery- DO not drink clear                 liquids within 2 hours of the start of your surgery.                 Clear Liquids include:  water, apple juice without pulp, clear carbohydrate                 drink such as Clearfast or Gatorade, Black Coffee or Tea (Do not add                 anything to coffee or tea). Diabetics water only  __X__2.  On the morning of surgery brush your teeth with toothpaste and water, you                 may rinse your mouth with mouthwash if you wish.  Do not swallow any              toothpaste of mouthwash.     _X__ 3.  No Alcohol for 24 hours before or after surgery.   _X__ 4.  Do Not Smoke or use e-cigarettes For 24 Hours Prior to Your Surgery.                 Do not use any chewable tobacco products for at least 6 hours prior to                 surgery.  ____  5.  Bring all medications with you on the day of surgery if instructed.   __X__  6.  Notify your doctor if there is any change in your medical condition      (cold, fever, infections).     Do not wear jewelry, make-up, hairpins, clips or nail polish. Do not wear lotions, powders, or perfumes.  Do not shave body hair 48 hours prior to surgery. Men may shave face and neck. Do not bring valuables to the hospital.    [facility] is not responsible for any  belongings or valuables.  Contacts, dentures/partials or body piercings may not be worn into surgery. Bring a case for your contacts, glasses or hearing aids, a denture cup will be supplied. Leave your suitcase in the car. After surgery it may be brought to your room. For patients admitted to the hospital, discharge time is determined by your treatment team.   Patients discharged the day of surgery will not be allowed to drive home.   Please read over the following fact sheets that you were given:   Incentive spirometer  __X__ Take these medicines the morning of surgery with A SIP OF WATER:  1. amLODipine (NORVASC) 10 MG tablet  2. atorvastatin (LIPITOR) 80 MG tablet  3. gabapentin (NEURONTIN) 300 MG capsule  4. isosorbide mononitrate (IMDUR) 60 MG 24 hr tablet  5. metoprolol succinate (TOPROL-XL) 100 MG 24 hr tablet  6. pantoprazole (PROTONIX) 40 MG tablet  ____ Fleet Enema (as directed)   ____ Use CHG Soap/SAGE wipes as directed  ____ Use inhalers on the day of surgery  ____ Stop metformin/Janumet/Farxiga 2 days prior to surgery    ____ Take 1/2 of usual insulin dose the night before surgery. No insulin the morning          of surgery.   __X__ Stop Blood Thinners Xarelto  hOLD 2 DAYS FOR SURGERY  __X__ Stop Anti-inflammatories 7 days before surgery such as Advil, Ibuprofen, Motrin,  BC or Goodies Powder, Naprosyn, Naproxen, Aleve, Aspirin    __X__ Stop all herbal supplements, fish oil or vitamin E until after surgery.    ____ Bring C-Pap to the hospital.      How to Use an Incentive Spirometer An incentive spirometer is a tool that measures how well you are filling your lungs with each breath. Learning to take long, deep breaths using this tool can help you keep your lungs clear and active. This may help to reverse or lessen your chance of developing breathing (pulmonary) problems, especially infection. You may be asked to use a spirometer: After a surgery. If you  have a lung problem or a history of smoking. After a long period of time when you have been unable to move or be active. If the spirometer includes an indicator to show the highest number that you have reached, your health care provider or respiratory therapist will help you set a goal. Keep a log of your progress as told by your health care provider. What are the risks? Breathing too quickly may cause dizziness or cause you to pass out. Take your time so you do not get dizzy or light-headed. If you are in pain, you may need to take pain medicine before doing incentive spirometry. It is harder to take a deep breath if you are having pain. How to use your incentive spirometer  Sit up on the edge of your bed or on a chair. Hold the incentive spirometer so that it is in an upright position. Before you use the spirometer, breathe out normally. Place the mouthpiece in your mouth. Make sure your lips are closed tightly around it. Breathe in slowly and as deeply as you can through your mouth, causing the piston or the ball to rise toward the top of the chamber. Hold your breath for 3-5 seconds, or for as long as possible. If the spirometer includes a coach indicator, use this to guide you in breathing. Slow down your breathing if the indicator goes above the marked areas. Remove the mouthpiece from your mouth and breathe out normally. The piston or ball will return to the bottom of the chamber. Rest for a few seconds, then repeat the steps 10 or more times. Take your time and take a few normal breaths between deep breaths so that you do not get dizzy or light-headed. Do this every 1-2 hours when you are awake. If the spirometer includes a goal marker to show the highest number you have reached (best effort), use this as a goal to work toward during each repetition. After each set of 10 deep breaths, cough a few times. This will help to make sure that your lungs are  clear. If you have an incision on  your chest or abdomen from surgery, place a pillow or a rolled-up towel firmly against the incision when you cough. This can help to reduce pain while taking deep breaths and coughing. General tips When you are able to get out of bed: Walk around often. Continue to take deep breaths and cough in order to clear your lungs. Keep using the incentive spirometer until your health care provider says it is okay to stop using it. If you have been in the hospital, you may be told to keep using the spirometer at home. Contact a health care provider if: You are having difficulty using the spirometer. You have trouble using the spirometer as often as instructed. Your pain medicine is not giving enough relief for you to use the spirometer as told. You have a fever. Get help right away if: You develop shortness of breath. You develop a cough with bloody mucus from the lungs. You have fluid or blood coming from an incision site after you cough. Summary An incentive spirometer is a tool that can help you learn to take long, deep breaths to keep your lungs clear and active. You may be asked to use a spirometer after a surgery, if you have a lung problem or a history of smoking, or if you have been inactive for a long period of time. Use your incentive spirometer as instructed every 1-2 hours while you are awake. If you have an incision on your chest or abdomen, place a pillow or a rolled-up towel firmly against your incision when you cough. This will help to reduce pain. Get help right away if you have shortness of breath, you cough up bloody mucus, or blood comes from your incision when you cough. This information is not intended to replace advice given to you by your health care provider. Make sure you discuss any questions you have with your health care provider. Document Revised: 03/15/2019 Document Reviewed: 03/15/2019 [employer] Patient Education  [employer].

## 2020-10-13 NOTE — Pre-Procedure Instructions (Signed)
Patient unable to come to office prior to surgery for EKG. Will do day of. He is aware that if anything is abnormal surgery could be cancelled. Instructions given verbal via phone. Patient aware they are available in my chart under AVS and agrees to review them.Ensure drink not given as patient not seen nor coming to office prior to surgery. Will call for any questions or concerns.

## 2020-10-14 ENCOUNTER — Encounter: Payer: Self-pay | Admitting: Surgery

## 2020-10-15 ENCOUNTER — Encounter: Payer: Self-pay | Admitting: Surgery

## 2020-10-15 NOTE — Progress Notes (Signed)
Perioperative Services  Pre-Admission/Anesthesia Testing Clinical Review  Date: 10/16/20  Patient Demographics:  Name: Martin Adkins DOB:   05-18-1945 MRN:   [id number]  Planned Surgical Procedure(s):    Case: [id number] Date/Time: 10/18/20 1135   Procedure: DUPUYTREN CONTRACTURE RELEASE (Right: Hand)   Anesthesia type: Choice   Pre-op diagnosis: Dupuytren's contracture M72.0   Location: [facility] OR ROOM 02 / [place] [facility] FOR ANESTHESIA GROUP   Surgeons: Corky Mull, MD     NOTE: Available PAT nursing documentation and vital signs have been reviewed. Clinical nursing staff has updated patient's PMH/PSHx, current medication list, and drug allergies/intolerances to ensure comprehensive history available to assist in medical decision making as it pertains to the aforementioned surgical procedure and anticipated anesthetic course. Extensive review of available clinical information performed. [place] PMH and PSHx updated with any diagnoses/procedures that  may have been inadvertently omitted during his intake with the pre-admission testing department's nursing staff.  Clinical Discussion:  Martin Adkins is a 75 y.o. male who is submitted for pre-surgical anesthesia review and clearance prior to him undergoing the above procedure. Patient is a Current Smoker (0.2 pack years ). Pertinent PMH includes: CAD (s/p CABG), NSTEMI (2015), STEMI (2016), HFrEF, cardiomyopathy, atrial fibrillation, PVD, AAA (s/p EVAR and RIGHT iliac extension limb), aortic atherosclerosis, valvular regurgitation, DVT, HTN, HLD, prediabetes, OSAH (does NOT use nocturnal PAP therapy), GERD (on daily PPI), Barrett's esophagus, neuroendocrine tumor of the rectum, hiatal hernia, antiphospholipid syndrome, CLL, BPH, OA, RA.  Patient is followed by cardiology Nehemiah Massed, MD). He was last seen in the cardiology clinic on 03/06/2020; notes reviewed.  At the time of his clinic visit, patient doing well overall from a cardiovascular  perspective.  Patient denied any episodes of chest pain, however he complained of chronic shortness of breath related to his ongoing smoking history and underlying cardiovascular disease.  He denied any episodes of PND, orthopnea, significant peripheral edema, vertiginous symptoms, or presyncope/syncope.  Patient with intermittent palpitations.  [name] has a significant cardiovascular history.  Patient with known significant coronary artery disease.  He underwent a diagnostic left heart catheterization on 11/14/1995 that revealed 95% stenoses of the mid LCx and OM1.  Lesions were treated with PTCA/POBA.  Patient underwent a four-vessel CABG in 2004.  LIMA-LAD, SVG-D1, SVG-RCA, and SVG-OM1 bypass grafts were placed.  Patient found to have a infrarenal AAA by ultrasound on 02/09/2004 that measured 3.1 x 3.4 cm.  He underwent serial monitoring of his known aneurysmal defect.  CT of the abdomen pelvis performed on 07/09/2011 revealed enlargement to 1.9 cm.  Vascular surgery was consulted, and patient ultimately underwent EVAR repair.  Subsequent follow-up imaging revealed persistent enlargement of the aneurysmal defect.  Patient was taken back to the OR on 09/24/2017 for placement of a 27 mm x 12 cm RIGHT iliac extension limb down to the distal common iliac artery.  Patient suffered and NSTEMI on 05/02/2013.  Diagnostic left heart catheterization performed on 05/03/2013 revealing significant CAD; 50% LM, 100% LAD, 75% mid LCx, 100% distal LCx, and 60% mid RCA.  The previously placed LIMA-LAD bypass graft was patent, however the SVG-D1 and SVG-PDA grafts were occluded.  SVG-OM graft with extensive thrombus and a distal 99% lesion.  Patient was [facility] to [facility] for high risk PCI.  DES (unknown type) x 3 were placed to 2 distinct areas within the SVG-OM graft with filter protection.  Patient suffered an inferior STEMI on 07/30/2014.  Diagnostic left heart catheterization was performed revealing  multivessel CAD; 50% left main, 90% mid LCx, 100% OM2, and 100% RPDA.  LIMA-LAD bypass graft was patent.  SVG-OM 2, SVG-OM 3, and SVG-PDA bypass grafts chronically occluded.  No intervention was performed opting for medical management.  Repeat diagnostic left heart catheterization was performed on 01/19/2018.  LIMA-LAD bypass graft patent.  All other previously placed grafts were occluded and felt to be not amenable to PCI or further bypass.  Myocardial perfusion imaging study performed on 02/05/2019 revealing a reduced LVEF of 40%.  There was akinesis of the inferior and posterior myocardium.  There was a large perfusion abnormality with severe intensity of the inferior and posterior myocardium associated with previous infarct and/or scar without evidence of myocardial ischemia.  TTE performed on 02/22/2020 revealed a reduced left ventricular systolic function with an EF 35-40%.  There was mild pan valvular regurgitation noted.  Patient has a history of DVT in the past related to his underlying antiphospholipid syndrome and malignancy diagnoses.    Patient with a PMH significant for atrial fibrillation. CHA2DS2-VASc Score = 6 (age, CHF, HTN, prior DVT x 2, prior MI).  Rate and rhythm maintained on daily beta-blocker therapy.  Patient is chronically anticoagulated using daily rivaroxaban; compliant with therapy with no evidence of GI bleeding.  Blood pressure well controlled at 124/68 on currently prescribed CCB, nitrate, ARB, and beta-blocker therapies.  Patient is on a statin for his HLD.  Patient has a prediabetes diagnosis; last Hgb A1c was 6.0% when checked on 07/11/2020.  Patient has an OSAH diagnosis, however he does not utilize nocturnal PAP therapy.  Functional capacity, as defined by DASI, is documented as being >/= 4 METS.  No changes were made to his medication regimen.  Patient to follow-up with outpatient cardiology in around 9 months or sooner if needed.  Martin Adkins is scheduled for  an elective RIGHT DUPUYTREN CONTRACTURE RELEASE on 10/18/2020 with Dr. Milagros Evener, MD.  Given patient's past medical history significant for cardiovascular diagnoses, presurgical cardiac clearance was sought by the PAT team. Per cardiology, "this patient is optimized for surgery and may proceed with the planned procedural course with a LOW risk of significant perioperative cardiovascular complications".  This patient is on daily anticoagulation therapy. He has been instructed on recommendations for holding his daily rivaroxaban dose for 3 days prior to his procedure with plans to restart as soon as postoperative bleeding risk felt to be minimized by his attending surgeon. The patient has been instructed that his last dose of his anticoagulant will be on 10/14/2020.  Patient denies previous perioperative complications with anesthesia in the past. In review of the available records, it is noted that patient underwent a MAC anesthetic course at [facility] ([facility] III) in 10/2020 without documented complications.   Vitals with BMI 10/13/2020 10/09/2020 10/09/2020  Height _0  - -  Weight 208 lbs - -  BMI 41.3 - -  Systolic - 244 010  Diastolic - 77 272  Pulse - 64 60    Providers/Specialists:   NOTE: Primary physician provider listed below. Patient may have been seen by APP or partner within same practice.   PROVIDER ROLE / SPECIALTY LAST OV  Poggi, Marshall Cork, MD ORTHOPEDICS (SURGEON) 09/26/2020  [name], MD PRIMARY CARE PROVIDER 10/12/2008  Serafina Royals, MD CARDIOLOGY 02/27/2020  Jennings Books, MD NEUROLOGY 08/21/2020  Earlie Server, MD ONCOLOGY 08/22/2020  Leotis Pain, MD VASCULAR SURGERY 09/26/2020   Allergies:  Ace inhibitors, Amoxicillin, Niacin, Penicillins, Pravastatin, Rosuvastatin, and Simvastatin  Current Home Medications:   No current facility-administered medications for this encounter.    amLODipine (NORVASC) 10 MG tablet   atorvastatin (LIPITOR) 80 MG tablet    Cholecalciferol (VITAMIN D3) 50 MCG (2000 UT) CAPS   dorzolamide (TRUSOPT) 2 % ophthalmic solution   fluticasone (FLONASE) 50 MCG/ACT nasal spray   gabapentin (NEURONTIN) 300 MG capsule   isosorbide mononitrate (IMDUR) 60 MG 24 hr tablet   latanoprost (XALATAN) 0.005 % ophthalmic solution   losartan (COZAAR) 25 MG tablet   metoprolol succinate (TOPROL-XL) 100 MG 24 hr tablet   Multiple Vitamins-Minerals (MULTIVITAMIN MEN 50+ PO)   pantoprazole (PROTONIX) 40 MG tablet   potassium chloride SA (K-DUR,KLOR-CON) 20 MEQ tablet   rivaroxaban (XARELTO) 20 MG TABS tablet   History:   Past Medical History:  Diagnosis Date   AAA (abdominal aortic aneurysm) without rupture    a.) [place] 02/09/04 - 3.1 x 3.4 cm. b.) [place] 02/14/05 - 3.62 x 3.63 cm. c.) [place] 02/25/06 - 3.97 x 4.0 cm. d.) CT 07/09/11 - 4.9 cm. e.) s/p EVAR 2013. f.) enlarging AAA --> back to the OR on 09/24/2017 for placement of a 27 mm x 12 cm RIGHT iliac extension limb down to distal CIA.   Aortic atherosclerosis (HCC)    APS (antiphospholipid syndrome) (HCC)    Arthritis    Atrial fibrillation (HCC)    a.) CHA2DS2-VASc Score = 6 (age, CHF, HTN, prior DVT x 2, prior MI). b.) on rivaroxaban   B12 deficiency 03/21/2017   Barrett's esophagus    BPH (benign prostatic hyperplasia)    CAD (coronary artery disease)    a.) LHC 11/14/95 --> 95% mLCx, 95% OM1; PTCA. b.) 4v CABG 2004 --> LIMA-LAD, SVG-D1,RCA,OM. c.) NSTEMI 05/02/2013 --> PCI with DES x 3 to SVG-OM in 2 distinct areas with filter. d.) Inf STEMI 07/30/14 - LHC --> 50% LM, 90% mLCx, 100% OM2, 100% RPDA; LIMA-LAD patent; 100% occ of SVG OM2,OM3,PDA; med mgmt. e.) LHC 01/19/18 - patent LIMA-LAD; other grafts occ; not amenable to PCI/further bypass.   Cardiomyopathy ([place])    Chronic anticoagulation    a.) Rivaroxaban   CLL (chronic lymphocytic leukemia) ([place]) 05/24/2017   DVT (deep venous thrombosis) (HCC)    GERD (gastroesophageal reflux disease)    HFrEF (heart failure with reduced  ejection fraction) ([place])    a.) TTE 02/22/2020 --> mod. LV systolic dysfunction; LVEF 35-40%.   History of hiatal hernia    History of shingles 2013   Hyperlipemia    Hyperplastic polyps of stomach    a.) Bx on 07/24/2020 --> well differentiated NET; WHO grade I.   Hypertension    NSTEMI (non-ST elevated myocardial infarction) ([place]) 05/02/2013   a.) LHC 05/03/2013 --> 50% LM, 100% LAD, 75% mLCx, 100% dLCx, 60% mRCA; patient LIMA-LAD, occluded SVG-D1 and SVG-PDA. SVG-OM with extensive thrombus and distal 99% lesion. HIGH RISK PCI performed at [facility] with placement of DES x 3 to 2 distinct areas of SVG with filter protection.   OSA (obstructive sleep apnea)    a.) does NOT use nocturnal PAP therapy   Osteoarthritis    Pneumonia    Pre-diabetes    Primary neuroendocrine carcinoma of rectum ([place]) 07/24/2020   a.) Bx (+) for well differentiated NET; WHO grade I.  b.) Stage I (cT1, cN0, cM0)   PVD (peripheral vascular disease) (HCC)    RA (rheumatoid arthritis) ([place])    S/P CABG x 4 01/21/2002   a.) 4v; LIMA-LAD, SVG-D1, SVG-RCA, SVG-OM  SOB (shortness of breath)    ST elevation myocardial infarction (STEMI) of inferior wall ([place]) 07/30/2014   a.) LHC --> 50% LM, 90% mLCx, 100% OM2, 100% RPDA; LIMA-LAD patent; 100% occlusions of SVG OM2, OM3, PDA. No intervention; medical management.   Valvular regurgitation    a.) TTE 02/22/2020 --> LVEF 35-40%; mild panvalvular.   Past Surgical History:  Procedure Laterality Date   ABDOMINAL AORTA STENT     CHOLECYSTECTOMY     COLONOSCOPY  11/24/2009   COLONOSCOPY N/A 10/09/2020   Procedure: COLONOSCOPY;  Surgeon: Mansouraty, Telford Nab., MD;  Location: [facility] ENDOSCOPY;  Service: Gastroenterology;  Laterality: N/A;   COLONOSCOPY WITH PROPOFOL N/A 07/24/2020   Procedure: COLONOSCOPY WITH PROPOFOL;  Surgeon: Toledo, Benay Pike, MD;  Location: [facility] ENDOSCOPY;  Service: Gastroenterology;  Laterality: N/A;   CORONARY ANGIOPLASTY WITH STENT PLACEMENT Left  05/03/2013   Procedure: CORONARY ANGIOPLASTY WITH STENT PLACEMENT (DES x 3 to SVG-OM graft); Location: [facility]   CORONARY ARTERY BYPASS GRAFT N/A 01/21/2002   Procedure: 4V CABG (LIMA-LAD, SVG-D1, SVG-RCA, SVG-OM); Location: [facility]   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   Procedure: DUPUYTREN CONTRACTURE RELEASE LEFT LONG FINGER;  Surgeon: Corky Mull, MD;  Location: [facility] ORS;  Service: Orthopedics;  Laterality: Left;   ENDOVASCULAR REPAIR/STENT GRAFT N/A 09/24/2017   Procedure: ENDOVASCULAR REPAIR/STENT GRAFT (27 mm x 12 cm RIGHT iliac limb extension to distal CIA);  Surgeon: Algernon Huxley, MD;  Location: [place] CV LAB;  Service: Cardiovascular;  Laterality: N/A;   ESOPHAGOGASTRODUODENOSCOPY  05/26/02  03/23/12   ESOPHAGOGASTRODUODENOSCOPY (EGD) WITH PROPOFOL N/A 10/28/2016   Procedure: ESOPHAGOGASTRODUODENOSCOPY (EGD) WITH PROPOFOL;  Surgeon: Manya Silvas, MD;  Location: [facility] ENDOSCOPY;  Service: Endoscopy;  Laterality: N/A;   EUS N/A 10/09/2020   Procedure: LOWER ENDOSCOPIC ULTRASOUND (EUS);  Surgeon: Irving Copas., MD;  Location: [facility];  Service: Gastroenterology;  Laterality: N/A;   INCISION AND DRAINAGE ABSCESS N/A 06/14/2020   Procedure: INCISION AND DRAINAGE ABSCESS;  Surgeon: Herbert Pun, MD;  Location: [facility] ORS;  Service: General;  Laterality: N/A;   INGUINAL HERNIA REPAIR Left    LEFT HEART CATH AND CORONARY ANGIOGRAPHY N/A 01/19/2018   Procedure: LEFT HEART CATH AND CORONARY ANGIOGRAPHY;  Surgeon: Teodoro Spray, MD;  Location: [place] [facility] LAB;  Service: Cardiovascular;  Laterality: N/A;   LEFT HEART CATH AND CORONARY ANGIOGRAPHY Left 11/14/1995   Procedure: CARDIAC CATHETERIZATION (PTCA); Location: [facility]; Surgeon: Doristine Bosworth, MD   LEFT HEART CATH AND CORS/GRAFTS ANGIOGRAPHY Left 07/30/2014   Procedure: CARDIAC CATHETERIZATION; Location: [facility]   PERIPHERAL VASCULAR CATHETERIZATION N/A 09/06/2014   Procedure: IVC Filter Removal;   Surgeon: Katha Cabal, MD;  Location: [place] CV LAB;  Service: Cardiovascular;  Laterality: N/A;   POLYPECTOMY  10/09/2020   Procedure: POLYPECTOMY;  Surgeon: Rush Landmark Telford Nab., MD;  Location: [facility];  Service: Gastroenterology;;   SUBMUCOSAL LIFTING INJECTION  10/09/2020   Procedure: SUBMUCOSAL LIFTING INJECTION;  Surgeon: Irving Copas., MD;  Location: [facility];  Service: Gastroenterology;;   TRANSURETHRAL RESECTION OF PROSTATE N/A 02/20/2015   Procedure: TRANSURETHRAL RESECTION OF THE PROSTATE (TURP);  Surgeon: Hollice Espy, MD;  Location: [facility] ORS;  Service: Urology;  Laterality: N/A;   Family History  Problem Relation Age of Onset   Cancer Mother 45       BREAST   Diabetes Mother    Coronary artery disease Father    Heart attack Father    Prostate cancer Brother    Bladder Cancer Neg  Hx    Kidney cancer Neg Hx    Social History   Tobacco Use   Smoking status: Some Days    Packs/day: 0.01    Years: 20.00    Pack years: 0.20    Types: Cigarettes   Smokeless tobacco: Never   Tobacco comments:    sometimes 2-3 a day  Vaping Use   Vaping Use: Never used  Substance Use Topics   Alcohol use: Yes    Alcohol/week: 1.0 - 2.0 standard drink    Types: 1 - 2 Shots of liquor per week    Comment: casual   Drug use: No    Pertinent Clinical Results:  LABS: Labs reviewed: Acceptable for surgery.  Lab Results  Component Value Date   WBC 6.3 08/22/2020   HGB 14.3 08/22/2020   HCT 43.3 08/22/2020   MCV 88.9 08/22/2020   PLT 134 (L) 08/22/2020   Lab Results  Component Value Date   NA 139 08/22/2020   K 3.6 08/22/2020   CO2 25 08/22/2020   GLUCOSE 122 (H) 08/22/2020   BUN 14 08/22/2020   CREATININE 0.84 08/22/2020   CALCIUM 9.2 08/22/2020   GFRNONAA >60 08/22/2020    ECG: Date: 01/20/2020 Rate: 86 bpm Rhythm: normal sinus Axis (leads I and aVF): Normal Intervals: PR 142 ms. QRS 78 ms. QTc 461 ms. ST segment and T wave changes:  Nonspecific ST abnormalities Comparison: Similar to previous tracing obtained on 10/22/2019 NOTE: Tracing obtained at [facility]; unable for review. Above based on cardiologist's interpretation. Patient REFUSED to come in for repeat as requested prior to this procedure. It was explained to him that his procedure could be cancelled if there were any abnormalities noted. Stressed that with his heart history, having a preoperative ECG was imperative to ensuring his safety during the perioperative period. Patient states, "I understand and will take my chances. If there is something wrong with it on the day of surgery then I was not meant to have surgery then anyway".   IMAGING / PROCEDURES: TRANSTHORACIC ECHOCARDIOGRAM performed on 02/22/2020 LVEF 35-40% Moderate ventricular systolic dysfunction Normal right ventricular systolic function Mild pan valvular regurgitation No valvular stenosis No evidence of pericardial effusion  LEXISCAN performed on 02/05/2019 LVEF 40% Akinesis of the inferior and posterior myocardium No artifacts noted Left ventricular cavity size normal Large perfusion abnormality with severe intensity of the inferior and posterior myocardium consistent with previous infarct and/or scar without evidence of myocardial ischemia  LEFT HEART CATHETERIZATION AND CORONARY ANGIOGRAPHY performed on 01/19/2018 LIMA-LAD bypass graft patent.   All other previously placed grafts were occluded and felt to be not amenable to PCI or further bypass.  LEFT HEART CATHETERIZATION AND CORONARY ANGIOGRAPHY performed on 08/02/2014 Inferior STEMI Multivessel CAD 50% left main 90% mid LCx 100% OM2 100% RPDA.   LIMA-LAD bypass graft was patent.   SVG-OM 2, SVG-OM 3, and SVG-PDA bypass grafts chronically occluded.   No intervention was performed opting for medical management.  LEFT HEART CATHETERIZATION AND CORONARY ANGIOGRAPHY performed on 05/03/2013 NSTEMI Significant multivessel   CAD; 50% LM 100% LAD 75% mid LCx 100% distal LCx 60% mid RCA   The previously placed LIMA-LAD bypass graft was patent SVG-D1 and SVG-PDA grafts were occluded.   SVG-OM graft with extensive thrombus and a distal 99% lesion   Patient was transferred from [facility] to [facility] for high risk PCI   DES (unknown type) x 3 were placed to 2 distinct areas within the SVG-OM graft with filter protection.  CORONARY ARTERY BYPASS GRAFTING performed on 01/21/2002 4v CABG procedure LIMA-LAD SVG-D1 SVG-RCA SVG-OM  LEFT HEART CATHETERIZATION AND CORONARY ANGIOGRAPHY performed on 11/14/1995 2 vessel CAD 95% mLCx 95% OM1 Successful PTCA to mLCx and OM1  Impression and Plan:  Martin Adkins has been referred for pre-anesthesia review and clearance prior to him undergoing the planned anesthetic and procedural courses. Available labs, pertinent testing, and imaging results were personally reviewed by me. This patient has been appropriately cleared by cardiology with an overall LOW risk of significant perioperative cardiovascular complications.  Based on clinical review performed today (10/16/20), barring any significant acute changes in the patient's overall condition, it is anticipated that he will be able to proceed with the planned surgical intervention. Any acute changes in clinical condition may necessitate his procedure being postponed and/or cancelled. Patient will meet with anesthesia team (MD and/or CRNA) on the day of his procedure for preoperative evaluation/assessment. Questions regarding anesthetic course will be fielded at that time.   Pre-surgical instructions were reviewed with the patient during his PAT appointment and questions were fielded by PAT clinical staff. Patient was advised that if any questions or concerns arise prior to his procedure then he should return a call to PAT and/or his surgeon's office to discuss.  Honor Loh, MSN, APRN, FNP-C, CEN [facility]   Peri-operative Services Nurse Practitioner Phone: [phone] Fax: [phone] 10/16/20 2:24 PM  NOTE: This note has been prepared using Dragon dictation software. Despite my best ability to proofread, there is always the potential that unintentional transcriptional errors may still occur from this process.

## 2020-10-16 ENCOUNTER — Encounter: Payer: Self-pay | Admitting: Surgery

## 2020-10-17 MED ORDER — CHLORHEXIDINE GLUCONATE 0.12 % MT SOLN: 15.0000 mL | Freq: Once | OROMUCOSAL | Status: AC

## 2020-10-17 MED ORDER — LACTATED RINGERS IV SOLN: INTRAVENOUS | Status: DC

## 2020-10-17 MED ORDER — ORAL CARE MOUTH RINSE: 15.0000 mL | Freq: Once | OROMUCOSAL | Status: AC

## 2020-10-17 MED ORDER — CLINDAMYCIN PHOSPHATE 900 MG/50ML IV SOLN
900.0000 mg | INTRAVENOUS | Status: AC
  Administered 2020-10-18: 900 mg via INTRAVENOUS

## 2020-10-18 ENCOUNTER — Ambulatory Visit: Payer: Medicare PPO | Admitting: Urgent Care

## 2020-10-18 ENCOUNTER — Ambulatory Visit
Admission: RE | Admit: 2020-10-18 | Discharge: 2020-10-18 | Disposition: A | Discharge status: Home / Self Care | Payer: Medicare PPO | Attending: Surgery | Admitting: Surgery

## 2020-10-18 ENCOUNTER — Other Ambulatory Visit: Payer: Self-pay

## 2020-10-18 ENCOUNTER — Encounter
Admission: RE | Disposition: A | Discharge status: Home / Self Care | Payer: Self-pay | Source: Home / Self Care | Attending: Surgery

## 2020-10-18 ENCOUNTER — Encounter: Payer: Self-pay | Admitting: Surgery

## 2020-10-18 DIAGNOSIS — M720 Palmar fascial fibromatosis [Dupuytren]: Secondary | ICD-10-CM | POA: Diagnosis not present

## 2020-10-18 DIAGNOSIS — I4891 Unspecified atrial fibrillation: Secondary | ICD-10-CM | POA: Insufficient documentation

## 2020-10-18 DIAGNOSIS — F17200 Nicotine dependence, unspecified, uncomplicated: Secondary | ICD-10-CM | POA: Insufficient documentation

## 2020-10-18 DIAGNOSIS — Z8249 Family history of ischemic heart disease and other diseases of the circulatory system: Secondary | ICD-10-CM | POA: Insufficient documentation

## 2020-10-18 DIAGNOSIS — C9110 Chronic lymphocytic leukemia of B-cell type not having achieved remission: Secondary | ICD-10-CM | POA: Insufficient documentation

## 2020-10-18 DIAGNOSIS — Z880 Allergy status to penicillin: Secondary | ICD-10-CM | POA: Diagnosis not present

## 2020-10-18 DIAGNOSIS — Z86718 Personal history of other venous thrombosis and embolism: Secondary | ICD-10-CM | POA: Insufficient documentation

## 2020-10-18 DIAGNOSIS — Z955 Presence of coronary angioplasty implant and graft: Secondary | ICD-10-CM | POA: Insufficient documentation

## 2020-10-18 DIAGNOSIS — Z79899 Other long term (current) drug therapy: Secondary | ICD-10-CM | POA: Insufficient documentation

## 2020-10-18 DIAGNOSIS — E1151 Type 2 diabetes mellitus with diabetic peripheral angiopathy without gangrene: Secondary | ICD-10-CM | POA: Diagnosis not present

## 2020-10-18 DIAGNOSIS — Z951 Presence of aortocoronary bypass graft: Secondary | ICD-10-CM | POA: Insufficient documentation

## 2020-10-18 DIAGNOSIS — Z7901 Long term (current) use of anticoagulants: Secondary | ICD-10-CM | POA: Insufficient documentation

## 2020-10-18 DIAGNOSIS — Z881 Allergy status to other antibiotic agents status: Secondary | ICD-10-CM | POA: Diagnosis not present

## 2020-10-18 DIAGNOSIS — Z833 Family history of diabetes mellitus: Secondary | ICD-10-CM | POA: Insufficient documentation

## 2020-10-18 DIAGNOSIS — Z809 Family history of malignant neoplasm, unspecified: Secondary | ICD-10-CM | POA: Diagnosis not present

## 2020-10-18 DIAGNOSIS — Z888 Allergy status to other drugs, medicaments and biological substances status: Secondary | ICD-10-CM | POA: Insufficient documentation

## 2020-10-18 HISTORY — DX: Cardiomyopathy, unspecified: I42.9

## 2020-10-18 HISTORY — PX: DUPUYTREN CONTRACTURE RELEASE: SHX1478

## 2020-10-18 HISTORY — DX: Unspecified systolic (congestive) heart failure: I50.20

## 2020-10-18 HISTORY — DX: Endocarditis, valve unspecified: I38

## 2020-10-18 HISTORY — DX: Atherosclerosis of aorta: I70.0

## 2020-10-18 HISTORY — DX: Unspecified atrial fibrillation: I48.91

## 2020-10-18 HISTORY — DX: Long term (current) use of anticoagulants: Z79.01

## 2020-10-18 SURGERY — RELEASE, DUPUYTREN CONTRACTURE
Anesthesia: General | Site: Hand | Laterality: Right

## 2020-10-18 MED ORDER — CLINDAMYCIN PHOSPHATE 900 MG/50ML IV SOLN
INTRAVENOUS | Status: AC
  Filled 2020-10-18: qty 50

## 2020-10-18 MED ORDER — DEXAMETHASONE SODIUM PHOSPHATE 10 MG/ML IJ SOLN
INTRAMUSCULAR | Status: DC | PRN
  Administered 2020-10-18: 10 mg via INTRAVENOUS

## 2020-10-18 MED ORDER — FENTANYL CITRATE (PF) 100 MCG/2ML IJ SOLN
INTRAMUSCULAR | Status: AC
  Filled 2020-10-18: qty 2

## 2020-10-18 MED ORDER — GLYCOPYRROLATE 0.2 MG/ML IJ SOLN
INTRAMUSCULAR | Status: DC | PRN
  Administered 2020-10-18: .2 mg via INTRAVENOUS

## 2020-10-18 MED ORDER — ACETAMINOPHEN 10 MG/ML IV SOLN
INTRAVENOUS | Status: AC
  Filled 2020-10-18: qty 100

## 2020-10-18 MED ORDER — HYDROCODONE-ACETAMINOPHEN 5-325 MG PO TABS: 1.0000 | ORAL_TABLET | Freq: Four times a day (QID) | ORAL | 0 refills | Status: DC | PRN

## 2020-10-18 MED ORDER — ONDANSETRON HCL 4 MG/2ML IJ SOLN
INTRAMUSCULAR | Status: DC | PRN
  Administered 2020-10-18: 4 mg via INTRAVENOUS

## 2020-10-18 MED ORDER — BUPIVACAINE HCL (PF) 0.5 % IJ SOLN
INTRAMUSCULAR | Status: DC | PRN
  Administered 2020-10-18: 20 mL

## 2020-10-18 MED ORDER — FENTANYL CITRATE (PF) 100 MCG/2ML IJ SOLN
INTRAMUSCULAR | Status: DC | PRN
  Administered 2020-10-18 (×4): 25 ug via INTRAVENOUS

## 2020-10-18 MED ORDER — BUPIVACAINE HCL (PF) 0.5 % IJ SOLN
INTRAMUSCULAR | Status: AC
  Filled 2020-10-18: qty 30

## 2020-10-18 MED ORDER — LACTATED RINGERS IV SOLN: INTRAVENOUS | Status: DC | PRN

## 2020-10-18 MED ORDER — HYDROCODONE-ACETAMINOPHEN 5-325 MG PO TABS
1.0000 | ORAL_TABLET | ORAL | Status: DC | PRN
  Administered 2020-10-18: 1 via ORAL

## 2020-10-18 MED ORDER — ACETAMINOPHEN 10 MG/ML IV SOLN
INTRAVENOUS | Status: DC | PRN
  Administered 2020-10-18: 1000 mg via INTRAVENOUS

## 2020-10-18 MED ORDER — ONDANSETRON HCL 4 MG PO TABS: 4.0000 mg | ORAL_TABLET | Freq: Four times a day (QID) | ORAL | Status: DC | PRN

## 2020-10-18 MED ORDER — 0.9 % SODIUM CHLORIDE (POUR BTL) OPTIME
TOPICAL | Status: DC | PRN
  Administered 2020-10-18: 500 mL

## 2020-10-18 MED ORDER — MIDAZOLAM HCL 2 MG/2ML IJ SOLN
INTRAMUSCULAR | Status: AC
  Filled 2020-10-18: qty 2

## 2020-10-18 MED ORDER — ONDANSETRON HCL 4 MG/2ML IJ SOLN: 4.0000 mg | Freq: Four times a day (QID) | INTRAMUSCULAR | Status: DC | PRN

## 2020-10-18 MED ORDER — METOCLOPRAMIDE HCL 5 MG/ML IJ SOLN: 5.0000 mg | Freq: Three times a day (TID) | INTRAMUSCULAR | Status: DC | PRN

## 2020-10-18 MED ORDER — FENTANYL CITRATE (PF) 100 MCG/2ML IJ SOLN: 25.0000 ug | INTRAMUSCULAR | Status: DC | PRN

## 2020-10-18 MED ORDER — LIDOCAINE HCL (CARDIAC) PF 100 MG/5ML IV SOSY
PREFILLED_SYRINGE | INTRAVENOUS | Status: DC | PRN
  Administered 2020-10-18: 100 mg via INTRAVENOUS

## 2020-10-18 MED ORDER — PHENYLEPHRINE HCL (PRESSORS) 10 MG/ML IV SOLN
INTRAVENOUS | Status: DC | PRN
  Administered 2020-10-18: 100 ug via INTRAVENOUS
  Administered 2020-10-18 (×2): 200 ug via INTRAVENOUS

## 2020-10-18 MED ORDER — SODIUM CHLORIDE 0.9 % IV SOLN: INTRAVENOUS | Status: DC

## 2020-10-18 MED ORDER — PHENYLEPHRINE HCL (PRESSORS) 10 MG/ML IV SOLN
INTRAVENOUS | Status: AC
  Filled 2020-10-18: qty 1

## 2020-10-18 MED ORDER — PROPOFOL 10 MG/ML IV BOLUS
INTRAVENOUS | Status: DC | PRN
  Administered 2020-10-18: 120 mg via INTRAVENOUS

## 2020-10-18 MED ORDER — ONDANSETRON HCL 4 MG/2ML IJ SOLN: 4.0000 mg | Freq: Once | INTRAMUSCULAR | Status: DC | PRN

## 2020-10-18 MED ORDER — METOCLOPRAMIDE HCL 10 MG PO TABS: 5.0000 mg | ORAL_TABLET | Freq: Three times a day (TID) | ORAL | Status: DC | PRN

## 2020-10-18 MED ORDER — HYDROCODONE-ACETAMINOPHEN 5-325 MG PO TABS
ORAL_TABLET | ORAL | Status: AC
  Filled 2020-10-18: qty 1

## 2020-10-18 MED ORDER — CHLORHEXIDINE GLUCONATE 0.12 % MT SOLN
OROMUCOSAL | Status: AC
  Administered 2020-10-18: 15 mL via OROMUCOSAL
  Filled 2020-10-18: qty 15

## 2020-10-18 SURGICAL SUPPLY — 36 items
BNDG COHESIVE 4X5 TAN ST LF (GAUZE/BANDAGES/DRESSINGS) ×2 IMPLANT
BNDG CONFORM 2 STRL LF (GAUZE/BANDAGES/DRESSINGS) ×2 IMPLANT
BNDG ELASTIC 2X5.8 VLCR STR LF (GAUZE/BANDAGES/DRESSINGS) ×2 IMPLANT
BNDG ELASTIC 3X5.8 VLCR STR LF (GAUZE/BANDAGES/DRESSINGS) ×2 IMPLANT
BNDG ESMARK 4X12 TAN STRL LF (GAUZE/BANDAGES/DRESSINGS) ×2 IMPLANT
CAST PADDING 3X4FT ST 30246 (SOFTGOODS) ×2
CHLORAPREP W/TINT 26 (MISCELLANEOUS) ×2 IMPLANT
CORD BIP STRL DISP 12FT (MISCELLANEOUS) ×2 IMPLANT
CUFF TOURN SGL QUICK 18X4 (TOURNIQUET CUFF) IMPLANT
DRAPE SURG 17X11 SM STRL (DRAPES) ×2 IMPLANT
ELECT REM PT RETURN 9FT ADLT (ELECTROSURGICAL) ×2
ELECTRODE REM PT RTRN 9FT ADLT (ELECTROSURGICAL) ×1 IMPLANT
FORCEPS JEWEL BIP 4-3/4 STR (INSTRUMENTS) ×2 IMPLANT
GAUZE 4X4 16PLY ~~LOC~~+RFID DBL (SPONGE) ×2 IMPLANT
GAUZE SPONGE 4X4 12PLY STRL (GAUZE/BANDAGES/DRESSINGS) ×2 IMPLANT
GAUZE XEROFORM 1X8 LF (GAUZE/BANDAGES/DRESSINGS) ×2 IMPLANT
GLOVE SURG ENC MOIS LTX SZ8 (GLOVE) ×4 IMPLANT
GLOVE SURG UNDER LTX SZ8 (GLOVE) ×2 IMPLANT
GOWN STRL REUS W/ TWL LRG LVL3 (GOWN DISPOSABLE) ×1 IMPLANT
GOWN STRL REUS W/ TWL XL LVL3 (GOWN DISPOSABLE) ×1 IMPLANT
GOWN STRL REUS W/TWL LRG LVL3 (GOWN DISPOSABLE) ×1
GOWN STRL REUS W/TWL XL LVL3 (GOWN DISPOSABLE) ×1
KIT TURNOVER KIT A (KITS) ×2 IMPLANT
MANIFOLD NEPTUNE II (INSTRUMENTS) ×2 IMPLANT
NS IRRIG 1000ML POUR BTL (IV SOLUTION) IMPLANT
NS IRRIG 500ML POUR BTL (IV SOLUTION) ×2 IMPLANT
PACK EXTREMITY ARMC (MISCELLANEOUS) ×2 IMPLANT
PAD CAST CTTN 3X4 STRL (SOFTGOODS) ×2 IMPLANT
PAD PREP 24X41 OB/GYN DISP (PERSONAL CARE ITEMS) ×2 IMPLANT
SPLINT CAST 1 STEP 4X15 (MISCELLANEOUS) ×2 IMPLANT
SPONGE GAUZE 2X2 8PLY STRL LF (GAUZE/BANDAGES/DRESSINGS) ×2 IMPLANT
STOCKINETTE IMPERVIOUS 9X36 MD (GAUZE/BANDAGES/DRESSINGS) ×2 IMPLANT
SUT PROLENE 4 0 PS 2 18 (SUTURE) ×8 IMPLANT
SUT VIC AB 3-0 SH 27 (SUTURE) ×1
SUT VIC AB 3-0 SH 27X BRD (SUTURE) ×1 IMPLANT
WATER STERILE IRR 500ML POUR (IV SOLUTION) IMPLANT

## 2020-10-18 NOTE — H&P (Signed)
History of Present Illness: Martin Adkins is a 75 y.o. male who presents today to discuss ongoing right hand pain and decreased range of motion. 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 The patient underwent a Dupuytren's contracture excision with excellent results. The patient states that he can nearly fully extend the hand and is able to make a full composite fist. Patient denies any issues with the left hand however he is reporting an occasional catching and locking symptoms in the left middle finger which has been ongoing for the past several weeks. He denies any numbness or tingling to the left upper extremity. The patient presents today primarily to discuss his underlying Dupuytren's contracture in the right fifth digit. He reports that it has been ongoing for several years if not longer and he is ready to undergo an excision of the right hand Dupuytren's contracture. The patient denies any changes in his medical history since he was last evaluated. He does take Xarelto does have a history of A. fib and a AAA. He denies any history of asthma or COPD. He does report a remote history of blood clot.  Past Medical History:  AAA (abdominal aortic aneurysm) (CMS-HCC)   Atrial fibrillation (CMS-HCC)   Barrett's esophagus   Coronary artery disease   Diabetes mellitus type 2, uncomplicated (CMS-HCC)   DVT (deep venous thrombosis) (CMS-HCC)   GERD (gastroesophageal reflux disease)   Hiatal hernia   Hypercholesterolemia   Hyperplastic polyps of stomach   Hypertension   Shingles   Thrombocytopenia (CMS-HCC)   Tobacco abuse   Past Surgical History:  ABDOMINAL AORTIC ANEURYSM REPAIR 07/2011   ANGIOPLASTY / STENTING ILIAC   CARDIAC CATHETERIZATION   CHOLECYSTECTOMY   COLONOSCOPY 11/24/2009, 06/28/2004, 08/28/1998 (Hyperplastic Polyps: [facility] 11/2019)   COLONOSCOPY 07/24/2020 (Well-differentiated neuroendocrine tumor/Tubular adenoma/HP/Repeat 6  months/TKT)   CORONARY ANGIOPLASTY   CORONARY ANGIOPLASTY WITH STENT PLACEMENT   CORONARY ARTERY BYPASS GRAFT   EGD 05/26/2002 - Normal   EGD 03/23/2012, 11/24/2009 (Barrett's Esophagus: CBF 03/2015)   EGD 10/28/2016 (No Barrett's Seen: [facility]BF 10/2021)   PROSTATE SURGERY   Release of Dupuytren's contracture, left long finger 12/09/2018 (Dr. Roland Rack)   UPPER GASTROINTESTINAL ENDOSCOPY   Past Family History:  Diabetes type II Mother   Cancer Mother   Myocardial Infarction (Heart attack) Father   Medications:  amLODIPine (NORVASC) 10 MG tablet TAKE 1 TABLET EVERY DAY 90 tablet 1   atorvastatin (LIPITOR) 80 MG tablet TAKE 1 TABLET EVERY DAY 90 tablet 3   cholecalciferol (CHOLECALCIFEROL) 1000 unit tablet Take 2,000 Units by mouth once daily   dorzolamide (TRUSOPT) 2 % ophthalmic solution INSTILL ONE DROP IN EACH EYE TWICE DAILY   fluticasone propionate (FLONASE) 50 mcg/actuation nasal spray Place 2 sprays into both nostrils once daily (Patient taking differently: Place 2 sprays into both nostrils once daily as needed) 48 g 3   gabapentin (NEURONTIN) 300 MG capsule TAKE 2 CAPSULES TWICE DAILY (AM AND PM) AND TAKE 1 CAPSULE MIDDAY 180 capsule 1   isosorbide mononitrate (IMDUR) 60 MG ER tablet TAKE 1 TABLET EVERY DAY 90 tablet 1   latanoprost (XALATAN) 0.005 % ophthalmic solution INSTILL ONE DROP IN EACH EYE AT BEDTIME   losartan (COZAAR) 25 MG tablet Take 1 tablet (25 mg total) by mouth once daily 90 tablet 3   metoprolol succinate (TOPROL-XL) 100 MG XL tablet TAKE 1 TABLET EVERY DAY 90 tablet 1   pantoprazole (PROTONIX) 40 MG DR tablet TAKE 1 TABLET  EVERY DAY 90 tablet 1   potassium chloride (KLOR-CON) 20 MEQ ER tablet TAKE 1 TABLET EVERY DAY 90 tablet 1   XARELTO 20 mg tablet TAKE 1 TABLET EVERY DAY 90 tablet 1   Allergies:  Ace Inhibitors Cough   Crestor [Rosuvastatin] Other (GI bleed)   Niacin Unknown   Penicillins Rash   Pravastatin Muscle Pain   Zocor [Simvastatin] Muscle Pain    Amoxicillin Rash   Review of Systems:  A comprehensive 14 point ROS was performed, reviewed by me today, and the pertinent orthopaedic findings are documented in the HPI.  Physical Exam: BP 122/82  Ht 185.4 cm (6\' 1" )  Wt 94.7 kg (208 lb 12.8 oz)  BMI 27.55 kg/m  General/Constitutional: The patient appears to be well-nourished, well-developed, and in no acute distress. Neuro/Psych: Normal mood and affect, oriented to person, place and time. Eyes: Non-icteric. Pupils are equal, round, and reactive to light, and exhibit synchronous movement. ENT: Unremarkable. Lymphatic: No palpable adenopathy. Respiratory: Lungs clear to auscultation, Normal chest excursion, No wheezes and Non-labored breathing Cardiovascular: Regular rate and rhythm. No murmurs. and No edema, swelling or tenderness, except as noted in detailed exam. Integumentary: No impressive skin lesions present, except as noted in detailed exam. Musculoskeletal: Unremarkable, except as noted in detailed exam.  General: Well developed, well nourished 75 y.o. male in no apparent distress. Normal affect. Normal communication. Patient answers questions appropriately. The patient has a normal gait. There is no antalgic component. There is no hip lurch.   Right Upper Extremity: Skin examination of the right hand reveals a moderate Dupuytren's contracture of the fifth digit with the MCP joint contracture measuring 80 degrees. A very palpable cord can be palpated indicative of underlying Dupuytren's contracture. The patient is nontender to palpation throughout the right hand. The patient had a negative Finkelstein test. The patient had no snuffbox tenderness. He is unable to make a full composite fist with the right hand. There was no angulation or rotation of the digits.   Neurologic: The patient had sensation that was intact to light touch. The patient had full motor strength. There was no tremor or clonus noted.   Vascular: The patient  had good skin warmth. The radial and ulnar pulse were normal and intact. The patient had less than 2 second capillary refill.   Imaging: None.  Impression: Dupuytren's contracture, right hand.  Plan:  1. Treatment options were discussed today with the patient. 2. The patient does have moderate Dupuytren's contracture of the right fifth digit. The patient is quite frustrated by his continued right hand symptoms and would like to proceed with more aggressive treatment options. 3. After a discussion of the risk and benefits of surgery the patient would like to proceed with a excision of right hand Dupuytren's contracture to be performed by Dr. Roland Rack. 4. This document will serve as a history and physical for the patient. 5. The patient will follow-up per standard postop protocol. They can call the clinic they have any questions, new symptoms develop or symptoms worsen.  The procedure was discussed with the patient, as were the potential risks (including bleeding, infection, nerve and/or blood vessel injury, persistent or recurrent pain, failure of the excision, progression of arthritis, need for further surgery, blood clots, strokes, heart attacks and/or arhythmias, pneumonia, etc.) and benefits. The patient states his understanding and wishes to proceed.   H&P reviewed and patient re-examined. No changes.

## 2020-10-18 NOTE — Anesthesia Preprocedure Evaluation (Signed)
Anesthesia Evaluation  Patient identified by MRN, date of birth, ID band Patient awake    Reviewed: Allergy & Precautions, H&P , NPO status , Patient's Chart, lab work & pertinent test results, reviewed documented beta blocker date and time   Airway Mallampati: III  TM Distance: >3 FB Neck ROM: full    Dental  (+) Teeth Intact, Poor Dentition   Pulmonary sleep apnea , pneumonia, Current Smoker and Patient abstained from smoking.,    Pulmonary exam normal        Cardiovascular Exercise Tolerance: Poor hypertension, On Medications + CAD, + Past MI and + Peripheral Vascular Disease  Normal cardiovascular exam Rate:Normal     Neuro/Psych negative neurological ROS  negative psych ROS   GI/Hepatic Neg liver ROS, hiatal hernia, GERD  Medicated,  Endo/Other  diabetes, Well Controlled  Renal/GU negative Renal ROS  negative genitourinary   Musculoskeletal   Abdominal   Peds  Hematology negative hematology ROS (+)   Anesthesia Other Findings   Reproductive/Obstetrics negative OB ROS                             Anesthesia Physical Anesthesia Plan  ASA: 3  Anesthesia Plan: General LMA   Post-op Pain Management:    Induction:   PONV Risk Score and Plan: 2  Airway Management Planned:   Additional Equipment:   Intra-op Plan:   Post-operative Plan:   Informed Consent: I have reviewed the patients History and Physical, chart, labs and discussed the procedure including the risks, benefits and alternatives for the proposed anesthesia with the patient or authorized representative who has indicated his/her understanding and acceptance.       Plan Discussed with: CRNA  Anesthesia Plan Comments:         Anesthesia Quick Evaluation

## 2020-10-18 NOTE — Discharge Instructions (Addendum)
Orthopedic discharge instructions: Keep splint dry and intact. Keep hand elevated above heart level. Apply ice to affected area frequently. Take ibuprofen 600 mg TID with meals for 5-7 days, then as necessary. Take pain medication as prescribed or ES Tylenol when needed.  May resume Eliquis on Thursday morning, 10/19/2020. Return for follow-up in 3-5 days or as scheduled for dressing change.AMBULATORY SURGERY  DISCHARGE INSTRUCTIONS   The drugs that you were given will stay in your system until tomorrow so for the next 24 hours you should not:  Drive an automobile Make any legal decisions Drink any alcoholic beverage   You may resume regular meals tomorrow.  Today it is better to start with liquids and gradually work up to solid foods.  You may eat anything you prefer, but it is better to start with liquids, then soup and crackers, and gradually work up to solid foods.   Please notify your doctor immediately if you have any unusual bleeding, trouble breathing, redness and pain at the surgery site, drainage, fever, or pain not relieved by medication.     Your post-operative visit with Dr.                                       is: Date:                        Time:    Please call to schedule your post-operative visit.  Additional Instructions:

## 2020-10-18 NOTE — Transfer of Care (Signed)
Immediate Anesthesia Transfer of Care Note  Patient: Martin Adkins  Procedure(s) Performed: DUPUYTREN CONTRACTURE RELEASE (Right: Hand)  Patient Location: PACU  Anesthesia Type:General  Level of Consciousness: awake, drowsy and patient cooperative  Airway & Oxygen Therapy: Patient Spontanous Breathing and Patient connected to face mask oxygen  Post-op Assessment: Report given to RN and Post -op Vital signs reviewed and stable  Post vital signs: Reviewed and stable  Last Vitals:  Vitals Value Taken Time  BP 109/73 10/18/20 1335  Temp    Pulse 57 10/18/20 1341  Resp 16 10/18/20 1341  SpO2 100 % 10/18/20 1341  Vitals shown include unvalidated device data.  Last Pain:  Vitals:   10/18/20 0946  TempSrc: Oral  PainSc: 0-No pain         Complications: No notable events documented.

## 2020-10-18 NOTE — Op Note (Signed)
10/18/2020  3:52 PM  Patient:   Martin Adkins  Pre-Op Diagnosis:   Dupuytren's contracture, right little finger.  Post-Op Diagnosis:   Same.  Procedure:   Release of Dupuytren's contracture, right little finger.  Surgeon:   Pascal Lux, MD  Assistant:   Rennis Chris, PA-S  Anesthesia:   General LMA  Findings:   As above.  Complications:   None  EBL:   1 cc  Fluids:   700 cc crystalloid  TT:   95 minutes at 250 mmHg  Drains:   None  Closure:   4-0 Prolene interrupted sutures  Brief Clinical Note:   The patient is a 76 year old male with a long history of a gradually worsening flexion contracture of the right little finger. The patient's history and examination are consistent with a Dupuytren's contracture of the right little finger. The patient presents at this time for release of the Dupuytren's contracture of the right little finger.  Procedure:   The patient was brought into the operating room and lain in the supine position. After adequate general laryngeal mask anesthesia was achieved, the right hand and upper extremity were prepped with ChloraPrep solution before being draped sterilely. Preoperative antibiotics were administered. After performing a timeout to verify the appropriate surgical site, a Erick Blinks type zigzag incision was made along the volar aspect of the right beginning proximal to the proximal palmar crease and extending to the PIP flexion crease. The incision was carried down through subcutaneous tissues. The fibrous cord was identified and carefully dissected out from proximal to distal after releasing it proximally. As dissection was carried out, care was taken to identify and protect the common digital nerve and artery on either side of the cord, as well as the underlying flexor tendon, proximally. More distally, the digital neurovascular bundles were identified and protected. After the mass was removed in its entirety, the adequacy of excision was  verified by palpation as well as visually. After excision of the Dupuytren's tissue, the right little finger MCP and PIP joints could be extended fully with minimal springiness of the MCP joint at maximal extension.  The wound was copiously irrigated with sterile saline solution before the skin was reapproximated using 4-0 Prolene interrupted sutures. A total of 20 cc of 0.25% plain Sensorcaine was injected in and around the incision to help with postoperative analgesia before a sterile bulky dressing and volar splint extending to the fingertips was applied, maintaining the MCP joints in near full extension. Also, the adequacy of circulation to the right little finger was verified. The patient was then awakened, extubated, and returned to the recovery room in satisfactory condition after tolerating the procedure well.

## 2020-10-18 NOTE — Anesthesia Procedure Notes (Signed)
Procedure Name: LMA Insertion Date/Time: 10/18/2020 11:33 AM Performed by: Kelton Pillar, CRNA Pre-anesthesia Checklist: Patient identified, Emergency Drugs available, Suction available and Patient being monitored Patient Re-evaluated:Patient Re-evaluated prior to induction Oxygen Delivery Method: Circle system utilized Preoxygenation: Pre-oxygenation with 100% oxygen Induction Type: IV induction LMA: LMA inserted LMA Size: 4.0 Placement Confirmation: positive ETCO2, CO2 detector and breath sounds checked- equal and bilateral Tube secured with: Tape Dental Injury: Teeth and Oropharynx as per pre-operative assessment

## 2020-10-19 ENCOUNTER — Encounter: Payer: Self-pay | Admitting: Surgery

## 2020-10-19 LAB — SURGICAL PATHOLOGY

## 2020-10-19 NOTE — Anesthesia Postprocedure Evaluation (Signed)
Anesthesia Post Note  Patient: [name]  Procedure(s) Performed: DUPUYTREN CONTRACTURE RELEASE (Right: Hand)  Patient location during evaluation: PACU Anesthesia Type: General Level of consciousness: awake and alert Pain management: pain level controlled Vital Signs Assessment: post-procedure vital signs reviewed and stable Respiratory status: spontaneous breathing, nonlabored ventilation, respiratory function stable and patient connected to nasal cannula oxygen Cardiovascular status: blood pressure returned to baseline and stable Postop Assessment: no apparent nausea or vomiting Anesthetic complications: no   No notable events documented.   Last Vitals:  Vitals:   10/18/20 1418 10/18/20 1428  BP:  116/84  Pulse: 64 64  Resp: 17 18  Temp:  (!) 35.8 C  SpO2: 99% 94%    Last Pain:  Vitals:   10/18/20 1428  TempSrc: Tympanic  PainSc:                  Molli Barrows

## 2020-11-15 ENCOUNTER — Encounter: Payer: Self-pay | Admitting: Occupational Therapy

## 2020-11-15 ENCOUNTER — Ambulatory Visit: Payer: Medicare PPO | Attending: Student | Admitting: Occupational Therapy

## 2020-11-15 DIAGNOSIS — L905 Scar conditions and fibrosis of skin: Secondary | ICD-10-CM | POA: Diagnosis present

## 2020-11-15 DIAGNOSIS — M6281 Muscle weakness (generalized): Secondary | ICD-10-CM | POA: Insufficient documentation

## 2020-11-15 DIAGNOSIS — M25641 Stiffness of right hand, not elsewhere classified: Secondary | ICD-10-CM | POA: Insufficient documentation

## 2020-11-15 DIAGNOSIS — M25642 Stiffness of left hand, not elsewhere classified: Secondary | ICD-10-CM | POA: Insufficient documentation

## 2020-11-15 NOTE — Therapy (Signed)
[facility] [address], [place], [address] Phone: [phone]   Fax:  [phone]  Occupational Therapy Evaluation  Patient Details  Name: Martin Adkins MRN: [id number] Date of Birth: [birth year] Referring Provider (OT): DR Poggi   Encounter Date: 11/15/2020   OT End of Session - 11/15/20 1838     Visit Number 1    Number of Visits 12    Date for OT Re-Evaluation 12/27/20    OT Start Time 6948    OT Stop Time 1530    OT Time Calculation (min) 41 min    Activity Tolerance Patient tolerated treatment well    Behavior During Therapy [facility] for tasks assessed/performed             Past Medical History:  Diagnosis Date   AAA (abdominal aortic aneurysm) without rupture    a.) [place] 02/09/04 - 3.1 x 3.4 cm. b.) [place] 02/14/05 - 3.62 x 3.63 cm. c.) [place] 02/25/06 - 3.97 x 4.0 cm. d.) CT 07/09/11 - 4.9 cm. e.) s/p EVAR 2013. f.) enlarging AAA --> back to the OR on 09/24/2017 for placement of a 27 mm x 12 cm RIGHT iliac extension limb down to distal CIA.   Aortic atherosclerosis (HCC)    APS (antiphospholipid syndrome) (HCC)    Arthritis    Atrial fibrillation (HCC)    a.) CHA2DS2-VASc Score = 6 (age, CHF, HTN, prior DVT x 2, prior MI). b.) on rivaroxaban   B12 deficiency 03/21/2017   Barrett's esophagus    BPH (benign prostatic hyperplasia)    CAD (coronary artery disease)    a.) LHC 11/14/95 --> 95% mLCx, 95% OM1; PTCA. b.) 4v CABG 2004 --> LIMA-LAD, SVG-D1,RCA,OM. c.) NSTEMI 05/02/2013 --> PCI with DES x 3 to SVG-OM in 2 distinct areas with filter. d.) Inf STEMI 07/30/14 - LHC --> 50% LM, 90% mLCx, 100% OM2, 100% RPDA; LIMA-LAD patent; 100% occ of SVG OM2,OM3,PDA; med mgmt. e.) LHC 01/19/18 - patent LIMA-LAD; other grafts occ; not amenable to PCI/further bypass.   Cardiomyopathy ([place])    Chronic anticoagulation    a.) Rivaroxaban   CLL (chronic lymphocytic leukemia) ([place]) 05/24/2017   DVT (deep venous  thrombosis) (HCC)    GERD (gastroesophageal reflux disease)    HFrEF (heart failure with reduced ejection fraction) ([place])    a.) TTE 02/22/2020 --> mod. LV systolic dysfunction; LVEF 35-40%.   History of hiatal hernia    History of shingles 2013   Hyperlipemia    Hyperplastic polyps of stomach    a.) Bx on 07/24/2020 --> well differentiated NET; WHO grade I.   Hypertension    NSTEMI (non-ST elevated myocardial infarction) ([place]) 05/02/2013   a.) LHC 05/03/2013 --> 50% LM, 100% LAD, 75% mLCx, 100% dLCx, 60% mRCA; patient LIMA-LAD, occluded SVG-D1 and SVG-PDA. SVG-OM with extensive thrombus and distal 99% lesion. HIGH RISK PCI performed at [facility] with placement of DES x 3 to 2 distinct areas of SVG with filter protection.   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 No intervention; medical management.   Valvular regurgitation    a.) TTE 02/22/2020 --> LVEF 35-40%; mild panvalvular.    Past Surgical History:  Procedure Laterality Date   ABDOMINAL AORTA STENT     CHOLECYSTECTOMY     COLONOSCOPY  11/24/2009   COLONOSCOPY N/A 10/09/2020   Procedure: COLONOSCOPY;  Surgeon: Mansouraty, Telford Nab., MD;  Location: [facility] ENDOSCOPY;  Service: Gastroenterology;  Laterality: N/A;   COLONOSCOPY WITH PROPOFOL N/A 07/24/2020   Procedure: COLONOSCOPY WITH PROPOFOL;  Surgeon: Toledo, Benay Pike, MD;  Location: [facility] ENDOSCOPY;   Service: Gastroenterology;  Laterality: N/A;   CORONARY ANGIOPLASTY WITH STENT PLACEMENT Left 05/03/2013   Procedure: CORONARY ANGIOPLASTY WITH STENT PLACEMENT (DES x 3 to SVG-OM graft); Location: [facility]   CORONARY ARTERY BYPASS GRAFT N/A 01/21/2002   Procedure: 4V CABG (LIMA-LAD, SVG-D1, SVG-RCA, SVG-OM); Location: [facility]   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   Procedure: DUPUYTREN CONTRACTURE RELEASE LEFT LONG FINGER;  Surgeon: Corky Mull, MD;  Location: [facility] ORS;  Service: Orthopedics;  Laterality: Left;   DUPUYTREN CONTRACTURE RELEASE Right 10/18/2020   Procedure: DUPUYTREN CONTRACTURE RELEASE;  Surgeon: Corky Mull, MD;  Location: [facility] ORS;  Service: Orthopedics;  Laterality: Right;   ENDOVASCULAR REPAIR/STENT GRAFT N/A 09/24/2017   Procedure: ENDOVASCULAR REPAIR/STENT GRAFT (27 mm x 12 cm RIGHT iliac limb extension to distal CIA);  Surgeon: Algernon Huxley, MD;  Location: [place] [facility];  Service: Cardiovascular;  Laterality: N/A;   ESOPHAGOGASTRODUODENOSCOPY  05/26/02  03/23/12   ESOPHAGOGASTRODUODENOSCOPY (EGD) WITH PROPOFOL N/A 10/28/2016   Procedure: ESOPHAGOGASTRODUODENOSCOPY (EGD) WITH PROPOFOL;  Surgeon: Manya Silvas, MD;  Location: [facility] ENDOSCOPY;  Service: Endoscopy;  Laterality: N/A;   EUS N/A 10/09/2020   Procedure: LOWER ENDOSCOPIC ULTRASOUND (EUS);  Surgeon: Irving Copas., MD;  Location: [facility];  Service: Gastroenterology;  Laterality: N/A;   INCISION AND DRAINAGE ABSCESS N/A 06/14/2020   Procedure: INCISION AND DRAINAGE ABSCESS;  Surgeon: Herbert Pun, MD;  Location: [facility] ORS;  Service: General;  Laterality: N/A;   INGUINAL HERNIA REPAIR Left    LEFT HEART CATH AND CORONARY ANGIOGRAPHY N/A 01/19/2018   Procedure: LEFT HEART CATH AND CORONARY ANGIOGRAPHY;  Surgeon: Teodoro Spray, MD;  Location: [place] [facility];  Service: Cardiovascular;  Laterality: N/A;   LEFT HEART CATH AND CORONARY ANGIOGRAPHY Left 11/14/1995   Procedure:  CARDIAC CATHETERIZATION (PTCA); Location: [facility]; Surgeon: Doristine Bosworth, MD   LEFT HEART CATH AND CORS/GRAFTS ANGIOGRAPHY Left 07/30/2014   Procedure: CARDIAC CATHETERIZATION; Location: [facility]   PERIPHERAL VASCULAR CATHETERIZATION N/A 09/06/2014   Procedure: IVC Filter Removal;  Surgeon: Katha Cabal, MD;  Location: [facility];  Service: Cardiovascular;  Laterality: N/A;   POLYPECTOMY  10/09/2020   Procedure: POLYPECTOMY;  Surgeon: Rush Landmark Telford Nab., MD;  Location: [facility];  Service: Gastroenterology;;   SUBMUCOSAL LIFTING INJECTION  10/09/2020   Procedure: SUBMUCOSAL LIFTING INJECTION;  Surgeon: Irving Copas., MD;  Location: [facility];  Service: Gastroenterology;;   TRANSURETHRAL RESECTION OF PROSTATE N/A 02/20/2015   Procedure: TRANSURETHRAL RESECTION OF THE PROSTATE (TURP);  Surgeon: Hollice Espy, MD;  Location: [facility] ORS;  Service: Urology;  Laterality: N/A;    There were no vitals filed for this visit.   Subjective Assessment - 11/15/20  1829     Subjective  This hand is better than the R hand was -seen you about year ago for the other hand - still swelling and some scabs - cannot grip , pull or push yet- or make fist - tight , stiff , swollen -    Pertinent History Hayven Schriver is a 75 y.o. male who  had suture removal on 10/30/20, - Had on 11/18/20 release of Dupuytren's contracture from the right little finger. Surgery was performed by Dr. Roland Rack. Overall the patient feels that he is doing well, he does still report moderate swelling in the right hand and refer to OT    Patient Stated Goals I want to get the scar better so I can make fist and open my hand to get my hand in my pocket, pick up and hold objects - and push and pull without pain    Currently in Pain? 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, do Haematologist, house work       Edema   Edema MC R hand 22cm ,L 20 cm - prox phalanges 5th R 6.5 cm and L 5.5 cm      Right Hand AROM   R Index  MCP 0-90 80 Degrees    R Index PIP 0-100 95 Degrees    R Long  MCP 0-90 80 Degrees    R Long PIP 0-100 90 Degrees    R Ring  MCP 0-90 80 Degrees   -15   R Ring PIP 0-100 85 Degrees    R Little  MCP 0-90 80 Degrees   -30   R Little PIP 0-100 55 Degrees            HEP Heat  done and pt ed on scar massage -and cica scar pad for night time - isotoner glove day and night time Pt to do at home contrast 2-3 x day HEP   Rolling foam roller for extention and scar massage Tendon glides AAROM into extention of digits  Fisting to foam roller - not palm  Pull or stretch - less than 2/10                   OT Education - 11/15/20 1838     Education Details Findings of eval and HEP    Person(s) Educated Patient    Methods Explanation;Demonstration;Tactile cues;Verbal cues;Handout    Comprehension Verbal cues required;Returned demonstration;Verbalized understanding              OT Short Term Goals - 11/15/20 1842       OT SHORT TERM GOAL #1   Title Pt to be  independent in scar management to decrease tenderness and increase AROM in R digits with decrease pain    Baseline scar tender at [facility] and proximal crease and pain increase to 3-5/10 at the most - scar thick and scabs still on 3 places - decrease ext and flexion    Time 3    Period Weeks    Status New    Target Date 12/06/20                OT Long Term Goals - 11/15/20 1844       OT LONG TERM GOAL #1   Title R grip and prehension strength increase to more than 75% compare to L hand to return to pull ,push door , squeeze washcloth without increase symptoms    Baseline grip NT - decrease AROM -and 4 wks s/p    Time 6    Period Weeks    Status New    Target Date 12/27/20      OT LONG TERM GOAL #2   Title R digits AROM increase to [facility] to touch palm to turn doorknob, grip steering wheel and utencils    Baseline Flexion at MC's 80's and PIP at 5th 55 degrees, 4th 85 degrees- unable to grip cylinder objects    Time 6    Period Weeks    Status New    Target Date 12/27/20      OT LONG TERM GOAL #3   Title Pt able to maintain extention of 4th and 5th  MC and PIP without splint for month    Baseline MC's extention 5th -30 and 4th -15 degrees - scabs still thick    Time 6    Period Weeks    Status New    Target Date 12/27/20                   Plan - 11/15/20 1838     Clinical Impression Statement Pt present at OT eval 4 wks s/p R dupuytrens release on palm and 5th digit into PIP crease - still 3 scabs , Z scar thick and dry but able to tolerate textures, touch and tapping - but tender at certain areas with massage- Edema still present over [facility] and 5th digit- more than 1-2 cm increase in circumerence  compare to R hand. Pt with decrease extention and flexion in digits , increase scar tissue and pain - with decrease strenght and functional use of dominant R hand in ADlL's and IADl's - pt can benefit from OT services to return to independent [place] of R dominant hand.    OT Occupational Profile and History Problem Focused Assessment - Including review of records relating to presenting problem    Occupational performance deficits (Please refer to evaluation for details): ADL's;IADL's;Play;Leisure    Body Structure / Function / Physical Skills ADL;Flexibility;ROM;UE functional use;Scar  mobility;Edema;Pain;Strength;IADL    Rehab Potential Good    Clinical Decision Making Limited treatment options, no task modification necessary    Comorbidities Affecting Occupational Performance: None    Modification or Assistance to Complete Evaluation  No modification of tasks or assist necessary to complete eval    OT Frequency 2x / week    OT Duration 6 weeks    OT Treatment/Interventions Self-care/ADL training;Patient/family education;Splinting;Paraffin;Fluidtherapy;Contrast Bath;Manual Therapy;Passive range of motion;Scar mobilization;Moist Heat    Consulted and Agree with Plan of Care Patient             Patient will benefit from skilled therapeutic  intervention in order to improve the following deficits and impairments:   Body Structure / Function / Physical Skills: ADL, Flexibility, ROM, UE functional use, Scar mobility, Edema, Pain, Strength, IADL       Visit Diagnosis: Muscle weakness (generalized) - Plan: Ot plan of care cert/re-cert  Scar condition and fibrosis of skin - Plan: Ot plan of care cert/re-cert  Stiffness of right hand, not elsewhere classified - Plan: Ot plan of care cert/re-cert    Problem List Patient Active Problem List   Diagnosis Date Noted   Carotid stenosis 09/26/2020   Primary neuroendocrine carcinoma of rectum ([place]) 08/22/2020   Dupuytren's contracture of left hand 11/02/2018   Sepsis ([place]) 08/27/2018   Non-ST elevation MI (NSTEMI) ([place]) 01/18/2018   AAA (abdominal aortic aneurysm) 09/24/2017   Leaking abdominal aortic aneurysm (AAA) 08/22/2017   CLL (chronic lymphocytic leukemia) ([place]) 05/24/2017   Goals of care, counseling/discussion 05/24/2017   B12 deficiency 03/21/2017   Elevated troponin I level 03/03/2015   Deep venous thrombosis of profunda femoris vein ([place]) 01/23/2015   BPH (benign prostatic hyperplasia) 01/23/2015   Urge urinary incontinence 01/23/2015   Type 2 diabetes mellitus ([place]) 10/31/2014   Benign gastric polyp  10/31/2014   Absence of bladder continence 10/09/2014   Acute non-ST elevation myocardial infarction (NSTEMI) ([place]) 08/15/2014   Atrial fibrillation ([place]) 08/05/2014   Antiphospholipid syndrome ([place]) 08/01/2014   H/O deep venous thrombosis 08/01/2014   Non-ST elevation myocardial infarction (NSTEMI), subendocardial infarction, subsequent episode of care ([place]) 07/30/2014   Barrett esophagus 07/28/2014   Diabetes mellitus, type 2 ([place]) 07/28/2014   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/28/2014   Hyperplastic adenomatous polyp of stomach 07/28/2014   Benign essential HTN 41/32/4401   Chronic systolic heart failure ([place]) 04/19/2014   AAA (abdominal aortic aneurysm) without rupture 04/05/2014   Arteriosclerosis of coronary artery 04/05/2014   Breathlessness on exertion 04/05/2014   Abdominal aortic aneurysm (AAA) without rupture 04/05/2014   Acute non-ST segment elevation myocardial infarction ([place]) 05/14/2013   Acid reflux 05/04/2013   Combined fat and carbohydrate induced hyperlipemia 05/03/2013   Obstructive apnea 05/03/2013   Arthritis, degenerative 05/03/2013   Peripheral blood vessel disorder ([place]) 05/03/2013   Arthritis or polyarthritis, rheumatoid ([place]) 05/03/2013   Compulsive tobacco user syndrome 05/03/2013   Rheumatoid arthritis ([place]) 05/03/2013   Peripheral vascular disease ([place]) 05/03/2013   Current tobacco use 05/03/2013    Rosalyn Gess, OTR/L,CLT 11/15/2020, 6:50 PM  [facility] [address] [place], [address] Phone: [phone]   Fax:  [phone]  Name: JA OHMAN MRN: [id number] Date of Birth: 1945/08/09

## 2020-11-21 ENCOUNTER — Ambulatory Visit: Payer: Medicare PPO | Admitting: Occupational Therapy

## 2020-11-21 DIAGNOSIS — L905 Scar conditions and fibrosis of skin: Secondary | ICD-10-CM

## 2020-11-21 DIAGNOSIS — M6281 Muscle weakness (generalized): Secondary | ICD-10-CM

## 2020-11-21 DIAGNOSIS — M25641 Stiffness of right hand, not elsewhere classified: Secondary | ICD-10-CM

## 2020-11-21 NOTE — Therapy (Signed)
[facility] [address], [place], [address] Phone: [phone]   Fax:  [phone]  Occupational Therapy Treatment  Patient Details  Name: Martin Adkins MRN: [id number] Date of Birth: December 05, 1945 Referring Provider (OT): DR Poggi   Encounter Date: 11/21/2020   OT End of Session - 11/21/20 0816     Visit Number 2    Number of Visits 12    Date for OT Re-Evaluation 12/27/20    OT Start Time 0816    OT Stop Time 0900    OT Time Calculation (min) 44 min    Activity Tolerance Patient tolerated treatment well    Behavior During Therapy [facility] for tasks assessed/performed             Past Medical History:  Diagnosis Date   AAA (abdominal aortic aneurysm) without rupture    a.) [place] 02/09/04 - 3.1 x 3.4 cm. b.) [place] 02/14/05 - 3.62 x 3.63 cm. c.) [place] 02/25/06 - 3.97 x 4.0 cm. d.) CT 07/09/11 - 4.9 cm. e.) s/p EVAR 2013. f.) enlarging AAA --> back to the OR on 09/24/2017 for placement of a 27 mm x 12 cm RIGHT iliac extension limb down to distal CIA.   Aortic atherosclerosis (HCC)    APS (antiphospholipid syndrome) (HCC)    Arthritis    Atrial fibrillation (HCC)    a.) CHA2DS2-VASc Score = 6 (age, CHF, HTN, prior DVT x 2, prior MI). b.) on rivaroxaban   B12 deficiency 03/21/2017   Barrett's esophagus    BPH (benign prostatic hyperplasia)    CAD (coronary artery disease)    a.) LHC 11/14/95 --> 95% mLCx, 95% OM1; PTCA. b.) 4v CABG 2004 --> LIMA-LAD, SVG-D1,RCA,OM. c.) NSTEMI 05/02/2013 --> PCI with DES x 3 to SVG-OM in 2 distinct areas with filter. d.) Inf STEMI 07/30/14 - LHC --> 50% LM, 90% mLCx, 100% OM2, 100% RPDA; LIMA-LAD patent; 100% occ of SVG OM2,OM3,PDA; med mgmt. e.) LHC 01/19/18 - patent LIMA-LAD; other grafts occ; not amenable to PCI/further bypass.   Cardiomyopathy ([place])    Chronic anticoagulation    a.) Rivaroxaban   CLL (chronic lymphocytic leukemia) ([place]) 05/24/2017   DVT (deep venous  thrombosis) (HCC)    GERD (gastroesophageal reflux disease)    HFrEF (heart failure with reduced ejection fraction) ([place])    a.) TTE 02/22/2020 --> mod. LV systolic dysfunction; LVEF 35-40%.   History of hiatal hernia    History of shingles 2013   Hyperlipemia    Hyperplastic polyps of stomach    a.) Bx on 07/24/2020 --> well differentiated NET; WHO grade I.   Hypertension    NSTEMI (non-ST elevated myocardial infarction) ([place]) 05/02/2013   a.) LHC 05/03/2013 --> 50% LM, 100% LAD, 75% mLCx, 100% dLCx, 60% mRCA; patient LIMA-LAD, occluded SVG-D1 and SVG-PDA. SVG-OM with extensive thrombus and distal 99% lesion. HIGH RISK PCI performed at [facility] with placement of DES x 3 to 2 distinct areas of SVG with filter protection.   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 No intervention; medical management.   Valvular regurgitation    a.) TTE 02/22/2020 --> LVEF 35-40%; mild panvalvular.    Past Surgical History:  Procedure Laterality Date   ABDOMINAL AORTA STENT     CHOLECYSTECTOMY     COLONOSCOPY  11/24/2009   COLONOSCOPY N/A 10/09/2020   Procedure: COLONOSCOPY;  Surgeon: Mansouraty, Telford Nab., MD;  Location: [facility];  Service: Gastroenterology;  Laterality: N/A;   COLONOSCOPY WITH PROPOFOL N/A 07/24/2020   Procedure: COLONOSCOPY WITH PROPOFOL;  Surgeon: Toledo, Benay Pike, MD;  Location: [facility];   Service: Gastroenterology;  Laterality: N/A;   CORONARY ANGIOPLASTY WITH STENT PLACEMENT Left 05/03/2013   Procedure: CORONARY ANGIOPLASTY WITH STENT PLACEMENT (DES x 3 to SVG-OM graft); Location: [facility]   CORONARY ARTERY BYPASS GRAFT N/A 01/21/2002   Procedure: 4V CABG (LIMA-LAD, SVG-D1, SVG-RCA, SVG-OM); Location: [facility]   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   Procedure: DUPUYTREN CONTRACTURE RELEASE LEFT LONG FINGER;  Surgeon: Corky Mull, MD;  Location: [facility];  Service: Orthopedics;  Laterality: Left;   DUPUYTREN CONTRACTURE RELEASE Right 10/18/2020   Procedure: DUPUYTREN CONTRACTURE RELEASE;  Surgeon: Corky Mull, MD;  Location: [facility];  Service: Orthopedics;  Laterality: Right;   ENDOVASCULAR REPAIR/STENT GRAFT N/A 09/24/2017   Procedure: ENDOVASCULAR REPAIR/STENT GRAFT (27 mm x 12 cm RIGHT iliac limb extension to distal CIA);  Surgeon: Algernon Huxley, MD;  Location: [facility];  Service: Cardiovascular;  Laterality: N/A;   ESOPHAGOGASTRODUODENOSCOPY  05/26/02  03/23/12   ESOPHAGOGASTRODUODENOSCOPY (EGD) WITH PROPOFOL N/A 10/28/2016   Procedure: ESOPHAGOGASTRODUODENOSCOPY (EGD) WITH PROPOFOL;  Surgeon: Manya Silvas, MD;  Location: [facility];  Service: Endoscopy;  Laterality: N/A;   EUS N/A 10/09/2020   Procedure: LOWER ENDOSCOPIC ULTRASOUND (EUS);  Surgeon: Irving Copas., MD;  Location: [facility];  Service: Gastroenterology;  Laterality: N/A;   INCISION AND DRAINAGE ABSCESS N/A 06/14/2020   Procedure: INCISION AND DRAINAGE ABSCESS;  Surgeon: Herbert Pun, MD;  Location: [facility];  Service: General;  Laterality: N/A;   INGUINAL HERNIA REPAIR Left    LEFT HEART CATH AND CORONARY ANGIOGRAPHY N/A 01/19/2018   Procedure: LEFT HEART CATH AND CORONARY ANGIOGRAPHY;  Surgeon: Teodoro Spray, MD;  Location: [facility];  Service: Cardiovascular;  Laterality: N/A;   LEFT HEART CATH AND CORONARY ANGIOGRAPHY Left 11/14/1995   Procedure:  CARDIAC CATHETERIZATION (PTCA); Location: [facility]; Surgeon: Doristine Bosworth, MD   LEFT HEART CATH AND CORS/GRAFTS ANGIOGRAPHY Left 07/30/2014   Procedure: CARDIAC CATHETERIZATION; Location: [facility]   PERIPHERAL VASCULAR CATHETERIZATION N/A 09/06/2014   Procedure: IVC Filter Removal;  Surgeon: Katha Cabal, MD;  Location: [facility];  Service: Cardiovascular;  Laterality: N/A;   POLYPECTOMY  10/09/2020   Procedure: POLYPECTOMY;  Surgeon: Rush Landmark Telford Nab., MD;  Location: [facility];  Service: Gastroenterology;;   SUBMUCOSAL LIFTING INJECTION  10/09/2020   Procedure: SUBMUCOSAL LIFTING INJECTION;  Surgeon: Irving Copas., MD;  Location: [facility];  Service: Gastroenterology;;   TRANSURETHRAL RESECTION OF PROSTATE N/A 02/20/2015   Procedure: TRANSURETHRAL RESECTION OF THE PROSTATE (TURP);  Surgeon: Hollice Espy, MD;  Location: [facility];  Service: Urology;  Laterality: N/A;    There were no vitals filed for this visit.   Subjective Assessment - 11/21/20  0816     Subjective  DId okay with the exercises and scar massage- but need new scar pads - pain with making fist - just tight and stiff -and sore soreness in the pinkie joint    Pertinent History Martin Adkins is a 75 y.o. male who  had suture removal on 10/30/20, - Had on 11/18/20 release of Dupuytren's contracture from the right little finger. Surgery was performed by Dr. Roland Rack. Overall the patient feels that he is doing well, he does still report moderate swelling in the right hand and refer to OT    Patient Stated Goals I want to get the scar better so I can make fist and open my hand to get my hand in my pocket, pick up and hold objects - and push and pull without pain    Currently in Pain? Yes    Pain Score 7     Pain Location Hand    Pain Orientation Left    Pain Descriptors / Indicators Aching;Tender;Tightness    Pain Type Surgical pain    Pain Onset More than a month ago    Pain Frequency Intermittent                 [facility] OT Assessment - 11/21/20 0001       Right Hand AROM   R Ring  MCP 0-90 85 Degrees   -15   R Ring PIP 0-100 90 Degrees    R Little  MCP 0-90 85 Degrees   -25 coming in and -20 in session ,PROM 0   R Little PIP 0-100 65 Degrees              AROM measure for flexion and extention of 4th and 5th digit- cont to have pain and edema in 5th [place] and proximal phalanges         OT Treatments/Exercises (OP) - 11/21/20 0001       RUE Fluidotherapy   Number Minutes Fluidotherapy 10 Minutes    RUE Fluidotherapy Location Hand;Wrist    Comments decrease edema and pain - 2 x ice 1 min              Fabricate hand base extention splint for 4th and 5th digits at 0 degrees for night time use -  scar massage  done by OT manual and using mini massager - as well as provided new cica scar pad for night time  and then also some use during day silicon sleeve for 5th digit  Cont  isotoner glove night time and some during day  Pt to do at home contrast 2-3 x day HEP   Rolling foam roller for extention and scar massage 20 reps Tendon glides AAROM  for MC and intrinsic fist with AAROM into extention of digits But not PROM for 5th composite yet   Fisting but keep pain in 5th MC less than 2-3/10          OT Education - 11/21/20 0816     Education Details progress and changes to HEP    Person(s) Educated Patient    Methods Explanation;Demonstration;Tactile cues;Verbal cues;Handout    Comprehension Verbal cues required;Returned demonstration;Verbalized understanding              OT Short Term Goals - 11/15/20 1842       OT SHORT TERM GOAL #1   Title Pt to be independent in scar management to decrease tenderness and increase AROM in R digits with decrease pain  Baseline scar tender at [facility], [facility] and proximal crease and pain increase to 3-5/10 at the most - scar thick and scabs still on 3 places - decrease ext and flexion    Time 3    Period Weeks     Status New    Target Date 12/06/20               OT Long Term Goals - 11/15/20 1844       OT LONG TERM GOAL #1   Title R grip and prehension strength increase to more than 75% compare to L hand to return to pull ,push door , squeeze washcloth without increase symptoms    Baseline grip NT - decrease AROM -and 4 wks s/p    Time 6    Period Weeks    Status New    Target Date 12/27/20      OT LONG TERM GOAL #2   Title R digits AROM increase to [facility] to touch palm to turn doorknob, grip steering wheel and utencils    Baseline Flexion at MC's 80's and PIP at 5th 55 degrees, 4th 85 degrees- unable to grip cylinder objects    Time 6    Period Weeks    Status New    Target Date 12/27/20      OT LONG TERM GOAL #3   Title Pt able to maintain extention of 4th and 5th  MC and PIP without splint for month    Baseline MC's extention 5th -30 and 4th -15 degrees - scabs still thick    Time 6    Period Weeks    Status New    Target Date 12/27/20                   Plan - 11/21/20 0817     Clinical Impression Statement Pt present at OT  s/p R dupuytrens release on palm and 5th digit into PIP crease  on 10/18/20 and tomorrow 5 wks s/p - Z scar thick  but able to tolerate textures, touch and tapping - but tender  and cont to have edema still present over [facility] and 5th digit- more than 1-2 cm increase in circumerence  compare to R hand. Pt with decrease extention and flexion in digits  but improved in flexion and extention at 4th and 5th but did fabricate hand base extention splint for 4th and 5th for night time use - cont to show increase scar tissue and pain - with decrease strenght and functional use of dominant R hand in ADlL's and IADl's - pt can benefit from OT services to return to independent [place] of R dominant hand.    OT Occupational Profile and History Problem Focused Assessment - Including review of records relating to presenting problem    Occupational performance deficits (Please  refer to evaluation for details): ADL's;IADL's;Play;Leisure    Body Structure / Function / Physical Skills ADL;Flexibility;ROM;UE functional use;Scar mobility;Edema;Pain;Strength;IADL    Rehab Potential Good    Clinical Decision Making Limited treatment options, no task modification necessary    Comorbidities Affecting Occupational Performance: None    Modification or Assistance to Complete Evaluation  No modification of tasks or assist necessary to complete eval    OT Frequency 2x / week    OT Duration 6 weeks    OT Treatment/Interventions Self-care/ADL training;Patient/family education;Splinting;Paraffin;Fluidtherapy;Contrast Bath;Manual Therapy;Passive range of motion;Scar mobilization;Moist Heat    OT Home Exercise Plan see pt instruction    Consulted and Agree with Plan  of Care Patient             Patient will benefit from skilled therapeutic intervention in order to improve the following deficits and impairments:   Body Structure / Function / Physical Skills: ADL, Flexibility, ROM, UE functional use, Scar mobility, Edema, Pain, Strength, IADL       Visit Diagnosis: Muscle weakness (generalized)  Scar condition and fibrosis of skin  Stiffness of right hand, not elsewhere classified    Problem List Patient Active Problem List   Diagnosis Date Noted   Carotid stenosis 09/26/2020   Primary neuroendocrine carcinoma of rectum ([place]) 08/22/2020   Dupuytren's contracture of left hand 11/02/2018   Sepsis ([place]) 08/27/2018   Non-ST elevation MI (NSTEMI) ([place]) 01/18/2018   AAA (abdominal aortic aneurysm) 09/24/2017   Leaking abdominal aortic aneurysm (AAA) 08/22/2017   CLL (chronic lymphocytic leukemia) ([place]) 05/24/2017   Goals of care, counseling/discussion 05/24/2017   B12 deficiency 03/21/2017   Elevated troponin I level 03/03/2015   Deep venous thrombosis of profunda femoris vein ([place]) 01/23/2015   BPH (benign prostatic hyperplasia) 01/23/2015   Urge urinary incontinence  01/23/2015   Type 2 diabetes mellitus ([place]) 10/31/2014   Benign gastric polyp 10/31/2014   Absence of bladder continence 10/09/2014   Acute non-ST elevation myocardial infarction (NSTEMI) ([place]) 08/15/2014   Atrial fibrillation ([place]) 08/05/2014   Antiphospholipid syndrome ([place]) 08/01/2014   H/O deep venous thrombosis 08/01/2014   Non-ST elevation myocardial infarction (NSTEMI), subendocardial infarction, subsequent episode of care ([place]) 07/30/2014   Barrett esophagus 07/28/2014   Diabetes mellitus, type 2 ([place]) 07/28/2014   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/28/2014   Hyperplastic adenomatous polyp of stomach 07/28/2014   Benign essential HTN 49/20/1007   Chronic systolic heart failure ([place]) 04/19/2014   AAA (abdominal aortic aneurysm) without rupture 04/05/2014   Arteriosclerosis of coronary artery 04/05/2014   Breathlessness on exertion 04/05/2014   Abdominal aortic aneurysm (AAA) without rupture 04/05/2014   Acute non-ST segment elevation myocardial infarction ([place]) 05/14/2013   Acid reflux 05/04/2013   Combined fat and carbohydrate induced hyperlipemia 05/03/2013   Obstructive apnea 05/03/2013   Arthritis, degenerative 05/03/2013   Peripheral blood vessel disorder ([place]) 05/03/2013   Arthritis or polyarthritis, rheumatoid ([place]) 05/03/2013   Compulsive tobacco user syndrome 05/03/2013   Rheumatoid arthritis ([place]) 05/03/2013   Peripheral vascular disease ([place]) 05/03/2013   Current tobacco use 05/03/2013    Rosalyn Gess, OTR/L,CLT 11/21/2020, 9:41 AM  [facility] [address], [place], [address] Phone: [phone]   Fax:  [phone]  Name: Martin Adkins MRN: [id number] Date of Birth: September 13, 1945

## 2020-11-23 ENCOUNTER — Ambulatory Visit: Payer: Medicare PPO | Admitting: Occupational Therapy

## 2020-11-23 DIAGNOSIS — L905 Scar conditions and fibrosis of skin: Secondary | ICD-10-CM

## 2020-11-23 DIAGNOSIS — M6281 Muscle weakness (generalized): Secondary | ICD-10-CM | POA: Diagnosis not present

## 2020-11-23 DIAGNOSIS — M25641 Stiffness of right hand, not elsewhere classified: Secondary | ICD-10-CM

## 2020-11-23 DIAGNOSIS — M25642 Stiffness of left hand, not elsewhere classified: Secondary | ICD-10-CM

## 2020-11-23 NOTE — Therapy (Signed)
[facility] [address], [place], [address] Phone: [phone]   Fax:  [phone]  Occupational Therapy Treatment  Patient Details  Name: Martin Adkins MRN: [id number] Date of Birth: April 05, 1945 Referring Provider (OT): DR Poggi   Encounter Date: 11/23/2020   OT End of Session - 11/23/20 1500     Visit Number 3    Number of Visits 12    Date for OT Re-Evaluation 12/27/20    OT Start Time 1450    OT Stop Time 1517    OT Time Calculation (min) 27 min    Activity Tolerance Patient tolerated treatment well    Behavior During Therapy [facility] for tasks assessed/performed             Past Medical History:  Diagnosis Date   AAA (abdominal aortic aneurysm) without rupture    a.) [place] 02/09/04 - 3.1 x 3.4 cm. b.) [place] 02/14/05 - 3.62 x 3.63 cm. c.) [place] 02/25/06 - 3.97 x 4.0 cm. d.) CT 07/09/11 - 4.9 cm. e.) s/p EVAR 2013. f.) enlarging AAA --> back to the OR on 09/24/2017 for placement of a 27 mm x 12 cm RIGHT iliac extension limb down to distal CIA.   Aortic atherosclerosis (HCC)    APS (antiphospholipid syndrome) (HCC)    Arthritis    Atrial fibrillation (HCC)    a.) CHA2DS2-VASc Score = 6 (age, CHF, HTN, prior DVT x 2, prior MI). b.) on rivaroxaban   B12 deficiency 03/21/2017   Barrett's esophagus    BPH (benign prostatic hyperplasia)    CAD (coronary artery disease)    a.) LHC 11/14/95 --> 95% mLCx, 95% OM1; PTCA. b.) 4v CABG 2004 --> LIMA-LAD, SVG-D1,RCA,OM. c.) NSTEMI 05/02/2013 --> PCI with DES x 3 to SVG-OM in 2 distinct areas with filter. d.) Inf STEMI 07/30/14 - LHC --> 50% LM, 90% mLCx, 100% OM2, 100% RPDA; LIMA-LAD patent; 100% occ of SVG OM2,OM3,PDA; med mgmt. e.) LHC 01/19/18 - patent LIMA-LAD; other grafts occ; not amenable to PCI/further bypass.   Cardiomyopathy ([place])    Chronic anticoagulation    a.) Rivaroxaban   CLL (chronic lymphocytic leukemia) ([place]) 05/24/2017   DVT (deep venous  thrombosis) (HCC)    GERD (gastroesophageal reflux disease)    HFrEF (heart failure with reduced ejection fraction) ([place])    a.) TTE 02/22/2020 --> mod. LV systolic dysfunction; LVEF 35-40%.   History of hiatal hernia    History of shingles 2013   Hyperlipemia    Hyperplastic polyps of stomach    a.) Bx on 07/24/2020 --> well differentiated NET; WHO grade I.   Hypertension    NSTEMI (non-ST elevated myocardial infarction) ([place]) 05/02/2013   a.) LHC 05/03/2013 --> 50% LM, 100% LAD, 75% mLCx, 100% dLCx, 60% mRCA; patient LIMA-LAD, occluded SVG-D1 and SVG-PDA. SVG-OM with extensive thrombus and distal 99% lesion. HIGH RISK PCI performed at [facility] with placement of DES x 3 to 2 distinct areas of SVG with filter protection.   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 No intervention; medical management.   Valvular regurgitation    a.) TTE 02/22/2020 --> LVEF 35-40%; mild panvalvular.    Past Surgical History:  Procedure Laterality Date   ABDOMINAL AORTA STENT     CHOLECYSTECTOMY     COLONOSCOPY  11/24/2009   COLONOSCOPY N/A 10/09/2020   Procedure: COLONOSCOPY;  Surgeon: Mansouraty, Telford Nab., MD;  Location: [facility] ENDOSCOPY;  Service: Gastroenterology;  Laterality: N/A;   COLONOSCOPY WITH PROPOFOL N/A 07/24/2020   Procedure: COLONOSCOPY WITH PROPOFOL;  Surgeon: Toledo, Benay Pike, MD;  Location: [facility] ENDOSCOPY;   Service: Gastroenterology;  Laterality: N/A;   CORONARY ANGIOPLASTY WITH STENT PLACEMENT Left 05/03/2013   Procedure: CORONARY ANGIOPLASTY WITH STENT PLACEMENT (DES x 3 to SVG-OM graft); Location: [facility]   CORONARY ARTERY BYPASS GRAFT N/A 01/21/2002   Procedure: 4V CABG (LIMA-LAD, SVG-D1, SVG-RCA, SVG-OM); Location: [facility]   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   Procedure: DUPUYTREN CONTRACTURE RELEASE LEFT LONG FINGER;  Surgeon: Corky Mull, MD;  Location: [facility] ORS;  Service: Orthopedics;  Laterality: Left;   DUPUYTREN CONTRACTURE RELEASE Right 10/18/2020   Procedure: DUPUYTREN CONTRACTURE RELEASE;  Surgeon: Corky Mull, MD;  Location: [facility] ORS;  Service: Orthopedics;  Laterality: Right;   ENDOVASCULAR REPAIR/STENT GRAFT N/A 09/24/2017   Procedure: ENDOVASCULAR REPAIR/STENT GRAFT (27 mm x 12 cm RIGHT iliac limb extension to distal CIA);  Surgeon: Algernon Huxley, MD;  Location: [facility];  Service: Cardiovascular;  Laterality: N/A;   ESOPHAGOGASTRODUODENOSCOPY  05/26/02  03/23/12   ESOPHAGOGASTRODUODENOSCOPY (EGD) WITH PROPOFOL N/A 10/28/2016   Procedure: ESOPHAGOGASTRODUODENOSCOPY (EGD) WITH PROPOFOL;  Surgeon: Manya Silvas, MD;  Location: [facility] ENDOSCOPY;  Service: Endoscopy;  Laterality: N/A;   EUS N/A 10/09/2020   Procedure: LOWER ENDOSCOPIC ULTRASOUND (EUS);  Surgeon: Irving Copas., MD;  Location: [facility];  Service: Gastroenterology;  Laterality: N/A;   INCISION AND DRAINAGE ABSCESS N/A 06/14/2020   Procedure: INCISION AND DRAINAGE ABSCESS;  Surgeon: Herbert Pun, MD;  Location: [facility] ORS;  Service: General;  Laterality: N/A;   INGUINAL HERNIA REPAIR Left    LEFT HEART CATH AND CORONARY ANGIOGRAPHY N/A 01/19/2018   Procedure: LEFT HEART CATH AND CORONARY ANGIOGRAPHY;  Surgeon: Teodoro Spray, MD;  Location: [facility];  Service: Cardiovascular;  Laterality: N/A;   LEFT HEART CATH AND CORONARY ANGIOGRAPHY Left 11/14/1995   Procedure:  CARDIAC CATHETERIZATION (PTCA); Location: [facility]; Surgeon: Doristine Bosworth, MD   LEFT HEART CATH AND CORS/GRAFTS ANGIOGRAPHY Left 07/30/2014   Procedure: CARDIAC CATHETERIZATION; Location: [facility]   PERIPHERAL VASCULAR CATHETERIZATION N/A 09/06/2014   Procedure: IVC Filter Removal;  Surgeon: Katha Cabal, MD;  Location: [facility];  Service: Cardiovascular;  Laterality: N/A;   POLYPECTOMY  10/09/2020   Procedure: POLYPECTOMY;  Surgeon: Rush Landmark Telford Nab., MD;  Location: [facility];  Service: Gastroenterology;;   SUBMUCOSAL LIFTING INJECTION  10/09/2020   Procedure: SUBMUCOSAL LIFTING INJECTION;  Surgeon: Irving Copas., MD;  Location: [facility];  Service: Gastroenterology;;   TRANSURETHRAL RESECTION OF PROSTATE N/A 02/20/2015   Procedure: TRANSURETHRAL RESECTION OF THE PROSTATE (TURP);  Surgeon: Hollice Espy, MD;  Location: [facility];  Service: Urology;  Laterality: N/A;    There were no vitals filed for this visit.   Subjective Assessment - 11/23/20  1458     Subjective  Doing little better with pain and motion -and able to turn doorknob and splint I could keep on about 4 hours at night time    Pertinent History Martin Adkins is a 75 y.o. male who  had suture removal on 10/30/20, - Had on 11/18/20 release of Dupuytren's contracture from the right little finger. Surgery was performed by Dr. Roland Rack. Overall the patient feels that he is doing well, he does still report moderate swelling in the right hand and refer to OT    Patient Stated Goals I want to get the scar better so I can make fist and open my hand to get my hand in my pocket, pick up and hold objects - and push and pull without pain    Currently in Pain? Yes    Pain Score 4     Pain Location Hand    Pain Orientation Left    Pain Descriptors / Indicators Aching;Tender;Throbbing;Tightness    Pain Type Surgical pain    Pain Onset More than a month ago                [facility] OT Assessment - 11/23/20  0001       Right Hand AROM   R Ring  MCP 0-90 85 Degrees    R Ring PIP 0-100 95 Degrees    R Little  MCP 0-90 85 Degrees   -20 coming in   R Little PIP 0-100 70 Degrees   75 in session                     OT Treatments/Exercises (OP) - 11/23/20 0001       RUE Paraffin   Number Minutes Paraffin 8 Minutes    RUE Paraffin Location Hand    Comments prior to soft tissue and ROM             Fabricate hand base extention splint for 4th and 5th digits at 0 degrees for night time use  earlier this week- pt wearing about 4 hours at night and some during day when watching tv scar massage  done by OT manual and using mini massager - cica scar pad for night time  and then also some use during day silicon sleeve for 5th digit  Cont  isotoner glove night time and some during day  Pt to do at home contrast 2-3 x day HEP   Rolling foam roller for extention and scar massage 20 reps And can do table slides for composite extention - tolerate well - but reinforce no weight bearing 20 reps Tendon glides AAROM  for [facility] and intrinsic fist with AAROM into extention of digits Gentle PROM to DIP and PIP of 5th  and gentle composite - stretch less than 2/10   Fisting but keep pain in 5th MC less than 2-3/10          OT Short Term Goals - 11/15/20 1842       OT SHORT TERM GOAL #1   Title Pt to be independent in scar management to decrease tenderness and increase AROM in R digits with decrease pain    Baseline scar tender at [facility] and proximal crease and pain increase to 3-5/10 at the most - scar thick and scabs still on 3 places - decrease ext and flexion    Time 3    Period Weeks    Status New    Target Date 12/06/20  OT Long Term Goals - 11/15/20 1844       OT LONG TERM GOAL #1   Title R grip and prehension strength increase to more than 75% compare to L hand to return to pull ,push door , squeeze washcloth without increase symptoms    Baseline grip NT -  decrease AROM -and 4 wks s/p    Time 6    Period Weeks    Status New    Target Date 12/27/20      OT LONG TERM GOAL #2   Title R digits AROM increase to [facility] to touch palm to turn doorknob, grip steering wheel and utencils    Baseline Flexion at MC's 80's and PIP at 5th 55 degrees, 4th 85 degrees- unable to grip cylinder objects    Time 6    Period Weeks    Status New    Target Date 12/27/20      OT LONG TERM GOAL #3   Title Pt able to maintain extention of 4th and 5th  MC and PIP without splint for month    Baseline MC's extention 5th -30 and 4th -15 degrees - scabs still thick    Time 6    Period Weeks    Status New    Target Date 12/27/20                   Plan - 11/23/20 1521     Clinical Impression Statement Pt present at OT  s/p R dupuytrens release on palm and 5th digit into PIP crease  on 10/18/20 is about 5 1/2  wks s/p - Z scar thick  but able to tolerate textures, touch and tapping - but tender  and cont to have edema still present over [facility] and 5th digit-  but decrease since earlier this week. Pt with decrease extention and flexion in digits  but improved in flexion and extention at 4th and 5th with wearing  hand base extention splint for 4th and 5th for night time use and some during day since earlier this week - cont to show increase scar tissue and tenderness in 5th MC and scar - with decrease strenght and functional use of dominant R hand in ADlL's and IADl's - pt can benefit from OT services to return to independent [place] of R dominant hand.    OT Occupational Profile and History Problem Focused Assessment - Including review of records relating to presenting problem    Occupational performance deficits (Please refer to evaluation for details): ADL's;IADL's;Play;Leisure    Body Structure / Function / Physical Skills ADL;Flexibility;ROM;UE functional use;Scar mobility;Edema;Pain;Strength;IADL    Rehab Potential Good    Clinical Decision Making Limited treatment  options, no task modification necessary    Comorbidities Affecting Occupational Performance: None    Modification or Assistance to Complete Evaluation  No modification of tasks or assist necessary to complete eval    OT Frequency 2x / week    OT Duration 6 weeks    OT Treatment/Interventions Self-care/ADL training;Patient/family education;Splinting;Paraffin;Fluidtherapy;Contrast Bath;Manual Therapy;Passive range of motion;Scar mobilization;Moist Heat    Consulted and Agree with Plan of Care Patient             Patient will benefit from skilled therapeutic intervention in order to improve the following deficits and impairments:   Body Structure / Function / Physical Skills: ADL, Flexibility, ROM, UE functional use, Scar mobility, Edema, Pain, Strength, IADL       Visit Diagnosis: Muscle weakness (generalized)  Scar condition  and fibrosis of skin  Stiffness of right hand, not elsewhere classified  Stiffness of left hand, not elsewhere classified    Problem List Patient Active Problem List   Diagnosis Date Noted   Carotid stenosis 09/26/2020   Primary neuroendocrine carcinoma of rectum ([place]) 08/22/2020   Dupuytren's contracture of left hand 11/02/2018   Sepsis ([place]) 08/27/2018   Non-ST elevation MI (NSTEMI) ([place]) 01/18/2018   AAA (abdominal aortic aneurysm) 09/24/2017   Leaking abdominal aortic aneurysm (AAA) 08/22/2017   CLL (chronic lymphocytic leukemia) ([place]) 05/24/2017   Goals of care, counseling/discussion 05/24/2017   B12 deficiency 03/21/2017   Elevated troponin I level 03/03/2015   Deep venous thrombosis of profunda femoris vein ([place]) 01/23/2015   BPH (benign prostatic hyperplasia) 01/23/2015   Urge urinary incontinence 01/23/2015   Type 2 diabetes mellitus ([place]) 10/31/2014   Benign gastric polyp 10/31/2014   Absence of bladder continence 10/09/2014   Acute non-ST elevation myocardial infarction (NSTEMI) ([place]) 08/15/2014   Atrial fibrillation ([place]) 08/05/2014    Antiphospholipid syndrome ([place]) 08/01/2014   H/O deep venous thrombosis 08/01/2014   Non-ST elevation myocardial infarction (NSTEMI), subendocardial infarction, subsequent episode of care ([place]) 07/30/2014   Barrett esophagus 07/28/2014   Diabetes mellitus, type 2 ([place]) 07/28/2014   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/28/2014   Hyperplastic adenomatous polyp of stomach 07/28/2014   Benign essential HTN 65/68/1275   Chronic systolic heart failure ([place]) 04/19/2014   AAA (abdominal aortic aneurysm) without rupture 04/05/2014   Arteriosclerosis of coronary artery 04/05/2014   Breathlessness on exertion 04/05/2014   Abdominal aortic aneurysm (AAA) without rupture 04/05/2014   Acute non-ST segment elevation myocardial infarction ([place]) 05/14/2013   Acid reflux 05/04/2013   Combined fat and carbohydrate induced hyperlipemia 05/03/2013   Obstructive apnea 05/03/2013   Arthritis, degenerative 05/03/2013   Peripheral blood vessel disorder ([place]) 05/03/2013   Arthritis or polyarthritis, rheumatoid ([place]) 05/03/2013   Compulsive tobacco user syndrome 05/03/2013   Rheumatoid arthritis ([place]) 05/03/2013   Peripheral vascular disease ([place]) 05/03/2013   Current tobacco use 05/03/2013    Rosalyn Gess, OTR/L,CLT 11/23/2020, 3:25 PM  [facility] [address], [place], [address] Phone: [phone]   Fax:  [phone]  Name: Martin Adkins MRN: [id number] Date of Birth: [birth year]

## 2020-11-28 ENCOUNTER — Ambulatory Visit: Payer: Medicare PPO | Admitting: Occupational Therapy

## 2020-11-28 DIAGNOSIS — M6281 Muscle weakness (generalized): Secondary | ICD-10-CM | POA: Diagnosis not present

## 2020-11-28 DIAGNOSIS — M25641 Stiffness of right hand, not elsewhere classified: Secondary | ICD-10-CM

## 2020-11-28 DIAGNOSIS — M25642 Stiffness of left hand, not elsewhere classified: Secondary | ICD-10-CM

## 2020-11-28 DIAGNOSIS — L905 Scar conditions and fibrosis of skin: Secondary | ICD-10-CM

## 2020-11-28 NOTE — Therapy (Signed)
[facility] [address], [place], [address] Phone: [phone]   Fax:  [phone]  Occupational Therapy Treatment  Patient Details  Name: Martin Adkins MRN: [id number] Date of Birth: [birth year] Referring Provider (OT): DR Poggi   Encounter Date: 11/28/2020   OT End of Session - 11/28/20 0943     Visit Number 4    Number of Visits 12    Date for OT Re-Evaluation 12/27/20    OT Start Time 0900    OT Stop Time 0930    OT Time Calculation (min) 30 min    Activity Tolerance Patient tolerated treatment well    Behavior During Therapy [facility] for tasks assessed/performed             Past Medical History:  Diagnosis Date   AAA (abdominal aortic aneurysm) without rupture    a.) [place] 02/09/04 - 3.1 x 3.4 cm. b.) [place] 02/14/05 - 3.62 x 3.63 cm. c.) [place] 02/25/06 - 3.97 x 4.0 cm. d.) CT 07/09/11 - 4.9 cm. e.) s/p EVAR 2013. f.) enlarging AAA --> back to the OR on 09/24/2017 for placement of a 27 mm x 12 cm RIGHT iliac extension limb down to distal CIA.   Aortic atherosclerosis (HCC)    APS (antiphospholipid syndrome) (HCC)    Arthritis    Atrial fibrillation (HCC)    a.) CHA2DS2-VASc Score = 6 (age, CHF, HTN, prior DVT x 2, prior MI). b.) on rivaroxaban   B12 deficiency 03/21/2017   Barrett's esophagus    BPH (benign prostatic hyperplasia)    CAD (coronary artery disease)    a.) LHC 11/14/95 --> 95% mLCx, 95% OM1; PTCA. b.) 4v CABG 2004 --> LIMA-LAD, SVG-D1,RCA,OM. c.) NSTEMI 05/02/2013 --> PCI with DES x 3 to SVG-OM in 2 distinct areas with filter. d.) Inf STEMI 07/30/14 - LHC --> 50% LM, 90% mLCx, 100% OM2, 100% RPDA; LIMA-LAD patent; 100% occ of SVG OM2,OM3,PDA; med mgmt. e.) LHC 01/19/18 - patent LIMA-LAD; other grafts occ; not amenable to PCI/further bypass.   Cardiomyopathy ([place])    Chronic anticoagulation    a.) Rivaroxaban   CLL (chronic lymphocytic leukemia) ([place]) 05/24/2017   DVT (deep venous  thrombosis) (HCC)    GERD (gastroesophageal reflux disease)    HFrEF (heart failure with reduced ejection fraction) ([place])    a.) TTE 02/22/2020 --> mod. LV systolic dysfunction; LVEF 35-40%.   History of hiatal hernia    History of shingles 2013   Hyperlipemia    Hyperplastic polyps of stomach    a.) Bx on 07/24/2020 --> well differentiated NET; WHO grade I.   Hypertension    NSTEMI (non-ST elevated myocardial infarction) ([place]) 05/02/2013   a.) LHC 05/03/2013 --> 50% LM, 100% LAD, 75% mLCx, 100% dLCx, 60% mRCA; patient LIMA-LAD, occluded SVG-D1 and SVG-PDA. SVG-OM with extensive thrombus and distal 99% lesion. HIGH RISK PCI performed at [facility] with placement of DES x 3 to 2 distinct areas of SVG with filter protection.   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 No intervention; medical management.   Valvular regurgitation    a.) TTE 02/22/2020 --> LVEF 35-40%; mild panvalvular.    Past Surgical History:  Procedure Laterality Date   ABDOMINAL AORTA STENT     CHOLECYSTECTOMY     COLONOSCOPY  11/24/2009   COLONOSCOPY N/A 10/09/2020   Procedure: COLONOSCOPY;  Surgeon: Mansouraty, Telford Nab., MD;  Location: [facility];  Service: Gastroenterology;  Laterality: N/A;   COLONOSCOPY WITH PROPOFOL N/A 07/24/2020   Procedure: COLONOSCOPY WITH PROPOFOL;  Surgeon: Toledo, Benay Pike, MD;  Location: [facility];   Service: Gastroenterology;  Laterality: N/A;   CORONARY ANGIOPLASTY WITH STENT PLACEMENT Left 05/03/2013   Procedure: CORONARY ANGIOPLASTY WITH STENT PLACEMENT (DES x 3 to SVG-OM graft); Location: [facility]   CORONARY ARTERY BYPASS GRAFT N/A 01/21/2002   Procedure: 4V CABG (LIMA-LAD, SVG-D1, SVG-RCA, SVG-OM); Location: [facility]   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   Procedure: DUPUYTREN CONTRACTURE RELEASE LEFT LONG FINGER;  Surgeon: Corky Mull, MD;  Location: [facility];  Service: Orthopedics;  Laterality: Left;   DUPUYTREN CONTRACTURE RELEASE Right 10/18/2020   Procedure: DUPUYTREN CONTRACTURE RELEASE;  Surgeon: Corky Mull, MD;  Location: [facility];  Service: Orthopedics;  Laterality: Right;   ENDOVASCULAR REPAIR/STENT GRAFT N/A 09/24/2017   Procedure: ENDOVASCULAR REPAIR/STENT GRAFT (27 mm x 12 cm RIGHT iliac limb extension to distal CIA);  Surgeon: Algernon Huxley, MD;  Location: [place] [facility];  Service: Cardiovascular;  Laterality: N/A;   ESOPHAGOGASTRODUODENOSCOPY  05/26/02  03/23/12   ESOPHAGOGASTRODUODENOSCOPY (EGD) WITH PROPOFOL N/A 10/28/2016   Procedure: ESOPHAGOGASTRODUODENOSCOPY (EGD) WITH PROPOFOL;  Surgeon: Manya Silvas, MD;  Location: [facility] END[facility]COPY;  Service: Endoscopy;  Laterality: N/A;   EUS N/A 10/09/2020   Procedure: LOWER ENDOSCOPIC ULTRASOUND (EUS);  Surgeon: Irving Copas., MD;  Location: [facility];  Service: Gastroenterology;  Laterality: N/A;   INCISION AND DRAINAGE ABSCESS N/A 06/14/2020   Procedure: INCISION AND DRAINAGE ABSCESS;  Surgeon: Herbert Pun, MD;  Location: [facility];  Service: General;  Laterality: N/A;   INGUINAL HERNIA REPAIR Left    LEFT HEART CATH AND CORONARY ANGIOGRAPHY N/A 01/19/2018   Procedure: LEFT HEART CATH AND CORONARY ANGIOGRAPHY;  Surgeon: Teodoro Spray, MD;  Location: [place] [facility];  Service: Cardiovascular;  Laterality: N/A;   LEFT HEART CATH AND CORONARY ANGIOGRAPHY Left 11/14/1995   Procedure:  CARDIAC CATHETERIZATION (PTCA); Location: [facility]; Surgeon: Doristine Bosworth, MD   LEFT HEART CATH AND CORS/GRAFTS ANGIOGRAPHY Left 07/30/2014   Procedure: CARDIAC CATHETERIZATION; Location: [facility]   PERIPHERAL VASCULAR CATHETERIZATION N/A 09/06/2014   Procedure: IVC Filter Removal;  Surgeon: Katha Cabal, MD;  Location: [facility];  Service: Cardiovascular;  Laterality: N/A;   POLYPECTOMY  10/09/2020   Procedure: POLYPECTOMY;  Surgeon: Rush Landmark Telford Nab., MD;  Location: [facility];  Service: Gastroenterology;;   SUBMUCOSAL LIFTING INJECTION  10/09/2020   Procedure: SUBMUCOSAL LIFTING INJECTION;  Surgeon: Irving Copas., MD;  Location: [facility];  Service: Gastroenterology;;   TRANSURETHRAL RESECTION OF PROSTATE N/A 02/20/2015   Procedure: TRANSURETHRAL RESECTION OF THE PROSTATE (TURP);  Surgeon: Hollice Espy, MD;  Location: [facility];  Service: Urology;  Laterality: N/A;    There were no vitals filed for this visit.   Subjective Assessment - 11/28/20  0902     Subjective  I can tell it is getting better- doing the massage , splint -and using my hand more    Pertinent History Maclovio Soules is a 75 y.o. male who  had suture removal on 10/30/20, - Had on 11/18/20 release of Dupuytren's contracture from the right little finger. Surgery was performed by Dr. Roland Rack. Overall the patient feels that he is doing well, he does still report moderate swelling in the right hand and refer to OT    Patient Stated Goals I want to get the scar better so I can make fist and open my hand to get my hand in my pocket, pick up and hold objects - and push and pull without pain    Currently in Pain? Yes    Pain Score 2     Pain Location Hand    Pain Orientation Right    Pain Descriptors / Indicators Tender;Tightness    Pain Type Surgical pain    Pain Onset More than a month ago    Pain Frequency Intermittent                [facility] OT Assessment - 11/28/20 0001       Right  Hand AROM   R Little  MCP 0-90 90 Degrees   -15 and PROM 0   R Little PIP 0-100 70 Degrees                      OT Treatments/Exercises (OP) - 11/28/20 0001       RUE Paraffin   Number Minutes Paraffin 8 Minutes    RUE Paraffin Location Hand    Comments prior to soft tissue and PROM                Cont to wear hand base extention splint for 4th and 5th digits at 0 degrees for night time - pt wearing about 4 hours at night and some during day when watching tv scar massage  done by OT manual and using mini massager  over cica scar pad on distal and volar 5th digit- cica scar pad for night time  and then also some use during day - silicon sleeve for 5th digit  Cont  isotoner glove night time and some during day  Pt to do at home contrast 2-3 x day HEP   Rolling foam roller for extention and scar massage 20 reps and relax into hyper extention And can do table slides for composite extention - tolerate well - but reinforce no weight bearing 20 reps Tendon glides AAROM  for [facility] and intrinsic fist with AAROM into extention of digits Gentle PROM to DIP and PIP of 5th  and gentle composite to foam roller and then marker  - stretch less than 2/10   Fisting but keep pain in 5th MC less than 2-3/10           OT Education - 11/28/20 0943     Education Details progress and changes to HEP    Person(s) Educated Patient    Methods Explanation;Demonstration;Tactile cues;Verbal cues;Handout    Comprehension Verbal cues required;Returned demonstration;Verbalized understanding              OT Short Term Goals - 11/15/20 1842       OT SHORT TERM GOAL #1   Title Pt to be independent in scar management to decrease tenderness and increase AROM in R digits with decrease pain    Baseline  scar tender at [facility], [facility] and proximal crease and pain increase to 3-5/10 at the most - scar thick and scabs still on 3 places - decrease ext and flexion    Time 3    Period Weeks    Status New     Target Date 12/06/20               OT Long Term Goals - 11/15/20 1844       OT LONG TERM GOAL #1   Title R grip and prehension strength increase to more than 75% compare to L hand to return to pull ,push door , squeeze washcloth without increase symptoms    Baseline grip NT - decrease AROM -and 4 wks s/p    Time 6    Period Weeks    Status New    Target Date 12/27/20      OT LONG TERM GOAL #2   Title R digits AROM increase to [facility] to touch palm to turn doorknob, grip steering wheel and utencils    Baseline Flexion at MC's 80's and PIP at 5th 55 degrees, 4th 85 degrees- unable to grip cylinder objects    Time 6    Period Weeks    Status New    Target Date 12/27/20      OT LONG TERM GOAL #3   Title Pt able to maintain extention of 4th and 5th  MC and PIP without splint for month    Baseline MC's extention 5th -30 and 4th -15 degrees - scabs still thick    Time 6    Period Weeks    Status New    Target Date 12/27/20                   Plan - 11/28/20 0943     Clinical Impression Statement Pt present at OT  s/p R dupuytrens release on palm and 5th digit into PIP crease  on 10/18/20 is about 6  wks s/p - Z scar still thick and tight  but able to tolerate textures, touch and tapping - cont to have some edema  over 5th  MC-  but decrease since earlier this week. Pt with decrease extention and flexion in 4th and 5th digits  but improving- cont to wear   hand base extention splint for 4th and 5th for night time  and some during day  - cont to show increase scar tissue and tenderness in 5th MC and scar - with decrease AROM and strenght/ functional use of dominant R hand in ADlL's and IADl's - pt can benefit from OT services to return to independent [place] of R dominant hand.    OT Occupational Profile and History Problem Focused Assessment - Including review of records relating to presenting problem    Occupational performance deficits (Please refer to evaluation for details):  ADL's;IADL's;Play;Leisure    Body Structure / Function / Physical Skills ADL;Flexibility;ROM;UE functional use;Scar mobility;Edema;Pain;Strength;IADL    Rehab Potential Good    Clinical Decision Making Limited treatment options, no task modification necessary    Comorbidities Affecting Occupational Performance: None    Modification or Assistance to Complete Evaluation  No modification of tasks or assist necessary to complete eval    OT Frequency 2x / week    OT Duration 6 weeks    OT Treatment/Interventions Self-care/ADL training;Patient/family education;Splinting;Paraffin;Fluidtherapy;Contrast Bath;Manual Therapy;Passive range of motion;Scar mobilization;Moist Heat    Consulted and Agree with Plan of Care Patient  Patient will benefit from skilled therapeutic intervention in order to improve the following deficits and impairments:   Body Structure / Function / Physical Skills: ADL, Flexibility, ROM, UE functional use, Scar mobility, Edema, Pain, Strength, IADL       Visit Diagnosis: Muscle weakness (generalized)  Scar condition and fibrosis of skin  Stiffness of right hand, not elsewhere classified  Stiffness of left hand, not elsewhere classified    Problem List Patient Active Problem List   Diagnosis Date Noted   Carotid stenosis 09/26/2020   Primary neuroendocrine carcinoma of rectum ([place]) 08/22/2020   Dupuytren's contracture of left hand 11/02/2018   Sepsis ([place]) 08/27/2018   Non-ST elevation MI (NSTEMI) ([place]) 01/18/2018   AAA (abdominal aortic aneurysm) 09/24/2017   Leaking abdominal aortic aneurysm (AAA) 08/22/2017   CLL (chronic lymphocytic leukemia) ([place]) 05/24/2017   Goals of care, counseling/discussion 05/24/2017   B12 deficiency 03/21/2017   Elevated troponin I level 03/03/2015   Deep venous thrombosis of profunda femoris vein ([place]) 01/23/2015   BPH (benign prostatic hyperplasia) 01/23/2015   Urge urinary incontinence 01/23/2015   Type 2  diabetes mellitus ([place]) 10/31/2014   Benign gastric polyp 10/31/2014   Absence of bladder continence 10/09/2014   Acute non-ST elevation myocardial infarction (NSTEMI) ([place]) 08/15/2014   Atrial fibrillation ([place]) 08/05/2014   Antiphospholipid syndrome ([place]) 08/01/2014   H/O deep venous thrombosis 08/01/2014   Non-ST elevation myocardial infarction (NSTEMI), subendocardial infarction, subsequent episode of care ([place]) 07/30/2014   Barrett esophagus 07/28/2014   Diabetes mellitus, type 2 ([place]) 07/28/2014   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/28/2014   Hyperplastic adenomatous polyp of stomach 07/28/2014   Benign essential HTN 12/45/8099   Chronic systolic heart failure ([place]) 04/19/2014   AAA (abdominal aortic aneurysm) without rupture 04/05/2014   Arteriosclerosis of coronary artery 04/05/2014   Breathlessness on exertion 04/05/2014   Abdominal aortic aneurysm (AAA) without rupture 04/05/2014   Acute non-ST segment elevation myocardial infarction ([place]) 05/14/2013   Acid reflux 05/04/2013   Combined fat and carbohydrate induced hyperlipemia 05/03/2013   Obstructive apnea 05/03/2013   Arthritis, degenerative 05/03/2013   Peripheral blood vessel disorder ([place]) 05/03/2013   Arthritis or polyarthritis, rheumatoid ([place]) 05/03/2013   Compulsive tobacco user syndrome 05/03/2013   Rheumatoid arthritis ([place]) 05/03/2013   Peripheral vascular disease ([place]) 05/03/2013   Current tobacco use 05/03/2013    Rosalyn Gess, OTR/L,CLT 11/28/2020, 11:42 AM  [facility] [address], [place], [address] Phone: [phone]   Fax:  [phone]  Name: Martin Adkins MRN: [id number] Date of Birth: [birth year]

## 2020-12-05 ENCOUNTER — Ambulatory Visit: Payer: Medicare PPO | Admitting: Occupational Therapy

## 2020-12-06 ENCOUNTER — Telehealth: Payer: Self-pay | Admitting: Pharmacist

## 2020-12-06 NOTE — Telephone Encounter (Signed)
Oral Chemotherapy Pharmacist Encounter  Dispensed samples to patient:  Medication: Xarelto 20mg  Instructions: Take 1 tablet (20 mg total) by mouth daily with supper Quantity dispensed: 28 tablets Days supply: 28 days Manufacturer: [employer] Lot: [id number] (7 tablets) and [id number] (21 tablets) Exp: [id number] (10/2022) and [id number] (04/2022)  Darl Pikes, PharmD, BCPS, BCOP, CPP Hematology/Oncology Clinical Pharmacist Practitioner [facility] [phone]  12/06/2020 10:14 AM

## 2020-12-07 ENCOUNTER — Ambulatory Visit: Payer: Medicare PPO | Attending: Student | Admitting: Occupational Therapy

## 2020-12-07 DIAGNOSIS — M6281 Muscle weakness (generalized): Secondary | ICD-10-CM

## 2020-12-07 DIAGNOSIS — L905 Scar conditions and fibrosis of skin: Secondary | ICD-10-CM | POA: Diagnosis present

## 2020-12-07 DIAGNOSIS — M25641 Stiffness of right hand, not elsewhere classified: Secondary | ICD-10-CM

## 2020-12-07 DIAGNOSIS — M25642 Stiffness of left hand, not elsewhere classified: Secondary | ICD-10-CM | POA: Insufficient documentation

## 2020-12-07 NOTE — Therapy (Signed)
[facility] [address], [place], [address] Phone: [phone]   Fax:  [phone]  Occupational Therapy Treatment  Patient Details  Name: Martin Adkins MRN: [id number] Date of Birth: June 18, 1945 Referring Provider (OT): DR Poggi   Encounter Date: 12/07/2020   OT End of Session - 12/07/20 1007     Visit Number 5    Number of Visits 12    Date for OT Re-Evaluation 12/27/20    OT Start Time 0945    OT Stop Time 1021    OT Time Calculation (min) 36 min    Activity Tolerance Patient tolerated treatment well    Behavior During Therapy [facility] for tasks assessed/performed             Past Medical History:  Diagnosis Date   AAA (abdominal aortic aneurysm) without rupture    a.) [place] 02/09/04 - 3.1 x 3.4 cm. b.) [place] 02/14/05 - 3.62 x 3.63 cm. c.) [place] 02/25/06 - 3.97 x 4.0 cm. d.) CT 07/09/11 - 4.9 cm. e.) s/p EVAR 2013. f.) enlarging AAA --> back to the OR on 09/24/2017 for placement of a 27 mm x 12 cm RIGHT iliac extension limb down to distal CIA.   Aortic atherosclerosis (HCC)    APS (antiphospholipid syndrome) (HCC)    Arthritis    Atrial fibrillation (HCC)    a.) CHA2DS2-VASc Score = 6 (age, CHF, HTN, prior DVT x 2, prior MI). b.) on rivaroxaban   B12 deficiency 03/21/2017   Barrett's esophagus    BPH (benign prostatic hyperplasia)    CAD (coronary artery disease)    a.) LHC 11/14/95 --> 95% mLCx, 95% OM1; PTCA. b.) 4v CABG 2004 --> LIMA-LAD, SVG-D1,RCA,OM. c.) NSTEMI 05/02/2013 --> PCI with DES x 3 to SVG-OM in 2 distinct areas with filter. d.) Inf STEMI 07/30/14 - LHC --> 50% LM, 90% mLCx, 100% OM2, 100% RPDA; LIMA-LAD patent; 100% occ of SVG OM2,OM3,PDA; med mgmt. e.) LHC 01/19/18 - patent LIMA-LAD; other grafts occ; not amenable to PCI/further bypass.   Cardiomyopathy ([place])    Chronic anticoagulation    a.) Rivaroxaban   CLL (chronic lymphocytic leukemia) ([place]) 05/24/2017   DVT (deep venous  thrombosis) (HCC)    GERD (gastroesophageal reflux disease)    HFrEF (heart failure with reduced ejection fraction) ([place])    a.) TTE 02/22/2020 --> mod. LV systolic dysfunction; LVEF 35-40%.   History of hiatal hernia    History of shingles 2013   Hyperlipemia    Hyperplastic polyps of stomach    a.) Bx on 07/24/2020 --> well differentiated NET; WHO grade I.   Hypertension    NSTEMI (non-ST elevated myocardial infarction) ([place]) 05/02/2013   a.) LHC 05/03/2013 --> 50% LM, 100% LAD, 75% mLCx, 100% dLCx, 60% mRCA; patient LIMA-LAD, occluded SVG-D1 and SVG-PDA. SVG-OM with extensive thrombus and distal 99% lesion. HIGH RISK PCI performed at [facility] with placement of DES x 3 to 2 distinct areas of SVG with filter protection.   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 No intervention; medical management.   Valvular regurgitation    a.) TTE 02/22/2020 --> LVEF 35-40%; mild panvalvular.    Past Surgical History:  Procedure Laterality Date   ABDOMINAL AORTA STENT     CHOLECYSTECTOMY     COLONOSCOPY  11/24/2009   COLONOSCOPY N/A 10/09/2020   Procedure: COLONOSCOPY;  Surgeon: Mansouraty, Telford Nab., MD;  Location: [facility] ENDOSCOPY;  Service: Gastroenterology;  Laterality: N/A;   COLONOSCOPY WITH PROPOFOL N/A 07/24/2020   Procedure: COLONOSCOPY WITH PROPOFOL;  Surgeon: Toledo, Benay Pike, MD;  Location: [facility] ENDOSCOPY;   Service: Gastroenterology;  Laterality: N/A;   CORONARY ANGIOPLASTY WITH STENT PLACEMENT Left 05/03/2013   Procedure: CORONARY ANGIOPLASTY WITH STENT PLACEMENT (DES x 3 to SVG-OM graft); Location: [facility]   CORONARY ARTERY BYPASS GRAFT N/A 01/21/2002   Procedure: 4V CABG (LIMA-LAD, SVG-D1, SVG-RCA, SVG-OM); Location: [facility]   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   Procedure: DUPUYTREN CONTRACTURE RELEASE LEFT LONG FINGER;  Surgeon: Corky Mull, MD;  Location: [facility] ORS;  Service: Orthopedics;  Laterality: Left;   DUPUYTREN CONTRACTURE RELEASE Right 10/18/2020   Procedure: DUPUYTREN CONTRACTURE RELEASE;  Surgeon: Corky Mull, MD;  Location: [facility];  Service: Orthopedics;  Laterality: Right;   ENDOVASCULAR REPAIR/STENT GRAFT N/A 09/24/2017   Procedure: ENDOVASCULAR REPAIR/STENT GRAFT (27 mm x 12 cm RIGHT iliac limb extension to distal CIA);  Surgeon: Algernon Huxley, MD;  Location: [facility];  Service: Cardiovascular;  Laterality: N/A;   ESOPHAGOGASTRODUODENOSCOPY  05/26/02  03/23/12   ESOPHAGOGASTRODUODENOSCOPY (EGD) WITH PROPOFOL N/A 10/28/2016   Procedure: ESOPHAGOGASTRODUODENOSCOPY (EGD) WITH PROPOFOL;  Surgeon: Manya Silvas, MD;  Location: [facility] ENDOSCOPY;  Service: Endoscopy;  Laterality: N/A;   EUS N/A 10/09/2020   Procedure: LOWER ENDOSCOPIC ULTRASOUND (EUS);  Surgeon: Irving Copas., MD;  Location: [facility];  Service: Gastroenterology;  Laterality: N/A;   INCISION AND DRAINAGE ABSCESS N/A 06/14/2020   Procedure: INCISION AND DRAINAGE ABSCESS;  Surgeon: Herbert Pun, MD;  Location: [facility];  Service: General;  Laterality: N/A;   INGUINAL HERNIA REPAIR Left    LEFT HEART CATH AND CORONARY ANGIOGRAPHY N/A 01/19/2018   Procedure: LEFT HEART CATH AND CORONARY ANGIOGRAPHY;  Surgeon: Teodoro Spray, MD;  Location: [place] [facility];  Service: Cardiovascular;  Laterality: N/A;   LEFT HEART CATH AND CORONARY ANGIOGRAPHY Left 11/14/1995   Procedure:  CARDIAC CATHETERIZATION (PTCA); Location: [facility]; Surgeon: Doristine Bosworth, MD   LEFT HEART CATH AND CORS/GRAFTS ANGIOGRAPHY Left 07/30/2014   Procedure: CARDIAC CATHETERIZATION; Location: [facility]   PERIPHERAL VASCULAR CATHETERIZATION N/A 09/06/2014   Procedure: IVC Filter Removal;  Surgeon: Katha Cabal, MD;  Location: [facility];  Service: Cardiovascular;  Laterality: N/A;   POLYPECTOMY  10/09/2020   Procedure: POLYPECTOMY;  Surgeon: Rush Landmark Telford Nab., MD;  Location: [facility];  Service: Gastroenterology;;   SUBMUCOSAL LIFTING INJECTION  10/09/2020   Procedure: SUBMUCOSAL LIFTING INJECTION;  Surgeon: Irving Copas., MD;  Location: [facility];  Service: Gastroenterology;;   TRANSURETHRAL RESECTION OF PROSTATE N/A 02/20/2015   Procedure: TRANSURETHRAL RESECTION OF THE PROSTATE (TURP);  Surgeon: Hollice Espy, MD;  Location: [facility];  Service: Urology;  Laterality: N/A;    There were no vitals filed for this visit.   Subjective Assessment - 12/07/20  0948     Subjective  Seen Dr Roland Rack - looking good and focusing on bending pinkie - to cont with you and doing my homeprogram    Pertinent History Bejamin Mccartt is a 75 y.o. male who  had suture removal on 10/30/20, - Had on 11/18/20 release of Dupuytren's contracture from the right little finger. Surgery was performed by Dr. Roland Rack. Overall the patient feels that he is doing well, he does still report moderate swelling in the right hand and refer to OT    Patient Stated Goals I want to get the scar better so I can make fist and open my hand to get my hand in my pocket, pick up and hold objects - and push and pull without pain    Currently in Pain? No/denies                [facility] OT Assessment - 12/07/20 0001       Strength   Right Hand Grip (lbs) 38    Right Hand Lateral Pinch 19 lbs    Right Hand 3 Point Pinch 13 lbs    Left Hand Grip (lbs) 70    Left Hand Lateral Pinch 18 lbs    Left Hand 3 Point Pinch  16 lbs      Right Hand AROM   R Little  MCP 0-90 90 Degrees   -12 ext   R Little PIP 0-100 75 Degrees   in session 80                     OT Treatments/Exercises (OP) - 12/07/20 0001       RUE Paraffin   Number Minutes Paraffin 8 Minutes    RUE Paraffin Location Hand    Comments prior to soft tissue  and scar massage             Cont to wear hand base extention splint for 4th and 5th digits at 0 degrees for night time - pt wearing about 4 hours at night and some during day when watching tv Pt to bring it in next session to adjust to full extention of 5th MC   scar massage done by OT manually and using mini massager  over cica scar pad on palmar  and volar 5th digit- cica scar pad for night time  and then also some use during day - silicon sleeve for 5th digit  Cont  isotoner glove night time and some during day  Pt to do at home contrast 2-3 x day HEP   Rolling foam roller for extention and scar massage 20 reps and relax into hyper extention And can do table slides for composite extention - tolerate well - but reinforce no weight bearing 20 reps  Gentle PROM to DIP and PIP of 5th  and gentle composite to one finger out of palm   - stretch less than 2/10  Then place and hold in composite fist   Fisting but keep pain in 5th MC less than 2-3/10          OT Education - 12/07/20 1007     Education Details progress and changes to HEP    Person(s) Educated Patient    Methods Explanation;Demonstration;Tactile cues;Verbal cues;Handout    Comprehension Verbal cues required;Returned demonstration;Verbalized understanding              OT Short Term Goals - 11/15/20 1842       OT SHORT TERM GOAL #1  Title Pt to be independent in scar management to decrease tenderness and increase AROM in R digits with decrease pain    Baseline scar tender at [facility], [facility] and proximal crease and pain increase to 3-5/10 at the most - scar thick and scabs still on 3 places - decrease  ext and flexion    Time 3    Period Weeks    Status New    Target Date 12/06/20               OT Long Term Goals - 11/15/20 1844       OT LONG TERM GOAL #1   Title R grip and prehension strength increase to more than 75% compare to L hand to return to pull ,push door , squeeze washcloth without increase symptoms    Baseline grip NT - decrease AROM -and 4 wks s/p    Time 6    Period Weeks    Status New    Target Date 12/27/20      OT LONG TERM GOAL #2   Title R digits AROM increase to [facility] to touch palm to turn doorknob, grip steering wheel and utencils    Baseline Flexion at MC's 80's and PIP at 5th 55 degrees, 4th 85 degrees- unable to grip cylinder objects    Time 6    Period Weeks    Status New    Target Date 12/27/20      OT LONG TERM GOAL #3   Title Pt able to maintain extention of 4th and 5th  MC and PIP without splint for month    Baseline MC's extention 5th -30 and 4th -15 degrees - scabs still thick    Time 6    Period Weeks    Status New    Target Date 12/27/20                   Plan - 12/07/20 1008     Clinical Impression Statement Pt present at OT  s/p R dupuytrens release on palm and 5th digit into PIP crease  on 10/18/20 is about 7 1/2  wks s/p - cont be limited by scar tissue , edema and pain at  5th PIP flexion. But making progress in scar tissue adhesions, increase extention of 5th pain and tenderness over scar and with functional use. Cont to be limited in 5th [facility] extention -12 this date - cont to wear  hand base extention splint for 4th and 5th for night time  and some during day  - cont to show thick scar tissue from volar 5th PIP to [facility], with decrease AROM in PIP flexion and MC extention - decrease grip strenght/ functional use of dominant R hand in ADL's and IADl's - pt can benefit from OT services to return to independent use of R dominant hand.    OT Occupational Profile and History Problem Focused Assessment - Including review of records  relating to presenting problem    Occupational performance deficits (Please refer to evaluation for details): ADL's;IADL's;Play;Leisure    Body Structure / Function / Physical Skills ADL;Flexibility;ROM;UE functional use;Scar mobility;Edema;Pain;Strength;IADL    Rehab Potential Good    Clinical Decision Making Limited treatment options, no task modification necessary    Comorbidities Affecting Occupational Performance: None    Modification or Assistance to Complete Evaluation  No modification of tasks or assist necessary to complete eval    OT Frequency 2x / week    OT Duration 6 weeks  OT Treatment/Interventions Self-care/ADL training;Patient/family education;Splinting;Paraffin;Fluidtherapy;Contrast Bath;Manual Therapy;Passive range of motion;Scar mobilization;Moist Heat    Consulted and Agree with Plan of Care Patient             Patient will benefit from skilled therapeutic intervention in order to improve the following deficits and impairments:   Body Structure / Function / Physical Skills: ADL, Flexibility, ROM, UE functional use, Scar mobility, Edema, Pain, Strength, IADL       Visit Diagnosis: Muscle weakness (generalized)  Scar condition and fibrosis of skin  Stiffness of right hand, not elsewhere classified    Problem List Patient Active Problem List   Diagnosis Date Noted   Carotid stenosis 09/26/2020   Primary neuroendocrine carcinoma of rectum ([place]) 08/22/2020   Dupuytren's contracture of left hand 11/02/2018   Sepsis ([place]) 08/27/2018   Non-ST elevation MI (NSTEMI) ([place]) 01/18/2018   AAA (abdominal aortic aneurysm) 09/24/2017   Leaking abdominal aortic aneurysm (AAA) 08/22/2017   CLL (chronic lymphocytic leukemia) ([place]) 05/24/2017   Goals of care, counseling/discussion 05/24/2017   B12 deficiency 03/21/2017   Elevated troponin I level 03/03/2015   Deep venous thrombosis of profunda femoris vein (HCC) 01/23/2015   BPH (benign prostatic hyperplasia)  01/23/2015   Urge urinary incontinence 01/23/2015   Type 2 diabetes mellitus ([place]) 10/31/2014   Benign gastric polyp 10/31/2014   Absence of bladder continence 10/09/2014   Acute non-ST elevation myocardial infarction (NSTEMI) ([place]) 08/15/2014   Atrial fibrillation ([place]) 08/05/2014   Antiphospholipid syndrome ([place]) 08/01/2014   H/O deep venous thrombosis 08/01/2014   Non-ST elevation myocardial infarction (NSTEMI), subendocardial infarction, subsequent episode of care ([place]) 07/30/2014   Barrett esophagus 07/28/2014   Diabetes mellitus, type 2 ([place]) 07/28/2014   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/28/2014   Hyperplastic adenomatous polyp of stomach 07/28/2014   Benign essential HTN 37/34/2876   Chronic systolic heart failure ([place]) 04/19/2014   AAA (abdominal aortic aneurysm) without rupture 04/05/2014   Arteriosclerosis of coronary artery 04/05/2014   Breathlessness on exertion 04/05/2014   Abdominal aortic aneurysm (AAA) without rupture 04/05/2014   Acute non-ST segment elevation myocardial infarction ([place]) 05/14/2013   Acid reflux 05/04/2013   Combined fat and carbohydrate induced hyperlipemia 05/03/2013   Obstructive apnea 05/03/2013   Arthritis, degenerative 05/03/2013   Peripheral blood vessel disorder ([place]) 05/03/2013   Arthritis or polyarthritis, rheumatoid ([place]) 05/03/2013   Compulsive tobacco user syndrome 05/03/2013   Rheumatoid arthritis ([place]) 05/03/2013   Peripheral vascular disease ([place]) 05/03/2013   Current tobacco use 05/03/2013    Rosalyn Gess, OTR/L,CLT 12/07/2020, 10:32 AM  [facility] [address], [place], [address] Phone: [phone]   Fax:  [phone]  Name: PLEZ BELTON MRN: [id number] Date of Birth: October 08, 1945

## 2020-12-14 ENCOUNTER — Ambulatory Visit: Payer: Medicare PPO | Admitting: Occupational Therapy

## 2020-12-14 DIAGNOSIS — M25641 Stiffness of right hand, not elsewhere classified: Secondary | ICD-10-CM

## 2020-12-14 DIAGNOSIS — M25642 Stiffness of left hand, not elsewhere classified: Secondary | ICD-10-CM

## 2020-12-14 DIAGNOSIS — M6281 Muscle weakness (generalized): Secondary | ICD-10-CM | POA: Diagnosis not present

## 2020-12-14 DIAGNOSIS — L905 Scar conditions and fibrosis of skin: Secondary | ICD-10-CM

## 2020-12-14 NOTE — Therapy (Signed)
[facility] [address], [place], [address] Phone: [phone]   Fax:  [phone]  Occupational Therapy Treatment  Patient Details  Name: Martin Adkins MRN: [id number] Date of Birth: January 07, 1946 Referring Provider (OT): DR Poggi   Encounter Date: 12/14/2020   OT End of Session - 12/14/20 1207     Visit Number 6    Number of Visits 12    Date for OT Re-Evaluation 12/27/20    OT Start Time 1152    OT Stop Time 1232    OT Time Calculation (min) 40 min    Activity Tolerance Patient tolerated treatment well    Behavior During Therapy [facility] for tasks assessed/performed             Past Medical History:  Diagnosis Date   AAA (abdominal aortic aneurysm) without rupture    a.) [place] 02/09/04 - 3.1 x 3.4 cm. b.) [place] 02/14/05 - 3.62 x 3.63 cm. c.) [place] 02/25/06 - 3.97 x 4.0 cm. d.) CT 07/09/11 - 4.9 cm. e.) s/p EVAR 2013. f.) enlarging AAA --> back to the OR on 09/24/2017 for placement of a 27 mm x 12 cm RIGHT iliac extension limb down to distal CIA.   Aortic atherosclerosis (HCC)    APS (antiphospholipid syndrome) (HCC)    Arthritis    Atrial fibrillation (HCC)    a.) CHA2DS2-VASc Score = 6 (age, CHF, HTN, prior DVT x 2, prior MI). b.) on rivaroxaban   B12 deficiency 03/21/2017   Barrett's esophagus    BPH (benign prostatic hyperplasia)    CAD (coronary artery disease)    a.) LHC 11/14/95 --> 95% mLCx, 95% OM1; PTCA. b.) 4v CABG 2004 --> LIMA-LAD, SVG-D1,RCA,OM. c.) NSTEMI 05/02/2013 --> PCI with DES x 3 to SVG-OM in 2 distinct areas with filter. d.) Inf STEMI 07/30/14 - LHC --> 50% LM, 90% mLCx, 100% OM2, 100% RPDA; LIMA-LAD patent; 100% occ of SVG OM2,OM3,PDA; med mgmt. e.) LHC 01/19/18 - patent LIMA-LAD; other grafts occ; not amenable to PCI/further bypass.   Cardiomyopathy ([place])    Chronic anticoagulation    a.) Rivaroxaban   CLL (chronic lymphocytic leukemia) ([place]) 05/24/2017   DVT (deep venous  thrombosis) (HCC)    GERD (gastroesophageal reflux disease)    HFrEF (heart failure with reduced ejection fraction) ([place])    a.) TTE 02/22/2020 --> mod. LV systolic dysfunction; LVEF 35-40%.   History of hiatal hernia    History of shingles 2013   Hyperlipemia    Hyperplastic polyps of stomach    a.) Bx on 07/24/2020 --> well differentiated NET; WHO grade I.   Hypertension    NSTEMI (non-ST elevated myocardial infarction) ([place]) 05/02/2013   a.) LHC 05/03/2013 --> 50% LM, 100% LAD, 75% mLCx, 100% dLCx, 60% mRCA; patient LIMA-LAD, occluded SVG-D1 and SVG-PDA. SVG-OM with extensive thrombus and distal 99% lesion. HIGH RISK PCI performed at [facility] with placement of DES x 3 to 2 distinct areas of SVG with filter protection.   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 No intervention; medical management.   Valvular regurgitation    a.) TTE 02/22/2020 --> LVEF 35-40%; mild panvalvular.    Past Surgical History:  Procedure Laterality Date   ABDOMINAL AORTA STENT     CHOLECYSTECTOMY     COLONOSCOPY  11/24/2009   COLONOSCOPY N/A 10/09/2020   Procedure: COLONOSCOPY;  Surgeon: Mansouraty, Telford Nab., MD;  Location: [facility];  Service: Gastroenterology;  Laterality: N/A;   COLONOSCOPY WITH PROPOFOL N/A 07/24/2020   Procedure: COLONOSCOPY WITH PROPOFOL;  Surgeon: Toledo, Benay Pike, MD;  Location: [facility] ENDOSCOPY;   Service: Gastroenterology;  Laterality: N/A;   CORONARY ANGIOPLASTY WITH STENT PLACEMENT Left 05/03/2013   Procedure: CORONARY ANGIOPLASTY WITH STENT PLACEMENT (DES x 3 to SVG-OM graft); Location: [facility]   CORONARY ARTERY BYPASS GRAFT N/A 01/21/2002   Procedure: 4V CABG (LIMA-LAD, SVG-D1, SVG-RCA, SVG-OM); Location: [facility]   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   Procedure: DUPUYTREN CONTRACTURE RELEASE LEFT LONG FINGER;  Surgeon: Corky Mull, MD;  Location: [facility];  Service: Orthopedics;  Laterality: Left;   DUPUYTREN CONTRACTURE RELEASE Right 10/18/2020   Procedure: DUPUYTREN CONTRACTURE RELEASE;  Surgeon: Corky Mull, MD;  Location: [facility];  Service: Orthopedics;  Laterality: Right;   ENDOVASCULAR REPAIR/STENT GRAFT N/A 09/24/2017   Procedure: ENDOVASCULAR REPAIR/STENT GRAFT (27 mm x 12 cm RIGHT iliac limb extension to distal CIA);  Surgeon: Algernon Huxley, MD;  Location: [facility];  Service: Cardiovascular;  Laterality: N/A;   ESOPHAGOGASTRODUODENOSCOPY  05/26/02  03/23/12   ESOPHAGOGASTRODUODENOSCOPY (EGD) WITH PROPOFOL N/A 10/28/2016   Procedure: ESOPHAGOGASTRODUODENOSCOPY (EGD) WITH PROPOFOL;  Surgeon: Manya Silvas, MD;  Location: [facility] ENDOSCOPY;  Service: Endoscopy;  Laterality: N/A;   EUS N/A 10/09/2020   Procedure: LOWER ENDOSCOPIC ULTRASOUND (EUS);  Surgeon: Irving Copas., MD;  Location: [facility];  Service: Gastroenterology;  Laterality: N/A;   INCISION AND DRAINAGE ABSCESS N/A 06/14/2020   Procedure: INCISION AND DRAINAGE ABSCESS;  Surgeon: Herbert Pun, MD;  Location: [facility];  Service: General;  Laterality: N/A;   INGUINAL HERNIA REPAIR Left    LEFT HEART CATH AND CORONARY ANGIOGRAPHY N/A 01/19/2018   Procedure: LEFT HEART CATH AND CORONARY ANGIOGRAPHY;  Surgeon: Teodoro Spray, MD;  Location: [facility];  Service: Cardiovascular;  Laterality: N/A;   LEFT HEART CATH AND CORONARY ANGIOGRAPHY Left 11/14/1995   Procedure:  CARDIAC CATHETERIZATION (PTCA); Location: [facility]; Surgeon: Doristine Bosworth, MD   LEFT HEART CATH AND CORS/GRAFTS ANGIOGRAPHY Left 07/30/2014   Procedure: CARDIAC CATHETERIZATION; Location: [facility]   PERIPHERAL VASCULAR CATHETERIZATION N/A 09/06/2014   Procedure: IVC Filter Removal;  Surgeon: Katha Cabal, MD;  Location: [facility];  Service: Cardiovascular;  Laterality: N/A;   POLYPECTOMY  10/09/2020   Procedure: POLYPECTOMY;  Surgeon: Rush Landmark Telford Nab., MD;  Location: [facility];  Service: Gastroenterology;;   SUBMUCOSAL LIFTING INJECTION  10/09/2020   Procedure: SUBMUCOSAL LIFTING INJECTION;  Surgeon: Irving Copas., MD;  Location: [facility];  Service: Gastroenterology;;   TRANSURETHRAL RESECTION OF PROSTATE N/A 02/20/2015   Procedure: TRANSURETHRAL RESECTION OF THE PROSTATE (TURP);  Surgeon: Hollice Espy, MD;  Location: [facility];  Service: Urology;  Laterality: N/A;    There were no vitals filed for this visit.   Subjective Assessment - 12/14/20  1206     Subjective  Doing okay- better- still tender and cannot wear the splint all night - but wear it some during day -cutting with knife still hard because cannot hold with pinkie tight and tender over scar    Pertinent History [place] Shorey is a 76 y.o. male who  had suture removal on 10/30/20, - Had on 11/18/20 release of Dupuytren's contracture from the right little finger. Surgery was performed by Dr. Roland Rack. Overall the patient feels that he is doing well, he does still report moderate swelling in the right hand and refer to OT    Patient Stated Goals I want to get the scar better so I can make fist and open my hand to get my hand in my pocket, pick up and hold objects - and push and pull without pain    Currently in Pain? Yes    Pain Score 4     Pain Location Hand    Pain Orientation Right    Pain Descriptors / Indicators Tightness;Tender    Pain Type Surgical pain    Pain Onset More than a month ago     Pain Frequency Intermittent    Aggravating Factors  gripping like knife                [facility] OT Assessment - 12/14/20 0001       Strength   Right Hand Grip (lbs) 45    Right Hand Lateral Pinch 19 lbs    Right Hand 3 Point Pinch 13 lbs    Left Hand Grip (lbs) 70    Left Hand Lateral Pinch 18 lbs    Left Hand 3 Point Pinch 16 lbs      Right Hand AROM   R Little  MCP 0-90 90 Degrees    R Little PIP 0-100 75 Degrees              Increase grip strength  And end of session -PROM 90 at PIP and AROM 85         OT Treatments/Exercises (OP) - 12/14/20 0001       RUE Paraffin   Number Minutes Paraffin 8 Minutes    RUE Paraffin Location Hand    Comments prior to soft tissue and ROM             Cont to wear hand base extention splint for 4th and 5th digits at 0 degrees for night time - pt wearing about 4 hours at night and some during day when watching tv Assess splint - still working well - to make sure strap goes over DIP of 5th   scar massage done by OT manually and using mini massager  over cica scar pad on palmar  and volar 5th digit- cica scar pad for night time  and then also some use during day - silicon sleeve for 5th digit  Focus this date at PIP to [facility] - where scar is tightness and adhere Cont  isotoner glove night time and some during day  Pt to do at home contrast 2-3 x day HEP   Rolling foam roller for extention and scar massage 20 reps and relax into hyper extention And can do table slides for composite extention - tolerate well - but reinforce no weight bearing 20 reps   Gentle PROM to DIP and PIP of 5th  and gentle composite to one finger out of palm   - stretch less than 2/10  Then place and hold  in composite fist at 85-90 PIP flexion this date and MC 90   Fisting but keep pain in 5th MC less than 2-3/10          OT Education - 12/14/20 1206     Education Details progress and changes to HEP    Person(s) Educated Patient    Methods  Explanation;Demonstration;Tactile cues;Verbal cues;Handout    Comprehension Verbal cues required;Returned demonstration;Verbalized understanding              OT Short Term Goals - 11/15/20 1842       OT SHORT TERM GOAL #1   Title Pt to be independent in scar management to decrease tenderness and increase AROM in R digits with decrease pain    Baseline scar tender at [facility], [facility] and proximal crease and pain increase to 3-5/10 at the most - scar thick and scabs still on 3 places - decrease ext and flexion    Time 3    Period Weeks    Status New    Target Date 12/06/20               OT Long Term Goals - 11/15/20 1844       OT LONG TERM GOAL #1   Title R grip and prehension strength increase to more than 75% compare to L hand to return to pull ,push door , squeeze washcloth without increase symptoms    Baseline grip NT - decrease AROM -and 4 wks s/p    Time 6    Period Weeks    Status New    Target Date 12/27/20      OT LONG TERM GOAL #2   Title R digits AROM increase to [facility] to touch palm to turn doorknob, grip steering wheel and utencils    Baseline Flexion at MC's 80's and PIP at 5th 55 degrees, 4th 85 degrees- unable to grip cylinder objects    Time 6    Period Weeks    Status New    Target Date 12/27/20      OT LONG TERM GOAL #3   Title Pt able to maintain extention of 4th and 5th  MC and PIP without splint for month    Baseline MC's extention 5th -30 and 4th -15 degrees - scabs still thick    Time 6    Period Weeks    Status New    Target Date 12/27/20                   Plan - 12/14/20 1207     Clinical Impression Statement Pt present at OT  s/p R dupuytrens release on palm and 5th digit into PIP crease  on 10/18/20 is about 8 1/2  wks s/p - cont be limited by scar tissue , edema , tenderness and pain at  5th PIP during flexion and extention. Making progress in scar tissue adhesions, increase extention and flexion of 5th with functional use. Cont to be  limited in 5th [facility] extention -15  - cont to wear  hand base extention splint for 4th and 5th for night time  and some during day  - did assess fit of splint -doing well -  cont to show thick scar tissue from volar 5th PIP to [facility], with decrease AROM in PIP flexion and MC extention -  Grip strength did increase as well as functional use of dominant R hand in ADL's and IADl's - pt can benefit from OT services to return to  independent use of R dominant hand.    OT Occupational Profile and History Problem Focused Assessment - Including review of records relating to presenting problem    Occupational performance deficits (Please refer to evaluation for details): ADL's;IADL's;Play;Leisure    Body Structure / Function / Physical Skills ADL;Flexibility;ROM;UE functional use;Scar mobility;Edema;Pain;Strength;IADL    Rehab Potential Good    Clinical Decision Making Limited treatment options, no task modification necessary    Modification or Assistance to Complete Evaluation  No modification of tasks or assist necessary to complete eval    OT Frequency 2x / week    OT Duration 6 weeks    OT Treatment/Interventions Self-care/ADL training;Patient/family education;Splinting;Paraffin;Fluidtherapy;Contrast Bath;Manual Therapy;Passive range of motion;Scar mobilization;Moist Heat    OT Home Exercise Plan see pt instruction    Consulted and Agree with Plan of Care Patient             Patient will benefit from skilled therapeutic intervention in order to improve the following deficits and impairments:   Body Structure / Function / Physical Skills: ADL, Flexibility, ROM, UE functional use, Scar mobility, Edema, Pain, Strength, IADL       Visit Diagnosis: Muscle weakness (generalized)  Scar condition and fibrosis of skin  Stiffness of right hand, not elsewhere classified  Stiffness of left hand, not elsewhere classified    Problem List Patient Active Problem List   Diagnosis Date Noted   Carotid  stenosis 09/26/2020   Primary neuroendocrine carcinoma of rectum ([place]) 08/22/2020   Dupuytren's contracture of left hand 11/02/2018   Sepsis ([place]) 08/27/2018   Non-ST elevation MI (NSTEMI) ([place]) 01/18/2018   AAA (abdominal aortic aneurysm) 09/24/2017   Leaking abdominal aortic aneurysm (AAA) 08/22/2017   CLL (chronic lymphocytic leukemia) ([place]) 05/24/2017   Goals of care, counseling/discussion 05/24/2017   B12 deficiency 03/21/2017   Elevated troponin I level 03/03/2015   Deep venous thrombosis of profunda femoris vein (HCC) 01/23/2015   BPH (benign prostatic hyperplasia) 01/23/2015   Urge urinary incontinence 01/23/2015   Type 2 diabetes mellitus ([place]) 10/31/2014   Benign gastric polyp 10/31/2014   Absence of bladder continence 10/09/2014   Acute non-ST elevation myocardial infarction (NSTEMI) ([place]) 08/15/2014   Atrial fibrillation ([place]) 08/05/2014   Antiphospholipid syndrome ([place]) 08/01/2014   H/O deep venous thrombosis 08/01/2014   Non-ST elevation myocardial infarction (NSTEMI), subendocardial infarction, subsequent episode of care ([place]) 07/30/2014   Barrett esophagus 07/28/2014   Diabetes mellitus, type 2 ([place]) 07/28/2014   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/28/2014   Hyperplastic adenomatous polyp of stomach 07/28/2014   Benign essential HTN 69/62/9528   Chronic systolic heart failure ([place]) 04/19/2014   AAA (abdominal aortic aneurysm) without rupture 04/05/2014   Arteriosclerosis of coronary artery 04/05/2014   Breathlessness on exertion 04/05/2014   Abdominal aortic aneurysm (AAA) without rupture 04/05/2014   Acute non-ST segment elevation myocardial infarction ([place]) 05/14/2013   Acid reflux 05/04/2013   Combined fat and carbohydrate induced hyperlipemia 05/03/2013   Obstructive apnea 05/03/2013   Arthritis, degenerative 05/03/2013   Peripheral blood vessel disorder ([place]) 05/03/2013   Arthritis or polyarthritis, rheumatoid ([place]) 05/03/2013   Compulsive tobacco user  syndrome 05/03/2013   Rheumatoid arthritis ([place]) 05/03/2013   Peripheral vascular disease ([place]) 05/03/2013   Current tobacco use 05/03/2013    Rosalyn Gess, OTR/L,CLT 12/14/2020, 1:13 PM  [facility] [address], [place], [address] Phone: [phone]   Fax:  [phone]  Name: CEBASTIAN NEIS MRN: [id number] Date of Birth:  02/09/1945  

## 2020-12-19 ENCOUNTER — Ambulatory Visit: Payer: Medicare PPO | Admitting: Occupational Therapy

## 2020-12-19 DIAGNOSIS — M25642 Stiffness of left hand, not elsewhere classified: Secondary | ICD-10-CM

## 2020-12-19 DIAGNOSIS — M25641 Stiffness of right hand, not elsewhere classified: Secondary | ICD-10-CM

## 2020-12-19 DIAGNOSIS — M6281 Muscle weakness (generalized): Secondary | ICD-10-CM | POA: Diagnosis not present

## 2020-12-19 DIAGNOSIS — L905 Scar conditions and fibrosis of skin: Secondary | ICD-10-CM

## 2020-12-19 NOTE — Therapy (Signed)
[place] [facility] [address], [place], [address] Phone: [phone]   Fax:  [phone]  Occupational Therapy Treatment  Patient Details  Name: Martin Adkins MRN: [id number] Date of Birth: Aug 07, 1945 Referring Provider (OT): DR Poggi   Encounter Date: 12/19/2020   OT End of Session - 12/19/20 0911     Visit Number 7    Number of Visits 12    Date for OT Re-Evaluation 12/27/20    OT Start Time 0900    OT Stop Time 0935    OT Time Calculation (min) 35 min    Activity Tolerance Patient tolerated treatment well    Behavior During Therapy [facility] for tasks assessed/performed             Past Medical History:  Diagnosis Date   AAA (abdominal aortic aneurysm) without rupture    a.) [place] 02/09/04 - 3.1 x 3.4 cm. b.) [place] 02/14/05 - 3.62 x 3.63 cm. c.) [place] 02/25/06 - 3.97 x 4.0 cm. d.) CT 07/09/11 - 4.9 cm. e.) s/p EVAR 2013. f.) enlarging AAA --> back to the OR on 09/24/2017 for placement of a 27 mm x 12 cm RIGHT iliac extension limb down to distal CIA.   Aortic atherosclerosis (HCC)    APS (antiphospholipid syndrome) (HCC)    Arthritis    Atrial fibrillation (HCC)    a.) CHA2DS2-VASc Score = 6 (age, CHF, HTN, prior DVT x 2, prior MI). b.) on rivaroxaban   B12 deficiency 03/21/2017   Barrett's esophagus    BPH (benign prostatic hyperplasia)    CAD (coronary artery disease)    a.) LHC 11/14/95 --> 95% mLCx, 95% OM1; PTCA. b.) 4v CABG 2004 --> LIMA-LAD, SVG-D1,RCA,OM. c.) NSTEMI 05/02/2013 --> PCI with DES x 3 to SVG-OM in 2 distinct areas with filter. d.) Inf STEMI 07/30/14 - LHC --> 50% LM, 90% mLCx, 100% OM2, 100% RPDA; LIMA-LAD patent; 100% occ of SVG OM2,OM3,PDA; med mgmt. e.) LHC 01/19/18 - patent LIMA-LAD; other grafts occ; not amenable to PCI/further bypass.   Cardiomyopathy ([place])    Chronic anticoagulation    a.) Rivaroxaban   CLL (chronic lymphocytic leukemia) ([place]) 05/24/2017   DVT (deep venous  thrombosis) (HCC)    GERD (gastroesophageal reflux disease)    HFrEF (heart failure with reduced ejection fraction) ([place])    a.) TTE 02/22/2020 --> mod. LV systolic dysfunction; LVEF 35-40%.   History of hiatal hernia    History of shingles 2013   Hyperlipemia    Hyperplastic polyps of stomach    a.) Bx on 07/24/2020 --> well differentiated NET; WHO grade I.   Hypertension    NSTEMI (non-ST elevated myocardial infarction) ([place]) 05/02/2013   a.) LHC 05/03/2013 --> 50% LM, 100% LAD, 75% mLCx, 100% dLCx, 60% mRCA; patient LIMA-LAD, occluded SVG-D1 and SVG-PDA. SVG-OM with extensive thrombus and distal 99% lesion. HIGH RISK PCI performed at [facility] with placement of DES x 3 to 2 distinct areas of SVG with filter protection.   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 No intervention; medical management.   Valvular regurgitation    a.) TTE 02/22/2020 --> LVEF 35-40%; mild panvalvular.    Past Surgical History:  Procedure Laterality Date   ABDOMINAL AORTA STENT     CHOLECYSTECTOMY     COLONOSCOPY  11/24/2009   COLONOSCOPY N/A 10/09/2020   Procedure: COLONOSCOPY;  Surgeon: Mansouraty, Telford Nab., MD;  Location: [facility];  Service: Gastroenterology;  Laterality: N/A;   COLONOSCOPY WITH PROPOFOL N/A 07/24/2020   Procedure: COLONOSCOPY WITH PROPOFOL;  Surgeon: Toledo, Benay Pike, MD;  Location: [facility] ENDOSCOPY;   Service: Gastroenterology;  Laterality: N/A;   CORONARY ANGIOPLASTY WITH STENT PLACEMENT Left 05/03/2013   Procedure: CORONARY ANGIOPLASTY WITH STENT PLACEMENT (DES x 3 to SVG-OM graft); Location: [facility]   CORONARY ARTERY BYPASS GRAFT N/A 01/21/2002   Procedure: 4V CABG (LIMA-LAD, SVG-D1, SVG-RCA, SVG-OM); Location: [facility]   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   Procedure: DUPUYTREN CONTRACTURE RELEASE LEFT LONG FINGER;  Surgeon: Corky Mull, MD;  Location: [facility] ORS;  Service: Orthopedics;  Laterality: Left;   DUPUYTREN CONTRACTURE RELEASE Right 10/18/2020   Procedure: DUPUYTREN CONTRACTURE RELEASE;  Surgeon: Corky Mull, MD;  Location: [facility];  Service: Orthopedics;  Laterality: Right;   ENDOVASCULAR REPAIR/STENT GRAFT N/A 09/24/2017   Procedure: ENDOVASCULAR REPAIR/STENT GRAFT (27 mm x 12 cm RIGHT iliac limb extension to distal CIA);  Surgeon: Algernon Huxley, MD;  Location: [facility];  Service: Cardiovascular;  Laterality: N/A;   ESOPHAGOGASTRODUODENOSCOPY  05/26/02  03/23/12   ESOPHAGOGASTRODUODENOSCOPY (EGD) WITH PROPOFOL N/A 10/28/2016   Procedure: ESOPHAGOGASTRODUODENOSCOPY (EGD) WITH PROPOFOL;  Surgeon: Manya Silvas, MD;  Location: [facility] ENDOSCOPY;  Service: Endoscopy;  Laterality: N/A;   EUS N/A 10/09/2020   Procedure: LOWER ENDOSCOPIC ULTRASOUND (EUS);  Surgeon: Irving Copas., MD;  Location: [facility];  Service: Gastroenterology;  Laterality: N/A;   INCISION AND DRAINAGE ABSCESS N/A 06/14/2020   Procedure: INCISION AND DRAINAGE ABSCESS;  Surgeon: Herbert Pun, MD;  Location: [facility];  Service: General;  Laterality: N/A;   INGUINAL HERNIA REPAIR Left    LEFT HEART CATH AND CORONARY ANGIOGRAPHY N/A 01/19/2018   Procedure: LEFT HEART CATH AND CORONARY ANGIOGRAPHY;  Surgeon: Teodoro Spray, MD;  Location: [facility];  Service: Cardiovascular;  Laterality: N/A;   LEFT HEART CATH AND CORONARY ANGIOGRAPHY Left 11/14/1995   Procedure:  CARDIAC CATHETERIZATION (PTCA); Location: [facility]; Surgeon: Doristine Bosworth, MD   LEFT HEART CATH AND CORS/GRAFTS ANGIOGRAPHY Left 07/30/2014   Procedure: CARDIAC CATHETERIZATION; Location: [facility]   PERIPHERAL VASCULAR CATHETERIZATION N/A 09/06/2014   Procedure: IVC Filter Removal;  Surgeon: Katha Cabal, MD;  Location: [facility];  Service: Cardiovascular;  Laterality: N/A;   POLYPECTOMY  10/09/2020   Procedure: POLYPECTOMY;  Surgeon: Rush Landmark Telford Nab., MD;  Location: [facility];  Service: Gastroenterology;;   SUBMUCOSAL LIFTING INJECTION  10/09/2020   Procedure: SUBMUCOSAL LIFTING INJECTION;  Surgeon: Irving Copas., MD;  Location: [facility];  Service: Gastroenterology;;   TRANSURETHRAL RESECTION OF PROSTATE N/A 02/20/2015   Procedure: TRANSURETHRAL RESECTION OF THE PROSTATE (TURP);  Surgeon: Hollice Espy, MD;  Location: [facility];  Service: Urology;  Laterality: N/A;    There were no vitals filed for this visit.   Subjective Assessment - 12/19/20  8315     Subjective  Wearing my splint  mostly during day -and gripping object better- not as tender - working that scar still    Pertinent History Carliss Pitkin is a 75 y.o. male who  had suture removal on 10/30/20, - Had on 11/18/20 release of Dupuytren's contracture from the right little finger. Surgery was performed by Dr. Roland Rack. Overall the patient feels that he is doing well, he does still report moderate swelling in the right hand and refer to OT    Patient Stated Goals I want to get the scar better so I can make fist and open my hand to get my hand in my pocket, pick up and hold objects - and push and pull without pain    Currently in Pain? No/denies                [facility] OT Assessment - 12/19/20 0001       Right Hand AROM   R Little  MCP 0-90 90 Degrees   extention -10   R Little PIP 0-100 75 Degrees   SOC AROM , in session PROM 95 pain free-                     OT  Treatments/Exercises (OP) - 12/19/20 0001       RUE Paraffin   Number Minutes Paraffin 8 Minutes    RUE Paraffin Location Hand    Comments prior to scar massage             Cont to wear hand base extention splint for 4th and 5th digits at 0 degrees - per pt more during day than night time- when watching tv Assess splint last week- still working well - to make sure strap goes over DIP of 5th   scar massage done by OT manually and using mini massager  over cica scar pad on palmar  and volar 5th digit from PIP to [facility] focus this date - with great success - had increase PROM flexion pain free at PIP And increase extention at [place] scar pad for night time  and then also some use during day - silicon sleeve for 5th digit   Cont  isotoner glove night time and some during day  Pt to do at home contrast 2-3 x day HEP   Rolling foam roller for extention and scar massage 20 reps and relax into hyper extention And can do table slides for composite extention - tolerate well - but reinforce no weight bearing 20 reps   Gentle PROM to DIP and PIP of 5th  and gentle composite to   touching palm this date- pain less than 2/10  Then place and hold in composite fist at 85-90 PIP flexion and MC 90   Fisting but keep pain in 5th MC less than 2-3/10          OT Education - 12/19/20 0911     Education Details progress and changes to HEP    Person(s) Educated Patient    Methods Explanation;Demonstration;Tactile cues;Verbal cues;Handout    Comprehension Verbal cues required;Returned demonstration;Verbalized understanding              OT Short Term Goals - 11/15/20 1842       OT SHORT TERM GOAL #1   Title Pt to be independent in scar management to decrease tenderness and increase AROM in R digits with decrease pain    Baseline scar tender at [facility],  MC and proximal crease and pain increase to 3-5/10 at the most - scar thick and scabs still on 3 places - decrease ext and flexion    Time  3    Period Weeks    Status New    Target Date 12/06/20               OT Long Term Goals - 11/15/20 1844       OT LONG TERM GOAL #1   Title R grip and prehension strength increase to more than 75% compare to L hand to return to pull ,push door , squeeze washcloth without increase symptoms    Baseline grip NT - decrease AROM -and 4 wks s/p    Time 6    Period Weeks    Status New    Target Date 12/27/20      OT LONG TERM GOAL #2   Title R digits AROM increase to [facility] to touch palm to turn doorknob, grip steering wheel and utencils    Baseline Flexion at MC's 80's and PIP at 5th 55 degrees, 4th 85 degrees- unable to grip cylinder objects    Time 6    Period Weeks    Status New    Target Date 12/27/20      OT LONG TERM GOAL #3   Title Pt able to maintain extention of 4th and 5th  MC and PIP without splint for month    Baseline MC's extention 5th -30 and 4th -15 degrees - scabs still thick    Time 6    Period Weeks    Status New    Target Date 12/27/20                   Plan - 12/19/20 0911     Clinical Impression Statement Pt present at OT  s/p R dupuytrens release on palm and 5th digit into PIP crease  on 10/18/20 is about 9  wks s/p - cont be limited by scar tissue at volar [facility] to PIP and edema , tenderness improved -pain mostly with 5th PIP flexion. Making progress in scar tissue adhesions, increase extention and flexion of 5th with functional use. Cont to be limited in 5th [facility] extention -10 and PIP flexion  - cont to wear  hand base extention splint for 4th and 5th mostly during day   - did assess fit of splint last week- -doing well -  cont to show thick scar tissue from volar 5th PIP to [facility], with decrease AROM in PIP flexion and MC extention -  Grip strength did increase as well as functional use of dominant R hand in ADL's and IADl's - pt can benefit from OT services to return to independent use of R dominant hand.    OT Occupational Profile and History Problem  Focused Assessment - Including review of records relating to presenting problem    Occupational performance deficits (Please refer to evaluation for details): ADL's;IADL's;Play;Leisure    Body Structure / Function / Physical Skills ADL;Flexibility;ROM;UE functional use;Scar mobility;Edema;Pain;Strength;IADL    Rehab Potential Good    Clinical Decision Making Limited treatment options, no task modification necessary    Comorbidities Affecting Occupational Performance: None    Modification or Assistance to Complete Evaluation  No modification of tasks or assist necessary to complete eval    OT Frequency 2x / week    OT Duration 6 weeks    OT Treatment/Interventions Self-care/ADL training;Patient/family education;Splinting;Paraffin;Fluidtherapy;Contrast Bath;Manual Therapy;Passive range of motion;Scar mobilization;Moist Heat  Consulted and Agree with Plan of Care Patient             Patient will benefit from skilled therapeutic intervention in order to improve the following deficits and impairments:   Body Structure / Function / Physical Skills: ADL, Flexibility, ROM, UE functional use, Scar mobility, Edema, Pain, Strength, IADL       Visit Diagnosis: Muscle weakness (generalized)  Scar condition and fibrosis of skin  Stiffness of right hand, not elsewhere classified  Stiffness of left hand, not elsewhere classified    Problem List Patient Active Problem List   Diagnosis Date Noted   Carotid stenosis 09/26/2020   Primary neuroendocrine carcinoma of rectum ([place]) 08/22/2020   Dupuytren's contracture of left hand 11/02/2018   Sepsis ([place]) 08/27/2018   Non-ST elevation MI (NSTEMI) ([place]) 01/18/2018   AAA (abdominal aortic aneurysm) 09/24/2017   Leaking abdominal aortic aneurysm (AAA) 08/22/2017   CLL (chronic lymphocytic leukemia) ([place]) 05/24/2017   Goals of care, counseling/discussion 05/24/2017   B12 deficiency 03/21/2017   Elevated troponin I level 03/03/2015   Deep  venous thrombosis of profunda femoris vein (HCC) 01/23/2015   BPH (benign prostatic hyperplasia) 01/23/2015   Urge urinary incontinence 01/23/2015   Type 2 diabetes mellitus ([place]) 10/31/2014   Benign gastric polyp 10/31/2014   Absence of bladder continence 10/09/2014   Acute non-ST elevation myocardial infarction (NSTEMI) ([place]) 08/15/2014   Atrial fibrillation ([place]) 08/05/2014   Antiphospholipid syndrome ([place]) 08/01/2014   H/O deep venous thrombosis 08/01/2014   Non-ST elevation myocardial infarction (NSTEMI), subendocardial infarction, subsequent episode of care ([place]) 07/30/2014   Barrett esophagus 07/28/2014   Diabetes mellitus, type 2 ([place]) 07/28/2014   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/28/2014   Hyperplastic adenomatous polyp of stomach 07/28/2014   Benign essential HTN 17/91/5056   Chronic systolic heart failure ([place]) 04/19/2014   AAA (abdominal aortic aneurysm) without rupture 04/05/2014   Arteriosclerosis of coronary artery 04/05/2014   Breathlessness on exertion 04/05/2014   Abdominal aortic aneurysm (AAA) without rupture 04/05/2014   Acute non-ST segment elevation myocardial infarction ([place]) 05/14/2013   Acid reflux 05/04/2013   Combined fat and carbohydrate induced hyperlipemia 05/03/2013   Obstructive apnea 05/03/2013   Arthritis, degenerative 05/03/2013   Peripheral blood vessel disorder ([place]) 05/03/2013   Arthritis or polyarthritis, rheumatoid ([place]) 05/03/2013   Compulsive tobacco user syndrome 05/03/2013   Rheumatoid arthritis ([place]) 05/03/2013   Peripheral vascular disease ([place]) 05/03/2013   Current tobacco use 05/03/2013    Rosalyn Gess, OTR/L,CLT 12/19/2020, 9:39 AM  [place] [facility] [address], [place], [address] Phone: [phone]   Fax:  [phone]  Name: Martin Adkins MRN: [id number] Date of Birth: 10-12-1945

## 2020-12-21 ENCOUNTER — Ambulatory Visit: Payer: Medicare PPO | Admitting: Occupational Therapy

## 2020-12-21 DIAGNOSIS — M25641 Stiffness of right hand, not elsewhere classified: Secondary | ICD-10-CM

## 2020-12-21 DIAGNOSIS — M6281 Muscle weakness (generalized): Secondary | ICD-10-CM | POA: Diagnosis not present

## 2020-12-21 DIAGNOSIS — M25642 Stiffness of left hand, not elsewhere classified: Secondary | ICD-10-CM

## 2020-12-21 DIAGNOSIS — L905 Scar conditions and fibrosis of skin: Secondary | ICD-10-CM

## 2020-12-21 NOTE — Therapy (Signed)
[facility] [address], [place], [address] Phone: [phone]   Fax:  [phone]  Occupational Therapy Treatment  Patient Details  Name: Martin Adkins MRN: [id number] Date of Birth: [birth year] Referring Provider (OT): DR Poggi   Encounter Date: 12/21/2020   OT End of Session - 12/21/20 1316     Visit Number 8    Number of Visits 12    Date for OT Re-Evaluation 12/27/20    OT Start Time 1315    OT Stop Time 1350    OT Time Calculation (min) 35 min    Activity Tolerance Patient tolerated treatment well    Behavior During Therapy [facility] for tasks assessed/performed             Past Medical History:  Diagnosis Date   AAA (abdominal aortic aneurysm) without rupture    a.) [place] 02/09/04 - 3.1 x 3.4 cm. b.) [place] 02/14/05 - 3.62 x 3.63 cm. c.) [place] 02/25/06 - 3.97 x 4.0 cm. d.) CT 07/09/11 - 4.9 cm. e.) s/p EVAR 2013. f.) enlarging AAA --> back to the OR on 09/24/2017 for placement of a 27 mm x 12 cm RIGHT iliac extension limb down to distal CIA.   Aortic atherosclerosis (HCC)    APS (antiphospholipid syndrome) (HCC)    Arthritis    Atrial fibrillation (HCC)    a.) CHA2DS2-VASc Score = 6 (age, CHF, HTN, prior DVT x 2, prior MI). b.) on rivaroxaban   B12 deficiency 03/21/2017   Barrett's esophagus    BPH (benign prostatic hyperplasia)    CAD (coronary artery disease)    a.) LHC 11/14/95 --> 95% mLCx, 95% OM1; PTCA. b.) 4v CABG 2004 --> LIMA-LAD, SVG-D1,RCA,OM. c.) NSTEMI 05/02/2013 --> PCI with DES x 3 to SVG-OM in 2 distinct areas with filter. d.) Inf STEMI 07/30/14 - LHC --> 50% LM, 90% mLCx, 100% OM2, 100% RPDA; LIMA-LAD patent; 100% occ of SVG OM2,OM3,PDA; med mgmt. e.) LHC 01/19/18 - patent LIMA-LAD; other grafts occ; not amenable to PCI/further bypass.   Cardiomyopathy ([place])    Chronic anticoagulation    a.) Rivaroxaban   CLL (chronic lymphocytic leukemia) ([place]) 05/24/2017   DVT (deep venous  thrombosis) (HCC)    GERD (gastroesophageal reflux disease)    HFrEF (heart failure with reduced ejection fraction) ([place])    a.) TTE 02/22/2020 --> mod. LV systolic dysfunction; LVEF 35-40%.   History of hiatal hernia    History of shingles 2013   Hyperlipemia    Hyperplastic polyps of stomach    a.) Bx on 07/24/2020 --> well differentiated NET; WHO grade I.   Hypertension    NSTEMI (non-ST elevated myocardial infarction) ([place]) 05/02/2013   a.) LHC 05/03/2013 --> 50% LM, 100% LAD, 75% mLCx, 100% dLCx, 60% mRCA; patient LIMA-LAD, occluded SVG-D1 and SVG-PDA. SVG-OM with extensive thrombus and distal 99% lesion. HIGH RISK PCI performed at [facility] with placement of DES x 3 to 2 distinct areas of SVG with filter protection.   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 No intervention; medical management.   Valvular regurgitation    a.) TTE 02/22/2020 --> LVEF 35-40%; mild panvalvular.    Past Surgical History:  Procedure Laterality Date   ABDOMINAL AORTA STENT     CHOLECYSTECTOMY     COLONOSCOPY  11/24/2009   COLONOSCOPY N/A 10/09/2020   Procedure: COLONOSCOPY;  Surgeon: Mansouraty, Telford Nab., MD;  Location: [facility];  Service: Gastroenterology;  Laterality: N/A;   COLONOSCOPY WITH PROPOFOL N/A 07/24/2020   Procedure: COLONOSCOPY WITH PROPOFOL;  Surgeon: Toledo, Benay Pike, MD;  Location: [facility] ENDOSCOPY;   Service: Gastroenterology;  Laterality: N/A;   CORONARY ANGIOPLASTY WITH STENT PLACEMENT Left 05/03/2013   Procedure: CORONARY ANGIOPLASTY WITH STENT PLACEMENT (DES x 3 to SVG-OM graft); Location: [facility]   CORONARY ARTERY BYPASS GRAFT N/A 01/21/2002   Procedure: 4V CABG (LIMA-LAD, SVG-D1, SVG-RCA, SVG-OM); Location: [facility]   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   Procedure: DUPUYTREN CONTRACTURE RELEASE LEFT LONG FINGER;  Surgeon: Corky Mull, MD;  Location: [facility]S;  Service: Orthopedics;  Laterality: Left;   DUPUYTREN CONTRACTURE RELEASE Right 10/18/2020   Procedure: DUPUYTREN CONTRACTURE RELEASE;  Surgeon: Corky Mull, MD;  Location: [facility];  Service: Orthopedics;  Laterality: Right;   ENDOVASCULAR REPAIR/STENT GRAFT N/A 09/24/2017   Procedure: ENDOVASCULAR REPAIR/STENT GRAFT (27 mm x 12 cm RIGHT iliac limb extension to distal CIA);  Surgeon: Algernon Huxley, MD;  Location: [facility];  Service: Cardiovascular;  Laterality: N/A;   ESOPHAGOGASTRODUODENOSCOPY  05/26/02  03/23/12   ESOPHAGOGASTRODUODENOSCOPY (EGD) WITH PROPOFOL N/A 10/28/2016   Procedure: ESOPHAGOGASTRODUODENOSCOPY (EGD) WITH PROPOFOL;  Surgeon: Manya Silvas, MD;  Location[facility];  Service: Endoscopy;  Laterality: N/A;   EUS N/A 10/09/2020   Procedure: LOWER ENDOSCOPIC ULTRASOUND (EUS);  Surgeon: Irving Copas., MD;  Location: [facility];  Service: Gastroenterology;  Laterality: N/A;   INCISION AND DRAINAGE ABSCESS N/A 06/14/2020   Procedure: INCISION AND DRAINAGE ABSCESS;  Surgeon: Herbert Pun, MD;  Location: [facility];  Service: General;  Laterality: N/A;   INGUINAL HERNIA REPAIR Left    LEFT HEART CATH AND CORONARY ANGIOGRAPHY N/A 01/19/2018   Procedure: LEFT HEART CATH AND CORONARY ANGIOGRAPHY;  Surgeon: Teodoro Spray, MD;  Location: [facility];  Service: Cardiovascular;  Laterality: N/A;   LEFT HEART CATH AND CORONARY ANGIOGRAPHY Left 11/14/1995   Procedure:  CARDIAC CATHETERIZATION (PTCA); Location: [facility]; Surgeon: Doristine Bosworth, MD   LEFT HEART CATH AND CORS/GRAFTS ANGIOGRAPHY Left 07/30/2014   Procedure: CARDIAC CATHETERIZATION; Location: [facility]   PERIPHERAL VASCULAR CATHETERIZATION N/A 09/06/2014   Procedure: IVC Filter Removal;  Surgeon: Katha Cabal, MD;  Location: [facility];  Service: Cardiovascular;  Laterality: N/A;   POLYPECTOMY  10/09/2020   Procedure: POLYPECTOMY;  Surgeon: Rush Landmark Telford Nab., MD;  Location: [facility];  Service: Gastroenterology;;   SUBMUCOSAL LIFTING INJECTION  10/09/2020   Procedure: SUBMUCOSAL LIFTING INJECTION;  Surgeon: Irving Copas., MD;  Location: [facility];  Service: Gastroenterology;;   TRANSURETHRAL RESECTION OF PROSTATE N/A 02/20/2015   Procedure: TRANSURETHRAL RESECTION OF THE PROSTATE (TURP);  Surgeon: Hollice Espy, MD;  Location: [facility];  Service: Urology;  Laterality: N/A;    There were no vitals filed for this visit.   Subjective Assessment - 12/21/20  1316     Subjective  My hand is getting better - using it more to do different things - like gripping knife and fork    Pertinent History Kristian Vowels is a 75 y.o. male who  had suture removal on 10/30/20, - Had on 11/18/20 release of Dupuytren's contracture from the right little finger. Surgery was performed by Dr. Roland Rack. Overall the patient feels that he is doing well, he does still report moderate swelling in the right hand and refer to OT    Patient Stated Goals I want to get the scar better so I can make fist and open my hand to get my hand in my pocket, pick up and hold objects - and push and pull without pain    Currently in Pain? No/denies                [facility] OT Assessment - 12/21/20 0001       Strength   Right Hand Grip (lbs) 52    Right Hand Lateral Pinch 21 lbs    Right Hand 3 Point Pinch 16 lbs    Left Hand Grip (lbs) 70    Left Hand Lateral Pinch 18 lbs    Left Hand 3 Point Pinch 16 lbs       Right Hand AROM   R Little  MCP 0-90 90 Degrees   extention -10   R Little PIP 0-100 75 Degrees   PROM 95 in session,                     OT Treatments/Exercises (OP) - 12/21/20 0001       RUE Paraffin   Number Minutes Paraffin 8 Minutes    RUE Paraffin Location Hand    Comments prior to scar massage              Cont to wear hand base extention splint for 4th and 5th digits at 0 degrees - per pt more during day than night time- when watching tv Assess splint last week- still working well - to make sure strap goes over DIP of 5th   scar massage done by OT manually and using mini massager  over cica scar pad on palmar  and volar 5th digit from PIP to [facility] focus this date - with great success - had increase PROM flexion pain free at PIP to 95-100 And increase extention at [place] scar pad for night time  and then also some use during day - silicon sleeve for 5th digit    Cont  isotoner glove night time and some during day    Rolling foam roller for extention and scar massage 20 reps and relax into hyper extention And can do table slides for composite extention - tolerate well - but reinforce no weight bearing 20 reps    PROM to DIP and PIP of 5th  and gentle composite to touching palm this date- pain less than 2/10  Then place and hold in composite fist at 8 75-80  PIP flexion and MC 90         OT Education - 12/21/20 1328     Education Details progress and changes to HEP    Person(s) Educated Patient    Methods Explanation;Demonstration;Tactile cues;Verbal cues;Handout    Comprehension Verbal cues required;Returned demonstration;Verbalized understanding              OT Short Term Goals - 11/15/20 1842  OT SHORT TERM GOAL #1   Title Pt to be independent in scar management to decrease tenderness and increase AROM in R digits with decrease pain    Baseline scar tender at [facility], [facility] and proximal crease and pain increase to 3-5/10 at the  most - scar thick and scabs still on 3 places - decrease ext and flexion    Time 3    Period Weeks    Status New    Target Date 12/06/20               OT Long Term Goals - 11/15/20 1844       OT LONG TERM GOAL #1   Title R grip and prehension strength increase to more than 75% compare to L hand to return to pull ,push door , squeeze washcloth without increase symptoms    Baseline grip NT - decrease AROM -and 4 wks s/p    Time 6    Period Weeks    Status New    Target Date 12/27/20      OT LONG TERM GOAL #2   Title R digits AROM increase to [facility] to touch palm to turn doorknob, grip steering wheel and utencils    Baseline Flexion at MC's 80's and PIP at 5th 55 degrees, 4th 85 degrees- unable to grip cylinder objects    Time 6    Period Weeks    Status New    Target Date 12/27/20      OT LONG TERM GOAL #3   Title Pt able to maintain extention of 4th and 5th  MC and PIP without splint for month    Baseline MC's extention 5th -30 and 4th -15 degrees - scabs still thick    Time 6    Period Weeks    Status New    Target Date 12/27/20                   Plan - 12/21/20 1316     Clinical Impression Statement Pt present at OT  s/p R dupuytrens release on palm and 5th digit into PIP crease  on 10/18/20 is about 9 1/2  wks s/p - cont be limited by scar tissue at volar DPC to 5th. SHow increase PROM to 5th PIP and composite not 95 degrees but AROM still about 75 -80 degrees. Making progress in scar tissue adhesions, increase extention and flexion of 5th  and  functional use. Cont to be limited in 5th [facility] extention -10 and PIP and composite active flexion  - cont to wear  hand base extention splint for 4th and 5th mostly during day   - did assess fit of splint last week- -doing well.Cont to show thick scar tissue from volar 5th PIP to [facility], with decrease AROM in PIP flexion and MC extention -  Grip and prehension strength did increase as well as functional use of dominant R hand in  ADL's and IADl's - pt can benefit from OT services to return to independent use of R dominant hand.    OT Occupational Profile and History Problem Focused Assessment - Including review of records relating to presenting problem    Occupational performance deficits (Please refer to evaluation for details): ADL's;IADL's;Play;Leisure    Body Structure / Function / Physical Skills ADL;Flexibility;ROM;UE functional use;Scar mobility;Edema;Pain;Strength;IADL    Rehab Potential Good    Clinical Decision Making Limited treatment options, no task modification necessary    Comorbidities Affecting Occupational Performance: None  Modification or Assistance to Complete Evaluation  No modification of tasks or assist necessary to complete eval    OT Frequency 1x / week    OT Duration 6 weeks    OT Treatment/Interventions Self-care/ADL training;Patient/family education;Splinting;Paraffin;Fluidtherapy;Contrast Bath;Manual Therapy;Passive range of motion;Scar mobilization;Moist Heat    OT Home Exercise Plan see pt instruction    Consulted and Agree with Plan of Care Patient             Patient will benefit from skilled therapeutic intervention in order to improve the following deficits and impairments:   Body Structure / Function / Physical Skills: ADL, Flexibility, ROM, UE functional use, Scar mobility, Edema, Pain, Strength, IADL       Visit Diagnosis: Muscle weakness (generalized)  Scar condition and fibrosis of skin  Stiffness of right hand, not elsewhere classified  Stiffness of left hand, not elsewhere classified    Problem List Patient Active Problem List   Diagnosis Date Noted   Carotid stenosis 09/26/2020   Primary neuroendocrine carcinoma of rectum ([place]) 08/22/2020   Dupuytren's contracture of left hand 11/02/2018   Sepsis ([place]) 08/27/2018   Non-ST elevation MI (NSTEMI) ([place]) 01/18/2018   AAA (abdominal aortic aneurysm) 09/24/2017   Leaking abdominal aortic aneurysm (AAA)  08/22/2017   CLL (chronic lymphocytic leukemia) ([place]) 05/24/2017   Goals of care, counseling/discussion 05/24/2017   B12 deficiency 03/21/2017   Elevated troponin I level 03/03/2015   Deep venous thrombosis of profunda femoris vein (HCC) 01/23/2015   BPH (benign prostatic hyperplasia) 01/23/2015   Urge urinary incontinence 01/23/2015   Type 2 diabetes mellitus ([place]) 10/31/2014   Benign gastric polyp 10/31/2014   Absence of bladder continence 10/09/2014   Acute non-ST elevation myocardial infarction (NSTEMI) ([place]) 08/15/2014   Atrial fibrillation ([place]) 08/05/2014   Antiphospholipid syndrome ([place]) 08/01/2014   H/O deep venous thrombosis 08/01/2014   Non-ST elevation myocardial infarction (NSTEMI), subendocardial infarction, subsequent episode of care ([place]) 07/30/2014   Barrett esophagus 07/28/2014   Diabetes mellitus, type 2 ([place]) 07/28/2014   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/28/2014   Hyperplastic adenomatous polyp of stomach 07/28/2014   Benign essential HTN 16/10/9602   Chronic systolic heart failure ([place]) 04/19/2014   AAA (abdominal aortic aneurysm) without rupture 04/05/2014   Arteriosclerosis of coronary artery 04/05/2014   Breathlessness on exertion 04/05/2014   Abdominal aortic aneurysm (AAA) without rupture 04/05/2014   Acute non-ST segment elevation myocardial infarction ([place]) 05/14/2013   Acid reflux 05/04/2013   Combined fat and carbohydrate induced hyperlipemia 05/03/2013   Obstructive apnea 05/03/2013   Arthritis, degenerative 05/03/2013   Peripheral blood vessel disorder ([place]) 05/03/2013   Arthritis or polyarthritis, rheumatoid ([place]) 05/03/2013   Compulsive tobacco user syndrome 05/03/2013   Rheumatoid arthritis ([place]) 05/03/2013   Peripheral vascular disease ([place]) 05/03/2013   Current tobacco use 05/03/2013    Rosalyn Gess, OTR/L,CLT 12/21/2020, 1:58 PM  [facility] [address], [place], [address] Phone: [phone]   Fax:  [phone]  Name: NEVAN CREIGHTON MRN: [id number] Date of Birth: Jan 22, 1945

## 2020-12-27 ENCOUNTER — Ambulatory Visit: Payer: Medicare PPO | Admitting: Occupational Therapy

## 2020-12-27 DIAGNOSIS — M25641 Stiffness of right hand, not elsewhere classified: Secondary | ICD-10-CM

## 2020-12-27 DIAGNOSIS — M25642 Stiffness of left hand, not elsewhere classified: Secondary | ICD-10-CM

## 2020-12-27 DIAGNOSIS — M6281 Muscle weakness (generalized): Secondary | ICD-10-CM | POA: Diagnosis not present

## 2020-12-27 DIAGNOSIS — L905 Scar conditions and fibrosis of skin: Secondary | ICD-10-CM

## 2021-01-03 ENCOUNTER — Ambulatory Visit: Payer: Medicare PPO | Admitting: Occupational Therapy

## 2021-01-03 ENCOUNTER — Encounter: Payer: Self-pay | Admitting: Occupational Therapy

## 2021-01-03 DIAGNOSIS — M6281 Muscle weakness (generalized): Secondary | ICD-10-CM | POA: Diagnosis not present

## 2021-01-03 DIAGNOSIS — L905 Scar conditions and fibrosis of skin: Secondary | ICD-10-CM

## 2021-01-03 DIAGNOSIS — M25641 Stiffness of right hand, not elsewhere classified: Secondary | ICD-10-CM

## 2021-01-03 DIAGNOSIS — M25642 Stiffness of left hand, not elsewhere classified: Secondary | ICD-10-CM

## 2021-01-03 NOTE — Therapy (Signed)
[place] [facility] [address], [place], [address] Phone: [phone]   Fax:  [phone]  Occupational Therapy Treatment  Patient Details  Name: Martin Adkins MRN: [id number] Date of Birth: 12/16/1945 Referring Provider (OT): DR Poggi   Encounter Date: 12/27/2020   OT End of Session - 01/02/21 1835     Visit Number 9    Number of Visits 12    Date for OT Re-Evaluation 12/27/20    OT Start Time 0945    OT Stop Time 1035    OT Time Calculation (min) 50 min    Activity Tolerance Patient tolerated treatment well    Behavior During Therapy [facility] for tasks assessed/performed             Past Medical History:  Diagnosis Date   AAA (abdominal aortic aneurysm) without rupture    a.) [place] 02/09/04 - 3.1 x 3.4 cm. b.) [place] 02/14/05 - 3.62 x 3.63 cm. c.) [place] 02/25/06 - 3.97 x 4.0 cm. d.) CT 07/09/11 - 4.9 cm. e.) s/p EVAR 2013. f.) enlarging AAA --> back to the OR on 09/24/2017 for placement of a 27 mm x 12 cm RIGHT iliac extension limb down to distal CIA.   Aortic atherosclerosis (HCC)    APS (antiphospholipid syndrome) (HCC)    Arthritis    Atrial fibrillation (HCC)    a.) CHA2DS2-VASc Score = 6 (age, CHF, HTN, prior DVT x 2, prior MI). b.) on rivaroxaban   B12 deficiency 03/21/2017   Barrett's esophagus    BPH (benign prostatic hyperplasia)    CAD (coronary artery disease)    a.) LHC 11/14/95 --> 95% mLCx, 95% OM1; PTCA. b.) 4v CABG 2004 --> LIMA-LAD, SVG-D1,RCA,OM. c.) NSTEMI 05/02/2013 --> PCI with DES x 3 to SVG-OM in 2 distinct areas with filter. d.) Inf STEMI 07/30/14 - LHC --> 50% LM, 90% mLCx, 100% OM2, 100% RPDA; LIMA-LAD patent; 100% occ of SVG OM2,OM3,PDA; med mgmt. e.) LHC 01/19/18 - patent LIMA-LAD; other grafts occ; not amenable to PCI/further bypass.   Cardiomyopathy ([place])    Chronic anticoagulation    a.) Rivaroxaban   CLL (chronic lymphocytic leukemia) ([place]) 05/24/2017   DVT (deep venous  thrombosis) (HCC)    GERD (gastroesophageal reflux disease)    HFrEF (heart failure with reduced ejection fraction) ([place])    a.) TTE 02/22/2020 --> mod. LV systolic dysfunction; LVEF 35-40%.   History of hiatal hernia    History of shingles 2013   Hyperlipemia    Hyperplastic polyps of stomach    a.) Bx on 07/24/2020 --> well differentiated NET; WHO grade I.   Hypertension    NSTEMI (non-ST elevated myocardial infarction) ([place]) 05/02/2013   a.) LHC 05/03/2013 --> 50% LM, 100% LAD, 75% mLCx, 100% dLCx, 60% mRCA; patient LIMA-LAD, occluded SVG-D1 and SVG-PDA. SVG-OM with extensive thrombus and distal 99% lesion. HIGH RISK PCI performed at [facility] with placement of DES x 3 to 2 distinct areas of SVG with filter protection.   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 No intervention; medical management.   Valvular regurgitation    a.) TTE 02/22/2020 --> LVEF 35-40%; mild panvalvular.    Past Surgical History:  Procedure Laterality Date   ABDOMINAL AORTA STENT     CHOLECYSTECTOMY     COLONOSCOPY  11/24/2009   COLONOSCOPY N/A 10/09/2020   Procedure: COLONOSCOPY;  Surgeon: Mansouraty, Telford Nab., MD;  Location: [facility] ENDOSCOPY;  Service: Gastroenterology;  Laterality: N/A;   COLONOSCOPY WITH PROPOFOL N/A 07/24/2020   Procedure: COLONOSCOPY WITH PROPOFOL;  Surgeon: Toledo, Benay Pike, MD;  Location: [facility] ENDOSCOPY;   Service: Gastroenterology;  Laterality: N/A;   CORONARY ANGIOPLASTY WITH STENT PLACEMENT Left 05/03/2013   Procedure: CORONARY ANGIOPLASTY WITH STENT PLACEMENT (DES x 3 to SVG-OM graft); Location: [facility]   CORONARY ARTERY BYPASS GRAFT N/A 01/21/2002   Procedure: 4V CABG (LIMA-LAD, SVG-D1, SVG-RCA, SVG-OM); Location: [facility]   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   Procedure: DUPUYTREN CONTRACTURE RELEASE LEFT LONG FINGER;  Surgeon: Corky Mull, MD;  Location: [facility] ORS;  Service: Orthopedics;  Laterality: Left;   DUPUYTREN CONTRACTURE RELEASE Right 10/18/2020   Procedure: DUPUYTREN CONTRACTURE RELEASE;  Surgeon: Corky Mull, MD;  Location: [facility] ORS;  Service: Orthopedics;  Laterality: Right;   ENDOVASCULAR REPAIR/STENT GRAFT N/A 09/24/2017   Procedure: ENDOVASCULAR REPAIR/STENT GRAFT (27 mm x 12 cm RIGHT iliac limb extension to distal CIA);  Surgeon: Algernon Huxley, MD;  Location: [facility];  Service: Cardiovascular;  Laterality: N/A;   ESOPHAGOGASTRODUODENOSCOPY  05/26/02  03/23/12   ESOPHAGOGASTRODUODENOSCOPY (EGD) WITH PROPOFOL N/A 10/28/2016   Procedure: ESOPHAGOGASTRODUODENOSCOPY (EGD) WITH PROPOFOL;  Surgeon: Manya Silvas, MD;  Location: [facility] ENDOSCOPY;  Service: Endoscopy;  Laterality: N/A;   EUS N/A 10/09/2020   Procedure: LOWER ENDOSCOPIC ULTRASOUND (EUS);  Surgeon: Irving Copas., MD;  Location: [facility];  Service: Gastroenterology;  Laterality: N/A;   INCISION AND DRAINAGE ABSCESS N/A 06/14/2020   Procedure: INCISION AND DRAINAGE ABSCESS;  Surgeon: Herbert Pun, MD;  Location: [facility] ORS;  Service: General;  Laterality: N/A;   INGUINAL HERNIA REPAIR Left    LEFT HEART CATH AND CORONARY ANGIOGRAPHY N/A 01/19/2018   Procedure: LEFT HEART CATH AND CORONARY ANGIOGRAPHY;  Surgeon: Teodoro Spray, MD;  Location: [place] [facility];  Service: Cardiovascular;  Laterality: N/A;   LEFT HEART CATH AND CORONARY ANGIOGRAPHY Left 11/14/1995   Procedure:  CARDIAC CATHETERIZATION (PTCA); Location: [facility]; Surgeon: Doristine Bosworth, MD   LEFT HEART CATH AND CORS/GRAFTS ANGIOGRAPHY Left 07/30/2014   Procedure: CARDIAC CATHETERIZATION; Location: [facility]   PERIPHERAL VASCULAR CATHETERIZATION N/A 09/06/2014   Procedure: IVC Filter Removal;  Surgeon: Katha Cabal, MD;  Location: [facility];  Service: Cardiovascular;  Laterality: N/A;   POLYPECTOMY  10/09/2020   Procedure: POLYPECTOMY;  Surgeon: Rush Landmark Telford Nab., MD;  Location: [facility];  Service: Gastroenterology;;   SUBMUCOSAL LIFTING INJECTION  10/09/2020   Procedure: SUBMUCOSAL LIFTING INJECTION;  Surgeon: Irving Copas., MD;  Location: [facility];  Service: Gastroenterology;;   TRANSURETHRAL RESECTION OF PROSTATE N/A 02/20/2015   Procedure: TRANSURETHRAL RESECTION OF THE PROSTATE (TURP);  Surgeon: Hollice Espy, MD;  Location: [facility] ORS;  Service: Urology;  Laterality: N/A;    There were no vitals filed for this visit.   Subjective Assessment - 01/03/21  1831     Subjective  Pt reports he continues to improve, planning to try some cooking this weekend for Christmas, getting together with his kids.    Pertinent History Martin Adkins is a 75 y.o. male who  had suture removal on 10/30/20, - Had on 11/18/20 release of Dupuytren's contracture from the right little finger. Surgery was performed by Dr. Roland Rack. Overall the patient feels that he is doing well, he does still report moderate swelling in the right hand and refer to OT    Patient Stated Goals I want to get the scar better so I can make fist and open my hand to get my hand in my pocket, pick up and hold objects - and push and pull without pain    Currently in Pain? No/denies    Pain Score 0-No pain             Strength     Right Hand Grip (lbs) 52     Right Hand Lateral Pinch 21 lbs     Right Hand 3 Point Pinch 16 lbs     Left Hand Grip (lbs) 70     Left Hand Lateral Pinch 18 lbs     Left Hand 3 Point  Pinch 16 lbs          Right Hand AROM    R Little  MCP 0-90 90 Degrees   extention -10    R Little PIP 0-100 75 Degrees   PROM 95 in session,     Paraffin to right hand and wrist for 8 mins to increase tissue mobility, decrease pain and increase motion.  Performed prior to manual scar massage.    Manual Therapy:   Following paraffin, pt seen for scar massage performed manually by therapist with use of mini massager and placed over CICA care pad on palmar surface of the hand and on volar 5th digit.   Issued new CICA scar pad for home for nightly use to soften and hydrate scar to reduce the risk of adhesions.   Pt continues to wear hand based extension splint for 4th and 5th digits, uses foam roll for finger extension and scar massage as previously instructed.  Table slides for finger extension, no weight bearing for 20 reps.  Continue with isotoner glove for edema management.   PROM to right hand and digits for composite flexion and extension.  Place and hold into composite flexion.     Response to tx: Pt remains limited by scar tissue in right hand especially at 5th digit for flexion and extension.  Continues to lack -10 degrees active extension, motion for fisting is limited for full composite fist however after therapeutic intervention he demonstrated improvements in ROM in 4th and 5th PIP and DIP.  Continuing to work on scar management with use of CICA care at night and massage during the day along with ROM exercises.  Continue to work towards goals in plan of care to decrease pain, increase ROM and decrease scar tissue to promote increased functional use of right UE.                  OT Education - 01/02/21 1834     Education Details progress and changes to HEP    Person(s) Educated Patient    Methods Explanation;Demonstration;Tactile cues;Verbal cues;Handout    Comprehension Verbal cues required;Returned demonstration;Verbalized understanding              OT Short  Term Goals - 11/15/20 1842  OT SHORT TERM GOAL #1   Title Pt to be independent in scar management to decrease tenderness and increase AROM in R digits with decrease pain    Baseline scar tender at [facility] and proximal crease and pain increase to 3-5/10 at the most - scar thick and scabs still on 3 places - decrease ext and flexion    Time 3    Period Weeks    Status New    Target Date 12/06/20               OT Long Term Goals - 11/15/20 1844       OT LONG TERM GOAL #1   Title R grip and prehension strength increase to more than 75% compare to L hand to return to pull ,push door , squeeze washcloth without increase symptoms    Baseline grip NT - decrease AROM -and 4 wks s/p    Time 6    Period Weeks    Status New    Target Date 12/27/20      OT LONG TERM GOAL #2   Title R digits AROM increase to [facility] to touch palm to turn doorknob, grip steering wheel and utencils    Baseline Flexion at MC's 80's and PIP at 5th 55 degrees, 4th 85 degrees- unable to grip cylinder objects    Time 6    Period Weeks    Status New    Target Date 12/27/20      OT LONG TERM GOAL #3   Title Pt able to maintain extention of 4th and 5th  MC and PIP without splint for month    Baseline MC's extention 5th -30 and 4th -15 degrees - scabs still thick    Time 6    Period Weeks    Status New    Target Date 12/27/20                   Plan - 01/02/21 1835     Clinical Impression Statement Pt present at OT  s/p R dupuytrens release on palm and 5th digit into PIP crease on 10/18/20 is about 10  wks s/p - cont be limited by scar tissue at volar [facility] to 5th. Pt remains limited by scar tissue in right hand especially at 5th digit for flexion and extension.  Continues to lack -10 degrees active extension, motion for fisting is limited for full composite fist however after therapeutic intervention he demonstrated improvements in ROM in 4th and 5th PIP and DIP.  Continuing to work on scar management  with use of CICA care at night and massage during the day along with ROM exercises.  Continue to work towards goals in plan of care to decrease pain, increase ROM and decrease scar tissue to promote increased functional use of right UE.    OT Occupational Profile and History Problem Focused Assessment - Including review of records relating to presenting problem    Occupational performance deficits (Please refer to evaluation for details): ADL's;IADL's;Play;Leisure    Body Structure / Function / Physical Skills ADL;Flexibility;ROM;UE functional use;Scar mobility;Edema;Pain;Strength;IADL    Rehab Potential Good    Clinical Decision Making Limited treatment options, no task modification necessary    Comorbidities Affecting Occupational Performance: None    Modification or Assistance to Complete Evaluation  No modification of tasks or assist necessary to complete eval    OT Frequency 1x / week    OT Duration 6 weeks    OT Treatment/Interventions Self-care/ADL training;Patient/family  education;Splinting;Paraffin;Fluidtherapy;Contrast Bath;Manual Therapy;Passive range of motion;Scar mobilization;Moist Heat    OT Home Exercise Plan see pt instruction    Consulted and Agree with Plan of Care Patient             Patient will benefit from skilled therapeutic intervention in order to improve the following deficits and impairments:   Body Structure / Function / Physical Skills: ADL, Flexibility, ROM, UE functional use, Scar mobility, Edema, Pain, Strength, IADL       Visit Diagnosis: Muscle weakness (generalized)  Scar condition and fibrosis of skin  Stiffness of right hand, not elsewhere classified  Stiffness of left hand, not elsewhere classified    Problem List Patient Active Problem List   Diagnosis Date Noted   Carotid stenosis 09/26/2020   Primary neuroendocrine carcinoma of rectum ([place]) 08/22/2020   Dupuytren's contracture of left hand 11/02/2018   Sepsis ([place]) 08/27/2018    Non-ST elevation MI (NSTEMI) ([place]) 01/18/2018   AAA (abdominal aortic aneurysm) 09/24/2017   Leaking abdominal aortic aneurysm (AAA) 08/22/2017   CLL (chronic lymphocytic leukemia) ([place]) 05/24/2017   Goals of care, counseling/discussion 05/24/2017   B12 deficiency 03/21/2017   Elevated troponin I level 03/03/2015   Deep venous thrombosis of profunda femoris vein (HCC) 01/23/2015   BPH (benign prostatic hyperplasia) 01/23/2015   Urge urinary incontinence 01/23/2015   Type 2 diabetes mellitus ([place]) 10/31/2014   Benign gastric polyp 10/31/2014   Absence of bladder continence 10/09/2014   Acute non-ST elevation myocardial infarction (NSTEMI) ([place]) 08/15/2014   Atrial fibrillation ([place]) 08/05/2014   Antiphospholipid syndrome ([place]) 08/01/2014   H/O deep venous thrombosis 08/01/2014   Non-ST elevation myocardial infarction (NSTEMI), subendocardial infarction, subsequent episode of care ([place]) 07/30/2014   Barrett esophagus 07/28/2014   Diabetes mellitus, type 2 ([place]) 07/28/2014   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/28/2014   Hyperplastic adenomatous polyp of stomach 07/28/2014   Benign essential HTN 37/48/2707   Chronic systolic heart failure ([place]) 04/19/2014   AAA (abdominal aortic aneurysm) without rupture 04/05/2014   Arteriosclerosis of coronary artery 04/05/2014   Breathlessness on exertion 04/05/2014   Abdominal aortic aneurysm (AAA) without rupture 04/05/2014   Acute non-ST segment elevation myocardial infarction ([place]) 05/14/2013   Acid reflux 05/04/2013   Combined fat and carbohydrate induced hyperlipemia 05/03/2013   Obstructive apnea 05/03/2013   Arthritis, degenerative 05/03/2013   Peripheral blood vessel disorder ([place]) 05/03/2013   Arthritis or polyarthritis, rheumatoid ([place]) 05/03/2013   Compulsive tobacco user syndrome 05/03/2013   Rheumatoid arthritis ([place]) 05/03/2013   Peripheral vascular disease ([place]) 05/03/2013   Current tobacco use 05/03/2013   Daxtin Leiker T Krist Rosenboom, OTR/L,  CLT  Delicia Berens, OT 01/03/2021, 6:54 PM  [place] [facility] [address], [place], [address] Phone: [phone]   Fax:  [phone]  Name: KHYLAN SAWYER MRN: [id number] Date of Birth: Mar 16, 1945

## 2021-01-05 NOTE — Therapy (Signed)
[place] [facility] [address], [place], [address] Phone: [phone]   Fax:  [phone]  Occupational Therapy Treatment/Recertification Progress Update Reporting period from 11/15/2020 to 01/03/2021  Patient Details  Name: Martin Adkins MRN: [id number] Date of Birth: 20-May-1945 Referring Provider (OT): DR Poggi   Encounter Date: 01/03/2021   OT End of Session - 01/05/21 1615     Visit Number 10    Number of Visits 18    Date for OT Re-Evaluation 02/07/21    OT Start Time 0945    OT Stop Time 1031    OT Time Calculation (min) 46 min    Activity Tolerance Patient tolerated treatment well    Behavior During Therapy [facility] for tasks assessed/performed             Past Medical History:  Diagnosis Date   AAA (abdominal aortic aneurysm) without rupture    a.) [place] 02/09/04 - 3.1 x 3.4 cm. b.) [place] 02/14/05 - 3.62 x 3.63 cm. c.) [place] 02/25/06 - 3.97 x 4.0 cm. d.) CT 07/09/11 - 4.9 cm. e.) s/p EVAR 2013. f.) enlarging AAA --> back to the OR on 09/24/2017 for placement of a 27 mm x 12 cm RIGHT iliac extension limb down to distal CIA.   Aortic atherosclerosis (HCC)    APS (antiphospholipid syndrome) (HCC)    Arthritis    Atrial fibrillation (HCC)    a.) CHA2DS2-VASc Score = 6 (age, CHF, HTN, prior DVT x 2, prior MI). b.) on rivaroxaban   B12 deficiency 03/21/2017   Barrett's esophagus    BPH (benign prostatic hyperplasia)    CAD (coronary artery disease)    a.) LHC 11/14/95 --> 95% mLCx, 95% OM1; PTCA. b.) 4v CABG 2004 --> LIMA-LAD, SVG-D1,RCA,OM. c.) NSTEMI 05/02/2013 --> PCI with DES x 3 to SVG-OM in 2 distinct areas with filter. d.) Inf STEMI 07/30/14 - LHC --> 50% LM, 90% mLCx, 100% OM2, 100% RPDA; LIMA-LAD patent; 100% occ of SVG OM2,OM3,PDA; med mgmt. e.) LHC 01/19/18 - patent LIMA-LAD; other grafts occ; not amenable to PCI/further bypass.   Cardiomyopathy ([place])    Chronic anticoagulation    a.) Rivaroxaban    CLL (chronic lymphocytic leukemia) ([place]) 05/24/2017   DVT (deep venous thrombosis) (HCC)    GERD (gastroesophageal reflux disease)    HFrEF (heart failure with reduced ejection fraction) ([place])    a.) TTE 02/22/2020 --> mod. LV systolic dysfunction; LVEF 35-40%.   History of hiatal hernia    History of shingles 2013   Hyperlipemia    Hyperplastic polyps of stomach    a.) Bx on 07/24/2020 --> well differentiated NET; WHO grade I.   Hypertension    NSTEMI (non-ST elevated myocardial infarction) ([place]) 05/02/2013   a.) LHC 05/03/2013 --> 50% LM, 100% LAD, 75% mLCx, 100% dLCx, 60% mRCA; patient LIMA-LAD, occluded SVG-D1 and SVG-PDA. SVG-OM with extensive thrombus and distal 99% lesion. HIGH RISK PCI performed at [facility] with placement of DES x 3 to 2 distinct areas of SVG with filter protection.   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  b.) Stage I (cT1, cN0, cM0)   PVD (peripheral vascular disease) ([place])    RA (rheumatoid arthritis) ([place])    S/P CABG x  4 01/21/2002   a.) 4v; LIMA-LAD, SVG-D1, SVG-RCA, SVG-OM   SOB (shortness of breath)    ST elevation myocardial infarction (STEMI) of inferior wall ([place]) 07/30/2014   a.) LHC --> 50% LM, 90% mLCx, 100% OM2, 100% RPDA; LIMA-LAD patent; 100% occlusions of SVG OM2, OM3, PDA. No intervention; medical management.   Valvular regurgitation    a.) TTE 02/22/2020 --> LVEF 35-40%; mild panvalvular.    Past Surgical History:  Procedure Laterality Date   ABDOMINAL AORTA STENT     CHOLECYSTECTOMY     COLONOSCOPY  11/24/2009   COLONOSCOPY N/A 10/09/2020   Procedure: COLONOSCOPY;  Surgeon: Mansouraty, Telford Nab., MD;  Location: [facility];  Service: Gastroenterology;  Laterality: N/A;   COLONOSCOPY WITH PROPOFOL N/A 07/24/2020   Procedure: COLONOSCOPY  WITH PROPOFOL;  Surgeon: Toledo, Benay Pike, MD;  Location: [facility] ENDOSCOPY;  Service: Gastroenterology;  Laterality: N/A;   CORONARY ANGIOPLASTY WITH STENT PLACEMENT Left 05/03/2013   Procedure: CORONARY ANGIOPLASTY WITH STENT PLACEMENT (DES x 3 to SVG-OM graft); Location: [facility]   CORONARY ARTERY BYPASS GRAFT N/A 01/21/2002   Procedure: 4V CABG (LIMA-LAD, SVG-D1, SVG-RCA, SVG-OM); Location: [facility]   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   Procedure: DUPUYTREN CONTRACTURE RELEASE LEFT LONG FINGER;  Surgeon: Corky Mull, MD;  Location: [facility];  Service: Orthopedics;  Laterality: Left;   DUPUYTREN CONTRACTURE RELEASE Right 10/18/2020   Procedure: DUPUYTREN CONTRACTURE RELEASE;  Surgeon: Corky Mull, MD;  Location: [facility];  Service: Orthopedics;  Laterality: Right;   ENDOVASCULAR REPAIR/STENT GRAFT N/A 09/24/2017   Procedure: ENDOVASCULAR REPAIR/STENT GRAFT (27 mm x 12 cm RIGHT iliac limb extension to distal CIA);  Surgeon: Algernon Huxley, MD;  Location: [facility];  Service: Cardiovascular;  Laterality: N/A;   ESOPHAGOGASTRODUODENOSCOPY  05/26/02  03/23/12   ESOPHAGOGASTRODUODENOSCOPY (EGD) WITH PROPOFOL N/A 10/28/2016   Procedure: ESOPHAGOGASTRODUODENOSCOPY (EGD) WITH PROPOFOL;  Surgeon: Manya Silvas, MD;  Location: [facility] ENDOSCOPY;  Service: Endoscopy;  Laterality: N/A;   EUS N/A 10/09/2020   Procedure: LOWER ENDOSCOPIC ULTRASOUND (EUS);  Surgeon: Irving Copas., MD;  Location: [facility];  Service: Gastroenterology;  Laterality: N/A;   INCISION AND DRAINAGE ABSCESS N/A 06/14/2020   Procedure: INCISION AND DRAINAGE ABSCESS;  Surgeon: Herbert Pun, MD;  Location: [facility];  Service: General;  Laterality: N/A;   INGUINAL HERNIA REPAIR Left    LEFT HEART CATH AND CORONARY ANGIOGRAPHY N/A 01/19/2018   Procedure: LEFT HEART CATH AND CORONARY ANGIOGRAPHY;  Surgeon: Teodoro Spray, MD;  Location: [facility];  Service: Cardiovascular;  Laterality: N/A;    LEFT HEART CATH AND CORONARY ANGIOGRAPHY Left 11/14/1995   Procedure: CARDIAC CATHETERIZATION (PTCA); Location: [facility]; Surgeon: Doristine Bosworth, MD   LEFT HEART CATH AND CORS/GRAFTS ANGIOGRAPHY Left 07/30/2014   Procedure: CARDIAC CATHETERIZATION; Location: [facility]   PERIPHERAL VASCULAR CATHETERIZATION N/A 09/06/2014   Procedure: IVC Filter Removal;  Surgeon: Katha Cabal, MD;  Location: [facility];  Service: Cardiovascular;  Laterality: N/A;   POLYPECTOMY  10/09/2020   Procedure: POLYPECTOMY;  Surgeon: Rush Landmark Telford Nab., MD;  Location: [facility];  Service: Gastroenterology;;   SUBMUCOSAL LIFTING INJECTION  10/09/2020   Procedure: SUBMUCOSAL LIFTING INJECTION;  Surgeon: Irving Copas., MD;  Location: [facility];  Service: Gastroenterology;;   TRANSURETHRAL RESECTION OF PROSTATE N/A 02/20/2015   Procedure: TRANSURETHRAL RESECTION OF THE PROSTATE (TURP);  Surgeon: Hollice Espy, MD;  Location: [facility];  Service: Urology;  Laterality: N/A;    There were no vitals filed for  this visit.   Subjective Assessment - 01/04/21 1605     Subjective  Pt reports he is using the hand more but still cant get the finger all the way down.  Pain rated as 0, some mild pain when he wears the splint. I can feel it pulling.  Wears it more during the day than at night, reports it is difficult to sleep in.  Exercises going well. Made Christmas dinner with a ham, spent time with family but reports his hand did well with cooking tasks.    Pertinent History Mynor Garrelts is a 75 y.o. male who  had suture removal on 10/30/20, - Had on 11/18/20 release of Dupuytren's contracture from the right little finger. Surgery was performed by Dr. Roland Rack. Overall the patient feels that he is doing well, he does still report moderate swelling in the right hand and refer to OT    Patient Stated Goals I want to get the scar better so I can make fist and open my hand to get my hand in my pocket, pick up  and hold objects - and push and pull without pain    Currently in Pain? No/denies    Pain Score 0-No pain                [facility] OT Assessment - 01/04/21 1619       Strength   Right Hand Grip (lbs) 52    Right Hand Lateral Pinch 20 lbs    Right Hand 3 Point Pinch 16 lbs    Left Hand Grip (lbs) 75    Left Hand Lateral Pinch 22 lbs    Left Hand 3 Point Pinch 16 lbs      Right Hand AROM   R Index  MCP 0-90 90 Degrees    R Index PIP 0-100 95 Degrees    R Long  MCP 0-90 90 Degrees    R Long PIP 0-100 90 Degrees    R Ring  MCP 0-90 85 Degrees    R Ring PIP 0-100 95 Degrees    R Little  MCP 0-90 90 Degrees    R Little PIP 0-100 75 Degrees    R Little DIP 0-70 50 Degrees             Paraffin to right hand and wrist for 8 mins to increase tissue mobility, decrease pain and increase motion.  Performed prior to manual scar massage.     Manual Therapy:   Following paraffin, pt seen for scar massage performed manually by therapist with use of mini massager and placed over CICA care pad on palmar surface of the hand and on volar 5th digit.   Issued new CICA scar pad for home for nightly use to soften and hydrate scar to reduce the risk of adhesions.  Manual stretching to carpals for spreading to decrease edema and increase motion in hand.     Pt continues to wear hand based extension splint for 4th and 5th digits, uses foam roll for finger extension and scar massage as previously instructed.  Table slides for finger extension, no weight bearing for 20 reps.  Continue with isotoner glove for edema management.    PROM to right hand and digits for composite flexion and extension.  Place and hold into composite flexion.   Measurements taken see flowsheet for details.     Response to tx: Pt continues to progress towards goals, he has been focusing on scar management and motion over the last  few weeks and reports improvements in his functional ability to use his hand.  He reports  engaging in cooking tasks which involved lifting a ham and did not have pain or issues.  He is able to demonstrate improvements in composite fisting, he does lack -10 degrees of extension in 4 and 5th digits at PIP.  He continues to benefit from skilled OT services to address scar, edema, ROM, strength and functional use of hand for daily tasks.                OT Education - 01/05/21 1615     Education Details progress and changes to HEP    Person(s) Educated Patient    Methods Explanation;Demonstration;Tactile cues;Verbal cues;Handout    Comprehension Verbal cues required;Returned demonstration;Verbalized understanding              OT Short Term Goals - 01/05/21 1623       OT SHORT TERM GOAL #1   Title Pt to be independent in scar management to decrease tenderness and increase AROM in R digits with decrease pain    Baseline scar tender at [facility] and proximal crease and pain increase to 3-5/10 at the most - scar thick and scabs still on 3 places - decrease ext and flexion    Time 3    Period Weeks    Status Achieved    Target Date 12/06/20               OT Long Term Goals - 01/05/21 1623       OT LONG TERM GOAL #1   Title R grip and prehension strength increase to more than 75% compare to L hand to return to pull ,push door , squeeze washcloth without increase symptoms    Baseline grip NT - decrease AROM -and 4 wks s/p    Time 6    Period Weeks    Status On-going    Target Date 02/07/21      OT LONG TERM GOAL #2   Title R digits AROM increase to [facility] to touch palm to turn doorknob, grip steering wheel and utencils    Baseline Flexion at MC's 80's and PIP at 5th 55 degrees, 4th 85 degrees- unable to grip cylinder objects    Time 6    Period Weeks    Status On-going    Target Date 02/07/21      OT LONG TERM GOAL #3   Title Pt able to maintain extention of 4th and 5th  MC and PIP without splint for month    Baseline MC's extention 5th -30 and 4th -15  degrees - scabs still thick    Time 6    Period Weeks    Status On-going    Target Date 02/07/21                   Plan - 01/04/21 1618     Clinical Impression Statement Pt present at OT  s/p R dupuytrens release on palm and 5th digit into PIP crease on 10/18/20 is about 11 wks s/p - cont be limited by scar tissue at volar [facility] to 5th. Pt continues to progress towards goals, he has been focusing on scar management and motion over the last few weeks and reports improvements in his functional ability to use his hand.  He reports engaging in cooking tasks which involved lifting a ham and did not have pain or issues.  He is able to demonstrate improvements in  composite fisting, he does lack -10 degrees of extension in 4 and 5th digits at PIP.  He continues to benefit from skilled OT services to address scar, edema, ROM, strength and functional use of hand for daily tasks.    OT Occupational Profile and History Problem Focused Assessment - Including review of records relating to presenting problem    Occupational performance deficits (Please refer to evaluation for details): ADL's;IADL's;Play;Leisure    Body Structure / Function / Physical Skills ADL;Flexibility;ROM;UE functional use;Scar mobility;Edema;Pain;Strength;IADL    Rehab Potential Good    Clinical Decision Making Limited treatment options, no task modification necessary    Comorbidities Affecting Occupational Performance: None    Modification or Assistance to Complete Evaluation  No modification of tasks or assist necessary to complete eval    OT Frequency 1x / week    OT Duration 6 weeks    OT Treatment/Interventions Self-care/ADL training;Patient/family education;Splinting;Paraffin;Fluidtherapy;Contrast Bath;Manual Therapy;Passive range of motion;Scar mobilization;Moist Heat    OT Home Exercise Plan see pt instruction    Consulted and Agree with Plan of Care Patient             Patient will benefit from skilled  therapeutic intervention in order to improve the following deficits and impairments:   Body Structure / Function / Physical Skills: ADL, Flexibility, ROM, UE functional use, Scar mobility, Edema, Pain, Strength, IADL       Visit Diagnosis: Muscle weakness (generalized)  Scar condition and fibrosis of skin  Stiffness of right hand, not elsewhere classified  Stiffness of left hand, not elsewhere classified    Problem List Patient Active Problem List   Diagnosis Date Noted   Carotid stenosis 09/26/2020   Primary neuroendocrine carcinoma of rectum ([place]) 08/22/2020   Dupuytren's contracture of left hand 11/02/2018   Sepsis ([place]) 08/27/2018   Non-ST elevation MI (NSTEMI) ([place]) 01/18/2018   AAA (abdominal aortic aneurysm) 09/24/2017   Leaking abdominal aortic aneurysm (AAA) 08/22/2017   CLL (chronic lymphocytic leukemia) ([place]) 05/24/2017   Goals of care, counseling/discussion 05/24/2017   B12 deficiency 03/21/2017   Elevated troponin I level 03/03/2015   Deep venous thrombosis of profunda femoris vein (HCC) 01/23/2015   BPH (benign prostatic hyperplasia) 01/23/2015   Urge urinary incontinence 01/23/2015   Type 2 diabetes mellitus ([place]) 10/31/2014   Benign gastric polyp 10/31/2014   Absence of bladder continence 10/09/2014   Acute non-ST elevation myocardial infarction (NSTEMI) ([place]) 08/15/2014   Atrial fibrillation ([place]) 08/05/2014   Antiphospholipid syndrome ([place]) 08/01/2014   H/O deep venous thrombosis 08/01/2014   Non-ST elevation myocardial infarction (NSTEMI), subendocardial infarction, subsequent episode of care ([place]) 07/30/2014   Barrett esophagus 07/28/2014   Diabetes mellitus, type 2 ([place]) 07/28/2014   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/28/2014   Hyperplastic adenomatous polyp of stomach 07/28/2014   Benign essential HTN 62/94/7654   Chronic systolic heart failure ([place]) 04/19/2014   AAA (abdominal aortic aneurysm) without rupture 04/05/2014   Arteriosclerosis of  coronary artery 04/05/2014   Breathlessness on exertion 04/05/2014   Abdominal aortic aneurysm (AAA) without rupture 04/05/2014   Acute non-ST segment elevation myocardial infarction ([place]) 05/14/2013   Acid reflux 05/04/2013   Combined fat and carbohydrate induced hyperlipemia 05/03/2013   Obstructive apnea 05/03/2013   Arthritis, degenerative 05/03/2013   Peripheral blood vessel disorder ([place]) 05/03/2013   Arthritis or polyarthritis, rheumatoid ([place]) 05/03/2013   Compulsive tobacco user syndrome 05/03/2013   Rheumatoid arthritis ([place]) 05/03/2013   Peripheral vascular disease ([place]) 05/03/2013   Current tobacco use 05/03/2013   Amandeep Hogston  Oneita Jolly, OTR/L, CLT  Mata Rowen, OT 01/05/2021, 4:38 PM  [place] [facility] [address] [place], [address] Phone: [phone]   Fax:  [phone]  Name: Martin Adkins MRN: [id number] Date of Birth: 08-Jul-1945

## 2021-01-17 ENCOUNTER — Ambulatory Visit: Payer: Medicare PPO | Attending: Student | Admitting: Occupational Therapy

## 2021-01-17 DIAGNOSIS — M6281 Muscle weakness (generalized): Secondary | ICD-10-CM | POA: Diagnosis not present

## 2021-01-17 DIAGNOSIS — M25642 Stiffness of left hand, not elsewhere classified: Secondary | ICD-10-CM | POA: Diagnosis present

## 2021-01-17 DIAGNOSIS — M25641 Stiffness of right hand, not elsewhere classified: Secondary | ICD-10-CM | POA: Diagnosis present

## 2021-01-17 DIAGNOSIS — L905 Scar conditions and fibrosis of skin: Secondary | ICD-10-CM | POA: Insufficient documentation

## 2021-01-17 NOTE — Therapy (Signed)
[facility] [address], [place], [address] Phone: [phone]   Fax:  [phone]  Occupational Therapy Treatment  Patient Details  Name: [facility] MRN: [id number] Date of Birth: Jul 20, 1945 Referring Provider (OT): DR Poggi   Encounter Date: 01/17/2021   OT End of Session - 01/17/21 1533     Visit Number 11    Number of Visits 18    Date for OT Re-Evaluation 02/07/21    OT Start Time 1512    OT Stop Time 1545    OT Time Calculation (min) 33 min    Activity Tolerance Patient tolerated treatment well    Behavior During Therapy [facility] for tasks assessed/performed             Past Medical History:  Diagnosis Date   AAA (abdominal aortic aneurysm) without rupture    a.) [place] 02/09/04 - 3.1 x 3.4 cm. b.) [place] 02/14/05 - 3.62 x 3.63 cm. c.) [place] 02/25/06 - 3.97 x 4.0 cm. d.) CT 07/09/11 - 4.9 cm. e.) s/p EVAR 2013. f.) enlarging AAA --> back to the OR on 09/24/2017 for placement of a 27 mm x 12 cm RIGHT iliac extension limb down to distal CIA.   Aortic atherosclerosis (HCC)    APS (antiphospholipid syndrome) (HCC)    Arthritis    Atrial fibrillation (HCC)    a.) CHA2DS2-VASc Score = 6 (age, CHF, HTN, prior DVT x 2, prior MI). b.) on rivaroxaban   B12 deficiency 03/21/2017   Barrett's esophagus    BPH (benign prostatic hyperplasia)    CAD (coronary artery disease)    a.) LHC 11/14/95 --> 95% mLCx, 95% OM1; PTCA. b.) 4v CABG 2004 --> LIMA-LAD, SVG-D1,RCA,OM. c.) NSTEMI 05/02/2013 --> PCI with DES x 3 to SVG-OM in 2 distinct areas with filter. d.) Inf STEMI 07/30/14 - LHC --> 50% LM, 90% mLCx, 100% OM2, 100% RPDA; LIMA-LAD patent; 100% occ of SVG OM2,OM3,PDA; med mgmt. e.) LHC 01/19/18 - patent LIMA-LAD; other grafts occ; not amenable to PCI/further bypass.   Cardiomyopathy ([place])    Chronic anticoagulation    a.) Rivaroxaban   CLL (chronic lymphocytic leukemia) ([place]) 05/24/2017   DVT (deep venous  thrombosis) (HCC)    GERD (gastroesophageal reflux disease)    HFrEF (heart failure with reduced ejection fraction) ([place])    a.) TTE 02/22/2020 --> mod. LV systolic dysfunction; LVEF 35-40%.   History of hiatal hernia    History of shingles 2013   Hyperlipemia    Hyperplastic polyps of stomach    a.) Bx on 07/24/2020 --> well differentiated NET; WHO grade I.   Hypertension    NSTEMI (non-ST elevated myocardial infarction) ([place]) 05/02/2013   a.) LHC 05/03/2013 --> 50% LM, 100% LAD, 75% mLCx, 100% dLCx, 60% mRCA; patient LIMA-LAD, occluded SVG-D1 and SVG-PDA. SVG-OM with extensive thrombus and distal 99% lesion. HIGH RISK PCI performed at [facility] with placement of DES x 3 to 2 distinct areas of SVG with filter protection.   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 No intervention; medical management.   Valvular regurgitation    a.) TTE 02/22/2020 --> LVEF 35-40%; mild panvalvular.    Past Surgical History:  Procedure Laterality Date   ABDOMINAL AORTA STENT     CHOLECYSTECTOMY     COLONOSCOPY  11/24/2009   COLONOSCOPY N/A 10/09/2020   Procedure: COLONOSCOPY;  Surgeon: Mansouraty, Telford Nab., MD;  Location: [facility];  Service: Gastroenterology;  Laterality: N/A;   COLONOSCOPY WITH PROPOFOL N/A 07/24/2020   Procedure: COLONOSCOPY WITH PROPOFOL;  Surgeon: Toledo, Benay Pike, MD;  Location: [facility] ENDOSCOPY;   Service: Gastroenterology;  Laterality: N/A;   CORONARY ANGIOPLASTY WITH STENT PLACEMENT Left 05/03/2013   Procedure: CORONARY ANGIOPLASTY WITH STENT PLACEMENT (DES x 3 to SVG-OM graft); Location: [facility]   CORONARY ARTERY BYPASS GRAFT N/A 01/21/2002   Procedure: 4V CABG (LIMA-LAD, SVG-D1, SVG-RCA, SVG-OM); Location: [facility]   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   Procedure: DUPUYTREN CONTRACTURE RELEASE LEFT LONG FINGER;  Surgeon: Corky Mull, MD;  Location: [facility];  Service: Orthopedics;  Laterality: Left;   DUPUYTREN CONTRACTURE RELEASE Right 10/18/2020   Procedure: DUPUYTREN CONTRACTURE RELEASE;  Surgeon: Corky Mull, MD;  Location: [facility];  Service: Orthopedics;  Laterality: Right;   ENDOVASCULAR REPAIR/STENT GRAFT N/A 09/24/2017   Procedure: ENDOVASCULAR REPAIR/STENT GRAFT (27 mm x 12 cm RIGHT iliac limb extension to distal CIA);  Surgeon: Algernon Huxley, MD;  Location: [facility];  Service: Cardiovascular;  Laterality: N/A;   ESOPHAGOGASTRODUODENOSCOPY  05/26/02  03/23/12   ESOPHAGOGASTRODUODENOSCOPY (EGD) WITH PROPOFOL N/A 10/28/2016   Procedure: ESOPHAGOGASTRODUODENOSCOPY (EGD) WITH PROPOFOL;  Surgeon: Manya Silvas, MD;  Location: [facility];  Service: Endoscopy;  Laterality: N/A;   EUS N/A 10/09/2020   Procedure: LOWER ENDOSCOPIC ULTRASOUND (EUS);  Surgeon: Irving Copas., MD;  Location: [facility];  Service: Gastroenterology;  Laterality: N/A;   INCISION AND DRAINAGE ABSCESS N/A 06/14/2020   Procedure: INCISION AND DRAINAGE ABSCESS;  Surgeon: Herbert Pun, MD;  Location: [facility];  Service: General;  Laterality: N/A;   INGUINAL HERNIA REPAIR Left    LEFT HEART CATH AND CORONARY ANGIOGRAPHY N/A 01/19/2018   Procedure: LEFT HEART CATH AND CORONARY ANGIOGRAPHY;  Surgeon: Teodoro Spray, MD;  Location: [place] [facility];  Service: Cardiovascular;  Laterality: N/A;   LEFT HEART CATH AND CORONARY ANGIOGRAPHY Left 11/14/1995   Procedure:  CARDIAC CATHETERIZATION (PTCA); Location: [facility]; Surgeon: Doristine Bosworth, MD   LEFT HEART CATH AND CORS/GRAFTS ANGIOGRAPHY Left 07/30/2014   Procedure: CARDIAC CATHETERIZATION; Location: [facility]   PERIPHERAL VASCULAR CATHETERIZATION N/A 09/06/2014   Procedure: IVC Filter Removal;  Surgeon: Katha Cabal, MD;  Location: [facility];  Service: Cardiovascular;  Laterality: N/A;   POLYPECTOMY  10/09/2020   Procedure: POLYPECTOMY;  Surgeon: Rush Landmark Telford Nab., MD;  Location: [facility];  Service: Gastroenterology;;   SUBMUCOSAL LIFTING INJECTION  10/09/2020   Procedure: SUBMUCOSAL LIFTING INJECTION;  Surgeon: Irving Copas., MD;  Location: [facility];  Service: Gastroenterology;;   TRANSURETHRAL RESECTION OF PROSTATE N/A 02/20/2015   Procedure: TRANSURETHRAL RESECTION OF THE PROSTATE (TURP);  Surgeon: Hollice Espy, MD;  Location: [facility];  Service: Urology;  Laterality: N/A;    There were no vitals filed for this visit.   Subjective Assessment - 01/17/21  1532     Subjective  Wearing splint about 3 hrs day total - extention looking good- using it more - not really pain but just cannot touch palm yet    Pertinent History Martin Adkins is a 76 y.o. male who  had suture removal on 10/30/20, - Had on 11/18/20 release of Dupuytren's contracture from the right little finger. Surgery was performed by Dr. Roland Rack. Overall the patient feels that he is doing well, he does still report moderate swelling in the right hand and refer to OT    Patient Stated Goals I want to get the scar better so I can make fist and open my hand to get my hand in my pocket, pick up and hold objects - and push and pull without pain    Currently in Pain? No/denies                [facility] OT Assessment - 01/17/21 0001       Strength   Right Hand Grip (lbs) 55    Right Hand Lateral Pinch 20 lbs    Right Hand 3 Point Pinch 16 lbs    Left Hand Grip (lbs) 75    Left Hand Lateral Pinch 22 lbs     Left Hand 3 Point Pinch 16 lbs      Right Hand AROM   R Little  MCP 0-90 90 Degrees    R Little PIP 0-100 75 Degrees   90 in session   R Little DIP 0-70 50 Degrees             Pt arrive with same AROM at 5th PIP during composite flexion 75-after PROM 85 before paraffin Pt to block MC at 90 and focus on PIP flexion         OT Treatments/Exercises (OP) - 01/17/21 0001       RUE Paraffin   Number Minutes Paraffin 8 Minutes    RUE Paraffin Location Hand    Comments into flexion stretch prior to PROM             Cont to wear hand base extention splint for 4th and 5th digits at 0 degrees - about 3 hrs day total    scar assess and progressing well - more flexible and not as tight -  Pt to cont with scar massage and cica scar pad at night time  New cica scar pad for night time  and then also some use during day - silicon sleeve for 5th digit            PROM to DIP and PIP of 5th  after paraffin followed but PROM composite to touching palm- with pain less than 2/10  Then place and hold in composite fist at 90 PIP flexion and MC 90  Pt ed on doing at home  Followed by med teal putty for gripping 20 reps Add to HEP  Can cont to do rolling over putty or Rolling foam roller for extention  Cont with table slides for composite extention 20 reps     OT Education - 01/17/21 1533     Education Details progress and changes to HEP    Person(s) Educated Patient    Methods Explanation;Demonstration;Tactile cues;Verbal cues;Handout    Comprehension Verbal cues required;Returned demonstration;Verbalized understanding              OT Short Term Goals - 01/05/21 1623       OT SHORT TERM GOAL #1   Title Pt  to be independent in scar management to decrease tenderness and increase AROM in R digits with decrease pain    Baseline scar tender at [facility] and proximal crease and pain increase to 3-5/10 at the most - scar thick and scabs still on 3 places - decrease ext and  flexion    Time 3    Period Weeks    Status Achieved    Target Date 12/06/20               OT Long Term Goals - 01/05/21 1623       OT LONG TERM GOAL #1   Title R grip and prehension strength increase to more than 75% compare to L hand to return to pull ,push door , squeeze washcloth without increase symptoms    Baseline grip NT - decrease AROM -and 4 wks s/p    Time 6    Period Weeks    Status On-going    Target Date 02/07/21      OT LONG TERM GOAL #2   Title R digits AROM increase to [facility] to touch palm to turn doorknob, grip steering wheel and utencils    Baseline Flexion at MC's 80's and PIP at 5th 55 degrees, 4th 85 degrees- unable to grip cylinder objects    Time 6    Period Weeks    Status On-going    Target Date 02/07/21      OT LONG TERM GOAL #3   Title Pt able to maintain extention of 4th and 5th  MC and PIP without splint for month    Baseline MC's extention 5th -30 and 4th -15 degrees - scabs still thick    Time 6    Period Weeks    Status On-going    Target Date 02/07/21                   Plan - 01/17/21 1534     Clinical Impression Statement Pt present at OT  s/p R dupuytrens release on palm and 5th digit into PIP crease on 10/18/20 is about 13 wks s/p - pt made great progress in scar tissue, edema, pain and extention/flexion at 5th digit. Cont to be limited in flexion of 5th PIP - 75 degrees coming in - same as month ago when seen by this OT - focus on PIP and composite flexion this date and reviewed with pt. Pt in clinic increased to 90 degrees of PIP flexion - pt to do HEP for 2wks and add med teal putty for gripping to increase flexion of PIP and grip strength. Goal for pt to show increase PIP flexion during composite fist in 2 wks and possible discharge with homeprogram. Pt report increase functional use of R hand in ADL's and IADL's with less pain and discomfort. Pt continues to benefit from skilled OT services to address ROM, strength and  functional use of hand for daily tasks.    OT Occupational Profile and History Problem Focused Assessment - Including review of records relating to presenting problem    Occupational performance deficits (Please refer to evaluation for details): ADL's;IADL's;Play;Leisure    Body Structure / Function / Physical Skills ADL;Flexibility;ROM;UE functional use;Scar mobility;Edema;Pain;Strength;IADL    Rehab Potential Good    Clinical Decision Making Limited treatment options, no task modification necessary    Comorbidities Affecting Occupational Performance: None    Modification or Assistance to Complete Evaluation  No modification of tasks or assist necessary to complete eval  OT Frequency 1x / week    OT Duration 6 weeks    OT Treatment/Interventions Self-care/ADL training;Patient/family education;Splinting;Paraffin;Fluidtherapy;Contrast Bath;Manual Therapy;Passive range of motion;Scar mobilization;Moist Heat    Consulted and Agree with Plan of Care Patient             Patient will benefit from skilled therapeutic intervention in order to improve the following deficits and impairments:   Body Structure / Function / Physical Skills: ADL, Flexibility, ROM, UE functional use, Scar mobility, Edema, Pain, Strength, IADL       Visit Diagnosis: Muscle weakness (generalized)  Scar condition and fibrosis of skin  Stiffness of right hand, not elsewhere classified  Stiffness of left hand, not elsewhere classified    Problem List Patient Active Problem List   Diagnosis Date Noted   Carotid stenosis 09/26/2020   Primary neuroendocrine carcinoma of rectum ([place]) 08/22/2020   Dupuytren's contracture of left hand 11/02/2018   Sepsis ([place]) 08/27/2018   Non-ST elevation MI (NSTEMI) ([place]) 01/18/2018   AAA (abdominal aortic aneurysm) 09/24/2017   Leaking abdominal aortic aneurysm (AAA) 08/22/2017   CLL (chronic lymphocytic leukemia) ([place]) 05/24/2017   Goals of care, counseling/discussion  05/24/2017   B12 deficiency 03/21/2017   Elevated troponin I level 03/03/2015   Deep venous thrombosis of profunda femoris vein (HCC) 01/23/2015   BPH (benign prostatic hyperplasia) 01/23/2015   Urge urinary incontinence 01/23/2015   Type 2 diabetes mellitus ([place]) 10/31/2014   Benign gastric polyp 10/31/2014   Absence of bladder continence 10/09/2014   Acute non-ST elevation myocardial infarction (NSTEMI) ([place]) 08/15/2014   Atrial fibrillation ([place]) 08/05/2014   Antiphospholipid syndrome ([place]) 08/01/2014   H/O deep venous thrombosis 08/01/2014   Non-ST elevation myocardial infarction (NSTEMI), subendocardial infarction, subsequent episode of care ([place]) 07/30/2014   Barrett esophagus 07/28/2014   Diabetes mellitus, type 2 ([place]) 07/28/2014   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/28/2014   Hyperplastic adenomatous polyp of stomach 07/28/2014   Benign essential HTN 01/77/9390   Chronic systolic heart failure ([place]) 04/19/2014   AAA (abdominal aortic aneurysm) without rupture 04/05/2014   Arteriosclerosis of coronary artery 04/05/2014   Breathlessness on exertion 04/05/2014   Abdominal aortic aneurysm (AAA) without rupture 04/05/2014   Acute non-ST segment elevation myocardial infarction ([place]) 05/14/2013   Acid reflux 05/04/2013   Combined fat and carbohydrate induced hyperlipemia 05/03/2013   Obstructive apnea 05/03/2013   Arthritis, degenerative 05/03/2013   Peripheral blood vessel disorder ([place]) 05/03/2013   Arthritis or polyarthritis, rheumatoid ([place]) 05/03/2013   Compulsive tobacco user syndrome 05/03/2013   Rheumatoid arthritis ([place]) 05/03/2013   Peripheral vascular disease ([place]) 05/03/2013   Current tobacco use 05/03/2013    Rosalyn Gess, OTR/L,CLT 01/17/2021, 3:54 PM  [facility] [address] [place], [address] Phone: [phone]   Fax:  [phone]  Name: Martin [facility]kins MRN: [id number] Date  of Birth: June 30, 1945

## 2021-01-30 ENCOUNTER — Ambulatory Visit: Payer: Medicare PPO | Admitting: Occupational Therapy

## 2021-02-27 ENCOUNTER — Inpatient Hospital Stay: Payer: Medicare PPO | Attending: Oncology

## 2021-02-27 ENCOUNTER — Other Ambulatory Visit: Payer: Self-pay

## 2021-02-27 DIAGNOSIS — G4733 Obstructive sleep apnea (adult) (pediatric): Secondary | ICD-10-CM | POA: Insufficient documentation

## 2021-02-27 DIAGNOSIS — D696 Thrombocytopenia, unspecified: Secondary | ICD-10-CM

## 2021-02-27 DIAGNOSIS — I2510 Atherosclerotic heart disease of native coronary artery without angina pectoris: Secondary | ICD-10-CM | POA: Diagnosis not present

## 2021-02-27 DIAGNOSIS — I4891 Unspecified atrial fibrillation: Secondary | ICD-10-CM | POA: Diagnosis not present

## 2021-02-27 DIAGNOSIS — I252 Old myocardial infarction: Secondary | ICD-10-CM | POA: Insufficient documentation

## 2021-02-27 DIAGNOSIS — I739 Peripheral vascular disease, unspecified: Secondary | ICD-10-CM | POA: Insufficient documentation

## 2021-02-27 DIAGNOSIS — Z86718 Personal history of other venous thrombosis and embolism: Secondary | ICD-10-CM | POA: Insufficient documentation

## 2021-02-27 DIAGNOSIS — Z79899 Other long term (current) drug therapy: Secondary | ICD-10-CM | POA: Diagnosis not present

## 2021-02-27 DIAGNOSIS — C7A8 Other malignant neuroendocrine tumors: Secondary | ICD-10-CM

## 2021-02-27 DIAGNOSIS — M069 Rheumatoid arthritis, unspecified: Secondary | ICD-10-CM | POA: Diagnosis not present

## 2021-02-27 DIAGNOSIS — Z7982 Long term (current) use of aspirin: Secondary | ICD-10-CM | POA: Diagnosis not present

## 2021-02-27 DIAGNOSIS — R634 Abnormal weight loss: Secondary | ICD-10-CM | POA: Diagnosis not present

## 2021-02-27 DIAGNOSIS — K621 Rectal polyp: Secondary | ICD-10-CM | POA: Insufficient documentation

## 2021-02-27 DIAGNOSIS — K219 Gastro-esophageal reflux disease without esophagitis: Secondary | ICD-10-CM | POA: Insufficient documentation

## 2021-02-27 DIAGNOSIS — Z8719 Personal history of other diseases of the digestive system: Secondary | ICD-10-CM | POA: Insufficient documentation

## 2021-02-27 DIAGNOSIS — I110 Hypertensive heart disease with heart failure: Secondary | ICD-10-CM | POA: Diagnosis not present

## 2021-02-27 DIAGNOSIS — R161 Splenomegaly, not elsewhere classified: Secondary | ICD-10-CM | POA: Insufficient documentation

## 2021-02-27 DIAGNOSIS — E785 Hyperlipidemia, unspecified: Secondary | ICD-10-CM | POA: Insufficient documentation

## 2021-02-27 DIAGNOSIS — I429 Cardiomyopathy, unspecified: Secondary | ICD-10-CM | POA: Insufficient documentation

## 2021-02-27 DIAGNOSIS — Z7901 Long term (current) use of anticoagulants: Secondary | ICD-10-CM | POA: Diagnosis not present

## 2021-02-27 DIAGNOSIS — Z9049 Acquired absence of other specified parts of digestive tract: Secondary | ICD-10-CM | POA: Insufficient documentation

## 2021-02-27 DIAGNOSIS — D3501 Benign neoplasm of right adrenal gland: Secondary | ICD-10-CM | POA: Insufficient documentation

## 2021-02-27 DIAGNOSIS — C9110 Chronic lymphocytic leukemia of B-cell type not having achieved remission: Secondary | ICD-10-CM | POA: Diagnosis present

## 2021-02-27 LAB — LACTATE DEHYDROGENASE: LDH: 144 U/L (ref 98–192)

## 2021-02-27 LAB — CBC WITH DIFFERENTIAL/PLATELET
Abs Immature Granulocytes: 0.02 10*3/uL (ref 0.00–0.07)
Basophils Absolute: 0 10*3/uL (ref 0.0–0.1)
Basophils Relative: 0 %
Eosinophils Absolute: 0.1 10*3/uL (ref 0.0–0.5)
Eosinophils Relative: 1 %
HCT: 48 % (ref 39.0–52.0)
Hemoglobin: 15.5 g/dL (ref 13.0–17.0)
Immature Granulocytes: 0 %
Lymphocytes Relative: 52 %
Lymphs Abs: 4.7 10*3/uL — ABNORMAL HIGH (ref 0.7–4.0)
MCH: 28.7 pg (ref 26.0–34.0)
MCHC: 32.3 g/dL (ref 30.0–36.0)
MCV: 88.7 fL (ref 80.0–100.0)
Monocytes Absolute: 1 10*3/uL (ref 0.1–1.0)
Monocytes Relative: 11 %
Neutro Abs: 3.3 10*3/uL (ref 1.7–7.7)
Neutrophils Relative %: 36 %
Platelets: 158 10*3/uL (ref 150–400)
RBC: 5.41 MIL/uL (ref 4.22–5.81)
RDW: 15 % (ref 11.5–15.5)
Smear Review: NORMAL
WBC: 9.2 10*3/uL (ref 4.0–10.5)
nRBC: 0 % (ref 0.0–0.2)

## 2021-02-27 LAB — COMPREHENSIVE METABOLIC PANEL
ALT: 34 U/L (ref 0–44)
AST: 38 U/L (ref 15–41)
Albumin: 4.3 g/dL (ref 3.5–5.0)
Alkaline Phosphatase: 96 U/L (ref 38–126)
Anion gap: 9 (ref 5–15)
BUN: 19 mg/dL (ref 8–23)
CO2: 25 mmol/L (ref 22–32)
Calcium: 9.6 mg/dL (ref 8.9–10.3)
Chloride: 103 mmol/L (ref 98–111)
Creatinine, Ser: 0.83 mg/dL (ref 0.61–1.24)
GFR, Estimated: >60 mL/min (ref >60)
Glucose, Bld: 125 mg/dL — ABNORMAL HIGH (ref 70–99)
Potassium: 3.8 mmol/L (ref 3.5–5.1)
Sodium: 137 mmol/L (ref 135–145)
Total Bilirubin: 1.3 mg/dL — ABNORMAL HIGH (ref 0.3–1.2)
Total Protein: 7.6 g/dL (ref 6.5–8.1)

## 2021-02-28 DIAGNOSIS — C9110 Chronic lymphocytic leukemia of B-cell type not having achieved remission: Secondary | ICD-10-CM | POA: Diagnosis not present

## 2021-03-01 ENCOUNTER — Other Ambulatory Visit: Payer: Self-pay

## 2021-03-01 DIAGNOSIS — C7A8 Other malignant neuroendocrine tumors: Secondary | ICD-10-CM

## 2021-03-01 LAB — COMP PANEL: LEUKEMIA/LYMPHOMA: Immunophenotypic Profile: 42

## 2021-03-01 LAB — CHROMOGRANIN A: Chromogranin A (ng/mL): 283.3 ng/mL — ABNORMAL HIGH (ref 0.0–101.8)

## 2021-03-02 ENCOUNTER — Inpatient Hospital Stay (HOSPITAL_BASED_OUTPATIENT_CLINIC_OR_DEPARTMENT_OTHER): Payer: Medicare PPO | Admitting: Oncology

## 2021-03-02 ENCOUNTER — Other Ambulatory Visit: Payer: Self-pay

## 2021-03-02 ENCOUNTER — Encounter: Payer: Self-pay | Admitting: Oncology

## 2021-03-02 VITALS — BP 132/85 | HR 77 | Temp 96.8°F | Resp 18 | Wt 218.2 lb

## 2021-03-02 DIAGNOSIS — C9110 Chronic lymphocytic leukemia of B-cell type not having achieved remission: Secondary | ICD-10-CM

## 2021-03-02 DIAGNOSIS — Z7901 Long term (current) use of anticoagulants: Secondary | ICD-10-CM

## 2021-03-02 DIAGNOSIS — D696 Thrombocytopenia, unspecified: Secondary | ICD-10-CM

## 2021-03-02 DIAGNOSIS — C7A8 Other malignant neuroendocrine tumors: Secondary | ICD-10-CM

## 2021-03-02 NOTE — Progress Notes (Signed)
Pt here for follow up. Pt reports that he has been having some wheezing and shortness of breath. Pt also reports increased frequent headaches and fatigue.

## 2021-03-02 NOTE — Progress Notes (Signed)
Hematology/Oncology Progress note [phone] [phone]      Patient Care Team: [name], MD as PCP - General (Internal Medicine) Earlie Server, MD as Medical Oncologist (Medical Oncology)  REFERRING PROVIDER: [name], MD  REASON FOR VISIT Follow up for management of CLL, Rectal NET HISTORY OF PRESENTING ILLNESS:  Martin Adkins is a  76 y.o.  male with PMH listed below who was referred to me for evaluation of lymphocytosis.  Recent lab work on 03/05/2017 showed wbc 11.4, hb 14.6, platelet count 117,000, lymphocytosis 63.5%, neutrophil 22%, Report chronic fatigue, no weight loss, fever or chills.  He reports feeling chest "rattleness". Has for CT chest which showed acute finding, chronic finding includes  postsurgical changes consistent with coronary bypass grafting. One of the bypass grafts arising from the aorta shows apparent thrombosis and an area of distal aneurysmal dilatation which is also Thrombosed. Right adrenal adenoma stable in appearance. Mild scarring in the lung bases.Fatty liver.   Takes Aspirin 82m, coumadine, plavix. He is on anticoagulation for previous history of clots.  Denies any bleeding events. Use to drink hard liquir daily, quitted for 4-5 years. Current drinks wine on weekend.    Image Studies # 04/04/2017  [place] showed 1.  Splenomegaly.  No focal splenic lesions evident. 2. Increased liver echogenicity, a finding most likely indicative of hepatic steatosis. While no focal liver lesions are evident on this study, it must be cautioned that the sensitivity of ultrasound for detection of focal liver lesions is diminished in this circumstance.3. Pancreas obscured by gas. Portions of inferior vena cava obscured by gas.4.  Gallbladder absent.5. Status post abdominal aortic aneurysm repair. No periaortic fluid.  # 04/21/2017 PET scan 1. Moderate splenomegaly with uniform splenic hypermetabolism,compatible with the provided  history of lymphoproliferative disease.No additional hypermetabolic sites of lymphoproliferative disease. Specifically, no hypermetabolic lymphadenopathy Hepatic steatosis, aortic atherosclerosis. Aneurysm.    # CLL, stage IV disease given spelencemagly, thrombocytopenia, symptomatic with fatigue and weight loss.  CLL IPL score 4, high risk.   # status post abdominal aortic aneurysm repair on 09/25/2017 s/p cardiac cath which revealed patent LIMA to the LAD, no significant disease in the RCA with a 90% stenosis in a small RV marginal branch.  Occluded left circumflex, occluded LAD.  Saphenous vein grafts are all occluded.  RCA does not require grafting.  LAD is well grafted by the left internal mammary.  Medical management  07/24/2020, colonoscopy showed diverticulosis in the sigmoid colon.  Multiple polyps were removed from descending colon, proximal ascending colon, transverse colon, sigmoid colon and 5 small 2-476mpolyps resected from rectum. Polyps from the colon are tubular adenoma, negative for high-grade dysplasia and malignancy. Polyps from rectum showed well differentiated neuroendocrine tumor, grade 1, involving the base of the submitted tissue-1 fragment, hyperplastic polyp 2 fragments.   INTERVAL HISTORY Martin Adkins a [age].o. male who has above presents for follow-up of CLL, rectal neuroendocrine tumor. He has been off venetoclax since 11/11/2017. 10/09/2020 EUS by Dr.Mansouraty  FLEX Impression: Stool in the sigmoid colon and in the descending colon. - Preparation of the colon was fair even after significant lavage - however large stool began to come through the rest of the colon as procedure continued. - >30 1 to 5 mm polyps in the rectum, at the recto-sigmoid colon and in the sigmoid colon. 16 of the polyps which were in the rectum were removed with a cold snare. Resected and retrieved.- Non-bleeding non-thrombosed external and  internal hemorrhoids  EUS Impression: -  Endosonographic imaging showed no sign of significant pathology at the rectosigmoid junction. - Endosonographic images of the rectum were unremarkable. - Endosonographic images of the perirectal space were unremarkable. - No malignant-appearing lymph nodes were visualized endosonographically in the perirectal region and in the left iliac region. - The internal anal sphincter was visualized endosonographically and appeared normal  Dr.Mansouraty recommend repeat 1 year Flex sigmoidoscopy with EUS  Patient denies unintentional weight loss, night sweats, fever.  Appetite is good. No new complaints. He takes protonix long term.   Review of Systems  Constitutional:  Negative for appetite change, chills, fatigue, fever and unexpected weight change.  HENT:   Negative for hearing loss and voice change.   Eyes:  Negative for eye problems and icterus.  Respiratory:  Negative for chest tightness, cough, shortness of breath and wheezing.   Cardiovascular:  Negative for chest pain and leg swelling.  Gastrointestinal:  Negative for abdominal distention and abdominal pain.  Endocrine: Negative for hot flashes.  Genitourinary:  Negative for difficulty urinating, dysuria and frequency.   Musculoskeletal:  Negative for arthralgias and back pain.  Skin:  Negative for itching and rash.  Neurological:  Positive for numbness. Negative for dizziness, headaches and light-headedness.  Hematological:  Negative for adenopathy. Does not bruise/bleed easily.  Psychiatric/Behavioral:  Negative for confusion.     MEDICAL HISTORY:  Past Medical History:  Diagnosis Date   AAA (abdominal aortic aneurysm) without rupture    a.) [place] 02/09/04 - 3.1 x 3.4 cm. b.) [place] 02/14/05 - 3.62 x 3.63 cm. c.) [place] 02/25/06 - 3.97 x 4.0 cm. d.) CT 07/09/11 - 4.9 cm. e.) s/p EVAR 2013. f.) enlarging AAA --> back to the OR on 09/24/2017 for placement of a 27 mm x 12 cm RIGHT iliac extension limb down to distal CIA.   Aortic atherosclerosis  (HCC)    APS (antiphospholipid syndrome) (HCC)    Arthritis    Atrial fibrillation (HCC)    a.) CHA2DS2-VASc Score = 6 (age, CHF, HTN, prior DVT x 2, prior MI). b.) on rivaroxaban   B12 deficiency 03/21/2017   Barrett's esophagus    BPH (benign prostatic hyperplasia)    CAD (coronary artery disease)    a.) LHC 11/14/95 --> 95% mLCx, 95% OM1; PTCA. b.) 4v CABG 2004 --> LIMA-LAD, SVG-D1,RCA,OM. c.) NSTEMI 05/02/2013 --> PCI with DES x 3 to SVG-OM in 2 distinct areas with filter. d.) Inf STEMI 07/30/14 - LHC --> 50% LM, 90% mLCx, 100% OM2, 100% RPDA; LIMA-LAD patent; 100% occ of SVG OM2,OM3,PDA; med mgmt. e.) LHC 01/19/18 - patent LIMA-LAD; other grafts occ; not amenable to PCI/further bypass.   Cardiomyopathy ([place])    Chronic anticoagulation    a.) Rivaroxaban   CLL (chronic lymphocytic leukemia) ([place]) 05/24/2017   DVT (deep venous thrombosis) (HCC)    GERD (gastroesophageal reflux disease)    HFrEF (heart failure with reduced ejection fraction) ([place])    a.) TTE 02/22/2020 --> mod. LV systolic dysfunction; LVEF 35-40%.   History of hiatal hernia    History of shingles 2013   Hyperlipemia    Hyperplastic polyps of stomach    a.) Bx on 07/24/2020 --> well differentiated NET; WHO grade I.   Hypertension    NSTEMI (non-ST elevated myocardial infarction) ([place]) 05/02/2013   a.) LHC 05/03/2013 --> 50% LM, 100% LAD, 75% mLCx, 100% dLCx, 60% mRCA; patient LIMA-LAD, occluded SVG-D1 and SVG-PDA. SVG-OM with extensive thrombus and distal 99% lesion. HIGH RISK PCI  performed at [facility] with placement of DES x 3 to 2 distinct areas of SVG with filter protection.   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  b.) Stage I (cT1, cN0, cM0)   PVD (peripheral vascular disease) (HCC)    RA (rheumatoid arthritis) (HCC)     S/P CABG x 4 01/21/2002   a.) 4v; LIMA-LAD, SVG-D1, SVG-RCA, SVG-OM   SOB (shortness of breath)    ST elevation myocardial infarction (STEMI) of inferior wall ([place]) 07/30/2014   a.) LHC --> 50% LM, 90% mLCx, 100% OM2, 100% RPDA; LIMA-LAD patent; 100% occlusions of SVG OM2, OM3, PDA. No intervention; medical management.   Valvular regurgitation    a.) TTE 02/22/2020 --> LVEF 35-40%; mild panvalvular.    SURGICAL HISTORY: Past Surgical History:  Procedure Laterality Date   ABDOMINAL AORTA STENT     CHOLECYSTECTOMY     COLONOSCOPY  11/24/2009   COLONOSCOPY N/A 10/09/2020   Procedure: COLONOSCOPY;  Surgeon: Mansouraty, Telford Nab., MD;  Location: [facility];  Service: Gastroenterology;  Laterality: N/A;   COLONOSCOPY WITH PROPOFOL N/A 07/24/2020   Procedure: COLONOSCOPY WITH PROPOFOL;  Surgeon: Toledo, Benay Pike, MD;  Location: [facility] ENDOSCOPY;  Service: Gastroenterology;  Laterality: N/A;   CORONARY ANGIOPLASTY WITH STENT PLACEMENT Left 05/03/2013   Procedure: CORONARY ANGIOPLASTY WITH STENT PLACEMENT (DES x 3 to SVG-OM graft); Location: [facility]   CORONARY ARTERY BYPASS GRAFT N/A 01/21/2002   Procedure: 4V CABG (LIMA-LAD, SVG-D1, SVG-RCA, SVG-OM); Location: [facility]   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   Procedure: DUPUYTREN CONTRACTURE RELEASE LEFT LONG FINGER;  Surgeon: Corky Mull, MD;  Location: [facility] ORS;  Service: Orthopedics;  Laterality: Left;   DUPUYTREN CONTRACTURE RELEASE Right 10/18/2020   Procedure: DUPUYTREN CONTRACTURE RELEASE;  Surgeon: Corky Mull, MD;  Location: [facility] ORS;  Service: Orthopedics;  Laterality: Right;   ENDOVASCULAR REPAIR/STENT GRAFT N/A 09/24/2017   Procedure: ENDOVASCULAR REPAIR/STENT GRAFT (27 mm x 12 cm RIGHT iliac limb extension to distal CIA);  Surgeon: Algernon Huxley, MD;  Location: [facility];  Service: Cardiovascular;  Laterality: N/A;   ESOPHAGOGASTRODUODENOSCOPY  05/26/02  03/23/12   ESOPHAGOGASTRODUODENOSCOPY (EGD) WITH PROPOFOL  N/A 10/28/2016   Procedure: ESOPHAGOGASTRODUODENOSCOPY (EGD) WITH PROPOFOL;  Surgeon: Manya Silvas, MD;  Location: [facility] END[facility]COPY;  Service: Endoscopy;  Laterality: N/A;   EUS N/A 10/09/2020   Procedure: LOWER ENDOSCOPIC ULTRASOUND (EUS);  Surgeon: Irving Copas., MD;  Location: [facility];  Service: Gastroenterology;  Laterality: N/A;   INCISION AND DRAINAGE ABSCESS N/A 06/14/2020   Procedure: INCISION AND DRAINAGE ABSCESS;  Surgeon: Herbert Pun, MD;  Location: [facility] ORS;  Service: General;  Laterality: N/A;   INGUINAL HERNIA REPAIR Left    LEFT HEART CATH AND CORONARY ANGIOGRAPHY N/A 01/19/2018   Procedure: LEFT HEART CATH AND CORONARY ANGIOGRAPHY;  Surgeon: Teodoro Spray, MD;  Location: [facility];  Service: Cardiovascular;  Laterality: N/A;   LEFT HEART CATH AND CORONARY ANGIOGRAPHY Left 11/14/1995   Procedure: CARDIAC CATHETERIZATION (PTCA); Location: [facility]; Surgeon: Doristine Bosworth, MD   LEFT HEART CATH AND CORS/GRAFTS ANGIOGRAPHY Left 07/30/2014   Procedure: CARDIAC CATHETERIZATION; Location: [facility]   PERIPHERAL VASCULAR CATHETERIZATION N/A 09/06/2014   Procedure: IVC Filter Removal;  Surgeon: Katha Cabal, MD;  Location: [facility]IVE [facility]  [facility];  Service: Cardiovascular;  Laterality: N/A;   POLYPECTOMY  10/09/2020   Procedure: POLYPECTOMY;  Surgeon: Rush Landmark Telford Nab., MD;  Location: [facility];  Service: Gastroenterology;;   SUBMUCOSAL LIFTING INJECTION  10/09/2020   Procedure: SUBMUCOSAL LIFTING INJECTION;  Surgeon: Irving Copas., MD;  Location: [facility];  Service: Gastroenterology;;   TRANSURETHRAL RESECTION OF PROSTATE N/A 02/20/2015   Procedure: TRANSURETHRAL RESECTION OF THE PROSTATE (TURP);  Surgeon: Hollice Espy, MD;  Location: [facility] ORS;  Service: Urology;  Laterality: N/A;    SOCIAL HISTORY: Social History   Socioeconomic History   Marital status: Legally Separated    Spouse name: Not on file   Number of children:  Not on file   Years of education: Not on file   Highest education level: Not on file  Occupational History   Not on file  Tobacco Use   Smoking status: Some Days    Packs/day: 0.01    Years: 20.00    Pack years: 0.20    Types: Cigarettes   Smokeless tobacco: Never   Tobacco comments:    sometimes 2-3 a day  Vaping Use   Vaping Use: Never used  Substance and Sexual Activity   Alcohol use: Yes    Alcohol/week: 1.0 - 2.0 standard drink    Types: 1 - 2 Shots of liquor per week    Comment: casual   Drug use: No   Sexual activity: Never  Other Topics Concern   Not on file  Social History Narrative   Not on file   Social Determinants of Health   Financial Resource Strain: Not on file  Food Insecurity: Not on file  Transportation Needs: Not on file  Physical Activity: Not on file  Stress: Not on file  Social Connections: Not on file  Intimate Partner Violence: Not on file    FAMILY HISTORY: Family History  Problem Relation Age of Onset   Cancer Mother 14       BREAST   Diabetes Mother    Coronary artery disease Father    Heart attack Father    Prostate cancer Brother    Bladder Cancer Neg Hx    Kidney cancer Neg Hx     ALLERGIES:  is allergic to ace inhibitors, amoxicillin, niacin, penicillins, pravastatin, rosuvastatin, and simvastatin.  MEDICATIONS:  Current Outpatient Medications  Medication Sig Dispense Refill   amLODipine (NORVASC) 10 MG tablet Take 10 mg by mouth daily.      atorvastatin (LIPITOR) 80 MG tablet Take 80 mg by mouth daily.   11   Baclofen 5 MG TABS Take by mouth.     Cholecalciferol (VITAMIN D3) 50 MCG (2000 UT) CAPS Take 2,000 Units/day by mouth daily.     dorzolamide (TRUSOPT) 2 % ophthalmic solution Place 1 drop into both eyes 2 (two) times daily.      fluticasone (FLONASE) 50 MCG/ACT nasal spray Place 2 sprays into both nostrils daily.      gabapentin (NEURONTIN) 300 MG capsule Take 1 capsule (300 mg total) by mouth 3 (three) times daily.  (Patient taking differently: Take 300 mg by mouth 2 (two) times daily.) 90 capsule 0   HYDROcodone-acetaminophen (NORCO) 5-325 MG tablet Take 1-2 tablets by mouth every 6 (six) hours as needed for moderate pain or severe pain. MAXIMUM TOTAL ACETAMINOPHEN DOSE IS 4000 MG PER DAY 20 tablet 0   isosorbide mononitrate (IMDUR) 60 MG 24 hr tablet Take 1 tablet (60 mg total) by mouth daily. 30 tablet 0  latanoprost (XALATAN) 0.005 % ophthalmic solution Place 1 drop into both eyes at bedtime.      losartan (COZAAR) 25 MG tablet Take 25 mg by mouth daily.     metoprolol succinate (TOPROL-XL) 100 MG 24 hr tablet Take 100 mg by mouth daily.      Multiple Vitamins-Minerals (MULTIVITAMIN MEN 50+ PO) Take 1 tablet by mouth daily.     pantoprazole (PROTONIX) 40 MG tablet Take 40 mg by mouth daily.      potassium chloride SA (K-DUR,KLOR-CON) 20 MEQ tablet Take 20 mEq by mouth daily.      rivaroxaban (XARELTO) 20 MG TABS tablet Take 1 tablet (20 mg total) by mouth daily with supper. 30 tablet 3   No current facility-administered medications for this visit.     PHYSICAL EXAMINATION: ECOG 1 Vitals:   03/02/21 0957  BP: 132/85  Pulse: 77  Resp: 18  Temp: (!) 96.8 F (36 C)  SpO2: 100%  Physical Exam Vitals and nursing note reviewed.  Constitutional:      General: He is not in acute distress.    Appearance: He is not diaphoretic.  HENT:     Head: Normocephalic and atraumatic.     Nose: Nose normal.     Mouth/Throat:     Pharynx: No oropharyngeal exudate.  Eyes:     General: No scleral icterus.       Left eye: No discharge.     Pupils: Pupils are equal, round, and reactive to light.  Neck:     Vascular: No JVD.  Cardiovascular:     Rate and Rhythm: Normal rate and regular rhythm.     Heart sounds: Normal heart sounds. No murmur heard. Pulmonary:     Effort: Pulmonary effort is normal. No respiratory distress.     Breath sounds: No wheezing or rales.  Chest:     Chest wall: No tenderness.   Abdominal:     General: There is no distension.     Palpations: Abdomen is soft.     Tenderness: There is no abdominal tenderness.  Musculoskeletal:        General: No deformity. Normal range of motion.     Cervical back: Normal range of motion and neck supple.  Lymphadenopathy:     Cervical: No cervical adenopathy.  Skin:    General: Skin is warm and dry.     Findings: No erythema.  Neurological:     Mental Status: He is alert and oriented to person, place, and time.     Cranial Nerves: No cranial nerve deficit.     Motor: No abnormal muscle tone.     Coordination: Coordination normal.  Psychiatric:        Mood and Affect: Affect normal.     LABORATORY DATA:  I have reviewed the data as listed Lab Results  Component Value Date   WBC 9.2 02/27/2021   HGB 15.5 02/27/2021   HCT 48.0 02/27/2021   MCV 88.7 02/27/2021   PLT 158 02/27/2021   Recent Labs    04/03/20 0951 08/22/20 1343 02/27/21 1113  NA 140 139 137  K 3.7 3.6 3.8  CL 104 106 103  CO2 '25 25 25  ' GLUCOSE 132* 122* 125*  BUN '18 14 19  ' CREATININE 0.76 0.84 0.83  CALCIUM 8.9 9.2 9.6  GFRNONAA >60 >60 >60  PROT 7.1 7.1 7.6  ALBUMIN 4.2 4.2 4.3  AST 46* 42* 38  ALT 45* 32 34  ALKPHOS 77 83 96  BILITOT  1.6* 1.7* 1.3*    Hepatitis panel negative.  LDH 228, mildly elevated Beta 2 microglobulin normal.   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 The morphologic and immunophenotypic features are most consistent with chronic lymphocytic leukemia/small lymphocytic lymphoma. Bone marrow Cytogenetics : normal.  Peripheral blood FISH panel negative for CCND1- IGH mutation, ATM, 12, 13q, Tp53 mutation.   RADIOGRAPHIC STUDIES: I have personally reviewed the radiological images as listed and agreed with the findings in the report. CT angio abd/pelvis showed Type 1b endoleak, AAA aneurysm, Spleen has decreased in size since PET scan in April 2019.   ASSESSMENT  & PLAN:   1. Primary neuroendocrine carcinoma of rectum ([place])   2. CLL (chronic lymphocytic leukemia) ([place])   3. Long term current use of anticoagulant therapy   4. Thrombocytopenia ([place])    Cancer Staging  Primary neuroendocrine carcinoma of rectum ([place]) Staging form: Colon and Rectum, AJCC 8th Edition - Clinical: Stage I (cT1, cN0, cM0) - Signed by Earlie Server, MD on 08/24/2020  #CLL, stable.  Patient has been in remission.  Off treatment since November 2019. Lymphocytosis 4.7, 02/27/2021 peripheral blood flowcytometry showed CD5+, CD23- clonal B cells, <5000/ul. 42% of the leukocytes Early recurrence. He is asymptomatic. Continue watchful observation.   #Atrial fibrillation, on chronic anticoagulation with Xarelto.  Follow-up with cardiology. #Thrombocytopenia, stable.  158000  #Stage I T1N0 rectum neuroendocrine tumor MRI pelvis without contrast showed no evidence of rectal primary or polyps identified.  I clarified with radiology Dr. Jobe Igo.  An addendum was added that the T staging [ post resection] should be T0, no muscularis propia invasion.  10/2021 Flex sigmoidoscopy by Dr.Mansouraty did not reveal significant pathology at the rectosigmoid junction.  He will repeat Flex sigmoidoscopy in Oct 2022.   Patient's chromogranin A level is elevated, which is likely due to being on PPI. Recommend him to stop protonix for 2 weeks and repeat level. Also recommend him to hold PPI for 2 weeks prior to his blood work in 6 months.   Repeat   6 months, lab cbc cmp flowcytometry, LDH, chromogranin A  Earlie Server, MD, PhD Hematology Oncology  03/02/2021

## 2021-03-06 LAB — 5 HIAA, QUANTITATIVE, URINE, 24 HOUR
5-HIAA, Ur: 4 mg/L
5-HIAA,Quant.,24 Hr Urine: 6 mg/(24.h) (ref 0.0–14.9)
Total Volume: 1500

## 2021-03-19 ENCOUNTER — Inpatient Hospital Stay: Payer: Medicare PPO | Attending: Oncology

## 2021-03-19 ENCOUNTER — Other Ambulatory Visit: Payer: Self-pay

## 2021-03-19 DIAGNOSIS — C7A8 Other malignant neuroendocrine tumors: Secondary | ICD-10-CM | POA: Insufficient documentation

## 2021-03-19 DIAGNOSIS — C9110 Chronic lymphocytic leukemia of B-cell type not having achieved remission: Secondary | ICD-10-CM | POA: Diagnosis not present

## 2021-03-19 DIAGNOSIS — Z7901 Long term (current) use of anticoagulants: Secondary | ICD-10-CM | POA: Insufficient documentation

## 2021-03-19 DIAGNOSIS — C20 Malignant neoplasm of rectum: Secondary | ICD-10-CM | POA: Diagnosis present

## 2021-03-19 DIAGNOSIS — D696 Thrombocytopenia, unspecified: Secondary | ICD-10-CM | POA: Diagnosis not present

## 2021-03-19 LAB — COMPREHENSIVE METABOLIC PANEL
ALT: 32 U/L (ref 0–44)
AST: 31 U/L (ref 15–41)
Albumin: 4.5 g/dL (ref 3.5–5.0)
Alkaline Phosphatase: 93 U/L (ref 38–126)
Anion gap: 7 (ref 5–15)
BUN: 20 mg/dL (ref 8–23)
CO2: 26 mmol/L (ref 22–32)
Calcium: 9.1 mg/dL (ref 8.9–10.3)
Chloride: 104 mmol/L (ref 98–111)
Creatinine, Ser: 0.84 mg/dL (ref 0.61–1.24)
GFR, Estimated: >60 mL/min (ref >60)
Glucose, Bld: 117 mg/dL — ABNORMAL HIGH (ref 70–99)
Potassium: 3.6 mmol/L (ref 3.5–5.1)
Sodium: 137 mmol/L (ref 135–145)
Total Bilirubin: 1.8 mg/dL — ABNORMAL HIGH (ref 0.3–1.2)
Total Protein: 7.6 g/dL (ref 6.5–8.1)

## 2021-03-19 LAB — CBC WITH DIFFERENTIAL/PLATELET
Abs Immature Granulocytes: 0.02 10*3/uL (ref 0.00–0.07)
Basophils Absolute: 0.1 10*3/uL (ref 0.0–0.1)
Basophils Relative: 1 %
Eosinophils Absolute: 0.1 10*3/uL (ref 0.0–0.5)
Eosinophils Relative: 1 %
HCT: 48.9 % (ref 39.0–52.0)
Hemoglobin: 15.7 g/dL (ref 13.0–17.0)
Immature Granulocytes: 0 %
Lymphocytes Relative: 52 %
Lymphs Abs: 5.4 10*3/uL — ABNORMAL HIGH (ref 0.7–4.0)
MCH: 28.8 pg (ref 26.0–34.0)
MCHC: 32.1 g/dL (ref 30.0–36.0)
MCV: 89.7 fL (ref 80.0–100.0)
Monocytes Absolute: 1.1 10*3/uL — ABNORMAL HIGH (ref 0.1–1.0)
Monocytes Relative: 11 %
Neutro Abs: 3.7 10*3/uL (ref 1.7–7.7)
Neutrophils Relative %: 35 %
Platelets: 151 10*3/uL (ref 150–400)
RBC: 5.45 MIL/uL (ref 4.22–5.81)
RDW: 15.3 % (ref 11.5–15.5)
Smear Review: NORMAL
WBC: 10.3 10*3/uL (ref 4.0–10.5)
nRBC: 0 % (ref 0.0–0.2)

## 2021-03-22 LAB — CHROMOGRANIN A: Chromogranin A (ng/mL): 68.4 ng/mL (ref 0.0–101.8)

## 2021-04-03 ENCOUNTER — Telehealth: Payer: Self-pay

## 2021-07-20 ENCOUNTER — Emergency Department: Payer: Medicare PPO

## 2021-07-20 ENCOUNTER — Other Ambulatory Visit: Payer: Self-pay

## 2021-07-20 ENCOUNTER — Emergency Department
Admission: EM | Admit: 2021-07-20 | Discharge: 2021-07-20 | Disposition: A | Discharge status: Home / Self Care | Payer: Medicare PPO | Attending: Emergency Medicine | Admitting: Emergency Medicine

## 2021-07-20 DIAGNOSIS — E119 Type 2 diabetes mellitus without complications: Secondary | ICD-10-CM | POA: Insufficient documentation

## 2021-07-20 DIAGNOSIS — Z7901 Long term (current) use of anticoagulants: Secondary | ICD-10-CM | POA: Insufficient documentation

## 2021-07-20 DIAGNOSIS — I10 Essential (primary) hypertension: Secondary | ICD-10-CM | POA: Insufficient documentation

## 2021-07-20 DIAGNOSIS — R0789 Other chest pain: Secondary | ICD-10-CM | POA: Diagnosis present

## 2021-07-20 LAB — BASIC METABOLIC PANEL
Anion gap: 9 (ref 5–15)
BUN: 15 mg/dL (ref 8–23)
CO2: 26 mmol/L (ref 22–32)
Calcium: 9.1 mg/dL (ref 8.9–10.3)
Chloride: 107 mmol/L (ref 98–111)
Creatinine, Ser: 1.03 mg/dL (ref 0.61–1.24)
GFR, Estimated: >60 mL/min (ref >60)
Glucose, Bld: 124 mg/dL — ABNORMAL HIGH (ref 70–99)
Potassium: 3.6 mmol/L (ref 3.5–5.1)
Sodium: 142 mmol/L (ref 135–145)

## 2021-07-20 LAB — HEPATIC FUNCTION PANEL
ALT: 33 U/L (ref 0–44)
AST: 33 U/L (ref 15–41)
Albumin: 4.6 g/dL (ref 3.5–5.0)
Alkaline Phosphatase: 91 U/L (ref 38–126)
Bilirubin, Direct: 0.3 mg/dL — ABNORMAL HIGH (ref 0.0–0.2)
Indirect Bilirubin: 1.4 mg/dL — ABNORMAL HIGH (ref 0.3–0.9)
Total Bilirubin: 1.7 mg/dL — ABNORMAL HIGH (ref 0.3–1.2)
Total Protein: 7.5 g/dL (ref 6.5–8.1)

## 2021-07-20 LAB — CBC
HCT: 46 % (ref 39.0–52.0)
Hemoglobin: 14.5 g/dL (ref 13.0–17.0)
MCH: 28.3 pg (ref 26.0–34.0)
MCHC: 31.5 g/dL (ref 30.0–36.0)
MCV: 89.7 fL (ref 80.0–100.0)
Platelets: 130 10*3/uL — ABNORMAL LOW (ref 150–400)
RBC: 5.13 MIL/uL (ref 4.22–5.81)
RDW: 15.2 % (ref 11.5–15.5)
WBC: 9.2 10*3/uL (ref 4.0–10.5)
nRBC: 0 % (ref 0.0–0.2)

## 2021-07-20 LAB — URINALYSIS, ROUTINE W REFLEX MICROSCOPIC
Bilirubin Urine: NEGATIVE
Glucose, UA: NEGATIVE mg/dL
Hgb urine dipstick: NEGATIVE
Ketones, ur: NEGATIVE mg/dL
Leukocytes,Ua: NEGATIVE
Nitrite: NEGATIVE
Protein, ur: NEGATIVE mg/dL
Specific Gravity, Urine: 1.019 (ref 1.005–1.030)
pH: 7 (ref 5.0–8.0)

## 2021-07-20 LAB — TROPONIN I (HIGH SENSITIVITY)
Troponin I (High Sensitivity): 9 ng/L (ref <18)
Troponin I (High Sensitivity): 8 ng/L (ref <18)

## 2021-07-20 LAB — LIPASE, BLOOD: Lipase: 57 U/L — ABNORMAL HIGH (ref 11–51)

## 2021-07-20 MED ORDER — METOCLOPRAMIDE HCL 10 MG PO TABS
10.0000 mg | ORAL_TABLET | Freq: Once | ORAL | Status: AC
  Administered 2021-07-20: 10 mg via ORAL
  Filled 2021-07-20: qty 1

## 2021-07-20 MED ORDER — ALUM & MAG HYDROXIDE-SIMETH 200-200-20 MG/5ML PO SUSP
30.0000 mL | Freq: Once | ORAL | Status: AC
  Administered 2021-07-20: 30 mL via ORAL
  Filled 2021-07-20: qty 30

## 2021-07-20 MED ORDER — FAMOTIDINE 20 MG PO TABS
40.0000 mg | ORAL_TABLET | Freq: Once | ORAL | Status: AC
  Administered 2021-07-20: 40 mg via ORAL
  Filled 2021-07-20: qty 2

## 2021-07-20 NOTE — Discharge Instructions (Signed)
Your EKG, chest x-ray, and lab tests are all reassuring today.  Please follow-up with your doctor this week for further evaluation of your symptoms.

## 2021-07-20 NOTE — ED Notes (Signed)
Repeat labs sent.

## 2021-07-20 NOTE — ED Provider Notes (Signed)
[facility] Provider Note    Event Date/Time   First MD Initiated Contact with Patient 07/20/21 1433     (approximate)   History   Chief Complaint: Shortness of Breath and Chest Pain   HPI  Martin Adkins is a 76 y.o. male with a history of hypertension, diabetes, DVT, CLL, heart failure, on Eliquis who comes to the ED complaining of intermittent burning/tightness feeling across the anterior chest over the past 3 weeks.  Last several minutes at a time.  Associated with a feeling of shortness of breath and feeling like he has something stuck in his throat that he needs to cough up.  Not exertional, not pleuritic.  No orthopnea PND or dyspnea on exertion.  Reports that he has continued mowing his lawn and has no symptoms during that activity.  No fevers or chills or vomiting.  Denies history of COPD or asthma.  Reports compliance with his medications.  Reports that his CLL was treated long ago and has remained in remission.     Physical Exam   Triage Vital Signs: ED Triage Vitals [07/20/21 1113]  Enc Vitals Group     BP (!) 151/81     Pulse Rate 76     Resp 17     Temp 98.4 F (36.9 C)     Temp Source Oral     SpO2 97 %     Weight      Height 6' (1.829 m)     Head Circumference      Peak Flow      Pain Score 6     Pain Loc      Pain Edu?      Excl. in [place]?     Most recent vital signs: Vitals:   07/20/21 1113 07/20/21 1424  BP: (!) 151/81 134/81  Pulse: 76 61  Resp: 17 18  Temp: 98.4 F (36.9 C)   SpO2: 97% 98%    General: Awake, no distress.  CV:  Good peripheral perfusion.  Regular rate and rhythm.  No murmurs Resp:  Normal effort.  Clear to auscultation bilaterally.  No inducible wheezing or cough with FEV1 maneuver. Abd:  No distention.  Soft with mild left upper quadrant tenderness.  Mild suprapubic tenderness. Other:  No lower extremity edema.  Moist oral mucosa.  No lymphadenopathy.   ED Results / Procedures / Treatments    Labs (all labs ordered are listed, but only abnormal results are displayed) Labs Reviewed  BASIC METABOLIC PANEL - Abnormal; Notable for the following components:      Result Value   Glucose, Bld 124 (*)    All other components within normal limits  CBC - Abnormal; Notable for the following components:   Platelets 130 (*)    All other components within normal limits  LIPASE, BLOOD  HEPATIC FUNCTION PANEL  URINALYSIS, ROUTINE W REFLEX MICROSCOPIC  TROPONIN I (HIGH SENSITIVITY)  TROPONIN I (HIGH SENSITIVITY)     EKG Interpreted by me Normal sinus rhythm, rate of 80.  Normal axis, normal intervals.  There are inferior Q waves.  Normal ST segments and T waves.  No ischemic changes.   RADIOLOGY Chest x-ray interpreted by me, appears normal without pneumothorax pneumonia effusion or edema.  Radiology report reviewed   PROCEDURES:  Procedures   MEDICATIONS ORDERED IN ED: Medications  alum & mag hydroxide-simeth (MAALOX/MYLANTA) 200-200-20 MG/5ML suspension 30 mL (30 mLs Oral Given 07/20/21 1459)  famotidine (PEPCID) tablet 40 mg (40  mg Oral Given 07/20/21 1459)  metoCLOPramide (REGLAN) tablet 10 mg (10 mg Oral Given 07/20/21 1459)     IMPRESSION / MDM / ASSESSMENT AND PLAN / ED COURSE  I reviewed the triage vital signs and the nursing notes.                              Differential diagnosis includes, but is not limited to, GERD, chest wall spasm, non-STEMI, pneumonia, pancreatitis, biliary disease  Patient's presentation is most consistent with acute presentation with potential threat to life or bodily function.  Patient presents with atypical chest pain.  No anginal symptoms.  Low suspicion for ACS PE dissection pericardial effusion.  Chest x-ray and EKG are unremarkable, no signs of pneumonia or CHF.  Initial serum labs are unremarkable.  We will add on LFTs due to the upper abdominal tenderness.  Obtain urinalysis to look for signs of UTI.  Trend troponin.  Patient  given antacids for symptom relief.  Advised to follow-up with primary care if the rest of the work-up is reassuring.  Return precautions discussed.       FINAL CLINICAL IMPRESSION(S) / ED DIAGNOSES   Final diagnoses:  Atypical chest pain     Rx / DC Orders   ED Discharge Orders     None        Note:  This document was prepared using Dragon voice recognition software and may include unintentional dictation errors.   Carrie Mew, MD 07/20/21 1524

## 2021-07-20 NOTE — ED Triage Notes (Signed)
Patient to ER from [facility]. Patient reports feeling poorly for the last three weeks. Reports "burning" across his chest, shortness of breath at times, productive cough, and dizziness when moving around. Also reports nausea and hot flashes over the last several days.   Reports hx of a heart condition.

## 2021-08-30 ENCOUNTER — Other Ambulatory Visit: Payer: Self-pay

## 2021-08-30 ENCOUNTER — Emergency Department: Payer: Medicare PPO

## 2021-08-30 ENCOUNTER — Emergency Department
Admission: EM | Admit: 2021-08-30 | Discharge: 2021-08-30 | Disposition: A | Discharge status: Home / Self Care | Payer: Medicare PPO | Attending: Emergency Medicine | Admitting: Emergency Medicine

## 2021-08-30 DIAGNOSIS — R0781 Pleurodynia: Secondary | ICD-10-CM | POA: Insufficient documentation

## 2021-08-30 DIAGNOSIS — R0602 Shortness of breath: Secondary | ICD-10-CM | POA: Diagnosis present

## 2021-08-30 DIAGNOSIS — R519 Headache, unspecified: Secondary | ICD-10-CM | POA: Insufficient documentation

## 2021-08-30 LAB — CBC WITH DIFFERENTIAL/PLATELET
Abs Immature Granulocytes: 0.02 10*3/uL (ref 0.00–0.07)
Basophils Absolute: 0 10*3/uL (ref 0.0–0.1)
Basophils Relative: 0 %
Eosinophils Absolute: 0.1 10*3/uL (ref 0.0–0.5)
Eosinophils Relative: 1 %
HCT: 47.6 % (ref 39.0–52.0)
Hemoglobin: 14.9 g/dL (ref 13.0–17.0)
Immature Granulocytes: 0 %
Lymphocytes Relative: 51 %
Lymphs Abs: 5.5 10*3/uL — ABNORMAL HIGH (ref 0.7–4.0)
MCH: 27.9 pg (ref 26.0–34.0)
MCHC: 31.3 g/dL (ref 30.0–36.0)
MCV: 89 fL (ref 80.0–100.0)
Monocytes Absolute: 0.8 10*3/uL (ref 0.1–1.0)
Monocytes Relative: 7 %
Neutro Abs: 4.5 10*3/uL (ref 1.7–7.7)
Neutrophils Relative %: 41 %
Platelets: 147 10*3/uL — ABNORMAL LOW (ref 150–400)
RBC: 5.35 MIL/uL (ref 4.22–5.81)
RDW: 15.2 % (ref 11.5–15.5)
Smear Review: NORMAL
WBC: 11 10*3/uL — ABNORMAL HIGH (ref 4.0–10.5)
nRBC: 0 % (ref 0.0–0.2)

## 2021-08-30 LAB — COMPREHENSIVE METABOLIC PANEL
ALT: 37 U/L (ref 0–44)
AST: 40 U/L (ref 15–41)
Albumin: 4.7 g/dL (ref 3.5–5.0)
Alkaline Phosphatase: 84 U/L (ref 38–126)
Anion gap: 8 (ref 5–15)
BUN: 19 mg/dL (ref 8–23)
CO2: 24 mmol/L (ref 22–32)
Calcium: 9.7 mg/dL (ref 8.9–10.3)
Chloride: 107 mmol/L (ref 98–111)
Creatinine, Ser: 0.98 mg/dL (ref 0.61–1.24)
GFR, Estimated: >60 mL/min (ref >60)
Glucose, Bld: 126 mg/dL — ABNORMAL HIGH (ref 70–99)
Potassium: 3.5 mmol/L (ref 3.5–5.1)
Sodium: 139 mmol/L (ref 135–145)
Total Bilirubin: 2.3 mg/dL — ABNORMAL HIGH (ref 0.3–1.2)
Total Protein: 7.9 g/dL (ref 6.5–8.1)

## 2021-08-30 LAB — TROPONIN I (HIGH SENSITIVITY)
Troponin I (High Sensitivity): 10 ng/L (ref <18)
Troponin I (High Sensitivity): 11 ng/L (ref <18)

## 2021-08-30 LAB — D-DIMER, QUANTITATIVE: D-Dimer, Quant: <0.27 ug/mL-FEU (ref 0.00–0.50)

## 2021-08-30 NOTE — ED Notes (Signed)
Dr. Kerman Passey saw EKG and states no STEMI noted

## 2021-08-30 NOTE — ED Triage Notes (Signed)
Pt presents to ED with c/o of SOB and states this has been ongoing for 2-3 months, pt has been seen but states it is not getting better. Pt states seen in this ED for same pt denies radiation of pain.   Pt states blurry vision that has also been ongoing for a few weeks. Pt states he wakes up with a  headache everyday. Pt states he does take a blood thinner.

## 2021-08-30 NOTE — Discharge Instructions (Signed)
Head CT does not show anything going on chest x-ray EKG looked good the blood clot screening test was negative and your heart blood test the troponin was -2.  I would continue following up with your healthcare providers.  I should add that the one blood test the CBC showed a pattern we often see with viral illnesses.  It could be that your having a mild virus that will go away in a few more days.  Until that happens please continue to follow-up with your doctors return here for any high fever or severe pain or shortness of breath.

## 2021-08-30 NOTE — ED Provider Notes (Signed)
[facility] Provider Note    Event Date/Time   First MD Initiated Contact with Patient 08/30/21 1324     (approximate)   History   Chest Pain and Shortness of Breath   HPI  Martin Adkins is a 76 y.o. male who complains of increasing shortness of breath and some chest pain and tightness that is worse with deep breathing.  This been going on for few weeks and really has not gone away.  Patient also complains of daily headache in the morning that then goes away later.  He has seen the eye doctor.  He has seen cardiology and neurology and is going to go see Dr. Lucky Cowboy vascular as he has had a AAA repair and some other vascular work.  Patient also complains of some blurry vision he has some glasses that he wears sometimes but he has some problems with glare so he does not wear them always.      Physical Exam   Triage Vital Signs: ED Triage Vitals [08/30/21 1053]  Enc Vitals Group     BP (!) 141/86     Pulse Rate 93     Resp 17     Temp 98.7 F (37.1 C)     Temp Source Oral     SpO2 94 %     Weight 216 lb (98 kg)     Height 6' (1.829 m)     Head Circumference      Peak Flow      Pain Score 5     Pain Loc      Pain Edu?      Excl. in [place]?     Most recent vital signs: Vitals:   08/30/21 1053 08/30/21 1400  BP: (!) 141/86 110/78  Pulse: 93 62  Resp: 17 17  Temp: 98.7 F (37.1 C)   SpO2: 94% 97%     General: Awake, no distress.  Head normocephalic atraumatic Eyes pupils equal round  CV:  Good peripheral perfusion.  Heart regular rate and rhythm no audible murmurs Resp:  Normal effort.  Lungs are clear Abd:  No distention.  Soft bowel sounds are positive there is no tenderness Extremities trace edema bilaterally   ED Results / Procedures / Treatments   Labs (all labs ordered are listed, but only abnormal results are displayed) Labs Reviewed  COMPREHENSIVE METABOLIC PANEL - Abnormal; Notable for the following components:      Result  Value   Glucose, Bld 126 (*)    Total Bilirubin 2.3 (*)    All other components within normal limits  CBC WITH DIFFERENTIAL/PLATELET - Abnormal; Notable for the following components:   WBC 11.0 (*)    Platelets 147 (*)    Lymphs Abs 5.5 (*)    All other components within normal limits  D-DIMER, QUANTITATIVE  TROPONIN I (HIGH SENSITIVITY)  TROPONIN I (HIGH SENSITIVITY)     EKG  EKG read and interpreted by me shows normal sinus rhythm rate of 93 normal axis Q's inferiorly but no acute changes no changes compared to prior EKG   RADIOLOGY Chest x-ray read by radiology reviewed and interpreted by me shows no acute changes   PROCEDURES:  Critical Care performed:   Procedures   MEDICATIONS ORDERED IN ED: Medications - No data to display   IMPRESSION / MDM / [place] / ED COURSE  I reviewed the triage vital signs and the nursing notes. ----------------------------------------- 2:54 PM on 08/30/2021 ----------------------------------------- EKG  chest x-ray are negative troponin is negative.  Patient's been having pain for several days unlikely to get a second 1.  His D-dimer is negative.  His head CT is also negative.  I will have him follow-up with the eye doctors and his other care providers to continue to evaluate him and he will return here if he is worse. Differential diagnosis includes, but is not limited to, no obvious tumor was seen on the head CT.  His morning headaches may be due to eye strain although usually that happens in the afternoon or possibly hypertension his blood pressure was high on arrival but its been fine here.  There is no sign of anything else going on currently.  His D-dimer is negative EKG looks stable troponin is negative there is no sign of any heart disease or blood clots.  Nothing to make me suspicious particularly of pleurisy.  There is no pain in his belly making me worried for hepatitis or cholecystitis with radiation.  In fact his liver  functions are normal his white count is minimally elevated but the differential looks more viral than anything else.  Patient's presentation is most consistent with acute complicated illness / injury requiring diagnostic workup.  The patient is on the cardiac monitor to evaluate for evidence of arrhythmia and/or significant heart rate changes.  None have been seen  FINAL CLINICAL IMPRESSION(S) / ED DIAGNOSES   Final diagnoses:  Nonintractable headache, unspecified chronicity pattern, unspecified headache type  Pleuritic chest pain     Rx / DC Orders   ED Discharge Orders     None        Note:  This document was prepared using Dragon voice recognition software and may include unintentional dictation errors.   Nena Polio, MD 08/30/21 [phone]

## 2021-08-31 ENCOUNTER — Inpatient Hospital Stay: Payer: Medicare PPO | Attending: Oncology

## 2021-08-31 DIAGNOSIS — C20 Malignant neoplasm of rectum: Secondary | ICD-10-CM | POA: Insufficient documentation

## 2021-08-31 DIAGNOSIS — C7A8 Other malignant neuroendocrine tumors: Secondary | ICD-10-CM

## 2021-08-31 LAB — CBC WITH DIFFERENTIAL/PLATELET
Abs Immature Granulocytes: 0.01 10*3/uL (ref 0.00–0.07)
Basophils Absolute: 0 10*3/uL (ref 0.0–0.1)
Basophils Relative: 0 %
Eosinophils Absolute: 0.1 10*3/uL (ref 0.0–0.5)
Eosinophils Relative: 1 %
HCT: 45.3 % (ref 39.0–52.0)
Hemoglobin: 14.7 g/dL (ref 13.0–17.0)
Immature Granulocytes: 0 %
Lymphocytes Relative: 56 %
Lymphs Abs: 5.1 10*3/uL — ABNORMAL HIGH (ref 0.7–4.0)
MCH: 28.6 pg (ref 26.0–34.0)
MCHC: 32.5 g/dL (ref 30.0–36.0)
MCV: 88.1 fL (ref 80.0–100.0)
Monocytes Absolute: 0.7 10*3/uL (ref 0.1–1.0)
Monocytes Relative: 8 %
Neutro Abs: 3.2 10*3/uL (ref 1.7–7.7)
Neutrophils Relative %: 35 %
Platelets: 143 10*3/uL — ABNORMAL LOW (ref 150–400)
RBC: 5.14 MIL/uL (ref 4.22–5.81)
RDW: 15.2 % (ref 11.5–15.5)
Smear Review: NORMAL
WBC: 9.2 10*3/uL (ref 4.0–10.5)
nRBC: 0 % (ref 0.0–0.2)

## 2021-08-31 LAB — COMPREHENSIVE METABOLIC PANEL
ALT: 34 U/L (ref 0–44)
AST: 38 U/L (ref 15–41)
Albumin: 4.5 g/dL (ref 3.5–5.0)
Alkaline Phosphatase: 79 U/L (ref 38–126)
Anion gap: 8 (ref 5–15)
BUN: 19 mg/dL (ref 8–23)
CO2: 23 mmol/L (ref 22–32)
Calcium: 9.2 mg/dL (ref 8.9–10.3)
Chloride: 108 mmol/L (ref 98–111)
Creatinine, Ser: 0.93 mg/dL (ref 0.61–1.24)
GFR, Estimated: >60 mL/min (ref >60)
Glucose, Bld: 113 mg/dL — ABNORMAL HIGH (ref 70–99)
Potassium: 3.6 mmol/L (ref 3.5–5.1)
Sodium: 139 mmol/L (ref 135–145)
Total Bilirubin: 1.9 mg/dL — ABNORMAL HIGH (ref 0.3–1.2)
Total Protein: 7.6 g/dL (ref 6.5–8.1)

## 2021-08-31 LAB — LACTATE DEHYDROGENASE: LDH: 142 U/L (ref 98–192)

## 2021-09-03 LAB — COMP PANEL: LEUKEMIA/LYMPHOMA: Immunophenotypic Profile: 37

## 2021-09-03 LAB — CHROMOGRANIN A: Chromogranin A (ng/mL): 194.6 ng/mL — ABNORMAL HIGH (ref 0.0–101.8)

## 2021-09-07 ENCOUNTER — Encounter: Payer: Self-pay | Admitting: Oncology

## 2021-09-07 ENCOUNTER — Inpatient Hospital Stay: Payer: Medicare PPO | Attending: Oncology | Admitting: Oncology

## 2021-09-07 VITALS — BP 124/73 | HR 70 | Temp 97.1°F | Resp 18 | Wt 217.8 lb

## 2021-09-07 DIAGNOSIS — Z79899 Other long term (current) drug therapy: Secondary | ICD-10-CM | POA: Insufficient documentation

## 2021-09-07 DIAGNOSIS — Z7982 Long term (current) use of aspirin: Secondary | ICD-10-CM | POA: Diagnosis not present

## 2021-09-07 DIAGNOSIS — R0602 Shortness of breath: Secondary | ICD-10-CM | POA: Diagnosis not present

## 2021-09-07 DIAGNOSIS — I700 Atherosclerosis of aorta: Secondary | ICD-10-CM | POA: Diagnosis not present

## 2021-09-07 DIAGNOSIS — I714 Abdominal aortic aneurysm, without rupture, unspecified: Secondary | ICD-10-CM | POA: Insufficient documentation

## 2021-09-07 DIAGNOSIS — Z85048 Personal history of other malignant neoplasm of rectum, rectosigmoid junction, and anus: Secondary | ICD-10-CM | POA: Diagnosis present

## 2021-09-07 DIAGNOSIS — Z86010 Personal history of colonic polyps: Secondary | ICD-10-CM | POA: Diagnosis not present

## 2021-09-07 DIAGNOSIS — Z7901 Long term (current) use of anticoagulants: Secondary | ICD-10-CM | POA: Diagnosis not present

## 2021-09-07 DIAGNOSIS — E785 Hyperlipidemia, unspecified: Secondary | ICD-10-CM | POA: Diagnosis not present

## 2021-09-07 DIAGNOSIS — C9110 Chronic lymphocytic leukemia of B-cell type not having achieved remission: Secondary | ICD-10-CM

## 2021-09-07 DIAGNOSIS — I5022 Chronic systolic (congestive) heart failure: Secondary | ICD-10-CM | POA: Diagnosis not present

## 2021-09-07 DIAGNOSIS — I4891 Unspecified atrial fibrillation: Secondary | ICD-10-CM | POA: Insufficient documentation

## 2021-09-07 DIAGNOSIS — I252 Old myocardial infarction: Secondary | ICD-10-CM | POA: Diagnosis not present

## 2021-09-07 DIAGNOSIS — C7A8 Other malignant neuroendocrine tumors: Secondary | ICD-10-CM | POA: Diagnosis not present

## 2021-09-07 DIAGNOSIS — Z9049 Acquired absence of other specified parts of digestive tract: Secondary | ICD-10-CM | POA: Insufficient documentation

## 2021-09-07 DIAGNOSIS — K2270 Barrett's esophagus without dysplasia: Secondary | ICD-10-CM | POA: Insufficient documentation

## 2021-09-07 DIAGNOSIS — C9111 Chronic lymphocytic leukemia of B-cell type in remission: Secondary | ICD-10-CM | POA: Insufficient documentation

## 2021-09-07 DIAGNOSIS — R161 Splenomegaly, not elsewhere classified: Secondary | ICD-10-CM | POA: Insufficient documentation

## 2021-09-07 DIAGNOSIS — I2510 Atherosclerotic heart disease of native coronary artery without angina pectoris: Secondary | ICD-10-CM | POA: Diagnosis not present

## 2021-09-07 DIAGNOSIS — I739 Peripheral vascular disease, unspecified: Secondary | ICD-10-CM | POA: Insufficient documentation

## 2021-09-07 DIAGNOSIS — K219 Gastro-esophageal reflux disease without esophagitis: Secondary | ICD-10-CM | POA: Diagnosis not present

## 2021-09-07 DIAGNOSIS — E538 Deficiency of other specified B group vitamins: Secondary | ICD-10-CM | POA: Insufficient documentation

## 2021-09-07 DIAGNOSIS — I110 Hypertensive heart disease with heart failure: Secondary | ICD-10-CM | POA: Insufficient documentation

## 2021-09-07 DIAGNOSIS — M069 Rheumatoid arthritis, unspecified: Secondary | ICD-10-CM | POA: Diagnosis not present

## 2021-09-07 DIAGNOSIS — G4733 Obstructive sleep apnea (adult) (pediatric): Secondary | ICD-10-CM | POA: Diagnosis not present

## 2021-09-07 DIAGNOSIS — N400 Enlarged prostate without lower urinary tract symptoms: Secondary | ICD-10-CM | POA: Diagnosis not present

## 2021-09-07 NOTE — Progress Notes (Signed)
Hematology/Oncology Progress note [phone] [phone]      Patient Care Team: [name], MD as PCP - General (Internal Medicine) Earlie Server, MD as Medical Oncologist (Medical Oncology)  REFERRING PROVIDER: [name], MD  REASON FOR VISIT Follow up for management of CLL, Rectal NET HISTORY OF PRESENTING ILLNESS:  Martin Adkins is a  76 y.o.  male with PMH listed below who was referred to me for evaluation of lymphocytosis.  Recent lab work on 03/05/2017 showed wbc 11.4, hb 14.6, platelet count 117,000, lymphocytosis 63.5%, neutrophil 22%, Report chronic fatigue, no weight loss, fever or chills.  He reports feeling chest "rattleness". Has for CT chest which showed acute finding, chronic finding includes  postsurgical changes consistent with coronary bypass grafting. One of the bypass grafts arising from the aorta shows apparent thrombosis and an area of distal aneurysmal dilatation which is also Thrombosed. Right adrenal adenoma stable in appearance. Mild scarring in the lung bases.Fatty liver.   Takes Aspirin $RemoveBefor'81mg'[id number]$ , coumadine, plavix. He is on anticoagulation for previous history of clots.  Denies any bleeding events. Use to drink hard liquir daily, quitted for 4-5 years. Current drinks wine on weekend.    Image Studies # 04/04/2017  US abdomen showed 1.  Splenomegaly.  No focal splenic lesions evident. 2. Increased liver echogenicity, a finding most likely indicative of hepatic steatosis. While no focal liver lesions are evident on this study, it must be cautioned that the sensitivity of ultrasound for detection of focal liver lesions is diminished in this circumstance.3. Pancreas obscured by gas. Portions of inferior vena cava obscured by gas.4.  Gallbladder absent.5. Status post abdominal aortic aneurysm repair. No periaortic fluid.  # 04/21/2017 PET scan 1. Moderate splenomegaly with uniform splenic hypermetabolism,compatible with the provided  history of lymphoproliferative disease.No additional hypermetabolic sites of lymphoproliferative disease. Specifically, no hypermetabolic lymphadenopathy Hepatic steatosis, aortic atherosclerosis. Aneurysm.    # CLL, stage IV disease given spelencemagly, thrombocytopenia, symptomatic with fatigue and weight loss.  CLL IPL score 4, high risk.   # status post abdominal aortic aneurysm repair on 09/25/2017 s/p cardiac cath which revealed patent LIMA to the LAD, no significant disease in the RCA with a 90% stenosis in a small RV marginal branch.  Occluded left circumflex, occluded LAD.  Saphenous vein grafts are all occluded.  RCA does not require grafting.  LAD is well grafted by the left internal mammary.  Medical management  07/24/2020, colonoscopy showed diverticulosis in the sigmoid colon.  Multiple polyps were removed from descending colon, proximal ascending colon, transverse colon, sigmoid colon and 5 small 2-53mm polyps resected from rectum. Polyps from the colon are tubular adenoma, negative for high-grade dysplasia and malignancy. Polyps from rectum showed well differentiated neuroendocrine tumor, grade 1, involving the base of the submitted tissue-1 fragment, hyperplastic polyp 2 fragments.  10/09/2020 EUS by Dr.Mansouraty  FLEX Impression: Stool in the sigmoid colon and in the descending colon. - Preparation of the colon was fair even after significant lavage - however large stool began to come through the rest of the colon as procedure continued. - >30 1 to 5 mm polyps in the rectum, at the recto-sigmoid colon and in the sigmoid colon. 16 of the polyps which were in the rectum were removed with a cold snare. Resected and retrieved.- Non-bleeding non-thrombosed external and internal hemorrhoids  EUS Impression: - Endosonographic imaging showed no sign of significant pathology at the rectosigmoid junction. - Endosonographic images of the rectum were unremarkable. - Endosonographic images  of  the perirectal space were unremarkable. - No malignant-appearing lymph nodes were visualized endosonographically in the perirectal region and in the left iliac region. - The internal anal sphincter was visualized endosonographically and appeared normal  Dr.Mansouraty recommend repeat 1 year Flex sigmoidoscopy with EUS  INTERVAL HISTORY Martin Adkins is a 76 y.o. male who has above presents for follow-up of CLL, rectal neuroendocrine tumor. He has been off venetoclax since 11/11/2017. Recent ER visit due to shortness of breath.  Patient was recently seen by pulmonology and cardiology. There is plan of Lexiscan for further evaluation of myocardial ischemia, arrhythmia contributing to his symptoms. Patient denies unintentional weight loss, night sweats, fever.  Appetite is good. No new complaints. He takes protonix long term.  He has held about 2 weeks off Protonix prior to getting blood work done.  Review of Systems  Constitutional:  Negative for appetite change, chills, fatigue, fever and unexpected weight change.  HENT:   Negative for hearing loss and voice change.   Eyes:  Negative for eye problems and icterus.  Respiratory:  Positive for shortness of breath. Negative for chest tightness, cough and wheezing.   Cardiovascular:  Negative for chest pain and leg swelling.  Gastrointestinal:  Negative for abdominal distention and abdominal pain.  Endocrine: Negative for hot flashes.  Genitourinary:  Negative for difficulty urinating, dysuria and frequency.   Musculoskeletal:  Negative for arthralgias and back pain.  Skin:  Negative for itching and rash.  Neurological:  Positive for numbness. Negative for dizziness, headaches and light-headedness.  Hematological:  Negative for adenopathy. Does not bruise/bleed easily.  Psychiatric/Behavioral:  Negative for confusion.      MEDICAL HISTORY:  Past Medical History:  Diagnosis Date   AAA (abdominal aortic aneurysm) without rupture ([place])     a.) [place] 02/09/04 - 3.1 x 3.4 cm. b.) [place] 02/14/05 - 3.62 x 3.63 cm. c.) [place] 02/25/06 - 3.97 x 4.0 cm. d.) CT 07/09/11 - 4.9 cm. e.) s/p EVAR 2013. f.) enlarging AAA --> back to the OR on 09/24/2017 for placement of a 27 mm x 12 cm RIGHT iliac extension limb down to distal CIA.   Aortic atherosclerosis (HCC)    APS (antiphospholipid syndrome) (HCC)    Arthritis    Atrial fibrillation (HCC)    a.) CHA2DS2-VASc Score = 6 (age, CHF, HTN, prior DVT x 2, prior MI). b.) on rivaroxaban   B12 deficiency 03/21/2017   Barrett's esophagus    BPH (benign prostatic hyperplasia)    CAD (coronary artery disease)    a.) LHC 11/14/95 --> 95% mLCx, 95% OM1; PTCA. b.) 4v CABG 2004 --> LIMA-LAD, SVG-D1,RCA,OM. c.) NSTEMI 05/02/2013 --> PCI with DES x 3 to SVG-OM in 2 distinct areas with filter. d.) Inf STEMI 07/30/14 - LHC --> 50% LM, 90% mLCx, 100% OM2, 100% RPDA; LIMA-LAD patent; 100% occ of SVG OM2,OM3,PDA; med mgmt. e.) LHC 01/19/18 - patent LIMA-LAD; other grafts occ; not amenable to PCI/further bypass.   Cardiomyopathy ([place])    Chronic anticoagulation    a.) Rivaroxaban   CLL (chronic lymphocytic leukemia) ([place]) 05/24/2017   DVT (deep venous thrombosis) (HCC)    GERD (gastroesophageal reflux disease)    HFrEF (heart failure with reduced ejection fraction) ([place])    a.) TTE 02/22/2020 --> mod. LV systolic dysfunction; LVEF 35-40%.   History of hiatal hernia    History of shingles 2013   Hyperlipemia    Hyperplastic polyps of stomach    a.) Bx on 07/24/2020 --> well differentiated  NET; WHO grade I.   Hypertension    NSTEMI (non-ST elevated myocardial infarction) ([place]) 05/02/2013   a.) LHC 05/03/2013 --> 50% LM, 100% LAD, 75% mLCx, 100% dLCx, 60% mRCA; patient LIMA-LAD, occluded SVG-D1 and SVG-PDA. SVG-OM with extensive thrombus and distal 99% lesion. HIGH RISK PCI performed at [facility] with placement of DES x 3 to 2 distinct areas of SVG with filter protection.   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 No intervention; medical management.   Valvular regurgitation    a.) TTE 02/22/2020 --> LVEF 35-40%; mild panvalvular.    SURGICAL HISTORY: Past Surgical History:  Procedure Laterality Date   ABDOMINAL AORTA STENT     CHOLECYSTECTOMY     COLONOSCOPY  11/24/2009   COLONOSCOPY N/A 10/09/2020   Procedure: COLONOSCOPY;  Surgeon: Mansouraty, Telford Nab., MD;  Location: [facility];  Service: Gastroenterology;  Laterality: N/A;   COLONOSCOPY WITH PROPOFOL N/A 07/24/2020   Procedure: COLONOSCOPY WITH PROPOFOL;  Surgeon: Toledo, Benay Pike, MD;  Location: [facility];  Service: Gastroenterology;  Laterality: N/A;   CORONARY ANGIOPLASTY WITH STENT PLACEMENT Left 05/03/2013   Procedure: CORONARY ANGIOPLASTY WITH STENT PLACEMENT (DES x 3 to SVG-OM graft); Location: [facility]   CORONARY ARTERY BYPASS GRAFT N/A 01/21/2002   Procedure: 4V CABG (LIMA-LAD, SVG-D1, SVG-RCA, SVG-OM); Location: [facility]   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   Procedure: DUPUYTREN CONTRACTURE RELEASE LEFT LONG FINGER;  Surgeon: Corky Mull, MD;  Location: [facility];  Service: Orthopedics;  Laterality: Left;   DUPUYTREN CONTRACTURE RELEASE Right 10/18/2020   Procedure: DUPUYTREN CONTRACTURE RELEASE;  Surgeon: Corky Mull, MD;  Location: [facility];  Service: Orthopedics;  Laterality: Right;   ENDOVASCULAR REPAIR/STENT GRAFT N/A 09/24/2017    Procedure: ENDOVASCULAR REPAIR/STENT GRAFT (27 mm x 12 cm RIGHT iliac limb extension to distal CIA);  Surgeon: Algernon Huxley, MD;  Location: [place] [facility];  Service: Cardiovascular;  Laterality: N/A;   ESOPHAGOGASTRODUODENOSCOPY  05/26/02  03/23/12   ESOPHAGOGASTRODUODENOSCOPY (EGD) WITH PROPOFOL N/A 10/28/2016   Procedure: ESOPHAGOGASTRODUODENOSCOPY (EGD) WITH PROPOFOL;  Surgeon: Manya Silvas, MD;  Location[facility];  Service: Endoscopy;  Laterality: N/A;   EUS N/A 10/09/2020   Procedure: LOWER ENDOSCOPIC ULTRASOUND (EUS);  Surgeon: Irving Copas., MD;  Location: [facility];  Service: Gastroenterology;  Laterality: N/A;   INCISION AND DRAINAGE ABSCESS N/A 06/14/2020   Procedure: INCISION AND DRAINAGE ABSCESS;  Surgeon: Herbert Pun, MD;  Location: [facility];  Service: General;  Laterality: N/A;   INGUINAL HERNIA REPAIR Left    LEFT HEART CATH AND CORONARY ANGIOGRAPHY N/A 01/19/2018   Procedure: LEFT HEART CATH AND CORONARY ANGIOGRAPHY;  Surgeon: Teodoro Spray, MD;  Location: [place] [facility];  Service: Cardiovascular;  Laterality: N/A;   LEFT HEART CATH AND CORONARY ANGIOGRAPHY Left 11/14/1995   Procedure:  CARDIAC CATHETERIZATION (PTCA); Location: [facility]; Surgeon: Doristine Bosworth, MD   LEFT HEART CATH AND CORS/GRAFTS ANGIOGRAPHY Left 07/30/2014   Procedure: CARDIAC CATHETERIZATION; Location: [facility]   PERIPHERAL VASCULAR CATHETERIZATION N/A 09/06/2014   Procedure: IVC Filter Removal;  Surgeon: Katha Cabal, MD;  Location: [place] [facility];  Service: Cardiovascular;  Laterality: N/A;   POLYPECTOMY  10/09/2020   Procedure: POLYPECTOMY;  Surgeon: Rush Landmark Telford Nab., MD;  Location: [facility];  Service: Gastroenterology;;   SUBMUCOSAL LIFTING INJECTION  10/09/2020   Procedure: SUBMUCOSAL LIFTING INJECTION;  Surgeon: Irving Copas., MD;  Location: [facility];  Service: Gastroenterology;;   TRANSURETHRAL RESECTION OF PROSTATE N/A  02/20/2015   Procedure: TRANSURETHRAL RESECTION OF THE PROSTATE (TURP);  Surgeon: Hollice Espy, MD;  Location: [facility];  Service: Urology;  Laterality: N/A;    SOCIAL HISTORY: Social History   Socioeconomic History   Marital status: Legally Separated    Spouse name: Not on file   Number of children: Not on file   Years of education: Not on file   Highest education level: Not on file  Occupational History   Not on file  Tobacco Use   Smoking status: Some Days    Packs/day: 0.01    Years: 20.00    Total pack years: 0.20    Types: Cigarettes   Smokeless tobacco: Never   Tobacco comments:    sometimes 2-3 a day  Vaping Use   Vaping Use: Never used  Substance and Sexual Activity   Alcohol use: Yes    Alcohol/week: 1.0 - 2.0 standard drink of alcohol    Types: 1 - 2 Shots of liquor per week    Comment: casual   Drug use: No   Sexual activity: Never  Other Topics Concern   Not on file  Social History Narrative   Not on file   Social Determinants of Health   Financial Resource Strain: Low Risk  (09/24/2017)   Overall Financial Resource Strain (CARDIA)    Difficulty of Paying Living Expenses: Not hard at all  Food Insecurity: No Food Insecurity (09/24/2017)   Hunger Vital Sign    Worried About Running Out of Food in the Last Year: Never true    Ran Out of Food in the Last Year: Never true  Transportation Needs: No Transportation Needs (09/24/2017)   PRAPARE - Hydrologist (Medical): No    Lack of Transportation (Non-Medical): No  Physical Activity: Sufficiently Active (09/24/2017)   Exercise Vital Sign    Days of Exercise per Week: 7 days    Minutes of Exercise per Session: 40 min  Stress: No Stress Concern Present (09/24/2017)   [place]    Feeling of Stress : Not at all  Social Connections: Somewhat Isolated (09/24/2017)   Social Connection and Isolation Panel [NHANES]     Frequency of Communication with Friends and Family: More than three times a week    Frequency of Social Gatherings with Friends and Family: More than three times a week    Attends Religious Services: More than 4 times per year    Active Member of [employer] or Organizations: No    Attends Archivist Meetings: Never    Marital Status: Separated  Intimate Partner Violence: Not At Risk (09/24/2017)   Humiliation, Afraid, Rape, and Kick questionnaire    Fear of Current or Ex-Partner: No    Emotionally Abused: No    Physically Abused: No  Sexually Abused: No    FAMILY HISTORY: Family History  Problem Relation Age of Onset   Cancer Mother 97       BREAST   Diabetes Mother    Coronary artery disease Father    Heart attack Father    Prostate cancer Brother    Bladder Cancer Neg Hx    Kidney cancer Neg Hx     ALLERGIES:  is allergic to ace inhibitors, amoxicillin, niacin, penicillins, pravastatin, rosuvastatin, and simvastatin.  MEDICATIONS:  Current Outpatient Medications  Medication Sig Dispense Refill   amLODipine (NORVASC) 10 MG tablet Take 10 mg by mouth daily.      atorvastatin (LIPITOR) 80 MG tablet Take 80 mg by mouth daily.   11   Cholecalciferol (VITAMIN D3) 50 MCG (2000 UT) CAPS Take 2,000 Units/day by mouth daily.     dorzolamide (TRUSOPT) 2 % ophthalmic solution Place 1 drop into both eyes 2 (two) times daily.      fluticasone (FLONASE) 50 MCG/ACT nasal spray Place 2 sprays into both nostrils daily.      gabapentin (NEURONTIN) 300 MG capsule Take 1 capsule (300 mg total) by mouth 3 (three) times daily. (Patient taking differently: Take 300 mg by mouth 2 (two) times daily.) 90 capsule 0   HYDROcodone-acetaminophen (NORCO) 5-325 MG tablet Take 1-2 tablets by mouth every 6 (six) hours as needed for moderate pain or severe pain. MAXIMUM TOTAL ACETAMINOPHEN DOSE IS 4000 MG PER DAY 20 tablet 0   isosorbide mononitrate (IMDUR) 60 MG 24 hr tablet Take 1 tablet (60 mg total)  by mouth daily. 30 tablet 0   latanoprost (XALATAN) 0.005 % ophthalmic solution Place 1 drop into both eyes at bedtime.      losartan (COZAAR) 25 MG tablet Take 25 mg by mouth daily.     metoprolol succinate (TOPROL-XL) 100 MG 24 hr tablet Take 100 mg by mouth daily.      Multiple Vitamins-Minerals (MULTIVITAMIN MEN 50+ PO) Take 1 tablet by mouth daily.     pantoprazole (PROTONIX) 40 MG tablet Take 40 mg by mouth daily.      potassium chloride SA (K-DUR,KLOR-CON) 20 MEQ tablet Take 20 mEq by mouth daily.      rivaroxaban (XARELTO) 20 MG TABS tablet Take 1 tablet (20 mg total) by mouth daily with supper. 30 tablet 3   albuterol (VENTOLIN HFA) 108 (90 Base) MCG/ACT inhaler SMARTSIG:2 inhalation Via Inhaler Every 6 Hours PRN     No current facility-administered medications for this visit.     PHYSICAL EXAMINATION: ECOG 1 Vitals:   09/07/21 1011  BP: 124/73  Pulse: 70  Resp: 18  Temp: (!) 97.1 F (36.2 C)  SpO2: 99%  Physical Exam Vitals and nursing note reviewed.  Constitutional:      General: He is not in acute distress. HENT:     Head: Normocephalic and atraumatic.     Mouth/Throat:     Pharynx: No oropharyngeal exudate.  Eyes:     General: No scleral icterus. Neck:     Vascular: No JVD.  Cardiovascular:     Rate and Rhythm: Normal rate.     Heart sounds: Normal heart sounds. No murmur heard. Pulmonary:     Effort: Pulmonary effort is normal. No respiratory distress.     Breath sounds: No wheezing or rales.  Chest:     Chest wall: No tenderness.  Abdominal:     General: There is no distension.     Palpations: Abdomen is soft.  Tenderness: There is no abdominal tenderness.  Musculoskeletal:        General: No deformity. Normal range of motion.     Cervical back: Normal range of motion and neck supple.  Lymphadenopathy:     Cervical: No cervical adenopathy.  Skin:    General: Skin is warm and dry.     Findings: No erythema.  Neurological:     Mental Status: He  is alert and oriented to person, place, and time. Mental status is at baseline.     Cranial Nerves: No cranial nerve deficit.     Motor: No abnormal muscle tone.  Psychiatric:        Mood and Affect: Mood and affect normal.      LABORATORY DATA:  I have reviewed the data as listed Lab Results  Component Value Date   WBC 9.2 08/31/2021   HGB 14.7 08/31/2021   HCT 45.3 08/31/2021   MCV 88.1 08/31/2021   PLT 143 (L) 08/31/2021   Recent Labs    07/20/21 1118 07/20/21 1602 08/30/21 1056 08/31/21 1146  NA 142  --  139 139  K 3.6  --  3.5 3.6  CL 107  --  107 108  CO2 26  --  24 23  GLUCOSE 124*  --  126* 113*  BUN 15  --  19 19  CREATININE 1.03  --  0.98 0.93  CALCIUM 9.1  --  9.7 9.2  GFRNONAA >60  --  >60 >60  PROT  --  7.5 7.9 7.6  ALBUMIN  --  4.6 4.7 4.5  AST  --  33 40 38  ALT  --  33 37 34  ALKPHOS  --  91 84 79  BILITOT  --  1.7* 2.3* 1.9*  BILIDIR  --  0.3*  --   --   IBILI  --  1.4*  --   --     Hepatitis panel negative.  LDH 228, mildly elevated Beta 2 microglobulin normal.   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 The morphologic and immunophenotypic features are most consistent with chronic lymphocytic leukemia/small lymphocytic lymphoma. Bone marrow Cytogenetics : normal.  Peripheral blood FISH panel negative for CCND1- IGH mutation, ATM, 12, 13q, Tp53 mutation.   RADIOGRAPHIC STUDIES: I have personally reviewed the radiological images as listed and agreed with the findings in the report. CT angio abd/pelvis showed Type 1b endoleak, AAA aneurysm, Spleen has decreased in size since PET scan in April 2019.   ASSESSMENT & PLAN:   1. Primary neuroendocrine carcinoma of rectum ([place])   2. CLL (chronic lymphocytic leukemia) ([place])   3. Long term current use of anticoagulant therapy    Cancer Staging  Primary neuroendocrine carcinoma of rectum ([place]) Staging form: Colon and Rectum, AJCC 8th Edition -  Clinical: Stage I (cT1, cN0, cM0) - Signed by Earlie Server, MD on 08/24/2020  #CLL, stable.  Patient has been in remission.  Off treatment since November 2019. Lymphocytosis 4.7, 08/31/2021 peripheral blood flowcytometry showed CD5+, FMC7 plus, CD23- clonal B cells, <5000/ul. 37% of the leukocytes Early recurrence. He is asymptomatic without constitutional symptoms.. Continue watchful observation.   #Atrial fibrillation, on chronic anticoagulation with Xarelto.  Follow-up with cardiology. #Shortness of breath, follow-up with cardiology for further evaluation.  #Stage I T1N0 rectum neuroendocrine tumor MRI pelvis without contrast showed no evidence of rectal primary or polyps identified.  I clarified with radiology Dr. Jobe Igo.  An addendum was  added that the T staging [ post resection] should be T0, no muscularis propia invasion.  10/2020 Flex sigmoidoscopy by Dr.Mansouraty did not reveal significant pathology at the rectosigmoid junction.  He will repeat Flex sigmoidoscopy in Oct 2023.  Check CT abdomen pelvis with contrast annually.  Patient's chromogranin A level is elevated, this could be secondary to PPI use however he has recently held prior to the lab work.  Pending CT work-up.  Repeat   6 months, lab cbc cmp flowcytometry, LDH, chromogranin A  Earlie Server, MD, PhD Hematology Oncology  09/07/2021

## 2021-09-07 NOTE — Progress Notes (Signed)
Pt here for follow up. Pt reports he went to ER last week due to shortness of breath and chest tightness. Still reports mild shortness of breath

## 2021-09-11 ENCOUNTER — Ambulatory Visit
Admission: RE | Admit: 2021-09-11 | Discharge: 2021-09-11 | Disposition: A | Discharge status: Home / Self Care | Payer: Medicare PPO | Source: Ambulatory Visit | Attending: Oncology | Admitting: Oncology

## 2021-09-11 DIAGNOSIS — C7A8 Other malignant neuroendocrine tumors: Secondary | ICD-10-CM | POA: Diagnosis not present

## 2021-09-11 MED ORDER — IOHEXOL 300 MG/ML  SOLN
100.0000 mL | Freq: Once | INTRAMUSCULAR | Status: AC | PRN
  Administered 2021-09-11: 100 mL via INTRAVENOUS

## 2021-09-19 ENCOUNTER — Telehealth: Payer: Self-pay

## 2021-09-19 ENCOUNTER — Other Ambulatory Visit: Payer: Self-pay

## 2021-09-19 DIAGNOSIS — D128 Benign neoplasm of rectum: Secondary | ICD-10-CM

## 2021-09-19 NOTE — Telephone Encounter (Signed)
[place] EUS has been scheduled for 10/30 at 1030 am at [facility] with GM   Left message on machine to call back

## 2021-09-19 NOTE — Telephone Encounter (Signed)
Anti coag letter has been sent  Instructions mailed   Left message on machine to call back

## 2021-09-20 NOTE — Telephone Encounter (Signed)
EUS scheduled, pt instructed and medications reviewed.  Patient instructions mailed to home and sent to My Chart.  Patient to call with any questions or concerns.   The pt will await a call regarding anti coag response

## 2021-09-20 NOTE — Telephone Encounter (Signed)
Left message on machine to call back  

## 2021-09-25 ENCOUNTER — Encounter (INDEPENDENT_AMBULATORY_CARE_PROVIDER_SITE_OTHER): Payer: Self-pay | Admitting: Nurse Practitioner

## 2021-09-25 ENCOUNTER — Ambulatory Visit (INDEPENDENT_AMBULATORY_CARE_PROVIDER_SITE_OTHER): Payer: Medicare PPO | Admitting: Nurse Practitioner

## 2021-09-25 ENCOUNTER — Ambulatory Visit (INDEPENDENT_AMBULATORY_CARE_PROVIDER_SITE_OTHER): Payer: Medicare PPO

## 2021-09-25 VITALS — BP 126/75 | HR 63 | Resp 17 | Ht 72.0 in | Wt 215.0 lb

## 2021-09-25 DIAGNOSIS — I714 Abdominal aortic aneurysm, without rupture, unspecified: Secondary | ICD-10-CM | POA: Diagnosis not present

## 2021-09-25 DIAGNOSIS — I6529 Occlusion and stenosis of unspecified carotid artery: Secondary | ICD-10-CM

## 2021-09-25 DIAGNOSIS — I739 Peripheral vascular disease, unspecified: Secondary | ICD-10-CM

## 2021-09-25 DIAGNOSIS — I7143 Infrarenal abdominal aortic aneurysm, without rupture: Secondary | ICD-10-CM | POA: Diagnosis not present

## 2021-09-25 DIAGNOSIS — E1151 Type 2 diabetes mellitus with diabetic peripheral angiopathy without gangrene: Secondary | ICD-10-CM

## 2021-09-25 NOTE — Progress Notes (Signed)
Subjective:    Patient ID: Martin Adkins, male    DOB: 02-17-1945, 76 y.o.   MRN: [id number] No chief complaint on file.   Patient returns today in follow up of multiple vascular issues.  He is several years status post endovascular abdominal aortic aneurysm repair and also has a known history of peripheral arterial disease.  He does get some mild claudication symptoms but based on his description this is more so related to neurogenic claudication.  No open sores.  No rest pain.  He also denies focal neurologic symptoms. Specifically, the patient denies amaurosis fugax, speech or swallowing difficulties, or arm or leg weakness or numbness.    Non-invasive studies today demonstrate stable 5.4 cm diameter aortic sac size without evidence of endoleak.  ABIs today are noncompressible although the waveforms are quite good overall.  The digit pressure is 122 on the right and 80 on the left   Review of Systems  Musculoskeletal:  Positive for back pain.  All other systems reviewed and are negative.      Objective:   Physical Exam Vitals reviewed.  HENT:     Head: Normocephalic.  Cardiovascular:     Rate and Rhythm: Normal rate.     Pulses:          Dorsalis pedis pulses are detected w/ Doppler on the right side and detected w/ Doppler on the left side.       Posterior tibial pulses are detected w/ Doppler on the right side and detected w/ Doppler on the left side.  Pulmonary:     Effort: Pulmonary effort is normal.  Skin:    General: Skin is warm and dry.  Neurological:     Mental Status: He is alert and oriented to person, place, and time.  Psychiatric:        Mood and Affect: Mood normal.        Behavior: Behavior normal.        Thought Content: Thought content normal.        Judgment: Judgment normal.    BP 126/75 (BP Location: Left Arm)   Pulse 63   Resp 17   Ht 6' (1.829 m)   Wt 215 lb (97.5 kg)   BMI 29.16 kg/m   Past Medical History:  Diagnosis Date  . AAA  (abdominal aortic aneurysm) without rupture ([place])    a.) [place] 02/09/04 - 3.1 x 3.4 cm. b.) [place] 02/14/05 - 3.62 x 3.63 cm. c.) [place] 02/25/06 - 3.97 x 4.0 cm. d.) CT 07/09/11 - 4.9 cm. e.) s/p EVAR 2013. f.) enlarging AAA --> back to the OR on 09/24/2017 for placement of a 27 mm x 12 cm RIGHT iliac extension limb down to distal CIA.  Marland Kitchen Aortic atherosclerosis ([place])   . APS (antiphospholipid syndrome) ([place])   . Arthritis   . Atrial fibrillation ([place])    a.) CHA2DS2-VASc Score = 6 (age, CHF, HTN, prior DVT x 2, prior MI). b.) on rivaroxaban  . B12 deficiency 03/21/2017  . Barrett's esophagus   . BPH (benign prostatic hyperplasia)   . CAD (coronary artery disease)    a.) LHC 11/14/95 --> 95% mLCx, 95% OM1; PTCA. b.) 4v CABG 2004 --> LIMA-LAD, SVG-D1,RCA,OM. c.) NSTEMI 05/02/2013 --> PCI with DES x 3 to SVG-OM in 2 distinct areas with filter. d.) Inf STEMI 07/30/14 - LHC --> 50% LM, 90% mLCx, 100% OM2, 100% RPDA; LIMA-LAD patent; 100% occ of SVG OM2,OM3,PDA; med mgmt. e.) LHC 01/19/18 - patent  LIMA-LAD; other grafts occ; not amenable to PCI/further bypass.  . Cardiomyopathy ([place])   . Chronic anticoagulation    a.) Rivaroxaban  . CLL (chronic lymphocytic leukemia) ([place]) 05/24/2017  . DVT (deep venous thrombosis) ([place])   . GERD (gastroesophageal reflux disease)   . HFrEF (heart failure with reduced ejection fraction) ([place])    a.) TTE 02/22/2020 --> mod. LV systolic dysfunction; LVEF 35-40%.  . History of hiatal hernia   . History of shingles 2013  . Hyperlipemia   . Hyperplastic polyps of stomach    a.) Bx on 07/24/2020 --> well differentiated NET; WHO grade I.  . Hypertension   . NSTEMI (non-ST elevated myocardial infarction) ([place]) 05/02/2013   a.) LHC 05/03/2013 --> 50% LM, 100% LAD, 75% mLCx, 100% dLCx, 60% mRCA; patient LIMA-LAD, occluded SVG-D1 and SVG-PDA. SVG-OM with extensive thrombus and distal 99% lesion. HIGH RISK PCI performed at [facility] with placement of DES x 3 to 2 distinct areas of SVG with  filter protection.  . OSA (obstructive sleep apnea)    a.) does NOT use nocturnal PAP therapy  . Osteoarthritis   . Pneumonia   . Pre-diabetes   . 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  b.) Stage I (cT1, cN0, cM0)  . PVD (peripheral vascular disease) ([place])   . RA (rheumatoid arthritis) ([place])   . 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 SOB (shortness of breath)   . ST elevation myocardial infarction (STEMI) of inferior wall ([place]) 07/30/2014   a.) LHC --> 50% LM, 90% mLCx, 100% OM2, 100% RPDA; LIMA-LAD patent; 100% occlusions of SVG OM2, OM3, PDA. No intervention; medical management.  . Valvular regurgitation    a.) TTE 02/22/2020 --> LVEF 35-40%; mild panvalvular.    Social History   Socioeconomic History  . Marital status: Legally Separated    Spouse name: Not on file  . Number of children: Not on file  . Years of education: Not on file  . Highest education level: Not on file  Occupational History  . Not on file  Tobacco Use  . Smoking status: Some Days    Packs/day: 0.01    Years: 20.00    Total pack years: 0.20    Types: Cigarettes  . Smokeless tobacco: Never  . Tobacco comments:    sometimes 2-3 a day  Vaping Use  . Vaping Use: Never used  Substance and Sexual Activity  . Alcohol use: Yes    Alcohol/week: 1.0 - 2.0 standard drink of alcohol    Types: 1 - 2 Shots of liquor per week    Comment: casual  . Drug use: No  . Sexual activity: Never  Other Topics Concern  . Not on file  Social History Narrative  . Not on file   Social Determinants of Health   Financial Resource Strain: Low Risk  (09/24/2017)   Overall Financial Resource Strain (CARDIA)   . Difficulty of Paying Living Expenses: Not hard at all  Food Insecurity: No Food Insecurity (09/24/2017)   Hunger Vital Sign   . Worried About Charity fundraiser in the Last Year: Never true   . Ran Out of  Food in the Last Year: Never true  Transportation Needs: No Transportation Needs (09/24/2017)   PRAPARE - Transportation   . Lack of Transportation (Medical): No   . Lack of Transportation (Non-Medical): No  Physical Activity:  Sufficiently Active (09/24/2017)   Exercise Vital Sign   . Days of Exercise per Week: 7 days   . Minutes of Exercise per Session: 40 min  Stress: No Stress Concern Present (09/24/2017)   [place]   . Feeling of Stress : Not at all  Social Connections: Somewhat Isolated (09/24/2017)   Social Connection and Isolation Panel [NHANES]   . Frequency of Communication with Friends and Family: More than three times a week   . Frequency of Social Gatherings with Friends and Family: More than three times a week   . Attends Religious Services: More than 4 times per year   . Active Member of Clubs or Organizations: No   . Attends Archivist Meetings: Never   . Marital Status: Separated  Intimate Partner Violence: Not At Risk (09/24/2017)   Humiliation, Afraid, Rape, and Kick questionnaire   . Fear of Current or Ex-Partner: No   . Emotionally Abused: No   . Physically Abused: No   . Sexually Abused: No    Past Surgical History:  Procedure Laterality Date  . ABDOMINAL AORTA STENT    . CHOLECYSTECTOMY    . COLONOSCOPY  11/24/2009  . COLONOSCOPY N/A 10/09/2020   Procedure: COLONOSCOPY;  Surgeon: Mansouraty, Telford Nab., MD;  Location: [facility];  Service: Gastroenterology;  Laterality: N/A;  . COLONOSCOPY WITH PROPOFOL N/A 07/24/2020   Procedure: COLONOSCOPY WITH PROPOFOL;  Surgeon: Toledo, Benay Pike, MD;  Location: [facility];  Service: Gastroenterology;  Laterality: N/A;  . CORONARY ANGIOPLASTY WITH STENT PLACEMENT Left 05/03/2013   Procedure: CORONARY ANGIOPLASTY WITH STENT PLACEMENT (DES x 3 to SVG-OM graft); Location: [facility]  . CORONARY ARTERY BYPASS GRAFT N/A 01/21/2002   Procedure: 4V  CABG (LIMA-LAD, SVG-D1, SVG-RCA, SVG-OM); Location: [facility]  . 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   Procedure: DUPUYTREN CONTRACTURE RELEASE LEFT LONG FINGER;  Surgeon: Corky Mull, MD;  Location: [facility];  Service: Orthopedics;  Laterality: Left;  . DUPUYTREN CONTRACTURE RELEASE Right 10/18/2020   Procedure: DUPUYTREN CONTRACTURE RELEASE;  Surgeon: Corky Mull, MD;  Location: [facility];  Service: Orthopedics;  Laterality: Right;  . ENDOVASCULAR REPAIR/STENT GRAFT N/A 09/24/2017   Procedure: ENDOVASCULAR REPAIR/STENT GRAFT (27 mm x 12 cm RIGHT iliac limb extension to distal CIA);  Surgeon: Algernon Huxley, MD;  Location: [facility];  Service: Cardiovascular;  Laterality: N/A;  . ESOPHAGOGASTRODUODENOSCOPY  05/26/02  03/23/12  . ESOPHAGOGASTRODUODENOSCOPY (EGD) WITH PROPOFOL N/A 10/28/2016   Procedure: ESOPHAGOGASTRODUODENOSCOPY (EGD) WITH PROPOFOL;  Surgeon: Manya Silvas, MD;  Location: [facility] END[facility]COPY;  Service: Endoscopy;  Laterality: N/A;  . EUS N/A 10/09/2020   Procedure: LOWER ENDOSCOPIC ULTRASOUND (EUS);  Surgeon: Irving Copas., MD;  Location: [facility];  Service: Gastroenterology;  Laterality: N/A;  . INCISION AND DRAINAGE ABSCESS N/A 06/14/2020   Procedure: INCISION AND DRAINAGE ABSCESS;  Surgeon: Herbert Pun, MD;  Location: [facility];  Service: General;  Laterality: N/A;  . INGUINAL HERNIA REPAIR Left   . LEFT HEART CATH AND CORONARY ANGIOGRAPHY N/A 01/19/2018   Procedure: LEFT HEART CATH AND CORONARY ANGIOGRAPHY;  Surgeon: Teodoro Spray, MD;  Location: [facility];  Service: Cardiovascular;  Laterality: N/A;  . LEFT HEART CATH AND CORONARY ANGIOGRAPHY Left 11/14/1995   Procedure: CARDIAC CATHETERIZATION (PTCA); Location: [facility]; Surgeon: Doristine Bosworth, MD  . LEFT HEART CATH AND CORS/GRAFTS ANGIOGRAPHY Left 07/30/2014   Procedure: CARDIAC CATHETERIZATION; Location: [facility]  . PERIPHERAL VASCULAR CATHETERIZATION N/A 09/06/2014  Procedure: IVC Filter Removal;  Surgeon: Katha Cabal, MD;  Location: [facility];  Service: Cardiovascular;  Laterality: N/A;  . POLYPECTOMY  10/09/2020   Procedure: POLYPECTOMY;  Surgeon: Rush Landmark Telford Nab., MD;  Location: [facility];  Service: Gastroenterology;;  . Lia Foyer LIFTING INJECTION  10/09/2020   Procedure: SUBMUCOSAL LIFTING INJECTION;  Surgeon: Irving Copas., MD;  Location: [facility];  Service: Gastroenterology;;  . TRANSURETHRAL RESECTION OF PROSTATE N/A 02/20/2015   Procedure: TRANSURETHRAL RESECTION OF THE PROSTATE (TURP);  Surgeon: Hollice Espy, MD;  Location: [facility];  Service: Urology;  Laterality: N/A;    Family History  Problem Relation Age of Onset  . Cancer Mother 52       BREAST  . Diabetes Mother   . Coronary artery disease Father   . Heart attack Father   . Prostate cancer Brother   . Bladder Cancer Neg Hx   . Kidney cancer Neg Hx     Allergies  Allergen Reactions  . Ace Inhibitors Other (See Comments)    Other reaction(s): Cough  . Amoxicillin Rash    Has patient had a PCN reaction causing immediate rash, facial/tongue/throat swelling, SOB or lightheadedness with hypotension: Yes Has patient had a PCN reaction causing severe rash involving mucus membranes or skin necrosis: Unknown Has patient had a PCN reaction that required hospitalization: Unknown Has patient had a PCN reaction occurring within the last 10 years: No If all of the above answers are "NO", then may proceed with Cephalosporin use.  . Niacin Rash  . Penicillins Rash    Has patient had a PCN reaction causing immediate rash, facial/tongue/throat swelling, SOB or lightheadedness with hypotension: Yes Has patient had a PCN reaction causing severe rash involving mucus membranes or skin necrosis: Unknown Has patient had a PCN reaction that required hospitalization: Unknown Has patient had a PCN reaction occurring within the last 10 years: No If all of the  above answers are "NO", then may proceed with Cephalosporin use.  . Pravastatin Other (See Comments)    Other reaction(s): Muscle Pain  . Rosuvastatin Other (See Comments)    Other reaction(s): Other (See Comments) GI bleed  . Simvastatin Other (See Comments)    Other reaction(s): Muscle Pain       Latest Ref Rng & Units 08/31/2021   11:46 AM 08/30/2021   10:56 AM 07/20/2021   11:18 AM  CBC  WBC 4.0 - 10.5 K/uL 9.2  11.0  9.2   Hemoglobin 13.0 - 17.0 g/dL 14.7  14.9  14.5   Hematocrit 39.0 - 52.0 % 45.3  47.6  46.0   Platelets 150 - 400 K/uL 143  147  130       CMP     Component Value Date/Time   NA 139 08/31/2021 1146   NA 138 02/13/2014 1254   K 3.6 08/31/2021 1146   K 4.1 02/13/2014 1254   CL 108 08/31/2021 1146   CL 106 02/13/2014 1254   CO2 23 08/31/2021 1146   CO2 26 02/13/2014 1254   GLUCOSE 113 (H) 08/31/2021 1146   GLUCOSE 98 02/13/2014 1254   BUN 19 08/31/2021 1146   BUN 13 02/13/2014 1254   CREATININE 0.93 08/31/2021 1146   CREATININE 0.83 02/13/2014 1254   CALCIUM 9.2 08/31/2021 1146   CALCIUM 8.4 (L) 02/13/2014 1254   PROT 7.6 08/31/2021 1146   PROT 6.8 07/10/2011 0423   ALBUMIN 4.5 08/31/2021 1146   ALBUMIN 3.4 07/10/2011 0423   AST 38 08/31/2021  1146   AST 39 (H) 07/10/2011 0423   ALT 34 08/31/2021 1146   ALT 32 07/10/2011 0423   ALKPHOS 79 08/31/2021 1146   ALKPHOS 71 07/10/2011 0423   BILITOT 1.9 (H) 08/31/2021 1146   BILITOT 0.9 07/10/2011 0423   GFRNONAA >60 08/31/2021 1146   GFRNONAA >60 02/13/2014 1254   GFRNONAA >60 05/03/2013 0527   GFRAA >60 10/07/2019 0924   GFRAA >60 02/13/2014 1254   GFRAA >60 05/03/2013 0527     No results found.     Assessment & Plan:   1. Stenosis of carotid artery, unspecified laterality Recommend:  Given the patient's asymptomatic subcritical stenosis no further invasive testing or surgery at this time.  Duplex ultrasound shows <40% stenosis bilaterally.  Continue antiplatelet therapy as  prescribed Continue management of CAD, HTN and Hyperlipidemia Healthy heart diet,  encouraged exercise at least 4 times per week Follow up in 12 months with duplex ultrasound and physical exam    2. Infrarenal abdominal aortic aneurysm (AAA) without rupture ([facility]) Noninvasive studies demonstrate a slight change in diameter from 5.3 to 5.4 cm.  No leak continues to be detected.  This is essentially no change.  No changes at this time.  We will have patient return in 1 year  3. Type 2 diabetes mellitus with diabetic peripheral angiopathy without gangrene, unspecified whether long term insulin use ([place]) Continue hypoglycemic medications as already ordered, these medications have been reviewed and there are no changes at this time.  Hgb A1C to be monitored as already arranged by primary service   4. Peripheral vascular disease ([place]) The patient has noncompressible ABIs bilaterally but the toe pressures are normal with good toe waveforms.  We will continue to follow annually.   Current Outpatient Medications on File Prior to Visit  Medication Sig Dispense Refill  . albuterol (VENTOLIN HFA) 108 (90 Base) MCG/ACT inhaler SMARTSIG:2 inhalation Via Inhaler Every 6 Hours PRN    . amLODipine (NORVASC) 10 MG tablet Take 10 mg by mouth daily.     Marland Kitchen atorvastatin (LIPITOR) 80 MG tablet Take 80 mg by mouth daily.   11  . Cholecalciferol (VITAMIN D3) 50 MCG (2000 UT) CAPS Take 2,000 Units/day by mouth daily.    . dorzolamide (TRUSOPT) 2 % ophthalmic solution Place 1 drop into both eyes 2 (two) times daily.     . fluticasone (FLONASE) 50 MCG/ACT nasal spray Place 2 sprays into both nostrils daily.     Marland Kitchen gabapentin (NEURONTIN) 300 MG capsule Take 1 capsule (300 mg total) by mouth 3 (three) times daily. (Patient taking differently: Take 300 mg by mouth 2 (two) times daily.) 90 capsule 0  . HYDROcodone-acetaminophen (NORCO) 5-325 MG tablet Take 1-2 tablets by mouth every 6 (six) hours as needed for moderate pain  or severe pain. MAXIMUM TOTAL ACETAMINOPHEN DOSE IS 4000 MG PER DAY 20 tablet 0  . isosorbide mononitrate (IMDUR) 60 MG 24 hr tablet Take 1 tablet (60 mg total) by mouth daily. 30 tablet 0  . latanoprost (XALATAN) 0.005 % ophthalmic solution Place 1 drop into both eyes at bedtime.     Marland Kitchen losartan (COZAAR) 25 MG tablet Take 25 mg by mouth daily.    . metoprolol succinate (TOPROL-XL) 100 MG 24 hr tablet Take 100 mg by mouth daily.     . Multiple Vitamins-Minerals (MULTIVITAMIN MEN 50+ PO) Take 1 tablet by mouth daily.    . pantoprazole (PROTONIX) 40 MG tablet Take 40 mg by mouth daily.     Marland Kitchen  potassium chloride SA (K-DUR,KLOR-CON) 20 MEQ tablet Take 20 mEq by mouth daily.     . rivaroxaban (XARELTO) 20 MG TABS tablet Take 1 tablet (20 mg total) by mouth daily with supper. 30 tablet 3   No current facility-administered medications on file prior to visit.    There are no Patient Instructions on file for this visit. No follow-ups on file.   Kris Hartmann, NP

## 2021-10-04 ENCOUNTER — Other Ambulatory Visit: Payer: Self-pay | Admitting: *Deleted

## 2021-10-04 DIAGNOSIS — Z122 Encounter for screening for malignant neoplasm of respiratory organs: Secondary | ICD-10-CM

## 2021-10-04 DIAGNOSIS — F17210 Nicotine dependence, cigarettes, uncomplicated: Secondary | ICD-10-CM

## 2021-10-04 DIAGNOSIS — Z87891 Personal history of nicotine dependence: Secondary | ICD-10-CM

## 2021-10-05 ENCOUNTER — Other Ambulatory Visit: Payer: Self-pay

## 2021-10-05 NOTE — Telephone Encounter (Signed)
The pt has returned call and has been advised that Xarelto should be held 2 days prior.  The pt also wanted to confirm that EGD will be done as well as [place] EUS.  Both will be done as ordered.

## 2021-10-05 NOTE — Telephone Encounter (Signed)
Received the ok for xarelto 2 day hold from Dr Nehemiah Massed.  Letter to be faxed into Epic.    Left message on machine to call back

## 2021-10-08 ENCOUNTER — Encounter: Payer: Self-pay | Admitting: Pulmonary Disease

## 2021-10-08 ENCOUNTER — Ambulatory Visit (INDEPENDENT_AMBULATORY_CARE_PROVIDER_SITE_OTHER): Payer: Medicare PPO | Admitting: Pulmonary Disease

## 2021-10-08 DIAGNOSIS — Z87891 Personal history of nicotine dependence: Secondary | ICD-10-CM | POA: Diagnosis not present

## 2021-10-08 DIAGNOSIS — Z122 Encounter for screening for malignant neoplasm of respiratory organs: Secondary | ICD-10-CM

## 2021-10-08 NOTE — Patient Instructions (Signed)
Thank you for participating in the [facility] Screening Program. It was our pleasure to meet you today. We will call you with the results of your scan within the next few days. Your scan will be assigned a Lung RADS category score by the physicians reading the scans.  This Lung RADS score determines follow up scanning.  See below for description of categories, and follow up screening recommendations. We will be in touch to schedule your follow up screening annually or based on recommendations of our providers. We will fax a copy of your scan results to your Primary Care Physician, or the physician who referred you to the program, to ensure they have the results. Please call the office if you have any questions or concerns regarding your scanning experience or results.  Our office number is [phone]. Please speak with Denise Phelps, RN. , or  Denise Buckner RN, They are  our Lung Cancer Screening RN.'s If They are unavailable when you call, Please leave a message on the voice mail. We will return your call at our earliest convenience.This voice mail is monitored several times a day.  Remember, if your scan is normal, we will scan you annually as long as you continue to meet the criteria for the program. (Age 55-77, Current smoker or smoker who has quit within the last 15 years). If you are a smoker, remember, quitting is the single most powerful action that you can take to decrease your risk of lung cancer and other pulmonary, breathing related problems. We know quitting is hard, and we are here to help.  Please let us know if there is anything we can do to help you meet your goal of quitting. If you are a former smoker, congratulations. We are proud of you! Remain smoke free! Remember you can refer friends or family members through the number above.  We will screen them to make sure they meet criteria for the program. Thank you for helping us take better care of you by  participating in Lung Screening.  You can receive free nicotine replacement therapy ( patches, gum or mints) by calling 1-800-QUIT NOW. Please call so we can get you on the path to becoming  a non-smoker. I know it is hard, but you can do this!  Lung RADS Categories:  Lung RADS 1: no nodules or definitely non-concerning nodules.  Recommendation is for a repeat annual scan in 12 months.  Lung RADS 2:  nodules that are non-concerning in appearance and behavior with a very low likelihood of becoming an active cancer. Recommendation is for a repeat annual scan in 12 months.  Lung RADS 3: nodules that are probably non-concerning , includes nodules with a low likelihood of becoming an active cancer.  Recommendation is for a 6-month repeat screening scan. Often noted after an upper respiratory illness. We will be in touch to make sure you have no questions, and to schedule your 6-month scan.  Lung RADS 4 A: nodules with concerning findings, recommendation is most often for a follow up scan in 3 months or additional testing based on our provider's assessment of the scan. We will be in touch to make sure you have no questions and to schedule the recommended 3 month follow up scan.  Lung RADS 4 B:  indicates findings that are concerning. We will be in touch with you to schedule additional diagnostic testing based on our provider's  assessment of the scan.  Other options for assistance in smoking cessation (   As covered by your insurance benefits)  Hypnosis for smoking cessation  [employer] [phone]  Acupuncture for smoking cessation  [facility] [phone]   

## 2021-10-08 NOTE — Progress Notes (Signed)
Shared Decision Making Visit Lung Cancer Screening Program [phone])   Eligibility: Age 76 y.o. Pack Years Smoking History Calculation 20 (# packs/per year x # years smoked) Recent History of coughing up blood  no Unexplained weight loss? no ( >Than 15 pounds within the last 6 months ) Prior History Lung / other cancer yes - [facility] Lymphoma (2019) - 4 years in remission  (Diagnosis within the last 5 years already requiring surveillance chest CT Scans). Smoking Status Current Smoker between 5-10 cigarettes a day   Visit Components: Discussion included one or more decision making aids. yes Discussion included risk/benefits of screening. yes Discussion included potential follow up diagnostic testing for abnormal scans. yes Discussion included meaning and risk of over diagnosis. yes Discussion included meaning and risk of False Positives. yes Discussion included meaning of total radiation exposure. yes  Counseling Included: Importance of adherence to annual lung cancer LDCT screening. yes Impact of comorbidities on ability to participate in the program. yes Ability and willingness to under diagnostic treatment. yes  Smoking Cessation Counseling: Current Smokers:  Discussed importance of smoking cessation. yes Information about tobacco cessation classes and interventions provided to patient. yes Patient provided with "ticket" for LDCT Scan. yes Diagnosis Code: Tobacco Use Z72.0 Asymptomatic Patient yes  Counseling (Intermediate counseling: > three minutes counseling) Y1950 Information about tobacco cessation classes and interventions provided to patient. Yes Discussed with pt, pt to discuss smoking cessation options with PCP team at next visit and review AVS  Patient provided with "ticket" for LDCT Scan. yes Written Order for Lung Cancer Screening with LDCT placed in Epic. Yes (CT Chest Lung Cancer Screening Low Dose W/O CM) [id number] Z12.2-Screening of respiratory  organs Z87.891-Personal history of nicotine dependence   Lauraine Rinne, NP

## 2021-10-09 ENCOUNTER — Ambulatory Visit
Admission: RE | Admit: 2021-10-09 | Discharge: 2021-10-09 | Disposition: A | Discharge status: Home / Self Care | Payer: Medicare PPO | Source: Ambulatory Visit | Attending: Acute Care | Admitting: Acute Care

## 2021-10-09 DIAGNOSIS — Z87891 Personal history of nicotine dependence: Secondary | ICD-10-CM | POA: Diagnosis present

## 2021-10-09 DIAGNOSIS — F17210 Nicotine dependence, cigarettes, uncomplicated: Secondary | ICD-10-CM | POA: Insufficient documentation

## 2021-10-09 DIAGNOSIS — Z122 Encounter for screening for malignant neoplasm of respiratory organs: Secondary | ICD-10-CM | POA: Insufficient documentation

## 2021-10-11 ENCOUNTER — Other Ambulatory Visit: Payer: Self-pay | Admitting: Acute Care

## 2021-10-11 DIAGNOSIS — F17210 Nicotine dependence, cigarettes, uncomplicated: Secondary | ICD-10-CM

## 2021-10-11 DIAGNOSIS — Z122 Encounter for screening for malignant neoplasm of respiratory organs: Secondary | ICD-10-CM

## 2021-10-11 DIAGNOSIS — Z87891 Personal history of nicotine dependence: Secondary | ICD-10-CM

## 2021-10-23 ENCOUNTER — Other Ambulatory Visit: Payer: Self-pay | Admitting: Student

## 2021-10-23 ENCOUNTER — Other Ambulatory Visit (HOSPITAL_COMMUNITY): Payer: Self-pay | Admitting: Student

## 2021-10-23 DIAGNOSIS — G43119 Migraine with aura, intractable, without status migrainosus: Secondary | ICD-10-CM

## 2021-10-29 ENCOUNTER — Ambulatory Visit
Admission: RE | Admit: 2021-10-29 | Discharge: 2021-10-29 | Disposition: A | Discharge status: Home / Self Care | Payer: Medicare PPO | Source: Ambulatory Visit | Attending: Student | Admitting: Student

## 2021-10-29 ENCOUNTER — Encounter (HOSPITAL_COMMUNITY): Payer: Self-pay | Admitting: Gastroenterology

## 2021-10-29 DIAGNOSIS — G43119 Migraine with aura, intractable, without status migrainosus: Secondary | ICD-10-CM | POA: Diagnosis present

## 2021-10-29 MED ORDER — GADOBUTROL 1 MMOL/ML IV SOLN
9.0000 mL | Freq: Once | INTRAVENOUS | Status: AC | PRN
  Administered 2021-10-29: 9 mL via INTRAVENOUS

## 2021-10-29 NOTE — Progress Notes (Signed)
Attempted to obtain medical history via telephone, unable to reach at this time. HIPAA compliant voicemail message left requesting return call to pre surgical testing department. 

## 2021-11-05 ENCOUNTER — Encounter (HOSPITAL_COMMUNITY)
Admission: RE | Disposition: A | Discharge status: Home / Self Care | Payer: Self-pay | Source: Home / Self Care | Attending: Gastroenterology

## 2021-11-05 ENCOUNTER — Ambulatory Visit (HOSPITAL_BASED_OUTPATIENT_CLINIC_OR_DEPARTMENT_OTHER): Payer: Medicare PPO | Admitting: Certified Registered"

## 2021-11-05 ENCOUNTER — Encounter (HOSPITAL_COMMUNITY): Payer: Self-pay | Admitting: Gastroenterology

## 2021-11-05 ENCOUNTER — Encounter (INDEPENDENT_AMBULATORY_CARE_PROVIDER_SITE_OTHER): Payer: Self-pay

## 2021-11-05 ENCOUNTER — Ambulatory Visit (HOSPITAL_COMMUNITY): Payer: Medicare PPO | Admitting: Certified Registered"

## 2021-11-05 ENCOUNTER — Ambulatory Visit (HOSPITAL_COMMUNITY)
Admission: RE | Admit: 2021-11-05 | Discharge: 2021-11-05 | Disposition: A | Discharge status: Home / Self Care | Payer: Medicare PPO | Attending: Gastroenterology | Admitting: Gastroenterology

## 2021-11-05 ENCOUNTER — Other Ambulatory Visit: Payer: Self-pay

## 2021-11-05 DIAGNOSIS — Z7901 Long term (current) use of anticoagulants: Secondary | ICD-10-CM | POA: Diagnosis not present

## 2021-11-05 DIAGNOSIS — I252 Old myocardial infarction: Secondary | ICD-10-CM | POA: Diagnosis not present

## 2021-11-05 DIAGNOSIS — Z13810 Encounter for screening for upper gastrointestinal disorder: Secondary | ICD-10-CM | POA: Insufficient documentation

## 2021-11-05 DIAGNOSIS — I4891 Unspecified atrial fibrillation: Secondary | ICD-10-CM | POA: Insufficient documentation

## 2021-11-05 DIAGNOSIS — K644 Residual hemorrhoidal skin tags: Secondary | ICD-10-CM | POA: Insufficient documentation

## 2021-11-05 DIAGNOSIS — Z86718 Personal history of other venous thrombosis and embolism: Secondary | ICD-10-CM | POA: Insufficient documentation

## 2021-11-05 DIAGNOSIS — K3189 Other diseases of stomach and duodenum: Secondary | ICD-10-CM | POA: Insufficient documentation

## 2021-11-05 DIAGNOSIS — K449 Diaphragmatic hernia without obstruction or gangrene: Secondary | ICD-10-CM | POA: Insufficient documentation

## 2021-11-05 DIAGNOSIS — Z955 Presence of coronary angioplasty implant and graft: Secondary | ICD-10-CM | POA: Insufficient documentation

## 2021-11-05 DIAGNOSIS — Z9889 Other specified postprocedural states: Secondary | ICD-10-CM | POA: Diagnosis not present

## 2021-11-05 DIAGNOSIS — K219 Gastro-esophageal reflux disease without esophagitis: Secondary | ICD-10-CM | POA: Insufficient documentation

## 2021-11-05 DIAGNOSIS — I899 Noninfective disorder of lymphatic vessels and lymph nodes, unspecified: Secondary | ICD-10-CM | POA: Diagnosis not present

## 2021-11-05 DIAGNOSIS — D127 Benign neoplasm of rectosigmoid junction: Secondary | ICD-10-CM

## 2021-11-05 DIAGNOSIS — K2270 Barrett's esophagus without dysplasia: Secondary | ICD-10-CM | POA: Diagnosis not present

## 2021-11-05 DIAGNOSIS — M1990 Unspecified osteoarthritis, unspecified site: Secondary | ICD-10-CM

## 2021-11-05 DIAGNOSIS — K317 Polyp of stomach and duodenum: Secondary | ICD-10-CM | POA: Insufficient documentation

## 2021-11-05 DIAGNOSIS — I5022 Chronic systolic (congestive) heart failure: Secondary | ICD-10-CM | POA: Diagnosis not present

## 2021-11-05 DIAGNOSIS — I2510 Atherosclerotic heart disease of native coronary artery without angina pectoris: Secondary | ICD-10-CM | POA: Insufficient documentation

## 2021-11-05 DIAGNOSIS — Z85048 Personal history of other malignant neoplasm of rectum, rectosigmoid junction, and anus: Secondary | ICD-10-CM | POA: Insufficient documentation

## 2021-11-05 DIAGNOSIS — D128 Benign neoplasm of rectum: Secondary | ICD-10-CM

## 2021-11-05 DIAGNOSIS — K621 Rectal polyp: Secondary | ICD-10-CM | POA: Diagnosis not present

## 2021-11-05 DIAGNOSIS — I110 Hypertensive heart disease with heart failure: Secondary | ICD-10-CM | POA: Insufficient documentation

## 2021-11-05 DIAGNOSIS — K5730 Diverticulosis of large intestine without perforation or abscess without bleeding: Secondary | ICD-10-CM | POA: Diagnosis not present

## 2021-11-05 DIAGNOSIS — J449 Chronic obstructive pulmonary disease, unspecified: Secondary | ICD-10-CM | POA: Insufficient documentation

## 2021-11-05 DIAGNOSIS — C9110 Chronic lymphocytic leukemia of B-cell type not having achieved remission: Secondary | ICD-10-CM | POA: Insufficient documentation

## 2021-11-05 DIAGNOSIS — Z951 Presence of aortocoronary bypass graft: Secondary | ICD-10-CM | POA: Insufficient documentation

## 2021-11-05 DIAGNOSIS — K641 Second degree hemorrhoids: Secondary | ICD-10-CM | POA: Insufficient documentation

## 2021-11-05 DIAGNOSIS — G4730 Sleep apnea, unspecified: Secondary | ICD-10-CM | POA: Diagnosis not present

## 2021-11-05 DIAGNOSIS — K31A19 Gastric intestinal metaplasia without dysplasia, unspecified site: Secondary | ICD-10-CM | POA: Insufficient documentation

## 2021-11-05 DIAGNOSIS — I739 Peripheral vascular disease, unspecified: Secondary | ICD-10-CM | POA: Insufficient documentation

## 2021-11-05 DIAGNOSIS — F17210 Nicotine dependence, cigarettes, uncomplicated: Secondary | ICD-10-CM | POA: Insufficient documentation

## 2021-11-05 HISTORY — PX: FLEXIBLE SIGMOIDOSCOPY: SHX5431

## 2021-11-05 HISTORY — PX: BIOPSY: SHX5522

## 2021-11-05 HISTORY — PX: HEMOSTASIS CLIP PLACEMENT: SHX6857

## 2021-11-05 HISTORY — PX: POLYPECTOMY: SHX5525

## 2021-11-05 HISTORY — PX: SUBMUCOSAL TATTOO INJECTION: SHX6856

## 2021-11-05 HISTORY — PX: EUS: SHX5427

## 2021-11-05 HISTORY — PX: ESOPHAGOGASTRODUODENOSCOPY (EGD) WITH PROPOFOL: SHX5813

## 2021-11-05 LAB — GLUCOSE, CAPILLARY: Glucose-Capillary: 107 mg/dL — ABNORMAL HIGH (ref 70–99)

## 2021-11-05 SURGERY — ULTRASOUND, LOWER GI TRACT, ENDOSCOPIC
Anesthesia: Monitor Anesthesia Care

## 2021-11-05 MED ORDER — LIDOCAINE 2% (20 MG/ML) 5 ML SYRINGE
INTRAMUSCULAR | Status: DC | PRN
  Administered 2021-11-05: 60 mg via INTRAVENOUS
  Administered 2021-11-05: 40 mg via INTRAVENOUS

## 2021-11-05 MED ORDER — PROPOFOL 500 MG/50ML IV EMUL
INTRAVENOUS | Status: DC | PRN
  Administered 2021-11-05: 150 ug/kg/min via INTRAVENOUS

## 2021-11-05 MED ORDER — PANTOPRAZOLE SODIUM 40 MG PO TBEC: 40.0000 mg | DELAYED_RELEASE_TABLET | Freq: Two times a day (BID) | ORAL | 6 refills | Status: AC

## 2021-11-05 MED ORDER — LACTATED RINGERS IV SOLN: INTRAVENOUS | Status: DC

## 2021-11-05 MED ORDER — SPOT INK MARKER SYRINGE KIT
PACK | SUBMUCOSAL | Status: DC | PRN
  Administered 2021-11-05: 1.5 mL via SUBMUCOSAL

## 2021-11-05 MED ORDER — SPOT INK MARKER SYRINGE KIT
PACK | SUBMUCOSAL | Status: AC
  Filled 2021-11-05: qty 5

## 2021-11-05 MED ORDER — RIVAROXABAN 20 MG PO TABS: 20.0000 mg | ORAL_TABLET | Freq: Every day | ORAL | 3 refills | Status: DC

## 2021-11-05 MED ORDER — SODIUM CHLORIDE 0.9 % IV SOLN: INTRAVENOUS | Status: DC

## 2021-11-05 MED ORDER — PROPOFOL 10 MG/ML IV BOLUS
INTRAVENOUS | Status: DC | PRN
  Administered 2021-11-05 (×2): 20 mg via INTRAVENOUS

## 2021-11-05 MED ORDER — PROPOFOL 1000 MG/100ML IV EMUL
INTRAVENOUS | Status: AC
  Filled 2021-11-05: qty 100

## 2021-11-05 MED ORDER — PROPOFOL 500 MG/50ML IV EMUL
INTRAVENOUS | Status: AC
  Filled 2021-11-05: qty 50

## 2021-11-05 MED ORDER — ONDANSETRON HCL 4 MG/2ML IJ SOLN
INTRAMUSCULAR | Status: DC | PRN
  Administered 2021-11-05: 4 mg via INTRAVENOUS

## 2021-11-05 MED ORDER — PHENYLEPHRINE 80 MCG/ML (10ML) SYRINGE FOR IV PUSH (FOR BLOOD PRESSURE SUPPORT)
PREFILLED_SYRINGE | INTRAVENOUS | Status: DC | PRN
  Administered 2021-11-05: 80 ug via INTRAVENOUS
  Administered 2021-11-05: 120 ug via INTRAVENOUS
  Administered 2021-11-05: 80 ug via INTRAVENOUS

## 2021-11-05 SURGICAL SUPPLY — 15 items

## 2021-11-05 NOTE — Anesthesia Procedure Notes (Signed)
Procedure Name: MAC Date/Time: 11/05/2021 11:00 AM  Performed by: Eben Burow, CRNAPre-anesthesia Checklist: Patient identified, Emergency Drugs available, Suction available, Patient being monitored and Timeout performed Oxygen Delivery Method: Simple face mask Placement Confirmation: positive ETCO2

## 2021-11-05 NOTE — Anesthesia Postprocedure Evaluation (Signed)
Anesthesia Post Note  Patient: Martin Adkins  Procedure(s) Performed: LOWER ENDOSCOPIC ULTRASOUND (EUS) ESOPHAGOGASTRODUODENOSCOPY (EGD) WITH PROPOFOL BIOPSY POLYPECTOMY SUBMUCOSAL TATTOO INJECTION HEMOSTASIS CLIP PLACEMENT FLEXIBLE SIGMOIDOSCOPY     Patient location during evaluation: Endoscopy Anesthesia Type: MAC Level of consciousness: awake and alert, patient cooperative and oriented Pain management: pain level controlled Vital Signs Assessment: post-procedure vital signs reviewed and stable Respiratory status: nonlabored ventilation, spontaneous breathing and respiratory function stable Cardiovascular status: blood pressure returned to baseline and stable Postop Assessment: no apparent nausea or vomiting and able to ambulate Anesthetic complications: no   No notable events documented.  Last Vitals:  Vitals:   11/05/21 1220 11/05/21 1227  BP: 129/79 139/77  Pulse: (!) 58 (!) 52  Resp: 18 17  Temp:    SpO2: 94% 95%    Last Pain:  Vitals:   11/05/21 1209  TempSrc: Temporal  PainSc: Asleep                 Marvene Strohm,E. Hailly Fess

## 2021-11-05 NOTE — H&P (Signed)
GASTROENTEROLOGY PROCEDURE H&P NOTE   Primary Care Physician: [name], MD  HPI: Martin Adkins is a 76 y.o. 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  Past Medical History:  Diagnosis Date   AAA (abdominal aortic aneurysm) without rupture ([place])    a.) [place] 02/09/04 - 3.1 x 3.4 cm. b.) [place] 02/14/05 - 3.62 x 3.63 cm. c.) [place] 02/25/06 - 3.97 x 4.0 cm. d.) CT 07/09/11 - 4.9 cm. e.) s/p EVAR 2013. f.) enlarging AAA --> back to the OR on 09/24/2017 for placement of a 27 mm x 12 cm RIGHT iliac extension limb down to distal CIA.   Aortic atherosclerosis (HCC)    APS (antiphospholipid syndrome) (HCC)    Arthritis    Atrial fibrillation (HCC)    a.) CHA2DS2-VASc Score = 6 (age, CHF, HTN, prior DVT x 2, prior MI). b.) on rivaroxaban   B12 deficiency 03/21/2017   Barrett's esophagus    BPH (benign prostatic hyperplasia)    CAD (coronary artery disease)    a.) LHC 11/14/95 --> 95% mLCx, 95% OM1; PTCA. b.) 4v CABG 2004 --> LIMA-LAD, SVG-D1,RCA,OM. c.) NSTEMI 05/02/2013 --> PCI with DES x 3 to SVG-OM in 2 distinct areas with filter. d.) Inf STEMI 07/30/14 - LHC --> 50% LM, 90% mLCx, 100% OM2, 100% RPDA; LIMA-LAD patent; 100% occ of SVG OM2,OM3,PDA; med mgmt. e.) LHC 01/19/18 - patent LIMA-LAD; other grafts occ; not amenable to PCI/further bypass.   Cardiomyopathy ([place])    Chronic anticoagulation    a.) Rivaroxaban   CLL (chronic lymphocytic leukemia) ([place]) 05/24/2017   DVT (deep venous thrombosis) (HCC)    GERD (gastroesophageal reflux disease)    HFrEF (heart failure with reduced ejection fraction) ([place])    a.) TTE 02/22/2020 --> mod. LV systolic dysfunction; LVEF 35-40%.   History of hiatal hernia    History of shingles 2013   Hyperlipemia    Hyperplastic polyps of stomach    a.) Bx on 07/24/2020 --> well differentiated NET; WHO grade I.    Hypertension    NSTEMI (non-ST elevated myocardial infarction) ([place]) 05/02/2013   a.) LHC 05/03/2013 --> 50% LM, 100% LAD, 75% mLCx, 100% dLCx, 60% mRCA; patient LIMA-LAD, occluded SVG-D1 and SVG-PDA. SVG-OM with extensive thrombus and distal 99% lesion. HIGH RISK PCI performed at [facility] with placement of DES x 3 to 2 distinct areas of SVG with filter protection.   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 No intervention; medical management.   Valvular regurgitation    a.) TTE 02/22/2020 --> LVEF 35-40%; mild panvalvular.   Past Surgical History:  Procedure Laterality Date   ABDOMINAL AORTA STENT     CHOLECYSTECTOMY     COLONOSCOPY  11/24/2009   COLONOSCOPY N/A 10/09/2020   Procedure: COLONOSCOPY;  Surgeon: Rush Landmark Telford Nab., MD;  Location: [facility];  Service: Gastroenterology;  Laterality: N/A;   COLONOSCOPY WITH PROPOFOL N/A 07/24/2020   Procedure: COLONOSCOPY WITH PROPOFOL;  Surgeon: Toledo, Benay Pike, MD;  Location: [facility];  Service: Gastroenterology;  Laterality: N/A;   CORONARY ANGIOPLASTY WITH STENT PLACEMENT Left 05/03/2013   Procedure: CORONARY ANGIOPLASTY WITH STENT PLACEMENT (DES x 3 to SVG-OM graft); Location: [facility]   CORONARY ARTERY BYPASS GRAFT N/A 01/21/2002   Procedure: 4V CABG (LIMA-LAD, SVG-D1, SVG-RCA, SVG-OM); Location: [facility]   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    Procedure: DUPUYTREN CONTRACTURE RELEASE LEFT LONG FINGER;  Surgeon: Corky Mull, MD;  Location: [facility];  Service: Orthopedics;  Laterality: Left;   DUPUYTREN CONTRACTURE RELEASE Right 10/18/2020   Procedure: DUPUYTREN CONTRACTURE RELEASE;  Surgeon: Corky Mull, MD;  Location: [facility];  Service: Orthopedics;  Laterality: Right;   ENDOVASCULAR REPAIR/STENT GRAFT N/A 09/24/2017   Procedure: ENDOVASCULAR REPAIR/STENT GRAFT (27 mm x 12 cm RIGHT iliac limb extension to distal CIA);  Surgeon: Algernon Huxley, MD;  Location: [facility];  Service: Cardiovascular;  Laterality: N/A;   ESOPHAGOGASTRODUODENOSCOPY  05/26/02  03/23/12   ESOPHAGOGASTRODUODENOSCOPY (EGD) WITH PROPOFOL N/A 10/28/2016   Procedure: ESOPHAGOGASTRODUODENOSCOPY (EGD) WITH PROPOFOL;  Surgeon: Manya Silvas, MD;  Location: [facility];  Service: Endoscopy;  Laterality: N/A;   EUS N/A 10/09/2020   Procedure: LOWER ENDOSCOPIC ULTRASOUND (EUS);  Surgeon: Irving Copas., MD;  Location: [facility];  Service: Gastroenterology;  Laterality: N/A;   INCISION AND DRAINAGE ABSCESS N/A 06/14/2020   Procedure: INCISION AND DRAINAGE ABSCESS;  Surgeon: Herbert Pun, MD;  Location: [facility];  Service: General;  Laterality: N/A;   INGUINAL HERNIA REPAIR Left    LEFT HEART CATH AND CORONARY ANGIOGRAPHY N/A 01/19/2018   Procedure: LEFT HEART CATH AND CORONARY ANGIOGRAPHY;  Surgeon: Teodoro Spray, MD;  Location: [facility];  Service: Cardiovascular;  Laterality: N/A;   LEFT HEART CATH AND CORONARY ANGIOGRAPHY Left 11/14/1995   Procedure: CARDIAC CATHETERIZATION (PTCA); Location: [facility]; Surgeon: Doristine Bosworth, MD   LEFT HEART CATH AND CORS/GRAFTS ANGIOGRAPHY Left 07/30/2014   Procedure: CARDIAC CATHETERIZATION; Location: [facility]   PERIPHERAL VASCULAR CATHETERIZATION N/A 09/06/2014   Procedure: IVC Filter Removal;  Surgeon: Katha Cabal, MD;  Location: [facility];  Service: Cardiovascular;   Laterality: N/A;   POLYPECTOMY  10/09/2020   Procedure: POLYPECTOMY;  Surgeon: Rush Landmark Telford Nab., MD;  Location: [facility];  Service: Gastroenterology;;   SUBMUCOSAL LIFTING INJECTION  10/09/2020   Procedure: SUBMUCOSAL LIFTING INJECTION;  Surgeon: Irving Copas., MD;  Location: [facility];  Service: Gastroenterology;;   TRANSURETHRAL RESECTION OF PROSTATE N/A 02/20/2015   Procedure: TRANSURETHRAL RESECTION OF THE PROSTATE (TURP);  Surgeon: Hollice Espy, MD;  Location: [facility];  Service: Urology;  Laterality: N/A;   Current Facility-Administered Medications  Medication Dose Route Frequency Provider Last Rate Last Admin   0.9 %  sodium chloride infusion   Intravenous Continuous Mansouraty, Telford Nab., MD       lactated ringers infusion   Intravenous Continuous Mansouraty, Telford Nab., MD        Current Facility-Administered Medications:    0.9 %  sodium chloride infusion, , Intravenous, Continuous, Mansouraty, Telford Nab., MD   lactated ringers infusion, , Intravenous, Continuous, Mansouraty, Telford Nab., MD Allergies  Allergen Reactions   Ace Inhibitors Other (See Comments)    Other reaction(s): Cough  Amoxicillin Rash    Has patient had a PCN reaction causing immediate rash, facial/tongue/throat swelling, SOB or lightheadedness with hypotension: Yes Has patient had a PCN reaction causing severe rash involving mucus membranes or skin necrosis: Unknown Has patient had a PCN reaction that required hospitalization: Unknown Has patient had a PCN reaction occurring within the last 10 years: No If all of the above answers are "NO", then may proceed with Cephalosporin use.   Niacin Rash   Penicillins Rash    Has patient had a PCN reaction causing immediate rash, facial/tongue/throat swelling, SOB or lightheadedness with hypotension: Yes Has patient had a PCN reaction causing severe rash involving mucus membranes or skin necrosis: Unknown Has patient had a PCN reaction  that required hospitalization: Unknown Has patient had a PCN reaction occurring within the last 10 years: No If all of the above answers are "NO", then may proceed with Cephalosporin use.   Pravastatin Other (See Comments)    Other reaction(s): Muscle Pain   Rosuvastatin Other (See Comments)    Other reaction(s): Other (See Comments) GI bleed   Simvastatin Other (See Comments)    Other reaction(s): Muscle Pain   Family History  Problem Relation Age of Onset   Cancer Mother 48       BREAST   Diabetes Mother    Coronary artery disease Father    Heart attack Father    Prostate cancer Brother    Bladder Cancer Neg Hx    Kidney cancer Neg Hx    Social History   Socioeconomic History   Marital status: Legally Separated    Spouse name: Not on file   Number of children: Not on file   Years of education: Not on file   Highest education level: Not on file  Occupational History   Not on file  Tobacco Use   Smoking status: Some Days    Packs/day: 0.01    Years: 20.00    Total pack years: 0.20    Types: Cigarettes   Smokeless tobacco: Never   Tobacco comments:    sometimes 2-3 a day  Vaping Use   Vaping Use: Never used  Substance and Sexual Activity   Alcohol use: Yes    Alcohol/week: 1.0 - 2.0 standard drink of alcohol    Types: 1 - 2 Shots of liquor per week    Comment: casual   Drug use: No   Sexual activity: Never  Other Topics Concern   Not on file  Social History Narrative   Not on file   Social Determinants of Health   Financial Resource Strain: Low Risk  (09/24/2017)   Overall Financial Resource Strain (CARDIA)    Difficulty of Paying Living Expenses: Not hard at all  Food Insecurity: No Food Insecurity (09/24/2017)   Hunger Vital Sign    Worried About Running Out of Food in the Last Year: Never true    Ran Out of Food in the Last Year: Never true  Transportation Needs: No Transportation Needs (09/24/2017)   PRAPARE - Hydrologist  (Medical): No    Lack of Transportation (Non-Medical): No  Physical Activity: Sufficiently Active (09/24/2017)   Exercise Vital Sign    Days of Exercise per Week: 7 days    Minutes of Exercise per Session: 40 min  Stress: No Stress Concern Present (09/24/2017)   [place]    Feeling of Stress : Not at all  Social Connections:  Somewhat Isolated (09/24/2017)   Social Connection and Isolation Panel [NHANES]    Frequency of Communication with Friends and Family: More than three times a week    Frequency of Social Gatherings with Friends and Family: More than three times a week    Attends Religious Services: More than 4 times per year    Active Member of Clubs or Organizations: No    Attends Archivist Meetings: Never    Marital Status: Separated  Intimate Partner Violence: Not At Risk (09/24/2017)   Humiliation, Afraid, Rape, and Kick questionnaire    Fear of Current or Ex-Partner: No    Emotionally Abused: No    Physically Abused: No    Sexually Abused: No    Physical Exam: Today's Vitals   11/05/21 0857  BP: 136/72  Pulse: (!) 59  Resp: 20  Temp: 97.8 F (36.6 C)  TempSrc: Oral  SpO2: 99%  Weight: 97.5 kg  Height: 6' (1.829 m)  PainSc: 7    Body mass index is 29.16 kg/m. GEN: NAD EYE: Sclerae anicteric ENT: MMM CV: Non-tachycardic GI: Soft, NT/ND NEURO:  Alert & Oriented x 3  Lab Results: No results for input(s): "WBC", "HGB", "HCT", "PLT" in the last 72 hours. BMET No results for input(s): "NA", "K", "CL", "CO2", "GLUCOSE", "BUN", "CREATININE", "CALCIUM" in the last 72 hours. LFT No results for input(s): "PROT", "ALBUMIN", "AST", "ALT", "ALKPHOS", "BILITOT", "BILIDIR", "IBILI" in the last 72 hours. PT/INR No results for input(s): "LABPROT", "INR" in the last 72 hours.   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  The risks and benefits of endoscopic evaluation/treatment were discussed with the patient and/or family; these include but are not limited to the risk of perforation, infection, bleeding, missed lesions, lack of diagnosis, severe illness requiring hospitalization, as well as anesthesia and sedation related illnesses.  The patient's history has been reviewed, patient examined, no change in status, and deemed stable for procedure.  The patient and/or family is agreeable to proceed.    Justice Britain, MD [facility] Office # [phone]

## 2021-11-05 NOTE — Op Note (Signed)
[facility] Patient Name: Martin Adkins Procedure Date: 11/05/2021 MRN: [id number] Attending MD: Justice Britain , MD, [id number] Date of Birth: [birth year] CSN: [id number] Age: 76 Admit Type: Outpatient Procedure:                Upper GI endoscopy Indications:              Surveillance for malignancy due to personal history                            of Barrett's esophagus, Heartburn Providers:                Justice Britain, MD, Doristine Johns, RN, Cletis Athens, Technician Referring MD:             Earlie Server, MD, Andrey Farmer MD, MD Medicines:                Monitored Anesthesia Care Complications:            No immediate complications. Estimated Blood Loss:     Estimated blood loss was minimal. Procedure:                Pre-Anesthesia Assessment:                           - Prior to the procedure, a History and Physical                            was performed, and patient medications and                            allergies were reviewed. The patient's tolerance of                            previous anesthesia was also reviewed. The risks                            and benefits of the procedure and the sedation                            options and risks were discussed with the patient.                            All questions were answered, and informed consent                            was obtained. Prior Anticoagulants: The patient has                            taken Xarelto (rivaroxaban), last dose was 2 days                            prior to procedure. ASA Grade Assessment: III - A  patient with severe systemic disease. After                            reviewing the risks and benefits, the patient was                            deemed in satisfactory condition to undergo the                            procedure.                           After obtaining informed consent, the endoscope was                             passed under direct vision. Throughout the                            procedure, the patient's blood pressure, pulse, and                            oxygen saturations were monitored continuously. The                            [id number] ([id number] Olympus therapeutic endoscope                            was introduced through the mouth, and advanced to                            the second part of duodenum. The upper GI endoscopy                            was accomplished without difficulty. The patient                            tolerated the procedure. Scope In: Scope Out: Findings:      No gross lesions were noted in the proximal esophagus and in the mid       esophagus.      One tongue of salmon-colored mucosa was present from 39 to 40 cm and       scattered islands of salmon-colored mucosa were present from 39 to 40       cm. No other visible abnormalities were present. Biopsies were taken       with a cold forceps for histology.      A 2 cm hiatal hernia was present.      Diffuse granular mucosa was found in the entire examined stomach.       Biopsies were taken with a cold forceps for histology and Helicobacter       pylori testing.      A single 9 mm sessile polypoid lesion with no bleeding was found in the       second portion of the duodenum. The lesion was removed with a cold       snare. Resection and retrieval were complete. To prevent bleeding  post-intervention, one hemostatic clip was successfully placed (MR       conditional). Clip manufacturer: [employer]. There was no       bleeding at the end of the procedure. Area proximal to the resection was       tattooed with an injection of Spot (carbon black).      No other gross lesions were noted in the duodenal bulb, in the first       portion of the duodenum and in the second portion of the duodenum. Impression:               - No gross lesions in the proximal esophagus and in                             the mid esophagus.                           - Salmon-colored mucosa consistent with                            short-segment Barrett's esophagus distally.                            Biopsied.                           - 2 cm hiatal hernia.                           - Granular gastric mucosa. Biopsied.                           - A single duodenal polypoid lesion in D2. Resected                            and retrieved. Clip (MR conditional) was placed.                            Clip manufacturer: [employer]. Tattooed                            proximally.                           - No other gross lesions in the duodenal bulb, in                            the first portion of the duodenum and in the second                            portion of the duodenum. Moderate Sedation:      Not Applicable - Patient had care per Anesthesia. Recommendation:           - Proceed to scheduled Flexible Sigmoidoscopy.                           - Increase PPI to 40 mg twice daily.                           -  Continue present medications otherwise.                           - If this returns as adenomatous tissue, then                            repeat endoscopy would be recommended in 1 year for                            evaluation and follow-up of the site.                           - Otherwise if negative for adenomatous tissue,                            follow-up upper endoscopy likely in 3 years would                            be recommended in the setting of previously known                            Barrett's esophagus (pending no overt dysplasia is                            found on final pathology).                           - The findings and recommendations were discussed                            with the patient. Procedure Code(s):        --- Professional ---                           [phone], Esophagogastroduodenoscopy, flexible,                             transoral; with removal of tumor(s), polyp(s), or                            other lesion(s) by snare technique Diagnosis Code(s):        --- Professional ---                           K22.70, Barrett's esophagus without dysplasia                           K44.9, Diaphragmatic hernia without obstruction or                            gangrene                           K31.89, Other diseases of stomach and duodenum  K31.7, Polyp of stomach and duodenum                           R12, Heartburn CPT copyright 2022 American Medical Association. All rights reserved. The codes documented in this report are preliminary and upon coder review may  be revised to meet current compliance requirements. Justice Britain, MD 11/05/2021 12:09:39 PM Number of Addenda: 0

## 2021-11-05 NOTE — Anesthesia Preprocedure Evaluation (Addendum)
Anesthesia Evaluation  Patient identified by MRN, date of birth, ID band Patient awake    Reviewed: Allergy & Precautions, NPO status , Patient's Chart, lab work & pertinent test results, reviewed documented beta blocker date and time   History of Anesthesia Complications Negative for: history of anesthetic complications  Airway Mallampati: I  TM Distance: >3 FB Neck ROM: Full    Dental  (+) Missing, Chipped, Dental Advisory Given   Pulmonary sleep apnea and Continuous Positive Airway Pressure Ventilation , COPD,  COPD inhaler, Current Smoker and Patient abstained from smoking.,    breath sounds clear to auscultation       Cardiovascular hypertension, Pt. on medications and Pt. on home beta blockers (-) angina+ CAD, + Past MI, + Cardiac Stents, + CABG, + Peripheral Vascular Disease and + DVT  + dysrhythmias Atrial Fibrillation  Rhythm:Irregular Rate:Normal  '20 CATH:  Ost LAD to Prox LAD lesion is 90% stenosed. Prox LAD lesion is 100% stenosed. Ost 1st Diag to 1st Diag lesion is 90% stenosed. Origin lesion is 100% stenosed. Origin to Prox Graft lesion is 100% stenosed. Mid Graft to Dist Graft lesion is 100% stenosed. Prox Graft lesion is 99% stenosed. The left ventricular ejection fraction is 35-45% by visual estimate. There is no aortic valve stenosis. There is no mitral valve stenosis and no mitral valve prolapse evident  '22 ECHO: MODERATE LV SYSTOLIC DYSFUNCTION  NORMAL RIGHT VENTRICULAR SYSTOLIC FUNCTION  NO VALVULAR STENOSIS  MILD AR, TR, PR  TRIVIAL MR  EF 35-40%     Neuro/Psych negative neurological ROS     GI/Hepatic Neg liver ROS, hiatal hernia, GERD  Medicated and Controlled,  Endo/Other    Renal/GU negative Renal ROS     Musculoskeletal  (+) Arthritis ,   Abdominal   Peds  Hematology CLL Xarelto   Anesthesia Other Findings   Reproductive/Obstetrics                            Anesthesia Physical Anesthesia Plan  ASA: 4  Anesthesia Plan: MAC   Post-op Pain Management: Minimal or no pain anticipated   Induction:   PONV Risk Score and Plan: 0  Airway Management Planned: Natural Airway and Simple Face Mask  Additional Equipment: None  Intra-op Plan:   Post-operative Plan:   Informed Consent: I have reviewed the patients History and Physical, chart, labs and discussed the procedure including the risks, benefits and alternatives for the proposed anesthesia with the patient or authorized representative who has indicated his/her understanding and acceptance.     Dental advisory given  Plan Discussed with: CRNA and Surgeon  Anesthesia Plan Comments:       Anesthesia Quick Evaluation

## 2021-11-05 NOTE — Transfer of Care (Signed)
Immediate Anesthesia Transfer of Care Note  Patient: Martin Adkins  Procedure(s) Performed: LOWER ENDOSCOPIC ULTRASOUND (EUS) ESOPHAGOGASTRODUODENOSCOPY (EGD) WITH PROPOFOL BIOPSY POLYPECTOMY SUBMUCOSAL TATTOO INJECTION HEMOSTASIS CLIP PLACEMENT FLEXIBLE SIGMOIDOSCOPY  Patient Location: PACU and Endoscopy Unit  Anesthesia Type:MAC  Level of Consciousness: drowsy and responds to stimulation  Airway & Oxygen Therapy: Patient Spontanous Breathing and Patient connected to face mask oxygen  Post-op Assessment: Report given to RN and Post -op Vital signs reviewed and stable  Post vital signs: Reviewed and stable  Last Vitals:  Vitals Value Taken Time  BP 103/69 11/05/21 1210  Temp 36.2 C 11/05/21 1209  Pulse 55 11/05/21 1211  Resp 15 11/05/21 1211  SpO2 100 % 11/05/21 1211  Vitals shown include unvalidated device data.  Last Pain:  Vitals:   11/05/21 1209  TempSrc: Temporal  PainSc: Asleep         Complications: No notable events documented.

## 2021-11-05 NOTE — Op Note (Signed)
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 Estimated Blood Loss:     Estimated blood loss was minimal. Procedure:                Pre-Anesthesia Assessment:                           - Prior to the procedure, a History and Physical                            was performed, and patient medications and                            allergies were reviewed. The patient's tolerance of                            previous anesthesia was also reviewed. The risks                            and benefits of the procedure and the sedation                            options and risks were discussed with the patient.                            All questions were answered, and informed consent                            was obtained. Prior Anticoagulants: The patient has                            taken Xarelto (rivaroxaban), last dose was 2 days                            prior to procedure. ASA Grade Assessment: III - A  patient with severe systemic disease. After                            reviewing the risks and benefits, the patient was                            deemed in satisfactory condition to undergo the                            procedure.                           After obtaining informed consent, the endoscope was                             passed under direct vision. Throughout the                            procedure, the patient's blood pressure, pulse, and                            oxygen saturations were monitored continuously. The                            [id number] ([id number]) Olympus therapeutic endoscope                            was introduced through the anus and advanced to the                            the descending colon. The [id number]-[id number] ([id number])                            Olympus radial ultrasound scope was introduced                            through the anus and advanced to the the sigmoid                            colon for ultrasound. The lower EUS was                            accomplished without difficulty. The patient                            tolerated the procedure. Scope In: Scope Out: Findings:      ENDOSCOPIC FINDING: :      Extensive amounts of semi-liquid stool was found in the recto-sigmoid       colon, in the sigmoid colon and in the descending colon, interfering       with visualization. Lavage of the area was performed using copious       amounts, resulting in clearance with fair visualization.      Multiple small post polypectomy scars were found in the rectum and in  the recto-sigmoid colon. There was no evidence of the previous polyp at       any of the sites.      Greater than 20 sessile polyps were found in the rectum and       recto-sigmoid colon. The polyps were 2 to 7 mm in size. 7 of these       polyps were removed with a cold snare. Resection and retrieval were       complete.      Multiple small-mouthed diverticula were found in the recto-sigmoid colon       and sigmoid colon.      Non-bleeding non-thrombosed external and internal hemorrhoids were found       during retroflexion, during perianal exam and during digital exam. The       hemorrhoids were Grade II (internal hemorrhoids that prolapse but reduce       spontaneously).      ENDOSONOGRAPHIC FINDING:  :      The rectum was normal on echoultrasound.      The perirectal space was normal on echoultrasound.      No malignant-appearing lymph nodes were visualized in the perirectal       region and in the left iliac region.      The internal anal sphincter was visualized endosonographically and       appeared normal. Impression:               FLEX Impression:                           - Stool in the recto-sigmoid colon, in the sigmoid                            colon and in the descending colon ?" lavaged with                            fair visualization.                           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                           - Greater than 20, 2 to 7 mm polyps in the rectum                            and at the recto-sigmoid colon; 7 of these were                            removed with a cold snare. Resected and retrieved.                           - Diverticulosis in the recto-sigmoid colon and in                            the sigmoid colon.                           -  Non-bleeding non-thrombosed external and internal                            hemorrhoids.                           EUS impression:                           - Endosonographic images of the rectum were                            unremarkable.                           - Endosonographic images of the perirectal space                            were unremarkable.                           - No malignant-appearing lymph nodes were                            visualized endosonographically in the perirectal                            region and in the left iliac region.                           - The internal anal sphincter was visualized                            endosonographically and appeared normal. Moderate Sedation:      Not Applicable - Patient had care per Anesthesia. Recommendation:           - The patient will be observed  post-procedure,                            until all discharge criteria are met.                           - Discharge patient to home.                           - Patient has a contact number available for                            emergencies. The signs and symptoms of potential                            delayed complications were discussed with the                            patient. Return to normal activities tomorrow.  Written discharge instructions were provided to the                            patient.                           - May restart Xarelto on 11/1.                           - Await path results.                           - If no evidence of neuroendocrine tumor found on                            any of the polyps that were resected, recommend                            repeat lower EUS in 1-2 years.                           - The findings and recommendations were discussed                            with the patient. Procedure Code(s):        --- Professional ---                           [phone], Sigmoidoscopy, flexible; with endoscopic                            ultrasound examination                           [phone], Sigmoidoscopy, flexible; with removal of                            tumor(s), polyp(s), or other lesion(s) by snare                            technique Diagnosis Code(s):        --- Professional ---                           K64.1, Second degree hemorrhoids                           Z98.890, Other specified postprocedural states                           D12.8, Benign neoplasm of rectum                           D12.7, Benign neoplasm of rectosigmoid junction                           I89.9, Noninfective disorder of lymphatic vessels  and lymph nodes, unspecified                           K57.30, Diverticulosis of large intestine without                            perforation or abscess without  bleeding CPT copyright 2022 [employer]. All rights reserved. The codes documented in this report are preliminary and upon coder review may  be revised to meet current compliance requirements. Gabriel Mansouraty, MD 11/05/2021 12:20:10 PM Number of Addenda: 0 

## 2021-11-05 NOTE — Discharge Instructions (Signed)
YOU HAD AN ENDOSCOPIC PROCEDURE TODAY: Refer to the procedure report and other information in the discharge instructions given to you for any specific questions about what was found during the examination. If this information does not answer your questions, please call [facility] office at [phone] to clarify.  ° °YOU SHOULD EXPECT: Some feelings of bloating in the abdomen. Passage of more gas than usual. Walking can help get rid of the air that was put into your GI tract during the procedure and reduce the bloating. If you had a lower endoscopy (such as a colonoscopy or flexible sigmoidoscopy) you may notice spotting of blood in your stool or on the toilet paper. Some abdominal soreness may be present for a day or two, also. ° °DIET: Your first meal following the procedure should be a light meal and then it is ok to progress to your normal diet. A half-sandwich or bowl of soup is an example of a good first meal. Heavy or fried foods are harder to digest and may make you feel nauseous or bloated. Drink plenty of fluids but you should avoid alcoholic beverages for 24 hours. If you had a esophageal dilation, please see attached instructions for diet.   ° °ACTIVITY: Your care partner should take you home directly after the procedure. You should plan to take it easy, moving slowly for the rest of the day. You can resume normal activity the day after the procedure however YOU SHOULD NOT DRIVE, use power tools, machinery or perform tasks that involve climbing or major physical exertion for 24 hours (because of the sedation medicines used during the test).  ° °SYMPTOMS TO REPORT IMMEDIATELY: °A gastroenterologist can be reached at any hour. Please call [phone]  for any of the following symptoms:  °Following lower endoscopy (colonoscopy, flexible sigmoidoscopy) °Excessive amounts of blood in the stool  °Significant tenderness, worsening of abdominal pains  °Swelling of the abdomen that is new, acute  °Fever of 100° or  higher  °Following upper endoscopy (EGD, EUS, ERCP, esophageal dilation) °Vomiting of blood or coffee ground material  °New, significant abdominal pain  °New, significant chest pain or pain under the shoulder blades  °Painful or persistently difficult swallowing  °New shortness of breath  °Black, tarry-looking or red, bloody stools ° °FOLLOW UP:  °If any biopsies were taken you will be contacted by phone or by letter within the next 1-3 weeks. Call [phone]  if you have not heard about the biopsies in 3 weeks.  °Please also call with any specific questions about appointments or follow up tests. ° °

## 2021-11-06 LAB — SURGICAL PATHOLOGY

## 2021-11-07 ENCOUNTER — Encounter (HOSPITAL_COMMUNITY): Payer: Self-pay | Admitting: Gastroenterology

## 2021-11-08 ENCOUNTER — Encounter: Payer: Self-pay | Admitting: Gastroenterology

## 2021-12-05 ENCOUNTER — Ambulatory Visit: Payer: Medicare PPO | Attending: Family Medicine | Admitting: Physical Therapy

## 2021-12-05 ENCOUNTER — Encounter: Payer: Self-pay | Admitting: Physical Therapy

## 2021-12-05 DIAGNOSIS — M5459 Other low back pain: Secondary | ICD-10-CM | POA: Insufficient documentation

## 2021-12-05 DIAGNOSIS — M6281 Muscle weakness (generalized): Secondary | ICD-10-CM | POA: Diagnosis present

## 2021-12-05 NOTE — Therapy (Signed)
OUTPATIENT PHYSICAL THERAPY THORACOLUMBAR EVALUATION   Patient Name: Martin Adkins MRN: [id number] DOB:1945/08/08, 76 y.o., male Today's Date: 12/06/2021  END OF SESSION:  PT End of Session - 12/05/21 1147     Visit Number 1    Date for PT Re-Evaluation 02/02/21    Authorization - Visit Number 1    Progress Note Due on Visit 10    PT Start Time 1050    PT Stop Time 1133    PT Time Calculation (min) 43 min    Activity Tolerance Patient tolerated treatment well    Behavior During Therapy [facility] for tasks assessed/performed             Past Medical History:  Diagnosis Date   AAA (abdominal aortic aneurysm) without rupture ([place])    a.) [place] 02/09/04 - 3.1 x 3.4 cm. b.) [place] 02/14/05 - 3.62 x 3.63 cm. c.) [place] 02/25/06 - 3.97 x 4.0 cm. d.) CT 07/09/11 - 4.9 cm. e.) s/p EVAR 2013. f.) enlarging AAA --> back to the OR on 09/24/2017 for placement of a 27 mm x 12 cm RIGHT iliac extension limb down to distal CIA.   Aortic atherosclerosis (HCC)    APS (antiphospholipid syndrome) (HCC)    Arthritis    Atrial fibrillation (HCC)    a.) CHA2DS2-VASc Score = 6 (age, CHF, HTN, prior DVT x 2, prior MI). b.) on rivaroxaban   B12 deficiency 03/21/2017   Barrett's esophagus    BPH (benign prostatic hyperplasia)    CAD (coronary artery disease)    a.) LHC 11/14/95 --> 95% mLCx, 95% OM1; PTCA. b.) 4v CABG 2004 --> LIMA-LAD, SVG-D1,RCA,OM. c.) NSTEMI 05/02/2013 --> PCI with DES x 3 to SVG-OM in 2 distinct areas with filter. d.) Inf STEMI 07/30/14 - LHC --> 50% LM, 90% mLCx, 100% OM2, 100% RPDA; LIMA-LAD patent; 100% occ of SVG OM2,OM3,PDA; med mgmt. e.) LHC 01/19/18 - patent LIMA-LAD; other grafts occ; not amenable to PCI/further bypass.   Cardiomyopathy ([place])    Chronic anticoagulation    a.) Rivaroxaban   CLL (chronic lymphocytic leukemia) ([place]) 05/24/2017   DVT (deep venous thrombosis) (HCC)    GERD (gastroesophageal reflux disease)    HFrEF (heart failure with reduced ejection fraction) ([place])     a.) TTE 02/22/2020 --> mod. LV systolic dysfunction; LVEF 35-40%.   History of hiatal hernia    History of shingles 2013   Hyperlipemia    Hyperplastic polyps of stomach    a.) Bx on 07/24/2020 --> well differentiated NET; WHO grade I.   Hypertension    NSTEMI (non-ST elevated myocardial infarction) ([place]) 05/02/2013   a.) LHC 05/03/2013 --> 50% LM, 100% LAD, 75% mLCx, 100% dLCx, 60% mRCA; patient LIMA-LAD, occluded SVG-D1 and SVG-PDA. SVG-OM with extensive thrombus and distal 99% lesion. HIGH RISK PCI performed at [facility] with placement of DES x 3 to 2 distinct areas of SVG with filter protection.   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 No intervention; medical management.   Valvular regurgitation    a.) TTE 02/22/2020 --> LVEF 35-40%; mild panvalvular.   Past Surgical History:  Procedure Laterality Date   ABDOMINAL AORTA STENT     BIOPSY  11/05/2021   Procedure: BIOPSY;  Surgeon: Irving Copas., MD;  Location: [facility];  Service: Gastroenterology;;   CHOLECYSTECTOMY     COLONOSCOPY  11/24/2009   COLONOSCOPY N/A 10/09/2020   Procedure: COLONOSCOPY;  Surgeon: Irving Copas., MD;  Location: [facility];  Service: Gastroenterology;  Laterality: N/A;   COLONOSCOPY WITH PROPOFOL N/A 07/24/2020   Procedure: COLONOSCOPY WITH PROPOFOL;  Surgeon: Toledo, Benay Pike, MD;  Location: [facility]  ENDOSCOPY;  Service: Gastroenterology;  Laterality: N/A;   CORONARY ANGIOPLASTY WITH STENT PLACEMENT Left 05/03/2013   Procedure: CORONARY ANGIOPLASTY WITH STENT PLACEMENT (DES x 3 to SVG-OM graft); Location: [facility]   CORONARY ARTERY BYPASS GRAFT N/A 01/21/2002   Procedure: 4V CABG (LIMA-LAD, SVG-D1, SVG-RCA, SVG-OM); Location: [facility]   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   Procedure: DUPUYTREN CONTRACTURE RELEASE LEFT LONG FINGER;  Surgeon: Corky Mull, MD;  Location: [facility];  Service: Orthopedics;  Laterality: Left;   DUPUYTREN CONTRACTURE RELEASE Right 10/18/2020   Procedure: DUPUYTREN CONTRACTURE RELEASE;  Surgeon: Corky Mull, MD;  Location: [facility];  Service: Orthopedics;  Laterality: Right;   ENDOVASCULAR REPAIR/STENT GRAFT N/A 09/24/2017   Procedure: ENDOVASCULAR REPAIR/STENT GRAFT (27 mm x 12 cm RIGHT iliac limb extension to distal CIA);  Surgeon: Algernon Huxley, MD;  Location: [facility];  Service: Cardiovascular;  Laterality: N/A;   ESOPHAGOGASTRODUODENOSCOPY  05/26/02  03/23/12   ESOPHAGOGASTRODUODENOSCOPY (EGD) WITH PROPOFOL N/A 10/28/2016   Procedure: ESOPHAGOGASTRODUODENOSCOPY (EGD) WITH PROPOFOL;  Surgeon: Manya Silvas, MD;  Location: [facility] ENDOSCOPY;  Service: Endoscopy;  Laterality: N/A;   ESOPHAGOGASTRODUODENOSCOPY (EGD) WITH PROPOFOL N/A 11/05/2021   Procedure: ESOPHAGOGASTRODUODENOSCOPY (EGD) WITH PROPOFOL;  Surgeon: Rush Landmark Telford Nab., MD;  Location: [facility];  Service: Gastroenterology;  Laterality: N/A;   EUS N/A 10/09/2020   Procedure: LOWER ENDOSCOPIC ULTRASOUND (EUS);  Surgeon: Irving Copas., MD;  Location: [facility];  Service: Gastroenterology;  Laterality: N/A;   EUS N/A 11/05/2021   Procedure: LOWER ENDOSCOPIC ULTRASOUND (EUS);  Surgeon: Irving Copas., MD;  Location: [facility];  Service: Gastroenterology;  Laterality: N/A;   FLEXIBLE SIGMOIDOSCOPY N/A 11/05/2021   Procedure: FLEXIBLE SIGMOIDOSCOPY;  Surgeon:  Rush Landmark Telford Nab., MD;  Location: [facility];  Service: Gastroenterology;  Laterality: N/A;   HEMOSTASIS CLIP PLACEMENT  11/05/2021   Procedure: HEMOSTASIS CLIP PLACEMENT;  Surgeon: Irving Copas., MD;  Location: [facility];  Service: Gastroenterology;;   INCISION AND DRAINAGE ABSCESS N/A 06/14/2020   Procedure: INCISION AND DRAINAGE ABSCESS;  Surgeon: Herbert Pun, MD;  Location: [facility];  Service: General;  Laterality: N/A;   INGUINAL HERNIA REPAIR Left    LEFT HEART CATH AND CORONARY ANGIOGRAPHY N/A 01/19/2018   Procedure: LEFT HEART CATH AND CORONARY ANGIOGRAPHY;  Surgeon: Teodoro Spray, MD;  Location: [place] [facility];  Service: Cardiovascular;  Laterality: N/A;   LEFT HEART CATH AND CORONARY ANGIOGRAPHY Left 11/14/1995   Procedure: CARDIAC CATHETERIZATION (PTCA); Location: [facility]; Surgeon: Doristine Bosworth, MD   LEFT HEART CATH AND CORS/GRAFTS ANGIOGRAPHY Left 07/30/2014   Procedure: CARDIAC CATHETERIZATION; Location: [facility]   PERIPHERAL VASCULAR CATHETERIZATION N/A 09/06/2014   Procedure: IVC Filter  Removal;  Surgeon: Katha Cabal, MD;  Location: [place] [facility];  Service: Cardiovascular;  Laterality: N/A;   POLYPECTOMY  10/09/2020   Procedure: POLYPECTOMY;  Surgeon: Rush Landmark Telford Nab., MD;  Location: [facility];  Service: Gastroenterology;;   POLYPECTOMY  11/05/2021   Procedure: POLYPECTOMY;  Surgeon: Irving Copas., MD;  Location: [facility];  Service: Gastroenterology;;   [place] INJECTION  10/09/2020   Procedure: SUBMUCOSAL LIFTING INJECTION;  Surgeon: Irving Copas., MD;  Location: [facility];  Service: Gastroenterology;;   [place] INJECTION  11/05/2021   Procedure: SUBMUCOSAL TATTOO INJECTION;  Surgeon: Irving Copas., MD;  Location: [facility];  Service: Gastroenterology;;   TRANSURETHRAL RESECTION OF PROSTATE N/A 02/20/2015   Procedure: TRANSURETHRAL RESECTION OF THE PROSTATE  (TURP);  Surgeon: Hollice Espy, MD;  Location: [facility];  Service: Urology;  Laterality: N/A;   Patient Active Problem List   Diagnosis Date Noted   Carotid stenosis 09/26/2020   Primary neuroendocrine carcinoma of rectum ([place]) 08/22/2020   Dupuytren's contracture of left hand 11/02/2018   Sepsis ([place]) 08/27/2018   Non-ST elevation MI (NSTEMI) ([place]) 01/18/2018   AAA (abdominal aortic aneurysm) ([place]) 09/24/2017   Leaking abdominal aortic aneurysm (AAA) ([place]) 08/22/2017   CLL (chronic lymphocytic leukemia) ([place]) 05/24/2017   Goals of care, counseling/discussion 05/24/2017   B12 deficiency 03/21/2017   Elevated troponin I level 03/03/2015   Deep venous thrombosis of profunda femoris vein ([place]) 01/23/2015   BPH (benign prostatic hyperplasia) 01/23/2015   Urge urinary incontinence 01/23/2015   Type 2 diabetes mellitus ([place]) 10/31/2014   Benign gastric polyp 10/31/2014   Absence of bladder continence 10/09/2014   Acute non-ST elevation myocardial infarction (NSTEMI) ([place]) 08/15/2014   Atrial fibrillation ([place]) 08/05/2014   Antiphospholipid syndrome ([place]) 08/01/2014   H/O deep venous thrombosis 08/01/2014   Non-ST elevation myocardial infarction (NSTEMI), subendocardial infarction, subsequent episode of care ([place]) 07/30/2014   Barrett esophagus 07/28/2014   Diabetes mellitus, type 2 ([place]) 07/28/2014   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/28/2014   Hyperplastic adenomatous polyp of stomach 07/28/2014   Benign essential HTN 54/49/2010   Chronic systolic heart failure ([place]) 04/19/2014   AAA (abdominal aortic aneurysm) without rupture ([place]) 04/05/2014   Arteriosclerosis of coronary artery 04/05/2014   Breathlessness on exertion 04/05/2014   Abdominal aortic aneurysm (AAA) without rupture ([place]) 04/05/2014   Acute non-ST segment elevation myocardial infarction ([place]) 05/14/2013   Acid reflux 05/04/2013   Combined fat and carbohydrate induced hyperlipemia 05/03/2013   Obstructive apnea  05/03/2013   Arthritis, degenerative 05/03/2013   Peripheral blood vessel disorder ([place]) 05/03/2013   Arthritis or polyarthritis, rheumatoid ([place]) 05/03/2013   Compulsive tobacco user syndrome 05/03/2013   Rheumatoid arthritis ([place]) 05/03/2013   Peripheral vascular disease ([place]) 05/03/2013   Current tobacco use 05/03/2013    PCP: [name], MD   REFERRING PROVIDER: Meeler, Sherren Kerns, FNP   REFERRING DIAG: DDD Lumbar radiculitis  Rationale for Evaluation and Treatment: Rehabilitation  THERAPY DIAG:  Other low back pain  Muscle weakness (generalized)  ONSET DATE: 4 years ago  SUBJECTIVE:  SUBJECTIVE STATEMENT: Chronic LBP with bilateral LE involvement  PERTINENT HISTORY:  Patient is a 76 year old male presenting with low back pain. Pt reports symptoms initiated about 4 years ago and has been receiving injections for over a year now every three months. Pt is retired and lives alone. Pt reports low back pain all the time with the occasional radiating symptoms into BLE with the left being more symptomatic. Radiating pain does not pass the knee, however occasional tingling sensation of both feet is reported- independent of radiating lower extremity pain. Pt reports pain in low back is aggravated with standing, walking, squatting, and doing activities on knees. Pt also reports concerns for balance not being good although he denies any recent falls. Pt voices willingness to comply with physical therapy to see if there is anything we can do to help with his symptoms but understands he is aging.   PAIN:  Are you having pain? Yes: NPRS scale: 6/10 Pain location: lumbar spine, bilateral LE stopping at the knees Pain description: burning, radiates down legs worse on left  Aggravating factors: walking,  bending over, squatting, getting on knees Relieving factors: resting, laying down, pain medications  PRECAUTIONS: Fall  WEIGHT BEARING RESTRICTIONS: No  FALLS:  Has patient fallen in last 6 months? No  LIVING ENVIRONMENT: Lives with: lives alone Lives in: House/apartment Stairs: Yes: Internal: 13 steps;   and External: 2 steps;   Has following equipment at home: None  OCCUPATION: Retired  PLOF: Independent and Independent with basic ADLs  PATIENT GOALS: Pain reduction, improve functional mobility, improve balance  NEXT MD VISIT:   OBJECTIVE:   DIAGNOSTIC FINDINGS:  CLINICAL DATA:  Intermittent low back pain with bilateral radiculopathy, left greater than right   EXAM: MRI LUMBAR SPINE WITHOUT CONTRAST   TECHNIQUE: Multiplanar, multisequence MR imaging of the lumbar spine was performed. No intravenous contrast was administered.   COMPARISON:  CT 01/18/2018   FINDINGS: Segmentation:  Standard.   Alignment:  Physiologic.   Vertebrae:  No fracture, evidence of discitis, or bone lesion.   Conus medullaris and cauda equina: Conus extends to the L1 level. Conus and cauda equina appear normal.   Paraspinal and other soft tissues: Partially visualized abdominal aortic aneurysm which is status post endovascular repair, grossly stable compared to prior CT.   Disc levels:   T12-L1: No significant disc protrusion, foraminal stenosis, or canal stenosis.   L1-L2: No significant disc protrusion, foraminal stenosis, or canal stenosis.   L2-L3: No significant disc protrusion, foraminal stenosis, or canal stenosis.   L3-L4: Mild diffuse disc bulge and mild bilateral facet arthrosis. There is mild left foraminal stenosis. No canal stenosis.   L4-L5: Mild diffuse disc bulge and mild bilateral facet arthrosis. Mild left foraminal stenosis. No canal stenosis.   L5-S1: Mild diffuse disc bulge, slightly eccentric to the left and mild bilateral facet arthrosis. There is  mild left foraminal stenosis. No canal stenosis.   IMPRESSION: 1. Mild lower lumbar spondylosis with mild narrowing of the left neural foramina at L3-4, L4-5, and L5-S1. 2. Known abdominal aortic aneurysm status post endovascular repair, poorly characterized.  PATIENT SURVEYS:  FOTO 51/57   COGNITION: Overall cognitive status: Within functional limits for tasks assessed     SENSATION: WFL  MUSCLE LENGTH: Hamstrings: Right 126 deg; Left 117 deg   POSTURE: forward head and decreased lumbar lordosis  PALPATION: Lumbar Spine: CPA's from T12-L5 tender; stiffness noted UPA's on L4-L5 left tender; stiffness noted  Hamstrings: Very tight during length  testing as well as during SLR   LUMBAR ROM:   AROM eval  Flexion WFL, pain  Extension WFL  Right lateral flexion WFL, pain with OP  Left lateral flexion WFL  Right rotation WFL  Left rotation WFL   (Blank rows = not tested)  LOWER EXTREMITY ROM:     Active  Right eval Left eval  Hip flexion WFL pain with OP WFL pain with OP  Hip extension 25% limited ilat  Hip abduction    Hip adduction    Hip internal rotation 50% limited  Hip external rotation    Knee flexion    Knee extension    Ankle dorsiflexion    Ankle plantarflexion    Ankle inversion    Ankle eversion     (Blank rows = not tested)  LOWER EXTREMITY MMT:    MMT Right eval Left eval  Hip flexion 4 4  Hip extension 3 3  Hip abduction 4 4  Hip adduction 4 4  Hip internal rotation    Hip external rotation    Knee flexion    Knee extension    Ankle dorsiflexion    Ankle plantarflexion    Ankle inversion    Ankle eversion     (Blank rows = not tested)  LUMBAR SPECIAL TESTS:  Thomas test Positive bilat; SLR positive LLE only; Slump test: positive on RLE for radicular pain- patient reports "pain not n/t"; Quadrant test postiive R hip   FUNCTIONAL TESTS:  5 times sit to stand: 23.31 seconds 10 meter walk test: self selected speed: 12.53  seconds (0.80 m/s); fastest speed: 9.55 seconds 1.04 m/s)  GAIT: Distance walked: 75 feet for gait observation Assistive device utilized: None Level of assistance: Complete Independence Comments: decreased gait speed; decreased hip ext throughout; forward flexed throughout  Today's Treatment Today's treatment consisted of an examination of the patients lumbar spine, hip mobility, and bilateral LE strength.  Pt was also instructed in therapeutic exercise prescribed for HEP. 50 feet of walking for gait observation and gait speed evaluation Stair climbing 3 sets of 4 stairs for stair negotiation observation Sit to stands, 1 set 5 reps, pt cued for making contact with backrest of chair each rep  PT reviewed the following HEP with patient with patient able to demonstrate a set of the following with min cuing for correction needed. PT educated patient on parameters of therex (how/when to inc/decrease intensity, frequency, rep/set range, stretch hold time, and purpose of therex) with verbalized understanding.   Standing Lumbar Extensions 12-20 reps, pt cued for hand placement Supine hip flexor stretch, 30 second hold 1 rep, pt cued for relaxation of extended LE Supine or seated Hamstring stretch with strap, 30 second hold, 1 rep bilaterally, pt cued to reach slight discomfort   PATIENT EDUCATION:  Education details: Patient was educated on diagnosis, anatomy and pathology involved, prognosis, role of PT, and was given an HEP, demonstrating exercise with proper form following verbal and tactile cues, and was given a paper hand out to continue exercise at home. Pt was educated on and agreed to plan of care.  Person educated: Patient Education method: Consulting civil engineer, Demonstration, Verbal cues, and Handouts Education comprehension: verbalized understanding, returned demonstration, and verbal cues required  HOME EXERCISE PROGRAM: Standing Lumbar Extensions 12-20 reps, once every hour daily Supine hip  flexor stretch, 30 second holds, 2 reps, 3 times per day Supine or seated Hamstring stretch, 30 second holds, 2 reps, 3 times per day  ASSESSMENT:  CLINICAL  IMPRESSION: Patient is a 76 y.o. male who was seen today for physical therapy evaluation and treatment for low back pain with LE involvement. Presentation inconsistent with true lumbar rediculopathy (= L SLR, + RLE slump test, periphrealization of pain bilat LE with forward flexion, report of numbness and tingling of bilat feet beng disconnected from radicular BLE symptoms). Diagnosis most probably lumbar spondylosis, with possible rediculopathy (not observed in this evaluation).  Deficits are observed in bilateral LE with shortened hamstrings as well as weakness in hip flexion and extension. Objective impairments also include decreased gait speed, decreased activity tolerance, decreased endurance, kneeling, decreased mobility and pain. Activity limitations include bending, squatting, stair ambulation, and lifting. Participation limitations include indoor and outdoor ADLs and community activity. Patient will benefit from skilled physical therapy to improve bilateral LE strength and mobility, low back mobility and strength, and to decrease pain originating in low back for improved quality of life and increased independence with ADL and community ambulation.   OBJECTIVE IMPAIRMENTS: Abnormal gait, decreased activity tolerance, decreased balance, decreased endurance, decreased mobility, difficulty walking, decreased ROM, decreased strength, hypomobility, and pain.   ACTIVITY LIMITATIONS: carrying, lifting, bending, sitting, squatting, and stairs  PARTICIPATION LIMITATIONS: cleaning, community activity, and yard work  PERSONAL FACTORS: Age and Fitness are also affecting patient's functional outcome.   REHAB POTENTIAL: Good  CLINICAL DECISION MAKING: Evolving/moderate complexity  EVALUATION COMPLEXITY: Moderate   GOALS: Goals reviewed with  patient? No  SHORT TERM GOALS: Target date: 01/03/2023  Pt will be independent with HEP in order to improve strength and balance in order to decrease fall risk and improve function at home and community.  Baseline: 12/05/2021 HEP given Goal status: INITIAL    LONG TERM GOALS: Target date: 01/30/2021  Patient will increase FOTO score to 57 to demonstrate predicted increase in functional mobility to complete ADLs.  Baseline: 12/05/2021: 51 Goal status: INITIAL  2.  Patient will report a decrease in resting pain level to at least 3/10 pain for improved QoL. Baseline: 12/05/2021 Goal status: INITIAL  3.  Patient will increase self selected gait speed to 1.72ms for improved safety and independence during community ambulation. Baseline: 12/06/2021: 0.868m Goal status: INITIAL  4.  Pt will improve 5TSTS time to at least 19 seconds for increased bilateral LE strength and decreased fall risk. Baseline: 12/06/2021: 23.31 seconds Goal status: INITIAL   PLAN:  PT FREQUENCY: 1-2x/week  PT DURATION: 8 weeks  PLANNED INTERVENTIONS: Therapeutic exercises, Therapeutic activity, Neuromuscular re-education, Balance training, Gait training, Patient/Family education, Self Care, Joint mobilization, and Manual therapy.  PLAN FOR NEXT SESSION: FGA?, SLS time, STS for HEP  ChDurwin RegesPT ChDurwin RegesPT 12/06/2021, 2:40 PM

## 2021-12-10 ENCOUNTER — Ambulatory Visit: Payer: Medicare PPO | Admitting: Physical Therapy

## 2021-12-10 ENCOUNTER — Ambulatory Visit: Payer: Medicare PPO | Attending: Family Medicine

## 2021-12-10 DIAGNOSIS — M5459 Other low back pain: Secondary | ICD-10-CM | POA: Insufficient documentation

## 2021-12-10 DIAGNOSIS — M6281 Muscle weakness (generalized): Secondary | ICD-10-CM | POA: Diagnosis present

## 2021-12-10 NOTE — Therapy (Addendum)
OUTPATIENT PHYSICAL THERAPY THORACOLUMBAR TREATMENT   Patient Name: Martin Adkins MRN: [id number] DOB:January 18, 1945, 76 y.o., male Today's Date: 12/10/2021  END OF SESSION:  PT End of Session - 12/10/21 1001     Visit Number 2    Number of Visits 24    Date for PT Re-Evaluation 02/02/21    Authorization Type [employer] PPO: cert for 72/53/66-4/40/34    Authorization Time Period 12 visits 12/10/21-02/06/22    Authorization - Visit Number 1    Authorization - Number of Visits 12    Progress Note Due on Visit 10    PT Start Time 1002    PT Stop Time 1046    PT Time Calculation (min) 44 min    Equipment Utilized During Treatment Gait belt    Activity Tolerance Patient tolerated treatment well    Behavior During Therapy WFL for tasks assessed/performed              Past Medical History:  Diagnosis Date   AAA (abdominal aortic aneurysm) without rupture ([place])    a.) [place] 02/09/04 - 3.1 x 3.4 cm. b.) [place] 02/14/05 - 3.62 x 3.63 cm. c.) [place] 02/25/06 - 3.97 x 4.0 cm. d.) CT 07/09/11 - 4.9 cm. e.) s/p EVAR 2013. f.) enlarging AAA --> back to the OR on 09/24/2017 for placement of a 27 mm x 12 cm RIGHT iliac extension limb down to distal CIA.   Aortic atherosclerosis (HCC)    APS (antiphospholipid syndrome) (HCC)    Arthritis    Atrial fibrillation (HCC)    a.) CHA2DS2-VASc Score = 6 (age, CHF, HTN, prior DVT x 2, prior MI). b.) on rivaroxaban   B12 deficiency 03/21/2017   Barrett's esophagus    BPH (benign prostatic hyperplasia)    CAD (coronary artery disease)    a.) LHC 11/14/95 --> 95% mLCx, 95% OM1; PTCA. b.) 4v CABG 2004 --> LIMA-LAD, SVG-D1,RCA,OM. c.) NSTEMI 05/02/2013 --> PCI with DES x 3 to SVG-OM in 2 distinct areas with filter. d.) Inf STEMI 07/30/14 - LHC --> 50% LM, 90% mLCx, 100% OM2, 100% RPDA; LIMA-LAD patent; 100% occ of SVG OM2,OM3,PDA; med mgmt. e.) LHC 01/19/18 - patent LIMA-LAD; other grafts occ; not amenable to PCI/further bypass.   Cardiomyopathy ([place])     Chronic anticoagulation    a.) Rivaroxaban   CLL (chronic lymphocytic leukemia) ([place]) 05/24/2017   DVT (deep venous thrombosis) (HCC)    GERD (gastroesophageal reflux disease)    HFrEF (heart failure with reduced ejection fraction) ([place])    a.) TTE 02/22/2020 --> mod. LV systolic dysfunction; LVEF 35-40%.   History of hiatal hernia    History of shingles 2013   Hyperlipemia    Hyperplastic polyps of stomach    a.) Bx on 07/24/2020 --> well differentiated NET; WHO grade I.   Hypertension    NSTEMI (non-ST elevated myocardial infarction) ([place]) 05/02/2013   a.) LHC 05/03/2013 --> 50% LM, 100% LAD, 75% mLCx, 100% dLCx, 60% mRCA; patient LIMA-LAD, occluded SVG-D1 and SVG-PDA. SVG-OM with extensive thrombus and distal 99% lesion. HIGH RISK PCI performed at [facility] with placement of DES x 3 to 2 distinct areas of SVG with filter protection.   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 No intervention; medical management.   Valvular regurgitation    a.) TTE 02/22/2020 --> LVEF 35-40%; mild panvalvular.   Past Surgical History:  Procedure Laterality Date   ABDOMINAL AORTA STENT     BIOPSY  11/05/2021   Procedure: BIOPSY;  Surgeon: Irving Copas., MD;  Location: [facility];  Service: Gastroenterology;;   CHOLECYSTECTOMY     COLONOSCOPY  11/24/2009   COLONOSCOPY N/A 10/09/2020   Procedure: COLONOSCOPY;   Surgeon: Irving Copas., MD;  Location: [facility];  Service: Gastroenterology;  Laterality: N/A;   COLONOSCOPY WITH PROPOFOL N/A 07/24/2020   Procedure: COLONOSCOPY WITH PROPOFOL;  Surgeon: Toledo, Benay Pike, MD;  Location: [facility];  Service: Gastroenterology;  Laterality: N/A;   CORONARY ANGIOPLASTY WITH STENT PLACEMENT Left 05/03/2013   Procedure: CORONARY ANGIOPLASTY WITH STENT PLACEMENT (DES x 3 to SVG-OM graft); Location: [facility]   CORONARY ARTERY BYPASS GRAFT N/A 01/21/2002   Procedure: 4V CABG (LIMA-LAD, SVG-D1, SVG-RCA, SVG-OM); Location: [facility]   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   Procedure: DUPUYTREN CONTRACTURE RELEASE LEFT LONG FINGER;  Surgeon: Corky Mull, MD;  Location: [facility];  Service: Orthopedics;  Laterality: Left;   DUPUYTREN CONTRACTURE RELEASE Right 10/18/2020   Procedure: DUPUYTREN CONTRACTURE RELEASE;  Surgeon: Corky Mull, MD;  Location: [facility];  Service: Orthopedics;  Laterality: Right;   ENDOVASCULAR REPAIR/STENT GRAFT N/A 09/24/2017   Procedure: ENDOVASCULAR REPAIR/STENT GRAFT (27 mm x 12 cm RIGHT iliac limb extension to distal CIA);  Surgeon: Algernon Huxley, MD;  Location: [place] [facility];  Service: Cardiovascular;  Laterality: N/A;   ESOPHAGOGASTRODUODENOSCOPY  05/26/02  03/23/12   ESOPHAGOGASTRODUODENOSCOPY (EGD) WITH PROPOFOL N/A 10/28/2016   Procedure: ESOPHAGOGASTRODUODENOSCOPY (EGD) WITH PROPOFOL;  Surgeon: Manya Silvas, MD;  [facility];  Service: Endoscopy;  Laterality: N/A;   ESOPHAGOGASTRODUODENOSCOPY (EGD) WITH PROPOFOL N/A 11/05/2021   Procedure: ESOPHAGOGASTRODUODENOSCOPY (EGD) WITH PROPOFOL;  Surgeon: Rush Landmark Telford Nab., MD;  Location: [facility];  Service: Gastroenterology;  Laterality: N/A;   EUS N/A 10/09/2020   Procedure: LOWER ENDOSCOPIC ULTRASOUND (EUS);  Surgeon: Irving Copas., MD;  Location: [facility];  Service: Gastroenterology;  Laterality: N/A;   EUS N/A 11/05/2021    Procedure: LOWER ENDOSCOPIC ULTRASOUND (EUS);  Surgeon: Irving Copas., MD;  Location: [facility];  Service: Gastroenterology;  Laterality: N/A;   FLEXIBLE SIGMOIDOSCOPY N/A 11/05/2021   Procedure: FLEXIBLE SIGMOIDOSCOPY;  Surgeon: Rush Landmark Telford Nab., MD;  Location: [facility];  Service: Gastroenterology;  Laterality: N/A;   HEMOSTASIS CLIP PLACEMENT  11/05/2021   Procedure: HEMOSTASIS CLIP PLACEMENT;  Surgeon: Irving Copas., MD;  Location: [facility];  Service: Gastroenterology;;   INCISION AND DRAINAGE ABSCESS N/A 06/14/2020   Procedure: INCISION AND DRAINAGE ABSCESS;  Surgeon: Herbert Pun, MD;  Location: [facility];  Service: General;  Laterality: N/A;   INGUINAL HERNIA REPAIR Left    LEFT HEART CATH AND CORONARY ANGIOGRAPHY N/A 01/19/2018   Procedure: LEFT HEART CATH AND CORONARY ANGIOGRAPHY;  Surgeon: Teodoro Spray, MD;  Location: [place] [facility];  Service: Cardiovascular;  Laterality: N/A;   LEFT HEART  CATH AND CORONARY ANGIOGRAPHY Left 11/14/1995   Procedure: CARDIAC CATHETERIZATION (PTCA); Location: [facility]; Surgeon: Doristine Bosworth, MD   LEFT HEART CATH AND CORS/GRAFTS ANGIOGRAPHY Left 07/30/2014   Procedure: CARDIAC CATHETERIZATION; Location: [facility]   PERIPHERAL VASCULAR CATHETERIZATION N/A 09/06/2014   Procedure: IVC Filter Removal;  Surgeon: Katha Cabal, MD;  Location: [place] [facility];  Service: Cardiovascular;  Laterality: N/A;   POLYPECTOMY  10/09/2020   Procedure: POLYPECTOMY;  Surgeon: Rush Landmark Telford Nab., MD;  Location: [facility];  Service: Gastroenterology;;   POLYPECTOMY  11/05/2021   Procedure: POLYPECTOMY;  Surgeon: Irving Copas., MD;  Location: [facility];  Service: Gastroenterology;;   [place] INJECTION  10/09/2020   Procedure: SUBMUCOSAL LIFTING INJECTION;  Surgeon: Irving Copas., MD;  Location: [facility];  Service: Gastroenterology;;   [place] INJECTION  11/05/2021    Procedure: SUBMUCOSAL TATTOO INJECTION;  Surgeon: Irving Copas., MD;  Location: [facility];  Service: Gastroenterology;;   TRANSURETHRAL RESECTION OF PROSTATE N/A 02/20/2015   Procedure: TRANSURETHRAL RESECTION OF THE PROSTATE (TURP);  Surgeon: Hollice Espy, MD;  Location: [facility];  Service: Urology;  Laterality: N/A;   Patient Active Problem List   Diagnosis Date Noted   Carotid stenosis 09/26/2020   Primary neuroendocrine carcinoma of rectum ([place]) 08/22/2020   Dupuytren's contracture of left hand 11/02/2018   Sepsis ([place]) 08/27/2018   Non-ST elevation MI (NSTEMI) ([place]) 01/18/2018   AAA (abdominal aortic aneurysm) ([place]) 09/24/2017   Leaking abdominal aortic aneurysm (AAA) ([place]) 08/22/2017   CLL (chronic lymphocytic leukemia) ([place]) 05/24/2017   Goals of care, counseling/discussion 05/24/2017   B12 deficiency 03/21/2017   Elevated troponin I level 03/03/2015   Deep venous thrombosis of profunda femoris vein ([place]) 01/23/2015   BPH (benign prostatic hyperplasia) 01/23/2015   Urge urinary incontinence 01/23/2015   Type 2 diabetes mellitus ([place]) 10/31/2014   Benign gastric polyp 10/31/2014   Absence of bladder continence 10/09/2014   Acute non-ST elevation myocardial infarction (NSTEMI) ([place]) 08/15/2014   Atrial fibrillation ([place]) 08/05/2014   Antiphospholipid syndrome ([place]) 08/01/2014   H/O deep venous thrombosis 08/01/2014   Non-ST elevation myocardial infarction (NSTEMI), subendocardial infarction, subsequent episode of care ([place]) 07/30/2014   Barrett esophagus 07/28/2014   Diabetes mellitus, type 2 ([place]) 07/28/2014   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/28/2014   Hyperplastic adenomatous polyp of stomach 07/28/2014   Benign essential HTN 26/33/3545   Chronic systolic heart failure ([place]) 04/19/2014   AAA (abdominal aortic aneurysm) without rupture ([place]) 04/05/2014   Arteriosclerosis of coronary artery 04/05/2014   Breathlessness on exertion 04/05/2014   Abdominal  aortic aneurysm (AAA) without rupture ([place]) 04/05/2014   Acute non-ST segment elevation myocardial infarction ([place]) 05/14/2013   Acid reflux 05/04/2013   Combined fat and carbohydrate induced hyperlipemia 05/03/2013   Obstructive apnea 05/03/2013   Arthritis, degenerative 05/03/2013   Peripheral blood vessel disorder ([place]) 05/03/2013   Arthritis or polyarthritis, rheumatoid ([place]) 05/03/2013   Compulsive tobacco user syndrome 05/03/2013   Rheumatoid arthritis ([place]) 05/03/2013   Peripheral vascular disease ([place]) 05/03/2013   Current tobacco use 05/03/2013   PCP: [name], MD REFERRING PROVIDER: Meeler, Sherren Kerns, FNP REFERRING DIAG: DDD Lumbar radiculitis Rationale for Evaluation and Treatment: Rehabilitation THERAPY DIAG:  Other low back pain  Muscle weakness (generalized) ONSET DATE: 4 years ago SUBJECTIVE:  SUBJECTIVE STATEMENT: Pt reports complying with HEP three days last week after the initial evaluation. Pt reports feeling leg sensation during SLR specifically left leg is the worst. Pt reports taking a pain pill after pain from post exercise soreness. Pt reports being active out in the yard this weekend, push mowing back yard. Pt reports 7/10 pain this morning.  PERTINENT HISTORY:  Patient is a 76 year old male presenting with low back pain. Pt reports symptoms initiated about 4 years ago and has been receiving injections for over a year now every three months. Pt is retired and lives alone. Pt reports low back pain all the time with the occasional radiating symptoms into BLE with the left being more symptomatic. Radiating pain does not pass the knee, however occasional tingling sensation of both feet is reported- independent of radiating lower extremity pain. Pt reports pain in low back is  aggravated with standing, walking, squatting, and doing activities on knees. Pt also reports concerns for balance not being good although he denies any recent falls. Pt voices willingness to comply with physical therapy to see if there is anything we can do to help with his symptoms but understands he is aging.   PAIN:  Are you having pain? Yes: NPRS scale: 6/10 Pain location: lumbar spine, bilateral LE stopping at the knees Pain description: burning, radiates down legs worse on left  Aggravating factors: walking, bending over, squatting, getting on knees Relieving factors: resting, laying down, pain medications  PRECAUTIONS: Fall WEIGHT BEARING RESTRICTIONS: No FALLS:  Has patient fallen in last 6 months? No  LIVING ENVIRONMENT: Lives with: lives alone Lives in: House/apartment Stairs: Yes: Internal: 13 steps;   and External: 2 steps;   Has following equipment at home: None  OCCUPATION: Retired  PLOF: Independent and Independent with basic ADLs  PATIENT GOALS: Pain reduction, improve functional mobility, improve balance  OBJECTIVE:     12/10/21   Functional Gait  Assessment  Gait assessed  Yes  Gait Level Surface 1  Change in Gait Speed 2  Gait with Horizontal Head Turns 1  Gait with Vertical Head Turns 2  Gait and Pivot Turn 2  Step Over Obstacle 2  Gait with Narrow Base of Support 2  Gait with Eyes Closed 1  Ambulating Backwards 2  Steps 2  Total Score 17     DIAGNOSTIC FINDINGS:  CLINICAL DATA:  Intermittent low back pain with bilateral radiculopathy, left greater than right   EXAM: MRI LUMBAR SPINE WITHOUT CONTRAST    IMPRESSION: 1. Mild lower lumbar spondylosis with mild narrowing of the left neural foramina at L3-4, L4-5, and L5-S1. 2. Known abdominal aortic aneurysm status post endovascular repair, poorly characterized.  PATIENT SURVEYS:  FOTO 51/57  COGNITION: Overall cognitive status: Within functional limits for tasks  assessed   SENSATION: WFL  LUMBAR ROM:  AROM eval  Flexion WFL, pain  Extension WFL  Right lateral flexion WFL, pain with OP  Left lateral flexion WFL  Right rotation WFL  Left rotation WFL   (Blank rows = not tested)  LOWER EXTREMITY ROM:     Active  Right eval Left eval  Hip flexion WFL pain with OP WFL pain with OP  Hip extension 25% limited ilat  Hip internal rotation 50% limited   (Blank rows = not tested)  LOWER EXTREMITY MMT:    MMT Right eval Left eval  Hip flexion 4 4  Hip extension 3 3  Hip abduction 4 4  Hip adduction 4  4   (Blank rows = not tested)  FUNCTIONAL TESTS:  5 times sit to stand: 23.31 seconds 10 meter walk test: self selected speed: 12.53 seconds (0.80 m/s); fastest speed: 9.55 seconds 1.04 m/s)   Today's Treatment Treadmill, 1.5% incline, 2.0 mph, 5 minutes FGA, 233f gait ambulation, stair ambulation 2 reps of 4 steps KB Dead lifts, 20 pounds; pt cued to look up and for neutral spine using wider stance and to increase knee flexion Lumbar rotations 2 sets of 10 reps, pt cued to stop knees at the point back moves off of table Squat to elevated mat table 2 sets, 10 reps, pt cued for minimal reliance on resting weight on table between reps Straight leg raise 2 sets, 10 reps bilaterally; pt cued for hook lying position for opposite LE during lifts   PATIENT EDUCATION:  Education details: Patient was educated on diagnosis, anatomy and pathology involved, prognosis, role of PT, and was given an HEP, demonstrating exercise with proper form following verbal and tactile cues, and was given a paper hand out to continue exercise at home. Pt was educated on and agreed to plan of care.  Person educated: Patient Education method: EConsulting civil engineer Demonstration, Verbal cues, and Handouts Education comprehension: verbalized understanding, returned demonstration, and verbal cues required  HOME EXERCISE PROGRAM: Standing Lumbar Extensions 12-20 reps, once  every hour daily Supine hip flexor stretch, 30 second holds, 2 reps, 3 times per day Supine or seated Hamstring stretch, 30 second holds, 2 reps, 3 times per day  ASSESSMENT:  CLINICAL IMPRESSION: PT initiated therapeutic exercise to address deficits in core and bilateral LE strength. Patient completed FGA balance assessment score of 17/24 indicative of increased risk of falls. Pt demonstrated good effort during session and required minimal breaks between exercises. SLR exercise continues to provoke symptoms despite modification from supine to hooklying- educated pt on differentiating between exercise-related discomfort and nerve pain. Patient will continue to benefit from skilled physical therapy to decrease low back pain and address strength deficits for improved quality of life and improved tolerance to community ambulation.      OBJECTIVE IMPAIRMENTS: Abnormal gait, decreased activity tolerance, decreased balance, decreased endurance, decreased mobility, difficulty walking, decreased ROM, decreased strength, hypomobility, and pain.   ACTIVITY LIMITATIONS: carrying, lifting, bending, sitting, squatting, and stairs  PARTICIPATION LIMITATIONS: cleaning, community activity, and yard work  PERSONAL FACTORS: Age and Fitness are also affecting patient's functional outcome.   REHAB POTENTIAL: Good  CLINICAL DECISION MAKING: Evolving/moderate complexity  EVALUATION COMPLEXITY: Moderate   GOALS: Goals reviewed with patient? No  SHORT TERM GOALS: Target date: 01/03/2023  Pt will be independent with HEP in order to improve strength and balance in order to decrease fall risk and improve function at home and community.  Baseline: 12/05/2021 HEP given Goal status: INITIAL  LONG TERM GOALS: Target date: 01/30/2021  Patient will increase FOTO score to 57 to demonstrate predicted increase in functional mobility to complete ADLs.  Baseline: 12/05/2021: 51 Goal status: INITIAL  2.   Patient will report a decrease in resting pain level to at least 3/10 pain for improved QoL. Baseline: 12/05/2021 Goal status: INITIAL  3.  Patient will increase self selected gait speed to 1.143m for improved safety and independence during community ambulation. Baseline: 12/06/2021: 0.18m64mGoal status: INITIAL  4.  Pt will improve 5TSTS time to at least 19 seconds for increased bilateral LE strength and decreased fall risk. Baseline: 12/06/2021: 23.31 seconds Goal status: INITIAL PLAN:  PT FREQUENCY: 1-2x/week  PT DURATION:  8 weeks  PLANNED INTERVENTIONS: Therapeutic exercises, Therapeutic activity, Neuromuscular re-education, Balance training, Gait training, Patient/Family education, Self Care, Joint mobilization, and Manual therapy.  PLAN FOR NEXT SESSION:  SLS time, STS for HEP   1:43 PM, 12/10/21 Etta Grandchild, PT, DPT Physical Therapist - [place] [phone] (Office)    Mayo Ao, Student-PT 12/10/2021, 12:17 PM

## 2021-12-12 ENCOUNTER — Ambulatory Visit: Payer: Medicare PPO | Admitting: Physical Therapy

## 2021-12-12 ENCOUNTER — Encounter: Payer: Self-pay | Admitting: Physical Therapy

## 2021-12-12 DIAGNOSIS — M6281 Muscle weakness (generalized): Secondary | ICD-10-CM

## 2021-12-12 DIAGNOSIS — M5459 Other low back pain: Secondary | ICD-10-CM

## 2021-12-12 NOTE — Therapy (Signed)
OUTPATIENT PHYSICAL THERAPY THORACOLUMBAR TREATMENT   Patient Name: Martin Adkins MRN: [id number] DOB:[birth year], 76 y.o., male Today's Date: 12/12/2021  END OF SESSION:  PT End of Session - 12/12/21 0957     Visit Number 3    Number of Visits 24    Date for PT Re-Evaluation 02/02/21    Authorization Type [employer] Choice PPO: cert for 37/85/88-05/09/75    Authorization Time Period 12 visits 12/10/21-02/06/22    Authorization - Visit Number 3    Authorization - Number of Visits 12    Progress Note Due on Visit 10    PT Start Time 1000    PT Stop Time 4128    PT Time Calculation (min) 44 min    Equipment Utilized During Treatment --    Activity Tolerance Patient tolerated treatment well    Behavior During Therapy WFL for tasks assessed/performed               Past Medical History:  Diagnosis Date   AAA (abdominal aortic aneurysm) without rupture ([place])    a.) [place] 02/09/04 - 3.1 x 3.4 cm. b.) [place] 02/14/05 - 3.62 x 3.63 cm. c.) [place] 02/25/06 - 3.97 x 4.0 cm. d.) CT 07/09/11 - 4.9 cm. e.) s/p EVAR 2013. f.) enlarging AAA --> back to the OR on 09/24/2017 for placement of a 27 mm x 12 cm RIGHT iliac extension limb down to distal CIA.   Aortic atherosclerosis (HCC)    APS (antiphospholipid syndrome) (HCC)    Arthritis    Atrial fibrillation (HCC)    a.) CHA2DS2-VASc Score = 6 (age, CHF, HTN, prior DVT x 2, prior MI). b.) on rivaroxaban   B12 deficiency 03/21/2017   Barrett's esophagus    BPH (benign prostatic hyperplasia)    CAD (coronary artery disease)    a.) LHC 11/14/95 --> 95% mLCx, 95% OM1; PTCA. b.) 4v CABG 2004 --> LIMA-LAD, SVG-D1,RCA,OM. c.) NSTEMI 05/02/2013 --> PCI with DES x 3 to SVG-OM in 2 distinct areas with filter. d.) Inf STEMI 07/30/14 - LHC --> 50% LM, 90% mLCx, 100% OM2, 100% RPDA; LIMA-LAD patent; 100% occ of SVG OM2,OM3,PDA; med mgmt. e.) LHC 01/19/18 - patent LIMA-LAD; other grafts occ; not amenable to PCI/further bypass.   Cardiomyopathy ([place])     Chronic anticoagulation    a.) Rivaroxaban   CLL (chronic lymphocytic leukemia) ([place]) 05/24/2017   DVT (deep venous thrombosis) (HCC)    GERD (gastroesophageal reflux disease)    HFrEF (heart failure with reduced ejection fraction) ([place])    a.) TTE 02/22/2020 --> mod. LV systolic dysfunction; LVEF 35-40%.   History of hiatal hernia    History of shingles 2013   Hyperlipemia    Hyperplastic polyps of stomach    a.) Bx on 07/24/2020 --> well differentiated NET; WHO grade I.   Hypertension    NSTEMI (non-ST elevated myocardial infarction) ([place]) 05/02/2013   a.) LHC 05/03/2013 --> 50% LM, 100% LAD, 75% mLCx, 100% dLCx, 60% mRCA; patient LIMA-LAD, occluded SVG-D1 and SVG-PDA. SVG-OM with extensive thrombus and distal 99% lesion. HIGH RISK PCI performed at [facility] with placement of DES x 3 to 2 distinct areas of SVG with filter protection.   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 No intervention; medical management.   Valvular regurgitation    a.) TTE 02/22/2020 --> LVEF 35-40%; mild panvalvular.   Past Surgical History:  Procedure Laterality Date   ABDOMINAL AORTA STENT     BIOPSY  11/05/2021   Procedure: BIOPSY;  Surgeon: Irving Copas., MD;  Location: [facility];  Service: Gastroenterology;;   CHOLECYSTECTOMY     COLONOSCOPY  11/24/2009   COLONOSCOPY N/A 10/09/2020   Procedure: COLONOSCOPY;   Surgeon: Irving Copas., MD;  Location: [facility];  Service: Gastroenterology;  Laterality: N/A;   COLONOSCOPY WITH PROPOFOL N/A 07/24/2020   Procedure: COLONOSCOPY WITH PROPOFOL;  Surgeon: Toledo, Benay Pike, MD;  Location: [facility];  Service: Gastroenterology;  Laterality: N/A;   CORONARY ANGIOPLASTY WITH STENT PLACEMENT Left 05/03/2013   Procedure: CORONARY ANGIOPLASTY WITH STENT PLACEMENT (DES x 3 to SVG-OM graft); Location: [facility]   CORONARY ARTERY BYPASS GRAFT N/A 01/21/2002   Procedure: 4V CABG (LIMA-LAD, SVG-D1, SVG-RCA, SVG-OM); Location: [facility]   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   Procedure: DUPUYTREN CONTRACTURE RELEASE LEFT LONG FINGER;  Surgeon: Corky Mull, MD;  Location: [facility];  Service: Orthopedics;  Laterality: Left;   DUPUYTREN CONTRACTURE RELEASE Right 10/18/2020   Procedure: DUPUYTREN CONTRACTURE RELEASE;  Surgeon: Corky Mull, MD;  Location: [facility];  Service: Orthopedics;  Laterality: Right;   ENDOVASCULAR REPAIR/STENT GRAFT N/A 09/24/2017   Procedure: ENDOVASCULAR REPAIR/STENT GRAFT (27 mm x 12 cm RIGHT iliac limb extension to distal CIA);  Surgeon: Algernon Huxley, MD;  Location: [place] [facility];  Service: Cardiovascular;  Laterality: N/A;   ESOPHAGOGASTRODUODENOSCOPY  05/26/02  03/23/12   ESOPHAGOGASTRODUODENOSCOPY (EGD) WITH PROPOFOL N/A 10/28/2016   Procedure: ESOPHAGOGASTRODUODENOSCOPY (EGD) WITH PROPOFOL;  Surgeon: Manya Silvas, MD;  Location: [facility];  Service: Endoscopy;  Laterality: N/A;   ESOPHAGOGASTRODUODENOSCOPY (EGD) WITH PROPOFOL N/A 11/05/2021   Procedure: ESOPHAGOGASTRODUODENOSCOPY (EGD) WITH PROPOFOL;  Surgeon: Rush Landmark Telford Nab., MD;  Location: [facility];  Service: Gastroenterology;  Laterality: N/A;   EUS N/A 10/09/2020   Procedure: LOWER ENDOSCOPIC ULTRASOUND (EUS);  Surgeon: Irving Copas., MD;  Location: [facility];  Service: Gastroenterology;  Laterality: N/A;   EUS N/A 11/05/2021    Procedure: LOWER ENDOSCOPIC ULTRASOUND (EUS);  Surgeon: Irving Copas., MD;  Location: [facility];  Service: Gastroenterology;  Laterality: N/A;   FLEXIBLE SIGMOIDOSCOPY N/A 11/05/2021   Procedure: FLEXIBLE SIGMOIDOSCOPY;  Surgeon: Rush Landmark Telford Nab., MD;  Location: [facility];  Service: Gastroenterology;  Laterality: N/A;   HEMOSTASIS CLIP PLACEMENT  11/05/2021   Procedure: HEMOSTASIS CLIP PLACEMENT;  Surgeon: Irving Copas., MD;  Location: [facility];  Service: Gastroenterology;;   INCISION AND DRAINAGE ABSCESS N/A 06/14/2020   Procedure: INCISION AND DRAINAGE ABSCESS;  Surgeon: Herbert Pun, MD;  Location: [facility];  Service: General;  Laterality: N/A;   INGUINAL HERNIA REPAIR Left    LEFT HEART CATH AND CORONARY ANGIOGRAPHY N/A 01/19/2018   Procedure: LEFT HEART CATH AND CORONARY ANGIOGRAPHY;  Surgeon: Teodoro Spray, MD;  Location: [place] [facility];  Service: Cardiovascular;  Laterality: N/A;   LEFT HEART  CATH AND CORONARY ANGIOGRAPHY Left 11/14/1995   Procedure: CARDIAC CATHETERIZATION (PTCA); Location: [facility]; Surgeon: Doristine Bosworth, MD   LEFT HEART CATH AND CORS/GRAFTS ANGIOGRAPHY Left 07/30/2014   Procedure: CARDIAC CATHETERIZATION; Location: [facility]   PERIPHERAL VASCULAR CATHETERIZATION N/A 09/06/2014   Procedure: IVC Filter Removal;  Surgeon: Katha Cabal, MD;  Location: [facility];  Service: Cardiovascular;  Laterality: N/A;   POLYPECTOMY  10/09/2020   Procedure: POLYPECTOMY;  Surgeon: Rush Landmark Telford Nab., MD;  Location: [facility];  Service: Gastroenterology;;   POLYPECTOMY  11/05/2021   Procedure: POLYPECTOMY;  Surgeon: Irving Copas., MD;  Location: [facility];  Service: Gastroenterology;;   [place] INJECTION  10/09/2020   Procedure: SUBMUCOSAL LIFTING INJECTION;  Surgeon: Irving Copas., MD;  Location: [facility];  Service: Gastroenterology;;   [place] INJECTION  11/05/2021    Procedure: SUBMUCOSAL TATTOO INJECTION;  Surgeon: Irving Copas., MD;  Location: [facility];  Service: Gastroenterology;;   TRANSURETHRAL RESECTION OF PROSTATE N/A 02/20/2015   Procedure: TRANSURETHRAL RESECTION OF THE PROSTATE (TURP);  Surgeon: Hollice Espy, MD;  Location: [facility];  Service: Urology;  Laterality: N/A;   Patient Active Problem List   Diagnosis Date Noted   Carotid stenosis 09/26/2020   Primary neuroendocrine carcinoma of rectum ([place]) 08/22/2020   Dupuytren's contracture of left hand 11/02/2018   Sepsis ([place]) 08/27/2018   Non-ST elevation MI (NSTEMI) ([place]) 01/18/2018   AAA (abdominal aortic aneurysm) ([place]) 09/24/2017   Leaking abdominal aortic aneurysm (AAA) ([place]) 08/22/2017   CLL (chronic lymphocytic leukemia) ([place]) 05/24/2017   Goals of care, counseling/discussion 05/24/2017   B12 deficiency 03/21/2017   Elevated troponin I level 03/03/2015   Deep venous thrombosis of profunda femoris vein ([place]) 01/23/2015   BPH (benign prostatic hyperplasia) 01/23/2015   Urge urinary incontinence 01/23/2015   Type 2 diabetes mellitus ([place]) 10/31/2014   Benign gastric polyp 10/31/2014   Absence of bladder continence 10/09/2014   Acute non-ST elevation myocardial infarction (NSTEMI) ([place]) 08/15/2014   Atrial fibrillation ([place]) 08/05/2014   Antiphospholipid syndrome ([place]) 08/01/2014   H/O deep venous thrombosis 08/01/2014   Non-ST elevation myocardial infarction (NSTEMI), subendocardial infarction, subsequent episode of care ([place]) 07/30/2014   Barrett esophagus 07/28/2014   Diabetes mellitus, type 2 ([place]) 07/28/2014   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/28/2014   Hyperplastic adenomatous polyp of stomach 07/28/2014   Benign essential HTN 33/29/5188   Chronic systolic heart failure ([place]) 04/19/2014   AAA (abdominal aortic aneurysm) without rupture ([place]) 04/05/2014   Arteriosclerosis of coronary artery 04/05/2014   Breathlessness on exertion 04/05/2014   Abdominal  aortic aneurysm (AAA) without rupture ([place]) 04/05/2014   Acute non-ST segment elevation myocardial infarction ([place]) 05/14/2013   Acid reflux 05/04/2013   Combined fat and carbohydrate induced hyperlipemia 05/03/2013   Obstructive apnea 05/03/2013   Arthritis, degenerative 05/03/2013   Peripheral blood vessel disorder ([place]) 05/03/2013   Arthritis or polyarthritis, rheumatoid ([place]) 05/03/2013   Compulsive tobacco user syndrome 05/03/2013   Rheumatoid arthritis ([place]) 05/03/2013   Peripheral vascular disease ([place]) 05/03/2013   Current tobacco use 05/03/2013   PCP: [name], MD REFERRING PROVIDER: Meeler, Sherren Kerns, FNP REFERRING DIAG: DDD Lumbar radiculitis Rationale for Evaluation and Treatment: Rehabilitation THERAPY DIAG:  Other low back pain  Muscle weakness (generalized) ONSET DATE: 4 years ago SUBJECTIVE:  SUBJECTIVE STATEMENT:  Pt reports muscle soreness in thighs after previous treatment, however, no pain medications necessary. Pt reports feeling stronger in LEs bilaterally. Pt reports compliance with all HEP.  PERTINENT HISTORY:  Patient is a 76 year old male presenting with low back pain. Pt reports symptoms initiated about 4 years ago and has been receiving injections for over a year now every three months. Pt is retired and lives alone. Pt reports low back pain all the time with the occasional radiating symptoms into BLE with the left being more symptomatic. Radiating pain does not pass the knee, however occasional tingling sensation of both feet is reported- independent of radiating lower extremity pain. Pt reports pain in low back is aggravated with standing, walking, squatting, and doing activities on knees. Pt also reports concerns for balance not being good although he denies any  recent falls. Pt voices willingness to comply with physical therapy to see if there is anything we can do to help with his symptoms but understands he is aging.   PAIN:  Are you having pain? Yes: NPRS scale: 6/10 Pain location: lumbar spine, bilateral LE stopping at the knees Pain description: burning, radiates down legs worse on left  Aggravating factors: walking, bending over, squatting, getting on knees Relieving factors: resting, laying down, pain medications  PRECAUTIONS: Fall WEIGHT BEARING RESTRICTIONS: No FALLS:  Has patient fallen in last 6 months? No  LIVING ENVIRONMENT: Lives with: lives alone Lives in: House/apartment Stairs: Yes: Internal: 13 steps;   and External: 2 steps;   Has following equipment at home: None  OCCUPATION: Retired  PLOF: Independent and Independent with basic ADLs  PATIENT GOALS: Pain reduction, improve functional mobility, improve balance  OBJECTIVE:     12/10/21   Functional Gait  Assessment  Gait assessed  Yes  Gait Level Surface 1  Change in Gait Speed 2  Gait with Horizontal Head Turns 1  Gait with Vertical Head Turns 2  Gait and Pivot Turn 2  Step Over Obstacle 2  Gait with Narrow Base of Support 2  Gait with Eyes Closed 1  Ambulating Backwards 2  Steps 2  Total Score 17     DIAGNOSTIC FINDINGS:  CLINICAL DATA:  Intermittent low back pain with bilateral radiculopathy, left greater than right   EXAM: MRI LUMBAR SPINE WITHOUT CONTRAST    IMPRESSION: 1. Mild lower lumbar spondylosis with mild narrowing of the left neural foramina at L3-4, L4-5, and L5-S1. 2. Known abdominal aortic aneurysm status post endovascular repair, poorly characterized.  PATIENT SURVEYS:  FOTO 51/57  COGNITION: Overall cognitive status: Within functional limits for tasks assessed   SENSATION: WFL  LUMBAR ROM:  AROM eval  Flexion WFL, pain  Extension WFL  Right lateral flexion WFL, pain with OP  Left lateral flexion WFL  Right rotation  WFL  Left rotation WFL   (Blank rows = not tested)  LOWER EXTREMITY ROM:     Active  Right eval Left eval  Hip flexion WFL pain with OP WFL pain with OP  Hip extension 25% limited ilat  Hip internal rotation 50% limited   (Blank rows = not tested)  LOWER EXTREMITY MMT:    MMT Right eval Left eval  Hip flexion 4 4  Hip extension 3 3  Hip abduction 4 4  Hip adduction 4 4   (Blank rows = not tested)  FUNCTIONAL TESTS:  5 times sit to stand: 23.31 seconds 10 meter walk test: self selected speed: 12.53 seconds (0.80 m/s);  fastest speed: 9.55 seconds 1.04 m/s)   Today's Treatment 6 minute walk test: 1213 feets Total gym leg press; 2 sets of 10 reps; pt cued for eccentric control and core activation Supine Bridges 1 set of 10 reps Supine bridges with alternating LE liftoff 1 set of 10 reps, pt cued for keeping hips as high as possible Thread the Needle; bilaterally 1 set of 5 reps pt cued for max spine rotation Quadruped UE and opposite LE liftoffs 1 set of 8 reps bilaterally; pt required cued for core activation Squat to elevated mat table 2 sets, 10 reps, pt cued for minimal reliance on resting weight on table between reps Lumbar rotations 1 set of 10 reps, pt cued to stop knees at the point back moves off of table High knee walks with 4 pound ankle weights 30 feet x2, pt cued for max hip flexion Butt kick walks with 4 pound ankle weights 30 feet x2, pt qued for eccentric control during swing phase of exercise   PATIENT EDUCATION:  Education details: Patient was educated on diagnosis, anatomy and pathology involved, prognosis, role of PT, and was given an HEP, demonstrating exercise with proper form following verbal and tactile cues, and was given a paper hand out to continue exercise at home. Pt was educated on and agreed to plan of care.  Person educated: Patient Education method: Consulting civil engineer, Demonstration, Verbal cues, and Handouts Education comprehension: verbalized  understanding, returned demonstration, and verbal cues required  HOME EXERCISE PROGRAM: Standing Lumbar Extensions 12-20 reps, once every hour daily Supine hip flexor stretch, 30 second holds, 2 reps, 3 times per day Supine or seated Hamstring stretch, 30 second holds, 2 reps, 3 times per day  ASSESSMENT:  CLINICAL IMPRESSION: PT continued therapeutic exercises to address deficits in core and bilateral LE strength as well as balance. Patient completed 6 minute walk test for warmup and core activation and was able to complete test without any rest breaks. Pt voices concern for plank/pushup exercises but is open to initiating these movements as strength improves. Pt able to comply with all other therex without increase in pain following session. Patient will continue to benefit from skilled physical therapy to improve deficits in core and LE strength to increase independence with community ambulation and to improve performance of functional ADL.     OBJECTIVE IMPAIRMENTS: Abnormal gait, decreased activity tolerance, decreased balance, decreased endurance, decreased mobility, difficulty walking, decreased ROM, decreased strength, hypomobility, and pain.   ACTIVITY LIMITATIONS: carrying, lifting, bending, sitting, squatting, and stairs  PARTICIPATION LIMITATIONS: cleaning, community activity, and yard work  PERSONAL FACTORS: Age and Fitness are also affecting patient's functional outcome.   REHAB POTENTIAL: Good  CLINICAL DECISION MAKING: Evolving/moderate complexity  EVALUATION COMPLEXITY: Moderate   GOALS: Goals reviewed with patient? No  SHORT TERM GOALS: Target date: 01/03/2023  Pt will be independent with HEP in order to improve strength and balance in order to decrease fall risk and improve function at home and community.  Baseline: 12/05/2021 HEP given Goal status: INITIAL  LONG TERM GOALS: Target date: 01/30/2021  Patient will increase FOTO score to 57 to demonstrate  predicted increase in functional mobility to complete ADLs.  Baseline: 12/05/2021: 51 Goal status: INITIAL  2.  Patient will report a decrease in resting pain level to at least 3/10 pain for improved QoL. Baseline: 12/05/2021 Goal status: INITIAL  3.  Patient will increase self selected gait speed to 1.81ms for improved safety and independence during community ambulation. Baseline: 12/06/2021: 0.883m Goal  status: INITIAL  4.  Pt will improve 5TSTS time to at least 19 seconds for increased bilateral LE strength and decreased fall risk. Baseline: 12/06/2021: 23.31 seconds Goal status: INITIAL PLAN:  PT FREQUENCY: 1-2x/week  PT DURATION: 8 weeks  PLANNED INTERVENTIONS: Therapeutic exercises, Therapeutic activity, Neuromuscular re-education, Balance training, Gait training, Patient/Family education, Self Care, Joint mobilization, and Manual therapy.  PLAN FOR NEXT SESSION:  SLS time, STS for HEP   10:50 AM, 12/12/21 Telford Nab. Laurance Flatten, SPT    Mayo Ao, Student-PT 12/12/2021, 10:50 AM

## 2021-12-17 ENCOUNTER — Ambulatory Visit: Payer: Medicare PPO | Admitting: Physical Therapy

## 2021-12-19 ENCOUNTER — Ambulatory Visit: Payer: Medicare PPO | Admitting: Physical Therapy

## 2021-12-24 ENCOUNTER — Encounter: Payer: Medicare PPO | Admitting: Physical Therapy

## 2021-12-26 ENCOUNTER — Encounter: Payer: Medicare PPO | Admitting: Physical Therapy

## 2022-01-03 ENCOUNTER — Encounter: Payer: Medicare PPO | Admitting: Physical Therapy

## 2022-01-07 ENCOUNTER — Encounter: Payer: Self-pay | Admitting: Oncology

## 2022-01-08 ENCOUNTER — Ambulatory Visit: Payer: Medicare HMO | Attending: Family Medicine | Admitting: Physical Therapy

## 2022-01-08 ENCOUNTER — Encounter: Payer: Self-pay | Admitting: Physical Therapy

## 2022-01-08 DIAGNOSIS — M6281 Muscle weakness (generalized): Secondary | ICD-10-CM | POA: Insufficient documentation

## 2022-01-08 DIAGNOSIS — M5459 Other low back pain: Secondary | ICD-10-CM | POA: Diagnosis present

## 2022-01-08 NOTE — Therapy (Signed)
OUTPATIENT PHYSICAL THERAPY THORACOLUMBAR TREATMENT   Patient Name: Martin Adkins MRN: [id number] DOB:11/23/1945, 77 y.o., male Today's Date: 01/08/2022  END OF SESSION:  PT End of Session - 01/08/22 0922     Visit Number 4 (P)     Number of Visits 24 (P)     Date for PT Re-Evaluation 02/02/21 (P)     Authorization Type [employer] PPO: cert for 41/63/84-5/36/46 (P)     Authorization Time Period 12 visits 12/10/21-02/06/22 (P)     Authorization - Visit Number 38 (P)     Authorization - Number of Visits 12 (P)     Progress Note Due on Visit 10 (P)     PT Start Time 0919 (P)     PT Stop Time 0958 (P)     PT Time Calculation (min) 39 min (P)     Activity Tolerance Patient tolerated treatment well (P)     Behavior During Therapy WFL for tasks assessed/performed (P)                 Past Medical History:  Diagnosis Date   AAA (abdominal aortic aneurysm) without rupture ([place])    a.) [place] 02/09/04 - 3.1 x 3.4 cm. b.) [place] 02/14/05 - 3.62 x 3.63 cm. c.) [place] 02/25/06 - 3.97 x 4.0 cm. d.) CT 07/09/11 - 4.9 cm. e.) s/p EVAR 2013. f.) enlarging AAA --> back to the OR on 09/24/2017 for placement of a 27 mm x 12 cm RIGHT iliac extension limb down to distal CIA.   Aortic atherosclerosis (HCC)    APS (antiphospholipid syndrome) (HCC)    Arthritis    Atrial fibrillation (HCC)    a.) CHA2DS2-VASc Score = 6 (age, CHF, HTN, prior DVT x 2, prior MI). b.) on rivaroxaban   B12 deficiency 03/21/2017   Barrett's esophagus    BPH (benign prostatic hyperplasia)    CAD (coronary artery disease)    a.) LHC 11/14/95 --> 95% mLCx, 95% OM1; PTCA. b.) 4v CABG 2004 --> LIMA-LAD, SVG-D1,RCA,OM. c.) NSTEMI 05/02/2013 --> PCI with DES x 3 to SVG-OM in 2 distinct areas with filter. d.) Inf STEMI 07/30/14 - LHC --> 50% LM, 90% mLCx, 100% OM2, 100% RPDA; LIMA-LAD patent; 100% occ of SVG OM2,OM3,PDA; med mgmt. e.) LHC 01/19/18 - patent LIMA-LAD; other grafts occ; not amenable to PCI/further bypass.    Cardiomyopathy ([place])    Chronic anticoagulation    a.) Rivaroxaban   CLL (chronic lymphocytic leukemia) ([place]) 05/24/2017   DVT (deep venous thrombosis) (HCC)    GERD (gastroesophageal reflux disease)    HFrEF (heart failure with reduced ejection fraction) ([place])    a.) TTE 02/22/2020 --> mod. LV systolic dysfunction; LVEF 35-40%.   History of hiatal hernia    History of shingles 2013   Hyperlipemia    Hyperplastic polyps of stomach    a.) Bx on 07/24/2020 --> well differentiated NET; WHO grade I.   Hypertension    NSTEMI (non-ST elevated myocardial infarction) ([place]) 05/02/2013   a.) LHC 05/03/2013 --> 50% LM, 100% LAD, 75% mLCx, 100% dLCx, 60% mRCA; patient LIMA-LAD, occluded SVG-D1 and SVG-PDA. SVG-OM with extensive thrombus and distal 99% lesion. HIGH RISK PCI performed at [facility] with placement of DES x 3 to 2 distinct areas of SVG with filter protection.   OSA (obstructive sleep apnea)    a.) does NOT use nocturnal PAP therapy   Osteoarthritis    Pneumonia    Pre-diabetes    Primary neuroendocrine carcinoma of rectum (  [place]) 07/24/2020   a.) Bx (+) for well differentiated NET; WHO grade I.  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 No intervention; medical management.   Valvular regurgitation    a.) TTE 02/22/2020 --> LVEF 35-40%; mild panvalvular.   Past Surgical History:  Procedure Laterality Date   ABDOMINAL AORTA STENT     BIOPSY  11/05/2021   Procedure: BIOPSY;  Surgeon: Irving Copas., MD;  Location: [facility];  Service: Gastroenterology;;   CHOLECYSTECTOMY     COLONOSCOPY  11/24/2009   COLONOSCOPY N/A 10/09/2020    Procedure: COLONOSCOPY;  Surgeon: Irving Copas., MD;  Location: [facility];  Service: Gastroenterology;  Laterality: N/A;   COLONOSCOPY WITH PROPOFOL N/A 07/24/2020   Procedure: COLONOSCOPY WITH PROPOFOL;  Surgeon: Toledo, Benay Pike, MD;  Location: [facility];  Service: Gastroenterology;  Laterality: N/A;   CORONARY ANGIOPLASTY WITH STENT PLACEMENT Left 05/03/2013   Procedure: CORONARY ANGIOPLASTY WITH STENT PLACEMENT (DES x 3 to SVG-OM graft); Location: [facility]   CORONARY ARTERY BYPASS GRAFT N/A 01/21/2002   Procedure: 4V CABG (LIMA-LAD, SVG-D1, SVG-RCA, SVG-OM); Location: [facility]   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   Procedure: DUPUYTREN CONTRACTURE RELEASE LEFT LONG FINGER;  Surgeon: Corky Mull, MD;  Location: [facility];  Service: Orthopedics;  Laterality: Left;   DUPUYTREN CONTRACTURE RELEASE Right 10/18/2020   Procedure: DUPUYTREN CONTRACTURE RELEASE;  Surgeon: Corky Mull, MD;  Location: [facility];  Service: Orthopedics;  Laterality: Right;   ENDOVASCULAR REPAIR/STENT GRAFT N/A 09/24/2017   Procedure: ENDOVASCULAR REPAIR/STENT GRAFT (27 mm x 12 cm RIGHT iliac limb extension to distal CIA);  Surgeon: Algernon Huxley, MD;  Location: [place] [facility];  Service: Cardiovascular;  Laterality: N/A;   ESOPHAGOGASTRODUODENOSCOPY  05/26/02  03/23/12   ESOPHAGOGASTRODUODENOSCOPY (EGD) WITH PROPOFOL N/A 10/28/2016   Procedure: ESOPHAGOGASTRODUODENOSCOPY (EGD) WITH PROPOFOL;  Surgeon: Manya Silvas, MD;  Location[facility];  Service: Endoscopy;  Laterality: N/A;   ESOPHAGOGASTRODUODENOSCOPY (EGD) WITH PROPOFOL N/A 11/05/2021   Procedure: ESOPHAGOGASTRODUODENOSCOPY (EGD) WITH PROPOFOL;  Surgeon: Rush Landmark Telford Nab., MD;  Location: [facility];  Service: Gastroenterology;  Laterality: N/A;   EUS N/A 10/09/2020   Procedure: LOWER ENDOSCOPIC ULTRASOUND (EUS);  Surgeon: Irving Copas., MD;  Location: [facility];  Service: Gastroenterology;  Laterality: N/A;    EUS N/A 11/05/2021   Procedure: LOWER ENDOSCOPIC ULTRASOUND (EUS);  Surgeon: Irving Copas., MD;  Location: [facility];  Service: Gastroenterology;  Laterality: N/A;   FLEXIBLE SIGMOIDOSCOPY N/A 11/05/2021   Procedure: FLEXIBLE SIGMOIDOSCOPY;  Surgeon: Rush Landmark Telford Nab., MD;  Location: [facility];  Service: Gastroenterology;  Laterality: N/A;   HEMOSTASIS CLIP PLACEMENT  11/05/2021   Procedure: HEMOSTASIS CLIP PLACEMENT;  Surgeon: Irving Copas., MD;  Location: [facility];  Service: Gastroenterology;;   INCISION AND DRAINAGE ABSCESS N/A 06/14/2020   Procedure: INCISION AND DRAINAGE ABSCESS;  Surgeon: Herbert Pun, MD;  Location: [facility];  Service: General;  Laterality: N/A;   INGUINAL HERNIA REPAIR Left    LEFT HEART CATH AND CORONARY ANGIOGRAPHY N/A 01/19/2018   Procedure: LEFT HEART CATH AND CORONARY ANGIOGRAPHY;  Surgeon: Ubaldo Glassing,  [name], MD;  Location: [place] [facility];  Service: Cardiovascular;  Laterality: N/A;   LEFT HEART CATH AND CORONARY ANGIOGRAPHY Left 11/14/1995   Procedure: CARDIAC CATHETERIZATION (PTCA); Location: [facility]; Surgeon: Doristine Bosworth, MD   LEFT HEART CATH AND CORS/GRAFTS ANGIOGRAPHY Left 07/30/2014   Procedure: CARDIAC CATHETERIZATION; Location: [facility]   PERIPHERAL VASCULAR CATHETERIZATION N/A 09/06/2014   Procedure: IVC Filter Removal;  Surgeon: Katha Cabal, MD;  Location: [place] [facility];  Service: Cardiovascular;  Laterality: N/A;   POLYPECTOMY  10/09/2020   Procedure: POLYPECTOMY;  Surgeon: Rush Landmark Telford Nab., MD;  Location: [facility];  Service: Gastroenterology;;   POLYPECTOMY  11/05/2021   Procedure: POLYPECTOMY;  Surgeon: Irving Copas., MD;  Location: [facility];  Service: Gastroenterology;;   [place] INJECTION  10/09/2020   Procedure: SUBMUCOSAL LIFTING INJECTION;  Surgeon: Irving Copas., MD;  Location: [facility];  Service: Gastroenterology;;   [place]  INJECTION  11/05/2021   Procedure: SUBMUCOSAL TATTOO INJECTION;  Surgeon: Irving Copas., MD;  Location: [facility];  Service: Gastroenterology;;   TRANSURETHRAL RESECTION OF PROSTATE N/A 02/20/2015   Procedure: TRANSURETHRAL RESECTION OF THE PROSTATE (TURP);  Surgeon: Hollice Espy, MD;  Location: [facility];  Service: Urology;  Laterality: N/A;   Patient Active Problem List   Diagnosis Date Noted   Carotid stenosis 09/26/2020   Primary neuroendocrine carcinoma of rectum ([place]) 08/22/2020   Dupuytren's contracture of left hand 11/02/2018   Sepsis ([place]) 08/27/2018   Non-ST elevation MI (NSTEMI) ([place]) 01/18/2018   AAA (abdominal aortic aneurysm) ([place]) 09/24/2017   Leaking abdominal aortic aneurysm (AAA) ([place]) 08/22/2017   CLL (chronic lymphocytic leukemia) ([place]) 05/24/2017   Goals of care, counseling/discussion 05/24/2017   B12 deficiency 03/21/2017   Elevated troponin I level 03/03/2015   Deep venous thrombosis of profunda femoris vein ([place]) 01/23/2015   BPH (benign prostatic hyperplasia) 01/23/2015   Urge urinary incontinence 01/23/2015   Type 2 diabetes mellitus ([place]) 10/31/2014   Benign gastric polyp 10/31/2014   Absence of bladder continence 10/09/2014   Acute non-ST elevation myocardial infarction (NSTEMI) ([place]) 08/15/2014   Atrial fibrillation ([place]) 08/05/2014   Antiphospholipid syndrome ([place]) 08/01/2014   H/O deep venous thrombosis 08/01/2014   Non-ST elevation myocardial infarction (NSTEMI), subendocardial infarction, subsequent episode of care ([place]) 07/30/2014   Barrett esophagus 07/28/2014   Diabetes mellitus, type 2 ([place]) 07/28/2014   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/28/2014   Hyperplastic adenomatous polyp of stomach 07/28/2014   Benign essential HTN 46/65/9935   Chronic systolic heart failure ([place]) 04/19/2014   AAA (abdominal aortic aneurysm) without rupture ([place]) 04/05/2014   Arteriosclerosis of coronary artery 04/05/2014   Breathlessness on exertion  04/05/2014   Abdominal aortic aneurysm (AAA) without rupture ([place]) 04/05/2014   Acute non-ST segment elevation myocardial infarction ([place]) 05/14/2013   Acid reflux 05/04/2013   Combined fat and carbohydrate induced hyperlipemia 05/03/2013   Obstructive apnea 05/03/2013   Arthritis, degenerative 05/03/2013   Peripheral blood vessel disorder ([place]) 05/03/2013   Arthritis or polyarthritis, rheumatoid ([place]) 05/03/2013   Compulsive tobacco user syndrome 05/03/2013   Rheumatoid arthritis ([place]) 05/03/2013   Peripheral vascular disease ([place]) 05/03/2013   Current tobacco use 05/03/2013   PCP: [name], MD REFERRING PROVIDER: Meeler, Sherren Kerns, FNP REFERRING DIAG: DDD Lumbar radiculitis Rationale for Evaluation and Treatment: Rehabilitation THERAPY DIAG:  Other low back pain  Muscle weakness (generalized) ONSET DATE: 4 years ago SUBJECTIVE:  SUBJECTIVE STATEMENT:  Pt returns to therapy after month absence due to insurance lapse. Reports his R side of his back is bothering him 5/10 on NPRS, but has been feeling good overall for past several weeks. Has been completing HEP.   PERTINENT HISTORY:  Patient is a 77 year old male presenting with low back pain. Pt reports symptoms initiated about 4 years ago and has been receiving injections for over a year now every three months. Pt is retired and lives alone. Pt reports low back pain all the time with the occasional radiating symptoms into BLE with the left being more symptomatic. Radiating pain does not pass the knee, however occasional tingling sensation of both feet is reported- independent of radiating lower extremity pain. Pt reports pain in low back is aggravated with standing, walking, squatting, and doing activities on knees. Pt also reports concerns for  balance not being good although he denies any recent falls. Pt voices willingness to comply with physical therapy to see if there is anything we can do to help with his symptoms but understands he is aging.   PAIN:  Are you having pain? Yes: NPRS scale: 6/10 Pain location: lumbar spine, bilateral LE stopping at the knees Pain description: burning, radiates down legs worse on left  Aggravating factors: walking, bending over, squatting, getting on knees Relieving factors: resting, laying down, pain medications  PRECAUTIONS: Fall WEIGHT BEARING RESTRICTIONS: No FALLS:  Has patient fallen in last 6 months? No  LIVING ENVIRONMENT: Lives with: lives alone Lives in: House/apartment Stairs: Yes: Internal: 13 steps;   and External: 2 steps;   Has following equipment at home: None  OCCUPATION: Retired  PLOF: Independent and Independent with basic ADLs  PATIENT GOALS: Pain reduction, improve functional mobility, improve balance  OBJECTIVE:     12/10/21   Functional Gait  Assessment  Gait assessed  Yes  Gait Level Surface 1  Change in Gait Speed 2  Gait with Horizontal Head Turns 1  Gait with Vertical Head Turns 2  Gait and Pivot Turn 2  Step Over Obstacle 2  Gait with Narrow Base of Support 2  Gait with Eyes Closed 1  Ambulating Backwards 2  Steps 2  Total Score 17     DIAGNOSTIC FINDINGS:  CLINICAL DATA:  Intermittent low back pain with bilateral radiculopathy, left greater than right   EXAM: MRI LUMBAR SPINE WITHOUT CONTRAST    IMPRESSION: 1. Mild lower lumbar spondylosis with mild narrowing of the left neural foramina at L3-4, L4-5, and L5-S1. 2. Known abdominal aortic aneurysm status post endovascular repair, poorly characterized.  PATIENT SURVEYS:  FOTO 51/57  COGNITION: Overall cognitive status: Within functional limits for tasks assessed   SENSATION: WFL  LUMBAR ROM:  AROM eval  Flexion WFL, pain  Extension WFL  Right lateral flexion WFL, pain with  OP  Left lateral flexion WFL  Right rotation WFL  Left rotation WFL   (Blank rows = not tested)  LOWER EXTREMITY ROM:     Active  Right eval Left eval  Hip flexion WFL pain with OP WFL pain with OP  Hip extension 25% limited ilat  Hip internal rotation 50% limited   (Blank rows = not tested)  LOWER EXTREMITY MMT:    MMT Right eval Left eval  Hip flexion 4 4  Hip extension 3 3  Hip abduction 4 4  Hip adduction 4 4   (Blank rows = not tested)  FUNCTIONAL TESTS:  5 times sit to stand: 23.31  seconds 10 meter walk test: self selected speed: 12.53 seconds (0.80 m/s); fastest speed: 9.55 seconds 1.04 m/s)   Today's Treatment Nustep seat 14 UE 17 L5 for gentle mobility and strengthening Lower trunk rotations x20 with difficulty with lumbopelvic dissociation Open book x12 bilat with cuing for set up with good carry over Bridge 2x 12 with cuing for glute activaoitn > lumbar ext with good carry over Prone supermans 2x12 with cuing to prevent excessive trunk rotation with good carry over Bird dog 2x 12 with good carry over of cuing for technique of therex with good carry over STS with 15# ball overhead press 3x 6 for power with good carry over of initial cuing OG amb 369f 3# AW cued for fastest speed    PATIENT EDUCATION:  Education details: Patient was educated on diagnosis, anatomy and pathology involved, prognosis, role of PT, and was given an HEP, demonstrating exercise with proper form following verbal and tactile cues, and was given a paper hand out to continue exercise at home. Pt was educated on and agreed to plan of care.  Person educated: Patient Education method: EConsulting civil engineer Demonstration, Verbal cues, and Handouts Education comprehension: verbalized understanding, returned demonstration, and verbal cues required  HOME EXERCISE PROGRAM: Standing Lumbar Extensions 12-20 reps, once every hour daily Supine hip flexor stretch, 30 second holds, 2 reps, 3 times per  day Supine or seated Hamstring stretch, 30 second holds, 2 reps, 3 times per day  ASSESSMENT:  CLINICAL IMPRESSION: PT continued therapeutic exercises to address deficits in core and bilateral LE strength as well as balance. Patient reports LBP reduced from 5/10 to 3/10 following nustep, and mobility therex. Patient is able to comply with all cuing for proper technique of therex with cuing needed for lumbopelvic dissociation and glute max activation. PT will continue progression as able.      OBJECTIVE IMPAIRMENTS: Abnormal gait, decreased activity tolerance, decreased balance, decreased endurance, decreased mobility, difficulty walking, decreased ROM, decreased strength, hypomobility, and pain.   ACTIVITY LIMITATIONS: carrying, lifting, bending, sitting, squatting, and stairs  PARTICIPATION LIMITATIONS: cleaning, community activity, and yard work  PERSONAL FACTORS: Age and Fitness are also affecting patient's functional outcome.   REHAB POTENTIAL: Good  CLINICAL DECISION MAKING: Evolving/moderate complexity  EVALUATION COMPLEXITY: Moderate   GOALS: Goals reviewed with patient? No  SHORT TERM GOALS: Target date: 01/03/2023  Pt will be independent with HEP in order to improve strength and balance in order to decrease fall risk and improve function at home and community.  Baseline: 12/05/2021 HEP given Goal status: INITIAL  LONG TERM GOALS: Target date: 01/30/2021  Patient will increase FOTO score to 57 to demonstrate predicted increase in functional mobility to complete ADLs.  Baseline: 12/05/2021: 51 Goal status: INITIAL  2.  Patient will report a decrease in resting pain level to at least 3/10 pain for improved QoL. Baseline: 12/05/2021 Goal status: INITIAL  3.  Patient will increase self selected gait speed to 1.134m for improved safety and independence during community ambulation. Baseline: 12/06/2021: 0.73m63mGoal status: INITIAL  4.  Pt will improve 5TSTS time  to at least 19 seconds for increased bilateral LE strength and decreased fall risk. Baseline: 12/06/2021: 23.31 seconds Goal status: INITIAL PLAN:  PT FREQUENCY: 1-2x/week  PT DURATION: 8 weeks  PLANNED INTERVENTIONS: Therapeutic exercises, Therapeutic activity, Neuromuscular re-education, Balance training, Gait training, Patient/Family education, Self Care, Joint mobilization, and Manual therapy.  PLAN FOR NEXT SESSION:  continue POCClifton SpringsT  [place]akai DezzaT 01/08/2022,  10:02 AM  

## 2022-01-10 ENCOUNTER — Encounter: Payer: Self-pay | Admitting: Physical Therapy

## 2022-01-10 ENCOUNTER — Ambulatory Visit: Payer: Medicare HMO | Admitting: Physical Therapy

## 2022-01-10 DIAGNOSIS — M5459 Other low back pain: Secondary | ICD-10-CM

## 2022-01-10 DIAGNOSIS — M6281 Muscle weakness (generalized): Secondary | ICD-10-CM

## 2022-01-10 NOTE — Therapy (Signed)
OUTPATIENT PHYSICAL THERAPY THORACOLUMBAR TREATMENT   Patient Name: Martin Adkins MRN: [id number] DOB:1945/11/06, 77 y.o., male Today's Date: 01/10/2022  END OF SESSION:  PT End of Session - 01/10/22 1053     Visit Number 4    Number of Visits 24    Date for PT Re-Evaluation 02/02/21    Authorization Type [employer] PPO: cert for [id number]/26/[id number]/[id number]    Authorization Time Period 12 visits 12/10/21-02/06/22    Authorization - Visit Number 4    Authorization - Number of Visits 12    Progress Note Due on Visit 10    PT Start Time 1053    PT Stop Time 0277    PT Time Calculation (min) 38 min    Activity Tolerance Patient tolerated treatment well    Behavior During Therapy WFL for tasks assessed/performed                 Past Medical History:  Diagnosis Date   AAA (abdominal aortic aneurysm) without rupture ([place])    a.) [place] 02/09/04 - 3.1 x 3.4 cm. b.) [place] 02/14/05 - 3.62 x 3.63 cm. c.) [place] 02/25/06 - 3.97 x 4.0 cm. d.) CT 07/09/11 - 4.9 cm. e.) s/p EVAR 2013. f.) enlarging AAA --> back to the OR on 09/24/2017 for placement of a 27 mm x 12 cm RIGHT iliac extension limb down to distal CIA.   Aortic atherosclerosis (HCC)    APS (antiphospholipid syndrome) (HCC)    Arthritis    Atrial fibrillation (HCC)    a.) CHA2DS2-VASc Score = 6 (age, CHF, HTN, prior DVT x 2, prior MI). b.) on rivaroxaban   B12 deficiency 03/21/2017   Barrett's esophagus    BPH (benign prostatic hyperplasia)    CAD (coronary artery disease)    a.) LHC 11/14/95 --> 95% mLCx, 95% OM1; PTCA. b.) 4v CABG 2004 --> LIMA-LAD, SVG-D1,RCA,OM. c.) NSTEMI 05/02/2013 --> PCI with DES x 3 to SVG-OM in 2 distinct areas with filter. d.) Inf STEMI 07/30/14 - LHC --> 50% LM, 90% mLCx, 100% OM2, 100% RPDA; LIMA-LAD patent; 100% occ of SVG OM2,OM3,PDA; med mgmt. e.) LHC 01/19/18 - patent LIMA-LAD; other grafts occ; not amenable to PCI/further bypass.   Cardiomyopathy ([place])    Chronic anticoagulation    a.)  Rivaroxaban   CLL (chronic lymphocytic leukemia) ([place]) 05/24/2017   DVT (deep venous thrombosis) (HCC)    GERD (gastroesophageal reflux disease)    HFrEF (heart failure with reduced ejection fraction) ([place])    a.) TTE 02/22/2020 --> mod. LV systolic dysfunction; LVEF 35-40%.   History of hiatal hernia    History of shingles 2013   Hyperlipemia    Hyperplastic polyps of stomach    a.) Bx on 07/24/2020 --> well differentiated NET; WHO grade I.   Hypertension    NSTEMI (non-ST elevated myocardial infarction) ([place]) 05/02/2013   a.) LHC 05/03/2013 --> 50% LM, 100% LAD, 75% mLCx, 100% dLCx, 60% mRCA; patient LIMA-LAD, occluded SVG-D1 and SVG-PDA. SVG-OM with extensive thrombus and distal 99% lesion. HIGH RISK PCI performed at [facility] with placement of DES x 3 to 2 distinct areas of SVG with filter protection.   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 No intervention; medical management.   Valvular regurgitation    a.) TTE 02/22/2020 --> LVEF 35-40%; mild panvalvular.   Past Surgical History:  Procedure Laterality Date   ABDOMINAL AORTA STENT     BIOPSY  11/05/2021   Procedure: BIOPSY;  Surgeon: Irving Copas., MD;  Location: [facility];  Service: Gastroenterology;;   CHOLECYSTECTOMY     COLONOSCOPY  11/24/2009   COLONOSCOPY N/A 10/09/2020   Procedure: COLONOSCOPY;  Surgeon: Irving Copas.,  MD;  Location: [facility];  Service: Gastroenterology;  Laterality: N/A;   COLONOSCOPY WITH PROPOFOL N/A 07/24/2020   Procedure: COLONOSCOPY WITH PROPOFOL;  Surgeon: Toledo, Benay Pike, MD;  Location: [facility];  Service: Gastroenterology;  Laterality: N/A;   CORONARY ANGIOPLASTY WITH STENT PLACEMENT Left 05/03/2013   Procedure: CORONARY ANGIOPLASTY WITH STENT PLACEMENT (DES x 3 to SVG-OM graft); Location: [facility]   CORONARY ARTERY BYPASS GRAFT N/A 01/21/2002   Procedure: 4V CABG (LIMA-LAD, SVG-D1, SVG-RCA, SVG-OM); Location: [facility]   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   Procedure: DUPUYTREN CONTRACTURE RELEASE LEFT LONG FINGER;  Surgeon: Corky Mull, MD;  Location: [facility];  Service: Orthopedics;  Laterality: Left;   DUPUYTREN CONTRACTURE RELEASE Right 10/18/2020   Procedure: DUPUYTREN CONTRACTURE RELEASE;  Surgeon: Corky Mull, MD;  Location: [facility];  Service: Orthopedics;  Laterality: Right;   ENDOVASCULAR REPAIR/STENT GRAFT N/A 09/24/2017   Procedure: ENDOVASCULAR REPAIR/STENT GRAFT (27 mm x 12 cm RIGHT iliac limb extension to distal CIA);  Surgeon: Algernon Huxley, MD;  Location: [place] [facility];  Service: Cardiovascular;  Laterality: N/A;   ESOPHAGOGASTRODUODENOSCOPY  05/26/02  03/23/12   ESOPHAGOGASTRODUODENOSCOPY (EGD) WITH PROPOFOL N/A 10/28/2016   Procedure: ESOPHAGOGASTRODUODENOSCOPY (EGD) WITH PROPOFOL;  Surgeon: Manya Silvas, MD;  Location: [facility];  Service: Endoscopy;  Laterality: N/A;   ESOPHAGOGASTRODUODENOSCOPY (EGD) WITH PROPOFOL N/A 11/05/2021   Procedure: ESOPHAGOGASTRODUODENOSCOPY (EGD) WITH PROPOFOL;  Surgeon: Rush Landmark Telford Nab., MD;  Location: [facility];  Service: Gastroenterology;  Laterality: N/A;   EUS N/A 10/09/2020   Procedure: LOWER ENDOSCOPIC ULTRASOUND (EUS);  Surgeon: Irving Copas., MD;  Location: [facility];  Service: Gastroenterology;  Laterality: N/A;   EUS N/A 11/05/2021   Procedure: LOWER ENDOSCOPIC  ULTRASOUND (EUS);  Surgeon: Irving Copas., MD;  Location: [facility];  Service: Gastroenterology;  Laterality: N/A;   FLEXIBLE SIGMOIDOSCOPY N/A 11/05/2021   Procedure: FLEXIBLE SIGMOIDOSCOPY;  Surgeon: Rush Landmark Telford Nab., MD;  Location: [facility];  Service: Gastroenterology;  Laterality: N/A;   HEMOSTASIS CLIP PLACEMENT  11/05/2021   Procedure: HEMOSTASIS CLIP PLACEMENT;  Surgeon: Irving Copas., MD;  Location: [facility];  Service: Gastroenterology;;   INCISION AND DRAINAGE ABSCESS N/A 06/14/2020   Procedure: INCISION AND DRAINAGE ABSCESS;  Surgeon: Herbert Pun, MD;  Location: [facility];  Service: General;  Laterality: N/A;   INGUINAL HERNIA REPAIR Left    LEFT HEART CATH AND CORONARY ANGIOGRAPHY N/A 01/19/2018   Procedure: LEFT HEART CATH AND CORONARY ANGIOGRAPHY;  Surgeon: Teodoro Spray, MD;  Location: [place] [facility];  Service: Cardiovascular;  Laterality: N/A;   LEFT HEART CATH AND CORONARY ANGIOGRAPHY Left 11/14/1995  Procedure: CARDIAC CATHETERIZATION (PTCA); Location: [facility]; Surgeon: Doristine Bosworth, MD   LEFT HEART CATH AND CORS/GRAFTS ANGIOGRAPHY Left 07/30/2014   Procedure: CARDIAC CATHETERIZATION; Location: [facility]   PERIPHERAL VASCULAR CATHETERIZATION N/A 09/06/2014   Procedure: IVC Filter Removal;  Surgeon: Katha Cabal, MD;  Location: [facility] [facility];  Service: Cardiovascular;  Laterality: N/A;   POLYPECTOMY  10/09/2020   Procedure: POLYPECTOMY;  Surgeon: Rush Landmark Telford Nab., MD;  Location: [facility];  Service: Gastroenterology;;   POLYPECTOMY  11/05/2021   Procedure: POLYPECTOMY;  Surgeon: Irving Copas., MD;  Location: [facility];  Service: Gastroenterology;;   [place] INJECTION  10/09/2020   Procedure: SUBMUCOSAL LIFTING INJECTION;  Surgeon: Irving Copas., MD;  Location: [facility];  Service: Gastroenterology;;   [place] INJECTION  11/05/2021   Procedure: SUBMUCOSAL TATTOO  INJECTION;  Surgeon: Irving Copas., MD;  Location: [facility];  Service: Gastroenterology;;   TRANSURETHRAL RESECTION OF PROSTATE N/A 02/20/2015   Procedure: TRANSURETHRAL RESECTION OF THE PROSTATE (TURP);  Surgeon: Hollice Espy, MD;  Location: [facility];  Service: Urology;  Laterality: N/A;   Patient Active Problem List   Diagnosis Date Noted   Carotid stenosis 09/26/2020   Primary neuroendocrine carcinoma of rectum ([place]) 08/22/2020   Dupuytren's contracture of left hand 11/02/2018   Sepsis ([place]) 08/27/2018   Non-ST elevation MI (NSTEMI) ([place]) 01/18/2018   AAA (abdominal aortic aneurysm) ([place]) 09/24/2017   Leaking abdominal aortic aneurysm (AAA) ([place]) 08/22/2017   CLL (chronic lymphocytic leukemia) ([place]) 05/24/2017   Goals of care, counseling/discussion 05/24/2017   B12 deficiency 03/21/2017   Elevated troponin I level 03/03/2015   Deep venous thrombosis of profunda femoris vein ([place]) 01/23/2015   BPH (benign prostatic hyperplasia) 01/23/2015   Urge urinary incontinence 01/23/2015   Type 2 diabetes mellitus ([place]) 10/31/2014   Benign gastric polyp 10/31/2014   Absence of bladder continence 10/09/2014   Acute non-ST elevation myocardial infarction (NSTEMI) ([place]) 08/15/2014   Atrial fibrillation ([place]) 08/05/2014   Antiphospholipid syndrome ([place]) 08/01/2014   H/O deep venous thrombosis 08/01/2014   Non-ST elevation myocardial infarction (NSTEMI), subendocardial infarction, subsequent episode of care ([place]) 07/30/2014   Barrett esophagus 07/28/2014   Diabetes mellitus, type 2 ([place]) 07/28/2014   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/28/2014   Hyperplastic adenomatous polyp of stomach 07/28/2014   Benign essential HTN 91/63/8466   Chronic systolic heart failure ([place]) 04/19/2014   AAA (abdominal aortic aneurysm) without rupture ([place]) 04/05/2014   Arteriosclerosis of coronary artery 04/05/2014   Breathlessness on exertion 04/05/2014   Abdominal aortic aneurysm (AAA) without  rupture ([place]) 04/05/2014   Acute non-ST segment elevation myocardial infarction ([place]) 05/14/2013   Acid reflux 05/04/2013   Combined fat and carbohydrate induced hyperlipemia 05/03/2013   Obstructive apnea 05/03/2013   Arthritis, degenerative 05/03/2013   Peripheral blood vessel disorder ([place]) 05/03/2013   Arthritis or polyarthritis, rheumatoid ([place]) 05/03/2013   Compulsive tobacco user syndrome 05/03/2013   Rheumatoid arthritis ([place]) 05/03/2013   Peripheral vascular disease ([place]) 05/03/2013   Current tobacco use 05/03/2013   PCP: [name], MD REFERRING PROVIDER: Meeler, Sherren Kerns, FNP REFERRING DIAG: DDD Lumbar radiculitis Rationale for Evaluation and Treatment: Rehabilitation THERAPY DIAG:  Other low back pain  Muscle weakness (generalized) ONSET DATE: 4 years ago SUBJECTIVE:  SUBJECTIVE STATEMENT: Pt reports feeling sore in his hips after last visit. Minimal LBP this am. Completing HEP  PERTINENT HISTORY:  Patient is a 77 year old male presenting with low back pain. Pt reports symptoms initiated about 4 years ago and has been receiving injections for over a year now every three months. Pt is retired and lives alone. Pt reports low back pain all the time with the occasional radiating symptoms into BLE with the left being more symptomatic. Radiating pain does not pass the knee, however occasional tingling sensation of both feet is reported- independent of radiating lower extremity pain. Pt reports pain in low back is aggravated with standing, walking, squatting, and doing activities on knees. Pt also reports concerns for balance not being good although he denies any recent falls. Pt voices willingness to comply with physical therapy to see if there is anything we can do to help with his symptoms  but understands he is aging.   PAIN:  Are you having pain? Yes: NPRS scale: 6/10 Pain location: lumbar spine, bilateral LE stopping at the knees Pain description: burning, radiates down legs worse on left  Aggravating factors: walking, bending over, squatting, getting on knees Relieving factors: resting, laying down, pain medications  PRECAUTIONS: Fall WEIGHT BEARING RESTRICTIONS: No FALLS:  Has patient fallen in last 6 months? No  LIVING ENVIRONMENT: Lives with: lives alone Lives in: House/apartment Stairs: Yes: Internal: 13 steps;   and External: 2 steps;   Has following equipment at home: None  OCCUPATION: Retired  PLOF: Independent and Independent with basic ADLs  PATIENT GOALS: Pain reduction, improve functional mobility, improve balance  OBJECTIVE:     12/10/21   Functional Gait  Assessment  Gait assessed  Yes  Gait Level Surface 1  Change in Gait Speed 2  Gait with Horizontal Head Turns 1  Gait with Vertical Head Turns 2  Gait and Pivot Turn 2  Step Over Obstacle 2  Gait with Narrow Base of Support 2  Gait with Eyes Closed 1  Ambulating Backwards 2  Steps 2  Total Score 17     DIAGNOSTIC FINDINGS:  CLINICAL DATA:  Intermittent low back pain with bilateral radiculopathy, left greater than right   EXAM: MRI LUMBAR SPINE WITHOUT CONTRAST    IMPRESSION: 1. Mild lower lumbar spondylosis with mild narrowing of the left neural foramina at L3-4, L4-5, and L5-S1. 2. Known abdominal aortic aneurysm status post endovascular repair, poorly characterized.  PATIENT SURVEYS:  FOTO 51/57  COGNITION: Overall cognitive status: Within functional limits for tasks assessed   SENSATION: WFL  LUMBAR ROM:  AROM eval  Flexion WFL, pain  Extension WFL  Right lateral flexion WFL, pain with OP  Left lateral flexion WFL  Right rotation WFL  Left rotation WFL   (Blank rows = not tested)  LOWER EXTREMITY ROM:     Active  Right eval Left eval  Hip flexion WFL  pain with OP WFL pain with OP  Hip extension 25% limited ilat  Hip internal rotation 50% limited   (Blank rows = not tested)  LOWER EXTREMITY MMT:    MMT Right eval Left eval  Hip flexion 4 4  Hip extension 3 3  Hip abduction 4 4  Hip adduction 4 4   (Blank rows = not tested)  FUNCTIONAL TESTS:  5 times sit to stand: 23.31 seconds 10 meter walk test: self selected speed: 12.53 seconds (0.80 m/s); fastest speed: 9.55 seconds 1.04 m/s)   Today's Treatment Nustep seat  14 UE 17 L5 for gentle mobility and strengthening Alt thread the needle x12 with cuing for breath control Bird Dog x12 with good carry over of demo and initial cuing for technique Bridge 2x 12 with glute set prior for each lift with good carry over Attempted deadbug with theraball and UE only with patient unable to activate TA/core in order to prevent excessive lumbar ext causing pain; ceased TA marching 2x 12 with decent carry over of cuing for TA activation OG amb 533f 3# AW cued for fastest speed for straight away (approx 364f recovery for turn (approx 2017fwith good maintained balance with speed changes Hip hike from 3in step for TC of lumbopelvic dissociation x15 bilat with mutlimodal cuing for lumbopelvic dissociation   PATIENT EDUCATION:  Education details: Patient was educated on diagnosis, anatomy and pathology involved, prognosis, role of PT, and was given an HEP, demonstrating exercise with proper form following verbal and tactile cues, and was given a paper hand out to continue exercise at home. Pt was educated on and agreed to plan of care.  Person educated: Patient Education method: ExpConsulting civil engineeremonstration, Verbal cues, and Handouts Education comprehension: verbalized understanding, returned demonstration, and verbal cues required  HOME EXERCISE PROGRAM: Standing Lumbar Extensions 12-20 reps, once every hour daily Supine hip flexor stretch, 30 second holds, 2 reps, 3 times per day Supine or  seated Hamstring stretch, 30 second holds, 2 reps, 3 times per day  ASSESSMENT:  CLINICAL IMPRESSION: PT continued therapeutic exercises to address deficits in core and bilateral LE strength,dynamic balance, and lumbopelvic dissociation with success. Patient is able to comply with all cuing for proper technique of therex with cuing needed for lumbopelvic dissociation and glute max activation. PT will continue progression as able.      OBJECTIVE IMPAIRMENTS: Abnormal gait, decreased activity tolerance, decreased balance, decreased endurance, decreased mobility, difficulty walking, decreased ROM, decreased strength, hypomobility, and pain.   ACTIVITY LIMITATIONS: carrying, lifting, bending, sitting, squatting, and stairs  PARTICIPATION LIMITATIONS: cleaning, community activity, and yard work  PERSONAL FACTORS: Age and Fitness are also affecting patient's functional outcome.   REHAB POTENTIAL: Good  CLINICAL DECISION MAKING: Evolving/moderate complexity  EVALUATION COMPLEXITY: Moderate   GOALS: Goals reviewed with patient? No  SHORT TERM GOALS: Target date: 01/03/2023  Pt will be independent with HEP in order to improve strength and balance in order to decrease fall risk and improve function at home and community.  Baseline: 12/05/2021 HEP given Goal status: INITIAL  LONG TERM GOALS: Target date: 01/30/2021  Patient will increase FOTO score to 57 to demonstrate predicted increase in functional mobility to complete ADLs.  Baseline: 12/05/2021: 51 Goal status: INITIAL  2.  Patient will report a decrease in resting pain level to at least 3/10 pain for improved QoL. Baseline: 12/05/2021 Goal status: INITIAL  3.  Patient will increase self selected gait speed to 1.61m/40mor improved safety and independence during community ambulation. Baseline: 12/06/2021: 0.33m/s32mal status: INITIAL  4.  Pt will improve 5TSTS time to at least 19 seconds for increased bilateral LE strength  and decreased fall risk. Baseline: 12/06/2021: 23.31 seconds Goal status: INITIAL PLAN:  PT FREQUENCY: 1-2x/week  PT DURATION: 8 weeks  PLANNED INTERVENTIONS: Therapeutic exercises, Therapeutic activity, Neuromuscular re-education, Balance training, Gait training, Patient/Family education, Self Care, Joint mobilization, and Manual therapy.  PLAN FOR NEXT SESSION:  continue POC  [place] ChelsDurwin Reges1/04/2022, 11:34 AM

## 2022-01-14 ENCOUNTER — Ambulatory Visit: Payer: Medicare HMO | Admitting: Physical Therapy

## 2022-01-14 ENCOUNTER — Encounter: Payer: Self-pay | Admitting: Physical Therapy

## 2022-01-14 DIAGNOSIS — M6281 Muscle weakness (generalized): Secondary | ICD-10-CM

## 2022-01-14 DIAGNOSIS — M5459 Other low back pain: Secondary | ICD-10-CM | POA: Diagnosis not present

## 2022-01-14 NOTE — Therapy (Signed)
OUTPATIENT PHYSICAL THERAPY THORACOLUMBAR TREATMENT/DC Summary  Reporting Period 12/05/21 - 01/14/22   Patient Name: Martin Adkins MRN: [id number] DOB:03-16-1945, 77 y.o., male Today's Date: 01/14/2022  END OF SESSION:  PT End of Session - 01/14/22 1042     Visit Number 6    Number of Visits 24    Date for PT Re-Evaluation 02/02/21    Authorization Type [employer] PPO: cert for 64/40/34-7/42/59    Authorization Time Period 12 visits 12/10/21-02/06/22    Authorization - Visit Number 6    Authorization - Number of Visits 12    Progress Note Due on Visit 10    PT Start Time 1045    PT Stop Time 1116    PT Time Calculation (min) 31 min    Equipment Utilized During Treatment Gait belt    Activity Tolerance Patient tolerated treatment well    Behavior During Therapy WFL for tasks assessed/performed                 Past Medical History:  Diagnosis Date   AAA (abdominal aortic aneurysm) without rupture ([place])    a.) [place] 02/09/04 - 3.1 x 3.4 cm. b.) [place] 02/14/05 - 3.62 x 3.63 cm. c.) [place] 02/25/06 - 3.97 x 4.0 cm. d.) CT 07/09/11 - 4.9 cm. e.) s/p EVAR 2013. f.) enlarging AAA --> back to the OR on 09/24/2017 for placement of a 27 mm x 12 cm RIGHT iliac extension limb down to distal CIA.   Aortic atherosclerosis (HCC)    APS (antiphospholipid syndrome) (HCC)    Arthritis    Atrial fibrillation (HCC)    a.) CHA2DS2-VASc Score = 6 (age, CHF, HTN, prior DVT x 2, prior MI). b.) on rivaroxaban   B12 deficiency 03/21/2017   Barrett's esophagus    BPH (benign prostatic hyperplasia)    CAD (coronary artery disease)    a.) LHC 11/14/95 --> 95% mLCx, 95% OM1; PTCA. b.) 4v CABG 2004 --> LIMA-LAD, SVG-D1,RCA,OM. c.) NSTEMI 05/02/2013 --> PCI with DES x 3 to SVG-OM in 2 distinct areas with filter. d.) Inf STEMI 07/30/14 - LHC --> 50% LM, 90% mLCx, 100% OM2, 100% RPDA; LIMA-LAD patent; 100% occ of SVG OM2,OM3,PDA; med mgmt. e.) LHC 01/19/18 - patent LIMA-LAD; other grafts occ; not  amenable to PCI/further bypass.   Cardiomyopathy ([place])    Chronic anticoagulation    a.) Rivaroxaban   CLL (chronic lymphocytic leukemia) ([place]) 05/24/2017   DVT (deep venous thrombosis) (HCC)    GERD (gastroesophageal reflux disease)    HFrEF (heart failure with reduced ejection fraction) ([place])    a.) TTE 02/22/2020 --> mod. LV systolic dysfunction; LVEF 35-40%.   History of hiatal hernia    History of shingles 2013   Hyperlipemia    Hyperplastic polyps of stomach    a.) Bx on 07/24/2020 --> well differentiated NET; WHO grade I.   Hypertension    NSTEMI (non-ST elevated myocardial infarction) ([place]) 05/02/2013   a.) LHC 05/03/2013 --> 50% LM, 100% LAD, 75% mLCx, 100% dLCx, 60% mRCA; patient LIMA-LAD, occluded SVG-D1 and SVG-PDA. SVG-OM with extensive thrombus and distal 99% lesion. HIGH RISK PCI performed at [facility] with placement of DES x 3 to 2 distinct areas of SVG with filter protection.   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 No intervention; medical management.   Valvular regurgitation    a.) TTE 02/22/2020 --> LVEF 35-40%; mild panvalvular.   Past Surgical History:  Procedure Laterality Date   ABDOMINAL AORTA STENT     BIOPSY  11/05/2021   Procedure: BIOPSY;  Surgeon: Irving Copas., MD;  Location: [facility];  Service: Gastroenterology;;   CHOLECYSTECTOMY     COLONOSCOPY  11/24/2009    COLONOSCOPY N/A 10/09/2020   Procedure: COLONOSCOPY;  Surgeon: Irving Copas., MD;  Location: [facility];  Service: Gastroenterology;  Laterality: N/A;   COLONOSCOPY WITH PROPOFOL N/A 07/24/2020   Procedure: COLONOSCOPY WITH PROPOFOL;  Surgeon: Toledo, Benay Pike, MD;  Location: [facility];  Service: Gastroenterology;  Laterality: N/A;   CORONARY ANGIOPLASTY WITH STENT PLACEMENT Left 05/03/2013   Procedure: CORONARY ANGIOPLASTY WITH STENT PLACEMENT (DES x 3 to SVG-OM graft); Location: [facility]   CORONARY ARTERY BYPASS GRAFT N/A 01/21/2002   Procedure: 4V CABG (LIMA-LAD, SVG-D1, SVG-RCA, SVG-OM); Location: [facility]   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   Procedure: DUPUYTREN CONTRACTURE RELEASE LEFT LONG FINGER;  Surgeon: Corky Mull, MD;  Location: [facility];  Service: Orthopedics;  Laterality: Left;   DUPUYTREN CONTRACTURE RELEASE Right 10/18/2020   Procedure: DUPUYTREN CONTRACTURE RELEASE;  Surgeon: Corky Mull, MD;  Location: [facility];  Service: Orthopedics;  Laterality: Right;   ENDOVASCULAR REPAIR/STENT GRAFT N/A 09/24/2017   Procedure: ENDOVASCULAR REPAIR/STENT GRAFT (27 mm x 12 cm RIGHT iliac limb extension to distal CIA);  Surgeon: Algernon Huxley, MD;  Location: [facility];  Service: Cardiovascular;  Laterality: N/A;   ESOPHAGOGASTRODUODENOSCOPY  05/26/02  03/23/12   ESOPHAGOGASTRODUODENOSCOPY (EGD) WITH PROPOFOL N/A 10/28/2016   Procedure: ESOPHAGOGASTRODUODENOSCOPY (EGD) WITH PROPOFOL;  Surgeon: Manya Silvas, MD;  Location: [facility];  Service: Endoscopy;  Laterality: N/A;   ESOPHAGOGASTRODUODENOSCOPY (EGD) WITH PROPOFOL N/A 11/05/2021   Procedure: ESOPHAGOGASTRODUODENOSCOPY (EGD) WITH PROPOFOL;  Surgeon: Rush Landmark Telford Nab., MD;  Location: [facility];  Service: Gastroenterology;  Laterality: N/A;   EUS N/A 10/09/2020   Procedure: LOWER ENDOSCOPIC ULTRASOUND (EUS);  Surgeon: Irving Copas., MD;  Location: [facility];  Service:  Gastroenterology;  Laterality: N/A;   EUS N/A 11/05/2021   Procedure: LOWER ENDOSCOPIC ULTRASOUND (EUS);  Surgeon: Irving Copas., MD;  Location: [facility];  Service: Gastroenterology;  Laterality: N/A;   FLEXIBLE SIGMOIDOSCOPY N/A 11/05/2021   Procedure: FLEXIBLE SIGMOIDOSCOPY;  Surgeon: Rush Landmark Telford Nab., MD;  Location: [facility];  Service: Gastroenterology;  Laterality: N/A;   HEMOSTASIS CLIP PLACEMENT  11/05/2021   Procedure: HEMOSTASIS CLIP PLACEMENT;  Surgeon: Irving Copas., MD;  Location: [facility];  Service: Gastroenterology;;   INCISION AND DRAINAGE ABSCESS N/A 06/14/2020   Procedure: INCISION AND DRAINAGE ABSCESS;  Surgeon: Herbert Pun, MD;  Location: [facility];  Service: General;  Laterality: N/A;   INGUINAL HERNIA REPAIR Left    LEFT HEART CATH AND CORONARY ANGIOGRAPHY N/A 01/19/2018   Procedure: LEFT HEART CATH AND CORONARY ANGIOGRAPHY;  Surgeon: Teodoro Spray, MD;  Location: [facility];  Service: Cardiovascular;  Laterality: N/A;   LEFT HEART CATH AND CORONARY ANGIOGRAPHY Left 11/14/1995   Procedure: CARDIAC CATHETERIZATION (PTCA); Location: [facility]; Surgeon: Doristine Bosworth, MD   LEFT HEART CATH AND CORS/GRAFTS ANGIOGRAPHY Left 07/30/2014   Procedure: CARDIAC CATHETERIZATION; Location: [facility]   PERIPHERAL VASCULAR CATHETERIZATION N/A 09/06/2014   Procedure: IVC Filter Removal;  Surgeon: Katha Cabal, MD;  Location: [facility];  Service: Cardiovascular;  Laterality: N/A;   POLYPECTOMY  10/09/2020   Procedure: POLYPECTOMY;  Surgeon: Rush Landmark Telford Nab., MD;  Location: [facility];  Service: Gastroenterology;;   POLYPECTOMY  11/05/2021   Procedure: POLYPECTOMY;  Surgeon: Irving Copas., MD;  Location: [facility];  Service: Gastroenterology;;   [place] INJECTION  10/09/2020   Procedure: SUBMUCOSAL LIFTING INJECTION;  Surgeon: Irving Copas., MD;  Location: [facility];  Service:  Gastroenterology;;   [place] INJECTION  11/05/2021   Procedure: SUBMUCOSAL TATTOO INJECTION;  Surgeon: Irving Copas., MD;  Location: [facility];  Service: Gastroenterology;;   TRANSURETHRAL RESECTION OF PROSTATE N/A 02/20/2015   Procedure: TRANSURETHRAL RESECTION OF THE PROSTATE (TURP);  Surgeon: Hollice Espy, MD;  Location: [facility];  Service: Urology;  Laterality: N/A;   Patient Active Problem List   Diagnosis Date Noted   Carotid stenosis 09/26/2020   Primary neuroendocrine carcinoma of rectum ([place]) 08/22/2020   Dupuytren's contracture of left hand 11/02/2018   Sepsis ([place]) 08/27/2018   Non-ST elevation MI (NSTEMI) ([place]) 01/18/2018   AAA (abdominal aortic aneurysm) ([place]) 09/24/2017   Leaking abdominal aortic aneurysm (AAA) ([place]) 08/22/2017   CLL (chronic lymphocytic leukemia) ([place]) 05/24/2017   Goals of care, counseling/discussion 05/24/2017   B12 deficiency 03/21/2017   Elevated troponin I level 03/03/2015   Deep venous thrombosis of profunda femoris vein ([place]) 01/23/2015   BPH (benign prostatic hyperplasia) 01/23/2015   Urge urinary incontinence 01/23/2015   Type 2 diabetes mellitus ([place]) 10/31/2014   Benign gastric polyp 10/31/2014   Absence of bladder continence 10/09/2014   Acute non-ST elevation myocardial infarction (NSTEMI) ([place]) 08/15/2014   Atrial fibrillation ([place]) 08/05/2014   Antiphospholipid syndrome ([place]) 08/01/2014   H/O deep venous thrombosis 08/01/2014   Non-ST elevation myocardial infarction (NSTEMI), subendocardial infarction, subsequent episode of care ([place]) 07/30/2014   Barrett esophagus 07/28/2014   Diabetes mellitus, type 2 ([place]) 07/28/2014   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/28/2014   Hyperplastic adenomatous polyp of stomach 07/28/2014   Benign essential HTN 54/62/7035   Chronic systolic heart failure ([place]) 04/19/2014   AAA (abdominal aortic aneurysm) without rupture ([place]) 04/05/2014   Arteriosclerosis of coronary artery  04/05/2014   Breathlessness on exertion 04/05/2014   Abdominal aortic aneurysm (AAA) without rupture ([place]) 04/05/2014   Acute non-ST segment elevation myocardial infarction ([place]) 05/14/2013   Acid reflux 05/04/2013   Combined fat and carbohydrate induced hyperlipemia 05/03/2013   Obstructive apnea 05/03/2013   Arthritis, degenerative 05/03/2013   Peripheral blood vessel disorder ([place]) 05/03/2013   Arthritis or polyarthritis, rheumatoid ([place]) 05/03/2013   Compulsive tobacco user syndrome 05/03/2013   Rheumatoid arthritis ([place]) 05/03/2013   Peripheral vascular disease ([place]) 05/03/2013   Current tobacco use 05/03/2013   PCP: [name], MD REFERRING PROVIDER: Meeler, Sherren Kerns, FNP REFERRING DIAG: DDD Lumbar radiculitis Rationale for Evaluation and Treatment: Rehabilitation THERAPY DIAG:  Other low back pain  Muscle weakness (generalized) ONSET DATE: 4 years ago SUBJECTIVE:  SUBJECTIVE STATEMENT: Pt reports little pain this weekend. completing HEP, is ready to d/c to home HEP  PERTINENT HISTORY:  Patient is a 77 year old male presenting with low back pain. Pt reports symptoms initiated about 4 years ago and has been receiving injections for over a year now every three months. Pt is retired and lives alone. Pt reports low back pain all the time with the occasional radiating symptoms into BLE with the left being more symptomatic. Radiating pain does not pass the knee, however occasional tingling sensation of both feet is reported- independent of radiating lower extremity pain. Pt reports pain in low back is aggravated with standing, walking, squatting, and doing activities on knees. Pt also reports concerns for balance not being good although he denies any recent falls. Pt voices willingness to comply  with physical therapy to see if there is anything we can do to help with his symptoms but understands he is aging.   PAIN:  Are you having pain? Yes: NPRS scale: 6/10 Pain location: lumbar spine, bilateral LE stopping at the knees Pain description: burning, radiates down legs worse on left  Aggravating factors: walking, bending over, squatting, getting on knees Relieving factors: resting, laying down, pain medications  PRECAUTIONS: Fall WEIGHT BEARING RESTRICTIONS: No FALLS:  Has patient fallen in last 6 months? No  LIVING ENVIRONMENT: Lives with: lives alone Lives in: House/apartment Stairs: Yes: Internal: 13 steps;   and External: 2 steps;   Has following equipment at home: None  OCCUPATION: Retired  PLOF: Independent and Independent with basic ADLs  PATIENT GOALS: Pain reduction, improve functional mobility, improve balance  OBJECTIVE:     12/10/21   Functional Gait  Assessment  Gait assessed  Yes  Gait Level Surface 1  Change in Gait Speed 2  Gait with Horizontal Head Turns 1  Gait with Vertical Head Turns 2  Gait and Pivot Turn 2  Step Over Obstacle 2  Gait with Narrow Base of Support 2  Gait with Eyes Closed 1  Ambulating Backwards 2  Steps 2  Total Score 17     DIAGNOSTIC FINDINGS:  CLINICAL DATA:  Intermittent low back pain with bilateral radiculopathy, left greater than right   EXAM: MRI LUMBAR SPINE WITHOUT CONTRAST    IMPRESSION: 1. Mild lower lumbar spondylosis with mild narrowing of the left neural foramina at L3-4, L4-5, and L5-S1. 2. Known abdominal aortic aneurysm status post endovascular repair, poorly characterized.  PATIENT SURVEYS:  FOTO 51/57  COGNITION: Overall cognitive status: Within functional limits for tasks assessed   SENSATION: WFL  LUMBAR ROM:  AROM eval  Flexion WFL, pain  Extension WFL  Right lateral flexion WFL, pain with OP  Left lateral flexion WFL  Right rotation WFL  Left rotation WFL   (Blank rows = not  tested)  LOWER EXTREMITY ROM:     Active  Right eval Left eval  Hip flexion WFL pain with OP WFL pain with OP  Hip extension 25% limited ilat  Hip internal rotation 50% limited   (Blank rows = not tested)  LOWER EXTREMITY MMT:    MMT Right eval Left eval  Hip flexion 4 4  Hip extension 3 3  Hip abduction 4 4  Hip adduction 4 4   (Blank rows = not tested)  FUNCTIONAL TESTS:  5 times sit to stand: 23.31 seconds 10 meter walk test: self selected speed: 12.53 seconds (0.80 m/s); fastest speed: 9.55 seconds 1.04 m/s)   Today's Treatment Nustep seat 14  UE 17 L5 for gentle mobility and strengthening 5xSTS 2 trials  PT reviewed the following HEP with patient with patient able to demonstrate a set of the following with min cuing for correction needed. PT educated patient on parameters of therex (how/when to inc/decrease intensity, frequency, rep/set range, stretch hold time, and purpose of therex) with verbalized understanding.  Access Code: [email or web address]  - Bird Dog  - 1-2 x weekly - 3 sets - 8-12 reps - Squat with Chair Touch  - 1-2 x weekly - 3 sets - 8-12 reps - Standing Hip Hinge  - 1-2 x weekly - 3 sets - 8-12 reps - Full Leg Press  - 1-2 x weekly - 3 sets - 8-12 reps - Hip Abduction with Resistance Loop  - 1-2 x weekly - 3 sets - 8-12 reps  Verbal review of: Standing Lumbar Extensions 12-20 reps, once every hour daily Supine hip flexor stretch, 30 second holds, 2 reps, 3 times per day Supine or seated Hamstring stretch, 30 second holds, 2 reps, 3 times per day    PATIENT EDUCATION:  Education details: Patient was educated on diagnosis, anatomy and pathology involved, prognosis, role of PT, and was given an HEP, demonstrating exercise with proper form following verbal and tactile cues, and was given a paper hand out to continue exercise at home. Pt was educated on and agreed to plan of care.  Person educated: Patient Education method: Consulting civil engineer, Demonstration, Verbal  cues, and Handouts Education comprehension: verbalized understanding, returned demonstration, and verbal cues required  HOME EXERCISE PROGRAM: Standing Lumbar Extensions 12-20 reps, once every hour daily Supine hip flexor stretch, 30 second holds, 2 reps, 3 times per day Supine or seated Hamstring stretch, 30 second holds, 2 reps, 3 times per day  ASSESSMENT:  CLINICAL IMPRESSION: PT reassessed goals this session where patient has met all goals to safely d/c formal PT. Patient is able to demonstrate and verbalize understanding of all HEP recommendations with minimal corrections needed. Pt given clinic contact info should further questions or concerns arise. Pt to d/c PT.       OBJECTIVE IMPAIRMENTS: Abnormal gait, decreased activity tolerance, decreased balance, decreased endurance, decreased mobility, difficulty walking, decreased ROM, decreased strength, hypomobility, and pain.   ACTIVITY LIMITATIONS: carrying, lifting, bending, sitting, squatting, and stairs  PARTICIPATION LIMITATIONS: cleaning, community activity, and yard work  PERSONAL FACTORS: Age and Fitness are also affecting patient's functional outcome.   REHAB POTENTIAL: Good  CLINICAL DECISION MAKING: Evolving/moderate complexity  EVALUATION COMPLEXITY: Moderate   GOALS: Goals reviewed with patient? No  SHORT TERM GOALS: Target date: 01/03/2023  Pt will be independent with HEP in order to improve strength and balance in order to decrease fall risk and improve function at home and community.  Baseline: 12/05/2021 HEP given; 01/14/22 HEP updated Goal status: MET  LONG TERM GOALS: Target date: 01/30/2021  Patient will increase FOTO score to 57 to demonstrate predicted increase in functional mobility to complete ADLs.  Baseline: 12/05/2021: 51; 01/14/22 61 Goal status: MET  2.  Patient will report a decrease in resting pain level to at least 3/10 pain for improved QoL. Baseline: 12/05/2021 8/10 1/8/236/10 Goal  status: NOT MET  3.  Patient will increase self selected gait speed to 1.86ms for improved safety and independence during community ambulation. Baseline: 12/06/2021: 0.828m; 01/14/21 self selected: 1.13m15mfastest 1.41m113moal status: MET  4.  Pt will improve 5TSTS time to at least 19 seconds for increased bilateral LE strength and decreased fall risk. Baseline:  12/06/2021: 23.31 seconds; 01/14/22 13sec Goal status: MET PLAN:  PT FREQUENCY: 1-2x/week  PT DURATION: 8 weeks  PLANNED INTERVENTIONS: Therapeutic exercises, Therapeutic activity, Neuromuscular re-education, Balance training, Gait training, Patient/Family education, Self Care, Joint mobilization, and Manual therapy.  PLAN FOR NEXT SESSION:  continue POC   [facility] DPT  Durwin Reges, PT 01/14/2022, 11:16 AM

## 2022-01-16 ENCOUNTER — Ambulatory Visit: Payer: Medicare HMO | Admitting: Physical Therapy

## 2022-03-05 ENCOUNTER — Inpatient Hospital Stay: Payer: Medicare HMO | Attending: Oncology

## 2022-03-05 DIAGNOSIS — C7A8 Other malignant neuroendocrine tumors: Secondary | ICD-10-CM

## 2022-03-05 DIAGNOSIS — R978 Other abnormal tumor markers: Secondary | ICD-10-CM | POA: Diagnosis not present

## 2022-03-05 DIAGNOSIS — C20 Malignant neoplasm of rectum: Secondary | ICD-10-CM | POA: Diagnosis not present

## 2022-03-05 DIAGNOSIS — C9110 Chronic lymphocytic leukemia of B-cell type not having achieved remission: Secondary | ICD-10-CM | POA: Insufficient documentation

## 2022-03-05 LAB — CBC WITH DIFFERENTIAL/PLATELET
Abs Immature Granulocytes: 0.02 10*3/uL (ref 0.00–0.07)
Basophils Absolute: 0.1 10*3/uL (ref 0.0–0.1)
Basophils Relative: 1 %
Eosinophils Absolute: 0.1 10*3/uL (ref 0.0–0.5)
Eosinophils Relative: 1 %
HCT: 49.3 % (ref 39.0–52.0)
Hemoglobin: 15.6 g/dL (ref 13.0–17.0)
Immature Granulocytes: 0 %
Lymphocytes Relative: 58 %
Lymphs Abs: 5.2 10*3/uL — ABNORMAL HIGH (ref 0.7–4.0)
MCH: 28.6 pg (ref 26.0–34.0)
MCHC: 31.6 g/dL (ref 30.0–36.0)
MCV: 90.3 fL (ref 80.0–100.0)
Monocytes Absolute: 0.8 10*3/uL (ref 0.1–1.0)
Monocytes Relative: 8 %
Neutro Abs: 2.9 10*3/uL (ref 1.7–7.7)
Neutrophils Relative %: 32 %
Platelets: 136 10*3/uL — ABNORMAL LOW (ref 150–400)
RBC: 5.46 MIL/uL (ref 4.22–5.81)
RDW: 15.3 % (ref 11.5–15.5)
Smear Review: NORMAL
WBC: 9.1 10*3/uL (ref 4.0–10.5)
nRBC: 0 % (ref 0.0–0.2)

## 2022-03-05 LAB — COMPREHENSIVE METABOLIC PANEL
ALT: 31 U/L (ref 0–44)
AST: 36 U/L (ref 15–41)
Albumin: 4.6 g/dL (ref 3.5–5.0)
Alkaline Phosphatase: 99 U/L (ref 38–126)
Anion gap: 8 (ref 5–15)
BUN: 14 mg/dL (ref 8–23)
CO2: 27 mmol/L (ref 22–32)
Calcium: 9.1 mg/dL (ref 8.9–10.3)
Chloride: 103 mmol/L (ref 98–111)
Creatinine, Ser: 0.88 mg/dL (ref 0.61–1.24)
GFR, Estimated: >60 mL/min (ref >60)
Glucose, Bld: 129 mg/dL — ABNORMAL HIGH (ref 70–99)
Potassium: 3.4 mmol/L — ABNORMAL LOW (ref 3.5–5.1)
Sodium: 138 mmol/L (ref 135–145)
Total Bilirubin: 1.2 mg/dL (ref 0.3–1.2)
Total Protein: 7.8 g/dL (ref 6.5–8.1)

## 2022-03-05 LAB — LACTATE DEHYDROGENASE: LDH: 156 U/L (ref 98–192)

## 2022-03-07 LAB — COMP PANEL: LEUKEMIA/LYMPHOMA: Immunophenotypic Profile: 43

## 2022-03-07 LAB — CHROMOGRANIN A: Chromogranin A (ng/mL): 214.1 ng/mL — ABNORMAL HIGH (ref 0.0–101.8)

## 2022-03-08 ENCOUNTER — Inpatient Hospital Stay: Payer: Medicare HMO | Attending: Oncology | Admitting: Oncology

## 2022-03-08 ENCOUNTER — Encounter: Payer: Self-pay | Admitting: Oncology

## 2022-03-08 VITALS — BP 145/76 | HR 58 | Temp 96.7°F | Resp 18 | Wt 219.9 lb

## 2022-03-08 DIAGNOSIS — Z7982 Long term (current) use of aspirin: Secondary | ICD-10-CM | POA: Diagnosis not present

## 2022-03-08 DIAGNOSIS — I2510 Atherosclerotic heart disease of native coronary artery without angina pectoris: Secondary | ICD-10-CM | POA: Diagnosis not present

## 2022-03-08 DIAGNOSIS — Z8719 Personal history of other diseases of the digestive system: Secondary | ICD-10-CM | POA: Diagnosis not present

## 2022-03-08 DIAGNOSIS — E785 Hyperlipidemia, unspecified: Secondary | ICD-10-CM | POA: Insufficient documentation

## 2022-03-08 DIAGNOSIS — Z86718 Personal history of other venous thrombosis and embolism: Secondary | ICD-10-CM | POA: Insufficient documentation

## 2022-03-08 DIAGNOSIS — I252 Old myocardial infarction: Secondary | ICD-10-CM | POA: Insufficient documentation

## 2022-03-08 DIAGNOSIS — I4811 Longstanding persistent atrial fibrillation: Secondary | ICD-10-CM | POA: Diagnosis not present

## 2022-03-08 DIAGNOSIS — I5022 Chronic systolic (congestive) heart failure: Secondary | ICD-10-CM | POA: Diagnosis not present

## 2022-03-08 DIAGNOSIS — R161 Splenomegaly, not elsewhere classified: Secondary | ICD-10-CM | POA: Diagnosis not present

## 2022-03-08 DIAGNOSIS — D125 Benign neoplasm of sigmoid colon: Secondary | ICD-10-CM | POA: Insufficient documentation

## 2022-03-08 DIAGNOSIS — Z7901 Long term (current) use of anticoagulants: Secondary | ICD-10-CM | POA: Diagnosis not present

## 2022-03-08 DIAGNOSIS — I429 Cardiomyopathy, unspecified: Secondary | ICD-10-CM | POA: Insufficient documentation

## 2022-03-08 DIAGNOSIS — Z79899 Other long term (current) drug therapy: Secondary | ICD-10-CM | POA: Diagnosis not present

## 2022-03-08 DIAGNOSIS — N400 Enlarged prostate without lower urinary tract symptoms: Secondary | ICD-10-CM | POA: Diagnosis not present

## 2022-03-08 DIAGNOSIS — M069 Rheumatoid arthritis, unspecified: Secondary | ICD-10-CM | POA: Insufficient documentation

## 2022-03-08 DIAGNOSIS — C20 Malignant neoplasm of rectum: Secondary | ICD-10-CM | POA: Insufficient documentation

## 2022-03-08 DIAGNOSIS — C9110 Chronic lymphocytic leukemia of B-cell type not having achieved remission: Secondary | ICD-10-CM | POA: Diagnosis not present

## 2022-03-08 DIAGNOSIS — I4891 Unspecified atrial fibrillation: Secondary | ICD-10-CM | POA: Diagnosis not present

## 2022-03-08 DIAGNOSIS — G4733 Obstructive sleep apnea (adult) (pediatric): Secondary | ICD-10-CM | POA: Insufficient documentation

## 2022-03-08 DIAGNOSIS — E538 Deficiency of other specified B group vitamins: Secondary | ICD-10-CM | POA: Diagnosis not present

## 2022-03-08 DIAGNOSIS — C7A8 Other malignant neuroendocrine tumors: Secondary | ICD-10-CM | POA: Diagnosis not present

## 2022-03-08 DIAGNOSIS — I110 Hypertensive heart disease with heart failure: Secondary | ICD-10-CM | POA: Diagnosis not present

## 2022-03-08 DIAGNOSIS — K219 Gastro-esophageal reflux disease without esophagitis: Secondary | ICD-10-CM | POA: Diagnosis not present

## 2022-03-08 NOTE — Assessment & Plan Note (Signed)
on chronic anticoagulation with Xarelto. Follow-up with cardiology.

## 2022-03-08 NOTE — Assessment & Plan Note (Addendum)
#  CLL, stable.  Patient has been in remission.  Off treatment since November 2019. Lymphocytosis 4.7, Early recurrence. He is asymptomatic without constitutional symptoms.. Continue watchful observation.

## 2022-03-08 NOTE — Progress Notes (Signed)
Hematology/Oncology Progress note [phone] [phone]   REASON FOR VISIT Follow up for management of CLL, Rectal NET  ASSESSMENT & PLAN:   Atrial fibrillation ([place]) on chronic anticoagulation with Xarelto. Follow-up with cardiology.   CLL (chronic lymphocytic leukemia) (HCC) #CLL, stable.  Patient has been in remission.  Off treatment since November 2019. Lymphocytosis 4.7, Early recurrence. He is asymptomatic without constitutional symptoms.. Continue watchful observation.   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  I clarified with radiology Dr. Jobe Igo.  An addendum was added that the T staging [ post resection] should be T0, no muscularis propia invasion.  10/2020 Flex sigmoidoscopy by Dr.Mansouraty did not reveal significant pathology at the rectosigmoid junction.  Flex sigmoidoscopy in Oct 2023-report not available to me. Check CT abdomen pelvis with contrast annually-September 2024.  Elevated chromogranin A is likely secondary to Protonix use.  He will stop Protonix for 2 weeks and repeat chromogranin A level.  Orders Placed This Encounter  Procedures   CT Abdomen Pelvis W Contrast    Standing Status:   Future    Standing Expiration Date:   03/08/2023    Order Specific Question:   If indicated for the ordered procedure, I authorize the administration of contrast media per Radiology protocol    Answer:   Yes    Order Specific Question:   Does the patient have a contrast media/X-ray dye allergy?    Answer:   No    Order Specific Question:   Preferred imaging location?    Answer:   [facility]    Order Specific Question:   Is Oral Contrast requested for this exam?    Answer:   Yes, Per Radiology protocol   Chromogranin A    Standing Status:   Future    Standing Expiration Date:   03/08/2023   CBC with  Differential ([place] Only)    Standing Status:   Future    Standing Expiration Date:   03/08/2023   CMP ([place] only)    Standing Status:   Future    Standing Expiration Date:   03/08/2023   Lactate dehydrogenase    Standing Status:   Future    Standing Expiration Date:   03/08/2023   Follow-up in 6 months. All questions were answered. The patient knows to call the clinic with any problems, questions or concerns.  Earlie Server, MD, PhD [facility] Hematology Oncology 03/08/2022    HISTORY OF PRESENTING ILLNESS:  Martin Adkins is a  77 y.o.  male with PMH listed below who was referred to me for evaluation of lymphocytosis.  Recent lab work on 03/05/2017 showed wbc 11.4, hb 14.6, platelet count 117,000, lymphocytosis 63.5%, neutrophil 22%, Report chronic fatigue, no weight loss, fever or chills.  He reports feeling chest "rattleness". Has for CT chest which showed acute finding, chronic finding includes  postsurgical changes consistent with coronary bypass grafting. One of the bypass grafts arising from the aorta shows apparent thrombosis and an area of distal aneurysmal dilatation which is also Thrombosed. Right adrenal adenoma stable in appearance. Mild scarring in the lung bases.Fatty liver.   Takes Aspirin '81mg'$ , coumadine, plavix. He is on anticoagulation for previous history of clots.  Denies any bleeding events. Use to drink hard liquir daily, quitted for 4-5 years. Current drinks wine on weekend.    Image Studies # 04/04/2017  US abdomen showed  1.  Splenomegaly.  No focal splenic lesions evident. 2. Increased liver echogenicity, a finding most likely indicative of hepatic steatosis. While no focal liver lesions are evident on this study, it must be cautioned that the sensitivity of ultrasound for detection of focal liver lesions is diminished in this circumstance.3. Pancreas obscured by gas. Portions of inferior vena cava obscured by gas.4.  Gallbladder absent.5. Status  post abdominal aortic aneurysm repair. No periaortic fluid.  # 04/21/2017 PET scan 1. Moderate splenomegaly with uniform splenic hypermetabolism,compatible with the provided history of lymphoproliferative disease.No additional hypermetabolic sites of lymphoproliferative disease. Specifically, no hypermetabolic lymphadenopathy Hepatic steatosis, aortic atherosclerosis. Aneurysm.    # CLL, stage IV disease given spelencemagly, thrombocytopenia, symptomatic with fatigue and weight loss.  CLL IPL score 4, high risk.   # status post abdominal aortic aneurysm repair on 09/25/2017 s/p cardiac cath which revealed patent LIMA to the LAD, no significant disease in the RCA with a 90% stenosis in a small RV marginal branch.  Occluded left circumflex, occluded LAD.  Saphenous vein grafts are all occluded.  RCA does not require grafting.  LAD is well grafted by the left internal mammary.  Medical management  07/24/2020, colonoscopy showed diverticulosis in the sigmoid colon.  Multiple polyps were removed from descending colon, proximal ascending colon, transverse colon, sigmoid colon and 5 small 2-10m polyps resected from rectum. Polyps from the colon are tubular adenoma, negative for high-grade dysplasia and malignancy. Polyps from rectum showed well differentiated neuroendocrine tumor, grade 1, involving the base of the submitted tissue-1 fragment, hyperplastic polyp 2 fragments.  10/09/2020 EUS by Dr.Mansouraty  FLEX Impression: Stool in the sigmoid colon and in the descending colon. - Preparation of the colon was fair even after significant lavage - however large stool began to come through the rest of the colon as procedure continued. - >30 1 to 5 mm polyps in the rectum, at the recto-sigmoid colon and in the sigmoid colon. 16 of the polyps which were in the rectum were removed with a cold snare. Resected and retrieved.- Non-bleeding non-thrombosed external and internal hemorrhoids  EUS Impression: -  Endosonographic imaging showed no sign of significant pathology at the rectosigmoid junction. - Endosonographic images of the rectum were unremarkable. - Endosonographic images of the perirectal space were unremarkable. - No malignant-appearing lymph nodes were visualized endosonographically in the perirectal region and in the left iliac region. - The internal anal sphincter was visualized endosonographically and appeared normal  Dr.Mansouraty recommend repeat 1 year Flex sigmoidoscopy with EUS  INTERVAL HISTORY Jodi JELDRIDGE RIELLYis a 77y.o. male who has above presents for follow-up of CLL, rectal neuroendocrine tumor. He has been off venetoclax since 11/11/2017. Patient denies unintentional weight loss, night sweats, fever.  Appetite is good. No new complaints. He takes protonix long term.   Review of Systems  Constitutional:  Negative for appetite change, chills, fatigue, fever and unexpected weight change.  HENT:   Negative for hearing loss and voice change.   Eyes:  Negative for eye problems and icterus.  Respiratory:  Positive for shortness of breath. Negative for chest tightness, cough and wheezing.   Cardiovascular:  Negative for chest pain and leg swelling.  Gastrointestinal:  Negative for abdominal distention and abdominal pain.  Endocrine: Negative for hot flashes.  Genitourinary:  Negative for difficulty urinating, dysuria and frequency.   Musculoskeletal:  Negative for arthralgias and back pain.  Skin:  Negative for itching and rash.  Neurological:  Positive for numbness. Negative for dizziness, headaches  and light-headedness.  Hematological:  Negative for adenopathy. Does not bruise/bleed easily.  Psychiatric/Behavioral:  Negative for confusion.      MEDICAL HISTORY:  Past Medical History:  Diagnosis Date   AAA (abdominal aortic aneurysm) without rupture ([place])    a.) [place] 02/09/04 - 3.1 x 3.4 cm. b.) [place] 02/14/05 - 3.62 x 3.63 cm. c.) [place] 02/25/06 - 3.97 x 4.0 cm. d.) CT  07/09/11 - 4.9 cm. e.) s/p EVAR 2013. f.) enlarging AAA --> back to the OR on 09/24/2017 for placement of a 27 mm x 12 cm RIGHT iliac extension limb down to distal CIA.   Aortic atherosclerosis (HCC)    APS (antiphospholipid syndrome) (HCC)    Arthritis    Atrial fibrillation (HCC)    a.) CHA2DS2-VASc Score = 6 (age, CHF, HTN, prior DVT x 2, prior MI). b.) on rivaroxaban   B12 deficiency 03/21/2017   Barrett's esophagus    BPH (benign prostatic hyperplasia)    CAD (coronary artery disease)    a.) LHC 11/14/95 --> 95% mLCx, 95% OM1; PTCA. b.) 4v CABG 2004 --> LIMA-LAD, SVG-D1,RCA,OM. c.) NSTEMI 05/02/2013 --> PCI with DES x 3 to SVG-OM in 2 distinct areas with filter. d.) Inf STEMI 07/30/14 - LHC --> 50% LM, 90% mLCx, 100% OM2, 100% RPDA; LIMA-LAD patent; 100% occ of SVG OM2,OM3,PDA; med mgmt. e.) LHC 01/19/18 - patent LIMA-LAD; other grafts occ; not amenable to PCI/further bypass.   Cardiomyopathy ([place])    Chronic anticoagulation    a.) Rivaroxaban   CLL (chronic lymphocytic leukemia) ([place]) 05/24/2017   DVT (deep venous thrombosis) (HCC)    GERD (gastroesophageal reflux disease)    HFrEF (heart failure with reduced ejection fraction) ([place])    a.) TTE 02/22/2020 --> mod. LV systolic dysfunction; LVEF 35-40%.   History of hiatal hernia    History of shingles 2013   Hyperlipemia    Hyperplastic polyps of stomach    a.) Bx on 07/24/2020 --> well differentiated NET; WHO grade I.   Hypertension    NSTEMI (non-ST elevated myocardial infarction) ([place]) 05/02/2013   a.) LHC 05/03/2013 --> 50% LM, 100% LAD, 75% mLCx, 100% dLCx, 60% mRCA; patient LIMA-LAD, occluded SVG-D1 and SVG-PDA. SVG-OM with extensive thrombus and distal 99% lesion. HIGH RISK PCI performed at [facility] with placement of DES x 3 to 2 distinct areas of SVG with filter protection.   OSA (obstructive sleep apnea)    a.) does NOT use nocturnal PAP therapy   Osteoarthritis    Pneumonia    Pre-diabetes    Primary neuroendocrine  carcinoma of rectum ([place]) 07/24/2020   a.) Bx (+) for well differentiated NET; WHO grade I.  b.) Stage I (cT1, cN0, cM0)   PVD (peripheral vascular disease) (HCC)    RA (rheumatoid arthritis) (HCC)    S/P CABG x 4 01/21/2002   a.) 4v; LIMA-LAD, SVG-D1, SVG-RCA, SVG-OM   SOB (shortness of breath)    ST elevation myocardial infarction (STEMI) of inferior wall ([place]) 07/30/2014   a.) LHC --> 50% LM, 90% mLCx, 100% OM2, 100% RPDA; LIMA-LAD patent; 100% occlusions of SVG OM2, OM3, PDA. No intervention; medical management.   Valvular regurgitation    a.) TTE 02/22/2020 --> LVEF 35-40%; mild panvalvular.    SURGICAL HISTORY: Past Surgical History:  Procedure Laterality Date   ABDOMINAL AORTA STENT     BIOPSY  11/05/2021   Procedure: BIOPSY;  Surgeon: Rush Landmark Telford Nab., MD;  Location: [facility];  Service: Gastroenterology;;   [facility]  COLONOSCOPY  11/24/2009   COLONOSCOPY N/A 10/09/2020   Procedure: COLONOSCOPY;  Surgeon: Mansouraty, Telford Nab., MD;  Location: [facility];  Service: Gastroenterology;  Laterality: N/A;   COLONOSCOPY WITH PROPOFOL N/A 07/24/2020   Procedure: COLONOSCOPY WITH PROPOFOL;  Surgeon: Toledo, Benay Pike, MD;  Location: [facility];  Service: Gastroenterology;  Laterality: N/A;   CORONARY ANGIOPLASTY WITH STENT PLACEMENT Left 05/03/2013   Procedure: CORONARY ANGIOPLASTY WITH STENT PLACEMENT (DES x 3 to SVG-OM graft); Location: [facility]   CORONARY ARTERY BYPASS GRAFT N/A 01/21/2002   Procedure: 4V CABG (LIMA-LAD, SVG-D1, SVG-RCA, SVG-OM); Location: [facility]   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   Procedure: DUPUYTREN CONTRACTURE RELEASE LEFT LONG FINGER;  Surgeon: Corky Mull, MD;  Location: [facility];  Service: Orthopedics;  Laterality: Left;   DUPUYTREN CONTRACTURE RELEASE Right 10/18/2020   Procedure: DUPUYTREN CONTRACTURE RELEASE;  Surgeon: Corky Mull, MD;  Location: [facility];  Service: Orthopedics;  Laterality: Right;   ENDOVASCULAR  REPAIR/STENT GRAFT N/A 09/24/2017   Procedure: ENDOVASCULAR REPAIR/STENT GRAFT (27 mm x 12 cm RIGHT iliac limb extension to distal CIA);  Surgeon: Algernon Huxley, MD;  Location: [facility];  Service: Cardiovascular;  Laterality: N/A;   ESOPHAGOGASTRODUODENOSCOPY  05/26/02  03/23/12   ESOPHAGOGASTRODUODENOSCOPY (EGD) WITH PROPOFOL N/A 10/28/2016   Procedure: ESOPHAGOGASTRODUODENOSCOPY (EGD) WITH PROPOFOL;  Surgeon: Manya Silvas, MD;  Location: [facility]COPY;  Service: Endoscopy;  Laterality: N/A;   ESOPHAGOGASTRODUODENOSCOPY (EGD) WITH PROPOFOL N/A 11/05/2021   Procedure: ESOPHAGOGASTRODUODENOSCOPY (EGD) WITH PROPOFOL;  Surgeon: Rush Landmark Telford Nab., MD;  Location: [facility];  Service: Gastroenterology;  Laterality: N/A;   EUS N/A 10/09/2020   Procedure: LOWER ENDOSCOPIC ULTRASOUND (EUS);  Surgeon: Irving Copas., MD;  Location: [facility];  Service: Gastroenterology;  Laterality: N/A;   EUS N/A 11/05/2021   Procedure: LOWER ENDOSCOPIC ULTRASOUND (EUS);  Surgeon: Irving Copas., MD;  Location: [facility];  Service: Gastroenterology;  Laterality: N/A;   FLEXIBLE SIGMOIDOSCOPY N/A 11/05/2021   Procedure: FLEXIBLE SIGMOIDOSCOPY;  Surgeon: Rush Landmark Telford Nab., MD;  Location: [facility];  Service: Gastroenterology;  Laterality: N/A;   HEMOSTASIS CLIP PLACEMENT  11/05/2021   Procedure: HEMOSTASIS CLIP PLACEMENT;  Surgeon: Irving Copas., MD;  Location: [facility];  Service: Gastroenterology;;   INCISION AND DRAINAGE ABSCESS N/A 06/14/2020   Procedure: INCISION AND DRAINAGE ABSCESS;  Surgeon: Herbert Pun, MD;  Location: [facility];  Service: General;  Laterality: N/A;   INGUINAL HERNIA REPAIR Left    LEFT HEART CATH AND CORONARY ANGIOGRAPHY N/A 01/19/2018   Procedure: LEFT HEART CATH AND CORONARY ANGIOGRAPHY;  Surgeon: Teodoro Spray, MD;  Location: [place] [facility];  Service: Cardiovascular;  Laterality: N/A;   LEFT HEART CATH AND  CORONARY ANGIOGRAPHY Left 11/14/1995   Procedure: CARDIAC CATHETERIZATION (PTCA); Location: [facility]; Surgeon: Doristine Bosworth, MD   LEFT HEART CATH AND CORS/GRAFTS ANGIOGRAPHY Left 07/30/2014   Procedure: CARDIAC CATHETERIZATION; Location: [facility]   PERIPHERAL VASCULAR CATHETERIZATION N/A 09/06/2014   Procedure: IVC Filter Removal;  Surgeon: Katha Cabal, MD;  Location: [facility];  Service: Cardiovascular;  Laterality: N/A;   POLYPECTOMY  10/09/2020   Procedure: POLYPECTOMY;  Surgeon: Rush Landmark Telford Nab., MD;  Location: [facility];  Service: Gastroenterology;;   POLYPECTOMY  11/05/2021   Procedure: POLYPECTOMY;  Surgeon: Irving Copas., MD;  Location: [facility];  Service: Gastroenterology;;   [place] INJECTION  10/09/2020   Procedure: SUBMUCOSAL LIFTING INJECTION;  Surgeon: Irving Copas., MD;  Location: [facility];  Service: Gastroenterology;;  SUBMUCOSAL TATTOO INJECTION  11/05/2021   Procedure: SUBMUCOSAL TATTOO INJECTION;  Surgeon: Irving Copas., MD;  Location: [facility];  Service: Gastroenterology;;   TRANSURETHRAL RESECTION OF PROSTATE N/A 02/20/2015   Procedure: TRANSURETHRAL RESECTION OF THE PROSTATE (TURP);  Surgeon: Hollice Espy, MD;  Location: [facility];  Service: Urology;  Laterality: N/A;    SOCIAL HISTORY: Social History   Socioeconomic History   Marital status: Legally Separated    Spouse name: Not on file   Number of children: Not on file   Years of education: Not on file   Highest education level: Not on file  Occupational History   Not on file  Tobacco Use   Smoking status: Some Days    Packs/day: 0.01    Years: 20.00    Total pack years: 0.20    Types: Cigarettes   Smokeless tobacco: Never   Tobacco comments:    sometimes 2-3 a day  Vaping Use   Vaping Use: Never used  Substance and Sexual Activity   Alcohol use: Yes    Alcohol/week: 1.0 - 2.0 standard drink of alcohol    Types: 1 - 2 Shots  of liquor per week    Comment: casual   Drug use: No   Sexual activity: Never  Other Topics Concern   Not on file  Social History Narrative   Not on file   Social Determinants of Health   Financial Resource Strain: Low Risk  (09/24/2017)   Overall Financial Resource Strain (CARDIA)    Difficulty of Paying Living Expenses: Not hard at all  Food Insecurity: No Food Insecurity (09/24/2017)   Hunger Vital Sign    Worried About Running Out of Food in the Last Year: Never true    Ran Out of Food in the Last Year: Never true  Transportation Needs: No Transportation Needs (09/24/2017)   PRAPARE - Hydrologist (Medical): No    Lack of Transportation (Non-Medical): No  Physical Activity: Sufficiently Active (09/24/2017)   Exercise Vital Sign    Days of Exercise per Week: 7 days    Minutes of Exercise per Session: 40 min  Stress: No Stress Concern Present (09/24/2017)   [place]    Feeling of Stress : Not at all  Social Connections: Somewhat Isolated (09/24/2017)   Social Connection and Isolation Panel [NHANES]    Frequency of Communication with Friends and Family: More than three times a week    Frequency of Social Gatherings with Friends and Family: More than three times a week    Attends Religious Services: More than 4 times per year    Active Member of [employer] or Organizations: No    Attends Archivist Meetings: Never    Marital Status: Separated  Intimate Partner Violence: Not At Risk (09/24/2017)   Humiliation, Afraid, Rape, and Kick questionnaire    Fear of Current or Ex-Partner: No    Emotionally Abused: No    Physically Abused: No    Sexually Abused: No    FAMILY HISTORY: Family History  Problem Relation Age of Onset   Cancer Mother 22       BREAST   Diabetes Mother    Coronary artery disease Father    Heart attack Father    Prostate cancer Brother    Bladder Cancer Neg  Hx    Kidney cancer Neg Hx     ALLERGIES:  is allergic to ace inhibitors, amoxicillin, niacin,  penicillins, pravastatin, rosuvastatin, and simvastatin.  MEDICATIONS:  Current Outpatient Medications  Medication Sig Dispense Refill   albuterol (VENTOLIN HFA) 108 (90 Base) MCG/ACT inhaler SMARTSIG:2 inhalation Via Inhaler Every 6 Hours PRN     amLODipine (NORVASC) 10 MG tablet Take 10 mg by mouth daily.      atorvastatin (LIPITOR) 80 MG tablet Take 80 mg by mouth daily.   11   Cholecalciferol (VITAMIN D3) 50 MCG (2000 UT) CAPS Take 2,000 Units/day by mouth daily.     fluticasone (FLONASE) 50 MCG/ACT nasal spray Place 2 sprays into both nostrils daily as needed for allergies.     gabapentin (NEURONTIN) 300 MG capsule Take 1 capsule (300 mg total) by mouth 3 (three) times daily. (Patient taking differently: Take 300 mg by mouth 2 (two) times daily.) 90 capsule 0   isosorbide mononitrate (IMDUR) 60 MG 24 hr tablet Take 1 tablet (60 mg total) by mouth daily. 30 tablet 0   latanoprost (XALATAN) 0.005 % ophthalmic solution Place 1 drop into both eyes at bedtime.      losartan (COZAAR) 25 MG tablet Take 25 mg by mouth daily.     methocarbamol (ROBAXIN) 500 MG tablet Take by mouth.     metoprolol succinate (TOPROL-XL) 100 MG 24 hr tablet Take 100 mg by mouth daily.      Multiple Vitamins-Minerals (MULTIVITAMIN MEN 50+ PO) Take 1 tablet by mouth daily.     pantoprazole (PROTONIX) 40 MG tablet Take 1 tablet (40 mg total) by mouth 2 (two) times daily before a meal. 60 tablet 6   potassium chloride SA (K-DUR,KLOR-CON) 20 MEQ tablet Take 20 mEq by mouth daily.      rivaroxaban (XARELTO) 20 MG TABS tablet Take 1 tablet (20 mg total) by mouth daily with supper. 30 tablet 3   TRELEGY ELLIPTA 100-62.5-25 MCG/ACT AEPB Inhale into the lungs.     No current facility-administered medications for this visit.     PHYSICAL EXAMINATION: ECOG 1 Vitals:   03/08/22 1019  BP: (!) 145/76  Pulse: (!) 58  Resp: 18   Temp: (!) 96.7 F (35.9 C)  SpO2: 97%  Physical Exam Vitals and nursing note reviewed.  Constitutional:      General: He is not in acute distress. HENT:     Head: Normocephalic and atraumatic.     Mouth/Throat:     Pharynx: No oropharyngeal exudate.  Eyes:     General: No scleral icterus. Neck:     Vascular: No JVD.  Cardiovascular:     Rate and Rhythm: Normal rate.     Heart sounds: Normal heart sounds. No murmur heard. Pulmonary:     Effort: Pulmonary effort is normal. No respiratory distress.     Breath sounds: No wheezing or rales.  Chest:     Chest wall: No tenderness.  Abdominal:     General: There is no distension.     Palpations: Abdomen is soft.     Tenderness: There is no abdominal tenderness.  Musculoskeletal:        General: No deformity. Normal range of motion.     Cervical back: Normal range of motion and neck supple.  Lymphadenopathy:     Cervical: No cervical adenopathy.  Skin:    General: Skin is warm and dry.     Findings: No erythema.  Neurological:     Mental Status: He is alert and oriented to person, place, and time. Mental status is at baseline.     Cranial Nerves: No  cranial nerve deficit.     Motor: No abnormal muscle tone.  Psychiatric:        Mood and Affect: Mood and affect normal.      LABORATORY DATA:  I have reviewed the data as listed    Latest Ref Rng & Units 03/05/2022    8:54 AM 08/31/2021   11:46 AM 08/30/2021   10:56 AM  CBC  WBC 4.0 - 10.5 K/uL 9.1  9.2  11.0   Hemoglobin 13.0 - 17.0 g/dL 15.6  14.7  14.9   Hematocrit 39.0 - 52.0 % 49.3  45.3  47.6   Platelets 150 - 400 K/uL 136  143  147    .cmpl  Hepatitis panel negative.  LDH 228, mildly elevated Beta 2 microglobulin normal.   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 The morphologic and immunophenotypic features are most consistent with chronic lymphocytic leukemia/small lymphocytic lymphoma. Bone marrow  Cytogenetics : normal.  Peripheral blood FISH panel negative for CCND1- IGH mutation, ATM, 12, 13q, Tp53 mutation.   RADIOGRAPHIC STUDIES: I have personally reviewed the radiological images as listed and agreed with the findings in the report. No results found.

## 2022-03-08 NOTE — Assessment & Plan Note (Addendum)
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  I clarified with radiology Dr. Jobe Igo.  An addendum was added that the T staging [ post resection] should be T0, no muscularis propia invasion.  10/2020 Flex sigmoidoscopy by Dr.Mansouraty did not reveal significant pathology at the rectosigmoid junction.  Flex sigmoidoscopy in Oct 2023-report not available to me. Check CT abdomen pelvis with contrast annually-September 2024.  Elevated chromogranin A is likely secondary to Protonix use.  He will stop Protonix for 2 weeks and repeat chromogranin A level.

## 2022-03-22 ENCOUNTER — Inpatient Hospital Stay: Payer: Medicare HMO

## 2022-03-22 DIAGNOSIS — C7A8 Other malignant neuroendocrine tumors: Secondary | ICD-10-CM

## 2022-03-22 DIAGNOSIS — C9110 Chronic lymphocytic leukemia of B-cell type not having achieved remission: Secondary | ICD-10-CM | POA: Diagnosis not present

## 2022-03-27 LAB — CHROMOGRANIN A: Chromogranin A (ng/mL): 51 ng/mL (ref 0.0–101.8)

## 2022-08-16 ENCOUNTER — Telehealth: Payer: Self-pay | Admitting: Gastroenterology

## 2022-08-16 NOTE — Telephone Encounter (Signed)
This is one of my patients.  His recall has not come up as of yet since it was due in October. But we can go ahead and get him scheduled for lower EUS. Patty, please schedule as able in October/November. Thanks. GM

## 2022-08-16 NOTE — Telephone Encounter (Signed)
Good Afternoon, Dr. Meridee Score,  We received a referral from Dr. Margarita Mail at [facility], for the patient to have a lower EUS. Records are available for you to review.   Thank you!

## 2022-08-17 ENCOUNTER — Emergency Department
Admission: EM | Admit: 2022-08-17 | Discharge: 2022-08-17 | Discharge status: Against Medical Advice | Payer: Medicare HMO | Attending: Emergency Medicine | Admitting: Emergency Medicine

## 2022-08-17 ENCOUNTER — Other Ambulatory Visit: Payer: Self-pay

## 2022-08-17 DIAGNOSIS — Z5321 Procedure and treatment not carried out due to patient leaving prior to being seen by health care provider: Secondary | ICD-10-CM | POA: Insufficient documentation

## 2022-08-17 DIAGNOSIS — L509 Urticaria, unspecified: Secondary | ICD-10-CM | POA: Insufficient documentation

## 2022-08-17 DIAGNOSIS — R21 Rash and other nonspecific skin eruption: Secondary | ICD-10-CM | POA: Diagnosis present

## 2022-08-17 NOTE — ED Triage Notes (Addendum)
Pt c/o itchy hives x2 days and was told to go to PCP if it got worse. Red, slightly raised rash noted on arms legs, and lower limbs. Pt is AOX4, NAD noted. Pt unsure of cause. Pt is on day 3 of prednisone and tried triamcinolone cream w/ out relief.

## 2022-08-19 ENCOUNTER — Other Ambulatory Visit: Payer: Self-pay

## 2022-08-19 DIAGNOSIS — D128 Benign neoplasm of rectum: Secondary | ICD-10-CM

## 2022-08-19 DIAGNOSIS — K317 Polyp of stomach and duodenum: Secondary | ICD-10-CM

## 2022-08-19 DIAGNOSIS — C7A8 Other malignant neuroendocrine tumors: Secondary | ICD-10-CM

## 2022-08-19 NOTE — Telephone Encounter (Signed)
Received the ok for xarelto 2 day hold from Dr Gwen Pounds.

## 2022-08-19 NOTE — Telephone Encounter (Signed)
Lower EUS has been set up for 11/11/22 at 1145 am at [facility] with GM   Left message on machine to call back

## 2022-08-20 NOTE — Telephone Encounter (Signed)
Left message on machine to call back  

## 2022-08-21 NOTE — Telephone Encounter (Signed)
EUS scheduled, pt instructed and medications reviewed.  Patient instructions mailed to home.  Patient to call with any questions or concerns.  The pt verbalized understanding of the xarelto hold instructions

## 2022-08-27 ENCOUNTER — Encounter: Payer: Self-pay | Admitting: Oncology

## 2022-08-27 ENCOUNTER — Other Ambulatory Visit: Payer: Self-pay | Admitting: Student

## 2022-08-27 DIAGNOSIS — H532 Diplopia: Secondary | ICD-10-CM

## 2022-09-03 ENCOUNTER — Ambulatory Visit
Admission: RE | Admit: 2022-09-03 | Discharge: 2022-09-03 | Disposition: A | Discharge status: Home / Self Care | Payer: Medicare HMO | Source: Ambulatory Visit | Attending: Student | Admitting: Student

## 2022-09-03 DIAGNOSIS — H532 Diplopia: Secondary | ICD-10-CM | POA: Diagnosis present

## 2022-09-03 MED ORDER — GADOBUTROL 1 MMOL/ML IV SOLN
10.0000 mL | Freq: Once | INTRAVENOUS | Status: AC | PRN
  Administered 2022-09-03: 10 mL via INTRAVENOUS

## 2022-09-10 ENCOUNTER — Ambulatory Visit
Admission: RE | Admit: 2022-09-10 | Discharge: 2022-09-10 | Disposition: A | Discharge status: Home / Self Care | Payer: Medicare HMO | Source: Ambulatory Visit | Attending: Oncology | Admitting: Oncology

## 2022-09-10 ENCOUNTER — Other Ambulatory Visit: Payer: Medicare HMO

## 2022-09-10 DIAGNOSIS — C7A8 Other malignant neuroendocrine tumors: Secondary | ICD-10-CM | POA: Diagnosis present

## 2022-09-10 MED ORDER — BARIUM SULFATE 2 % PO SUSP
900.0000 mL | Freq: Once | ORAL | Status: AC
  Administered 2022-09-10: 900 mL via ORAL

## 2022-09-10 MED ORDER — IOHEXOL 300 MG/ML  SOLN
100.0000 mL | Freq: Once | INTRAMUSCULAR | Status: AC | PRN
  Administered 2022-09-10: 100 mL via INTRAVENOUS

## 2022-09-19 ENCOUNTER — Other Ambulatory Visit (INDEPENDENT_AMBULATORY_CARE_PROVIDER_SITE_OTHER): Payer: Self-pay | Admitting: Nurse Practitioner

## 2022-09-19 DIAGNOSIS — I7143 Infrarenal abdominal aortic aneurysm, without rupture: Secondary | ICD-10-CM

## 2022-09-19 DIAGNOSIS — I739 Peripheral vascular disease, unspecified: Secondary | ICD-10-CM

## 2022-09-19 DIAGNOSIS — I6523 Occlusion and stenosis of bilateral carotid arteries: Secondary | ICD-10-CM

## 2022-09-20 ENCOUNTER — Encounter: Payer: Self-pay | Admitting: Oncology

## 2022-09-20 ENCOUNTER — Inpatient Hospital Stay: Payer: Medicare HMO | Attending: Oncology | Admitting: Oncology

## 2022-09-20 VITALS — BP 114/81 | HR 88 | Temp 98.4°F | Resp 16 | Ht 72.0 in | Wt 214.0 lb

## 2022-09-20 DIAGNOSIS — Z9049 Acquired absence of other specified parts of digestive tract: Secondary | ICD-10-CM | POA: Insufficient documentation

## 2022-09-20 DIAGNOSIS — I739 Peripheral vascular disease, unspecified: Secondary | ICD-10-CM | POA: Diagnosis not present

## 2022-09-20 DIAGNOSIS — N400 Enlarged prostate without lower urinary tract symptoms: Secondary | ICD-10-CM | POA: Diagnosis not present

## 2022-09-20 DIAGNOSIS — E785 Hyperlipidemia, unspecified: Secondary | ICD-10-CM | POA: Diagnosis not present

## 2022-09-20 DIAGNOSIS — Z7901 Long term (current) use of anticoagulants: Secondary | ICD-10-CM | POA: Diagnosis not present

## 2022-09-20 DIAGNOSIS — I2510 Atherosclerotic heart disease of native coronary artery without angina pectoris: Secondary | ICD-10-CM | POA: Insufficient documentation

## 2022-09-20 DIAGNOSIS — Z803 Family history of malignant neoplasm of breast: Secondary | ICD-10-CM | POA: Insufficient documentation

## 2022-09-20 DIAGNOSIS — K219 Gastro-esophageal reflux disease without esophagitis: Secondary | ICD-10-CM | POA: Insufficient documentation

## 2022-09-20 DIAGNOSIS — I5022 Chronic systolic (congestive) heart failure: Secondary | ICD-10-CM | POA: Insufficient documentation

## 2022-09-20 DIAGNOSIS — C7A026 Malignant carcinoid tumor of the rectum: Secondary | ICD-10-CM | POA: Diagnosis present

## 2022-09-20 DIAGNOSIS — Z7982 Long term (current) use of aspirin: Secondary | ICD-10-CM | POA: Insufficient documentation

## 2022-09-20 DIAGNOSIS — H0520 Unspecified exophthalmos: Secondary | ICD-10-CM | POA: Insufficient documentation

## 2022-09-20 DIAGNOSIS — Z79899 Other long term (current) drug therapy: Secondary | ICD-10-CM | POA: Insufficient documentation

## 2022-09-20 DIAGNOSIS — Z8673 Personal history of transient ischemic attack (TIA), and cerebral infarction without residual deficits: Secondary | ICD-10-CM | POA: Insufficient documentation

## 2022-09-20 DIAGNOSIS — C9110 Chronic lymphocytic leukemia of B-cell type not having achieved remission: Secondary | ICD-10-CM | POA: Insufficient documentation

## 2022-09-20 DIAGNOSIS — I252 Old myocardial infarction: Secondary | ICD-10-CM | POA: Insufficient documentation

## 2022-09-20 DIAGNOSIS — I429 Cardiomyopathy, unspecified: Secondary | ICD-10-CM | POA: Insufficient documentation

## 2022-09-20 DIAGNOSIS — F17210 Nicotine dependence, cigarettes, uncomplicated: Secondary | ICD-10-CM | POA: Diagnosis not present

## 2022-09-20 DIAGNOSIS — C7A8 Other malignant neuroendocrine tumors: Secondary | ICD-10-CM

## 2022-09-20 DIAGNOSIS — Z86718 Personal history of other venous thrombosis and embolism: Secondary | ICD-10-CM | POA: Insufficient documentation

## 2022-09-20 DIAGNOSIS — Z8042 Family history of malignant neoplasm of prostate: Secondary | ICD-10-CM | POA: Insufficient documentation

## 2022-09-20 DIAGNOSIS — I4811 Longstanding persistent atrial fibrillation: Secondary | ICD-10-CM

## 2022-09-20 DIAGNOSIS — G4733 Obstructive sleep apnea (adult) (pediatric): Secondary | ICD-10-CM | POA: Diagnosis not present

## 2022-09-20 DIAGNOSIS — R161 Splenomegaly, not elsewhere classified: Secondary | ICD-10-CM | POA: Diagnosis not present

## 2022-09-20 DIAGNOSIS — I4891 Unspecified atrial fibrillation: Secondary | ICD-10-CM | POA: Insufficient documentation

## 2022-09-20 DIAGNOSIS — I110 Hypertensive heart disease with heart failure: Secondary | ICD-10-CM | POA: Insufficient documentation

## 2022-09-20 DIAGNOSIS — M069 Rheumatoid arthritis, unspecified: Secondary | ICD-10-CM | POA: Insufficient documentation

## 2022-09-20 DIAGNOSIS — I6782 Cerebral ischemia: Secondary | ICD-10-CM | POA: Diagnosis not present

## 2022-09-20 DIAGNOSIS — I700 Atherosclerosis of aorta: Secondary | ICD-10-CM | POA: Diagnosis not present

## 2022-09-20 DIAGNOSIS — K5730 Diverticulosis of large intestine without perforation or abscess without bleeding: Secondary | ICD-10-CM | POA: Insufficient documentation

## 2022-09-20 DIAGNOSIS — Z8719 Personal history of other diseases of the digestive system: Secondary | ICD-10-CM | POA: Insufficient documentation

## 2022-09-20 NOTE — Assessment & Plan Note (Signed)
#  CLL, stable.  Patient has been in remission.  Off treatment since November 2019. Lymphocytosis 4.7, Early recurrence. He is asymptomatic without constitutional symptoms.. Continue watchful observation.

## 2022-09-20 NOTE — Assessment & Plan Note (Addendum)
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  I clarified with radiology Dr. Reche Dixon.  An addendum was added that the T staging [ post resection] should be T0, no muscularis propia invasion.  10/2020 Flex sigmoidoscopy by Dr.Mansouraty did not reveal significant pathology at the rectosigmoid junction.  Flex sigmoidoscopy in Oct 2023-report not available to me. Check CT abdomen pelvis with contrast annually-September 2024 CT stable. Continue follow up with GI   He will stop Protonix for 2 weeks and repeat chromogranin A level.

## 2022-09-20 NOTE — Progress Notes (Signed)
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  I clarified with radiology Dr. Reche Dixon.  An addendum was added that the T staging [ post resection] should be T0, no muscularis propia invasion.  10/2020 Flex sigmoidoscopy by Dr.Mansouraty did not reveal significant pathology at the rectosigmoid junction.  Flex sigmoidoscopy in Oct 2023-report not available to me. Check CT abdomen pelvis with contrast annually-September 2024 CT stable. Continue follow up with GI   He will stop Protonix for 2 weeks and repeat chromogranin A level.  Atrial fibrillation (HCC) on chronic anticoagulation with Xarelto. Follow-up with cardiology.   CLL (chronic lymphocytic leukemia) (HCC) #CLL, stable.  Patient has been in remission.  Off treatment since November 2019. Lymphocytosis 4.7, Early recurrence. He is asymptomatic without constitutional symptoms.. Continue watchful observation.   Orders Placed This Encounter  Procedures   CBC with Differential (Cancer Center Only)    Standing Status:   Future    Standing Expiration Date:   09/20/2023   CMP (Cancer Center only)    Standing Status:   Future    Standing Expiration Date:   09/20/2023   Chromogranin A    Standing Status:   Future    Standing Expiration Date:   09/20/2023   Flow cytometry panel-leukemia/lymphoma work-up    Standing Status:   Future    Standing Expiration Date:   09/20/2023   Lactate dehydrogenase    Standing Status:   Future    Standing Expiration Date:   09/20/2023   Follow-up in 6 months. All questions were answered. The patient knows to call the clinic with any problems, questions or concerns.  Rickard Patience, MD, PhD [facility] Hematology  Oncology 09/20/2022    HISTORY OF PRESENTING ILLNESS:  Martin Adkins is a  77 y.o.  male with PMH listed below who was referred to me for evaluation of lymphocytosis.  Recent lab work on 03/05/2017 showed wbc 11.4, hb 14.6, platelet count 117,000, lymphocytosis 63.5%, neutrophil 22%, Report chronic fatigue, no weight loss, fever or chills.  He reports feeling chest "rattleness". Has for CT chest which showed acute finding, chronic finding includes  postsurgical changes consistent with coronary bypass grafting. One of the bypass grafts arising from the aorta shows apparent thrombosis and an area of distal aneurysmal dilatation which is also Thrombosed. Right adrenal adenoma stable in appearance. Mild scarring in the lung bases.Fatty liver.   Takes Aspirin 81mg , coumadine, plavix. He is on anticoagulation for previous history of clots.  Denies any bleeding events. Use to drink hard liquir daily, quitted for 4-5 years. Current drinks wine on weekend.    Image Studies # 04/04/2017  US abdomen showed 1.  Splenomegaly.  No focal splenic lesions evident. 2. Increased liver echogenicity, a finding most likely indicative of hepatic steatosis. While no focal liver lesions are evident on this study, it must be cautioned that the sensitivity of ultrasound for detection of focal liver lesions is diminished in this circumstance.3. Pancreas obscured by gas. Portions of inferior vena cava obscured by gas.4.  Gallbladder absent.5. Status post abdominal aortic aneurysm repair. No periaortic fluid.  # 04/21/2017 PET scan 1. Moderate splenomegaly with uniform splenic hypermetabolism,compatible with the provided history of lymphoproliferative disease.No additional hypermetabolic sites of lymphoproliferative disease. Specifically, no  hypermetabolic lymphadenopathy Hepatic steatosis, aortic atherosclerosis. Aneurysm.    # CLL, stage IV disease given spelencemagly, thrombocytopenia, symptomatic with fatigue and  weight loss.  CLL IPL score 4, high risk.   # status post abdominal aortic aneurysm repair on 09/25/2017 s/p cardiac cath which revealed patent LIMA to the LAD, no significant disease in the RCA with a 90% stenosis in a small RV marginal branch.  Occluded left circumflex, occluded LAD.  Saphenous vein grafts are all occluded.  RCA does not require grafting.  LAD is well grafted by the left internal mammary.  Medical management  07/24/2020, colonoscopy showed diverticulosis in the sigmoid colon.  Multiple polyps were removed from descending colon, proximal ascending colon, transverse colon, sigmoid colon and 5 small 2-58mm polyps resected from rectum. Polyps from the colon are tubular adenoma, negative for high-grade dysplasia and malignancy. Polyps from rectum showed well differentiated neuroendocrine tumor, grade 1, involving the base of the submitted tissue-1 fragment, hyperplastic polyp 2 fragments.  10/09/2020 EUS by Dr.Mansouraty  FLEX Impression: Stool in the sigmoid colon and in the descending colon. - Preparation of the colon was fair even after significant lavage - however large stool began to come through the rest of the colon as procedure continued. - >30 1 to 5 mm polyps in the rectum, at the recto-sigmoid colon and in the sigmoid colon. 16 of the polyps which were in the rectum were removed with a cold snare. Resected and retrieved.- Non-bleeding non-thrombosed external and internal hemorrhoids  EUS Impression: - Endosonographic imaging showed no sign of significant pathology at the rectosigmoid junction. - Endosonographic images of the rectum were unremarkable. - Endosonographic images of the perirectal space were unremarkable. - No malignant-appearing lymph nodes were visualized endosonographically in the perirectal region and in the left iliac region. - The internal anal sphincter was visualized endosonographically and appeared normal  Dr.Mansouraty recommend repeat 1 year Flex  sigmoidoscopy with EUS  INTERVAL HISTORY Martin Adkins is a 78 y.o. male who has above presents for follow-up of CLL, rectal neuroendocrine tumor. He has been off venetoclax since 11/11/2017. Patient denies unintentional weight loss, night sweats, fever.  Appetite is good. No new complaints. He takes protonix long term.   Review of Systems  Constitutional:  Negative for appetite change, chills, fatigue, fever and unexpected weight change.  HENT:   Negative for hearing loss and voice change.   Eyes:  Negative for eye problems and icterus.  Respiratory:  Positive for shortness of breath. Negative for chest tightness, cough and wheezing.   Cardiovascular:  Negative for chest pain and leg swelling.  Gastrointestinal:  Negative for abdominal distention and abdominal pain.  Endocrine: Negative for hot flashes.  Genitourinary:  Negative for difficulty urinating, dysuria and frequency.   Musculoskeletal:  Negative for arthralgias and back pain.  Skin:  Negative for itching and rash.  Neurological:  Positive for numbness. Negative for dizziness, headaches and light-headedness.  Hematological:  Negative for adenopathy. Does not bruise/bleed easily.  Psychiatric/Behavioral:  Negative for confusion.      MEDICAL HISTORY:  Past Medical History:  Diagnosis Date   AAA (abdominal aortic aneurysm) without rupture (HCC)    a.) [place] 02/09/04 - 3.1 x 3.4 cm. b.) [place] 02/14/05 - 3.62 x 3.63 cm. c.) [place] 02/25/06 - 3.97 x 4.0 cm. d.) CT 07/09/11 - 4.9 cm. e.) s/p EVAR 2013. f.) enlarging AAA --> back to the OR on 09/24/2017 for placement of a 27 mm x 12 cm RIGHT iliac extension limb down  to distal CIA.   Aortic atherosclerosis (HCC)    APS (antiphospholipid syndrome) (HCC)    Arthritis    Atrial fibrillation (HCC)    a.) CHA2DS2-VASc Score = 6 (age, CHF, HTN, prior DVT x 2, prior MI). b.) on rivaroxaban   B12 deficiency 03/21/2017   Barrett's esophagus    BPH (benign prostatic hyperplasia)    CAD  (coronary artery disease)    a.) LHC 11/14/95 --> 95% mLCx, 95% OM1; PTCA. b.) 4v CABG 2004 --> LIMA-LAD, SVG-D1,RCA,OM. c.) NSTEMI 05/02/2013 --> PCI with DES x 3 to SVG-OM in 2 distinct areas with filter. d.) Inf STEMI 07/30/14 - LHC --> 50% LM, 90% mLCx, 100% OM2, 100% RPDA; LIMA-LAD patent; 100% occ of SVG OM2,OM3,PDA; med mgmt. e.) LHC 01/19/18 - patent LIMA-LAD; other grafts occ; not amenable to PCI/further bypass.   Cardiomyopathy (HCC)    Chronic anticoagulation    a.) Rivaroxaban   CLL (chronic lymphocytic leukemia) (HCC) 05/24/2017   DVT (deep venous thrombosis) (HCC)    GERD (gastroesophageal reflux disease)    HFrEF (heart failure with reduced ejection fraction) (HCC)    a.) TTE 02/22/2020 --> mod. LV systolic dysfunction; LVEF 35-40%.   History of hiatal hernia    History of shingles 2013   Hyperlipemia    Hyperplastic polyps of stomach    a.) Bx on 07/24/2020 --> well differentiated NET; WHO grade I.   Hypertension    NSTEMI (non-ST elevated myocardial infarction) (HCC) 05/02/2013   a.) LHC 05/03/2013 --> 50% LM, 100% LAD, 75% mLCx, 100% dLCx, 60% mRCA; patient LIMA-LAD, occluded SVG-D1 and SVG-PDA. SVG-OM with extensive thrombus and distal 99% lesion. HIGH RISK PCI performed at [facility] with placement of DES x 3 to 2 distinct areas of SVG with filter protection.   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 No intervention; medical management.   Valvular regurgitation    a.) TTE 02/22/2020 --> LVEF 35-40%; mild panvalvular.    SURGICAL HISTORY: Past Surgical History:  Procedure Laterality Date   ABDOMINAL AORTA STENT     BIOPSY  11/05/2021   Procedure: BIOPSY;  Surgeon: Meridee Score Netty Starring., MD;  Location: [facility];  Service: Gastroenterology;;   CHOLECYSTECTOMY     COLONOSCOPY  11/24/2009   COLONOSCOPY N/A 10/09/2020   Procedure: COLONOSCOPY;  Surgeon: Lemar Lofty., MD;  Location: [facility];  Service: Gastroenterology;  Laterality: N/A;   COLONOSCOPY WITH PROPOFOL N/A 07/24/2020   Procedure: COLONOSCOPY WITH PROPOFOL;  Surgeon: Toledo, Boykin Nearing, MD;  Location: [facility];  Service: Gastroenterology;  Laterality: N/A;   CORONARY ANGIOPLASTY WITH STENT PLACEMENT Left 05/03/2013   Procedure: CORONARY ANGIOPLASTY WITH STENT PLACEMENT (DES x 3 to SVG-OM graft); Location: [facility]   CORONARY ARTERY BYPASS GRAFT N/A 01/21/2002   Procedure: 4V CABG (LIMA-LAD, SVG-D1, SVG-RCA, SVG-OM); Location: [facility]  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   Procedure: DUPUYTREN CONTRACTURE RELEASE LEFT LONG FINGER;  Surgeon: Christena Flake, MD;  Location: [facility];  Service: Orthopedics;  Laterality: Left;   DUPUYTREN CONTRACTURE RELEASE Right 10/18/2020   Procedure: DUPUYTREN CONTRACTURE RELEASE;  Surgeon: Christena Flake, MD;  Location: [facility];  Service: Orthopedics;  Laterality: Right;   ENDOVASCULAR REPAIR/STENT GRAFT N/A 09/24/2017   Procedure: ENDOVASCULAR REPAIR/STENT GRAFT (27 mm x 12 cm RIGHT iliac limb extension to distal CIA);  Surgeon: Annice Needy, MD;  Location: [facility];  Service: Cardiovascular;  Laterality: N/A;   ESOPHAGOGASTRODUODENOSCOPY  05/26/02  03/23/12   ESOPHAGOGASTRODUODENOSCOPY (EGD) WITH PROPOFOL N/A 10/28/2016   Procedure: ESOPHAGOGASTRODUODENOSCOPY (EGD) WITH PROPOFOL;  Surgeon: Scot Jun, MD;  Location: [facility];   Service: Endoscopy;  Laterality: N/A;   ESOPHAGOGASTRODUODENOSCOPY (EGD) WITH PROPOFOL N/A 11/05/2021   Procedure: ESOPHAGOGASTRODUODENOSCOPY (EGD) WITH PROPOFOL;  Surgeon: Meridee Score Netty Starring., MD;  Location: [facility];  Service: Gastroenterology;  Laterality: N/A;   EUS N/A 10/09/2020   Procedure: LOWER ENDOSCOPIC ULTRASOUND (EUS);  Surgeon: Lemar Lofty., MD;  Location: [facility];  Service: Gastroenterology;  Laterality: N/A;   EUS N/A 11/05/2021   Procedure: LOWER ENDOSCOPIC ULTRASOUND (EUS);  Surgeon: Lemar Lofty., MD;  Location: [facility];  Service: Gastroenterology;  Laterality: N/A;   FLEXIBLE SIGMOIDOSCOPY N/A 11/05/2021   Procedure: FLEXIBLE SIGMOIDOSCOPY;  Surgeon: Meridee Score Netty Starring., MD;  Location: [facility];  Service: Gastroenterology;  Laterality: N/A;   HEMOSTASIS CLIP PLACEMENT  11/05/2021   Procedure: HEMOSTASIS CLIP PLACEMENT;  Surgeon: Lemar Lofty., MD;  Location: [facility];  Service: Gastroenterology;;   INCISION AND DRAINAGE ABSCESS N/A 06/14/2020   Procedure: INCISION AND DRAINAGE ABSCESS;  Surgeon: Carolan Shiver, MD;  Location: [facility];  Service: General;  Laterality: N/A;   INGUINAL HERNIA REPAIR Left    LEFT HEART CATH AND CORONARY ANGIOGRAPHY N/A 01/19/2018   Procedure: LEFT HEART CATH AND CORONARY ANGIOGRAPHY;  Surgeon: Dalia Heading, MD;  Location: [facility];  Service: Cardiovascular;  Laterality: N/A;   LEFT HEART CATH AND CORONARY ANGIOGRAPHY Left 11/14/1995   Procedure: CARDIAC CATHETERIZATION (PTCA); Location: [facility]; Surgeon: Eugenia Pancoast, MD   LEFT HEART CATH AND CORS/GRAFTS ANGIOGRAPHY Left 07/30/2014   Procedure: CARDIAC CATHETERIZATION; Location: [facility]   PERIPHERAL VASCULAR CATHETERIZATION N/A 09/06/2014   Procedure: IVC Filter Removal;  Surgeon: Renford Dills, MD;  Location: [facility];  Service: Cardiovascular;  Laterality: N/A;   POLYPECTOMY  10/09/2020   Procedure:  POLYPECTOMY;  Surgeon: Meridee Score Netty Starring., MD;  Location: [facility];  Service: Gastroenterology;;   POLYPECTOMY  11/05/2021   Procedure: POLYPECTOMY;  Surgeon: Lemar Lofty., MD;  Location: [facility];  Service: Gastroenterology;;   SUBMUCOSAL LIFTING INJECTION  10/09/2020   Procedure: SUBMUCOSAL LIFTING INJECTION;  Surgeon: Lemar Lofty., MD;  Location: [facility];  Service: Gastroenterology;;   SUBMUCOSAL TATTOO INJECTION  11/05/2021   Procedure: SUBMUCOSAL TATTOO INJECTION;  Surgeon: Lemar Lofty., MD;  Location: [facility];  Service: Gastroenterology;;   TRANSURETHRAL RESECTION OF PROSTATE N/A 02/20/2015   Procedure: TRANSURETHRAL RESECTION OF THE PROSTATE (TURP);  Surgeon: Vanna Scotland, MD;  Location: [facility];  Service: Urology;  Laterality: N/A;    SOCIAL HISTORY: Social History   Socioeconomic History   Marital status: Legally Separated    Spouse name: Not on file   Number of children: Not on file   Years of education: Not on file   Highest education level:  Not on file  Occupational History   Not on file  Tobacco Use   Smoking status: Some Days    Current packs/day: 0.01    Average packs/day: (0.2 ttl pk-yrs)    Types: Cigarettes   Smokeless tobacco: Never   Tobacco comments:    sometimes 2-3 a day  Vaping Use   Vaping status: Never Used  Substance and Sexual Activity   Alcohol use: Yes    Alcohol/week: 1.0 - 2.0 standard drink of alcohol    Types: 1 - 2 Shots of liquor per week    Comment: casual   Drug use: No   Sexual activity: Never  Other Topics Concern   Not on file  Social History Narrative   Not on file   Social Determinants of Health   Financial Resource Strain: Low Risk  (09/24/2017)   Overall Financial Resource Strain (CARDIA)    Difficulty of Paying Living Expenses: Not hard at all  Food Insecurity: No Food Insecurity (09/24/2017)   Hunger Vital Sign    Worried About Running Out of Food in the Last  Year: Never true    Ran Out of Food in the Last Year: Never true  Transportation Needs: No Transportation Needs (09/24/2017)   PRAPARE - Administrator, Civil Service (Medical): No    Lack of Transportation (Non-Medical): No  Physical Activity: Sufficiently Active (09/24/2017)   Exercise Vital Sign    Days of Exercise per Week: 7 days    Minutes of Exercise per Session: 40 min  Stress: No Stress Concern Present (09/24/2017)   [employer] of Occupational Health - Occupational Stress Questionnaire    Feeling of Stress : Not at all  Social Connections: Somewhat Isolated (09/24/2017)   Social Connection and Isolation Panel [NHANES]    Frequency of Communication with Friends and Family: More than three times a week    Frequency of Social Gatherings with Friends and Family: More than three times a week    Attends Religious Services: More than 4 times per year    Active Member of [employer] or Organizations: No    Attends Banker Meetings: Never    Marital Status: Separated  Intimate Partner Violence: Not At Risk (09/24/2017)   Humiliation, Afraid, Rape, and Kick questionnaire    Fear of Current or Ex-Partner: No    Emotionally Abused: No    Physically Abused: No    Sexually Abused: No    FAMILY HISTORY: Family History  Problem Relation Age of Onset   Cancer Mother 54       BREAST   Diabetes Mother    Coronary artery disease Father    Heart attack Father    Prostate cancer Brother    Bladder Cancer Neg Hx    Kidney cancer Neg Hx     ALLERGIES:  is allergic to ace inhibitors, amoxicillin, niacin, penicillins, pravastatin, rosuvastatin, and simvastatin.  MEDICATIONS:  Current Outpatient Medications  Medication Sig Dispense Refill   albuterol (VENTOLIN HFA) 108 (90 Base) MCG/ACT inhaler SMARTSIG:2 inhalation Via Inhaler Every 6 Hours PRN     amLODipine (NORVASC) 10 MG tablet Take 10 mg by mouth daily.      atorvastatin (LIPITOR) 80 MG tablet Take 80 mg by  mouth daily.   11   Cholecalciferol (VITAMIN D3) 50 MCG (2000 UT) CAPS Take 2,000 Units/day by mouth daily.     fluticasone (FLONASE) 50 MCG/ACT nasal spray Place 2 sprays into both nostrils daily as needed for allergies.  gabapentin (NEURONTIN) 300 MG capsule Take 1 capsule (300 mg total) by mouth 3 (three) times daily. (Patient taking differently: Take 300 mg by mouth 2 (two) times daily.) 90 capsule 0   isosorbide mononitrate (IMDUR) 60 MG 24 hr tablet Take 1 tablet (60 mg total) by mouth daily. 30 tablet 0   latanoprost (XALATAN) 0.005 % ophthalmic solution Place 1 drop into both eyes at bedtime.      losartan (COZAAR) 25 MG tablet Take 25 mg by mouth daily.     methocarbamol (ROBAXIN) 500 MG tablet Take by mouth.     metoprolol succinate (TOPROL-XL) 100 MG 24 hr tablet Take 100 mg by mouth daily.      Multiple Vitamins-Minerals (MULTIVITAMIN MEN 50+ PO) Take 1 tablet by mouth daily.     pantoprazole (PROTONIX) 40 MG tablet Take 1 tablet (40 mg total) by mouth 2 (two) times daily before a meal. 60 tablet 6   potassium chloride SA (K-DUR,KLOR-CON) 20 MEQ tablet Take 20 mEq by mouth daily.      rivaroxaban (XARELTO) 20 MG TABS tablet Take 1 tablet (20 mg total) by mouth daily with supper. 30 tablet 3   TRELEGY ELLIPTA 100-62.5-25 MCG/ACT AEPB Inhale into the lungs.     No current facility-administered medications for this visit.     PHYSICAL EXAMINATION: ECOG 1 Vitals:   09/20/22 1029  BP: 114/81  Pulse: 88  Resp: 16  Temp: 98.4 F (36.9 C)  SpO2: 100%  Physical Exam Vitals and nursing note reviewed.  Constitutional:      General: He is not in acute distress. HENT:     Head: Normocephalic and atraumatic.     Mouth/Throat:     Pharynx: No oropharyngeal exudate.  Eyes:     General: No scleral icterus. Neck:     Vascular: No JVD.  Cardiovascular:     Rate and Rhythm: Normal rate.     Heart sounds: Normal heart sounds. No murmur heard. Pulmonary:     Effort: Pulmonary  effort is normal. No respiratory distress.     Breath sounds: No wheezing or rales.  Chest:     Chest wall: No tenderness.  Abdominal:     General: There is no distension.     Palpations: Abdomen is soft.     Tenderness: There is no abdominal tenderness.  Musculoskeletal:        General: No deformity. Normal range of motion.     Cervical back: Normal range of motion and neck supple.  Lymphadenopathy:     Cervical: No cervical adenopathy.  Skin:    General: Skin is warm and dry.     Findings: No erythema.  Neurological:     Mental Status: He is alert and oriented to person, place, and time. Mental status is at baseline.     Cranial Nerves: No cranial nerve deficit.     Motor: No abnormal muscle tone.  Psychiatric:        Mood and Affect: Mood and affect normal.      LABORATORY DATA:  I have reviewed the data as listed    Latest Ref Rng & Units 03/05/2022    8:54 AM 08/31/2021   11:46 AM 08/30/2021   10:56 AM  CBC  WBC 4.0 - 10.5 K/uL 9.1  9.2  11.0   Hemoglobin 13.0 - 17.0 g/dL 60.7  37.1  06.2   Hematocrit 39.0 - 52.0 % 49.3  45.3  47.6   Platelets 150 - 400 K/uL 136  143  147    .cmpl  Hepatitis panel negative.  LDH 228, mildly elevated Beta 2 microglobulin normal.   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 The morphologic and immunophenotypic features are most consistent with chronic lymphocytic leukemia/small lymphocytic lymphoma. Bone marrow Cytogenetics : normal.  Peripheral blood FISH panel negative for CCND1- IGH mutation, ATM, 12, 13q, Tp53 mutation.   RADIOGRAPHIC STUDIES: I have personally reviewed the radiological images as listed and agreed with the findings in the report. CT Abdomen Pelvis W Contrast  Result Date: 09/18/2022 CLINICAL DATA:  Neuroendocrine tumor of the rectum. Restaging. * Tracking Code: BO * EXAM: CT ABDOMEN AND PELVIS WITH CONTRAST TECHNIQUE: Multidetector CT imaging of the abdomen  and pelvis was performed using the standard protocol following bolus administration of intravenous contrast. RADIATION DOSE REDUCTION: This exam was performed according to the departmental dose-optimization program which includes automated exposure control, adjustment of the mA and/or kV according to patient size and/or use of iterative reconstruction technique. CONTRAST:  OMNIPAQUE IOHEXOL 300 MG/ML  SOLN COMPARISON:  09/11/2021 FINDINGS: Lower chest: Unremarkable. Hepatobiliary: No suspicious focal abnormality within the liver parenchyma. Cholecystectomy. No intrahepatic or extrahepatic biliary dilation. Pancreas: No focal mass lesion. No dilatation of the main duct. No intraparenchymal cyst. No peripancreatic edema. Spleen: No splenomegaly. No suspicious focal mass lesion. Adrenals/Urinary Tract: Stable 1.8 cm right adrenal nodule, also unchanged comparing back to CT stone study 07/09/2011. 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 No followup imaging is recommended. Left adrenal gland unremarkable. Right kidney unremarkable. Stable 4.7 cm simple cyst interpolar left kidney. No followup imaging is recommended. Cortical scarring noted inferior left kidney. No evidence for hydroureter. The urinary bladder appears normal for the degree of distention. Stomach/Bowel: Stomach is unremarkable. No gastric wall thickening. No evidence of outlet obstruction. Duodenum is normally positioned as is the ligament of Treitz. No small bowel wall thickening. No small bowel dilatation. The terminal ileum is normal. The appendix is normal. No gross colonic mass. No colonic wall thickening. Mild circumferential wall thickening in the low rectum is nonspecific. Vascular/Lymphatic: Status post aorto bi iliac endograft placement. There is no gastrohepatic or hepatoduodenal ligament lymphadenopathy. No retroperitoneal or mesenteric lymphadenopathy. No pelvic sidewall lymphadenopathy. Reproductive:  The prostate gland and seminal vesicles are unremarkable. Other: No intraperitoneal free fluid. Musculoskeletal: Small to moderate right groin hernia contains only fat. Tiny left groin hernia contains fat and suggests recurrent hernia given evidence of mesh in this region. No worrisome lytic or sclerotic osseous abnormality. IMPRESSION: 1. No evidence for metastatic disease in the abdomen or pelvis. 2. Mild circumferential wall thickening in the low rectum is nonspecific. 3. Stable 1.8 cm right adrenal nodule, also unchanged comparing back to CT stone study 07/09/2011. 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 No followup imaging is recommended. 4. Small to moderate right groin hernia contains only fat. Tiny left groin hernia contains fat and suggests recurrent hernia given evidence of mesh in this region. Electronically Signed   By: Kennith [facility] M.D[facility]   On: 09/18/2022 11:47   MR BRAIN W WO CONTRAST  Result Date: 09/12/2022 CLINICAL DATA:  Provided history: Vertical diplopia. Additional history provided by the scanning technologist: The patient reports migraine headaches with aura. EXAM: MRI HEAD WITHOUT AND WITH CONTRAST TECHNIQUE: Multiplanar, multiecho pulse sequences of the brain and surrounding structures were obtained without and with intravenous contrast. CONTRAST:  10mL GADAVIST GADOBUTROL 1 MMOL/ML  IV SOLN COMPARISON:  Brain 10/29/2021. FINDINGS: Brain: Mild generalized cerebral atrophy. Redemonstrated small chronic cortical infarct within the left parietal lobe (series 15, image 34). Multifocal T2 FLAIR hyperintense signal abnormality within the cerebral white matter, nonspecific but compatible with mild chronic small vessel ischemic disease. Redemonstrated small chronic infarct within the left cerebellar hemisphere. There is no acute infarct. No evidence of an intracranial mass. No chronic intracranial blood products. No extra-axial fluid collection. No midline shift.  No pathologic intracranial enhancement identified. Vascular: Maintained flow voids within the proximal large arterial vessels. Skull and upper cervical spine: No focal suspicious marrow lesion. Sinuses/Orbits: Bilateral proptosis. Mild mucosal thickening within the bilateral frontal, left ethmoid and left maxillary sinuses. IMPRESSION: 1. No evidence of an acute intracranial abnormality. 2. Mild chronic small vessel ischemic changes within the cerebral white matter, similar to the prior brain MRI of 10/29/2021. 3. 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 Redemonstrated small chronic infarct within the left cerebellar hemisphere. 5. Mild generalized cerebral atrophy. 6. Bilateral proptosis. 7. Mild paranasal sinus mucosal thickening. Electronically Signed   By: Jackey Loge D.O.   On: 09/12/2022 08:40

## 2022-09-20 NOTE — Assessment & Plan Note (Signed)
on chronic anticoagulation with Xarelto. Follow-up with cardiology.

## 2022-09-26 ENCOUNTER — Encounter: Payer: Self-pay | Admitting: Oncology

## 2022-09-27 ENCOUNTER — Ambulatory Visit (INDEPENDENT_AMBULATORY_CARE_PROVIDER_SITE_OTHER): Payer: Medicare HMO

## 2022-09-27 ENCOUNTER — Ambulatory Visit (INDEPENDENT_AMBULATORY_CARE_PROVIDER_SITE_OTHER): Payer: Medicare HMO | Admitting: Vascular Surgery

## 2022-09-27 VITALS — BP 136/82 | HR 84 | Resp 19 | Ht 72.0 in | Wt 216.0 lb

## 2022-09-27 DIAGNOSIS — I6523 Occlusion and stenosis of bilateral carotid arteries: Secondary | ICD-10-CM | POA: Diagnosis not present

## 2022-09-27 DIAGNOSIS — I6529 Occlusion and stenosis of unspecified carotid artery: Secondary | ICD-10-CM

## 2022-09-27 DIAGNOSIS — E1151 Type 2 diabetes mellitus with diabetic peripheral angiopathy without gangrene: Secondary | ICD-10-CM

## 2022-09-27 DIAGNOSIS — I10 Essential (primary) hypertension: Secondary | ICD-10-CM

## 2022-09-27 DIAGNOSIS — I739 Peripheral vascular disease, unspecified: Secondary | ICD-10-CM

## 2022-09-27 DIAGNOSIS — I7143 Infrarenal abdominal aortic aneurysm, without rupture: Secondary | ICD-10-CM | POA: Diagnosis not present

## 2022-09-27 NOTE — Assessment & Plan Note (Signed)
ABIs remain noncompressible but digit pressures are normal and waveforms remain multiphasic.  Continue current medical regimen.  No role for intervention.  Recheck in 1 year.

## 2022-09-27 NOTE — Assessment & Plan Note (Signed)
blood glucose control important in reducing the progression of atherosclerotic disease. Also, involved in wound healing. On appropriate medications.

## 2022-09-27 NOTE — Progress Notes (Signed)
MRN : [id number]  Martin Adkins is a 77 y.o. (03/26/1945) male who presents with chief complaint of  Chief Complaint  Patient presents with   Venous Insufficiency  .  History of Present Illness: Patient returns today in follow up of multiple vascular issues. He is doing well today.  He has no major issues since his last visit.  He is many years s/p endovascular AAA repair.  No aneurysm related symptoms. Specifically, the patient denies new back or abdominal pain, or signs of peripheral embolization.  Duplex today shows a patent stent graft without endoleak.  The aortic sac diameter is stable at 4.1 cm. We also follow him for peripheral arterial disease.  No lifestyle limiting claudication, ischemic rest pain, or ulceration. ABIs remain noncompressible but digit pressures are normal and waveforms remain multiphasic.  We also checked his carotid arteries today with duplex.  No focal neurologic symptoms. Specifically, the patient denies amaurosis fugax, speech or swallowing difficulties, or arm or leg weakness or numbness. Carotid duplex today shows no significant carotid artery occlusive disease in either carotid artery.  Both vertebral arteries remain antegrade.    Current Outpatient Medications  Medication Sig Dispense Refill   albuterol (VENTOLIN HFA) 108 (90 Base) MCG/ACT inhaler SMARTSIG:2 inhalation Via Inhaler Every 6 Hours PRN     amLODipine (NORVASC) 10 MG tablet Take 10 mg by mouth daily.      atorvastatin (LIPITOR) 80 MG tablet Take 80 mg by mouth daily.   11   Cholecalciferol (VITAMIN D3) 50 MCG (2000 UT) CAPS Take 2,000 Units/day by mouth daily.     fluticasone (FLONASE) 50 MCG/ACT nasal spray Place 2 sprays into both nostrils daily as needed for allergies.     gabapentin (NEURONTIN) 300 MG capsule Take 1 capsule (300 mg total) by mouth 3 (three) times daily. (Patient taking differently: Take 300 mg by mouth 2 (two) times daily.) 90 capsule 0   isosorbide mononitrate (IMDUR)  60 MG 24 hr tablet Take 1 tablet (60 mg total) by mouth daily. 30 tablet 0   latanoprost (XALATAN) 0.005 % ophthalmic solution Place 1 drop into both eyes at bedtime.      losartan (COZAAR) 25 MG tablet Take 25 mg by mouth daily.     methocarbamol (ROBAXIN) 500 MG tablet Take by mouth.     metoprolol succinate (TOPROL-XL) 100 MG 24 hr tablet Take 100 mg by mouth daily.      Multiple Vitamins-Minerals (MULTIVITAMIN MEN 50+ PO) Take 1 tablet by mouth daily.     pantoprazole (PROTONIX) 40 MG tablet Take 1 tablet (40 mg total) by mouth 2 (two) times daily before a meal. 60 tablet 6   potassium chloride SA (K-DUR,KLOR-CON) 20 MEQ tablet Take 20 mEq by mouth daily.      rivaroxaban (XARELTO) 20 MG TABS tablet Take 1 tablet (20 mg total) by mouth daily with supper. 30 tablet 3   TRELEGY ELLIPTA 100-62.5-25 MCG/ACT AEPB Inhale into the lungs.     No current facility-administered medications for this visit.    Past Medical History:  Diagnosis Date   AAA (abdominal aortic aneurysm) without rupture (HCC)    a.) [place] 02/09/04 - 3.1 x 3.4 cm. b.) [place] 02/14/05 - 3.62 x 3.63 cm. c.) [place] 02/25/06 - 3.97 x 4.0 cm. d.) CT 07/09/11 - 4.9 cm. e.) s/p EVAR 2013. f.) enlarging AAA --> back to the OR on 09/24/2017 for placement of a 27 mm x 12 cm RIGHT iliac extension limb down  to distal CIA.   Aortic atherosclerosis (HCC)    APS (antiphospholipid syndrome) (HCC)    Arthritis    Atrial fibrillation (HCC)    a.) CHA2DS2-VASc Score = 6 (age, CHF, HTN, prior DVT x 2, prior MI). b.) on rivaroxaban   B12 deficiency 03/21/2017   Barrett's esophagus    BPH (benign prostatic hyperplasia)    CAD (coronary artery disease)    a.) LHC 11/14/95 --> 95% mLCx, 95% OM1; PTCA. b.) 4v CABG 2004 --> LIMA-LAD, SVG-D1,RCA,OM. c.) NSTEMI 05/02/2013 --> PCI with DES x 3 to SVG-OM in 2 distinct areas with filter. d.) Inf STEMI 07/30/14 - LHC --> 50% LM, 90% mLCx, 100% OM2, 100% RPDA; LIMA-LAD patent; 100% occ of SVG OM2,OM3,PDA; med mgmt.  e.) LHC 01/19/18 - patent LIMA-LAD; other grafts occ; not amenable to PCI/further bypass.   Cardiomyopathy (HCC)    Chronic anticoagulation    a.) Rivaroxaban   CLL (chronic lymphocytic leukemia) (HCC) 05/24/2017   DVT (deep venous thrombosis) (HCC)    GERD (gastroesophageal reflux disease)    HFrEF (heart failure with reduced ejection fraction) (HCC)    a.) TTE 02/22/2020 --> mod. LV systolic dysfunction; LVEF 35-40%.   History of hiatal hernia    History of shingles 2013   Hyperlipemia    Hyperplastic polyps of stomach    a.) Bx on 07/24/2020 --> well differentiated NET; WHO grade I.   Hypertension    NSTEMI (non-ST elevated myocardial infarction) (HCC) 05/02/2013   a.) LHC 05/03/2013 --> 50% LM, 100% LAD, 75% mLCx, 100% dLCx, 60% mRCA; patient LIMA-LAD, occluded SVG-D1 and SVG-PDA. SVG-OM with extensive thrombus and distal 99% lesion. HIGH RISK PCI performed at [facility] with placement of DES x 3 to 2 distinct areas of SVG with filter protection.   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 No intervention; medical management.   Valvular regurgitation    a.) TTE 02/22/2020 --> LVEF 35-40%; mild panvalvular.    Past Surgical History:  Procedure Laterality Date   ABDOMINAL AORTA STENT     BIOPSY  11/05/2021   Procedure: BIOPSY;  Surgeon: Lemar Lofty., MD;  Location: [facility];  Service:  Gastroenterology;;   CHOLECYSTECTOMY     COLONOSCOPY  11/24/2009   COLONOSCOPY N/A 10/09/2020   Procedure: COLONOSCOPY;  Surgeon: Lemar Lofty., MD;  Location: [facility];  Service: Gastroenterology;  Laterality: N/A;   COLONOSCOPY WITH PROPOFOL N/A 07/24/2020   Procedure: COLONOSCOPY WITH PROPOFOL;  Surgeon: Toledo, Boykin Nearing, MD;  Location: [facility];  Service: Gastroenterology;  Laterality: N/A;   CORONARY ANGIOPLASTY WITH STENT PLACEMENT Left 05/03/2013   Procedure: CORONARY ANGIOPLASTY WITH STENT PLACEMENT (DES x 3 to SVG-OM graft); Location: [facility]   CORONARY ARTERY BYPASS GRAFT N/A 01/21/2002   Procedure: 4V CABG (LIMA-LAD, SVG-D1, SVG-RCA, SVG-OM); Location: [facility]  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   Procedure: DUPUYTREN CONTRACTURE RELEASE LEFT LONG FINGER;  Surgeon: Christena Flake, MD;  Location: [facility];  Service: Orthopedics;  Laterality: Left;   DUPUYTREN CONTRACTURE RELEASE Right 10/18/2020   Procedure: DUPUYTREN CONTRACTURE RELEASE;  Surgeon: Christena Flake, MD;  Location: [facility];  Service: Orthopedics;  Laterality: Right;   ENDOVASCULAR REPAIR/STENT GRAFT N/A 09/24/2017   Procedure: ENDOVASCULAR REPAIR/STENT GRAFT (27 mm x 12 cm RIGHT iliac limb extension to distal CIA);  Surgeon: Annice Needy, MD;  Location: [facility];  Service: Cardiovascular;  Laterality: N/A;   ESOPHAGOGASTRODUODENOSCOPY  05/26/02  03/23/12   ESOPHAGOGASTRODUODENOSCOPY (EGD) WITH PROPOFOL N/A 10/28/2016   Procedure: ESOPHAGOGASTRODUODENOSCOPY (EGD) WITH PROPOFOL;  Surgeon: Scot Jun, MD;  Location: [facility];  Service: Endoscopy;  Laterality: N/A;   ESOPHAGOGASTRODUODENOSCOPY (EGD) WITH PROPOFOL N/A 11/05/2021   Procedure: ESOPHAGOGASTRODUODENOSCOPY (EGD) WITH PROPOFOL;  Surgeon: Meridee Score Netty Starring., MD;  Location: [facility];  Service: Gastroenterology;  Laterality: N/A;   EUS N/A 10/09/2020   Procedure: LOWER ENDOSCOPIC ULTRASOUND (EUS);  Surgeon:  Lemar Lofty., MD;  Location: [facility];  Service: Gastroenterology;  Laterality: N/A;   EUS N/A 11/05/2021   Procedure: LOWER ENDOSCOPIC ULTRASOUND (EUS);  Surgeon: Lemar Lofty., MD;  Location: [facility];  Service: Gastroenterology;  Laterality: N/A;   FLEXIBLE SIGMOIDOSCOPY N/A 11/05/2021   Procedure: FLEXIBLE SIGMOIDOSCOPY;  Surgeon: Meridee Score Netty Starring., MD;  Location: [facility];  Service: Gastroenterology;  Laterality: N/A;   HEMOSTASIS CLIP PLACEMENT  11/05/2021   Procedure: HEMOSTASIS CLIP PLACEMENT;  Surgeon: Lemar Lofty., MD;  Location: [facility];  Service: Gastroenterology;;   INCISION AND DRAINAGE ABSCESS N/A 06/14/2020   Procedure: INCISION AND DRAINAGE ABSCESS;  Surgeon: Carolan Shiver, MD;  Location: [facility];  Service: General;  Laterality: N/A;   INGUINAL HERNIA REPAIR Left    LEFT HEART CATH AND CORONARY ANGIOGRAPHY N/A 01/19/2018   Procedure: LEFT HEART CATH AND CORONARY ANGIOGRAPHY;  Surgeon: Dalia Heading, MD;  Location: [facility];  Service: Cardiovascular;  Laterality: N/A;   LEFT HEART CATH AND CORONARY ANGIOGRAPHY Left 11/14/1995   Procedure: CARDIAC CATHETERIZATION (PTCA); Location: [facility]; Surgeon: Eugenia Pancoast, MD   LEFT HEART CATH AND CORS/GRAFTS ANGIOGRAPHY Left 07/30/2014   Procedure: CARDIAC CATHETERIZATION; Location: [facility]   PERIPHERAL VASCULAR CATHETERIZATION N/A 09/06/2014   Procedure: IVC Filter Removal;  Surgeon: Renford Dills, MD;  Location: [facility];  Service: Cardiovascular;  Laterality: N/A;   POLYPECTOMY  10/09/2020   Procedure: POLYPECTOMY;  Surgeon: Meridee Score Netty Starring., MD;  Location: [facility];  Service: Gastroenterology;;   POLYPECTOMY  11/05/2021   Procedure: POLYPECTOMY;  Surgeon: Lemar Lofty., MD;  Location: [facility];  Service: Gastroenterology;;   SUBMUCOSAL LIFTING INJECTION  10/09/2020   Procedure: SUBMUCOSAL LIFTING INJECTION;  Surgeon:  Lemar Lofty., MD;  Location: [facility];  Service: Gastroenterology;;   SUBMUCOSAL TATTOO INJECTION  11/05/2021   Procedure: SUBMUCOSAL TATTOO INJECTION;  Surgeon: Lemar Lofty., MD;  Location: [facility];  Service: Gastroenterology;;   TRANSURETHRAL RESECTION OF PROSTATE N/A 02/20/2015   Procedure: TRANSURETHRAL RESECTION OF THE PROSTATE (TURP);  Surgeon: Vanna Scotland, MD;  Location: [facility];  Service: Urology;  Laterality: N/A;     Social History   Tobacco Use   Smoking status: Some Days    Current packs/day: 0.01    Average packs/day: (0.2 ttl pk-yrs)    Types: Cigarettes   Smokeless tobacco: Never   Tobacco comments:  sometimes 2-3 a day  Vaping Use   Vaping status: Never Used  Substance Use Topics   Alcohol use: Yes    Alcohol/week: 1.0 - 2.0 standard drink of alcohol    Types: 1 - 2 Shots of liquor per week    Comment: casual   Drug use: No      Family History  Problem Relation Age of Onset   Cancer Mother 46       BREAST   Diabetes Mother    Coronary artery disease Father    Heart attack Father    Prostate cancer Brother    Bladder Cancer Neg Hx    Kidney cancer Neg Hx      Allergies  Allergen Reactions   Ace Inhibitors Other (See Comments)    Other reaction(s): Cough   Amoxicillin Rash    Has patient had a PCN reaction causing immediate rash, facial/tongue/throat swelling, SOB or lightheadedness with hypotension: Yes Has patient had a PCN reaction causing severe rash involving mucus membranes or skin necrosis: Unknown Has patient had a PCN reaction that required hospitalization: Unknown Has patient had a PCN reaction occurring within the last 10 years: No If all of the above answers are "NO", then may proceed with Cephalosporin use.   Niacin Rash   Penicillins Rash    Has patient had a PCN reaction causing immediate rash, facial/tongue/throat swelling, SOB or lightheadedness with hypotension: Yes Has patient had a PCN  reaction causing severe rash involving mucus membranes or skin necrosis: Unknown Has patient had a PCN reaction that required hospitalization: Unknown Has patient had a PCN reaction occurring within the last 10 years: No If all of the above answers are "NO", then may proceed with Cephalosporin use.   Pravastatin Other (See Comments)    Other reaction(s): Muscle Pain   Rosuvastatin Other (See Comments)    Other reaction(s): Other (See Comments) GI bleed   Simvastatin Other (See Comments)    Other reaction(s): Muscle Pain    REVIEW OF SYSTEMS (Negative unless checked)   Constitutional: [] Weight loss  [] Fever  [] Chills Cardiac: [] Chest pain   [] Chest pressure   [] Palpitations   [] Shortness of breath when laying flat   [] Shortness of breath at rest   [] Shortness of breath with exertion. Vascular:  [x] Pain in legs with walking   [x] Pain in legs at rest   [] Pain in legs when laying flat   [x] Claudication   [] Pain in feet when walking  [] Pain in feet at rest  [] Pain in feet when laying flat   [] History of DVT   [] Phlebitis   [] Swelling in legs   [] Varicose veins   [] Non-healing ulcers Pulmonary:   [] Uses home oxygen   [] Productive cough   [] Hemoptysis   [] Wheeze  [] COPD   [] Asthma Neurologic:  [] Dizziness  [] Blackouts   [] Seizures   [] History of stroke   [] History of TIA  [] Aphasia   [] Temporary blindness   [] Dysphagia   [] Weakness or numbness in arms   [] Weakness or numbness in legs Musculoskeletal:  [x] Arthritis   [] Joint swelling   [x] Joint pain   [] Low back pain Hematologic:  [] Easy bruising  [] Easy bleeding   [] Hypercoagulable state   [] Anemic   Gastrointestinal:  [] Blood in stool   [] Vomiting blood  [] Gastroesophageal reflux/heartburn   [] Abdominal pain Genitourinary:  [] Chronic kidney disease   [] Difficult urination  [] Frequent urination  [] Burning with urination   [] Hematuria Skin:  [] Rashes   [] Ulcers   [] Wounds Psychological:  [] History of anxiety   []   History of major  depression.  Physical Examination  BP 136/82 (BP Location: Left Arm)   Pulse 84   Resp 19   Ht 6' (1.829 m)   Wt 216 lb (98 kg)   BMI 29.29 kg/m  Gen:  WD/WN, NAD. Appears younger than stated age. Head: [place]/AT, No temporalis wasting. Ear/Nose/Throat: Hearing grossly intact, nares w/o erythema or drainage Eyes: Conjunctiva clear. Sclera non-icteric Neck: Supple.  Trachea midline Pulmonary:  Good air movement, no use of accessory muscles.  Cardiac: RRR, no JVD Vascular:  Vessel Right Left  Radial Palpable Palpable                          PT Palpable Palpable  DP Palpable Palpable   Musculoskeletal: M/S 5/5 throughout.  No deformity or atrophy. No significant edema. Neurologic: Sensation grossly intact in extremities.  Symmetrical.  Speech is fluent.  Psychiatric: Judgment intact, Mood & affect appropriate for pt's clinical situation. Dermatologic: No rashes or ulcers noted.  No cellulitis or open wounds.      Labs No results found for this or any previous visit (from the past 2160 hour(s)).  Radiology CT Abdomen Pelvis W Contrast  Result Date: 09/18/2022 CLINICAL DATA:  Neuroendocrine tumor of the rectum. Restaging. * Tracking Code: BO * EXAM: CT ABDOMEN AND PELVIS WITH CONTRAST TECHNIQUE: Multidetector CT imaging of the abdomen and pelvis was performed using the standard protocol following bolus administration of intravenous contrast. RADIATION DOSE REDUCTION: This exam was performed according to the departmental dose-optimization program which includes automated exposure control, adjustment of the mA and/or kV according to patient size and/or use of iterative reconstruction technique. CONTRAST:  OMNIPAQUE IOHEXOL 300 MG/ML  SOLN COMPARISON:  09/11/2021 FINDINGS: Lower chest: Unremarkable. Hepatobiliary: No suspicious focal abnormality within the liver parenchyma. Cholecystectomy. No intrahepatic or extrahepatic biliary dilation. Pancreas: No focal mass lesion. No  dilatation of the main duct. No intraparenchymal cyst. No peripancreatic edema. Spleen: No splenomegaly. No suspicious focal mass lesion. Adrenals/Urinary Tract: Stable 1.8 cm right adrenal nodule, also unchanged comparing back to CT stone study 07/09/2011. 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 No followup imaging is recommended. Left adrenal gland unremarkable. Right kidney unremarkable. Stable 4.7 cm simple cyst interpolar left kidney. No followup imaging is recommended. Cortical scarring noted inferior left kidney. No evidence for hydroureter. The urinary bladder appears normal for the degree of distention. Stomach/Bowel: Stomach is unremarkable. No gastric wall thickening. No evidence of outlet obstruction. Duodenum is normally positioned as is the ligament of Treitz. No small bowel wall thickening. No small bowel dilatation. The terminal ileum is normal. The appendix is normal. No gross colonic mass. No colonic wall thickening. Mild circumferential wall thickening in the low rectum is nonspecific. Vascular/Lymphatic: Status post aorto bi iliac endograft placement. There is no gastrohepatic or hepatoduodenal ligament lymphadenopathy. No retroperitoneal or mesenteric lymphadenopathy. No pelvic sidewall lymphadenopathy. Reproductive: The prostate gland and seminal vesicles are unremarkable. Other: No intraperitoneal free fluid. Musculoskeletal: Small to moderate right groin hernia contains only fat. Tiny left groin hernia contains fat and suggests recurrent hernia given evidence of mesh in this region. No worrisome lytic or sclerotic osseous abnormality. IMPRESSION: 1. No evidence for metastatic disease in the abdomen or pelvis. 2. Mild circumferential wall thickening in the low rectum is nonspecific. 3. Stable 1.8 cm right adrenal nodule, also unchanged comparing back to CT stone study 07/09/2011. Findings remain compatible with benign etiology such as lipid poor  adenoma. No  followup imaging is recommended. 4. Small to moderate right groin hernia contains only fat. Tiny left groin hernia contains fat and suggests recurrent hernia given evidence of mesh in this region. Electronically Signed   By: Kennith Center M.D.   On: 09/18/2022 11:47   MR BRAIN W WO CONTRAST  Result Date: 09/12/2022 CLINICAL DATA:  Provided history: Vertical diplopia. Additional history provided by the scanning technologist: The patient reports migraine headaches with aura. EXAM: MRI HEAD WITHOUT AND WITH CONTRAST TECHNIQUE: Multiplanar, multiecho pulse sequences of the brain and surrounding structures were obtained without and with intravenous contrast. CONTRAST:  10mL GADAVIST GADOBUTROL 1 MMOL/ML IV SOLN COMPARISON:  Brain 10/29/2021. FINDINGS: Brain: Mild generalized cerebral atrophy. Redemonstrated small chronic cortical infarct within the left parietal lobe (series 15, image 34). Multifocal T2 FLAIR hyperintense signal abnormality within the cerebral white matter, nonspecific but compatible with mild chronic small vessel ischemic disease. Redemonstrated small chronic infarct within the left cerebellar hemisphere. There is no acute infarct. No evidence of an intracranial mass. No chronic intracranial blood products. No extra-axial fluid collection. No midline shift. No pathologic intracranial enhancement identified. Vascular: Maintained flow voids within the proximal large arterial vessels. Skull and upper cervical spine: No focal suspicious marrow lesion. Sinuses/Orbits: Bilateral proptosis. Mild mucosal thickening within the bilateral frontal, left ethmoid and left maxillary sinuses. IMPRESSION: 1. No evidence of an acute intracranial abnormality. 2. Mild chronic small vessel ischemic changes within the cerebral white matter, similar to the prior brain MRI of 10/29/2021. 3. 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 Redemonstrated small chronic infarct within the left  cerebellar hemisphere. 5. Mild generalized cerebral atrophy. 6. Bilateral proptosis. 7. Mild paranasal sinus mucosal thickening. Electronically Signed   By: Jackey Loge D.O.   On: 09/12/2022 08:40    Assessment/Plan  Benign essential HTN blood pressure control important in reducing the progression of atherosclerotic disease and aneurysmal growth. On appropriate oral medications.   Type 2 diabetes mellitus (HCC) blood glucose control important in reducing the progression of atherosclerotic disease. Also, involved in wound healing. On appropriate medications.   Abdominal aortic aneurysm (AAA) without rupture (HCC) Duplex today shows a patent stent graft without endoleak.  The aortic sac diameter is stable at 4.1 cm.  Doing well.  Several years status post repair.  Continue to follow on an annual basis with duplex.  Peripheral vascular disease (HCC) ABIs remain noncompressible but digit pressures are normal and waveforms remain multiphasic.  Continue current medical regimen.  No role for intervention.  Recheck in 1 year.  Carotid stenosis Carotid duplex today shows no significant carotid artery occlusive disease in either carotid artery.  Both vertebral arteries remain antegrade.  This can be checked every few years and follow-up with his extensive vascular history.    Festus Barren, MD  09/27/2022 3:15 PM    This note was created with Dragon medical transcription system.  Any errors from dictation are purely unintentional

## 2022-09-27 NOTE — Assessment & Plan Note (Signed)
Carotid duplex today shows no significant carotid artery occlusive disease in either carotid artery.  Both vertebral arteries remain antegrade.  This can be checked every few years and follow-up with his extensive vascular history.

## 2022-09-27 NOTE — Assessment & Plan Note (Signed)
Duplex today shows a patent stent graft without endoleak.  The aortic sac diameter is stable at 4.1 cm.  Doing well.  Several years status post repair.  Continue to follow on an annual basis with duplex.

## 2022-09-27 NOTE — Assessment & Plan Note (Signed)
blood pressure control important in reducing the progression of atherosclerotic disease and aneurysmal growth. On appropriate oral medications.

## 2022-09-30 LAB — VAS US ABI WITH/WO TBI

## 2022-10-04 ENCOUNTER — Inpatient Hospital Stay: Payer: Medicare HMO

## 2022-10-04 DIAGNOSIS — C7A026 Malignant carcinoid tumor of the rectum: Secondary | ICD-10-CM | POA: Diagnosis not present

## 2022-10-04 DIAGNOSIS — C7A8 Other malignant neuroendocrine tumors: Secondary | ICD-10-CM

## 2022-10-04 DIAGNOSIS — C9110 Chronic lymphocytic leukemia of B-cell type not having achieved remission: Secondary | ICD-10-CM

## 2022-10-04 LAB — CMP (CANCER CENTER ONLY)
ALT: 24 U/L (ref 0–44)
AST: 26 U/L (ref 15–41)
Albumin: 4 g/dL (ref 3.5–5.0)
Alkaline Phosphatase: 108 U/L (ref 38–126)
Anion gap: 9 (ref 5–15)
BUN: 11 mg/dL (ref 8–23)
CO2: 25 mmol/L (ref 22–32)
Calcium: 9.1 mg/dL (ref 8.9–10.3)
Chloride: 103 mmol/L (ref 98–111)
Creatinine: 0.85 mg/dL (ref 0.61–1.24)
GFR, Estimated: >60 mL/min (ref >60)
Glucose, Bld: 123 mg/dL — ABNORMAL HIGH (ref 70–99)
Potassium: 3.7 mmol/L (ref 3.5–5.1)
Sodium: 137 mmol/L (ref 135–145)
Total Bilirubin: 1.3 mg/dL — ABNORMAL HIGH (ref 0.3–1.2)
Total Protein: 7.3 g/dL (ref 6.5–8.1)

## 2022-10-04 LAB — CBC WITH DIFFERENTIAL (CANCER CENTER ONLY)
Abs Immature Granulocytes: 0.03 10*3/uL (ref 0.00–0.07)
Basophils Absolute: 0.1 10*3/uL (ref 0.0–0.1)
Basophils Relative: 1 %
Eosinophils Absolute: 0.1 10*3/uL (ref 0.0–0.5)
Eosinophils Relative: 2 %
HCT: 46.3 % (ref 39.0–52.0)
Hemoglobin: 15.3 g/dL (ref 13.0–17.0)
Immature Granulocytes: 1 %
Lymphocytes Relative: 42 %
Lymphs Abs: 2.6 10*3/uL (ref 0.7–4.0)
MCH: 30.6 pg (ref 26.0–34.0)
MCHC: 33 g/dL (ref 30.0–36.0)
MCV: 92.6 fL (ref 80.0–100.0)
Monocytes Absolute: 0.5 10*3/uL (ref 0.1–1.0)
Monocytes Relative: 7 %
Neutro Abs: 2.9 10*3/uL (ref 1.7–7.7)
Neutrophils Relative %: 47 %
Platelet Count: 147 10*3/uL — ABNORMAL LOW (ref 150–400)
RBC: 5 MIL/uL (ref 4.22–5.81)
RDW: 15.4 % (ref 11.5–15.5)
WBC Count: 6.1 10*3/uL (ref 4.0–10.5)
nRBC: 0 % (ref 0.0–0.2)

## 2022-10-04 LAB — LACTATE DEHYDROGENASE: LDH: 136 U/L (ref 98–192)

## 2022-10-07 LAB — CHROMOGRANIN A: Chromogranin A (ng/mL): 86.2 ng/mL (ref 0.0–101.8)

## 2022-10-09 LAB — COMP PANEL: LEUKEMIA/LYMPHOMA: Immunophenotypic Profile: 27

## 2022-10-11 ENCOUNTER — Ambulatory Visit
Admission: RE | Admit: 2022-10-11 | Discharge: 2022-10-11 | Disposition: A | Discharge status: Home / Self Care | Payer: Medicare HMO | Source: Ambulatory Visit | Attending: Internal Medicine | Admitting: Internal Medicine

## 2022-10-11 DIAGNOSIS — Z122 Encounter for screening for malignant neoplasm of respiratory organs: Secondary | ICD-10-CM | POA: Diagnosis present

## 2022-10-11 DIAGNOSIS — F17210 Nicotine dependence, cigarettes, uncomplicated: Secondary | ICD-10-CM | POA: Insufficient documentation

## 2022-10-11 DIAGNOSIS — Z87891 Personal history of nicotine dependence: Secondary | ICD-10-CM | POA: Insufficient documentation

## 2022-10-29 ENCOUNTER — Other Ambulatory Visit: Payer: Self-pay

## 2022-10-29 DIAGNOSIS — F17210 Nicotine dependence, cigarettes, uncomplicated: Secondary | ICD-10-CM

## 2022-10-29 DIAGNOSIS — Z122 Encounter for screening for malignant neoplasm of respiratory organs: Secondary | ICD-10-CM

## 2022-10-29 DIAGNOSIS — Z87891 Personal history of nicotine dependence: Secondary | ICD-10-CM

## 2022-11-01 ENCOUNTER — Encounter (HOSPITAL_COMMUNITY): Payer: Self-pay | Admitting: Gastroenterology

## 2022-11-01 ENCOUNTER — Other Ambulatory Visit: Payer: Self-pay

## 2022-11-01 NOTE — Progress Notes (Addendum)
Anesthesia Review:  PCP: Harlene Salts  Cardiologist : [place]- LOV 08/16/22  Vascular- DR Wyn Quaker- LOV 09/27/22- AAA  Pulm- DR Ned Clines  Chest x-ray : CT Chest- 10/28/22  EKG : Echo : Stress test: Cardiac Cath : 2020  Activity level: can do a flight of stairs without difficulty  Sleep Study/ CPAP : has sleep apnea  Fasting Blood Sugar :      / Checks Blood Sugar -- times a day:   Blood Thinner/ Instructions /Last Dose: ASA / Instructions/ Last Dose :    Xarelto- stop 2 days prior per pt    PreDiabetes  - on no meds

## 2022-11-10 NOTE — Anesthesia Preprocedure Evaluation (Signed)
Anesthesia Evaluation  Patient identified by MRN, date of birth, ID band Patient awake    Reviewed: Allergy & Precautions, NPO status , Patient's Chart, lab work & pertinent test results  Airway Mallampati: II  TM Distance: >3 FB Neck ROM: Full    Dental no notable dental hx. (+) Teeth Intact, Dental Advisory Given   Pulmonary Current Smoker and Patient abstained from smoking.   Pulmonary exam normal breath sounds clear to auscultation       Cardiovascular hypertension, + CAD and + Past MI  Normal cardiovascular exam Rhythm:Regular Rate:Normal     Neuro/Psych    GI/Hepatic hiatal hernia,GERD  ,,  Endo/Other    Renal/GU      Musculoskeletal  (+) Arthritis ,    Abdominal   Peds  Hematology   Anesthesia Other Findings   Reproductive/Obstetrics                             Anesthesia Physical Anesthesia Plan  ASA: 3  Anesthesia Plan: MAC   Post-op Pain Management:    Induction: Intravenous  PONV Risk Score and Plan: 2 and Treatment may vary due to age or medical condition and Propofol infusion  Airway Management Planned: Natural Airway and Nasal Cannula  Additional Equipment: None  Intra-op Plan:   Post-operative Plan:   Informed Consent: I have reviewed the patients History and Physical, chart, labs and discussed the procedure including the risks, benefits and alternatives for the proposed anesthesia with the patient or authorized representative who has indicated his/her understanding and acceptance.     Dental advisory given  Plan Discussed with: CRNA  Anesthesia Plan Comments:        Anesthesia Quick Evaluation

## 2022-11-11 ENCOUNTER — Ambulatory Visit (HOSPITAL_COMMUNITY)
Admission: RE | Admit: 2022-11-11 | Discharge: 2022-11-11 | Disposition: A | Discharge status: Home / Self Care | Payer: Medicare HMO | Attending: Gastroenterology | Admitting: Gastroenterology

## 2022-11-11 ENCOUNTER — Other Ambulatory Visit: Payer: Self-pay

## 2022-11-11 ENCOUNTER — Ambulatory Visit (HOSPITAL_BASED_OUTPATIENT_CLINIC_OR_DEPARTMENT_OTHER): Payer: Medicare HMO | Admitting: Anesthesiology

## 2022-11-11 ENCOUNTER — Ambulatory Visit (HOSPITAL_COMMUNITY): Payer: Self-pay | Admitting: Anesthesiology

## 2022-11-11 ENCOUNTER — Encounter (HOSPITAL_COMMUNITY)
Admission: RE | Disposition: A | Discharge status: Home / Self Care | Payer: Self-pay | Source: Home / Self Care | Attending: Gastroenterology

## 2022-11-11 DIAGNOSIS — K641 Second degree hemorrhoids: Secondary | ICD-10-CM | POA: Diagnosis not present

## 2022-11-11 DIAGNOSIS — Z09 Encounter for follow-up examination after completed treatment for conditions other than malignant neoplasm: Secondary | ICD-10-CM | POA: Insufficient documentation

## 2022-11-11 DIAGNOSIS — I2510 Atherosclerotic heart disease of native coronary artery without angina pectoris: Secondary | ICD-10-CM | POA: Insufficient documentation

## 2022-11-11 DIAGNOSIS — Z860102 Personal history of hyperplastic colon polyps: Secondary | ICD-10-CM | POA: Insufficient documentation

## 2022-11-11 DIAGNOSIS — C7A8 Other malignant neuroendocrine tumors: Secondary | ICD-10-CM | POA: Insufficient documentation

## 2022-11-11 DIAGNOSIS — D3A8 Other benign neuroendocrine tumors: Secondary | ICD-10-CM | POA: Diagnosis not present

## 2022-11-11 DIAGNOSIS — I110 Hypertensive heart disease with heart failure: Secondary | ICD-10-CM | POA: Diagnosis not present

## 2022-11-11 DIAGNOSIS — F17210 Nicotine dependence, cigarettes, uncomplicated: Secondary | ICD-10-CM | POA: Insufficient documentation

## 2022-11-11 DIAGNOSIS — K644 Residual hemorrhoidal skin tags: Secondary | ICD-10-CM | POA: Insufficient documentation

## 2022-11-11 DIAGNOSIS — K621 Rectal polyp: Secondary | ICD-10-CM | POA: Diagnosis not present

## 2022-11-11 DIAGNOSIS — I5022 Chronic systolic (congestive) heart failure: Secondary | ICD-10-CM | POA: Diagnosis not present

## 2022-11-11 DIAGNOSIS — D128 Benign neoplasm of rectum: Secondary | ICD-10-CM

## 2022-11-11 DIAGNOSIS — D127 Benign neoplasm of rectosigmoid junction: Secondary | ICD-10-CM | POA: Diagnosis not present

## 2022-11-11 DIAGNOSIS — Z860100 Personal history of colon polyps, unspecified: Secondary | ICD-10-CM

## 2022-11-11 DIAGNOSIS — Z9889 Other specified postprocedural states: Secondary | ICD-10-CM | POA: Diagnosis not present

## 2022-11-11 HISTORY — PX: EUS: SHX5427

## 2022-11-11 HISTORY — PX: POLYPECTOMY: SHX5525

## 2022-11-11 HISTORY — PX: FLEXIBLE SIGMOIDOSCOPY: SHX5431

## 2022-11-11 SURGERY — ULTRASOUND, LOWER GI TRACT, ENDOSCOPIC
Anesthesia: Monitor Anesthesia Care

## 2022-11-11 MED ORDER — RIVAROXABAN 20 MG PO TABS: 20.0000 mg | ORAL_TABLET | Freq: Every day | ORAL | Status: AC

## 2022-11-11 MED ORDER — PROPOFOL 10 MG/ML IV BOLUS
INTRAVENOUS | Status: DC | PRN
  Administered 2022-11-11: 50 mg via INTRAVENOUS

## 2022-11-11 MED ORDER — PROPOFOL 500 MG/50ML IV EMUL
INTRAVENOUS | Status: DC | PRN
  Administered 2022-11-11: 125 ug/kg/min via INTRAVENOUS

## 2022-11-11 MED ORDER — LIDOCAINE HCL (CARDIAC) PF 100 MG/5ML IV SOSY
PREFILLED_SYRINGE | INTRAVENOUS | Status: DC | PRN
  Administered 2022-11-11: 60 mg via INTRAVENOUS

## 2022-11-11 NOTE — Transfer of Care (Signed)
Immediate Anesthesia Transfer of Care Note  Patient: Lucita Ferrara  Procedure(s) Performed: LOWER ENDOSCOPIC ULTRASOUND (EUS) FLEXIBLE SIGMOIDOSCOPY POLYPECTOMY  Patient Location: Endoscopy Unit  Anesthesia Type:MAC  Level of Consciousness: sedated and responds to stimulation  Airway & Oxygen Therapy: Patient Spontanous Breathing and Patient connected to nasal cannula oxygen  Post-op Assessment: Report given to RN and Post -op Vital signs reviewed and stable  Post vital signs: Reviewed and stable  Last Vitals:  Vitals Value Taken Time  BP    Temp    Pulse 68 11/11/22 1246  Resp 14 11/11/22 1246  SpO2 94 % 11/11/22 1246  Vitals shown include unfiled device data.  Last Pain:  Vitals:   11/11/22 1024  TempSrc: Tympanic  PainSc: 0-No pain         Complications: No notable events documented.

## 2022-11-11 NOTE — Discharge Instructions (Signed)

## 2022-11-11 NOTE — Op Note (Signed)
[facility] Patient Name: Martin Adkins Procedure Date: 11/11/2022 MRN: [id number] Attending MD: Corliss Parish , MD, [id number] Date of Birth: 1945-01-23 CSN: [id number] Age: 77 Admit Type: Outpatient Procedure:                Lower EUS Indications:              Neuroendocrine Tumor Providers:                Corliss Parish, MD, Fransisca Connors, Harrington Challenger, Technician, Jeananne Rama, CRNA Referring MD:             Rickard Patience, MD, Boykin Nearing. Toledo MD, MD Medicines:                Monitored Anesthesia Care Complications:            No immediate complications. Estimated Blood Loss:     Estimated blood loss was minimal. Procedure:                Pre-Anesthesia Assessment:                           - Prior to the procedure, a History and Physical                            was performed, and patient medications and                            allergies were reviewed. The patient's tolerance of                            previous anesthesia was also reviewed. The risks                            and benefits of the procedure and the sedation                            options and risks were discussed with the patient.                            All questions were answered, and informed consent                            was obtained. Prior Anticoagulants: The patient has                            taken Xarelto (rivaroxaban), last dose was 2 days                            prior to procedure. ASA Grade Assessment: III - A                            patient with severe systemic disease. After  reviewing the risks and benefits, the patient was                            deemed in satisfactory condition to undergo the                            procedure.                           After obtaining informed consent, the endoscope was                            passed under direct vision. Throughout the                             procedure, the patient's blood pressure, pulse, and                            oxygen saturations were monitored continuously. The                            [id number]-[id number] ([id number]) Olympus endoscope was introduced                            through the anus and advanced to the the                            rectosigmoid junction for ultrasound. The                            [id number] ([id number]) Olympus radial ultrasound                            scope was introduced through the anus and advanced                            to the the sigmoid colon for ultrasound. The lower                            EUS was accomplished without difficulty. The                            patient tolerated the procedure. Scope In: 12:16:45 PM Scope Out: 12:39:24 PM Total Procedure Duration: 0 hours 22 minutes 39 seconds  Findings:      Skin tags were found on perianal exam.      The digital rectal exam findings include hemorrhoids. Pertinent       negatives include no palpable rectal lesions.      ENDOSCOPIC FINDING: :      Greater than 20, sessile polyps were found in the rectum and       recto-sigmoid colon. The polyps were 1 to 5 mm in size. 8 of these       polyps were removed with a cold snare. Resection and retrieval were       complete.  Multiple small post polypectomy scars were found in the rectum. There       was no evidence of the previous polyp.      Normal mucosa was found in the rest of the visualized left colon       otherwise.      Non-bleeding non-thrombosed external and internal hemorrhoids were found       during retroflexion, during perianal exam and during digital exam. The       hemorrhoids were Grade II (internal hemorrhoids that prolapse but reduce       spontaneously). Impression:               - Perianal skin tags found on perianal exam.                            Hemorrhoids found on digital rectal exam.                           - Greater than 20, 1 to 5 mm  polyps in the rectum                            and at the recto-sigmoid colon; 8 of these were                            removed with a cold snare. Resected and retrieved.                           - Post-polypectomy scar in the rectum.                           - Normal mucosa in the visualized left colon                            otherwise.                           - Non-bleeding non-thrombosed external and internal                            hemorrhoids. Moderate Sedation:      Not Applicable - Patient had care per Anesthesia. Recommendation:           - The patient will be observed post-procedure,                            until all discharge criteria are met.                           - Discharge patient to home.                           - Patient has a contact number available for                            emergencies. The signs and symptoms of potential  delayed complications were discussed with the                            patient. Return to normal activities tomorrow.                            Written discharge instructions were provided to the                            patient.                           - Resume previous diet.                           - Await path results.                           - Repeat lower endoscopic ultrasound in 1-2 years                            for surveillance if no evidence of NET is found on                            the pathology (but will also defer to Oncology                            thoughts as well as he has been doing well without                            any evidence of recurrence over the last 2-years.                           - The findings and recommendations were discussed                            with the patient. Procedure Code(s):        --- Professional ---                           [phone], Sigmoidoscopy, flexible; with endoscopic                            ultrasound examination                            [phone], Sigmoidoscopy, flexible; with removal of                            tumor(s), polyp(s), or other lesion(s) by snare                            technique Diagnosis Code(s):        --- Professional ---  D12.8, Benign neoplasm of rectum                           D12.7, Benign neoplasm of rectosigmoid junction                           Z98.890, Other specified postprocedural states                           K64.1, Second degree hemorrhoids                           K64.4, Residual hemorrhoidal skin tags CPT copyright 2022 American Medical Association. All rights reserved. The codes documented in this report are preliminary and upon coder review may  be revised to meet current compliance requirements. Corliss Parish, MD 11/11/2022 12:53:40 PM Number of Addenda: 0

## 2022-11-11 NOTE — Anesthesia Postprocedure Evaluation (Signed)
Anesthesia Post Note  Patient: Martin Adkins  Procedure(s) Performed: LOWER ENDOSCOPIC ULTRASOUND (EUS) FLEXIBLE SIGMOIDOSCOPY POLYPECTOMY     Patient location during evaluation: Endoscopy Anesthesia Type: MAC Level of consciousness: awake and alert Pain management: pain level controlled Vital Signs Assessment: post-procedure vital signs reviewed and stable Respiratory status: spontaneous breathing, nonlabored ventilation, respiratory function stable and patient connected to nasal cannula oxygen Cardiovascular status: blood pressure returned to baseline and stable Postop Assessment: no apparent nausea or vomiting Anesthetic complications: no   No notable events documented.  Last Vitals:  Vitals:   11/11/22 1300 11/11/22 1310  BP: 124/80 138/77  Pulse: 62 (!) 51  Resp: 19 18  Temp:    SpO2: 93% 94%    Last Pain:  Vitals:   11/11/22 1310  TempSrc:   PainSc: 0-No pain                 Trevor Iha

## 2022-11-11 NOTE — H&P (Signed)
GASTROENTEROLOGY PROCEDURE H&P NOTE   Primary Care Physician: Gracelyn Nurse, MD  HPI: Martin Adkins is a 77 y.o. male who presents for Lower EUS for followup rectal NET.  Past Medical History:  Diagnosis Date   AAA (abdominal aortic aneurysm) without rupture (HCC)    a.) [place] 02/09/04 - 3.1 x 3.4 cm. b.) [place] 02/14/05 - 3.62 x 3.63 cm. c.) [place] 02/25/06 - 3.97 x 4.0 cm. d.) CT 07/09/11 - 4.9 cm. e.) s/p EVAR 2013. f.) enlarging AAA --> back to the OR on 09/24/2017 for placement of a 27 mm x 12 cm RIGHT iliac extension limb down to distal CIA.   Aortic atherosclerosis (HCC)    APS (antiphospholipid syndrome) (HCC)    Arthritis    Atrial fibrillation (HCC)    a.) CHA2DS2-VASc Score = 6 (age, CHF, HTN, prior DVT x 2, prior MI). b.) on rivaroxaban   B12 deficiency 03/21/2017   Barrett's esophagus    BPH (benign prostatic hyperplasia)    CAD (coronary artery disease)    a.) LHC 11/14/95 --> 95% mLCx, 95% OM1; PTCA. b.) 4v CABG 2004 --> LIMA-LAD, SVG-D1,RCA,OM. c.) NSTEMI 05/02/2013 --> PCI with DES x 3 to SVG-OM in 2 distinct areas with filter. d.) Inf STEMI 07/30/14 - LHC --> 50% LM, 90% mLCx, 100% OM2, 100% RPDA; LIMA-LAD patent; 100% occ of SVG OM2,OM3,PDA; med mgmt. e.) LHC 01/19/18 - patent LIMA-LAD; other grafts occ; not amenable to PCI/further bypass.   Cardiomyopathy (HCC)    Chronic anticoagulation    a.) Rivaroxaban   CLL (chronic lymphocytic leukemia) (HCC) 05/24/2017   DVT (deep venous thrombosis) (HCC)    GERD (gastroesophageal reflux disease)    HFrEF (heart failure with reduced ejection fraction) (HCC)    a.) TTE 02/22/2020 --> mod. LV systolic dysfunction; LVEF 35-40%.   History of hiatal hernia    History of shingles 2013   Hyperlipemia    Hyperplastic polyps of stomach    a.) Bx on 07/24/2020 --> well differentiated NET; WHO grade I.   Hypertension    NSTEMI (non-ST elevated myocardial infarction) (HCC) 05/02/2013   a.) LHC 05/03/2013 --> 50% LM, 100% LAD, 75%  mLCx, 100% dLCx, 60% mRCA; patient LIMA-LAD, occluded SVG-D1 and SVG-PDA. SVG-OM with extensive thrombus and distal 99% lesion. HIGH RISK PCI performed at [facility] with placement of DES x 3 to 2 distinct areas of SVG with filter protection.   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 No intervention; medical management.   Valvular regurgitation    a.) TTE 02/22/2020 --> LVEF 35-40%; mild panvalvular.   Past Surgical History:  Procedure Laterality Date   ABDOMINAL AORTA STENT     BIOPSY  11/05/2021   Procedure: BIOPSY;  Surgeon: Lemar Lofty., MD;  Location: [facility];  Service: Gastroenterology;;   CHOLECYSTECTOMY     COLONOSCOPY  11/24/2009   COLONOSCOPY N/A 10/09/2020  Procedure: COLONOSCOPY;  Surgeon: Meridee Score Netty Starring., MD;  Location: [facility];  Service: Gastroenterology;  Laterality: N/A;   COLONOSCOPY WITH PROPOFOL N/A 07/24/2020   Procedure: COLONOSCOPY WITH PROPOFOL;  Surgeon: Toledo, Boykin Nearing, MD;  Location: [facility];  Service: Gastroenterology;  Laterality: N/A;   CORONARY ANGIOPLASTY WITH STENT PLACEMENT Left 05/03/2013   Procedure: CORONARY ANGIOPLASTY WITH STENT PLACEMENT (DES x 3 to SVG-OM graft); Location: [facility]   CORONARY ARTERY BYPASS GRAFT N/A 01/21/2002   Procedure: 4V CABG (LIMA-LAD, SVG-D1, SVG-RCA, SVG-OM); Location: [facility]   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   Procedure: DUPUYTREN CONTRACTURE RELEASE LEFT LONG  FINGER;  Surgeon: Christena Flake, MD;  Location: [facility];  Service: Orthopedics;  Laterality: Left;   DUPUYTREN CONTRACTURE RELEASE Right 10/18/2020   Procedure: DUPUYTREN CONTRACTURE RELEASE;  Surgeon: Christena Flake, MD;  Location: [facility];  Service: Orthopedics;  Laterality: Right;   ENDOVASCULAR REPAIR/STENT GRAFT N/A 09/24/2017   Procedure: ENDOVASCULAR REPAIR/STENT GRAFT (27 mm x 12 cm RIGHT iliac limb extension to distal CIA);  Surgeon: Annice Needy, MD;  Location: [facility];  Service: Cardiovascular;  Laterality: N/A;   ESOPHAGOGASTRODUODENOSCOPY  05/26/02  03/23/12   ESOPHAGOGASTRODUODENOSCOPY (EGD) WITH PROPOFOL N/A 10/28/2016   Procedure: ESOPHAGOGASTRODUODENOSCOPY (EGD) WITH PROPOFOL;  Surgeon: Scot Jun, MD;  Location: [facility];  Service: Endoscopy;  Laterality: N/A;   ESOPHAGOGASTRODUODENOSCOPY (EGD) WITH PROPOFOL N/A 11/05/2021   Procedure: ESOPHAGOGASTRODUODENOSCOPY (EGD) WITH PROPOFOL;  Surgeon: Meridee Score Netty Starring., MD;  Location: [facility];  Service: Gastroenterology;  Laterality: N/A;   EUS N/A 10/09/2020   Procedure: LOWER ENDOSCOPIC ULTRASOUND (EUS);  Surgeon: Lemar Lofty., MD;  Location: [facility];  Service: Gastroenterology;  Laterality: N/A;   EUS N/A 11/05/2021   Procedure: LOWER ENDOSCOPIC ULTRASOUND (EUS);  Surgeon: Lemar Lofty., MD;  Location: [facility];  Service: Gastroenterology;  Laterality: N/A;   FLEXIBLE SIGMOIDOSCOPY N/A 11/05/2021   Procedure: FLEXIBLE SIGMOIDOSCOPY;  Surgeon: Meridee Score Netty Starring., MD;  Location: [facility];  Service: Gastroenterology;  Laterality: N/A;   HEMOSTASIS CLIP PLACEMENT  11/05/2021   Procedure: HEMOSTASIS CLIP PLACEMENT;  Surgeon: Lemar Lofty., MD;  Location: [facility];  Service: Gastroenterology;;   INCISION AND DRAINAGE ABSCESS N/A 06/14/2020   Procedure: INCISION AND DRAINAGE ABSCESS;  Surgeon: Carolan Shiver, MD;  Location: [facility];  Service:  General;  Laterality: N/A;   INGUINAL HERNIA REPAIR Left    LEFT HEART CATH AND CORONARY ANGIOGRAPHY N/A 01/19/2018   Procedure: LEFT HEART CATH AND CORONARY ANGIOGRAPHY;  Surgeon: Dalia Heading, MD;  Location: [facility];  Service: Cardiovascular;  Laterality: N/A;   LEFT HEART CATH AND CORONARY ANGIOGRAPHY Left 11/14/1995   Procedure: CARDIAC CATHETERIZATION (PTCA); Location: [facility]; Surgeon: Eugenia Pancoast, MD   LEFT HEART CATH AND CORS/GRAFTS ANGIOGRAPHY Left 07/30/2014   Procedure: CARDIAC CATHETERIZATION; Location: [facility]   PERIPHERAL VASCULAR CATHETERIZATION N/A 09/06/2014   Procedure: IVC Filter Removal;  Surgeon: Renford Dills, MD;  Location: [facility];  Service: Cardiovascular;  Laterality: N/A;   POLYPECTOMY  10/09/2020   Procedure: POLYPECTOMY;  Surgeon: Meridee Score Netty Starring., MD;  Location: [facility];  Service: Gastroenterology;;   POLYPECTOMY  11/05/2021   Procedure: POLYPECTOMY;  Surgeon: Lemar Lofty., MD;  Location: [facility];  Service: Gastroenterology;;   SUBMUCOSAL LIFTING INJECTION  10/09/2020   Procedure: SUBMUCOSAL LIFTING INJECTION;  Surgeon: Lemar Lofty., MD;  Location: [facility];  Service: Gastroenterology;;   SUBMUCOSAL TATTOO INJECTION  11/05/2021   Procedure: SUBMUCOSAL  TATTOO INJECTION;  Surgeon: Lemar Lofty., MD;  Location: [facility];  Service: Gastroenterology;;   TRANSURETHRAL RESECTION OF PROSTATE N/A 02/20/2015   Procedure: TRANSURETHRAL RESECTION OF THE PROSTATE (TURP);  Surgeon: Vanna Scotland, MD;  Location: [facility];  Service: Urology;  Laterality: N/A;   No current facility-administered medications for this encounter.   No current facility-administered medications for this encounter. Allergies  Allergen Reactions   Ace Inhibitors Other (See Comments)    Other reaction(s): Cough   Amoxicillin Rash    Has patient had a PCN reaction causing immediate rash, facial/tongue/throat  swelling, SOB or lightheadedness with hypotension: Yes Has patient had a PCN reaction causing severe rash involving mucus membranes or skin necrosis: Unknown Has patient had a PCN reaction that required hospitalization: Unknown Has patient had a PCN reaction occurring within the last 10 years: No If all of the above answers are "NO", then may proceed with Cephalosporin use.   Niacin Rash   Penicillins Rash    Has patient had a PCN reaction causing immediate rash, facial/tongue/throat swelling, SOB or lightheadedness with hypotension: Yes Has patient had a PCN reaction causing severe rash involving mucus membranes or skin necrosis: Unknown Has patient had a PCN reaction that required hospitalization: Unknown Has patient had a PCN reaction occurring within the last 10 years: No If all of the above answers are "NO", then may proceed with Cephalosporin use.   Pravastatin Other (See Comments)    Other reaction(s): Muscle Pain   Rosuvastatin Other (See Comments)    Other reaction(s): Other (See Comments) GI bleed   Simvastatin Other (See Comments)    Other reaction(s): Muscle Pain   Family History  Problem Relation Age of Onset   Cancer Mother 37       BREAST   Diabetes Mother    Coronary artery disease Father    Heart attack Father    Prostate cancer Brother    Bladder Cancer Neg Hx    Kidney cancer Neg Hx    Social History   Socioeconomic History   Marital status: Legally Separated    Spouse name: Not on file   Number of children: Not on file   Years of education: Not on file   Highest education level: Not on file  Occupational History   Not on file  Tobacco Use   Smoking status: Some Days    Current packs/day: 0.01    Average packs/day: (0.2 ttl pk-yrs)    Types: Cigarettes   Smokeless tobacco: Never   Tobacco comments:    sometimes 2-3 a day  Vaping Use   Vaping status: Never Used  Substance and Sexual Activity   Alcohol use: Yes    Alcohol/week: 1.0 - 2.0 standard  drink of alcohol    Types: 1 - 2 Shots of liquor per week    Comment: casual   Drug use: No   Sexual activity: Never  Other Topics Concern   Not on file  Social History Narrative   Not on file   Social Determinants of Health   Financial Resource Strain: Low Risk  (09/24/2017)   Overall Financial Resource Strain (CARDIA)    Difficulty of Paying Living Expenses: Not hard at all  Food Insecurity: No Food Insecurity (09/24/2017)   Hunger Vital Sign    Worried About Running Out of Food in the Last Year: Never true    Ran Out of Food in the Last Year: Never true  Transportation Needs: No Transportation Needs (09/24/2017)  PRAPARE - Administrator, Civil Service (Medical): No    Lack of Transportation (Non-Medical): No  Physical Activity: Sufficiently Active (09/24/2017)   Exercise Vital Sign    Days of Exercise per Week: 7 days    Minutes of Exercise per Session: 40 min  Stress: No Stress Concern Present (09/24/2017)   [employer] of Occupational Health - Occupational Stress Questionnaire    Feeling of Stress : Not at all  Social Connections: Somewhat Isolated (09/24/2017)   Social Connection and Isolation Panel [NHANES]    Frequency of Communication with Friends and Family: More than three times a week    Frequency of Social Gatherings with Friends and Family: More than three times a week    Attends Religious Services: More than 4 times per year    Active Member of [employer] or Organizations: No    Attends Banker Meetings: Never    Marital Status: Separated  Intimate Partner Violence: Not At Risk (09/24/2017)   Humiliation, Afraid, Rape, and Kick questionnaire    Fear of Current or Ex-Partner: No    Emotionally Abused: No    Physically Abused: No    Sexually Abused: No    Physical Exam: Today's Vitals   11/11/22 1024  BP: 122/84  Pulse: 68  Resp: 20  Temp: (!) 97.5 F (36.4 C)  TempSrc: Tympanic  SpO2: 99%  Weight: 96.2 kg  Height: 6' (1.829 m)   PainSc: 0-No pain   Body mass index is 28.75 kg/m. GEN: NAD EYE: Sclerae anicteric ENT: MMM CV: Non-tachycardic GI: Soft, NT/ND NEURO:  Alert & Oriented x 3  Lab Results: No results for input(s): "WBC", "HGB", "HCT", "PLT" in the last 72 hours. BMET No results for input(s): "NA", "K", "CL", "CO2", "GLUCOSE", "BUN", "CREATININE", "CALCIUM" in the last 72 hours. LFT No results for input(s): "PROT", "ALBUMIN", "AST", "ALT", "ALKPHOS", "BILITOT", "BILIDIR", "IBILI" in the last 72 hours. PT/INR No results for input(s): "LABPROT", "INR" in the last 72 hours.   Impression / Plan: This is a 77 y.o.male who presents for Lower EUS for followup rectal NET.  The risks of an EUS including intestinal perforation, bleeding, infection, aspiration, and medication effects were discussed as was the possibility it may not give a definitive diagnosis if a biopsy is performed.    The risks and benefits of endoscopic evaluation/treatment were discussed with the patient and/or family; these include but are not limited to the risk of perforation, infection, bleeding, missed lesions, lack of diagnosis, severe illness requiring hospitalization, as well as anesthesia and sedation related illnesses.  The patient's history has been reviewed, patient examined, no change in status, and deemed stable for procedure.  The patient and/or family is agreeable to proceed.    Corliss Parish, MD [facility] Advanced [facility]oscopy Office # [phone]

## 2022-11-12 ENCOUNTER — Encounter: Payer: Self-pay | Admitting: Gastroenterology

## 2022-11-12 LAB — SURGICAL PATHOLOGY

## 2022-11-13 ENCOUNTER — Encounter (HOSPITAL_COMMUNITY): Payer: Self-pay | Admitting: Gastroenterology

## 2023-02-11 DIAGNOSIS — Z125 Encounter for screening for malignant neoplasm of prostate: Secondary | ICD-10-CM | POA: Diagnosis not present

## 2023-02-11 DIAGNOSIS — E119 Type 2 diabetes mellitus without complications: Secondary | ICD-10-CM | POA: Diagnosis not present

## 2023-02-14 DIAGNOSIS — D696 Thrombocytopenia, unspecified: Secondary | ICD-10-CM | POA: Diagnosis not present

## 2023-02-14 DIAGNOSIS — M5416 Radiculopathy, lumbar region: Secondary | ICD-10-CM | POA: Diagnosis not present

## 2023-02-14 DIAGNOSIS — M48062 Spinal stenosis, lumbar region with neurogenic claudication: Secondary | ICD-10-CM | POA: Diagnosis not present

## 2023-02-18 ENCOUNTER — Encounter: Payer: Self-pay | Admitting: Oncology

## 2023-02-18 DIAGNOSIS — I110 Hypertensive heart disease with heart failure: Secondary | ICD-10-CM | POA: Diagnosis not present

## 2023-02-18 DIAGNOSIS — Z1331 Encounter for screening for depression: Secondary | ICD-10-CM | POA: Diagnosis not present

## 2023-02-18 DIAGNOSIS — G4733 Obstructive sleep apnea (adult) (pediatric): Secondary | ICD-10-CM | POA: Diagnosis not present

## 2023-02-18 DIAGNOSIS — I5022 Chronic systolic (congestive) heart failure: Secondary | ICD-10-CM | POA: Diagnosis not present

## 2023-02-18 DIAGNOSIS — I77819 Aortic ectasia, unspecified site: Secondary | ICD-10-CM | POA: Diagnosis not present

## 2023-02-18 DIAGNOSIS — Z720 Tobacco use: Secondary | ICD-10-CM | POA: Diagnosis not present

## 2023-02-18 DIAGNOSIS — I480 Paroxysmal atrial fibrillation: Secondary | ICD-10-CM | POA: Diagnosis not present

## 2023-02-18 DIAGNOSIS — I6523 Occlusion and stenosis of bilateral carotid arteries: Secondary | ICD-10-CM | POA: Diagnosis not present

## 2023-02-18 DIAGNOSIS — Z0000 Encounter for general adult medical examination without abnormal findings: Secondary | ICD-10-CM | POA: Diagnosis not present

## 2023-02-18 DIAGNOSIS — I25810 Atherosclerosis of coronary artery bypass graft(s) without angina pectoris: Secondary | ICD-10-CM | POA: Diagnosis not present

## 2023-02-18 DIAGNOSIS — Z0001 Encounter for general adult medical examination with abnormal findings: Secondary | ICD-10-CM | POA: Diagnosis not present

## 2023-02-18 DIAGNOSIS — F17210 Nicotine dependence, cigarettes, uncomplicated: Secondary | ICD-10-CM | POA: Diagnosis not present

## 2023-02-18 DIAGNOSIS — I10 Essential (primary) hypertension: Secondary | ICD-10-CM | POA: Diagnosis not present

## 2023-02-18 DIAGNOSIS — E782 Mixed hyperlipidemia: Secondary | ICD-10-CM | POA: Diagnosis not present

## 2023-02-19 DIAGNOSIS — M159 Polyosteoarthritis, unspecified: Secondary | ICD-10-CM | POA: Diagnosis not present

## 2023-02-19 DIAGNOSIS — M5416 Radiculopathy, lumbar region: Secondary | ICD-10-CM | POA: Diagnosis not present

## 2023-02-19 DIAGNOSIS — M7551 Bursitis of right shoulder: Secondary | ICD-10-CM | POA: Diagnosis not present

## 2023-03-03 DIAGNOSIS — D696 Thrombocytopenia, unspecified: Secondary | ICD-10-CM | POA: Diagnosis not present

## 2023-03-04 DIAGNOSIS — M48062 Spinal stenosis, lumbar region with neurogenic claudication: Secondary | ICD-10-CM | POA: Diagnosis not present

## 2023-03-04 DIAGNOSIS — M5416 Radiculopathy, lumbar region: Secondary | ICD-10-CM | POA: Diagnosis not present

## 2023-03-13 ENCOUNTER — Encounter: Payer: Self-pay | Admitting: Oncology

## 2023-03-20 ENCOUNTER — Encounter: Payer: Self-pay | Admitting: Oncology

## 2023-03-20 ENCOUNTER — Inpatient Hospital Stay: Payer: Medicare HMO | Attending: Oncology | Admitting: Oncology

## 2023-03-20 VITALS — BP 128/82 | HR 70 | Temp 97.3°F | Resp 19 | Wt 215.8 lb

## 2023-03-20 DIAGNOSIS — E785 Hyperlipidemia, unspecified: Secondary | ICD-10-CM | POA: Diagnosis not present

## 2023-03-20 DIAGNOSIS — R161 Splenomegaly, not elsewhere classified: Secondary | ICD-10-CM | POA: Diagnosis not present

## 2023-03-20 DIAGNOSIS — M069 Rheumatoid arthritis, unspecified: Secondary | ICD-10-CM | POA: Diagnosis not present

## 2023-03-20 DIAGNOSIS — F17210 Nicotine dependence, cigarettes, uncomplicated: Secondary | ICD-10-CM | POA: Diagnosis not present

## 2023-03-20 DIAGNOSIS — I739 Peripheral vascular disease, unspecified: Secondary | ICD-10-CM | POA: Insufficient documentation

## 2023-03-20 DIAGNOSIS — G4733 Obstructive sleep apnea (adult) (pediatric): Secondary | ICD-10-CM | POA: Insufficient documentation

## 2023-03-20 DIAGNOSIS — C7A8 Other malignant neuroendocrine tumors: Secondary | ICD-10-CM | POA: Diagnosis not present

## 2023-03-20 DIAGNOSIS — Z7982 Long term (current) use of aspirin: Secondary | ICD-10-CM | POA: Diagnosis not present

## 2023-03-20 DIAGNOSIS — I4811 Longstanding persistent atrial fibrillation: Secondary | ICD-10-CM

## 2023-03-20 DIAGNOSIS — D125 Benign neoplasm of sigmoid colon: Secondary | ICD-10-CM | POA: Insufficient documentation

## 2023-03-20 DIAGNOSIS — C9110 Chronic lymphocytic leukemia of B-cell type not having achieved remission: Secondary | ICD-10-CM | POA: Insufficient documentation

## 2023-03-20 DIAGNOSIS — I4891 Unspecified atrial fibrillation: Secondary | ICD-10-CM | POA: Insufficient documentation

## 2023-03-20 DIAGNOSIS — K5730 Diverticulosis of large intestine without perforation or abscess without bleeding: Secondary | ICD-10-CM | POA: Insufficient documentation

## 2023-03-20 DIAGNOSIS — I700 Atherosclerosis of aorta: Secondary | ICD-10-CM | POA: Insufficient documentation

## 2023-03-20 DIAGNOSIS — I110 Hypertensive heart disease with heart failure: Secondary | ICD-10-CM | POA: Diagnosis not present

## 2023-03-20 DIAGNOSIS — Z7901 Long term (current) use of anticoagulants: Secondary | ICD-10-CM | POA: Insufficient documentation

## 2023-03-20 DIAGNOSIS — I5022 Chronic systolic (congestive) heart failure: Secondary | ICD-10-CM | POA: Diagnosis not present

## 2023-03-20 DIAGNOSIS — K760 Fatty (change of) liver, not elsewhere classified: Secondary | ICD-10-CM | POA: Diagnosis not present

## 2023-03-20 DIAGNOSIS — I2510 Atherosclerotic heart disease of native coronary artery without angina pectoris: Secondary | ICD-10-CM | POA: Insufficient documentation

## 2023-03-20 DIAGNOSIS — Z86718 Personal history of other venous thrombosis and embolism: Secondary | ICD-10-CM | POA: Insufficient documentation

## 2023-03-20 DIAGNOSIS — Z8719 Personal history of other diseases of the digestive system: Secondary | ICD-10-CM | POA: Insufficient documentation

## 2023-03-20 DIAGNOSIS — Z85048 Personal history of other malignant neoplasm of rectum, rectosigmoid junction, and anus: Secondary | ICD-10-CM | POA: Diagnosis not present

## 2023-03-20 DIAGNOSIS — I429 Cardiomyopathy, unspecified: Secondary | ICD-10-CM | POA: Diagnosis not present

## 2023-03-20 DIAGNOSIS — Z79899 Other long term (current) drug therapy: Secondary | ICD-10-CM | POA: Diagnosis not present

## 2023-03-20 DIAGNOSIS — I252 Old myocardial infarction: Secondary | ICD-10-CM | POA: Diagnosis not present

## 2023-03-20 NOTE — Assessment & Plan Note (Signed)
#  CLL, stable.  Patient has been in remission.  Off treatment since November 2019. Lymphocytosis 4.7, Early recurrence. He is asymptomatic without constitutional symptoms.. Continue watchful observation.

## 2023-03-20 NOTE — Progress Notes (Addendum)
 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  I clarified with radiology Dr. Areatha Ku.  An addendum was added that the T staging [ post resection] should be T0, no muscularis propia invasion.  10/2020 Flex sigmoidoscopy by Dr.Mansouraty did not reveal significant pathology at the rectosigmoid junction.  Flex sigmoidoscopy in Oct 2023-report not available to me. Check CT abdomen pelvis with contrast annually-next due Oct 2025 CT. Continue follow up with GI   Addendum chromogranin A was elevated. called patient and he told me that he tried to hold off Protonix  and took Tums instead. However Tums made him constipated so he went back on Protonix  prior to checking chromogranin A level. This explains the increase.    He will stop Protonix  for 2 weeks and repeat chromogranin A level.  CLL (chronic lymphocytic leukemia) (HCC) #CLL, stable.  Patient has been in remission.  Off treatment since November 2019. Lymphocytosis 4.7, Early recurrence. He is asymptomatic without constitutional symptoms.. Continue watchful observation.   Atrial fibrillation (HCC) on chronic anticoagulation with Xarelto . Follow-up with cardiology.   Orders Placed This Encounter  Procedures   Flow cytometry panel-leukemia/lymphoma work-up    Standing Status:   Future    Expected Date:   04/07/2023    Expiration Date:   03/19/2024   Chromogranin A    Standing Status:   Future    Expected Date:   04/07/2023    Expiration Date:   03/19/2024   CBC with Differential (Cancer Center Only)    Standing Status:   Future    Expected Date:   04/07/2023    Expiration Date:   03/19/2024   Follow-up in 6 months. All questions were answered.  The patient knows to call the clinic with any problems, questions or concerns.  [name], MD, PhD [facility] Hematology Oncology 03/20/2023    HISTORY OF PRESENTING ILLNESS:  Martin Adkins is a  77 y.o.  male with PMH listed below who was referred to me for evaluation of lymphocytosis.  Recent lab work on 03/05/2017 showed wbc 11.4, hb 14.6, platelet count 117,000, lymphocytosis 63.5%, neutrophil 22%, Report chronic fatigue, no weight loss, fever or chills.  He reports feeling chest "rattleness". Has for CT chest which showed acute finding, chronic finding includes  postsurgical changes consistent with coronary bypass grafting. One of the bypass grafts arising from the aorta shows apparent thrombosis and an area of distal aneurysmal dilatation which is also Thrombosed. Right adrenal adenoma stable in appearance. Mild scarring in the lung bases.Fatty liver.   Takes Aspirin  81mg , coumadine, plavix . He is on anticoagulation for previous history of clots.  Denies any bleeding events. Use to drink hard liquir daily, quitted for 4-5 years. Current drinks wine on weekend.    Image Studies # 04/04/2017  US  abdomen showed 1.  Splenomegaly.  No focal splenic lesions evident. 2. Increased liver echogenicity, a finding most likely indicative of hepatic steatosis. While no focal liver lesions are evident on this study, it must be cautioned that the sensitivity of ultrasound for detection of focal liver lesions is diminished in this circumstance.3. Pancreas obscured by gas. Portions of inferior vena cava obscured by gas.4.  Gallbladder absent.5. Status post abdominal aortic aneurysm repair. No periaortic fluid.  # 04/21/2017  PET scan 1. Moderate splenomegaly with uniform splenic hypermetabolism,compatible with the provided history of lymphoproliferative disease.No additional hypermetabolic sites of lymphoproliferative disease. Specifically, no hypermetabolic lymphadenopathy Hepatic steatosis, aortic  atherosclerosis. Aneurysm.    # CLL, stage IV disease given spelencemagly, thrombocytopenia, symptomatic with fatigue and weight loss.  CLL IPL score 4, high risk.   # status post abdominal aortic aneurysm repair on 09/25/2017 s/p cardiac cath which revealed patent LIMA to the LAD, no significant disease in the RCA with a 90% stenosis in a small RV marginal branch.  Occluded left circumflex, occluded LAD.  Saphenous vein grafts are all occluded.  RCA does not require grafting.  LAD is well grafted by the left internal mammary.  Medical management  07/24/2020, colonoscopy showed diverticulosis in the sigmoid colon.  Multiple polyps were removed from descending colon, proximal ascending colon, transverse colon, sigmoid colon and 5 small 2-74mm polyps resected from rectum. Polyps from the colon are tubular adenoma, negative for high-grade dysplasia and malignancy. Polyps from rectum showed well differentiated neuroendocrine tumor, grade 1, involving the base of the submitted tissue-1 fragment, hyperplastic polyp 2 fragments.  10/09/2020 EUS by Dr.Mansouraty  FLEX Impression: Stool in the sigmoid colon and in the descending colon. - Preparation of the colon was fair even after significant lavage - however large stool began to come through the rest of the colon as procedure continued. - >30 1 to 5 mm polyps in the rectum, at the recto-sigmoid colon and in the sigmoid colon. 16 of the polyps which were in the rectum were removed with a cold snare. Resected and retrieved.- Non-bleeding non-thrombosed external and internal hemorrhoids  EUS Impression: - Endosonographic imaging showed no sign of significant pathology at the rectosigmoid junction. - Endosonographic images of the rectum were unremarkable. - Endosonographic images of the perirectal space were unremarkable. - No malignant-appearing lymph nodes were visualized endosonographically in the perirectal region and in the left iliac region. - The  internal anal sphincter was visualized endosonographically and appeared normal  Dr.Mansouraty recommend repeat 1 year Flex sigmoidoscopy with EUS  INTERVAL HISTORY Martin Adkins is a 78 y.o. male who has above presents for follow-up of CLL, rectal neuroendocrine tumor. He has been off venetoclax  since 11/11/2017. Patient denies unintentional weight loss, night sweats, fever.  Appetite is good. No new complaints. He takes protonix  long term.   Review of Systems  Constitutional:  Negative for appetite change, chills, fatigue, fever and unexpected weight change.  HENT:   Negative for hearing loss and voice change.   Eyes:  Negative for eye problems and icterus.  Respiratory:  Negative for chest tightness, cough, shortness of breath and wheezing.   Cardiovascular:  Negative for chest pain and leg swelling.  Gastrointestinal:  Negative for abdominal distention and abdominal pain.  Endocrine: Negative for hot flashes.  Genitourinary:  Negative for difficulty urinating, dysuria and frequency.   Musculoskeletal:  Negative for arthralgias and back pain.  Skin:  Negative for itching and rash.  Neurological:  Positive for numbness. Negative for dizziness, headaches and light-headedness.  Hematological:  Negative for adenopathy. Does not bruise/bleed easily.  Psychiatric/Behavioral:  Negative for confusion.      MEDICAL HISTORY:  Past Medical History:  Diagnosis Date   AAA (abdominal aortic aneurysm) without rupture (HCC)    a.) US  02/09/04 - 3.1 x 3.4 cm. b.) US  02/14/05 - 3.62 x 3.63 cm. c.) US  02/25/06 - 3.97 x 4.0 cm. d.) CT 07/09/11 - 4.9 cm. e.) s/p EVAR 2013. f.) enlarging  AAA --> back to the OR on 09/24/2017 for placement of a 27 mm x 12 cm RIGHT iliac extension limb down to distal CIA.   Aortic atherosclerosis (HCC)    APS (antiphospholipid syndrome) (HCC)    Arthritis    Atrial fibrillation (HCC)    a.) CHA2DS2-VASc Score = 6 (age, CHF, HTN, prior DVT x 2, prior MI). b.) on  rivaroxaban    B12 deficiency 03/21/2017   Barrett's esophagus    BPH (benign prostatic hyperplasia)    CAD (coronary artery disease)    a.) LHC 11/14/95 --> 95% mLCx, 95% OM1; PTCA. b.) 4v CABG 2004 --> LIMA-LAD, SVG-D1,RCA,OM. c.) NSTEMI 05/02/2013 --> PCI with DES x 3 to SVG-OM in 2 distinct areas with filter. d.) Inf STEMI 07/30/14 - LHC --> 50% LM, 90% mLCx, 100% OM2, 100% RPDA; LIMA-LAD patent; 100% occ of SVG OM2,OM3,PDA; med mgmt. e.) LHC 01/19/18 - patent LIMA-LAD; other grafts occ; not amenable to PCI/further bypass.   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 LV systolic dysfunction; LVEF 35-40%.   History of hiatal hernia    History of shingles 2013   Hyperlipemia    Hyperplastic polyps of stomach    a.) Bx on 07/24/2020 --> well differentiated NET; WHO grade I.   Hypertension    NSTEMI (non-ST elevated myocardial infarction) (HCC) 05/02/2013   a.) LHC 05/03/2013 --> 50% LM, 100% LAD, 75% mLCx, 100% dLCx, 60% mRCA; patient LIMA-LAD, occluded SVG-D1 and SVG-PDA. SVG-OM with extensive thrombus and distal 99% lesion. HIGH RISK PCI performed at [facility] with placement of DES x 3 to 2 distinct areas of SVG with filter protection.   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 No intervention; medical management.   Valvular regurgitation    a.) TTE 02/22/2020 --> LVEF 35-40%; mild panvalvular.    SURGICAL HISTORY: Past Surgical History:  Procedure Laterality Date   ABDOMINAL AORTA STENT     BIOPSY  11/05/2021   Procedure: BIOPSY;  Surgeon: Brice Campi Albino Alu., MD;  Location: [facility];  Service: Gastroenterology;;   CHOLECYSTECTOMY     COLONOSCOPY  11/24/2009   COLONOSCOPY N/A 10/09/2020   Procedure: COLONOSCOPY;  Surgeon: Normie Becton., MD;  Location: [facility];  Service: Gastroenterology;  Laterality: N/A;   COLONOSCOPY WITH PROPOFOL  N/A 07/24/2020   Procedure: COLONOSCOPY WITH PROPOFOL ;  Surgeon: Toledo, Alphonsus Jeans, MD;  Location: [facility];  Service: Gastroenterology;  Laterality: N/A;   CORONARY ANGIOPLASTY WITH STENT PLACEMENT Left 05/03/2013   Procedure: CORONARY ANGIOPLASTY WITH STENT PLACEMENT (DES x 3 to SVG-OM graft); Location: [facility]   CORONARY ARTERY BYPASS GRAFT N/A 01/21/2002  Procedure: 4V CABG (LIMA-LAD, SVG-D1, SVG-RCA, SVG-OM); Location: [facility]   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   Procedure: DUPUYTREN CONTRACTURE RELEASE LEFT LONG FINGER;  Surgeon: Elner Hahn, MD;  Location: [facility];  Service: Orthopedics;  Laterality: Left;   DUPUYTREN CONTRACTURE RELEASE Right 10/18/2020   Procedure: DUPUYTREN CONTRACTURE RELEASE;  Surgeon: Elner Hahn, MD;  Location: [facility];  Service: Orthopedics;  Laterality: Right;   ENDOVASCULAR REPAIR/STENT GRAFT N/A 09/24/2017   Procedure: ENDOVASCULAR REPAIR/STENT GRAFT (27 mm x 12 cm RIGHT iliac limb extension to distal CIA);  Surgeon: Celso College, MD;  Location: [facility];  Service: Cardiovascular;  Laterality: N/A;   ESOPHAGOGASTRODUODENOSCOPY  05/26/02  03/23/12   ESOPHAGOGASTRODUODENOSCOPY (EGD) WITH PROPOFOL  N/A 10/28/2016   Procedure: ESOPHAGOGASTRODUODENOSCOPY (EGD) WITH PROPOFOL ;  Surgeon:  Cassie Click, MD;  Location: [facility];  Service: Endoscopy;  Laterality: N/A;   ESOPHAGOGASTRODUODENOSCOPY (EGD) WITH PROPOFOL  N/A 11/05/2021   Procedure: ESOPHAGOGASTRODUODENOSCOPY (EGD) WITH PROPOFOL ;  Surgeon: Brice Campi Albino Alu., MD;  Location: [facility];  Service: Gastroenterology;  Laterality: N/A;   EUS N/A 10/09/2020   Procedure: LOWER ENDOSCOPIC ULTRASOUND (EUS);  Surgeon: Normie Becton., MD;  Location: [facility];  Service: Gastroenterology;  Laterality: N/A;   EUS N/A 11/05/2021   Procedure: LOWER ENDOSCOPIC ULTRASOUND (EUS);  Surgeon: Normie Becton., MD;  Location: [facility];  Service: Gastroenterology;  Laterality: N/A;   EUS N/A 11/11/2022   Procedure: LOWER ENDOSCOPIC ULTRASOUND (EUS);  Surgeon: Normie Becton., MD;  Location: [facility];  Service: Gastroenterology;  Laterality: N/A;   FLEXIBLE SIGMOIDOSCOPY N/A 11/05/2021   Procedure: FLEXIBLE SIGMOIDOSCOPY;  Surgeon: Brice Campi Albino Alu., MD;  Location: [facility];  Service: Gastroenterology;  Laterality: N/A;   FLEXIBLE SIGMOIDOSCOPY N/A 11/11/2022   Procedure: FLEXIBLE SIGMOIDOSCOPY;  Surgeon: Brice Campi Albino Alu., MD;  Location: [facility];  Service: Gastroenterology;  Laterality: N/A;   HEMOSTASIS CLIP PLACEMENT  11/05/2021   Procedure: HEMOSTASIS CLIP PLACEMENT;  Surgeon: Normie Becton., MD;  Location: [facility];  Service: Gastroenterology;;   INCISION AND DRAINAGE ABSCESS N/A 06/14/2020   Procedure: INCISION AND DRAINAGE ABSCESS;  Surgeon: Eldred Grego, MD;  Location: [facility];  Service: General;  Laterality: N/A;   INGUINAL HERNIA REPAIR Left    LEFT HEART CATH AND CORONARY ANGIOGRAPHY N/A 01/19/2018   Procedure: LEFT HEART CATH AND CORONARY ANGIOGRAPHY;  Surgeon: Ronney Cola, MD;  Location: [facility];  Service: Cardiovascular;  Laterality: N/A;   LEFT HEART CATH AND CORONARY ANGIOGRAPHY Left 11/14/1995   Procedure: CARDIAC  CATHETERIZATION (PTCA); Location: [facility]; Surgeon: Taffy Fabian, MD   LEFT HEART CATH AND CORS/GRAFTS ANGIOGRAPHY Left 07/30/2014   Procedure: CARDIAC CATHETERIZATION; Location: [facility]   PERIPHERAL VASCULAR CATHETERIZATION N/A 09/06/2014   Procedure: IVC Filter Removal;  Surgeon: Jackquelyn Mass, MD;  Location: [facility];  Service: Cardiovascular;  Laterality: N/A;   POLYPECTOMY  10/09/2020   Procedure: POLYPECTOMY;  Surgeon: Brice Campi Albino Alu., MD;  Location: [facility];  Service: Gastroenterology;;   POLYPECTOMY  11/05/2021   Procedure: POLYPECTOMY;  Surgeon: Normie Becton., MD;  Location: [facility];  Service: Gastroenterology;;   POLYPECTOMY  11/11/2022   Procedure: POLYPECTOMY;  Surgeon: Normie Becton., MD;  Location: [facility];  Service: Gastroenterology;;   SUBMUCOSAL LIFTING INJECTION  10/09/2020   Procedure: SUBMUCOSAL LIFTING INJECTION;  Surgeon: Normie Becton., MD;  Location: [facility];  Service: Gastroenterology;;   SUBMUCOSAL TATTOO INJECTION  11/05/2021   Procedure: SUBMUCOSAL TATTOO INJECTION;  Surgeon: Brice Campi,  Albino Alu., MD;  Location: [facility];  Service: Gastroenterology;;   TRANSURETHRAL RESECTION OF PROSTATE N/A 02/20/2015   Procedure: TRANSURETHRAL RESECTION OF THE PROSTATE (TURP);  Surgeon: Dustin Gimenez, MD;  Location: [facility];  Service: Urology;  Laterality: N/A;    SOCIAL HISTORY: Social History   Socioeconomic History   Marital status: Legally Separated    Spouse name: Not on file   Number of children: Not on file   Years of education: Not on file   Highest education level: Not on file  Occupational History   Not on file  Tobacco Use   Smoking status: Some Days    Current packs/day: 0.01    Average packs/day: (0.2 ttl pk-yrs)    Types: Cigarettes   Smokeless tobacco: Never   Tobacco comments:    sometimes 2-3 a day  Vaping Use   Vaping status: Never Used  Substance and Sexual Activity    Alcohol  use: Yes    Alcohol /week: 1.0 - 2.0 standard drink of alcohol     Types: 1 - 2 Shots of liquor per week    Comment: casual   Drug use: No   Sexual activity: Never  Other Topics Concern   Not on file  Social History Narrative   Not on file   Social Drivers of Health   Financial Resource Strain: Low Risk  (09/24/2017)   Overall Financial Resource Strain (CARDIA)    Difficulty of Paying Living Expenses: Not hard at all  Food Insecurity: No Food Insecurity (09/24/2017)   Hunger Vital Sign    Worried About Running Out of Food in the Last Year: Never true    Ran Out of Food in the Last Year: Never true  Transportation Needs: No Transportation Needs (09/24/2017)   PRAPARE - Administrator, Civil Service (Medical): No    Lack of Transportation (Non-Medical): No  Physical Activity: Sufficiently Active (09/24/2017)   Exercise Vital Sign    Days of Exercise per Week: 7 days    Minutes of Exercise per Session: 40 min  Stress: No Stress Concern Present (09/24/2017)   [employer] of Occupational Health - Occupational Stress Questionnaire    Feeling of Stress : Not at all  Social Connections: Somewhat Isolated (09/24/2017)   Social Connection and Isolation Panel [NHANES]    Frequency of Communication with Friends and Family: More than three times a week    Frequency of Social Gatherings with Friends and Family: More than three times a week    Attends Religious Services: More than 4 times per year    Active Member of [employer] or Organizations: No    Attends Banker Meetings: Never    Marital Status: Separated  Intimate Partner Violence: Not At Risk (09/24/2017)   Humiliation, Afraid, Rape, and Kick questionnaire    Fear of Current or Ex-Partner: No    Emotionally Abused: No    Physically Abused: No    Sexually Abused: No    FAMILY HISTORY: Family History  Problem Relation Age of Onset   Cancer Mother 89       BREAST   Diabetes Mother    Coronary artery  disease Father    Heart attack Father    Prostate cancer Brother    Bladder Cancer Neg Hx    Kidney cancer Neg Hx     ALLERGIES:  is allergic to ace inhibitors, amoxicillin, niacin, penicillins, pravastatin, rosuvastatin, and simvastatin.  MEDICATIONS:  Current Outpatient Medications  Medication Sig Dispense Refill  amLODipine  (NORVASC ) 10 MG tablet Take 10 mg by mouth daily.      atorvastatin  (LIPITOR ) 80 MG tablet Take 80 mg by mouth daily.   11   Cholecalciferol (VITAMIN D3) 50 MCG (2000 UT) CAPS Take 2,000 Units/day by mouth daily.     fluticasone  (FLONASE ) 50 MCG/ACT nasal spray Place 2 sprays into both nostrils daily as needed for allergies.     gabapentin  (NEURONTIN ) 300 MG capsule Take 1 capsule (300 mg total) by mouth 3 (three) times daily. (Patient taking differently: Take 300 mg by mouth 2 (two) times daily.) 90 capsule 0   isosorbide  mononitrate (IMDUR ) 60 MG 24 hr tablet Take 1 tablet (60 mg total) by mouth daily. 30 tablet 0   latanoprost  (XALATAN ) 0.005 % ophthalmic solution Place 1 drop into both eyes at bedtime.      losartan  (COZAAR ) 25 MG tablet Take 25 mg by mouth daily.     methocarbamol (ROBAXIN) 500 MG tablet Take by mouth.     metoprolol  succinate (TOPROL -XL) 100 MG 24 hr tablet Take 100 mg by mouth daily.      Multiple Vitamins-Minerals (MULTIVITAMIN MEN 50+ PO) Take 1 tablet by mouth daily.     pantoprazole  (PROTONIX ) 40 MG tablet Take 1 tablet (40 mg total) by mouth 2 (two) times daily before a meal. 60 tablet 6   potassium chloride  SA (K-DUR,KLOR-CON ) 20 MEQ tablet Take 20 mEq by mouth daily.      rivaroxaban  (XARELTO ) 20 MG TABS tablet Take 1 tablet (20 mg total) by mouth daily with supper.     TRELEGY ELLIPTA 100-62.5-25 MCG/ACT AEPB Inhale into the lungs.     albuterol  (VENTOLIN  HFA) 108 (90 Base) MCG/ACT inhaler SMARTSIG:2 inhalation Via Inhaler Every 6 Hours PRN (Patient not taking: Reported on 03/20/2023)     No current facility-administered medications  for this visit.     PHYSICAL EXAMINATION: ECOG 1 Vitals:   03/20/23 1030  BP: 128/82  Pulse: 70  Resp: 19  Temp: (!) 97.3 F (36.3 C)  SpO2: 98%  Physical Exam Vitals and nursing note reviewed.  Constitutional:      General: He is not in acute distress. HENT:     Head: Normocephalic and atraumatic.     Mouth/Throat:     Pharynx: No oropharyngeal exudate.  Eyes:     General: No scleral icterus. Neck:     Vascular: No JVD.  Cardiovascular:     Rate and Rhythm: Normal rate.     Heart sounds: Normal heart sounds. No murmur heard. Pulmonary:     Effort: Pulmonary effort is normal. No respiratory distress.     Breath sounds: No wheezing or rales.  Chest:     Chest wall: No tenderness.  Abdominal:     General: There is no distension.     Palpations: Abdomen is soft.     Tenderness: There is no abdominal tenderness.  Musculoskeletal:        General: No deformity. Normal range of motion.     Cervical back: Normal range of motion and neck supple.  Lymphadenopathy:     Cervical: No cervical adenopathy.  Skin:    General: Skin is warm and dry.     Findings: No erythema.  Neurological:     Mental Status: He is alert and oriented to person, place, and time. Mental status is at baseline.     Cranial Nerves: No cranial nerve deficit.     Motor: No abnormal muscle tone.  Psychiatric:  Mood and Affect: Mood and affect normal.      LABORATORY DATA:  I have reviewed the data as listed    Latest Ref Rng & Units 10/04/2022    8:46 AM 03/05/2022    8:54 AM 08/31/2021   11:46 AM  CBC  WBC 4.0 - 10.5 K/uL 6.1  9.1  9.2   Hemoglobin 13.0 - 17.0 g/dL 16.1  09.6  04.5   Hematocrit 39.0 - 52.0 % 46.3  49.3  45.3   Platelets 150 - 400 K/uL 147  136  143    .cmpl  Hepatitis panel negative.  LDH 228, mildly elevated Beta 2 microglobulin normal.   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 The morphologic and  immunophenotypic features are most consistent with chronic lymphocytic leukemia/small lymphocytic lymphoma. Bone marrow Cytogenetics : normal.  Peripheral blood FISH panel negative for CCND1- IGH mutation, ATM, 12, 13q, Tp53 mutation.   RADIOGRAPHIC STUDIES: I have personally reviewed the radiological images as listed and agreed with the findings in the report. No results found.

## 2023-03-20 NOTE — Progress Notes (Signed)
 Patient has no concerns

## 2023-03-20 NOTE — Assessment & Plan Note (Signed)
on chronic anticoagulation with Xarelto. Follow-up with cardiology.

## 2023-03-20 NOTE — Assessment & Plan Note (Addendum)
 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  I clarified with radiology Dr. Areatha Ku.  An addendum was added that the T staging [ post resection] should be T0, no muscularis propia invasion.  10/2020 Flex sigmoidoscopy by Dr.Mansouraty did not reveal significant pathology at the rectosigmoid junction.  Flex sigmoidoscopy in Oct 2023-report not available to me. Check CT abdomen pelvis with contrast annually-next due Oct 2025 CT. Continue follow up with GI   Addendum chromogranin A was elevated. called patient and he told me that he tried to hold off Protonix  and took Tums instead. However Tums made him constipated so he went back on Protonix  prior to checking chromogranin A level. This explains the increase.    He will stop Protonix  for 2 weeks and repeat chromogranin A level.

## 2023-04-07 ENCOUNTER — Inpatient Hospital Stay

## 2023-04-07 DIAGNOSIS — C9110 Chronic lymphocytic leukemia of B-cell type not having achieved remission: Secondary | ICD-10-CM | POA: Diagnosis not present

## 2023-04-07 DIAGNOSIS — R161 Splenomegaly, not elsewhere classified: Secondary | ICD-10-CM | POA: Diagnosis not present

## 2023-04-07 DIAGNOSIS — K5730 Diverticulosis of large intestine without perforation or abscess without bleeding: Secondary | ICD-10-CM | POA: Diagnosis not present

## 2023-04-07 DIAGNOSIS — Z7901 Long term (current) use of anticoagulants: Secondary | ICD-10-CM | POA: Diagnosis not present

## 2023-04-07 DIAGNOSIS — Z7982 Long term (current) use of aspirin: Secondary | ICD-10-CM | POA: Diagnosis not present

## 2023-04-07 DIAGNOSIS — K760 Fatty (change of) liver, not elsewhere classified: Secondary | ICD-10-CM | POA: Diagnosis not present

## 2023-04-07 DIAGNOSIS — I700 Atherosclerosis of aorta: Secondary | ICD-10-CM | POA: Diagnosis not present

## 2023-04-07 DIAGNOSIS — Z85048 Personal history of other malignant neoplasm of rectum, rectosigmoid junction, and anus: Secondary | ICD-10-CM | POA: Diagnosis not present

## 2023-04-07 DIAGNOSIS — I4891 Unspecified atrial fibrillation: Secondary | ICD-10-CM | POA: Diagnosis not present

## 2023-04-07 LAB — CBC WITH DIFFERENTIAL (CANCER CENTER ONLY)
Abs Immature Granulocytes: 0.01 10*3/uL (ref 0.00–0.07)
Basophils Absolute: 0 10*3/uL (ref 0.0–0.1)
Basophils Relative: 0 %
Eosinophils Absolute: 0.1 10*3/uL (ref 0.0–0.5)
Eosinophils Relative: 1 %
HCT: 46.9 % (ref 39.0–52.0)
Hemoglobin: 15.2 g/dL (ref 13.0–17.0)
Immature Granulocytes: 0 %
Lymphocytes Relative: 50 %
Lymphs Abs: 3.5 10*3/uL (ref 0.7–4.0)
MCH: 30.2 pg (ref 26.0–34.0)
MCHC: 32.4 g/dL (ref 30.0–36.0)
MCV: 93.2 fL (ref 80.0–100.0)
Monocytes Absolute: 0.6 10*3/uL (ref 0.1–1.0)
Monocytes Relative: 9 %
Neutro Abs: 2.8 10*3/uL (ref 1.7–7.7)
Neutrophils Relative %: 40 %
Platelet Count: 113 10*3/uL — ABNORMAL LOW (ref 150–400)
RBC: 5.03 MIL/uL (ref 4.22–5.81)
RDW: 15.8 % — ABNORMAL HIGH (ref 11.5–15.5)
WBC Count: 7 10*3/uL (ref 4.0–10.5)
nRBC: 0 % (ref 0.0–0.2)

## 2023-04-09 LAB — CHROMOGRANIN A: Chromogranin A (ng/mL): 225.5 ng/mL — ABNORMAL HIGH (ref 0.0–101.8)

## 2023-04-10 DIAGNOSIS — G4733 Obstructive sleep apnea (adult) (pediatric): Secondary | ICD-10-CM | POA: Diagnosis not present

## 2023-04-10 DIAGNOSIS — J449 Chronic obstructive pulmonary disease, unspecified: Secondary | ICD-10-CM | POA: Diagnosis not present

## 2023-04-10 DIAGNOSIS — J479 Bronchiectasis, uncomplicated: Secondary | ICD-10-CM | POA: Diagnosis not present

## 2023-04-10 DIAGNOSIS — C9110 Chronic lymphocytic leukemia of B-cell type not having achieved remission: Secondary | ICD-10-CM | POA: Diagnosis not present

## 2023-04-10 DIAGNOSIS — F17218 Nicotine dependence, cigarettes, with other nicotine-induced disorders: Secondary | ICD-10-CM | POA: Diagnosis not present

## 2023-04-10 LAB — COMP PANEL: LEUKEMIA/LYMPHOMA: Immunophenotypic Profile: 35

## 2023-05-19 DIAGNOSIS — R2689 Other abnormalities of gait and mobility: Secondary | ICD-10-CM | POA: Diagnosis not present

## 2023-05-19 DIAGNOSIS — R29898 Other symptoms and signs involving the musculoskeletal system: Secondary | ICD-10-CM | POA: Diagnosis not present

## 2023-05-19 DIAGNOSIS — R519 Headache, unspecified: Secondary | ICD-10-CM | POA: Diagnosis not present

## 2023-05-19 DIAGNOSIS — G608 Other hereditary and idiopathic neuropathies: Secondary | ICD-10-CM | POA: Diagnosis not present

## 2023-05-19 DIAGNOSIS — L0100 Impetigo, unspecified: Secondary | ICD-10-CM | POA: Diagnosis not present

## 2023-05-19 DIAGNOSIS — M542 Cervicalgia: Secondary | ICD-10-CM | POA: Diagnosis not present

## 2023-05-19 DIAGNOSIS — M5481 Occipital neuralgia: Secondary | ICD-10-CM | POA: Diagnosis not present

## 2023-05-26 DIAGNOSIS — H5213 Myopia, bilateral: Secondary | ICD-10-CM | POA: Diagnosis not present

## 2023-05-27 ENCOUNTER — Encounter (INDEPENDENT_AMBULATORY_CARE_PROVIDER_SITE_OTHER): Payer: Self-pay

## 2023-06-04 DIAGNOSIS — M5481 Occipital neuralgia: Secondary | ICD-10-CM | POA: Diagnosis not present

## 2023-06-04 DIAGNOSIS — M7918 Myalgia, other site: Secondary | ICD-10-CM | POA: Diagnosis not present

## 2023-06-19 DIAGNOSIS — M159 Polyosteoarthritis, unspecified: Secondary | ICD-10-CM | POA: Diagnosis not present

## 2023-08-08 DIAGNOSIS — J449 Chronic obstructive pulmonary disease, unspecified: Secondary | ICD-10-CM | POA: Diagnosis not present

## 2023-08-08 DIAGNOSIS — G4733 Obstructive sleep apnea (adult) (pediatric): Secondary | ICD-10-CM | POA: Diagnosis not present

## 2023-08-08 DIAGNOSIS — R509 Fever, unspecified: Secondary | ICD-10-CM | POA: Diagnosis not present

## 2023-08-08 DIAGNOSIS — F17210 Nicotine dependence, cigarettes, uncomplicated: Secondary | ICD-10-CM | POA: Diagnosis not present

## 2023-08-08 DIAGNOSIS — J019 Acute sinusitis, unspecified: Secondary | ICD-10-CM | POA: Diagnosis not present

## 2023-08-08 DIAGNOSIS — Z03818 Encounter for observation for suspected exposure to other biological agents ruled out: Secondary | ICD-10-CM | POA: Diagnosis not present

## 2023-08-08 DIAGNOSIS — B9689 Other specified bacterial agents as the cause of diseases classified elsewhere: Secondary | ICD-10-CM | POA: Diagnosis not present

## 2023-08-09 ENCOUNTER — Other Ambulatory Visit: Payer: Self-pay

## 2023-08-09 ENCOUNTER — Inpatient Hospital Stay
Admission: EM | Admit: 2023-08-09 | Discharge: 2023-08-13 | DRG: 871 | Disposition: A | Discharge status: Home / Self Care

## 2023-08-09 ENCOUNTER — Emergency Department

## 2023-08-09 DIAGNOSIS — J168 Pneumonia due to other specified infectious organisms: Secondary | ICD-10-CM | POA: Diagnosis not present

## 2023-08-09 DIAGNOSIS — I5023 Acute on chronic systolic (congestive) heart failure: Secondary | ICD-10-CM | POA: Diagnosis not present

## 2023-08-09 DIAGNOSIS — G629 Polyneuropathy, unspecified: Secondary | ICD-10-CM

## 2023-08-09 DIAGNOSIS — I714 Abdominal aortic aneurysm, without rupture, unspecified: Secondary | ICD-10-CM | POA: Diagnosis present

## 2023-08-09 DIAGNOSIS — N179 Acute kidney failure, unspecified: Secondary | ICD-10-CM | POA: Diagnosis present

## 2023-08-09 DIAGNOSIS — Z880 Allergy status to penicillin: Secondary | ICD-10-CM

## 2023-08-09 DIAGNOSIS — I25810 Atherosclerosis of coronary artery bypass graft(s) without angina pectoris: Secondary | ICD-10-CM | POA: Diagnosis present

## 2023-08-09 DIAGNOSIS — I4891 Unspecified atrial fibrillation: Secondary | ICD-10-CM | POA: Diagnosis present

## 2023-08-09 DIAGNOSIS — M069 Rheumatoid arthritis, unspecified: Secondary | ICD-10-CM | POA: Diagnosis present

## 2023-08-09 DIAGNOSIS — F17210 Nicotine dependence, cigarettes, uncomplicated: Secondary | ICD-10-CM | POA: Diagnosis present

## 2023-08-09 DIAGNOSIS — M1990 Unspecified osteoarthritis, unspecified site: Secondary | ICD-10-CM | POA: Diagnosis present

## 2023-08-09 DIAGNOSIS — Z888 Allergy status to other drugs, medicaments and biological substances status: Secondary | ICD-10-CM

## 2023-08-09 DIAGNOSIS — Z8619 Personal history of other infectious and parasitic diseases: Secondary | ICD-10-CM

## 2023-08-09 DIAGNOSIS — I10 Essential (primary) hypertension: Secondary | ICD-10-CM | POA: Diagnosis present

## 2023-08-09 DIAGNOSIS — E785 Hyperlipidemia, unspecified: Secondary | ICD-10-CM | POA: Diagnosis present

## 2023-08-09 DIAGNOSIS — Z86718 Personal history of other venous thrombosis and embolism: Secondary | ICD-10-CM

## 2023-08-09 DIAGNOSIS — Z856 Personal history of leukemia: Secondary | ICD-10-CM

## 2023-08-09 DIAGNOSIS — D6861 Antiphospholipid syndrome: Secondary | ICD-10-CM | POA: Diagnosis present

## 2023-08-09 DIAGNOSIS — D696 Thrombocytopenia, unspecified: Secondary | ICD-10-CM | POA: Diagnosis present

## 2023-08-09 DIAGNOSIS — J189 Pneumonia, unspecified organism: Secondary | ICD-10-CM | POA: Diagnosis present

## 2023-08-09 DIAGNOSIS — Z7951 Long term (current) use of inhaled steroids: Secondary | ICD-10-CM

## 2023-08-09 DIAGNOSIS — E1140 Type 2 diabetes mellitus with diabetic neuropathy, unspecified: Secondary | ICD-10-CM | POA: Diagnosis present

## 2023-08-09 DIAGNOSIS — Z8249 Family history of ischemic heart disease and other diseases of the circulatory system: Secondary | ICD-10-CM

## 2023-08-09 DIAGNOSIS — J9601 Acute respiratory failure with hypoxia: Secondary | ICD-10-CM | POA: Diagnosis present

## 2023-08-09 DIAGNOSIS — I252 Old myocardial infarction: Secondary | ICD-10-CM

## 2023-08-09 DIAGNOSIS — R000 Tachycardia, unspecified: Secondary | ICD-10-CM | POA: Diagnosis not present

## 2023-08-09 DIAGNOSIS — E1151 Type 2 diabetes mellitus with diabetic peripheral angiopathy without gangrene: Secondary | ICD-10-CM | POA: Diagnosis present

## 2023-08-09 DIAGNOSIS — I700 Atherosclerosis of aorta: Secondary | ICD-10-CM | POA: Diagnosis present

## 2023-08-09 DIAGNOSIS — Z85048 Personal history of other malignant neoplasm of rectum, rectosigmoid junction, and anus: Secondary | ICD-10-CM

## 2023-08-09 DIAGNOSIS — A419 Sepsis, unspecified organism: Principal | ICD-10-CM | POA: Diagnosis present

## 2023-08-09 DIAGNOSIS — I2510 Atherosclerotic heart disease of native coronary artery without angina pectoris: Secondary | ICD-10-CM | POA: Diagnosis present

## 2023-08-09 DIAGNOSIS — E860 Dehydration: Secondary | ICD-10-CM | POA: Diagnosis not present

## 2023-08-09 DIAGNOSIS — Z955 Presence of coronary angioplasty implant and graft: Secondary | ICD-10-CM

## 2023-08-09 DIAGNOSIS — K219 Gastro-esophageal reflux disease without esophagitis: Secondary | ICD-10-CM | POA: Diagnosis present

## 2023-08-09 DIAGNOSIS — Z8042 Family history of malignant neoplasm of prostate: Secondary | ICD-10-CM

## 2023-08-09 DIAGNOSIS — Z9049 Acquired absence of other specified parts of digestive tract: Secondary | ICD-10-CM

## 2023-08-09 DIAGNOSIS — Z833 Family history of diabetes mellitus: Secondary | ICD-10-CM

## 2023-08-09 DIAGNOSIS — Z1152 Encounter for screening for COVID-19: Secondary | ICD-10-CM | POA: Diagnosis not present

## 2023-08-09 DIAGNOSIS — Z7901 Long term (current) use of anticoagulants: Secondary | ICD-10-CM

## 2023-08-09 DIAGNOSIS — R0602 Shortness of breath: Secondary | ICD-10-CM | POA: Diagnosis present

## 2023-08-09 DIAGNOSIS — I429 Cardiomyopathy, unspecified: Secondary | ICD-10-CM | POA: Diagnosis present

## 2023-08-09 DIAGNOSIS — Z79899 Other long term (current) drug therapy: Secondary | ICD-10-CM | POA: Diagnosis not present

## 2023-08-09 DIAGNOSIS — I110 Hypertensive heart disease with heart failure: Secondary | ICD-10-CM | POA: Diagnosis present

## 2023-08-09 DIAGNOSIS — Z881 Allergy status to other antibiotic agents status: Secondary | ICD-10-CM

## 2023-08-09 DIAGNOSIS — N400 Enlarged prostate without lower urinary tract symptoms: Secondary | ICD-10-CM | POA: Diagnosis present

## 2023-08-09 DIAGNOSIS — Z803 Family history of malignant neoplasm of breast: Secondary | ICD-10-CM

## 2023-08-09 DIAGNOSIS — Z9079 Acquired absence of other genital organ(s): Secondary | ICD-10-CM

## 2023-08-09 DIAGNOSIS — R6520 Severe sepsis without septic shock: Secondary | ICD-10-CM | POA: Diagnosis present

## 2023-08-09 DIAGNOSIS — R509 Fever, unspecified: Secondary | ICD-10-CM | POA: Diagnosis not present

## 2023-08-09 LAB — RESP PANEL BY RT-PCR (RSV, FLU A&B, COVID)  RVPGX2
Influenza A by PCR: NEGATIVE
Influenza B by PCR: NEGATIVE
Resp Syncytial Virus by PCR: NEGATIVE
SARS Coronavirus 2 by RT PCR: NEGATIVE

## 2023-08-09 LAB — CBC
HCT: 43.6 % (ref 39.0–52.0)
Hemoglobin: 14.5 g/dL (ref 13.0–17.0)
MCH: 30.9 pg (ref 26.0–34.0)
MCHC: 33.3 g/dL (ref 30.0–36.0)
MCV: 93 fL (ref 80.0–100.0)
Platelets: 123 K/uL — ABNORMAL LOW (ref 150–400)
RBC: 4.69 MIL/uL (ref 4.22–5.81)
RDW: 16.1 % — ABNORMAL HIGH (ref 11.5–15.5)
WBC: 12.8 K/uL — ABNORMAL HIGH (ref 4.0–10.5)
nRBC: 0 % (ref 0.0–0.2)

## 2023-08-09 LAB — BASIC METABOLIC PANEL WITH GFR
Anion gap: 10 (ref 5–15)
BUN: 21 mg/dL (ref 8–23)
CO2: 21 mmol/L — ABNORMAL LOW (ref 22–32)
Calcium: 8.7 mg/dL — ABNORMAL LOW (ref 8.9–10.3)
Chloride: 106 mmol/L (ref 98–111)
Creatinine, Ser: 1.3 mg/dL — ABNORMAL HIGH (ref 0.61–1.24)
GFR, Estimated: 57 mL/min — ABNORMAL LOW (ref >60)
Glucose, Bld: 199 mg/dL — ABNORMAL HIGH (ref 70–99)
Potassium: 3.7 mmol/L (ref 3.5–5.1)
Sodium: 137 mmol/L (ref 135–145)

## 2023-08-09 LAB — HEPATIC FUNCTION PANEL
ALT: 15 U/L (ref 0–44)
AST: 28 U/L (ref 15–41)
Albumin: 3.5 g/dL (ref 3.5–5.0)
Alkaline Phosphatase: 69 U/L (ref 38–126)
Bilirubin, Direct: 0.6 mg/dL — ABNORMAL HIGH (ref 0.0–0.2)
Indirect Bilirubin: 2.5 mg/dL — ABNORMAL HIGH (ref 0.3–0.9)
Total Bilirubin: 3.1 mg/dL — ABNORMAL HIGH (ref 0.0–1.2)
Total Protein: 6.7 g/dL (ref 6.5–8.1)

## 2023-08-09 LAB — LACTIC ACID, PLASMA: Lactic Acid, Venous: 1.5 mmol/L (ref 0.5–1.9)

## 2023-08-09 LAB — BLOOD GAS, ARTERIAL
Acid-base deficit: 3.2 mmol/L — ABNORMAL HIGH (ref 0.0–2.0)
Bicarbonate: 19.7 mmol/L — ABNORMAL LOW (ref 20.0–28.0)
O2 Saturation: 97.5 %
Patient temperature: 37
pCO2 arterial: 29 mmHg — ABNORMAL LOW (ref 32–48)
pH, Arterial: 7.44 (ref 7.35–7.45)
pO2, Arterial: 71 mmHg — ABNORMAL LOW (ref 83–108)

## 2023-08-09 LAB — TROPONIN I (HIGH SENSITIVITY): Troponin I (High Sensitivity): 32 ng/L — ABNORMAL HIGH (ref <18)

## 2023-08-09 LAB — BRAIN NATRIURETIC PEPTIDE: B Natriuretic Peptide: 815.2 pg/mL — ABNORMAL HIGH (ref 0.0–100.0)

## 2023-08-09 MED ORDER — POTASSIUM CHLORIDE CRYS ER 20 MEQ PO TBCR
20.0000 meq | EXTENDED_RELEASE_TABLET | Freq: Every day | ORAL | Status: DC
  Administered 2023-08-09 – 2023-08-13 (×5): 20 meq via ORAL
  Filled 2023-08-09 (×5): qty 1

## 2023-08-09 MED ORDER — LATANOPROST 0.005 % OP SOLN
1.0000 [drp] | Freq: Every day | OPHTHALMIC | Status: DC
  Filled 2023-08-09 (×2): qty 2.5

## 2023-08-09 MED ORDER — ADULT MULTIVITAMIN W/MINERALS CH
1.0000 | ORAL_TABLET | Freq: Every day | ORAL | Status: DC
  Administered 2023-08-10 – 2023-08-13 (×4): 1 via ORAL
  Filled 2023-08-09 (×4): qty 1

## 2023-08-09 MED ORDER — VITAMIN D 25 MCG (1000 UNIT) PO TABS
2000.0000 [IU] | ORAL_TABLET | Freq: Every day | ORAL | Status: DC
  Administered 2023-08-10 – 2023-08-13 (×4): 2000 [IU] via ORAL
  Filled 2023-08-09 (×4): qty 2

## 2023-08-09 MED ORDER — ENOXAPARIN SODIUM 40 MG/0.4ML IJ SOSY: 40.0000 mg | PREFILLED_SYRINGE | INTRAMUSCULAR | Status: DC

## 2023-08-09 MED ORDER — ONDANSETRON HCL 4 MG PO TABS
4.0000 mg | ORAL_TABLET | Freq: Four times a day (QID) | ORAL | Status: DC | PRN
Start: 2023-08-09 — End: 2023-08-13

## 2023-08-09 MED ORDER — SODIUM CHLORIDE 0.9 % IV SOLN
500.0000 mg | INTRAVENOUS | Status: DC
  Administered 2023-08-10 – 2023-08-11 (×2): 500 mg via INTRAVENOUS
  Filled 2023-08-09 (×3): qty 5

## 2023-08-09 MED ORDER — FUROSEMIDE 10 MG/ML IJ SOLN
20.0000 mg | Freq: Once | INTRAMUSCULAR | Status: AC
  Administered 2023-08-09: 20 mg via INTRAVENOUS
  Filled 2023-08-09: qty 4

## 2023-08-09 MED ORDER — MORPHINE SULFATE (PF) 2 MG/ML IV SOLN
INTRAVENOUS | Status: AC
  Filled 2023-08-09: qty 1

## 2023-08-09 MED ORDER — LOSARTAN POTASSIUM 50 MG PO TABS: 25.0000 mg | ORAL_TABLET | Freq: Every day | ORAL | Status: DC

## 2023-08-09 MED ORDER — ISOSORBIDE MONONITRATE ER 30 MG PO TB24
60.0000 mg | ORAL_TABLET | Freq: Every day | ORAL | Status: DC
  Administered 2023-08-10: 60 mg via ORAL
  Filled 2023-08-09: qty 1

## 2023-08-09 MED ORDER — LACTATED RINGERS IV BOLUS
1000.0000 mL | Freq: Once | INTRAVENOUS | Status: AC
  Administered 2023-08-09: 1000 mL via INTRAVENOUS

## 2023-08-09 MED ORDER — SODIUM CHLORIDE 0.9 % IV SOLN
2.0000 g | Freq: Once | INTRAVENOUS | Status: AC
  Administered 2023-08-09: 2 g via INTRAVENOUS
  Filled 2023-08-09: qty 20

## 2023-08-09 MED ORDER — ACETAMINOPHEN 325 MG PO TABS
650.0000 mg | ORAL_TABLET | Freq: Four times a day (QID) | ORAL | Status: DC | PRN
  Administered 2023-08-09 – 2023-08-11 (×5): 650 mg via ORAL
  Filled 2023-08-09 (×5): qty 2

## 2023-08-09 MED ORDER — FLUTICASONE PROPIONATE 50 MCG/ACT NA SUSP: 2.0000 | Freq: Every day | NASAL | Status: DC | PRN

## 2023-08-09 MED ORDER — IPRATROPIUM-ALBUTEROL 0.5-2.5 (3) MG/3ML IN SOLN
3.0000 mL | Freq: Four times a day (QID) | RESPIRATORY_TRACT | Status: DC
  Administered 2023-08-09 – 2023-08-10 (×3): 3 mL via RESPIRATORY_TRACT
  Filled 2023-08-09 (×4): qty 3

## 2023-08-09 MED ORDER — PANTOPRAZOLE SODIUM 40 MG PO TBEC
40.0000 mg | DELAYED_RELEASE_TABLET | Freq: Two times a day (BID) | ORAL | Status: DC
Start: 2023-08-10 — End: 2023-08-13
  Administered 2023-08-10 – 2023-08-13 (×6): 40 mg via ORAL
  Filled 2023-08-09 (×8): qty 1

## 2023-08-09 MED ORDER — ONDANSETRON HCL 4 MG/2ML IJ SOLN: 4.0000 mg | Freq: Four times a day (QID) | INTRAMUSCULAR | Status: DC | PRN

## 2023-08-09 MED ORDER — LACTATED RINGERS IV SOLN
150.0000 mL/h | INTRAVENOUS | Status: DC
  Administered 2023-08-09: 150 mL/h via INTRAVENOUS

## 2023-08-09 MED ORDER — AMLODIPINE BESYLATE 10 MG PO TABS
10.0000 mg | ORAL_TABLET | Freq: Every day | ORAL | Status: DC
  Administered 2023-08-10: 10 mg via ORAL
  Filled 2023-08-09: qty 2

## 2023-08-09 MED ORDER — GABAPENTIN 300 MG PO CAPS
300.0000 mg | ORAL_CAPSULE | Freq: Two times a day (BID) | ORAL | Status: DC
  Administered 2023-08-09 – 2023-08-13 (×8): 300 mg via ORAL
  Filled 2023-08-09 (×8): qty 1

## 2023-08-09 MED ORDER — MAGNESIUM HYDROXIDE 400 MG/5ML PO SUSP
30.0000 mL | Freq: Every day | ORAL | Status: DC | PRN
Start: 2023-08-09 — End: 2023-08-13

## 2023-08-09 MED ORDER — GUAIFENESIN ER 600 MG PO TB12
600.0000 mg | ORAL_TABLET | Freq: Two times a day (BID) | ORAL | Status: DC
  Administered 2023-08-09 – 2023-08-13 (×8): 600 mg via ORAL
  Filled 2023-08-09 (×8): qty 1

## 2023-08-09 MED ORDER — HYDROCOD POLI-CHLORPHE POLI ER 10-8 MG/5ML PO SUER
5.0000 mL | Freq: Two times a day (BID) | ORAL | Status: DC | PRN
  Administered 2023-08-09 – 2023-08-11 (×2): 5 mL via ORAL
  Filled 2023-08-09 (×2): qty 5

## 2023-08-09 MED ORDER — MULTIVITAMIN MEN 50+ PO TABS: ORAL_TABLET | Freq: Every day | ORAL | Status: DC

## 2023-08-09 MED ORDER — ATORVASTATIN CALCIUM 80 MG PO TABS
80.0000 mg | ORAL_TABLET | Freq: Every day | ORAL | Status: DC
  Administered 2023-08-10 – 2023-08-13 (×4): 80 mg via ORAL
  Filled 2023-08-09: qty 1
  Filled 2023-08-09: qty 4
  Filled 2023-08-09 (×2): qty 1

## 2023-08-09 MED ORDER — SODIUM CHLORIDE 0.9 % IV SOLN
2.0000 g | INTRAVENOUS | Status: DC
  Administered 2023-08-10 – 2023-08-12 (×3): 2 g via INTRAVENOUS
  Filled 2023-08-09 (×4): qty 20

## 2023-08-09 MED ORDER — METHOCARBAMOL 500 MG PO TABS: 500.0000 mg | ORAL_TABLET | Freq: Four times a day (QID) | ORAL | Status: DC | PRN

## 2023-08-09 MED ORDER — RIVAROXABAN 20 MG PO TABS
20.0000 mg | ORAL_TABLET | Freq: Every day | ORAL | Status: DC
  Administered 2023-08-10 – 2023-08-13 (×4): 20 mg via ORAL
  Filled 2023-08-09 (×5): qty 1

## 2023-08-09 MED ORDER — ACETAMINOPHEN 650 MG RE SUPP: 650.0000 mg | Freq: Four times a day (QID) | RECTAL | Status: DC | PRN

## 2023-08-09 MED ORDER — METOPROLOL SUCCINATE ER 50 MG PO TB24
100.0000 mg | ORAL_TABLET | Freq: Every day | ORAL | Status: DC
  Administered 2023-08-10: 100 mg via ORAL
  Filled 2023-08-09: qty 2

## 2023-08-09 MED ORDER — TRAZODONE HCL 50 MG PO TABS: 25.0000 mg | ORAL_TABLET | Freq: Every evening | ORAL | Status: DC | PRN

## 2023-08-09 MED ORDER — MORPHINE SULFATE (PF) 2 MG/ML IV SOLN
1.0000 mg | Freq: Once | INTRAVENOUS | Status: AC
  Administered 2023-08-09: 1 mg via INTRAVENOUS

## 2023-08-09 MED ORDER — SODIUM CHLORIDE 0.9 % IV SOLN
500.0000 mg | Freq: Once | INTRAVENOUS | Status: AC
  Administered 2023-08-09: 500 mg via INTRAVENOUS
  Filled 2023-08-09: qty 5

## 2023-08-09 NOTE — ED Provider Notes (Signed)
 [facility] Provider Note    Event Date/Time   First MD Initiated Contact with Patient 08/09/23 1746     (approximate)   History   Chief Complaint Fever and Shortness of Breath   HPI  Martin Adkins is a 78 y.o. male with past medical history of diabetes, CAD, CHF, atrial fibrillation on Xarelto , rheumatoid arthritis, rectal cancer, and CLL who presents to the ED complaining of fever.  Patient reports that over the past 3 days he has been dealing with fevers as high as 104, did take a dose of Tylenol  just prior to arrival.  This been associated with a cough where he feels like there is sputum there that he is unable to clear.  He also appears feeling mildly short of breath, but denies any pain in his chest.  He has been feeling generally unwell with bodyaches, diagnosed with a sinus infection at urgent care yesterday and started on doxycycline.  He has not had any abdominal pain, nausea, vomiting, diarrhea, or dysuria.     Physical Exam   Triage Vital Signs: ED Triage Vitals  Encounter Vitals Group     BP 08/09/23 1730 106/70     Girls Systolic BP Percentile --      Girls Diastolic BP Percentile --      Boys Systolic BP Percentile --      Boys Diastolic BP Percentile --      Pulse Rate 08/09/23 1730 (!) 109     Resp 08/09/23 1730 20     Temp 08/09/23 1730 99.2 F (37.3 C)     Temp Source 08/09/23 1730 Oral     SpO2 08/09/23 1730 96 %     Weight 08/09/23 1733 207 lb (93.9 kg)     Height 08/09/23 1733 6' (1.829 m)     Head Circumference --      Peak Flow --      Pain Score 08/09/23 1731 8     Pain Loc --      Pain Education --      Exclude from Growth Chart --     Most recent vital signs: Vitals:   08/09/23 1730  BP: 106/70  Pulse: (!) 109  Resp: 20  Temp: 99.2 F (37.3 C)  SpO2: 96%    Constitutional: Alert and oriented. Eyes: Conjunctivae are normal. Head: Atraumatic. Nose: No congestion/rhinnorhea. Mouth/Throat: Mucous  membranes are moist.  Cardiovascular: Tachycardic, regular rhythm. Grossly normal heart sounds.  2+ radial pulses bilaterally. Respiratory: Normal respiratory effort.  No retractions. Lungs with crackles bilaterally. Gastrointestinal: Soft and nontender. No distention. Musculoskeletal: No lower extremity tenderness nor edema.  Neurologic:  Normal speech and language. No gross focal neurologic deficits are appreciated.    ED Results / Procedures / Treatments   Labs (all labs ordered are listed, but only abnormal results are displayed) Labs Reviewed  BASIC METABOLIC PANEL WITH GFR - Abnormal; Notable for the following components:      Result Value   CO2 21 (*)    Glucose, Bld 199 (*)    Creatinine, Ser 1.30 (*)    Calcium  8.7 (*)    GFR, Estimated 57 (*)    All other components within normal limits  CBC - Abnormal; Notable for the following components:   WBC 12.8 (*)    RDW 16.1 (*)    Platelets 123 (*)    All other components within normal limits  HEPATIC FUNCTION PANEL - Abnormal; Notable for the following components:  Total Bilirubin 3.1 (*)    Bilirubin, Direct 0.6 (*)    Indirect Bilirubin 2.5 (*)    All other components within normal limits  TROPONIN I (HIGH SENSITIVITY) - Abnormal; Notable for the following components:   Troponin I (High Sensitivity) 32 (*)    All other components within normal limits  RESP PANEL BY RT-PCR (RSV, FLU A&B, COVID)  RVPGX2  CULTURE, BLOOD (ROUTINE X 2)  CULTURE, BLOOD (ROUTINE X 2)  BRAIN NATRIURETIC PEPTIDE  LACTIC ACID, PLASMA  LACTIC ACID, PLASMA     EKG  ED ECG REPORT I, Carlin Palin, the attending physician, personally viewed and interpreted this ECG.   Date: 08/09/2023  EKG Time: 17:37  Rate: 109  Rhythm: sinus tachycardia, frequent PVCs  Axis: Normal  Intervals:none  ST&T Change: Inferior Q waves, similar to previous  RADIOLOGY Chest x-ray reviewed and interpreted by me with right upper lobe  pneumonia.  PROCEDURES:  Critical Care performed: Yes, see critical care procedure note(s)  .Critical Care  Performed by: Palin Carlin, MD Authorized by: Palin Carlin, MD   Critical care provider statement:    Critical care time (minutes):  30   Critical care time was exclusive of:  Separately billable procedures and treating other patients and teaching time   Critical care was necessary to treat or prevent imminent or life-threatening deterioration of the following conditions:  Sepsis   Critical care was time spent personally by me on the following activities:  Development of treatment plan with patient or surrogate, discussions with consultants, evaluation of patient's response to treatment, examination of patient, ordering and review of laboratory studies, ordering and review of radiographic studies, ordering and performing treatments and interventions, pulse oximetry, re-evaluation of patient's condition and review of old charts   I assumed direction of critical care for this patient from another provider in my specialty: no     Care discussed with: admitting provider      MEDICATIONS ORDERED IN ED: Medications  cefTRIAXone  (ROCEPHIN ) 2 g in sodium chloride  0.9 % 100 mL IVPB (has no administration in time range)  azithromycin  (ZITHROMAX ) 500 mg in sodium chloride  0.9 % 250 mL IVPB (has no administration in time range)  lactated ringers  bolus 1,000 mL (has no administration in time range)     IMPRESSION / MDM / ASSESSMENT AND PLAN / ED COURSE  I reviewed the triage vital signs and the nursing notes.                              78 y.o. male with past medical history of diabetes, CAD, CHF, atrial fibrillation on Xarelto , rectal cancer, CLL, RA, and AAA who presents to the ED complaining of 3 days of fever, cough, and some mild difficulty breathing.  Patient's presentation is most consistent with acute presentation with potential threat to life or bodily  function.  Differential diagnosis includes, but is not limited to, sepsis, pneumonia, UTI, viral syndrome, anemia, electrolyte abnormality, AKI, ACS, PE.  Patient ill-appearing but in no acute distress, vital signs remarkable for tachycardia but otherwise reassuring.  He is not in any respiratory distress and maintaining oxygen saturations at 96% on room air.  Chest x-ray is concerning for pneumonia and patient meets sepsis criteria with leukocytosis and tachycardia, as well as fevers prior to arrival.  We will start him on IV Rocephin  and azithromycin , has tolerated cephalosporins in the past.  Additional labs with mild AKI, no significant anemia  noted.  Patient does have elevation in indirect bilirubin as well as mild elevation in his troponin, suspect this is due to sepsis.  Low suspicion for ACS or PE at this time, will hydrate with IV fluids and check blood cultures as well as lactic acid.  Case discussed with hospitalist for admission.      FINAL CLINICAL IMPRESSION(S) / ED DIAGNOSES   Final diagnoses:  Sepsis without acute organ dysfunction, due to unspecified organism [facility])  Pneumonia of right upper lobe due to infectious organism     Rx / DC Orders   ED Discharge Orders     None        Note:  This document was prepared using Dragon voice recognition software and may include unintentional dictation errors.   Willo Dunnings, MD 08/09/23 [phone]

## 2023-08-09 NOTE — ED Notes (Signed)
 Pt work of breathing has improved. Currently on Bipap. Dr. Lawence came bedside to evaulate patient. Pt daughter bedside. All questions answered for patient and family. Pt states he is breathing better. Will continue to monitor. Encouraged patient to use urinal due to increased work of breathing after ambulating to the bathroom earlier tonight. Pt agreeable to use urinal.

## 2023-08-09 NOTE — ED Triage Notes (Signed)
 to ED for SOB, body aches, fever since 2-3 days ago. Temp was 104 today, took tylenol . Seen at [facility] yesterday for same, prescribed PO abx and had negative covid test.  Temp is 99.4 in triage. States temp goes down after tylenol  then goes back up. Alert and oriented. Blue top and lactic sent with labs.

## 2023-08-09 NOTE — ED Notes (Signed)
 RT bedside.

## 2023-08-09 NOTE — ED Notes (Signed)
 CCMD called and verified patient on telemetry

## 2023-08-09 NOTE — Assessment & Plan Note (Signed)
-   The patient will be admitted to a medical telemetry bed. - Will continue antibiotic therapy with IV Rocephin  and Zithromax . - Mucolytic therapy be provided as well as duo nebs q.i.d. and q.4 hours p.r.n. - We will follow blood cultures. -If his sepsis is manifested by tachycardia 109, tachypnea of 30 and leukocytosis of 12.8. - Will be hydrated with IV lactated ringer .

## 2023-08-09 NOTE — Assessment & Plan Note (Signed)
-   This is likely prerenal due to volume depletion and mild dehydration. - He will be hydrated with IV normal saline. - Will hold off nephrotoxins. - We will follow BMP.

## 2023-08-09 NOTE — Assessment & Plan Note (Addendum)
-   Will continue Imdur , beta-blocker therapy and statin therapy.

## 2023-08-09 NOTE — Assessment & Plan Note (Signed)
 Continue Xarelto

## 2023-08-09 NOTE — Progress Notes (Signed)
 Pt being followed by [facility] for Sepsis protocol.

## 2023-08-09 NOTE — Progress Notes (Signed)
 Patient has been transferred from room 8 to 32. Able to wean patient off bipap to 3 liter nasal cannula. Patient was able to ambulate from stretcher to bed without distress. Informed Dr Lawence that patient has been weaned from bipap to nasal cannula and will change order to cpap at bedtime. Patient states he does wear a cpap at home from time to time but is not interested in wearing one while here. Order active in the event he changes his mind.

## 2023-08-09 NOTE — Assessment & Plan Note (Signed)
Will continue Neurontin.

## 2023-08-09 NOTE — Progress Notes (Signed)
 Critical care note:  Date of note: 08/09/2023  Subjective: The patient finished his nebulizer therapy and stated that he needed to use the bathroom.  He ambulated bear and later developed dyspnea and started coughing and was diaphoretic.  He became tachycardic.  Upon my reevaluation he was working hard to breathe.  He denied any chest pain.  No nausea or vomiting.  He was having fever and chills and was given Tylenol  earlier.  He desatted to 88% on 5 L of O2 by nasal cannula.  He was then placed on high flow nasal cannula at 8 L/min and pulse oximetry was 92%.  He was placed on  Objective: Physical examination: Generally: Acutely ill elderly African-American male in a moderate respiratory distress with conversational dyspnea Vital signs: Pulse oximetry as above.  BP was 145/119 with heart rate of 141 respiratory rate of 17 and pressure was 102 earlier. Head - atraumatic, normocephalic.  Pupils - equal, round and reactive to light and accommodation. Extraocular movements are intact. No scleral icterus.  Oropharynx - moist mucous membranes and tongue. No pharyngeal erythema or exudate.  Neck - supple. No JVD. Carotid pulses 2+ bilaterally. No carotid bruits. No palpable thyromegaly or lymphadenopathy. Cardiovascular - regular rate and rhythm. Normal S1 and S2. No murmurs, gallops or rubs.  Lungs -diminished bibasilar breath sounds with bibasal rales Abdomen - soft and nontender. Positive bowel sounds. No palpable organomegaly or masses.  Extremities - no pitting edema, clubbing or cyanosis.  Neuro - grossly non-focal. Skin - no rashes. GU and rectal exam - deferred.   Stat ABG showed pH 7.44, PCO221 PO271 and HCO3 19.7 with O2 sat of 97.5%.   Assessment/plan: 1.  Acute respiratory failure with hypoxia in the setting of right upper lobe pneumonia with associated sepsis. This is highly suspicious of fluid overload and acute on chronic systolic CHF especially given elevated BNP. - The patient was  given 20 mg of IV Lasix  and 1 mg of IV morphine  sulfate. - His IV lactated Ringer  infusion was held off. - He was ordered BiPAP. - We will continue his IV Rocephin  and Zithromax .  Authorized and performed by: Madison Peaches, MD Total critical care time:   30     minutes. Due to a high probability of clinically significant, life-threatening deterioration, the patient required my highest level of preparedness to intervene emergently and I personally spent this critical care time directly and personally managing the patient.  This critical care time included obtaining a history, examining the patient, pulse oximetry, ordering and review of studies, arranging urgent treatment with development of management plan, evaluation of patient's response to treatment, frequent reassessment, and discussions with other providers. This critical care time was performed to assess and manage the high probability of imminent, life-threatening deterioration that could result in multiorgan failure.  It was exclusive of separately billable procedures and treating other patients and teaching time.

## 2023-08-09 NOTE — Consult Note (Signed)
 CODE SEPSIS - PHARMACY COMMUNICATION  **Broad-spectrum antimicrobials should be administered within one hour of sepsis diagnosis**  Time Code Sepsis call or page was received: 1834  Antibiotics ordered: Ceftriaxone , Azithromycin   Time of first antibiotic administration: 1907  Additional action taken by pharmacy: N/A  If necessary, name of provider/nurse contacted: N/A    Will M. Lenon, PharmD Clinical Pharmacist 08/09/2023 6:37 PM

## 2023-08-09 NOTE — ED Notes (Addendum)
 Pt with chills. Pt finished a breathing treatment and stated he needed to use the bathroom. Pt ambulated to the bathroom. Pt developed SOB. Pt started coughing, sweating. HR elevated. RT notified to come bedside. Dr. Lawence notified to evaluate patient. Verbal orders received.

## 2023-08-09 NOTE — Assessment & Plan Note (Signed)
Will continue antihypertensive therapy.

## 2023-08-09 NOTE — Assessment & Plan Note (Signed)
 Will continue PPI therapy.

## 2023-08-09 NOTE — H&P (Signed)
 Martin Adkins   PATIENT NAME: Martin Adkins    MR#:  [id number]  DATE OF BIRTH:  1945-04-26  DATE OF ADMISSION:  08/09/2023  PRIMARY CARE PHYSICIAN: Rudolpho Norleen BIRCH, MD   Patient is coming from: Home  REQUESTING/REFERRING PHYSICIAN: Willo Dunnings, MD  CHIEF COMPLAINT:   Chief Complaint  Patient presents with   Fever   Shortness of Breath    HISTORY OF PRESENT ILLNESS:  Martin Adkins is a 78 y.o. male with medical history significant for atrial fibrillation, BPH, coronary artery disease, cardiomyopathy, CLL, DVT on Xarelto , systolic CHF, dyslipidemia, coronary artery disease status post four-vessel CABG and PCI with stent placement as well as peripheral vascular disease, to emergency room with acute onset of high fever with a Tmax of 104.2 today.  It has been as high as 103 yesterday.  He has been having cough with inability to expectorate as well as dyspnea without wheezing.  He admits to mild chest tightness with deep breathing.  No nausea or vomiting or abdominal pain.  No dysuria, oliguria or hematuria or flank pain.  He continues to smoke 5 to 6 cigarettes/day.  ED Course: When he came to the ER, heart rate was 109 and temperature 99.2 with otherwise normal vital signs.  Labs revealed creatinine 1.3 and calcium  of 8.7 with total bili of 3.1.  BNP was 815.2 and high sensitive troponin I was 32.  Lactic acid was 1.5 and CBC showed leukocytosis of 12.8 and thrombocytopenia 123 above previous levels.  Respiratory panel came back negative.  Blood cultures were drawn. EKG as reviewed by me : EKG showed sinus tachycardia with rate 109 with frequent PVCs and inferior Q waves. Imaging: 2 view chest x-ray showed anterior right upper lobe pneumonia.  Left lung was clear and no effusion.  The patient was given IV Rocephin  and Zithromax .  He will be admitted to a medical telemetry bed for further evaluation and management. PAST MEDICAL HISTORY:   Past Medical History:   Diagnosis Date   AAA (abdominal aortic aneurysm) without rupture (HCC)    a.) US  02/09/04 - 3.1 x 3.4 cm. b.) US  02/14/05 - 3.62 x 3.63 cm. c.) US  02/25/06 - 3.97 x 4.0 cm. d.) CT 07/09/11 - 4.9 cm. e.) s/p EVAR 2013. f.) enlarging AAA --> back to the OR on 09/24/2017 for placement of a 27 mm x 12 cm RIGHT iliac extension limb down to distal CIA.   Aortic atherosclerosis (HCC)    APS (antiphospholipid syndrome) (HCC)    Arthritis    Atrial fibrillation (HCC)    a.) CHA2DS2-VASc Score = 6 (age, CHF, HTN, prior DVT x 2, prior MI). b.) on rivaroxaban    B12 deficiency 03/21/2017   Barrett's esophagus    BPH (benign prostatic hyperplasia)    CAD (coronary artery disease)    a.) LHC 11/14/95 --> 95% mLCx, 95% OM1; PTCA. b.) 4v CABG 2004 --> LIMA-LAD, SVG-D1,RCA,OM. c.) NSTEMI 05/02/2013 --> PCI with DES x 3 to SVG-OM in 2 distinct areas with filter. d.) Inf STEMI 07/30/14 - LHC --> 50% LM, 90% mLCx, 100% OM2, 100% RPDA; LIMA-LAD patent; 100% occ of SVG OM2,OM3,PDA; med mgmt. e.) LHC 01/19/18 - patent LIMA-LAD; other grafts occ; not amenable to PCI/further bypass.   Cardiomyopathy (HCC)    Chronic anticoagulation    a.) Rivaroxaban    CLL (chronic lymphocytic leukemia) (HCC) 05/24/2017   DVT (deep venous thrombosis) (HCC)    GERD (gastroesophageal reflux disease)    HFrEF (  heart failure with reduced ejection fraction) (HCC)    a.) TTE 02/22/2020 --> mod. LV systolic dysfunction; LVEF 35-40%.   History of hiatal hernia    History of shingles 2013   Hyperlipemia    Hyperplastic polyps of stomach    a.) Bx on 07/24/2020 --> well differentiated NET; WHO grade I.   Hypertension    NSTEMI (non-ST elevated myocardial infarction) (HCC) 05/02/2013   a.) LHC 05/03/2013 --> 50% LM, 100% LAD, 75% mLCx, 100% dLCx, 60% mRCA; patient LIMA-LAD, occluded SVG-D1 and SVG-PDA. SVG-OM with extensive thrombus and distal 99% lesion. HIGH RISK PCI performed at [facility] with placement of DES x 3 to 2 distinct areas of SVG  with filter protection.   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  b.) Stage I (cT1, cN0, cM0)   PVD (peripheral vascular disease) (HCC)    RA (rheumatoid arthritis) (HCC)    S/P CABG x 4 01/21/2002   a.) 4v; LIMA-LAD, SVG-D1, SVG-RCA, SVG-OM   Sepsis due to pneumonia (HCC) 08/09/2023   SOB (shortness of breath)    ST elevation myocardial infarction (STEMI) of inferior wall (HCC) 07/30/2014   a.) LHC --> 50% LM, 90% mLCx, 100% OM2, 100% RPDA; LIMA-LAD patent; 100% occlusions of SVG OM2, OM3, PDA. No intervention; medical management.   Valvular regurgitation    a.) TTE 02/22/2020 --> LVEF 35-40%; mild panvalvular.    PAST SURGICAL HISTORY:   Past Surgical History:  Procedure Laterality Date   ABDOMINAL AORTA STENT     BIOPSY  11/05/2021   Procedure: BIOPSY;  Surgeon: Wilhelmenia Aloha Raddle., MD;  Location: [facility];  Service: Gastroenterology;;   CHOLECYSTECTOMY     COLONOSCOPY  11/24/2009   COLONOSCOPY N/A 10/09/2020   Procedure: COLONOSCOPY;  Surgeon: Wilhelmenia Aloha Raddle., MD;  Location: [facility];  Service: Gastroenterology;  Laterality: N/A;   COLONOSCOPY WITH PROPOFOL  N/A 07/24/2020   Procedure: COLONOSCOPY WITH PROPOFOL ;  Surgeon: Toledo, Ladell POUR, MD;  Location: [facility];  Service: Gastroenterology;  Laterality: N/A;   CORONARY ANGIOPLASTY WITH STENT PLACEMENT Left 05/03/2013   Procedure: CORONARY ANGIOPLASTY WITH STENT PLACEMENT (DES x 3 to SVG-OM graft); Location: [facility]   CORONARY ARTERY BYPASS GRAFT N/A 01/21/2002   Procedure: 4V CABG (LIMA-LAD, SVG-D1, SVG-RCA, SVG-OM); Location: [facility]   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   Procedure: DUPUYTREN CONTRACTURE RELEASE LEFT LONG FINGER;  Surgeon: Edie Norleen PARAS, MD;  Location: [facility];  Service: Orthopedics;  Laterality: Left;   DUPUYTREN CONTRACTURE RELEASE Right  10/18/2020   Procedure: DUPUYTREN CONTRACTURE RELEASE;  Surgeon: Edie Norleen PARAS, MD;  Location: [facility];  Service: Orthopedics;  Laterality: Right;   ENDOVASCULAR REPAIR/STENT GRAFT N/A 09/24/2017   Procedure: ENDOVASCULAR REPAIR/STENT GRAFT (27 mm x 12 cm RIGHT iliac limb extension to distal CIA);  Surgeon: Marea Selinda RAMAN, MD;  Location: [facility];  Service: Cardiovascular;  Laterality: N/A;   ESOPHAGOGASTRODUODENOSCOPY  05/26/02  03/23/12   ESOPHAGOGASTRODUODENOSCOPY (EGD) WITH PROPOFOL  N/A 10/28/2016   Procedure: ESOPHAGOGASTRODUODENOSCOPY (EGD) WITH PROPOFOL ;  Surgeon: Viktoria Lamar DASEN, MD;  Location: [facility];  Service: Endoscopy;  Laterality: N/A;   ESOPHAGOGASTRODUODENOSCOPY (EGD) WITH PROPOFOL  N/A 11/05/2021   Procedure: ESOPHAGOGASTRODUODENOSCOPY (EGD) WITH PROPOFOL ;  Surgeon: Wilhelmenia Aloha Raddle., MD;  Location: [facility];  Service: Gastroenterology;  Laterality: N/A;   EUS N/A 10/09/2020   Procedure: LOWER ENDOSCOPIC ULTRASOUND (  EUS);  Surgeon: Mansouraty, Aloha Raddle., MD;  Location: [facility];  Service: Gastroenterology;  Laterality: N/A;   EUS N/A 11/05/2021   Procedure: LOWER ENDOSCOPIC ULTRASOUND (EUS);  Surgeon: Wilhelmenia Aloha Raddle., MD;  Location: [facility];  Service: Gastroenterology;  Laterality: N/A;   EUS N/A 11/11/2022   Procedure: LOWER ENDOSCOPIC ULTRASOUND (EUS);  Surgeon: Wilhelmenia Aloha Raddle., MD;  Location: [facility];  Service: Gastroenterology;  Laterality: N/A;   FLEXIBLE SIGMOIDOSCOPY N/A 11/05/2021   Procedure: FLEXIBLE SIGMOIDOSCOPY;  Surgeon: Wilhelmenia Aloha Raddle., MD;  Location: [facility];  Service: Gastroenterology;  Laterality: N/A;   FLEXIBLE SIGMOIDOSCOPY N/A 11/11/2022   Procedure: FLEXIBLE SIGMOIDOSCOPY;  Surgeon: Wilhelmenia Aloha Raddle., MD;  Location: [facility];  Service: Gastroenterology;  Laterality: N/A;   HEMOSTASIS CLIP PLACEMENT  11/05/2021   Procedure: HEMOSTASIS CLIP PLACEMENT;  Surgeon: Wilhelmenia Aloha Raddle., MD;  Location: [facility];  Service: Gastroenterology;;   INCISION AND DRAINAGE ABSCESS N/A 06/14/2020   Procedure: INCISION AND DRAINAGE ABSCESS;  Surgeon: Rodolph Romano, MD;  Location: [facility];  Service: General;  Laterality: N/A;   INGUINAL HERNIA REPAIR Left    LEFT HEART CATH AND CORONARY ANGIOGRAPHY N/A 01/19/2018   Procedure: LEFT HEART CATH AND CORONARY ANGIOGRAPHY;  Surgeon: Bosie Vinie LABOR, MD;  Location: [facility];  Service: Cardiovascular;  Laterality: N/A;   LEFT HEART CATH AND CORONARY ANGIOGRAPHY Left 11/14/1995   Procedure: CARDIAC CATHETERIZATION (PTCA); Location: [facility]; Surgeon: Miquel Ellen, MD   LEFT HEART CATH AND CORS/GRAFTS ANGIOGRAPHY Left 07/30/2014   Procedure: CARDIAC CATHETERIZATION; Location: [facility]   PERIPHERAL VASCULAR CATHETERIZATION N/A 09/06/2014   Procedure: IVC Filter Removal;  Surgeon: Cordella KANDICE Shawl, MD;  Location: [facility];  Service: Cardiovascular;  Laterality: N/A;   POLYPECTOMY  10/09/2020   Procedure: POLYPECTOMY;  Surgeon: Wilhelmenia Aloha Raddle., MD;  Location: [facility];  Service: Gastroenterology;;   POLYPECTOMY  11/05/2021   Procedure: POLYPECTOMY;  Surgeon: Wilhelmenia Aloha Raddle., MD;  Location: [facility];  Service: Gastroenterology;;   POLYPECTOMY  11/11/2022   Procedure: POLYPECTOMY;  Surgeon: Wilhelmenia Aloha Raddle., MD;  Location: [facility];  Service: Gastroenterology;;   SUBMUCOSAL LIFTING INJECTION  10/09/2020   Procedure: SUBMUCOSAL LIFTING INJECTION;  Surgeon: Wilhelmenia Aloha Raddle., MD;  Location: [facility];  Service: Gastroenterology;;   SUBMUCOSAL TATTOO INJECTION  11/05/2021   Procedure: SUBMUCOSAL TATTOO INJECTION;  Surgeon: Wilhelmenia Aloha Raddle., MD;  Location: [facility];  Service: Gastroenterology;;   TRANSURETHRAL RESECTION OF PROSTATE N/A 02/20/2015   Procedure: TRANSURETHRAL RESECTION OF THE PROSTATE (TURP);  Surgeon: Rosina Riis, MD;  Location: [facility];  Service:  Urology;  Laterality: N/A;    SOCIAL HISTORY:   Social History   Tobacco Use   Smoking status: Some Days    Current packs/day: 0.01    Average packs/day: (0.2 ttl pk-yrs)    Types: Cigarettes   Smokeless tobacco: Never   Tobacco comments:    sometimes 2-3 a day  Substance Use Topics   Alcohol  use: Yes    Alcohol /week: 1.0 - 2.0 standard drink of alcohol     Types: 1 - 2 Shots of liquor per week    Comment: casual    FAMILY HISTORY:   Family History  Problem Relation Age of Onset   Cancer Mother 74       BREAST   Diabetes Mother    Coronary artery disease Father    Heart attack Father    Prostate cancer Brother    Bladder Cancer Neg Hx  Kidney cancer Neg Hx     DRUG ALLERGIES:   Allergies  Allergen Reactions   Ace Inhibitors Other (See Comments)    Other reaction(s): Cough   Amoxicillin Rash    Has patient had a PCN reaction causing immediate rash, facial/tongue/throat swelling, SOB or lightheadedness with hypotension: Yes Has patient had a PCN reaction causing severe rash involving mucus membranes or skin necrosis: Unknown Has patient had a PCN reaction that required hospitalization: Unknown Has patient had a PCN reaction occurring within the last 10 years: No If all of the above answers are NO, then may proceed with Cephalosporin use.   Niacin Rash   Penicillins Rash    Has patient had a PCN reaction causing immediate rash, facial/tongue/throat swelling, SOB or lightheadedness with hypotension: Yes Has patient had a PCN reaction causing severe rash involving mucus membranes or skin necrosis: Unknown Has patient had a PCN reaction that required hospitalization: Unknown Has patient had a PCN reaction occurring within the last 10 years: No If all of the above answers are NO, then may proceed with Cephalosporin use.   Pravastatin Other (See Comments)    Other reaction(s): Muscle Pain   Rosuvastatin Other (See Comments)    Other reaction(s): Other (See  Comments) GI bleed   Simvastatin Other (See Comments)    Other reaction(s): Muscle Pain    REVIEW OF SYSTEMS:   ROS As per history of present illness. All pertinent systems were reviewed above. Constitutional, HEENT, cardiovascular, respiratory, GI, GU, musculoskeletal, neuro, psychiatric, endocrine, integumentary and hematologic systems were reviewed and are otherwise negative/unremarkable except for positive findings mentioned above in the HPI.   MEDICATIONS AT HOME:   Prior to Admission medications   Medication Sig Start Date End Date Taking? Authorizing Provider  albuterol  (VENTOLIN  HFA) 108 (90 Base) MCG/ACT inhaler SMARTSIG:2 inhalation Via Inhaler Every 6 Hours PRN Patient not taking: Reported on 03/20/2023 08/08/21   [provider]  amLODipine  (NORVASC ) 10 MG tablet Take 10 mg by mouth daily.  11/19/14   [provider]  atorvastatin  (LIPITOR ) 80 MG tablet Take 80 mg by mouth daily.     [provider]  Cholecalciferol  (VITAMIN D3) 50 MCG (2000 UT) CAPS Take 2,000 Units/day by mouth daily.    [provider]  fluticasone  (FLONASE ) 50 MCG/ACT nasal spray Place 2 sprays into both nostrils daily as needed for allergies. 05/24/14   [provider]  gabapentin  (NEURONTIN ) 300 MG capsule Take 1 capsule (300 mg total) by mouth 3 (three) times daily. Patient taking differently: Take 300 mg by mouth 2 (two) times daily. 06/03/17   Babara Call, MD  isosorbide  mononitrate (IMDUR ) 60 MG 24 hr tablet Take 1 tablet (60 mg total) by mouth daily. 01/20/18   Yisroel Sleight, MD  latanoprost  (XALATAN ) 0.005 % ophthalmic solution Place 1 drop into both eyes at bedtime.  12/29/17   [provider]  losartan  (COZAAR ) 25 MG tablet Take 25 mg by mouth daily.    [provider]  methocarbamol  (ROBAXIN ) 500 MG tablet Take by mouth. 02/08/22   [provider]  metoprolol  succinate (TOPROL -XL) 100 MG 24 hr tablet Take 100 mg by mouth daily.   05/17/14   [provider]  Multiple Vitamins-Minerals (MULTIVITAMIN MEN 50+ PO) Take 1 tablet by mouth daily.    [provider]  pantoprazole  (PROTONIX ) 40 MG tablet Take 1 tablet (40 mg total) by mouth 2 (two) times daily before a meal. 11/05/21   Mansouraty, Aloha Raddle., MD  potassium chloride  SA (K-DUR,KLOR-CON ) 20 MEQ tablet Take 20 mEq by mouth daily.  07/25/14   [provider]  rivaroxaban  (XARELTO ) 20 MG TABS tablet Take 1 tablet (20 mg total) by mouth daily with supper. 11/13/22   Mansouraty, Aloha Raddle., MD  TRELEGY ELLIPTA 100-62.5-25 MCG/ACT AEPB Inhale into the lungs. 03/06/22   [provider]      VITAL SIGNS:  Blood pressure 106/70, pulse (!) 109, temperature 99.2 F (37.3 C), temperature source Oral, resp. rate 20, height 6' (1.829 m), weight 93.9 kg, SpO2 96%.  PHYSICAL EXAMINATION:  Physical Exam  GENERAL:  78 y.o.-year-old African-American male patient lying in the bed with no acute distress.  EYES: Pupils equal, round, reactive to light and accommodation. No scleral icterus. Extraocular muscles intact.  HEENT: Head atraumatic, normocephalic. Oropharynx and nasopharynx clear.  NECK:  Supple, no jugular venous distention. No thyroid  enlargement, no tenderness.  LUNGS: Diminished right upper lung zone breath sounds with associated crackles. No use of accessory muscles of respiration.  CARDIOVASCULAR: Regular rate and rhythm, S1, S2 normal. No murmurs, rubs, or gallops.  ABDOMEN: Soft, nondistended, nontender. Bowel sounds present. No organomegaly or mass.  EXTREMITIES: No pedal edema, cyanosis, or clubbing.  NEUROLOGIC: Cranial nerves II through XII are intact. Muscle strength 5/5 in all extremities. Sensation intact. Gait not checked.  PSYCHIATRIC: The patient is alert and oriented x 3.  Normal affect and good eye contact. SKIN: No obvious rash, lesion, or ulcer.   LABORATORY PANEL:   CBC Recent Labs  Lab 08/09/23 1733  WBC 12.8*   HGB 14.5  HCT 43.6  PLT 123*   ------------------------------------------------------------------------------------------------------------------  Chemistries  Recent Labs  Lab 08/09/23 1733  NA 137  K 3.7  CL 106  CO2 21*  GLUCOSE 199*  BUN 21  CREATININE 1.30*  CALCIUM  8.7*  AST 28  ALT 15  ALKPHOS 69  BILITOT 3.1*   ------------------------------------------------------------------------------------------------------------------  Cardiac Enzymes No results for input(s): TROPONINI in the last 168 hours. ------------------------------------------------------------------------------------------------------------------  RADIOLOGY:  DG Chest 2 View Result Date: 08/09/2023 EXAM: 2 VIEW(S) XRAY OF THE CHEST 08/09/2023 05:48:01 PM COMPARISON: None available. CLINICAL HISTORY: SOB. PT to ED for SOB, body aches, fever since 2-3 days ago. Temp was 104 today, took tylenol . Seen at [facility] for same, prescribed PO abx and had negative covid test. Temp is 99.4 in triage. States temp goes down after tylenol  then goes back up. Alert and oriented. Blue top and lactic sent with labs. FINDINGS: LUNGS AND PLEURA: Dense airspace consolidation in the anterior right upper lobe, new since previous. Left lung clear. No pleural effusion. No pneumothorax. HEART AND MEDIASTINUM: Post CABG. Sternotomy wires. BONES AND SOFT TISSUES: No acute osseous abnormality. IMPRESSION: 1. Anterior right upper lobe pneumonia 2. Left lung clear. 3. No effusion. Electronically signed by: Katheleen Faes MD 08/09/2023 06:01 PM EDT RP Workstation: [id number]      IMPRESSION AND PLAN:  Assessment and Plan: * Sepsis due to pneumonia [facility]) - The patient will be admitted to a medical telemetry bed. - Will continue antibiotic therapy with IV Rocephin  and Zithromax . - Mucolytic therapy be provided as well as duo nebs q.i.d. and q.4 hours p.r.n. - We will follow blood cultures. -If his sepsis is manifested by  tachycardia 109, tachypnea of 30 and leukocytosis of 12.8. - Will be hydrated with IV lactated ringer .   AKI (acute kidney injury) (HCC) - This is likely prerenal due to volume depletion and mild dehydration. - He will be hydrated  with IV normal saline. - Will hold off nephrotoxins. - We will follow BMP.  Benign essential HTN - Will continue antihypertensive therapy.  Coronary artery disease - Will continue Imdur , beta-blocker therapy and statin therapy.  Peripheral neuropathy - Will continue Neurontin .  GERD without esophagitis - Will continue PPI therapy.  History of DVT (deep vein thrombosis) - Continue Xarelto .    DVT prophylaxis: Xarelto  Advanced Care Planning:  Code Status: full code. Family Communication:  The plan of care was discussed in details with the patient (and family). I answered all questions. The patient agreed to proceed with the above mentioned plan. Further management will depend upon hospital course. Disposition Plan: Back to previous home environment Consults called: none. All the records are reviewed and case discussed with ED provider.  Status is: Inpatient  At the time of the admission, it appears that the appropriate admission status for this patient is inpatient.  This is judged to be reasonable and necessary in order to provide the required intensity of service to ensure the patient's safety given the presenting symptoms, physical exam findings and initial radiographic and laboratory data in the context of comorbid conditions.  The patient requires inpatient status due to high intensity of service, high risk of further deterioration and high frequency of surveillance required.  I certify that at the time of admission, it is my clinical judgment that the patient will require inpatient hospital care extending more than 2 midnights.                            Dispo: The patient is from: Home              Anticipated d/c is to: Home               Patient currently is not medically stable to d/c.              Difficult to place patient: No  Madison DELENA Peaches M.D on 08/09/2023 at 8:26 PM  [facility]   From 7 PM-7 AM, contact night-coverage [email or web address]  CC: Primary care physician; Rudolpho Norleen BIRCH, MD

## 2023-08-10 DIAGNOSIS — J189 Pneumonia, unspecified organism: Secondary | ICD-10-CM | POA: Diagnosis not present

## 2023-08-10 DIAGNOSIS — A419 Sepsis, unspecified organism: Secondary | ICD-10-CM | POA: Diagnosis not present

## 2023-08-10 LAB — RESPIRATORY PANEL BY PCR

## 2023-08-10 LAB — CBC
HCT: 38.5 % — ABNORMAL LOW (ref 39.0–52.0)
Hemoglobin: 12.5 g/dL — ABNORMAL LOW (ref 13.0–17.0)
MCH: 30.6 pg (ref 26.0–34.0)
MCHC: 32.5 g/dL (ref 30.0–36.0)
MCV: 94.4 fL (ref 80.0–100.0)
Platelets: 102 K/uL — ABNORMAL LOW (ref 150–400)
RBC: 4.08 MIL/uL — ABNORMAL LOW (ref 4.22–5.81)
RDW: 16.3 % — ABNORMAL HIGH (ref 11.5–15.5)
WBC: 10.9 K/uL — ABNORMAL HIGH (ref 4.0–10.5)
nRBC: 0 % (ref 0.0–0.2)

## 2023-08-10 LAB — PROTIME-INR
INR: 1.9 — ABNORMAL HIGH (ref 0.8–1.2)
Prothrombin Time: 22.8 s — ABNORMAL HIGH (ref 11.4–15.2)

## 2023-08-10 LAB — BASIC METABOLIC PANEL WITH GFR
Anion gap: 9 (ref 5–15)
BUN: 19 mg/dL (ref 8–23)
CO2: 24 mmol/L (ref 22–32)
Calcium: 8 mg/dL — ABNORMAL LOW (ref 8.9–10.3)
Chloride: 105 mmol/L (ref 98–111)
Creatinine, Ser: 1.16 mg/dL (ref 0.61–1.24)
GFR, Estimated: >60 mL/min (ref >60)
Glucose, Bld: 120 mg/dL — ABNORMAL HIGH (ref 70–99)
Potassium: 3.6 mmol/L (ref 3.5–5.1)
Sodium: 138 mmol/L (ref 135–145)

## 2023-08-10 LAB — CORTISOL-AM, BLOOD: Cortisol - AM: 14.4 ug/dL (ref 6.7–22.6)

## 2023-08-10 LAB — STREP PNEUMONIAE URINARY ANTIGEN: Strep Pneumo Urinary Antigen: NEGATIVE

## 2023-08-10 LAB — PROCALCITONIN: Procalcitonin: 2.31 ng/mL

## 2023-08-10 MED ORDER — IPRATROPIUM-ALBUTEROL 0.5-2.5 (3) MG/3ML IN SOLN
3.0000 mL | Freq: Three times a day (TID) | RESPIRATORY_TRACT | Status: DC
  Administered 2023-08-10 – 2023-08-12 (×6): 3 mL via RESPIRATORY_TRACT
  Filled 2023-08-10 (×6): qty 3

## 2023-08-10 MED ORDER — ENSURE PLUS HIGH PROTEIN PO LIQD
237.0000 mL | Freq: Two times a day (BID) | ORAL | Status: DC
  Administered 2023-08-11: 237 mL via ORAL

## 2023-08-10 MED ORDER — SODIUM CHLORIDE 0.9 % IV SOLN: INTRAVENOUS | Status: AC

## 2023-08-10 NOTE — ED Notes (Signed)
 Notified that pt's daughter Bodin Gorka) called asking for a pt update. Pt verified that it is okay to update his daughter regarding his care. Update provided to pt's daughter.

## 2023-08-10 NOTE — Progress Notes (Signed)
  PROGRESS NOTE    Martin Adkins  FMW:[id number] DOB: 05-Dec-1945 DOA: 08/09/2023 PCP: Rudolpho Norleen BIRCH, MD  [id number]/[id number]-AA  LOS: 1 day   Brief hospital course:   Assessment & Plan: Martin Adkins is a 78 y.o. male with medical history significant for atrial fibrillation, BPH, coronary artery disease, cardiomyopathy, CLL, DVT on Xarelto , systolic CHF, dyslipidemia, coronary artery disease status post four-vessel CABG and PCI with stent placement as well as peripheral vascular disease, to emergency room with acute onset of high fever with a Tmax of 104.2 today.  It has been as high as 103 yesterday.  He has been having cough with inability to expectorate as well as dyspnea without wheezing.    * Severe Sepsis due to RUL pneumonia (HCC) - fever, tachycardia, leukocytosis, and AKI --CXR showed Dense airspace consolidation in the anterior right upper lobe. --cont ceftriaxone  and azithromycin  --RVP and strep pneumo antigen  Acute hypoxemic respiratory failure --while ambulating in the ED, desatted to 88% on 5 L of O2 by nasal cannula. He was then placed on high flow nasal cannula at 8 L/min and pulse oximetry was 92%.  Pt was then placed on BiPAP. --weaned down to 3L [place] today. --Continue supplemental O2 to keep sats >=92%, wean as tolerated  AKI (acute kidney injury) (HCC) --Cr 1.30 on presentation, baseline around 1. --cont MIVF  Benign essential HTN --cont amlodipine , Imdur  and Toprol   Coronary artery disease --cont statin  Peripheral neuropathy - Will continue Neurontin .  GERD without esophagitis - Will continue PPI therapy.  History of DVT (deep vein thrombosis) - Continue Xarelto .   DVT prophylaxis: On:Xarelto   Code Status: Full code  Family Communication:  Level of care: Med-Surg Dispo:   The patient is from: home Anticipated d/c is to: home Anticipated d/c date is: 2-3 days   Subjective and Interval History:  Pt reported breathing improved.  Had  cough.   Objective: Vitals:   08/10/23 0821 08/10/23 1000 08/10/23 1117 08/10/23 1524  BP:  130/70 119/67 133/68  Pulse:  94 96 (!) 103  Resp:  20 (!) 25 18  Temp: (!) 100.4 F (38 C)  100.3 F (37.9 C) (!) 102.4 F (39.1 C)  TempSrc: Oral  Oral   SpO2:  97% 97% 94%  Weight:      Height:        Intake/Output Summary (Last 24 hours) at 08/10/2023 1637 Last data filed at 08/09/2023 2215 Gross per 24 hour  Intake 1350 ml  Output 150 ml  Net 1200 ml   Filed Weights   08/09/23 1733  Weight: 93.9 kg    Examination:   Constitutional: NAD, AAOx3 HEENT: conjunctivae and lids normal, EOMI CV: No cyanosis.   RESP: normal respiratory effort, on [place] Neuro: II - XII grossly intact.   Psych: Normal mood and affect.  Appropriate judgement and reason   Data Reviewed: I have personally reviewed labs and imaging studies  Time spent: 50 minutes  Ellouise Haber, MD [facility] If 7PM-7AM, please contact night-coverage 08/10/2023, 4:37 PM

## 2023-08-10 NOTE — ED Notes (Signed)
 Warm blanket and urinal provided per pt request. Denies any needs at this time. Bed in lowest position with wheels locked. Side rails up. Call light within reach.

## 2023-08-10 NOTE — Plan of Care (Signed)
  Problem: Education: Goal: Knowledge of General Education information will improve Description: Including pain rating scale, medication(s)/side effects and non-pharmacologic comfort measures Outcome: Progressing   Problem: Nutrition: Goal: Adequate nutrition will be maintained Outcome: Progressing   Problem: Activity: Goal: Risk for activity intolerance will decrease Outcome: Progressing   Problem: Safety: Goal: Ability to remain free from injury will improve Outcome: Progressing

## 2023-08-11 DIAGNOSIS — A419 Sepsis, unspecified organism: Secondary | ICD-10-CM | POA: Diagnosis not present

## 2023-08-11 DIAGNOSIS — J189 Pneumonia, unspecified organism: Secondary | ICD-10-CM | POA: Diagnosis not present

## 2023-08-11 LAB — BASIC METABOLIC PANEL WITH GFR
Anion gap: 7 (ref 5–15)
BUN: 12 mg/dL (ref 8–23)
CO2: 26 mmol/L (ref 22–32)
Calcium: 7.9 mg/dL — ABNORMAL LOW (ref 8.9–10.3)
Chloride: 106 mmol/L (ref 98–111)
Creatinine, Ser: 0.9 mg/dL (ref 0.61–1.24)
GFR, Estimated: >60 mL/min (ref >60)
Glucose, Bld: 134 mg/dL — ABNORMAL HIGH (ref 70–99)
Potassium: 3.5 mmol/L (ref 3.5–5.1)
Sodium: 139 mmol/L (ref 135–145)

## 2023-08-11 LAB — MAGNESIUM: Magnesium: 2 mg/dL (ref 1.7–2.4)

## 2023-08-11 LAB — CBC
HCT: 37.8 % — ABNORMAL LOW (ref 39.0–52.0)
Hemoglobin: 12.6 g/dL — ABNORMAL LOW (ref 13.0–17.0)
MCH: 30.7 pg (ref 26.0–34.0)
MCHC: 33.3 g/dL (ref 30.0–36.0)
MCV: 92.2 fL (ref 80.0–100.0)
Platelets: 102 K/uL — ABNORMAL LOW (ref 150–400)
RBC: 4.1 MIL/uL — ABNORMAL LOW (ref 4.22–5.81)
RDW: 16.1 % — ABNORMAL HIGH (ref 11.5–15.5)
WBC: 6.6 K/uL (ref 4.0–10.5)
nRBC: 0 % (ref 0.0–0.2)

## 2023-08-11 MED ORDER — METOPROLOL SUCCINATE ER 50 MG PO TB24
50.0000 mg | ORAL_TABLET | Freq: Every day | ORAL | Status: DC
  Administered 2023-08-11 – 2023-08-13 (×3): 50 mg via ORAL
  Filled 2023-08-11 (×3): qty 1

## 2023-08-11 NOTE — Progress Notes (Signed)
  PROGRESS NOTE    Martin Adkins  FMW:[id number] DOB: Jan 11, 1945 DOA: 08/09/2023 PCP: Rudolpho Norleen BIRCH, MD  [id number]/[id number]-AA  LOS: 2 days   Brief hospital course:   Assessment & Plan: Martin Adkins is a 78 y.o. male with medical history significant for atrial fibrillation, BPH, coronary artery disease, cardiomyopathy, CLL, DVT on Xarelto , systolic CHF, dyslipidemia, coronary artery disease status post four-vessel CABG and PCI with stent placement as well as peripheral vascular disease, to emergency room with acute onset of high fever with a Tmax of 104.2 today.  It has been as high as 103 yesterday.  He has been having cough with inability to expectorate as well as dyspnea without wheezing.    * Severe Sepsis due to RUL pneumonia (HCC) - fever, tachycardia, leukocytosis, and AKI --CXR showed Dense airspace consolidation in the anterior right upper lobe. --RVP and strep pneumo antigen neg --cont ceftriaxone  and azithro  Acute hypoxemic respiratory failure --while ambulating in the ED, desatted to 88% on 5 L of O2 by nasal cannula. He was then placed on high flow nasal cannula at 8 L/min and pulse oximetry was 92%.  Pt was then placed on BiPAP. --weaned down to RA today  AKI (acute kidney injury) (HCC) --Cr 1.30 on presentation, baseline around 1. --improved with IVF --oral hydration now  Benign essential HTN --cont amlodipine , Imdur  and Toprol   Coronary artery disease --cont statin  Peripheral neuropathy --cont gabapentin   GERD without esophagitis - Will continue PPI therapy.  History of DVT (deep vein thrombosis) - Continue Xarelto .   DVT prophylaxis: On:Xarelto   Code Status: Full code  Family Communication: brother updated at bedside today Level of care: Med-Surg Dispo:   The patient is from: home Anticipated d/c is to: home Anticipated d/c date is: 2-3 days   Subjective and Interval History:  Still having fevers.  Pt reported not being able to cough up  sputum.  Weaned down to RA today.   Objective: Vitals:   08/11/23 1307 08/11/23 1524 08/11/23 1552 08/11/23 1928  BP:   (!) 113/48 120/82  Pulse:   88 90  Resp:   18 18  Temp: (!) 101.2 F (38.4 C) 99 F (37.2 C) 98.8 F (37.1 C) 98.6 F (37 C)  TempSrc: Oral Oral Oral   SpO2:   95% 98%  Weight:      Height:        Intake/Output Summary (Last 24 hours) at 08/11/2023 1929 Last data filed at 08/11/2023 1504 Gross per 24 hour  Intake 1512.86 ml  Output 800 ml  Net 712.86 ml   Filed Weights   08/09/23 1733  Weight: 93.9 kg    Examination:   Constitutional: NAD, AAOx3 HEENT: conjunctivae and lids normal, EOMI CV: No cyanosis.   RESP: normal respiratory effort, on RA Extremities: No effusions, edema in BLE SKIN: warm, dry Neuro: II - XII grossly intact.   Psych: Normal mood and affect.  Appropriate judgement and reason   Data Reviewed: I have personally reviewed labs and imaging studies  Time spent: 35 minutes  Ellouise Haber, MD [facility] If 7PM-7AM, please contact night-coverage 08/11/2023, 7:29 PM

## 2023-08-12 DIAGNOSIS — J189 Pneumonia, unspecified organism: Secondary | ICD-10-CM | POA: Diagnosis not present

## 2023-08-12 DIAGNOSIS — A419 Sepsis, unspecified organism: Secondary | ICD-10-CM | POA: Diagnosis not present

## 2023-08-12 MED ORDER — ALBUTEROL SULFATE (2.5 MG/3ML) 0.083% IN NEBU: 2.5000 mg | INHALATION_SOLUTION | Freq: Four times a day (QID) | RESPIRATORY_TRACT | Status: DC | PRN

## 2023-08-12 MED ORDER — IPRATROPIUM-ALBUTEROL 0.5-2.5 (3) MG/3ML IN SOLN
3.0000 mL | Freq: Two times a day (BID) | RESPIRATORY_TRACT | Status: DC
  Administered 2023-08-13: 3 mL via RESPIRATORY_TRACT
  Filled 2023-08-12: qty 3

## 2023-08-12 MED ORDER — IBUPROFEN 400 MG PO TABS
400.0000 mg | ORAL_TABLET | Freq: Once | ORAL | Status: AC
  Administered 2023-08-12: 400 mg via ORAL
  Filled 2023-08-12: qty 1

## 2023-08-12 MED ORDER — ACETAMINOPHEN 500 MG PO TABS
1000.0000 mg | ORAL_TABLET | Freq: Once | ORAL | Status: AC
  Administered 2023-08-12: 1000 mg via ORAL
  Filled 2023-08-12: qty 2

## 2023-08-12 MED ORDER — AZITHROMYCIN 500 MG PO TABS
500.0000 mg | ORAL_TABLET | Freq: Every day | ORAL | Status: AC
  Administered 2023-08-12 – 2023-08-13 (×2): 500 mg via ORAL
  Filled 2023-08-12 (×2): qty 1

## 2023-08-12 NOTE — Progress Notes (Signed)
 PHARMACIST - PHYSICIAN COMMUNICATION  CONCERNING: Antibiotic IV to Oral Route Change Policy  RECOMMENDATION: This patient is receiving azithromycin  by the intravenous route.  Based on criteria approved by the Pharmacy and Therapeutics Committee, the antibiotic(s) is/are being converted to the equivalent oral dose form(s).  DESCRIPTION: These criteria include: Patient being treated for a respiratory tract infection, urinary tract infection, cellulitis or clostridium difficile associated diarrhea if on metronidazole  The patient is not neutropenic and does not exhibit a GI malabsorption state The patient is eating (either orally or via tube) and/or has been taking other orally administered medications for a least 24 hours The patient is improving clinically and has a Tmax < 100.5  If you have questions about this conversion, please contact the Pharmacy Department  []   [phone] )  [facility] [x]   [phone] )  [facility] []   [phone] )  [facility] []   [phone] )  [facility] []   [phone] )  [facility]    Thank you for involving pharmacy in this patient's care.   Damien Napoleon, PharmD Clinical Pharmacist 08/12/2023 11:53 AM

## 2023-08-12 NOTE — Care Management Important Message (Signed)
 Important Message  Patient Details  Name: Martin Adkins MRN: [id number] Date of Birth: February 26, 1945   Important Message Given:  Yes - Medicare IM     Jenavive Lamboy W, CMA 08/12/2023, 2:51 PM

## 2023-08-12 NOTE — Progress Notes (Signed)
  PROGRESS NOTE    Martin Adkins  FMW:[id number] DOB: 04-24-1945 DOA: 08/09/2023 PCP: Rudolpho Norleen BIRCH, MD  [id number]/[id number]-AA  LOS: 3 days   Brief hospital course:   Assessment & Plan: Martin Adkins is a 78 y.o. male with medical history significant for atrial fibrillation, BPH, coronary artery disease, cardiomyopathy, CLL, DVT on Xarelto , systolic CHF, dyslipidemia, coronary artery disease status post four-vessel CABG and PCI with stent placement as well as peripheral vascular disease, to emergency room with acute onset of high fever with a Tmax of 104.2 today.  It has been as high as 103 yesterday.  He has been having cough with inability to expectorate as well as dyspnea without wheezing.    * Severe Sepsis due to RUL pneumonia (HCC) - fever, tachycardia, leukocytosis, and AKI --CXR showed Dense airspace consolidation in the anterior right upper lobe. --RVP and strep pneumo antigen neg --cont ceftriaxone  and azithro, day 4 of 5  Acute hypoxemic respiratory failure --while ambulating in the ED, desatted to 88% on 5 L of O2 by nasal cannula. He was then placed on high flow nasal cannula at 8 L/min and pulse oximetry was 92%.  Pt was then placed on BiPAP. --weaned down to RA on 8/4  AKI (acute kidney injury) (HCC) --Cr 1.30 on presentation, baseline around 1. --improved with IVF --oral hydration now  Benign essential HTN --cont amlodipine , Imdur  and Toprol   Coronary artery disease --cont statin  Peripheral neuropathy --cont gabapentin   GERD without esophagitis --cont PPI  History of DVT (deep vein thrombosis) - Continue Xarelto .   DVT prophylaxis: On:Xarelto   Code Status: Full code  Family Communication:  Level of care: Med-Surg Dispo:   The patient is from: home Anticipated d/c is to: home Anticipated d/c date is: tomorrow   Subjective and Interval History:  Pt reported feeling better.  No fever today.   Objective: Vitals:   08/11/23 1952 08/12/23 0736  08/12/23 0738 08/12/23 1524  BP:  127/71 127/71 125/66  Pulse:  84 84 75  Resp:  16 18 17   Temp:  99.3 F (37.4 C) 99.3 F (37.4 C) 97.7 F (36.5 C)  TempSrc:  Oral Oral Oral  SpO2: 92% 96% 96% 98%  Weight:      Height:        Intake/Output Summary (Last 24 hours) at 08/12/2023 2008 Last data filed at 08/12/2023 1900 Gross per 24 hour  Intake 840 ml  Output 50 ml  Net 790 ml   Filed Weights   08/09/23 1733  Weight: 93.9 kg    Examination:   Constitutional: NAD, AAOx3 HEENT: conjunctivae and lids normal, EOMI CV: No cyanosis.   RESP: normal respiratory effort, on RA Neuro: II - XII grossly intact.   Psych: Normal mood and affect.  Appropriate judgement and reason   Data Reviewed: I have personally reviewed labs and imaging studies  Time spent: 35 minutes  Martin Haber, MD [facility] If 7PM-7AM, please contact night-coverage 08/12/2023, 8:08 PM

## 2023-08-12 NOTE — Plan of Care (Signed)

## 2023-08-13 MED ORDER — GUAIFENESIN 100 MG/5ML PO LIQD: 5.0000 mL | ORAL | 0 refills | Status: AC | PRN

## 2023-08-13 MED ORDER — ACETAMINOPHEN 325 MG PO TABS: 650.0000 mg | ORAL_TABLET | Freq: Four times a day (QID) | ORAL | 0 refills | Status: AC | PRN

## 2023-08-13 MED ORDER — METOPROLOL SUCCINATE ER 100 MG PO TB24: 50.0000 mg | ORAL_TABLET | Freq: Every day | ORAL | 0 refills | Status: AC

## 2023-08-13 MED ORDER — CEFDINIR 300 MG PO CAPS: 300.0000 mg | ORAL_CAPSULE | Freq: Two times a day (BID) | ORAL | 0 refills | Status: AC

## 2023-08-13 NOTE — Plan of Care (Signed)
  Problem: Respiratory: Goal: Ability to maintain adequate ventilation will improve Outcome: Progressing   Problem: Education: Goal: Knowledge of General Education information will improve Description: Including pain rating scale, medication(s)/side effects and non-pharmacologic comfort measures Outcome: Progressing   Problem: Clinical Measurements: Goal: Respiratory complications will improve Outcome: Progressing

## 2023-08-13 NOTE — Progress Notes (Signed)
 Patient oxygenation 98% on room air at rest.  Patient oxygenation 95% on room air with ambulation.  Patient oxygenation 98% on room air at rest after ambulation.

## 2023-08-13 NOTE — Plan of Care (Signed)
  Problem: Fluid Volume: Goal: Hemodynamic stability will improve Outcome: Adequate for Discharge   Problem: Clinical Measurements: Goal: Diagnostic test results will improve Outcome: Adequate for Discharge Goal: Signs and symptoms of infection will decrease Outcome: Adequate for Discharge   Problem: Respiratory: Goal: Ability to maintain adequate ventilation will improve 08/13/2023 1353 by Les Delon CROME, RN Outcome: Adequate for Discharge 08/13/2023 1048 by Les Delon CROME, RN Outcome: Progressing   Problem: Education: Goal: Knowledge of General Education information will improve Description: Including pain rating scale, medication(s)/side effects and non-pharmacologic comfort measures 08/13/2023 1353 by Les Delon CROME, RN Outcome: Adequate for Discharge 08/13/2023 1048 by Les Delon CROME, RN Outcome: Progressing   Problem: Health Behavior/Discharge Planning: Goal: Ability to manage health-related needs will improve Outcome: Adequate for Discharge   Problem: Clinical Measurements: Goal: Ability to maintain clinical measurements within normal limits will improve Outcome: Adequate for Discharge Goal: Will remain free from infection Outcome: Adequate for Discharge Goal: Diagnostic test results will improve Outcome: Adequate for Discharge Goal: Respiratory complications will improve 08/13/2023 1353 by Les Delon CROME, RN Outcome: Adequate for Discharge 08/13/2023 1048 by Les Delon CROME, RN Outcome: Progressing Goal: Cardiovascular complication will be avoided Outcome: Adequate for Discharge   Problem: Activity: Goal: Risk for activity intolerance will decrease Outcome: Adequate for Discharge   Problem: Nutrition: Goal: Adequate nutrition will be maintained Outcome: Adequate for Discharge   Problem: Coping: Goal: Level of anxiety will decrease Outcome: Adequate for Discharge   Problem: Elimination: Goal: Will not experience  complications related to bowel motility Outcome: Adequate for Discharge Goal: Will not experience complications related to urinary retention Outcome: Adequate for Discharge   Problem: Safety: Goal: Ability to remain free from injury will improve Outcome: Adequate for Discharge   Problem: Skin Integrity: Goal: Risk for impaired skin integrity will decrease Outcome: Adequate for Discharge

## 2023-08-13 NOTE — Plan of Care (Signed)

## 2023-08-13 NOTE — Progress Notes (Signed)
 Patient being discharged home.  Patient to be transported by his son.  IV removed with the catheter intact.  Discharge instructions and prescription information given to the patient who verbalized understanding.

## 2023-08-13 NOTE — Discharge Summary (Addendum)
 Physician Discharge Summary   Patient: Martin Adkins MRN: [id number] DOB: 25-Nov-1945  Admit date:     08/09/2023  Discharge date: 08/13/23  Discharge Physician: Laree Lock   PCP: Rudolpho Norleen BIRCH, MD   Recommendations at discharge:  Follow up with PCP within 1 week - repeat BMP, CBC Monitor BP and resume meds as tolerated - held Losartan , Imdur , amlodipine    Discharge Diagnoses: Principal Problem:   Sepsis due to pneumonia [facility]) Active Problems:   AKI (acute kidney injury) (HCC)   Benign essential HTN   Coronary artery disease   History of DVT (deep vein thrombosis)   GERD without esophagitis   Peripheral neuropathy  Resolved Problems:   * No resolved hospital problems. *  Hospital Course: Martin Adkins is a 78 y.o. male with medical history significant for atrial fibrillation, BPH, coronary artery disease, cardiomyopathy, CLL, DVT on Xarelto , systolic CHF, dyslipidemia, coronary artery disease status post four-vessel CABG and PCI with stent placement as well as peripheral vascular disease admitted due sepsis due to RUL pneumonia, acute hypoxic respiratory failure which resolved.  Hospital course as below  Assessment and Plan: Severe Sepsis due to RUL pneumonia (HCC) - fever, tachycardia, leukocytosis, and AKI, procalcitonin elevated - improved --CXR showed Dense airspace consolidation in the anterior right upper lobe. --RVP and strep pneumo antigen neg --Bcx NGTD -- was on IV ceftriaxone , discharged on po Cefdinir  to complete course, completed Azithromycin    Acute hypoxemic respiratory failure - resolved - weaned off oxygen, not requiring oxygen at rest/ambulation   AKI (acute kidney injury) - resolved - s/p IV fluids   Benign essential HTN - Hold amlodipine , Imdur , Losartan  - Decreased dose Toprol  - Follow up with PCP and resume as tolerated   Coronary artery disease - cont statin   Peripheral neuropathy - cont gabapentin    GERD without  esophagitis - cont PPI   History of DVT (deep vein thrombosis) - Continue Xarelto   Consultants: None Procedures performed: None  Disposition: Home Diet recommendation:  Discharge Diet Orders (From admission, onward)     Start     Ordered   08/13/23 0000  Diet - low sodium heart healthy        08/13/23 1332           Cardiac diet DISCHARGE MEDICATION: Allergies as of 08/13/2023       Reactions   Ace Inhibitors Other (See Comments)   Other reaction(s): Cough   Amoxicillin Rash   Has patient had a PCN reaction causing immediate rash, facial/tongue/throat swelling, SOB or lightheadedness with hypotension: Yes Has patient had a PCN reaction causing severe rash involving mucus membranes or skin necrosis: Unknown Has patient had a PCN reaction that required hospitalization: Unknown Has patient had a PCN reaction occurring within the last 10 years: No If all of the above answers are NO, then may proceed with Cephalosporin use.   Niacin Rash   Penicillins Rash   Has patient had a PCN reaction causing immediate rash, facial/tongue/throat swelling, SOB or lightheadedness with hypotension: Yes Has patient had a PCN reaction causing severe rash involving mucus membranes or skin necrosis: Unknown Has patient had a PCN reaction that required hospitalization: Unknown Has patient had a PCN reaction occurring within the last 10 years: No If all of the above answers are NO, then may proceed with Cephalosporin use.   Pravastatin Other (See Comments)   Other reaction(s): Muscle Pain   Rosuvastatin Other (See Comments)   Other reaction(s): Other (See Comments) GI  bleed   Simvastatin Other (See Comments)   Other reaction(s): Muscle Pain        Medication List     PAUSE taking these medications    amLODipine  10 MG tablet Wait to take this until your doctor or other care provider tells you to start again. Commonly known as: NORVASC  Take 10 mg by mouth daily.   isosorbide   mononitrate 60 MG 24 hr tablet Wait to take this until your doctor or other care provider tells you to start again. Commonly known as: IMDUR  Take 1 tablet (60 mg total) by mouth daily.   losartan  25 MG tablet Wait to take this until your doctor or other care provider tells you to start again. Commonly known as: COZAAR  Take 25 mg by mouth daily.       STOP taking these medications    doxycycline 100 MG capsule Commonly known as: VIBRAMYCIN       TAKE these medications    acetaminophen  325 MG tablet Commonly known as: TYLENOL  Take 2 tablets (650 mg total) by mouth every 6 (six) hours as needed for mild pain (pain score 1-3) or fever (or Fever >/= 101).   albuterol  108 (90 Base) MCG/ACT inhaler Commonly known as: VENTOLIN  HFA SMARTSIG:2 inhalation Via Inhaler Every 6 Hours PRN   atorvastatin  80 MG tablet Commonly known as: LIPITOR  Take 80 mg by mouth daily.   cefdinir  300 MG capsule Commonly known as: OMNICEF  Take 1 capsule (300 mg total) by mouth 2 (two) times daily for 3 days.   DULoxetine 30 MG capsule Commonly known as: CYMBALTA Take by mouth.   fluticasone  50 MCG/ACT nasal spray Commonly known as: FLONASE  Place 2 sprays into both nostrils daily as needed for allergies.   gabapentin  300 MG capsule Commonly known as: NEURONTIN  Take 1 capsule (300 mg total) by mouth 3 (three) times daily. What changed:  how much to take when to take this   guaiFENesin  100 MG/5ML liquid Commonly known as: ROBITUSSIN Take 5 mLs by mouth every 4 (four) hours as needed for cough or to loosen phlegm.   latanoprost  0.005 % ophthalmic solution Commonly known as: XALATAN  Place 1 drop into both eyes at bedtime.   methocarbamol  500 MG tablet Commonly known as: ROBAXIN  Take by mouth.   metoprolol  succinate 100 MG 24 hr tablet Commonly known as: TOPROL -XL Take 0.5 tablets (50 mg total) by mouth daily. What changed: how much to take   MULTIVITAMIN MEN 50+ PO Take 1 tablet by  mouth daily.   pantoprazole  40 MG tablet Commonly known as: PROTONIX  Take 1 tablet (40 mg total) by mouth 2 (two) times daily before a meal.   pantoprazole  40 MG tablet Commonly known as: PROTONIX  Take 40 mg by mouth daily.   potassium chloride  SA 20 MEQ tablet Commonly known as: KLOR-CON  M Take 20 mEq by mouth daily.   rivaroxaban  20 MG Tabs tablet Commonly known as: Xarelto  Take 1 tablet (20 mg total) by mouth daily with supper.   tiZANidine 2 MG tablet Commonly known as: ZANAFLEX Take between 1mg  (half a tablet) and 4mg  (two tablets) up to twice daily as needed for musculoskeletal pain.   Trelegy Ellipta 100-62.5-25 MCG/ACT Aepb Generic drug: Fluticasone -Umeclidin-Vilant Inhale 1 puff into the lungs daily.   Varenicline Tartrate (Starter) 0.5 MG X 11 & 1 MG X 42 Tbpk See admin instructions. follow package directions   vitamin D3 50 MCG (2000 UT) Caps Take 2,000 Units/day by mouth daily.  Discharge Exam: Filed Weights   08/09/23 1733  Weight: 93.9 kg   Constitutional: NAD, AAOx3 HEENT: conjunctivae and lids normal, EOMI CV: No cyanosis.   RESP: normal respiratory effort, on RA Neuro: II - XII grossly intact.   Psych: Normal mood and affect.  Appropriate judgement and reason  Condition at discharge: good  The results of significant diagnostics from this hospitalization (including imaging, microbiology, ancillary and laboratory) are listed below for reference.   Imaging Studies: DG Chest 2 View Result Date: 08/09/2023 EXAM: 2 VIEW(S) XRAY OF THE CHEST 08/09/2023 05:48:01 PM COMPARISON: None available. CLINICAL HISTORY: SOB. PT to ED for SOB, body aches, fever since 2-3 days ago. Temp was 104 today, took tylenol . Seen at [facility] for same, prescribed PO abx and had negative covid test. Temp is 99.4 in triage. States temp goes down after tylenol  then goes back up. Alert and oriented. Blue top and lactic sent with labs. FINDINGS: LUNGS AND PLEURA: Dense  airspace consolidation in the anterior right upper lobe, new since previous. Left lung clear. No pleural effusion. No pneumothorax. HEART AND MEDIASTINUM: Post CABG. Sternotomy wires. BONES AND SOFT TISSUES: No acute osseous abnormality. IMPRESSION: 1. Anterior right upper lobe pneumonia 2. Left lung clear. 3. No effusion. Electronically signed by: Dayne Hassell MD 08/09/2023 06:01 PM EDT RP Workstation: [id number]    Microbiology: Results for orders placed or performed during the hospital encounter of 08/09/23  Resp panel by RT-PCR (RSV, Flu A&B, Covid) Anterior Nasal Swab     Status: None   Collection Time: 08/09/23  5:33 PM   Specimen: Anterior Nasal Swab  Result Value Ref Range Status   SARS Coronavirus 2 by RT PCR NEGATIVE NEGATIVE Final    Comment: (NOTE) SARS-CoV-2 target nucleic acids are NOT DETECTED.  The SARS-CoV-2 RNA is generally detectable in upper respiratory specimens during the acute phase of infection. The lowest concentration of SARS-CoV-2 viral copies this assay can detect is 138 copies/mL. A negative result does not preclude SARS-Cov-2 infection and should not be used as the sole basis for treatment or other patient management decisions. A negative result may occur with  improper specimen collection/handling, submission of specimen other than nasopharyngeal swab, presence of viral mutation(s) within the areas targeted by this assay, and inadequate number of viral copies(<138 copies/mL). A negative result must be combined with clinical observations, patient history, and epidemiological information. The expected result is Negative.  Fact Sheet for Patients:  BloggerCourse.com  Fact Sheet for Healthcare Providers:  SeriousBroker.it  This test is no t yet approved or cleared by the [place]  FDA and  has been authorized for detection and/or diagnosis of SARS-CoV-2 by FDA under an Emergency Use Authorization (EUA).  This EUA will remain  in effect (meaning this test can be used) for the duration of the COVID-19 declaration under Section 564(b)(1) of the Act, 21 U.S.C.section 360bbb-3(b)(1), unless the authorization is terminated  or revoked sooner.       Influenza A by PCR NEGATIVE NEGATIVE Final   Influenza B by PCR NEGATIVE NEGATIVE Final    Comment: (NOTE) The Xpert Xpress SARS-CoV-2/FLU/RSV plus assay is intended as an aid in the diagnosis of influenza from Nasopharyngeal swab specimens and should not be used as a sole basis for treatment. Nasal washings and aspirates are unacceptable for Xpert Xpress SARS-CoV-2/FLU/RSV testing.  Fact Sheet for Patients: BloggerCourse.com  Fact Sheet for Healthcare Providers: SeriousBroker.it  This test is not yet approved or cleared by the [place]  FDA and has  been authorized for detection and/or diagnosis of SARS-CoV-2 by FDA under an Emergency Use Authorization (EUA). This EUA will remain in effect (meaning this test can be used) for the duration of the COVID-19 declaration under Section 564(b)(1) of the Act, 21 U.S.C. section 360bbb-3(b)(1), unless the authorization is terminated or revoked.     Resp Syncytial Virus by PCR NEGATIVE NEGATIVE Final    Comment: (NOTE) Fact Sheet for Patients: BloggerCourse.com  Fact Sheet for Healthcare Providers: SeriousBroker.it  This test is not yet approved or cleared by the [place]  FDA and has been authorized for detection and/or diagnosis of SARS-CoV-2 by FDA under an Emergency Use Authorization (EUA). This EUA will remain in effect (meaning this test can be used) for the duration of the COVID-19 declaration under Section 564(b)(1) of the Act, 21 U.S.C. section 360bbb-3(b)(1), unless the authorization is terminated or revoked.  Performed at [facility], [address]  [place], [place] [address]   Culture, blood (routine x 2)     Status: None (Preliminary result)   Collection Time: 08/09/23  7:00 PM   Specimen: BLOOD  Result Value Ref Range Status   Specimen Description BLOOD BLOOD RIGHT HAND  Final   Special Requests   Final    BOTTLES DRAWN AEROBIC AND ANAEROBIC Blood Culture results may not be optimal due to an excessive volume of blood received in culture bottles   Culture   Final    NO GROWTH 4 DAYS Performed at [facility], [address] [place], [place] [address]    Report Status PENDING  Incomplete  Culture, blood (routine x 2)     Status: None (Preliminary result)   Collection Time: 08/09/23  7:00 PM   Specimen: BLOOD  Result Value Ref Range Status   Specimen Description BLOOD BLOOD RIGHT ARM  Final   Special Requests   Final    BOTTLES DRAWN AEROBIC AND ANAEROBIC Blood Culture results may not be optimal due to an excessive volume of blood received in culture bottles   Culture   Final    NO GROWTH 4 DAYS Performed at [facility], [address] [place], [place] [address]    Report Status PENDING  Incomplete  Respiratory (~20 pathogens) panel by PCR     Status: None   Collection Time: 08/10/23  4:20 PM   Specimen: Nasopharyngeal Swab; Respiratory  Result Value Ref Range Status   Adenovirus NOT DETECTED NOT DETECTED Final   Coronavirus 229E NOT DETECTED NOT DETECTED Final    Comment: (NOTE) The Coronavirus on the Respiratory Panel, DOES NOT test for the novel  Coronavirus (2019 nCoV)    Coronavirus HKU1 NOT DETECTED NOT DETECTED Final   Coronavirus NL63 NOT DETECTED NOT DETECTED Final   Coronavirus OC43 NOT DETECTED NOT DETECTED Final   Metapneumovirus NOT DETECTED NOT DETECTED Final   Rhinovirus / Enterovirus NOT DETECTED NOT DETECTED Final   Influenza A NOT DETECTED NOT DETECTED Final   Influenza B NOT DETECTED NOT DETECTED Final   Parainfluenza Virus 1 NOT DETECTED NOT DETECTED Final   Parainfluenza Virus 2  NOT DETECTED NOT DETECTED Final   Parainfluenza Virus 3 NOT DETECTED NOT DETECTED Final   Parainfluenza Virus 4 NOT DETECTED NOT DETECTED Final   Respiratory Syncytial Virus NOT DETECTED NOT DETECTED Final   Bordetella pertussis NOT DETECTED NOT DETECTED Final   Bordetella Parapertussis NOT DETECTED NOT DETECTED Final   Chlamydophila pneumoniae NOT DETECTED NOT DETECTED Final   Mycoplasma pneumoniae NOT DETECTED NOT  DETECTED Final    Comment: Performed at [facility], [address], [place], [place] [address]    Labs: CBC: Recent Labs  Lab 08/09/23 1733 08/10/23 0455 08/11/23 0529  WBC 12.8* 10.9* 6.6  HGB 14.5 12.5* 12.6*  HCT 43.6 38.5* 37.8*  MCV 93.0 94.4 92.2  PLT 123* 102* 102*   Basic Metabolic Panel: Recent Labs  Lab 08/09/23 1733 08/10/23 0455 08/11/23 0529  NA 137 138 139  K 3.7 3.6 3.5  CL 106 105 106  CO2 21* 24 26  GLUCOSE 199* 120* 134*  BUN 21 19 12   CREATININE 1.30* 1.16 0.90  CALCIUM  8.7* 8.0* 7.9*  MG  --   --  2.0   Liver Function Tests: Recent Labs  Lab 08/09/23 1733  AST 28  ALT 15  ALKPHOS 69  BILITOT 3.1*  PROT 6.7  ALBUMIN 3.5   CBG: No results for input(s): GLUCAP in the last 168 hours.  Discharge time spent: greater than 30 minutes.  Signed: Laree Lock, MD [facility] 08/13/2023

## 2023-08-14 LAB — CULTURE, BLOOD (ROUTINE X 2)
Culture: NO GROWTH
Culture: NO GROWTH

## 2023-08-15 DIAGNOSIS — E119 Type 2 diabetes mellitus without complications: Secondary | ICD-10-CM | POA: Diagnosis not present

## 2023-08-19 DIAGNOSIS — Z09 Encounter for follow-up examination after completed treatment for conditions other than malignant neoplasm: Secondary | ICD-10-CM | POA: Diagnosis not present

## 2023-08-19 DIAGNOSIS — Z125 Encounter for screening for malignant neoplasm of prostate: Secondary | ICD-10-CM | POA: Diagnosis not present

## 2023-08-19 DIAGNOSIS — J188 Other pneumonia, unspecified organism: Secondary | ICD-10-CM | POA: Diagnosis not present

## 2023-08-19 DIAGNOSIS — J189 Pneumonia, unspecified organism: Secondary | ICD-10-CM | POA: Diagnosis not present

## 2023-08-27 DIAGNOSIS — I25810 Atherosclerosis of coronary artery bypass graft(s) without angina pectoris: Secondary | ICD-10-CM | POA: Diagnosis not present

## 2023-08-27 DIAGNOSIS — I5022 Chronic systolic (congestive) heart failure: Secondary | ICD-10-CM | POA: Diagnosis not present

## 2023-09-01 DIAGNOSIS — I6523 Occlusion and stenosis of bilateral carotid arteries: Secondary | ICD-10-CM | POA: Diagnosis not present

## 2023-09-01 DIAGNOSIS — I480 Paroxysmal atrial fibrillation: Secondary | ICD-10-CM | POA: Diagnosis not present

## 2023-09-01 DIAGNOSIS — H539 Unspecified visual disturbance: Secondary | ICD-10-CM | POA: Diagnosis not present

## 2023-09-01 DIAGNOSIS — R42 Dizziness and giddiness: Secondary | ICD-10-CM | POA: Diagnosis not present

## 2023-09-02 DIAGNOSIS — I6523 Occlusion and stenosis of bilateral carotid arteries: Secondary | ICD-10-CM | POA: Diagnosis not present

## 2023-09-11 DIAGNOSIS — I480 Paroxysmal atrial fibrillation: Secondary | ICD-10-CM | POA: Diagnosis not present

## 2023-09-11 DIAGNOSIS — I5022 Chronic systolic (congestive) heart failure: Secondary | ICD-10-CM | POA: Diagnosis not present

## 2023-09-11 DIAGNOSIS — G4733 Obstructive sleep apnea (adult) (pediatric): Secondary | ICD-10-CM | POA: Diagnosis not present

## 2023-09-11 DIAGNOSIS — I739 Peripheral vascular disease, unspecified: Secondary | ICD-10-CM | POA: Diagnosis not present

## 2023-09-11 DIAGNOSIS — I82412 Acute embolism and thrombosis of left femoral vein: Secondary | ICD-10-CM | POA: Diagnosis not present

## 2023-09-11 DIAGNOSIS — I7121 Aneurysm of the ascending aorta, without rupture: Secondary | ICD-10-CM | POA: Diagnosis not present

## 2023-09-11 DIAGNOSIS — I25810 Atherosclerosis of coronary artery bypass graft(s) without angina pectoris: Secondary | ICD-10-CM | POA: Diagnosis not present

## 2023-09-11 DIAGNOSIS — I10 Essential (primary) hypertension: Secondary | ICD-10-CM | POA: Diagnosis not present

## 2023-09-11 DIAGNOSIS — I714 Abdominal aortic aneurysm, without rupture, unspecified: Secondary | ICD-10-CM | POA: Diagnosis not present

## 2023-09-11 NOTE — Progress Notes (Addendum)
 Established Patient Visit   Chief Complaint: Chief Complaint  Patient presents with  . Follow-up    Testing    Date of Service: 09/11/2023 Date of Birth: [birth year] PCP: Rudolpho Norleen Lenis, MD [id number] [facility] [address] [facility] [place] [address]  History of Present Illness:   Mr. Costilla is a 78 y.o.male patient that presents for f/u to discuss results.    Presents for f/u r/t: Atrial fibrillation PVD - carotid stenosis, PAD, AAA -  follows with Vascular Surgery 09/2022 visit: Duplex today shows a patent stent graft without endoleak. The aortic sac diameter is stable at 4.1 cm. ABIs remain noncompressible but digit pressures are normal and waveforms remain multiphasic. Carotid duplex today shows no significant carotid artery occlusive disease in either carotid artery. Both vertebral arteries remain antegrade.  CAD s/p CABG with LIMA to LAD, SVG to D1, OM1 and RCA 2004 with occluded SVG to RCA and D1 and PCI stent of OM1 graft 2015  Last LHC 01/19/2018 revealed 90% stenosis of D1. RCA has acute marginal with 99% stenosis. RCA had no significant disease. Vein graft to RCA is chronically occluded. Vein graft to diagonal is chronically occluded. Vein graft to OM has stent with proximal occlusion. LIMA to LAD is widely patent. Occluded proximal LCX. Occluded proximal to mid LAD. Medical management pursued. Not amenable to PCI or further coronary bypass grafting. EF 35-45%.  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 Babara GERD COPD  Seen 09/01/23 for 6 month follow up. He was admitted to [facility] 08/09/23 with pneumonia and sepsis. He reports he is still fatigued but is overall  improving since hospital discharge. Saw his PCP for hospital f/u 8/12. He continues on antibiotics and steroids. He reports new dizziness since his last visit, noted mostly with position changes but also at other times. Denies presyncope or syncope. Additionally reports some blurry vision, which reports is chronic. Follows with Ophthalmology for glaucoma and cataracts. Follows with Neurology for intractable migraine and occipital neuralgia and has discussed dizziness with them as well. He is cutting down his tobacco use with Chantix. He denies chest pain, dyspnea, edema, weight gain, orthopnea, palpitations. He denies bleeding with Xarelto . Following low salt diet. Not weighing daily. Thinks he has lost a few pounds since last visit. Peripheral neuropathy limits his exercise. 2D ECHO 07/30/22 showed mild concentric LVH with normal wall motion, EF > 55%, normal RV systolic function, mild valvular regurgitation with no valvular stenosis. Ascending aorta dilatation to 3.9 cm. Prior ECHO 09/19/21 with EF 40% and ascending aorta aneurysm 4.1 cm. Repeat ECHO for aortic dilatation ordered at last visit with new dizziness and revealed EF 30% with global LV wall hypokinesis, akinetic posterior basal wall, G2DD, normal RV systolic function, moderate AR, mild MR, PR, and TR. No valvular stenosis. Mild pulmonary HTN (RVSP 43 mmHg). Carotid US  ordered with new dizziness, completed 09/02/23 and showed mild-moderate plaque without hemodynamically significant stenosis bilaterally. Holter ordered to evaluate arrhythmia with dizziness. Pending.  Patient presents for follow up to discuss HFrEF. Pt with h/o chronic systolic HF and significant CAD as above. Prior 2D ECHO 07/2022 with preserved EF >55%.  Prior Lexiscan  with Myoview 07/30/22 showed moderately LV  dysfunction LVEF 32%. Global hypokinesis. Moderate inferior and lateral scar of severe intensity without evidence of ischemia. Fixed defect. Reports tingling under left shoulder  blade that comes and goes, occurs at rest and with exertion, improves with activity. Denies chest pain or breakthrough indigestion on PPI. Reports some functional decline since pneumonia and sepsis in 08/2023. Did not note functional decline prior to that. He denies dyspnea, edema, weight gain, orthopnea, and palpitations.  Past Medical and Surgical History  Past Medical History Past Medical History:  Diagnosis Date  . AAA (abdominal aortic aneurysm) ()   . Atrial fibrillation (CMS/HHS-HCC)   . Barrett's esophagus   . Coronary artery disease   . Diabetes mellitus type 2, uncomplicated (CMS/HHS-HCC)   . DVT (deep venous thrombosis) (CMS/HHS-HCC)   . GERD (gastroesophageal reflux disease)   . Hiatal hernia   . Hypercholesterolemia   . Hyperplastic polyps of stomach   . Hypertension   . Shingles   . Thrombocytopenia ()   . Tobacco abuse     Past Surgical History He has a past surgical history that includes Coronary artery bypass graft; Cholecystectomy; Abdominal aortic aneurysm repair (07/2011); Angioplasty / stenting iliac; Cardiac catheterization; Coronary angioplasty; Colonoscopy (11/24/2009, 06/28/2004, 08/28/1998); egd (05/26/2002); egd (03/23/2012, 11/24/2009); Prostate surgery; Upper gastrointestinal endoscopy; egd (10/28/2016); Release of Dupuytren's contracture, left long finger (Left, 12/09/2018); Coronary angioplasty with stent; Colonoscopy (07/24/2020); and Release of Dupuytren's contracture, right little finger (Right, 10/18/2020).   Medications and Allergies  Current Medications  Current Outpatient Medications on File Prior to Visit  Medication Sig Dispense Refill  . atorvastatin  (LIPITOR ) 80 MG tablet Take 1 tablet (80 mg total) by mouth once daily 90 tablet 3  . cholecalciferol  (VITAMIN D3) 1000 unit tablet Take 2,000 Units by mouth once daily    . fluticasone  propionate (FLONASE ) 50 mcg/actuation nasal spray USE 2 SPRAYS IN BOTH NOSTRILS ONCE DAILY AS NEEDED 16 g 11  .  fluticasone -umeclidinium-vilanterol (TRELEGY ELLIPTA) 100-62.5-25 mcg inhaler Inhale 1 Puff into the lungs once daily 1 each 6  . gabapentin  (NEURONTIN ) 300 MG capsule TAKE 2 CAPSULES TWICE DAILY (MORNING AND EVENING) AND TAKE 1 CAPSULE MIDDAY 180 capsule 3  . isosorbide  mononitrate (IMDUR ) 60 MG ER tablet Take 1 tablet (60 mg total) by mouth once daily 90 tablet 3  . latanoprost  (XALATAN ) 0.005 % ophthalmic solution INSTILL ONE DROP IN EACH EYE AT BEDTIME    . metoprolol  SUCCinate (TOPROL -XL) 100 MG XL tablet TAKE 1 TABLET EVERY DAY 90 tablet 3  . nicotine (NICODERM CQ) 21 mg/24 hr patch Place 1 patch onto the skin daily 30 patch 1  . pantoprazole  (PROTONIX ) 40 MG DR tablet Take 1 tablet (40 mg total) by mouth once daily 90 tablet 1  . potassium chloride  (KLOR-CON  M20) 20 MEQ ER tablet Take 1 tablet (20 mEq total) by mouth once daily 90 tablet 3  . rivaroxaban  (XARELTO ) 20 mg tablet Take 1 tablet (20 mg total) by mouth once daily 90 tablet 3  . tiZANidine (ZANAFLEX) 2 MG tablet Take between 1mg  (half a tablet) and 4mg  (two tablets) up to twice daily as needed for musculoskeletal pain. 120 tablet 2  . albuterol  90 mcg/actuation inhaler Inhale 2 inhalations into the lungs every 6 (six) hours as needed for Wheezing (Patient not taking: Reported on 08/19/2023) 1 each 3  . bisacodyL  (DULCOLAX) 10 mg suppository Place 1 suppository (10 mg total) rectally once daily as needed for Constipation (Patient not taking: Reported on 05/19/2023) 5 suppository 0  . predniSONE  (  DELTASONE ) 20 MG tablet 3qam for 5 days, 2qam for 5 days, 1qam for 5 days (Patient not taking: Reported on 09/11/2023) 30 tablet 0   No current facility-administered medications on file prior to visit.    Allergies: Ace inhibitors, Crestor [rosuvastatin], Niacin, Penicillins, Pravastatin, Zocor [simvastatin], and Amoxicillin  Social and Family History  Social History  reports that he has been smoking cigarettes. He started smoking about 60  years ago. He has a 52.9 pack-year smoking history. He has been exposed to tobacco smoke. He has never used smokeless tobacco. He reports current alcohol  use of about 3.0 standard drinks of alcohol  per week. He reports that he does not use drugs.  Family History Family History  Problem Relation Name Age of Onset  . Diabetes type II Mother    . Cancer Mother    . Myocardial Infarction (Heart attack) Father      Review of Systems   Review of Systems  Constitutional:        Denies weight gain  Eyes:  Positive for blurred vision (chronic).  Respiratory:  Negative for shortness of breath.   Cardiovascular:  Negative for chest pain, palpitations, orthopnea and leg swelling.  Neurological:  Positive for dizziness (no presyncope). Negative for loss of consciousness.  Endo/Heme/Allergies:  Does not bruise/bleed easily.      Physical Examination   Vitals:BP 118/66   Pulse 74   Ht 182.9 cm (6')   Wt 92.5 kg (204 lb)   SpO2 97%   BMI 27.67 kg/m  Ht:182.9 cm (6') Wt:92.5 kg (204 lb) ADJ:Anib surface area is 2.17 meters squared. Body mass index is 27.67 kg/m.  Physical Exam Vitals reviewed.  Constitutional:      General: He is not in acute distress.    Appearance: Normal appearance.  Cardiovascular:     Rate and Rhythm: Normal rate and regular rhythm.     Heart sounds: Normal heart sounds. No murmur heard. Pulmonary:     Effort: Pulmonary effort is normal. No respiratory distress.     Breath sounds: Normal breath sounds.  Musculoskeletal:     Right lower leg: No edema.     Left lower leg: No edema.  Neurological:     Mental Status: He is alert.      Data & Results   Recent Labs    01/25/21 1015 02/01/22 0801 02/11/23 0838  CHOLTOTAL 139 136 137  HDL 64.4 52.3 60.4  LDLCALC 50 60 63  VLDL 25 23 13   TRIG 125 117 67    Recent Labs    01/25/21 1015 08/01/21 0830 02/01/22 0801 08/05/22 0833 02/11/23 0838 08/15/23 0934  NA 143   < > 144 141 143 143  K 4.0    < > 3.7 3.9 4.4 3.8  BUN 14   < > 14 13 13 18   CREATININE 0.8   < > 0.9 0.8 0.8 1.0  CO2 34.8*   < > 33.3* 28.4 29.0 28.4  GLUCOSE 120*   < > 103 101 111* 133*  ALT 37  --  28  --  29  --   AST 32  --  30  --  32  --   TBILI 1.6*  --  1.2  --  1.5*  --   ALB 4.5  --  4.6  --  4.7  --    < > = values in this interval not displayed.    Recent Labs    02/01/22 0801 05/08/22 0839 08/15/22 1231  02/11/23 0838 03/03/23 0956  WBC 8.2  --  9.7 7.4  --   HGB 15.5  --  15.7 16.1  --   HCT 48.6  --  47.9 49.2  --   MCV 91.4  --  94.3 93.5  --   PLT 130*   < > 149* 139* 137*   < > = values in this interval not displayed.    Recent Labs    10/31/21 0938 02/01/22 0801 08/05/22 0833 08/23/22 0957 02/11/23 0838 08/15/23 0934  TSH 1.647  --   --  1.932  --   --   HGBA1C  --    < > 6.1*  --  6.0* 6.3*   < > = values in this interval not displayed.      Assessment   78 y.o. male with  Encounter Diagnoses  Name Primary?  . PVD (peripheral vascular disease) ()   . Abdominal aortic aneurysm (AAA) without rupture, unspecified part ()   . Coronary artery disease involving coronary bypass graft of native heart without angina pectoris   . Chronic systolic HF (heart failure) (CMS/HHS-HCC) Yes  . Benign essential hypertension   . Paroxysmal atrial fibrillation (CMS/HHS-HCC)   . DVT of deep femoral vein, left (CMS/HHS-HCC)   . Aneurysm of ascending aorta without rupture ()   . OSA (obstructive sleep apnea)   . Moderate COPD (chronic obstructive pulmonary disease) (CMS/HHS-HCC)   . Mixed hyperlipidemia   . Type 2 diabetes mellitus without complication, without long-term current use of insulin (CMS/HHS-HCC)   . Tobacco abuse   . Bilateral carotid artery stenosis   . Abnormal nuclear stress test       Plan   Patient with new dizziness and more frequent episodes of cloudy vision. Chronic blurred vision. Follows with Ophthalmology for glaucoma and cataracts. Follows with Neurology for  intractable migraine and occipital neuralgia and has discussed dizziness with them as well. He is cutting down his tobacco use with Chantix. Cardiology w/u: Carotid US  without significant stenosis. Holter pending. ECHO with HFrEF, EF 30%.   - HFrEF and significant CAD history with Myoview showing scar. 2D ECHO with EF 30% (previously 55% 1 year ago) with global hypocontractility of LV wall. Last [facility] 01/19/2018 revealed 90% stenosis of D1. RCA has acute marginal with 99% stenosis. RCA had no significant disease. Vein graft to RCA is chronically occluded. Vein graft to diagonal is chronically occluded. Vein graft to OM has stent with proximal occlusion. LIMA to LAD is widely patent. Occluded proximal LCX. Occluded proximal to mid LAD. Medical management pursued. Not amenable to PCI or further coronary bypass grafting. EF 35-45%. Reviewed with Dr. Florencio. Proceed with cardiac MRI for viability evaluation. Proceed with PET CT Stress test for ischemic evaluation. Continue taking atorvastatin , isosorbide , metoprolol  XL, and Xarelto  for secondary ASCVD prevention. Pt asymptomatic and euvolemic on exam. Optimize GDMT for HFrEF: stop amlodipine  and losartan . Start Entresto and Jardiance. F/U in 2 weeks for labs and GDMT optimization. Discussed s/s of hypoglycemia and treatment with diet-controlled T2DM (last A1C 6.3%) and advised stopping Jardiance and contacting our office if this occurs. Continue low salt diet. Contact our office for any overnight wt gain of 2 lbs or 5 lbs in 1 week.   - For his PAF, he denies palpitations, but does report new dizziness. He takes metoprolol  XL for rate control and Xarelto  for stroke prevention with CHA2DS2-VASc Score 8. CrCl 111 mL/min. Denies significant bleeding. 72-hr Holter to evaluate for bradycardia and evaluate  PAF burden with new dizziness -results pending. NSR on auscultation today. HR 74.  - Continue following with Vascular Surgery for PVD. Has follow up in next months.  STAT carotid US  with worsening cloud vision and dizziness to r/o stenosis done at prior visit without e/o hemodynamically significant stenosis.   - Continue CPAP  Return in about 2 weeks (around 09/25/2023).  Attestation Statement:   I personally performed the service, non-incident to. [facility])   MARY TINNIE LAUNIE MAIDEN, NP

## 2023-09-12 ENCOUNTER — Other Ambulatory Visit: Payer: Self-pay | Admitting: Nurse Practitioner

## 2023-09-12 DIAGNOSIS — I5022 Chronic systolic (congestive) heart failure: Secondary | ICD-10-CM

## 2023-09-12 DIAGNOSIS — I25810 Atherosclerosis of coronary artery bypass graft(s) without angina pectoris: Secondary | ICD-10-CM

## 2023-09-12 DIAGNOSIS — R9439 Abnormal result of other cardiovascular function study: Secondary | ICD-10-CM

## 2023-09-12 DIAGNOSIS — I25729 Atherosclerosis of autologous artery coronary artery bypass graft(s) with unspecified angina pectoris: Secondary | ICD-10-CM

## 2023-09-15 ENCOUNTER — Ambulatory Visit
Admission: RE | Admit: 2023-09-15 | Discharge: 2023-09-15 | Disposition: A | Discharge status: Home / Self Care | Source: Ambulatory Visit | Attending: Nurse Practitioner | Admitting: Nurse Practitioner

## 2023-09-15 ENCOUNTER — Other Ambulatory Visit: Payer: Self-pay | Admitting: Nurse Practitioner

## 2023-09-15 DIAGNOSIS — R9439 Abnormal result of other cardiovascular function study: Secondary | ICD-10-CM | POA: Insufficient documentation

## 2023-09-15 DIAGNOSIS — I5022 Chronic systolic (congestive) heart failure: Secondary | ICD-10-CM | POA: Diagnosis not present

## 2023-09-15 DIAGNOSIS — I25729 Atherosclerosis of autologous artery coronary artery bypass graft(s) with unspecified angina pectoris: Secondary | ICD-10-CM | POA: Diagnosis not present

## 2023-09-15 DIAGNOSIS — R42 Dizziness and giddiness: Secondary | ICD-10-CM | POA: Diagnosis not present

## 2023-09-15 MED ORDER — GADOBUTROL 1 MMOL/ML IV SOLN
12.0000 mL | Freq: Once | INTRAVENOUS | Status: AC | PRN
Start: 2023-09-15 — End: 2023-09-15
  Administered 2023-09-15: 12 mL via INTRAVENOUS

## 2023-09-18 ENCOUNTER — Encounter
Admission: RE | Admit: 2023-09-18 | Discharge: 2023-09-18 | Disposition: A | Discharge status: Home / Self Care | Source: Ambulatory Visit | Attending: Nurse Practitioner | Admitting: Nurse Practitioner

## 2023-09-18 ENCOUNTER — Other Ambulatory Visit: Payer: Self-pay | Admitting: Cardiology

## 2023-09-18 DIAGNOSIS — I5022 Chronic systolic (congestive) heart failure: Secondary | ICD-10-CM | POA: Diagnosis not present

## 2023-09-18 DIAGNOSIS — R9439 Abnormal result of other cardiovascular function study: Secondary | ICD-10-CM | POA: Insufficient documentation

## 2023-09-18 DIAGNOSIS — I25810 Atherosclerosis of coronary artery bypass graft(s) without angina pectoris: Secondary | ICD-10-CM | POA: Diagnosis not present

## 2023-09-18 LAB — NM PET CT CARDIAC PERFUSION MULTI W/ABSOLUTE BLOODFLOW
Nuc Rest EF: 43 %
Nuc Stress EF: 42 %
Peak HR: 71 {beats}/min
Rest HR: 55 {beats}/min
Rest Nuclear Isotope Dose: 24.4 mCi
SRS: 14
SSS: 20
ST Depression (mm): 0 mm
Stress Nuclear Isotope Dose: 24.4 mCi
TID: 1.11

## 2023-09-18 MED ORDER — RUBIDIUM RB82 GENERATOR (RUBYFILL)
25.0000 | PACK | Freq: Once | INTRAVENOUS | Status: AC
  Administered 2023-09-18: 24.4 via INTRAVENOUS

## 2023-09-18 MED ORDER — RUBIDIUM RB82 GENERATOR (RUBYFILL)
25.0000 | PACK | Freq: Once | INTRAVENOUS | Status: AC
Start: 2023-09-18 — End: 2023-09-18
  Administered 2023-09-18: 24.39 via INTRAVENOUS

## 2023-09-18 MED ORDER — REGADENOSON 0.4 MG/5ML IV SOLN
0.4000 mg | Freq: Once | INTRAVENOUS | Status: AC
  Administered 2023-09-18: 0.4 mg via INTRAVENOUS
  Filled 2023-09-18: qty 5

## 2023-09-18 MED ORDER — REGADENOSON 0.4 MG/5ML IV SOLN
INTRAVENOUS | Status: AC
  Filled 2023-09-18: qty 5

## 2023-09-18 NOTE — Progress Notes (Signed)
 Orders only for Cardiac PET stress test.   Danita Bloch, PA-C

## 2023-09-19 ENCOUNTER — Encounter: Payer: Self-pay | Admitting: Oncology

## 2023-09-22 ENCOUNTER — Ambulatory Visit: Admitting: Oncology

## 2023-09-23 DIAGNOSIS — G4733 Obstructive sleep apnea (adult) (pediatric): Secondary | ICD-10-CM | POA: Diagnosis not present

## 2023-09-23 DIAGNOSIS — I5022 Chronic systolic (congestive) heart failure: Secondary | ICD-10-CM | POA: Diagnosis not present

## 2023-09-23 DIAGNOSIS — E782 Mixed hyperlipidemia: Secondary | ICD-10-CM | POA: Diagnosis not present

## 2023-09-23 DIAGNOSIS — Z720 Tobacco use: Secondary | ICD-10-CM | POA: Diagnosis not present

## 2023-09-23 DIAGNOSIS — I714 Abdominal aortic aneurysm, without rupture, unspecified: Secondary | ICD-10-CM | POA: Diagnosis not present

## 2023-09-23 DIAGNOSIS — I25810 Atherosclerosis of coronary artery bypass graft(s) without angina pectoris: Secondary | ICD-10-CM | POA: Diagnosis not present

## 2023-09-23 DIAGNOSIS — D6861 Antiphospholipid syndrome: Secondary | ICD-10-CM | POA: Diagnosis not present

## 2023-09-23 DIAGNOSIS — I739 Peripheral vascular disease, unspecified: Secondary | ICD-10-CM | POA: Diagnosis not present

## 2023-09-23 DIAGNOSIS — I10 Essential (primary) hypertension: Secondary | ICD-10-CM | POA: Diagnosis not present

## 2023-09-26 ENCOUNTER — Encounter (INDEPENDENT_AMBULATORY_CARE_PROVIDER_SITE_OTHER): Payer: Medicare HMO

## 2023-09-26 ENCOUNTER — Other Ambulatory Visit (INDEPENDENT_AMBULATORY_CARE_PROVIDER_SITE_OTHER): Payer: Self-pay | Admitting: Vascular Surgery

## 2023-09-26 ENCOUNTER — Ambulatory Visit (INDEPENDENT_AMBULATORY_CARE_PROVIDER_SITE_OTHER): Payer: Medicare HMO | Admitting: Vascular Surgery

## 2023-09-26 ENCOUNTER — Other Ambulatory Visit (INDEPENDENT_AMBULATORY_CARE_PROVIDER_SITE_OTHER): Payer: Medicare HMO

## 2023-09-26 ENCOUNTER — Other Ambulatory Visit: Payer: Self-pay | Admitting: Specialist

## 2023-09-26 DIAGNOSIS — F17210 Nicotine dependence, cigarettes, uncomplicated: Secondary | ICD-10-CM

## 2023-09-26 DIAGNOSIS — I739 Peripheral vascular disease, unspecified: Secondary | ICD-10-CM

## 2023-09-26 DIAGNOSIS — I714 Abdominal aortic aneurysm, without rupture, unspecified: Secondary | ICD-10-CM

## 2023-09-29 ENCOUNTER — Ambulatory Visit (INDEPENDENT_AMBULATORY_CARE_PROVIDER_SITE_OTHER)

## 2023-09-29 DIAGNOSIS — I739 Peripheral vascular disease, unspecified: Secondary | ICD-10-CM | POA: Diagnosis not present

## 2023-09-29 DIAGNOSIS — I714 Abdominal aortic aneurysm, without rupture, unspecified: Secondary | ICD-10-CM

## 2023-10-02 ENCOUNTER — Ambulatory Visit

## 2023-10-03 ENCOUNTER — Ambulatory Visit (INDEPENDENT_AMBULATORY_CARE_PROVIDER_SITE_OTHER): Admitting: Vascular Surgery

## 2023-10-03 ENCOUNTER — Encounter (INDEPENDENT_AMBULATORY_CARE_PROVIDER_SITE_OTHER): Payer: Self-pay | Admitting: Vascular Surgery

## 2023-10-03 VITALS — BP 136/76 | HR 60 | Wt 201.4 lb

## 2023-10-03 DIAGNOSIS — I7143 Infrarenal abdominal aortic aneurysm, without rupture: Secondary | ICD-10-CM | POA: Diagnosis not present

## 2023-10-03 DIAGNOSIS — I739 Peripheral vascular disease, unspecified: Secondary | ICD-10-CM

## 2023-10-03 DIAGNOSIS — E1151 Type 2 diabetes mellitus with diabetic peripheral angiopathy without gangrene: Secondary | ICD-10-CM | POA: Diagnosis not present

## 2023-10-03 DIAGNOSIS — I10 Essential (primary) hypertension: Secondary | ICD-10-CM

## 2023-10-03 LAB — VAS US ABI WITH/WO TBI
Left ABI: >1
Right ABI: >1

## 2023-10-03 NOTE — Progress Notes (Signed)
 MRN : [id number]  Martin Adkins is a 78 y.o. (1945/12/07) male who presents with chief complaint of  Chief Complaint  Patient presents with   Follow-up  .  History of Present Illness: Patient returns today in follow up of his AAA and PAD. He is doing well.  No lifestyle limiting claudication, rest pain or ulceration. No aneurysm related symptoms. Specifically, the patient denies new back or abdominal pain, or signs of peripheral embolization. Several years s/p endovascular repair of his AAA. Duplex recently shows a patent stent graft without endoleak.  The aortic sac diameter is stable at 4.1 cm.    Current Outpatient Medications  Medication Sig Dispense Refill   acetaminophen  (TYLENOL ) 325 MG tablet Take 2 tablets (650 mg total) by mouth every 6 (six) hours as needed for mild pain (pain score 1-3) or fever (or Fever >/= 101). 10 tablet 0   albuterol  (VENTOLIN  HFA) 108 (90 Base) MCG/ACT inhaler SMARTSIG:2 inhalation Via Inhaler Every 6 Hours PRN (Patient not taking: Reported on 08/09/2023)     [Paused] amLODipine  (NORVASC ) 10 MG tablet Take 10 mg by mouth daily.      atorvastatin  (LIPITOR ) 80 MG tablet Take 80 mg by mouth daily.   11   Cholecalciferol  (VITAMIN D3) 50 MCG (2000 UT) CAPS Take 2,000 Units/day by mouth daily.     DULoxetine (CYMBALTA) 30 MG capsule Take by mouth. (Patient not taking: Reported on 08/09/2023)     fluticasone  (FLONASE ) 50 MCG/ACT nasal spray Place 2 sprays into both nostrils daily as needed for allergies.     gabapentin  (NEURONTIN ) 300 MG capsule Take 1 capsule (300 mg total) by mouth 3 (three) times daily. (Patient taking differently: Take 600 mg by mouth 2 (two) times daily.) 90 capsule 0   guaiFENesin  (ROBITUSSIN) 100 MG/5ML liquid Take 5 mLs by mouth every 4 (four) hours as needed for cough or to loosen phlegm. 120 mL 0   [Paused] isosorbide  mononitrate (IMDUR ) 60 MG 24 hr tablet Take 1 tablet (60 mg total) by mouth daily. 30 tablet 0   latanoprost  (XALATAN )  0.005 % ophthalmic solution Place 1 drop into both eyes at bedtime.      [Paused] losartan  (COZAAR ) 25 MG tablet Take 25 mg by mouth daily.     methocarbamol  (ROBAXIN ) 500 MG tablet Take by mouth. (Patient not taking: Reported on 08/09/2023)     metoprolol  succinate (TOPROL -XL) 100 MG 24 hr tablet Take 0.5 tablets (50 mg total) by mouth daily. 30 tablet 0   Multiple Vitamins-Minerals (MULTIVITAMIN MEN 50+ PO) Take 1 tablet by mouth daily.     pantoprazole  (PROTONIX ) 40 MG tablet Take 1 tablet (40 mg total) by mouth 2 (two) times daily before a meal. (Patient not taking: Reported on 08/09/2023) 60 tablet 6   pantoprazole  (PROTONIX ) 40 MG tablet Take 40 mg by mouth daily.     potassium chloride  SA (K-DUR,KLOR-CON ) 20 MEQ tablet Take 20 mEq by mouth daily.      rivaroxaban  (XARELTO ) 20 MG TABS tablet Take 1 tablet (20 mg total) by mouth daily with supper.     tiZANidine (ZANAFLEX) 2 MG tablet Take between 1mg  (half a tablet) and 4mg  (two tablets) up to twice daily as needed for musculoskeletal pain.     TRELEGY ELLIPTA 100-62.5-25 MCG/ACT AEPB Inhale 1 puff into the lungs daily.     No current facility-administered medications for this visit.    Past Medical History:  Diagnosis Date   AAA (abdominal aortic aneurysm) without  rupture    a.) US  02/09/04 - 3.1 x 3.4 cm. b.) US  02/14/05 - 3.62 x 3.63 cm. c.) US  02/25/06 - 3.97 x 4.0 cm. d.) CT 07/09/11 - 4.9 cm. e.) s/p EVAR 2013. f.) enlarging AAA --> back to the OR on 09/24/2017 for placement of a 27 mm x 12 cm RIGHT iliac extension limb down to distal CIA.   Aortic atherosclerosis    APS (antiphospholipid syndrome)    Arthritis    Atrial fibrillation (HCC)    a.) CHA2DS2-VASc Score = 6 (age, CHF, HTN, prior DVT x 2, prior MI). b.) on rivaroxaban    B12 deficiency 03/21/2017   Barrett's esophagus    BPH (benign prostatic hyperplasia)    CAD (coronary artery disease)    a.) LHC 11/14/95 --> 95% mLCx, 95% OM1; PTCA. b.) 4v CABG 2004 --> LIMA-LAD,  SVG-D1,RCA,OM. c.) NSTEMI 05/02/2013 --> PCI with DES x 3 to SVG-OM in 2 distinct areas with filter. d.) Inf STEMI 07/30/14 - LHC --> 50% LM, 90% mLCx, 100% OM2, 100% RPDA; LIMA-LAD patent; 100% occ of SVG OM2,OM3,PDA; med mgmt. e.) LHC 01/19/18 - patent LIMA-LAD; other grafts occ; not amenable to PCI/further bypass.   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 LV systolic dysfunction; LVEF 35-40%.   History of hiatal hernia    History of shingles 2013   Hyperlipemia    Hyperplastic polyps of stomach    a.) Bx on 07/24/2020 --> well differentiated NET; WHO grade I.   Hypertension    NSTEMI (non-ST elevated myocardial infarction) (HCC) 05/02/2013   a.) LHC 05/03/2013 --> 50% LM, 100% LAD, 75% mLCx, 100% dLCx, 60% mRCA; patient LIMA-LAD, occluded SVG-D1 and SVG-PDA. SVG-OM with extensive thrombus and distal 99% lesion. HIGH RISK PCI performed at [facility] with placement of DES x 3 to 2 distinct areas of SVG with filter protection.   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 No intervention; medical management.   Valvular regurgitation    a.) TTE 02/22/2020 --> LVEF 35-40%; mild panvalvular.     Past Surgical History:  Procedure Laterality Date   ABDOMINAL AORTA STENT     BIOPSY  11/05/2021   Procedure: BIOPSY;  Surgeon: Wilhelmenia Aloha Raddle., MD;  Location: [facility];  Service: Gastroenterology;;   CHOLECYSTECTOMY     COLONOSCOPY  11/24/2009   COLONOSCOPY N/A 10/09/2020   Procedure: COLONOSCOPY;  Surgeon: Wilhelmenia Aloha Raddle., MD;  Location: [facility];  Service: Gastroenterology;  Laterality: N/A;   COLONOSCOPY WITH PROPOFOL  N/A 07/24/2020   Procedure: COLONOSCOPY WITH PROPOFOL ;  Surgeon: Aundria, Teodoro K, MD;  Location:  [facility];  Service: Gastroenterology;  Laterality: N/A;   CORONARY ANGIOPLASTY WITH STENT PLACEMENT Left 05/03/2013   Procedure: CORONARY ANGIOPLASTY WITH STENT PLACEMENT (DES x 3 to SVG-OM graft); Location: [facility]   CORONARY ARTERY BYPASS GRAFT N/A 01/21/2002   Procedure: 4V CABG (LIMA-LAD, SVG-D1, SVG-RCA, SVG-OM); Location: [facility]   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   Procedure: DUPUYTREN CONTRACTURE RELEASE LEFT LONG FINGER;  Surgeon: Edie Norleen PARAS, MD;  Location: [facility];  Service: Orthopedics;  Laterality: Left;   DUPUYTREN CONTRACTURE RELEASE Right 10/18/2020   Procedure: DUPUYTREN CONTRACTURE RELEASE;  Surgeon: Edie Norleen PARAS, MD;  Location: [facility];  Service: Orthopedics;  Laterality: Right;   ENDOVASCULAR STENT GRAFT (AAA) N/A 09/24/2017   Procedure: ENDOVASCULAR REPAIR/STENT GRAFT (27 mm x 12 cm RIGHT iliac limb extension to distal CIA);  Surgeon: Marea Selinda RAMAN, MD;  Location: [facility];  Service: Cardiovascular;  Laterality: N/A;   ESOPHAGOGASTRODUODENOSCOPY  05/26/02  03/23/12   ESOPHAGOGASTRODUODENOSCOPY (EGD) WITH PROPOFOL  N/A 10/28/2016   Procedure: ESOPHAGOGASTRODUODENOSCOPY (EGD) WITH PROPOFOL ;  Surgeon: Viktoria Lamar DASEN, MD;  Location: [facility];  Service: Endoscopy;  Laterality: N/A;   ESOPHAGOGASTRODUODENOSCOPY (EGD) WITH PROPOFOL  N/A 11/05/2021   Procedure: ESOPHAGOGASTRODUODENOSCOPY (EGD) WITH  PROPOFOL ;  Surgeon: Wilhelmenia Aloha Raddle., MD;  Location: [facility];  Service: Gastroenterology;  Laterality: N/A;   EUS N/A 10/09/2020   Procedure: LOWER ENDOSCOPIC ULTRASOUND (EUS);  Surgeon: Wilhelmenia Aloha Raddle., MD;  Location: [facility];  Service: Gastroenterology;  Laterality: N/A;   EUS N/A 11/05/2021   Procedure: LOWER ENDOSCOPIC ULTRASOUND (EUS);  Surgeon: Wilhelmenia Aloha Raddle., MD;  Location: [facility];  Service: Gastroenterology;  Laterality: N/A;   EUS N/A 11/11/2022   Procedure: LOWER ENDOSCOPIC ULTRASOUND (EUS);  Surgeon: Wilhelmenia Aloha Raddle., MD;  Location: [facility];  Service: Gastroenterology;  Laterality: N/A;   FLEXIBLE SIGMOIDOSCOPY N/A 11/05/2021   Procedure: FLEXIBLE SIGMOIDOSCOPY;  Surgeon: Wilhelmenia Aloha Raddle., MD;  Location: [facility];  Service: Gastroenterology;  Laterality: N/A;   FLEXIBLE SIGMOIDOSCOPY N/A 11/11/2022   Procedure: FLEXIBLE SIGMOIDOSCOPY;  Surgeon: Wilhelmenia Aloha Raddle., MD;  Location: [facility];  Service: Gastroenterology;  Laterality: N/A;   HEMOSTASIS CLIP PLACEMENT  11/05/2021   Procedure: HEMOSTASIS CLIP PLACEMENT;  Surgeon: Wilhelmenia Aloha Raddle., MD;  Location: [facility];  Service: Gastroenterology;;   INCISION AND DRAINAGE ABSCESS N/A 06/14/2020   Procedure: INCISION AND DRAINAGE ABSCESS;  Surgeon: Rodolph Romano, MD;  Location: [facility];  Service: General;  Laterality: N/A;   INGUINAL HERNIA REPAIR Left    LEFT HEART CATH AND CORONARY ANGIOGRAPHY N/A 01/19/2018   Procedure: LEFT HEART CATH AND CORONARY ANGIOGRAPHY;  Surgeon: Bosie Vinie LABOR, MD;  Location: [facility];  Service: Cardiovascular;  Laterality: N/A;   LEFT HEART CATH AND CORONARY ANGIOGRAPHY Left 11/14/1995   Procedure: CARDIAC CATHETERIZATION (PTCA); Location: [facility]; Surgeon: Miquel Ellen, MD   LEFT HEART CATH AND CORS/GRAFTS ANGIOGRAPHY Left 07/30/2014   Procedure: CARDIAC CATHETERIZATION; Location: [facility]   PERIPHERAL VASCULAR  CATHETERIZATION N/A 09/06/2014   Procedure: IVC Filter Removal;  Surgeon: Cordella KANDICE Shawl, MD;  Location: [facility];  Service: Cardiovascular;  Laterality: N/A;   POLYPECTOMY  10/09/2020   Procedure: POLYPECTOMY;  Surgeon: Wilhelmenia Aloha Raddle., MD;  Location: [facility];  Service: Gastroenterology;;   POLYPECTOMY  11/05/2021   Procedure: POLYPECTOMY;  Surgeon: Wilhelmenia Aloha Raddle., MD;  Location: [facility];  Service: Gastroenterology;;   POLYPECTOMY  11/11/2022   Procedure: POLYPECTOMY;  Surgeon: Wilhelmenia Aloha Raddle., MD;  Location: [facility];  Service: Gastroenterology;;   ROBLEY LIFTING INJECTION  10/09/2020   Procedure: SUBMUCOSAL LIFTING INJECTION;  Surgeon: Wilhelmenia Aloha Raddle., MD;  Location: [facility];  Service: Gastroenterology;;   SUBMUCOSAL TATTOO INJECTION  11/05/2021   Procedure: SUBMUCOSAL TATTOO INJECTION;  Surgeon: Wilhelmenia Aloha Raddle., MD;  Location: [facility];  Service: Gastroenterology;;   TRANSURETHRAL RESECTION OF PROSTATE N/A 02/20/2015   Procedure: TRANSURETHRAL RESECTION OF THE PROSTATE (TURP);  Surgeon: Rosina Riis, MD;  Location: [facility];  Service: Urology;  Laterality: N/A;     Social History   Tobacco Use   Smoking status: Some Days    Current packs/day: 0.01    Average packs/day: (0.2 ttl pk-yrs)    Types: Cigarettes   Smokeless tobacco: Never   Tobacco comments:    sometimes 2-3 a day  Vaping Use   Vaping status: Never Used  Substance Use Topics   Alcohol  use: Yes    Alcohol /week: 1.0 - 2.0 standard drink of alcohol     Types: 1 - 2 Shots of liquor per week    Comment: casual   Drug use: No       Family History  Problem Relation Age of Onset   Cancer Mother 83       BREAST   Diabetes Mother    Coronary artery disease Father    Heart attack Father    Prostate cancer Brother    Bladder Cancer Neg Hx    Kidney cancer Neg Hx      Allergies  Allergen Reactions   Ace Inhibitors Other (See  Comments)    Other reaction(s): Cough   Amoxicillin Rash    Has patient had a PCN reaction causing immediate rash, facial/tongue/throat swelling, SOB or lightheadedness with hypotension: Yes Has patient had a PCN reaction causing severe rash involving mucus membranes or skin necrosis: Unknown Has patient had a PCN reaction that required hospitalization: Unknown Has patient had a PCN reaction occurring within the last 10 years: No If all of the above answers are NO, then may proceed with Cephalosporin use.   Niacin Rash   Penicillins Rash    Has patient had a PCN reaction causing immediate rash, facial/tongue/throat swelling, SOB or lightheadedness with hypotension: Yes Has patient had a PCN reaction causing severe rash involving mucus membranes or skin necrosis: Unknown Has patient had a PCN reaction that required hospitalization: Unknown Has patient had a PCN reaction occurring within the last 10 years: No If all of the above answers are NO, then may proceed with Cephalosporin use.   Pravastatin Other (See Comments)    Other reaction(s): Muscle Pain   Rosuvastatin Other (See Comments)    Other reaction(s): Other (See Comments) GI bleed   Simvastatin Other (See Comments)    Other reaction(s): Muscle Pain     REVIEW OF SYSTEMS (Negative unless checked)   Constitutional: [] Weight loss  [] Fever  [] Chills Cardiac: [] Chest pain   [] Chest pressure   [] Palpitations   [] Shortness of breath when laying flat   [] Shortness of breath at rest   [] Shortness of breath with exertion. Vascular:  [x] Pain in legs with walking   [x] Pain in legs at rest   [] Pain in legs when laying flat   [x] Claudication   [] Pain in feet when walking  [] Pain in feet at rest  [] Pain in feet when laying flat   [] History of DVT   [] Phlebitis   [] Swelling in legs   [] Varicose veins   [] Non-healing ulcers Pulmonary:   [] Uses home oxygen   [] Productive  cough   [] Hemoptysis   [] Wheeze  [] COPD   [] Asthma Neurologic:   [] Dizziness  [] Blackouts   [] Seizures   [] History of stroke   [] History of TIA  [] Aphasia   [] Temporary blindness   [] Dysphagia   [] Weakness or numbness in arms   [] Weakness or numbness in legs Musculoskeletal:  [x] Arthritis   [] Joint swelling   [x] Joint pain   [] Low back pain Hematologic:  [] Easy bruising  [] Easy bleeding   [] Hypercoagulable state   [] Anemic   Gastrointestinal:  [] Blood in stool   [] Vomiting blood  [] Gastroesophageal reflux/heartburn   [] Abdominal pain Genitourinary:  [] Chronic kidney disease   [] Difficult urination  [] Frequent urination  [] Burning with urination   [] Hematuria Skin:  [] Rashes   [] Ulcers   [] Wounds Psychological:  [] History of anxiety   []  History of major depression.  Physical Examination  BP 136/76   Pulse 60   Wt 201 lb 6 oz (91.3 kg)   BMI 27.31 kg/m  Gen:  WD/WN, NAD Head: [place]/AT, No temporalis wasting. Ear/Nose/Throat: Hearing grossly intact, nares w/o erythema or drainage Eyes: Conjunctiva clear. Sclera non-icteric Neck: Supple.  Trachea midline Pulmonary:  Good air movement, no use of accessory muscles.  Cardiac: RRR, no JVD Vascular:  Vessel Right Left  Radial Palpable Palpable                          PT Palpable Palpable  DP Palpable Palpable   Gastrointestinal: soft, non-tender/non-distended. No guarding/reflex.  Musculoskeletal: M/S 5/5 throughout.  No deformity or atrophy. No edema. Neurologic: Sensation grossly intact in extremities.  Symmetrical.  Speech is fluent.  Psychiatric: Judgment intact, Mood & affect appropriate for pt's clinical situation. Dermatologic: No rashes or ulcers noted.  No cellulitis or open wounds.      Labs Recent Results (from the past 2160 hours)  Basic metabolic panel     Status: Abnormal   Collection Time: 08/09/23  5:33 PM  Result Value Ref Range   Sodium 137 135 - 145 mmol/L   Potassium 3.7 3.5 - 5.1 mmol/L   Chloride 106 98 - 111 mmol/L   CO2 21 (L) 22 - 32 mmol/L   Glucose, Bld 199 (H)  70 - 99 mg/dL    Comment: Glucose reference range applies only to samples taken after fasting for at least 8 hours.   BUN 21 8 - 23 mg/dL   Creatinine, Ser 8.69 (H) 0.61 - 1.24 mg/dL   Calcium  8.7 (L) 8.9 - 10.3 mg/dL   GFR, Estimated 57 (L) >60 mL/min    Comment: (NOTE) Calculated using the CKD-EPI Creatinine Equation (2021)    Anion gap 10 5 - 15    Comment: Performed at [facility], [address] [place], [place] [address]  CBC     Status: Abnormal   Collection Time: 08/09/23  5:33 PM  Result Value Ref Range   WBC 12.8 (H) 4.0 - 10.5 K/uL   RBC 4.69 4.22 - 5.81 MIL/uL   Hemoglobin 14.5 13.0 - 17.0 g/dL   HCT 56.3 60.9 - 47.9 %   MCV 93.0 80.0 - 100.0 fL   MCH 30.9 26.0 - 34.0 pg   MCHC 33.3 30.0 - 36.0 g/dL   RDW 83.8 (H) 88.4 - 84.4 %   Platelets 123 (L) 150 - 400 K/uL   nRBC 0.0 0.0 - 0.2 %    Comment: Performed at [facility], [address] [place], [place] [address]  BNP (Order  if Patient has history of Heart Failure)     Status: Abnormal   Collection Time: 08/09/23  5:33 PM  Result Value Ref Range   B Natriuretic Peptide 815.2 (H) 0.0 - 100.0 pg/mL    Comment: Performed at [facility], [address] [place], [place] [address]  Resp panel by RT-PCR (RSV, Flu A&B, Covid) Anterior Nasal Swab     Status: None   Collection Time: 08/09/23  5:33 PM   Specimen: Anterior Nasal Swab  Result Value Ref Range   SARS Coronavirus 2 by RT PCR NEGATIVE NEGATIVE    Comment: (NOTE) SARS-CoV-2 target nucleic acids are NOT DETECTED.  The SARS-CoV-2 RNA is generally detectable in upper respiratory specimens during the acute phase of infection. The lowest concentration of SARS-CoV-2 viral copies this assay can detect is 138 copies/mL. A negative result does not preclude SARS-Cov-2 infection and should not be used as the sole basis for treatment or other patient management decisions. A negative result may occur with  improper specimen  collection/handling, submission of specimen other than nasopharyngeal swab, presence of viral mutation(s) within the areas targeted by this assay, and inadequate number of viral copies(<138 copies/mL). A negative result must be combined with clinical observations, patient history, and epidemiological information. The expected result is Negative.  Fact Sheet for Patients:  BloggerCourse.com  Fact Sheet for Healthcare Providers:  SeriousBroker.it  This test is no t yet approved or cleared by the [place]  FDA and  has been authorized for detection and/or diagnosis of SARS-CoV-2 by FDA under an Emergency Use Authorization (EUA). This EUA will remain  in effect (meaning this test can be used) for the duration of the COVID-19 declaration under Section 564(b)(1) of the Act, 21 U.S.C.section 360bbb-3(b)(1), unless the authorization is terminated  or revoked sooner.       Influenza A by PCR NEGATIVE NEGATIVE   Influenza B by PCR NEGATIVE NEGATIVE    Comment: (NOTE) The Xpert Xpress SARS-CoV-2/FLU/RSV plus assay is intended as an aid in the diagnosis of influenza from Nasopharyngeal swab specimens and should not be used as a sole basis for treatment. Nasal washings and aspirates are unacceptable for Xpert Xpress SARS-CoV-2/FLU/RSV testing.  Fact Sheet for Patients: BloggerCourse.com  Fact Sheet for Healthcare Providers: SeriousBroker.it  This test is not yet approved or cleared by the [place]  FDA and has been authorized for detection and/or diagnosis of SARS-CoV-2 by FDA under an Emergency Use Authorization (EUA). This EUA will remain in effect (meaning this test can be used) for the duration of the COVID-19 declaration under Section 564(b)(1) of the Act, 21 U.S.C. section 360bbb-3(b)(1), unless the authorization is terminated or revoked.     Resp Syncytial Virus by PCR  NEGATIVE NEGATIVE    Comment: (NOTE) Fact Sheet for Patients: BloggerCourse.com  Fact Sheet for Healthcare Providers: SeriousBroker.it  This test is not yet approved or cleared by the [place]  FDA and has been authorized for detection and/or diagnosis of SARS-CoV-2 by FDA under an Emergency Use Authorization (EUA). This EUA will remain in effect (meaning this test can be used) for the duration of the COVID-19 declaration under Section 564(b)(1) of the Act, 21 U.S.C. section 360bbb-3(b)(1), unless the authorization is terminated or revoked.  Performed at [facility], [address] [place], [place] [address]   Troponin I (High Sensitivity)     Status: Abnormal   Collection Time: 08/09/23  5:33 PM  Result Value Ref Range   Troponin I (High Sensitivity) 32 (H) <  18 ng/L    Comment: (NOTE) Elevated high sensitivity troponin I (hsTnI) values and significant  changes across serial measurements may suggest ACS but many other  chronic and acute conditions are known to elevate hsTnI results.  Refer to the Links section for chest pain algorithms and additional  guidance. Performed at [facility], [address] [place], [place] [address]   Hepatic function panel     Status: Abnormal   Collection Time: 08/09/23  5:33 PM  Result Value Ref Range   Total Protein 6.7 6.5 - 8.1 g/dL   Albumin 3.5 3.5 - 5.0 g/dL   AST 28 15 - 41 U/L   ALT 15 0 - 44 U/L   Alkaline Phosphatase 69 38 - 126 U/L   Total Bilirubin 3.1 (H) 0.0 - 1.2 mg/dL   Bilirubin, Direct 0.6 (H) 0.0 - 0.2 mg/dL   Indirect Bilirubin 2.5 (H) 0.3 - 0.9 mg/dL    Comment: Performed at [facility], [address] [place], [place] [address]  Lactic acid, plasma     Status: None   Collection Time: 08/09/23  7:00 PM  Result Value Ref Range   Lactic Acid, Venous 1.5 0.5 - 1.9 mmol/L    Comment: Performed at [facility], [address] [place], [place] [address]  Culture, blood (routine x 2)     Status: None   Collection Time: 08/09/23  7:00 PM   Specimen: BLOOD  Result Value Ref Range   Specimen Description BLOOD BLOOD RIGHT HAND    Special Requests      BOTTLES DRAWN AEROBIC AND ANAEROBIC Blood Culture results may not be optimal due to an excessive volume of blood received in culture bottles   Culture      NO GROWTH 5 DAYS Performed at [facility], [address] [place], [place] [address]    Report Status 08/14/2023 FINAL   Culture, blood (routine x 2)     Status: None   Collection Time: 08/09/23  7:00 PM   Specimen: BLOOD  Result Value Ref Range   Specimen Description BLOOD BLOOD RIGHT ARM    Special Requests      BOTTLES DRAWN AEROBIC AND ANAEROBIC Blood Culture results may not be optimal due to an excessive volume of blood received in culture bottles   Culture      NO GROWTH 5 DAYS Performed at [facility], [address] [place], [place] [address]    Report Status 08/14/2023 FINAL   Blood gas, arterial ([facility] ONLY)     Status: Abnormal   Collection Time: 08/09/23  9:41 PM  Result Value Ref Range   pH, Arterial 7.44 7.35 - 7.45   pCO2 arterial 29 (L) 32 - 48 mmHg   pO2, Arterial 71 (L) 83 - 108 mmHg   Bicarbonate 19.7 (L) 20.0 - 28.0 mmol/L   Acid-base deficit 3.2 (H) 0.0 - 2.0 mmol/L   O2 Saturation 97.5 %   Patient temperature 37.0    Collection site RIGHT RADIAL    Allens test (pass/fail) PASS PASS    Comment: Performed at [facility], [address] [place], [place] [address]  Protime-INR     Status: Abnormal   Collection Time: 08/10/23  4:55 AM  Result Value Ref Range   Prothrombin Time 22.8 (H) 11.4 - 15.2 seconds   INR 1.9 (H) 0.8 - 1.2    Comment: (NOTE) INR goal varies based on device and disease states. Performed  at [facility], [address] [place], [place] [address]   Cortisol-am, blood     Status: None   Collection Time:  08/10/23  4:55 AM  Result Value Ref Range   Cortisol - AM 14.4 6.7 - 22.6 ug/dL    Comment: Performed at [facility], [address] [place], [place] [address]  Basic metabolic panel     Status: Abnormal   Collection Time: 08/10/23  4:55 AM  Result Value Ref Range   Sodium 138 135 - 145 mmol/L   Potassium 3.6 3.5 - 5.1 mmol/L   Chloride 105 98 - 111 mmol/L   CO2 24 22 - 32 mmol/L   Glucose, Bld 120 (H) 70 - 99 mg/dL    Comment: Glucose reference range applies only to samples taken after fasting for at least 8 hours.   BUN 19 8 - 23 mg/dL   Creatinine, Ser 8.83 0.61 - 1.24 mg/dL   Calcium  8.0 (L) 8.9 - 10.3 mg/dL   GFR, Estimated >39 >39 mL/min    Comment: (NOTE) Calculated using the CKD-EPI Creatinine Equation (2021)    Anion gap 9 5 - 15    Comment: Performed at [facility], [address] [place], [place] [address]  CBC     Status: Abnormal   Collection Time: 08/10/23  4:55 AM  Result Value Ref Range   WBC 10.9 (H) 4.0 - 10.5 K/uL   RBC 4.08 (L) 4.22 - 5.81 MIL/uL   Hemoglobin 12.5 (L) 13.0 - 17.0 g/dL   HCT 61.4 (L) 60.9 - 47.9 %   MCV 94.4 80.0 - 100.0 fL   MCH 30.6 26.0 - 34.0 pg   MCHC 32.5 30.0 - 36.0 g/dL   RDW 83.6 (H) 88.4 - 84.4 %   Platelets 102 (L) 150 - 400 K/uL   nRBC 0.0 0.0 - 0.2 %    Comment: Performed at [facility], [address] [place], [place] [address]  Procalcitonin     Status: None   Collection Time: 08/10/23  4:55 AM  Result Value Ref Range   Procalcitonin 2.31 ng/mL    Comment:        Interpretation: PCT > 2 ng/mL: Systemic infection (sepsis) is likely, unless other causes are known. (NOTE)       Sepsis PCT Algorithm           Lower Respiratory Tract                                      Infection PCT Algorithm    ----------------------------     ----------------------------         PCT < 0.25 ng/mL                PCT < 0.10 ng/mL          Strongly encourage             Strongly discourage    discontinuation of antibiotics    initiation of antibiotics    ----------------------------     -----------------------------       PCT 0.25 - 0.50 ng/mL            PCT 0.10 - 0.25 ng/mL               OR       >80% decrease in PCT  Discourage initiation of                                            antibiotics      Encourage discontinuation           of antibiotics    ----------------------------     -----------------------------         PCT >= 0.50 ng/mL              PCT 0.26 - 0.50 ng/mL               AND       <80% decrease in PCT              Encourage initiation of                                             antibiotics       Encourage continuation           of antibiotics    ----------------------------     -----------------------------        PCT >= 0.50 ng/mL                  PCT > 0.50 ng/mL               AND         increase in PCT                  Strongly encourage                                      initiation of antibiotics    Strongly encourage escalation           of antibiotics                                     -----------------------------                                           PCT <= 0.25 ng/mL                                                 OR                                        > 80% decrease in PCT                                      Discontinue / Do not initiate  antibiotics  Performed at [facility], [address] [place], [place] [address]   Strep pneumoniae urinary antigen     Status: None   Collection Time: 08/10/23  9:12 AM  Result Value Ref Range   Strep Pneumo Urinary Antigen NEGATIVE NEGATIVE    Comment:        Infection due to S. pneumoniae cannot be absolutely ruled out since the antigen present may be below the detection limit of the test. Performed at [facility], [address] [place], [place] [address]   Respiratory (~20 pathogens) panel by PCR      Status: None   Collection Time: 08/10/23  4:20 PM   Specimen: Nasopharyngeal Swab; Respiratory  Result Value Ref Range   Adenovirus NOT DETECTED NOT DETECTED   Coronavirus 229E NOT DETECTED NOT DETECTED    Comment: (NOTE) The Coronavirus on the Respiratory Panel, DOES NOT test for the novel  Coronavirus (2019 nCoV)    Coronavirus HKU1 NOT DETECTED NOT DETECTED   Coronavirus NL63 NOT DETECTED NOT DETECTED   Coronavirus OC43 NOT DETECTED NOT DETECTED   Metapneumovirus NOT DETECTED NOT DETECTED   Rhinovirus / Enterovirus NOT DETECTED NOT DETECTED   Influenza A NOT DETECTED NOT DETECTED   Influenza B NOT DETECTED NOT DETECTED   Parainfluenza Virus 1 NOT DETECTED NOT DETECTED   Parainfluenza Virus 2 NOT DETECTED NOT DETECTED   Parainfluenza Virus 3 NOT DETECTED NOT DETECTED   Parainfluenza Virus 4 NOT DETECTED NOT DETECTED   Respiratory Syncytial Virus NOT DETECTED NOT DETECTED   Bordetella pertussis NOT DETECTED NOT DETECTED   Bordetella Parapertussis NOT DETECTED NOT DETECTED   Chlamydophila pneumoniae NOT DETECTED NOT DETECTED   Mycoplasma pneumoniae NOT DETECTED NOT DETECTED    Comment: Performed at [facility], [address], [place], [place] [address]  Basic metabolic panel with GFR     Status: Abnormal   Collection Time: 08/11/23  5:29 AM  Result Value Ref Range   Sodium 139 135 - 145 mmol/L   Potassium 3.5 3.5 - 5.1 mmol/L   Chloride 106 98 - 111 mmol/L   CO2 26 22 - 32 mmol/L   Glucose, Bld 134 (H) 70 - 99 mg/dL    Comment: Glucose reference range applies only to samples taken after fasting for at least 8 hours.   BUN 12 8 - 23 mg/dL   Creatinine, Ser 9.09 0.61 - 1.24 mg/dL   Calcium  7.9 (L) 8.9 - 10.3 mg/dL   GFR, Estimated >39 >39 mL/min    Comment: (NOTE) Calculated using the CKD-EPI Creatinine Equation (2021)    Anion gap 7 5 - 15    Comment: Performed at [facility], [address] [place], [place] [address]  CBC     Status: Abnormal    Collection Time: 08/11/23  5:29 AM  Result Value Ref Range   WBC 6.6 4.0 - 10.5 K/uL   RBC 4.10 (L) 4.22 - 5.81 MIL/uL   Hemoglobin 12.6 (L) 13.0 - 17.0 g/dL   HCT 62.1 (L) 60.9 - 47.9 %   MCV 92.2 80.0 - 100.0 fL   MCH 30.7 26.0 - 34.0 pg   MCHC 33.3 30.0 - 36.0 g/dL   RDW 83.8 (H) 88.4 - 84.4 %   Platelets 102 (L) 150 - 400 K/uL   nRBC 0.0 0.0 - 0.2 %    Comment: Performed at [facility], [address] [place], [place] [address]  Magnesium   Status: None   Collection Time: 08/11/23  5:29 AM  Result Value Ref Range   Magnesium  2.0 1.7 - 2.4 mg/dL    Comment: Performed at [facility], [address] [place], [place] [address]  NM PET CT CARDIAC PERFUSION MULTI W/ABSOLUTE BLOODFLOW     Status: None   Collection Time: 09/18/23  1:50 PM  Result Value Ref Range   Rest Nuclear Isotope Dose 24.4 mCi   Stress Nuclear Isotope Dose 24.4 mCi   Rest HR 55.0 bpm   Rest BP 127/72 mmHg   Peak HR 71 bpm   Peak BP 112/58 mmHg   SSS 20.0    SRS 14.0    TID 1.11    Nuc Stress EF 42 %   Nuc Rest EF 43 %   ST Depression (mm) 0 mm  VAS US  ABI WITH/WO TBI     Status: None   Collection Time: 09/29/23  9:11 AM  Result Value Ref Range   Right ABI >1.0 [place]    Left ABI >1.0 [place]     Radiology VAS US  EVAR DUPLEX Result Date: 10/03/2023 Endovascular Aortic Repair Study (EVAR) Patient Name:  DEMARCO BACCI  Date of Exam:   09/29/2023 Medical Rec #: [id number]          Accession #:    [id number] Date of Birth: 31-Dec-1945         Patient Gender: M Patient Age:   75 years Exam Location:  [facility] Procedure:      VAS US  EVAR DUPLEX Referring Phys: SELINDA GU --------------------------------------------------------------------------------  Indications: Follow up exam for EVAR.  Performing Technologist: Leafy Gibes RVS  Examination Guidelines: A complete evaluation includes B-mode imaging, spectral Doppler, color Doppler, and power Doppler as needed of all accessible  portions of each vessel. Bilateral testing is considered an integral part of a complete examination. Limited examinations for reoccurring indications may be performed as noted.  Endovascular Aortic Repair (EVAR): +----------+----------------+-------------------+-------------------+           Diameter AP (cm)Diameter Trans (cm)Velocities (cm/sec) +----------+----------------+-------------------+-------------------+ Aorta     3.90            4.14               77                  +----------+----------------+-------------------+-------------------+ Right Limb1.09            1.20               46                  +----------+----------------+-------------------+-------------------+ Left Limb 1.14            1.34               62                  +----------+----------------+-------------------+-------------------+  Summary: Abdominal Aorta: Patent endovascular aneurysm repair with no evidence of endoleak. Essentially no change from prior study on 09/27/2022.  *See table(s) above for measurements and observations.  Electronically signed by SELINDA GU MD on 10/03/2023 at 7:17:54 AM.   Final    VAS US  ABI WITH/WO TBI Result Date: 10/03/2023  LOWER EXTREMITY DOPPLER STUDY Patient Name:  DEBBRA JINNY KITTY  Date of Exam:   09/29/2023 Medical Rec #: [id number]          Accession #:    [id number] Date of Birth: 03-22-1945  Patient Gender: M Patient Age:   37 years Exam Location:  [facility] Procedure:      VAS US  ABI WITH/WO TBI Referring Phys: Selinda Gu --------------------------------------------------------------------------------  Indications: Peripheral artery disease. High Risk Factors: Hypertension, prior MI, coronary artery disease.  Vascular Interventions: EVAR 09/24/2017. Comparison Study: 09/27/2022 Performing Technologist: Leafy Gibes RVS  Examination Guidelines: A complete evaluation includes at minimum, Doppler waveform signals and systolic blood pressure reading at the  level of bilateral brachial, anterior tibial, and posterior tibial arteries, when vessel segments are accessible. Bilateral testing is considered an integral part of a complete examination. Photoelectric Plethysmograph (PPG) waveforms and toe systolic pressure readings are included as required and additional duplex testing as needed. Limited examinations for reoccurring indications may be performed as noted.  ABI Findings: +---------+------------------+-----+---------+--------+ Right    Rt Pressure (mmHg)IndexWaveform Comment  +---------+------------------+-----+---------+--------+ Brachial 134                                      +---------+------------------+-----+---------+--------+ ATA      228               1.70 triphasicNC       +---------+------------------+-----+---------+--------+ PTA      154               1.15 biphasic          +---------+------------------+-----+---------+--------+ [place]               1.02 Normal            +---------+------------------+-----+---------+--------+ +---------+------------------+-----+---------+-------+ Left     Lt Pressure (mmHg)IndexWaveform Comment +---------+------------------+-----+---------+-------+ Brachial 132                                     +---------+------------------+-----+---------+-------+ ATA      236               1.76 triphasicNC      +---------+------------------+-----+---------+-------+ PTA      147               1.10 triphasic        +---------+------------------+-----+---------+-------+ [place]               1.03 Normal           +---------+------------------+-----+---------+-------+ +-------+-----------+-----------+------------+------------+ ABI/TBIToday's ABIToday's TBIPrevious ABIPrevious TBI +-------+-----------+-----------+------------+------------+ Right  >1.0 [place]    1.02       [place]          .91          +-------+-----------+-----------+------------+------------+  Left   >1.0 [place]    1.03       [place]          .70          +-------+-----------+-----------+------------+------------+  Bilateral ABIs appear essentially unchanged compared to prior study on 09/27/2022. Bilateral TBIs appear increased compared to prior study on 09/27/2022.  Summary: Right: Resting right ankle-brachial index indicates noncompressible right lower extremity arteries. The right toe-brachial index is normal.  Left: Resting left ankle-brachial index indicates noncompressible left lower extremity arteries. The left toe-brachial index is normal.  *See table(s) above for measurements and observations.  Electronically signed by Selinda Gu MD on 10/03/2023 at 7:17:45 AM.    Final    NM PET CT CARDIAC PERFUSION MULTI W/ABSOLUTE BLOODFLOW Result Date: 09/18/2023   LV perfusion  is abnormal. There is no evidence of ischemia. There is evidence of infarction. Large area of severe intensity, fixed perfusion defect in basal to mid inferolateral/anterolateral and inferior wall suggestive of prior infarction in LCX/RCA territory with no ischemia.   Rest left ventricular function is abnormal. Rest global function is mildly reduced. Rest EF: 43%. Stress left ventricular function is abnormal. Stress global function is mildly reduced. Stress EF: 42%. No significant TID.   Myocardial blood flow reserve is not reported in this patient due to technical or patient-specific concerns that affect accuracy.   Coronary calcium  assessment not performed due to prior revascularization.   Findings are consistent with infarction. The study is high risk. CLINICAL DATA:  This over-read does not include interpretation of cardiac or coronary anatomy or pathology. The Cardiac PET CT interpretation by the cardiologist is attached. COMPARISON:  10/11/2022 lung cancer screening CT FINDINGS: No pleural fluid. Inferior right upper lobe it airspace disease on 08/04 is new, likely sequelae of right upper lobe pneumonia on 08/09/2023. Emphysema.  Ascending aortic dilatation at 4.1 cm is unchanged. Aortic atherosclerosis. Tortuous thoracic aorta. Pulmonary artery enlargement, outflow tract 3.1 cm. No imaged thoracic adenopathy. Cholecystectomy. Normal imaged portions of the liver, spleen, stomach, adrenal glands, right kidney. No acute osseous abnormality. IMPRESSION: New inferior right upper lobe airspace disease, likely a resolving pneumonia or developing scar when compared to 08/09/2023 chest radiograph. Aortic atherosclerosis (ICD10-I70.0) and emphysema (ICD10-J43.9). Ascending aortic dilatation at 4.1 cm, as before. This can be re-evaluated on routine lung cancer screening CT on or after 10/12/2023. Pulmonary artery enlargement suggests pulmonary arterial hypertension. Electronically Signed   By: Rockey Kilts M.D.   On: 09/18/2023 14:10  MR CARDIAC MORPHOLOGY W WO CONTRAST Result Date: 09/15/2023 CLINICAL DATA:  Evaluate for viability EXAM: CARDIAC MRI TECHNIQUE: The patient was scanned on a 1.5 Tesla Siemens magnet. A dedicated cardiac coil was used. Functional imaging was done using Fiesta sequences. 2,3, and 4 chamber views were done to assess for RWMA's. Modified Simpson's rule using a short axis stack was used to calculate an ejection fraction on a dedicated work [employer]. The patient received 12 cc of Gadavist . After 10 minutes inversion recovery sequences were used to assess for infiltration and scar tissue. Velocity flow mapping performed in the ascending aorta and main pulmonary artery. CONTRAST:  12 cc  of Gadavist  FINDINGS: 1. Normal left ventricular size and thickness. Moderately reduced LV systolic function (LVEF = 38%). Inferolateral and lateral LV wall akinesis noted There is near transmural late gadolinium enhancement in the basal-mid inferolateral and basal-mid lateral walls of the left ventricular myocardium. LVEDV: 146 ml LVESV: 91 ml SV: 56 ml CO: 4.4 L/min Myocardial mass: 125 g 2. Normal right ventricular  size, thickness and systolic function (RVEF =). There are no regional wall motion abnormalities. 3.  Normal left and right atrial size. 4. Normal size of the aortic root, ascending aorta and pulmonary artery. 5.  No significant valvular abnormalities. 6.  Normal pericardium.  No pericardial effusion. IMPRESSION: 1.  Moderately reduced LV systolic function.  LVEF 38%. 2. Near transmural LGE/scar involving the basal-mid LV inferolateral and lateral walls. 3.  Inferolateral and lateral LV wall akinesis noted. 4.  Normal RV systolic function. 5.  Inferolateral and lateral LV walls are not viable. 6.  LV anterior and septal walls appear viable. Electronically Signed   By: Redell Cave M.D.   On: 09/15/2023 16:06   MR CARDIAC VELOCITY FLOW MAP Result Date: 09/15/2023 CLINICAL  DATA:  Evaluate for viability EXAM: CARDIAC MRI TECHNIQUE: The patient was scanned on a 1.5 Tesla Siemens magnet. A dedicated cardiac coil was used. Functional imaging was done using Fiesta sequences. 2,3, and 4 chamber views were done to assess for RWMA's. Modified Simpson's rule using a short axis stack was used to calculate an ejection fraction on a dedicated work [employer]. The patient received 12 cc of Gadavist . After 10 minutes inversion recovery sequences were used to assess for infiltration and scar tissue. Velocity flow mapping performed in the ascending aorta and main pulmonary artery. CONTRAST:  12 cc  of Gadavist  FINDINGS: 1. Normal left ventricular size and thickness. Moderately reduced LV systolic function (LVEF = 38%). Inferolateral and lateral LV wall akinesis noted There is near transmural late gadolinium enhancement in the basal-mid inferolateral and basal-mid lateral walls of the left ventricular myocardium. LVEDV: 146 ml LVESV: 91 ml SV: 56 ml CO: 4.4 L/min Myocardial mass: 125 g 2. Normal right ventricular size, thickness and systolic function (RVEF =). There are no regional wall motion abnormalities.  3.  Normal left and right atrial size. 4. Normal size of the aortic root, ascending aorta and pulmonary artery. 5.  No significant valvular abnormalities. 6.  Normal pericardium.  No pericardial effusion. IMPRESSION: 1.  Moderately reduced LV systolic function.  LVEF 38%. 2. Near transmural LGE/scar involving the basal-mid LV inferolateral and lateral walls. 3.  Inferolateral and lateral LV wall akinesis noted. 4.  Normal RV systolic function. 5.  Inferolateral and lateral LV walls are not viable. 6.  LV anterior and septal walls appear viable. Electronically Signed   By: Redell Cave M.D.   On: 09/15/2023 16:06   MR CARDIAC VELOCITY FLOW MAP Result Date: 09/15/2023 CLINICAL DATA:  Evaluate for viability EXAM: CARDIAC MRI TECHNIQUE: The patient was scanned on a 1.5 Tesla Siemens magnet. A dedicated cardiac coil was used. Functional imaging was done using Fiesta sequences. 2,3, and 4 chamber views were done to assess for RWMA's. Modified Simpson's rule using a short axis stack was used to calculate an ejection fraction on a dedicated work [employer]. The patient received 12 cc of Gadavist . After 10 minutes inversion recovery sequences were used to assess for infiltration and scar tissue. Velocity flow mapping performed in the ascending aorta and main pulmonary artery. CONTRAST:  12 cc  of Gadavist  FINDINGS: 1. Normal left ventricular size and thickness. Moderately reduced LV systolic function (LVEF = 38%). Inferolateral and lateral LV wall akinesis noted There is near transmural late gadolinium enhancement in the basal-mid inferolateral and basal-mid lateral walls of the left ventricular myocardium. LVEDV: 146 ml LVESV: 91 ml SV: 56 ml CO: 4.4 L/min Myocardial mass: 125 g 2. Normal right ventricular size, thickness and systolic function (RVEF =). There are no regional wall motion abnormalities. 3.  Normal left and right atrial size. 4. Normal size of the aortic root, ascending aorta and  pulmonary artery. 5.  No significant valvular abnormalities. 6.  Normal pericardium.  No pericardial effusion. IMPRESSION: 1.  Moderately reduced LV systolic function.  LVEF 38%. 2. Near transmural LGE/scar involving the basal-mid LV inferolateral and lateral walls. 3.  Inferolateral and lateral LV wall akinesis noted. 4.  Normal RV systolic function. 5.  Inferolateral and lateral LV walls are not viable. 6.  LV anterior and septal walls appear viable. Electronically Signed   By: Redell Cave M.D.   On: 09/15/2023 16:06    Assessment/Plan  Peripheral vascular disease (HCC)  ABIs remain noncompressible but digit pressures are normal and waveforms remain multiphasic.  Continue current medical regimen.  No role for intervention.  Recheck in 1 year.  Abdominal aortic aneurysm (AAA) without rupture (HCC) Duplex today shows a patent stent graft without endoleak.  The aortic sac diameter is stable at 4.1 cm.  Doing well.  Several years status post repair.  Continue to follow on an annual basis with duplex.  Benign essential HTN blood pressure control important in reducing the progression of atherosclerotic disease and aneurysmal growth. On appropriate oral medications.     Type 2 diabetes mellitus (HCC) blood glucose control important in reducing the progression of atherosclerotic disease. Also, involved in wound healing. On appropriate medications.  Selinda Gu, MD  10/03/2023 11:19 AM    This note was created with Dragon medical transcription system.  Any errors from dictation are purely unintentional

## 2023-10-08 ENCOUNTER — Ambulatory Visit
Admission: RE | Admit: 2023-10-08 | Discharge: 2023-10-08 | Disposition: A | Discharge status: Home / Self Care | Source: Ambulatory Visit | Attending: Specialist | Admitting: Specialist

## 2023-10-08 DIAGNOSIS — Z87891 Personal history of nicotine dependence: Secondary | ICD-10-CM | POA: Diagnosis not present

## 2023-10-08 DIAGNOSIS — F17210 Nicotine dependence, cigarettes, uncomplicated: Secondary | ICD-10-CM | POA: Insufficient documentation

## 2023-10-08 DIAGNOSIS — Z122 Encounter for screening for malignant neoplasm of respiratory organs: Secondary | ICD-10-CM | POA: Diagnosis not present

## 2023-10-13 ENCOUNTER — Encounter: Payer: Self-pay | Admitting: Oncology

## 2023-10-13 ENCOUNTER — Inpatient Hospital Stay

## 2023-10-13 ENCOUNTER — Inpatient Hospital Stay: Attending: Oncology | Admitting: Oncology

## 2023-10-13 VITALS — BP 132/73 | HR 70 | Temp 98.4°F | Resp 18 | Ht 72.0 in | Wt 206.0 lb

## 2023-10-13 DIAGNOSIS — Z7901 Long term (current) use of anticoagulants: Secondary | ICD-10-CM | POA: Insufficient documentation

## 2023-10-13 DIAGNOSIS — C7A8 Other malignant neuroendocrine tumors: Secondary | ICD-10-CM

## 2023-10-13 DIAGNOSIS — K760 Fatty (change of) liver, not elsewhere classified: Secondary | ICD-10-CM | POA: Insufficient documentation

## 2023-10-13 DIAGNOSIS — I429 Cardiomyopathy, unspecified: Secondary | ICD-10-CM | POA: Insufficient documentation

## 2023-10-13 DIAGNOSIS — M069 Rheumatoid arthritis, unspecified: Secondary | ICD-10-CM | POA: Insufficient documentation

## 2023-10-13 DIAGNOSIS — F17210 Nicotine dependence, cigarettes, uncomplicated: Secondary | ICD-10-CM | POA: Insufficient documentation

## 2023-10-13 DIAGNOSIS — I739 Peripheral vascular disease, unspecified: Secondary | ICD-10-CM | POA: Diagnosis not present

## 2023-10-13 DIAGNOSIS — I4811 Longstanding persistent atrial fibrillation: Secondary | ICD-10-CM | POA: Diagnosis not present

## 2023-10-13 DIAGNOSIS — I110 Hypertensive heart disease with heart failure: Secondary | ICD-10-CM | POA: Diagnosis not present

## 2023-10-13 DIAGNOSIS — R161 Splenomegaly, not elsewhere classified: Secondary | ICD-10-CM | POA: Diagnosis not present

## 2023-10-13 DIAGNOSIS — Z8679 Personal history of other diseases of the circulatory system: Secondary | ICD-10-CM | POA: Diagnosis not present

## 2023-10-13 DIAGNOSIS — C9111 Chronic lymphocytic leukemia of B-cell type in remission: Secondary | ICD-10-CM | POA: Diagnosis not present

## 2023-10-13 DIAGNOSIS — E538 Deficiency of other specified B group vitamins: Secondary | ICD-10-CM | POA: Insufficient documentation

## 2023-10-13 DIAGNOSIS — I252 Old myocardial infarction: Secondary | ICD-10-CM | POA: Insufficient documentation

## 2023-10-13 DIAGNOSIS — I7121 Aneurysm of the ascending aorta, without rupture: Secondary | ICD-10-CM | POA: Insufficient documentation

## 2023-10-13 DIAGNOSIS — C9110 Chronic lymphocytic leukemia of B-cell type not having achieved remission: Secondary | ICD-10-CM | POA: Diagnosis not present

## 2023-10-13 DIAGNOSIS — I5022 Chronic systolic (congestive) heart failure: Secondary | ICD-10-CM | POA: Diagnosis not present

## 2023-10-13 DIAGNOSIS — D122 Benign neoplasm of ascending colon: Secondary | ICD-10-CM | POA: Insufficient documentation

## 2023-10-13 DIAGNOSIS — N400 Enlarged prostate without lower urinary tract symptoms: Secondary | ICD-10-CM | POA: Diagnosis not present

## 2023-10-13 DIAGNOSIS — D3A8 Other benign neuroendocrine tumors: Secondary | ICD-10-CM | POA: Insufficient documentation

## 2023-10-13 DIAGNOSIS — J432 Centrilobular emphysema: Secondary | ICD-10-CM | POA: Insufficient documentation

## 2023-10-13 DIAGNOSIS — I4891 Unspecified atrial fibrillation: Secondary | ICD-10-CM | POA: Diagnosis not present

## 2023-10-13 DIAGNOSIS — Z79899 Other long term (current) drug therapy: Secondary | ICD-10-CM | POA: Insufficient documentation

## 2023-10-13 DIAGNOSIS — G4733 Obstructive sleep apnea (adult) (pediatric): Secondary | ICD-10-CM | POA: Insufficient documentation

## 2023-10-13 DIAGNOSIS — I700 Atherosclerosis of aorta: Secondary | ICD-10-CM | POA: Diagnosis not present

## 2023-10-13 DIAGNOSIS — M47814 Spondylosis without myelopathy or radiculopathy, thoracic region: Secondary | ICD-10-CM | POA: Insufficient documentation

## 2023-10-13 DIAGNOSIS — I2510 Atherosclerotic heart disease of native coronary artery without angina pectoris: Secondary | ICD-10-CM | POA: Insufficient documentation

## 2023-10-13 DIAGNOSIS — E785 Hyperlipidemia, unspecified: Secondary | ICD-10-CM | POA: Diagnosis not present

## 2023-10-13 DIAGNOSIS — Z86718 Personal history of other venous thrombosis and embolism: Secondary | ICD-10-CM | POA: Insufficient documentation

## 2023-10-13 DIAGNOSIS — Z7982 Long term (current) use of aspirin: Secondary | ICD-10-CM | POA: Insufficient documentation

## 2023-10-13 LAB — BASIC METABOLIC PANEL - CANCER CENTER ONLY
Anion gap: 8 (ref 5–15)
BUN: 13 mg/dL (ref 8–23)
CO2: 27 mmol/L (ref 22–32)
Calcium: 8.7 mg/dL — ABNORMAL LOW (ref 8.9–10.3)
Chloride: 105 mmol/L (ref 98–111)
Creatinine: 0.78 mg/dL (ref 0.61–1.24)
GFR, Estimated: >60 mL/min (ref >60)
Glucose, Bld: 146 mg/dL — ABNORMAL HIGH (ref 70–99)
Potassium: 3.2 mmol/L — ABNORMAL LOW (ref 3.5–5.1)
Sodium: 140 mmol/L (ref 135–145)

## 2023-10-13 LAB — CBC WITH DIFFERENTIAL (CANCER CENTER ONLY)
Abs Immature Granulocytes: 0.01 K/uL (ref 0.00–0.07)
Basophils Absolute: 0 K/uL (ref 0.0–0.1)
Basophils Relative: 0 %
Eosinophils Absolute: 0 K/uL (ref 0.0–0.5)
Eosinophils Relative: 1 %
HCT: 43.8 % (ref 39.0–52.0)
Hemoglobin: 14.7 g/dL (ref 13.0–17.0)
Immature Granulocytes: 0 %
Lymphocytes Relative: 41 %
Lymphs Abs: 2.4 K/uL (ref 0.7–4.0)
MCH: 31.2 pg (ref 26.0–34.0)
MCHC: 33.6 g/dL (ref 30.0–36.0)
MCV: 93 fL (ref 80.0–100.0)
Monocytes Absolute: 0.6 K/uL (ref 0.1–1.0)
Monocytes Relative: 11 %
Neutro Abs: 2.7 K/uL (ref 1.7–7.7)
Neutrophils Relative %: 47 %
Platelet Count: 141 K/uL — ABNORMAL LOW (ref 150–400)
RBC: 4.71 MIL/uL (ref 4.22–5.81)
RDW: 15.1 % (ref 11.5–15.5)
Smear Review: NORMAL
WBC Count: 5.8 K/uL (ref 4.0–10.5)
nRBC: 0 % (ref 0.0–0.2)

## 2023-10-13 LAB — HEPATIC FUNCTION PANEL
ALT: 18 U/L (ref 0–44)
AST: 26 U/L (ref 15–41)
Albumin: 4.2 g/dL (ref 3.5–5.0)
Alkaline Phosphatase: 86 U/L (ref 38–126)
Bilirubin, Direct: 0.3 mg/dL — ABNORMAL HIGH (ref 0.0–0.2)
Indirect Bilirubin: 2.1 mg/dL — ABNORMAL HIGH (ref 0.3–0.9)
Total Bilirubin: 2.4 mg/dL — ABNORMAL HIGH (ref 0.0–1.2)
Total Protein: 6.7 g/dL (ref 6.5–8.1)

## 2023-10-13 LAB — LACTATE DEHYDROGENASE: LDH: 141 U/L (ref 98–192)

## 2023-10-13 NOTE — Assessment & Plan Note (Signed)
on chronic anticoagulation with Xarelto. Follow-up with cardiology.

## 2023-10-13 NOTE — Progress Notes (Signed)
 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  I clarified with radiology Dr. Marthann.  An addendum was added that the T staging [ post resection] should be T0, no muscularis propia invasion.  10/2020 Flex sigmoidoscopy by Dr.Mansouraty did not reveal significant pathology at the rectosigmoid junction.  Flex sigmoidoscopy in Oct 2023-report not available to me. Check CT abdomen pelvis with contrast annually-next due Oct 2025 CT. Continue follow up with GI   Previous chromogranin A was elevated likely due to being on PPI.  He has tried to hold off PPI and take Tums prior to testing which did not work for him. I will hold off rechecking chromogranin A.  CLL (chronic lymphocytic leukemia) (HCC) #CLL, stable.  Patient has been in remission.  Off treatment since November 2019. Lymphocytosis 4.7, Early recurrence. He is asymptomatic without constitutional symptoms.. Continue watchful observation.   Atrial fibrillation (HCC) on chronic anticoagulation with Xarelto . Follow-up with cardiology.   Orders Placed This Encounter  Procedures   CT ABDOMEN PELVIS W CONTRAST    Standing Status:   Future    Expected Date:   10/20/2023    Expiration Date:   10/12/2024    If indicated for the ordered procedure, I authorize the administration of contrast media per Radiology protocol:   Yes    Does the patient have a contrast media/X-ray dye allergy?:   No    Preferred imaging location?:   [facility]    If indicated for the ordered procedure, I authorize the administration of oral contrast media per Radiology protocol:   Yes   CBC with Differential (Cancer Center Only)    Standing Status:   Future     Number of Occurrences:   1    Expected Date:   10/13/2023    Expiration Date:   01/11/2024   Basic Metabolic Panel - Cancer Center Only    Standing Status:   Future    Number of Occurrences:   1    Expected Date:   10/13/2023    Expiration Date:   01/11/2024   Hepatic function panel    Standing Status:   Future    Number of Occurrences:   1    Expected Date:   10/13/2023    Expiration Date:   01/11/2024   Flow cytometry panel-leukemia/lymphoma work-up    Standing Status:   Future    Number of Occurrences:   1    Expected Date:   10/13/2023    Expiration Date:   01/11/2024   Lactate dehydrogenase    Standing Status:   Future    Number of Occurrences:   1    Expected Date:   10/13/2023    Expiration Date:   01/11/2024   CBC with Differential (Cancer Center Only)    Standing Status:   Future    Expected Date:   04/12/2024    Expiration Date:   07/11/2024   Hepatic function panel    Standing Status:   Future    Expected Date:   04/12/2024    Expiration Date:   07/11/2024   Basic Metabolic Panel - Cancer Center Only    Standing Status:   Future    Expected Date:   04/12/2024  Expiration Date:   07/11/2024   Flow cytometry panel-leukemia/lymphoma work-up    Standing Status:   Future    Expected Date:   04/12/2024    Expiration Date:   07/11/2024   Lactate dehydrogenase    Standing Status:   Future    Expected Date:   04/12/2024    Expiration Date:   07/11/2024   Follow-up in 6 months. All questions were answered. The patient knows to call the clinic with any problems, questions or concerns.  Zelphia Cap, MD, PhD [facility] Hematology Oncology 10/13/2023    HISTORY OF PRESENTING ILLNESS:  Martin Adkins is a  78 y.o.  male with PMH listed below who was referred to me for evaluation of lymphocytosis.  Recent lab work on 03/05/2017 showed wbc 11.4, hb 14.6, platelet count 117,000, lymphocytosis 63.5%, neutrophil 22%, Report chronic fatigue, no weight loss, fever or chills.  He reports feeling chest  rattleness. Has for CT chest which showed acute finding, chronic finding includes  postsurgical changes consistent with coronary bypass grafting. One of the bypass grafts arising from the aorta shows apparent thrombosis and an area of distal aneurysmal dilatation which is also Thrombosed. Right adrenal adenoma stable in appearance. Mild scarring in the lung bases.Fatty liver.   Takes Aspirin  81mg , coumadine, plavix . He is on anticoagulation for previous history of clots.  Denies any bleeding events. Use to drink hard liquir daily, quitted for 4-5 years. Current drinks wine on weekend.    Image Studies # 04/04/2017  US  abdomen showed 1.  Splenomegaly.  No focal splenic lesions evident. 2. Increased liver echogenicity, a finding most likely indicative of hepatic steatosis. While no focal liver lesions are evident on this study, it must be cautioned that the sensitivity of ultrasound for detection of focal liver lesions is diminished in this circumstance.3. Pancreas obscured by gas. Portions of inferior vena cava obscured by gas.4.  Gallbladder absent.5. Status post abdominal aortic aneurysm repair. No periaortic fluid.  # 04/21/2017 PET scan 1. Moderate splenomegaly with uniform splenic hypermetabolism,compatible with the provided history of lymphoproliferative disease.No additional hypermetabolic sites of lymphoproliferative disease. Specifically, no hypermetabolic lymphadenopathy Hepatic steatosis, aortic atherosclerosis. Aneurysm.    # CLL, stage IV disease given spelencemagly, thrombocytopenia, symptomatic with fatigue and weight loss.  CLL IPL score 4, high risk.   # status post abdominal aortic aneurysm repair on 09/25/2017 s/p cardiac cath which revealed patent LIMA to the LAD, no significant disease in the RCA with a 90% stenosis in a small RV marginal branch.  Occluded left circumflex, occluded LAD.  Saphenous vein grafts are all occluded.  RCA does not require grafting.  LAD is well  grafted by the left internal mammary.  Medical management  07/24/2020, colonoscopy showed diverticulosis in the sigmoid colon.  Multiple polyps were removed from descending colon, proximal ascending colon, transverse colon, sigmoid colon and 5 small 2-91mm polyps resected from rectum. Polyps from the colon are tubular adenoma, negative for high-grade dysplasia and malignancy. Polyps from rectum showed well differentiated neuroendocrine tumor, grade 1, involving the base of the submitted tissue-1 fragment, hyperplastic polyp 2 fragments.  10/09/2020 EUS by Dr.Mansouraty  FLEX Impression: Stool in the sigmoid colon and in the descending colon. - Preparation of the colon was fair even after significant lavage - however large stool began to come through the rest of the colon as procedure continued. - >30 1 to 5 mm polyps in the rectum, at the recto-sigmoid colon and in the sigmoid colon. 16 of the  polyps which were in the rectum were removed with a cold snare. Resected and retrieved.- Non-bleeding non-thrombosed external and internal hemorrhoids  EUS Impression: - Endosonographic imaging showed no sign of significant pathology at the rectosigmoid junction. - Endosonographic images of the rectum were unremarkable. - Endosonographic images of the perirectal space were unremarkable. - No malignant-appearing lymph nodes were visualized endosonographically in the perirectal region and in the left iliac region. - The internal anal sphincter was visualized endosonographically and appeared normal  Dr.Mansouraty recommend repeat 1 year Flex sigmoidoscopy with EUS  INTERVAL HISTORY Martin Adkins is a 78 y.o. male who has above presents for follow-up of CLL, rectal neuroendocrine tumor. He has been off venetoclax  since 11/11/2017. Patient denies unintentional weight loss, night sweats, fever.  Appetite is good. No new complaints. He takes protonix  long term.   No new complaints August 2025, patient was  hospitalized due to sepsis secondary to right upper lobe pneumonia.  Patient was treated with IV antibiotics and finished a course of p.o. antibiotics.  Review of Systems  Constitutional:  Negative for appetite change, chills, fatigue, fever and unexpected weight change.  HENT:   Negative for hearing loss and voice change.   Eyes:  Negative for eye problems and icterus.  Respiratory:  Negative for chest tightness, cough, shortness of breath and wheezing.   Cardiovascular:  Negative for chest pain and leg swelling.  Gastrointestinal:  Negative for abdominal distention and abdominal pain.  Endocrine: Negative for hot flashes.  Genitourinary:  Negative for difficulty urinating, dysuria and frequency.   Musculoskeletal:  Negative for arthralgias and back pain.  Skin:  Negative for itching and rash.  Neurological:  Positive for numbness. Negative for dizziness, headaches and light-headedness.  Hematological:  Negative for adenopathy. Does not bruise/bleed easily.  Psychiatric/Behavioral:  Negative for confusion.      MEDICAL HISTORY:  Past Medical History:  Diagnosis Date   AAA (abdominal aortic aneurysm) without rupture    a.) US  02/09/04 - 3.1 x 3.4 cm. b.) US  02/14/05 - 3.62 x 3.63 cm. c.) US  02/25/06 - 3.97 x 4.0 cm. d.) CT 07/09/11 - 4.9 cm. e.) s/p EVAR 2013. f.) enlarging AAA --> back to the OR on 09/24/2017 for placement of a 27 mm x 12 cm RIGHT iliac extension limb down to distal CIA.   Aortic atherosclerosis    APS (antiphospholipid syndrome)    Arthritis    Atrial fibrillation (HCC)    a.) CHA2DS2-VASc Score = 6 (age, CHF, HTN, prior DVT x 2, prior MI). b.) on rivaroxaban    B12 deficiency 03/21/2017   Barrett's esophagus    BPH (benign prostatic hyperplasia)    CAD (coronary artery disease)    a.) LHC 11/14/95 --> 95% mLCx, 95% OM1; PTCA. b.) 4v CABG 2004 --> LIMA-LAD, SVG-D1,RCA,OM. c.) NSTEMI 05/02/2013 --> PCI with DES x 3 to SVG-OM in 2 distinct areas with filter. d.) Inf  STEMI 07/30/14 - LHC --> 50% LM, 90% mLCx, 100% OM2, 100% RPDA; LIMA-LAD patent; 100% occ of SVG OM2,OM3,PDA; med mgmt. e.) LHC 01/19/18 - patent LIMA-LAD; other grafts occ; not amenable to PCI/further bypass.   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 LV systolic dysfunction; LVEF 35-40%.   History of hiatal  hernia    History of shingles 2013   Hyperlipemia    Hyperplastic polyps of stomach    a.) Bx on 07/24/2020 --> well differentiated NET; WHO grade I.   Hypertension    NSTEMI (non-ST elevated myocardial infarction) (HCC) 05/02/2013   a.) LHC 05/03/2013 --> 50% LM, 100% LAD, 75% mLCx, 100% dLCx, 60% mRCA; patient LIMA-LAD, occluded SVG-D1 and SVG-PDA. SVG-OM with extensive thrombus and distal 99% lesion. HIGH RISK PCI performed at [facility] with placement of DES x 3 to 2 distinct areas of SVG with filter protection.   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 No intervention; medical management.   Valvular regurgitation    a.) TTE 02/22/2020 --> LVEF 35-40%; mild panvalvular.    SURGICAL HISTORY: Past Surgical History:  Procedure Laterality Date   ABDOMINAL AORTA STENT     BIOPSY   11/05/2021   Procedure: BIOPSY;  Surgeon: Wilhelmenia Aloha Raddle., MD;  Location: [facility];  Service: Gastroenterology;;   CHOLECYSTECTOMY     COLONOSCOPY  11/24/2009   COLONOSCOPY N/A 10/09/2020   Procedure: COLONOSCOPY;  Surgeon: Wilhelmenia Aloha Raddle., MD;  Location: [facility];  Service: Gastroenterology;  Laterality: N/A;   COLONOSCOPY WITH PROPOFOL  N/A 07/24/2020   Procedure: COLONOSCOPY WITH PROPOFOL ;  Surgeon: Toledo, Ladell POUR, MD;  Location: [facility];  Service: Gastroenterology;  Laterality: N/A;   CORONARY ANGIOPLASTY WITH STENT PLACEMENT Left 05/03/2013   Procedure: CORONARY ANGIOPLASTY WITH STENT PLACEMENT (DES x 3 to SVG-OM graft); Location: [facility]   CORONARY ARTERY BYPASS GRAFT N/A 01/21/2002   Procedure: 4V CABG (LIMA-LAD, SVG-D1, SVG-RCA, SVG-OM); Location: [facility]   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   Procedure: DUPUYTREN CONTRACTURE RELEASE LEFT LONG FINGER;  Surgeon: Edie Norleen PARAS, MD;  Location: [facility];  Service: Orthopedics;  Laterality: Left;   DUPUYTREN CONTRACTURE RELEASE Right 10/18/2020   Procedure: DUPUYTREN CONTRACTURE RELEASE;  Surgeon: Edie Norleen PARAS, MD;  Location: [facility];  Service: Orthopedics;  Laterality: Right;   ENDOVASCULAR STENT GRAFT (AAA) N/A 09/24/2017   Procedure: ENDOVASCULAR REPAIR/STENT GRAFT (27 mm x 12 cm RIGHT iliac limb extension to distal CIA);  Surgeon: Marea Selinda RAMAN, MD;  Location: [facility];  Service: Cardiovascular;  Laterality: N/A;   ESOPHAGOGASTRODUODENOSCOPY  05/26/02  03/23/12   ESOPHAGOGASTRODUODENOSCOPY (EGD) WITH PROPOFOL  N/A 10/28/2016   Procedure: ESOPHAGOGASTRODUODENOSCOPY (EGD) WITH PROPOFOL ;  Surgeon: Viktoria Lamar DASEN, MD;  Location: [facility];  Service: Endoscopy;  Laterality: N/A;   ESOPHAGOGASTRODUODENOSCOPY (EGD) WITH PROPOFOL  N/A 11/05/2021   Procedure: ESOPHAGOGASTRODUODENOSCOPY (EGD) WITH PROPOFOL ;  Surgeon: Wilhelmenia Aloha Raddle., MD;  Location: [facility];  Service:  Gastroenterology;  Laterality: N/A;   EUS N/A 10/09/2020   Procedure: LOWER ENDOSCOPIC ULTRASOUND (EUS);  Surgeon: Wilhelmenia Aloha Raddle., MD;  Location: [facility];  Service: Gastroenterology;  Laterality: N/A;   EUS N/A 11/05/2021   Procedure: LOWER ENDOSCOPIC ULTRASOUND (  EUS);  Surgeon: Mansouraty, Aloha Raddle., MD;  Location: [facility];  Service: Gastroenterology;  Laterality: N/A;   EUS N/A 11/11/2022   Procedure: LOWER ENDOSCOPIC ULTRASOUND (EUS);  Surgeon: Wilhelmenia Aloha Raddle., MD;  Location: [facility];  Service: Gastroenterology;  Laterality: N/A;   FLEXIBLE SIGMOIDOSCOPY N/A 11/05/2021   Procedure: FLEXIBLE SIGMOIDOSCOPY;  Surgeon: Wilhelmenia Aloha Raddle., MD;  Location: [facility];  Service: Gastroenterology;  Laterality: N/A;   FLEXIBLE SIGMOIDOSCOPY N/A 11/11/2022   Procedure: FLEXIBLE SIGMOIDOSCOPY;  Surgeon: Wilhelmenia Aloha Raddle., MD;  Location: [facility];  Service: Gastroenterology;  Laterality: N/A;   HEMOSTASIS CLIP PLACEMENT  11/05/2021   Procedure: HEMOSTASIS CLIP PLACEMENT;  Surgeon: Wilhelmenia Aloha Raddle., MD;  Location: [facility];  Service: Gastroenterology;;   INCISION AND DRAINAGE ABSCESS N/A 06/14/2020   Procedure: INCISION AND DRAINAGE ABSCESS;  Surgeon: Rodolph Romano, MD;  Location: [facility];  Service: General;  Laterality: N/A;   INGUINAL HERNIA REPAIR Left    LEFT HEART CATH AND CORONARY ANGIOGRAPHY N/A 01/19/2018   Procedure: LEFT HEART CATH AND CORONARY ANGIOGRAPHY;  Surgeon: Bosie Vinie LABOR, MD;  Location: [facility];  Service: Cardiovascular;  Laterality: N/A;   LEFT HEART CATH AND CORONARY ANGIOGRAPHY Left 11/14/1995   Procedure: CARDIAC CATHETERIZATION (PTCA); Location: [facility]; Surgeon: Miquel Ellen, MD   LEFT HEART CATH AND CORS/GRAFTS ANGIOGRAPHY Left 07/30/2014   Procedure: CARDIAC CATHETERIZATION; Location: [facility]   PERIPHERAL VASCULAR CATHETERIZATION N/A 09/06/2014   Procedure: IVC Filter Removal;  Surgeon: Cordella KANDICE Shawl, MD;  Location: [facility];  Service: Cardiovascular;  Laterality: N/A;   POLYPECTOMY  10/09/2020   Procedure: POLYPECTOMY;  Surgeon: Wilhelmenia Aloha Raddle., MD;  Location: [facility];  Service: Gastroenterology;;   POLYPECTOMY  11/05/2021   Procedure: POLYPECTOMY;  Surgeon: Wilhelmenia Aloha Raddle., MD;  Location: [facility];  Service: Gastroenterology;;   POLYPECTOMY  11/11/2022   Procedure: POLYPECTOMY;  Surgeon: Wilhelmenia Aloha Raddle., MD;  Location: [facility];  Service: Gastroenterology;;   SUBMUCOSAL LIFTING INJECTION  10/09/2020   Procedure: SUBMUCOSAL LIFTING INJECTION;  Surgeon: Wilhelmenia Aloha Raddle., MD;  Location: [facility];  Service: Gastroenterology;;   SUBMUCOSAL TATTOO INJECTION  11/05/2021   Procedure: SUBMUCOSAL TATTOO INJECTION;  Surgeon: Wilhelmenia Aloha Raddle., MD;  Location: [facility];  Service: Gastroenterology;;   TRANSURETHRAL RESECTION OF PROSTATE N/A 02/20/2015   Procedure: TRANSURETHRAL RESECTION OF THE PROSTATE (TURP);  Surgeon: Rosina Riis, MD;  Location: [facility];  Service: Urology;  Laterality: N/A;    SOCIAL HISTORY: Social History   Socioeconomic History   Marital status: Legally Separated    Spouse name: Not on file   Number of children: Not on file   Years of education: Not on file   Highest education level: Not on file  Occupational History   Not on file  Tobacco Use   Smoking status: Some Days    Current packs/day: 0.01    Average packs/day: (0.2 ttl pk-yrs)    Types: Cigarettes   Smokeless tobacco: Never   Tobacco comments:    sometimes 2-3 a day  Vaping Use   Vaping status: Never Used  Substance and Sexual Activity   Alcohol  use: Yes    Alcohol /week: 1.0 - 2.0 standard drink of alcohol     Types: 1 - 2 Shots of liquor per week    Comment: casual   Drug use: No   Sexual activity: Never  Other Topics Concern   Not on file  Social History Narrative   Not on file   Social Drivers  of Health    Financial Resource Strain: Low Risk  (04/10/2023)   Received from [facility]   Overall Financial Resource Strain (CARDIA)    Difficulty of Paying Living Expenses: Not hard at all  Food Insecurity: No Food Insecurity (08/10/2023)   Hunger Vital Sign    Worried About Running Out of Food in the Last Year: Never true    Ran Out of Food in the Last Year: Never true  Transportation Needs: No Transportation Needs (08/10/2023)   PRAPARE - Administrator, Civil Service (Medical): No    Lack of Transportation (Non-Medical): No  Physical Activity: Sufficiently Active (09/24/2017)   Exercise Vital Sign    Days of Exercise per Week: 7 days    Minutes of Exercise per Session: 40 min  Stress: No Stress Concern Present (09/24/2017)   [employer] of Occupational Health - Occupational Stress Questionnaire    Feeling of Stress : Not at all  Social Connections: Moderately Integrated (08/10/2023)   Social Connection and Isolation Panel    Frequency of Communication with Friends and Family: More than three times a week    Frequency of Social Gatherings with Friends and Family: More than three times a week    Attends Religious Services: 1 to 4 times per year    Active Member of [employer] or Organizations: No    Attends Banker Meetings: 1 to 4 times per year    Marital Status: Separated  Intimate Partner Violence: Not At Risk (08/10/2023)   Humiliation, Afraid, Rape, and Kick questionnaire    Fear of Current or Ex-Partner: No    Emotionally Abused: No    Physically Abused: No    Sexually Abused: No    FAMILY HISTORY: Family History  Problem Relation Age of Onset   Cancer Mother 42       BREAST   Diabetes Mother    Coronary artery disease Father    Heart attack Father    Prostate cancer Brother    Bladder Cancer Neg Hx    Kidney cancer Neg Hx     ALLERGIES:  is allergic to ace inhibitors, amoxicillin, niacin, penicillins, pravastatin, rosuvastatin, and  simvastatin.  MEDICATIONS:  Current Outpatient Medications  Medication Sig Dispense Refill   acetaminophen  (TYLENOL ) 325 MG tablet Take 2 tablets (650 mg total) by mouth every 6 (six) hours as needed for mild pain (pain score 1-3) or fever (or Fever >/= 101). 10 tablet 0   albuterol  (VENTOLIN  HFA) 108 (90 Base) MCG/ACT inhaler SMARTSIG:2 inhalation Via Inhaler Every 6 Hours PRN     [Paused] amLODipine  (NORVASC ) 10 MG tablet Take 10 mg by mouth daily.      atorvastatin  (LIPITOR ) 80 MG tablet Take 80 mg by mouth daily.   11   Cholecalciferol  (VITAMIN D3) 50 MCG (2000 UT) CAPS Take 2,000 Units/day by mouth daily.     fluticasone  (FLONASE ) 50 MCG/ACT nasal spray Place 2 sprays into both nostrils daily as needed for allergies.     gabapentin  (NEURONTIN ) 300 MG capsule Take 1 capsule (300 mg total) by mouth 3 (three) times daily. (Patient taking differently: Take 600 mg by mouth 2 (two) times daily.) 90 capsule 0   guaiFENesin  (ROBITUSSIN) 100 MG/5ML liquid Take 5 mLs by mouth every 4 (four) hours as needed for cough or to loosen phlegm. 120 mL 0   [Paused] isosorbide  mononitrate (IMDUR ) 60 MG 24 hr tablet Take 1 tablet (60 mg total) by mouth daily. 30 tablet  0   latanoprost  (XALATAN ) 0.005 % ophthalmic solution Place 1 drop into both eyes at bedtime.      [Paused] losartan  (COZAAR ) 25 MG tablet Take 25 mg by mouth daily.     methocarbamol  (ROBAXIN ) 500 MG tablet Take by mouth.     metoprolol  succinate (TOPROL -XL) 100 MG 24 hr tablet Take 0.5 tablets (50 mg total) by mouth daily. 30 tablet 0   Multiple Vitamins-Minerals (MULTIVITAMIN MEN 50+ PO) Take 1 tablet by mouth daily.     pantoprazole  (PROTONIX ) 40 MG tablet Take 1 tablet (40 mg total) by mouth 2 (two) times daily before a meal. 60 tablet 6   pantoprazole  (PROTONIX ) 40 MG tablet Take 40 mg by mouth daily.     potassium chloride  SA (K-DUR,KLOR-CON ) 20 MEQ tablet Take 20 mEq by mouth daily.      rivaroxaban  (XARELTO ) 20 MG TABS tablet Take 1  tablet (20 mg total) by mouth daily with supper.     tiZANidine (ZANAFLEX) 2 MG tablet Take between 1mg  (half a tablet) and 4mg  (two tablets) up to twice daily as needed for musculoskeletal pain.     TRELEGY ELLIPTA 100-62.5-25 MCG/ACT AEPB Inhale 1 puff into the lungs daily.     DULoxetine (CYMBALTA) 30 MG capsule Take by mouth. (Patient not taking: Reported on 10/13/2023)     No current facility-administered medications for this visit.     PHYSICAL EXAMINATION: ECOG 1 Vitals:   10/13/23 1006  BP: 132/73  Pulse: 70  Resp: 18  Temp: 98.4 F (36.9 C)  SpO2: 97%  Physical Exam Vitals and nursing note reviewed.  Constitutional:      General: He is not in acute distress. HENT:     Head: Normocephalic and atraumatic.     Mouth/Throat:     Pharynx: No oropharyngeal exudate.  Eyes:     General: No scleral icterus. Neck:     Vascular: No JVD.  Cardiovascular:     Rate and Rhythm: Normal rate.     Heart sounds: Normal heart sounds. No murmur heard. Pulmonary:     Effort: Pulmonary effort is normal. No respiratory distress.     Breath sounds: No wheezing or rales.  Chest:     Chest wall: No tenderness.  Abdominal:     General: There is no distension.     Palpations: Abdomen is soft.     Tenderness: There is no abdominal tenderness.  Musculoskeletal:        General: No deformity. Normal range of motion.     Cervical back: Normal range of motion and neck supple.  Lymphadenopathy:     Cervical: No cervical adenopathy.  Skin:    General: Skin is warm and dry.     Findings: No erythema.  Neurological:     Mental Status: He is alert and oriented to person, place, and time. Mental status is at baseline.     Cranial Nerves: No cranial nerve deficit.     Motor: No abnormal muscle tone.  Psychiatric:        Mood and Affect: Mood and affect normal.      LABORATORY DATA:  I have reviewed the data as listed    Latest Ref Rng & Units 10/13/2023   10:45 AM 08/11/2023    5:29 AM  08/10/2023    4:55 AM  CBC  WBC 4.0 - 10.5 K/uL 5.8  6.6  10.9   Hemoglobin 13.0 - 17.0 g/dL 85.2  87.3  87.4   Hematocrit 39.0 -  52.0 % 43.8  37.8  38.5   Platelets 150 - 400 K/uL 141  102  102    .cmpl  Hepatitis panel negative.  LDH 228, mildly elevated Beta 2 microglobulin normal.   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 The morphologic and immunophenotypic features are most consistent with chronic lymphocytic leukemia/small lymphocytic lymphoma. Bone marrow Cytogenetics : normal.  Peripheral blood FISH panel negative for CCND1- IGH mutation, ATM, 12, 13q, Tp53 mutation.   RADIOGRAPHIC STUDIES: I have personally reviewed the radiological images as listed and agreed with the findings in the report. CT CHEST LUNG CA SCREEN LOW DOSE W/O CM Result Date: 10/10/2023 CLINICAL DATA:  78 year old male smoker with 55 pack-year smoking history EXAM: CT CHEST WITHOUT CONTRAST LOW-DOSE FOR LUNG CANCER SCREENING TECHNIQUE: Multidetector CT imaging of the chest was performed following the standard protocol without IV contrast. RADIATION DOSE REDUCTION: This exam was performed according to the departmental dose-optimization program which includes automated exposure control, adjustment of the mA and/or kV according to patient size and/or use of iterative reconstruction technique. COMPARISON:  10/11/2022 screening chest CT FINDINGS: Cardiovascular: Normal heart size. No significant pericardial effusion/thickening. Three-vessel coronary atherosclerosis status post CABG. Atherosclerotic thoracic aorta with 4.5 cm ascending thoracic aortic aneurysm. Normal caliber pulmonary arteries. Mediastinum/Nodes: No significant thyroid  nodules. Unremarkable esophagus. No pathologically enlarged axillary, mediastinal or hilar lymph nodes, noting limited sensitivity for the detection of hilar adenopathy on this noncontrast study. Lungs/Pleura: No pneumothorax. No pleural  effusion. Mild paraseptal and centrilobular emphysema with diffuse bronchial wall thickening. Thick curvilinear bandlike consolidation in the peripheral basilar right upper lobe, slightly decreased from 09/18/2023 cardiac CT, compatible with evolving postinfectious/postinflammatory scarring. No significant pulmonary nodules. Upper abdomen: Cholecystectomy. Musculoskeletal: No aggressive appearing focal osseous lesions. Mild thoracic spondylosis. Intact sternotomy wires. IMPRESSION: 1. Lung-RADS 1, negative. Continue annual screening with low-dose chest CT without contrast in 12 months. 2. Ascending thoracic aortic 4.5 cm aneurysm. Ascending thoracic aortic aneurysm. Recommend semi-annual imaging followup by CTA or MRA and referral to cardiothoracic surgery if not already obtained. This recommendation follows 2010 ACCF/AHA/AATS/ACR/ASA/SCA/SCAI/SIR/STS/SVM Guidelines for the Diagnosis and Management of Patients With Thoracic Aortic Disease. Circulation. 2010; 121: Z733-z630. Aortic aneurysm NOS (ICD10-I71.9). 3. Aortic Atherosclerosis (ICD10-I70.0) and Emphysema (ICD10-J43.9). Electronically Signed   By: Selinda DELENA Blue M.D.   On: 10/10/2023 19:36   VAS US  EVAR DUPLEX Result Date: 10/03/2023 Endovascular Aortic Repair Study (EVAR) Patient Name:  Martin Adkins  Date of Exam:   09/29/2023 Medical Rec #: [id number]          Accession #:    [id number] Date of Birth: 1945/08/26         Patient Gender: M Patient Age:   36 years Exam Location:  [facility] Procedure:      VAS US  EVAR DUPLEX Referring Phys: SELINDA GU --------------------------------------------------------------------------------  Indications: Follow up exam for EVAR.  Performing Technologist: Leafy Gibes RVS  Examination Guidelines: A complete evaluation includes B-mode imaging, spectral Doppler, color Doppler, and power Doppler as needed of all accessible portions of each vessel. Bilateral testing is considered an integral part of a  complete examination. Limited examinations for reoccurring indications may be performed as noted.  Endovascular Aortic Repair (EVAR): +----------+----------------+-------------------+-------------------+           Diameter AP (cm)Diameter Trans (cm)Velocities (cm/sec) +----------+----------------+-------------------+-------------------+ Aorta     3.90            4.14  77                  +----------+----------------+-------------------+-------------------+ Right Limb1.09            1.20               46                  +----------+----------------+-------------------+-------------------+ Left Limb 1.14            1.34               62                  +----------+----------------+-------------------+-------------------+  Summary: Abdominal Aorta: Patent endovascular aneurysm repair with no evidence of endoleak. Essentially no change from prior study on 09/27/2022.  *See table(s) above for measurements and observations.  Electronically signed by Selinda Gu MD on 10/03/2023 at 7:17:54 AM.   Final    VAS US  ABI WITH/WO TBI Result Date: 10/03/2023  LOWER EXTREMITY DOPPLER STUDY Patient Name:  Martin Adkins  Date of Exam:   09/29/2023 Medical Rec #: [id number]          Accession #:    [id number] Date of Birth: Jan 28, 1945         Patient Gender: M Patient Age:   49 years Exam Location:  [facility] Procedure:      VAS US  ABI WITH/WO TBI Referring Phys: Selinda Gu --------------------------------------------------------------------------------  Indications: Peripheral artery disease. High Risk Factors: Hypertension, prior MI, coronary artery disease.  Vascular Interventions: EVAR 09/24/2017. Comparison Study: 09/27/2022 Performing Technologist: Leafy Gibes RVS  Examination Guidelines: A complete evaluation includes at minimum, Doppler waveform signals and systolic blood pressure reading at the level of bilateral brachial, anterior tibial, and posterior tibial arteries,  when vessel segments are accessible. Bilateral testing is considered an integral part of a complete examination. Photoelectric Plethysmograph (PPG) waveforms and toe systolic pressure readings are included as required and additional duplex testing as needed. Limited examinations for reoccurring indications may be performed as noted.  ABI Findings: +---------+------------------+-----+---------+--------+ Right    Rt Pressure (mmHg)IndexWaveform Comment  +---------+------------------+-----+---------+--------+ Brachial 134                                      +---------+------------------+-----+---------+--------+ ATA      228               1.70 triphasicNC       +---------+------------------+-----+---------+--------+ PTA      154               1.15 biphasic          +---------+------------------+-----+---------+--------+ [place]               1.02 Normal            +---------+------------------+-----+---------+--------+ +---------+------------------+-----+---------+-------+ Left     Lt Pressure (mmHg)IndexWaveform Comment +---------+------------------+-----+---------+-------+ Brachial 132                                     +---------+------------------+-----+---------+-------+ ATA      236               1.76 triphasicNC      +---------+------------------+-----+---------+-------+ PTA      147               1.10  triphasic        +---------+------------------+-----+---------+-------+ [place]               1.03 Normal           +---------+------------------+-----+---------+-------+ +-------+-----------+-----------+------------+------------+ ABI/TBIToday's ABIToday's TBIPrevious ABIPrevious TBI +-------+-----------+-----------+------------+------------+ Right  >1.0 [place]    1.02       [place]          .91          +-------+-----------+-----------+------------+------------+ Left   >1.0 [place]    1.03       [place]          .70           +-------+-----------+-----------+------------+------------+  Bilateral ABIs appear essentially unchanged compared to prior study on 09/27/2022. Bilateral TBIs appear increased compared to prior study on 09/27/2022.  Summary: Right: Resting right ankle-brachial index indicates noncompressible right lower extremity arteries. The right toe-brachial index is normal.  Left: Resting left ankle-brachial index indicates noncompressible left lower extremity arteries. The left toe-brachial index is normal.  *See table(s) above for measurements and observations.  Electronically signed by Selinda Gu MD on 10/03/2023 at 7:17:45 AM.    Final    NM PET CT CARDIAC PERFUSION MULTI W/ABSOLUTE BLOODFLOW Result Date: 09/18/2023   LV perfusion is abnormal. There is no evidence of ischemia. There is evidence of infarction. Large area of severe intensity, fixed perfusion defect in basal to mid inferolateral/anterolateral and inferior wall suggestive of prior infarction in LCX/RCA territory with no ischemia.   Rest left ventricular function is abnormal. Rest global function is mildly reduced. Rest EF: 43%. Stress left ventricular function is abnormal. Stress global function is mildly reduced. Stress EF: 42%. No significant TID.   Myocardial blood flow reserve is not reported in this patient due to technical or patient-specific concerns that affect accuracy.   Coronary calcium  assessment not performed due to prior revascularization.   Findings are consistent with infarction. The study is high risk. CLINICAL DATA:  This over-read does not include interpretation of cardiac or coronary anatomy or pathology. The Cardiac PET CT interpretation by the cardiologist is attached. COMPARISON:  10/11/2022 lung cancer screening CT FINDINGS: No pleural fluid. Inferior right upper lobe it airspace disease on 08/04 is new, likely sequelae of right upper lobe pneumonia on 08/09/2023. Emphysema. Ascending aortic dilatation at 4.1 cm is unchanged. Aortic  atherosclerosis. Tortuous thoracic aorta. Pulmonary artery enlargement, outflow tract 3.1 cm. No imaged thoracic adenopathy. Cholecystectomy. Normal imaged portions of the liver, spleen, stomach, adrenal glands, right kidney. No acute osseous abnormality. IMPRESSION: New inferior right upper lobe airspace disease, likely a resolving pneumonia or developing scar when compared to 08/09/2023 chest radiograph. Aortic atherosclerosis (ICD10-I70.0) and emphysema (ICD10-J43.9). Ascending aortic dilatation at 4.1 cm, as before. This can be re-evaluated on routine lung cancer screening CT on or after 10/12/2023. Pulmonary artery enlargement suggests pulmonary arterial hypertension. Electronically Signed   By: Rockey Kilts M.D.   On: 09/18/2023 14:10  MR CARDIAC MORPHOLOGY W WO CONTRAST Result Date: 09/15/2023 CLINICAL DATA:  Evaluate for viability EXAM: CARDIAC MRI TECHNIQUE: The patient was scanned on a 1.5 Tesla Siemens magnet. A dedicated cardiac coil was used. Functional imaging was done using Fiesta sequences. 2,3, and 4 chamber views were done to assess for RWMA's. Modified Simpson's rule using a short axis stack was used to calculate an ejection fraction on a dedicated work [employer]. The patient received 12 cc of Gadavist . After 10 minutes inversion recovery sequences were used to assess  for infiltration and scar tissue. Velocity flow mapping performed in the ascending aorta and main pulmonary artery. CONTRAST:  12 cc  of Gadavist  FINDINGS: 1. Normal left ventricular size and thickness. Moderately reduced LV systolic function (LVEF = 38%). Inferolateral and lateral LV wall akinesis noted There is near transmural late gadolinium enhancement in the basal-mid inferolateral and basal-mid lateral walls of the left ventricular myocardium. LVEDV: 146 ml LVESV: 91 ml SV: 56 ml CO: 4.4 L/min Myocardial mass: 125 g 2. Normal right ventricular size, thickness and systolic function (RVEF =). There are no  regional wall motion abnormalities. 3.  Normal left and right atrial size. 4. Normal size of the aortic root, ascending aorta and pulmonary artery. 5.  No significant valvular abnormalities. 6.  Normal pericardium.  No pericardial effusion. IMPRESSION: 1.  Moderately reduced LV systolic function.  LVEF 38%. 2. Near transmural LGE/scar involving the basal-mid LV inferolateral and lateral walls. 3.  Inferolateral and lateral LV wall akinesis noted. 4.  Normal RV systolic function. 5.  Inferolateral and lateral LV walls are not viable. 6.  LV anterior and septal walls appear viable. Electronically Signed   By: Redell Cave M.D.   On: 09/15/2023 16:06   MR CARDIAC VELOCITY FLOW MAP Result Date: 09/15/2023 CLINICAL DATA:  Evaluate for viability EXAM: CARDIAC MRI TECHNIQUE: The patient was scanned on a 1.5 Tesla Siemens magnet. A dedicated cardiac coil was used. Functional imaging was done using Fiesta sequences. 2,3, and 4 chamber views were done to assess for RWMA's. Modified Simpson's rule using a short axis stack was used to calculate an ejection fraction on a dedicated work [employer]. The patient received 12 cc of Gadavist . After 10 minutes inversion recovery sequences were used to assess for infiltration and scar tissue. Velocity flow mapping performed in the ascending aorta and main pulmonary artery. CONTRAST:  12 cc  of Gadavist  FINDINGS: 1. Normal left ventricular size and thickness. Moderately reduced LV systolic function (LVEF = 38%). Inferolateral and lateral LV wall akinesis noted There is near transmural late gadolinium enhancement in the basal-mid inferolateral and basal-mid lateral walls of the left ventricular myocardium. LVEDV: 146 ml LVESV: 91 ml SV: 56 ml CO: 4.4 L/min Myocardial mass: 125 g 2. Normal right ventricular size, thickness and systolic function (RVEF =). There are no regional wall motion abnormalities. 3.  Normal left and right atrial size. 4. Normal size of the  aortic root, ascending aorta and pulmonary artery. 5.  No significant valvular abnormalities. 6.  Normal pericardium.  No pericardial effusion. IMPRESSION: 1.  Moderately reduced LV systolic function.  LVEF 38%. 2. Near transmural LGE/scar involving the basal-mid LV inferolateral and lateral walls. 3.  Inferolateral and lateral LV wall akinesis noted. 4.  Normal RV systolic function. 5.  Inferolateral and lateral LV walls are not viable. 6.  LV anterior and septal walls appear viable. Electronically Signed   By: Redell Cave M.D.   On: 09/15/2023 16:06   MR CARDIAC VELOCITY FLOW MAP Result Date: 09/15/2023 CLINICAL DATA:  Evaluate for viability EXAM: CARDIAC MRI TECHNIQUE: The patient was scanned on a 1.5 Tesla Siemens magnet. A dedicated cardiac coil was used. Functional imaging was done using Fiesta sequences. 2,3, and 4 chamber views were done to assess for RWMA's. Modified Simpson's rule using a short axis stack was used to calculate an ejection fraction on a dedicated work [employer]. The patient received 12 cc of Gadavist . After 10 minutes inversion recovery sequences were used  to assess for infiltration and scar tissue. Velocity flow mapping performed in the ascending aorta and main pulmonary artery. CONTRAST:  12 cc  of Gadavist  FINDINGS: 1. Normal left ventricular size and thickness. Moderately reduced LV systolic function (LVEF = 38%). Inferolateral and lateral LV wall akinesis noted There is near transmural late gadolinium enhancement in the basal-mid inferolateral and basal-mid lateral walls of the left ventricular myocardium. LVEDV: 146 ml LVESV: 91 ml SV: 56 ml CO: 4.4 L/min Myocardial mass: 125 g 2. Normal right ventricular size, thickness and systolic function (RVEF =). There are no regional wall motion abnormalities. 3.  Normal left and right atrial size. 4. Normal size of the aortic root, ascending aorta and pulmonary artery. 5.  No significant valvular abnormalities. 6.   Normal pericardium.  No pericardial effusion. IMPRESSION: 1.  Moderately reduced LV systolic function.  LVEF 38%. 2. Near transmural LGE/scar involving the basal-mid LV inferolateral and lateral walls. 3.  Inferolateral and lateral LV wall akinesis noted. 4.  Normal RV systolic function. 5.  Inferolateral and lateral LV walls are not viable. 6.  LV anterior and septal walls appear viable. Electronically Signed   By: Redell Cave M.D.   On: 09/15/2023 16:06   DG Chest 2 View Result Date: 08/09/2023 EXAM: 2 VIEW(S) XRAY OF THE CHEST 08/09/2023 05:48:01 PM COMPARISON: None available. CLINICAL HISTORY: SOB. PT to ED for SOB, body aches, fever since 2-3 days ago. Temp was 104 today, took tylenol . Seen at [facility] yesterday for same, prescribed PO abx and had negative covid test. Temp is 99.4 in triage. States temp goes down after tylenol  then goes back up. Alert and oriented. Blue top and lactic sent with labs. FINDINGS: LUNGS AND PLEURA: Dense airspace consolidation in the anterior right upper lobe, new since previous. Left lung clear. No pleural effusion. No pneumothorax. HEART AND MEDIASTINUM: Post CABG. Sternotomy wires. BONES AND SOFT TISSUES: No acute osseous abnormality. IMPRESSION: 1. Anterior right upper lobe pneumonia 2. Left lung clear. 3. No effusion. Electronically signed by: Dayne Hassell MD 08/09/2023 06:01 PM EDT RP Workstation: [id number]

## 2023-10-13 NOTE — Assessment & Plan Note (Addendum)
 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  I clarified with radiology Dr. Marthann.  An addendum was added that the T staging [ post resection] should be T0, no muscularis propia invasion.  10/2020 Flex sigmoidoscopy by Dr.Mansouraty did not reveal significant pathology at the rectosigmoid junction.  Flex sigmoidoscopy in Oct 2023-report not available to me. Check CT abdomen pelvis with contrast annually-next due Oct 2025 CT. Continue follow up with GI   Previous chromogranin A was elevated likely due to being on PPI.  He has tried to hold off PPI and take Tums prior to testing which did not work for him. I will hold off rechecking chromogranin A.

## 2023-10-13 NOTE — Assessment & Plan Note (Signed)
#  CLL, stable.  Patient has been in remission.  Off treatment since November 2019. Lymphocytosis 4.7, Early recurrence. He is asymptomatic without constitutional symptoms.. Continue watchful observation.

## 2023-10-14 DIAGNOSIS — G4733 Obstructive sleep apnea (adult) (pediatric): Secondary | ICD-10-CM | POA: Diagnosis not present

## 2023-10-14 DIAGNOSIS — I714 Abdominal aortic aneurysm, without rupture, unspecified: Secondary | ICD-10-CM | POA: Diagnosis not present

## 2023-10-14 DIAGNOSIS — E119 Type 2 diabetes mellitus without complications: Secondary | ICD-10-CM | POA: Diagnosis not present

## 2023-10-14 DIAGNOSIS — E782 Mixed hyperlipidemia: Secondary | ICD-10-CM | POA: Diagnosis not present

## 2023-10-14 DIAGNOSIS — I5022 Chronic systolic (congestive) heart failure: Secondary | ICD-10-CM | POA: Diagnosis not present

## 2023-10-14 DIAGNOSIS — I10 Essential (primary) hypertension: Secondary | ICD-10-CM | POA: Diagnosis not present

## 2023-10-14 DIAGNOSIS — I739 Peripheral vascular disease, unspecified: Secondary | ICD-10-CM | POA: Diagnosis not present

## 2023-10-14 DIAGNOSIS — Z720 Tobacco use: Secondary | ICD-10-CM | POA: Diagnosis not present

## 2023-10-14 DIAGNOSIS — I25810 Atherosclerosis of coronary artery bypass graft(s) without angina pectoris: Secondary | ICD-10-CM | POA: Diagnosis not present

## 2023-10-15 LAB — COMP PANEL: LEUKEMIA/LYMPHOMA: Immunophenotypic Profile: 29

## 2023-10-20 ENCOUNTER — Ambulatory Visit
Admission: RE | Admit: 2023-10-20 | Discharge: 2023-10-20 | Disposition: A | Discharge status: Home / Self Care | Source: Ambulatory Visit | Attending: Oncology | Admitting: Oncology

## 2023-10-20 DIAGNOSIS — I723 Aneurysm of iliac artery: Secondary | ICD-10-CM | POA: Diagnosis not present

## 2023-10-20 DIAGNOSIS — C7A8 Other malignant neuroendocrine tumors: Secondary | ICD-10-CM | POA: Insufficient documentation

## 2023-10-20 DIAGNOSIS — C9110 Chronic lymphocytic leukemia of B-cell type not having achieved remission: Secondary | ICD-10-CM | POA: Diagnosis not present

## 2023-10-20 MED ORDER — IOHEXOL 300 MG/ML  SOLN
100.0000 mL | Freq: Once | INTRAMUSCULAR | Status: AC | PRN
  Administered 2023-10-20: 100 mL via INTRAVENOUS

## 2023-10-20 MED ORDER — BARIUM SULFATE 2 % PO SUSP
450.0000 mL | ORAL | Status: AC
  Administered 2023-10-20 (×2): 450 mL via ORAL

## 2023-10-31 DIAGNOSIS — I5022 Chronic systolic (congestive) heart failure: Secondary | ICD-10-CM | POA: Diagnosis not present

## 2023-10-31 DIAGNOSIS — I25810 Atherosclerosis of coronary artery bypass graft(s) without angina pectoris: Secondary | ICD-10-CM | POA: Diagnosis not present

## 2023-10-31 DIAGNOSIS — Z720 Tobacco use: Secondary | ICD-10-CM | POA: Diagnosis not present

## 2023-10-31 DIAGNOSIS — I10 Essential (primary) hypertension: Secondary | ICD-10-CM | POA: Diagnosis not present

## 2023-11-10 DIAGNOSIS — R760 Raised antibody titer: Secondary | ICD-10-CM | POA: Diagnosis not present

## 2023-11-10 DIAGNOSIS — M159 Polyosteoarthritis, unspecified: Secondary | ICD-10-CM | POA: Diagnosis not present

## 2023-11-14 DIAGNOSIS — I5022 Chronic systolic (congestive) heart failure: Secondary | ICD-10-CM | POA: Diagnosis not present

## 2023-11-20 ENCOUNTER — Other Ambulatory Visit: Payer: Self-pay

## 2023-11-20 ENCOUNTER — Emergency Department

## 2023-11-20 ENCOUNTER — Emergency Department
Admission: EM | Admit: 2023-11-20 | Discharge: 2023-11-20 | Disposition: A | Discharge status: Home / Self Care | Attending: Emergency Medicine | Admitting: Emergency Medicine

## 2023-11-20 DIAGNOSIS — I10 Essential (primary) hypertension: Secondary | ICD-10-CM | POA: Diagnosis not present

## 2023-11-20 DIAGNOSIS — R519 Headache, unspecified: Secondary | ICD-10-CM | POA: Insufficient documentation

## 2023-11-20 DIAGNOSIS — Z86718 Personal history of other venous thrombosis and embolism: Secondary | ICD-10-CM | POA: Insufficient documentation

## 2023-11-20 DIAGNOSIS — I5020 Unspecified systolic (congestive) heart failure: Secondary | ICD-10-CM | POA: Diagnosis not present

## 2023-11-20 DIAGNOSIS — R42 Dizziness and giddiness: Secondary | ICD-10-CM | POA: Diagnosis not present

## 2023-11-20 DIAGNOSIS — M542 Cervicalgia: Secondary | ICD-10-CM | POA: Diagnosis not present

## 2023-11-20 DIAGNOSIS — G8929 Other chronic pain: Secondary | ICD-10-CM | POA: Diagnosis not present

## 2023-11-20 DIAGNOSIS — Z7901 Long term (current) use of anticoagulants: Secondary | ICD-10-CM | POA: Insufficient documentation

## 2023-11-20 DIAGNOSIS — I6782 Cerebral ischemia: Secondary | ICD-10-CM | POA: Diagnosis not present

## 2023-11-20 DIAGNOSIS — I2510 Atherosclerotic heart disease of native coronary artery without angina pectoris: Secondary | ICD-10-CM | POA: Diagnosis not present

## 2023-11-20 LAB — URINALYSIS, ROUTINE W REFLEX MICROSCOPIC
Bacteria, UA: NONE SEEN
Bilirubin Urine: NEGATIVE
Glucose, UA: >=500 mg/dL — AB
Hgb urine dipstick: NEGATIVE
Ketones, ur: NEGATIVE mg/dL
Leukocytes,Ua: NEGATIVE
Nitrite: NEGATIVE
Protein, ur: 100 mg/dL — AB
RBC / HPF: 0 RBC/hpf (ref 0–5)
Specific Gravity, Urine: 1.027 (ref 1.005–1.030)
pH: 5 (ref 5.0–8.0)

## 2023-11-20 LAB — CBC
HCT: 51.2 % (ref 39.0–52.0)
Hemoglobin: 16.6 g/dL (ref 13.0–17.0)
MCH: 30.4 pg (ref 26.0–34.0)
MCHC: 32.4 g/dL (ref 30.0–36.0)
MCV: 93.8 fL (ref 80.0–100.0)
Platelets: 128 K/uL — ABNORMAL LOW (ref 150–400)
RBC: 5.46 MIL/uL (ref 4.22–5.81)
RDW: 15.1 % (ref 11.5–15.5)
WBC: 8.4 K/uL (ref 4.0–10.5)
nRBC: 0 % (ref 0.0–0.2)

## 2023-11-20 LAB — COMPREHENSIVE METABOLIC PANEL WITH GFR
ALT: 27 U/L (ref 0–44)
AST: 29 U/L (ref 15–41)
Albumin: 4.6 g/dL (ref 3.5–5.0)
Alkaline Phosphatase: 99 U/L (ref 38–126)
Anion gap: 12 (ref 5–15)
BUN: 14 mg/dL (ref 8–23)
CO2: 25 mmol/L (ref 22–32)
Calcium: 9 mg/dL (ref 8.9–10.3)
Chloride: 105 mmol/L (ref 98–111)
Creatinine, Ser: 0.87 mg/dL (ref 0.61–1.24)
GFR, Estimated: >60 mL/min (ref >60)
Glucose, Bld: 157 mg/dL — ABNORMAL HIGH (ref 70–99)
Potassium: 3.7 mmol/L (ref 3.5–5.1)
Sodium: 142 mmol/L (ref 135–145)
Total Bilirubin: 1.4 mg/dL — ABNORMAL HIGH (ref 0.0–1.2)
Total Protein: 6.9 g/dL (ref 6.5–8.1)

## 2023-11-20 LAB — SEDIMENTATION RATE: Sed Rate: 1 mm/h (ref 0–20)

## 2023-11-20 NOTE — ED Notes (Signed)
 Pt given water after passing swallow screen with okay from Dicky, MD

## 2023-11-20 NOTE — ED Provider Notes (Signed)
-----------------------------------------   5:37 PM on 11/20/2023 ----------------------------------------- Patient care assumed from Dr. Dicky.  Patient's workup today shows a reassuring CBC reassuring chemistry negative ESR.  Urinalysis shows no sign of urinary tract infection.  Patient's MRI has now resulted showing no acute intracranial abnormality.  Given the patient's reassuring workup we will discharge home with outpatient follow-up.  I have updated the patient he is agreeable to this plan.   Dorothyann Drivers, MD 11/20/23 [phone]

## 2023-11-20 NOTE — ED Notes (Signed)
 Pt given a lunch box and beverage with okay from Dicky, MD

## 2023-11-20 NOTE — ED Triage Notes (Addendum)
 Patient to ED via [facility] for dizziness and headache x one month; but today it was worse. States he went to bed to bed at 2030 last night with headache and bilateral blurred vision and woke up with the same. Reports when he got out of bed he felt very unbalanced but didn't fall.

## 2023-11-20 NOTE — ED Provider Notes (Signed)
 [facility] Provider Note    Event Date/Time   First MD Initiated Contact with Patient 11/20/23 1519     (approximate)   History   Dizziness   HPI  Martin Adkins is a 78 y.o. male  atrial fibrillation, BPH, coronary artery disease, cardiomyopathy, CLL, DVT on Xarelto , systolic CHF, dyslipidemia, coronary artery disease status    Patient tells me that for over a month or more now he has had this kind of daily headache.  That has been no different today.  It is been persistent for well over a month Agbata, and he tells me has been seen evaluated for this by doctors including vascular surgery etc.  Also for several months he has had pain at the base of his left skull, he has been evaluated by vascular surgery and reports he had ultrasounds and tests done on his neck just recently.  He has been told that he had couple small strokes in the past.  Last night he went to bed normally and felt well except for the chronic headache at about 830.  This morning he woke up, not exactly certain of the time but about 7 AM and when he woke up he felt dizzy.  Reports that he felt dizzy when he got up and it since seems to have gone away.  He felt like his vision was slightly fuzzy but when he puts his glasses on that goes away.  He continues to have this odd sort of throbbing mild headache now for over a month.  That has not changed.  Denies eye pain.  Vision is fine when he puts his glasses on  Reports when he got up he felt very unbalanced.  Sort of like he had lost his ability to balance normally.  He reports that he did not feel the spinning but more of an imbalance feeling and it now seems to have gotten better.  No chest pain no shortness of breath.  No fevers or chills.  No numbness or weakness but had some tingling both his lower legs for quite some time now      Physical Exam   Triage Vital Signs: ED Triage Vitals  Encounter Vitals Group     BP 11/20/23 1022  (!) 168/99     Girls Systolic BP Percentile --      Girls Diastolic BP Percentile --      Boys Systolic BP Percentile --      Boys Diastolic BP Percentile --      Pulse Rate 11/20/23 1022 73     Resp 11/20/23 1022 20     Temp 11/20/23 1022 98.4 F (36.9 C)     Temp Source 11/20/23 1022 Oral     SpO2 11/20/23 1022 99 %     Weight 11/20/23 1019 208 lb (94.3 kg)     Height 11/20/23 1019 6' (1.829 m)     Head Circumference --      Peak Flow --      Pain Score 11/20/23 1359 5     Pain Loc --      Pain Education --      Exclude from Growth Chart --     Most recent vital signs: Vitals:   11/20/23 1358 11/20/23 1358  BP: (!) 145/71   Pulse: (!) 56   Resp: 18   Temp: 97.8 F (36.6 C)   SpO2: 97% 97%     General: Awake, no distress.  Pleasant resting  comfortably in hallway normocephalic atraumatic moves all extremities well no pronator drift extremity 5 out of 5 strength all extremities.  Extraocular movements are normal no conjunctival injection.  Pupils reactive.  He has no appreciable temporal artery tenderness, he has bilateral normal pulsations in the temporal arteries. CV:  Good peripheral perfusion.  Normal tones occasional irregularity Resp:  Normal effort.  Clear bilateral Abd:  No distention.  Other:  Moves all extremities well 5-5 strength.  Normal facial expressions and extraocular movements.  Reports vision blurry easier earlier but this goes away when he wears his glasses.  Denies any visual deficits glasses on.  Each is very clear no facial droop   ED Results / Procedures / Treatments   Labs (all labs ordered are listed, but only abnormal results are displayed) Labs Reviewed  COMPREHENSIVE METABOLIC PANEL WITH GFR - Abnormal; Notable for the following components:      Result Value   Glucose, Bld 157 (*)    Total Bilirubin 1.4 (*)    All other components within normal limits  CBC - Abnormal; Notable for the following components:   Platelets 128 (*)    All other  components within normal limits  URINALYSIS, ROUTINE W REFLEX MICROSCOPIC - Abnormal; Notable for the following components:   Color, Urine YELLOW (*)    APPearance CLEAR (*)    Glucose, UA >=500 (*)    Protein, ur 100 (*)    All other components within normal limits  SEDIMENTATION RATE  CBG MONITORING, ED     EKG  And inter by me at 1030 heart rate 70 QRS 80 QTc 460, normal sinus rhythm with occasional PVC.  No evidence of frank ischemia.  No evidence of heart block.   RADIOLOGY  CT HEAD WO CONTRAST ( ) Result Date: 11/20/2023 EXAM: CT HEAD WITHOUT CONTRAST 11/20/2023 10:55:27 AM TECHNIQUE: CT of the head was performed without the administration of intravenous contrast. Automated exposure control, iterative reconstruction, and/or weight based adjustment of the mA/kV was utilized to reduce the radiation dose to as low as reasonably achievable. COMPARISON: Head CT 08/30/2021 and MRI 09/03/2022. CLINICAL HISTORY: Syncope/presyncope, cerebrovascular cause suspected, headache, blurred vision. FINDINGS: BRAIN AND VENTRICLES: There is no evidence of an acute infarct, intracranial hemorrhage, mass, midline shift, hydrocephalus, or extra-axial fluid collection. There is mild cerebral atrophy. Patchy hypodensities in the cerebral white matter are similar to the prior CT and nonspecific but compatible with mild to moderate chronic small vessel ischemic disease. 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 Calcified atherosclerosis at the skull base. ORBITS: No acute abnormality. SINUSES: No acute abnormality. SOFT TISSUES AND SKULL: No acute soft tissue abnormality. No skull fracture. IMPRESSION: 1. No acute intracranial abnormality. 2. Mild to moderate chronic small vessel ischemic disease. Electronically signed by: Dasie Hamburg MD 11/20/2023 11:16 AM EST RP Workstation: [id number]      PROCEDURES:  Critical Care performed: No  Procedures   MEDICATIONS ORDERED IN  ED: Medications - No data to display   IMPRESSION / MDM / ASSESSMENT AND PLAN / ED COURSE  I reviewed the triage vital signs and the nursing notes.                              Differential diagnosis includes, but is not limited to, possible symptoms like vertigo although it does not seem to be that of a spinning feeling at this point, reports a sort of hard to pin  down slightly vague feeling of lightheadedness and feeling off balance this morning but is gotten better.  He relates a headache that is been fairly chronic and he reports he is seen and evaluated for that does not have any fevers there is no obvious focal neurologic deficits.  He does not have any ataxia on exam his neurologic exam sensation in extremities speech etc. are all clear.  Has been having neck pain now for months, reports has been worked up and review of vascular notes does indicate he has had evaluation of the carotids without acute concern.  Patient's presentation is most consistent with acute complicated illness / injury requiring diagnostic workup.   He does not have any acute palpitation.  Does have PVCs notable on his ECG but also had office visit and previous evaluation is noting history of PVCs, frequent PVCs and visit from October of this year also noted.  Reassuring exam.  Given associated headache, global headache no associated eye pain disseminate findings would suggest acute glaucoma his blood pressure is relatively well-controlled I do not think this would be from a hypertensive emergency perspective.  He is anticoagulated making stroke risk relatively low, peripheral vertigo would be a consideration as he reports he does have a history of that in the distant past etc.   Clinical Course as of 11/20/23 1612  Thu Nov 20, 2023  1446 11/14/2023 vascular notes 'Carotid duplex today shows no significant carotid artery occlusive disease in either carotid artery.' [MQ]    Clinical Course User Index [MQ] Dicky Anes, MD   ESR added to screen for arteritis or giant cell though it seems atypical rash picture and exam seems to argue against.  If ESR greater than 40 this could be entertained.  Patient already following with neurology regarding history of migraines, denies significant change in his typical daily headache on today's presentation.  There is no meningismus.  No fever  Ongoing care signed Dr. Dorothyann Follow-up on MRI, ESR  FINAL CLINICAL IMPRESSION(S) / ED DIAGNOSES   Final diagnoses:  Episode of dizziness  Chronic nonintractable headache, unspecified headache type     Rx / DC Orders   ED Discharge Orders     None        Note:  This document was prepared using Dragon voice recognition software and may include unintentional dictation errors.   Dicky Anes, MD 11/20/23 [phone]

## 2023-11-20 NOTE — ED Notes (Signed)
 Pt to MRI

## 2023-11-26 DIAGNOSIS — R519 Headache, unspecified: Secondary | ICD-10-CM | POA: Diagnosis not present

## 2023-11-26 DIAGNOSIS — R42 Dizziness and giddiness: Secondary | ICD-10-CM | POA: Diagnosis not present

## 2023-12-01 ENCOUNTER — Other Ambulatory Visit: Payer: Self-pay | Admitting: Nurse Practitioner

## 2023-12-01 ENCOUNTER — Encounter: Payer: Self-pay | Admitting: Nurse Practitioner

## 2023-12-01 DIAGNOSIS — G4733 Obstructive sleep apnea (adult) (pediatric): Secondary | ICD-10-CM | POA: Diagnosis not present

## 2023-12-01 DIAGNOSIS — I5022 Chronic systolic (congestive) heart failure: Secondary | ICD-10-CM | POA: Diagnosis not present

## 2023-12-01 DIAGNOSIS — I25810 Atherosclerosis of coronary artery bypass graft(s) without angina pectoris: Secondary | ICD-10-CM | POA: Diagnosis not present

## 2023-12-01 DIAGNOSIS — I7121 Aneurysm of the ascending aorta, without rupture: Secondary | ICD-10-CM | POA: Diagnosis not present

## 2023-12-01 DIAGNOSIS — I1A0 Resistant hypertension: Secondary | ICD-10-CM | POA: Diagnosis not present

## 2023-12-01 DIAGNOSIS — I739 Peripheral vascular disease, unspecified: Secondary | ICD-10-CM | POA: Diagnosis not present

## 2023-12-01 DIAGNOSIS — I82412 Acute embolism and thrombosis of left femoral vein: Secondary | ICD-10-CM | POA: Diagnosis not present

## 2023-12-01 DIAGNOSIS — I480 Paroxysmal atrial fibrillation: Secondary | ICD-10-CM | POA: Diagnosis not present

## 2023-12-01 DIAGNOSIS — I714 Abdominal aortic aneurysm, without rupture, unspecified: Secondary | ICD-10-CM | POA: Diagnosis not present

## 2023-12-02 ENCOUNTER — Encounter (INDEPENDENT_AMBULATORY_CARE_PROVIDER_SITE_OTHER): Payer: Self-pay | Admitting: Vascular Surgery

## 2023-12-02 ENCOUNTER — Ambulatory Visit (INDEPENDENT_AMBULATORY_CARE_PROVIDER_SITE_OTHER): Admitting: Vascular Surgery

## 2023-12-02 VITALS — BP 147/85 | HR 65 | Resp 18 | Wt 200.6 lb

## 2023-12-02 DIAGNOSIS — I7121 Aneurysm of the ascending aorta, without rupture: Secondary | ICD-10-CM

## 2023-12-02 DIAGNOSIS — E1151 Type 2 diabetes mellitus with diabetic peripheral angiopathy without gangrene: Secondary | ICD-10-CM

## 2023-12-02 DIAGNOSIS — I739 Peripheral vascular disease, unspecified: Secondary | ICD-10-CM | POA: Diagnosis not present

## 2023-12-02 DIAGNOSIS — I10 Essential (primary) hypertension: Secondary | ICD-10-CM | POA: Diagnosis not present

## 2023-12-02 DIAGNOSIS — I712 Thoracic aortic aneurysm, without rupture, unspecified: Secondary | ICD-10-CM | POA: Insufficient documentation

## 2023-12-02 DIAGNOSIS — I7143 Infrarenal abdominal aortic aneurysm, without rupture: Secondary | ICD-10-CM | POA: Diagnosis not present

## 2023-12-02 NOTE — Assessment & Plan Note (Signed)
 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  This is below the size for prophylactic repair, but it will need to be monitored.  I will plan to get a CT of the chest when he is scheduled to come back for his ABIs and abdominal studies next September.  We can go ahead and just get a CT scan of the chest abdomen and pelvis to evaluate all the aneurysmal issues at 1 time.  We are still getting ABIs to check his PAD.  He voices understanding and is agreeable with our plan of care.

## 2023-12-02 NOTE — Assessment & Plan Note (Signed)
 Stable at last check.  To be checked again next year.

## 2023-12-02 NOTE — Assessment & Plan Note (Signed)
 Status post endovascular repair.  We will get CT of the chest abdomen pelvis next September.

## 2023-12-02 NOTE — Progress Notes (Signed)
 MRN : [id number]  Martin MARRAZZO is a 78 y.o. (November 06, 1945) male who presents with chief complaint of  Chief Complaint  Patient presents with   Follow-up    Ref [facility] consult CT done 11/20/23. expanding  ascending thoracic aneurysm  .  History of Present Illness: Patient returns today in follow up earlier than scheduled appointment secondary to a recently diagnosed ascending thoracic aortic aneurysm.  He has a known abdominal aortic aneurysm and iliac artery aneurysms that we have previously treated.  He has some degree of iliac artery aneurysm below the stent graft that we are following.  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  We recently saw him about 2 months ago for his abdominal aortic aneurysm and iliac artery aneurysms which were stable.  Current Outpatient Medications  Medication Sig Dispense Refill   acetaminophen  (TYLENOL ) 325 MG tablet Take 2 tablets (650 mg total) by mouth every 6 (six) hours as needed for mild pain (pain score 1-3) or fever (or Fever >/= 101). 10 tablet 0   albuterol  (VENTOLIN  HFA) 108 (90 Base) MCG/ACT inhaler SMARTSIG:2 inhalation Via Inhaler Every 6 Hours PRN     amLODipine  (NORVASC ) 10 MG tablet Take 10 mg by mouth daily.  (Patient taking differently: Take 2.5 mg by mouth daily.)     atorvastatin  (LIPITOR ) 80 MG tablet Take 80 mg by mouth daily.   11   Cholecalciferol  (VITAMIN D3) 50 MCG (2000 UT) CAPS Take 2,000 Units/day by mouth daily.     fluticasone  (FLONASE ) 50 MCG/ACT nasal spray Place 2 sprays into both nostrils daily as needed for allergies.     gabapentin  (NEURONTIN ) 300 MG capsule Take 1 capsule (300 mg total) by mouth 3 (three) times daily. (Patient taking differently: Take 600 mg by mouth 2 (two) times daily.) 90 capsule 0   guaiFENesin  (ROBITUSSIN) 100 MG/5ML liquid Take 5 mLs by mouth every 4 (four) hours as needed for cough or to loosen phlegm. 120 mL 0    [Paused] isosorbide  mononitrate (IMDUR ) 60 MG 24 hr tablet Take 1 tablet (60 mg total) by mouth daily. 30 tablet 0   JARDIANCE 10 MG TABS tablet Take 10 mg by mouth daily.     latanoprost  (XALATAN ) 0.005 % ophthalmic solution Place 1 drop into both eyes at bedtime.      [Paused] losartan  (COZAAR ) 25 MG tablet Take 25 mg by mouth daily.     methocarbamol  (ROBAXIN ) 500 MG tablet Take by mouth.     metoprolol  succinate (TOPROL -XL) 100 MG 24 hr tablet Take 0.5 tablets (50 mg total) by mouth daily. 30 tablet 0   Multiple Vitamins-Minerals (MULTIVITAMIN MEN 50+ PO) Take 1 tablet by mouth daily.     pantoprazole  (PROTONIX ) 40 MG tablet Take 1 tablet (40 mg total) by mouth 2 (two) times daily before a meal. 60 tablet 6   pantoprazole  (PROTONIX ) 40 MG tablet Take 40 mg by mouth daily.     potassium chloride  SA (K-DUR,KLOR-CON ) 20 MEQ tablet Take 20 mEq by mouth daily.      rivaroxaban  (XARELTO ) 20 MG TABS tablet Take 1 tablet (20 mg total) by mouth daily with supper.     sacubitril-valsartan (ENTRESTO) 49-51 MG Take 1 tablet by mouth 2 (two) times daily.     spironolactone (ALDACTONE) 25 MG tablet Take 25 mg by mouth daily.     tiZANidine (ZANAFLEX) 2 MG tablet Take between 1mg  (half  a tablet) and 4mg  (two tablets) up to twice daily as needed for musculoskeletal pain.     TRELEGY ELLIPTA 100-62.5-25 MCG/ACT AEPB Inhale 1 puff into the lungs daily.     DULoxetine (CYMBALTA) 30 MG capsule Take by mouth. (Patient not taking: Reported on 12/02/2023)     No current facility-administered medications for this visit.    Past Medical History:  Diagnosis Date   AAA (abdominal aortic aneurysm) without rupture    a.) US  02/09/04 - 3.1 x 3.4 cm. b.) US  02/14/05 - 3.62 x 3.63 cm. c.) US  02/25/06 - 3.97 x 4.0 cm. d.) CT 07/09/11 - 4.9 cm. e.) s/p EVAR 2013. f.) enlarging AAA --> back to the OR on 09/24/2017 for placement of a 27 mm x 12 cm RIGHT iliac extension limb down to distal CIA.   Aortic atherosclerosis     APS (antiphospholipid syndrome)    Arthritis    Atrial fibrillation (HCC)    a.) CHA2DS2-VASc Score = 6 (age, CHF, HTN, prior DVT x 2, prior MI). b.) on rivaroxaban    B12 deficiency 03/21/2017   Barrett's esophagus    BPH (benign prostatic hyperplasia)    CAD (coronary artery disease)    a.) LHC 11/14/95 --> 95% mLCx, 95% OM1; PTCA. b.) 4v CABG 2004 --> LIMA-LAD, SVG-D1,RCA,OM. c.) NSTEMI 05/02/2013 --> PCI with DES x 3 to SVG-OM in 2 distinct areas with filter. d.) Inf STEMI 07/30/14 - LHC --> 50% LM, 90% mLCx, 100% OM2, 100% RPDA; LIMA-LAD patent; 100% occ of SVG OM2,OM3,PDA; med mgmt. e.) LHC 01/19/18 - patent LIMA-LAD; other grafts occ; not amenable to PCI/further bypass.   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 LV systolic dysfunction; LVEF 35-40%.   History of hiatal hernia    History of shingles 2013   Hyperlipemia    Hyperplastic polyps of stomach    a.) Bx on 07/24/2020 --> well differentiated NET; WHO grade I.   Hypertension    NSTEMI (non-ST elevated myocardial infarction) (HCC) 05/02/2013   a.) LHC 05/03/2013 --> 50% LM, 100% LAD, 75% mLCx, 100% dLCx, 60% mRCA; patient LIMA-LAD, occluded SVG-D1 and SVG-PDA. SVG-OM with extensive thrombus and distal 99% lesion. HIGH RISK PCI performed at [facility] with placement of DES x 3 to 2 distinct areas of SVG with filter protection.   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 No intervention; medical management.   Valvular regurgitation    a.) TTE 02/22/2020 --> LVEF 35-40%; mild panvalvular.    Past Surgical History:  Procedure Laterality Date   ABDOMINAL AORTA STENT  BIOPSY  11/05/2021   Procedure: BIOPSY;  Surgeon: Wilhelmenia Aloha Raddle., MD;  Location: [facility];  Service: Gastroenterology;;   CHOLECYSTECTOMY     COLONOSCOPY  11/24/2009   COLONOSCOPY N/A 10/09/2020   Procedure: COLONOSCOPY;  Surgeon: Wilhelmenia Aloha Raddle., MD;  Location: [facility];  Service: Gastroenterology;  Laterality: N/A;   COLONOSCOPY WITH PROPOFOL  N/A 07/24/2020   Procedure: COLONOSCOPY WITH PROPOFOL ;  Surgeon: Toledo, Ladell POUR, MD;  Location: [facility];  Service: Gastroenterology;  Laterality: N/A;   CORONARY ANGIOPLASTY WITH STENT PLACEMENT Left 05/03/2013   Procedure: CORONARY ANGIOPLASTY WITH STENT PLACEMENT (DES x 3 to SVG-OM graft); Location: [facility]   CORONARY ARTERY BYPASS GRAFT N/A 01/21/2002   Procedure: 4V CABG (LIMA-LAD, SVG-D1, SVG-RCA, SVG-OM); Location: [facility]   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   Procedure: DUPUYTREN CONTRACTURE RELEASE LEFT LONG FINGER;  Surgeon: Edie Norleen PARAS, MD;  Location: [facility];  Service: Orthopedics;  Laterality: Left;   DUPUYTREN CONTRACTURE RELEASE Right 10/18/2020   Procedure: DUPUYTREN CONTRACTURE RELEASE;  Surgeon: Edie Norleen PARAS, MD;  Location: [facility];  Service: Orthopedics;  Laterality: Right;   ENDOVASCULAR STENT GRAFT (AAA) N/A 09/24/2017   Procedure: ENDOVASCULAR REPAIR/STENT GRAFT (27 mm x 12 cm RIGHT iliac limb extension to distal CIA);  Surgeon: Marea Selinda RAMAN, MD;  Location: [facility];  Service: Cardiovascular;  Laterality: N/A;    ESOPHAGOGASTRODUODENOSCOPY  05/26/02  03/23/12   ESOPHAGOGASTRODUODENOSCOPY (EGD) WITH PROPOFOL  N/A 10/28/2016   Procedure: ESOPHAGOGASTRODUODENOSCOPY (EGD) WITH PROPOFOL ;  Surgeon: Viktoria Lamar DASEN, MD;  Location: [facility];  Service: Endoscopy;  Laterality: N/A;   ESOPHAGOGASTRODUODENOSCOPY (EGD) WITH PROPOFOL  N/A 11/05/2021   Procedure: ESOPHAGOGASTRODUODENOSCOPY (EGD) WITH PROPOFOL ;  Surgeon: Wilhelmenia Aloha Raddle., MD;  Location: [facility];  Service: Gastroenterology;  Laterality: N/A;   EUS N/A 10/09/2020   Procedure: LOWER ENDOSCOPIC ULTRASOUND (EUS);  Surgeon: Wilhelmenia Aloha Raddle., MD;  Location: [facility];  Service: Gastroenterology;  Laterality: N/A;   EUS N/A 11/05/2021   Procedure: LOWER ENDOSCOPIC ULTRASOUND (EUS);  Surgeon: Wilhelmenia Aloha Raddle., MD;  Location: [facility];  Service: Gastroenterology;  Laterality: N/A;   EUS N/A 11/11/2022   Procedure: LOWER ENDOSCOPIC ULTRASOUND (EUS);  Surgeon: Wilhelmenia Aloha Raddle., MD;  Location: [facility];  Service: Gastroenterology;  Laterality: N/A;   FLEXIBLE SIGMOIDOSCOPY N/A 11/05/2021   Procedure: FLEXIBLE SIGMOIDOSCOPY;  Surgeon: Wilhelmenia Aloha Raddle., MD;  Location: [facility];  Service: Gastroenterology;  Laterality: N/A;   FLEXIBLE SIGMOIDOSCOPY N/A 11/11/2022   Procedure: FLEXIBLE SIGMOIDOSCOPY;  Surgeon: Wilhelmenia Aloha Raddle., MD;  Location: [facility];  Service: Gastroenterology;  Laterality: N/A;   HEMOSTASIS CLIP PLACEMENT  11/05/2021   Procedure: HEMOSTASIS CLIP PLACEMENT;  Surgeon: Wilhelmenia Aloha Raddle., MD;  Location: [facility];  Service: Gastroenterology;;   INCISION AND DRAINAGE ABSCESS N/A 06/14/2020   Procedure: INCISION AND DRAINAGE ABSCESS;  Surgeon: Rodolph Romano, MD;  Location: [facility];  Service: General;  Laterality: N/A;   INGUINAL HERNIA REPAIR Left    LEFT HEART CATH AND CORONARY ANGIOGRAPHY N/A 01/19/2018   Procedure: LEFT HEART CATH AND CORONARY ANGIOGRAPHY;  Surgeon:  Bosie Vinie LABOR, MD;  Location: [facility];  Service: Cardiovascular;  Laterality: N/A;   LEFT HEART CATH AND CORONARY ANGIOGRAPHY Left 11/14/1995   Procedure: CARDIAC CATHETERIZATION (PTCA); Location: [facility]; Surgeon: Miquel Ellen, MD   LEFT HEART CATH AND CORS/GRAFTS ANGIOGRAPHY Left 07/30/2014   Procedure: CARDIAC CATHETERIZATION; Location: [facility]   PERIPHERAL VASCULAR CATHETERIZATION N/A 09/06/2014   Procedure: IVC Filter Removal;  Surgeon: Cordella KANDICE Shawl, MD;  Location: [facility];  Service: Cardiovascular;  Laterality: N/A;   POLYPECTOMY  10/09/2020   Procedure: POLYPECTOMY;  Surgeon: Wilhelmenia Aloha Raddle., MD;  Location: [facility];  Service: Gastroenterology;;   POLYPECTOMY  11/05/2021   Procedure: POLYPECTOMY;  Surgeon: Wilhelmenia Aloha Raddle., MD;  Location: [facility];  Service: Gastroenterology;;   POLYPECTOMY  11/11/2022   Procedure: POLYPECTOMY;  Surgeon: Wilhelmenia Aloha Raddle., MD;  Location: [facility];  Service: Gastroenterology;;   SUBMUCOSAL LIFTING INJECTION  10/09/2020   Procedure: SUBMUCOSAL LIFTING INJECTION;  Surgeon: Wilhelmenia Aloha Raddle., MD;  Location: [facility];  Service: Gastroenterology;;   SUBMUCOSAL TATTOO INJECTION  11/05/2021   Procedure: SUBMUCOSAL TATTOO INJECTION;  Surgeon: Wilhelmenia Aloha Raddle., MD;  Location: [facility];  Service: Gastroenterology;;   TRANSURETHRAL RESECTION OF PROSTATE N/A 02/20/2015   Procedure: TRANSURETHRAL RESECTION OF THE PROSTATE (TURP);  Surgeon: Rosina Riis, MD;  Location: [facility];  Service: Urology;  Laterality: N/A;     Social History   Tobacco Use   Smoking status: Some Days    Current packs/day: 0.01    Average packs/day: (0.2 ttl pk-yrs)    Types: Cigarettes   Smokeless tobacco: Never   Tobacco comments:    sometimes 2-3 a day  Vaping Use   Vaping status: Never Used  Substance Use Topics   Alcohol  use: Yes    Alcohol /week: 1.0 - 2.0 standard drink of alcohol     Types: 1  - 2 Shots of liquor per week    Comment: casual   Drug use: No      Family History  Problem Relation Age of Onset   Cancer Mother 73       BREAST   Diabetes Mother    Coronary artery disease Father    Heart attack Father    Prostate cancer Brother    Bladder Cancer Neg Hx    Kidney cancer Neg Hx      Allergies  Allergen Reactions   Ace Inhibitors Other (See Comments)    Other reaction(s): Cough   Amoxicillin Rash    Has patient had a PCN reaction causing immediate rash, facial/tongue/throat swelling, SOB or lightheadedness with hypotension: Yes Has patient had a PCN reaction causing severe rash involving mucus membranes or skin necrosis: Unknown Has patient had a PCN reaction that required hospitalization: Unknown Has patient had a PCN reaction occurring within the last 10 years: No If all of the above answers are NO, then may proceed with Cephalosporin use.   Niacin Rash   Penicillins Rash    Has patient had a PCN reaction causing immediate rash, facial/tongue/throat swelling, SOB or lightheadedness with hypotension: Yes Has patient had a PCN reaction causing severe rash involving mucus membranes or skin necrosis: Unknown Has patient had a PCN reaction that required hospitalization: Unknown Has patient had a PCN reaction occurring within the last 10 years: No If all of the above answers are NO, then may proceed with Cephalosporin use.   Pravastatin Other (See Comments)    Other reaction(s): Muscle Pain   Rosuvastatin Other (See Comments)    Other reaction(s): Other (See Comments) GI bleed   Simvastatin Other (See Comments)    Other reaction(s): Muscle Pain     REVIEW OF SYSTEMS (Negative unless checked)   Constitutional: [] Weight loss  [] Fever  [] Chills Cardiac: [] Chest pain   [] Chest pressure   [] Palpitations   [] Shortness of breath when laying flat   [] Shortness of breath at rest   [] Shortness of breath with exertion. Vascular:  [  x]Pain in legs with walking    [x] Pain in legs at rest   [] Pain in legs when laying flat   [x] Claudication   [] Pain in feet when walking  [] Pain in feet at rest  [] Pain in feet when laying flat   [] History of DVT   [] Phlebitis   [] Swelling in legs   [] Varicose veins   [] Non-healing ulcers Pulmonary:   [] Uses home oxygen   [] Productive cough   [] Hemoptysis   [] Wheeze  [] COPD   [] Asthma Neurologic:  [] Dizziness  [] Blackouts   [] Seizures   [] History of stroke   [] History of TIA  [] Aphasia   [] Temporary blindness   [] Dysphagia   [] Weakness or numbness in arms   [] Weakness or numbness in legs Musculoskeletal:  [x] Arthritis   [] Joint swelling   [x] Joint pain   [] Low back pain Hematologic:  [] Easy bruising  [] Easy bleeding   [] Hypercoagulable state   [] Anemic   Gastrointestinal:  [] Blood in stool   [] Vomiting blood  [] Gastroesophageal reflux/heartburn   [] Abdominal pain Genitourinary:  [] Chronic kidney disease   [] Difficult urination  [] Frequent urination  [] Burning with urination   [] Hematuria Skin:  [] Rashes   [] Ulcers   [] Wounds Psychological:  [] History of anxiety   []  History of major depression.  Physical Examination  BP (!) 147/85 (BP Location: Left Arm)   Pulse 65   Resp 18   Wt 200 lb 9.6 oz (91 kg)   BMI 27.21 kg/m  Gen:  WD/WN, NAD Head: [place]/AT, No temporalis wasting. Ear/Nose/Throat: Hearing grossly intact, nares w/o erythema or drainage Eyes: Conjunctiva clear. Sclera non-icteric Neck: Supple.  Trachea midline Pulmonary:  Good air movement, no use of accessory muscles.  Cardiac: RRR, no JVD Vascular:  Vessel Right Left  Radial Palpable Palpable               Musculoskeletal: M/S 5/5 throughout.  No deformity or atrophy. No LE edema. Neurologic: Sensation grossly intact in extremities.  Symmetrical.  Speech is fluent.  Psychiatric: Judgment intact, Mood & affect appropriate for pt's clinical situation. Dermatologic: No rashes or ulcers noted.  No cellulitis or open wounds.      Labs Recent Results  (from the past 2160 hours)  NM PET CT CARDIAC PERFUSION MULTI W/ABSOLUTE BLOODFLOW     Status: None   Collection Time: 09/18/23  1:50 PM  Result Value Ref Range   Rest Nuclear Isotope Dose 24.4 mCi   Stress Nuclear Isotope Dose 24.4 mCi   Rest HR 55.0 bpm   Rest BP 127/72 mmHg   Peak HR 71 bpm   Peak BP 112/58 mmHg   SSS 20.0    SRS 14.0    TID 1.11    Nuc Stress EF 42 %   Nuc Rest EF 43 %   ST Depression (mm) 0 mm  VAS US  ABI WITH/WO TBI     Status: None   Collection Time: 09/29/23  9:11 AM  Result Value Ref Range   Right ABI >1.0 [place]    Left ABI >1.0 [place]   Lactate dehydrogenase     Status: None   Collection Time: 10/13/23 10:45 AM  Result Value Ref Range   LDH 141 98 - 192 U/L    Comment: Performed at [facility], [address] [place], [place] [address]  Flow cytometry panel-leukemia/lymphoma work-up     Status: None   Collection Time: 10/13/23 10:45 AM  Result Value Ref Range   PATH INTERP XXX-IMP Comment     Comment: (NOTE)  A CD5 positive, FMC7 positive and CD23 negative monoclonal B cell population detected, positive for CD19, CD20 and CD22, with CD38 expressed by 22% of clonal B cell, representing 29% of leukocytes, <5,000/uL, see comment.    ANNOTATION COMMENT IMP Comment     Comment: (NOTE) The immunophenotype detected may be seen in the setting of mantle cell lymphoma, although atypical chronic lymphocytic leukemia/small lymphocytic lymphoma (CLL/SLL) remains in the differential diagnosis. If the patient is not known to have a B-cell leukemia or lymphoma, further investigation with FISH CLL (including IGH/CCND1 characteristic of MCL) is recommended for definitive classification if clinically indicated. In the absence of B-cell lymphoma, small (<5,000/uL) clonal B-cell populations in the blood are classified as monoclonal B-cell lymphocytosis.  Clinical correlation is required. Multiple previous flow cytometry results have been reviewed. The  most recent one showed similar findings.    CLINICAL INFO Comment     Comment: (NOTE) Accompanying CBC dated 10-13-23 shows: WBC count 5.9, Neu 2.8, Lym 2.6, Mon 0.5.    Specimen Type Comment     Comment: Peripheral blood   ASSESSMENT OF LEUKOCYTES Comment     Comment: (NOTE) A CD5 positive, FMC7positive and CD23 negative monoclonal B cell population is detected with kappa light chain restriction,  representing 29% of leukocytes and 97% of B cells, <5,000/uL. CD38 is expressed by 22% of clonal B cells. There is no loss of, or aberrant expression of, the pan T cell antigens to suggest a neoplastic T cell process. CD4:CD8 ratio 2.7 No circulating blasts are detected. There is no immunophenotypic  evidence of abnormal myeloid maturation.    % Viable Cells Comment     Comment: (NOTE) 68% Cell viability in this sample is sufficient for analysis but is less than optimal.    Immunophenotypic Profile 29% of total cells (Phenotype below)     Comment: Comment Abnormal cell population: present    ANALYSIS AND GATING STRATEGY Comment     Comment: (NOTE) 8 color analysis with CD45/SSC  Technical-Analysis performed at [facility], [employer], [address] [place] [address], Director: Aniceto Christen, MDPhD, [phone]    IMMUNOPHENOTYPING STUDY Comment     Comment: (NOTE) CD2       (-)            CD3       (-) CD4       (-)            CD5       (+) CD7       (-)            CD8       (-) CD10      (-)            CD11b     (-) CD11c     (+)            CD13      (-) CD14      (-)            CD15      (-) CD16      (-)            CD19      (+) Dim CD20      (+) Moderate   CD22      (+) Moderate CD23      (-)            CD33      (-) CD34      (-)  CD38      See Text CD45      (+)            CD56      (-) CD57      (-)            CD64      (-) CD103     (-)            CD117     (-) FMC-7     (+)            HLA-DR    (+) KAPPA     (+)             LAMBDA    (-)    PATHOLOGIST NAME Comment     Comment: Rumalda Nearing, M.D.   COMMENT: Comment     Comment: (NOTE) Each antibody in this assay was utilized to assess for potential abnormalities of studied cell populations or to characterize identified abnormalities. This test was developed and its performance characteristics determined by [facility].  It has not been cleared or approved by the U.S. Food and Drug Administration. The FDA has determined that such clearance or approval is not necessary. This test is used for clinical purposes.  It should not be regarded as investigational or for research. Performed At: [facility] [place] [address] [place], [place] [id number] Loran Gales MDPhD Ey:[phone] Performed At: [facility] [place] [address] [place], [place] [id number] Loran Gales MDPhD Ey:[phone]   Hepatic function panel     Status: Abnormal   Collection Time: 10/13/23 10:45 AM  Result Value Ref Range   Total Protein 6.7 6.5 - 8.1 g/dL   Albumin 4.2 3.5 - 5.0 g/dL   AST 26 15 - 41 U/L   ALT 18 0 - 44 U/L   Alkaline Phosphatase 86 38 - 126 U/L   Total Bilirubin 2.4 (H) 0.0 - 1.2 mg/dL   Bilirubin, Direct 0.3 (H) 0.0 - 0.2 mg/dL   Indirect Bilirubin 2.1 (H) 0.3 - 0.9 mg/dL    Comment: Performed at [facility], [address] [place], [place] [address]  Basic Metabolic Panel - Cancer Center Only     Status: Abnormal   Collection Time: 10/13/23 10:45 AM  Result Value Ref Range   Sodium 140 135 - 145 mmol/L   Potassium 3.2 (L) 3.5 - 5.1 mmol/L   Chloride 105 98 - 111 mmol/L   CO2 27 22 - 32 mmol/L   Glucose, Bld 146 (H) 70 - 99 mg/dL    Comment: Glucose reference range applies only to samples taken after fasting for at least 8 hours.   BUN 13 8 - 23 mg/dL   Creatinine 9.21 9.38 - 1.24 mg/dL   Calcium  8.7 (L) 8.9 - 10.3 mg/dL   GFR, Estimated >39 >39 mL/min    Comment: (NOTE) Calculated using the CKD-EPI Creatinine Equation (2021)    Anion gap 8 5 -  15    Comment: Performed at [facility], [address] [place], [place] [address]  CBC with Differential (Cancer Center Only)     Status: Abnormal   Collection Time: 10/13/23 10:45 AM  Result Value Ref Range   WBC Count 5.8 4.0 - 10.5 K/uL   RBC 4.71 4.22 - 5.81 MIL/uL   Hemoglobin 14.7 13.0 - 17.0 g/dL   HCT 56.1 60.9 - 47.9 %   MCV 93.0 80.0 - 100.0 fL   MCH 31.2  26.0 - 34.0 pg   MCHC 33.6 30.0 - 36.0 g/dL   RDW 84.8 88.4 - 84.4 %   Platelet Count 141 (L) 150 - 400 K/uL   nRBC 0.0 0.0 - 0.2 %   Neutrophils Relative % 47 %   Neutro Abs 2.7 1.7 - 7.7 K/uL   Lymphocytes Relative 41 %   Lymphs Abs 2.4 0.7 - 4.0 K/uL   Monocytes Relative 11 %   Monocytes Absolute 0.6 0.1 - 1.0 K/uL   Eosinophils Relative 1 %   Eosinophils Absolute 0.0 0.0 - 0.5 K/uL   Basophils Relative 0 %   Basophils Absolute 0.0 0.0 - 0.1 K/uL   WBC Morphology See Note     Comment: Abnormal lymphocytes present SMUDGE CELLS    RBC Morphology MORPHOLOGY UNREMARKABLE    Smear Review Normal platelet morphology    Immature Granulocytes 0 %   Abs Immature Granulocytes 0.01 0.00 - 0.07 K/uL    Comment: Performed at [facility], [address] [place], [place] [address]  Comprehensive metabolic panel     Status: Abnormal   Collection Time: 11/20/23 10:25 AM  Result Value Ref Range   Sodium 142 135 - 145 mmol/L   Potassium 3.7 3.5 - 5.1 mmol/L   Chloride 105 98 - 111 mmol/L   CO2 25 22 - 32 mmol/L   Glucose, Bld 157 (H) 70 - 99 mg/dL    Comment: Glucose reference range applies only to samples taken after fasting for at least 8 hours.   BUN 14 8 - 23 mg/dL   Creatinine, Ser 9.12 0.61 - 1.24 mg/dL   Calcium  9.0 8.9 - 10.3 mg/dL   Total Protein 6.9 6.5 - 8.1 g/dL   Albumin 4.6 3.5 - 5.0 g/dL   AST 29 15 - 41 U/L   ALT 27 0 - 44 U/L   Alkaline Phosphatase 99 38 - 126 U/L   Total Bilirubin 1.4 (H) 0.0 - 1.2 mg/dL   GFR, Estimated >39 >39 mL/min    Comment: (NOTE) Calculated using the CKD-EPI  Creatinine Equation (2021)    Anion gap 12 5 - 15    Comment: Performed at [facility], [address] [place], [place] [address]  CBC     Status: Abnormal   Collection Time: 11/20/23 10:25 AM  Result Value Ref Range   WBC 8.4 4.0 - 10.5 K/uL   RBC 5.46 4.22 - 5.81 MIL/uL   Hemoglobin 16.6 13.0 - 17.0 g/dL   HCT 48.7 60.9 - 47.9 %   MCV 93.8 80.0 - 100.0 fL   MCH 30.4 26.0 - 34.0 pg   MCHC 32.4 30.0 - 36.0 g/dL   RDW 84.8 88.4 - 84.4 %   Platelets 128 (L) 150 - 400 K/uL   nRBC 0.0 0.0 - 0.2 %    Comment: Performed at [facility], [address] [place], [place] [address]  Urinalysis, Routine w reflex microscopic -Urine, Clean Catch     Status: Abnormal   Collection Time: 11/20/23 10:25 AM  Result Value Ref Range   Color, Urine YELLOW (A) YELLOW   APPearance CLEAR (A) CLEAR   Specific Gravity, Urine 1.027 1.005 - 1.030   pH 5.0 5.0 - 8.0   Glucose, UA >=500 (A) NEGATIVE mg/dL   Hgb urine dipstick NEGATIVE NEGATIVE   Bilirubin Urine NEGATIVE NEGATIVE   Ketones, ur NEGATIVE NEGATIVE mg/dL   Protein, ur 899 (A) NEGATIVE mg/dL   Nitrite NEGATIVE NEGATIVE   Leukocytes,Ua  NEGATIVE NEGATIVE   RBC / HPF 0 0 - 5 RBC/hpf   WBC, UA 0-5 0 - 5 WBC/hpf   Bacteria, UA NONE SEEN NONE SEEN   Squamous Epithelial / HPF 0-5 0 - 5 /HPF   Mucus PRESENT    Hyaline Casts, UA PRESENT     Comment: Performed at [facility], [address] [place], [place] [address]  Sedimentation rate     Status: None   Collection Time: 11/20/23  4:31 PM  Result Value Ref Range   Sed Rate 1 0 - 20 mm/hr    Comment: Performed at [facility], [address], [place], [place] [address]    Radiology MR BRAIN WO CONTRAST Result Date: 11/20/2023 EXAM: MRI BRAIN WITHOUT CONTRAST 11/20/2023 03:22:34 PM TECHNIQUE: Multiplanar multisequence MRI of the head/brain was performed without the administration of intravenous contrast. COMPARISON: Head CT 11/20/2023 and MRI  09/03/2022. CLINICAL HISTORY: Neuro deficit, acute, stroke suspected; Acute episode of dizziness, exclude stroke. Persistent headache for over a month. FINDINGS: BRAIN AND VENTRICLES: There is no evidence of an acute infarct, intracranial hemorrhage, mass, midline shift, hydrocephalus, or extra-axial fluid collection. A small chronic left parietal cortical/subcortical infarct is again noted. Patchy T2 hyperintensities elsewhere in the cerebral white matter bilaterally are similar to the prior MRI and are nonspecific but compatible with mild to moderate chronic small vessel ischemic disease. A tiny chronic left cerebellar infarct is unchanged. There is mild cerebral atrophy. Major intracranial vascular flow voids are preserved. ORBITS: No acute abnormality. SINUSES AND MASTOIDS: No acute abnormality. BONES AND SOFT TISSUES: Normal marrow signal. No acute soft tissue abnormality. IMPRESSION: 1. No acute intracranial abnormality. 2. Mild to moderate chronic small vessel ischemic disease. 3. 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 Electronically signed by: Dasie Hamburg MD 11/20/2023 03:48 PM EST RP Workstation: [id number]   CT HEAD WO CONTRAST ( ) Result Date: 11/20/2023 EXAM: CT HEAD WITHOUT CONTRAST 11/20/2023 10:55:27 AM TECHNIQUE: CT of the head was performed without the administration of intravenous contrast. Automated exposure control, iterative reconstruction, and/or weight based adjustment of the mA/kV was utilized to reduce the radiation dose to as low as reasonably achievable. COMPARISON: Head CT 08/30/2021 and MRI 09/03/2022. CLINICAL HISTORY: Syncope/presyncope, cerebrovascular cause suspected, headache, blurred vision. FINDINGS: BRAIN AND VENTRICLES: There is no evidence of an acute infarct, intracranial hemorrhage, mass, midline shift, hydrocephalus, or extra-axial fluid collection. There is mild cerebral atrophy. Patchy hypodensities in the cerebral white matter are similar to the prior  CT and nonspecific but compatible with mild to moderate chronic small vessel ischemic disease. 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 Calcified atherosclerosis at the skull base. ORBITS: No acute abnormality. SINUSES: No acute abnormality. SOFT TISSUES AND SKULL: No acute soft tissue abnormality. No skull fracture. IMPRESSION: 1. No acute intracranial abnormality. 2. Mild to moderate chronic small vessel ischemic disease. Electronically signed by: Dasie Hamburg MD 11/20/2023 11:16 AM EST RP Workstation: [id number]    Assessment/Plan  Thoracic aortic aneurysm (TAA) I have independently reviewed his CT scan of the chest and he does have a 4.5 cm ascending thoracic aortic aneurysm.  This is below the size for prophylactic repair, but it will need to be monitored.  I will plan to get a CT of the chest when he is scheduled to come back for his ABIs and abdominal studies next September.  We can go ahead and just get a CT scan of the chest abdomen and pelvis to evaluate all the aneurysmal  issues at 1 time.  We are still getting ABIs to check his PAD.  He voices understanding and is agreeable with our plan of care.  Peripheral vascular disease Stable at last check.  To be checked again next year.  Abdominal aortic aneurysm (AAA) without rupture Status post endovascular repair.  We will get CT of the chest abdomen pelvis next September.  Benign essential HTN blood pressure control important in reducing the progression of atherosclerotic disease and aneurysmal growth. On appropriate oral medications.     Type 2 diabetes mellitus (HCC) blood glucose control important in reducing the progression of atherosclerotic disease. Also, involved in wound healing. On appropriate medications.    Selinda Gu, MD  12/02/2023 12:32 PM    This note was created with Dragon medical transcription system.  Any errors from dictation are purely unintentional

## 2024-01-13 ENCOUNTER — Ambulatory Visit
Admission: RE | Admit: 2024-01-13 | Discharge: 2024-01-13 | Disposition: A | Discharge status: Home / Self Care | Source: Ambulatory Visit | Attending: Nurse Practitioner | Admitting: Nurse Practitioner

## 2024-01-13 DIAGNOSIS — I700 Atherosclerosis of aorta: Secondary | ICD-10-CM | POA: Diagnosis not present

## 2024-01-13 DIAGNOSIS — I5022 Chronic systolic (congestive) heart failure: Secondary | ICD-10-CM | POA: Insufficient documentation

## 2024-01-13 DIAGNOSIS — I1A0 Resistant hypertension: Secondary | ICD-10-CM | POA: Diagnosis present

## 2024-01-13 DIAGNOSIS — I110 Hypertensive heart disease with heart failure: Secondary | ICD-10-CM | POA: Diagnosis not present

## 2024-01-20 ENCOUNTER — Other Ambulatory Visit: Payer: Self-pay | Admitting: Internal Medicine

## 2024-01-20 DIAGNOSIS — R109 Unspecified abdominal pain: Secondary | ICD-10-CM

## 2024-01-20 DIAGNOSIS — I5022 Chronic systolic (congestive) heart failure: Secondary | ICD-10-CM

## 2024-01-20 DIAGNOSIS — I25810 Atherosclerosis of coronary artery bypass graft(s) without angina pectoris: Secondary | ICD-10-CM

## 2024-01-23 ENCOUNTER — Ambulatory Visit: Admission: RE | Admit: 2024-01-23 | Source: Ambulatory Visit

## 2024-01-30 ENCOUNTER — Ambulatory Visit
Admission: RE | Admit: 2024-01-30 | Discharge: 2024-01-30 | Disposition: A | Source: Ambulatory Visit | Attending: Internal Medicine | Admitting: Internal Medicine

## 2024-01-30 DIAGNOSIS — R109 Unspecified abdominal pain: Secondary | ICD-10-CM | POA: Insufficient documentation

## 2024-01-30 DIAGNOSIS — I5022 Chronic systolic (congestive) heart failure: Secondary | ICD-10-CM | POA: Insufficient documentation

## 2024-01-30 DIAGNOSIS — I25810 Atherosclerosis of coronary artery bypass graft(s) without angina pectoris: Secondary | ICD-10-CM | POA: Insufficient documentation

## 2024-01-30 MED ORDER — IOHEXOL 350 MG/ML SOLN
75.0000 mL | Freq: Once | INTRAVENOUS | Status: AC | PRN
  Administered 2024-01-30: 75 mL via INTRAVENOUS

## 2024-04-12 ENCOUNTER — Ambulatory Visit: Admitting: Oncology

## 2024-04-12 ENCOUNTER — Other Ambulatory Visit

## 2024-10-01 ENCOUNTER — Other Ambulatory Visit (INDEPENDENT_AMBULATORY_CARE_PROVIDER_SITE_OTHER)

## 2024-10-01 ENCOUNTER — Ambulatory Visit (INDEPENDENT_AMBULATORY_CARE_PROVIDER_SITE_OTHER): Admitting: Vascular Surgery

## 2024-10-01 ENCOUNTER — Encounter (INDEPENDENT_AMBULATORY_CARE_PROVIDER_SITE_OTHER)
